# Patient Record
Sex: Female | Born: 1940 | Race: Black or African American | Hispanic: No | Marital: Married | Smoking: Current every day smoker
Health system: Southern US, Community
[De-identification: ages and names within clinical notes are randomized; demographics above are authoritative.]

## PROBLEM LIST (undated history)

## (undated) DIAGNOSIS — M542 Cervicalgia: Secondary | ICD-10-CM

## (undated) DIAGNOSIS — M6282 Rhabdomyolysis: Secondary | ICD-10-CM

## (undated) DIAGNOSIS — N179 Acute kidney failure, unspecified: Secondary | ICD-10-CM

## (undated) DIAGNOSIS — K319 Disease of stomach and duodenum, unspecified: Secondary | ICD-10-CM

## (undated) DIAGNOSIS — M549 Dorsalgia, unspecified: Secondary | ICD-10-CM

## (undated) DIAGNOSIS — I10 Essential (primary) hypertension: Secondary | ICD-10-CM

## (undated) DIAGNOSIS — I219 Acute myocardial infarction, unspecified: Secondary | ICD-10-CM

## (undated) DIAGNOSIS — C539 Malignant neoplasm of cervix uteri, unspecified: Secondary | ICD-10-CM

## (undated) DIAGNOSIS — E785 Hyperlipidemia, unspecified: Secondary | ICD-10-CM

## (undated) DIAGNOSIS — Z923 Personal history of irradiation: Secondary | ICD-10-CM

## (undated) DIAGNOSIS — I509 Heart failure, unspecified: Secondary | ICD-10-CM

## (undated) DIAGNOSIS — J449 Chronic obstructive pulmonary disease, unspecified: Secondary | ICD-10-CM

## (undated) DIAGNOSIS — I2510 Atherosclerotic heart disease of native coronary artery without angina pectoris: Secondary | ICD-10-CM

## (undated) DIAGNOSIS — K219 Gastro-esophageal reflux disease without esophagitis: Secondary | ICD-10-CM

## (undated) DIAGNOSIS — G3184 Mild cognitive impairment, so stated: Secondary | ICD-10-CM

## (undated) HISTORY — PX: ABDOMINAL HYSTERECTOMY: SHX81

## (undated) HISTORY — PX: SHOULDER SURGERY: SHX246

## (undated) HISTORY — DX: Disease of stomach and duodenum, unspecified: K31.9

## (undated) HISTORY — DX: Hyperlipidemia, unspecified: E78.5

## (undated) HISTORY — PX: KNEE SURGERY: SHX244

## (undated) HISTORY — DX: Malignant neoplasm of cervix uteri, unspecified: C53.9

## (undated) HISTORY — DX: Cervicalgia: M54.2

## (undated) HISTORY — PX: CHOLECYSTECTOMY: SHX55

## (undated) HISTORY — DX: Dorsalgia, unspecified: M54.9

## (undated) HISTORY — DX: Essential (primary) hypertension: I10

---

## 1998-03-09 ENCOUNTER — Emergency Department (HOSPITAL_COMMUNITY): Admission: EM | Admit: 1998-03-09 | Discharge: 1998-03-09 | Payer: Self-pay | Admitting: Emergency Medicine

## 1998-12-19 ENCOUNTER — Emergency Department (HOSPITAL_COMMUNITY): Admission: EM | Admit: 1998-12-19 | Discharge: 1998-12-19 | Payer: Self-pay | Admitting: Emergency Medicine

## 1998-12-19 ENCOUNTER — Encounter: Payer: Self-pay | Admitting: Emergency Medicine

## 1999-01-11 ENCOUNTER — Encounter: Payer: Self-pay | Admitting: Orthopedic Surgery

## 1999-01-12 ENCOUNTER — Observation Stay (HOSPITAL_COMMUNITY): Admission: RE | Admit: 1999-01-12 | Discharge: 1999-01-13 | Payer: Self-pay | Admitting: Orthopedic Surgery

## 1999-01-18 ENCOUNTER — Encounter: Payer: Self-pay | Admitting: Emergency Medicine

## 1999-01-18 ENCOUNTER — Emergency Department (HOSPITAL_COMMUNITY): Admission: EM | Admit: 1999-01-18 | Discharge: 1999-01-18 | Payer: Self-pay | Admitting: Emergency Medicine

## 1999-06-16 HISTORY — PX: CARDIAC CATHETERIZATION: SHX172

## 1999-06-17 ENCOUNTER — Encounter: Payer: Self-pay | Admitting: Orthopedic Surgery

## 1999-06-17 ENCOUNTER — Ambulatory Visit: Admission: RE | Admit: 1999-06-17 | Discharge: 1999-06-17 | Payer: Self-pay | Admitting: Orthopedic Surgery

## 1999-06-27 ENCOUNTER — Ambulatory Visit (HOSPITAL_COMMUNITY): Admission: RE | Admit: 1999-06-27 | Discharge: 1999-06-27 | Payer: Self-pay | Admitting: *Deleted

## 1999-08-02 ENCOUNTER — Encounter: Payer: Self-pay | Admitting: Orthopedic Surgery

## 1999-08-09 ENCOUNTER — Inpatient Hospital Stay (HOSPITAL_COMMUNITY): Admission: RE | Admit: 1999-08-09 | Discharge: 1999-08-13 | Payer: Self-pay | Admitting: Orthopedic Surgery

## 1999-09-05 ENCOUNTER — Encounter: Admission: RE | Admit: 1999-09-05 | Discharge: 1999-10-13 | Payer: Self-pay | Admitting: Orthopedic Surgery

## 2000-10-25 ENCOUNTER — Encounter (INDEPENDENT_AMBULATORY_CARE_PROVIDER_SITE_OTHER): Payer: Self-pay | Admitting: Specialist

## 2000-10-25 ENCOUNTER — Ambulatory Visit (HOSPITAL_COMMUNITY): Admission: RE | Admit: 2000-10-25 | Discharge: 2000-10-25 | Payer: Self-pay | Admitting: *Deleted

## 2000-11-09 ENCOUNTER — Emergency Department (HOSPITAL_COMMUNITY): Admission: EM | Admit: 2000-11-09 | Discharge: 2000-11-09 | Payer: Self-pay | Admitting: Emergency Medicine

## 2000-11-10 ENCOUNTER — Encounter: Payer: Self-pay | Admitting: Emergency Medicine

## 2001-01-31 ENCOUNTER — Emergency Department (HOSPITAL_COMMUNITY): Admission: EM | Admit: 2001-01-31 | Discharge: 2001-01-31 | Payer: Self-pay

## 2001-01-31 ENCOUNTER — Encounter: Payer: Self-pay | Admitting: Emergency Medicine

## 2001-10-30 ENCOUNTER — Encounter: Payer: Self-pay | Admitting: Cardiology

## 2001-10-30 ENCOUNTER — Encounter: Admission: RE | Admit: 2001-10-30 | Discharge: 2001-10-30 | Payer: Self-pay | Admitting: Cardiology

## 2002-06-02 ENCOUNTER — Ambulatory Visit (HOSPITAL_COMMUNITY): Admission: RE | Admit: 2002-06-02 | Discharge: 2002-06-02 | Payer: Self-pay | Admitting: Family Medicine

## 2002-12-31 ENCOUNTER — Encounter (INDEPENDENT_AMBULATORY_CARE_PROVIDER_SITE_OTHER): Payer: Self-pay | Admitting: Specialist

## 2002-12-31 ENCOUNTER — Ambulatory Visit (HOSPITAL_COMMUNITY): Admission: RE | Admit: 2002-12-31 | Discharge: 2002-12-31 | Payer: Self-pay | Admitting: *Deleted

## 2003-01-07 ENCOUNTER — Encounter (INDEPENDENT_AMBULATORY_CARE_PROVIDER_SITE_OTHER): Payer: Self-pay | Admitting: *Deleted

## 2003-01-07 ENCOUNTER — Encounter: Payer: Self-pay | Admitting: General Surgery

## 2003-01-07 ENCOUNTER — Inpatient Hospital Stay (HOSPITAL_COMMUNITY): Admission: AD | Admit: 2003-01-07 | Discharge: 2003-01-14 | Payer: Self-pay | Admitting: *Deleted

## 2003-01-08 ENCOUNTER — Encounter (INDEPENDENT_AMBULATORY_CARE_PROVIDER_SITE_OTHER): Payer: Self-pay | Admitting: Specialist

## 2003-05-16 HISTORY — PX: PARTIAL GASTRECTOMY: SHX6003

## 2004-05-03 ENCOUNTER — Encounter: Admission: RE | Admit: 2004-05-03 | Discharge: 2004-05-03 | Payer: Self-pay | Admitting: Internal Medicine

## 2004-09-15 ENCOUNTER — Encounter: Admission: RE | Admit: 2004-09-15 | Discharge: 2004-09-15 | Payer: Self-pay | Admitting: Internal Medicine

## 2005-03-07 ENCOUNTER — Encounter: Admission: RE | Admit: 2005-03-07 | Discharge: 2005-03-07 | Payer: Self-pay | Admitting: Internal Medicine

## 2005-11-10 ENCOUNTER — Encounter: Admission: RE | Admit: 2005-11-10 | Discharge: 2005-11-10 | Payer: Self-pay | Admitting: Internal Medicine

## 2006-01-20 ENCOUNTER — Emergency Department (HOSPITAL_COMMUNITY): Admission: EM | Admit: 2006-01-20 | Discharge: 2006-01-20 | Payer: Self-pay | Admitting: Emergency Medicine

## 2006-02-06 ENCOUNTER — Inpatient Hospital Stay (HOSPITAL_COMMUNITY): Admission: RE | Admit: 2006-02-06 | Discharge: 2006-02-11 | Payer: Self-pay | Admitting: Orthopedic Surgery

## 2006-02-07 ENCOUNTER — Ambulatory Visit: Payer: Self-pay | Admitting: Physical Medicine & Rehabilitation

## 2006-02-19 ENCOUNTER — Encounter: Payer: Self-pay | Admitting: Vascular Surgery

## 2006-02-19 ENCOUNTER — Ambulatory Visit (HOSPITAL_COMMUNITY): Admission: RE | Admit: 2006-02-19 | Discharge: 2006-02-19 | Payer: Self-pay | Admitting: Internal Medicine

## 2006-03-12 ENCOUNTER — Encounter: Admission: RE | Admit: 2006-03-12 | Discharge: 2006-03-12 | Payer: Self-pay | Admitting: Internal Medicine

## 2006-03-27 ENCOUNTER — Encounter: Admission: RE | Admit: 2006-03-27 | Discharge: 2006-03-27 | Payer: Self-pay | Admitting: Internal Medicine

## 2006-05-29 ENCOUNTER — Inpatient Hospital Stay (HOSPITAL_COMMUNITY): Admission: RE | Admit: 2006-05-29 | Discharge: 2006-06-01 | Payer: Self-pay | Admitting: Orthopedic Surgery

## 2006-05-30 ENCOUNTER — Encounter: Payer: Self-pay | Admitting: Vascular Surgery

## 2006-11-13 ENCOUNTER — Encounter: Admission: RE | Admit: 2006-11-13 | Discharge: 2006-11-13 | Payer: Self-pay | Admitting: Internal Medicine

## 2007-09-29 ENCOUNTER — Emergency Department (HOSPITAL_COMMUNITY): Admission: EM | Admit: 2007-09-29 | Discharge: 2007-09-29 | Payer: Self-pay | Admitting: Emergency Medicine

## 2007-11-03 ENCOUNTER — Emergency Department (HOSPITAL_COMMUNITY): Admission: EM | Admit: 2007-11-03 | Discharge: 2007-11-04 | Payer: Self-pay | Admitting: Emergency Medicine

## 2007-11-12 ENCOUNTER — Encounter: Admission: RE | Admit: 2007-11-12 | Discharge: 2007-11-12 | Payer: Self-pay | Admitting: Orthopedic Surgery

## 2007-11-26 ENCOUNTER — Encounter: Admission: RE | Admit: 2007-11-26 | Discharge: 2007-11-26 | Payer: Self-pay | Admitting: Orthopedic Surgery

## 2007-12-11 ENCOUNTER — Encounter: Admission: RE | Admit: 2007-12-11 | Discharge: 2007-12-11 | Payer: Self-pay | Admitting: Internal Medicine

## 2007-12-24 ENCOUNTER — Emergency Department (HOSPITAL_COMMUNITY): Admission: EM | Admit: 2007-12-24 | Discharge: 2007-12-24 | Payer: Self-pay | Admitting: Infectious Diseases

## 2007-12-27 ENCOUNTER — Ambulatory Visit (HOSPITAL_COMMUNITY): Admission: RE | Admit: 2007-12-27 | Discharge: 2007-12-27 | Payer: Self-pay | Admitting: Emergency Medicine

## 2008-02-11 ENCOUNTER — Inpatient Hospital Stay (HOSPITAL_COMMUNITY): Admission: RE | Admit: 2008-02-11 | Discharge: 2008-02-15 | Payer: Self-pay | Admitting: Internal Medicine

## 2008-04-29 ENCOUNTER — Ambulatory Visit: Payer: Self-pay | Admitting: Vascular Surgery

## 2008-04-29 ENCOUNTER — Encounter (INDEPENDENT_AMBULATORY_CARE_PROVIDER_SITE_OTHER): Payer: Self-pay | Admitting: Orthopedic Surgery

## 2008-04-29 ENCOUNTER — Ambulatory Visit: Admission: RE | Admit: 2008-04-29 | Discharge: 2008-04-29 | Payer: Self-pay | Admitting: Orthopedic Surgery

## 2009-06-03 ENCOUNTER — Encounter: Admission: RE | Admit: 2009-06-03 | Discharge: 2009-06-03 | Payer: Self-pay | Admitting: Orthopedic Surgery

## 2009-06-23 ENCOUNTER — Encounter: Admission: RE | Admit: 2009-06-23 | Discharge: 2009-06-23 | Payer: Self-pay | Admitting: Orthopedic Surgery

## 2010-03-14 ENCOUNTER — Emergency Department (HOSPITAL_COMMUNITY): Admission: EM | Admit: 2010-03-14 | Discharge: 2010-03-15 | Payer: Self-pay | Admitting: Emergency Medicine

## 2010-04-28 ENCOUNTER — Emergency Department (HOSPITAL_COMMUNITY)
Admission: EM | Admit: 2010-04-28 | Discharge: 2010-04-28 | Payer: Self-pay | Source: Home / Self Care | Admitting: Emergency Medicine

## 2010-04-29 ENCOUNTER — Ambulatory Visit (HOSPITAL_COMMUNITY)
Admission: RE | Admit: 2010-04-29 | Discharge: 2010-04-29 | Payer: Self-pay | Source: Home / Self Care | Attending: Internal Medicine | Admitting: Internal Medicine

## 2010-05-15 HISTORY — PX: NECK SURGERY: SHX720

## 2010-05-15 HISTORY — PX: BACK SURGERY: SHX140

## 2010-05-17 ENCOUNTER — Ambulatory Visit
Admission: RE | Admit: 2010-05-17 | Discharge: 2010-05-17 | Payer: Self-pay | Source: Home / Self Care | Attending: Specialist | Admitting: Specialist

## 2010-05-17 ENCOUNTER — Other Ambulatory Visit: Payer: Self-pay | Admitting: Specialist

## 2010-06-02 HISTORY — PX: TRANSTHORACIC ECHOCARDIOGRAM: SHX275

## 2010-06-15 ENCOUNTER — Encounter (HOSPITAL_COMMUNITY): Payer: Medicare Other | Attending: Specialist

## 2010-06-15 DIAGNOSIS — Z01812 Encounter for preprocedural laboratory examination: Secondary | ICD-10-CM | POA: Insufficient documentation

## 2010-06-17 ENCOUNTER — Inpatient Hospital Stay (HOSPITAL_COMMUNITY)
Admission: RE | Admit: 2010-06-17 | Discharge: 2010-06-20 | DRG: 473 | Disposition: A | Discharge status: Home Health | Payer: MEDICARE | Source: Ambulatory Visit | Attending: Specialist | Admitting: Specialist

## 2010-06-17 ENCOUNTER — Ambulatory Visit (HOSPITAL_COMMUNITY): Payer: MEDICARE

## 2010-06-17 DIAGNOSIS — L723 Sebaceous cyst: Secondary | ICD-10-CM | POA: Diagnosis present

## 2010-06-17 DIAGNOSIS — I10 Essential (primary) hypertension: Secondary | ICD-10-CM | POA: Diagnosis present

## 2010-06-17 DIAGNOSIS — M129 Arthropathy, unspecified: Secondary | ICD-10-CM | POA: Diagnosis present

## 2010-06-17 DIAGNOSIS — Z96659 Presence of unspecified artificial knee joint: Secondary | ICD-10-CM

## 2010-06-17 DIAGNOSIS — M4712 Other spondylosis with myelopathy, cervical region: Principal | ICD-10-CM | POA: Diagnosis present

## 2010-06-17 DIAGNOSIS — F17200 Nicotine dependence, unspecified, uncomplicated: Secondary | ICD-10-CM | POA: Diagnosis present

## 2010-06-17 LAB — PROTIME-INR
INR: 0.93 (ref 0.00–1.49)
Prothrombin Time: 12.7 seconds (ref 11.6–15.2)

## 2010-06-17 LAB — DIFFERENTIAL
Basophils Absolute: 0 10*3/uL (ref 0.0–0.1)
Basophils Relative: 0 % (ref 0–1)
Eosinophils Absolute: 0.1 10*3/uL (ref 0.0–0.7)
Eosinophils Relative: 2 % (ref 0–5)
Lymphocytes Relative: 32 % (ref 12–46)
Lymphs Abs: 2 10*3/uL (ref 0.7–4.0)
Monocytes Absolute: 0.7 10*3/uL (ref 0.1–1.0)
Monocytes Relative: 10 % (ref 3–12)
Neutro Abs: 3.6 10*3/uL (ref 1.7–7.7)
Neutrophils Relative %: 56 % (ref 43–77)

## 2010-06-17 LAB — COMPREHENSIVE METABOLIC PANEL
ALT: 15 U/L (ref 0–35)
AST: 18 U/L (ref 0–37)
Albumin: 3.4 g/dL — ABNORMAL LOW (ref 3.5–5.2)
Alkaline Phosphatase: 73 U/L (ref 39–117)
BUN: 12 mg/dL (ref 6–23)
CO2: 22 mEq/L (ref 19–32)
Calcium: 9.5 mg/dL (ref 8.4–10.5)
Chloride: 111 mEq/L (ref 96–112)
Creatinine, Ser: 0.8 mg/dL (ref 0.4–1.2)
GFR calc Af Amer: >60 mL/min (ref >60)
GFR calc non Af Amer: >60 mL/min (ref >60)
Glucose, Bld: 97 mg/dL (ref 70–99)
Potassium: 4.5 mEq/L (ref 3.5–5.1)
Sodium: 141 mEq/L (ref 135–145)
Total Bilirubin: 0.3 mg/dL (ref 0.3–1.2)
Total Protein: 5.9 g/dL — ABNORMAL LOW (ref 6.0–8.3)

## 2010-06-17 LAB — CBC
HCT: 32.2 % — ABNORMAL LOW (ref 36.0–46.0)
Hemoglobin: 10.2 g/dL — ABNORMAL LOW (ref 12.0–15.0)
MCH: 30.8 pg (ref 26.0–34.0)
MCHC: 31.7 g/dL (ref 30.0–36.0)
MCV: 97.3 fL (ref 78.0–100.0)
Platelets: 333 10*3/uL (ref 150–400)
RBC: 3.31 MIL/uL — ABNORMAL LOW (ref 3.87–5.11)
RDW: 12.9 % (ref 11.5–15.5)
WBC: 6.4 10*3/uL (ref 4.0–10.5)

## 2010-06-17 LAB — SURGICAL PCR SCREEN
MRSA, PCR: NEGATIVE
Staphylococcus aureus: NEGATIVE

## 2010-06-26 NOTE — Op Note (Signed)
NAME:  Sara Fox, Sara Fox                ACCOUNT NO.:  [id number]  MEDICAL RECORD NO.:  [id number]           PATIENT TYPE:  I  LOCATION:  5011                         FACILITY:  [facility]  PHYSICIAN:  Kerrin Champagne, M.D.   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  C5 distribution of anesthesia about the arms.  POSTOPERATIVE DIAGNOSES: 1. Cervical spinal stenosis C3-C4 and C4-C5 with cord edema changes     and myelopathy.  C5 distribution of anesthesia about he arms. 2. Suspected sebaceous cyst, not infected over the anterior aspect of     the neck.  PROCEDURE:  Anterior approach of the cervical spine with corpectomy at the C4 level with excision of disk at C4-C5 and C5-C6 with decompression of the cervical cord via anterior corpectomy utilizing the operating room microscope.  Allograft, tricortical iliac crest strut graft from C3 through C5 with internal fixation with a 38-mm length of Alphatec 6-hole plate with four 14-mm screws.  Local bone graft was applied.  The patient also had excision of a benign skin lesion over the anterior cervical spine at about the C6-C7 level, presumably a sebaceous cyst.  SURGEON:  Kerrin Champagne, MD  ASSISTANT:  [name], PA-C.  ANESTHESIA:  General via orotracheal intubation, Dr. Diamantina Monks, supplemented with local infiltration at the cervical incision site and at the area of excision of skin lesion using Marcaine 0.5% with 1:200,000 epinephrine, total of 15 mL.  ESTIMATED BLOOD LOSS:  Less than 100 mL.  DRAINS:  A 10-French TLS drains in over the anterior cervical spine. Foley to straight drain.  COMPLICATIONS:  None.  BRIEF CLINICAL HISTORY:  The patient is a 70 year old female who has been experiencing numbness into her upper extremities over the  last 3-4 months.  Two months ago, she began experiencing severe pain in her back, radiation to the right lower extremity.  She has had history of previous right upper extremity radial nerve palsy and was treated for this with resolution nearly a year and a half ago.  At that time, MRI scan was obtained that demonstrated severe cervical stenosis at C3-C4 and C4-C5 with protruded disk at the C6-C7 level.  Her clinical findings, however, were significant for right-sided radial nerve palsy and she eventually went on to recover her radial nerve function.  Two months ago, her pain was into her lower back and radiation into her right lower extremity greater than left with severe S1 radiculopathy.  She underwent attempts at epidural steroid injections.  These have been unsuccessful in relieving the pain.  MRI scan was done which demonstrated disk protrusion, focal to the right side at the L5-S1 level.  She was felt to be a candidate to undergo microdiskectomy, however, with persistence of numbness in her arms and knowledge of previous cervical spinal stenosis, repeat MRI scan demonstrated her stenosis to be worse with findings of cord edema, myelopathy.  Clinically, she has numbness in both  her arms. She has difficulty standing and ambulating and is developing early myelopathy.  She is brought to the operating room to undergo a corpectomy at the C4 level for decompression on the cervical cord both at the C3-C4 and C4-C5 levels with strut graft with tricortical iliac crest allograft.  Plate and screw fixation.  The patient is aware of the procedures.  The risks involved risks of infection, bleeding, risk of cord injury.  She understands the risks and signed informed consent. She also was noted to have a sebaceous cyst over the anterior cervical spine.  This was at about C6 level above the area of excision.  It was felt that this should be excised and removed after performance of her cervical spine  surgery.  DESCRIPTION OF PROCEDURE:  This patient was seen in the preoperative holding area.  Discussion at that time, all questions were answered. She signed informed consent.  She received standard preoperative antibiotics, 2 g of Ancef.  I signed ink on the left side of the neck with an X and my initials area of the lesion over the anterior neck. The area for the expected incision over the anterior cervical spine at the C4 level was also marked within the skin crease.  She was taken to the operating room via a stretcher, OR room #[facility] at [facility]. There, she underwent induction of general anesthetic on the OR table, [place] horseshoe.  She had a Foley catheter placed following induction of general anesthesia, intubation was atraumatic.  She was positioned, neck in neutral position with 5-pound cervical halter traction in neutral position.  No bolsters.  The arms were well padded at the site of the skids, holding the arms on the table well padded.  Lower extremities in PAS hose and were also padded areas of prominence.  She was placed into beach-chair position.  Prepped with DuraPrep solution and draped in the usual manner.  Iodine Vi-Drape was used down to the area of sebaceous cyst was prepped as well and a sterile small Tegaderm placed over this area in order to double drape it out of the field.  A time-out protocol was carried out identifying the patient, the level of procedure, and expected length of time.  An incision made in the anterior aspect of the neck at the expected C4 level in line with the patient's skin creases approximately 3-3.5 inches in length through skin and subcutaneous layers.  Small superficial vein was cauterized using bipolar electrocautery.  The platysma layer was incised in line with the skin incision developed to allow for reapproximation at the end of the case using electrocautery.  Metzenbaum scissors was then used to spread the superficial  fascial layer.  A large superficial vein identified likely representing an external jugular vein.  This was carefully dissected and mobilized laterally.  The dissection carried medial to this area and anterior to the patient's carotid sheath, exposing the anterior aspect of the cervical spine.  Dissection was carried to the prevertebral fascia.  This was then incised with a 15 blade scalpel on the medial border of the longus colli muscle and teased across the midline with Pension scheme manager.  At this level, large spurs likely representing the C4-C5 level were identified.  An 18-gauge spinal needle was inserted, 1 cm of the needle protruding beyond the sheath.  Exposing superiorly, a second needle was placed at the expected C3-C4 level. Intraoperative lateral radiograph demonstrated the needles at C4-C5 and at C3-C4.  Under direct observation, needles were  carefully removed using a handheld Cloward to retract the esophagus immediately along the sheath laterally.  The upper needle was removed first and then the lower needle.  Lower needle removed and incision made into lower disk using a 15-blade scalpel.  Pituitary rongeur was used to debride disk material here.  Dissection was carried more inferiorly to expose the C5 vertebral body.  In order to perform this, omohyoid muscles, 2 large muscle bellies were identified, carefully freed up and held with a right-angle clamp and divided using electrocautery.  Interval was then able to be developed inferiorly to the C5 and the lower end of C5-C6.  With this then, the trachea and esophagus were more easily mobilized medially and the anterior aspect of cervical spine was carefully exposed over the C5 vertebral body, C4 vertebral body, and the lower half of C3.  Medial aspect of longus colli muscle was carefully freed up using Key elevator and the electrocautery was used to carefully mobilize further longus colli muscle at the C4 level both sides.   Boss McCullough retractor was inserted at the level of the C4 vertebral body in the medial aspect of the blade, placed beneath the medial border of longus colli muscle to allow for retraction of longus colli muscle primarily.  Loupe magnification and headlamp used during this portion of procedure.  A Leksell rongeur was used then to resect the central portions of the vertebral bodies of C4 after first incising the disk at the C3-C4 level and excising using pituitary rongeurs back to the posterior aspect of the disk space at C3-C4 and posterior aspect of the disk space at C4-C5. Then, the operating room microscope was then brought into the field, sterilely draped, and care was taken to ensure that the scope was centered over the cervical spine with the patient's body in sagittal plane.  With this then, a high-speed bur was used to carefully thin the posterior aspect of the vertebral body of C4 back to the cortical bone representing the posterior cortex of the vertebral body of C4, care taken to remain in the midline.  A sounder that is normally used for sounding the disk space for allograft off the Alphatec set was used to carefully measure the trough made centrally and measured well over 15 mm at about 16-17 mm.  It was capped at the sides from C3-C4, C4-C5.  The microscope was used to carefully evaluate the extent of the posteriorremoval of bone so that areas with cortical bone were thin.  A 3.0 micro curette was able to be used to remove a portion of bone to allow for introduction of 1-mm Kerrison and then using 1-mm Kerrisons, the cortical bone over the central portion of the vertebral body were carefully resected.  This was extended laterally and then up the lateral aspects of the cervical canal up to the C3-C4 level down to the C4-C5 level.  Removing the osteophytic bar, the posterior upper aspect of the C4 vertebral body and removing this from right to left.  Then, continuing  inferiorly resecting the bar over the posteroinferior aspect of the C4 vertebral body and resecting additionally any annular material at the C3-C4 and C4-C5 levels.  A rent in the posterior annulus noted at C3-C4 and old remnants of herniated disk material were resected that were pressing upon the cord at C3-C4 and these were completely resected, decompressing the C3-C4 level quite nicely.  A high-speed bur was then used to carefully remove any residual cartilaginous bone over the inferior  aspect of C4 and superior aspect of C5 taking this back to the posterior lip of the posteroinferior aspect of C3 and the posterosuperior aspect of C5.  These were carefully thinned and then 1- mm and 2-mm Kerrisons were used to resect the small bridge remaining over the inferior aspect of the posterior portion of C3 and the superior aspect of the posterior portion of C5.  Using the operating room microscope, carefully aligning likely then foraminotomies were performed on the right side at C4-C5, then at C3-C4.  This was also performed and on the left side at C3-C4 and C4-C5.  Combination of 1-mm and 2-mm Kerrisons as well as micro curettes were used to debride the foramen, relieving the nerve compression related to uncovertebral spurs at both these levels.  Note that a 14-mm screw post had been placed into the vertebral body of C3 and of C5 for retraction at the beginning of the case prior to the beginning of the corpectomy.  The length was maintained at these segments during the corpectomy and during the decompression phase.  The site of corpectomy space extending from the inferior aspect of C3 to the superior aspect of C5 was carefully measured and a portion of paper used for sterile gloves was carefully made to the size of the opening for the corpectomy.  This was measured at about 23 mm.  A depth gauge was used to measure the depth of C3-C4, C5-C6 and about 15- 18 mm was noted.  The tricortical iliac  crest allograft was then carefully charged with normal saline solution.  Measurement and 23 mm past the depth of 13 mm was made on this graft using a tricortical graft.  A mini-oscillating saw was then used to divide the graft cortex and the piece carefully observed to be proper length, slightly greater than 23 mm.  The edges of the cortical bone on both the upper aspect of the graft and lower aspect of the graft was carefully then removed using a high-speed bur to allow for cancellus bone to remain so that the graft could be keyed into the opening.  Graft was quite straight.  I then performed a slight distraction using the [employer] retractor.  At one or two clicks, additional traction was obtained and then the graft carefully positioned after first irrigating and obtaining hemostasis using a combination of FloSeal and areas of bone wax along the lateral aspects of the trough.  Graft was then carefully positioned over the intervertebral disk space and then positioned such that engaged the upper superficial cortex of the C5 vertebra and then was wedged superiorly and then impacted using a bone tamp superiorly and inferiorly, carefully working the graft into the space between the inferior aspect of C3 and the superior aspect of C5.  The graft was able to be placed quite nicely with the cortical portion of the graft superficially against the cortex, the inferior superficial aspect of C3 and the superior superficial aspect of C5.  The graft was minimally further impacted to allow for the graft to be flushed with these surfaces.  Screw posts were then removed and traction then removed off this patient's cervical spine.  Measuring the distance between the inferior aspect of C3 and the superior aspect of C5, a 38-mm plate was chosen.  This was placed in the ventral aspect of the cervical spine following careful smoothing of the inferior aspect of C3 and superior aspect of C5.  The plate  was able to be placed then  and a single retaining pin placed on the left side at the inferior aspect of C3. First screw was then placed over the left side at the C5 level and a drill hole placed using 14-mm drill and then 14-mm screw was placed.  A second drill was then placed on the right side at the C5 level and a drill used to drill 14-mm screw hole and a second screw was able to be placed, fixing both the inferior screws and plate.  On the right side at C3, then drill hole was placed and a 14-mm screw was placed and fixed the upper aspect of the plate.  A retaining pin was then removed and a drill hole then placed on the left side at C3 and then 14-mm screw placed to obtain an excellent fixation across the C3 through C5 levels. Intraoperative lateral radiograph demonstrated plates and screws in good position and alignment.  No evidence of retropulsion of graft or any evidence of screws extending beyond the posterior cortex of the vertebra C3 or C5.  Irrigation was carried out multiple times.  The patient was given an additional Ancef as she had been almost 5 hours since her first delivery of Ancef.  A 10-French TLS drain was placed in the depth of the incision, exiting over the anterior aspect of the neck and below the incision site.  The omohyoid muscle was reapproximated with interrupted 2-0 Vicryl sutures.  The patient then had the platysma layer reapproximated with interrupted 2-0 and 3-0 Vicryl sutures, subcu layers approximated with interrupted 3-0 Vicryl sutures, and skin closed with a running subcu stitch of 4-0 Vicryl.  Dermabond was then applied.  With this then, the TLS 10-French drain was sewn into place with 4-0 nylon stitch.  Dressing was then removed following the application of Dermabond and the presumed sebaceous cysts were benign, skin lesion was identified, measured almost 1.5 cm in diameter.  An elliptical incision was made about the base of skin lesion, carried  down to the superficial fascial layer, but not violating the superficial fascia.  Base was excised and lesion removed with this ellipse of skin.  Irrigation was carried out and subcu layers approximated with interrupted 3-0 Vicryl sutures and then Dermabond was applied, fixing the skin.  The patient then had Mepilex bandage applied.  Philadelphia collar was applied.  The patient was then reactivated, extubated, returned to recovery room in satisfactory condition.  All instrument and sponge counts were correct.  PHYSICIAN ASSISTANT'S RESPONSIBILITY:  Maud Deed performed duties of Designer, television/film set.  She was present from the beginning of the case to the end of the case.  She performed careful suctioning out extremely delicate neural structures, cervical cord, and cervical nerve roots. She assisted with application of bone wax to areas close to area of the open site for the cord.  She performed closure of the incision from the platysma layer to the skin.  At the end of the case, she applied bandages as well as a Philadelphia collar.  Without the physician assistant's assistance in this case, I do not think the case is possible.  SPECIMENS:  The patient had a skin lesion, benign, suspected sebaceous cyst, sent for pathologic diagnosis.     Kerrin Champagne, M.D.     JEN/MEDQ  D:  06/19/2010  T:  06/20/2010  Job:  [id number]  Electronically Signed by Vira Browns M.D. on 06/23/2010 08:27:17 PM

## 2010-07-28 ENCOUNTER — Other Ambulatory Visit (HOSPITAL_COMMUNITY): Payer: Medicare Other

## 2010-07-29 ENCOUNTER — Encounter (HOSPITAL_COMMUNITY)
Admission: RE | Admit: 2010-07-29 | Discharge: 2010-07-29 | Disposition: A | Discharge status: Home / Self Care | Payer: Medicare Other | Source: Ambulatory Visit | Attending: Specialist | Admitting: Specialist

## 2010-07-29 DIAGNOSIS — Z01812 Encounter for preprocedural laboratory examination: Secondary | ICD-10-CM | POA: Insufficient documentation

## 2010-07-29 LAB — TYPE AND SCREEN
ABO/RH(D): O POS
Antibody Screen: NEGATIVE

## 2010-07-29 LAB — CBC
HCT: 34.2 % — ABNORMAL LOW (ref 36.0–46.0)
Hemoglobin: 10.9 g/dL — ABNORMAL LOW (ref 12.0–15.0)
MCH: 31.1 pg (ref 26.0–34.0)
MCHC: 31.9 g/dL (ref 30.0–36.0)
MCV: 97.4 fL (ref 78.0–100.0)
Platelets: 376 10*3/uL (ref 150–400)
RBC: 3.51 MIL/uL — ABNORMAL LOW (ref 3.87–5.11)
RDW: 13.9 % (ref 11.5–15.5)
WBC: 5.4 10*3/uL (ref 4.0–10.5)

## 2010-07-29 LAB — COMPREHENSIVE METABOLIC PANEL
ALT: 11 U/L (ref 0–35)
AST: 18 U/L (ref 0–37)
Albumin: 3.8 g/dL (ref 3.5–5.2)
Alkaline Phosphatase: 77 U/L (ref 39–117)
BUN: 13 mg/dL (ref 6–23)
CO2: 26 mEq/L (ref 19–32)
Calcium: 9.3 mg/dL (ref 8.4–10.5)
Chloride: 116 mEq/L — ABNORMAL HIGH (ref 96–112)
Creatinine, Ser: 1.01 mg/dL (ref 0.4–1.2)
GFR calc Af Amer: >60 mL/min (ref >60)
GFR calc non Af Amer: 54 mL/min — ABNORMAL LOW (ref >60)
Glucose, Bld: 94 mg/dL (ref 70–99)
Potassium: 4.6 mEq/L (ref 3.5–5.1)
Sodium: 146 mEq/L — ABNORMAL HIGH (ref 135–145)
Total Bilirubin: 0.5 mg/dL (ref 0.3–1.2)
Total Protein: 6.5 g/dL (ref 6.0–8.3)

## 2010-07-29 LAB — PROTIME-INR
INR: 0.88 (ref 0.00–1.49)
Prothrombin Time: 12.1 seconds (ref 11.6–15.2)

## 2010-07-29 LAB — DIFFERENTIAL
Basophils Absolute: 0 10*3/uL (ref 0.0–0.1)
Basophils Relative: 0 % (ref 0–1)
Eosinophils Absolute: 0.1 10*3/uL (ref 0.0–0.7)
Eosinophils Relative: 2 % (ref 0–5)
Lymphocytes Relative: 34 % (ref 12–46)
Lymphs Abs: 1.8 10*3/uL (ref 0.7–4.0)
Monocytes Absolute: 0.5 10*3/uL (ref 0.1–1.0)
Monocytes Relative: 10 % (ref 3–12)
Neutro Abs: 2.9 10*3/uL (ref 1.7–7.7)
Neutrophils Relative %: 53 % (ref 43–77)

## 2010-07-29 LAB — URINALYSIS, ROUTINE W REFLEX MICROSCOPIC
Bilirubin Urine: NEGATIVE
Glucose, UA: NEGATIVE mg/dL
Hgb urine dipstick: NEGATIVE
Ketones, ur: NEGATIVE mg/dL
Nitrite: NEGATIVE
Protein, ur: NEGATIVE mg/dL
Specific Gravity, Urine: 1.027 (ref 1.005–1.030)
Urobilinogen, UA: 1 mg/dL (ref 0.0–1.0)
pH: 6 (ref 5.0–8.0)

## 2010-07-29 LAB — SURGICAL PCR SCREEN
MRSA, PCR: NEGATIVE
Staphylococcus aureus: NEGATIVE

## 2010-08-01 ENCOUNTER — Inpatient Hospital Stay (HOSPITAL_COMMUNITY)
Admission: RE | Admit: 2010-08-01 | Discharge: 2010-08-06 | DRG: 460 | Disposition: A | Discharge status: Home Health | Payer: Medicare Other | Source: Ambulatory Visit | Attending: Specialist | Admitting: Specialist

## 2010-08-01 ENCOUNTER — Ambulatory Visit (HOSPITAL_COMMUNITY)
Admission: RE | Admit: 2010-08-01 | Discharge: 2010-08-01 | Disposition: A | Discharge status: Home / Self Care | Payer: Medicare Other | Source: Ambulatory Visit | Attending: Specialist | Admitting: Specialist

## 2010-08-01 ENCOUNTER — Inpatient Hospital Stay (HOSPITAL_COMMUNITY): Payer: Medicare Other

## 2010-08-01 ENCOUNTER — Other Ambulatory Visit (HOSPITAL_COMMUNITY): Payer: Self-pay | Admitting: Specialist

## 2010-08-01 DIAGNOSIS — Z96659 Presence of unspecified artificial knee joint: Secondary | ICD-10-CM

## 2010-08-01 DIAGNOSIS — M48061 Spinal stenosis, lumbar region without neurogenic claudication: Secondary | ICD-10-CM

## 2010-08-01 DIAGNOSIS — Z79899 Other long term (current) drug therapy: Secondary | ICD-10-CM

## 2010-08-01 DIAGNOSIS — Z01812 Encounter for preprocedural laboratory examination: Secondary | ICD-10-CM

## 2010-08-01 DIAGNOSIS — M4310 Spondylolisthesis, site unspecified: Principal | ICD-10-CM | POA: Diagnosis present

## 2010-08-01 DIAGNOSIS — M5137 Other intervertebral disc degeneration, lumbosacral region: Secondary | ICD-10-CM | POA: Diagnosis present

## 2010-08-01 DIAGNOSIS — M47812 Spondylosis without myelopathy or radiculopathy, cervical region: Secondary | ICD-10-CM | POA: Diagnosis present

## 2010-08-01 DIAGNOSIS — Z96619 Presence of unspecified artificial shoulder joint: Secondary | ICD-10-CM

## 2010-08-01 DIAGNOSIS — Q762 Congenital spondylolisthesis: Secondary | ICD-10-CM

## 2010-08-01 DIAGNOSIS — M5126 Other intervertebral disc displacement, lumbar region: Secondary | ICD-10-CM | POA: Diagnosis present

## 2010-08-01 DIAGNOSIS — I10 Essential (primary) hypertension: Secondary | ICD-10-CM | POA: Diagnosis present

## 2010-08-01 DIAGNOSIS — E785 Hyperlipidemia, unspecified: Secondary | ICD-10-CM | POA: Diagnosis present

## 2010-08-01 DIAGNOSIS — M51379 Other intervertebral disc degeneration, lumbosacral region without mention of lumbar back pain or lower extremity pain: Secondary | ICD-10-CM | POA: Diagnosis present

## 2010-08-01 DIAGNOSIS — F17200 Nicotine dependence, unspecified, uncomplicated: Secondary | ICD-10-CM | POA: Diagnosis present

## 2010-08-01 DIAGNOSIS — D62 Acute posthemorrhagic anemia: Secondary | ICD-10-CM | POA: Diagnosis not present

## 2010-08-01 DIAGNOSIS — Z981 Arthrodesis status: Secondary | ICD-10-CM

## 2010-08-02 LAB — BASIC METABOLIC PANEL
BUN: 7 mg/dL (ref 6–23)
CO2: 26 mEq/L (ref 19–32)
Calcium: 8.3 mg/dL — ABNORMAL LOW (ref 8.4–10.5)
Chloride: 110 mEq/L (ref 96–112)
Creatinine, Ser: 0.82 mg/dL (ref 0.4–1.2)
GFR calc Af Amer: >60 mL/min (ref >60)
GFR calc non Af Amer: >60 mL/min (ref >60)
Glucose, Bld: 127 mg/dL — ABNORMAL HIGH (ref 70–99)
Potassium: 4.1 mEq/L (ref 3.5–5.1)
Sodium: 140 mEq/L (ref 135–145)

## 2010-08-02 LAB — HEMOGLOBIN AND HEMATOCRIT, BLOOD
HCT: 27.6 % — ABNORMAL LOW (ref 36.0–46.0)
Hemoglobin: 9 g/dL — ABNORMAL LOW (ref 12.0–15.0)

## 2010-08-03 LAB — HEMOGLOBIN AND HEMATOCRIT, BLOOD
HCT: 24 % — ABNORMAL LOW (ref 36.0–46.0)
Hemoglobin: 7.9 g/dL — ABNORMAL LOW (ref 12.0–15.0)

## 2010-08-03 NOTE — Discharge Summary (Signed)
NAME:  Sara Fox, Sara Fox                ACCOUNT NO.:  [id number]  MEDICAL RECORD NO.:  [id number]           PATIENT TYPE:  I  LOCATION:  5011                         FACILITY:  [facility]  PHYSICIAN:  Kerrin Champagne, M.D.   DATE OF BIRTH:  10-17-1940  DATE OF ADMISSION:  06/17/2010 DATE OF DISCHARGE:  06/20/2010                              DISCHARGE SUMMARY   ADMISSION DIAGNOSES: 1. Cervical spinal stenosis C3-4 and C4-5 with cord edema changes and     myelopathy.  C5 distribution of anesthesia about the upper     extremities. 2. Status post bilateral total knee replacements. 3. Hypertension. 4. Right shoulder hemiarthroplasty. 5. Dyslipidemia. 6. Lumbar spinal stenosis.  DISCHARGE DIAGNOSES: 1. Cervical spinal stenosis C3-4 and C4-5 with cord edema changes and     myelopathy.  C5 distribution of anesthesia about the upper     extremities. 2. Status post bilateral total knee replacements. 3. Hypertension. 4. Right shoulder hemiarthroplasty. 5. Dyslipidemia. 6. Lumbar spinal stenosis. 7. Suspected sebaceous cyst not infected over the anterior aspect of     the neck.  PROCEDURE:  On June 17, 2010, the patient underwent anterior approach of the cervical spine with corpectomy at the C4 level with excision of disk at C4-5 and C5-6 with decompression of the cervical cord via anterior corpectomy utilizing operating room microscope.  Tricortical iliac crest strut graft, allograft C3-C5 with internal fixation utilizing a plate and screws.  Also, excision of a benign skin lesion over the anterior cervical spine presumably a sebaceous cyst.  This was performed by Dr. Otelia Sergeant assisted by Maud Deed, PA-C under general anesthesia.  CONSULTATIONS:  None.  BRIEF HISTORY:  The patient is a 70 year old female with numbness in both of her upper extremities over 3-4 months.  More recently, she has experienced severe pain in her back with radiation into the right lower extremity.  She has  history of previous right upper extremity radial nerve palsy and was treated for this with resolution.  More recently, she has had low back pain with radiation into the right lower extremity greater but also radiation of pain into the left lower extremity with severe S1 radiculopathy.  Epidural steroid injections have not been successful in relieving this discomfort in her lower extremities.  She has undergone an MRI scan of the lumbar spine showing a protrusion local to the right at L5-S1.  It was felt that she would benefit from a microdiskectomy, however, due to her persistent numbness in the upper extremities and knowledge of previous cervical spinal stenosis, a repeat MRI was performed.  The stenosis had become worse than her previous MRI at the C3-4 and C4-5 levels.  The patient was noted to have findings of cord edema and myelopathy.  She continued to have numbness in the upper extremities.  She developed difficulty standing and ambulating.  With these findings and symptoms in mind, it was felt she would benefit from surgical intervention for the of cervical spine for decompression, and she was admitted for this procedure.  The patient was noted to have a sebaceous cyst over the anterior cervical spine area superficially  at the neck.  It was felt that excision would be necessary to prevent cross contamination with the surgical incision.  The patient was in agreement to this procedure as well.  BRIEF HOSPITAL COURSE:  The patient tolerated the procedure under general anesthesia.  She did have improvement in her symptoms of upper extremity numbness after the procedure.  She required physical therapy to assist with ambulation and slowly was able to progress with ambulation during the hospital stay.  Prior to discharge, she was ambulating 200 feet and using a walker for assistance.  She continued to complain of severe back pain and leg pain.  An epidural steroid injection was ordered;  however, this was never completed while the patient was in the hospital.  The patient's TLS drain was discontinued on the first postoperative day.  Dressing changes after that showed the wound healing well.  The patient required IV narcotic analgesics initially for her discomfort and was weaned to p.o. analgesics without difficulty.  She was able to void following removal of her Foley catheter.  Occupational Therapy evaluated the patient and assisted her with ADLs.  The patient was afebrile and vital signs were stable throughout the hospital stay.  She did complain of sore throat as expected and lozenges and Cepacol Paul spray were utilized.  On June 20, 2010, the patient was stable for discharge to her home.  Despite the fact that an epidural steroid injection for her lumbar spine had been ordered, it had not been completed during the hospital stay.  It was felt the patient would be stable for discharge to her home with arrangements to be made for the [facility] on an outpatient basis.  Pertinent laboratory values on admission CBC within normal limits as was INR and chemistry studies.  PLAN:  The patient was discharged to her home.  She was instructed to continue using the walker for ambulation.  She will change her dressing daily or as needed.  She will wear her soft collar at all times.  She will wear her Aspen collar at all times, and she does have a Philadelphia collar to utilize for showering.  She will begin showering once the drainage has stopped completely.  She will follow up with Dr. Otelia Sergeant Tuesday, June 21, 2010, at 12:45 p.m. She will avoid overhead use of her arms.  She will continue a soft and liquid diet as long as she has a sore throat.  She is instructed to call the office should she have questions or concerns prior to her return to the office visit.  CONDITION ON DISCHARGE:  Stable.     [name], P.A.   ______________________________ Kerrin Champagne,  M.D.    SMV/MEDQ  D:  07/19/2010  T:  07/20/2010  Job:  [id number]  Electronically Signed by Dorna Mai. on 07/21/2010 09:18:57 AM Electronically Signed by Vira Browns M.D. on 08/03/2010 11:20:03 PM

## 2010-08-04 LAB — URINALYSIS, ROUTINE W REFLEX MICROSCOPIC
Bilirubin Urine: NEGATIVE
Glucose, UA: NEGATIVE mg/dL
Hgb urine dipstick: NEGATIVE
Ketones, ur: NEGATIVE mg/dL
Nitrite: NEGATIVE
Protein, ur: NEGATIVE mg/dL
Specific Gravity, Urine: 1.021 (ref 1.005–1.030)
Urobilinogen, UA: 1 mg/dL (ref 0.0–1.0)
pH: 6.5 (ref 5.0–8.0)

## 2010-08-04 LAB — HEMOGLOBIN AND HEMATOCRIT, BLOOD
HCT: 23.3 % — ABNORMAL LOW (ref 36.0–46.0)
Hemoglobin: 7.7 g/dL — ABNORMAL LOW (ref 12.0–15.0)

## 2010-08-05 LAB — HEMOGLOBIN AND HEMATOCRIT, BLOOD
HCT: 23.1 % — ABNORMAL LOW (ref 36.0–46.0)
Hemoglobin: 7.5 g/dL — ABNORMAL LOW (ref 12.0–15.0)

## 2010-08-06 LAB — URINE CULTURE: Culture  Setup Time: 201203221013

## 2010-08-22 NOTE — Op Note (Signed)
Sara Fox, Sara Fox                ACCOUNT NO.:  [id number]  MEDICAL RECORD NO.:  [id number]           PATIENT TYPE:  LOCATION:                                 FACILITY:  PHYSICIAN:  Kerrin Champagne, M.D.   DATE OF BIRTH:  21-May-1940  DATE OF PROCEDURE:  08/01/2010 DATE OF DISCHARGE:                              OPERATIVE REPORT   PREOPERATIVE DIAGNOSES: 1. Severe lumbar spinal stenosis, L4-L5 with degenerative     spondylolisthesis, grade 1-2. 2. Right-sided herniated nucleus pulposus, L5-S1 with bilateral     spondylosis, L5. 3. Degenerative disk disease, both L4-L5 and L5-S1. 4. Severe right-sided S1 radiculopathy.  PROCEDURES: 1. Gill procedure, L5 with resection of the spinous process of the     lamina and bilateral inferior articular processes of L5 for     decompression purposes and a loose neural arch at L5 due to the     spondylolysis. 2. Excision of herniated nucleus pulposus, right L5-S1. 3. Central decompressive laminectomy, L4-L5 for severe lumbar spinal     stenosis. 4. Transforaminal lumbar interbody fusion, right L5-S1 and right L4-5     with 10-mm cages x2 DePuy Concorde lordotic cages with local bone     graft. 5. Posterolateral fusion at L4-S1 utilizing local bone graft. 6. Posterior segmental instrumentation from L4 to sacrum with DePuy     variable-axis pedicle screws and rods with a single cross-link rod.  SURGEON:  Kerrin Champagne, MD  ASSISTANT:  [name], PA-C  ANESTHESIA:  General via orotracheal intubation.  Dr. Arta Bruce supplemented with local infiltration of Marcaine 0.5% with 1:200,000 epinephrine 20 mL.  ESTIMATED BLOOD LOSS:  1200 mL.  Cell Saver blood returned; 750 mL hyper hematocrit blood returned.  COMPLICATIONS:  None.  DRAINS:  Foley to straight drain and a Hemovac x1, right lower lumbar spine.  CLINICAL HISTORY:  This patient is a 70 year old female who was seen a year ago with neck pain and arm pain with a  right-sided radial nerve palsy.  This eventually resolved.  However, her studies regarding her neck showed severe cervical stenosis.  This past fall, she had sudden onset of right-sided lumbar radiculopathy which was severe.  Followup studies demonstrated a spondylolisthesis L4-L5 with severe spinal stenosis as well as the herniated disk along the right side of the L5-S1 with bilateral pars defects at this level.  The patient underwent MRI scan of her cervical spine which demonstrated severe cervical stenosis at the C3-4 and C4-5 levels with canal narrowed to approximately 4-5 mm. Because of concerns regarding cervical stenosis, she underwent first a corpectomy at the C4 level and decompression of stenosis at C4-C5 and C3- C4 with strut graft and plate and screws in the midportion of February. She has done well following the anterior cervical procedure with good relief of paresthesias she has been experiencing for a long time.  She is brought back to the operating room to undergo surgical treatment for her lumbar condition.  The preoperative studies have demonstrated persistent HNP along the right side of the L5-S1 affecting both the L5 and S1 nerve roots and  large disk fragments over the posterior aspect of the L5 vertebral body.  Also noted to have spondylolysis at the L5 level, new since MRI scan done back in 2010.  She was brought to the operating room to undergo decompression at both L4-5 and L5-S1, Gill procedure at L5, and right-sided TLIF at both L4-L5 and L5-S1, posterior lateral fusion and posterior instrumentation from L4-S1.  Possible use of master graft and bone aspiration.  Cell Saver used during this case. She understands the risks and benefits of the procedure and the expected outcome.  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  DESCRIPTION OF  PROCEDURE:  This patient was seen in the preoperative holding area.  Discussion regarding surgery.  All questions were answered.  Standard preoperative antibiotics of Ancef.  She was taken via a stretcher to the OR room #4.  There, she underwent induction of general anesthesia.  Intubation atraumatic.  Foley catheter was placed. She was then turned to a prone position.  The [place] spine frame was used.  All pressure areas were well padded.  PAS hose were used. Standard prep with DuraPrep solution from the mid-dorsal spine to the lower sacral level.  She was draped in the usual manner.  Iodine dye drape was used.  The initial incision made extending from the S1 level to approximately the L3 level.  An incision made in the skin following first time-out protocol which was carried out in a standard fashion, identifying the patient and procedure.  Incision through skin and subcu layers down to the patient's lumbodorsal fascia.  Incised on both sides of the spinous processes at S1-L5, L4-L3, and L2.  Incision carried along the posterior aspects of the spinous processes over the sacrolumbar spine, preserving facet joints at the L3-4, L2-3 levels. Kocher clamps were then placed on either side and expected spinous process of the L5.  This was found to be L4, so the incision was carried caudally an additional inch and half.  Exposure could be obtained down to the S1-S2 levels in the midline.  Cobbs were used to elevate paralumbar muscles bilaterally, exposing the posterior aspect of the lamina at the S1-S2 level.  The facets at the L5-S1 level, posterior lamina of L5 and L4, the facets of the L4-5 level with facet capsules resected.  Dissection carried out laterally to the sacral ala bilaterally at the S1 level and to the transverse process of L5 over the lateral aspect of the facets at the L4-5 level and the transverse process of L4 over the lateral aspect of the L3-4 facet, preserving facet  capsule at this level.  Dissection was carried further superiorly, releasing the muscle attachments to allow for further exposure laterally to obtain convergence of the instrumentation with insertion screws and pedicle before and later in the procedure.  Bleeding controlled using bipolar and unipolar electrocautery and packing of the posterolateral aspects of the spine after exposure of the transverse processes at L4-L5 and sacral ala.  The spinous process of L5 and of L4 was then resected at the midline.  The posterior lamina was thinned using Leksell rongeur at the L5-L4 level.  The inferior one-half of the spinous process of L3 was resected.  All this bone graft was carefully morselized, debrided of soft tissue attachment, preserved for later use for bone grafting purposes.  Osteotomes were then used to carefully thin  the posterior aspect of lamina at L5 and L4.  Inferior articular process resection then carried out using osteotomes at the L5 level and at the L4 level. Residual remaining central lamina at the L5 level was then resected using 3- and 4-mm Kerrisons.  Osteotomes were then used to resect the medial aspect of the lamina in the area of the pars at the L5 level and this was continued superiorly to the L4-L5 level where lateral recess decompression was carried out.  The central portions of the lamina of L4 were also resected using first osteotomes to thin and then Kerrisons to resect central portions of the lamina to the L3-L4 level.  Central portions laminectomy at the L4-5 level were then further opened using osteotomes over the pars areas bilaterally and the superior portion of the lamina up to the L3-4 segment where very minimal amount of medial facet at the L4-5 level was resected using quarter-inch osteotomes. Ligamentum flavum was then debrided over the medial aspect of the facet at the L3-4 level, and the retained ligamentum flavum over the ventral surface of the  inferior aspect of the lamina of L3 was resected using 3- and 4-mm Kerrisons.  Loupe magnification headlamp was used during this portion of procedure and carefully then the lateral recess was decompressed at L3-L4 and the L4 nerve root identified as it exits out the neural foramen of L4 beneath the pars at the L4-5 level.  A hockey stick neural probe was able to be passed out the neural foramen over the nerve root and resection of medial aspect of the inferior articular process of L4 was carried out and then the lateral recess at L4-5 was well decompressed again using the osteotome.  Reflected portions of the ligamentum flavum were then resected additionally using 3-mm Kerrison. The entire inferior reticular process of L5 was resected on the right side as well as the left side after first releasing the facet capsule posteriorly using 3-mm Kerrison along the medial aspect of the facet and then removing the residual intraarticular process with curettes as well as osteotomes and Leksell rongeur.  This was performed similarly on the right side, performing a lateral recess decompression at the L3-L4 level, carefully evaluating the L4 nerve root following it as it exited out the neural foramen of L4.  Passing a hockey stick neural probe over the posterior aspect of the nerve root then resecting bone overlying this area until the right side neural foramen of L4 had been completely decompressed.  Residual pars area was resected in total to allow for exposure for the TLIF.  The medial portion of the superior articular process of L5 was resected to the medial border of the L5 pedicle at the L4-L5 level.  The superior articular process of L5 was completely resected and the L4 root was found to be well decompressed at this point.  Any residual ligamentum flavum had been resected and this was reflected beneath the medial aspect of the facet as well.  Similarly, this was carried out on the right side as  well as left side.  The area of pars defects at the L5 level showed quite a bit of hypertrophic scar tissue causing compression of the L5 nerve root as it exits out the neural foramen at the L5-S1 level so that again a hockey stick neural probe was able to be passed over the posterior and the superior aspect of the L5 nerve roots and resection of bone was carried out first using osteotomes, then  3-mm Kerrisons.  Decompressing the left and right L5 nerve roots, the superior articular process of S1 was carefully resected over its medial border to the level of medial aspect of the S1 pedicle. Foraminotomy performed bilaterally over the S1 nerve roots and residual ligamentum flavum centrally was resected.  There was some scar tissue associated with the ligamentum flavum present which required careful dissection with Penfield 4 and hockey stick neural probe and dissection using a bayonet with cottonoids.  Carefully freeing the fat and scar off the ventral surface of the ligamentum flavum prior to its resection off the superior lamina of S1.  At this point then a hockey stick neural probe could be passed out both the S1 neural foramen and the L5 neural foramen.  A large disk herniation was found be present within the axillary portion of the L5 nerve root on the right side superior to the L5-S1 disk space.  This disk herniation was carefully evaluated using the Penfield 4 and freed up and entered, and disk material immediately exuded.  The disk material had the appearance of granular-type material as opposed to fibrous disk, and a portion of it was sent for identification purposes.  The disk was then decompressed using micro pituitary rongeurs and upbiting pituitaries, carefully retracting the thecal sac and the L5 nerve root to protect while this was resected, decompressing the right side L5 nerve root.  Exposure obtained over the posterior aspect the L5-S1 disk and then this was incised using  15 blade scalpel, as was the disk at the right side of the L4-5 level.  Operating room C-arm fluoro was then used to carefully position the awl at the entry point for the L4 pedicle at the intersection of pedicle of transverse process on the left side.  This was correctly aligned and a handheld pedicle probe used to probe the pedicle to a depth of 40 mm on the left.  A 7 mm x 40 mm screw was chosen for use here.  Tapping was performed using a 6-mm tap, carefully checking with a ball-tipped probe the entry into the pedicle to ensure no broaching of the cortex here. Decortication of the transverse process of L4 on the left was carried out.  Bone graft was applied and then a 7.0 mm pedicle screw x 40 mm length was inserted on the left side at the L4 level.  At the L5 level on the left side similarly, an awl was placed. Correct degree of convergence aligned with the pedicle on lateral C-arm views.  A pedicle probe was then used to probe the pedicle at the correct degree of convergence.  This was measured to a depth of about 45 mm.  A 45 mm screw x 7 mm was chosen for the L5 level.  Tapping with 6 mm tap, then the low transverse process of L5 was carefully decorticated with high- speed bur.  Bone graft applied to the transverse process of L4 and L5 on the left side.  Ball-tipped probe was used to probe pedicle channel and to ensure no broaching of cortex.  None felt to be present, and a 45-mm x 7-mm screw was then placed without difficulty.  Each of these screw fasteners were then carefully loosened.  Final screw was placed at the S1 level at the area of depression, just lateral to the superior articular process of S1's inferior aspect and an awl was used to again determine the correct level for placement of the screws and a pedicle probe used  to probe the pedicle.  Ball-tipped probe used to ensure patency of the opening in the pedicle with no sign of broaching of the cortex.  Tapping with a  6.0 tap and a 40 mm x 7 mm screw was placed on the left side at the S1 level following decortication and bone grafting from the sacral ala to the L5 transverse process on this side using local bone graft.  The rod was then carefully contoured to fit the fasteners from L4 to S1 on left side; a 65 mm length rod was chosen. This fit nicely into the fasteners and initial caps were placed. Attention was then turned to the right side where similarly the screws were placed at L4, L5, and S1 levels using the aforementioned procedure. First, an awl used to make the entry point and then a pedicle probe used to probe pedicles at each level using C-arm for verification and tapping with a 6-mm tap at each level, verifying again with a ball-tipped probe and ensuring no broaching of cortex and inserting the screws in each segment individually.  Again, a 40 mm x 7.0 screw used to the L4  level, 45 mm x 7 mm used at the L5 level.  Then, at the S1 level a 40 mm x 7.0 screw used.  These all appeared to be in excellent position and alignment on lateral view with C-arm.  Bone grafting was carried out with decortication of transverse process of L4-L5 and sacral ala on the right side.  Attention then turned to performing TLIF, first at the L5- S1 level on the right side where the L5 nerve retracted superiorly.  The disk was then dilated using graduated dilators, 7, 8, 9, and then 10 mm which provided best fit.  C-arm fluoro demonstrated the dilators sitting nicely within the disk space, but taking up the entire disk space.  Disk was then debrided of degenerative disk material using curettage, first with a straight curette, then upbiting right, upbiting left curettes, ring curettes both straight and upbiting.  Carefully, the endplates were inspected using loupe magnification headlamp.  Appeared to show bleeding bone surfaces without any further degenerative cartilage end plates or disk material remaining.   Sounding of the disk space indicated that a 10- mm lordotic cage provided best fit.  Therefore, morselized bone graft was then placed within the intervertebral disk space, being impacted twice with a 7-mm sounder.  Finally, the implant, a 10-mm lordotic DePuy Concorde cage was carefully packed with local bone graft and then this was impacted into place into the L5-1 disk space.  Correct degree of convergence about 15 degrees.  Subset beneath the posterior aspect of the disk space by about 3 or 4 mm.  Any residual bone graft within the spinal canal was then carefully removed using bayonets.  Bleeders controlled using Gelfoam thrombin-soaked and cottonoids.  Attention turned to the L4-L5 level where similarly the disk had been excised.  It was then carefully dilated using 7, 8, 9, and 10 mm dilators. Pituitaries were then used to debride the disk space and curettage carried out of the disk space at the L4-5 level, resecting any degenerative disk material and degenerative cartilage endplates that remained.  This was removed down to bleeding bone surfaces involving the endplates of bone.  Next, sounding was carried out.  A 10-mm lordotic cage was chosen based on sounding and a positive region on C-arm that the 10-mm cage provided best fit and filling of the disk space.  The  intervertebral disk space was then carefully filled with local bone graft impacting twice with a 7-mm sounder.  Final cage, a 10-mm lordotic DePuy Concorde cage was then carefully packed with morselized local bone graft.  It was then inserted into the disk space and impacted into place.  Subset beneath the inferoposterior aspect of C4 in its spondylolisthetic position.  C-arm fluoro demonstrated the cages to be in good position and alignment, both at L4-L5 and L5-S1 levels.  A 65-mm rod for the right screws was carefully further contoured using a plate bender.  This was then placed within the caps and the fasteners of  L4-S1 on the right side and caps were then passed and tightened to 80 foot- pounds at the L4 level on both right and left side.  Compression then obtained between the L4 and L5 fastener and the fastener at L5 was tightened to 80 foot-pounds.  Compression then obtained between the L5 and S1 fasteners on the right side and S1 cap was then tightened to 80 foot-pounds.  Attention then turned to the left side, where similarly the rod was attached to the fastener at the L4 level and 80 foot-pounds and compression between the L4 and L5 fasteners and the fastener cap tightened to 80 foot-pounds at L5.  Then, compression between L5 and S1 fastener and fastener cap at the S1 level was then tightened to 80 foot- pounds, obtaining fixation of the spine.  Measuring between the fifth and the S1 fasteners then, a cross link was chosen; a 6 size was chosen. This was then carefully placed over the rods bilaterally at the clearing of the rod between the fasteners of L5, the pedicle screw, and S1 pedicle screw.  The cross-link rod was then carefully tightened to the rods bilaterally.  Care taken to use the anti-torque handle and the central portion of the transverse loading rod was then carefully tight. This completed the fixation posteriorly.  Permanent C-arm images were obtained in the AP and lateral planes documenting fixation.  Also in oblique planes to document the pedicle screws in good position and alignment.  Any residual bone graft remaining was then placed posterolaterally on the left side to augment the already posterolateral fusion mass.  Irrigation was carried out.  Careful inspection of the laminectomy sites using a hockey stick neural probe demonstrated the neural foramen to be well decompressed at L4, L5, and S1, both right and left sides.  Hemostasis obtained using thrombin-soaked Gelfoam.  All excessive Gelfoam was then removed.  There was no active bleeding present.  Then, a medium  Hemovac drain was placed in the depth of the incision exiting over the right lower lumbar spine.  The paraspinous muscles were carefully debrided of any devitalized tissue and the paralumbar muscles were debrided then of any devitalized tissue. Paralumbar muscles were then loosely approximated in the midline with interrupted #1 Vicryl sutures.  Lumbodorsal fascia reattached to the supraspinous ligament at the L2-L3 levels superiorly and the S1-S2 levels inferiorly.  Lumbodorsal fascia was then reapproximated to itself in the midline extending from L3-S1.  This was done using #1 Vicryl in a simple figure-of-eight fashion.  Deep subcu layers were then approximated with interrupted 0 and #1 Vicryl sutures, more superficial with interrupted 2-0 Vicryl suture and the skin closed with a running subcu stitch of 4-0 Vicryl.  Dermabond was then applied, Mepilex bandage.  The patient was then returned to a supine position, reactivated, extubated, and returned to the recovery room in satisfactory condition.  All instrument and sponge counts were correct. Of note, Cell Saver was used during this case.  Operating room microscope was not used.  PAS hose were used for the patient.  PHYSICIAN ASSISTANT'S RESPONSIBILITY:  Maud Deed performed duties of physician assistant and Designer, television/film set.  She was present from beginning of case until the end of the case, helped with the assisting and transfer of the patient from the stretcher to the OR table and positioning carefully.  She assisted in maintaining hemostasis, delicate retraction of delicate neural structures, as well as suctioning field to keep dry, preparation of local bone graft, as well as in the instrumentation phase with the insertion of the transverse loading rod and tensioning and torquing of the caps for the connection of rods to the pedicle screws.  She then assisted in the closure of the incision from the fascial layer to the skin.  She  assisted then with transfer of the patient from the OR table back to the bed and transferred then to the recovery room.     Kerrin Champagne, M.D.     JEN/MEDQ  D:  08/09/2010  T:  08/10/2010  Job:  [id number]  Electronically Signed by Vira Browns M.D. on 08/22/2010 04:56:25 PM

## 2010-08-25 ENCOUNTER — Emergency Department (HOSPITAL_COMMUNITY): Payer: Medicare Other

## 2010-08-25 ENCOUNTER — Inpatient Hospital Stay (HOSPITAL_COMMUNITY)
Admission: EM | Admit: 2010-08-25 | Discharge: 2010-09-01 | DRG: 419 | Disposition: A | Discharge status: Home Health | Payer: Medicare Other | Attending: Internal Medicine | Admitting: Internal Medicine

## 2010-08-25 DIAGNOSIS — E785 Hyperlipidemia, unspecified: Secondary | ICD-10-CM | POA: Diagnosis present

## 2010-08-25 DIAGNOSIS — D72829 Elevated white blood cell count, unspecified: Secondary | ICD-10-CM | POA: Diagnosis present

## 2010-08-25 DIAGNOSIS — Z5331 Laparoscopic surgical procedure converted to open procedure: Secondary | ICD-10-CM

## 2010-08-25 DIAGNOSIS — D473 Essential (hemorrhagic) thrombocythemia: Secondary | ICD-10-CM | POA: Diagnosis present

## 2010-08-25 DIAGNOSIS — D500 Iron deficiency anemia secondary to blood loss (chronic): Secondary | ICD-10-CM | POA: Diagnosis present

## 2010-08-25 DIAGNOSIS — K8050 Calculus of bile duct without cholangitis or cholecystitis without obstruction: Principal | ICD-10-CM | POA: Diagnosis present

## 2010-08-25 DIAGNOSIS — K5900 Constipation, unspecified: Secondary | ICD-10-CM | POA: Diagnosis present

## 2010-08-25 DIAGNOSIS — Z903 Acquired absence of stomach [part of]: Secondary | ICD-10-CM

## 2010-08-25 LAB — DIFFERENTIAL
Basophils Absolute: 0 10*3/uL (ref 0.0–0.1)
Basophils Relative: 0 % (ref 0–1)
Eosinophils Absolute: 0.1 10*3/uL (ref 0.0–0.7)
Eosinophils Relative: 2 % (ref 0–5)
Lymphocytes Relative: 16 % (ref 12–46)
Lymphs Abs: 1.2 10*3/uL (ref 0.7–4.0)
Monocytes Absolute: 0.8 10*3/uL (ref 0.1–1.0)
Monocytes Relative: 11 % (ref 3–12)
Neutro Abs: 5.5 10*3/uL (ref 1.7–7.7)
Neutrophils Relative %: 72 % (ref 43–77)

## 2010-08-25 LAB — COMPREHENSIVE METABOLIC PANEL
ALT: 71 U/L — ABNORMAL HIGH (ref 0–35)
AST: 102 U/L — ABNORMAL HIGH (ref 0–37)
Albumin: 2.8 g/dL — ABNORMAL LOW (ref 3.5–5.2)
Alkaline Phosphatase: 119 U/L — ABNORMAL HIGH (ref 39–117)
BUN: 6 mg/dL (ref 6–23)
CO2: 22 mEq/L (ref 19–32)
Calcium: 8.6 mg/dL (ref 8.4–10.5)
Chloride: 107 mEq/L (ref 96–112)
Creatinine, Ser: 0.65 mg/dL (ref 0.4–1.2)
GFR calc Af Amer: >60 mL/min (ref >60)
GFR calc non Af Amer: >60 mL/min (ref >60)
Glucose, Bld: 115 mg/dL — ABNORMAL HIGH (ref 70–99)
Potassium: 3.5 mEq/L (ref 3.5–5.1)
Sodium: 137 mEq/L (ref 135–145)
Total Bilirubin: 0.6 mg/dL (ref 0.3–1.2)
Total Protein: 6.5 g/dL (ref 6.0–8.3)

## 2010-08-25 LAB — CBC
HCT: 27.1 % — ABNORMAL LOW (ref 36.0–46.0)
Hemoglobin: 8.7 g/dL — ABNORMAL LOW (ref 12.0–15.0)
MCH: 30.6 pg (ref 26.0–34.0)
MCHC: 32.1 g/dL (ref 30.0–36.0)
MCV: 95.4 fL (ref 78.0–100.0)
Platelets: 219 10*3/uL (ref 150–400)
RBC: 2.84 MIL/uL — ABNORMAL LOW (ref 3.87–5.11)
RDW: 14.7 % (ref 11.5–15.5)
WBC: 7.6 10*3/uL (ref 4.0–10.5)

## 2010-08-25 LAB — LIPASE, BLOOD: Lipase: 31 U/L (ref 11–59)

## 2010-08-26 LAB — COMPREHENSIVE METABOLIC PANEL
ALT: 57 U/L — ABNORMAL HIGH (ref 0–35)
AST: 52 U/L — ABNORMAL HIGH (ref 0–37)
Albumin: 2.7 g/dL — ABNORMAL LOW (ref 3.5–5.2)
Alkaline Phosphatase: 120 U/L — ABNORMAL HIGH (ref 39–117)
BUN: 4 mg/dL — ABNORMAL LOW (ref 6–23)
CO2: 24 mEq/L (ref 19–32)
Calcium: 8.7 mg/dL (ref 8.4–10.5)
Chloride: 109 mEq/L (ref 96–112)
Creatinine, Ser: 0.65 mg/dL (ref 0.4–1.2)
GFR calc Af Amer: >60 mL/min (ref >60)
GFR calc non Af Amer: >60 mL/min (ref >60)
Glucose, Bld: 98 mg/dL (ref 70–99)
Potassium: 3.7 mEq/L (ref 3.5–5.1)
Sodium: 141 mEq/L (ref 135–145)
Total Bilirubin: 0.4 mg/dL (ref 0.3–1.2)
Total Protein: 6.1 g/dL (ref 6.0–8.3)

## 2010-08-26 LAB — CBC
HCT: 27.6 % — ABNORMAL LOW (ref 36.0–46.0)
Hemoglobin: 8.6 g/dL — ABNORMAL LOW (ref 12.0–15.0)
MCH: 30.1 pg (ref 26.0–34.0)
MCHC: 31.2 g/dL (ref 30.0–36.0)
MCV: 96.5 fL (ref 78.0–100.0)
Platelets: 459 10*3/uL — ABNORMAL HIGH (ref 150–400)
RBC: 2.86 MIL/uL — ABNORMAL LOW (ref 3.87–5.11)
RDW: 15 % (ref 11.5–15.5)
WBC: 6.2 10*3/uL (ref 4.0–10.5)

## 2010-08-26 LAB — PROTIME-INR
INR: 0.96 (ref 0.00–1.49)
Prothrombin Time: 13 seconds (ref 11.6–15.2)

## 2010-08-27 DIAGNOSIS — R740 Nonspecific elevation of levels of transaminase and lactic acid dehydrogenase [LDH]: Secondary | ICD-10-CM

## 2010-08-27 DIAGNOSIS — R7401 Elevation of levels of liver transaminase levels: Secondary | ICD-10-CM

## 2010-08-27 DIAGNOSIS — K8020 Calculus of gallbladder without cholecystitis without obstruction: Secondary | ICD-10-CM

## 2010-08-27 LAB — COMPREHENSIVE METABOLIC PANEL
ALT: 35 U/L (ref 0–35)
AST: 22 U/L (ref 0–37)
Albumin: 2.5 g/dL — ABNORMAL LOW (ref 3.5–5.2)
Alkaline Phosphatase: 99 U/L (ref 39–117)
BUN: 2 mg/dL — ABNORMAL LOW (ref 6–23)
CO2: 25 mEq/L (ref 19–32)
Calcium: 8.7 mg/dL (ref 8.4–10.5)
Chloride: 107 mEq/L (ref 96–112)
Creatinine, Ser: 0.68 mg/dL (ref 0.4–1.2)
GFR calc Af Amer: >60 mL/min (ref >60)
GFR calc non Af Amer: >60 mL/min (ref >60)
Glucose, Bld: 94 mg/dL (ref 70–99)
Potassium: 3.6 mEq/L (ref 3.5–5.1)
Sodium: 139 mEq/L (ref 135–145)
Total Bilirubin: 0.3 mg/dL (ref 0.3–1.2)
Total Protein: 5.5 g/dL — ABNORMAL LOW (ref 6.0–8.3)

## 2010-08-28 DIAGNOSIS — R740 Nonspecific elevation of levels of transaminase and lactic acid dehydrogenase [LDH]: Secondary | ICD-10-CM

## 2010-08-28 DIAGNOSIS — R7402 Elevation of levels of lactic acid dehydrogenase (LDH): Secondary | ICD-10-CM

## 2010-08-28 DIAGNOSIS — R7401 Elevation of levels of liver transaminase levels: Secondary | ICD-10-CM

## 2010-08-28 DIAGNOSIS — K8020 Calculus of gallbladder without cholecystitis without obstruction: Secondary | ICD-10-CM

## 2010-08-28 LAB — COMPREHENSIVE METABOLIC PANEL
ALT: 25 U/L (ref 0–35)
AST: 18 U/L (ref 0–37)
Albumin: 2.3 g/dL — ABNORMAL LOW (ref 3.5–5.2)
Alkaline Phosphatase: 96 U/L (ref 39–117)
BUN: 2 mg/dL — ABNORMAL LOW (ref 6–23)
CO2: 26 mEq/L (ref 19–32)
Calcium: 8.6 mg/dL (ref 8.4–10.5)
Chloride: 109 mEq/L (ref 96–112)
Creatinine, Ser: 0.62 mg/dL (ref 0.4–1.2)
GFR calc Af Amer: >60 mL/min (ref >60)
GFR calc non Af Amer: >60 mL/min (ref >60)
Glucose, Bld: 91 mg/dL (ref 70–99)
Potassium: 3.7 mEq/L (ref 3.5–5.1)
Sodium: 140 mEq/L (ref 135–145)
Total Bilirubin: 0.3 mg/dL (ref 0.3–1.2)
Total Protein: 5.1 g/dL — ABNORMAL LOW (ref 6.0–8.3)

## 2010-08-28 LAB — DIFFERENTIAL
Basophils Absolute: 0 10*3/uL (ref 0.0–0.1)
Basophils Relative: 0 % (ref 0–1)
Eosinophils Absolute: 0.3 10*3/uL (ref 0.0–0.7)
Eosinophils Relative: 5 % (ref 0–5)
Lymphocytes Relative: 31 % (ref 12–46)
Lymphs Abs: 1.6 10*3/uL (ref 0.7–4.0)
Monocytes Absolute: 0.9 10*3/uL (ref 0.1–1.0)
Monocytes Relative: 18 % — ABNORMAL HIGH (ref 3–12)
Neutro Abs: 2.3 10*3/uL (ref 1.7–7.7)
Neutrophils Relative %: 46 % (ref 43–77)

## 2010-08-28 LAB — CBC
HCT: 24.6 % — ABNORMAL LOW (ref 36.0–46.0)
Hemoglobin: 8 g/dL — ABNORMAL LOW (ref 12.0–15.0)
MCH: 30.9 pg (ref 26.0–34.0)
MCHC: 32.5 g/dL (ref 30.0–36.0)
MCV: 95 fL (ref 78.0–100.0)
Platelets: 372 10*3/uL (ref 150–400)
RBC: 2.59 MIL/uL — ABNORMAL LOW (ref 3.87–5.11)
RDW: 14.6 % (ref 11.5–15.5)
WBC: 5.1 10*3/uL (ref 4.0–10.5)

## 2010-08-29 ENCOUNTER — Other Ambulatory Visit: Payer: Self-pay | Admitting: Surgery

## 2010-08-29 ENCOUNTER — Inpatient Hospital Stay (HOSPITAL_COMMUNITY): Payer: Medicare Other

## 2010-08-29 LAB — TYPE AND SCREEN
ABO/RH(D): O POS
Antibody Screen: NEGATIVE

## 2010-08-30 LAB — COMPREHENSIVE METABOLIC PANEL
ALT: 41 U/L — ABNORMAL HIGH (ref 0–35)
AST: 41 U/L — ABNORMAL HIGH (ref 0–37)
Albumin: 2.5 g/dL — ABNORMAL LOW (ref 3.5–5.2)
Alkaline Phosphatase: 104 U/L (ref 39–117)
BUN: 4 mg/dL — ABNORMAL LOW (ref 6–23)
CO2: 27 mEq/L (ref 19–32)
Calcium: 8.5 mg/dL (ref 8.4–10.5)
Chloride: 105 mEq/L (ref 96–112)
Creatinine, Ser: 0.69 mg/dL (ref 0.4–1.2)
GFR calc Af Amer: >60 mL/min (ref >60)
GFR calc non Af Amer: >60 mL/min (ref >60)
Glucose, Bld: 116 mg/dL — ABNORMAL HIGH (ref 70–99)
Potassium: 3.7 mEq/L (ref 3.5–5.1)
Sodium: 137 mEq/L (ref 135–145)
Total Bilirubin: 0.5 mg/dL (ref 0.3–1.2)
Total Protein: 5.9 g/dL — ABNORMAL LOW (ref 6.0–8.3)

## 2010-08-30 LAB — CBC
HCT: 28.3 % — ABNORMAL LOW (ref 36.0–46.0)
Hemoglobin: 9.1 g/dL — ABNORMAL LOW (ref 12.0–15.0)
MCH: 30.5 pg (ref 26.0–34.0)
MCHC: 32.2 g/dL (ref 30.0–36.0)
MCV: 95 fL (ref 78.0–100.0)
Platelets: 419 10*3/uL — ABNORMAL HIGH (ref 150–400)
RBC: 2.98 MIL/uL — ABNORMAL LOW (ref 3.87–5.11)
RDW: 14.5 % (ref 11.5–15.5)
WBC: 14.8 10*3/uL — ABNORMAL HIGH (ref 4.0–10.5)

## 2010-08-31 LAB — DIFFERENTIAL
Basophils Absolute: 0 10*3/uL (ref 0.0–0.1)
Basophils Relative: 0 % (ref 0–1)
Eosinophils Absolute: 0.8 10*3/uL — ABNORMAL HIGH (ref 0.0–0.7)
Eosinophils Relative: 9 % — ABNORMAL HIGH (ref 0–5)
Lymphocytes Relative: 17 % (ref 12–46)
Lymphs Abs: 1.5 10*3/uL (ref 0.7–4.0)
Monocytes Absolute: 1.3 10*3/uL — ABNORMAL HIGH (ref 0.1–1.0)
Monocytes Relative: 14 % — ABNORMAL HIGH (ref 3–12)
Neutro Abs: 5.4 10*3/uL (ref 1.7–7.7)
Neutrophils Relative %: 60 % (ref 43–77)

## 2010-08-31 LAB — CBC
HCT: 23.6 % — ABNORMAL LOW (ref 36.0–46.0)
Hemoglobin: 7.6 g/dL — ABNORMAL LOW (ref 12.0–15.0)
MCH: 30.4 pg (ref 26.0–34.0)
MCHC: 32.2 g/dL (ref 30.0–36.0)
MCV: 94.4 fL (ref 78.0–100.0)
Platelets: 331 10*3/uL (ref 150–400)
RBC: 2.5 MIL/uL — ABNORMAL LOW (ref 3.87–5.11)
RDW: 14.6 % (ref 11.5–15.5)
WBC: 9 10*3/uL (ref 4.0–10.5)

## 2010-08-31 LAB — COMPREHENSIVE METABOLIC PANEL
ALT: 28 U/L (ref 0–35)
AST: 21 U/L (ref 0–37)
Albumin: 2.2 g/dL — ABNORMAL LOW (ref 3.5–5.2)
Alkaline Phosphatase: 80 U/L (ref 39–117)
BUN: 4 mg/dL — ABNORMAL LOW (ref 6–23)
CO2: 28 mEq/L (ref 19–32)
Calcium: 8.5 mg/dL (ref 8.4–10.5)
Chloride: 107 mEq/L (ref 96–112)
Creatinine, Ser: 0.74 mg/dL (ref 0.4–1.2)
GFR calc Af Amer: >60 mL/min (ref >60)
GFR calc non Af Amer: >60 mL/min (ref >60)
Glucose, Bld: 94 mg/dL (ref 70–99)
Potassium: 3.4 mEq/L — ABNORMAL LOW (ref 3.5–5.1)
Sodium: 141 mEq/L (ref 135–145)
Total Bilirubin: 0.5 mg/dL (ref 0.3–1.2)
Total Protein: 5.2 g/dL — ABNORMAL LOW (ref 6.0–8.3)

## 2010-08-31 LAB — IRON: Iron: <10 ug/dL — ABNORMAL LOW (ref 42–135)

## 2010-08-31 NOTE — Discharge Summary (Signed)
NAME:  Sara Fox, Sara Fox                ACCOUNT NO.:  [id number]  MEDICAL RECORD NO.:  [id number]           PATIENT TYPE:  I  LOCATION:  5504                         FACILITY:  [facility]  PHYSICIAN:  Marinda Elk, M.D.DATE OF BIRTH:  Dec 16, 1940  DATE OF ADMISSION:  08/25/2010 DATE OF DISCHARGE:                              DISCHARGE SUMMARY   PRIMARY CARE DOCTOR:  Dr. Gwenette Greet.  DISCHARGE DIAGNOSES: 1. Acute choledocholithiasis status post lap chole. 2. Recent neck surgery. 3. Anemia. 4. Hyperlipidemia. 5. Thrombocytosis. 6. Leukocytosis.  DISCHARGE MEDICATIONS: 1. Vitamin C 500 mg p.o. daily. 2. Calcium supplement 1 tablet daily. 3. Docusate 100 mg p.o. b.i.d. 4. Flaxseed/fish oil 1 cap daily. 5. Ferrous sulfate 325 mg daily. 6. Lovastatin 20 mg daily. 7. Multivitamin 1 tablet daily. 8. Percocet 1-2 tabs q.2 h. p.r.n. 9. Oxycodone 20 mg SR 1 tablet b.i.d. 10.Robaxin 1 tablet daily. 11.VESIcare 1 tablet daily. 12.Vitamin B 1000 units daily. 13.Vitamin E 1 caps daily. 14.Zyrtec 10 mg daily.  PROCEDURES PERFORMED:  Lap chole by Dr. Luretha Murphy.  An ERCP by Dr. Elnoria Howard. Could not pass ERCP scope through anastomosis.  BRIEF ADMITTING HISTORY AND PHYSICAL:  This is a 70 year old female with recent back surgery for spinal stenosis presented with chief complaint of abdominal pain, nausea, and vomiting.  The patient reported that the day prior to admission, she woke up and had her usual breakfast along with Percocet.  She stated that for dinner she and her husband had food, she went to bed hoping that the nausea would resolve and woke up with her stomach on fire.  Please refer to dictation from August 25, 2010 for further details.  BRIEF HOSPITAL COURSE: 1. Acute choledocholithiasis.  She was put n.p.o., was put on pain     medication, IV Zofran.  GI was consulted.  They tried to do an ERCP     with the results above and when they could not do, surgery was  consulted.  They performed lap chole.  By the next day, she was     tolerating her diet.  So she was discharged in stable condition and     with pain medication, she tolerated her diet well. 2. Recent back surgery, completely stable, no changes were made.     Continue PT. 3. Blood loss anemia.  This is probably secondary to back surgery.     She was given ferrous sulfate which she will continue.  Her     hemoglobin has remained stable. 4. Thrombocytosis is reactive secondary to acute choledocholithiasis     and surgery. 5. Leukocytosis.  This resolved.  This is probably secondary to acute     choledocholithiasis. 6. Disposition.  The patient will follow up with her PCP in 2-4 weeks.     Here we will see how her blood pressure is doing and we will check     a CBC and probably increase her ferrous sulfate and get a CBC to     follow up on her hemoglobin.  Vitals on day of discharge shows a temperature of 97, pulse 89, respirations 18, blood  pressure 139/67, she was satting 94% on 2 liters. Labs on day of discharge shows sodium 137, potassium 3.7, chloride 105, albumin 27, glucose 116, BUN of 4, creatinine 0.6.  LFTs within normal limits except for AST and ALT of 41.  Her white count is 14, hemoglobin 9.1, and platelet count of 490,000.     Marinda Elk, M.D.     AF/MEDQ  D:  08/30/2010  T:  08/30/2010  Job:  [id number]  cc:   Gwenette Greet  Electronically Signed by Lambert Keto M.D. on 08/31/2010 06:14:20 AM

## 2010-09-01 LAB — CBC
HCT: 25 % — ABNORMAL LOW (ref 36.0–46.0)
Hemoglobin: 7.9 g/dL — ABNORMAL LOW (ref 12.0–15.0)
MCH: 29.7 pg (ref 26.0–34.0)
MCHC: 31.6 g/dL (ref 30.0–36.0)
MCV: 94 fL (ref 78.0–100.0)
Platelets: 391 10*3/uL (ref 150–400)
RBC: 2.66 MIL/uL — ABNORMAL LOW (ref 3.87–5.11)
RDW: 14.5 % (ref 11.5–15.5)
WBC: 6.8 10*3/uL (ref 4.0–10.5)

## 2010-09-01 LAB — DIFFERENTIAL
Basophils Absolute: 0 10*3/uL (ref 0.0–0.1)
Basophils Relative: 0 % (ref 0–1)
Eosinophils Absolute: 0.8 10*3/uL — ABNORMAL HIGH (ref 0.0–0.7)
Eosinophils Relative: 12 % — ABNORMAL HIGH (ref 0–5)
Lymphocytes Relative: 18 % (ref 12–46)
Lymphs Abs: 1.3 10*3/uL (ref 0.7–4.0)
Monocytes Absolute: 0.7 10*3/uL (ref 0.1–1.0)
Monocytes Relative: 11 % (ref 3–12)
Neutro Abs: 4 10*3/uL (ref 1.7–7.7)
Neutrophils Relative %: 59 % (ref 43–77)

## 2010-09-01 NOTE — Op Note (Signed)
NAME:  Sara Fox, Sara Fox                ACCOUNT NO.:  [id number]  MEDICAL RECORD NO.:  [id number]           PATIENT TYPE:  I  LOCATION:  5504                         FACILITY:  [facility]  PHYSICIAN:  Thornton Park. Daphine Deutscher, MD  DATE OF BIRTH:  February 21, 1941  DATE OF PROCEDURE:  08/29/2010 DATE OF DISCHARGE:                              OPERATIVE REPORT   PREOPERATIVE DIAGNOSIS:  This is a 70 year old African American lady with a previous gastrectomy B2 with a dilated common bile duct and bumped up LFTs and a history of gallbladder sludge.  PROCEDURE:  Laparoscopic transcystic cholangiogram, laparoscopic transcystic common duct exploration with the laparoscopic endoscope, laparoscopic cholecystectomy.  SURGEON:  Thornton Park. Daphine Deutscher, MD  ASSISTANT:  Eber Hong, PA  ANESTHESIA:  General endotracheal.  DESCRIPTION OF PROCEDURE:  The patient was taken to room 17 on Monday. Her abdomen was prepped with PCMX, draped sterilely.  I entered the abdomen through the right upper quadrant using a 0-degree 5-mm OptiVu. I entered and visualized the liver and the limb was able to insufflate. There was a little bleed bleeding above the liver, but that stopped spontaneously.  Gallbladder was grasped, elevated.  Three other trocars were placed including 11, 12 in the upper midline.  The 5-mm scope was placed around the belly button.  The gallbladder was markedly dilated as was markedly the dilated common bile duct.  I held out and got a critical view and dissected out the cystic duct from the common duct and found a lot of inflammatory adhesions.  These were teased away.  I put a clip upon the gallbladder and incised the cystic duct and after delaying 15 minutes because of some issues with the paging system in the radiology techs that came and we did a dynamic cholangiogram.  This showed the cystic duct was markedly dilated and a markedly dilated common bile duct, but with free flow into the duodenum.  I  did not see any obvious filling defects.  On milking this fluid back, we got several flecks of stony material and for that reason, I elected do a transcystic exploration.  We put the slimy wire in and attempted to circumnavigate the multiple valves.  I went ahead and passed the scope on down and got down where it appeared to be peering into the common duct.  The most import finding was that with the irrigant going full blast, there was none coming back out the cystic duct, so there was likely filling and passing in the common bile duct.  When this was completed, we went ahead and withdrew the scope and I then double clipped the cystic duct and put an Endoloop on the cystic duct with PDS.  Gallbladder was then removed from the gallbladder bed with the hook electrocautery and placed in a bag and brought out separately.  We reinspected the gallbladder bed, liver, everything looked to be in order.  Port sites were injected with Marcaine and closed with 4-0 Vicryl, Benzoin, and Steri-Strips.  The patient tolerated this procedure well and was taken to recovery room in satisfactory condition.     Thornton Park Daphine Deutscher,  MD     MBM/MEDQ  D:  08/29/2010  T:  08/30/2010  Job:  [id number]  cc:   Jordan Hawks. Elnoria Howard, MD  Electronically Signed by Luretha Murphy MD on 09/01/2010 08:18:38 AM

## 2010-09-03 NOTE — Discharge Summary (Signed)
NAMECHEY, RACHELS                ACCOUNT NO.:  [id number]  MEDICAL RECORD NO.:  [id number]           PATIENT TYPE:  I  LOCATION:  5016                         FACILITY:  [facility]  PHYSICIAN:  Kerrin Champagne, M.D.   DATE OF BIRTH:  10/18/1940  DATE OF ADMISSION:  08/01/2010 DATE OF DISCHARGE:  08/06/2010                              DISCHARGE SUMMARY   ADMISSION DIAGNOSES: 1. 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 2. Right-sided herniated nucleus pulposus L5-S1 with bilateral     spondylosis L5, degenerative disk disease of L4-5 and L5-S1, right-     sided S1 radiculopathy.  DISCHARGE DIAGNOSES: 1. 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 2. Right-sided herniated nucleus pulposus L5-S1 with bilateral     spondylosis L5, degenerative disk disease of L4-5 and L5-S1, right-     sided S1 radiculopathy. 3. Acute blood loss anemia. 4. Hypertension. 5. Elevated lipids. 6. Status post recent cervical fusion C3-C5 in March 2012.  PROCEDURE:  On August 01, 2010, the patient underwent: 1. Gill procedure L5 with resection of the spinous process of the     lamina and bilateral inferior articular process of L5 for     decompression purposes and a loose neural arch at L5 due to     spondylosis. 2. Excision of herniated nucleus pulposus right L5-S1. 3. Central decompressive laminectomy L4-5 for severe lumbar spinal     stenosis. 4. Transforaminal lumbar interbody fusion right L5-S1 and right L4-5. 5. Posterolateral fusion at L4-5 utilizing local bone graft. 6. Posterior segmental instrumentation from L4 to sacrum with pedicle     screws and rods.  This was performed by Dr. Otelia Sergeant, assisted by     Maud Deed PA-C under general anesthesia.  BRIEF HISTORY:  The patient is a 70 year old female who recently underwent cervical fusion C3-C5 for severe cervical stenosis.  She has history of at  least a year of low back pain as well.  Preoperative studies demonstrate persistent herniated nucleus pulposus along the right side of L5-S1, affecting both the L5 and S1 nerve roots and large disk fragment over the posterior aspect of the L5 vertebral body. Spondylosis was noted to the L5 level as well.  This was felt to be new from the MRI scan in 2012.  The patient has failed conservative treatment including epidural steroid injections.  It was felt she would benefit from surgical intervention and was admitted for the procedure as stated above.  BRIEF HOSPITAL COURSE:  The patient tolerated the procedure under general anesthesia without complications.  Postoperatively, neurovascular motor function of the lower extremities was noted to be intact.  The patient was fitted with an Aspen LSO for her lumbar spine. She also had an Aspen cervical collar secondary to her previous fusion which she wore full time.  For physical therapy purposes, she was taught to don and doff the lumbar Aspen brace and use it full-time except for when sleeping.  She was instructed on ambulation and gait training utilizing a walker.  Hemovac drain was discontinued on the first postoperative day and wound was healing well throughout the remainder of the hospital stay without signs of infection.  Diet was held until the patient's bowel functions returned.  After this happened then she was able to proceed to a regular diet.  Stool softeners were used on a daily basis.  The patient was able to void once the Foley catheter was discontinued.  Her urinalysis did show some abnormal findings and a urine culture showed 20,000 colonies of Proteus on final review.  The patient received the usual perioperative antibiotics of Ancef.  The patient has difficulty with pain control.  Initially, IV analgesics were used.  She was weaned to p.o. Percocet but this was not adequate for her pain and OxyContin 20 mg b.i.d. was started.   Percocet was utilized for breakthrough pain.  The patient was started on a steroid Dosepak as well.  She developed posthemorrhagic anemia.  The patient was started on iron.  She was asymptomatic from a standpoint of her anemia and therefore transfusion was not provided.  Hemoglobin at discharge was noted to be 7.5 with hematocrit 23.1.  On admission, hemoglobin was 9.0 with hematocrit 27.6.  Urinalysis as stated above.  PLAN:  The patient was discharged to home.  She will change her dressing daily or as needed.  She will wear her brace full-time for ambulation purposes.  She will continue on a regular diet.  She will be allowed to shower 5 days from surgery if there is no wound drainage.  MEDICATIONS AT DISCHARGE:  The patient was given prescriptions for: 1. Colace one p.o. b.i.d. 2. Ferrous sulfate 325 mg twice daily with meals. 3. Percocet 5/325 one to two every 4-6 hours as needed for     breakthrough pain. 4. OxyContin 20 mg b.i.d. 5. Robaxin 500 mg one every 8-12 hours as needed for spasm.  She will resume her home medications as taken prior to admission. Medication reconciliation form was provided.  The patient will follow up with Dr. Otelia Sergeant in 2 weeks from the date of her surgery.  All questions encouraged and answered.  CONDITION ON DISCHARGE:  Stable.     [name], P.A.   ______________________________ Kerrin Champagne, M.D.    SMV/MEDQ  D:  09/01/2010  T:  09/02/2010  Job:  [id number]  Electronically Signed by Maud Deed P.A. on 09/02/2010 12:46:54 PM Electronically Signed by Vira Browns M.D. on 09/03/2010 07:34:26 AM

## 2010-09-04 NOTE — Consult Note (Signed)
  NAME:  Sara Fox, Sara Fox                ACCOUNT NO.:  [id number]  MEDICAL RECORD NO.:  [id number]           PATIENT TYPE:  I  LOCATION:  5504                         FACILITY:  [facility]  PHYSICIAN:  Jordan Hawks. Elnoria Howard, MD    DATE OF BIRTH:  1940/05/26  DATE OF CONSULTATION:  08/26/2010 DATE OF DISCHARGE:                                CONSULTATION   This is an unassigned hospitalist patient.  HISTORY OF PRESENT ILLNESS:  This is a 70 year old female with a past medical history of neck surgery and back surgery, was admitted to the hospital with complaints of abdominal pain.  The patient states that her symptoms started acutely this past Wednesday and continued to persist. She had severe epigastric pain and was associated with nausea and vomiting.  She was not able to tolerate any p.o. and subsequently this led her to present to the emergency room for further evaluation and treatment.  While in the emergency room, the patient was noted to have mild elevation in her transaminases and subsequently she underwent an ultrasound, which revealed gallstones and sludge as well as a mildly dilated CBD.  The patient denies having any prior history of biliary issues.  As a result of the current symptoms, GI consultation was requested for further evaluation and treatment.  PAST MEDICAL AND SURGICAL HISTORY:  As stated above.  FAMILY HISTORY:  Noncontributory.  SOCIAL HISTORY:  Negative for alcohol, tobacco, illicit drug use.  MEDICATIONS: 1. Dilaudid 1 mg IV q.4 h. p.r.n. 2. Phenergan 12.5 mg q.6 h. P.r.n. 3. Tylenol 325 mg p.o. q.4 h. p.r.n. 4. Zofran 4 mg IV q.6 h. P.r.n.  ALLERGIES:  MORPHINE and  CELEBREX.  PHYSICAL EXAMINATION:  VITAL SIGNS:  Blood pressure is 136/73, heart rate is 83, respirations 19, temperature is 98.8. GENERAL:  The patient is in no acute distress, alert and oriented. HEENT:  Normocephalic, atraumatic.  Extraocular muscles intact. NECK:  Supple.  No  lymphadenopathy. LUNGS:  Clear to auscultation bilaterally. CARDIOVASCULAR:  Regular rate and rhythm. ABDOMEN:  Flat, soft, tender in the epigastrium.  No rebound or rigidity.  Positive bowel sounds. EXTREMITIES:  No clubbing, cyanosis, or edema.  LABORATORY VALUES:  White blood cell count is 6.2, hemoglobin is 8.6, platelets at 459, MCV is 96.5.  Sodium 141, potassium 3.7, chloride 109, CO2 of 24, BUN 4, creatinine 0.6, glucose is 98.  AST is 52, ALT 57, alk phos 120, total bilirubin 0.4, albumin 2.7.  IMPRESSION: 1. Gallbladder sludge. 2. Abnormal liver enzymes. 3. Epigastric pain.  Given the patient's current symptomatology, I will pursue an ERCP as maybe that she passed the stone with a decrease in her transaminases. However, she still has some abdominal pain and tends to worsen with p.o. intake when she was at home.  An ERCP will be performed at this time and then further recommendations will be made pending the findings.     Jordan Hawks Elnoria Howard, MD     PDH/MEDQ  D:  08/26/2010  T:  08/27/2010  Job:  [id number]  Electronically Signed by Jeani Hawking MD on 09/04/2010 08:54:22 PM

## 2010-09-05 NOTE — Consult Note (Signed)
NAME:  Sara Fox, Sara Fox                ACCOUNT NO.:  [id number]  MEDICAL RECORD NO.:  [id number]           PATIENT TYPE:  I  LOCATION:  5504                         FACILITY:  [facility]  PHYSICIAN:  Mary Sella. Andrey Campanile, MD     DATE OF BIRTH:  1940-10-04  DATE OF CONSULTATION:  08/27/2010 DATE OF DISCHARGE:                                CONSULTATION   PHYSICIAN REQUESTING CONSULTATION:  [facility].  PHYSICIAN PERFORMING CONSULTATION:  Mary Sella. Andrey Campanile, MD with [facility].  PRIMARY CARE PHYSICIAN:  Robyn N. Allyne Gee, M.D.  GASTROENTEROLOGIST:  Jordan Hawks. Elnoria Howard, MD  REASON FOR CONSULTATION:  Cholelithiasis, possible choledocholithiasis.  CHIEF COMPLAINT:  Abdominal pain.  HISTORY OF PRESENT ILLNESS:  The patient is a 70 year old African American female who was admitted on the 12th for epigastric and right upper quadrant pain associated with nausea and vomiting.  He was found to have elevated LFTs, gallstone, and probable choledocholithiasis on admission.  On April 11, she ate lunch consisting of some boiled legs and subsequently felt nauseous and then developed epigastric sharp pain that radiated to her right upper quadrant.  She developed emesis shortly thereafter.  She proceeded to have ongoing bouts of emesis throughout the night as well as persistent epigastric and right upper quadrant pain.  Following morning, she called her primary care physician who advised her to come to the emergency room where she was evaluated and subsequently admitted.  She denies any previous symptoms.  She denies any weight loss or jaundice.  She denies any clay-colored stools.  She says her pain has resolved and she just is a little bit "sore."  There was an attempt at an ERCP on April 13; however, they were unable to reach her ampulla due to her altered anatomy.  She denies any dysphasia. She denies any melena or hematochezia.  Her last bowel movement was today.  PAST MEDICAL  HISTORY: 1. Hypertension. 2. Dyslipidemia. 3. Spinal stenosis. 4. Gastroesophageal reflux disease. 5. COPD. 6. Anemia. 7. Remote history of cervical cancer.  PAST SURGICAL HISTORY: 1. L4-S1 back surgery in March 2012. 2. C3-C5 neck surgery in February 2012. 3. Right total knee arthroplasty. 4. Right shoulder surgery. 5. Left total knee replacement. 6. 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 7. Total abdominal hysterectomy and BSO.  ALLERGIES:  MORPHINE, CODEINE, VIOXX, and CELEBREX.  MEDICATIONS:  Percocet, lovastatin, VESIcare, Robaxin, iron sulfate, vitamin D3, multivitamin, vitamin C.  HOSPITAL MEDICATIONS:  Zofran, Phenergan, Tylenol, and Dilaudid.  FAMILY HISTORY:  Remarkable for cervical cancer.  Mom has also a colon cancer.  SOCIAL HISTORY:  She is a one-pack-a-day smoker for the past 25 years. She denies any drugs or alcohol.  She is married and lives with her husband and son.  REVIEW OF SYSTEMS:  She uses a walker.  She has reading glasses.  She wears a TLSO brace while ambulating and out of bed.  She denies any chest pain, shortness of breath, orthopnea, jaundice, or weight change. Otherwise, comprehensive 12-point review of systems  is negative except as mentioned in the HPI.  PHYSICAL EXAMINATION:  GENERAL:  She is an elderly-appearing African American female, no apparent distress. VITAL SIGNS:  Temperature 98.4, pulse 76, blood pressure 146/64, respirations 18, 97% on room air. HEENT:  Atraumatic, normocephalic.  Pupils are equal.  No scleral icterus.  No external ear lesions.  Hearing grossly normal.  She is missing some right lower quadrant teeth. NECK:  Supple.  No lymphadenopathy.  Trachea is midline. PULMONARY:  Lungs are clear.  Symmetric chest rise.  No accessory use of muscles. CARDIOVASCULAR:  Regular rhythm.  No murmurs, 2+ radial  pulse. ABDOMEN:  Soft, nontender, nondistended.  Well-healed midline incision. No evidence of incisional hernia. MUSCULOSKELETAL:  She moves all extremities.  There is no obvious acute joint deformity.  She has enlarged knees bilaterally.  She has well- healed incisions over both knees. SKIN:  No edema.  No jaundice. NEURO:  Nonfocal.  Sensation grossly intact. PSYCHIATRIC:  Alert and oriented x3.  LABS:  From today showed sodium 139, potassium 3.6, chloride 107, bicarb 25, BUN 2, creatinine 0.68, blood sugar 94, calcium 8.7, total bilirubin 0.3, AST is now at 22 with a high of 52, ALT 35 with a high of 57, alkaline phosphatase normal at 99 with a peak of 120 on admission.  CBC from the 13th showed a white blood cell count of 6.2, hemoglobin 8.6, hematocrit 27.6, platelet count 459.  Urinalysis negative.  Lipase from the 12th was 31.  Diagnostic ultrasound from admission showed small stones and sludge in the gallbladder.  Common bile duct was prominent at 8.6 mm.  The liver within normal limits.  PROCEDURE:  ERCP from the 13th was attempted, but was unable to reach the ampulla.  There was a small AVM in the efferent limb.  IMPRESSION:  A 70 year old [place] American female with; 1. Symptomatic cholelithiasis. 2. Possible choledocholithiasis. 3. Anemia. 4. Dyslipidemia. 5. Hypertension. 6. Gastroesophageal reflux disease. 7. Small bowel arteriovenous malformation.  PLAN:  Her LFTs are normalized.  More than likely she has passed common bile duct stone.  However, there is still a remote possibility that she could have a stone in her common bile duct.  I do think the patient needs a cholecystectomy with cholangiogram.  We will attempt to do it laparoscopically.  But given her previous extensive abdominal surgery, there is a possibility that she may have a fair amount of intra- abdominal adhesions requiring a right subcostal incision.  If her intraoperative cholangiogram  demonstrates a filling defect, she will need a common bile duct exploration since she is not a candidate for an ERCP.  Given the complexity of her case, it is possible we may defer it to early next week.  In the interim, keep her n.p.o. after midnight tonight.  I will discuss her case with Dr. Janee Morn who is our surgeon on-call tomorrow.  It is possible he may want to proceed, however, it is also possible given the complexity we may want to wait until the first of the week.  As she is currently afebrile with vital signs stable and normal LFTs, this is not an urgent or emergent procedure.     Mary Sella. Andrey Campanile, MD     EMW/MEDQ  D:  08/27/2010  T:  08/28/2010  Job:  [id number]  cc:   Jordan Hawks. Elnoria Howard, MD Candyce Churn. Allyne Gee, M.D.  Electronically Signed by Gaynelle Adu M.D. on 09/05/2010 02:59:47 PM

## 2010-09-05 NOTE — H&P (Signed)
NAME:  Sara Fox, Sara Fox NO.:  [id number]  MEDICAL RECORD NO.:  [id number]           PATIENT TYPE:  E  LOCATION:  [facility]                         FACILITY:  [facility]  PHYSICIAN:  Mariea Stable, MD   DATE OF BIRTH:  02-May-1941  DATE OF ADMISSION:  08/25/2010 DATE OF DISCHARGE:                             HISTORY & PHYSICAL   PRIMARY CARE PHYSICIAN:  Dr. Gwenette Greet.  CHIEF COMPLAINT:  Abdominal burning, nausea, vomiting.  HISTORY OF PRESENT ILLNESS:  Ms. Ressel is a 70 year old woman with past medical history significant for recent back surgery for spinal stenosis with herniated disk who presents with the chief complaint of abdominal burning, nausea, and vomiting.  The patient reports that the day prior to admission, she woke up and had her usual breakfast along with some Percocet for her pain.  Subsequently she had lunch in the afternoon which consisted of 3 boiled eggs.  She states that for dinner her husband prepared her a Sloppy [employer] which she ate but subsequently felt queasy.  She went to bed hoping that the nausea would resolve and woke up with her stomach "on fire."  She asked her husband for some water with ice and after drinking this, she got more nauseous and ended up throwing up.  She says that she had multiple bouts of emesis throughout the night and continued to feel nauseous.  In the morning she attempted breakfast and was only able to tolerate some crackers with persistent nausea but no vomiting.  After that she apparently had some tomato soup which again led to multiple episodes of emesis at that time.  Once this happened this morning, she called her primary care physician who instructed her to come to the emergency department for further evaluation.  Upon initial evaluation in the emergency department, the patient was noted to have abnormal LFTs with an alkaline phosphatase slightly elevated at 119, AST of 102, ALT of 71.  This prompted an  ultrasound of the abdomen which demonstrated a prominent common bile duct with consideration of his distal common bile duct calculus, stricture, or mass.  At that time, [facility] were asked to admit for further evaluation and manage symptom management.  PAST MEDICAL HISTORY: 1. Multiple back surgeries, most recent in March 2012, status post     bilateral total knee replacements 2. Hypertension. 3. Shoulder hemiarthroplasty. 4. Dyslipidemia. 5. Spinal stenosis. 6. GERD. 7. COPD. 8. Anemia.  MEDICATIONS: 1. Percocet p.r.n. pain. 2. Lovastatin. 3. VESicare. 4. Ferrous sulfate 5. Vitamin D3 6. Women's essential vitamin. 7. Vitamin C.  ALLERGIES:  MORPHINE, CODEINE, VIOXX, CELEBREX.  SOCIAL HISTORY:  The patient is married.  Her husband's name is Gardiner Barefoot.  Phone number is [phone].  The patient continues to smoke approximately a pack per day.  She denies any alcohol or drug use.  FAMILY HISTORY:  Noncontributory.  REVIEW OF SYSTEMS:  As per HPI.  All others reviewed and negative.  PHYSICAL EXAMINATION:  VITAL SIGNS:  Temperature 98.6, blood pressure 147/68, heart rate 92, respirations 20, oxygen saturation 98% on room air. GENERAL:  This is an elderly woman  lying in bed in no acute distress. HEENT:  Head is normocephalic, atraumatic.  The patient has a neck collar on.  Pupils are equally round and reactive to light.  Extraocular movements are intact.  Sclerae anicteric.  There is arcus senilis. Mucous membranes are dry.  There is poor dentition but no oral pharyngeal lesions. NECK:  Again with neck collar in place. LUNGS:  There is a fair air movement bilaterally that is clear to auscultation. HEART:  S1, S2 with a regular rate and rhythm.  There is a grade 1/6 systolic murmur.  There are no gallops or rubs. ABDOMEN:  Hypoactive bowel sounds, soft, nondistended.  There is no tenderness except for the right upper quadrant which is tender to  mild palpation along with a positive Murphy sign. EXTREMITIES:  There is no cyanosis, clubbing, or edema. NEUROLOGIC:  The patient is awake, alert and oriented x3.  She is moving all extremities.  Sensation is grossly intact.  LABORATORY DATA:  WBC 7.6, hemoglobin 8.7, platelets 219,000.  Sodium 137, potassium 3.5, chloride 107, bicarb 22, glucose 115, BUN 6, creatinine 0.65, total bilirubin 0.6, alkaline phosphatase 119, AST 102, ALT 71, protein 6.5, albumin 2.8, calcium 8.6, lipase 31.  IMAGING:  Abdominal ultrasound. Impression: 1. Prominent common bile duct.  Consider distal common bile duct     calculus, stricture, or mass.  Correlate with liver function test. 2. Small echodensities in the gallbladder without shadowing.  Possible     gallbladder sludge for noncalcified gallstones.  No pain over the     gallbladder.  ASSESSMENT/PLAN: 1. Abdominal pain.  Given physical exam and current     laboratory/radiologic studies, this appears to be consistent with     possible choledocholithiasis.  The patient has what appears to be     sludge for noncalcified gallstones in the gallbladder with a     prominent common bile duct.  Furthermore, she has had a minimally     elevated alkaline phosphatase with elevated AST and ALT, although     bilirubin is normal.  There is also the possibility of this being a     stricture or mass as opposed to a calculus.  At this point, we will     go ahead and admit for symptom management both the pain and nausea.     We will repeat chemistries and liver function tests in the morning.     We will consider gastroenterology consult in the morning to assist     with further evaluation, specifically MRCP versus ERCP. 2. Status post recent back surgery.  I will continue with p.r.n.     Dilaudid at a low-dose.  The patient is nauseous and had difficulty     taking p.o. intake and has an allergy listed to morphine. 3. Abnormal LFTs per #1. 4. Anemia likely  secondary to recent surgical procedure.  We will     continue with ferrous sulfate and monitor. 5. Hyperlipidemia.  We will hold off on the patient's statin at this     point given abdominal pain.  We will consider restarting upon     discharge.  CODE STATUS:  The patient is full code.  Her husband Enrique Weiss is the one to be contacted in case of need for medical decision making, phone number is [phone].     Mariea Stable, MD     MA/MEDQ  D:  08/25/2010  T:  08/25/2010  Job:  [id number]  cc:   Melina Schools  Lelon Perla  Electronically Signed by Mariea Stable MD on 09/05/2010 10:33:42 AM

## 2010-09-21 NOTE — Discharge Summary (Signed)
NAME:  Sara Fox, Sara Fox                ACCOUNT NO.:  [id number]  MEDICAL RECORD NO.:  [id number]           PATIENT TYPE:  I  LOCATION:  5504                         FACILITY:  [facility]  PHYSICIAN:  Altha Harm, MDDATE OF BIRTH:  01/22/1941  DATE OF ADMISSION:  08/25/2010 DATE OF DISCHARGE:  09/01/2010                              DISCHARGE SUMMARY   DISCHARGE DISPOSITION:  Home.  FINAL DISCHARGE DIAGNOSES: 1. Constipation, resolved. 2. Acute choledocholithiasis status post laparoscopic cholecystectomy. 3. Abdominal pain, resolved. 4. Nausea and vomiting, resolved.  Secondary conditions present on admission include the following: 1. Recent decompressive laminectomy and fusion of the lumbar spine. 2. Recent cervical spinal corpectomy at C4 level and decompression of     the cervical cord. 3. Chronic anemia. 4. Hyperlipidemia. 5. Thrombocytosis. 6. Leukocytosis, resolved.  FINAL DISCHARGE MEDICATIONS:  Include the following: 1. MiraLax 17 g in 8 ounces of fluid daily as needed for constipation. 2. Advanced C 500 mg p.o. daily. 3. Coral calcium supplement over the counter 1 tab p.o. daily. 4. Flaxseed/fish oil  supplement over the counter 1 tab p.o. daily. 5. Iron sulfate 325 mg p.o. b.i.d. with meals. 6. Lovastatin 20 mg p.o. daily. 7. Multivitamin with iron 1 tab p.o. daily. 8. Oxycodone/APAP 5/325 mg 1-2 tabs p.o. q.4-6 hours p.r.n. pain. 9. Oxycodone 20 mg sustained release 1 tab p.o. b.i.d. 10.Robaxin 500 mg p.o. b.i.d. 11.VESIcare 5 mg p.o. daily. 12.Vitamin D 1000 units p.o. daily. 13.Vitamin E 100 units p.o. daily. 14.Zyrtec 10 mg p.o. daily.  CONSULTANTS: 1. Jordan Hawks Elnoria Howard, MD, Gastroenterology. 2. Mary Sella. Andrey Campanile, MD, Surgery.  PROCEDURES:  Status post laparoscopic cholecystectomy, laparoscopic transcystic cholangiogram, with common bile duct exploration.  DIAGNOSTIC STUDIES: 1. Ultrasound of the abdomen done on admission which shows prominent     common  bile duct.  Consider distal common bile duct calculus,     stricture, or mass.  Correlate with liver function tests.     Impression, small active densities in the gallbladder without     shadowing.  Possible gallbladder sludge and noncalcified     gallstones.  No pain of the gallbladder. 2. Endoscopic cholangiogram which showed dilated bile ducts.  No     definite common duct stones identified.  Common bile duct appears     to be patent with contrast and duodenum noted.  PRIMARY CARE PHYSICIAN:  Robyn N. Allyne Gee, MD  CODE STATUS:  Full code.  ALLERGIES: 1. MORPHINE. 2. CODEINE. 3. CELEBREX. 4. VIOXX.  CHIEF COMPLAINT:  Abdominal burning, nausea, and vomiting.  HISTORY OF PRESENT ILLNESS:  Please refer to the H and P by Dr. Onalee Hua for details of the HPI.  However, in short, this is a 70 year old female with recent orthopedic surgery who presented to the emergency room with complaint of abdominal pain, burning, nausea, and vomiting.  The patient was evaluated in the emergency room and was found to have abnormal LFTs with an elevated alkaline phosphatase.  An ultrasound demonstrated a prominent common bile duct with consideration of distal bile duct calculus and at that time, the [facility] were asked to see the patient  for further evaluation and management.  HOSPITAL COURSE:  The patient was admitted and made n.p.o.  She was treated with antiemetics and supportive care.  Gastroenterology was noted and they attempted an ERCP, but was unsuccessful.  They consulted Surgery.  At that time, a laparoscopic cholecystectomy was performed. Once the patient was status post her laparoscopic cholecystectomy, she was started on diet which she tolerated well and has tolerated well up until this point.  The patient had constipation which further prolonged her length of stay and complicated her hospital course.  She was treated initially with MiraLax with poor results and then with  Dulcolax suppository, resulting in a large normally formed stools today.  Thus, the patient is now ready for discharge home on a soft diet. 1. Chronic anemia.  The patient was found to have anemia with a     hemoglobin of 7.9-8.  I spoke with the patient about colonoscopy     and she states that her last colonoscopy was approximately 10 years     ago.  I spoke with the patient's nurse practitioner who provides     her primary care at [facility], [place], and she will     schedule the patient to be seen by Dr. Elnoria Howard for a followup     colonoscopy both for diagnosis and screening.  As part of the     workup for her anemia, an iron level was done which was found to be     less than 10.  The patient is on oral iron but I question the     patient's absorption of the iron.  I offered the patient's IV iron     here and she declined, stating that she felt well and she would     like to put off getting any IV iron until she sees her primary care     physician and referred to Dr. Elnoria Howard.  This patient may benefit from     intermittent IV iron if indeed her absorption of oral iron is     diminished. 2. Recent cervical and lumbar laminectomy.  The patient was stable     during her hospitalization and is to follow up with her therapy and     with her orthopedic surgeon as previously instructed. 3. Thrombocytosis, felt to be reactive to her procedure which is now     resolved. 4. Leukocytosis, felt to be also possibly a leukemoid reaction related     to her choledocholithiasis, which is now resolved.  CONDITION AT THE TIME OF DISCHARGE:  The patient is stable.  She is without any complaints at this time.  PHYSICAL EXAMINATION:  VITAL SIGNS:  Her vital signs today are as follows.  Temperature is 98, heart rate is 77, blood pressure 130/66, respiratory rate 19, O2 sats are 97% on room air. GENERAL:  The patient is well appearing. HEENT:  She is normocephalic, atraumatic.  Pupils are equal,  round, and reactive to light and accommodation.  Extraocular movements are intact. Oropharynx is moist.  No exudate, erythema, or lesions are noted. NECK:  Trachea is midline.  No masses, no thyromegaly, no JVD, no carotid bruit. RESPIRATORY:  The patient has a normal respiratory effort, equal excursion bilaterally.  No wheezing or rhonchi noted. CARDIOVASCULAR:  She has got a normal S1 and S2.  No murmurs, rubs, or gallops are noted.  PMI is nondisplaced.  No heaves and no thrills on palpation. ABDOMEN:  Obese, soft, nontender, nondistended.  No masses, no hepatosplenomegaly. EXTREMITIES:  Showed no clubbing, cyanosis, or edema. PSYCHIATRIC:  She is alert and oriented x3.  Good insight and cognition. Good recent and remote recall. NEUROLOGICAL:  There are no focal neurological deficits noted. MUSCULOSKELETAL:  She has no warmth, swelling, or erythema around her joints.  There is spinal tenderness noted.  The patient does have a soft cervical collar which she has been instructed to wear by her orthopedic surgeon, however, at this point, she has no pain and her range of motion is limited as a result of her surgery.  DIETARY RESTRICTIONS:  The patient should be on a soft diet.  FOLLOWUP:  She is to follow up with [facility] with her primary care physician, Dr. Dereck Leep practitioner, [place] within 3-5 days.  As a reminder, I have spoken with the providers at the office and they are to refer Ms. Dawe for her screening/diagnostic colonoscopy, both for general screening and to evaluate for her significant iron deficiency anemia.  RECOMMENDATIONS:  The patient should have IV iron infusion either through Dr. Haywood Pao office or with the Outpatient  Day Infusion Service.  PHYSICAL RESTRICTIONS:  As previously recommended by Orthopedic Surgery with regard to her spinal surgeries.  Total time for this discharge process including face-to-face time 40 minutes  approximately.     Altha Harm, MD     MAM/MEDQ  D:  09/01/2010  T:  09/02/2010  Job:  [id number]  cc:   Mary Sella. Andrey Campanile, MD Jordan Hawks Elnoria Howard, MD Candyce Churn. Allyne Gee, M.D.  Electronically Signed by Marthann Schiller MD on 09/21/2010 01:53:33 PM

## 2010-09-27 NOTE — Op Note (Signed)
NAME:  Sara Fox, Sara Fox                ACCOUNT NO.:  [id number]   MEDICAL RECORD NO.:  [id number]          PATIENT TYPE:  INP   LOCATION:  5008                         FACILITY:  [facility]   PHYSICIAN:  Burnard Bunting, M.D.    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   PROCEDURE:  Right total knee arthroplasty revision.   SURGEON:  Burnard Bunting, MD   ASSISTANT:  [name], PA-C   ANESTHESIA:  General endotracheal.   ESTIMATED BLOOD LOSS:  100 mL.   DRAINS:  Hemovac x1.   INDICATIONS:  Sara Fox is a 70 year old patient with loosening and  varus collapse of the right total knee replacement.  She presents now  for operative management after progressive collapse and explanation of  risks and benefits.   TOURNIQUET TIME:  2 hours at 300 mmHg, followed by tourniquet let down,  followed by reapplication of tourniquet after 30 minutes for another 30  minutes at 300 mmHg.   PROCEDURE IN DETAIL:  The patient was brought to the operating room  where general endotracheal anesthesia was induced.  Preoperative  antibiotics administered.  Right leg was prepped with DuraPrep solution  and draped in a sterile manner after pre-scrubbing with alcohol and  Betadine which allowed the air to dry.  Sara Fox was used for scrubbing the  operative field.  Time out was called.  Right leg was elevated and  exsanguinated with an Esmarch wrap.  Tourniquet was inflated.  Anterior  approach to the knee was utilized.  Median parapatellar approach was  made.  Precise location of the arthrotomy was then noted at the  arthrotomy site with #1 Vicryl suture.  Patella was everted.  Synovectomy was performed.  There was no evidence of infection.  Tibia  was grossly loose.  The tibia and its cement mantle was removed after  exposure of the proximal tibial plateau.  Posterior and lateral  retractors were carefully placed.  The stacked osteotomes were used to  remove the tibia and cement was also removed.  Fibrous tissue was  subsequently removed.  The PCL attachment was also removed.  Care was  taken to avoid injury to the posterior neurovascular structures.  The  femoral component was then removed using osteotomes.  It was removed and  locked with minimal bone loss.  At this time, the tibia was prepared by  hand reaming the tibial metaphysis and diaphysis.  A clean-up cut was  performed and it was determined that the step-cut would be needed to  compensate for the deformity on the medial tibial plateau.  This was  also performed with the collateral and cruciate ligaments with the  collateral ligaments well protected.  A sleeve was also utilized for  rotational stability.  Trial tibial component stem was placed and good  bony contact was achieved.  At this  time, femur was prepared by first  hand reaming the canal and then performing a distal femoral cut.  It was  noted that the distal femoral augments as well as posterior augments  would be required.  Posterior and anterior chamfer cuts were performed.  Trial femur was then placed and with the trial femur and trial tibia in  place, the patient had excellent stability varus-valgus stress at 0, 30  and 90-degrees with 15 mm spacer.  Trial components were removed.  True  components were placed.  Patella tracked.  Patella had excellent  tracking with no-thumb technique. __________ was chosen for extra  stability because of the depth of the augment.  Tourniquet was released  at this time, bleeding points were controlled with electrocautery and  thorough irrigation with 3 liters of irrigating solution was then  performed.  Tourniquet was then elevated again and the prosthesis was  cemented into  position with excess cement removed.  The antibiotics were  placed in the cement.  There was no evidence of infection.  Tourniquet  was again released after cement hardening.  Same stability parameters  were maintained.  The leg looked well aligned.  Hemovac drain was  placed.  The arthrotomy was closed using #1 Vicryl suture followed by  interrupted inverted 0 Vicryl suture, 2-0 Vicryl suture and skin  staples.  Solution of Marcaine and clonidine was injected to the knee.  The patient tolerated the procedure well without immediate  complications.  Sara Fox's assistance was required at all times  during the case for retraction of important neurovascular structures and  limb positioning where assistance was a medical necessity.      Burnard Bunting, M.D.  Electronically Signed     GSD/MEDQ  D:  02/11/2008  T:  02/12/2008  Job:  [id number]

## 2010-09-30 NOTE — Op Note (Signed)
NAME:  Sara, Fox NO.:  [id number]   MEDICAL RECORD NO.:  [id number]          PATIENT TYPE:  INP   LOCATION:  5039                         FACILITY:  [facility]   PHYSICIAN:  Burnard Bunting, M.D.    DATE OF BIRTH:  September 04, 1940   DATE OF PROCEDURE:  02/06/2006  DATE OF DISCHARGE:                               OPERATIVE REPORT   PREOPERATIVE DIAGNOSIS:  Left knee arthritis.   POSTOPERATIVE DIAGNOSES:  Left knee arthritis.   PROCEDURES:  Left total knee replacement.   SURGEON ATTENDING:  Burnard Bunting, M.D.   ASSISTANT:  [name], P.A.   ANESTHESIA:  General endotracheal.   ESTIMATED BLOOD LOSS:  150 mL.   DRAINS:  Hemovac x1.   TOURNIQUET TIME:  1 hour 45 minutes at 300 mmHg.   INDICATIONS:  Sara Fox is a 70 year old patient with end-stage left  knee arthritis who presents for operative management.   COMPONENTS:  DePuy cemented posterior stabilized 4 femur, 4 tibia, 10  polyethylene spacer, 38 patella, 3 peg, computer-assisted.   PROCEDURE IN DETAIL:  The patient was brought to the operating room  where general endotracheal anesthesia was induced.  Preoperative  antibiotics were administered.  The left leg, foot and knee were prepped  in DuraPrep solution and draped in a sterile manner.  __________  was  identified between the medial and lateral margins of the patella and the  patellar tendon.  Anterior approach to the knee was utilized after  Esmarch wrap and tourniquet inflation.  Skin and subcutaneous tissue  were sharply divided,  medial parapatellar approach.  Medial  parapatellar approach to the knee was utilized.  Patellofemoral ligament  was released.  The fat pad was partially excised.  Soft tissue was  removed from the anterior distal aspect of the femur.  Osteophytes were  removed.  ACL, PCL were resected.  Minimal soft tissue sleeve was  elevated off the medial tibial plateau.  At this time, 2 pins were  placed in the distal  anterior-medial femur and proximal anterior-medial  tibia.  Computer registration of hip center, __________  and lateral  access in multiple points across the knee were obtained.  Patient had 11  degrees of valgus and a 7-degree flexion contracture preoperatively.   At this time, in accordance with the preoperative templating, computer-  generated templating, tibial cut was performed with collateral and  posterior structures protected.  Following tibial resection, attention  was checked on both extension and flexion for ligament balancing  purposes.  Distal femoral cut was then performed.  Chamfer and box cuts  were then performed.  The tibia was then keel punched.  Patella was then  resected free hand, from 24 mm to 12 mm.  A 38-mm patella trial was  placed.  Trial components were placed.  Patient achieved 0.5 degrees of  varus and 3 degrees of hyperextension with a 10 poly spacer and good  ligament stability noted.  There was no lift-off and good patellar  tracking with light-touch technique.  At this time, components were  removed and cut bony surfaces  were irrigated.  Components were then  cemented back into position with excess cement removed.  The same  stability and range of motion parameters were maintained.   At this time tourniquet was released.  Bleeding points encountered were  controlled with the use of electrocautery.  Thorough irrigation was  performed.  The Hemovac drain was placed.  The median parapatellar  arthrotomy was then closed with the knee over a bolster, using a #1  Vicryl suture.  It should be noted that the precise location of the  arthrotomy was marked at the time of the initial arthrotomy with a #1  Vicryl suture and this was reapproximated precisely with subsequent  simple #1 Vicryl sutures.  Layer closure was then performed using 0  Vicryl suture, 2-0 Vicryl suture and skin staples.  The pin sites were  irrigated and closed using 3-0 nylon sutures.   Solution of about 30 mL  of Marcaine was placed into the knee for postop pain control.  Patient  was then placed in a well-padded knee immobilizer.  She tolerated the  procedure well without immediate complication.      Burnard Bunting, M.D.  Electronically Signed     GSD/MEDQ  D:  02/06/2006  T:  02/06/2006  Job:  [id number]

## 2010-09-30 NOTE — Discharge Summary (Signed)
NAME:  [name], HAVRILLA NO.:  [id number]   MEDICAL RECORD NO.:  [id number]           PATIENT TYPE:   LOCATION:                                 FACILITY:   PHYSICIAN:  Burnard Bunting, M.D.         DATE OF BIRTH:   DATE OF ADMISSION:  05/29/2006  DATE OF DISCHARGE:  06/01/2006                               DISCHARGE SUMMARY   DISCHARGE DIAGNOSIS:  Right shoulder arthritis.   SECONDARY DIAGNOSES:  1. Gastroesophageal reflux.  2. Gastric outlet obstruction.  3. Osteoarthritis.  4. A history of bilateral knee replacement.   OPERATIONS/PROCEDURES:  Right shoulder hemiarthroplasty May 30, 2006.   HOSPITAL COURSE:  Sara Fox is a 70 year old female with right  shoulder arthritis.  She underwent right shoulder hemiarthroplasty  May 30, 2006.  She tolerated the procedure well without immediate  complications.  Hematocrit was 29 at the completion of the case.  She  had a little chest pain the following day and was seen by Cardiology,  cardiac workup was negative.  She was seen by Therapy to begin working  on pendulum range of motion exercises.  She ruled out for myocardial  infarction.  She was mobilizing her hand well at the time of discharge.  She had no other issues and was discharged home in good condition  June 01, 2006.  Incision was intact at that time.   DISCHARGE MEDICATIONS:  Include:  1. Prevacid 30 daily.  2. Maxzide 25 daily.  3. Detrol LA 4 mg p.o. daily.  4. Iron.  5. Centrum daily.  6. Aspirin 325 daily.  7. Percocet 1-2 p.o. q.2-4h. p.r.n. pain.   She will follow up with me in 7 to 10 days for suture removal.  She will  continue with daily pendulum exercises.      Burnard Bunting, M.D.  Electronically Signed     GSD/MEDQ  D:  07/25/2006  T:  07/26/2006  Job:  [id number]

## 2010-09-30 NOTE — Op Note (Signed)
NAME:  Sara Fox, Sara Fox                          ACCOUNT NO.:  [id number]   MEDICAL RECORD NO.:  [id number]                   PATIENT TYPE:  INP   LOCATION:  5733                                 FACILITY:  [facility]   PHYSICIAN:  Jimmye Norman III, M.D.               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   PROCEDURE:  Anterior and posterior truncal vagotomies.  Antrectomy.  Feeding, Witzel-type jejunostomy tube.   SURGEON:  Jimmye Norman, M.D.   ASSISTANT:  Gabrielle Dare. Janee Morn, M.D.   ANESTHESIA:  General endotracheal.   ESTIMATED BLOOD LOSS:  75 mL.   COMPLICATIONS:  None.   CONDITION:  Stable.   INDICATIONS FOR PROCEDURE:  The patient is a 70 year old female with the  recurrent gastric outlet obstruction who comes in for a vagotomy and  antrectomy.   FINDINGS:  The patient had a markedly dilated stomach which is probably  twice its normal size. Anterior and posterior vagus nerves were in their  usual anatomic positions. The spleen was okay. There was no evidence of  gallstones on examination and therefore cholecystectomy was not performed.  There was no evidence of any pancreatic masses or any paraduodenal masses  causing obstruction.   DESCRIPTION OF PROCEDURE:  The patient was taken to the operating room and  placed on the table in the supine position. After an adequate endotracheal  anesthetic was administered, she was prepped and draped in the usual sterile  fashion exposing the midline of the abdomen.  A supraumbilical midline  incision was made from the xiphoid down to just to the right of the  umbilicus.  It was taken down to and through the midline fascia using  electrocautery.  Once we entered peritoneal  cavity, we placed an Omni  retractor in place.  This was with the patient in reversed Trendelenburg  position.  This allowed [place] to expose the upper portions of the abdomen.   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  These adhesions were taken down using electrocautery.  On the left side, we  took down the left triangular hepatic ligament and used the Omni retractor  in place in order to expose the peritoneum overlying the crus of the  diaphragm.  We dissected down into this peritoneum overlying the proximal  stomach and distal esophagus.  We were able to circumferentially get around  the esophagus and therefore put a Penrose drain there.  With traction being  placed on the stomach, we were able to feel the anterior and the posterior  vagus nerves.  A right angle  clamp was used to circumferentially get around  the anterior nerve which was clipped proximally and distally and  subsequently, a 2 cm segment removed.  As we rolled the stomach to the right  and anteriorly, we could feel and also see the posterior vagus nerve which  was also resected between clips and a right angle clamp.  We removed both  nerves and sent them for specimens.   Once the vagus nerves were removed, we went ahead with exposing our stomach  for the gastrectomy.  The pyloris appeared to be somewhat scarred, but not  so much that it made the antrectomy difficult or the closure of the duodenum  solved the problem.  We got into the lesser sac through the lesser curvature  of the stomach and took down adhesions in the lesser sac. The left gastric  artery was taken down on the lesser curvature using  a right Kelly clamp and  an 0 silk tie.  We also took down the gastroepiploic from the greater  curvature of the stomach from about 1/3 down in the stomach down to the  pyloris and below.  Blood vessels around the pyloris  and the proximal first  portion of the duodenum were taken down between Kelly clamps and 2-0 and 3-0  silk ties.  Once we had completely exposed the stomach from this portion  now, we used a TA90 with 4.8 mm stapler across the proximal stomach and then  transected it leaving the staple line along the proximal stomach. A large  Kocher clamp was used to clamp off the distal stomach as we held it up for a  TA55 staple to be placed across the first portion of the duodenum. We  oversewed this portion of the duodenum after we fired the TA55 stapler which  had 3.5 mm staples.  We oversewed with 2-0 silk sutures.  Once this was  done, we performed a side-to-side posterior wall Billroth II,  gastrojejunostomy using GIA55 stapler with 3.5 mm staples.  The TA55 stapler  was used to close off the subsequent gastrojejunostomy enterotomy. There was  a small opening in the distal portion of the anastomosis which was oversewn  with 2-0 silk sutures.   We secured the staple line and also secured the jejunostomy loop through the  retrocollar position using 2-0 silk sutures.  Once this was done and it was  intact, we had a good anastomosis.  NG tube was not caught in any of the  staple lines and was proximal to the anastomosis.   Once we had completed, our gastrojejunostomy and oversewn our duodenal  stump, we did a feeding jejunostomy Witzel-type and brought out the left  upper quadrant of the abdomen.  3-0 silk pursestring suture was made on the  antimesenteric portion of the jejunum approximately 10 cm distal to the  gastrojejunostomy.  We then passed a 10 French Red Robinson catheter into  the distal jejunum, secured it in place with a pursestring suture, and then  we did a Witzel-type serosal stitch along the proximal catheter for about 5  cm and then brought the rest out through the anterior abdominal wall which we secured in place with a 3-0 nylon.  We irrigated it with saline solution.  We will  start tube feedings shortly.  Once it was secured in place, we  irrigated with saline and made sure there was no bleeding from either the  liver, spleen, or other parts that had been dissected away. There was  minimal bleeding.  All needle, sponge, and instrument count correct.  We  closed the abdomen.   The midline fascia was closed using a running #1 PDS suture, then the skin  was closed using stainless steel staples.  Sterile dressings were applied.                                               Kathrin Ruddy, M.D.    JW/MEDQ  D:  01/08/2003  T:  01/09/2003  Job:  [id number]

## 2010-09-30 NOTE — Consult Note (Signed)
NAME:  Sara Fox, Sara Fox                ACCOUNT NO.:  [id number]   MEDICAL RECORD NO.:  [id number]          PATIENT TYPE:  INP   LOCATION:  2550                         FACILITY:  [facility]   PHYSICIAN:  Armanda Magic, M.D.     DATE OF BIRTH:  Jul 02, 1940   DATE OF CONSULTATION:  05/29/2006  DATE OF DISCHARGE:                                 CONSULTATION   REFERRING PHYSICIAN:  Burnard Bunting, M.D.   CHIEF COMPLAINT:  Chest pain   HISTORY OF PRESENT ILLNESS:  This is a 70 year old black female with a  history of chest pain who underwent Cardiolite in 2001 for preop  clearance for a knee surgery and this was reportedly abnormal.  She  underwent cardiac catheterization which revealed normal coronary  arteries except for a 30% posterior lateral branch.  She underwent right  shoulder hemiarthroplasty today and while in the PACU, complained of  left chest and left scapular pain.  She denied any palpitations,  dyspnea, dizzy or syncope.  She says she has never had any chest pain in  the past and she felt this was gas because as soon as she burped, it  away.  We are now asked to evaluate.  She is currently pain-free.   MEDICATIONS:  1. Prevacid 30 mg daily.  2. Maxzide 25 mg daily.  3. Detrol LA 4 mg daily.  4. Iron.  5. Ibuprofen 800 mg b.i.d.  6. Centrum daily.  7. Aspirin 325 mg a day.   SOCIAL HISTORY:  She is a smoker.  She denies any alcohol use.  No  illicit drug use.   FAMILY HISTORY:  Diabetes, hypertension and coronary disease.   ALLERGIES:  MORPHINE, VIOXX, CODEINE and CELEBREX.   PAST MEDICAL HISTORY:  1. Osteoarthritis.  2. Gastroesophageal reflux.  3. Gastric outlet obstruction.   PAST SURGICAL HISTORY:  1. Status post partial antrectomy with Billroth II.  2. Status post total abdominal hysterectomy and BSO.  3. Status post left total knee replacement in 2007.   REVIEW OF SYSTEMS:  Other than what is stated is negative.   PHYSICAL EXAMINATION:  VITAL SIGNS:  Blood  pressure 120/60, pulse 80,  respirations 12.  HEENT:  Benign.  NECK:  Supple without lymphadenopathy, carotid upstrokes +2 bilaterally,  no bruits.  LUNGS:  Clear to auscultation throughout.  HEART:  Regular rate and rhythm.  No murmurs, rubs or gallops.  Normal  S1 and S2.  ABDOMEN:  Soft, nontender, nondistended with active bowel sounds.  No  hepatosplenomegaly.  EXTREMITIES:  Mild lower extremity edema, left greater than right, but  no redness.  NEUROLOGIC:  Alert and oriented x3.   LABORATORY DATA:  Sodium 138, potassium 4.8 chloride 107, bicarb 25, BUN  10, creatinine 0.7, glucose 97. INR 1.  White cell count 6, hemoglobin  11.5, hematocrit 34.3, platelet count 368.   EKG shows sinus rhythm at 81 beats per minute with no ST-T wave  abnormalities.   ASSESSMENT:  1. Atypical chest pain which is most likely secondary to      gastrointestinal etiology with gas now resolved after  belching.      EKG is nonischemic.  2. 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  3. Left lower extremity swelling.  4. Osteoarthritis.  5. Status post right shoulder hemiarthroplasty.   PLAN:  1. Check lower extremity venous ultrasound to rule out DVT.  2. CPK-MB and troponin q.8h. x3; if negative, would plan outpatient      Cardiolite once recovers from surgery.  I doubt this is due to      underlying progression of coronary disease based on the patient's      symptoms.      Armanda Magic, M.D.  Electronically Signed     TT/MEDQ  D:  05/29/2006  T:  05/30/2006  Job:  [id number]

## 2010-09-30 NOTE — Cardiovascular Report (Signed)
[place]  Patient:    Sara Fox, Sara Fox               MRN: [id number] Proc. Date: 06/27/99 Adm. Date:  [id number] Disc. Date: [id number] Attending:  Mora Appl CC:         Anna Genre. Little, M.D.                        Cardiac Catheterization  PROCEDURES:  Left heart catheterization.  INDICATION FOR PROCEDURE:  Persistent chest pain.  PREOPERATIVE EVALUATION:  Reversible ischemia in the anterior inferior apical wall with a fixed inferior defect.  REFERRING PHYSICIAN:  Anna Genre. Little, M.D.  DESCRIPTION OF PROCEDURE:  After obtaining written and informed consent, the patient was brought to the cardiac catheterization laboratory in the postabsorptive state. Preoperative sedation was achieved using IV Versed and IV fentanyl. Local anesthesia was achieved using 1% Xylocaine. The right femoral head was identified using radiographic technique. Selective coronary angiography was performed using a JL4/JR4 Judkins catheter. Nonionic contrast was used and was hand injected for he coronaries, powered injected for the single-time ventriculogram. All catheters exchanges were made over a guide wire. The hemostasis sheath was placed following each catheter exchange. Following the procedure, there was no critical coronary  artery disease. The hemostasis sheath was then removed. Hemostasis was achieved  using digital pressure.  FINDINGS: The aortic pressure was 155/81. LV pressure:  161/16.  Single-time ventriculogram revealed anterior wall hypokinesis. The ejection fraction was approximately 50%. There was no mitral regurgitation that appeared.  Coronary angiography:  The left main coronary artery was long. It bifurcated into the left anterior descending and circumflex vessel. There was no significant disease in the left main coronary artery.  The left anterior descending gave rise to multiple small diagonals, small septals and an  apical recurrent branch. There was no significant disease in the left anterior descending.  The circumflex vessel was codominant giving rise to a large OM1, small OM2, small OM3, and PL branch. There was no critical disease recognized in the circumflex vessel.  The right coronary artery was codominant and had an enlarged conus branch. There was a 50 to 60% stenosis in the PDA branch.  IMPRESSION: 1. Noncritical disease involving the posterior descending artery branch. 2. Nonanterior wall hypokinesis.  RECOMMENDATION:  Medical management of the PDA lesion. DD:  06/27/99 TD:  06/27/99 Job: [id number]

## 2010-09-30 NOTE — H&P (Signed)
NAME:  Sara Fox, Sara Fox                          ACCOUNT NO.:  [id number]   MEDICAL RECORD NO.:  [id number]                   PATIENT TYPE:  INP   LOCATION:  5733                                 FACILITY:  [facility]   PHYSICIAN:  Jimmye Norman III, M.D.               DATE OF BIRTH:  [birth year]   DATE OF ADMISSION:  01/07/2003  DATE OF DISCHARGE:                                HISTORY & PHYSICAL   CHIEF COMPLAINT:  The patient is a 70 year old female with gastric outlet  obstruction who comes in for surgery.   HISTORY OF PRESENT ILLNESS:  I was contacted about this patient by Dr. Roosvelt Harps from the endoscopy unit where the patient had just undergone an  upper GI endoscopy for possible pyloric dilatation which he had received on  2 previous occasions. The last one was about a week ago and she reobstructed  a couple of days ago and has not been able to eat anything since that time,  has been vomiting; and apparently over the last week has lost about 30  pounds.  Her previous dilatations were several weeks ago and she did well  with that. Part of her GE reflux disease has been attributed to a gastric  outlet obstruction.   The patient has a known history of peptic ulcer disease back in the 1970's  which she treated with Zantac, with good success by her account, and had not  had any problems until 2 years ago when she was afflicted with the  gastroesophageal reflux disease.  Because of the complete obstruction of her  duodenum and pylorus, surgical consultation was obtained for likely  antrectomy and vagotomy.   Her past history is unremarkable for cardiac disease, pulmonary disease,  liver disease, or renal disease.  She is not a smoker.  She has no pulmonary  disease and no renal disease. She does have a history of hypertension and  arthritis.   PAST SURGICAL HISTORY:  She has had a total right knee in either 2000 or  2002.  She has had multiple arthroscopies of her left knee.  She  has had a  total abdominal hysterectomy, a bilateral salpingo-oophorectomy in the 1970s  and she has had bilateral breast biopsies.   MEDICATIONS:  1. Zetia 10 mg a day.  2. Nexium 40 mg a day.  3. Ibuprofen unknown dosage p.r.n. for arthritis.  4. Aspirin 325 mg a day.  5. Atenolol 225 mg p.o. daily.  6. Maxzide 75/50 one tablet p.o. daily.  7. Protonix 40 mg a day.  8. Voltaren 50 mg a day.  9. Wellbutrin SR 150 mg a day.  10.      Zocor.   ALLERGIES:  She is allergic to VIOXX and CELEBREX both of which cause the  patient to have significant GI intolerance.  MORPHINE and CODEINE cause her  to swell.   REVIEW OF SYSTEMS:  She has not had a bowel movement over the last week.  No  blood in her stools. She has vomited, but not vomited any blood; and she  reports a 30+ pound weight loss over the last week.   PHYSICAL EXAMINATION:  GENERAL: On exam she appears to be healthy, although  slightly pale for an African-American female.  No acute distress.  VITAL SIGNS:  Her temperature was 97; pulse 72; blood pressure 160/68.  HEENT:  She is normocephalic, atraumatic.  She has no scleral icterus.  Her  eyes appear to be a little bit sunken and perhaps she appears to be a little  bit dehydrated.  NECK:  Supple with no palpable masses, no bruits. She has no thrills.  CHEST:  Clear to auscultation and percussion.  CARDIAC:  Regular rhythm and rate with no murmurs, gallops, thrills, or  heaves.  ABDOMEN:  Soft. She has an old lower midline scar from her previous  abdominal hysterectomy.  No upper abdominal scars. She has no palpable liver  or spleen and no palpable masses.  She has some mild tenderness in the  epigastrium and the right upper quadrant with deep palpation.  RECTAL AND PELVIC: Exams were not performed.  EXTREMITIES:  She has good femoral pulses and also good dorsalis pedis and  posterior tibial pulses bilaterally.  NEUROLOGIC: Cranial nerves II-XII are grossly intact.  We did  not examine  the otic membranes.   LABORATORY STUDIES:  Her hemoglobin is 12, hematocrit 36, her white count is  5.2.  Amylase was normal.  Her electrolytes are within normal limits with a  BUN of 7 and creatinine of 0.9.  Calcium is 9.2.   IMPRESSION:  The patient has pyloric channel stenosis and besides that has  gastric outlet obstruction.  They were not even able to get past the pylorus  on this particular endoscopy.  Because of this, she also has reflux disease,  but more importantly she is unable to eat, she is malnourished and she  requires a bypass operation.   PLAN:  The plan is to take the patient to the OR tomorrow for a partial  gastrectomy, a vagotomy, and likely a Billroth II reconnection. This will be  done under general anesthesia and also the patient will likely getting a  feeding jejunojejunostomy put in place for nutrition postoperatively.  The  risks and benefits of this procedure have been explained to the patient,  including the risks of possibly having to receive blood.  She will be typed  and screened.                                                Kathrin Ruddy, M.D.    JW/MEDQ  D:  01/07/2003  T:  01/07/2003  Job:  [id number]   cc:   Althea Grimmer. Luther Parody, M.D.  [address], [address]  [place]  [place] [address]  Fax: [phone]   Anna Genre. Little, M.D.  [address]  [place]  [place] [address]  Fax: [phone]

## 2010-09-30 NOTE — Discharge Summary (Signed)
   NAME:  Sara Fox, Sara Fox                          ACCOUNT NO.:  [id number]   MEDICAL RECORD NO.:  [id number]                   PATIENT TYPE:  INP   LOCATION:  5733                                 FACILITY:  [facility]   PHYSICIAN:  Jimmye Norman III, M.D.               DATE OF BIRTH:  1940/12/13   DATE OF ADMISSION:  01/07/2003  DATE OF DISCHARGE:  01/14/2003                                 DISCHARGE SUMMARY   DISCHARGE DIAGNOSIS:  Gastric outlet obstruction with severe peptic ulcer  disease.   OPERATION/PROCEDURE:  Partial antrectomy with a Billroth-II reanastomosis;  surgeon was Dr. Lindie Spruce.   DISPOSITION:  The patient was discharged to home in the care of her family.   DISCHARGE MEDICATIONS:  The patient's medications included:  1. Protonix 40 mg p.o. daily.  2. Percocet p.r.n. for pain.   FOLLOW UP:  The patient is to return to see me in two weeks.   DISCHARGE DIET:  The patient's diet is low-concentrated sweets.   DISCHARGE CONDITION:  Stable.   BRIEF SUMMARY OF HOSPITAL COURSE:  The patient was admitted on August 25th  with a history of gastric outlet obstruction after an upper GI by Dr.  Luther Parody, which showed near complete obstruction of the pylorus.  She had  severe reflux to go along with this.  She was taken to surgery the day after  admission at which time an antrectomy and truncal vagotomy were performed  along with a Billroth-II anastomosis and a jejunostomy feeding tube.  She  was on J-tube feedings for the first several days postop and then was  subsequently was changed back to a diet, which she tolerated well and was  subsequently able to go home on postop day number six.  She was eating well.  There was no evidence of malignant pathology.  Peripheral nerves were  identified on the truncal vagotomy.  She is to return to see me in two  weeks.                                                  Kathrin Ruddy, M.D.    JW/MEDQ  D:  02/09/2003  T:  02/10/2003  Job:   [id number]

## 2010-09-30 NOTE — Op Note (Signed)
NAME:  Sara Fox, Sara Fox                ACCOUNT NO.:  [id number]   MEDICAL RECORD NO.:  [id number]          PATIENT TYPE:  INP   LOCATION:  2550                         FACILITY:  [facility]   PHYSICIAN:  Burnard Bunting, M.D.    DATE OF BIRTH:  03-28-1941   DATE OF PROCEDURE:  05/29/2006  DATE OF DISCHARGE:                               OPERATIVE REPORT   PREOPERATIVE DIAGNOSIS:  Right shoulder avascular necrosis.   POSTOPERATIVE DIAGNOSIS:  Right shoulder avascular necrosis.   PROCEDURE:  Right shoulder hemiarthroplasty.   SURGEON:  Burnard Bunting, M.D.   ASSISTANT:  Jerolyn Shin. Tresa Res, M.D.   ANESTHESIA:  General endotracheal.   ESTIMATED BLOOD LOSS:  150.   DRAINS:  None.   INDICATIONS:  [name] is a 70 year old patient with end-stage right  shoulder arthritis, who presents now for operative management after  failure of conservative treatment.   IMPLANTS:  Biomet 14 standard stem with a 48 x 19 head.   PROCEDURE IN DETAIL:  The patient was brought to the operating room,  where general endotracheal anesthesia was induced.  Preoperative IV  antibiotics were administered.  The right shoulder was prepped with  DuraPrep solution, including the hand, and draped in a sterile manner.  The neck was in neutral position with the beach-chair, with the left arm  well padded.  Following prepping and draping, incision was made  beginning at the lateral aspect of the coracoid process, extending  distally.  The skin and subcutaneous tissues were sharply divided.  The  deltopectoral interval was identified.  The cephalic vein was mobilized  laterally.  Self-retaining retractor was placed.  Clavipectoral fascia  was incised.  The conjoined tendon was mobilized medially.  The  bicipital groove was incised.  The subscapularis was taken off of the  lesser tuberosity and tagged with four #2 FiberWire sutures.  The  axillary nerve was palpated at this time and protected at all times  during the  remaining portion of the case.  The head was dislocated.  The  humeral head cut was then made with the extramedullary guide in  approximately 25 to 30 degrees of retroversion.  The canal was then  reamed up to size 14.  A broach was then placed and it was well seated.  Trial reduction with the heads in accordance with preoperative  templating and with templating off the native head was performed.  Two  trials with 2 different size heads were performed.  With the head  chosen, the patient had about 50% inferior and posterior translation.  She could raise her hand up to her head and she had excellent internal  and external rotation with good stability and could put her hand on her  stomach without instability.  At this time, trial components were  removed.  True components were placed and seated.  An excellent press-  fit was obtained.  The subscap was then repaired through a pair of  direct drill holes, using four #2 FiberWire sutures and reinforced with  #1 Vicryl sutures.  Thorough irrigation was performed before and after  subscap  closure.  The deltopectoral interval was closed using  interrupted #1 Vicryl suture, followed by interrupted, inverted 2-0  Vicryl sutures and skin staples.  A bulky dressing was placed.  The  patient tolerated the procedure well, without immediate complications.      Burnard Bunting, M.D.  Electronically Signed     GSD/MEDQ  D:  05/29/2006  T:  05/29/2006  Job:  [id number]   cc:   Jerolyn Shin. Tresa Res, M.D.

## 2010-09-30 NOTE — Discharge Summary (Signed)
NAME:  Sara Fox, Sara Fox                ACCOUNT NO.:  [id number]   MEDICAL RECORD NO.:  [id number]          PATIENT TYPE:  INP   LOCATION:  5008                         FACILITY:  [facility]   PHYSICIAN:  Burnard Bunting, M.D.    DATE OF BIRTH:  02/16/1941   DATE OF ADMISSION:  02/11/2008  DATE OF DISCHARGE:  02/15/2008                               DISCHARGE SUMMARY   ADMISSION DIAGNOSES:  1. 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  2. Chronic obstructive pulmonary disease.  3. Gastroesophageal reflux disease.  4. Osteoarthritis.  5. Hypertension.   DISCHARGE DIAGNOSES:  1. 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  2. Chronic obstructive pulmonary disease.  3. Gastroesophageal reflux disease.  4. Osteoarthritis.  5. Hypertension.  6. Mild posthemorrhagic anemia.   PROCEDURE:  On February 11, 2008, the patient underwent revision of  right total knee arthroplasty performed by Dr. August Saucer, assisted by Maud Deed, PA-C, under general anesthesia.   CONSULTATIONS:  None.   BRIEF HISTORY:  The patient is a 70 year old female with loosening and  varus collapse of the right total knee arthroplasty which was performed  in 2001.  She has had considerable pain and now has progressive  dysfunction secondary to the knee replacement loosening.  It was felt  she would benefit from surgical intervention and was admitted for the  procedure as stated above.   BRIEF HOSPITAL COURSE:  The patient tolerated the procedure under  general anesthesia without complications.  Postoperatively, she was  placed on Coumadin for DVT prophylaxis.  Adjustments in Coumadin and  Lasix made according to daily protimes by the pharmacist at [facility].  The patient was treated by the physical therapy department  for ambulation and gait training, range of motion, stretching, and  strengthening exercises of the lower extremity.  She utilized CPM  machine to the operative  extremity.  Prior to discharge, the patient was  ambulating utilizing a walker with minimal assistance as much as 130  feet.  She was weightbearing as tolerated.  The patient was seen by the  occupational therapist for ADLs and tolerated this well.  Pain was  controlled initially by PCA analgesics and she was weaned to p.o.  analgesics without difficulty.  Prior to discharge, the patient was  voiding independently after her Foley catheter was discontinued.  She  was taking a regular diet.  At discharge, she was afebrile and vital  signs were stable.   PERTINENT LABORATORY VALUES:  On admission, CBC with hemoglobin 11.4 and  hematocrit 34.4.  at discharge, hemoglobin 8.2 and hematocrit 24.  Admission INR 0.9, at discharge 1.3.  Chemistry studies on admission  within normal limits.  On February 13, 2008, sodium 134, potassium 3.3,  and glucose 125 with remaining values within normal limits.  Urinalysis  on admission negative for urinary tract infection.  Urine culture on  February 07, 2008, with greater than 100,000 colonies of multiple  bacteria, amorphus types present, none predominant.  The patient did  receive perioperative antibiotics and was asymptomatic for urinary tract  infection at discharge.  PLAN:  The patient was discharged to her home.  Arrangements made for  home health physical therapy and durable medical equipment.  Coumadin  will be managed by the home health agency as well.  She will increase  her activity at home slowly.  CPM machine to be used 2 hours out of 8  hours.  She will keep her incision dry and clean at all times.  The  patient will follow up with Dr. August Saucer in the office in approximately 10  days and has been advised to call to arrange the appointment.  She was  given prescriptions for Percocet 1-2 every 4-6 hours as needed for pain  and Coumadin per pharmacy protocol.  Also, Robaxin 500 mg 1 every 8  hours as needed for spasm.  She will continue on her home  medications as  taken prior to admission with the exception of Vicodin, ibuprofen, and  aspirin.  She was given a medication reconciliation form with these  instructions.  All questions encouraged and answered.  The patient will  call should she have questions prior to her return office visit.   CONDITION ON DISCHARGE:  Stable.      [name], P.A.      Burnard Bunting, M.D.  Electronically Signed    SMV/MEDQ  D:  03/19/2008  T:  03/20/2008  Job:  [id number]

## 2010-09-30 NOTE — Discharge Summary (Signed)
NAME:  Sara Fox, Sara Fox                ACCOUNT NO.:  [id number]   MEDICAL RECORD NO.:  [id number]          PATIENT TYPE:  INP   LOCATION:  5039                         FACILITY:  [facility]   PHYSICIAN:  Burnard Bunting, M.D.    DATE OF BIRTH:  1941/02/08   DATE OF ADMISSION:  02/06/2006  DATE OF DISCHARGE:  02/11/2006                               DISCHARGE SUMMARY   DISCHARGE DIAGNOSIS:  Left knee arthritis.   SECONDARY DIAGNOSES:  1. Osteoarthritis.  2. History of cancer.  3. Acid reflux.  4. Bladder problems.   OPERATIONS AND PROCEDURES:  Left total knee replacement on February 06, 2006.   HOSPITAL COURSE:  [name] 70 year old patient with end-stage left  knee arthritis who presents now for left knee replacement.  The patient  underwent left total knee replacement February 06, 2006, she tolerated  the procedure well without any complications.  She was started on  Coumadin for DVT prophylaxis,  CPM machine for knee of range of motion.  Hemoglobin was 8.2 on postop day #1.  Incision was intact on postop day  #2.  The patient was seen by physical therapy.  She was transfused one  unit of packed red blood cells on postop day #3 for a hemoglobin of 8.  Her beginning hemoglobin was less than 11.  She had otherwise an  unremarkable recovery.  She was discharged home in good condition on  February 11, 2006.  She will follow up with me in 7 days for stable  removal.   DISCHARGE MEDICATIONS:  Coumadin, INR 2 to 2.5, DVT prophylaxis,  Percocet 1 or 2 p.o. q.3-4 hours p.o. p.r.n. pain, Robaxin 100 mg p.o.  q.6 hours p.r.n. muscle spasm, Detrol LA, Maxzide 37.5/75 daily, calcium  daily, Prevacid 30 daily.      Burnard Bunting, M.D.  Electronically Signed     GSD/MEDQ  D:  05/01/2006  T:  05/02/2006  Job:  [id number]

## 2010-10-07 ENCOUNTER — Other Ambulatory Visit: Payer: Self-pay | Admitting: Gastroenterology

## 2010-10-07 ENCOUNTER — Ambulatory Visit (HOSPITAL_COMMUNITY)
Admission: RE | Admit: 2010-10-07 | Discharge: 2010-10-07 | Disposition: A | Discharge status: Home / Self Care | Payer: Medicare Other | Source: Ambulatory Visit | Attending: Gastroenterology | Admitting: Gastroenterology

## 2010-10-07 DIAGNOSIS — D649 Anemia, unspecified: Secondary | ICD-10-CM | POA: Insufficient documentation

## 2010-10-07 DIAGNOSIS — K648 Other hemorrhoids: Secondary | ICD-10-CM | POA: Insufficient documentation

## 2010-10-07 DIAGNOSIS — K5730 Diverticulosis of large intestine without perforation or abscess without bleeding: Secondary | ICD-10-CM | POA: Insufficient documentation

## 2010-10-07 DIAGNOSIS — Z980 Intestinal bypass and anastomosis status: Secondary | ICD-10-CM | POA: Insufficient documentation

## 2010-10-07 DIAGNOSIS — K5520 Angiodysplasia of colon without hemorrhage: Secondary | ICD-10-CM | POA: Insufficient documentation

## 2010-10-07 DIAGNOSIS — D126 Benign neoplasm of colon, unspecified: Secondary | ICD-10-CM | POA: Insufficient documentation

## 2010-10-07 DIAGNOSIS — K644 Residual hemorrhoidal skin tags: Secondary | ICD-10-CM | POA: Insufficient documentation

## 2010-10-17 ENCOUNTER — Encounter (INDEPENDENT_AMBULATORY_CARE_PROVIDER_SITE_OTHER): Payer: Self-pay | Admitting: General Surgery

## 2010-10-20 ENCOUNTER — Other Ambulatory Visit (HOSPITAL_COMMUNITY): Payer: Self-pay | Admitting: Specialist

## 2010-10-20 DIAGNOSIS — R1310 Dysphagia, unspecified: Secondary | ICD-10-CM

## 2010-10-25 ENCOUNTER — Ambulatory Visit (HOSPITAL_COMMUNITY)
Admission: RE | Admit: 2010-10-25 | Discharge: 2010-10-25 | Disposition: A | Discharge status: Home / Self Care | Payer: Medicare Other | Source: Ambulatory Visit | Attending: Specialist | Admitting: Specialist

## 2010-10-25 DIAGNOSIS — R1310 Dysphagia, unspecified: Secondary | ICD-10-CM

## 2010-11-24 ENCOUNTER — Encounter (HOSPITAL_COMMUNITY)
Admission: RE | Admit: 2010-11-24 | Discharge: 2010-11-24 | Disposition: A | Discharge status: Home / Self Care | Payer: Medicare Other | Source: Ambulatory Visit | Attending: Specialist | Admitting: Specialist

## 2010-11-24 LAB — CBC
HCT: 32.4 % — ABNORMAL LOW (ref 36.0–46.0)
Hemoglobin: 10.6 g/dL — ABNORMAL LOW (ref 12.0–15.0)
MCH: 30.1 pg (ref 26.0–34.0)
MCHC: 32.7 g/dL (ref 30.0–36.0)
MCV: 92 fL (ref 78.0–100.0)
Platelets: 336 10*3/uL (ref 150–400)
RBC: 3.52 MIL/uL — ABNORMAL LOW (ref 3.87–5.11)
RDW: 15.2 % (ref 11.5–15.5)
WBC: 6.1 10*3/uL (ref 4.0–10.5)

## 2010-11-24 LAB — COMPREHENSIVE METABOLIC PANEL
ALT: 10 U/L (ref 0–35)
AST: 16 U/L (ref 0–37)
Albumin: 3.4 g/dL — ABNORMAL LOW (ref 3.5–5.2)
Alkaline Phosphatase: 129 U/L — ABNORMAL HIGH (ref 39–117)
BUN: 9 mg/dL (ref 6–23)
CO2: 23 mEq/L (ref 19–32)
Calcium: 9.3 mg/dL (ref 8.4–10.5)
Chloride: 112 mEq/L (ref 96–112)
Creatinine, Ser: 0.83 mg/dL (ref 0.50–1.10)
GFR calc Af Amer: >60 mL/min (ref >60)
GFR calc non Af Amer: >60 mL/min (ref >60)
Glucose, Bld: 84 mg/dL (ref 70–99)
Potassium: 4.3 mEq/L (ref 3.5–5.1)
Sodium: 144 mEq/L (ref 135–145)
Total Bilirubin: 0.1 mg/dL — ABNORMAL LOW (ref 0.3–1.2)
Total Protein: 6.6 g/dL (ref 6.0–8.3)

## 2010-11-24 LAB — DIFFERENTIAL
Basophils Absolute: 0 10*3/uL (ref 0.0–0.1)
Basophils Relative: 0 % (ref 0–1)
Eosinophils Absolute: 0.2 10*3/uL (ref 0.0–0.7)
Eosinophils Relative: 3 % (ref 0–5)
Lymphocytes Relative: 39 % (ref 12–46)
Lymphs Abs: 2.4 10*3/uL (ref 0.7–4.0)
Monocytes Absolute: 0.7 10*3/uL (ref 0.1–1.0)
Monocytes Relative: 11 % (ref 3–12)
Neutro Abs: 2.8 10*3/uL (ref 1.7–7.7)
Neutrophils Relative %: 46 % (ref 43–77)

## 2010-11-24 LAB — PROTIME-INR
INR: 0.84 (ref 0.00–1.49)
Prothrombin Time: 11.7 seconds (ref 11.6–15.2)

## 2010-11-24 LAB — SURGICAL PCR SCREEN
MRSA, PCR: NEGATIVE
Staphylococcus aureus: NEGATIVE

## 2010-11-29 ENCOUNTER — Ambulatory Visit (HOSPITAL_COMMUNITY): Payer: Medicare Other

## 2010-11-29 ENCOUNTER — Inpatient Hospital Stay (HOSPITAL_COMMUNITY)
Admission: RE | Admit: 2010-11-29 | Discharge: 2010-11-30 | DRG: 030 | Disposition: A | Discharge status: Home / Self Care | Payer: Medicare Other | Source: Ambulatory Visit | Attending: Specialist | Admitting: Specialist

## 2010-11-29 DIAGNOSIS — Z01812 Encounter for preprocedural laboratory examination: Secondary | ICD-10-CM

## 2010-11-29 DIAGNOSIS — Y849 Medical procedure, unspecified as the cause of abnormal reaction of the patient, or of later complication, without mention of misadventure at the time of the procedure: Secondary | ICD-10-CM | POA: Diagnosis present

## 2010-11-29 DIAGNOSIS — J4489 Other specified chronic obstructive pulmonary disease: Secondary | ICD-10-CM | POA: Diagnosis present

## 2010-11-29 DIAGNOSIS — R1310 Dysphagia, unspecified: Secondary | ICD-10-CM | POA: Diagnosis present

## 2010-11-29 DIAGNOSIS — J449 Chronic obstructive pulmonary disease, unspecified: Secondary | ICD-10-CM | POA: Diagnosis present

## 2010-11-29 DIAGNOSIS — T85890A Other specified complication of nervous system prosthetic devices, implants and grafts, initial encounter: Principal | ICD-10-CM | POA: Diagnosis present

## 2010-11-29 DIAGNOSIS — F17200 Nicotine dependence, unspecified, uncomplicated: Secondary | ICD-10-CM | POA: Diagnosis present

## 2010-11-30 NOTE — Op Note (Signed)
NAME:  Sara Fox, Sara Fox NO.:  [id number]  MEDICAL RECORD NO.:  [id number]  LOCATION:  3025                         FACILITY:  [facility]  PHYSICIAN:  Kerrin Champagne, M.D.   DATE OF BIRTH:  March 26, 1941  DATE OF PROCEDURE:  11/29/2010 DATE OF DISCHARGE:                              OPERATIVE REPORT   PREOPERATIVE DIAGNOSES:  Dysphagia status post corpectomy at the C4 level with fusion from C3-C5 anteriorly, plate and graft is prominent at the C4-5 level likely causing impression on the retropharyngeal and the retroesophageal area.  POSTOPERATIVE DIAGNOSES:  Dysphagia status post corpectomy at the C4 level with fusion from C3-C5 anteriorly, plate and graft is prominent at the C4-5 level likely causing impression on the retropharyngeal and the retroesophageal area.  The patient's graft was found to be impinging on the posterior aspect of the esophagus and quite prominent.  Nonunion is present, however, the patient is still within a year following her corpectomy and bone grafting.  This represents a delayed union.  PROCEDURE:  Removal of cervical plate and screws anterior cervical fusion site C3-C5.  Contouring or drilling of the anterior aspect of the strut graft at the C3-C5 level.  Replacement of the plate and screws.  SURGEON:  Kerrin Champagne, MD  ASSISTANT:  Maud Deed, [facility]  ANESTHESIA:  General via orotracheal intubation, Dr. Diamantina Monks, supplemented with local infiltration of Marcaine 0.5% with 1:200,000 epinephrine, a total of 6 mL.  ESTIMATED BLOOD LOSS:  125 mL.  DRAINS:  A TLS drain 7-French anterior neck sewn in place.  BRIEF CLINICAL HISTORY:  This patient is a 70 year old female.  She has undergone previous anterior cervical procedure in February 2012, a corpectomy at the C4 level with decompression of C3-4 and C4-5, strut graft from C4-C5 with anterior plate screw fixation.  This was later followed in 6 weeks with a lumbar decompression and  fusion for multilevel spondylolisthesis and spinal stenosis.  Initial procedure was done for significant cervical stenosis changes over the C3-C5 levels. Tolerated the procedure well.  She had good relief of pain initially and then some mild increasing discomfort.  Over the course of the last 3 months, she has described dysphagia present, underwent a swallow study which demonstrated a dysphagia III diet being necessary with mechanical soft and thin liquids.  Her radiographs have demonstrated prominence of the plate and the graft over the inferior aspect of the construct anteriorly.  This was felt to likely contribute to her persisting dysphagia so that she was scheduled to undergo revision of hardware with drilling of the graft in order to allow for the graft and plate to be more closely deal to her spine.  She understands the risks and benefits of the procedure and signed informed consent.  DESCRIPTION OF PROCEDURE:  The patient was seen in the preoperative holding area, had marking of the left neck and marking of previous incision line within the skin crease in the upper left neck.  The patient received standard preoperative antibiotics of Ancef.  She was taken to the operating room via stretcher OR room #4 at [facility] used for this procedure.  She underwent any induction of  general anesthesia on the OR table with atraumatic intubation.  The patient in a supine position.  The head on the [place] horseshoe well padded with 5 pounds cervical halter traction and slight extension of 5- 10 degrees.  Beach-chair position was used.  Standard prep with the anterior neck using DuraPrep solution.  All pressure points were well- padded of the shoulders bilaterally.  Skids were used, PAS hose.  The patient's prep was carried out using DuraPrep, draped in the usual manner.  Iodine Vi-Drape was used.  The patient had a standard time-out protocol identifying the patient procedure with  expected length procedure and estimated blood loss.  The patient had injection of subcu areas, left neck using Marcaine 0.5%, 1:200,000 epinephrine with total of 6 or 7 mL was used.  Incision with 10 blade scalpel in line with the previous old incision scar on the left side, approximately 3-1/2 to 4 inches in length to the skin and subcu layers through the platysma layer as well.  The deeper subcu layers were then spread using Metzenbaum scissors identifying external jugular vein preserving this.  Continued dissection medially and the space between the carotid sheath and the lateral aspect of the patient's trachea and esophagus.  Carefully soft tissues were spread into this area down to the anterior aspect of the cervical spine where the plate was easily felt.  The dissection was carried distally down to the C5-C6 level and then the longus colli muscle identified along with the lateral aspect of the plate on the left side.  Then using this plane, carefully spreading soft tissues from caudal to cranial on the left side using Metzenbaum scissors, dissector, spreading soft tissue and dissecting it carefully away from the longus colli muscle over the patient's cervical plate.  This dissection was carried superiorly.  Incision was then made onto the prevertebral fascia overlying the left lateral aspect of the plate and then the elevators were used to elevate the scar and prevertebral fascia off the anterior aspect of the plate off of the anterior aspect of the C5 and C5-6 disk space and carefully exposing the anterior aspect of the cervical spine from C5-6 proximal to the C3 level.  Screws were then removed at the C5 level inferiorly and then screws were removed in the C3 level.  Soft tissue attachments over the anterior aspect of the cervical plate was carefully freed using the [facility], also Gilt Edge rongeur was necessary and plates were then easily removed.  One of the screws in  the lower aspect of the C5 level on the left side remained within the plate when the plate was removed and this was without difficulty.  The lower screws did show some looseness.  Upper screws showed good purchase within the C3 vertebral body.  Next soft tissue was carefully d?brided off the anterior aspect of the strut graft extending from C3 down to C5. The prominence of the graft over the superior aspect at C5 was then carefully debrided using Beyer rongeur back superiorly and then with careful retraction soft tissues, a 3-mm cutting bur was then used carefully to remove bone over the anterior aspect of the C4-C5 level, smoothing this to general appearance and its approximation of the upper aspect of the C5 vertebral body.  This appeared to be much smoother in its appearance overall.  However, the contour of the anterior aspect of cervical spine still appeared to be greater than the curve of the plate that was present.  Careful debridement was carried out  to the right side and the left side over the graft placement side in order to remove soft tissue and bone to allow for reapplication of the plate without difficulty.  Intraoperative lateral radiograph of the cervical spine was obtained prior to placement of the plate just to ascertain that thegraft had been carefully drilled anteriorly to allow for the plate to be placed down against the anterior surface of the C5 vertebral body in better position alignment.  When drilling was complete, irrigation was carried out.  The plate then was able to be carefully placed against the anterior aspect of the cervical spine matching up the plate holes superior to the C3 level and inferiorly with the C5 level.  Plate, however, did not show enough lordosis, so it was carefully bent with the bending wires that were available on the [employer]ph[employer] Trestle set.  With this then carefully contoured.  The  plate was then replaced and showed excellent position  alignment.  Initial screw placed was on the left side of the C5 level drilling with a 14-mm drill and then placing revision screw on the left side leaving it slightly loose so that the upper screws could be placed.  On the C3 level, carefully soft tissue superior on the right side and left side were carefully elevated over the plate to ensure the plate set well against the C3 vertebral body.  A 14-mm drill was then used to drill hole on the left-hand side at the C3 level to 14 mm and a revision screw 14 mm was placed on the left side C3 obtaining excellent purchase.  The screw then at the C5 level on the left side was then carefully screwed down to tightness, approximated the plate to the anterior aspect of the cervical spine quite nicely and also obtaining excellent purchase on bone at the C5 level.  Drilling was then performed on the right side of C5.  I performed the revision 14 mm screw placed again obtaining excellent purchase.  Final screw placed on the right side at C3 level, carefully protecting soft tissue structures and Freer elevator, using the sleeve drill providing positive stop 14 mm drill.  Then using revision 14 mm screw at this level on the right obtaining again excellent purchase at this level.  With this then irrigation was performed.  Careful inspection of esophagus demonstrated no abnormalities present.  Intraoperative lateral radiograph demonstrated plates and screws in good position alignment, fixing the anterior aspect of the cervical spine, strut graft in good position alignment, restoration of lordosis present, and the graft and plate well aligned without any sign of significant displacement of the graft or plate inferiorly or suggestion of impingement of soft tissue anterior cervical spine here.  With that then further irrigation was carried out. Small bleeder of the left side soft tissue was controlled using a 6-0 Prolene suture.  This was a small vein and  carefully was examined demonstrating no further bleeding on single suture.  Following irrigation, then a 7-French TLS drain was placed in depth of the incision exiting beneath the incision site anteriorly, sewn in place with 4-0 nylon stitch.  The patient's platysma layer and deep subcu layers approximated with interrupted 3-0 Vicryl sutures, subcutaneous layers with interrupted 3-0 Vicryl sutures, and skin closed with a running subcu stitch of 4-0 Vicryl.  Dermabond was then applied, Mepilex bandage, and the patient then had returned to the recovery room in satisfactory condition.  All instrument and sponge counts were correct. The patient had  soft cervical collar applied in the recovery room. Physician assistant responsibility Maud Deed performed duties of assistant physician during this case.  She was present from the beginning of case till the end of the case, assisted in positioning and traction placement of this patient during the initial phases of the procedure.  She assisted in careful retraction of the patient's soft tissues over the anterior neck and irrigation as well as suctioning of the soft tissues over the anterior cervical spine.  She assisted in removal of the plate and the application of plate.  Again soft tissue retraction was performed.  At the end of case, she closed the incision from the platysma layer to the skin, applied Dermabond dressing and charging the drain, sewing the drain in place.  She assisted then in the transfer of the patient from the OR table to her regular bed.     Kerrin Champagne, M.D.     JEN/MEDQ  D:  11/29/2010  T:  11/30/2010  Job:  [id number]  Electronically Signed by Vira Browns M.D. on 11/30/2010 08:40:11 PM

## 2011-02-13 LAB — CBC
HCT: 27.3 — ABNORMAL LOW
HCT: 34.4 — ABNORMAL LOW
Hemoglobin: 11.4 — ABNORMAL LOW
Hemoglobin: 9.1 — ABNORMAL LOW
MCHC: 33.2
MCHC: 33.4
MCV: 96.5
MCV: 96.6
Platelets: 233
Platelets: 349
RBC: 2.83 — ABNORMAL LOW
RBC: 3.56 — ABNORMAL LOW
RDW: 12.9
RDW: 12.9
WBC: 6.9
WBC: 7.6

## 2011-02-13 LAB — BASIC METABOLIC PANEL
BUN: 11
BUN: 7
CO2: 23
CO2: 25
Calcium: 8 — ABNORMAL LOW
Calcium: 9.4
Chloride: 107
Chloride: 109
Creatinine, Ser: 0.64
Creatinine, Ser: 0.9
GFR calc Af Amer: >60
GFR calc Af Amer: >60
GFR calc non Af Amer: >60
GFR calc non Af Amer: >60
Glucose, Bld: 138 — ABNORMAL HIGH
Glucose, Bld: 98
Potassium: 3.6
Potassium: 5.1
Sodium: 136
Sodium: 140

## 2011-02-13 LAB — PROTIME-INR
INR: 0.9
INR: 1.1
Prothrombin Time: 12.1
Prothrombin Time: 14.3

## 2011-02-13 LAB — URINALYSIS, ROUTINE W REFLEX MICROSCOPIC
Bilirubin Urine: NEGATIVE
Glucose, UA: NEGATIVE
Hgb urine dipstick: NEGATIVE
Ketones, ur: NEGATIVE
Nitrite: NEGATIVE
Protein, ur: NEGATIVE
Specific Gravity, Urine: 1.018
Urobilinogen, UA: 0.2
pH: 6

## 2011-02-13 LAB — URINE CULTURE: Colony Count: >=100000

## 2011-02-14 LAB — PROTIME-INR
INR: 1.3
INR: 1.3
INR: 1.4
Prothrombin Time: 16.3 — ABNORMAL HIGH
Prothrombin Time: 16.7 — ABNORMAL HIGH
Prothrombin Time: 17.4 — ABNORMAL HIGH

## 2011-02-14 LAB — BASIC METABOLIC PANEL
BUN: 6
CO2: 24
Calcium: 8.4
Chloride: 103
Creatinine, Ser: 0.62
GFR calc Af Amer: >60
GFR calc non Af Amer: >60
Glucose, Bld: 125 — ABNORMAL HIGH
Potassium: 3.3 — ABNORMAL LOW
Sodium: 134 — ABNORMAL LOW

## 2011-02-14 LAB — CBC
HCT: 24 — ABNORMAL LOW
HCT: 24.6 — ABNORMAL LOW
Hemoglobin: 8.2 — ABNORMAL LOW
Hemoglobin: 8.3 — ABNORMAL LOW
MCHC: 33.9
MCHC: 34.3
MCV: 95.3
MCV: 96
Platelets: 224
Platelets: 235
RBC: 2.5 — ABNORMAL LOW
RBC: 2.59 — ABNORMAL LOW
RDW: 12.4
RDW: 12.7
WBC: 6.5
WBC: 7.8

## 2011-04-03 ENCOUNTER — Other Ambulatory Visit: Payer: Self-pay | Admitting: Specialist

## 2011-04-03 DIAGNOSIS — M545 Low back pain, unspecified: Secondary | ICD-10-CM

## 2011-04-11 ENCOUNTER — Ambulatory Visit
Admission: RE | Admit: 2011-04-11 | Discharge: 2011-04-11 | Disposition: A | Discharge status: Home / Self Care | Payer: Medicare Other | Source: Ambulatory Visit | Attending: Specialist | Admitting: Specialist

## 2011-04-11 DIAGNOSIS — M545 Low back pain, unspecified: Secondary | ICD-10-CM

## 2011-07-21 ENCOUNTER — Other Ambulatory Visit: Payer: Self-pay | Admitting: Specialist

## 2011-07-21 DIAGNOSIS — M545 Low back pain, unspecified: Secondary | ICD-10-CM

## 2011-07-26 ENCOUNTER — Ambulatory Visit
Admission: RE | Admit: 2011-07-26 | Discharge: 2011-07-26 | Disposition: A | Discharge status: Home / Self Care | Payer: Medicare Other | Source: Ambulatory Visit | Attending: Specialist | Admitting: Specialist

## 2011-07-26 DIAGNOSIS — M545 Low back pain, unspecified: Secondary | ICD-10-CM

## 2013-02-19 ENCOUNTER — Encounter: Payer: Self-pay | Admitting: *Deleted

## 2013-02-20 ENCOUNTER — Ambulatory Visit: Payer: Medicare Other | Admitting: Internal Medicine

## 2013-02-26 ENCOUNTER — Encounter: Payer: Self-pay | Admitting: Internal Medicine

## 2013-02-27 ENCOUNTER — Ambulatory Visit (INDEPENDENT_AMBULATORY_CARE_PROVIDER_SITE_OTHER): Payer: Medicare Other | Admitting: Internal Medicine

## 2013-02-27 ENCOUNTER — Encounter: Payer: Self-pay | Admitting: Internal Medicine

## 2013-02-27 VITALS — BP 130/84 | HR 72 | Ht 65.0 in | Wt 164.3 lb

## 2013-02-27 DIAGNOSIS — R898 Other abnormal findings in specimens from other organs, systems and tissues: Secondary | ICD-10-CM

## 2013-02-27 DIAGNOSIS — Q999 Chromosomal abnormality, unspecified: Secondary | ICD-10-CM

## 2013-02-27 DIAGNOSIS — I10 Essential (primary) hypertension: Secondary | ICD-10-CM

## 2013-02-27 NOTE — Patient Instructions (Signed)
Your physician wants you to follow-up in: 1 year. You will receive a reminder letter in the mail two months in advance. If you don't receive a letter, please call our office to schedule the follow-up appointment.  

## 2013-03-03 ENCOUNTER — Encounter: Payer: Self-pay | Admitting: Internal Medicine

## 2013-03-03 DIAGNOSIS — R898 Other abnormal findings in specimens from other organs, systems and tissues: Secondary | ICD-10-CM | POA: Insufficient documentation

## 2013-03-03 DIAGNOSIS — I10 Essential (primary) hypertension: Secondary | ICD-10-CM | POA: Insufficient documentation

## 2013-03-03 NOTE — Progress Notes (Signed)
OFFICE NOTE  Chief Complaint:  Routine follow-up  Primary Care Physician: Gwynneth Aliment, MD  HPI:  Sara Fox is a 72 year old female with history of multiple surgeries in the past including knee replacement, shoulder surgery, subtotal gastrectomy, and recently she underwent back surgery. She has actually done very well with all these surgeries; has had no cardiac complications, MIs, or any other significant issues. She did have an echocardiogram which showed an EF of 55% recently with mild diastolic dysfunction. There are family risk factors for coronary disease, but she is on appropriate medications including aspirin and a statin. Recently she underwent a Corus gene test in your office which was abnormal, demonstrating a score of 20, which indicates a 30% likelihood of obstructive coronary disease. She remains asymptomatic from a cardiac standpoint and I did not feel additional work-up was necessary.  Her main concern today is caring for her husband who has been pretty sick. She reports her blood pressures been under good control. She is currently taking lovastatin and her cholesterol is at goal.  She does report a trace amount of lower extremity edema from time to time.  PMHx:  Past Medical History  Diagnosis Date  . Hypertension   . Hyperlipidemia   . Stomach problems   . Cervical cancer     Past Surgical History  Procedure Laterality Date  . Knee surgery Bilateral 2001 & 2007  . Shoulder surgery Right   . Neck surgery  2012    2012  . Back surgery  2012    lower back  . Cholecystectomy    . Abdominal hysterectomy    . Partial gastrectomy  2005    subtotal  . Transthoracic echocardiogram  06/02/2010    EF=>55%, normal LV systolic function; normal RV systolic function; mild mitral annular calcif; trace TR; AV mildly sclerotic  . Cardiac catheterization  06/1999    noncritical disease invovling PDA    FAMHx:  Family History  Problem Relation Age of Onset  . Heart  disease Mother     also HTN  . Diabetes Mother   . Colon cancer Mother   . Heart disease Brother     deceased at 67  . Hypertension Brother   . Heart attack Brother   . Hypertension Brother   . Heart disease Brother     SOCHx:   reports that she has been smoking.  She has never used smokeless tobacco. She reports that she does not drink alcohol or use illicit drugs.  ALLERGIES:  Allergies  Allergen Reactions  . Celebrex [Celecoxib]   . Codeine   . Morphine And Related   . Vioxx [Rofecoxib]     ROS: A comprehensive review of systems was negative except for: Cardiovascular: positive for lower extremity edema  HOME MEDS: Current Outpatient Prescriptions  Medication Sig Dispense Refill  . alendronate (FOSAMAX) 70 MG tablet Take 70 mg by mouth every 7 (seven) days. Take with a full glass of water on an empty stomach.      Marland Kitchen aspirin 325 MG tablet Take 325 mg by mouth daily.        . budesonide-formoterol (SYMBICORT) 160-4.5 MCG/ACT inhaler Inhale 2 puffs into the lungs 2 (two) times daily.      Marland Kitchen CALCIUM CITRATE PO Take by mouth daily.      . Cholecalciferol (VITAMIN D3) 2000 UNITS capsule Take 2,000 Units by mouth daily.      . furosemide (LASIX) 20 MG tablet Take 20 mg by  mouth daily.      Marland Kitchen HYDROcodone-acetaminophen (NORCO) 7.5-325 MG per tablet Take 1 tablet by mouth every 6 (six) hours as needed for pain.      Marland Kitchen ibuprofen (ADVIL,MOTRIN) 200 MG tablet Take 200 mg by mouth every 6 (six) hours as needed for pain.      Marland Kitchen lovastatin (MEVACOR) 20 MG tablet Take 20 mg by mouth at bedtime.      . Multiple Vitamin (MULTIVITAMIN) capsule Take 1 capsule by mouth daily. Contains iron      . naproxen sodium (ANAPROX) 220 MG tablet Take 220 mg by mouth 2 (two) times daily with a meal.      . solifenacin (VESICARE) 10 MG tablet Take 10 mg by mouth daily.      . tizanidine (ZANAFLEX) 2 MG capsule Take 2 mg by mouth 2 (two) times daily.       . vitamin C (ASCORBIC ACID) 500 MG tablet Take  500 mg by mouth daily.       No current facility-administered medications for this visit.    LABS/IMAGING: No results found for this or any previous visit (from the past 48 hour(s)). No results found.  VITALS: BP 130/84  Pulse 72  Ht 5\' 5"  (1.651 m)  Wt 164 lb 4.8 oz (74.526 kg)  BMI 27.34 kg/m2  EXAM: General appearance: alert and no distress Neck: no JVD Lungs: clear to auscultation bilaterally Heart: regular rate and rhythm, S1, S2 normal and systolic murmur: early systolic 2/6, crescendo at 2nd right intercostal space Abdomen: soft, non-tender; bowel sounds normal; no masses,  no organomegaly Extremities: edema trace bilateral Pulses: 2+ and symmetric Skin: Skin color, texture, turgor normal. No rashes or lesions Neurologic: Grossly normal Psych: Seems somewhat pre-occupied  EKG: Normal sinus rhythm at 72  ASSESSMENT: 1. Hypertension-controlled 2. Dyslipidemia 3. Mildly abnormal Corus gene-test - low risk for obstructive CAD  PLAN: 1.   Overall [name] is doing fairly well, but is struggling to take care of her husband at home. Her blood pressure is well controlled and her cholesterol is at goal her current medications. She is being treated aggressively for risk factor modification and continues to be asymptomatic from a cardiac standpoint.  I plan to see her back annually or sooner as necessary.  Chrystie Nose, MD, [facility] Attending Cardiologist [facility]  Mecca Barga C 03/03/2013, 1:08 PM

## 2013-05-27 ENCOUNTER — Encounter: Payer: Self-pay | Admitting: Physical Medicine & Rehabilitation

## 2013-06-26 ENCOUNTER — Emergency Department (HOSPITAL_COMMUNITY)
Admission: EM | Admit: 2013-06-26 | Discharge: 2013-06-26 | Disposition: A | Discharge status: Home / Self Care | Payer: Medicare PPO

## 2013-06-26 ENCOUNTER — Ambulatory Visit (HOSPITAL_COMMUNITY)
Admission: RE | Admit: 2013-06-26 | Discharge: 2013-06-26 | Disposition: A | Discharge status: Home / Self Care | Payer: Medicare PPO | Source: Ambulatory Visit | Attending: Physical Medicine & Rehabilitation | Admitting: Physical Medicine & Rehabilitation

## 2013-06-26 ENCOUNTER — Telehealth: Payer: Self-pay

## 2013-06-26 ENCOUNTER — Ambulatory Visit (HOSPITAL_BASED_OUTPATIENT_CLINIC_OR_DEPARTMENT_OTHER): Payer: Medicare PPO | Admitting: Physical Medicine & Rehabilitation

## 2013-06-26 ENCOUNTER — Encounter (HOSPITAL_COMMUNITY): Payer: Self-pay | Admitting: Emergency Medicine

## 2013-06-26 ENCOUNTER — Encounter: Payer: Medicare HMO | Attending: Physical Medicine & Rehabilitation

## 2013-06-26 ENCOUNTER — Encounter: Payer: Self-pay | Admitting: Physical Medicine & Rehabilitation

## 2013-06-26 ENCOUNTER — Encounter (INDEPENDENT_AMBULATORY_CARE_PROVIDER_SITE_OTHER): Payer: Self-pay

## 2013-06-26 VITALS — BP 143/78 | HR 86 | Resp 14 | Ht 65.0 in | Wt 166.0 lb

## 2013-06-26 DIAGNOSIS — M542 Cervicalgia: Secondary | ICD-10-CM

## 2013-06-26 DIAGNOSIS — G8929 Other chronic pain: Secondary | ICD-10-CM | POA: Insufficient documentation

## 2013-06-26 DIAGNOSIS — E785 Hyperlipidemia, unspecified: Secondary | ICD-10-CM | POA: Insufficient documentation

## 2013-06-26 DIAGNOSIS — Z981 Arthrodesis status: Secondary | ICD-10-CM | POA: Insufficient documentation

## 2013-06-26 DIAGNOSIS — Z96659 Presence of unspecified artificial knee joint: Secondary | ICD-10-CM | POA: Insufficient documentation

## 2013-06-26 DIAGNOSIS — M4310 Spondylolisthesis, site unspecified: Secondary | ICD-10-CM | POA: Insufficient documentation

## 2013-06-26 DIAGNOSIS — M545 Low back pain, unspecified: Secondary | ICD-10-CM | POA: Insufficient documentation

## 2013-06-26 DIAGNOSIS — F17200 Nicotine dependence, unspecified, uncomplicated: Secondary | ICD-10-CM | POA: Insufficient documentation

## 2013-06-26 DIAGNOSIS — M961 Postlaminectomy syndrome, not elsewhere classified: Secondary | ICD-10-CM

## 2013-06-26 DIAGNOSIS — M549 Dorsalgia, unspecified: Secondary | ICD-10-CM

## 2013-06-26 DIAGNOSIS — I10 Essential (primary) hypertension: Secondary | ICD-10-CM | POA: Insufficient documentation

## 2013-06-26 DIAGNOSIS — Z79899 Other long term (current) drug therapy: Secondary | ICD-10-CM

## 2013-06-26 DIAGNOSIS — Z5181 Encounter for therapeutic drug level monitoring: Secondary | ICD-10-CM

## 2013-06-26 DIAGNOSIS — Z96619 Presence of unspecified artificial shoulder joint: Secondary | ICD-10-CM | POA: Insufficient documentation

## 2013-06-26 MED ORDER — TRAMADOL HCL 50 MG PO TABS: 50.0000 mg | ORAL_TABLET | Freq: Three times a day (TID) | ORAL | Status: DC | PRN

## 2013-06-26 NOTE — Patient Instructions (Signed)
Physical therapy will call you for appointment  X-rays of the neck at [facility]  Next visit is for lumbar injection

## 2013-06-26 NOTE — Telephone Encounter (Signed)
Someone from the ER called regarding patient being there for xrays.  Advised ER to have patient go to radiology and xray orders would be available.

## 2013-06-26 NOTE — ED Notes (Signed)
Pt stated that she was seen by Dr Darrick Meigs today. Stated that she needed xrays of her back due to chronic pain

## 2013-06-26 NOTE — Progress Notes (Signed)
Subjective:    Patient ID: Sara Fox, female    DOB: Jun 01, 1940, 73 y.o.   MRN: [id number]  HPI Chief complaint: Low back pain, chronic, Status post lumbar fusion instrumented,L4-5,L5-S1 2012  Secondary complaint neck pain, ACDF 2012 C3-C5 recurrence of pain starting 6 months ago  Patient was able to resume bowling after her cervical and lumbar fusion surgeries.  Had been on oxycodone post operatively , 2012 and 2013 and then switched to hydrocodone  2014  Used to have physical therapy coming to the house. Has not had outpatient therapy. Patient still drives and gets out of the house. She does have to help her husband who is very ill  Never has tried tramadol or Ultram Has been taking Aleve and ibuprofen when hydrocodone has run out.has been taking large amounts of 20 tablets per day  PSH:  Right shoulder replacement 2011, pain improved but not motion, right total knee replacement 2001 and 2007. Left total knee replacement 2007 Pain Inventory Average Pain 7 Pain Right Now 9 My pain is sharp, aching and cramping  In the last 24 hours, has pain interfered with the following? General activity 6 Relation with others 2 Enjoyment of life 6 What TIME of day is your pain at its worst? morning, night Sleep (in general) Fair  Pain is worse with: walking, sitting, standing and some activites Pain improves with: medication and TENS Relief from Meds: 8  Mobility walk without assistance ability to climb steps?  no do you drive?  yes transfers alone Do you have any goals in this area?  no  Function disabled: date disabled 07/1999 retired  Neuro/Psych bladder control problems numbness trouble walking spasms  Prior Studies Any changes since last visit?  no 07/26/11/  CT lumbar IMPRESSION:  Worsening of the appearance at L5-S1. Definite nonunion.  Pronounced screw loosening in the sacrum. Nitrogen gas around the  interbody fusion material which is partially extruded in  the right  posterolateral direction. Anterolisthesis is worsened now  measuring 7 mm.  I think fusion is solid at L4-5. Anterolisthesis measures 8 mm.  There is foraminal encroachment on the left by osteophyte and disc  material.  L3-4 shows moderate multifactorial stenosis due to circumferential  protrusion of disc material in combination with facet and  ligamentous prominence.  11/28/2012 OPERATIVE CERVICAL SPINE - 2-3 VIEW 11/29/2010:  Comparison: Two-view cervical spine x-rays 08/01/2010 and  06/17/2010.  Findings: Images were submitted for interpretation post-  operatively. Initial image at 1610 hours demonstrates removal of  the plate and screws from C3-C5. Second image obtained at 1640  hours demonstrates a new anterior cervical plate extending from C3-  C5, with screws at C3 and C5. Alignment appears anatomic.  Prevertebral soft tissues normal. No acute complicating features.  Physicians involved in your care Any changes since last visit?  no   Family History  Problem Relation Age of Onset  . Heart disease Mother     also HTN  . Diabetes Mother   . Colon cancer Mother   . Heart disease Brother     deceased at 31  . Hypertension Brother   . Heart attack Brother   . Hypertension Brother   . Heart disease Brother    History   Social History  . Marital Status: Married    Spouse Name: N/A    Number of Children: 2  . Years of Education: GED   Occupational History  .     Social History Main Topics  .  Smoking status: Current Every Day Smoker -- 1.00 packs/day for 60 years  . Smokeless tobacco: Never Used     Comment: currently smokes 1/2ppd or less  . Alcohol Use: No  . Drug Use: No  . Sexual Activity: None   Other Topics Concern  . None   Social History Narrative  . None   Past Surgical History  Procedure Laterality Date  . Knee surgery Bilateral 2001 & 2007  . Shoulder surgery Right   . Neck surgery  2012    2012  . Back surgery  2012    lower  back  . Cholecystectomy    . Abdominal hysterectomy    . Partial gastrectomy  2005    subtotal  . Transthoracic echocardiogram  06/02/2010    EF=>55%, normal LV systolic function; normal RV systolic function; mild mitral annular calcif; trace TR; AV mildly sclerotic  . Cardiac catheterization  06/1999    noncritical disease invovling PDA   Past Medical History  Diagnosis Date  . Hypertension   . Hyperlipidemia   . Stomach problems   . Cervical cancer   . Back pain   . Neck pain    BP 143/78  Pulse 86  Resp 14  Ht 5\' 5"  (1.651 m)  Wt 166 lb (75.297 kg)  BMI 27.62 kg/m2  SpO2 98%  Opioid Risk Score: 0 Fall Risk Score: High Fall Risk (>13 points) (pt educated on fall risk, brochure given to pt.)   Review of Systems  Cardiovascular: Positive for leg swelling.  Genitourinary:       Bladder control problems  Musculoskeletal: Positive for back pain, gait problem and neck pain.  Neurological: Positive for numbness.       Spasms  Hematological: Bruises/bleeds easily.  All other systems reviewed and are negative.       Objective:   Physical Exam  Nursing note and vitals reviewed. Constitutional: She appears well-developed and well-nourished.  HENT:  Head: Normocephalic and atraumatic.  Eyes: Conjunctivae are normal. Pupils are equal, round, and reactive to light.  Musculoskeletal:       Right ankle: She exhibits swelling. She exhibits normal range of motion.       Left ankle: She exhibits swelling. She exhibits normal range of motion.       Cervical back: She exhibits decreased range of motion.  Pain with cervical range of motion has 50% range lateral rotation as well as flexion and extension. Pain is more on the left side along the trapezius area.  Shoulder abd to 90 degrees  Full range of motion at the wrist and elbows. Mild reduction in flexion-extension at the PIP's  Hamstring contracture right side  Mediolateral instability bilateral knees            Assessment & Plan:  1.  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 May benefit from facet injection vs. Sacroiliac injection At next visit, start PT in meantime  Trial of Tramadol   2. Cervical postlaminectomy syndrome with recurrence of pain about 6 months ago. Advise followup with orthopedics. Can do flexion extension views in the meantime  3.  Bilateral Knee and Right shoulder replacement with reduced ROM and stability  Trigger Point Injection  Indication: Left trap Myofascial pain not relieved by medication management and other conservative care.  Informed consent was obtained after describing risk and benefits of the procedure with the patient, this includes bleeding, bruising, infection and medication side effects.  The patient wishes to proceed and has given  written consent.  The patient was placed in a seated position.  The Left trap area was marked and prepped with Betadine.  It was entered with a 25-gauge 1-1/2 inch needle and 1 mL of 1% lidocaine was injected into each of 1 trigger points, after negative draw back for blood.  The patient tolerated the procedure well.  Post procedure instructions were given.

## 2013-07-08 ENCOUNTER — Ambulatory Visit: Payer: Medicare Other | Admitting: Physical Therapy

## 2013-07-15 ENCOUNTER — Ambulatory Visit: Payer: Medicare HMO | Attending: Internal Medicine

## 2013-07-15 DIAGNOSIS — M25559 Pain in unspecified hip: Secondary | ICD-10-CM | POA: Insufficient documentation

## 2013-07-15 DIAGNOSIS — M545 Low back pain, unspecified: Secondary | ICD-10-CM | POA: Insufficient documentation

## 2013-07-15 DIAGNOSIS — IMO0001 Reserved for inherently not codable concepts without codable children: Secondary | ICD-10-CM | POA: Insufficient documentation

## 2013-07-15 DIAGNOSIS — M2560 Stiffness of unspecified joint, not elsewhere classified: Secondary | ICD-10-CM | POA: Insufficient documentation

## 2013-07-17 ENCOUNTER — Ambulatory Visit: Payer: Medicare HMO

## 2013-07-22 ENCOUNTER — Ambulatory Visit: Payer: Medicare HMO | Admitting: Rehabilitation

## 2013-07-24 ENCOUNTER — Ambulatory Visit: Payer: Medicare HMO | Admitting: Rehabilitation

## 2013-07-29 ENCOUNTER — Ambulatory Visit: Payer: Medicare HMO | Admitting: Rehabilitation

## 2013-07-31 ENCOUNTER — Ambulatory Visit: Payer: Medicare HMO | Admitting: Rehabilitation

## 2013-07-31 ENCOUNTER — Ambulatory Visit (HOSPITAL_BASED_OUTPATIENT_CLINIC_OR_DEPARTMENT_OTHER): Payer: Commercial Managed Care - HMO | Admitting: Physical Medicine & Rehabilitation

## 2013-07-31 ENCOUNTER — Encounter: Payer: Self-pay | Admitting: Physical Medicine & Rehabilitation

## 2013-07-31 ENCOUNTER — Encounter: Payer: Medicare HMO | Attending: Physical Medicine & Rehabilitation

## 2013-07-31 VITALS — BP 150/79 | HR 86 | Resp 14 | Ht 65.0 in | Wt 176.0 lb

## 2013-07-31 DIAGNOSIS — Z96619 Presence of unspecified artificial shoulder joint: Secondary | ICD-10-CM | POA: Insufficient documentation

## 2013-07-31 DIAGNOSIS — F17200 Nicotine dependence, unspecified, uncomplicated: Secondary | ICD-10-CM | POA: Insufficient documentation

## 2013-07-31 DIAGNOSIS — Z96659 Presence of unspecified artificial knee joint: Secondary | ICD-10-CM | POA: Insufficient documentation

## 2013-07-31 DIAGNOSIS — M24519 Contracture, unspecified shoulder: Secondary | ICD-10-CM

## 2013-07-31 DIAGNOSIS — G8929 Other chronic pain: Secondary | ICD-10-CM | POA: Insufficient documentation

## 2013-07-31 DIAGNOSIS — M961 Postlaminectomy syndrome, not elsewhere classified: Secondary | ICD-10-CM | POA: Insufficient documentation

## 2013-07-31 DIAGNOSIS — M545 Low back pain, unspecified: Secondary | ICD-10-CM

## 2013-07-31 DIAGNOSIS — I10 Essential (primary) hypertension: Secondary | ICD-10-CM | POA: Insufficient documentation

## 2013-07-31 DIAGNOSIS — E785 Hyperlipidemia, unspecified: Secondary | ICD-10-CM | POA: Insufficient documentation

## 2013-07-31 DIAGNOSIS — Z981 Arthrodesis status: Secondary | ICD-10-CM | POA: Insufficient documentation

## 2013-07-31 MED ORDER — TRAMADOL HCL 50 MG PO TABS: 50.0000 mg | ORAL_TABLET | Freq: Three times a day (TID) | ORAL | Status: DC | PRN

## 2013-07-31 NOTE — Progress Notes (Signed)
Subjective:    Patient ID: Sara Fox, female    DOB: 1940/12/31, 73 y.o.   MRN: [id number]  HPI Chief complaint: Low back pain, chronic, Status post lumbar fusion instrumented,L4-5,L5-S1 2012  Secondary complaint neck pain, ACDF 2012 C3-C5 recurrence of pain starting 6 months ago  Patient was able to resume bowling after her cervical and lumbar fusion surgeries.  Had been on oxycodone post operatively , 2012 and 2013 and then switched to hydrocodone 2014  Used to have physical therapy coming to the house. Has not had outpatient therapy. Patient still drives and gets out of the house. She does have to help her husband who is very ill but doing a little better Never has tried tramadol or Ultram   PSH: Right shoulder replacement 2011, pain improved but not motion, right total knee replacement 2001 and 2007. Left total knee replacement 2007  Trigger point injection last visit helpful for neck spasms  PT twice a week helpful, tried TENS unit which was beneficial, have written prescription for this today  Scheduled for Sacroiliac injection today but no driver  Tramadol 50mg  TID helpful , no SEs Pain Inventory Average Pain 7 Pain Right Now 5 My pain is dull and tingling  In the last 24 hours, has pain interfered with the following? General activity 4 Relation with others 4 Enjoyment of life 5 What TIME of day is your pain at its worst? evening Sleep (in general) Fair  Pain is worse with: walking, inactivity and standing Pain improves with: rest, heat/ice, therapy/exercise and medication Relief from Meds: 6  Mobility walk without assistance how many minutes can you walk? 10 ability to climb steps?  yes do you drive?  yes Do you have any goals in this area?  yes  Function retired  Neuro/Psych bladder control problems numbness trouble walking spasms  Prior Studies Any changes since last visit?  no  Physicians involved in your care Any changes since last visit?   no   Family History  Problem Relation Age of Onset  . Heart disease Mother     also HTN  . Diabetes Mother   . Colon cancer Mother   . Heart disease Brother     deceased at 14  . Hypertension Brother   . Heart attack Brother   . Hypertension Brother   . Heart disease Brother    History   Social History  . Marital Status: Married    Spouse Name: N/A    Number of Children: 2  . Years of Education: GED   Occupational History  .     Social History Main Topics  . Smoking status: Current Every Day Smoker -- 1.00 packs/day for 60 years  . Smokeless tobacco: Never Used     Comment: currently smokes 1/2ppd or less  . Alcohol Use: No  . Drug Use: No  . Sexual Activity: None   Other Topics Concern  . None   Social History Narrative  . None   Past Surgical History  Procedure Laterality Date  . Knee surgery Bilateral 2001 & 2007  . Shoulder surgery Right   . Neck surgery  2012    2012  . Back surgery  2012    lower back  . Cholecystectomy    . Abdominal hysterectomy    . Partial gastrectomy  2005    subtotal  . Transthoracic echocardiogram  06/02/2010    EF=>55%, normal LV systolic function; normal RV systolic function; mild mitral annular calcif; trace  TR; AV mildly sclerotic  . Cardiac catheterization  06/1999    noncritical disease invovling PDA   Past Medical History  Diagnosis Date  . Hypertension   . Hyperlipidemia   . Stomach problems   . Cervical cancer   . Back pain   . Neck pain    BP 150/79  Pulse 86  Resp 14  Ht 5\' 5"  (1.651 m)  Wt 176 lb (79.833 kg)  BMI 29.29 kg/m2  SpO2 97%  Opioid Risk Score:   Fall Risk Score: High Fall Risk (>13 points) (patient educated handout given)   Review of Systems  Genitourinary: Positive for difficulty urinating.  Musculoskeletal: Positive for back pain and gait problem.  Neurological: Positive for numbness.  All other systems reviewed and are negative.       Objective:   Physical Exam  Nursing  note and vitals reviewed.  Constitutional: She appears well-developed and well-nourished.  HENT:  Head: Normocephalic and atraumatic.   Musculoskeletal:  Right ankle: She exhibits swelling. She exhibits normal range of motion.  Left ankle: She exhibits swelling. She exhibits normal range of motion.  Cervical back: She exhibits decreased range of motion.  Pain with cervical range of motion has 50% range lateral rotation as well as flexion and extension. Pain is more on the left side along the trapezius area.  Shoulder abd to 90 degrees  Full range of motion at the wrist and elbows. Mild reduction in flexion-extension at the PIP's  Hamstring contracture right side  Mediolateral instability bilateral knees   Reduced spine ROM, all directions     Assessment & Plan:  1. 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 May benefit from facet injection vs. Sacroiliac injection needs to reschedule due to no driver Cont PT Cont Tramadol 50mg  TID 2. Cervical postlaminectomy syndrome with recurrence of pain about 6 months ago. Advise followup with orthopedics. Mild anterolisthesis C5-6,C6-7 3. Bilateral Knee and Right shoulder replacement with reduced ROM and stability

## 2013-08-01 DIAGNOSIS — M961 Postlaminectomy syndrome, not elsewhere classified: Secondary | ICD-10-CM | POA: Insufficient documentation

## 2013-08-01 DIAGNOSIS — G8929 Other chronic pain: Secondary | ICD-10-CM | POA: Insufficient documentation

## 2013-08-01 DIAGNOSIS — M24519 Contracture, unspecified shoulder: Secondary | ICD-10-CM | POA: Insufficient documentation

## 2013-08-01 DIAGNOSIS — M5442 Lumbago with sciatica, left side: Secondary | ICD-10-CM

## 2013-08-05 ENCOUNTER — Ambulatory Visit: Payer: Medicare HMO | Admitting: Rehabilitation

## 2013-08-07 ENCOUNTER — Ambulatory Visit: Payer: Medicare HMO | Admitting: Rehabilitation

## 2013-08-12 ENCOUNTER — Ambulatory Visit: Payer: Medicare HMO | Admitting: Rehabilitation

## 2013-08-14 ENCOUNTER — Encounter: Payer: Medicare HMO | Admitting: Rehabilitation

## 2013-09-04 ENCOUNTER — Encounter: Payer: Medicare HMO | Attending: Physical Medicine & Rehabilitation

## 2013-09-04 ENCOUNTER — Encounter: Payer: Self-pay | Admitting: Physical Medicine & Rehabilitation

## 2013-09-04 ENCOUNTER — Ambulatory Visit (HOSPITAL_BASED_OUTPATIENT_CLINIC_OR_DEPARTMENT_OTHER): Payer: Medicare HMO | Admitting: Physical Medicine & Rehabilitation

## 2013-09-04 VITALS — BP 152/73 | HR 80 | Resp 14 | Ht 65.0 in | Wt 176.0 lb

## 2013-09-04 DIAGNOSIS — Z96659 Presence of unspecified artificial knee joint: Secondary | ICD-10-CM | POA: Insufficient documentation

## 2013-09-04 DIAGNOSIS — E785 Hyperlipidemia, unspecified: Secondary | ICD-10-CM | POA: Insufficient documentation

## 2013-09-04 DIAGNOSIS — Z981 Arthrodesis status: Secondary | ICD-10-CM | POA: Insufficient documentation

## 2013-09-04 DIAGNOSIS — M545 Low back pain, unspecified: Secondary | ICD-10-CM | POA: Insufficient documentation

## 2013-09-04 DIAGNOSIS — M533 Sacrococcygeal disorders, not elsewhere classified: Secondary | ICD-10-CM

## 2013-09-04 DIAGNOSIS — G8929 Other chronic pain: Secondary | ICD-10-CM | POA: Insufficient documentation

## 2013-09-04 DIAGNOSIS — F17200 Nicotine dependence, unspecified, uncomplicated: Secondary | ICD-10-CM | POA: Insufficient documentation

## 2013-09-04 DIAGNOSIS — Z96619 Presence of unspecified artificial shoulder joint: Secondary | ICD-10-CM | POA: Insufficient documentation

## 2013-09-04 DIAGNOSIS — I10 Essential (primary) hypertension: Secondary | ICD-10-CM | POA: Insufficient documentation

## 2013-09-04 DIAGNOSIS — M961 Postlaminectomy syndrome, not elsewhere classified: Secondary | ICD-10-CM | POA: Insufficient documentation

## 2013-09-04 NOTE — Progress Notes (Signed)
  PROCEDURE RECORD [facility]   Name: Sara Fox DOB:1941/05/02 MRN: [id number]  Date:09/04/2013  Physician: Alysia Penna, MD    Nurse/CMA: Levens,CMA(AAMA)/Shumaker RN  Allergies:  Allergies  Allergen Reactions  . Celebrex [Celecoxib]   . Codeine   . Morphine And Related   . Vioxx [Rofecoxib]     Consent Signed: yes  Is patient diabetic? no   Pregnant: no LMP: No LMP recorded. Patient has had a hysterectomy. (age 21-55)  Anticoagulants: no Anti-inflammatory: no Antibiotics: no  Procedure: Bilateral Sacroiliac Injection  Position: Prone Start Time: 10:19  End Time: 10:24  Fluoro Time: 15 seconds  RN/CMA Levens,CMA Shumaker, RN    Time 1005 10:28    BP 152/73 174/75    Pulse 80 75    Respirations 14 14    O2 Sat 98 99    S/S 6 6    Pain Level 8/10 5/10     D/C home with Joseph-son, [name] & O X 3, D/C instructions reviewed, and sits independently.

## 2013-09-04 NOTE — Progress Notes (Signed)
Bilateral sacroiliac injections under fluoroscopic guidance  Indication: Low back and buttocks pain not relieved by medication management and other conservative care.  Informed consent was obtained after describing risks and benefits of the procedure with the patient, this includes bleeding, bruising, infection, paralysis and medication side effects. The patient wishes to proceed and has given written consent. The patient was placed in a prone position. The lumbar and sacral area was marked and prepped with Betadine. A 25-gauge 1-1/2 inch needle was inserted into the skin and subcutaneous tissue and 1 mL of 1% lidocaine was injected into each side. Then a 25-gauge 3 inch spinal needle was inserted under fluoroscopic guidance into the left sacroiliac joint. AP and lateral images were utilized. Omnipaque 180x0.5 mL under live fluoroscopy demonstrated no intravascular uptake. Then a solution containing one ML of 40 mg per mL Depakote met drawl in 2 ML of 2% lidocaine MPF was injected x1.5 mL. This same procedure was repeated on the right side using the same needle, injectate, and technique. Patient tolerated the procedure well. Post procedure instructions were given. Please see post procedure form.

## 2013-09-04 NOTE — Patient Instructions (Signed)

## 2013-10-10 ENCOUNTER — Encounter: Payer: Medicare HMO | Attending: Physical Medicine & Rehabilitation

## 2013-10-10 ENCOUNTER — Ambulatory Visit (HOSPITAL_BASED_OUTPATIENT_CLINIC_OR_DEPARTMENT_OTHER): Payer: Commercial Managed Care - HMO | Admitting: Physical Medicine & Rehabilitation

## 2013-10-10 ENCOUNTER — Encounter: Payer: Self-pay | Admitting: Physical Medicine & Rehabilitation

## 2013-10-10 VITALS — BP 159/82 | HR 92 | Resp 14 | Ht 65.0 in | Wt 170.0 lb

## 2013-10-10 DIAGNOSIS — M533 Sacrococcygeal disorders, not elsewhere classified: Secondary | ICD-10-CM

## 2013-10-10 DIAGNOSIS — Z96619 Presence of unspecified artificial shoulder joint: Secondary | ICD-10-CM | POA: Insufficient documentation

## 2013-10-10 DIAGNOSIS — G8929 Other chronic pain: Secondary | ICD-10-CM | POA: Insufficient documentation

## 2013-10-10 DIAGNOSIS — M545 Low back pain, unspecified: Secondary | ICD-10-CM | POA: Insufficient documentation

## 2013-10-10 DIAGNOSIS — I10 Essential (primary) hypertension: Secondary | ICD-10-CM | POA: Insufficient documentation

## 2013-10-10 DIAGNOSIS — Z96659 Presence of unspecified artificial knee joint: Secondary | ICD-10-CM | POA: Insufficient documentation

## 2013-10-10 DIAGNOSIS — E785 Hyperlipidemia, unspecified: Secondary | ICD-10-CM | POA: Insufficient documentation

## 2013-10-10 DIAGNOSIS — F17200 Nicotine dependence, unspecified, uncomplicated: Secondary | ICD-10-CM | POA: Insufficient documentation

## 2013-10-10 DIAGNOSIS — Z981 Arthrodesis status: Secondary | ICD-10-CM | POA: Insufficient documentation

## 2013-10-10 DIAGNOSIS — M961 Postlaminectomy syndrome, not elsewhere classified: Secondary | ICD-10-CM | POA: Insufficient documentation

## 2013-10-10 MED ORDER — TRAMADOL HCL 50 MG PO TABS: 50.0000 mg | ORAL_TABLET | Freq: Four times a day (QID) | ORAL | Status: DC

## 2013-10-10 NOTE — Patient Instructions (Signed)
Repeat sacroiliac injection next visit

## 2013-10-10 NOTE — Progress Notes (Signed)
Subjective:    Patient ID: Sara Fox, female    DOB: 07/25/1940, 73 y.o.   MRN: [id number]  HPI Sep 04, 2013 12:44 PM EDT  09/04/2013   Bilateral sacroiliac injections under fluoroscopic guidance   Injections were very helpful for about 2 weeks. Now they have worn off and the patient needs to take for ibuprofen and 1 tramadol in the morning to help with back pain  Neck pain is doing okay. Not needing to take medicine for this. Occasional cramp in the neck.  Has swelling in the legs but not shooting pains, legs or the arms Pain Inventory Average Pain 7 Pain Right Now 5 My pain is dull and tingling  In the last 24 hours, has pain interfered with the following? General activity 4 Relation with others 4 Enjoyment of life 5 What TIME of day is your pain at its worst? evening Sleep (in general) Fair  Pain is worse with: walking and standing Pain improves with: medication, TENS and injections Relief from Meds: 7  Mobility walk without assistance how many minutes can you walk? 20 ability to climb steps?  yes do you drive?  yes transfers alone Do you have any goals in this area?  yes  Function not employed: date last employed . Do you have any goals in this area?  yes  Neuro/Psych bladder control problems numbness trouble walking  Prior Studies Any changes since last visit?  no  Physicians involved in your care Primary care .   Family History  Problem Relation Age of Onset  . Heart disease Mother     also HTN  . Diabetes Mother   . Colon cancer Mother   . Heart disease Brother     deceased at 33  . Hypertension Brother   . Heart attack Brother   . Hypertension Brother   . Heart disease Brother    History   Social History  . Marital Status: Married    Spouse Name: N/A    Number of Children: 2  . Years of Education: GED   Occupational History  .     Social History Main Topics  . Smoking status: Current Every Day Smoker -- 1.00 packs/day for  60 years  . Smokeless tobacco: Never Used     Comment: currently smokes 1/2ppd or less  . Alcohol Use: No  . Drug Use: No  . Sexual Activity: None   Other Topics Concern  . None   Social History Narrative  . None   Past Surgical History  Procedure Laterality Date  . Knee surgery Bilateral 2001 & 2007  . Shoulder surgery Right   . Neck surgery  2012    2012  . Back surgery  2012    lower back  . Cholecystectomy    . Abdominal hysterectomy    . Partial gastrectomy  2005    subtotal  . Transthoracic echocardiogram  06/02/2010    EF=>55%, normal LV systolic function; normal RV systolic function; mild mitral annular calcif; trace TR; AV mildly sclerotic  . Cardiac catheterization  06/1999    noncritical disease invovling PDA   Past Medical History  Diagnosis Date  . Hypertension   . Hyperlipidemia   . Stomach problems   . Cervical cancer   . Back pain   . Neck pain    BP 159/82  Pulse 92  Resp 14  Ht 5\' 5"  (1.651 m)  Wt 170 lb (77.111 kg)  BMI 28.29 kg/m2  SpO2  97%  Opioid Risk Score:   Fall Risk Score: High Fall Risk (>13 points) (patient educated handout declined)   Review of Systems  Constitutional: Positive for diaphoresis.  Genitourinary: Positive for difficulty urinating.  Musculoskeletal: Positive for gait problem.  Neurological: Positive for numbness.  All other systems reviewed and are negative.      Objective:   Physical Exam  Tenderness over bilateral P SIS Pain with right lateral bending but not with left lateral bending. Negative straight leg raising Mood and affect are appropriate General no acute distress      Assessment & Plan:  1. Bilateral sacroiliac pain after lumbar fusion. Had good but temporary relief after bilateral sacroiliac injection under fluoroscopic guidance. Would recommend repeat injection next visit.  Depending on results from repeat injection, consider sacroiliac RF  Continue tramadol

## 2013-11-27 ENCOUNTER — Ambulatory Visit (HOSPITAL_BASED_OUTPATIENT_CLINIC_OR_DEPARTMENT_OTHER): Payer: Medicare HMO | Admitting: Physical Medicine & Rehabilitation

## 2013-11-27 ENCOUNTER — Encounter: Payer: Self-pay | Admitting: Physical Medicine & Rehabilitation

## 2013-11-27 ENCOUNTER — Encounter: Payer: Medicare HMO | Attending: Physical Medicine & Rehabilitation

## 2013-11-27 VITALS — BP 151/76 | HR 87 | Resp 14 | Ht 65.0 in | Wt 170.0 lb

## 2013-11-27 DIAGNOSIS — F17200 Nicotine dependence, unspecified, uncomplicated: Secondary | ICD-10-CM | POA: Insufficient documentation

## 2013-11-27 DIAGNOSIS — M961 Postlaminectomy syndrome, not elsewhere classified: Secondary | ICD-10-CM | POA: Insufficient documentation

## 2013-11-27 DIAGNOSIS — Z96619 Presence of unspecified artificial shoulder joint: Secondary | ICD-10-CM | POA: Insufficient documentation

## 2013-11-27 DIAGNOSIS — M545 Low back pain, unspecified: Secondary | ICD-10-CM | POA: Diagnosis not present

## 2013-11-27 DIAGNOSIS — Z981 Arthrodesis status: Secondary | ICD-10-CM | POA: Diagnosis not present

## 2013-11-27 DIAGNOSIS — I10 Essential (primary) hypertension: Secondary | ICD-10-CM | POA: Diagnosis not present

## 2013-11-27 DIAGNOSIS — Z96659 Presence of unspecified artificial knee joint: Secondary | ICD-10-CM | POA: Diagnosis not present

## 2013-11-27 DIAGNOSIS — E785 Hyperlipidemia, unspecified: Secondary | ICD-10-CM | POA: Diagnosis not present

## 2013-11-27 DIAGNOSIS — G8929 Other chronic pain: Secondary | ICD-10-CM | POA: Diagnosis not present

## 2013-11-27 DIAGNOSIS — M533 Sacrococcygeal disorders, not elsewhere classified: Secondary | ICD-10-CM

## 2013-11-27 MED ORDER — TRAMADOL HCL 50 MG PO TABS: 50.0000 mg | ORAL_TABLET | Freq: Four times a day (QID) | ORAL | Status: DC

## 2013-11-27 NOTE — Progress Notes (Signed)
  [facility]   Name: Sara Fox DOB:04-16-1941 MRN: [id number]  Date:11/27/2013  Physician: Alysia Penna, MD    Nurse/CMA: Levens,CMA/Shumaker, RN  Allergies:  Allergies  Allergen Reactions  . Celebrex [Celecoxib]   . Codeine   . Morphine And Related   . Vioxx [Rofecoxib]     Consent Signed: Yes.    Is patient diabetic? No.    Pregnant: No. LMP: No LMP recorded. Patient has had a hysterectomy. (age 73-55)  Anticoagulants: no Anti-inflammatory: no Antibiotics: no  Procedure: Bilateral Sacroliliac injection  Position: Prone Start Time: 9:47 End Time: 9:52  Fluoro Time:11 seconds   RN/CMA Levens,CMA Shumaker,RN    Time 921a 9:55    BP 151/76 172/78    Pulse 87 68    Respirations 14 14    O2 Sat 99 96    S/S 6 6    Pain Level 8/10 0/10     D/C home with [name], [name] & O X 3, D/C instructions reviewed, and sits independently.

## 2013-11-27 NOTE — Patient Instructions (Signed)
Sacroiliac injection was performed today. A combination of a naming medicine plus a cortisone medicine was injected. The injection was done under x-ray guidance. This procedure has been performed to help reduce low back and buttocks pain as well as potentially hip pain. The duration of this injection is variable lasting from hours to  Months. It may repeated if needed. 

## 2013-11-27 NOTE — Progress Notes (Signed)
Bilateral sacroiliac injections under fluoroscopic guidance  Indication: Low back and buttocks pain not relieved by medication management and other conservative care.  Informed consent was obtained after describing risks and benefits of the procedure with the patient, this includes bleeding, bruising, infection, paralysis and medication side effects. The patient wishes to proceed and has given written consent. The patient was placed in a prone position. The lumbar and sacral area was marked and prepped with Betadine. A 25-gauge 1-1/2 inch needle was inserted into the skin and subcutaneous tissue and 1 mL of 1% lidocaine was injected into each side. Then a 25-gauge 3 inch spinal needle was inserted under fluoroscopic guidance into the left sacroiliac joint. AP and lateral images were utilized. Omnipaque 180x0.5 mL under live fluoroscopy demonstrated no intravascular uptake. Then a solution containing one ML of celestone 6mg  was injected plus marcaine .25% 36mL, 2.32ml total volume. This same procedure was re peated on the right side using the same needle, injectate, and technique. Patient tolerated the procedure well. Post procedure instructions were given. Please see post procedure form.

## 2013-12-25 ENCOUNTER — Encounter: Payer: Medicare HMO | Admitting: Registered Nurse

## 2014-01-27 ENCOUNTER — Encounter: Payer: Commercial Managed Care - HMO | Admitting: Registered Nurse

## 2014-02-05 ENCOUNTER — Encounter: Payer: Self-pay | Admitting: Registered Nurse

## 2014-02-05 ENCOUNTER — Encounter: Payer: Commercial Managed Care - HMO | Attending: Physical Medicine & Rehabilitation | Admitting: Registered Nurse

## 2014-02-05 VITALS — BP 152/82 | HR 70 | Resp 14 | Ht 65.0 in | Wt 161.0 lb

## 2014-02-05 DIAGNOSIS — E785 Hyperlipidemia, unspecified: Secondary | ICD-10-CM | POA: Insufficient documentation

## 2014-02-05 DIAGNOSIS — Z981 Arthrodesis status: Secondary | ICD-10-CM | POA: Diagnosis not present

## 2014-02-05 DIAGNOSIS — F17200 Nicotine dependence, unspecified, uncomplicated: Secondary | ICD-10-CM | POA: Diagnosis not present

## 2014-02-05 DIAGNOSIS — Z96659 Presence of unspecified artificial knee joint: Secondary | ICD-10-CM | POA: Insufficient documentation

## 2014-02-05 DIAGNOSIS — Z5181 Encounter for therapeutic drug level monitoring: Secondary | ICD-10-CM | POA: Diagnosis not present

## 2014-02-05 DIAGNOSIS — M533 Sacrococcygeal disorders, not elsewhere classified: Secondary | ICD-10-CM | POA: Diagnosis not present

## 2014-02-05 DIAGNOSIS — M545 Low back pain, unspecified: Secondary | ICD-10-CM | POA: Insufficient documentation

## 2014-02-05 DIAGNOSIS — Z96619 Presence of unspecified artificial shoulder joint: Secondary | ICD-10-CM | POA: Diagnosis not present

## 2014-02-05 DIAGNOSIS — G8929 Other chronic pain: Secondary | ICD-10-CM | POA: Diagnosis not present

## 2014-02-05 DIAGNOSIS — M961 Postlaminectomy syndrome, not elsewhere classified: Secondary | ICD-10-CM | POA: Insufficient documentation

## 2014-02-05 DIAGNOSIS — I10 Essential (primary) hypertension: Secondary | ICD-10-CM | POA: Diagnosis not present

## 2014-02-05 DIAGNOSIS — Z79899 Other long term (current) drug therapy: Secondary | ICD-10-CM

## 2014-02-05 MED ORDER — TRAMADOL HCL 50 MG PO TABS: 50.0000 mg | ORAL_TABLET | Freq: Every day | ORAL | Status: DC | PRN

## 2014-02-05 NOTE — Progress Notes (Signed)
g twice a week.  Subjective:    Patient ID: Sara Fox, female    DOB: 04/12/1941, 73 y.o.   MRN: [id number]  HPI: [name] is a 73 year old female who returns for follow up for chronic pain and medication refill. She says her pain is located in her right hip. She rates her pain 6. Her current exercise regime is walking her dog three times a day and going bowling twice a week. She says she has been having increase intensity of pain, in her right knee. We will increase her tramadol to 5 times a day. Spoke with Dr. Letta Pate and he agrees with the plan. She verbalizes understanding.  Pain Inventory Average Pain 5 Pain Right Now 6 My pain is constant, burning and aching  In the last 24 hours, has pain interfered with the following? General activity 3 Relation with others 2 Enjoyment of life 2 What TIME of day is your pain at its worst? morning Sleep (in general) Fair  Pain is worse with: walking and some activites Pain improves with: medication, TENS and injections Relief from Meds: 8  Mobility walk without assistance  Function retired  Neuro/Psych bladder control problems numbness spasms  Prior Studies Any changes since last visit?  no  Physicians involved in your care Any changes since last visit?  no   Family History  Problem Relation Age of Onset  . Heart disease Mother     also HTN  . Diabetes Mother   . Colon cancer Mother   . Heart disease Brother     deceased at 28  . Hypertension Brother   . Heart attack Brother   . Hypertension Brother   . Heart disease Brother    History   Social History  . Marital Status: Married    Spouse Name: N/A    Number of Children: 2  . Years of Education: GED   Occupational History  .     Social History Main Topics  . Smoking status: Current Every Day Smoker -- 1.00 packs/day for 60 years  . Smokeless tobacco: Never Used     Comment: currently smokes 1/2ppd or less  . Alcohol Use: No  . Drug Use: No   . Sexual Activity: None   Other Topics Concern  . None   Social History Narrative  . None   Past Surgical History  Procedure Laterality Date  . Knee surgery Bilateral 2001 & 2007  . Shoulder surgery Right   . Neck surgery  2012    2012  . Back surgery  2012    lower back  . Cholecystectomy    . Abdominal hysterectomy    . Partial gastrectomy  2005    subtotal  . Transthoracic echocardiogram  06/02/2010    EF=>55%, normal LV systolic function; normal RV systolic function; mild mitral annular calcif; trace TR; AV mildly sclerotic  . Cardiac catheterization  06/1999    noncritical disease invovling PDA   Past Medical History  Diagnosis Date  . Hypertension   . Hyperlipidemia   . Stomach problems   . Cervical cancer   . Back pain   . Neck pain    BP 152/82  Pulse 70  Resp 14  Ht 5\' 5"  (1.651 m)  Wt 161 lb (73.029 kg)  BMI 26.79 kg/m2  SpO2 98%  Opioid Risk Score:   Fall Risk Score: Low Fall Risk (0-5 points)  Review of Systems     Objective:  Physical Exam  Nursing note and vitals reviewed. Constitutional: She is oriented to person, place, and time. She appears well-developed and well-nourished.  HENT:  Head: Normocephalic and atraumatic.  Neck: Normal range of motion. Neck supple.  Cardiovascular: Normal rate and regular rhythm.   Pulmonary/Chest: Effort normal and breath sounds normal.  Musculoskeletal:  Normal Muscle Bulk and Muscle Testing Reveals: Upper extremities: Decreased ROM 90 Degrees and Muscle Strength 5/5 Back without Spinal or Paraspinal Tenderness Back Brace intact Lower Extremities: Full ROM and Muscle strength 5/5 Right Knee Brace Intact Arises from chair with ease Narrow Based Gait   Neurological: She is alert and oriented to person, place, and time.  Skin: Skin is warm and dry.  Psychiatric: She has a normal mood and affect.          Assessment & Plan:  1. Bilateral sacroiliac pain after lumbar fusion.  RX: Tramadol  50 mg  one tablet 5 times daily as needed #150 S/P Bilateral sacroiliac injections  20 minutes of face to face patient care time was spent during this visit. All questions were encouraged and answered.  F/U in 6 months

## 2014-08-06 ENCOUNTER — Encounter: Payer: Medicare Other | Attending: Registered Nurse | Admitting: Registered Nurse

## 2014-08-06 ENCOUNTER — Encounter: Payer: Self-pay | Admitting: Registered Nurse

## 2014-08-06 VITALS — BP 158/76 | HR 88 | Resp 14

## 2014-08-06 DIAGNOSIS — M961 Postlaminectomy syndrome, not elsewhere classified: Secondary | ICD-10-CM | POA: Diagnosis not present

## 2014-08-06 DIAGNOSIS — G609 Hereditary and idiopathic neuropathy, unspecified: Secondary | ICD-10-CM | POA: Diagnosis not present

## 2014-08-06 DIAGNOSIS — M533 Sacrococcygeal disorders, not elsewhere classified: Secondary | ICD-10-CM | POA: Diagnosis present

## 2014-08-06 MED ORDER — GABAPENTIN 100 MG PO CAPS: 100.0000 mg | ORAL_CAPSULE | Freq: Three times a day (TID) | ORAL | Status: DC

## 2014-08-06 MED ORDER — TRAMADOL HCL 50 MG PO TABS: 50.0000 mg | ORAL_TABLET | Freq: Every day | ORAL | Status: DC | PRN

## 2014-08-06 NOTE — Patient Instructions (Signed)
Start Gabapentin tonight one capsule at bedtime for 7 days  If tingling Persists   Take Gabapentin One capsule in the morning and one capsule at bedtime for 7 days  If tingling Persists  Take Gabapentin one capsule three times a day   Call office with any questions  [phone]

## 2014-08-06 NOTE — Progress Notes (Signed)
Subjective:    Patient ID: Sara Fox, female    DOB: 1940-11-27, 74 y.o.   MRN: [id number]  HPI: Mrs. GESSICA JAWAD is a 74 year old female who returns for follow up for chronic pain and medication refill. She says her pain is located in her lower back. Also having left arm tingling sensation daily and has increased in intensity. We will prescribe gabapentin today and reviewed instructions she verbalizes understanding. She rates her pain 4. Her current exercise regime is walking her dog twice a day and going bowling twice a week.   Pain Inventory Average Pain 6 Pain Right Now 4 My pain is constant, dull, stabbing and tingling  In the last 24 hours, has pain interfered with the following? General activity 4 Relation with others 4 Enjoyment of life 3 What TIME of day is your pain at its worst? daytime and night Sleep (in general) Fair  Pain is worse with: walking and standing Pain improves with: rest and medication Relief from Meds: 7  Mobility walk without assistance how many minutes can you walk? 45 ability to climb steps?  yes do you drive?  yes  Function retired  Neuro/Psych bladder control problems tingling trouble walking  Prior Studies Any changes since last visit?  no  Physicians involved in your care Any changes since last visit?  no   Family History  Problem Relation Age of Onset  . Heart disease Mother     also HTN  . Diabetes Mother   . Colon cancer Mother   . Heart disease Brother     deceased at 72  . Hypertension Brother   . Heart attack Brother   . Hypertension Brother   . Heart disease Brother    History   Social History  . Marital Status: Married    Spouse Name: N/A  . Number of Children: 2  . Years of Education: GED   Occupational History  .     Social History Main Topics  . Smoking status: Current Every Day Smoker -- 1.00 packs/day for 60 years  . Smokeless tobacco: Never Used     Comment: currently smokes 1/2ppd or less    . Alcohol Use: No  . Drug Use: No  . Sexual Activity: Not on file   Other Topics Concern  . None   Social History Narrative   Past Surgical History  Procedure Laterality Date  . Knee surgery Bilateral 2001 & 2007  . Shoulder surgery Right   . Neck surgery  2012    2012  . Back surgery  2012    lower back  . Cholecystectomy    . Abdominal hysterectomy    . Partial gastrectomy  2005    subtotal  . Transthoracic echocardiogram  06/02/2010    EF=>55%, normal LV systolic function; normal RV systolic function; mild mitral annular calcif; trace TR; AV mildly sclerotic  . Cardiac catheterization  06/1999    noncritical disease invovling PDA   Past Medical History  Diagnosis Date  . Hypertension   . Hyperlipidemia   . Stomach problems   . Cervical cancer   . Back pain   . Neck pain    BP 158/76 mmHg  Pulse 88  Resp 14  SpO2 98%  Opioid Risk Score:   Fall Risk Score: Moderate Fall Risk (6-13 points)`1  Depression screen PHQ 2/9  = 4  No flowsheet data found.   Review of Systems  Constitutional: Negative.   HENT: Negative.  Eyes: Negative.   Respiratory: Negative.   Cardiovascular: Negative.   Gastrointestinal: Negative.   Endocrine: Negative.   Genitourinary: Positive for difficulty urinating.  Musculoskeletal: Positive for myalgias, back pain and arthralgias.  Skin: Negative.   Allergic/Immunologic: Negative.   Neurological:       Tingling, trouble walking  Hematological: Negative.   Psychiatric/Behavioral: Negative.        Objective:   Physical Exam  Constitutional: She is oriented to person, place, and time. She appears well-developed and well-nourished.  HENT:  Head: Normocephalic and atraumatic.  Neck: Normal range of motion. Neck supple.  Cardiovascular: Normal rate and regular rhythm.   Pulmonary/Chest: Effort normal and breath sounds normal.  Musculoskeletal:  Normal Muscle Bulk and Muscle Testing Reveals: Upper Extremities: Decreased ROM 90  Degrees and Muscle Strength 5/5 Lumbar Paraspinal Tenderness: L-3- L-4 Lower Extremities: Full ROM and Muscle Strength 5/5 Arises from chair with ease Narrow Based Gait  Neurological: She is alert and oriented to person, place, and time.  Skin: Skin is warm and dry.  Psychiatric: She has a normal mood and affect.  Nursing note and vitals reviewed.         Assessment & Plan:  1. Bilateral sacroiliac pain after lumbar fusion.  Refilled: Tramadol 50 mg one tablet 5 times daily as needed #150 S/P Bilateral sacroiliac injections  20 minutes of face to face patient care time was spent during this visit. All questions were encouraged and answered.  F/U in 6 months

## 2014-09-12 ENCOUNTER — Inpatient Hospital Stay (HOSPITAL_COMMUNITY)
Admission: EM | Admit: 2014-09-12 | Discharge: 2014-09-15 | DRG: 247 | Disposition: A | Discharge status: Home / Self Care | Payer: Medicare Other | Attending: Internal Medicine | Admitting: Internal Medicine

## 2014-09-12 ENCOUNTER — Encounter (HOSPITAL_COMMUNITY): Payer: Self-pay

## 2014-09-12 ENCOUNTER — Other Ambulatory Visit (HOSPITAL_COMMUNITY): Payer: Medicare Other

## 2014-09-12 ENCOUNTER — Emergency Department (HOSPITAL_COMMUNITY): Payer: Medicare Other

## 2014-09-12 ENCOUNTER — Observation Stay (HOSPITAL_COMMUNITY): Payer: Medicare Other

## 2014-09-12 ENCOUNTER — Encounter (HOSPITAL_COMMUNITY): Payer: Medicare Other

## 2014-09-12 DIAGNOSIS — Z955 Presence of coronary angioplasty implant and graft: Secondary | ICD-10-CM

## 2014-09-12 DIAGNOSIS — I5032 Chronic diastolic (congestive) heart failure: Secondary | ICD-10-CM | POA: Diagnosis not present

## 2014-09-12 DIAGNOSIS — Z903 Acquired absence of stomach [part of]: Secondary | ICD-10-CM | POA: Diagnosis present

## 2014-09-12 DIAGNOSIS — Z885 Allergy status to narcotic agent status: Secondary | ICD-10-CM

## 2014-09-12 DIAGNOSIS — G8929 Other chronic pain: Secondary | ICD-10-CM | POA: Diagnosis present

## 2014-09-12 DIAGNOSIS — I25110 Atherosclerotic heart disease of native coronary artery with unstable angina pectoris: Secondary | ICD-10-CM | POA: Diagnosis present

## 2014-09-12 DIAGNOSIS — M545 Low back pain: Secondary | ICD-10-CM | POA: Diagnosis present

## 2014-09-12 DIAGNOSIS — I214 Non-ST elevation (NSTEMI) myocardial infarction: Secondary | ICD-10-CM | POA: Diagnosis not present

## 2014-09-12 DIAGNOSIS — I129 Hypertensive chronic kidney disease with stage 1 through stage 4 chronic kidney disease, or unspecified chronic kidney disease: Secondary | ICD-10-CM | POA: Diagnosis present

## 2014-09-12 DIAGNOSIS — R079 Chest pain, unspecified: Secondary | ICD-10-CM

## 2014-09-12 DIAGNOSIS — I5022 Chronic systolic (congestive) heart failure: Secondary | ICD-10-CM | POA: Insufficient documentation

## 2014-09-12 DIAGNOSIS — I25118 Atherosclerotic heart disease of native coronary artery with other forms of angina pectoris: Secondary | ICD-10-CM | POA: Diagnosis not present

## 2014-09-12 DIAGNOSIS — E785 Hyperlipidemia, unspecified: Secondary | ICD-10-CM | POA: Diagnosis not present

## 2014-09-12 DIAGNOSIS — M533 Sacrococcygeal disorders, not elsewhere classified: Secondary | ICD-10-CM | POA: Diagnosis present

## 2014-09-12 DIAGNOSIS — Z720 Tobacco use: Secondary | ICD-10-CM | POA: Diagnosis present

## 2014-09-12 DIAGNOSIS — I255 Ischemic cardiomyopathy: Secondary | ICD-10-CM

## 2014-09-12 DIAGNOSIS — I5042 Chronic combined systolic (congestive) and diastolic (congestive) heart failure: Secondary | ICD-10-CM | POA: Diagnosis present

## 2014-09-12 DIAGNOSIS — M5442 Lumbago with sciatica, left side: Secondary | ICD-10-CM

## 2014-09-12 DIAGNOSIS — N182 Chronic kidney disease, stage 2 (mild): Secondary | ICD-10-CM | POA: Diagnosis not present

## 2014-09-12 DIAGNOSIS — F17210 Nicotine dependence, cigarettes, uncomplicated: Secondary | ICD-10-CM | POA: Diagnosis present

## 2014-09-12 DIAGNOSIS — D649 Anemia, unspecified: Secondary | ICD-10-CM | POA: Diagnosis present

## 2014-09-12 DIAGNOSIS — Z7951 Long term (current) use of inhaled steroids: Secondary | ICD-10-CM

## 2014-09-12 DIAGNOSIS — Z8249 Family history of ischemic heart disease and other diseases of the circulatory system: Secondary | ICD-10-CM

## 2014-09-12 DIAGNOSIS — Z7982 Long term (current) use of aspirin: Secondary | ICD-10-CM

## 2014-09-12 DIAGNOSIS — R0789 Other chest pain: Secondary | ICD-10-CM

## 2014-09-12 DIAGNOSIS — I10 Essential (primary) hypertension: Secondary | ICD-10-CM | POA: Diagnosis present

## 2014-09-12 DIAGNOSIS — I259 Chronic ischemic heart disease, unspecified: Secondary | ICD-10-CM | POA: Diagnosis present

## 2014-09-12 DIAGNOSIS — Z888 Allergy status to other drugs, medicaments and biological substances status: Secondary | ICD-10-CM

## 2014-09-12 LAB — URINALYSIS, ROUTINE W REFLEX MICROSCOPIC
Bilirubin Urine: NEGATIVE
Glucose, UA: NEGATIVE mg/dL
Hgb urine dipstick: NEGATIVE
Ketones, ur: NEGATIVE mg/dL
Leukocytes, UA: NEGATIVE
Nitrite: NEGATIVE
Protein, ur: NEGATIVE mg/dL
Specific Gravity, Urine: 1.01 (ref 1.005–1.030)
Urobilinogen, UA: 0.2 mg/dL (ref 0.0–1.0)
pH: 6.5 (ref 5.0–8.0)

## 2014-09-12 LAB — BASIC METABOLIC PANEL
Anion gap: 9 (ref 5–15)
BUN: 13 mg/dL (ref 6–23)
CO2: 23 mmol/L (ref 19–32)
Calcium: 9 mg/dL (ref 8.4–10.5)
Chloride: 107 mmol/L (ref 96–112)
Creatinine, Ser: 0.91 mg/dL (ref 0.50–1.10)
GFR calc Af Amer: 70 mL/min — ABNORMAL LOW (ref >90)
GFR calc non Af Amer: 60 mL/min — ABNORMAL LOW (ref >90)
Glucose, Bld: 127 mg/dL — ABNORMAL HIGH (ref 70–99)
Potassium: 4.1 mmol/L (ref 3.5–5.1)
Sodium: 139 mmol/L (ref 135–145)

## 2014-09-12 LAB — BRAIN NATRIURETIC PEPTIDE: B Natriuretic Peptide: 39.2 pg/mL (ref 0.0–100.0)

## 2014-09-12 LAB — HEPATIC FUNCTION PANEL
ALT: 19 U/L (ref 0–35)
AST: 30 U/L (ref 0–37)
Albumin: 3.3 g/dL — ABNORMAL LOW (ref 3.5–5.2)
Alkaline Phosphatase: 80 U/L (ref 39–117)
Bilirubin, Direct: 0.1 mg/dL (ref 0.0–0.5)
Indirect Bilirubin: 0.4 mg/dL (ref 0.3–0.9)
Total Bilirubin: 0.5 mg/dL (ref 0.3–1.2)
Total Protein: 6.3 g/dL (ref 6.0–8.3)

## 2014-09-12 LAB — TROPONIN I
Troponin I: 0.03 ng/mL (ref <0.031)
Troponin I: 0.09 ng/mL — ABNORMAL HIGH (ref <0.031)

## 2014-09-12 LAB — LIPASE, BLOOD: Lipase: 21 U/L (ref 11–59)

## 2014-09-12 LAB — CBC
HCT: 34.5 % — ABNORMAL LOW (ref 36.0–46.0)
Hemoglobin: 11 g/dL — ABNORMAL LOW (ref 12.0–15.0)
MCH: 31.4 pg (ref 26.0–34.0)
MCHC: 31.9 g/dL (ref 30.0–36.0)
MCV: 98.6 fL (ref 78.0–100.0)
Platelets: 390 10*3/uL (ref 150–400)
RBC: 3.5 MIL/uL — ABNORMAL LOW (ref 3.87–5.11)
RDW: 13.2 % (ref 11.5–15.5)
WBC: 8.1 10*3/uL (ref 4.0–10.5)

## 2014-09-12 LAB — LIPID PANEL
Cholesterol: 158 mg/dL (ref 0–200)
HDL: 45 mg/dL (ref >39)
LDL Cholesterol: 101 mg/dL — ABNORMAL HIGH (ref 0–99)
Total CHOL/HDL Ratio: 3.5 RATIO
Triglycerides: 61 mg/dL (ref <150)
VLDL: 12 mg/dL (ref 0–40)

## 2014-09-12 LAB — I-STAT TROPONIN, ED: Troponin i, poc: 0.01 ng/mL (ref 0.00–0.08)

## 2014-09-12 MED ORDER — METOPROLOL TARTRATE 25 MG PO TABS
25.0000 mg | ORAL_TABLET | Freq: Two times a day (BID) | ORAL | Status: DC
  Administered 2014-09-12 – 2014-09-14 (×5): 25 mg via ORAL
  Filled 2014-09-12 (×5): qty 1

## 2014-09-12 MED ORDER — HEPARIN BOLUS VIA INFUSION
2000.0000 [IU] | Freq: Once | INTRAVENOUS | Status: AC
  Administered 2014-09-12: 2000 [IU] via INTRAVENOUS
  Filled 2014-09-12: qty 2000

## 2014-09-12 MED ORDER — BUDESONIDE-FORMOTEROL FUMARATE 160-4.5 MCG/ACT IN AERO: 2.0000 | INHALATION_SPRAY | Freq: Two times a day (BID) | RESPIRATORY_TRACT | Status: DC

## 2014-09-12 MED ORDER — TECHNETIUM TC 99M SESTAMIBI GENERIC - CARDIOLITE
10.0000 | Freq: Once | INTRAVENOUS | Status: AC | PRN
  Administered 2014-09-12: 10 via INTRAVENOUS

## 2014-09-12 MED ORDER — HEPARIN (PORCINE) IN NACL 100-0.45 UNIT/ML-% IJ SOLN
1200.0000 [IU]/h | INTRAMUSCULAR | Status: DC
  Administered 2014-09-12: 1000 [IU]/h via INTRAVENOUS
  Administered 2014-09-13: 1200 [IU]/h via INTRAVENOUS
  Filled 2014-09-12 (×3): qty 250

## 2014-09-12 MED ORDER — GI COCKTAIL ~~LOC~~
30.0000 mL | Freq: Four times a day (QID) | ORAL | Status: DC | PRN
  Filled 2014-09-12: qty 30

## 2014-09-12 MED ORDER — ONDANSETRON HCL 4 MG/2ML IJ SOLN: 4.0000 mg | Freq: Four times a day (QID) | INTRAMUSCULAR | Status: DC | PRN

## 2014-09-12 MED ORDER — TRAMADOL HCL 50 MG PO TABS
50.0000 mg | ORAL_TABLET | Freq: Four times a day (QID) | ORAL | Status: DC | PRN
  Administered 2014-09-14: 50 mg via ORAL
  Filled 2014-09-12: qty 1

## 2014-09-12 MED ORDER — PRAVASTATIN SODIUM 20 MG PO TABS
20.0000 mg | ORAL_TABLET | Freq: Every day | ORAL | Status: DC
  Administered 2014-09-12 – 2014-09-14 (×3): 20 mg via ORAL
  Filled 2014-09-12 (×4): qty 1

## 2014-09-12 MED ORDER — REGADENOSON 0.4 MG/5ML IV SOLN
0.4000 mg | Freq: Once | INTRAVENOUS | Status: AC
  Administered 2014-09-12: 0.4 mg via INTRAVENOUS
  Filled 2014-09-12: qty 5

## 2014-09-12 MED ORDER — ASPIRIN 325 MG PO TABS
325.0000 mg | ORAL_TABLET | Freq: Every day | ORAL | Status: DC
Start: 2014-09-12 — End: 2014-09-14
  Administered 2014-09-12 – 2014-09-14 (×3): 325 mg via ORAL
  Filled 2014-09-12 (×3): qty 1

## 2014-09-12 MED ORDER — GABAPENTIN 100 MG PO CAPS
100.0000 mg | ORAL_CAPSULE | Freq: Three times a day (TID) | ORAL | Status: DC
  Administered 2014-09-12 – 2014-09-15 (×10): 100 mg via ORAL
  Filled 2014-09-12 (×12): qty 1

## 2014-09-12 MED ORDER — GI COCKTAIL ~~LOC~~
30.0000 mL | Freq: Once | ORAL | Status: AC
  Administered 2014-09-12: 30 mL via ORAL
  Filled 2014-09-12: qty 30

## 2014-09-12 MED ORDER — TECHNETIUM TC 99M SESTAMIBI GENERIC - CARDIOLITE
30.0000 | Freq: Once | INTRAVENOUS | Status: AC | PRN
  Administered 2014-09-12: 30 via INTRAVENOUS

## 2014-09-12 MED ORDER — MORPHINE SULFATE 2 MG/ML IJ SOLN: 2.0000 mg | INTRAMUSCULAR | Status: DC | PRN

## 2014-09-12 MED ORDER — HEPARIN SODIUM (PORCINE) 5000 UNIT/ML IJ SOLN
5000.0000 [IU] | Freq: Three times a day (TID) | INTRAMUSCULAR | Status: DC
  Administered 2014-09-12 (×3): 5000 [IU] via SUBCUTANEOUS
  Filled 2014-09-12 (×3): qty 1

## 2014-09-12 MED ORDER — MULTIVITAMINS PO CAPS
1.0000 | ORAL_CAPSULE | Freq: Every day | ORAL | Status: DC
  Administered 2014-09-12 – 2014-09-13 (×2): 1 via ORAL
  Filled 2014-09-12 (×3): qty 1

## 2014-09-12 MED ORDER — REGADENOSON 0.4 MG/5ML IV SOLN
INTRAVENOUS | Status: AC
  Filled 2014-09-12: qty 5

## 2014-09-12 MED ORDER — ACETAMINOPHEN 325 MG PO TABS
650.0000 mg | ORAL_TABLET | ORAL | Status: DC | PRN
  Administered 2014-09-14 (×3): 650 mg via ORAL
  Filled 2014-09-12 (×3): qty 2

## 2014-09-12 NOTE — ED Notes (Signed)
Appointment time @ 8:10a

## 2014-09-12 NOTE — Progress Notes (Signed)
   Lexiscan cardiolite returned with evidence of ischemia and LV dysfxn (EF 32% - previously nl).  Discussed with pt and dtr Sara Fox).  Will check echo to better eval EF and tentatively plan on cath on Monday pending review of imaging by Dr. Sallyanne Kuster tomorrow.  IMPRESSION: 1. Mild medium-size reversible myocardial defect in the inferior wall, suspicious for an distal myocardial ischemia.   2. Global left ventricular hypokinesis and moderate left ventricular dilatation.   3. Left ventricular ejection fraction 32%   4. High-risk stress test findings*.  Murray Hodgkins, NP 09/12/2014, 5:06 PM

## 2014-09-12 NOTE — ED Notes (Signed)
Per [facility]: Pt coming from home, about 0100 pt started having chest pain, that radiated to back. Pt was not nauseated. Pt is short of breath when pain is worse. Pain was 10/10, after 2 nitro pain was 8/10. Pt on 2 L [place] for comfort.    Given by [facility]: 324 ASA and 2 nitro

## 2014-09-12 NOTE — Consult Note (Signed)
Reason for Consult: Chest pain, known CAD  Requesting Physician: [place] Pink  Cardiologist: Hilty  HPI: This is a 74 y.o. female with a past medical history significant for moderate CAD, diastolic dysfunction presents with chest pain responsive to O2 and NTG. Had a very exciting day that included a surprise birthday party and dancing. Symptoms began later last night. No pain free, resting well. ECG and first set cardiac enzymes are low risk. Other than mild ankle swelling, no other CV complaints.  PMHx:  Past Medical History  Diagnosis Date  . Hypertension   . Hyperlipidemia   . Stomach problems   . Cervical cancer   . Back pain   . Neck pain    Past Surgical History  Procedure Laterality Date  . Knee surgery Bilateral 2001 & 2007  . Shoulder surgery Right   . Neck surgery  2012    2012  . Back surgery  2012    lower back  . Cholecystectomy    . Abdominal hysterectomy    . Partial gastrectomy  2005    subtotal  . Transthoracic echocardiogram  06/02/2010    EF=>55%, normal LV systolic function; normal RV systolic function; mild mitral annular calcif; trace TR; AV mildly sclerotic  . Cardiac catheterization  06/1999    noncritical disease invovling PDA    FAMHx: Family History  Problem Relation Age of Onset  . Heart disease Mother     also HTN  . Diabetes Mother   . Colon cancer Mother   . Heart disease Brother     deceased at 41  . Hypertension Brother   . Heart attack Brother   . Hypertension Brother   . Heart disease Brother     SOCHx:  reports that she has been smoking.  She has never used smokeless tobacco. She reports that she does not drink alcohol or use illicit drugs.  ALLERGIES: Allergies  Allergen Reactions  . Morphine And Related Shortness Of Breath    Throat swelling/trouble breathing and lethargic  . Celebrex [Celecoxib]   . Codeine   . Vioxx [Rofecoxib]     ROS: A comprehensive review of systems was negative.  HOME  MEDICATIONS: Prescriptions prior to admission  Medication Sig Dispense Refill Last Dose  . aspirin 325 MG tablet Take 325 mg by mouth daily.     09/11/2014 at Unknown time  . CALCIUM CITRATE PO Take 1 tablet by mouth daily.    09/11/2014 at Unknown time  . Cholecalciferol (VITAMIN D3) 2000 UNITS capsule Take 2,000 Units by mouth daily.   09/11/2014 at Unknown time  . gabapentin (NEURONTIN) 100 MG capsule Take 1 capsule (100 mg total) by mouth 3 (three) times daily. 90 capsule 3 09/11/2014 at Unknown time  . ibuprofen (ADVIL,MOTRIN) 200 MG tablet Take 1,000-1,200 mg by mouth every 6 (six) hours as needed for pain.    09/11/2014 at Unknown time  . lovastatin (MEVACOR) 20 MG tablet Take 20 mg by mouth at bedtime.   09/11/2014 at Unknown time  . Multiple Vitamin (MULTIVITAMIN) capsule Take 1 capsule by mouth daily. Contains iron   09/11/2014 at Unknown time  . traMADol (ULTRAM) 50 MG tablet Take 1 tablet (50 mg total) by mouth 5 (five) times daily as needed. 150 tablet 5 09/11/2014 at Unknown time  . vitamin C (ASCORBIC ACID) 500 MG tablet Take 500 mg by mouth daily.   09/11/2014 at Unknown time  . budesonide-formoterol (SYMBICORT) 160-4.5 MCG/ACT inhaler Inhale 2 puffs into  the lungs 2 (two) times daily.   Not Taking at Unknown time  . furosemide (LASIX) 20 MG tablet Take 20 mg by mouth daily.   Taking    HOSPITAL MEDICATIONS: Prior to Admission:  Prescriptions prior to admission  Medication Sig Dispense Refill Last Dose  . aspirin 325 MG tablet Take 325 mg by mouth daily.     09/11/2014 at Unknown time  . CALCIUM CITRATE PO Take 1 tablet by mouth daily.    09/11/2014 at Unknown time  . Cholecalciferol (VITAMIN D3) 2000 UNITS capsule Take 2,000 Units by mouth daily.   09/11/2014 at Unknown time  . gabapentin (NEURONTIN) 100 MG capsule Take 1 capsule (100 mg total) by mouth 3 (three) times daily. 90 capsule 3 09/11/2014 at Unknown time  . ibuprofen (ADVIL,MOTRIN) 200 MG tablet Take 1,000-1,200 mg by mouth  every 6 (six) hours as needed for pain.    09/11/2014 at Unknown time  . lovastatin (MEVACOR) 20 MG tablet Take 20 mg by mouth at bedtime.   09/11/2014 at Unknown time  . Multiple Vitamin (MULTIVITAMIN) capsule Take 1 capsule by mouth daily. Contains iron   09/11/2014 at Unknown time  . traMADol (ULTRAM) 50 MG tablet Take 1 tablet (50 mg total) by mouth 5 (five) times daily as needed. 150 tablet 5 09/11/2014 at Unknown time  . vitamin C (ASCORBIC ACID) 500 MG tablet Take 500 mg by mouth daily.   09/11/2014 at Unknown time  . budesonide-formoterol (SYMBICORT) 160-4.5 MCG/ACT inhaler Inhale 2 puffs into the lungs 2 (two) times daily.   Not Taking at Unknown time  . furosemide (LASIX) 20 MG tablet Take 20 mg by mouth daily.   Taking    VITALS: Blood pressure 134/65, pulse 90, temperature 98.2 F (36.8 C), temperature source Oral, resp. rate 18, height '5\' 5"'$  (1.651 m), weight 154 lb 12.2 oz (70.2 kg), SpO2 100 %.  PHYSICAL EXAM:  General: Alert, oriented x3, no distress Head: no evidence of trauma, PERRL, EOMI, no exophtalmos or lid lag, no myxedema, no xanthelasma; normal ears, nose and oropharynx Neck: normal jugular venous pulsations and no hepatojugular reflux; brisk carotid pulses without delay and no carotid bruits Chest: clear to auscultation, no signs of consolidation by percussion or palpation, normal fremitus, symmetrical and full respiratory excursions Cardiovascular: normal position and quality of the apical impulse, regular rhythm, normal first heart sound and normal second heart sound, no rubs or gallops, 2/6 early peaking aortic ejection murmur Abdomen: no tenderness or distention, no masses by palpation, no abnormal pulsatility or arterial bruits, normal bowel sounds, no hepatosplenomegaly Extremities: no clubbing, cyanosis;  1+ symmetrcal ankle edema; 2+ radial, ulnar and brachial pulses bilaterally; 2+ right femoral, posterior tibial and dorsalis pedis pulses; 2+ left femoral, posterior  tibial and dorsalis pedis pulses; no subclavian or femoral bruits Neurological: grossly nonfocal   LABS  CBC  Recent Labs  09/12/14 0511  WBC 8.1  HGB 11.0*  HCT 34.5*  MCV 98.6  PLT 024   Basic Metabolic Panel  Recent Labs  09/12/14 0511  NA 139  K 4.1  CL 107  CO2 23  GLUCOSE 127*  BUN 13  CREATININE 0.91  CALCIUM 9.0   Liver Function Tests  Recent Labs  09/12/14 0511  AST 30  ALT 19  ALKPHOS 80  BILITOT 0.5  PROT 6.3  ALBUMIN 3.3*    Recent Labs  09/12/14 0511  LIPASE 21   Cardiac Enzymes No results for input(s): CKTOTAL, CKMB, CKMBINDEX, TROPONINI in  the last 72 hours. BNP Invalid input(s): POCBNP D-Dimer No results for input(s): DDIMER in the last 72 hours. Hemoglobin A1C No results for input(s): HGBA1C in the last 72 hours. Fasting Lipid Panel No results for input(s): CHOL, HDL, LDLCALC, TRIG, CHOLHDL, LDLDIRECT in the last 72 hours. Thyroid Function Tests No results for input(s): TSH, T4TOTAL, T3FREE, THYROIDAB in the last 72 hours.  Invalid input(s): FREET3    IMAGING: Dg Chest Port 1 View  09/12/2014   CLINICAL DATA:  Chest pain. History of hypertension, cervical cancer and hyperlipidemia.  EXAM: PORTABLE CHEST - 1 VIEW  COMPARISON:  Chest radiograph April 29, 2010  FINDINGS: Cardiac silhouette is normal in appearance. Calcified aortic knob. Mild bronchitic change without pleural effusion or focal consolidation. No pneumothorax. RIGHT shoulder arthroplasty. Partially imaged severe degenerative change of the LEFT shoulder.  IMPRESSION: Similar bronchitic changes without focal consolidation.   Electronically Signed   By: Elon Alas   On: 09/12/2014 05:52    ECG: NSR, QS V1-V2  (old) no repol changes  TELEMETRY: NSR  IMPRESSION/RECOMMENDATION: 1. Chest pain is concerning for coronary insufficiency, but ECG and enzymes are low risk. Plan Lexiscan Myoview in AM if serial enzymes remain low. If frank elevation in troponin,  repeat coronary angio Monday. No indication for heparin at this time. She is on ASA, statin, beta blocker.    Time Spent Directly with Patient: 45 minutes  Sanda Klein, MD, [facility] [phone] office [phone] pager   09/12/2014, 10:01 AM

## 2014-09-12 NOTE — ED Notes (Signed)
Dr. Sharol Given at bedside updating patient and family on results and plan for admission. Patient and family agreeable to plan.

## 2014-09-12 NOTE — ED Provider Notes (Signed)
CSN: [id number]     Arrival date & time 09/12/14  0451 History   First MD Initiated Contact with Patient 09/12/14 [phone]     Chief Complaint  Patient presents with  . Chest Pain     (Consider location/radiation/quality/duration/timing/severity/associated sxs/prior Treatment) HPI 74 year old female presents to the emergency department from home via EMS with complaint of chest pain.  Patient reports pain started around 11:30.  Pain was central with radiation into her back.  No radiation of the jaw or the arm.  Shortness of breath, no cough, no fever.  Patient took Aleve and put her husband's oxygen on which seemed to help it somewhat.  At 1 AM, the pain came back and was much worse.  Patient reports pain improved with nitroglycerin given to her by EMS.  Patient has history of hypertension, hyperlipidemia.  She had a heart catheterization in 2001, but no workup since that time. Past Medical History  Diagnosis Date  . Hypertension   . Hyperlipidemia   . Stomach problems   . Cervical cancer   . Back pain   . Neck pain    Past Surgical History  Procedure Laterality Date  . Knee surgery Bilateral 2001 & 2007  . Shoulder surgery Right   . Neck surgery  2012    2012  . Back surgery  2012    lower back  . Cholecystectomy    . Abdominal hysterectomy    . Partial gastrectomy  2005    subtotal  . Transthoracic echocardiogram  06/02/2010    EF=>55%, normal LV systolic function; normal RV systolic function; mild mitral annular calcif; trace TR; AV mildly sclerotic  . Cardiac catheterization  06/1999    noncritical disease invovling PDA   Family History  Problem Relation Age of Onset  . Heart disease Mother     also HTN  . Diabetes Mother   . Colon cancer Mother   . Heart disease Brother     deceased at 40  . Hypertension Brother   . Heart attack Brother   . Hypertension Brother   . Heart disease Brother    History  Substance Use Topics  . Smoking status: Current Every Day Smoker --  1.00 packs/day for 60 years  . Smokeless tobacco: Never Used     Comment: currently smokes 1/2ppd or less  . Alcohol Use: No   OB History    No data available     Review of Systems  See History of Present Illness; otherwise all other systems are reviewed and negative   Allergies  Celebrex; Codeine; Morphine and related; and Vioxx  Home Medications   Prior to Admission medications   Medication Sig Start Date End Date Taking? Authorizing Provider  aspirin 325 MG tablet Take 325 mg by mouth daily.      Historical Provider, MD  budesonide-formoterol (SYMBICORT) 160-4.5 MCG/ACT inhaler Inhale 2 puffs into the lungs 2 (two) times daily.    Historical Provider, MD  CALCIUM CITRATE PO Take 1 tablet by mouth daily.     Historical Provider, MD  Cholecalciferol (VITAMIN D3) 2000 UNITS capsule Take 2,000 Units by mouth daily.    Historical Provider, MD  furosemide (LASIX) 20 MG tablet Take 20 mg by mouth daily.    Historical Provider, MD  gabapentin (NEURONTIN) 100 MG capsule Take 1 capsule (100 mg total) by mouth 3 (three) times daily. 08/06/14   Bayard Hugger, NP  ibuprofen (ADVIL,MOTRIN) 200 MG tablet Take 1,000-1,200 mg by mouth every 6 (  six) hours as needed for pain.     Historical Provider, MD  lovastatin (MEVACOR) 20 MG tablet Take 20 mg by mouth at bedtime.    Historical Provider, MD  Multiple Vitamin (MULTIVITAMIN) capsule Take 1 capsule by mouth daily. Contains iron    Historical Provider, MD  traMADol (ULTRAM) 50 MG tablet Take 1 tablet (50 mg total) by mouth 5 (five) times daily as needed. 08/06/14   Bayard Hugger, NP  vitamin C (ASCORBIC ACID) 500 MG tablet Take 500 mg by mouth daily.    Historical Provider, MD   BP 146/67 mmHg  Pulse 78  Temp(Src) 99.7 F (37.6 C) (Oral)  Resp 21  Ht '5\' 5"'$  (1.651 m)  Wt 152 lb (68.947 kg)  BMI 25.29 kg/m2  SpO2 97% Physical Exam  Constitutional: She is oriented to person, place, and time. She appears well-developed and well-nourished.   HENT:  Head: Normocephalic and atraumatic.  Nose: Nose normal.  Mouth/Throat: Oropharynx is clear and moist.  Eyes: Conjunctivae and EOM are normal. Pupils are equal, round, and reactive to light.  Neck: Normal range of motion. Neck supple. No JVD present. No tracheal deviation present. No thyromegaly present.  Cardiovascular: Normal rate, regular rhythm, normal heart sounds and intact distal pulses.  Exam reveals no gallop and no friction rub.   No murmur heard. Pulmonary/Chest: Effort normal and breath sounds normal. No stridor. No respiratory distress. She has no wheezes. She has no rales. She exhibits no tenderness.  Abdominal: Soft. Bowel sounds are normal. She exhibits no distension and no mass. There is tenderness (patient has tenderness in epigastrium, reports this is chronic). There is no rebound and no guarding.  Musculoskeletal: Normal range of motion. She exhibits no edema or tenderness.  Lymphadenopathy:    She has no cervical adenopathy.  Neurological: She is alert and oriented to person, place, and time. She displays normal reflexes. She exhibits normal muscle tone. Coordination normal.  Skin: Skin is warm and dry. No rash noted. No erythema. No pallor.  Psychiatric: She has a normal mood and affect. Her behavior is normal. Judgment and thought content normal.  Nursing note and vitals reviewed.   ED Course  Procedures (including critical care time) Labs Review Labs Reviewed  CBC - Abnormal; Notable for the following:    RBC 3.50 (*)    Hemoglobin 11.0 (*)    HCT 34.5 (*)    All other components within normal limits  BASIC METABOLIC PANEL - Abnormal; Notable for the following:    Glucose, Bld 127 (*)    GFR calc non Af Amer 60 (*)    GFR calc Af Amer 70 (*)    All other components within normal limits  HEPATIC FUNCTION PANEL - Abnormal; Notable for the following:    Albumin 3.3 (*)    All other components within normal limits  LIPASE, BLOOD  BRAIN NATRIURETIC  PEPTIDE  I-STAT TROPOININ, ED    Imaging Review Dg Chest Port 1 View  09/12/2014   CLINICAL DATA:  Chest pain. History of hypertension, cervical cancer and hyperlipidemia.  EXAM: PORTABLE CHEST - 1 VIEW  COMPARISON:  Chest radiograph April 29, 2010  FINDINGS: Cardiac silhouette is normal in appearance. Calcified aortic knob. Mild bronchitic change without pleural effusion or focal consolidation. No pneumothorax. RIGHT shoulder arthroplasty. Partially imaged severe degenerative change of the LEFT shoulder.  IMPRESSION: Similar bronchitic changes without focal consolidation.   Electronically Signed   By: Elon Alas   On: 09/12/2014 05:52  EKG Interpretation   Date/Time:  Saturday September 12 2014 04:56:04 EDT Ventricular Rate:  81 PR Interval:  169 QRS Duration: 94 QT Interval:  398 QTC Calculation: 462 R Axis:   44 Text Interpretation:  Sinus rhythm Anteroseptal infarct, old Confirmed by  Arisbeth Purrington  MD, Harlea Goetzinger ([id number]) on 09/12/2014 5:03:44 AM      MDM   Final diagnoses:  Chest pain, unspecified chest pain type    74 year old female with intermittent chest pain since 11:30 last night that is worsening in nature.  Patient has risk factors for coronary disease, has not had recent workup for her chest pain.  Patient has seen cardiology recently, and as she was not having chest pain was not thought to be in need of further workup.  Plan for labs, chest x-ray.  Patient received GI cocktail to see if this helps with her symptoms.  Feel that patient most likely will need admission for chest pain, rule out    Linton Flemings, MD 09/12/14 [phone]

## 2014-09-12 NOTE — Progress Notes (Addendum)
ANTICOAGULATION CONSULT NOTE - Initial Consult  Pharmacy Consult for Heparin Indication: chest pain/ACS  Allergies  Allergen Reactions  . Morphine And Related Shortness Of Breath    Throat swelling/trouble breathing and lethargic  . Celebrex [Celecoxib]   . Codeine   . Vioxx [Rofecoxib]     Patient Measurements: Height: '5\' 5"'$  (165.1 cm) Weight: 154 lb 12.2 oz (70.2 kg) IBW/kg (Calculated) : 57  Vital Signs: Temp: 98.7 F (37.1 C) (04/30 2000) Temp Source: Oral (04/30 2000) BP: 139/64 mmHg (04/30 2000) Pulse Rate: 74 (04/30 2000)  Labs:  Recent Labs  09/12/14 0511 09/12/14 1041 09/12/14 1640  HGB 11.0*  --   --   HCT 34.5*  --   --   PLT 390  --   --   CREATININE 0.91  --   --   TROPONINI  --  0.03 0.09*    Estimated Creatinine Clearance: 52.5 mL/min (by C-G formula based on Cr of 0.91).   Medical History: Past Medical History  Diagnosis Date  . Hypertension   . Hyperlipidemia   . Stomach problems   . Cervical cancer   . Back pain   . Neck pain     Medications:  Prescriptions prior to admission  Medication Sig Dispense Refill Last Dose  . aspirin 325 MG tablet Take 325 mg by mouth daily.     09/11/2014 at Unknown time  . CALCIUM CITRATE PO Take 1 tablet by mouth daily.    09/11/2014 at Unknown time  . Cholecalciferol (VITAMIN D3) 2000 UNITS capsule Take 2,000 Units by mouth daily.   09/11/2014 at Unknown time  . gabapentin (NEURONTIN) 100 MG capsule Take 1 capsule (100 mg total) by mouth 3 (three) times daily. 90 capsule 3 09/11/2014 at Unknown time  . ibuprofen (ADVIL,MOTRIN) 200 MG tablet Take 1,000-1,200 mg by mouth every 6 (six) hours as needed for pain.    09/11/2014 at Unknown time  . lovastatin (MEVACOR) 20 MG tablet Take 20 mg by mouth at bedtime.   09/11/2014 at Unknown time  . Multiple Vitamin (MULTIVITAMIN) capsule Take 1 capsule by mouth daily. Contains iron   09/11/2014 at Unknown time  . traMADol (ULTRAM) 50 MG tablet Take 1 tablet (50 mg total)  by mouth 5 (five) times daily as needed. 150 tablet 5 09/11/2014 at Unknown time  . vitamin C (ASCORBIC ACID) 500 MG tablet Take 500 mg by mouth daily.   09/11/2014 at Unknown time  . budesonide-formoterol (SYMBICORT) 160-4.5 MCG/ACT inhaler Inhale 2 puffs into the lungs 2 (two) times daily.   Not Taking at Unknown time  . furosemide (LASIX) 20 MG tablet Take 20 mg by mouth daily.   Taking    Assessment: 74 y.o. female with chest pain for heparin   Goal of Therapy:  Heparin level 0.3-0.7 units/ml Monitor platelets by anticoagulation protocol: Yes   Plan:  D/C SQ heparin Heparin 2000 units IV bolus, then start heparin 1000 units/hr Follow-up am labs.  Caryl Pina 09/12/2014,11:01 PM

## 2014-09-12 NOTE — H&P (Signed)
[facility] History and Physical                                                                                    Sara Fox, is a 74 y.o. female  MRN: [id number]   DOB - 08-17-1940  Admit Date - 09/12/2014  Outpatient Primary MD for the patient is Maximino Greenland, MD  Referring MD: Dr. Sharol Given / emergency room  With History of -  Past Medical History  Diagnosis Date  . Hypertension   . Hyperlipidemia   . Stomach problems   . Cervical cancer   . Back pain   . Neck pain       Past Surgical History  Procedure Laterality Date  . Knee surgery Bilateral 2001 & 2007  . Shoulder surgery Right   . Neck surgery  2012    2012  . Back surgery  2012    lower back  . Cholecystectomy    . Abdominal hysterectomy    . Partial gastrectomy  2005    subtotal  . Transthoracic echocardiogram  06/02/2010    EF=>55%, normal LV systolic function; normal RV systolic function; mild mitral annular calcif; trace TR; AV mildly sclerotic  . Cardiac catheterization  06/1999    noncritical disease invovling PDA    in for   Chief Complaint  Patient presents with  . Chest Pain     HPI This is a 74 yo female patient with remote history of nonobstructive CAD involving the PDA found on cardiac catheterization in 2001, continued tobacco abuse, hypertension, dyslipidemia, prior gastric ulcers requiring gastrectomy. She also has issues with chronic back pain after laminectomy and follows with [facility] for pain management. Patient presented to the hospital after having issues with 3 episodes of concerning chest pain. Patient is typically very active and was out walking her dog yesterday and walked about several houses passed her home when she began having a grabbing sensation and a squeezing sensation in her chest. No other associated symptoms. The pain was significant enough she have to stop. The symptoms resolved with rest. She was able to walk back home with no further symptoms. Last  night she had a typical night. She was given a surprise birthday party. She did not consume any alcoholic beverages. She drank a small cup of coffee and went to bed which is her typical routine. She was awakened about 11:30 PM with a similar squeezing chest pain and this time it radiated to her back. She took an Aleve and he lost her husband's oxygen which seemed to help the pain and she was able to get back to sleep. About 1 AM the pain came back and was much worse. This was also associated with some discomfort in her left arm that she has been describing as numbness and tingling radiating to the fingers starting at the elbow level. At this point she called EMS. Patient was given nitroglycerin by EMS which decreased her pain. She reports that these episodes of chest pain were much more severe than the chest pain that prompted the cardiac catheterization 2001. She was also given a GI cocktail which further reduced her pain and  at time of my evaluation patient was chest pain-free. Patient has previously discussed the left arm pain (which occurred independently last week without chest pain) with her pain management clinic and she was prescribed gabapentin. Patient has not had any concerning GI symptoms such as reflux, similar chest pain/squeezing symptoms after eating, no nausea after eating, no emesis, no dark or bloody stools, no regularly occurring bloating symptoms.  In the ER she had a low-grade temperature of 90.7, she was hemodynamically stable and in sinus rhythm. She had room air saturations of 99%. Initial EKG was without acute ischemic changes. Electrolyte panel was within normal limits. BNP 39.2. Troponin 0.01. Glucose elevated at 127. Hemoglobin slightly low at 11 but improved compared to baseline in 2012 where she had readings between 7.6 and 10.6. Chest x-ray was unremarkable except for stable bronchitic changes.  Review of Systems   In addition to the HPI above,  No Fever-chills, myalgias or  other constitutional symptoms No Headache, changes with Vision or hearing, new weakness No problems swallowing food or Liquids, indigestion/reflux No Cough or Shortness of Breath, palpitations, orthopnea or DOE No Abdominal pain, N/V; no melena or hematochezia, no dark tarry stools, Bowel movements are regular, No dysuria, hematuria or flank pain No new skin rashes, lesions, masses or bruises, No new joints pains-aches No recent weight gain or loss No polyuria, polydypsia or polyphagia,  *A full 10 point Review of Systems was done, except as stated above, all other Review of Systems were negative.  Social History History  Substance Use Topics  . Smoking status: Current Every Day Smoker -- 1.00 packs/day for 60 years  . Smokeless tobacco: Never Used     Comment: currently smokes 1/2ppd or less  . Alcohol Use: No    Resides at: Private home  Lives with: Husband  Ambulatory status: Ambulatory without assistive devices prior to admission   Family History Family History  Problem Relation Age of Onset  . Heart disease Mother     also HTN  . Diabetes Mother   . Colon cancer Mother   . Heart disease Brother     deceased at 31  . Hypertension Brother   . Heart attack Brother   . Hypertension Brother   . Heart disease Brother     Prior to Admission medications   Medication Sig Start Date End Date Taking? Authorizing Provider  aspirin 325 MG tablet Take 325 mg by mouth daily.     Yes Historical Provider, MD  CALCIUM CITRATE PO Take 1 tablet by mouth daily.    Yes Historical Provider, MD  Cholecalciferol (VITAMIN D3) 2000 UNITS capsule Take 2,000 Units by mouth daily.   Yes Historical Provider, MD  gabapentin (NEURONTIN) 100 MG capsule Take 1 capsule (100 mg total) by mouth 3 (three) times daily. 08/06/14  Yes Bayard Hugger, NP  ibuprofen (ADVIL,MOTRIN) 200 MG tablet Take 1,000-1,200 mg by mouth every 6 (six) hours as needed for pain.    Yes Historical Provider, MD  lovastatin  (MEVACOR) 20 MG tablet Take 20 mg by mouth at bedtime.   Yes Historical Provider, MD  Multiple Vitamin (MULTIVITAMIN) capsule Take 1 capsule by mouth daily. Contains iron   Yes Historical Provider, MD  traMADol (ULTRAM) 50 MG tablet Take 1 tablet (50 mg total) by mouth 5 (five) times daily as needed. 08/06/14  Yes Bayard Hugger, NP  vitamin C (ASCORBIC ACID) 500 MG tablet Take 500 mg by mouth daily.   Yes Historical Provider, MD  budesonide-formoterol (SYMBICORT) 160-4.5 MCG/ACT inhaler Inhale 2 puffs into the lungs 2 (two) times daily.    Historical Provider, MD  furosemide (LASIX) 20 MG tablet Take 20 mg by mouth daily.    Historical Provider, MD    Allergies  Allergen Reactions  . Celebrex [Celecoxib]   . Codeine   . Morphine And Related   . Vioxx [Rofecoxib]     Physical Exam  Vitals  Blood pressure 147/74, pulse 76, temperature 99.7 F (37.6 C), temperature source Oral, resp. rate 15, height '5\' 5"'$  (1.651 m), weight 152 lb (68.947 kg), SpO2 98 %.   General:  In no acute distress, appears healthy and well nourished  Psych:  Normal affect, Denies Suicidal or Homicidal ideations, Awake Alert, Oriented X 3. Speech and thought patterns are clear and appropriate, no apparent short term memory deficits  Neuro:   No focal neurological deficits, CN II through XII intact, Strength 5/5 all 4 extremities, Sensation intact all 4 extremities.  ENT:  Ears and Eyes appear Normal, Conjunctivae clear, PER. Moist oral mucosa without erythema or exudates.  Neck:  Supple, No lymphadenopathy appreciated  Respiratory:  Symmetrical chest wall movement, Good air movement bilaterally, CTAB. Room Air  Cardiac:  RRR, No Murmurs, no LE edema noted, no JVD, No carotid bruits, peripheral pulses palpable at 2+  Abdomen:  Positive bowel sounds, Soft, Non tender, Non distended,  No masses appreciated, no obvious hepatosplenomegaly  Skin:  No Cyanosis, Normal Skin Turgor, No Skin Rash or  Bruise.  Extremities: Symmetrical without obvious trauma or injury,  no effusions.  Data Review  CBC  Recent Labs Lab 09/12/14 0511  WBC 8.1  HGB 11.0*  HCT 34.5*  PLT 390  MCV 98.6  MCH 31.4  MCHC 31.9  RDW 13.2    Chemistries   Recent Labs Lab 09/12/14 0511  NA 139  K 4.1  CL 107  CO2 23  GLUCOSE 127*  BUN 13  CREATININE 0.91  CALCIUM 9.0  AST 30  ALT 19  ALKPHOS 80  BILITOT 0.5    estimated creatinine clearance is 52.1 mL/min (by C-G formula based on Cr of 0.91).  No results for input(s): TSH, T4TOTAL, T3FREE, THYROIDAB in the last 72 hours.  Invalid input(s): FREET3  Coagulation profile No results for input(s): INR, PROTIME in the last 168 hours.  No results for input(s): DDIMER in the last 72 hours.  Cardiac Enzymes No results for input(s): CKMB, TROPONINI, MYOGLOBIN in the last 168 hours.  Invalid input(s): CK  Invalid input(s): POCBNP  Urinalysis    Component Value Date/Time   COLORURINE YELLOW 08/04/2010 0954   APPEARANCEUR CLOUDY* 08/04/2010 0954   LABSPEC 1.021 08/04/2010 0954   PHURINE 6.5 08/04/2010 [place] 08/04/2010 0954   HGBUR NEGATIVE 08/04/2010 0954   BILIRUBINUR NEGATIVE 08/04/2010 0954   KETONESUR NEGATIVE 08/04/2010 0954   PROTEINUR NEGATIVE 08/04/2010 0954   UROBILINOGEN 1.0 08/04/2010 0954   NITRITE NEGATIVE 08/04/2010 0954   LEUKOCYTESUR  08/04/2010 0954    NEGATIVE MICROSCOPIC NOT DONE ON URINES WITH NEGATIVE PROTEIN, BLOOD, LEUKOCYTES, NITRITE, OR GLUCOSE <1000 mg/dL.    Imaging results:   Dg Chest Port 1 View  09/12/2014   CLINICAL DATA:  Chest pain. History of hypertension, cervical cancer and hyperlipidemia.  EXAM: PORTABLE CHEST - 1 VIEW  COMPARISON:  Chest radiograph April 29, 2010  FINDINGS: Cardiac silhouette is normal in appearance. Calcified aortic knob. Mild bronchitic change without pleural effusion or focal consolidation. No pneumothorax. RIGHT  shoulder arthroplasty. Partially  imaged severe degenerative change of the LEFT shoulder.  IMPRESSION: Similar bronchitic changes without focal consolidation.   Electronically Signed   By: Elon Alas   On: 09/12/2014 05:52     EKG: (Independently reviewed) sinus rhythm with old subtle ST segment elevation in the anteroseptal leads unchanged from previous EKG, no acute ischemic changes, QTC 462 ms   Assessment & Plan  Principal Problem:   Chest pain -Admit to telemetry -Currently chest pain-free -Heart score = 7 -Last catheterization was in 2001 and revealed nonobstructive disease involving the PDA -Patient's symptoms for the most part are very typical and concerning for underlying ischemia: Exertional pain relieved with rest and for the most part relieved with oxygen and her nitroglycerin -We'll consult cardiology-unclear if can pursue stress Myoview today or if their recommendation will be for cardiac catheterization and therefore we'll keep nothing by mouth-I have discussed with Dr. Debara Pickett -Beta blocker has been ordered but will hold off on giving until confirmed not undergoing stress Myoview today -Full dose aspirin -Cycle cardiac enzymes -2-D echocardiogram  Active Problems:   HTN /chronic diastolic dysfunction NYHA class I -Current blood pressure moderately controlled -Was not on antihypertensive agents at home but was on Lasix or grade 1 diastolic dysfunction -Currently no issues with dyspnea, orthopnea or lower extremity edema    Tobacco abuse -Counseled on cessation    Dyslipidemia -Continue Mevacor or equivalent therapeutic substitution    CKD (chronic kidney disease), stage II -Renal function stable and at baseline    Chronic low back pain/Sacroiliac dysfunction -Followed at [facility] rehabilitation for pain management -Continue preadmission Ultram -Hold NSAIDs     Anemia -Hemoglobin stable and slightly improved compared to previous baseline of 10.6 -Nothing in the history obtained from the  patient to suggest she is having active GI bleeding but given the fact she does take regular Aleve as precaution we'll stools for blood    DVT Prophylaxis: Subcutaneous heparin  Family Communication:   Son at bedside  Code Status:  Full code  Condition:  Stable  Discharge disposition: Anticipate discharge back to home with husband pending cardiac evaluation; likely within the next 24 hours  Time spent in minutes : 60   ELLIS,ALLISON L. ANP on 09/12/2014 at 8:25 AM  Between 7am to 7pm - Pager - [phone]  After 7pm go to [email or web address] - password [email or web address]  And look for the night coverage person covering me after hours  [facility]

## 2014-09-13 ENCOUNTER — Other Ambulatory Visit: Payer: Self-pay

## 2014-09-13 DIAGNOSIS — I255 Ischemic cardiomyopathy: Secondary | ICD-10-CM

## 2014-09-13 DIAGNOSIS — I213 ST elevation (STEMI) myocardial infarction of unspecified site: Secondary | ICD-10-CM | POA: Diagnosis not present

## 2014-09-13 DIAGNOSIS — M545 Low back pain: Secondary | ICD-10-CM

## 2014-09-13 DIAGNOSIS — I214 Non-ST elevation (NSTEMI) myocardial infarction: Secondary | ICD-10-CM

## 2014-09-13 DIAGNOSIS — F17210 Nicotine dependence, cigarettes, uncomplicated: Secondary | ICD-10-CM | POA: Diagnosis present

## 2014-09-13 DIAGNOSIS — I5032 Chronic diastolic (congestive) heart failure: Secondary | ICD-10-CM | POA: Diagnosis not present

## 2014-09-13 DIAGNOSIS — G8929 Other chronic pain: Secondary | ICD-10-CM | POA: Diagnosis present

## 2014-09-13 DIAGNOSIS — I259 Chronic ischemic heart disease, unspecified: Secondary | ICD-10-CM | POA: Diagnosis present

## 2014-09-13 DIAGNOSIS — Z7982 Long term (current) use of aspirin: Secondary | ICD-10-CM | POA: Diagnosis not present

## 2014-09-13 DIAGNOSIS — I5022 Chronic systolic (congestive) heart failure: Secondary | ICD-10-CM | POA: Diagnosis not present

## 2014-09-13 DIAGNOSIS — D649 Anemia, unspecified: Secondary | ICD-10-CM | POA: Diagnosis present

## 2014-09-13 DIAGNOSIS — Z7951 Long term (current) use of inhaled steroids: Secondary | ICD-10-CM | POA: Diagnosis not present

## 2014-09-13 DIAGNOSIS — E785 Hyperlipidemia, unspecified: Secondary | ICD-10-CM | POA: Diagnosis present

## 2014-09-13 DIAGNOSIS — Z903 Acquired absence of stomach [part of]: Secondary | ICD-10-CM | POA: Diagnosis present

## 2014-09-13 DIAGNOSIS — R079 Chest pain, unspecified: Secondary | ICD-10-CM | POA: Diagnosis present

## 2014-09-13 DIAGNOSIS — I129 Hypertensive chronic kidney disease with stage 1 through stage 4 chronic kidney disease, or unspecified chronic kidney disease: Secondary | ICD-10-CM | POA: Diagnosis present

## 2014-09-13 DIAGNOSIS — Z720 Tobacco use: Secondary | ICD-10-CM | POA: Diagnosis not present

## 2014-09-13 DIAGNOSIS — I25110 Atherosclerotic heart disease of native coronary artery with unstable angina pectoris: Secondary | ICD-10-CM | POA: Diagnosis present

## 2014-09-13 DIAGNOSIS — Z885 Allergy status to narcotic agent status: Secondary | ICD-10-CM | POA: Diagnosis not present

## 2014-09-13 DIAGNOSIS — I10 Essential (primary) hypertension: Secondary | ICD-10-CM | POA: Diagnosis not present

## 2014-09-13 DIAGNOSIS — Z888 Allergy status to other drugs, medicaments and biological substances status: Secondary | ICD-10-CM | POA: Diagnosis not present

## 2014-09-13 DIAGNOSIS — Z8249 Family history of ischemic heart disease and other diseases of the circulatory system: Secondary | ICD-10-CM | POA: Diagnosis not present

## 2014-09-13 DIAGNOSIS — I5042 Chronic combined systolic (congestive) and diastolic (congestive) heart failure: Secondary | ICD-10-CM | POA: Diagnosis present

## 2014-09-13 DIAGNOSIS — N182 Chronic kidney disease, stage 2 (mild): Secondary | ICD-10-CM | POA: Diagnosis present

## 2014-09-13 LAB — CBC
HCT: 36 % (ref 36.0–46.0)
Hemoglobin: 11.5 g/dL — ABNORMAL LOW (ref 12.0–15.0)
MCH: 31.1 pg (ref 26.0–34.0)
MCHC: 31.9 g/dL (ref 30.0–36.0)
MCV: 97.3 fL (ref 78.0–100.0)
Platelets: 355 10*3/uL (ref 150–400)
RBC: 3.7 MIL/uL — ABNORMAL LOW (ref 3.87–5.11)
RDW: 13.1 % (ref 11.5–15.5)
WBC: 5.1 10*3/uL (ref 4.0–10.5)

## 2014-09-13 LAB — PROTIME-INR
INR: 0.93 (ref 0.00–1.49)
Prothrombin Time: 12.6 seconds (ref 11.6–15.2)

## 2014-09-13 LAB — TROPONIN I: Troponin I: 0.09 ng/mL — ABNORMAL HIGH (ref <0.031)

## 2014-09-13 LAB — HEPARIN LEVEL (UNFRACTIONATED)
Heparin Unfractionated: 0.42 IU/mL (ref 0.30–0.70)
Heparin Unfractionated: 0.24 IU/mL — ABNORMAL LOW (ref 0.30–0.70)

## 2014-09-13 MED ORDER — SODIUM CHLORIDE 0.9 % IJ SOLN
3.0000 mL | INTRAMUSCULAR | Status: DC | PRN
Start: 2014-09-13 — End: 2014-09-14

## 2014-09-13 MED ORDER — SODIUM CHLORIDE 0.9 % IV SOLN
3.0000 mL/kg/h | INTRAVENOUS | Status: DC
  Administered 2014-09-14: 3 mL/kg/h via INTRAVENOUS

## 2014-09-13 MED ORDER — ASPIRIN 81 MG PO CHEW
81.0000 mg | CHEWABLE_TABLET | ORAL | Status: AC
  Administered 2014-09-14: 81 mg via ORAL
  Filled 2014-09-13: qty 1

## 2014-09-13 MED ORDER — SODIUM CHLORIDE 0.9 % IJ SOLN
3.0000 mL | Freq: Two times a day (BID) | INTRAMUSCULAR | Status: DC
  Administered 2014-09-13: 3 mL via INTRAVENOUS

## 2014-09-13 MED ORDER — NITROGLYCERIN 2 % TD OINT
0.5000 [in_us] | TOPICAL_OINTMENT | Freq: Three times a day (TID) | TRANSDERMAL | Status: DC
  Administered 2014-09-13 – 2014-09-14 (×3): 0.5 [in_us] via TOPICAL
  Filled 2014-09-13: qty 30

## 2014-09-13 MED ORDER — NITROGLYCERIN 0.4 MG SL SUBL
SUBLINGUAL_TABLET | SUBLINGUAL | Status: AC
  Administered 2014-09-13: 15:00:00
  Filled 2014-09-13: qty 1

## 2014-09-13 MED ORDER — SODIUM CHLORIDE 0.9 % IV SOLN: 1.0000 mL/kg/h | INTRAVENOUS | Status: DC

## 2014-09-13 MED ORDER — SODIUM CHLORIDE 0.9 % IV SOLN
250.0000 mL | INTRAVENOUS | Status: DC | PRN
Start: 2014-09-13 — End: 2014-09-14
  Administered 2014-09-13: 250 mL via INTRAVENOUS

## 2014-09-13 MED ORDER — NITROGLYCERIN 0.4 MG SL SUBL
0.4000 mg | SUBLINGUAL_TABLET | SUBLINGUAL | Status: DC | PRN
Start: 2014-09-13 — End: 2014-09-15
  Administered 2014-09-13: 0.4 mg via SUBLINGUAL

## 2014-09-13 NOTE — Progress Notes (Signed)
[place] for Heparin Indication: chest pain/ACS  Allergies  Allergen Reactions  . Morphine And Related Shortness Of Breath    Throat swelling/trouble breathing and lethargic  . Celebrex [Celecoxib]   . Codeine   . Vioxx [Rofecoxib]     Patient Measurements: Height: '5\' 5"'$  (165.1 cm) Weight: 153 lb 14.1 oz (69.8 kg) IBW/kg (Calculated) : 57  Vital Signs: Temp: 98.3 F (36.8 C) (05/01 0400) Temp Source: Oral (05/01 0400) BP: 154/71 mmHg (05/01 0400) Pulse Rate: 70 (05/01 0400)  Labs:  Recent Labs  09/12/14 0511 09/12/14 1041 09/12/14 1640 09/13/14 0010 09/13/14 0815  HGB 11.0*  --   --   --  11.5*  HCT 34.5*  --   --   --  36.0  PLT 390  --   --   --  355  HEPARINUNFRC  --   --   --   --  0.42  CREATININE 0.91  --   --   --   --   TROPONINI  --  0.03 0.09* 0.09*  --     Estimated Creatinine Clearance: 52.4 mL/min (by C-G formula based on Cr of 0.91).   Medical History: Past Medical History  Diagnosis Date  . Hypertension   . Hyperlipidemia   . Stomach problems   . Cervical cancer   . Back pain   . Neck pain     Medications:  Prescriptions prior to admission  Medication Sig Dispense Refill Last Dose  . aspirin 325 MG tablet Take 325 mg by mouth daily.     09/11/2014 at Unknown time  . CALCIUM CITRATE PO Take 1 tablet by mouth daily.    09/11/2014 at Unknown time  . Cholecalciferol (VITAMIN D3) 2000 UNITS capsule Take 2,000 Units by mouth daily.   09/11/2014 at Unknown time  . gabapentin (NEURONTIN) 100 MG capsule Take 1 capsule (100 mg total) by mouth 3 (three) times daily. 90 capsule 3 09/11/2014 at Unknown time  . ibuprofen (ADVIL,MOTRIN) 200 MG tablet Take 1,000-1,200 mg by mouth every 6 (six) hours as needed for pain.    09/11/2014 at Unknown time  . lovastatin (MEVACOR) 20 MG tablet Take 20 mg by mouth at bedtime.   09/11/2014 at Unknown time  . Multiple Vitamin (MULTIVITAMIN) capsule Take 1 capsule by mouth daily.  Contains iron   09/11/2014 at Unknown time  . traMADol (ULTRAM) 50 MG tablet Take 1 tablet (50 mg total) by mouth 5 (five) times daily as needed. 150 tablet 5 09/11/2014 at Unknown time  . vitamin C (ASCORBIC ACID) 500 MG tablet Take 500 mg by mouth daily.   09/11/2014 at Unknown time  . budesonide-formoterol (SYMBICORT) 160-4.5 MCG/ACT inhaler Inhale 2 puffs into the lungs 2 (two) times daily.   Not Taking at Unknown time  . furosemide (LASIX) 20 MG tablet Take 20 mg by mouth daily.   Taking    Assessment: 74 y.o. female with chest pain and elevated troponin. Stress test with evidence of ischemia, cath Monday. Pharmacy consulted to dose heparin in the meantime. Initial HL is therapeutic at 0.42. H/H low, plt wnl with no reported significant s/s bleeding.    Goal of Therapy:  Heparin level 0.3-0.7 units/ml Monitor platelets by anticoagulation protocol: Yes   Plan:  - Continue heparin gtt '@1000'$  u/hr - 8 hour confirmatory HL - Daily HL/CBC - Monitor for s/s bleeding - F/u LHC on Monday  Finlay Godbee K. Velva Harman, PharmD, BCPS Clinical Pharmacist - Resident Pager:  [phone] Pharmacy: [phone] 09/13/2014 10:02 AM

## 2014-09-13 NOTE — Progress Notes (Signed)
Pt with positive troponin of 0.09. Fredirick Maudlin, NP and Dr. Radford Pax made aware. Will cont to monitor.

## 2014-09-13 NOTE — Progress Notes (Signed)
Pt alerted RN of chest pain/upper back pain 8/10 radiating down left arm.  Pt stated left arm felt "tingly".  Vital signs stable.  Pt asymptomatic.  Nitro administered SL 2x with adequate relief of pain.  EKG performed and 02 2Lnc administered.  Dr. Sallyanne Kuster notified; no new orders received.  Will continue to closely monitor.

## 2014-09-13 NOTE — Progress Notes (Signed)
Patient Name: Sara Fox Date of Encounter: 09/13/2014  Principal Problem:   NSTEMI (non-ST elevated myocardial infarction) Active Problems:   HTN (hypertension)   Chronic low back pain   Sacroiliac dysfunction   Chest pain   Tobacco abuse   Dyslipidemia   CKD (chronic kidney disease), stage II   Anemia   Chronic diastolic heart failure, NYHA class 1   Cardiomyopathy, ischemic   Length of Stay:   SUBJECTIVE   Unfavorable findings on nuclear imaging. The severity of LV dysfunction is out of proportion to the extent of the perfusion abnormality. Consider gating error versus multivessel CAD with relatively balanced ischemia. Recommend cardiac cath, especially with borderline increase in troponin I.   CURRENT MEDS . aspirin  325 mg Oral Daily  . gabapentin  100 mg Oral TID  . metoprolol tartrate  25 mg Oral BID WC  . multivitamin  1 capsule Oral Daily  . pravastatin  20 mg Oral q1800    OBJECTIVE   Intake/Output Summary (Last 24 hours) at 09/13/14 0939 Last data filed at 09/13/14 0800  Gross per 24 hour  Intake 447.33 ml  Output   1050 ml  Net -602.67 ml   Filed Weights   09/12/14 0850 09/13/14 0000 09/13/14 0400  Weight: 154 lb 12.2 oz (70.2 kg) 153 lb 14.1 oz (69.8 kg) 153 lb 14.1 oz (69.8 kg)    PHYSICAL EXAM Filed Vitals:   09/12/14 1501 09/12/14 2000 09/13/14 0000 09/13/14 0400  BP: 158/73 139/64 129/92 154/71  Pulse: 75 74 68 70  Temp:  98.7 F (37.1 C) 98 F (36.7 C) 98.3 F (36.8 C)  TempSrc:  Oral Oral Oral  Resp:  '18 18 18  '$ Height:      Weight:   153 lb 14.1 oz (69.8 kg) 153 lb 14.1 oz (69.8 kg)  SpO2: 99% 98% 96% 96%   General: Alert, oriented x3, no distress Head: no evidence of trauma, PERRL, EOMI, no exophtalmos or lid lag, no myxedema, no xanthelasma; normal ears, nose and oropharynx Neck: normal jugular venous pulsations and no hepatojugular reflux; brisk carotid pulses without delay and no carotid bruits Chest: clear to auscultation,  no signs of consolidation by percussion or palpation, normal fremitus, symmetrical and full respiratory excursions Cardiovascular: normal position and quality of the apical impulse, regular rhythm, normal first heart sound and normal second heart sound, no rubs or gallops, 2/6 early peaking aortic ejection murmur Abdomen: no tenderness or distention, no masses by palpation, no abnormal pulsatility or arterial bruits, normal bowel sounds, no hepatosplenomegaly Extremities: no clubbing, cyanosis; 1+ symmetrcal ankle edema; 2+ radial, ulnar and brachial pulses bilaterally; 2+ right femoral, posterior tibial and dorsalis pedis pulses; 2+ left femoral, posterior tibial and dorsalis pedis pulses; no subclavian or femoral bruits Neurological: grossly nonfocal  LABS  CBC  Recent Labs  09/12/14 0511  WBC 8.1  HGB 11.0*  HCT 34.5*  MCV 98.6  PLT 893   Basic Metabolic Panel  Recent Labs  09/12/14 0511  NA 139  K 4.1  CL 107  CO2 23  GLUCOSE 127*  BUN 13  CREATININE 0.91  CALCIUM 9.0   Liver Function Tests  Recent Labs  09/12/14 0511  AST 30  ALT 19  ALKPHOS 80  BILITOT 0.5  PROT 6.3  ALBUMIN 3.3*    Recent Labs  09/12/14 0511  LIPASE 21   Cardiac Enzymes  Recent Labs  09/12/14 1041 09/12/14 1640 09/13/14 0010  TROPONINI 0.03 0.09* 0.09*  Recent Labs  09/12/14 1041  CHOL 158  HDL 45  LDLCALC 101*  TRIG 61  CHOLHDL 3.5  Radiology Studies Imaging results have been reviewed and Nm Myocar Multi W/spect W/wall Motion / Ef  09/12/2014   CLINICAL DATA:  Chest pain.  Hypertension.  Hyperlipidemia.  EXAM: MYOCARDIAL IMAGING WITH SPECT (REST AND PHARMACOLOGIC-STRESS)  GATED LEFT VENTRICULAR WALL MOTION STUDY  LEFT VENTRICULAR EJECTION FRACTION  TECHNIQUE: Standard myocardial SPECT imaging was performed after resting intravenous injection of 10 mCi Tc-45msestamibi. Subsequently, intravenous infusion of Lexiscan was performed under the supervision of the Cardiology  staff. At peak effect of the drug, 30 mCi Tc-966mestamibi was injected intravenously and standard myocardial SPECT imaging was performed. Quantitative gated imaging was also performed to evaluate left ventricular wall motion, and estimate left ventricular ejection fraction.  COMPARISON:  None.  FINDINGS: Perfusion: Stress images show an inferior wall defect which is of medium-sized and mild severity, and shows reversibility on resting images. This is suspicious for an area of a inducible myocardial ischemia.  Wall Motion: Global left ventricular hypokinesis is seen. Moderate left ventricular dilatation noted.  Left Ventricular Ejection Fraction: 32 %  End diastolic volume 12886l  End systolic volume 88 ml  IMPRESSION: 1. Mild medium-size reversible myocardial defect in the inferior wall, suspicious for an distal myocardial ischemia.  2. Global left ventricular hypokinesis and moderate left ventricular dilatation.  3. Left ventricular ejection fraction 32%  4. High-risk stress test findings*.  *2012 Appropriate Use Criteria for Coronary Revascularization Focused Update: J Am Coll Cardiol. 207737;36(6):815-947http://[email or web address]?articleid=1201161   Electronically Signed   By: JoEarle Gell.D.   On: 09/12/2014 15:51   Dg Chest Port 1 View  09/12/2014   CLINICAL DATA:  Chest pain. History of hypertension, cervical cancer and hyperlipidemia.  EXAM: PORTABLE CHEST - 1 VIEW  COMPARISON:  Chest radiograph April 29, 2010  FINDINGS: Cardiac silhouette is normal in appearance. Calcified aortic knob. Mild bronchitic change without pleural effusion or focal consolidation. No pneumothorax. RIGHT shoulder arthroplasty. Partially imaged severe degenerative change of the LEFT shoulder.  IMPRESSION: Similar bronchitic changes without focal consolidation.   Electronically Signed   By: CoElon Alas On: 09/12/2014 05:52    TELE NSR   ASSESSMENT AND PLAN  Presentation with unstable again,  mildly abnormal troponin and high risk nuclear scan - possibly even a small NSTEMI. Heparin IV started. Plan cardiac cath in AM, possible PCI-stent. Note remote history of "false positive" nuclear scan leading to cath with minor CAD in 2001. Suspect this time it is more serious. This procedure has been fully reviewed with the patient and written informed consent has been obtained.    MiSanda KleinMD, FALubbock Heart HospitalH[facility] ([phone] ([phone] 09/13/2014 9:39 AM

## 2014-09-13 NOTE — Progress Notes (Signed)
[facility] for Heparin Indication: chest pain/ACS  Allergies  Allergen Reactions  . Morphine And Related Shortness Of Breath    Throat swelling/trouble breathing and lethargic  . Celebrex [Celecoxib]   . Codeine   . Vioxx [Rofecoxib]     Patient Measurements: Height: '5\' 5"'$  (165.1 cm) Weight: 153 lb 14.1 oz (69.8 kg) IBW/kg (Calculated) : 57  Vital Signs: Temp: 98.3 F (36.8 C) (05/01 1727) Temp Source: Oral (05/01 1727) BP: 166/96 mmHg (05/01 1727) Pulse Rate: 73 (05/01 1727)  Labs:  Recent Labs  09/12/14 0511 09/12/14 1041 09/12/14 1640 09/13/14 0010 09/13/14 0815 09/13/14 1632  HGB 11.0*  --   --   --  11.5*  --   HCT 34.5*  --   --   --  36.0  --   PLT 390  --   --   --  355  --   HEPARINUNFRC  --   --   --   --  0.42 0.24*  CREATININE 0.91  --   --   --   --   --   TROPONINI  --  0.03 0.09* 0.09*  --   --     Estimated Creatinine Clearance: 52.4 mL/min (by C-G formula based on Cr of 0.91).    Assessment: 74 y.o. female with chest pain and elevated troponin. Stress test with evidence of ischemia, cath Monday. Pharmacy consulted to dose heparin in the meantime. Initial HL is therapeutic at 0.42. H/H low, plt wnl with no reported significant s/s bleeding.   2nd level low at 0.24  Goal of Therapy:  Heparin level 0.3-0.7 units/ml Monitor platelets by anticoagulation protocol: Yes   Plan:  - Heparin to 1200 units / hr - Daily HL/CBC - Monitor for s/s bleeding - F/u LHC on Monday  Thank you. Anette Guarneri, PharmD [phone]  09/13/2014 5:45 PM

## 2014-09-13 NOTE — Progress Notes (Signed)
  Echocardiogram 2D Echocardiogram has been performed.  Donata Clay 09/13/2014, 1:27 PM

## 2014-09-13 NOTE — Progress Notes (Signed)
PATIENT DETAILS Name: Sara Fox Age: 74 y.o. Sex: female Date of Birth: [birth year] Admit Date: 09/12/2014 Admitting Physician Annita Brod, MD RCV:[id number],BPZWC N, MD  Subjective: No further chest pain this morning. Had 2 episodes of chest pain that this afternoon relieved by nitroglycerin.  Assessment/Plan: Principal Problem:   NSTEMI (non-ST elevated myocardial infarction): Admitted with chest pain, troponins were mildly elevated, cardiology consulted, underwent nuclear stress  test which shows  ischemia. Started on IV heparin, aspirin, beta blocker and statin. Scheduled for cardiac catheterization tomorrow morning.  Active Problems:   Chronic systolic heart failure: Clinically compensated. EF per nuclear stress test 32%. Await echocardiogram and cardiac catheterization. Will add ACE inhibitor next few days.    HTN (hypertension): Controlled, continue with metoprolol    Chronic low back pain: Continue Ultram    Dyslipidemia: Continue statin.     Tobacco abuse:Counseled on cessation   Disposition: Remain inpatient  Antimicrobial agents  See below  Anti-infectives    None      DVT Prophylaxis: Heparin infusion  Code Status: Full code   Family Communication None at bedside  Procedures: None  CONSULTS:  cardiology  MEDICATIONS: Scheduled Meds: . [START ON 09/14/2014] aspirin  81 mg Oral Pre-Cath  . aspirin  325 mg Oral Daily  . gabapentin  100 mg Oral TID  . metoprolol tartrate  25 mg Oral BID WC  . multivitamin  1 capsule Oral Daily  . nitroGLYCERIN  0.5 inch Topical 3 times per day  . pravastatin  20 mg Oral q1800  . sodium chloride  3 mL Intravenous Q12H   Continuous Infusions: . [START ON 09/14/2014] sodium chloride     Followed by  . [START ON 09/14/2014] sodium chloride    . heparin 1,000 Units/hr (09/12/14 2316)   PRN Meds:.sodium chloride, acetaminophen, gi cocktail, nitroGLYCERIN, ondansetron (ZOFRAN) IV, sodium  chloride, traMADol    PHYSICAL EXAM: Vital signs in last 24 hours: Filed Vitals:   09/13/14 0400 09/13/14 1458 09/13/14 1505 09/13/14 1510  BP: 154/71 161/95 141/82 138/79  Pulse: 70 77 78 78  Temp: 98.3 F (36.8 C)     TempSrc: Oral     Resp: 18     Height:      Weight: 69.8 kg (153 lb 14.1 oz)     SpO2: 96% 100% 99% 100%    Weight change: 1.253 kg (2 lb 12.2 oz) Filed Weights   09/12/14 0850 09/13/14 0000 09/13/14 0400  Weight: 70.2 kg (154 lb 12.2 oz) 69.8 kg (153 lb 14.1 oz) 69.8 kg (153 lb 14.1 oz)   Body mass index is 25.61 kg/(m^2).   Gen Exam: Awake and alert with clear speech.   Neck: Supple, No JVD.   Chest: B/L Clear.   CVS: S1 S2 Regular, no murmurs.  Abdomen: soft, BS +, non tender, non distended.  Extremities: no edema, lower extremities warm to touch. Neurologic: Non Focal.   Skin: No Rash.   Wounds: N/A.   Intake/Output from previous day:  Intake/Output Summary (Last 24 hours) at 09/13/14 1706 Last data filed at 09/13/14 1600  Gross per 24 hour  Intake 287.33 ml  Output   1250 ml  Net -962.67 ml     LAB RESULTS: CBC  Recent Labs Lab 09/12/14 0511 09/13/14 0815  WBC 8.1 5.1  HGB 11.0* 11.5*  HCT 34.5* 36.0  PLT 390 355  MCV 98.6 97.3  MCH  31.4 31.1  MCHC 31.9 31.9  RDW 13.2 13.1    Chemistries   Recent Labs Lab 09/12/14 0511  NA 139  K 4.1  CL 107  CO2 23  GLUCOSE 127*  BUN 13  CREATININE 0.91  CALCIUM 9.0    CBG: No results for input(s): GLUCAP in the last 168 hours.  GFR Estimated Creatinine Clearance: 52.4 mL/min (by C-G formula based on Cr of 0.91).  Coagulation profile No results for input(s): INR, PROTIME in the last 168 hours.  Cardiac Enzymes  Recent Labs Lab 09/12/14 1041 09/12/14 1640 09/13/14 0010  TROPONINI 0.03 0.09* 0.09*    Invalid input(s): POCBNP No results for input(s): DDIMER in the last 72 hours. No results for input(s): HGBA1C in the last 72 hours.  Recent Labs  09/12/14 1041    CHOL 158  HDL 45  LDLCALC 101*  TRIG 61  CHOLHDL 3.5   No results for input(s): TSH, T4TOTAL, T3FREE, THYROIDAB in the last 72 hours.  Invalid input(s): FREET3 No results for input(s): VITAMINB12, FOLATE, FERRITIN, TIBC, IRON, RETICCTPCT in the last 72 hours.  Recent Labs  09/12/14 0511  LIPASE 21    Urine Studies No results for input(s): UHGB, CRYS in the last 72 hours.  Invalid input(s): UACOL, UAPR, USPG, UPH, UTP, UGL, UKET, UBIL, UNIT, UROB, ULEU, UEPI, UWBC, URBC, UBAC, CAST, UCOM, BILUA  MICROBIOLOGY: No results found for this or any previous visit (from the past 240 hour(s)).  RADIOLOGY STUDIES/RESULTS: Nm Myocar Multi W/spect W/wall Motion / Ef  09/12/2014   CLINICAL DATA:  Chest pain.  Hypertension.  Hyperlipidemia.  EXAM: MYOCARDIAL IMAGING WITH SPECT (REST AND PHARMACOLOGIC-STRESS)  GATED LEFT VENTRICULAR WALL MOTION STUDY  LEFT VENTRICULAR EJECTION FRACTION  TECHNIQUE: Standard myocardial SPECT imaging was performed after resting intravenous injection of 10 mCi Tc-20msestamibi. Subsequently, intravenous infusion of Lexiscan was performed under the supervision of the Cardiology staff. At peak effect of the drug, 30 mCi Tc-959mestamibi was injected intravenously and standard myocardial SPECT imaging was performed. Quantitative gated imaging was also performed to evaluate left ventricular wall motion, and estimate left ventricular ejection fraction.  COMPARISON:  None.  FINDINGS: Perfusion: Stress images show an inferior wall defect which is of medium-sized and mild severity, and shows reversibility on resting images. This is suspicious for an area of a inducible myocardial ischemia.  Wall Motion: Global left ventricular hypokinesis is seen. Moderate left ventricular dilatation noted.  Left Ventricular Ejection Fraction: 32 %  End diastolic volume 12732l  End systolic volume 88 ml  IMPRESSION: 1. Mild medium-size reversible myocardial defect in the inferior wall, suspicious  for an distal myocardial ischemia.  2. Global left ventricular hypokinesis and moderate left ventricular dilatation.  3. Left ventricular ejection fraction 32%  4. High-risk stress test findings*.  *2012 Appropriate Use Criteria for Coronary Revascularization Focused Update: J Am Coll Cardiol. 202025;42(7):062-376http://content.[email or web address]?[email or web address]   Electronically Signed   By: JoEarle Gell.D.   On: 09/12/2014 15:51   Dg Chest Port 1 View  09/12/2014   CLINICAL DATA:  Chest pain. History of hypertension, cervical cancer and hyperlipidemia.  EXAM: PORTABLE CHEST - 1 VIEW  COMPARISON:  Chest radiograph April 29, 2010  FINDINGS: Cardiac silhouette is normal in appearance. Calcified aortic knob. Mild bronchitic change without pleural effusion or focal consolidation. No pneumothorax. RIGHT shoulder arthroplasty. Partially imaged severe degenerative change of the LEFT shoulder.  IMPRESSION: Similar bronchitic changes without focal consolidation.   Electronically Signed   By: CoSandie Ano  Bloomer   On: 09/12/2014 05:52    Oren Binet, MD  [facility] Pager:[phone]  If 7PM-7AM, please contact night-coverage [email or web address] Password [email or web address] 09/13/2014, 5:06 PM

## 2014-09-14 ENCOUNTER — Encounter (HOSPITAL_COMMUNITY)
Admission: EM | Disposition: A | Discharge status: Home / Self Care | Payer: Medicare Other | Source: Home / Self Care | Attending: Internal Medicine

## 2014-09-14 DIAGNOSIS — I255 Ischemic cardiomyopathy: Secondary | ICD-10-CM

## 2014-09-14 DIAGNOSIS — I25110 Atherosclerotic heart disease of native coronary artery with unstable angina pectoris: Secondary | ICD-10-CM

## 2014-09-14 DIAGNOSIS — I10 Essential (primary) hypertension: Secondary | ICD-10-CM

## 2014-09-14 HISTORY — PX: CARDIAC CATHETERIZATION: SHX172

## 2014-09-14 HISTORY — PX: PERCUTANEOUS CORONARY STENT INTERVENTION (PCI-S): SHX5485

## 2014-09-14 LAB — CBC
HCT: 33.6 % — ABNORMAL LOW (ref 36.0–46.0)
Hemoglobin: 10.8 g/dL — ABNORMAL LOW (ref 12.0–15.0)
MCH: 31 pg (ref 26.0–34.0)
MCHC: 32.1 g/dL (ref 30.0–36.0)
MCV: 96.6 fL (ref 78.0–100.0)
Platelets: 335 10*3/uL (ref 150–400)
RBC: 3.48 MIL/uL — ABNORMAL LOW (ref 3.87–5.11)
RDW: 13.1 % (ref 11.5–15.5)
WBC: 6.3 10*3/uL (ref 4.0–10.5)

## 2014-09-14 LAB — BASIC METABOLIC PANEL
Anion gap: 7 (ref 5–15)
BUN: 9 mg/dL (ref 6–20)
CO2: 26 mmol/L (ref 22–32)
Calcium: 8.8 mg/dL — ABNORMAL LOW (ref 8.9–10.3)
Chloride: 110 mmol/L (ref 101–111)
Creatinine, Ser: 0.67 mg/dL (ref 0.44–1.00)
GFR calc Af Amer: >60 mL/min (ref >60)
GFR calc non Af Amer: >60 mL/min (ref >60)
Glucose, Bld: 95 mg/dL (ref 70–99)
Potassium: 3.2 mmol/L — ABNORMAL LOW (ref 3.5–5.1)
Sodium: 143 mmol/L (ref 135–145)

## 2014-09-14 LAB — HEPARIN LEVEL (UNFRACTIONATED): Heparin Unfractionated: 0.31 IU/mL (ref 0.30–0.70)

## 2014-09-14 LAB — POCT ACTIVATED CLOTTING TIME: Activated Clotting Time: 386 seconds

## 2014-09-14 SURGERY — LEFT HEART CATH AND CORONARY ANGIOGRAPHY
Anesthesia: LOCAL

## 2014-09-14 MED ORDER — SODIUM CHLORIDE 0.9 % IJ SOLN: 3.0000 mL | INTRAMUSCULAR | Status: DC | PRN

## 2014-09-14 MED ORDER — LIDOCAINE HCL (PF) 1 % IJ SOLN
INTRAMUSCULAR | Status: AC
  Filled 2014-09-14: qty 30

## 2014-09-14 MED ORDER — ASPIRIN 81 MG PO CHEW
81.0000 mg | CHEWABLE_TABLET | Freq: Every day | ORAL | Status: DC
  Administered 2014-09-15: 09:00:00 81 mg via ORAL
  Filled 2014-09-14: qty 1

## 2014-09-14 MED ORDER — TICAGRELOR 90 MG PO TABS
90.0000 mg | ORAL_TABLET | Freq: Two times a day (BID) | ORAL | Status: DC
  Administered 2014-09-14 – 2014-09-15 (×2): 90 mg via ORAL
  Filled 2014-09-14 (×3): qty 1

## 2014-09-14 MED ORDER — NITROGLYCERIN IN D5W 200-5 MCG/ML-% IV SOLN
INTRAVENOUS | Status: DC | PRN
  Administered 2014-09-14: 10 ug/min via INTRAVENOUS

## 2014-09-14 MED ORDER — ACTIVE PARTNERSHIP FOR HEALTH OF YOUR HEART BOOK
Freq: Once | Status: AC
  Administered 2014-09-14: 20:00:00
  Filled 2014-09-14: qty 1

## 2014-09-14 MED ORDER — SODIUM CHLORIDE 0.9 % WEIGHT BASED INFUSION: 3.0000 mL/kg/h | INTRAVENOUS | Status: AC

## 2014-09-14 MED ORDER — MIDAZOLAM HCL 2 MG/2ML IJ SOLN
INTRAMUSCULAR | Status: AC
  Filled 2014-09-14: qty 2

## 2014-09-14 MED ORDER — NITROGLYCERIN 1 MG/10 ML FOR IR/CATH LAB
INTRA_ARTERIAL | Status: AC
  Filled 2014-09-14: qty 10

## 2014-09-14 MED ORDER — NITROGLYCERIN 0.4 MG/SPRAY TL SOLN
Status: DC | PRN
  Administered 2014-09-14: 1 via SUBLINGUAL

## 2014-09-14 MED ORDER — ADULT MULTIVITAMIN W/MINERALS CH: 1.0000 | ORAL_TABLET | Freq: Every day | ORAL | Status: DC

## 2014-09-14 MED ORDER — FENTANYL CITRATE (PF) 100 MCG/2ML IJ SOLN
INTRAMUSCULAR | Status: AC
  Filled 2014-09-14: qty 2

## 2014-09-14 MED ORDER — SODIUM CHLORIDE 0.9 % IJ SOLN
3.0000 mL | Freq: Two times a day (BID) | INTRAMUSCULAR | Status: DC
  Administered 2014-09-14: 18:00:00 3 mL via INTRAVENOUS

## 2014-09-14 MED ORDER — BIVALIRUDIN BOLUS VIA INFUSION
INTRAVENOUS | Status: DC | PRN
  Administered 2014-09-14: 6.78 mg via INTRAVENOUS

## 2014-09-14 MED ORDER — METOPROLOL TARTRATE 25 MG PO TABS
50.0000 mg | ORAL_TABLET | Freq: Two times a day (BID) | ORAL | Status: DC
  Administered 2014-09-14 – 2014-09-15 (×2): 50 mg via ORAL
  Filled 2014-09-14 (×2): qty 2

## 2014-09-14 MED ORDER — BIVALIRUDIN 250 MG IV SOLR
INTRAVENOUS | Status: AC
  Filled 2014-09-14: qty 250

## 2014-09-14 MED ORDER — POTASSIUM CHLORIDE 20 MEQ/15ML (10%) PO SOLN
15.0000 meq | Freq: Every day | ORAL | Status: DC
  Administered 2014-09-15: 15 meq via ORAL
  Filled 2014-09-14: qty 15

## 2014-09-14 MED ORDER — HEPARIN SODIUM (PORCINE) 1000 UNIT/ML IJ SOLN
INTRAMUSCULAR | Status: AC
  Filled 2014-09-14: qty 1

## 2014-09-14 MED ORDER — POTASSIUM CHLORIDE 10 MEQ/100ML IV SOLN: 10.0000 meq | INTRAVENOUS | Status: DC

## 2014-09-14 MED ORDER — TICAGRELOR 90 MG PO TABS
ORAL_TABLET | ORAL | Status: DC | PRN
  Administered 2014-09-14: 180 mg via ORAL

## 2014-09-14 MED ORDER — HYDRALAZINE HCL 20 MG/ML IJ SOLN
10.0000 mg | Freq: Once | INTRAMUSCULAR | Status: AC | PRN
  Filled 2014-09-14: qty 1

## 2014-09-14 MED ORDER — NITROGLYCERIN 0.2 MG/ML ON CALL CATH LAB
INTRAVENOUS | Status: DC | PRN
  Administered 2014-09-14: 200 ug via INTRAVENOUS

## 2014-09-14 MED ORDER — NITROGLYCERIN IN D5W 200-5 MCG/ML-% IV SOLN
INTRAVENOUS | Status: AC
  Filled 2014-09-14: qty 250

## 2014-09-14 MED ORDER — FENTANYL CITRATE (PF) 100 MCG/2ML IJ SOLN
INTRAMUSCULAR | Status: DC | PRN
  Administered 2014-09-14: 12.5 ug via INTRAVENOUS
  Administered 2014-09-14 (×2): 25 ug via INTRAVENOUS

## 2014-09-14 MED ORDER — SODIUM CHLORIDE 0.9 % IV SOLN: 250.0000 mL | INTRAVENOUS | Status: DC | PRN

## 2014-09-14 MED ORDER — VERAPAMIL HCL 2.5 MG/ML IV SOLN
INTRAVENOUS | Status: DC | PRN
  Administered 2014-09-14: 13:00:00 via INTRA_ARTERIAL

## 2014-09-14 MED ORDER — LABETALOL HCL 5 MG/ML IV SOLN
INTRAVENOUS | Status: AC
  Filled 2014-09-14: qty 4

## 2014-09-14 MED ORDER — IOHEXOL 350 MG/ML SOLN
INTRAVENOUS | Status: DC | PRN
  Administered 2014-09-14: 125 mL via INTRAVENOUS
  Administered 2014-09-14: 100 mL via INTRAVENOUS

## 2014-09-14 MED ORDER — MIDAZOLAM HCL 2 MG/2ML IJ SOLN
INTRAMUSCULAR | Status: DC | PRN
  Administered 2014-09-14 (×2): 0.5 mg via INTRAVENOUS

## 2014-09-14 MED ORDER — HEPARIN (PORCINE) IN NACL 2-0.9 UNIT/ML-% IJ SOLN
INTRAMUSCULAR | Status: AC
  Filled 2014-09-14: qty 1000

## 2014-09-14 MED ORDER — POTASSIUM CHLORIDE CRYS ER 20 MEQ PO TBCR
40.0000 meq | EXTENDED_RELEASE_TABLET | Freq: Once | ORAL | Status: AC
  Administered 2014-09-14: 40 meq via ORAL
  Filled 2014-09-14: qty 2

## 2014-09-14 MED ORDER — LABETALOL HCL 5 MG/ML IV SOLN
INTRAVENOUS | Status: DC | PRN
  Administered 2014-09-14: 10 mg via INTRAVENOUS

## 2014-09-14 MED ORDER — VERAPAMIL HCL 2.5 MG/ML IV SOLN
INTRAVENOUS | Status: AC
  Filled 2014-09-14: qty 2

## 2014-09-14 MED ORDER — ADULT MULTIVITAMIN W/MINERALS CH
1.0000 | ORAL_TABLET | Freq: Every day | ORAL | Status: DC
  Administered 2014-09-14 – 2014-09-15 (×2): 1 via ORAL
  Filled 2014-09-14 (×2): qty 1

## 2014-09-14 MED ORDER — NITROGLYCERIN IN D5W 200-5 MCG/ML-% IV SOLN: 0.0000 ug/min | INTRAVENOUS | Status: DC

## 2014-09-14 MED ORDER — TICAGRELOR 90 MG PO TABS
ORAL_TABLET | ORAL | Status: AC
  Filled 2014-09-14: qty 2

## 2014-09-14 MED ORDER — SODIUM CHLORIDE 0.9 % IV SOLN
250.0000 mg | INTRAVENOUS | Status: DC | PRN
  Administered 2014-09-14: 1 mg/kg/h via INTRAVENOUS

## 2014-09-14 MED ORDER — NITROGLYCERIN IN D5W 200-5 MCG/ML-% IV SOLN
INTRAVENOUS | Status: DC | PRN
  Administered 2014-09-14: 20 ug/min via INTRAVENOUS

## 2014-09-14 MED ORDER — HEPARIN SODIUM (PORCINE) 1000 UNIT/ML IJ SOLN
INTRAMUSCULAR | Status: DC | PRN
  Administered 2014-09-14: 3500 [IU] via INTRAVENOUS

## 2014-09-14 SURGICAL SUPPLY — 21 items
BALLN EUPHORA RX 2.5X12 (BALLOONS) ×3
BALLN ~~LOC~~ EUPHORA RX 3.25X20 (BALLOONS) ×3
BALLOON EUPHORA RX 2.5X12 (BALLOONS) IMPLANT
BALLOON ~~LOC~~ EUPHORA RX 3.25X20 (BALLOONS) IMPLANT
CATH INFINITI 5FR ANG PIGTAIL (CATHETERS) ×3 IMPLANT
CATH INFINITI 5FR MULTPACK ANG (CATHETERS) IMPLANT
CATH OPTITORQUE TIG 4.0 5F (CATHETERS) ×3 IMPLANT
DEVICE RAD COMP TR BAND LRG (VASCULAR PRODUCTS) ×3 IMPLANT
GLIDESHEATH SLEND A-KIT 6F 22G (SHEATH) ×3 IMPLANT
GUIDE CATH RUNWAY 6FR FR4 (CATHETERS) ×1 IMPLANT
KIT ENCORE 26 ADVANTAGE (KITS) ×1 IMPLANT
KIT HEART LEFT (KITS) ×3 IMPLANT
PACK CARDIAC CATHETERIZATION (CUSTOM PROCEDURE TRAY) ×3 IMPLANT
SHEATH PINNACLE 5F 10CM (SHEATH) IMPLANT
STENT XIENCE ALPINE RX 3.0X33 (Permanent Stent) ×1 IMPLANT
SYR MEDRAD MARK V 150ML (SYRINGE) ×3 IMPLANT
TRANSDUCER W/STOPCOCK (MISCELLANEOUS) ×3 IMPLANT
TUBING CIL FLEX 10 FLL-RA (TUBING) ×3 IMPLANT
WIRE EMERALD 3MM-J .035X150CM (WIRE) IMPLANT
WIRE HI TORQ BMW 190CM (WIRE) ×2 IMPLANT
WIRE SAFE-T 1.5MM-J .035X260CM (WIRE) ×3 IMPLANT

## 2014-09-14 NOTE — H&P (View-Only) (Signed)
Patient Name: Sara Fox Date of Encounter: 09/14/2014  Principal Problem:   NSTEMI (non-ST elevated myocardial infarction) Active Problems:   HTN (hypertension)   Chronic low back pain   Sacroiliac dysfunction   Chest pain   Tobacco abuse   Dyslipidemia   CKD (chronic kidney disease), stage II   Anemia   Chronic diastolic heart failure, NYHA class 1   Cardiomyopathy, ischemic   Chronic systolic heart failure   Length of Stay: 1  SUBJECTIVE  Left shoulder pain radiating down arm yesterday. Resolved 5' after SL NTG. Did not tolerate topical NTG due to headache.  CURRENT MEDS . aspirin  325 mg Oral Daily  . gabapentin  100 mg Oral TID  . metoprolol tartrate  25 mg Oral BID WC  . multivitamin with minerals  1 tablet Oral Daily  . nitroGLYCERIN  0.5 inch Topical 3 times per day  . potassium chloride  15 mEq Oral Daily  . pravastatin  20 mg Oral q1800  . sodium chloride  3 mL Intravenous Q12H    OBJECTIVE   Intake/Output Summary (Last 24 hours) at 09/14/14 0909 Last data filed at 09/13/14 2200  Gross per 24 hour  Intake 194.33 ml  Output    800 ml  Net -605.67 ml   Filed Weights   09/13/14 0000 09/13/14 0400 09/14/14 0503  Weight: 153 lb 14.1 oz (69.8 kg) 153 lb 14.1 oz (69.8 kg) 149 lb 8 oz (67.813 kg)    PHYSICAL EXAM Filed Vitals:   09/13/14 1510 09/13/14 1727 09/13/14 2010 09/14/14 0503  BP: 138/79 166/96 163/84 151/58  Pulse: 78 73 69 73  Temp:  98.3 F (36.8 C) 98.7 F (37.1 C) 98.6 F (37 C)  TempSrc:  Oral Oral Oral  Resp:      Height:      Weight:    149 lb 8 oz (67.813 kg)  SpO2: 100% 98% 100% 100%   General: Alert, oriented x3, no distress Head: no evidence of trauma, PERRL, EOMI, no exophtalmos or lid lag, no myxedema, no xanthelasma; normal ears, nose and oropharynx Neck: normal jugular venous pulsations and no hepatojugular reflux; brisk carotid pulses without delay and no carotid bruits Chest: clear to auscultation, no signs of  consolidation by percussion or palpation, normal fremitus, symmetrical and full respiratory excursions Cardiovascular: normal position and quality of the apical impulse, regular rhythm, normal first heart sound and normal second heart sound, no rubs or gallops, 2/6 early peaking aortic ejection murmur Abdomen: no tenderness or distention, no masses by palpation, no abnormal pulsatility or arterial bruits, normal bowel sounds, no hepatosplenomegaly Extremities: no clubbing, cyanosis; 1+ symmetrcal ankle edema; 2+ radial, ulnar and brachial pulses bilaterally; 2+ right femoral, posterior tibial and dorsalis pedis pulses; 2+ left femoral, posterior tibial and dorsalis pedis pulses; no subclavian or femoral bruits Neurological: grossly nonfocal  LABS  CBC  Recent Labs  09/13/14 0815 09/14/14 0333  WBC 5.1 6.3  HGB 11.5* 10.8*  HCT 36.0 33.6*  MCV 97.3 96.6  PLT 355 944   Basic Metabolic Panel  Recent Labs  09/12/14 0511 09/14/14 0333  NA 139 143  K 4.1 3.2*  CL 107 110  CO2 23 26  GLUCOSE 127* 95  BUN 13 9  CREATININE 0.91 0.67  CALCIUM 9.0 8.8*   Liver Function Tests  Recent Labs  09/12/14 0511  AST 30  ALT 19  ALKPHOS 80  BILITOT 0.5  PROT 6.3  ALBUMIN 3.3*    Recent Labs  09/12/14 0511  LIPASE 21   Cardiac Enzymes  Recent Labs  09/12/14 1041 09/12/14 1640 09/13/14 0010  TROPONINI 0.03 0.09* 0.09*     Recent Labs  09/12/14 1041  CHOL 158  HDL 45  LDLCALC 101*  TRIG 61  CHOLHDL 3.5   Thyroid Function Tests No results for input(s): TSH, T4TOTAL, T3FREE, THYROIDAB in the last 72 hours.  Invalid input(s): [place]  Radiology Studies Imaging results have been reviewed and Nm Myocar Multi W/spect W/wall Motion / Ef  09/12/2014   CLINICAL DATA:  Chest pain.  Hypertension.  Hyperlipidemia.  EXAM: MYOCARDIAL IMAGING WITH SPECT (REST AND PHARMACOLOGIC-STRESS)  GATED LEFT VENTRICULAR WALL MOTION STUDY  LEFT VENTRICULAR EJECTION FRACTION  TECHNIQUE:  Standard myocardial SPECT imaging was performed after resting intravenous injection of 10 mCi Tc-74msestamibi. Subsequently, intravenous infusion of Lexiscan was performed under the supervision of the Cardiology staff. At peak effect of the drug, 30 mCi Tc-950mestamibi was injected intravenously and standard myocardial SPECT imaging was performed. Quantitative gated imaging was also performed to evaluate left ventricular wall motion, and estimate left ventricular ejection fraction.  COMPARISON:  None.  FINDINGS: Perfusion: Stress images show an inferior wall defect which is of medium-sized and mild severity, and shows reversibility on resting images. This is suspicious for an area of a inducible myocardial ischemia.  Wall Motion: Global left ventricular hypokinesis is seen. Moderate left ventricular dilatation noted.  Left Ventricular Ejection Fraction: 32 %  End diastolic volume 12579l  End systolic volume 88 ml  IMPRESSION: 1. Mild medium-size reversible myocardial defect in the inferior wall, suspicious for an distal myocardial ischemia.  2. Global left ventricular hypokinesis and moderate left ventricular dilatation.  3. Left ventricular ejection fraction 32%  4. High-risk stress test findings*.  *2012 Appropriate Use Criteria for Coronary Revascularization Focused Update: J Am Coll Cardiol. 207282;06(0):156-153http://content.[email or web address]?articleid=[email or web address]   Electronically Signed   By: JoEarle Gell.D.   On: 09/12/2014 15:51    TELE NSR   ASSESSMENT AND PLAN Unstable angina pectoris with marginal increase in troponin levels. Unfavorable findings on nuclear imaging. The severity of LV dysfunction is out of proportion to the extent of the perfusion abnormality. Consider gating error versus multivessel CAD with relatively balanced ischemia. Recommend cardiac cath/possible PCI: this procedure has been fully reviewed with the patient and written informed consent has been  obtained.   MiSanda KleinMD, FAHillside Endoscopy Center LLCHMG [facility] ([phone] [phone] 09/14/2014 9:09 AM

## 2014-09-14 NOTE — Progress Notes (Signed)
PATIENT DETAILS Name: Sara Fox Age: 74 y.o. Sex: female Date of Birth: Apr 25, 1941 Admit Date: 09/12/2014 Admitting Physician Annita Brod, MD JSE:GBTDVVO,HYWVP N, MD  Subjective: No chest pain this morning.   Assessment/Plan: Principal Problem:   NSTEMI (non-ST elevated myocardial infarction): Admitted with chest pain, troponins were mildly elevated, cardiology consulted, underwent nuclear stress  test which showed  ischemia. Started on IV heparin, aspirin, beta blocker and statin. Scheduled for cardiac catheterization 5/2.  Active Problems:   Chronic systolic heart failure: Clinically compensated. EF per nuclear stress test 32%. However echocardiogram demonstrated EF around 45-50 percent. Will add ACE inhibitor next few days if BP permits    HTN (hypertension): Controlled, continue with metoprolol    Chronic low back pain: Continue Ultram    Dyslipidemia: Continue statin.     Tobacco abuse:Counseled on cessation   Disposition: Remain inpatient-home in 1-2 days  Antimicrobial agents  See below  Anti-infectives    None      DVT Prophylaxis: Heparin infusion  Code Status: Full code   Family Communication None at bedside  Procedures: None  CONSULTS:  cardiology  MEDICATIONS: Scheduled Meds: . [START ON 09/15/2014] aspirin  81 mg Oral Daily  . gabapentin  100 mg Oral TID  . metoprolol tartrate  25 mg Oral BID WC  . multivitamin with minerals  1 tablet Oral Daily  . potassium chloride  15 mEq Oral Daily  . pravastatin  20 mg Oral q1800  . sodium chloride  3 mL Intravenous Q12H  . ticagrelor  90 mg Oral BID   Continuous Infusions: . sodium chloride    . nitroGLYCERIN     PRN Meds:.sodium chloride, acetaminophen, gi cocktail, nitroGLYCERIN, ondansetron (ZOFRAN) IV, sodium chloride, traMADol    PHYSICAL EXAM: Vital signs in last 24 hours: Filed Vitals:   09/14/14 1334 09/14/14 1334 09/14/14 1339 09/14/14 1344  BP:  187/93     Pulse: 275 114 0 281  Temp:      TempSrc:      Resp: 9 4 0 0  Height:      Weight:      SpO2: 0% 0% 0% 0%    Weight change: -2.387 kg (-5 lb 4.2 oz) Filed Weights   09/13/14 0000 09/13/14 0400 09/14/14 0503  Weight: 69.8 kg (153 lb 14.1 oz) 69.8 kg (153 lb 14.1 oz) 67.813 kg (149 lb 8 oz)   Body mass index is 24.88 kg/(m^2).   Gen Exam: Awake and alert with clear speech.  Not in any distress Neck: Supple, No JVD.   Chest: B/L Clear.  No rhonchi CVS: S1 S2 Regular, no murmurs.  Abdomen: soft, BS +, non tender, non distended.  Extremities: no edema, lower extremities warm to touch. Neurologic: Non Focal.   Skin: No Rash.   Wounds: N/A.   Intake/Output from previous day:  Intake/Output Summary (Last 24 hours) at 09/14/14 1405 Last data filed at 09/13/14 2200  Gross per 24 hour  Intake 194.33 ml  Output    600 ml  Net -405.67 ml     LAB RESULTS: CBC  Recent Labs Lab 09/12/14 0511 09/13/14 0815 09/14/14 0333  WBC 8.1 5.1 6.3  HGB 11.0* 11.5* 10.8*  HCT 34.5* 36.0 33.6*  PLT 390 355 335  MCV 98.6 97.3 96.6  MCH 31.4 31.1 31.0  MCHC 31.9 31.9 32.1  RDW 13.2 13.1 13.1    Chemistries  Recent Labs Lab 09/12/14 0511 09/14/14 0333  NA 139 143  K 4.1 3.2*  CL 107 110  CO2 23 26  GLUCOSE 127* 95  BUN 13 9  CREATININE 0.91 0.67  CALCIUM 9.0 8.8*    CBG: No results for input(s): GLUCAP in the last 168 hours.  GFR Estimated Creatinine Clearance: 54.7 mL/min (by C-G formula based on Cr of 0.67).  Coagulation profile  Recent Labs Lab 09/13/14 1951  INR 0.93    Cardiac Enzymes  Recent Labs Lab 09/12/14 1041 09/12/14 1640 09/13/14 0010  TROPONINI 0.03 0.09* 0.09*    Invalid input(s): POCBNP No results for input(s): DDIMER in the last 72 hours. No results for input(s): HGBA1C in the last 72 hours.  Recent Labs  09/12/14 1041  CHOL 158  HDL 45  LDLCALC 101*  TRIG 61  CHOLHDL 3.5   No results for input(s): TSH, T4TOTAL, T3FREE,  THYROIDAB in the last 72 hours.  Invalid input(s): FREET3 No results for input(s): VITAMINB12, FOLATE, FERRITIN, TIBC, IRON, RETICCTPCT in the last 72 hours.  Recent Labs  09/12/14 0511  LIPASE 21    Urine Studies No results for input(s): UHGB, CRYS in the last 72 hours.  Invalid input(s): UACOL, UAPR, USPG, UPH, UTP, UGL, UKET, UBIL, UNIT, UROB, ULEU, UEPI, UWBC, URBC, UBAC, CAST, UCOM, BILUA  MICROBIOLOGY: No results found for this or any previous visit (from the past 240 hour(s)).  RADIOLOGY STUDIES/RESULTS: Nm Myocar Multi W/spect W/wall Motion / Ef  09/12/2014   CLINICAL DATA:  Chest pain.  Hypertension.  Hyperlipidemia.  EXAM: MYOCARDIAL IMAGING WITH SPECT (REST AND PHARMACOLOGIC-STRESS)  GATED LEFT VENTRICULAR WALL MOTION STUDY  LEFT VENTRICULAR EJECTION FRACTION  TECHNIQUE: Standard myocardial SPECT imaging was performed after resting intravenous injection of 10 mCi Tc-30msestamibi. Subsequently, intravenous infusion of Lexiscan was performed under the supervision of the Cardiology staff. At peak effect of the drug, 30 mCi Tc-956mestamibi was injected intravenously and standard myocardial SPECT imaging was performed. Quantitative gated imaging was also performed to evaluate left ventricular wall motion, and estimate left ventricular ejection fraction.  COMPARISON:  None.  FINDINGS: Perfusion: Stress images show an inferior wall defect which is of medium-sized and mild severity, and shows reversibility on resting images. This is suspicious for an area of a inducible myocardial ischemia.  Wall Motion: Global left ventricular hypokinesis is seen. Moderate left ventricular dilatation noted.  Left Ventricular Ejection Fraction: 32 %  End diastolic volume 12793l  End systolic volume 88 ml  IMPRESSION: 1. Mild medium-size reversible myocardial defect in the inferior wall, suspicious for an distal myocardial ischemia.  2. Global left ventricular hypokinesis and moderate left ventricular  dilatation.  3. Left ventricular ejection fraction 32%  4. High-risk stress test findings*.  *2012 Appropriate Use Criteria for Coronary Revascularization Focused Update: J Am Coll Cardiol. 209030;09(2):330-076http://content.[email or web address]?[email or web address]   Electronically Signed   By: JoEarle Gell.D.   On: 09/12/2014 15:51   Dg Chest Port 1 View  09/12/2014   CLINICAL DATA:  Chest pain. History of hypertension, cervical cancer and hyperlipidemia.  EXAM: PORTABLE CHEST - 1 VIEW  COMPARISON:  Chest radiograph April 29, 2010  FINDINGS: Cardiac silhouette is normal in appearance. Calcified aortic knob. Mild bronchitic change without pleural effusion or focal consolidation. No pneumothorax. RIGHT shoulder arthroplasty. Partially imaged severe degenerative change of the LEFT shoulder.  IMPRESSION: Similar bronchitic changes without focal consolidation.   Electronically Signed   By: CoElon Alas On: 09/12/2014 05:52  Oren Binet, MD  [facility] Pager:[phone]  If 7PM-7AM, please contact night-coverage [email or web address] Password [email or web address] 09/14/2014, 2:05 PM   LOS: 1 day

## 2014-09-14 NOTE — Progress Notes (Signed)
Patient Name: Sara Fox Date of Encounter: 09/14/2014  Principal Problem:   NSTEMI (non-ST elevated myocardial infarction) Active Problems:   HTN (hypertension)   Chronic low back pain   Sacroiliac dysfunction   Chest pain   Tobacco abuse   Dyslipidemia   CKD (chronic kidney disease), stage II   Anemia   Chronic diastolic heart failure, NYHA class 1   Cardiomyopathy, ischemic   Chronic systolic heart failure   Length of Stay: 1  SUBJECTIVE  Left shoulder pain radiating down arm yesterday. Resolved 5' after SL NTG. Did not tolerate topical NTG due to headache.  CURRENT MEDS . aspirin  325 mg Oral Daily  . gabapentin  100 mg Oral TID  . metoprolol tartrate  25 mg Oral BID WC  . multivitamin with minerals  1 tablet Oral Daily  . nitroGLYCERIN  0.5 inch Topical 3 times per day  . potassium chloride  15 mEq Oral Daily  . pravastatin  20 mg Oral q1800  . sodium chloride  3 mL Intravenous Q12H    OBJECTIVE   Intake/Output Summary (Last 24 hours) at 09/14/14 0909 Last data filed at 09/13/14 2200  Gross per 24 hour  Intake 194.33 ml  Output    800 ml  Net -605.67 ml   Filed Weights   09/13/14 0000 09/13/14 0400 09/14/14 0503  Weight: 153 lb 14.1 oz (69.8 kg) 153 lb 14.1 oz (69.8 kg) 149 lb 8 oz (67.813 kg)    PHYSICAL EXAM Filed Vitals:   09/13/14 1510 09/13/14 1727 09/13/14 2010 09/14/14 0503  BP: 138/79 166/96 163/84 151/58  Pulse: 78 73 69 73  Temp:  98.3 F (36.8 C) 98.7 F (37.1 C) 98.6 F (37 C)  TempSrc:  Oral Oral Oral  Resp:      Height:      Weight:    149 lb 8 oz (67.813 kg)  SpO2: 100% 98% 100% 100%   General: Alert, oriented x3, no distress Head: no evidence of trauma, PERRL, EOMI, no exophtalmos or lid lag, no myxedema, no xanthelasma; normal ears, nose and oropharynx Neck: normal jugular venous pulsations and no hepatojugular reflux; brisk carotid pulses without delay and no carotid bruits Chest: clear to auscultation, no signs of  consolidation by percussion or palpation, normal fremitus, symmetrical and full respiratory excursions Cardiovascular: normal position and quality of the apical impulse, regular rhythm, normal first heart sound and normal second heart sound, no rubs or gallops, 2/6 early peaking aortic ejection murmur Abdomen: no tenderness or distention, no masses by palpation, no abnormal pulsatility or arterial bruits, normal bowel sounds, no hepatosplenomegaly Extremities: no clubbing, cyanosis; 1+ symmetrcal ankle edema; 2+ radial, ulnar and brachial pulses bilaterally; 2+ right femoral, posterior tibial and dorsalis pedis pulses; 2+ left femoral, posterior tibial and dorsalis pedis pulses; no subclavian or femoral bruits Neurological: grossly nonfocal  LABS  CBC  Recent Labs  09/13/14 0815 09/14/14 0333  WBC 5.1 6.3  HGB 11.5* 10.8*  HCT 36.0 33.6*  MCV 97.3 96.6  PLT 355 329   Basic Metabolic Panel  Recent Labs  09/12/14 0511 09/14/14 0333  NA 139 143  K 4.1 3.2*  CL 107 110  CO2 23 26  GLUCOSE 127* 95  BUN 13 9  CREATININE 0.91 0.67  CALCIUM 9.0 8.8*   Liver Function Tests  Recent Labs  09/12/14 0511  AST 30  ALT 19  ALKPHOS 80  BILITOT 0.5  PROT 6.3  ALBUMIN 3.3*    Recent Labs  09/12/14 0511  LIPASE 21   Cardiac Enzymes  Recent Labs  09/12/14 1041 09/12/14 1640 09/13/14 0010  TROPONINI 0.03 0.09* 0.09*     Recent Labs  09/12/14 1041  CHOL 158  HDL 45  LDLCALC 101*  TRIG 61  CHOLHDL 3.5   Thyroid Function Tests No results for input(s): TSH, T4TOTAL, T3FREE, THYROIDAB in the last 72 hours.  Invalid input(s): [place]  Radiology Studies Imaging results have been reviewed and Nm Myocar Multi W/spect W/wall Motion / Ef  09/12/2014   CLINICAL DATA:  Chest pain.  Hypertension.  Hyperlipidemia.  EXAM: MYOCARDIAL IMAGING WITH SPECT (REST AND PHARMACOLOGIC-STRESS)  GATED LEFT VENTRICULAR WALL MOTION STUDY  LEFT VENTRICULAR EJECTION FRACTION  TECHNIQUE:  Standard myocardial SPECT imaging was performed after resting intravenous injection of 10 mCi Tc-12msestamibi. Subsequently, intravenous infusion of Lexiscan was performed under the supervision of the Cardiology staff. At peak effect of the drug, 30 mCi Tc-943mestamibi was injected intravenously and standard myocardial SPECT imaging was performed. Quantitative gated imaging was also performed to evaluate left ventricular wall motion, and estimate left ventricular ejection fraction.  COMPARISON:  None.  FINDINGS: Perfusion: Stress images show an inferior wall defect which is of medium-sized and mild severity, and shows reversibility on resting images. This is suspicious for an area of a inducible myocardial ischemia.  Wall Motion: Global left ventricular hypokinesis is seen. Moderate left ventricular dilatation noted.  Left Ventricular Ejection Fraction: 32 %  End diastolic volume 12343l  End systolic volume 88 ml  IMPRESSION: 1. Mild medium-size reversible myocardial defect in the inferior wall, suspicious for an distal myocardial ischemia.  2. Global left ventricular hypokinesis and moderate left ventricular dilatation.  3. Left ventricular ejection fraction 32%  4. High-risk stress test findings*.  *2012 Appropriate Use Criteria for Coronary Revascularization Focused Update: J Am Coll Cardiol. 205686;16(8):372-902http://content.onairportbarriers.comspx?articleid=1201161   Electronically Signed   By: JoEarle Gell.D.   On: 09/12/2014 15:51    TELE NSR   ASSESSMENT AND PLAN Unstable angina pectoris with marginal increase in troponin levels. Unfavorable findings on nuclear imaging. The severity of LV dysfunction is out of proportion to the extent of the perfusion abnormality. Consider gating error versus multivessel CAD with relatively balanced ischemia. Recommend cardiac cath/possible PCI: this procedure has been fully reviewed with the patient and written informed consent has been  obtained.   MiSanda KleinMD, [facility] ([phone] ([phone] 09/14/2014 9:09 AM

## 2014-09-14 NOTE — Care Management Note (Unsigned)
    Page 1 of 1   09/14/2014     11:31:13 AM CARE MANAGEMENT NOTE 09/14/2014  Patient:  Sara Fox, Sara Fox   Account Number:  [id number]  Date Initiated:  09/14/2014  Documentation initiated by:  Elenor Quinones  Subjective/Objective Assessment:   Pt admitted with non stemi     Action/Plan:   Pt is scheduled for Cardiac Cath 09/14/14.  CM will continue to monitor for disposition needs   Anticipated DC Date:  09/14/2014   Anticipated DC Plan:  [place]  CM consult      Choice offered to / List presented to:             Status of service:  In process, will continue to follow Medicare Important Message given?  YES (If response is "NO", the following Medicare IM given date fields will be blank) Date Medicare IM given:  09/14/2014 Medicare IM given by:  Elenor Quinones Date Additional Medicare IM given:   Additional Medicare IM given by:    Discharge Disposition:    Per UR Regulation:    If discussed at Long Length of Stay Meetings, dates discussed:    Comments:

## 2014-09-14 NOTE — Care Management Note (Addendum)
Case Management Note  Patient Details  Name: FELISE GEORGIA MRN: [id number] Date of Birth: [birth year]  Subjective/Objective:                 Pt from home with family admitted with NSTEMI.   Action/Plan: CM spoke to pt regarding Brilinta. Pamphlet with  30 day free card given to pt. Benefit check in process regarding copay and prior authorization, CM will f/u with pt with  results. [facility] states uses [facility] [address]. Pharmacy called by CM to confirm medication instock. Pharmacy stated will be in stock by 4pm on 09/15/2014, pt made aware. CM f/u with d/c needs.   Expected Discharge Date:  09/15/14               Expected Discharge Plan:  Home/Self Care  In-House Referral:     Discharge planning Services  CM Consult  Post Acute Care Choice:    Choice offered to:     DME Arranged:    DME Agency:     HH Arranged:    HH Agency:     Status of Service:     Medicare Important Message Given:  N/A - LOS <3 / Initial given by admissions Date Medicare IM Given:    Medicare IM give by:    Date Additional Medicare IM Given:    Additional Medicare Important Message give by:     If discussed at [place] of Stay Meetings, dates discussed:    Additional Comments:  Sharin Mons, [place] 09/14/2014, 3:43 PM

## 2014-09-14 NOTE — Interval H&P Note (Signed)
History and Physical Interval Note:  09/14/2014 12:00 PM  Sara Fox  has presented today for surgery, with the diagnosis of unstable angina & abnormal nuclear stress test  The various methods of treatment have been discussed with the patient and family. After consideration of risks, benefits and other options for treatment, the patient has consented to  Procedure(s): Left Heart Cath and Coronary Angiography (N/A) +/- PCI as a surgical intervention .  The patient's history has been reviewed, patient examined, no change in status, stable for surgery.  I have reviewed the patient's chart and labs.  Questions were answered to the patient's satisfaction.     Cath Lab Visit (complete for each Cath Lab visit)  Clinical Evaluation Leading to the Procedure:   ACS: Yes.    Non-ACS:    Anginal Classification: CCS IV  Anti-ischemic medical therapy: No Therapy  Non-Invasive Test Results: High-risk stress test findings: cardiac mortality >3%/year  Prior CABG: No previous CABG   HARDING, DAVID W

## 2014-09-15 ENCOUNTER — Encounter (HOSPITAL_COMMUNITY): Payer: Self-pay | Admitting: Cardiology

## 2014-09-15 DIAGNOSIS — Z720 Tobacco use: Secondary | ICD-10-CM

## 2014-09-15 LAB — CBC
HCT: 31.9 % — ABNORMAL LOW (ref 36.0–46.0)
Hemoglobin: 10.3 g/dL — ABNORMAL LOW (ref 12.0–15.0)
MCH: 31.4 pg (ref 26.0–34.0)
MCHC: 32.3 g/dL (ref 30.0–36.0)
MCV: 97.3 fL (ref 78.0–100.0)
Platelets: 276 10*3/uL (ref 150–400)
RBC: 3.28 MIL/uL — ABNORMAL LOW (ref 3.87–5.11)
RDW: 12.9 % (ref 11.5–15.5)
WBC: 5 10*3/uL (ref 4.0–10.5)

## 2014-09-15 LAB — BASIC METABOLIC PANEL
Anion gap: 9 (ref 5–15)
BUN: 8 mg/dL (ref 6–20)
CO2: 23 mmol/L (ref 22–32)
Calcium: 8.9 mg/dL (ref 8.9–10.3)
Chloride: 108 mmol/L (ref 101–111)
Creatinine, Ser: 0.7 mg/dL (ref 0.44–1.00)
GFR calc Af Amer: >60 mL/min (ref >60)
GFR calc non Af Amer: >60 mL/min (ref >60)
Glucose, Bld: 104 mg/dL — ABNORMAL HIGH (ref 70–99)
Potassium: 3.6 mmol/L (ref 3.5–5.1)
Sodium: 140 mmol/L (ref 135–145)

## 2014-09-15 MED ORDER — LISINOPRIL 10 MG PO TABS
10.0000 mg | ORAL_TABLET | Freq: Every day | ORAL | Status: DC
  Administered 2014-09-15: 11:00:00 10 mg via ORAL
  Filled 2014-09-15 (×2): qty 1

## 2014-09-15 MED ORDER — TICAGRELOR 90 MG PO TABS: 90.0000 mg | ORAL_TABLET | Freq: Two times a day (BID) | ORAL | Status: DC

## 2014-09-15 MED ORDER — ROSUVASTATIN CALCIUM 20 MG PO TABS: 20.0000 mg | ORAL_TABLET | Freq: Every day | ORAL | Status: DC

## 2014-09-15 MED ORDER — ROSUVASTATIN CALCIUM 40 MG PO TABS: 40.0000 mg | ORAL_TABLET | Freq: Every day | ORAL | Status: DC

## 2014-09-15 MED ORDER — LISINOPRIL 10 MG PO TABS: 10.0000 mg | ORAL_TABLET | Freq: Every day | ORAL | Status: DC

## 2014-09-15 MED ORDER — METOPROLOL TARTRATE 50 MG PO TABS: 50.0000 mg | ORAL_TABLET | Freq: Two times a day (BID) | ORAL | Status: DC

## 2014-09-15 MED ORDER — ASPIRIN 81 MG PO TABS: 81.0000 mg | ORAL_TABLET | Freq: Every day | ORAL | Status: DC

## 2014-09-15 MED ORDER — ROSUVASTATIN CALCIUM 40 MG PO TABS
40.0000 mg | ORAL_TABLET | Freq: Every day | ORAL | Status: DC
  Filled 2014-09-15: qty 1

## 2014-09-15 NOTE — Discharge Summary (Signed)
PATIENT DETAILS Name: Sara Fox Age: 74 y.o. Sex: female Date of Birth: 09-Feb-1941 MRN: [id number]. Admitting Physician: Annita Brod, MD QPR:[id number],[id number] N, MD  Admit Date: 09/12/2014 Discharge date: 09/15/2014  Recommendations for Outpatient Follow-up:  1. Please check lipid panel in 3-6 months-LDL not at goal-started on Crestor, Pravachol discontinued. 2. General age-related health maintenance 3. Encourage tobacco cessation   PRIMARY DISCHARGE DIAGNOSIS:  Principal Problem:   NSTEMI (non-ST elevated myocardial infarction) Active Problems:   HTN (hypertension)   Chronic low back pain   Sacroiliac dysfunction   Chest pain   Tobacco abuse   Dyslipidemia   CKD (chronic kidney disease), stage II   Anemia   Chronic diastolic heart failure, NYHA class 1   Cardiomyopathy, ischemic   Chronic systolic heart failure      PAST MEDICAL HISTORY: Past Medical History  Diagnosis Date  . Hypertension   . Hyperlipidemia   . Stomach problems   . Cervical cancer   . Back pain   . Neck pain     DISCHARGE MEDICATIONS: Discharge Medication List as of 09/15/2014 11:03 AM    START taking these medications   Details  lisinopril (PRINIVIL,ZESTRIL) 10 MG tablet Take 1 tablet (10 mg total) by mouth daily., Starting 09/15/2014, Until Discontinued, Print    metoprolol tartrate (LOPRESSOR) 50 MG tablet Take 1 tablet (50 mg total) by mouth 2 (two) times daily with a meal., Starting 09/15/2014, Until Discontinued, Print    rosuvastatin (CRESTOR) 40 MG tablet Take 1 tablet (40 mg total) by mouth daily at 6 PM., Starting 09/15/2014, Until Discontinued, Print    ticagrelor (BRILINTA) 90 MG TABS tablet Take 1 tablet (90 mg total) by mouth 2 (two) times daily., Starting 09/15/2014, Until Discontinued, Print      CONTINUE these medications which have CHANGED   Details  aspirin 81 MG tablet Take 1 tablet (81 mg total) by mouth daily., Starting 09/15/2014, Until Discontinued, No Print        CONTINUE these medications which have NOT CHANGED   Details  CALCIUM CITRATE PO Take 1 tablet by mouth daily. , Until Discontinued, Historical Med    Cholecalciferol (VITAMIN D3) 2000 UNITS capsule Take 2,000 Units by mouth daily., Until Discontinued, Historical Med    gabapentin (NEURONTIN) 100 MG capsule Take 1 capsule (100 mg total) by mouth 3 (three) times daily., Starting 08/06/2014, Until Discontinued, Normal    Multiple Vitamin (MULTIVITAMIN) capsule Take 1 capsule by mouth daily. Contains iron, Until Discontinued, Historical Med    traMADol (ULTRAM) 50 MG tablet Take 1 tablet (50 mg total) by mouth 5 (five) times daily as needed., Starting 08/06/2014, Until Discontinued, Print    vitamin C (ASCORBIC ACID) 500 MG tablet Take 500 mg by mouth daily., Until Discontinued, Historical Med    budesonide-formoterol (SYMBICORT) 160-4.5 MCG/ACT inhaler Inhale 2 puffs into the lungs 2 (two) times daily., Until Discontinued, Historical Med    furosemide (LASIX) 20 MG tablet Take 20 mg by mouth daily., Until Discontinued, Historical Med      STOP taking these medications     ibuprofen (ADVIL,MOTRIN) 200 MG tablet      lovastatin (MEVACOR) 20 MG tablet         ALLERGIES:   Allergies  Allergen Reactions  . Morphine And Related Shortness Of Breath    Throat swelling/trouble breathing and lethargic  . Celebrex [Celecoxib]   . Codeine   . Vioxx [Rofecoxib]     BRIEF HPI:  See H&P, Labs, Consult  and Test reports for all details in brief, patient was admitted for evaluation of chest pain   CONSULTATIONS:   cardiology  PERTINENT RADIOLOGIC STUDIES: Nm Myocar Multi W/spect W/wall Motion / Ef  09/12/2014   CLINICAL DATA:  Chest pain.  Hypertension.  Hyperlipidemia.  EXAM: MYOCARDIAL IMAGING WITH SPECT (REST AND PHARMACOLOGIC-STRESS)  GATED LEFT VENTRICULAR WALL MOTION STUDY  LEFT VENTRICULAR EJECTION FRACTION  TECHNIQUE: Standard myocardial SPECT imaging was performed after resting  intravenous injection of 10 mCi Tc-65msestamibi. Subsequently, intravenous infusion of Lexiscan was performed under the supervision of the Cardiology staff. At peak effect of the drug, 30 mCi Tc-977mestamibi was injected intravenously and standard myocardial SPECT imaging was performed. Quantitative gated imaging was also performed to evaluate left ventricular wall motion, and estimate left ventricular ejection fraction.  COMPARISON:  None.  FINDINGS: Perfusion: Stress images show an inferior wall defect which is of medium-sized and mild severity, and shows reversibility on resting images. This is suspicious for an area of a inducible myocardial ischemia.  Wall Motion: Global left ventricular hypokinesis is seen. Moderate left ventricular dilatation noted.  Left Ventricular Ejection Fraction: 32 %  End diastolic volume 12595l  End systolic volume 88 ml  IMPRESSION: 1. Mild medium-size reversible myocardial defect in the inferior wall, suspicious for an distal myocardial ischemia.  2. Global left ventricular hypokinesis and moderate left ventricular dilatation.  3. Left ventricular ejection fraction 32%  4. High-risk stress test findings*.  *2012 Appropriate Use Criteria for Coronary Revascularization Focused Update: J Am Coll Cardiol. 206387;56(4):332-951http://content.onairportbarriers.comspx?articleid=1201161   Electronically Signed   By: JoEarle Gell.D.   On: 09/12/2014 15:51   Dg Chest Port 1 View  09/12/2014   CLINICAL DATA:  Chest pain. History of hypertension, cervical cancer and hyperlipidemia.  EXAM: PORTABLE CHEST - 1 VIEW  COMPARISON:  Chest radiograph April 29, 2010  FINDINGS: Cardiac silhouette is normal in appearance. Calcified aortic knob. Mild bronchitic change without pleural effusion or focal consolidation. No pneumothorax. RIGHT shoulder arthroplasty. Partially imaged severe degenerative change of the LEFT shoulder.  IMPRESSION: Similar bronchitic changes without focal consolidation.    Electronically Signed   By: CoElon Alas On: 09/12/2014 05:52     PERTINENT LAB RESULTS: CBC:  Recent Labs  09/14/14 0333 09/15/14 0600  WBC 6.3 5.0  HGB 10.8* 10.3*  HCT 33.6* 31.9*  PLT 335 276   CMET CMP     Component Value Date/Time   NA 140 09/15/2014 0600   K 3.6 09/15/2014 0600   CL 108 09/15/2014 0600   CO2 23 09/15/2014 0600   GLUCOSE 104* 09/15/2014 0600   BUN 8 09/15/2014 0600   CREATININE 0.70 09/15/2014 0600   CALCIUM 8.9 09/15/2014 0600   PROT 6.3 09/12/2014 0511   ALBUMIN 3.3* 09/12/2014 0511   AST 30 09/12/2014 0511   ALT 19 09/12/2014 0511   ALKPHOS 80 09/12/2014 0511   BILITOT 0.5 09/12/2014 0511   GFRNONAA >60 09/15/2014 0600   GFRAA >60 09/15/2014 0600    GFR Estimated Creatinine Clearance: 59.9 mL/min (by C-G formula based on Cr of 0.7). No results for input(s): LIPASE, AMYLASE in the last 72 hours.  Recent Labs  09/12/14 1640 09/13/14 0010  TROPONINI 0.09* 0.09*   Invalid input(s): POCBNP No results for input(s): DDIMER in the last 72 hours. No results for input(s): HGBA1C in the last 72 hours. No results for input(s): CHOL, HDL, LDLCALC, TRIG, CHOLHDL, LDLDIRECT in the last 72 hours. No results  for input(s): TSH, T4TOTAL, T3FREE, THYROIDAB in the last 72 hours.  Invalid input(s): FREET3 No results for input(s): VITAMINB12, FOLATE, FERRITIN, TIBC, IRON, RETICCTPCT in the last 72 hours. Coags:  Recent Labs  09/13/14 1951  INR 0.93   Microbiology: No results found for this or any previous visit (from the past 240 hour(s)).   BRIEF HOSPITAL COURSE:  NSTEMI (non-ST elevated myocardial infarction): Admitted with chest pain, troponins were mildly elevated, cardiology consulted, underwent nuclear stress test which showed ischemia. Started on IV heparin, aspirin, beta blocker and statin. Underwent cardiac catheterization 5/2 which showed a distal RCA lesion with 95 % stenosis-which was treated with a drug eluting stent.  Patient was observed overnight, seen by cards today and cleared for discharge on dual antiplatelet therapy, crestor as LDL not at goal, and metoprolol  Active Problems:  Chronic systolic heart failure: Clinically compensated. EF per nuclear stress test 32%. However echocardiogram demonstrated EF around 45-50 percent. Started on on Lisinopril on discharge. Continue beta blocker   HTN (hypertension): Controlled, continue with metoprolol. Added Lisinopril-see above.    Chronic low back pain: Continue Ultram   Dyslipidemia: Continue statin-changed to crestor 40 mg as LDL not at goal.    Tobacco abuse:Counseled on cessation    TODAY-DAY OF DISCHARGE:  Subjective:   Elesa Hacker today has no headache,no chest abdominal pain,no new weakness tingling or numbness, feels much better wants to go home today.   Objective:   Blood pressure 157/75, pulse 89, temperature 98.2 F (36.8 C), temperature source Oral, resp. rate 18, height '5\' 5"'$  (1.651 m), weight 70.4 kg (155 lb 3.3 oz), SpO2 97 %.  Intake/Output Summary (Last 24 hours) at 09/15/14 1606 Last data filed at 09/15/14 0800  Gross per 24 hour  Intake 1141.85 ml  Output   1700 ml  Net -558.15 ml   Filed Weights   09/13/14 0400 09/14/14 0503 09/15/14 0022  Weight: 69.8 kg (153 lb 14.1 oz) 67.813 kg (149 lb 8 oz) 70.4 kg (155 lb 3.3 oz)    Exam Awake Alert, Oriented *3, No new F.N deficits, Normal affect [place].AT,PERRAL Supple Neck,No JVD, No cervical lymphadenopathy appriciated.  Symmetrical Chest wall movement, Good air movement bilaterally, CTAB RRR,No Gallops,Rubs or new Murmurs, No Parasternal Heave +ve B.Sounds, Abd Soft, Non tender, No organomegaly appriciated, No rebound -guarding or rigidity. No Cyanosis, Clubbing or edema, No new Rash or bruise  DISCHARGE CONDITION: Stable  DISPOSITION: Home  DISCHARGE INSTRUCTIONS:    Activity:  As tolerated   Diet recommendation: Heart Healthy diet  Discharge Instructions     Amb Referral to Cardiac Rehabilitation    Complete by:  As directed   Congestive Heart Failure: If diagnosis is Heart Failure, patient MUST meet each of the CMS criteria: 1. Left Ventricular Ejection Fraction </= 35% 2. NYHA class II-IV symptoms despite being on optimal heart failure therapy for at least 6 weeks. 3. Stable = have not had a recent (<6 weeks) or planned (<6 months) major cardiovascular hospitalization or procedure  Program Details: - Physician supervised classes - 1-3 classes per week over a 12-18 week period, generally for a total of 36 sessions  Physician Certification: I certify that the above Cardiac Rehabilitation treatment is medically necessary and is medically approved by me for treatment of this patient. The patient is willing and cooperative, able to ambulate and medically stable to participate in exercise rehabilitation. The participant's progress and Individualized Treatment Plan will be reviewed by the Medical Director, Cardiac Rehab staff and  as indicated by the Referring/Ordering Physician.  Diagnosis:  PCI     Call MD for:  difficulty breathing, headache or visual disturbances    Complete by:  As directed      Call MD for:  persistant dizziness or light-headedness    Complete by:  As directed      Call MD for:  severe uncontrolled pain    Complete by:  As directed      Diet - low sodium heart healthy    Complete by:  As directed      Increase activity slowly    Complete by:  As directed            Follow-up Information    Follow up with Maximino Greenland, MD. Schedule an appointment as soon as possible for a visit in 1 week.   Specialty:  Internal Medicine   Contact information:   [address] [place] [address] [phone]       Follow up with Pixie Casino, MD.   Specialty:  Cardiology   Why:  Office will contact you to arrange followup, please give [place] a call if you do not hear from [place] in 2 business days   Contact  information:   [address] [place] [address] [phone]       Total Time spent on discharge equals  45 minutes.  SignedOren Binet 09/15/2014 4:06 PM

## 2014-09-15 NOTE — Progress Notes (Signed)
CARDIAC REHAB PHASE I   PRE:  Rate/Rhythm: 88 SR    BP: sitting 157/75    SaO2:   MODE:  Ambulation: 600 ft   POST:  Rate/Rhythm: 97 SR    BP: sitting 171/74     SaO2:   Pt urgently to BR upon arrival, loose stool. Able to walk however needed to sit and rest at 1/2 way due to SOB and back pain (wears brace normally). Sts she normally walks her dog twice a day. Ed completed however pt not interested in much change. Sts she has been cutting back on smoking and that continues to be her plan. She is interested in [facility] if her husband can come with her and wait. Will send referral to [facility]   Josephina Shih [place] CES, ACSM 09/15/2014 9:12 AM

## 2014-09-15 NOTE — Progress Notes (Signed)
Patient Name: STATIA BURDICK Date of Encounter: 09/15/2014  Principal Problem:   NSTEMI (non-ST elevated myocardial infarction) Active Problems:   HTN (hypertension)   Chronic low back pain   Sacroiliac dysfunction   Chest pain   Tobacco abuse   Dyslipidemia   CKD (chronic kidney disease), stage II   Anemia   Chronic diastolic heart failure, NYHA class 1   Cardiomyopathy, ischemic   Chronic systolic heart failure   Length of Stay: 2  SUBJECTIVE  Feels great. No bleeding problems and no chest pain.  CURRENT MEDS . aspirin  81 mg Oral Daily  . gabapentin  100 mg Oral TID  . lisinopril  10 mg Oral Daily  . metoprolol tartrate  50 mg Oral BID WC  . multivitamin with minerals  1 tablet Oral Daily  . potassium chloride  15 mEq Oral Daily  . rosuvastatin  40 mg Oral q1800  . sodium chloride  3 mL Intravenous Q12H  . ticagrelor  90 mg Oral BID    OBJECTIVE   Intake/Output Summary (Last 24 hours) at 09/15/14 1000 Last data filed at 09/15/14 0800  Gross per 24 hour  Intake 1642.63 ml  Output   2050 ml  Net -407.37 ml   Filed Weights   09/13/14 0400 09/14/14 0503 09/15/14 0022  Weight: 153 lb 14.1 oz (69.8 kg) 149 lb 8 oz (67.813 kg) 155 lb 3.3 oz (70.4 kg)    PHYSICAL EXAM Filed Vitals:   09/15/14 0336 09/15/14 0551 09/15/14 0739 09/15/14 0755  BP: 154/69 142/80  157/75  Pulse:  68  89  Temp:  98.4 F (36.9 C) 98.2 F (36.8 C) 98.2 F (36.8 C)  TempSrc:  Oral Oral Oral  Resp: '18 20  18  '$ Height:      Weight:      SpO2: 99% 100%  97%   General: Alert, oriented x3, no distress Head: no evidence of trauma, PERRL, EOMI, no exophtalmos or lid lag, no myxedema, no xanthelasma; normal ears, nose and oropharynx Neck: normal jugular venous pulsations and no hepatojugular reflux; brisk carotid pulses without delay and no carotid bruits Chest: clear to auscultation, no signs of consolidation by percussion or palpation, normal fremitus, symmetrical and full respiratory  excursions Cardiovascular: normal position and quality of the apical impulse, regular rhythm, normal first and second heart sounds, no rubs or gallops, no murmur Abdomen: no tenderness or distention, no masses by palpation, no abnormal pulsatility or arterial bruits, normal bowel sounds, no hepatosplenomegaly Extremities: no clubbing, cyanosis or edema; 2+ radial, ulnar and brachial pulses bilaterally; 2+ right femoral, posterior tibial and dorsalis pedis pulses; 2+ left femoral, posterior tibial and dorsalis pedis pulses; no subclavian or femoral bruits Neurological: grossly nonfocal  LABS  CBC  Recent Labs  09/14/14 0333 09/15/14 0600  WBC 6.3 5.0  HGB 10.8* 10.3*  HCT 33.6* 31.9*  MCV 96.6 97.3  PLT 335 009   Basic Metabolic Panel  Recent Labs  09/14/14 0333 09/15/14 0600  NA 143 140  K 3.2* 3.6  CL 110 108  CO2 26 23  GLUCOSE 95 104*  BUN 9 8  CREATININE 0.67 0.70  CALCIUM 8.8* 8.9   Liver Function Tests No results for input(s): AST, ALT, ALKPHOS, BILITOT, PROT, ALBUMIN in the last 72 hours. No results for input(s): LIPASE, AMYLASE in the last 72 hours. Cardiac Enzymes  Recent Labs  09/12/14 1041 09/12/14 1640 09/13/14 0010  TROPONINI 0.03 0.09* 0.09*   BNP Invalid input(s): POCBNP D-Dimer  No results for input(s): DDIMER in the last 72 hours. Hemoglobin A1C No results for input(s): HGBA1C in the last 72 hours. Fasting Lipid Panel  Recent Labs  09/12/14 1041  CHOL 158  HDL 45  LDLCALC 101*  TRIG 61  CHOLHDL 3.5   Thyroid Function Tests No results for input(s): TSH, T4TOTAL, T3FREE, THYROIDAB in the last 72 hours.  Invalid input(s): [place]  Radiology Studies Imaging results have been reviewed and No results found.  TELE NSR  ASSESSMENT AND PLAN  DC home. Cardiac rehab. SMOKING CESSATION discussed in detail. MANDATORY DUAL ANTIPLATELET THERAPY reviewed. Target LDL-C <70 F/U in 2 weeks Lisinopril low dose for LV dysfunction without  overt CHF. Suspect we will see improvement or normalization of LVEF given some time. Continue beta blocker.   Sanda Klein, MD, [facility] [phone] office [phone] pager 09/15/2014 10:00 AM

## 2014-09-16 ENCOUNTER — Telehealth: Payer: Self-pay | Admitting: Internal Medicine

## 2014-09-16 LAB — NM MYOCAR MULTI W/SPECT W/WALL MOTION / EF
Estimated workload: 1 METS
Peak BP: 142 mmHg
Peak HR: 88 {beats}/min
Percent of predicted max HR: 60 %
Stage 1 DBP: 69 mmHg
Stage 1 Grade: 0 %
Stage 1 HR: 80 {beats}/min
Stage 1 SBP: 135 mmHg
Stage 1 Speed: 0 mph
Stage 2 Grade: 0 %
Stage 2 HR: 79 {beats}/min
Stage 2 Speed: 0 mph
Stage 3 DBP: 66 mmHg
Stage 3 Grade: 0 %
Stage 3 HR: 86 {beats}/min
Stage 3 SBP: 138 mmHg
Stage 3 Speed: 0 mph
Stage 4 DBP: 63 mmHg
Stage 4 Grade: 0 %
Stage 4 HR: 88 {beats}/min
Stage 4 SBP: 142 mmHg
Stage 4 Speed: 0 mph

## 2014-09-16 NOTE — Telephone Encounter (Signed)
Closed encounter °

## 2014-09-17 ENCOUNTER — Telehealth: Payer: Self-pay | Admitting: *Deleted

## 2014-09-17 NOTE — Telephone Encounter (Signed)
Faxed orders for phase 2 cardiac rehab - no GXT required.  

## 2014-09-22 MED FILL — Heparin Sodium (Porcine) 2 Unit/ML in Sodium Chloride 0.9%: INTRAMUSCULAR | Qty: 1000 | Status: AC

## 2014-09-24 ENCOUNTER — Telehealth (HOSPITAL_COMMUNITY): Payer: Self-pay | Admitting: *Deleted

## 2014-09-24 NOTE — Telephone Encounter (Signed)
Received signed MD order.  Pt scheduled to see Dr. Debara Pickett on October 15, 2014.  Message left inquiring general well being and to please call to be scheduled for rehab after appointment completed. Cherre Huger, BSN

## 2014-10-15 ENCOUNTER — Ambulatory Visit (INDEPENDENT_AMBULATORY_CARE_PROVIDER_SITE_OTHER): Payer: Medicare Other | Admitting: Internal Medicine

## 2014-10-15 ENCOUNTER — Encounter: Payer: Self-pay | Admitting: Internal Medicine

## 2014-10-15 VITALS — BP 122/74 | HR 76 | Ht 65.0 in | Wt 156.4 lb

## 2014-10-15 DIAGNOSIS — I255 Ischemic cardiomyopathy: Secondary | ICD-10-CM

## 2014-10-15 DIAGNOSIS — R898 Other abnormal findings in specimens from other organs, systems and tissues: Secondary | ICD-10-CM | POA: Diagnosis not present

## 2014-10-15 DIAGNOSIS — I10 Essential (primary) hypertension: Secondary | ICD-10-CM

## 2014-10-15 DIAGNOSIS — I2583 Coronary atherosclerosis due to lipid rich plaque: Secondary | ICD-10-CM

## 2014-10-15 DIAGNOSIS — I5032 Chronic diastolic (congestive) heart failure: Secondary | ICD-10-CM

## 2014-10-15 DIAGNOSIS — I214 Non-ST elevation (NSTEMI) myocardial infarction: Secondary | ICD-10-CM | POA: Diagnosis not present

## 2014-10-15 DIAGNOSIS — I2510 Atherosclerotic heart disease of native coronary artery without angina pectoris: Secondary | ICD-10-CM | POA: Diagnosis not present

## 2014-10-15 MED ORDER — LISINOPRIL 10 MG PO TABS: 10.0000 mg | ORAL_TABLET | Freq: Every day | ORAL | Status: DC

## 2014-10-15 NOTE — Patient Instructions (Signed)
Your physician has recommended you make the following change in your medication...  >> DECREASE furosemide (lasix) to '10mg'$  daily  >> TAKE lisinopril '10mg'$  once daily - for blood pressure   Your physician wants you to follow-up in: 6 months with Dr. Debara Pickett. You will receive a reminder letter in the mail two months in advance. If you don't receive a letter, please call our office to schedule the follow-up appointment.

## 2014-10-16 DIAGNOSIS — I2510 Atherosclerotic heart disease of native coronary artery without angina pectoris: Secondary | ICD-10-CM | POA: Insufficient documentation

## 2014-10-16 NOTE — Progress Notes (Signed)
OFFICE NOTE  Chief Complaint:  Hospital follow-up  Primary Care Physician: Sara Greenland, MD  HPI:  Sara Fox is a 74 year old female with history of multiple surgeries in the past including knee replacement, shoulder surgery, subtotal gastrectomy, and recently she underwent back surgery. She has actually done very well with all these surgeries; has had no cardiac complications, MIs, or any other significant issues. She did have an echocardiogram which showed an EF of 55% recently with mild diastolic dysfunction. There are family risk factors for coronary disease, but she is on appropriate medications including aspirin and a statin. Recently she underwent a Corus gene test in your office which was abnormal, demonstrating a score of 20, which indicates a 30% likelihood of obstructive coronary disease. She remains asymptomatic from a cardiac standpoint and I did not feel additional work-up was necessary.  Her main concern today is caring for her husband who has been pretty sick. She reports her blood pressures been under good control. She is currently taking lovastatin and her cholesterol is at goal.  She does report a trace amount of lower extremity edema from time to time.  I saw Sara Fox back today. On fortunately she presented to the hospital with chest pain and was found to have non-STEMI. She underwent cardiac catheterization by Dr. Ellyn Hack in had a severe mid to distal RCA stenosis. She had a placement of a long 33 mm drug-eluting stent which was designed to [place]. She tolerated this well his had no further chest pain. She is currently on aspirin and Brilinta. She reports taking her medications regularly. She does feel that she gets a little bit dry mouth and may be a little dehydrated. She is on Lasix 20 mg daily but was found to have a mildly reduced EF about 45%.  PMHx:  Past Medical History  Diagnosis Date  . Hypertension   . Hyperlipidemia   . Stomach problems   .  Cervical cancer   . Back pain   . Neck pain     Past Surgical History  Procedure Laterality Date  . Knee surgery Bilateral 2001 & 2007  . Shoulder surgery Right   . Neck surgery  2012    2012  . Back surgery  2012    lower back  . Cholecystectomy    . Abdominal hysterectomy    . Partial gastrectomy  2005    subtotal  . Transthoracic echocardiogram  06/02/2010    EF=>55%, normal LV systolic function; normal RV systolic function; mild mitral annular calcif; trace TR; AV mildly sclerotic  . Cardiac catheterization  06/1999    noncritical disease invovling PDA  . Cardiac catheterization N/A 09/14/2014    Procedure: Left Heart Cath and Coronary Angiography;  Surgeon: Leonie Man, MD;  Location: [facility] INVASIVE CV LAB CUPID;  Service: Cardiovascular;  Laterality: N/A;  . Percutaneous coronary stent intervention (pci-s)  09/14/2014    Procedure: Percutaneous Coronary Stent Intervention (Pci-S);  Surgeon: Leonie Man, MD;  Location: [facility] INVASIVE CV LAB CUPID;  Service: Cardiovascular;;    FAMHx:  Family History  Problem Relation Age of Onset  . Heart disease Mother     also HTN  . Diabetes Mother   . Colon cancer Mother   . Heart disease Brother     deceased at 21  . Hypertension Brother   . Heart attack Brother   . Hypertension Brother   . Heart disease Brother     SOCHx:   reports that  she has been smoking.  She has never used smokeless tobacco. She reports that she does not drink alcohol or use illicit drugs.  ALLERGIES:  Allergies  Allergen Reactions  . Morphine And Related Shortness Of Breath    Throat swelling/trouble breathing and lethargic  . Celebrex [Celecoxib]   . Codeine   . Vioxx [Rofecoxib]     ROS: A comprehensive review of systems was negative except for: Ears, nose, mouth, throat, and face: positive for Dry mouth  HOME MEDS: Current Outpatient Prescriptions  Medication Sig Dispense Refill  . aspirin 81 MG tablet Take 1 tablet (81 mg total) by mouth  daily. 30 tablet   . budesonide-formoterol (SYMBICORT) 160-4.5 MCG/ACT inhaler Inhale 2 puffs into the lungs 2 (two) times daily.    Marland Kitchen CALCIUM CITRATE PO Take 1 tablet by mouth daily.     . Cholecalciferol (VITAMIN D3) 2000 UNITS capsule Take 2,000 Units by mouth daily.    . furosemide (LASIX) 20 MG tablet Take 10 mg by mouth daily.     Marland Kitchen gabapentin (NEURONTIN) 100 MG capsule Take 1 capsule (100 mg total) by mouth 3 (three) times daily. 90 capsule 3  . metoprolol tartrate (LOPRESSOR) 50 MG tablet Take 1 tablet (50 mg total) by mouth 2 (two) times daily with a meal. 60 tablet 0  . Multiple Vitamin (MULTIVITAMIN) capsule Take 1 capsule by mouth daily. Contains iron    . rosuvastatin (CRESTOR) 40 MG tablet Take 1 tablet (40 mg total) by mouth daily at 6 PM. 30 tablet 0  . ticagrelor (BRILINTA) 90 MG TABS tablet Take 1 tablet (90 mg total) by mouth 2 (two) times daily. 60 tablet 0  . traMADol (ULTRAM) 50 MG tablet Take 1 tablet (50 mg total) by mouth 5 (five) times daily as needed. 150 tablet 5  . vitamin C (ASCORBIC ACID) 500 MG tablet Take 500 mg by mouth daily.    Marland Kitchen lisinopril (PRINIVIL,ZESTRIL) 10 MG tablet Take 1 tablet (10 mg total) by mouth daily. 90 tablet 3   No current facility-administered medications for this visit.    LABS/IMAGING: No results found for this or any previous visit (from the past 48 hour(s)). No results found.  VITALS: BP 122/74 mmHg  Pulse 76  Ht '5\' 5"'$  (1.651 m)  Wt 156 lb 6.4 oz (70.943 kg)  BMI 26.03 kg/m2  EXAM: General appearance: alert and no distress Neck: no JVD Lungs: clear to auscultation bilaterally Heart: regular rate and rhythm, S1, S2 normal and systolic murmur: early systolic 2/6, crescendo at 2nd right intercostal space Abdomen: soft, non-tender; bowel sounds normal; no masses,  no organomegaly Extremities: edema trace bilateral Pulses: 2+ and symmetric Skin: Skin color, texture, turgor normal. No rashes or lesions Neurologic: Grossly  normal Psych: Seems somewhat pre-occupied  EKG: Normal sinus rhythm at 76  ASSESSMENT: 1. Coronary artery disease status post PCI to the RCA with a 3.0 x 33 mm Xience Alpine drug-eluting stent 2. Hypertension-controlled 3. Dyslipidemia  PLAN: 1.   Mrs. Fryberger did develop coronary artery disease and had acute non-ST elevation MI. She was successfully stented and has had improvement in her chest pain. She would benefit from guideline indication for lisinopril. Renal function is normal and will add 10 mg daily. I think we can also decrease her Lasix to 10 mg daily. Overall she is doing well. We'll continue her current medications and plan to see her back in 6 months.  Pixie Casino, MD, [facility] Attending Cardiologist [facility]  Chrissie Noa  C Hilty 10/16/2014, 2:09 PM

## 2014-10-22 ENCOUNTER — Telehealth (HOSPITAL_COMMUNITY): Payer: Self-pay | Admitting: *Deleted

## 2014-10-22 NOTE — Telephone Encounter (Signed)
Received signed MD order.  Message left for pt to please contact for participation in Cardiac Rehab.  Contact information provided. Cherre Huger, BSN

## 2015-01-18 ENCOUNTER — Other Ambulatory Visit: Payer: Self-pay | Admitting: Registered Nurse

## 2015-01-24 ENCOUNTER — Other Ambulatory Visit: Payer: Self-pay | Admitting: Registered Nurse

## 2015-01-29 ENCOUNTER — Other Ambulatory Visit: Payer: Self-pay | Admitting: Registered Nurse

## 2015-02-03 ENCOUNTER — Telehealth: Payer: Self-pay | Admitting: *Deleted

## 2015-02-03 NOTE — Telephone Encounter (Signed)
Sara Fox called to ask about her appt date and her tramadol.  I left a message on voicemail per [facility]

## 2015-02-05 ENCOUNTER — Ambulatory Visit (HOSPITAL_BASED_OUTPATIENT_CLINIC_OR_DEPARTMENT_OTHER): Payer: Medicare Other | Admitting: Physical Medicine & Rehabilitation

## 2015-02-05 ENCOUNTER — Encounter: Payer: Medicare Other | Attending: Physical Medicine & Rehabilitation

## 2015-02-05 ENCOUNTER — Encounter: Payer: Self-pay | Admitting: Physical Medicine & Rehabilitation

## 2015-02-05 VITALS — BP 145/70 | HR 78

## 2015-02-05 DIAGNOSIS — G8928 Other chronic postprocedural pain: Secondary | ICD-10-CM | POA: Insufficient documentation

## 2015-02-05 DIAGNOSIS — M533 Sacrococcygeal disorders, not elsewhere classified: Secondary | ICD-10-CM

## 2015-02-05 DIAGNOSIS — M24511 Contracture, right shoulder: Secondary | ICD-10-CM | POA: Insufficient documentation

## 2015-02-05 DIAGNOSIS — F17210 Nicotine dependence, cigarettes, uncomplicated: Secondary | ICD-10-CM | POA: Diagnosis not present

## 2015-02-05 DIAGNOSIS — C760 Malignant neoplasm of head, face and neck: Secondary | ICD-10-CM | POA: Diagnosis not present

## 2015-02-05 DIAGNOSIS — R2689 Other abnormalities of gait and mobility: Secondary | ICD-10-CM | POA: Diagnosis not present

## 2015-02-05 DIAGNOSIS — E785 Hyperlipidemia, unspecified: Secondary | ICD-10-CM | POA: Insufficient documentation

## 2015-02-05 DIAGNOSIS — M961 Postlaminectomy syndrome, not elsewhere classified: Secondary | ICD-10-CM | POA: Insufficient documentation

## 2015-02-05 DIAGNOSIS — I10 Essential (primary) hypertension: Secondary | ICD-10-CM | POA: Insufficient documentation

## 2015-02-05 DIAGNOSIS — R269 Unspecified abnormalities of gait and mobility: Secondary | ICD-10-CM | POA: Insufficient documentation

## 2015-02-05 MED ORDER — TRAMADOL HCL 50 MG PO TABS: ORAL_TABLET | ORAL | Status: DC

## 2015-02-05 NOTE — Progress Notes (Signed)
Subjective:    Patient ID: Sara Fox, female    DOB: 02/24/1941, 74 y.o.   MRN: [id number]  HPI 74 year old female with a lumbar fusion in 2012 was had chronic postoperative pain. She's had good relief with sacroiliac injections for a number of months but has had gradual recurrence of pain. Interval history is positive for falls she states that her dogs pull her off balance. She has hit her head against the door once but has not fallen and hit her head on the ground.  Current pain medication is tramadol 50 g 5 times per day. She has not had any side effects such as constipation or sedation with this medication. Pain Inventory Average Pain 5 Pain Right Now 3 My pain is aching  In the last 24 hours, has pain interfered with the following? General activity 3 Relation with others 3 Enjoyment of life 3 What TIME of day is your pain at its worst? daytime Sleep (in general) NA  Pain is worse with: walking and standing Pain improves with: medication Relief from Meds: 7  Mobility walk without assistance how many minutes can you walk? 30 ability to climb steps?  yes do you drive?  yes  Function not employed: date last employed 2000  Neuro/Psych bladder control problems weakness numbness tingling spasms  Prior Studies Any changes since last visit?  no  Physicians involved in your care Any changes since last visit?  no   Family History  Problem Relation Age of Onset  . Heart disease Mother     also HTN  . Diabetes Mother   . Colon cancer Mother   . Heart disease Brother     deceased at 37  . Hypertension Brother   . Heart attack Brother   . Hypertension Brother   . Heart disease Brother    Social History   Social History  . Marital Status: Married    Spouse Name: N/A  . Number of Children: 2  . Years of Education: GED   Occupational History  .     Social History Main Topics  . Smoking status: Current Every Day Smoker -- 1.00 packs/day for 60 years    . Smokeless tobacco: Never Used     Comment: currently smokes 1/2ppd or less  . Alcohol Use: No  . Drug Use: No  . Sexual Activity: Not Asked   Other Topics Concern  . None   Social History Narrative   Past Surgical History  Procedure Laterality Date  . Knee surgery Bilateral 2001 & 2007  . Shoulder surgery Right   . Neck surgery  2012    2012  . Back surgery  2012    lower back  . Cholecystectomy    . Abdominal hysterectomy    . Partial gastrectomy  2005    subtotal  . Transthoracic echocardiogram  06/02/2010    EF=>55%, normal LV systolic function; normal RV systolic function; mild mitral annular calcif; trace TR; AV mildly sclerotic  . Cardiac catheterization  06/1999    noncritical disease invovling PDA  . Cardiac catheterization N/A 09/14/2014    Procedure: Left Heart Cath and Coronary Angiography;  Surgeon: Leonie Man, MD;  Location: [facility] INVASIVE CV LAB CUPID;  Service: Cardiovascular;  Laterality: N/A;  . Percutaneous coronary stent intervention (pci-s)  09/14/2014    Procedure: Percutaneous Coronary Stent Intervention (Pci-S);  Surgeon: Leonie Man, MD;  Location: [facility] INVASIVE CV LAB CUPID;  Service: Cardiovascular;;   Past Medical History  Diagnosis Date  . Hypertension   . Hyperlipidemia   . Stomach problems   . Cervical cancer   . Back pain   . Neck pain    BP 145/70 mmHg  Pulse 78  SpO2 98%  Opioid Risk Score:   Fall Risk Score:  `1  Depression screen PHQ 2/9  Depression screen [facility] 2/9 02/05/2015 08/06/2014  Decreased Interest 0 0  Down, Depressed, Hopeless 0 0  PHQ - 2 Score 0 0  Altered sleeping - 1  Tired, decreased energy - 2  Change in appetite - 1  Feeling bad or failure about yourself  - 0  Trouble concentrating - 0  Moving slowly or fidgety/restless - 0  Suicidal thoughts - 0  PHQ-9 Score - 4     Review of Systems  Constitutional: Positive for diaphoresis.  Cardiovascular: Positive for leg swelling.  Gastrointestinal: Positive  for diarrhea.  Genitourinary: Positive for difficulty urinating.  Musculoskeletal:       Spasms  Neurological:       Spasms  All other systems reviewed and are negative.      Objective:   Physical Exam  Constitutional: She is oriented to person, place, and time. She appears well-developed and well-nourished.  HENT:  Head: Normocephalic and atraumatic.  Eyes: Conjunctivae and EOM are normal. Pupils are equal, round, and reactive to light.  Neurological: She is alert and oriented to person, place, and time.  Psychiatric: She has a normal mood and affect.  Nursing note and vitals reviewed.   Patient with limited range of motion at her shoulders abducts to 80 on the right side and 95 on the left side. Finger-nose-finger testing has no evidence of dysmetria if she goes up to high the shoulder range of motion becomes a limited factor. She ambulates with small step length and no evidence of toe drag or knee instability she is for flex at the hips as well as kyphotic in the upper spine she has decreased bilateral arm swing Motor strength is 4/5 bilateral hip flexor and extensor and ankle dorsiflexor. Upper extreme strength limited range of motion at bilateral shoulders she does have 4 minus at the biceps triceps and grip bilaterally.      Assessment & Plan:  1. 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 She has had good relief with sacroiliac injections will schedule for repeat  Continue tramadol does not have any evidence of significant side effects or misuse 2. 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 To be mainly musculoskeletal issues she has decreased run range of motion lumbar spine. She also has contracture right shoulder

## 2015-02-11 ENCOUNTER — Ambulatory Visit: Payer: Self-pay | Admitting: Physical Medicine & Rehabilitation

## 2015-03-02 ENCOUNTER — Telehealth: Payer: Self-pay | Admitting: *Deleted

## 2015-03-02 NOTE — Telephone Encounter (Signed)
Faxed info to [facility] nurse case manager - patient's last BP, HR, height, weight, EF, allergies, med list  Fax # [phone]

## 2015-03-18 ENCOUNTER — Encounter: Payer: Medicare Other | Attending: Physical Medicine & Rehabilitation

## 2015-03-18 ENCOUNTER — Encounter: Payer: Self-pay | Admitting: Physical Medicine & Rehabilitation

## 2015-03-18 ENCOUNTER — Ambulatory Visit (HOSPITAL_BASED_OUTPATIENT_CLINIC_OR_DEPARTMENT_OTHER): Payer: Medicare Other | Admitting: Physical Medicine & Rehabilitation

## 2015-03-18 VITALS — BP 144/50 | HR 77 | Resp 14

## 2015-03-18 DIAGNOSIS — M961 Postlaminectomy syndrome, not elsewhere classified: Secondary | ICD-10-CM | POA: Diagnosis not present

## 2015-03-18 DIAGNOSIS — G8928 Other chronic postprocedural pain: Secondary | ICD-10-CM | POA: Diagnosis not present

## 2015-03-18 DIAGNOSIS — E785 Hyperlipidemia, unspecified: Secondary | ICD-10-CM | POA: Insufficient documentation

## 2015-03-18 DIAGNOSIS — M533 Sacrococcygeal disorders, not elsewhere classified: Secondary | ICD-10-CM | POA: Insufficient documentation

## 2015-03-18 DIAGNOSIS — I10 Essential (primary) hypertension: Secondary | ICD-10-CM | POA: Diagnosis not present

## 2015-03-18 DIAGNOSIS — R2689 Other abnormalities of gait and mobility: Secondary | ICD-10-CM | POA: Insufficient documentation

## 2015-03-18 DIAGNOSIS — C760 Malignant neoplasm of head, face and neck: Secondary | ICD-10-CM | POA: Diagnosis not present

## 2015-03-18 DIAGNOSIS — F17210 Nicotine dependence, cigarettes, uncomplicated: Secondary | ICD-10-CM | POA: Diagnosis not present

## 2015-03-18 DIAGNOSIS — M24511 Contracture, right shoulder: Secondary | ICD-10-CM | POA: Insufficient documentation

## 2015-03-18 MED ORDER — TRAMADOL HCL 50 MG PO TABS: ORAL_TABLET | ORAL | Status: DC

## 2015-03-18 NOTE — Patient Instructions (Signed)
The sacroiliac injections can be performed every 3 months. I will see back in 3 months to repeat

## 2015-03-18 NOTE — Progress Notes (Signed)
PROCEDURE RECORD [facility]   Name: Sara Fox DOB:January 17, 1941 MRN: [id number]  Date:03/18/2015  Physician: Claudette Laws, MD    Nurse/CMA: Purvis Sheffield  Allergies:  Allergies  Allergen Reactions  . Morphine And Related Shortness Of Breath    Throat swelling/trouble breathing and lethargic  . Celebrex [Celecoxib]   . Codeine   . Vioxx [Rofecoxib]     Consent Signed: Yes.    Is patient diabetic? No.  CBG today?  Pregnant: No. LMP: No LMP recorded. Patient has had a hysterectomy. (age 89-55)  Anticoagulants: no Anti-inflammatory: no Antibiotics: no  Procedure: bilateral sacroiliac steroid injection  Position: Prone Start Time: 12:15pm End Time: 12:22pm  Fluoro Time: 22  RN/CMA Teresa Coombs Tian Davison    Time 11:57am 12:30pm    BP 144/50 164/72    Pulse 77 77    Respirations 14 14    O2 Sat 99 98    S/S 6 6    Pain Level 8/10 7/10     D/C home with son, [name] & O X 3, D/C instructions reviewed, and sits independently.

## 2015-03-18 NOTE — Progress Notes (Signed)
Bilateral sacroiliac injections under fluoroscopic guidance  Indication: Low back and buttocks pain not relieved by medication management and other conservative care.  Informed consent was obtained after describing risks and benefits of the procedure with the patient, this includes bleeding, bruising, infection, paralysis and medication side effects. The patient wishes to proceed and has given written consent. The patient was placed in a prone position. The lumbar and sacral area was marked and prepped with Betadine. A 25-gauge 1-1/2 inch needle was inserted into the skin and subcutaneous tissue and 1 mL of 1% lidocaine was injected into each side. Then a 25-gauge 3 inch spinal needle was inserted under fluoroscopic guidance into the left sacroiliac joint. AP and lateral images were utilized. Omnipaque 180x0.5 mL under live fluoroscopy demonstrated no intravascular uptake. Then a solution containing one ML of celestone 6mg was injected plus marcaine .25% 4mL, 2.5ml total volume. This same procedure was re peated on the right side using the same needle, injectate, and technique. Patient tolerated the procedure well. Post procedure instructions were given. Please see post procedure form. 

## 2015-04-06 ENCOUNTER — Other Ambulatory Visit: Payer: Self-pay

## 2015-05-06 ENCOUNTER — Other Ambulatory Visit: Payer: Self-pay | Admitting: Physical Medicine & Rehabilitation

## 2015-06-18 ENCOUNTER — Encounter: Payer: Medicare Other | Attending: Physical Medicine & Rehabilitation

## 2015-06-18 ENCOUNTER — Ambulatory Visit: Payer: Medicare Other | Admitting: Physical Medicine & Rehabilitation

## 2015-06-18 DIAGNOSIS — M533 Sacrococcygeal disorders, not elsewhere classified: Secondary | ICD-10-CM | POA: Insufficient documentation

## 2015-06-18 DIAGNOSIS — M24511 Contracture, right shoulder: Secondary | ICD-10-CM | POA: Insufficient documentation

## 2015-06-18 DIAGNOSIS — E785 Hyperlipidemia, unspecified: Secondary | ICD-10-CM | POA: Insufficient documentation

## 2015-06-18 DIAGNOSIS — I10 Essential (primary) hypertension: Secondary | ICD-10-CM | POA: Insufficient documentation

## 2015-06-18 DIAGNOSIS — R2689 Other abnormalities of gait and mobility: Secondary | ICD-10-CM | POA: Insufficient documentation

## 2015-06-18 DIAGNOSIS — G8928 Other chronic postprocedural pain: Secondary | ICD-10-CM | POA: Insufficient documentation

## 2015-06-18 DIAGNOSIS — F17210 Nicotine dependence, cigarettes, uncomplicated: Secondary | ICD-10-CM | POA: Insufficient documentation

## 2015-06-18 DIAGNOSIS — C760 Malignant neoplasm of head, face and neck: Secondary | ICD-10-CM | POA: Insufficient documentation

## 2015-06-18 DIAGNOSIS — M961 Postlaminectomy syndrome, not elsewhere classified: Secondary | ICD-10-CM | POA: Insufficient documentation

## 2015-06-23 DIAGNOSIS — I5032 Chronic diastolic (congestive) heart failure: Secondary | ICD-10-CM | POA: Diagnosis not present

## 2015-06-23 DIAGNOSIS — H6123 Impacted cerumen, bilateral: Secondary | ICD-10-CM | POA: Diagnosis not present

## 2015-06-23 DIAGNOSIS — E7800 Pure hypercholesterolemia, unspecified: Secondary | ICD-10-CM | POA: Diagnosis not present

## 2015-06-23 DIAGNOSIS — Z720 Tobacco use: Secondary | ICD-10-CM | POA: Diagnosis not present

## 2015-06-23 DIAGNOSIS — Z0000 Encounter for general adult medical examination without abnormal findings: Secondary | ICD-10-CM | POA: Diagnosis not present

## 2015-06-23 DIAGNOSIS — E559 Vitamin D deficiency, unspecified: Secondary | ICD-10-CM | POA: Diagnosis not present

## 2015-06-23 DIAGNOSIS — I10 Essential (primary) hypertension: Secondary | ICD-10-CM | POA: Diagnosis not present

## 2015-06-23 DIAGNOSIS — Z79899 Other long term (current) drug therapy: Secondary | ICD-10-CM | POA: Diagnosis not present

## 2015-06-24 DIAGNOSIS — Z0000 Encounter for general adult medical examination without abnormal findings: Secondary | ICD-10-CM | POA: Diagnosis not present

## 2015-06-24 DIAGNOSIS — I10 Essential (primary) hypertension: Secondary | ICD-10-CM | POA: Diagnosis not present

## 2015-06-24 DIAGNOSIS — H6123 Impacted cerumen, bilateral: Secondary | ICD-10-CM | POA: Diagnosis not present

## 2015-06-30 DIAGNOSIS — H6123 Impacted cerumen, bilateral: Secondary | ICD-10-CM | POA: Diagnosis not present

## 2015-07-01 ENCOUNTER — Telehealth: Payer: Self-pay | Admitting: *Deleted

## 2015-07-01 NOTE — Telephone Encounter (Signed)
Pt called asking when her next appointment is. The patient no-showed her appt on 06/18/2015 and currently does not have an appt scheduled.  I called the patient and left a message to have her call our office and make a new appt.

## 2015-07-13 ENCOUNTER — Other Ambulatory Visit: Payer: Self-pay | Admitting: Physical Medicine & Rehabilitation

## 2015-07-20 ENCOUNTER — Other Ambulatory Visit: Payer: Self-pay | Admitting: Physical Medicine & Rehabilitation

## 2015-07-21 ENCOUNTER — Other Ambulatory Visit: Payer: Self-pay | Admitting: Physical Medicine & Rehabilitation

## 2015-07-23 ENCOUNTER — Ambulatory Visit: Payer: Medicare Other | Admitting: Physical Medicine & Rehabilitation

## 2015-07-23 ENCOUNTER — Telehealth: Payer: Self-pay | Admitting: *Deleted

## 2015-07-23 NOTE — Telephone Encounter (Signed)
A fax was sent to [name] Chard MD requesting Synetta Shadow be taken off her Brillinta prior to injection.  Pre procedure instructions were reviewed with Ashanna today.  Her scheduled sacroiliac steroid injection for today had to be rescheduleid due to she had not stopped medication.

## 2015-07-28 ENCOUNTER — Other Ambulatory Visit: Payer: Self-pay | Admitting: Physical Medicine & Rehabilitation

## 2015-08-04 NOTE — Telephone Encounter (Signed)
Attempted to reach Sara Fox.  Home number cannot take messages because mailbox is full.  I left message on mobile # to call the office so that we may confirm she has stopped blood thinner

## 2015-08-05 NOTE — Telephone Encounter (Signed)
I spoke with Sara Fox.  She stopped her blood thinner on 07/31/15 for her injection.

## 2015-08-06 ENCOUNTER — Encounter: Payer: Medicare Other | Attending: Physical Medicine & Rehabilitation

## 2015-08-06 ENCOUNTER — Ambulatory Visit (HOSPITAL_BASED_OUTPATIENT_CLINIC_OR_DEPARTMENT_OTHER): Payer: Medicare Other | Admitting: Physical Medicine & Rehabilitation

## 2015-08-06 ENCOUNTER — Encounter: Payer: Self-pay | Admitting: Physical Medicine & Rehabilitation

## 2015-08-06 VITALS — BP 131/55 | HR 89 | Resp 14

## 2015-08-06 DIAGNOSIS — M961 Postlaminectomy syndrome, not elsewhere classified: Secondary | ICD-10-CM | POA: Diagnosis not present

## 2015-08-06 DIAGNOSIS — C760 Malignant neoplasm of head, face and neck: Secondary | ICD-10-CM | POA: Diagnosis not present

## 2015-08-06 DIAGNOSIS — R2689 Other abnormalities of gait and mobility: Secondary | ICD-10-CM | POA: Diagnosis not present

## 2015-08-06 DIAGNOSIS — M24511 Contracture, right shoulder: Secondary | ICD-10-CM | POA: Insufficient documentation

## 2015-08-06 DIAGNOSIS — G8928 Other chronic postprocedural pain: Secondary | ICD-10-CM | POA: Insufficient documentation

## 2015-08-06 DIAGNOSIS — M533 Sacrococcygeal disorders, not elsewhere classified: Secondary | ICD-10-CM | POA: Insufficient documentation

## 2015-08-06 DIAGNOSIS — F17210 Nicotine dependence, cigarettes, uncomplicated: Secondary | ICD-10-CM | POA: Diagnosis not present

## 2015-08-06 DIAGNOSIS — I10 Essential (primary) hypertension: Secondary | ICD-10-CM | POA: Insufficient documentation

## 2015-08-06 DIAGNOSIS — E785 Hyperlipidemia, unspecified: Secondary | ICD-10-CM | POA: Diagnosis not present

## 2015-08-06 MED ORDER — TRAMADOL HCL 50 MG PO TABS: 50.0000 mg | ORAL_TABLET | Freq: Four times a day (QID) | ORAL | Status: DC | PRN

## 2015-08-06 NOTE — Progress Notes (Signed)
PROCEDURE RECORD [facility]   Name: Sara Fox DOB:1941-04-15 MRN: [id number]  Date:08/06/2015  Physician: Claudette Laws, MD    Nurse/CMA: Purvis Sheffield  Allergies:  Allergies  Allergen Reactions  . Morphine And Related Shortness Of Breath    Throat swelling/trouble breathing and lethargic  . Celebrex [Celecoxib]   . Codeine   . Vioxx [Rofecoxib]     Consent Signed: Yes.    Is patient diabetic? No.  CBG today? N/A   Pregnant: No. LMP: No LMP recorded. Patient has had a hysterectomy. (age [age])  Anticoagulants: no Anti-inflammatory: no Antibiotics: no  Procedure: bilateral sacroiliac steroid injection  Position: Prone Start Time: 9:50am  End Time: 9:55am  Fluoro Time: 17  RN/CMA Caedin Mogan, CMA Luis Nickles, CMA    Time 9:35am 9:58am    BP 131/55 132/82    Pulse 89 82    Respirations 14 14    O2 Sat 97 99    S/S 6 6    Pain Level 7/10 5/10     D/C home with son, [name] & O X 3, D/C instructions reviewed, and sits independently.

## 2015-08-06 NOTE — Progress Notes (Signed)
Bilateral sacroiliac injections under fluoroscopic guidance  Indication: Low back and buttocks pain not relieved by medication management and other conservative care.  Informed consent was obtained after describing risks and benefits of the procedure with the patient, this includes bleeding, bruising, infection, paralysis and medication side effects. The patient wishes to proceed and has given written consent. The patient was placed in a prone position. The lumbar and sacral area was marked and prepped with Betadine. A 25-gauge 1-1/2 inch needle was inserted into the skin and subcutaneous tissue and 1 mL of 1% lidocaine was injected into each side. Then a 25-gauge 3 inch spinal needle was inserted under fluoroscopic guidance into the left sacroiliac joint. AP and lateral images were utilized. Omnipaque 180x0.5 mL under live fluoroscopy demonstrated no intravascular uptake. Then a solution containing one ML of celestone '6mg'$  was injected plus lidocaine % 17m, 1.547mtotal volume. This same procedure was re peated on the right side using the same needle, injectate, and technique. Patient tolerated the procedure well. Post procedure instructions were given. Please see post procedure form.

## 2015-08-06 NOTE — Patient Instructions (Signed)
Sacroiliac injection was performed today. A combination of a naming medicine plus a cortisone medicine was injected. The injection was done under x-ray guidance. This procedure has been performed to help reduce low back and buttocks pain as well as potentially hip pain. The duration of this injection is variable lasting from hours to  Months. It may repeated if needed. 

## 2015-09-09 DIAGNOSIS — J3489 Other specified disorders of nose and nasal sinuses: Secondary | ICD-10-CM | POA: Diagnosis not present

## 2015-09-09 DIAGNOSIS — H6123 Impacted cerumen, bilateral: Secondary | ICD-10-CM | POA: Diagnosis not present

## 2015-09-09 DIAGNOSIS — R42 Dizziness and giddiness: Secondary | ICD-10-CM | POA: Diagnosis not present

## 2015-09-10 ENCOUNTER — Other Ambulatory Visit: Payer: Self-pay | Admitting: Physical Medicine & Rehabilitation

## 2015-11-04 ENCOUNTER — Encounter (INDEPENDENT_AMBULATORY_CARE_PROVIDER_SITE_OTHER): Payer: Medicare Other | Admitting: Podiatry

## 2015-11-04 NOTE — Progress Notes (Signed)
This encounter was created in error - please disregard.

## 2015-11-05 ENCOUNTER — Encounter: Payer: Medicare Other | Attending: Physical Medicine & Rehabilitation

## 2015-11-05 ENCOUNTER — Ambulatory Visit: Payer: Medicare Other | Admitting: Physical Medicine & Rehabilitation

## 2015-11-13 ENCOUNTER — Other Ambulatory Visit: Payer: Self-pay | Admitting: Internal Medicine

## 2015-12-02 ENCOUNTER — Encounter: Payer: Self-pay | Admitting: Podiatry

## 2015-12-02 ENCOUNTER — Ambulatory Visit (INDEPENDENT_AMBULATORY_CARE_PROVIDER_SITE_OTHER): Payer: Medicare Other | Admitting: Podiatry

## 2015-12-02 VITALS — BP 126/61 | HR 82 | Resp 16

## 2015-12-02 DIAGNOSIS — B351 Tinea unguium: Secondary | ICD-10-CM

## 2015-12-02 DIAGNOSIS — M79676 Pain in unspecified toe(s): Secondary | ICD-10-CM

## 2015-12-02 NOTE — Progress Notes (Signed)
   Subjective:    Patient ID: Sara Fox, female    DOB: [birth year], 75 y.o.   MRN: [id number]  HPI: She presents today with a chief complaint of painful elongated toenails. She states they are painful when she walks and she cannot cut them because of the thickness and the pain.    Review of Systems  Musculoskeletal: Positive for arthralgias.  Skin: Positive for color change.  All other systems reviewed and are negative.      Objective:   Physical Exam: Vital signs are stable she is alert and oriented 3 pulses are palpable. Neurologic sensorium is intact. Deep tendon reflexes are intact muscle strength is normal. Cutaneous evaluation demonstrates grossly elongated neglected toenails growing over and into the skin not causing any bacterial infection visible yet.        Assessment & Plan:  Pain in limb secondary to onychomycosis and neglect.  Plan: Debridement of toenails 1 through 5 bilateral. Follow up with her in 2-3 months for reevaluation and debridement.

## 2015-12-03 ENCOUNTER — Encounter: Payer: Medicare Other | Attending: Physical Medicine & Rehabilitation

## 2015-12-03 ENCOUNTER — Encounter: Payer: Self-pay | Admitting: Physical Medicine & Rehabilitation

## 2015-12-03 ENCOUNTER — Ambulatory Visit (HOSPITAL_BASED_OUTPATIENT_CLINIC_OR_DEPARTMENT_OTHER): Payer: Medicare Other | Admitting: Physical Medicine & Rehabilitation

## 2015-12-03 VITALS — BP 129/70 | HR 64 | Resp 14

## 2015-12-03 DIAGNOSIS — M533 Sacrococcygeal disorders, not elsewhere classified: Secondary | ICD-10-CM | POA: Diagnosis not present

## 2015-12-03 DIAGNOSIS — G8929 Other chronic pain: Secondary | ICD-10-CM | POA: Diagnosis not present

## 2015-12-03 DIAGNOSIS — Z981 Arthrodesis status: Secondary | ICD-10-CM | POA: Insufficient documentation

## 2015-12-03 DIAGNOSIS — M545 Low back pain, unspecified: Secondary | ICD-10-CM

## 2015-12-03 MED ORDER — GABAPENTIN 100 MG PO CAPS: 100.0000 mg | ORAL_CAPSULE | Freq: Three times a day (TID) | ORAL | Status: DC

## 2015-12-03 NOTE — Patient Instructions (Signed)
Please stop Brillinta 5 days prior to injection May continue Aspirin

## 2015-12-03 NOTE — Progress Notes (Signed)
Subjective:    Patient ID: Sara Fox, female    DOB: [birth year], 75 y.o.   MRN: [id number]  HPI 75 year old female with history of lumbar fusion. She has sacroiliac dysfunction, bilateral, related to this. She's had 2 sets of sacroiliac injections done under fluoroscopic guidance. Last injection was performed on 08/06/2015. Patient was doing quite well until her dog caused her to fall about 2 weeks ago.  Patient does well on her gabapentin and tramadol combination. She gets some knee pain and leg pain which interferes with walking. If she does not take these medications.  Pain Inventory Average Pain 6 Pain Right Now 7 My pain is dull and tingling  In the last 24 hours, has pain interfered with the following? General activity 4 Relation with others 4 Enjoyment of life 2 What TIME of day is your pain at its worst? daytime, night Sleep (in general) Fair  Pain is worse with: walking and standing Pain improves with: rest, heat/ice, medication, TENS and injections Relief from Meds: 8  Mobility walk without assistance how many minutes can you walk? 15-30 ability to climb steps?  yes do you drive?  yes  Function not employed: date last employed 2001  Neuro/Psych bladder control problems tremor tingling dizziness  Prior Studies Any changes since last visit?  no  Physicians involved in your care Any changes since last visit?  no   Family History  Problem Relation Age of Onset  . Heart disease Mother     also HTN  . Diabetes Mother   . Colon cancer Mother   . Heart disease Brother     deceased at 55  . Hypertension Brother   . Heart attack Brother   . Hypertension Brother   . Heart disease Brother    Social History   Social History  . Marital Status: Married    Spouse Name: N/A  . Number of Children: 2  . Years of Education: GED   Occupational History  .     Social History Main Topics  . Smoking status: Current Every Day Smoker -- 1.00 packs/day for  60 years  . Smokeless tobacco: Never Used     Comment: currently smokes 1/2ppd or less  . Alcohol Use: No  . Drug Use: No  . Sexual Activity: Not Asked   Other Topics Concern  . None   Social History Narrative   Past Surgical History  Procedure Laterality Date  . Knee surgery Bilateral 2001 & 2007  . Shoulder surgery Right   . Neck surgery  2012    2012  . Back surgery  2012    lower back  . Cholecystectomy    . Abdominal hysterectomy    . Partial gastrectomy  2005    subtotal  . Transthoracic echocardiogram  06/02/2010    EF=>55%, normal LV systolic function; normal RV systolic function; mild mitral annular calcif; trace TR; AV mildly sclerotic  . Cardiac catheterization  06/1999    noncritical disease invovling PDA  . Cardiac catheterization N/A 09/14/2014    Procedure: Left Heart Cath and Coronary Angiography;  Surgeon: Leonie Man, MD;  Location: [facility] INVASIVE CV LAB CUPID;  Service: Cardiovascular;  Laterality: N/A;  . Percutaneous coronary stent intervention (pci-s)  09/14/2014    Procedure: Percutaneous Coronary Stent Intervention (Pci-S);  Surgeon: Leonie Man, MD;  Location: [facility] INVASIVE CV LAB CUPID;  Service: Cardiovascular;;   Past Medical History  Diagnosis Date  . Hypertension   . Hyperlipidemia   .  Stomach problems   . Cervical cancer ([place])   . Back pain   . Neck pain    BP 129/70 mmHg  Pulse 64  Resp 14  SpO2 96%  Opioid Risk Score:   Fall Risk Score:  `1  Depression screen PHQ 2/9  Depression screen [facility] 2/9 02/05/2015 08/06/2014  Decreased Interest 0 0  Down, Depressed, Hopeless 0 0  PHQ - 2 Score 0 0  Altered sleeping 0 1  Tired, decreased energy 1 2  Change in appetite 0 1  Feeling bad or failure about yourself  0 0  Trouble concentrating 1 0  Moving slowly or fidgety/restless 0 0  Suicidal thoughts 0 0  PHQ-9 Score 2 4     Review of Systems  Constitutional: Positive for diaphoresis and unexpected weight change.  Eyes: Negative.     Respiratory: Positive for shortness of breath.   Cardiovascular: Negative.   Gastrointestinal: Positive for diarrhea and constipation.  Endocrine: Negative.   Genitourinary: Positive for difficulty urinating.  Allergic/Immunologic: Negative.   Neurological: Positive for dizziness and tremors.       Tingling  Hematological: Bruises/bleeds easily.  Psychiatric/Behavioral: Negative.   All other systems reviewed and are negative.      Objective:   Physical Exam  Constitutional: She is oriented to person, place, and time. She appears well-developed and well-nourished.  HENT:  Head: Normocephalic and atraumatic.  Eyes: Conjunctivae and EOM are normal. Pupils are equal, round, and reactive to light.  Neurological: She is alert and oriented to person, place, and time.  Psychiatric: She has a normal mood and affect.  Nursing note and vitals reviewed.   Negative straight leg raise. No pain to palpation around the knees. She does have mildly diminished knee extension bilaterally. Lumbar spine has no evidence of scoliosis. She has tenderness palpation below the belt line around the PSIS area. Local summary strength is 5/5 bilateral hip flexor, knee extensor, ankle dorsiflexor and plantar flexor. Standing balance is good      Assessment & Plan:  1.  Bilateral sacroiliac dysfunction post fusion,  She had a good prolonged relief with sacroiliac injections, approximately 3.5 months. Will schedule for reinjection. On Brillinta. Cardiac stents placed 15-16 months ago. Hold for 5 days prior to injection, may continue aspirin 81 mg per day.  2. Continue tramadol for pain, 50 mg 5 times per day, no signs of misuse Have reordered gabapentin 100 mg 3 times a day

## 2015-12-22 DIAGNOSIS — R197 Diarrhea, unspecified: Secondary | ICD-10-CM | POA: Diagnosis not present

## 2015-12-22 DIAGNOSIS — I10 Essential (primary) hypertension: Secondary | ICD-10-CM | POA: Diagnosis not present

## 2015-12-22 DIAGNOSIS — Z720 Tobacco use: Secondary | ICD-10-CM | POA: Diagnosis not present

## 2015-12-22 DIAGNOSIS — E7800 Pure hypercholesterolemia, unspecified: Secondary | ICD-10-CM | POA: Diagnosis not present

## 2016-01-14 ENCOUNTER — Encounter: Payer: Medicare Other | Attending: Physical Medicine & Rehabilitation

## 2016-01-14 ENCOUNTER — Ambulatory Visit (HOSPITAL_BASED_OUTPATIENT_CLINIC_OR_DEPARTMENT_OTHER): Payer: Medicare Other | Admitting: Physical Medicine & Rehabilitation

## 2016-01-14 ENCOUNTER — Encounter: Payer: Self-pay | Admitting: Physical Medicine & Rehabilitation

## 2016-01-14 VITALS — BP 162/82 | HR 75 | Resp 14

## 2016-01-14 DIAGNOSIS — M533 Sacrococcygeal disorders, not elsewhere classified: Secondary | ICD-10-CM | POA: Diagnosis not present

## 2016-01-14 DIAGNOSIS — M545 Low back pain: Secondary | ICD-10-CM | POA: Diagnosis not present

## 2016-01-14 DIAGNOSIS — Z981 Arthrodesis status: Secondary | ICD-10-CM | POA: Diagnosis not present

## 2016-01-14 MED ORDER — TRAMADOL HCL 50 MG PO TABS: 50.0000 mg | ORAL_TABLET | Freq: Four times a day (QID) | ORAL | 5 refills | Status: DC | PRN

## 2016-01-14 NOTE — Patient Instructions (Signed)
do not take ibuprofen. Given your history of coronary artery disease and being on Brillinta Increased number of tablets. Of Tramadol. In case she might need to take 2 tablets on some occasions

## 2016-01-14 NOTE — Progress Notes (Signed)
PROCEDURE RECORD [facility]   Name: Sara Fox DOB:1940/12/06 MRN: [id number]  Date:01/14/2016  Physician: Claudette Laws, MD    Nurse/CMA: Debbe Crumble, CMA  Allergies:  Allergies  Allergen Reactions  . Buprenorphine Hcl Shortness Of Breath    Throat swelling/trouble breathing and lethargic  . Morphine And Related Shortness Of Breath    Throat swelling/trouble breathing and lethargic  . Celebrex [Celecoxib]   . Codeine   . Vioxx [Rofecoxib]     Consent Signed: Yes.    Is patient diabetic? No.  CBG today?   Pregnant: No. LMP: No LMP recorded. Patient has had a hysterectomy. (age 71-55)  Anticoagulants: no Anti-inflammatory: no Antibiotics: no  Procedure: bilateral sacroiliac steroid injection  Position: Prone Start Time:10:50 am  End Time: 10:56am  Fluoro Time: 15  RN/CMA Lelaina Oatis, CMA Sheryle Vice, CMA    Time 10:30am 10:5    BP 162/87 193/81    Pulse 75 83    Respirations 14 14    O2 Sat 96 97    S/S 6 6    Pain Level 8/10 3/10     D/C home with daughter, [name] & O X 3, D/C instructions reviewed, and sits independently.

## 2016-01-14 NOTE — Progress Notes (Signed)
Bilateral sacroiliac injections under fluoroscopic guidance  Indication: Low back and buttocks pain not relieved by medication management and other conservative care.  Informed consent was obtained after describing risks and benefits of the procedure with the patient, this includes bleeding, bruising, infection, paralysis and medication side effects. The patient wishes to proceed and has given written consent. The patient was placed in a prone position. The lumbar and sacral area was marked and prepped with Betadine. A 25-gauge 1-1/2 inch needle was inserted into the skin and subcutaneous tissue and 1 mL of 1% lidocaine was injected into each side. Then a 25-gauge 3 inch spinal needle was inserted under fluoroscopic guidance into the left sacroiliac joint. AP and lateral images were utilized. Omnipaque 180x0.5 mL under live fluoroscopy demonstrated no intravascular uptake. Then a solution containing one ML of celestone '6mg'$  was injected plus lidocaine % 56m, 1.56mtotal volume. This same procedure was re peated on the right side using the same needle, injectate, and technique. Patient tolerated the procedure well. Post procedure instructions were given. Please see post procedure form.  Patient states she's been taking ibuprofen at times for her pain. We discussed this is not good given her history of coronary artery disease and taking Brillinta, will allow up to 6 tramadol per day

## 2016-02-11 ENCOUNTER — Encounter: Payer: Self-pay | Admitting: Podiatry

## 2016-02-11 ENCOUNTER — Ambulatory Visit (INDEPENDENT_AMBULATORY_CARE_PROVIDER_SITE_OTHER): Payer: Medicare Other | Admitting: Podiatry

## 2016-02-11 VITALS — Ht 65.0 in | Wt 156.0 lb

## 2016-02-11 DIAGNOSIS — M2041 Other hammer toe(s) (acquired), right foot: Secondary | ICD-10-CM

## 2016-02-11 DIAGNOSIS — B351 Tinea unguium: Secondary | ICD-10-CM

## 2016-02-11 DIAGNOSIS — M79676 Pain in unspecified toe(s): Secondary | ICD-10-CM

## 2016-02-11 NOTE — Progress Notes (Signed)
Patient ID: Sara Fox, female   DOB: 09-04-1940, 75 y.o.   MRN: [id number] Complaint:  Visit Type: Patient returns to my office for continued preventative foot care services. Complaint: Patient states" my nails have grown long and thick and become painful to walk and wear shoes" . The patient presents for preventative foot care services. No changes to ROS  Podiatric Exam: Vascular: dorsalis pedis and posterior tibial pulses are palpable bilateral. Capillary return is immediate. Temperature gradient is WNL. Skin turgor WNL  Sensorium: Normal Semmes Weinstein monofilament test. Normal tactile sensation bilaterally. Nail Exam: Pt has thick disfigured discolored nails with subungual debris noted bilateral entire nail hallux through fifth toenails Ulcer Exam: There is no evidence of ulcer or pre-ulcerative changes or infection. Orthopedic Exam: Muscle tone and strength are WNL. No limitations in general ROM. No crepitus or effusions noted. Foot type and digits show no abnormalities. Bony prominences are unremarkable. Painful hammer toes 2,3 right foot Skin: No Porokeratosis. No infection or ulcers.  Distal clavi.  Diagnosis:  Onychomycosis, , Pain in right toe, pain in left toes  Treatment & Plan Procedures and Treatment: Consent by patient was obtained for treatment procedures. The patient understood the discussion of treatment and procedures well. All questions were answered thoroughly reviewed. Debridement of mycotic and hypertrophic toenails, 1 through 5 bilateral and clearing of subungual debris. No ulceration, no infection noted. Debride clavi. Return Visit-Office Procedure: Patient instructed to return to the office for a follow up visit 3 months for continued evaluation and treatment.    Gardiner Barefoot DPM

## 2016-02-11 NOTE — Progress Notes (Signed)
Patient ID: Sara Fox, female   DOB: Dec 17, 1940, 75 y.o.   MRN: [id number]  .rov1

## 2016-03-15 ENCOUNTER — Other Ambulatory Visit: Payer: Self-pay | Admitting: Internal Medicine

## 2016-03-15 ENCOUNTER — Other Ambulatory Visit: Payer: Self-pay | Admitting: Physical Medicine & Rehabilitation

## 2016-03-15 NOTE — Telephone Encounter (Signed)
Rx(s) sent to pharmacy electronically.  

## 2016-04-12 ENCOUNTER — Encounter: Payer: Self-pay | Admitting: Internal Medicine

## 2016-04-12 ENCOUNTER — Ambulatory Visit (INDEPENDENT_AMBULATORY_CARE_PROVIDER_SITE_OTHER): Payer: Medicare Other | Admitting: Internal Medicine

## 2016-04-12 VITALS — BP 144/78 | HR 76 | Ht 65.0 in | Wt 145.0 lb

## 2016-04-12 DIAGNOSIS — I10 Essential (primary) hypertension: Secondary | ICD-10-CM | POA: Diagnosis not present

## 2016-04-12 DIAGNOSIS — I2510 Atherosclerotic heart disease of native coronary artery without angina pectoris: Secondary | ICD-10-CM

## 2016-04-12 DIAGNOSIS — I214 Non-ST elevation (NSTEMI) myocardial infarction: Secondary | ICD-10-CM | POA: Diagnosis not present

## 2016-04-12 DIAGNOSIS — I5032 Chronic diastolic (congestive) heart failure: Secondary | ICD-10-CM | POA: Diagnosis not present

## 2016-04-12 MED ORDER — LISINOPRIL 10 MG PO TABS: 10.0000 mg | ORAL_TABLET | Freq: Every day | ORAL | 0 refills | Status: DC

## 2016-04-12 NOTE — Progress Notes (Signed)
OFFICE NOTE  Chief Complaint:  Routine follow-up  Primary Care Physician: Maximino Greenland, MD  HPI:  Sara Fox is a 75 year old female with history of multiple surgeries in the past including knee replacement, shoulder surgery, subtotal gastrectomy, and recently she underwent back surgery. She has actually done very well with all these surgeries; has had no cardiac complications, MIs, or any other significant issues. She did have an echocardiogram which showed an EF of 55% recently with mild diastolic dysfunction. There are family risk factors for coronary disease, but she is on appropriate medications including aspirin and a statin. Recently she underwent a Corus gene test in your office which was abnormal, demonstrating a score of 20, which indicates a 30% likelihood of obstructive coronary disease. She remains asymptomatic from a cardiac standpoint and I did not feel additional work-up was necessary.  Her main concern today is caring for her husband who has been pretty sick. She reports her blood pressures been under good control. She is currently taking lovastatin and her cholesterol is at goal.  She does report a trace amount of lower extremity edema from time to time.  I saw Sara Fox back today. On fortunately she presented to the hospital with chest pain and was found to have non-STEMI. She underwent cardiac catheterization by Dr. Ellyn Hack in had a severe mid to distal RCA stenosis. She had a placement of a long 33 mm drug-eluting stent which was designed to [place]. She tolerated this well his had no further chest pain. She is currently on aspirin and Brilinta. She reports taking her medications regularly. She does feel that she gets a little bit dry mouth and may be a little dehydrated. She is on Lasix 20 mg daily but was found to have a mildly reduced EF about 45%.  04/12/2016  Sara Fox returns today for follow-up. Overall she is feeling very well. She denies any chest pain or  worsening shortness of breath. She's had about 11 pound weight loss and has been working with physical medicine and rehabilitation for ongoing treatment of chronic pain. She denies any worsening chest pain or shortness of breath. Her stent was placed last more than one year ago. She has been on aspirin and Brilinta. I decreased her Lasix at her last office visit and increased her lisinopril. Blood pressure is fairly well-controlled.  PMHx:  Past Medical History:  Diagnosis Date  . Back pain   . Cervical cancer ([place])   . Hyperlipidemia   . Hypertension   . Neck pain   . Stomach problems     Past Surgical History:  Procedure Laterality Date  . ABDOMINAL HYSTERECTOMY    . BACK SURGERY  2012   lower back  . CARDIAC CATHETERIZATION  06/1999   noncritical disease invovling PDA  . CARDIAC CATHETERIZATION N/A 09/14/2014   Procedure: Left Heart Cath and Coronary Angiography;  Surgeon: Leonie Man, MD;  Location: [facility] INVASIVE CV LAB CUPID;  Service: Cardiovascular;  Laterality: N/A;  . CHOLECYSTECTOMY    . KNEE SURGERY Bilateral 2001 & 2007  . NECK SURGERY  2012   2012  . PARTIAL GASTRECTOMY  2005   subtotal  . PERCUTANEOUS CORONARY STENT INTERVENTION (PCI-S)  09/14/2014   Procedure: Percutaneous Coronary Stent Intervention (Pci-S);  Surgeon: Leonie Man, MD;  Location: [facility] INVASIVE CV LAB CUPID;  Service: Cardiovascular;;  . SHOULDER SURGERY Right   . TRANSTHORACIC ECHOCARDIOGRAM  06/02/2010   EF=>55%, normal LV systolic function; normal RV systolic function;  mild mitral annular calcif; trace TR; AV mildly sclerotic    FAMHx:  Family History  Problem Relation Age of Onset  . Heart disease Mother     also HTN  . Diabetes Mother   . Colon cancer Mother   . Heart disease Brother     deceased at 76  . Hypertension Brother   . Heart attack Brother   . Heart disease Brother   . Hypertension Brother     SOCHx:   reports that she has been smoking.  She has a 60.00 pack-year smoking  history. She has never used smokeless tobacco. She reports that she does not drink alcohol or use drugs.  ALLERGIES:  Allergies  Allergen Reactions  . Buprenorphine Hcl Shortness Of Breath    Throat swelling/trouble breathing and lethargic  . Morphine And Related Shortness Of Breath    Throat swelling/trouble breathing and lethargic  . Celebrex [Celecoxib]   . Codeine   . Vioxx [Rofecoxib]     ROS: Pertinent items noted in HPI and remainder of comprehensive ROS otherwise negative.  HOME MEDS: Current Outpatient Prescriptions  Medication Sig Dispense Refill  . aspirin 81 MG tablet Take 1 tablet (81 mg total) by mouth daily. 30 tablet   . budesonide-formoterol (SYMBICORT) 160-4.5 MCG/ACT inhaler Inhale 2 puffs into the lungs 2 (two) times daily.    Marland Kitchen CALCIUM CITRATE PO Take 1 tablet by mouth daily.     . Cholecalciferol (VITAMIN D3) 2000 UNITS capsule Take 2,000 Units by mouth daily.    . furosemide (LASIX) 20 MG tablet Take 10 mg by mouth daily.     Marland Kitchen gabapentin (NEURONTIN) 100 MG capsule TAKE ONE CAPSULE BY MOUTH THREE TIMES DAILY 270 capsule 0  . lisinopril (PRINIVIL,ZESTRIL) 10 MG tablet Take 1 tablet (10 mg total) by mouth at bedtime. 90 tablet 0  . metoprolol tartrate (LOPRESSOR) 50 MG tablet Take 1 tablet (50 mg total) by mouth 2 (two) times daily with a meal. 60 tablet 0  . Multiple Vitamin (MULTIVITAMIN) capsule Take 1 capsule by mouth daily. Contains iron    . rosuvastatin (CRESTOR) 40 MG tablet Take 1 tablet (40 mg total) by mouth daily at 6 PM. 30 tablet 0  . traMADol (ULTRAM) 50 MG tablet Take 1-2 tablets (50-100 mg total) by mouth every 6 (six) hours as needed. 180 tablet 5  . vitamin C (ASCORBIC ACID) 500 MG tablet Take 500 mg by mouth daily.     No current facility-administered medications for this visit.     LABS/IMAGING: No results found for this or any previous visit (from the past 48 hour(s)). No results found.  VITALS: BP (!) 144/78 (BP Location: Left Arm,  Patient Position: Sitting, Cuff Size: Normal)   Pulse 76   Ht '5\' 5"'$  (1.651 m)   Wt 145 lb (65.8 kg)   BMI 24.13 kg/m   EXAM: General appearance: alert and no distress Neck: no JVD Lungs: clear to auscultation bilaterally Heart: regular rate and rhythm, S1, S2 normal and systolic murmur: early systolic 2/6, crescendo at 2nd right intercostal space Abdomen: soft, non-tender; bowel sounds normal; no masses,  no organomegaly Extremities: edema trace bilateral Pulses: 2+ and symmetric Skin: Skin color, texture, turgor normal. No rashes or lesions Neurologic: Grossly normal Psych: Seems somewhat pre-occupied  EKG: Normal sinus rhythm at 76  ASSESSMENT: 1. Coronary artery disease status post PCI to the RCA with a 3.0 x 33 mm Xience Alpine drug-eluting stent (09/2014) 2. Hypertension-controlled 3. Dyslipidemia -followed by  PCP.  PLAN: 1.   Sara Fox is doing well without any significant swelling or recurrent chest pain or shortness of breath. Blood pressure is well-controlled. She is more than a year out since her drug-eluting stent to the right coronary artery. She did have some moderate LAD disease which will follow clinically. At this point I think we can discontinue her Brillinta, but she should continue aspirin. She occasionally gets some positional dizziness when standing up quickly. This may be remedied by moving her lisinopril dose to the evening. She should remain on her current dose medications. Follow-up with me annually or sooner as necessary.  Pixie Casino, MD, [facility] Attending Cardiologist [facility]  Nadean Corwin Hilty 04/12/2016, 1:16 PM

## 2016-04-12 NOTE — Patient Instructions (Addendum)
Medication Instructions:  STOP Brilinta START TAKING your Lisinopril daily at bedtime  Labwork: None   Testing/Procedures: None   Follow-Up: Your physician wants you to follow-up in: 12 months with Dr Debara Pickett. You will receive a reminder letter in the mail two months in advance. If you don't receive a letter, please call our office to schedule the follow-up appointment.  Any Other Special Instructions Will Be Listed Below (If Applicable).     If you need a refill on your cardiac medications before your next appointment, please call your pharmacy.

## 2016-04-13 ENCOUNTER — Encounter: Payer: Medicare Other | Attending: Physical Medicine & Rehabilitation

## 2016-04-13 ENCOUNTER — Ambulatory Visit (HOSPITAL_BASED_OUTPATIENT_CLINIC_OR_DEPARTMENT_OTHER): Payer: Medicare Other | Admitting: Physical Medicine & Rehabilitation

## 2016-04-13 ENCOUNTER — Encounter: Payer: Self-pay | Admitting: Physical Medicine & Rehabilitation

## 2016-04-13 VITALS — BP 171/91 | HR 79

## 2016-04-13 DIAGNOSIS — M545 Low back pain: Secondary | ICD-10-CM | POA: Insufficient documentation

## 2016-04-13 DIAGNOSIS — Z981 Arthrodesis status: Secondary | ICD-10-CM | POA: Diagnosis not present

## 2016-04-13 DIAGNOSIS — M533 Sacrococcygeal disorders, not elsewhere classified: Secondary | ICD-10-CM

## 2016-04-13 NOTE — Patient Instructions (Signed)
Sacroiliac injection was performed today. A combination of a naming medicine plus a cortisone medicine was injected. The injection was done under x-ray guidance. This procedure has been performed to help reduce low back and buttocks pain as well as potentially hip pain. The duration of this injection is variable lasting from hours to  Months. It may repeated if needed. 

## 2016-04-13 NOTE — Progress Notes (Signed)
PROCEDURE RECORD [facility]   Name: Sara Fox DOB:Jan 27, 1941 MRN: [id number]  Date:04/13/2016  Physician: Claudette Laws, MD    Nurse/CMA: Kelli Churn Rn/ Norrine Ballester CMA  Allergies:  Allergies  Allergen Reactions  . Buprenorphine Hcl Shortness Of Breath    Throat swelling/trouble breathing and lethargic  . Morphine And Related Shortness Of Breath    Throat swelling/trouble breathing and lethargic  . Celebrex [Celecoxib]   . Codeine   . Vioxx [Rofecoxib]     Consent Signed: Yes.    Is patient diabetic? No.  CBG today?  Pregnant: No. LMP: No LMP recorded. Patient has had a hysterectomy. (age 51-55)  Anticoagulants: yes (brilinta--stopped three days ago) Anti-inflammatory: no Antibiotics: no  Procedure: bilateral sacroiliac steroid injection Position: Prone Start Time: 1115 End Time:112   Fluoro Time: 11.8sec  RN/CMA Shumaker RN  Alanis Clift CMA    Time 1115 1121    BP 154/81 171/91    Pulse 78 79    Respirations 16 16    O2 Sat 98 99    S/S 6 6    Pain Level 6 3     D/C home with sister, [name] & O X 3, D/C instructions reviewed, and sits independently.

## 2016-04-13 NOTE — Progress Notes (Signed)

## 2016-04-14 ENCOUNTER — Ambulatory Visit: Payer: Medicare Other | Admitting: Physical Medicine & Rehabilitation

## 2016-04-27 ENCOUNTER — Other Ambulatory Visit: Payer: Self-pay | Admitting: Internal Medicine

## 2016-04-27 ENCOUNTER — Other Ambulatory Visit: Payer: Self-pay | Admitting: Physical Medicine & Rehabilitation

## 2016-04-27 NOTE — Telephone Encounter (Signed)
Rx(s) sent to pharmacy electronically.  

## 2016-04-28 ENCOUNTER — Ambulatory Visit: Payer: Medicare Other | Admitting: Podiatry

## 2016-05-09 ENCOUNTER — Other Ambulatory Visit: Payer: Self-pay | Admitting: Physical Medicine & Rehabilitation

## 2016-05-10 ENCOUNTER — Telehealth: Payer: Self-pay

## 2016-05-10 ENCOUNTER — Other Ambulatory Visit: Payer: Self-pay

## 2016-05-11 NOTE — Telephone Encounter (Signed)
error 

## 2016-05-30 ENCOUNTER — Telehealth: Payer: Self-pay | Admitting: *Deleted

## 2016-05-30 NOTE — Telephone Encounter (Signed)
Sara Fox called and said her pharmacy has been sending requests for refill gabapentin.  I looked and she was given refill 05/10/16 3 month supply so it is not time to refill. E-Prescribing Status: Receipt confirmed by pharmacy (05/10/2016 12:44 PM EST) I tried to call and leave message but voicemail is full.

## 2016-06-21 DIAGNOSIS — R42 Dizziness and giddiness: Secondary | ICD-10-CM | POA: Diagnosis not present

## 2016-06-21 DIAGNOSIS — Z720 Tobacco use: Secondary | ICD-10-CM | POA: Diagnosis not present

## 2016-06-21 DIAGNOSIS — W07XXXA Fall from chair, initial encounter: Secondary | ICD-10-CM | POA: Diagnosis not present

## 2016-06-21 DIAGNOSIS — I10 Essential (primary) hypertension: Secondary | ICD-10-CM | POA: Diagnosis not present

## 2016-06-23 ENCOUNTER — Other Ambulatory Visit: Payer: Self-pay | Admitting: Nurse Practitioner

## 2016-06-23 DIAGNOSIS — R42 Dizziness and giddiness: Secondary | ICD-10-CM

## 2016-07-13 ENCOUNTER — Ambulatory Visit: Payer: Medicare Other | Admitting: Physical Medicine & Rehabilitation

## 2016-07-14 ENCOUNTER — Ambulatory Visit (HOSPITAL_BASED_OUTPATIENT_CLINIC_OR_DEPARTMENT_OTHER): Payer: Medicare Other | Admitting: Physical Medicine & Rehabilitation

## 2016-07-14 ENCOUNTER — Encounter: Payer: Medicare Other | Attending: Physical Medicine & Rehabilitation

## 2016-07-14 ENCOUNTER — Encounter: Payer: Self-pay | Admitting: Physical Medicine & Rehabilitation

## 2016-07-14 VITALS — BP 174/86 | HR 85

## 2016-07-14 DIAGNOSIS — M533 Sacrococcygeal disorders, not elsewhere classified: Secondary | ICD-10-CM

## 2016-07-14 DIAGNOSIS — R2689 Other abnormalities of gait and mobility: Secondary | ICD-10-CM | POA: Diagnosis not present

## 2016-07-14 MED ORDER — TRAMADOL HCL 50 MG PO TABS: 50.0000 mg | ORAL_TABLET | Freq: Four times a day (QID) | ORAL | 5 refills | Status: DC | PRN

## 2016-07-14 NOTE — Patient Instructions (Signed)
Sacroiliac injection was performed today. A combination of a naming medicine plus a cortisone medicine was injected. The injection was done under x-ray guidance. This procedure has been performed to help reduce low back and buttocks pain as well as potentially hip pain. The duration of this injection is variable lasting from hours to  Months. It may repeated if needed. 

## 2016-07-14 NOTE — Progress Notes (Signed)

## 2016-07-14 NOTE — Progress Notes (Signed)
  PROCEDURE RECORD [facility]   Name: Sara Fox DOB:1941-03-16 MRN: [id number]  Date:07/14/2016  Physician: Alysia Penna, MD    Nurse/CMA: Bright CMA  Allergies:  Allergies  Allergen Reactions  . Buprenorphine Hcl Shortness Of Breath    Throat swelling/trouble breathing and lethargic  . Morphine And Related Shortness Of Breath    Throat swelling/trouble breathing and lethargic  . Celebrex [Celecoxib]   . Codeine   . Vioxx [Rofecoxib]     Consent Signed: Yes.    Is patient diabetic? No.  CBG today?  Pregnant: No. LMP: No LMP recorded. Patient has had a hysterectomy. (age 78-55)  Anticoagulants: no Anti-inflammatory: no Antibiotics: no  Procedure:Bilateral Sacroiliac steriod injection   Position: Prone Start Time:11:42am  End Time:11:50pm  Fluoro Time:16s RN/CMA Bright CMA Bright CMA    Time 1120am 1156pm    BP 174/86 198/89    Pulse 84 84    Respirations 16 16    O2 Sat 98 98    S/S 6 6    Pain Level 8 3     D/C home with Daughter Macon Large, [name] & O X 3, D/C instructions reviewed, and sits independently.

## 2016-07-20 DIAGNOSIS — I10 Essential (primary) hypertension: Secondary | ICD-10-CM | POA: Diagnosis not present

## 2016-07-20 DIAGNOSIS — R413 Other amnesia: Secondary | ICD-10-CM | POA: Diagnosis not present

## 2016-07-28 ENCOUNTER — Ambulatory Visit
Admission: RE | Admit: 2016-07-28 | Discharge: 2016-07-28 | Disposition: A | Discharge status: Home / Self Care | Payer: Medicare Other | Source: Ambulatory Visit | Attending: Nurse Practitioner | Admitting: Nurse Practitioner

## 2016-07-28 DIAGNOSIS — R42 Dizziness and giddiness: Secondary | ICD-10-CM | POA: Diagnosis not present

## 2016-07-28 MED ORDER — IOPAMIDOL (ISOVUE-300) INJECTION 61%
75.0000 mL | Freq: Once | INTRAVENOUS | Status: AC | PRN
  Administered 2016-07-28: 75 mL via INTRAVENOUS

## 2016-08-18 ENCOUNTER — Other Ambulatory Visit: Payer: Self-pay | Admitting: Physical Medicine & Rehabilitation

## 2016-08-30 DIAGNOSIS — M7751 Other enthesopathy of right foot: Secondary | ICD-10-CM | POA: Diagnosis not present

## 2016-08-30 DIAGNOSIS — M2041 Other hammer toe(s) (acquired), right foot: Secondary | ICD-10-CM | POA: Diagnosis not present

## 2016-08-30 DIAGNOSIS — G5761 Lesion of plantar nerve, right lower limb: Secondary | ICD-10-CM | POA: Diagnosis not present

## 2016-08-30 DIAGNOSIS — L600 Ingrowing nail: Secondary | ICD-10-CM | POA: Diagnosis not present

## 2016-08-30 DIAGNOSIS — M722 Plantar fascial fibromatosis: Secondary | ICD-10-CM | POA: Diagnosis not present

## 2016-09-20 DIAGNOSIS — M7751 Other enthesopathy of right foot: Secondary | ICD-10-CM | POA: Diagnosis not present

## 2016-09-20 DIAGNOSIS — G5761 Lesion of plantar nerve, right lower limb: Secondary | ICD-10-CM | POA: Diagnosis not present

## 2016-10-12 DIAGNOSIS — L600 Ingrowing nail: Secondary | ICD-10-CM | POA: Diagnosis not present

## 2016-10-12 DIAGNOSIS — B079 Viral wart, unspecified: Secondary | ICD-10-CM | POA: Diagnosis not present

## 2016-10-12 DIAGNOSIS — M79671 Pain in right foot: Secondary | ICD-10-CM | POA: Diagnosis not present

## 2016-10-13 ENCOUNTER — Ambulatory Visit (HOSPITAL_BASED_OUTPATIENT_CLINIC_OR_DEPARTMENT_OTHER): Payer: Medicare Other | Admitting: Physical Medicine & Rehabilitation

## 2016-10-13 ENCOUNTER — Encounter: Payer: Medicare Other | Attending: Physical Medicine & Rehabilitation

## 2016-10-13 ENCOUNTER — Encounter: Payer: Self-pay | Admitting: Physical Medicine & Rehabilitation

## 2016-10-13 VITALS — BP 157/71 | HR 74 | Resp 14

## 2016-10-13 DIAGNOSIS — M533 Sacrococcygeal disorders, not elsewhere classified: Secondary | ICD-10-CM

## 2016-10-13 DIAGNOSIS — R2689 Other abnormalities of gait and mobility: Secondary | ICD-10-CM | POA: Insufficient documentation

## 2016-10-13 NOTE — Progress Notes (Signed)

## 2016-10-13 NOTE — Progress Notes (Signed)
PROCEDURE RECORD [facility]   Name: ROSSANA BARTKOWIAK DOB:May 12, 1941 MRN: [id number]  Date:10/13/2016  Physician: Claudette Laws, MD    Nurse/CMA: Randol Zumstein, CMA  Allergies:  Allergies  Allergen Reactions  . Buprenorphine Hcl Shortness Of Breath    Throat swelling/trouble breathing and lethargic  . Morphine And Related Shortness Of Breath    Throat swelling/trouble breathing and lethargic  . Celebrex [Celecoxib]   . Codeine   . Vioxx [Rofecoxib]     Consent Signed: Yes.    Is patient diabetic? No.  CBG today? n/a  Pregnant: No. LMP: No LMP recorded. Patient has had a hysterectomy. (age 55-55)  Anticoagulants: yes (brilinta, Dr. Wynn Banker authorized injection) Anti-inflammatory: no Antibiotics: no  Procedure: bilateral sacroiliac steroid injection  Position: Prone Start Time: 11:42am  End Time: 11:49am  Fluoro Time: 29  RN/CMA Jacqueline Delapena, CMA Britt Theard, CMA    Time 11:20 am 11:51am    BP 157/71 16/99    Pulse 74 72    Respirations 14 14    O2 Sat 97 98    S/S 6 6    Pain Level 7/10 8/10     D/C home with Son, [name] & O X 3, D/C instructions reviewed, and sits independently.

## 2016-10-13 NOTE — Patient Instructions (Addendum)
MAY RESUME BRILLINTA TODAY  Sacroiliac injection was performed today. A combination of a naming medicine plus a cortisone medicine was injected. The injection was done under x-ray guidance. This procedure has been performed to help reduce low back and buttocks pain as well as potentially hip pain. The duration of this injection is variable lasting from hours to  Months. It may repeated if needed.

## 2016-10-31 ENCOUNTER — Other Ambulatory Visit: Payer: Self-pay | Admitting: Physical Medicine & Rehabilitation

## 2016-11-08 DIAGNOSIS — R413 Other amnesia: Secondary | ICD-10-CM | POA: Diagnosis not present

## 2016-11-08 DIAGNOSIS — Z720 Tobacco use: Secondary | ICD-10-CM | POA: Diagnosis not present

## 2016-11-08 DIAGNOSIS — R5383 Other fatigue: Secondary | ICD-10-CM | POA: Diagnosis not present

## 2016-12-20 ENCOUNTER — Encounter: Payer: Self-pay | Admitting: Neurology

## 2016-12-21 ENCOUNTER — Ambulatory Visit (INDEPENDENT_AMBULATORY_CARE_PROVIDER_SITE_OTHER): Payer: Medicare Other | Admitting: Neurology

## 2016-12-21 ENCOUNTER — Encounter: Payer: Self-pay | Admitting: Neurology

## 2016-12-21 VITALS — BP 101/55 | HR 68 | Ht 64.0 in | Wt 147.0 lb

## 2016-12-21 DIAGNOSIS — R0683 Snoring: Secondary | ICD-10-CM | POA: Insufficient documentation

## 2016-12-21 DIAGNOSIS — R0602 Shortness of breath: Secondary | ICD-10-CM

## 2016-12-21 DIAGNOSIS — J449 Chronic obstructive pulmonary disease, unspecified: Secondary | ICD-10-CM

## 2016-12-21 DIAGNOSIS — G4733 Obstructive sleep apnea (adult) (pediatric): Secondary | ICD-10-CM | POA: Diagnosis not present

## 2016-12-21 DIAGNOSIS — F1190 Opioid use, unspecified, uncomplicated: Secondary | ICD-10-CM

## 2016-12-21 DIAGNOSIS — R600 Localized edema: Secondary | ICD-10-CM | POA: Insufficient documentation

## 2016-12-21 DIAGNOSIS — M25471 Effusion, right ankle: Secondary | ICD-10-CM

## 2016-12-21 DIAGNOSIS — M25472 Effusion, left ankle: Secondary | ICD-10-CM | POA: Diagnosis not present

## 2016-12-21 HISTORY — DX: Localized edema: R60.0

## 2016-12-21 NOTE — Progress Notes (Signed)
SLEEP MEDICINE CLINIC   Provider:  Larey Seat, M D  Primary Care Physician:  [name] Chard, MD   Referring Provider: [name] Chard, MD   Chief Complaint  Patient presents with  . New Patient (Initial Visit)    pt with daughter, referred for sleep, pt has difficulty with falling asleep and staying asleep. husband has stated witnessed snoring. pt states she has woke up feeling like choking a couple times that she remembers.    HPI:  Sara Fox is a 76 y.o. female , seen here as in a referral  from Dr. Baird Cancer and [name], NP.   Chief complaint according to patient : " I snore"   I had the pleasure of meeting Sara Fox today on 12/21/2016 as a new patient to my practice. She is a 76 year old right-handed 35 female ( black foot indian ) with a history of tobacco dependence, dyspnea, beginning COPD, osteopenia, hypercholesterolemia, and hypertension. She has endorsed a very have heavy use of tobacco for the last 55 years, Dr. Baird Cancer also was concerned about her declining memory, and her complaint of chronically being fatigued.   I also reviewed the patient's medication she sees Dr. Read Drivers for pain management. 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 Is the home is quiet she can fall asleep easily and she will stay asleep for about 2 hours, but she wakes up spontaneously. Her husband fixes his own breakfast and lunch in the morning but she does hear the sounds and wakes up. The couple lives with several dogs, she feels that most of her arousals from sleep and spontaneous.  She will rises finally at about 10:30 AM. Off the 6 hours and bed she may get sleep for 4 hours or less. She would be chronically sleep deprived. She is not as physically active in the afternoons, she watches TV and she often falls asleep in front. She will sit at a desk and falls asleep, she usually buys lunch and dinner and takes this home.  Sleep medical history and family sleep history:  " I never had great sleep " -  I slept last well 40 years ago.  Family history of  "poor circulation and heart disease' not diabetes.   Social history: married, 2 biological, 12 grandchildren( merged family) , heavy tobacco user. Alcohol- none ( used to 45 years ago) , caffeine: "I drink coffee 24/7! "     Review of Systems: Out of a complete 14 system review, the patient complains of only the following symptoms, and all other reviewed systems are negative. Mrs. Bruington endorsed weight loss, swelling in her legs, development of molds, coughing, wheezing, shortness of breath, headaches, memory loss change in appetite, runny nose and overall not getting enough sleep.  How likely are you to doze in the following situations: 0 = not likely, 1 = slight chance, 2 = moderate chance, 3 = high chance  Sitting and Reading? 3 Watching Television?2 Sitting inactive in a public place (theater or meeting)?2 As a passenger in a car for an hour without a break?3  Lying down in the afternoon when circumstances  permit?3 Sitting and talking to someone?1 Sitting quietly after lunch without alcohol?3 In a car, while stopped for a few minutes in traffic?3  Total =Epworth score 19 , Fatigue severity score 56  , depression score 10/ 15    Social History   Social History  . Marital status: Married    Spouse name: N/A  . Number of children: 2  . Years of education: GED   Occupational History  .  Retired   Social History Main Topics  . Smoking status: Current Every Day Smoker    Packs/day: 1.00    Years: 60.00  .  Smokeless tobacco: Never Used     Comment: currently smokes 1/2ppd or less  . Alcohol use No  . Drug use: No  . Sexual activity: Not on file   Other Topics Concern  . Not on file   Social History Narrative  . No narrative on file    Family History  Problem Relation Age of Onset  . Heart disease Mother        also HTN  . Diabetes Mother   . Colon cancer Mother   . Heart disease Brother        deceased at 98  . Hypertension Brother   . Heart attack Brother   . Heart disease Brother   . Hypertension Brother     Past Medical History:  Diagnosis Date  . Back pain   . Cervical cancer ([place])   . Hyperlipidemia   . Hypertension   . Neck pain   . Stomach problems     Past Surgical History:  Procedure Laterality Date  . ABDOMINAL HYSTERECTOMY    . BACK SURGERY  2012   lower back  . CARDIAC CATHETERIZATION  06/1999   noncritical disease invovling PDA  . CARDIAC CATHETERIZATION N/A 09/14/2014   Procedure: Left Heart Cath and Coronary Angiography;  Surgeon: Leonie Man, MD;  [facility] INVASIVE CV LAB CUPID;  Service: Cardiovascular;  Laterality: N/A;  . CHOLECYSTECTOMY    . KNEE SURGERY Bilateral 2001 & 2007  . NECK SURGERY  2012   2012  . PARTIAL GASTRECTOMY  2005   subtotal  . PERCUTANEOUS CORONARY STENT INTERVENTION (PCI-S)  09/14/2014   Procedure: Percutaneous Coronary Stent Intervention (Pci-S);  Surgeon: Leonie Man, MD;  Location: [facility] INVASIVE CV LAB CUPID;  Service: Cardiovascular;;  . SHOULDER SURGERY Right   . TRANSTHORACIC ECHOCARDIOGRAM  06/02/2010   EF=>55%, normal LV systolic function; normal RV systolic function; mild mitral annular calcif; trace TR; AV mildly sclerotic    Current Outpatient Prescriptions  Medication Sig Dispense Refill  . aspirin 81 MG tablet Take 1 tablet (81 mg total) by mouth daily. 30 tablet   . budesonide-formoterol (SYMBICORT) 160-4.5 MCG/ACT inhaler Inhale 2 puffs into the lungs 2 (two) times daily.    Marland Kitchen CALCIUM CITRATE PO  Take 1 tablet by mouth daily.     . Cholecalciferol (VITAMIN D3) 2000 UNITS capsule Take 2,000 Units by mouth daily.    . furosemide (LASIX) 20 MG tablet Take 20 mg by mouth 2 (two) times daily.     Marland Kitchen gabapentin (NEURONTIN) 100 MG capsule TAKE 1 CAPSULE BY MOUTH THREE TIMES DAILY 270 capsule 0  . lisinopril (PRINIVIL,ZESTRIL) 10 MG tablet TAKE ONE TABLET BY MOUTH ONCE DAILY 90 tablet 3  . metoprolol tartrate (LOPRESSOR) 50 MG tablet Take 1 tablet (50 mg total) by mouth 2 (two) times daily with a meal. 60 tablet 0  . Multiple Vitamin (  MULTIVITAMIN) capsule Take 1 capsule by mouth daily. Contains iron    . oxybutynin (DITROPAN) 5 MG tablet Take 2.5 mg by mouth 2 (two) times daily.    . rosuvastatin (CRESTOR) 40 MG tablet Take 1 tablet (40 mg total) by mouth daily at 6 PM. 30 tablet 0  . ticagrelor (BRILINTA) 90 MG TABS tablet Take by mouth 2 (two) times daily.    . traMADol (ULTRAM) 50 MG tablet Take 1-2 tablets (50-100 mg total) by mouth every 6 (six) hours as needed. 180 tablet 5  . vitamin C (ASCORBIC ACID) 500 MG tablet Take 500 mg by mouth daily.     No current facility-administered medications for this visit.     Allergies as of 12/21/2016 - Review Complete 12/21/2016  Allergen Reaction Noted  . Buprenorphine hcl Shortness Of Breath 12/02/2015  . Morphine and related Shortness Of Breath 10/17/2010  . Celebrex [celecoxib]  10/17/2010  . Codeine  02/19/2013  . Vioxx [rofecoxib]  10/17/2010    Vitals: BP (!) 101/55   Pulse 68   Ht 5\' 4"  (1.626 m)   Wt 147 lb (66.7 kg)   BMI 25.23 kg/m  Last Weight:  Wt Readings from Last 1 Encounters:  12/21/16 147 lb (66.7 kg)   OAC:ZYSA mass index is 25.23 kg/m.     Last Height:   Ht Readings from Last 1 Encounters:  12/21/16 5\' 4"  (1.626 m)    Physical exam:  General: The patient is awake, alert and appears not in acute distress. The patient is poorly groomed. Poor dentition,  Head: Normocephalic, atraumatic. Neck is supple. Mallampati  4,  neck circumference:14 . Nasal airflow congested,  Retrognathia is seen.  Cardiovascular:  Regular rate and rhythm , without  murmurs or carotid bruit, and without distended neck veins. Respiratory: Lungs are clear to auscultation. Skin:  With ankle edema, or rash- Tongue is black spotted.  Trunk: BMI is 25. The patient's posture is stooped    Neurologic exam : The patient is awake and alert, oriented to place and time.   Memory subjective described as intact.  Attention span & concentration ability appears normal.  Speech is fluent,  without dysarthria, dysphonia or aphasia.  Mood and affect are appropriate.  Cranial nerves: Pupils are equal and briskly reactive to light. Funduscopic exam deferred . Extraocular movements  in vertical and horizontal planes intact and without nystagmus. Visual fields by finger perimetry are intact. Hearing to finger rub intact. Facial sensation intact to fine touch. Facial motor strength is symmetric and tongue and uvula move midline. Shoulder shrug was symmetrical.  Motor exam:   Normal tone, muscle bulk and symmetric strength in all extremities.Sensory:  Fine touch, pinprick and vibration were normal. Coordination: Rapid alternating movements in the fingers/hands was normal. Finger-to-nose maneuver  normal without evidence of ataxia, dysmetria or tremor.Gait and station: Patient walks without assistive device. Turns with 4 Steps. Romberg testing is negative. Deep tendon reflexes: in the  upper and lower extremities are symmetric and intact. Babinski maneuver response is downgoing.   Assessment:  After physical and neurologic examination, review of laboratory studies,  Personal review of imaging studies, reports of other /same  Imaging studies, results of polysomnography and / or neurophysiology testing and pre-existing records as far as provided in visit., my assessment is   1)  Excessive daytime sleepiness. Attributed to poor sleep hygiene, no routines,  rules or boundaries. We discussed tobacco and caffeine use, but the patient is not willing. She is still working  a paper route.   2)  COPD, wheezing- sleep choking and coughing.   3)  snoring- possible apnea, but very likely hypoxemia.  The patient was advised of the nature of the diagnosed disorder , the treatment options and the  risks for general health and wellness arising from not treating the condition.  I spent more than 35 minutes of face to face time with the patient.Greater than 50% of time was spent in counseling and coordination of care. We have discussed the diagnosis and differential and I answered the patient's questions.    Plan:  Treatment plan and additional workup : I will invite Mrs. Ransdell for a daytime sleep study, looking at her sleep habits there is no chance that she will sleep at night between 10 and 5 AM. 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 The patient was advised that she cannot smoke on the premises, and that she should refrain from caffeine prior to the sleep study. We do not provide TVs in the bedroom. My diagnosis is sleep deprivation, OSA with COPD, SPLIT night daytime study ordered. Insomnia booklet given.   Larey Seat, MD 05/20/1094, 0:45 AM  Certified in Neurology by ABPN Certified in [place] by [facility] [address], [place], [place] [address]

## 2017-01-11 ENCOUNTER — Ambulatory Visit (INDEPENDENT_AMBULATORY_CARE_PROVIDER_SITE_OTHER): Payer: Medicare Other | Admitting: Neurology

## 2017-01-11 DIAGNOSIS — F1190 Opioid use, unspecified, uncomplicated: Secondary | ICD-10-CM

## 2017-01-11 DIAGNOSIS — M25472 Effusion, left ankle: Secondary | ICD-10-CM

## 2017-01-11 DIAGNOSIS — M25471 Effusion, right ankle: Secondary | ICD-10-CM

## 2017-01-11 DIAGNOSIS — G4733 Obstructive sleep apnea (adult) (pediatric): Secondary | ICD-10-CM

## 2017-01-11 DIAGNOSIS — J449 Chronic obstructive pulmonary disease, unspecified: Secondary | ICD-10-CM

## 2017-01-11 DIAGNOSIS — R0683 Snoring: Secondary | ICD-10-CM

## 2017-01-11 DIAGNOSIS — R0602 Shortness of breath: Secondary | ICD-10-CM

## 2017-01-12 NOTE — Procedures (Signed)
PATIENT'S NAME:  Sara Fox DOB:      [birth year]      MR#:    [id number]     DATE OF RECORDING: 01/11/2017 REFERRING M.D.:  [name] Chard, MD Study Performed:   Baseline Polysomnogram HISTORY:  Sara Fox is a 76 year old female patient of Dr. Baird Cancer' and [name], NP.  Pt is a right-handed 81 female (black foot [place]) with a history of tobacco dependence, dyspnea, possible COPD, osteopenia, hypercholesterolemia, and hypertension. She has endorsed a very have heavy use of tobacco for the last 55 years. Dr. Baird Cancer was concerned about her complaint of chronically being fatigued, of Excessive daytime sleepiness, attributed to poor sleep hygiene, no routines, rules or boundaries. Patient snores.  The patient endorsed the Epworth Sleepiness Scale at 19/24 points.   The patient's weight 148 pounds with a height of 64 (inches), resulting in a BMI of 25.2 kg/m2. The patient's neck circumference measured 14 inches.  CURRENT MEDICATIONS: Aspirin, Symbicort, Calcium, Vitamin D3, Lasix, Neurontin, Prinivil, Lopressor, Multivitamin, Ditropan, Crestor, Brilinta, Ultram, Vitamin C   PROCEDURE:  This is a multichannel digital polysomnogram utilizing the SomnoStar 11.2 system.  Electrodes and sensors were applied and monitored per AASM Specifications.   EEG, EOG, Chin and Limb EMG, were sampled at 200 Hz.  ECG, Snore and Nasal Pressure, Thermal Airflow, Respiratory Effort, CPAP Flow and Pressure, Oximetry was sampled at 50 Hz. Digital video and audio were recorded.      BASELINE STUDY:  Lights Out was at 07:55 and Lights On at 15:00.  Total recording time (TRT) was 425 minutes, with a total sleep time (TST) of 414.5 minutes.   The patient's sleep latency was 1 minutes.  REM latency was 370 minutes.  The sleep efficiency was 97.5 %.     SLEEP ARCHITECTURE: WASO (Wake after sleep onset) was 9.5 minutes.  There were 4.5 minutes in Stage N1, 239 minutes Stage N2, 145.5 minutes Stage N3 and 25.5  minutes in Stage REM.  The percentage of Stage N1 was 1.1%, Stage N2 was 57.7%, Stage N3 was 35.1% and Stage R (REM sleep) was 6.2%.   RESPIRATORY ANALYSIS:  There were a total of 0 respiratory events:  0 obstructive apneas, 0 central apneas and 0 mixed apneas with a total of 0 apneas and an apnea index (AI) of 0 /hour. There were 0 hypopneas with a hypopnea index of 0 /hour. The patient also had 0 respiratory event related arousals (RERAs).  The patient spent 0 minutes of total sleep time in the supine position and 415 minutes in non-supine. The supine AHI was 0.0 versus a non-supine AHI of 0.0.  OXYGEN SATURATION & C02:  The Wake baseline 02 saturation was 98%, with the lowest being 93%. Time spent below 89% saturation equaled 0 minutes.   PERIODIC LIMB MOVEMENTS:  The patient had a total of 0 Periodic Limb Movements. The arousals were noted as: 48 were spontaneous, 0 were associated with PLMs, and 0 were associated with respiratory events.  Audio and video analysis did not show any abnormal or unusual movements, behaviors, phonations or vocalizations.  No nocturia. Mild snoring.   EKG was in keeping with normal sinus rhythm (NSR). The Periodic Limb Movement (PLM) index was 0 and the PLM Arousal index was 0/hour.  Post-study, the patient indicated that sleep was the same as usual.    IMPRESSION: Surprisingly, there was no physiological sleep disorder identified.  Most sleep interruptions were spontaneous.   RECOMMENDATIONS:  NO  follow up with the sleep clinic is needed.  I certify that I have reviewed the entire raw data recording prior to the issuance of this report in accordance with the Standards of Accreditation of the [place] [employer] (AASM)      Larey Seat, MD    01-12-2017  Diplomat, American Board of Psychiatry and Neurology  Diplomat, American Board of [place] Director, [place] [facility] [employer]

## 2017-01-16 ENCOUNTER — Telehealth: Payer: Self-pay | Admitting: Neurology

## 2017-01-16 NOTE — Telephone Encounter (Signed)
Called and discussed the sleep study results with the patient. I made her aware there was no findings of sleep apnea. Pt had no other questions at this time

## 2017-01-16 NOTE — Telephone Encounter (Signed)
-----   Message from Larey Seat, MD sent at 01/12/2017  1:08 PM EDT ----- Mrs. Buckle slept 414 minutes during this PSG , had no apnea, hypoxia and no PLMs. No intervention or follow up needed. CD

## 2017-01-18 ENCOUNTER — Encounter: Payer: Medicare Other | Attending: Physical Medicine & Rehabilitation

## 2017-01-18 ENCOUNTER — Encounter: Payer: Self-pay | Admitting: Physical Medicine & Rehabilitation

## 2017-01-18 ENCOUNTER — Ambulatory Visit (HOSPITAL_BASED_OUTPATIENT_CLINIC_OR_DEPARTMENT_OTHER): Payer: Medicare Other | Admitting: Physical Medicine & Rehabilitation

## 2017-01-18 VITALS — BP 147/81 | HR 87

## 2017-01-18 DIAGNOSIS — R2689 Other abnormalities of gait and mobility: Secondary | ICD-10-CM | POA: Diagnosis not present

## 2017-01-18 DIAGNOSIS — M533 Sacrococcygeal disorders, not elsewhere classified: Secondary | ICD-10-CM | POA: Diagnosis not present

## 2017-01-18 MED ORDER — TRAMADOL HCL 50 MG PO TABS: 50.0000 mg | ORAL_TABLET | Freq: Four times a day (QID) | ORAL | 5 refills | Status: DC | PRN

## 2017-01-18 NOTE — Progress Notes (Signed)
PROCEDURE RECORD [facility]   Name: Sara Fox DOB:Sep 24, 1940 MRN: [id number]  Date:01/18/2017  Physician: Claudette Laws, MD    Nurse/CMA: Prather Failla CMA  Allergies:  Allergies  Allergen Reactions  . Buprenorphine Hcl Shortness Of Breath    Throat swelling/trouble breathing and lethargic  . Morphine And Related Shortness Of Breath    Throat swelling/trouble breathing and lethargic  . Celebrex [Celecoxib]   . Codeine   . Vioxx [Rofecoxib]     Consent Signed: Yes.    Is patient diabetic? No.  CBG today? N/A  Pregnant: No. LMP: No LMP recorded. Patient has had a hysterectomy. (age 35-55)  Anticoagulants: yes (brilinta stoped 3 days ago) Anti-inflammatory: no Antibiotics: no  Procedure: Bilateral sacroiliac injections Position: Prone   Start Time: 1005am End Time:1015am  Fluoro Time:20s  RN/CMA Rich Paprocki CMA Jahmire Ruffins CMA    Time 931am 1022am    BP 147/81 175/85    Pulse 87 80    Respirations 18 18    O2 Sat 94% 97    S/S 6 6    Pain Level 10/10 8/10     D/C home with Daughter Sara Fox, [name] & O X 3, D/C instructions reviewed, and sits independently.

## 2017-01-18 NOTE — Progress Notes (Signed)
Bilateral sacroiliac injections under fluoroscopic guidance  Indication: Low back and buttocks pain not relieved by medication management and other conservative care.  Informed consent was obtained after describing risks and benefits of the procedure with the patient, this includes bleeding, bruising, infection, paralysis and medication side effects. The patient wishes to proceed and has given written consent. The patient was placed in a prone position. The lumbar and sacral area was marked and prepped with Betadine. A 25-gauge 1-1/2 inch needle was inserted into the skin and subcutaneous tissue and 1 mL of 1% lidocaine was injected into each side. Then a 25-gauge 3 inch spinal needle was inserted under fluoroscopic guidance into the left sacroiliac joint. AP and lateral images were utilized. Isovue200x0.5 mL under live fluoroscopy demonstrated no intravascular uptake. Then a solution containing one ML of 6 mg per mL Celestone in 2 ML of 2% lidocaine MPF was injected x1.5 mL. This same procedure was repeated on the right side using the same needle, injectate, and technique. Patient tolerated the procedure well. Post procedure instructions were given. Please see post procedure form.

## 2017-04-13 ENCOUNTER — Ambulatory Visit: Payer: Medicare Other | Admitting: Internal Medicine

## 2017-04-19 ENCOUNTER — Ambulatory Visit (HOSPITAL_BASED_OUTPATIENT_CLINIC_OR_DEPARTMENT_OTHER): Payer: Medicare Other | Admitting: Physical Medicine & Rehabilitation

## 2017-04-19 ENCOUNTER — Encounter: Payer: Medicare Other | Attending: Physical Medicine & Rehabilitation

## 2017-04-19 ENCOUNTER — Encounter: Payer: Self-pay | Admitting: Physical Medicine & Rehabilitation

## 2017-04-19 VITALS — BP 129/73 | HR 74 | Resp 14

## 2017-04-19 DIAGNOSIS — R2689 Other abnormalities of gait and mobility: Secondary | ICD-10-CM | POA: Diagnosis not present

## 2017-04-19 DIAGNOSIS — M533 Sacrococcygeal disorders, not elsewhere classified: Secondary | ICD-10-CM

## 2017-04-19 MED ORDER — GABAPENTIN 100 MG PO CAPS: 100.0000 mg | ORAL_CAPSULE | Freq: Three times a day (TID) | ORAL | 0 refills | Status: DC

## 2017-04-19 NOTE — Patient Instructions (Signed)
Sacroiliac injection was performed today. A combination of a naming medicine plus a cortisone medicine was injected. The injection was done under x-ray guidance. This procedure has been performed to help reduce low back and buttocks pain as well as potentially hip pain. The duration of this injection is variable lasting from hours to  Months. It may repeated if needed.  Next visit is for exam

## 2017-04-19 NOTE — Progress Notes (Signed)
Bilateral sacroiliac injections under fluoroscopic guidance  Indication: Low back and buttocks pain not relieved by medication management and other conservative care.  Informed consent was obtained after describing risks and benefits of the procedure with the patient, this includes bleeding, bruising, infection, paralysis and medication side effects. The patient wishes to proceed and has given written consent. The patient was placed in a prone position. The lumbar and sacral area was marked and prepped with Betadine. A 25-gauge 1-1/2 inch needle was inserted into the skin and subcutaneous tissue and 1 mL of 1% lidocaine was injected into each side. Then a 25-gauge 3 inch spinal needle was inserted under fluoroscopic guidance into the left sacroiliac joint. AP and lateral images were utilized. Isovue200x0.5 mL under live fluoroscopy demonstrated no intravascular uptake. Then a solution containing one ML of 6 mg per mL Celestone in 2 ML of 2% lidocaine MPF was injected x1.5 mL. This same procedure was repeated on the right side using the same needle, injectate, and technique. Patient tolerated the procedure well. Post procedure instructions were given. Please see post procedure form.

## 2017-04-19 NOTE — Progress Notes (Signed)
PROCEDURE RECORD [facility]   Name: Sara Fox DOB:1940-11-15 MRN: [id number]  Date:04/19/2017  Physician: Claudette Laws, MD    Nurse/CMA: Bright, CMA  Allergies:  Allergies  Allergen Reactions  . Buprenorphine Hcl Shortness Of Breath    Throat swelling/trouble breathing and lethargic  . Morphine And Related Shortness Of Breath    Throat swelling/trouble breathing and lethargic  . Celebrex [Celecoxib]   . Codeine   . Vioxx [Rofecoxib]     Consent Signed: Yes.    Is patient diabetic? No.  CBG today? NA  Pregnant: No. LMP: No LMP recorded. Patient has had a hysterectomy. (age 27-55)  Anticoagulants: no Anti-inflammatory: no Antibiotics: no  Procedure: bilateral sacroiliac steroid injection  Position: Prone   Start Time: 1027am End Time: 1038am Fluoro Time: 32s  RN/CMA Sara Fox, CMA Bright, CMA    Time 9:55 am 1040    BP 129/73 145/80    Pulse 74 74    Respirations 14 14    O2 Sat 99 99    S/S 6 6    Pain Level 7/10 5/10     D/C home with daughter Octavio Graves, [name] & O X 3, D/C instructions reviewed, and sits independently.

## 2017-05-02 ENCOUNTER — Other Ambulatory Visit: Payer: Self-pay | Admitting: Internal Medicine

## 2017-05-15 HISTORY — PX: FRACTURE SURGERY: SHX138

## 2017-05-27 ENCOUNTER — Encounter (HOSPITAL_COMMUNITY): Payer: Self-pay | Admitting: *Deleted

## 2017-05-27 ENCOUNTER — Emergency Department (HOSPITAL_COMMUNITY)
Admission: EM | Admit: 2017-05-27 | Discharge: 2017-05-27 | Disposition: A | Discharge status: Home / Self Care | Payer: Medicare Other | Attending: Emergency Medicine | Admitting: Emergency Medicine

## 2017-05-27 ENCOUNTER — Emergency Department (HOSPITAL_COMMUNITY): Payer: Medicare Other

## 2017-05-27 ENCOUNTER — Other Ambulatory Visit: Payer: Self-pay

## 2017-05-27 DIAGNOSIS — Z7982 Long term (current) use of aspirin: Secondary | ICD-10-CM | POA: Insufficient documentation

## 2017-05-27 DIAGNOSIS — N182 Chronic kidney disease, stage 2 (mild): Secondary | ICD-10-CM | POA: Diagnosis not present

## 2017-05-27 DIAGNOSIS — W0110XA Fall on same level from slipping, tripping and stumbling with subsequent striking against unspecified object, initial encounter: Secondary | ICD-10-CM | POA: Insufficient documentation

## 2017-05-27 DIAGNOSIS — I2510 Atherosclerotic heart disease of native coronary artery without angina pectoris: Secondary | ICD-10-CM | POA: Diagnosis not present

## 2017-05-27 DIAGNOSIS — I110 Hypertensive heart disease with heart failure: Secondary | ICD-10-CM | POA: Diagnosis not present

## 2017-05-27 DIAGNOSIS — Y998 Other external cause status: Secondary | ICD-10-CM | POA: Insufficient documentation

## 2017-05-27 DIAGNOSIS — F17200 Nicotine dependence, unspecified, uncomplicated: Secondary | ICD-10-CM | POA: Insufficient documentation

## 2017-05-27 DIAGNOSIS — Y92099 Unspecified place in other non-institutional residence as the place of occurrence of the external cause: Secondary | ICD-10-CM | POA: Insufficient documentation

## 2017-05-27 DIAGNOSIS — I5042 Chronic combined systolic (congestive) and diastolic (congestive) heart failure: Secondary | ICD-10-CM | POA: Diagnosis not present

## 2017-05-27 DIAGNOSIS — Y9389 Activity, other specified: Secondary | ICD-10-CM | POA: Insufficient documentation

## 2017-05-27 DIAGNOSIS — S52502A Unspecified fracture of the lower end of left radius, initial encounter for closed fracture: Secondary | ICD-10-CM | POA: Diagnosis not present

## 2017-05-27 DIAGNOSIS — I252 Old myocardial infarction: Secondary | ICD-10-CM | POA: Insufficient documentation

## 2017-05-27 DIAGNOSIS — S52532A Colles' fracture of left radius, initial encounter for closed fracture: Secondary | ICD-10-CM | POA: Diagnosis not present

## 2017-05-27 DIAGNOSIS — I129 Hypertensive chronic kidney disease with stage 1 through stage 4 chronic kidney disease, or unspecified chronic kidney disease: Secondary | ICD-10-CM | POA: Diagnosis not present

## 2017-05-27 DIAGNOSIS — S6992XA Unspecified injury of left wrist, hand and finger(s), initial encounter: Secondary | ICD-10-CM | POA: Diagnosis present

## 2017-05-27 NOTE — ED Provider Notes (Signed)
[facility] EMERGENCY DEPARTMENT Provider Note   CSN: [id number] Arrival date & time: 05/27/17  0202     History   Chief Complaint Chief Complaint  Patient presents with  . Wrist Pain    HPI RENEA SCHOONMAKER is a 77 y.o. female.  The history is provided by the patient.  Wrist Pain  This is a new problem. Episode onset: Just prior to arrival. The problem occurs constantly. The problem has not changed since onset.Pertinent negatives include no chest pain and no headaches. The symptoms are aggravated by bending. The symptoms are relieved by rest. She has tried rest for the symptoms. The treatment provided mild relief.  Patient reports accidental fall just prior to arrival.  She was trying to get her dog back inside the house, when she slipped and fell landing on her left wrist. She denies head injury, denies loss of consciousness. Denies headache/neck pain/chest pain No other acute complaints  Past Medical History:  Diagnosis Date  . Back pain   . Cervical cancer ([place])   . Hyperlipidemia   . Hypertension   . Neck pain   . Stomach problems     Patient Active Problem List   Diagnosis Date Noted  . Shortness of breath at rest 12/21/2016  . Snoring 12/21/2016  . OSA and COPD overlap syndrome ([place]) 12/21/2016  . Edema of both ankles 12/21/2016  . Gait disorder 02/05/2015  . Peripheral musculoskeletal gait disorder 02/05/2015  . Lumbar post-laminectomy syndrome 02/05/2015  . Coronary artery disease involving native coronary artery of native heart without angina pectoris 10/16/2014  . NSTEMI (non-ST elevated myocardial infarction) ([place]) 09/13/2014  . Cardiomyopathy, ischemic 09/13/2014  . Chronic systolic heart failure ([place])   . Chest pain 09/12/2014  . Tobacco abuse 09/12/2014  . Dyslipidemia 09/12/2014  . CKD (chronic kidney disease), stage II 09/12/2014  . Anemia 09/12/2014  . Chronic diastolic heart failure, NYHA class 1 ([place]) 09/12/2014  . Sacroiliac  dysfunction 10/10/2013  . Postlaminectomy syndrome, cervical region 08/01/2013  . Chronic low back pain 08/01/2013  . Shoulder joint contracture 08/01/2013  . HTN (hypertension) 03/03/2013  . Abnormal genetic test 03/03/2013    Past Surgical History:  Procedure Laterality Date  . ABDOMINAL HYSTERECTOMY    . BACK SURGERY  2012   lower back  . CARDIAC CATHETERIZATION  06/1999   noncritical disease invovling PDA  . CARDIAC CATHETERIZATION N/A 09/14/2014   Procedure: Left Heart Cath and Coronary Angiography;  Surgeon: Leonie Man, MD;  Location: [facility] INVASIVE CV LAB CUPID;  Service: Cardiovascular;  Laterality: N/A;  . CHOLECYSTECTOMY    . KNEE SURGERY Bilateral 2001 & 2007  . NECK SURGERY  2012   2012  . PARTIAL GASTRECTOMY  2005   subtotal  . PERCUTANEOUS CORONARY STENT INTERVENTION (PCI-S)  09/14/2014   Procedure: Percutaneous Coronary Stent Intervention (Pci-S);  Surgeon: Leonie Man, MD;  Location: [facility] INVASIVE CV LAB CUPID;  Service: Cardiovascular;;  . SHOULDER SURGERY Right   . TRANSTHORACIC ECHOCARDIOGRAM  06/02/2010   EF=>55%, normal LV systolic function; normal RV systolic function; mild mitral annular calcif; trace TR; AV mildly sclerotic    OB History    No data available       Home Medications    Prior to Admission medications   Medication Sig Start Date End Date Taking? Authorizing Provider  aspirin 81 MG tablet Take 1 tablet (81 mg total) by mouth daily. 09/15/14   Ghimire, Henreitta Leber, MD  budesonide-formoterol (SYMBICORT) 160-4.5  MCG/ACT inhaler Inhale 2 puffs into the lungs 2 (two) times daily.    [provider]  CALCIUM CITRATE PO Take 1 tablet by mouth daily.     [provider]  Cholecalciferol (VITAMIN D3) 2000 UNITS capsule Take 2,000 Units by mouth daily.    [provider]  furosemide (LASIX) 20 MG tablet Take 20 mg by mouth 2 (two) times daily.     [provider]  gabapentin (NEURONTIN) 100 MG capsule Take 1 capsule  (100 mg total) by mouth 3 (three) times daily. 04/19/17   Kirsteins, Luanna Salk, MD  lisinopril (PRINIVIL,ZESTRIL) 10 MG tablet TAKE ONE TABLET BY MOUTH ONCE DAILY 05/03/17   Hilty, Nadean Corwin, MD  metoprolol tartrate (LOPRESSOR) 50 MG tablet Take 1 tablet (50 mg total) by mouth 2 (two) times daily with a meal. 09/15/14   Ghimire, Henreitta Leber, MD  Multiple Vitamin (MULTIVITAMIN) capsule Take 1 capsule by mouth daily. Contains iron    [provider]  oxybutynin (DITROPAN) 5 MG tablet Take 2.5 mg by mouth 2 (two) times daily.    [provider]  rosuvastatin (CRESTOR) 40 MG tablet Take 1 tablet (40 mg total) by mouth daily at 6 PM. 09/15/14   Ghimire, Henreitta Leber, MD  ticagrelor (BRILINTA) 90 MG TABS tablet Take by mouth 2 (two) times daily.    Pixie Casino, MD  traMADol (ULTRAM) 50 MG tablet Take 1-2 tablets (50-100 mg total) by mouth every 6 (six) hours as needed. 01/18/17   Kirsteins, Luanna Salk, MD  vitamin C (ASCORBIC ACID) 500 MG tablet Take 500 mg by mouth daily.    [provider]    Family History Family History  Problem Relation Age of Onset  . Heart disease Mother        also HTN  . Diabetes Mother   . Colon cancer Mother   . Heart disease Brother        deceased at 59  . Hypertension Brother   . Heart attack Brother   . Heart disease Brother   . Hypertension Brother     Social History Social History   Tobacco Use  . Smoking status: Current Every Day Smoker    Packs/day: 1.00    Years: 60.00    Pack years: 60.00  . Smokeless tobacco: Never Used  . Tobacco comment: currently smokes 1/2ppd or less  Substance Use Topics  . Alcohol use: No  . Drug use: No     Allergies   Buprenorphine hcl; Morphine and related; Celebrex [celecoxib]; Codeine; and Vioxx [rofecoxib]   Review of Systems Review of Systems  Cardiovascular: Negative for chest pain.  Musculoskeletal: Positive for arthralgias and joint swelling.  Neurological: Negative for headaches.    All other systems reviewed and are negative.    Physical Exam Updated Vital Signs BP 127/69   Pulse 63   Temp 98.3 F (36.8 C) (Oral)   Resp 16   SpO2 96%   Physical Exam CONSTITUTIONAL: elderly HEAD: Normocephalic/atraumatic EYES: EOMI ENMT: Mucous membranes moist NECK: supple no meningeal signs SPINE/BACK:entire spine nontender CV: S1/S2 noted, no murmurs/rubs/gallops noted LUNGS: Lungs are clear to auscultation bilaterally, no apparent distress ABDOMEN: soft NEURO: Pt is awake/alert/appropriate, moves all extremitiesx4.  No facial droop.   EXTREMITIES: pulses normal/equal, full ROM, tenderness and swelling to left wrist, no lacerations or abrasions, distal pulses intact, mild tenderness to the proximal aspect of left hand but no hand deformity, she is able to move all fingers on  left hand SKIN: warm, color normal PSYCH: no abnormalities of mood noted, alert and oriented to situation   ED Treatments / Results  Labs (all labs ordered are listed, but only abnormal results are displayed) Labs Reviewed - No data to display  EKG  EKG Interpretation None       Radiology Dg Forearm Left  Result Date: 05/27/2017 CLINICAL DATA:  77 year old female with fall and left wrist pain. EXAM: LEFT WRIST - COMPLETE 3+ VIEW; LEFT FOREARM - 2 VIEW COMPARISON:  Left wrist radiograph dated 09/29/2007 FINDINGS: There is a mildly displaced fracture of the distal radius with dorsal angulation of the distal fracture fragment. A small displaced fracture fragment noted along the volar aspect of the distal radius. Probable nondisplaced fracture of the ulnar-styloid. No other fracture identified. There is no dislocation. There is associated positive ulnar variance. The bones are osteopenic. There is soft tissue swelling of the wrist. IMPRESSION: Fracture of the distal radius with dorsal angulation of the distal fracture fragment as well as probable nondisplaced fracture of the ulnar-styloid. This  results in a positive ulnar variance. Electronically Signed   By: Anner Crete M.D.   On: 05/27/2017 03:16   Dg Wrist Complete Left  Result Date: 05/27/2017 CLINICAL DATA:  77 year old female with fall and left wrist pain. EXAM: LEFT WRIST - COMPLETE 3+ VIEW; LEFT FOREARM - 2 VIEW COMPARISON:  Left wrist radiograph dated 09/29/2007 FINDINGS: There is a mildly displaced fracture of the distal radius with dorsal angulation of the distal fracture fragment. A small displaced fracture fragment noted along the volar aspect of the distal radius. Probable nondisplaced fracture of the ulnar-styloid. No other fracture identified. There is no dislocation. There is associated positive ulnar variance. The bones are osteopenic. There is soft tissue swelling of the wrist. IMPRESSION: Fracture of the distal radius with dorsal angulation of the distal fracture fragment as well as probable nondisplaced fracture of the ulnar-styloid. This results in a positive ulnar variance. Electronically Signed   By: Anner Crete M.D.   On: 05/27/2017 03:16    Procedures Procedures  SPLINT APPLICATION Date/Time: 7:41 AM Authorized by: Sharyon Cable Consent: Verbal consent obtained. Risks and benefits: risks, benefits and alternatives were discussed Consent given by: patient Splint applied by: orthopedic technician Location details: left arm Splint type: sugartong Supplies used: ortho glass Post-procedure: The splinted body part was neurovascularly unchanged following the procedure. Patient tolerance: Patient tolerated the procedure well with no immediate complications.   Medications Ordered in ED Medications - No data to display   Initial Impression / Assessment and Plan / ED Course  I have reviewed the triage vital signs and the nursing notes.  Pertinent  imaging results that were available during my care of the patient were reviewed by me and considered in my medical decision making (see chart for  details). Narcotic database reviewed and considered in decision making    4:12 AM Patient improved, tolerated wrist splint She already has pain meds at home Plan to DC home, she will follow-up with hand surgery in 2 days   Final Clinical Impressions(s) / ED Diagnoses   Final diagnoses:  Closed Colles' fracture of left radius, initial encounter    ED Discharge Orders    None       Ripley Fraise, MD 05/27/17 [phone]

## 2017-05-27 NOTE — ED Triage Notes (Signed)
Pt was trying to get her dog inside when she slipped, falling back onto her butt and bracing herself onto the L wrist. Swelling and deformity noted to L forearm. Denies LOC

## 2017-05-27 NOTE — Progress Notes (Signed)
Orthopedic Tech Progress Note Patient Details:  Sara Fox August 26, 1940 [id number]  Ortho Devices Type of Ortho Device: Arm sling, Sugartong splint Ortho Device/Splint Location: lue Ortho Device/Splint Interventions: Ordered, Application, Adjustment   Post Interventions Patient Tolerated: Well Instructions Provided: Care of device, Adjustment of device   Karolee Stamps 05/27/2017, 7:39 AM

## 2017-05-27 NOTE — ED Notes (Signed)
Ortho tech notified.  

## 2017-05-28 DIAGNOSIS — S52502A Unspecified fracture of the lower end of left radius, initial encounter for closed fracture: Secondary | ICD-10-CM | POA: Insufficient documentation

## 2017-05-28 DIAGNOSIS — S52572A Other intraarticular fracture of lower end of left radius, initial encounter for closed fracture: Secondary | ICD-10-CM | POA: Diagnosis not present

## 2017-05-28 HISTORY — DX: Unspecified fracture of the lower end of left radius, initial encounter for closed fracture: S52.502A

## 2017-05-30 ENCOUNTER — Telehealth: Payer: Self-pay | Admitting: *Deleted

## 2017-05-30 NOTE — Telephone Encounter (Signed)
   [facility] Pre-operative Risk Assessment    Request for surgical clearance:  1. What type of surgery is being performed? Open Reduction Internal Fixation Left Distal Radius Fracture and Left Carpal Tunnel Release   2. When is this surgery scheduled? 06/01/17   3. Are there any medications that need to be held prior to surgery and how long?   4. Practice name and name of physician performing surgery? Dr. Charlotte Crumb, [facility]   5. What is your office phone and fax number? [phone], Fax: [phone]   6. Anesthesia type (None, local, MAC, general) ? General and Axillary Block   Sara Fox 05/30/2017, 8:17 AM  _________________________________________________________________   (provider comments below)

## 2017-05-30 NOTE — Telephone Encounter (Signed)
Follow up     Pam from office [phone] calling for status of pre op clearance

## 2017-05-30 NOTE — Telephone Encounter (Signed)
   Primary Cardiologist:Kenneth C Hilty, MD  Chart reviewed as part of pre-operative protocol coverage. Because of Sara Fox's past medical history and time since last visit, he/she will require a follow-up visit in order to better assess preoperative cardiovascular risk.  Pre-op covering staff: - Please schedule appointment and call patient to inform them. - Please contact requesting surgeon's office via preferred method (i.e, phone, fax) to inform them of need for appointment prior to surgery.  Please comment on holding ASA and brilinta within the office visit note.  Tami Lin Duke, PA  05/30/2017, 3:54 PM

## 2017-05-31 ENCOUNTER — Ambulatory Visit: Payer: Medicare Other | Admitting: Physical Medicine & Rehabilitation

## 2017-05-31 NOTE — Telephone Encounter (Signed)
Discussed with Dr Debara Pickett- pt's surgery is relatively urgent and she has done well from a cardiac standpoint. OK to have surgery without further cardiac work up or office visit.   Kerin Ransom PA-C 05/31/2017 3:17 PM

## 2017-06-01 DIAGNOSIS — S52572A Other intraarticular fracture of lower end of left radius, initial encounter for closed fracture: Secondary | ICD-10-CM | POA: Diagnosis not present

## 2017-06-01 DIAGNOSIS — G5602 Carpal tunnel syndrome, left upper limb: Secondary | ICD-10-CM | POA: Diagnosis not present

## 2017-06-05 ENCOUNTER — Ambulatory Visit: Payer: Medicare Other | Admitting: Physical Medicine & Rehabilitation

## 2017-06-05 DIAGNOSIS — S52572A Other intraarticular fracture of lower end of left radius, initial encounter for closed fracture: Secondary | ICD-10-CM | POA: Diagnosis not present

## 2017-06-05 DIAGNOSIS — S52572D Other intraarticular fracture of lower end of left radius, subsequent encounter for closed fracture with routine healing: Secondary | ICD-10-CM | POA: Diagnosis not present

## 2017-06-05 DIAGNOSIS — R2689 Other abnormalities of gait and mobility: Secondary | ICD-10-CM | POA: Insufficient documentation

## 2017-06-07 ENCOUNTER — Encounter: Payer: Self-pay | Admitting: Physical Medicine & Rehabilitation

## 2017-06-07 ENCOUNTER — Encounter: Payer: Medicare Other | Attending: Physical Medicine & Rehabilitation

## 2017-06-07 ENCOUNTER — Ambulatory Visit: Payer: Medicare Other | Admitting: Physical Medicine & Rehabilitation

## 2017-06-07 VITALS — BP 137/70 | HR 76

## 2017-06-07 DIAGNOSIS — G8929 Other chronic pain: Secondary | ICD-10-CM | POA: Diagnosis not present

## 2017-06-07 DIAGNOSIS — R413 Other amnesia: Secondary | ICD-10-CM | POA: Diagnosis not present

## 2017-06-07 DIAGNOSIS — M533 Sacrococcygeal disorders, not elsewhere classified: Secondary | ICD-10-CM

## 2017-06-07 DIAGNOSIS — R2689 Other abnormalities of gait and mobility: Secondary | ICD-10-CM | POA: Diagnosis not present

## 2017-06-07 DIAGNOSIS — M961 Postlaminectomy syndrome, not elsewhere classified: Secondary | ICD-10-CM

## 2017-06-07 DIAGNOSIS — M5442 Lumbago with sciatica, left side: Secondary | ICD-10-CM | POA: Diagnosis not present

## 2017-06-07 MED ORDER — GABAPENTIN 100 MG PO CAPS: 100.0000 mg | ORAL_CAPSULE | Freq: Every day | ORAL | 1 refills | Status: DC

## 2017-06-07 NOTE — Patient Instructions (Addendum)
Gabapentin take 100mg  at night  Tramadol 50mg  1 tablet 4 times a day
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
## 2017-06-28 DIAGNOSIS — S52572D Other intraarticular fracture of lower end of left radius, subsequent encounter for closed fracture with routine healing: Secondary | ICD-10-CM | POA: Diagnosis not present

## 2017-07-06 DIAGNOSIS — R413 Other amnesia: Secondary | ICD-10-CM | POA: Diagnosis not present

## 2017-07-06 DIAGNOSIS — N390 Urinary tract infection, site not specified: Secondary | ICD-10-CM | POA: Diagnosis not present

## 2017-07-06 DIAGNOSIS — I5032 Chronic diastolic (congestive) heart failure: Secondary | ICD-10-CM | POA: Diagnosis not present

## 2017-07-06 DIAGNOSIS — I10 Essential (primary) hypertension: Secondary | ICD-10-CM | POA: Diagnosis not present

## 2017-07-06 DIAGNOSIS — R609 Edema, unspecified: Secondary | ICD-10-CM | POA: Diagnosis not present

## 2017-07-06 DIAGNOSIS — R319 Hematuria, unspecified: Secondary | ICD-10-CM | POA: Diagnosis not present

## 2017-07-10 ENCOUNTER — Inpatient Hospital Stay (HOSPITAL_COMMUNITY)
Admission: EM | Admit: 2017-07-10 | Discharge: 2017-07-20 | DRG: 682 | Disposition: A | Discharge status: Skilled Nursing Facility | Payer: Medicare Other | Attending: Family Medicine | Admitting: Family Medicine

## 2017-07-10 ENCOUNTER — Encounter (HOSPITAL_COMMUNITY): Payer: Self-pay | Admitting: Emergency Medicine

## 2017-07-10 DIAGNOSIS — K284 Chronic or unspecified gastrojejunal ulcer with hemorrhage: Secondary | ICD-10-CM | POA: Diagnosis present

## 2017-07-10 DIAGNOSIS — N189 Chronic kidney disease, unspecified: Secondary | ICD-10-CM | POA: Diagnosis present

## 2017-07-10 DIAGNOSIS — Z7951 Long term (current) use of inhaled steroids: Secondary | ICD-10-CM

## 2017-07-10 DIAGNOSIS — B3781 Candidal esophagitis: Secondary | ICD-10-CM | POA: Diagnosis not present

## 2017-07-10 DIAGNOSIS — N170 Acute kidney failure with tubular necrosis: Secondary | ICD-10-CM | POA: Diagnosis not present

## 2017-07-10 DIAGNOSIS — Y92230 Patient room in hospital as the place of occurrence of the external cause: Secondary | ICD-10-CM | POA: Diagnosis present

## 2017-07-10 DIAGNOSIS — Z800 Family history of malignant neoplasm of digestive organs: Secondary | ICD-10-CM

## 2017-07-10 DIAGNOSIS — L89152 Pressure ulcer of sacral region, stage 2: Secondary | ICD-10-CM | POA: Diagnosis present

## 2017-07-10 DIAGNOSIS — Z791 Long term (current) use of non-steroidal anti-inflammatories (NSAID): Secondary | ICD-10-CM

## 2017-07-10 DIAGNOSIS — K759 Inflammatory liver disease, unspecified: Secondary | ICD-10-CM | POA: Diagnosis not present

## 2017-07-10 DIAGNOSIS — R945 Abnormal results of liver function studies: Secondary | ICD-10-CM

## 2017-07-10 DIAGNOSIS — L8990 Pressure ulcer of unspecified site, unspecified stage: Secondary | ICD-10-CM

## 2017-07-10 DIAGNOSIS — Z7982 Long term (current) use of aspirin: Secondary | ICD-10-CM

## 2017-07-10 DIAGNOSIS — I130 Hypertensive heart and chronic kidney disease with heart failure and stage 1 through stage 4 chronic kidney disease, or unspecified chronic kidney disease: Secondary | ICD-10-CM | POA: Diagnosis present

## 2017-07-10 DIAGNOSIS — Z79891 Long term (current) use of opiate analgesic: Secondary | ICD-10-CM

## 2017-07-10 DIAGNOSIS — Z7401 Bed confinement status: Secondary | ICD-10-CM | POA: Diagnosis not present

## 2017-07-10 DIAGNOSIS — R197 Diarrhea, unspecified: Secondary | ICD-10-CM | POA: Diagnosis present

## 2017-07-10 DIAGNOSIS — G9349 Other encephalopathy: Secondary | ICD-10-CM | POA: Diagnosis not present

## 2017-07-10 DIAGNOSIS — Z9049 Acquired absence of other specified parts of digestive tract: Secondary | ICD-10-CM | POA: Diagnosis not present

## 2017-07-10 DIAGNOSIS — Z833 Family history of diabetes mellitus: Secondary | ICD-10-CM

## 2017-07-10 DIAGNOSIS — R7989 Other specified abnormal findings of blood chemistry: Secondary | ICD-10-CM | POA: Diagnosis present

## 2017-07-10 DIAGNOSIS — E860 Dehydration: Secondary | ICD-10-CM

## 2017-07-10 DIAGNOSIS — T380X5A Adverse effect of glucocorticoids and synthetic analogues, initial encounter: Secondary | ICD-10-CM | POA: Diagnosis present

## 2017-07-10 DIAGNOSIS — M6282 Rhabdomyolysis: Secondary | ICD-10-CM

## 2017-07-10 DIAGNOSIS — Z955 Presence of coronary angioplasty implant and graft: Secondary | ICD-10-CM

## 2017-07-10 DIAGNOSIS — Z885 Allergy status to narcotic agent status: Secondary | ICD-10-CM | POA: Diagnosis not present

## 2017-07-10 DIAGNOSIS — E669 Obesity, unspecified: Secondary | ICD-10-CM | POA: Diagnosis present

## 2017-07-10 DIAGNOSIS — Z8541 Personal history of malignant neoplasm of cervix uteri: Secondary | ICD-10-CM

## 2017-07-10 DIAGNOSIS — E785 Hyperlipidemia, unspecified: Secondary | ICD-10-CM | POA: Diagnosis not present

## 2017-07-10 DIAGNOSIS — I2510 Atherosclerotic heart disease of native coronary artery without angina pectoris: Secondary | ICD-10-CM | POA: Diagnosis present

## 2017-07-10 DIAGNOSIS — R600 Localized edema: Secondary | ICD-10-CM | POA: Diagnosis not present

## 2017-07-10 DIAGNOSIS — I5022 Chronic systolic (congestive) heart failure: Secondary | ICD-10-CM | POA: Diagnosis present

## 2017-07-10 DIAGNOSIS — E8809 Other disorders of plasma-protein metabolism, not elsewhere classified: Secondary | ICD-10-CM | POA: Diagnosis not present

## 2017-07-10 DIAGNOSIS — D500 Iron deficiency anemia secondary to blood loss (chronic): Secondary | ICD-10-CM | POA: Diagnosis present

## 2017-07-10 DIAGNOSIS — E876 Hypokalemia: Secondary | ICD-10-CM | POA: Diagnosis present

## 2017-07-10 DIAGNOSIS — R296 Repeated falls: Secondary | ICD-10-CM | POA: Diagnosis not present

## 2017-07-10 DIAGNOSIS — Z9884 Bariatric surgery status: Secondary | ICD-10-CM

## 2017-07-10 DIAGNOSIS — Z888 Allergy status to other drugs, medicaments and biological substances status: Secondary | ICD-10-CM | POA: Diagnosis not present

## 2017-07-10 DIAGNOSIS — Z8249 Family history of ischemic heart disease and other diseases of the circulatory system: Secondary | ICD-10-CM

## 2017-07-10 DIAGNOSIS — E873 Alkalosis: Secondary | ICD-10-CM | POA: Diagnosis not present

## 2017-07-10 DIAGNOSIS — Z6827 Body mass index (BMI) 27.0-27.9, adult: Secondary | ICD-10-CM

## 2017-07-10 DIAGNOSIS — D649 Anemia, unspecified: Secondary | ICD-10-CM | POA: Diagnosis present

## 2017-07-10 DIAGNOSIS — W1830XA Fall on same level, unspecified, initial encounter: Secondary | ICD-10-CM | POA: Diagnosis present

## 2017-07-10 DIAGNOSIS — E440 Moderate protein-calorie malnutrition: Secondary | ICD-10-CM | POA: Diagnosis present

## 2017-07-10 DIAGNOSIS — K921 Melena: Secondary | ICD-10-CM

## 2017-07-10 DIAGNOSIS — N179 Acute kidney failure, unspecified: Secondary | ICD-10-CM

## 2017-07-10 DIAGNOSIS — Z79899 Other long term (current) drug therapy: Secondary | ICD-10-CM

## 2017-07-10 DIAGNOSIS — Z90710 Acquired absence of both cervix and uterus: Secondary | ICD-10-CM

## 2017-07-10 LAB — HEPATIC FUNCTION PANEL
ALT: 843 U/L — ABNORMAL HIGH (ref 14–54)
AST: 1239 U/L — ABNORMAL HIGH (ref 15–41)
Albumin: 2.7 g/dL — ABNORMAL LOW (ref 3.5–5.0)
Alkaline Phosphatase: 102 U/L (ref 38–126)
Bilirubin, Direct: 0.1 mg/dL (ref 0.1–0.5)
Indirect Bilirubin: 0.7 mg/dL (ref 0.3–0.9)
Total Bilirubin: 0.8 mg/dL (ref 0.3–1.2)
Total Protein: 5.9 g/dL — ABNORMAL LOW (ref 6.5–8.1)

## 2017-07-10 LAB — CBC
HCT: 30 % — ABNORMAL LOW (ref 36.0–46.0)
Hemoglobin: 9.7 g/dL — ABNORMAL LOW (ref 12.0–15.0)
MCH: 31.8 pg (ref 26.0–34.0)
MCHC: 32.3 g/dL (ref 30.0–36.0)
MCV: 98.4 fL (ref 78.0–100.0)
Platelets: 335 10*3/uL (ref 150–400)
RBC: 3.05 MIL/uL — ABNORMAL LOW (ref 3.87–5.11)
RDW: 15.1 % (ref 11.5–15.5)
WBC: 9 10*3/uL (ref 4.0–10.5)

## 2017-07-10 LAB — URINALYSIS, ROUTINE W REFLEX MICROSCOPIC
Bilirubin Urine: NEGATIVE
Glucose, UA: NEGATIVE mg/dL
Ketones, ur: NEGATIVE mg/dL
Leukocytes, UA: NEGATIVE
Nitrite: NEGATIVE
Protein, ur: 100 mg/dL — AB
Specific Gravity, Urine: 1.015 (ref 1.005–1.030)
pH: 5 (ref 5.0–8.0)

## 2017-07-10 LAB — BASIC METABOLIC PANEL
Anion gap: 15 (ref 5–15)
BUN: 43 mg/dL — ABNORMAL HIGH (ref 6–20)
CO2: 19 mmol/L — ABNORMAL LOW (ref 22–32)
Calcium: 8.3 mg/dL — ABNORMAL LOW (ref 8.9–10.3)
Chloride: 107 mmol/L (ref 101–111)
Creatinine, Ser: 3.82 mg/dL — ABNORMAL HIGH (ref 0.44–1.00)
GFR calc Af Amer: 12 mL/min — ABNORMAL LOW (ref >60)
GFR calc non Af Amer: 10 mL/min — ABNORMAL LOW (ref >60)
Glucose, Bld: 112 mg/dL — ABNORMAL HIGH (ref 65–99)
Potassium: 2.8 mmol/L — ABNORMAL LOW (ref 3.5–5.1)
Sodium: 141 mmol/L (ref 135–145)

## 2017-07-10 MED ORDER — POTASSIUM CHLORIDE 10 MEQ/100ML IV SOLN
10.0000 meq | Freq: Once | INTRAVENOUS | Status: AC
  Administered 2017-07-11: 10 meq via INTRAVENOUS
  Filled 2017-07-10: qty 100

## 2017-07-10 MED ORDER — SODIUM CHLORIDE 0.9 % IV BOLUS (SEPSIS)
1000.0000 mL | Freq: Once | INTRAVENOUS | Status: AC
  Administered 2017-07-10: 1000 mL via INTRAVENOUS
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
## 2017-07-10 NOTE — ED Notes (Signed)
Bladder scan result of 241mL

## 2017-07-10 NOTE — ED Provider Notes (Addendum)
[facility] DEPT Provider Note   CSN: [id number] Arrival date & time: 07/10/17  1735     History   Chief Complaint Chief Complaint  Patient presents with  . Abnormal Lab  . Weakness    HPI Sara Fox is a 77 y.o. female.  HPI Pt has been having trouble with weakness and confusion.  This has progressed over the past week.  She has been having some loose stools this week.  She has been going multiple times.  Maybe 10 times per day.  Last episode was last night. She has been getting disoriented.  She went to her doctor today and had blood tests.  She was called back and told to come to the ED.  No recent abx.  No recent travel.   Family denies tylenol use. Past Medical History:  Diagnosis Date  . Back pain   . Cervical cancer ([place])   . Hyperlipidemia   . Hypertension   . Neck pain   . Stomach problems     Patient Active Problem List   Diagnosis Date Noted  . Shortness of breath at rest 12/21/2016  . Snoring 12/21/2016  . OSA and COPD overlap syndrome ([place]) 12/21/2016  . Edema of both ankles 12/21/2016  . Gait disorder 02/05/2015  . Peripheral musculoskeletal gait disorder 02/05/2015  . Lumbar post-laminectomy syndrome 02/05/2015  . Coronary artery disease involving native coronary artery of native heart without angina pectoris 10/16/2014  . NSTEMI (non-ST elevated myocardial infarction) ([place]) 09/13/2014  . Cardiomyopathy, ischemic 09/13/2014  . Chronic systolic heart failure ([place])   . Chest pain 09/12/2014  . Tobacco abuse 09/12/2014  . Dyslipidemia 09/12/2014  . CKD (chronic kidney disease), stage II 09/12/2014  . Anemia 09/12/2014  . Chronic diastolic heart failure, NYHA class 1 ([place]) 09/12/2014  . Sacroiliac dysfunction 10/10/2013  . Postlaminectomy syndrome, cervical region 08/01/2013  . Chronic midline low back pain with left-sided sciatica 08/01/2013  . Shoulder joint contracture 08/01/2013  . HTN (hypertension) 03/03/2013    . Abnormal genetic test 03/03/2013    Past Surgical History:  Procedure Laterality Date  . ABDOMINAL HYSTERECTOMY    . BACK SURGERY  2012   lower back  . CARDIAC CATHETERIZATION  06/1999   noncritical disease invovling PDA  . CARDIAC CATHETERIZATION N/A 09/14/2014   Procedure: Left Heart Cath and Coronary Angiography;  Surgeon: Leonie Man, MD;  Location: [facility] INVASIVE CV LAB CUPID;  Service: Cardiovascular;  Laterality: N/A;  . CHOLECYSTECTOMY    . KNEE SURGERY Bilateral 2001 & 2007  . NECK SURGERY  2012   2012  . PARTIAL GASTRECTOMY  2005   subtotal  . PERCUTANEOUS CORONARY STENT INTERVENTION (PCI-S)  09/14/2014   Procedure: Percutaneous Coronary Stent Intervention (Pci-S);  Surgeon: Leonie Man, MD;  Location: [facility] INVASIVE CV LAB CUPID;  Service: Cardiovascular;;  . SHOULDER SURGERY Right   . TRANSTHORACIC ECHOCARDIOGRAM  06/02/2010   EF=>55%, normal LV systolic function; normal RV systolic function; mild mitral annular calcif; trace TR; AV mildly sclerotic    OB History    No data available       Home Medications    Prior to Admission medications   Medication Sig Start Date End Date Taking? Authorizing Provider  aspirin 81 MG tablet Take 1 tablet (81 mg total) by mouth daily. 09/15/14  Yes Ghimire, Henreitta Leber, MD  CALCIUM CITRATE PO Take 1 tablet by mouth daily.    Yes [provider]  Cholecalciferol (VITAMIN D3)  2000 UNITS capsule Take 2,000 Units by mouth daily.   Yes [provider]  furosemide (LASIX) 20 MG tablet Take 20 mg by mouth 2 (two) times daily.    Yes [provider]  gabapentin (NEURONTIN) 100 MG capsule Take 1 capsule (100 mg total) by mouth at bedtime. 06/07/17  Yes Kirsteins, Luanna Salk, MD  lisinopril (PRINIVIL,ZESTRIL) 10 MG tablet TAKE ONE TABLET BY MOUTH ONCE DAILY 05/03/17  Yes Hilty, Nadean Corwin, MD  metoprolol tartrate (LOPRESSOR) 50 MG tablet Take 1 tablet (50 mg total) by mouth 2 (two) times daily with a meal. 09/15/14  Yes  Ghimire, Henreitta Leber, MD  Multiple Vitamin (MULTIVITAMIN) capsule Take 1 capsule by mouth daily. Contains iron   Yes [provider]  oxybutynin (DITROPAN) 5 MG tablet Take 2.5 mg by mouth 2 (two) times daily.   Yes [provider]  rosuvastatin (CRESTOR) 40 MG tablet Take 1 tablet (40 mg total) by mouth daily at 6 PM. 09/15/14  Yes Ghimire, Henreitta Leber, MD  ticagrelor (BRILINTA) 90 MG TABS tablet Take 90 mg by mouth 2 (two) times daily.    Yes Hilty, Nadean Corwin, MD  traMADol (ULTRAM) 50 MG tablet Take 1-2 tablets (50-100 mg total) by mouth every 6 (six) hours as needed. 01/18/17  Yes Kirsteins, Luanna Salk, MD  vitamin C (ASCORBIC ACID) 500 MG tablet Take 500 mg by mouth daily.   Yes [provider]  budesonide-formoterol (SYMBICORT) 160-4.5 MCG/ACT inhaler Inhale 2 puffs into the lungs 2 (two) times daily.    [provider]    Family History Family History  Problem Relation Age of Onset  . Heart disease Mother        also HTN  . Diabetes Mother   . Colon cancer Mother   . Heart disease Brother        deceased at 39  . Hypertension Brother   . Heart attack Brother   . Heart disease Brother   . Hypertension Brother     Social History Social History   Tobacco Use  . Smoking status: Current Every Day Smoker    Packs/day: 1.00    Years: 60.00    Pack years: 60.00  . Smokeless tobacco: Never Used  . Tobacco comment: currently smokes 1/2ppd or less  Substance Use Topics  . Alcohol use: No  . Drug use: No     Allergies   Buprenorphine hcl; Celebrex [celecoxib]; Morphine and related; Codeine; and Vioxx [rofecoxib]   Review of Systems Review of Systems  All other systems reviewed and are negative.    Physical Exam Updated Vital Signs BP 126/69 (BP Location: Left Arm)   Pulse 78   Temp 98 F (36.7 C) (Oral)   Resp 14   SpO2 98%   Physical Exam  Constitutional: She appears listless. No distress.  HENT:  Head: Normocephalic and atraumatic.    Right Ear: External ear normal.  Left Ear: External ear normal.  Eyes: Conjunctivae are normal. Right eye exhibits no discharge. Left eye exhibits no discharge. No scleral icterus.  Neck: Neck supple. No tracheal deviation present.  Cardiovascular: Normal rate, regular rhythm and intact distal pulses.  Pulmonary/Chest: Effort normal and breath sounds normal. No stridor. No respiratory distress. She has no wheezes. She has no rales.  Abdominal: Soft. Bowel sounds are normal. She exhibits no distension. There is no tenderness. There is no rebound and no guarding.  Musculoskeletal: She exhibits edema. She exhibits no tenderness.  Pitting edema bilateral lower  extremities  Neurological: She has normal strength. She appears listless. No cranial nerve deficit (no facial droop, extraocular movements intact, no slurred speech) or sensory deficit. She exhibits normal muscle tone. She displays no seizure activity. Coordination normal.  Skin: Skin is warm. No rash noted. She is not diaphoretic.  Psychiatric: She has a normal mood and affect.  Nursing note and vitals reviewed.    ED Treatments / Results  Labs (all labs ordered are listed, but only abnormal results are displayed) Labs Reviewed  BASIC METABOLIC PANEL - Abnormal; Notable for the following components:      Result Value   Potassium 2.8 (*)    CO2 19 (*)    Glucose, Bld 112 (*)    BUN 43 (*)    Creatinine, Ser 3.82 (*)    Calcium 8.3 (*)    GFR calc non Af Amer 10 (*)    GFR calc Af Amer 12 (*)    All other components within normal limits  CBC - Abnormal; Notable for the following components:   RBC 3.05 (*)    Hemoglobin 9.7 (*)    HCT 30.0 (*)    All other components within normal limits  URINALYSIS, ROUTINE W REFLEX MICROSCOPIC - Abnormal; Notable for the following components:   Color, Urine AMBER (*)    APPearance HAZY (*)    Hgb urine dipstick LARGE (*)    Protein, ur 100 (*)    Bacteria, UA RARE (*)    Squamous  Epithelial / LPF 0-5 (*)    All other components within normal limits  HEPATIC FUNCTION PANEL - Abnormal; Notable for the following components:   Total Protein 5.9 (*)    Albumin 2.7 (*)    AST 1,239 (*)    ALT 843 (*)    All other components within normal limits  CK  MAGNESIUM     Radiology [place] pending  Procedures Procedures (including critical care time)  Medications Ordered in ED Medications  potassium chloride 10 mEq in 100 mL IVPB (not administered)  sodium chloride 0.9 % bolus 1,000 mL (1,000 mLs Intravenous New Bag/Given 07/10/17 2245)     Initial Impression / Assessment and Plan / ED Course  I have reviewed the triage vital signs and the nursing notes.  Pertinent labs & imaging results that were available during my care of the patient were reviewed by me and considered in my medical decision making (see chart for details).  Clinical Course as of Jul 11 2351  Tue Jul 10, 2017  2225 Laboratory tests reviewed.  Patient's anemia is stable.  Not significantly changed from laboratory test 2 years ago.  Patient's electrolyte panel however is significantly changed.  She has new acute renal failure as well as hypokalemia.  [JK]  2351 Bladder scan is 200.  No urinary retention  [JK]  2352 LFTs are also significantly elevated.  Patient is not having any abdominal pain or tenderness on exam  [JK]    Clinical Course User Index [JK] Dorie Rank, MD   Patient presented to the emergency room for evaluation of weakness.  Patient has acute kidney injury as well as acute hepatitis which are the likely causes of her confusion and weakness.  ? Etioogy.  ?viral hepatitis, prerenal etiology?  Patient has been hydrated with IV fluids.  Plan on abd [place].  Admit for further evaluation.   Final Clinical Impressions(s) / ED Diagnoses   Final diagnoses:  AKI (acute kidney injury) ([place])  Hepatitis  Dehydration  Dorie Rank, MD 07/10/17 [phone]

## 2017-07-10 NOTE — ED Triage Notes (Addendum)
Pt reports she has been having R leg weakness for the past week and has been more confused than normal. No n/v/d. Pt alert and oriented in triage.  Went to her PCP for this last week. Called her PCP today for follow up on her labwork, and was told to come here for dehydration. Family reports the doctor told them she had hematuria and elevated liver function levels.

## 2017-07-11 ENCOUNTER — Inpatient Hospital Stay (HOSPITAL_COMMUNITY)
Admit: 2017-07-11 | Discharge: 2017-07-11 | Disposition: A | Discharge status: Home / Self Care | Payer: Medicare Other | Attending: Internal Medicine | Admitting: Internal Medicine

## 2017-07-11 ENCOUNTER — Inpatient Hospital Stay (HOSPITAL_COMMUNITY): Payer: Medicare Other

## 2017-07-11 ENCOUNTER — Other Ambulatory Visit: Payer: Self-pay

## 2017-07-11 DIAGNOSIS — R7989 Other specified abnormal findings of blood chemistry: Secondary | ICD-10-CM | POA: Diagnosis not present

## 2017-07-11 DIAGNOSIS — Z7401 Bed confinement status: Secondary | ICD-10-CM | POA: Diagnosis not present

## 2017-07-11 DIAGNOSIS — M6281 Muscle weakness (generalized): Secondary | ICD-10-CM | POA: Diagnosis not present

## 2017-07-11 DIAGNOSIS — E876 Hypokalemia: Secondary | ICD-10-CM | POA: Diagnosis not present

## 2017-07-11 DIAGNOSIS — Z885 Allergy status to narcotic agent status: Secondary | ICD-10-CM | POA: Diagnosis not present

## 2017-07-11 DIAGNOSIS — E785 Hyperlipidemia, unspecified: Secondary | ICD-10-CM | POA: Diagnosis not present

## 2017-07-11 DIAGNOSIS — D500 Iron deficiency anemia secondary to blood loss (chronic): Secondary | ICD-10-CM | POA: Diagnosis not present

## 2017-07-11 DIAGNOSIS — I129 Hypertensive chronic kidney disease with stage 1 through stage 4 chronic kidney disease, or unspecified chronic kidney disease: Secondary | ICD-10-CM | POA: Diagnosis not present

## 2017-07-11 DIAGNOSIS — E860 Dehydration: Secondary | ICD-10-CM | POA: Diagnosis not present

## 2017-07-11 DIAGNOSIS — M7989 Other specified soft tissue disorders: Secondary | ICD-10-CM

## 2017-07-11 DIAGNOSIS — Z6827 Body mass index (BMI) 27.0-27.9, adult: Secondary | ICD-10-CM | POA: Diagnosis not present

## 2017-07-11 DIAGNOSIS — J449 Chronic obstructive pulmonary disease, unspecified: Secondary | ICD-10-CM | POA: Diagnosis not present

## 2017-07-11 DIAGNOSIS — E8770 Fluid overload, unspecified: Secondary | ICD-10-CM | POA: Diagnosis not present

## 2017-07-11 DIAGNOSIS — Z90710 Acquired absence of both cervix and uterus: Secondary | ICD-10-CM | POA: Diagnosis not present

## 2017-07-11 DIAGNOSIS — W1830XA Fall on same level, unspecified, initial encounter: Secondary | ICD-10-CM | POA: Diagnosis present

## 2017-07-11 DIAGNOSIS — Y92230 Patient room in hospital as the place of occurrence of the external cause: Secondary | ICD-10-CM | POA: Diagnosis present

## 2017-07-11 DIAGNOSIS — Z9884 Bariatric surgery status: Secondary | ICD-10-CM | POA: Diagnosis not present

## 2017-07-11 DIAGNOSIS — I130 Hypertensive heart and chronic kidney disease with heart failure and stage 1 through stage 4 chronic kidney disease, or unspecified chronic kidney disease: Secondary | ICD-10-CM | POA: Diagnosis not present

## 2017-07-11 DIAGNOSIS — N179 Acute kidney failure, unspecified: Secondary | ICD-10-CM | POA: Diagnosis present

## 2017-07-11 DIAGNOSIS — E873 Alkalosis: Secondary | ICD-10-CM | POA: Diagnosis not present

## 2017-07-11 DIAGNOSIS — B3781 Candidal esophagitis: Secondary | ICD-10-CM | POA: Diagnosis not present

## 2017-07-11 DIAGNOSIS — N289 Disorder of kidney and ureter, unspecified: Secondary | ICD-10-CM | POA: Diagnosis not present

## 2017-07-11 DIAGNOSIS — N189 Chronic kidney disease, unspecified: Secondary | ICD-10-CM | POA: Diagnosis present

## 2017-07-11 DIAGNOSIS — E8809 Other disorders of plasma-protein metabolism, not elsewhere classified: Secondary | ICD-10-CM | POA: Diagnosis present

## 2017-07-11 DIAGNOSIS — D509 Iron deficiency anemia, unspecified: Secondary | ICD-10-CM | POA: Diagnosis not present

## 2017-07-11 DIAGNOSIS — T380X5A Adverse effect of glucocorticoids and synthetic analogues, initial encounter: Secondary | ICD-10-CM | POA: Diagnosis present

## 2017-07-11 DIAGNOSIS — M6282 Rhabdomyolysis: Secondary | ICD-10-CM | POA: Diagnosis present

## 2017-07-11 DIAGNOSIS — N170 Acute kidney failure with tubular necrosis: Secondary | ICD-10-CM | POA: Diagnosis not present

## 2017-07-11 DIAGNOSIS — I2510 Atherosclerotic heart disease of native coronary artery without angina pectoris: Secondary | ICD-10-CM | POA: Diagnosis not present

## 2017-07-11 DIAGNOSIS — R600 Localized edema: Secondary | ICD-10-CM | POA: Diagnosis not present

## 2017-07-11 DIAGNOSIS — Z888 Allergy status to other drugs, medicaments and biological substances status: Secondary | ICD-10-CM | POA: Diagnosis not present

## 2017-07-11 DIAGNOSIS — K921 Melena: Secondary | ICD-10-CM | POA: Diagnosis not present

## 2017-07-11 DIAGNOSIS — Z9049 Acquired absence of other specified parts of digestive tract: Secondary | ICD-10-CM | POA: Diagnosis not present

## 2017-07-11 DIAGNOSIS — R296 Repeated falls: Secondary | ICD-10-CM | POA: Diagnosis present

## 2017-07-11 DIAGNOSIS — R945 Abnormal results of liver function studies: Secondary | ICD-10-CM

## 2017-07-11 DIAGNOSIS — Z8541 Personal history of malignant neoplasm of cervix uteri: Secondary | ICD-10-CM | POA: Diagnosis not present

## 2017-07-11 DIAGNOSIS — G9349 Other encephalopathy: Secondary | ICD-10-CM | POA: Diagnosis present

## 2017-07-11 DIAGNOSIS — D649 Anemia, unspecified: Secondary | ICD-10-CM | POA: Diagnosis not present

## 2017-07-11 DIAGNOSIS — E440 Moderate protein-calorie malnutrition: Secondary | ICD-10-CM | POA: Diagnosis not present

## 2017-07-11 DIAGNOSIS — I10 Essential (primary) hypertension: Secondary | ICD-10-CM | POA: Diagnosis not present

## 2017-07-11 DIAGNOSIS — K219 Gastro-esophageal reflux disease without esophagitis: Secondary | ICD-10-CM | POA: Diagnosis not present

## 2017-07-11 DIAGNOSIS — N182 Chronic kidney disease, stage 2 (mild): Secondary | ICD-10-CM | POA: Diagnosis not present

## 2017-07-11 DIAGNOSIS — L89152 Pressure ulcer of sacral region, stage 2: Secondary | ICD-10-CM | POA: Diagnosis present

## 2017-07-11 DIAGNOSIS — K284 Chronic or unspecified gastrojejunal ulcer with hemorrhage: Secondary | ICD-10-CM | POA: Diagnosis not present

## 2017-07-11 DIAGNOSIS — R740 Nonspecific elevation of levels of transaminase and lactic acid dehydrogenase [LDH]: Secondary | ICD-10-CM | POA: Diagnosis not present

## 2017-07-11 DIAGNOSIS — I517 Cardiomegaly: Secondary | ICD-10-CM | POA: Diagnosis not present

## 2017-07-11 DIAGNOSIS — I5022 Chronic systolic (congestive) heart failure: Secondary | ICD-10-CM | POA: Diagnosis present

## 2017-07-11 DIAGNOSIS — R278 Other lack of coordination: Secondary | ICD-10-CM | POA: Diagnosis not present

## 2017-07-11 LAB — CBC WITH DIFFERENTIAL/PLATELET
Basophils Absolute: 0 10*3/uL (ref 0.0–0.1)
Basophils Relative: 0 %
Eosinophils Absolute: 0 10*3/uL (ref 0.0–0.7)
Eosinophils Relative: 0 %
HCT: 27.8 % — ABNORMAL LOW (ref 36.0–46.0)
Hemoglobin: 9.1 g/dL — ABNORMAL LOW (ref 12.0–15.0)
Lymphocytes Relative: 7 %
Lymphs Abs: 0.7 10*3/uL (ref 0.7–4.0)
MCH: 31.9 pg (ref 26.0–34.0)
MCHC: 32.7 g/dL (ref 30.0–36.0)
MCV: 97.5 fL (ref 78.0–100.0)
Monocytes Absolute: 1.1 10*3/uL — ABNORMAL HIGH (ref 0.1–1.0)
Monocytes Relative: 11 %
Neutro Abs: 8 10*3/uL — ABNORMAL HIGH (ref 1.7–7.7)
Neutrophils Relative %: 82 %
Platelets: 316 10*3/uL (ref 150–400)
RBC: 2.85 MIL/uL — ABNORMAL LOW (ref 3.87–5.11)
RDW: 14.9 % (ref 11.5–15.5)
WBC: 9.9 10*3/uL (ref 4.0–10.5)

## 2017-07-11 LAB — COMPREHENSIVE METABOLIC PANEL
ALT: 740 U/L — ABNORMAL HIGH (ref 14–54)
AST: 992 U/L — ABNORMAL HIGH (ref 15–41)
Albumin: 2.3 g/dL — ABNORMAL LOW (ref 3.5–5.0)
Alkaline Phosphatase: 93 U/L (ref 38–126)
Anion gap: 12 (ref 5–15)
BUN: 47 mg/dL — ABNORMAL HIGH (ref 6–20)
CO2: 17 mmol/L — ABNORMAL LOW (ref 22–32)
Calcium: 7.6 mg/dL — ABNORMAL LOW (ref 8.9–10.3)
Chloride: 109 mmol/L (ref 101–111)
Creatinine, Ser: 3.92 mg/dL — ABNORMAL HIGH (ref 0.44–1.00)
GFR calc Af Amer: 12 mL/min — ABNORMAL LOW (ref >60)
GFR calc non Af Amer: 10 mL/min — ABNORMAL LOW (ref >60)
Glucose, Bld: 79 mg/dL (ref 65–99)
Potassium: 3.2 mmol/L — ABNORMAL LOW (ref 3.5–5.1)
Sodium: 138 mmol/L (ref 135–145)
Total Bilirubin: 0.7 mg/dL (ref 0.3–1.2)
Total Protein: 5.1 g/dL — ABNORMAL LOW (ref 6.5–8.1)

## 2017-07-11 LAB — CK
Total CK: 40213 U/L — ABNORMAL HIGH (ref 38–234)
Total CK: 26183 U/L — ABNORMAL HIGH (ref 38–234)

## 2017-07-11 LAB — TSH: TSH: 0.926 u[IU]/mL (ref 0.350–4.500)

## 2017-07-11 LAB — PREALBUMIN: Prealbumin: 16.3 mg/dL — ABNORMAL LOW (ref 18–38)

## 2017-07-11 LAB — MAGNESIUM: Magnesium: 2.3 mg/dL (ref 1.7–2.4)

## 2017-07-11 LAB — TYPE AND SCREEN
ABO/RH(D): O POS
Antibody Screen: NEGATIVE

## 2017-07-11 LAB — SODIUM, URINE, RANDOM: Sodium, Ur: 35 mmol/L

## 2017-07-11 LAB — ABO/RH: ABO/RH(D): O POS

## 2017-07-11 LAB — CREATININE, URINE, RANDOM: Creatinine, Urine: 84.01 mg/dL

## 2017-07-11 LAB — POC OCCULT BLOOD, ED: Fecal Occult Bld: POSITIVE — AB

## 2017-07-11 MED ORDER — SODIUM CHLORIDE 0.9 % IV BOLUS (SEPSIS)
500.0000 mL | Freq: Once | INTRAVENOUS | Status: AC
  Administered 2017-07-11: 500 mL via INTRAVENOUS

## 2017-07-11 MED ORDER — ACETAMINOPHEN 325 MG PO TABS
650.0000 mg | ORAL_TABLET | Freq: Four times a day (QID) | ORAL | Status: DC | PRN
  Administered 2017-07-18: 650 mg via ORAL
  Filled 2017-07-11: qty 2

## 2017-07-11 MED ORDER — SODIUM CHLORIDE 0.9 % IV SOLN
INTRAVENOUS | Status: DC
  Administered 2017-07-11 (×2): via INTRAVENOUS

## 2017-07-11 MED ORDER — ONDANSETRON HCL 4 MG PO TABS: 4.0000 mg | ORAL_TABLET | Freq: Four times a day (QID) | ORAL | Status: DC | PRN

## 2017-07-11 MED ORDER — METOPROLOL TARTRATE 50 MG PO TABS
50.0000 mg | ORAL_TABLET | Freq: Two times a day (BID) | ORAL | Status: DC
  Administered 2017-07-11 – 2017-07-20 (×20): 50 mg via ORAL
  Filled 2017-07-11 (×4): qty 1
  Filled 2017-07-11: qty 2
  Filled 2017-07-11 (×16): qty 1

## 2017-07-11 MED ORDER — ACETAMINOPHEN 650 MG RE SUPP: 650.0000 mg | Freq: Four times a day (QID) | RECTAL | Status: DC | PRN

## 2017-07-11 MED ORDER — ENSURE ENLIVE PO LIQD
237.0000 mL | Freq: Two times a day (BID) | ORAL | Status: DC
  Administered 2017-07-12 – 2017-07-20 (×14): 237 mL via ORAL

## 2017-07-11 MED ORDER — MOMETASONE FURO-FORMOTEROL FUM 200-5 MCG/ACT IN AERO
2.0000 | INHALATION_SPRAY | Freq: Two times a day (BID) | RESPIRATORY_TRACT | Status: DC
  Administered 2017-07-12 – 2017-07-20 (×16): 2 via RESPIRATORY_TRACT
  Filled 2017-07-11 (×2): qty 8.8

## 2017-07-11 MED ORDER — HEPARIN SODIUM (PORCINE) 5000 UNIT/ML IJ SOLN
5000.0000 [IU] | Freq: Three times a day (TID) | INTRAMUSCULAR | Status: DC
  Filled 2017-07-11 (×2): qty 1

## 2017-07-11 MED ORDER — ALBUTEROL SULFATE (2.5 MG/3ML) 0.083% IN NEBU: 2.5000 mg | INHALATION_SOLUTION | Freq: Four times a day (QID) | RESPIRATORY_TRACT | Status: DC | PRN

## 2017-07-11 MED ORDER — TRAMADOL HCL 50 MG PO TABS: 50.0000 mg | ORAL_TABLET | Freq: Four times a day (QID) | ORAL | Status: DC | PRN

## 2017-07-11 MED ORDER — PANTOPRAZOLE SODIUM 40 MG IV SOLR
40.0000 mg | Freq: Two times a day (BID) | INTRAVENOUS | Status: DC
  Administered 2017-07-11 – 2017-07-13 (×5): 40 mg via INTRAVENOUS
  Filled 2017-07-11 (×5): qty 40

## 2017-07-11 MED ORDER — ASPIRIN EC 81 MG PO TBEC: 81.0000 mg | DELAYED_RELEASE_TABLET | Freq: Every day | ORAL | Status: DC

## 2017-07-11 MED ORDER — ONDANSETRON HCL 4 MG/2ML IJ SOLN
4.0000 mg | Freq: Four times a day (QID) | INTRAMUSCULAR | Status: DC | PRN
  Administered 2017-07-19: 4 mg via INTRAVENOUS
  Filled 2017-07-11: qty 2

## 2017-07-11 MED ORDER — GABAPENTIN 100 MG PO CAPS
100.0000 mg | ORAL_CAPSULE | Freq: Every day | ORAL | Status: DC
  Administered 2017-07-11 – 2017-07-19 (×8): 100 mg via ORAL
  Filled 2017-07-11 (×9): qty 1

## 2017-07-11 MED ORDER — POTASSIUM CHLORIDE 10 MEQ/100ML IV SOLN
10.0000 meq | INTRAVENOUS | Status: AC
  Administered 2017-07-11 (×3): 10 meq via INTRAVENOUS
  Filled 2017-07-11 (×3): qty 100

## 2017-07-11 MED ORDER — TICAGRELOR 90 MG PO TABS
90.0000 mg | ORAL_TABLET | Freq: Two times a day (BID) | ORAL | Status: DC
  Filled 2017-07-11: qty 1

## 2017-07-11 MED ORDER — SODIUM CHLORIDE 0.9 % IV SOLN
1.0000 g | Freq: Once | INTRAVENOUS | Status: AC
  Administered 2017-07-11: 1 g via INTRAVENOUS
  Filled 2017-07-11: qty 10

## 2017-07-11 MED ORDER — SODIUM BICARBONATE 8.4 % IV SOLN
INTRAVENOUS | Status: DC
  Administered 2017-07-11 – 2017-07-14 (×7): via INTRAVENOUS
  Filled 2017-07-11 (×10): qty 150

## 2017-07-11 NOTE — ED Notes (Signed)
Report given to [id number].

## 2017-07-11 NOTE — Progress Notes (Signed)
Bilateral lower extremity venous duplex has been completed. Negative for obvious evidence of DVT.  07/11/17 8:58 AM Carlos Levering RVT

## 2017-07-11 NOTE — ED Notes (Signed)
Per report from night shift nurse. Patient will not open her eyes. Night shift nurse was not comfortable giving PO medications (Neurontin) and states MD made aware. At this time patient will nod her head yes she hears me and understands me but will not open her eyes or speak.

## 2017-07-11 NOTE — Progress Notes (Signed)
Patient arrived on unit from ED. Granddaughter at bedside.   Telemetry placed per MD order and CMT notified. Foley catheter placed per MD order.  Patient tolerated well.

## 2017-07-11 NOTE — Progress Notes (Signed)
PROGRESS NOTE    Sara Fox  VOJ:[id number] DOB: [birth year] DOA: 07/10/2017 PCP: [name] Chard, MD   Brief Narrative:  Sara Fox is a 77 y.o. female with medical history significant of HTN, HLD, systolic CHF last EF 99-37% with grade 1 dFx, CAD; who presents with weakness and confusion over the last 1 week.  Patient reports having diarrhea going multiple times per day with dark stools.  Last bowel movement noted yesterday night.  Family reported patient having intermittent confusion, with increased lethargy, and weakness.  Associated symptoms include complaints of right leg pain, lower abdominal pain, dysuria, and lower extremity swelling. Patient has been more sedimentary.  Last month she has suffered a left wrist fracture.  Denies any recent fever, shortness of breath, antibiotics, travel, or falls.  She was seen by her PCP today and blood work was obtained.  Patient was called back and advised to come in to the emergency department for further evaluation due to abnormal labs.  Upon admission into the emergency department patient was noted to be afebrile with vital signs relatively within normal limits. She was found to have Acute Renal Failure and also found to have Rhabdomyolysis.   Assessment & Plan:   Principal Problem:   ARF (acute renal failure) (HCC) Active Problems:   Normocytic anemia   Bilateral lower extremity edema   Rhabdomyolysis   Melena   Hypoalbuminemia   Elevated LFTs  Acute Kidney Injury/Acute Renal Failure -Acute.  Patient's baseline creatinine previously noted to be within normal limits, but she presented with a BUN of 43 and creatinine 3.82.  - Urinalysis was positive for large hemoglobin but negative for RBCs given concern for rhabdomyolysis.   -Given patient taking diuretics suspect prerenal cause of symptoms and lack of mobility . -Admitted to telemetry bed  -Strict intake and output -Check urine sodium, urea, and creatinine - Bolus 500 mL of normal  saline fluids then placed at a rate of 125 ml/hr as tolerated; Changed IVF to Isotonic Sodium Bicarbonate at a rate of 75 mL/hr -Checked Abdominal ultrasound and showed no acute findings but mildly increased echotexture in the kidnesy bilaterally suggesting early chronic medical renal disease -Hold nephrotoxic agents -BUN/Cr now 47/3.92 -Urinalysis had 100 Protein  -Discussed with Dr. Jimmy Footman in Nephrology; Low Threshold for Formal Consultation if Cr continues to Worsen further   Acute Rhabdomyolysis -Acute.  CK elevated at 40,213 on admission and now improved to 26,183 -IV fluids as seen above -Check daily CPK   Melena -Acute.  Patient reports having dark diarrhea over the last 1-2 weeks; No episodes here -Stool guaiacs were ordered and noted to be weakly positive.   -No reported recent antibiotic use.  Question possibility of upper GI bleed given elevated BUN. -Type and screen for possible need of blood products -Monitor H&H; Hb/Hct went from 9.7/30.0 -> 9.1/27.8 -C/w Protonix  40mg  IV BID -If Hb/Hct continues to Worsen will have GI Evaluate   Normocytic Normochromic Anemia -Hemoglobin noted to be 9.7 on admission. Baseline hemoglobin previously noted to be around 10-11. -Hb/Hct went from 9.7/30.0 -> 9.1/27.8 -Continue to Monitor for S/Sx of Bleeding -Repeat CBC in AM    Acute Encephalopathy, improving -Suspect secondary to dehydration. -Neurochecks -If continues to persist will get Head CT w/o Contrast  Hypokalemia -Acute.  Initial potassium noted to be 2.8 on admission and now improved to 3.2. -Patient has been taking furosemide at home given 10 mEq of potassium chloride IV in ED. -Give additional 40 mEq of potassium  chloride IV and replete again  -Checked Magnesium and was 2.3 - Continue to monitor and replace as needed  History of Systolic CHF -Last EF noted to be around 45-50% with grade 1 diastolic dysfunction in 8756. -Hold furosemide and lisinopril due to  acute renal failure -Continue Metoprolol as tolerated -Strict I's/O's, Daily Weights -Repeat ECHOCardiogram this visit  -Placed Foley for accurate Monitoring and patient is +1.23 Liters   Lower Extremity Edema -Patient with at least 2+ pitting lower extremity edema.  Symptoms likely could be secondary to hypoalbuminemia vs. Heart Failure  -Check Doppler duplex ultrasound and was Negative for DVT  CAD -Status post DES stent placement and 09/2014. -Currently Holding Brilinta and Aspirin  Abnormal/Elevated LFTs -Acute.   -Patient with significantly elevated AST and ALT which correlates with rhabdomyolysis;  -AST went from 1239 -> 992 and ALT went from 843 -> 740 -Levels should correct with IV fluids alone and now Trending down -Recheck CMP AM  Hypoalbuminemia -Acute.  Patient's initial albumin noted to be 2.7 on admission. -Checked prealbumin this AM and was 16.3 -Dietician/Nutritionist Consult appreciated  Hyperlipidemia  -Continue to Hold statin   DVT prophylaxis:  Code Status: FULL CODE Family Communication: No family present at bedside  Disposition Plan: Remain Inpateint for Continued Workup and Treatment  Consultants:   Discussed Case with Dr. Jimmy Footman of Nephrology via Telephone Conversation    Procedures:  Bilateral Lower Extremity Duplex Negative for obvious evidence of DVT.   Antimicrobials:  Anti-infectives (From admission, onward)   None     Subjective: Seen and examined at bedside and stated she was thirsty. No Pain but was unable to provide much a history. Felt slightly better.   Objective: Vitals:   07/11/17 1300 07/11/17 1330 07/11/17 1400 07/11/17 1430  BP: 120/64 (!) 147/67 117/61 (!) 122/59  Pulse: 74 72 73 77  Resp: 19 20 (!) 21 19  Temp:      TempSrc:      SpO2: 99% 97% 97% 97%    Intake/Output Summary (Last 24 hours) at 07/11/2017 1836 Last data filed at 07/11/2017 1811 Gross per 24 hour  Intake 1330 ml  Output 100 ml  Net 1230  ml   There were no vitals filed for this visit.  Examination: Physical Exam:  Constitutional: AAF in NAD and appears calm and comfortable Eyes: Lids and conjunctivae normal, sclerae anicteric  ENMT: External Ears, Nose appear normal. Slightly hard of hearing Neck: Appears normal, supple, no cervical masses, normal ROM, no appreciable thyromegaly, no JVD Respiratory: Diminished to auscultation bilaterally, no wheezing, rales, rhonchi or crackles. Normal respiratory effort and patient is not tachypenic. No accessory muscle use.  Cardiovascular: RRR, no murmurs / rubs / gallops. S1 and S2 auscultated. 2+ LE Pitting Edema. Abdomen: Soft, non-tender, non-distended. No masses palpated. No appreciable hepatosplenomegaly. Bowel sounds positive x4.  GU: Deferred. Musculoskeletal: No clubbing / cyanosis of digits/nails. No joint deformity upper and lower extremities. Skin: No rashes, lesions, ulcers on a limited skin evaluation. No induration; Warm and dry.  Neurologic: CN 2-12 grossly intact with no focal deficits. Romberg sign and cerebellar reflexes not assessed.  Psychiatric: Impaired judgment and insight. Somnolent but easily arousable. Normal mood and appropriate affect.   Data Reviewed: I have personally reviewed following labs and imaging studies  CBC: Recent Labs  Lab 07/10/17 1906 07/11/17 0527  WBC 9.0 9.9  NEUTROABS  --  8.0*  HGB 9.7* 9.1*  HCT 30.0* 27.8*  MCV 98.4 97.5  PLT 335 316  Basic Metabolic Panel: Recent Labs  Lab 07/10/17 1906 07/11/17 0527  NA 141 138  K 2.8* 3.2*  CL 107 109  CO2 19* 17*  GLUCOSE 112* 79  BUN 43* 47*  CREATININE 3.82* 3.92*  CALCIUM 8.3* 7.6*  MG 2.3  --    GFR: CrCl cannot be calculated (Unknown ideal weight.). Liver Function Tests: Recent Labs  Lab 07/10/17 2236 07/11/17 0527  AST 1,239* 992*  ALT 843* 740*  ALKPHOS 102 93  BILITOT 0.8 0.7  PROT 5.9* 5.1*  ALBUMIN 2.7* 2.3*   No results for input(s): LIPASE, AMYLASE in  the last 168 hours. No results for input(s): AMMONIA in the last 168 hours. Coagulation Profile: No results for input(s): INR, PROTIME in the last 168 hours. Cardiac Enzymes: Recent Labs  Lab 07/10/17 1906 07/11/17 0527  CKTOTAL 40,213* 26,183*   BNP (last 3 results) No results for input(s): PROBNP in the last 8760 hours. HbA1C: No results for input(s): HGBA1C in the last 72 hours. CBG: No results for input(s): GLUCAP in the last 168 hours. Lipid Profile: No results for input(s): CHOL, HDL, LDLCALC, TRIG, CHOLHDL, LDLDIRECT in the last 72 hours. Thyroid Function Tests: Recent Labs    07/10/17 1914  TSH 0.926   Anemia Panel: No results for input(s): VITAMINB12, FOLATE, FERRITIN, TIBC, IRON, RETICCTPCT in the last 72 hours. Sepsis Labs: No results for input(s): PROCALCITON, LATICACIDVEN in the last 168 hours.  No results found for this or any previous visit (from the past 240 hour(s)).   Radiology Studies: US Abdomen Complete  Result Date: 07/11/2017 CLINICAL DATA:  Elevated LFTs EXAM: ABDOMEN ULTRASOUND COMPLETE COMPARISON:  Ultrasound 08/25/2010 FINDINGS: Gallbladder: Prior cholecystectomy Common bile duct: Diameter: Dilated, 7-8 mm, likely related to patient's age and post cholecystectomy state. Liver: Heterogeneous echotexture. No focal abnormality. Portal vein is patent on color Doppler imaging with normal direction of blood flow towards the liver. IVC: No abnormality visualized. Pancreas: Visualized portion unremarkable. Spleen: Size and appearance within normal limits. Right Kidney: Length: 10.9 cm. Mildly increased echotexture. No mass or hydronephrosis. Left Kidney: Length: 10.7 cm. Mildly increased echotexture. No mass or hydronephrosis. Abdominal aorta: No aneurysm visualized. Other findings: None. IMPRESSION: No acute findings. Mildly increased echotexture in the kidneys bilaterally suggesting early chronic medical renal disease. Electronically Signed   By: Rolm Baptise  M.D.   On: 07/11/2017 01:53   Scheduled Meds: . [START ON 07/12/2017] feeding supplement (ENSURE ENLIVE)  237 mL Oral BID BM  . gabapentin  100 mg Oral QHS  . metoprolol tartrate  50 mg Oral BID WC  . mometasone-formoterol  2 puff Inhalation BID  . pantoprazole (PROTONIX) IV  40 mg Intravenous Q12H   Continuous Infusions: .  sodium bicarbonate  infusion 1000 mL 75 mL/hr at 07/11/17 1657    LOS: 0 days   Kerney Elbe, DO [facility] Pager [phone]  If 7PM-7AM, please contact night-coverage [email or web address] Password [facility] 07/11/2017, 6:36 PM

## 2017-07-11 NOTE — ED Notes (Signed)
ED TO INPATIENT HANDOFF REPORT  Name/Age/Gender Sara Fox 77 y.o. female  Code Status    Code Status Orders  (From admission, onward)        Start     Ordered   07/11/17 0000  Full code  Continuous     07/11/17 0002    Code Status History    Date Active Date Inactive Code Status Order ID Comments User Context   09/14/2014 13:37 09/15/2014 15:46 Full Code [id number]  Leonie Man, MD Inpatient   09/12/2014 08:49 09/14/2014 13:37 Full Code [id number]  [name], NP Inpatient    Advance Directive Documentation     Most Recent Value  Type of Advance Directive  Living will  Pre-existing out of facility DNR order (yellow form or pink MOST form)  No data  "MOST" Form in Place?  No data      Home/SNF/Other Home  Chief Complaint dehydrated  Level of Care/Admitting Diagnosis ED Disposition    ED Disposition Condition Comment   Admit  Hospital Area: [facility] [[id number]]  Level of Care: Telemetry [5]  Admit to tele based on following criteria: Complex arrhythmia (Bradycardia/Tachycardia)  Diagnosis: ARF (acute renal failure) [facility]) [[id number]]  Admitting Physician: Norval Morton [[id number]]  Attending Physician: JANAYA, BROY [[id number]]  Estimated length of stay: 3 - 4 days  Certification:: I certify this patient will need inpatient services for at least 2 midnights  PT Class (Do Not Modify): Inpatient [101]  PT Acc Code (Do Not Modify): Private [1]       Medical History Past Medical History:  Diagnosis Date  . Back pain   . Cervical cancer ([place])   . Hyperlipidemia   . Hypertension   . Neck pain   . Stomach problems     Allergies Allergies  Allergen Reactions  . Buprenorphine Hcl Shortness Of Breath    Throat swelling/trouble breathing and lethargic  . Celebrex [Celecoxib] Diarrhea and Swelling    Swelling of legs   . Morphine And Related Shortness Of Breath    Throat swelling/trouble breathing and lethargic  . Codeine Other (See  Comments)    Bloated   . Vioxx [Rofecoxib] Palpitations    IV Location/Drains/Wounds Patient Lines/Drains/Airways Status   Active Line/Drains/Airways    Name:   Placement date:   Placement time:   Site:   Days:   Peripheral IV 07/10/17 Left Antecubital   07/10/17    2244    Antecubital   1          Labs/Imaging Results for orders placed or performed during the hospital encounter of 07/10/17 (from the past 48 hour(s))  Basic metabolic panel     Status: Abnormal   Collection Time: 07/10/17  7:06 PM  Result Value Ref Range   Sodium 141 135 - 145 mmol/L   Potassium 2.8 (L) 3.5 - 5.1 mmol/L   Chloride 107 101 - 111 mmol/L   CO2 19 (L) 22 - 32 mmol/L   Glucose, Bld 112 (H) 65 - 99 mg/dL   BUN 43 (H) 6 - 20 mg/dL   Creatinine, Ser 3.82 (H) 0.44 - 1.00 mg/dL   Calcium 8.3 (L) 8.9 - 10.3 mg/dL   GFR calc non Af Amer 10 (L) >60 mL/min   GFR calc Af Amer 12 (L) >60 mL/min    Comment: (NOTE) The eGFR has been calculated using the CKD EPI equation. This calculation has not been validated in all clinical situations. eGFR's persistently <60  mL/min signify possible Chronic Kidney Disease.    Anion gap 15 5 - 15    Comment: Performed at [facility], [place] [address] [place], [place] [address]  CBC     Status: Abnormal   Collection Time: 07/10/17  7:06 PM  Result Value Ref Range   WBC 9.0 4.0 - 10.5 K/uL   RBC 3.05 (L) 3.87 - 5.11 MIL/uL   Hemoglobin 9.7 (L) 12.0 - 15.0 g/dL   HCT 30.0 (L) 36.0 - 46.0 %   MCV 98.4 78.0 - 100.0 fL   MCH 31.8 26.0 - 34.0 pg   MCHC 32.3 30.0 - 36.0 g/dL   RDW 15.1 11.5 - 15.5 %   Platelets 335 150 - 400 K/uL    Comment: Performed at [facility] [place] [address] [place], [place] [address]  CK     Status: Abnormal   Collection Time: 07/10/17  7:06 PM  Result Value Ref Range   Total CK 40,213 (H) 38 - 234 U/L    Comment: RESULTS CONFIRMED BY MANUAL DILUTION Performed at [facility], [place]  [address], [place], [place] [address]   Magnesium     Status: None   Collection Time: 07/10/17  7:06 PM  Result Value Ref Range   Magnesium 2.3 1.7 - 2.4 mg/dL    Comment: Performed at [facility], [place] [address], [place], [place] [address]  TSH     Status: None   Collection Time: 07/10/17  7:14 PM  Result Value Ref Range   TSH 0.926 0.350 - 4.500 uIU/mL    Comment: Performed by a 3rd Generation assay with a functional sensitivity of <=0.01 uIU/mL. Performed at [facility], [place] [address] [place], [place] [address]   Hepatic function panel     Status: Abnormal   Collection Time: 07/10/17 10:36 PM  Result Value Ref Range   Total Protein 5.9 (L) 6.5 - 8.1 g/dL   Albumin 2.7 (L) 3.5 - 5.0 g/dL   AST 1,239 (H) 15 - 41 U/L   ALT 843 (H) 14 - 54 U/L   Alkaline Phosphatase 102 38 - 126 U/L   Total Bilirubin 0.8 0.3 - 1.2 mg/dL   Bilirubin, Direct 0.1 0.1 - 0.5 mg/dL   Indirect Bilirubin 0.7 0.3 - 0.9 mg/dL    Comment: Performed at [facility], [place] [address] [place],  [address]  Urinalysis, Routine w reflex microscopic     Status: Abnormal   Collection Time: 07/10/17 11:02 PM  Result Value Ref Range   Color, Urine AMBER (A) YELLOW    Comment: BIOCHEMICALS MAY BE AFFECTED BY COLOR   APPearance HAZY (A) CLEAR   Specific Gravity, Urine 1.015 1.005 - 1.030   pH 5.0 5.0 - 8.0   Glucose, UA NEGATIVE NEGATIVE mg/dL   Hgb urine dipstick LARGE (A) NEGATIVE   Bilirubin Urine NEGATIVE NEGATIVE   Ketones, ur NEGATIVE NEGATIVE mg/dL   Protein, ur 100 (A) NEGATIVE mg/dL   Nitrite NEGATIVE NEGATIVE   Leukocytes, UA NEGATIVE NEGATIVE   RBC / HPF 0-5 0 - 5 RBC/hpf   WBC, UA 0-5 0 - 5 WBC/hpf   Bacteria, UA RARE (A) NONE SEEN   Squamous Epithelial / LPF 0-5 (A) NONE SEEN   Amorphous Crystal PRESENT     Comment: Performed at [facility], [place] [address] [place], [place] [address]  Sodium, urine, random      Status: None  Collection Time: 07/10/17 11:09 PM  Result Value Ref Range   Sodium, Ur 35 mmol/L    Comment: Performed at [place] [address], [place], [place] [address]  Creatinine, urine, random     Status: None   Collection Time: 07/10/17 11:09 PM  Result Value Ref Range   Creatinine, Urine 84.01 mg/dL    Comment: Performed at [place] [address] [place], [place] [address]  POC occult blood, ED     Status: Abnormal   Collection Time: 07/11/17 12:50 AM  Result Value Ref Range   Fecal Occult Bld POSITIVE (A) NEGATIVE  Type and screen [place]     Status: None   Collection Time: 07/11/17  1:12 AM  Result Value Ref Range   ABO/RH(D) O POS    Antibody Screen NEG    Sample Expiration      07/14/2017 Performed at [facility], [place] [address], [place], [place] [address]   ABO/Rh     Status: None   Collection Time: 07/11/17  1:12 AM  Result Value Ref Range   ABO/RH(D)      O POS Performed at [facility], [place] [address] [place], [place] [address]   Prealbumin     Status: Abnormal   Collection Time: 07/11/17  5:27 AM  Result Value Ref Range   Prealbumin 16.3 (L) 18 - 38 mg/dL    Comment: Performed at [place] [address], [place], [place] [address]  CBC with Differential/Platelet     Status: Abnormal   Collection Time: 07/11/17  5:27 AM  Result Value Ref Range   WBC 9.9 4.0 - 10.5 K/uL   RBC 2.85 (L) 3.87 - 5.11 MIL/uL   Hemoglobin 9.1 (L) 12.0 - 15.0 g/dL   HCT 27.8 (L) 36.0 - 46.0 %   MCV 97.5 78.0 - 100.0 fL   MCH 31.9 26.0 - 34.0 pg   MCHC 32.7 30.0 - 36.0 g/dL   RDW 14.9 11.5 - 15.5 %   Platelets 316 150 - 400 K/uL   Neutrophils Relative % 82 %   Neutro Abs 8.0 (H) 1.7 - 7.7 K/uL   Lymphocytes Relative 7 %   Lymphs Abs 0.7 0.7 - 4.0 K/uL   Monocytes Relative 11 %   Monocytes Absolute 1.1 (H) 0.1 - 1.0 K/uL   Eosinophils Relative 0 %   Eosinophils Absolute 0.0  0.0 - 0.7 K/uL   Basophils Relative 0 %   Basophils Absolute 0.0 0.0 - 0.1 K/uL    Comment: Performed at [facility], [place] [address], [place], [place] [address]  Comprehensive metabolic panel     Status: Abnormal   Collection Time: 07/11/17  5:27 AM  Result Value Ref Range   Sodium 138 135 - 145 mmol/L   Potassium 3.2 (L) 3.5 - 5.1 mmol/L   Chloride 109 101 - 111 mmol/L   CO2 17 (L) 22 - 32 mmol/L   Glucose, Bld 79 65 - 99 mg/dL   BUN 47 (H) 6 - 20 mg/dL   Creatinine, Ser 3.92 (H) 0.44 - 1.00 mg/dL   Calcium 7.6 (L) 8.9 - 10.3 mg/dL   Total Protein 5.1 (L) 6.5 - 8.1 g/dL   Albumin 2.3 (L) 3.5 - 5.0 g/dL   AST 992 (H) 15 - 41 U/L   ALT 740 (H) 14 - 54 U/L   Alkaline Phosphatase 93 38 - 126 U/L  Total Bilirubin 0.7 0.3 - 1.2 mg/dL   GFR calc non Af Amer 10 (L) >60 mL/min   GFR calc Af Amer 12 (L) >60 mL/min    Comment: (NOTE) The eGFR has been calculated using the CKD EPI equation. This calculation has not been validated in all clinical situations. eGFR's persistently <60 mL/min signify possible Chronic Kidney Disease.    Anion gap 12 5 - 15    Comment: Performed at [facility], [place] [address], [place], [place] [address]  CK     Status: Abnormal   Collection Time: 07/11/17  5:27 AM  Result Value Ref Range   Total CK 26,183 (H) 38 - 234 U/L    Comment: RESULTS CONFIRMED BY MANUAL DILUTION Performed at [facility], [place] [address], [place], [place] [address]    US Abdomen Complete  Result Date: 07/11/2017 CLINICAL DATA:  Elevated LFTs EXAM: ABDOMEN ULTRASOUND COMPLETE COMPARISON:  Ultrasound 08/25/2010 FINDINGS: Gallbladder: Prior cholecystectomy Common bile duct: Diameter: Dilated, 7-8 mm, likely related to patient's age and post cholecystectomy state. Liver: Heterogeneous echotexture. No focal abnormality. Portal vein is patent on color Doppler imaging with normal direction of blood flow towards the liver. IVC: No  abnormality visualized. Pancreas: Visualized portion unremarkable. Spleen: Size and appearance within normal limits. Right Kidney: Length: 10.9 cm. Mildly increased echotexture. No mass or hydronephrosis. Left Kidney: Length: 10.7 cm. Mildly increased echotexture. No mass or hydronephrosis. Abdominal aorta: No aneurysm visualized. Other findings: None. IMPRESSION: No acute findings. Mildly increased echotexture in the kidneys bilaterally suggesting early chronic medical renal disease. Electronically Signed   By: Rolm Baptise M.D.   On: 07/11/2017 01:53    Pending Labs Unresulted Labs (From admission, onward)   Start     Ordered   07/11/17 0500  CK  Daily,   R     07/11/17 0122   07/11/17 0014  Occult blood card to lab, stool RN will collect  Once,   R    Question:  Specimen to be collected by?  Answer:  RN will collect   07/11/17 0013   07/11/17 0002  Urea nitrogen, urine  Once,   R     07/11/17 0002      Vitals/Pain Today's Vitals   07/11/17 1300 07/11/17 1330 07/11/17 1400 07/11/17 1430  BP: 120/64 (!) 147/67 117/61 (!) 122/59  Pulse: 74 72 73 77  Resp: 19 20 (!) 21 19  Temp:      TempSrc:      SpO2: 99% 97% 97% 97%  PainSc:        Isolation Precautions No active isolations  Medications Medications  mometasone-formoterol (DULERA) 200-5 MCG/ACT inhaler 2 puff (2 puffs Inhalation Not Given 07/11/17 0806)  metoprolol tartrate (LOPRESSOR) tablet 50 mg (50 mg Oral Given 07/11/17 1048)  traMADol (ULTRAM) tablet 50-100 mg (not administered)  gabapentin (NEURONTIN) capsule 100 mg (100 mg Oral Not Given 07/11/17 0721)  acetaminophen (TYLENOL) tablet 650 mg (not administered)    Or  acetaminophen (TYLENOL) suppository 650 mg (not administered)  ondansetron (ZOFRAN) tablet 4 mg (not administered)    Or  ondansetron (ZOFRAN) injection 4 mg (not administered)  albuterol (PROVENTIL) (2.5 MG/3ML) 0.083% nebulizer solution 2.5 mg (not administered)  0.9 %  sodium chloride infusion (  Intravenous New Bag/Given 07/11/17 1150)  potassium chloride 10 mEq in 100 mL IVPB (10 mEq Intravenous Not Given 07/11/17 0721)  pantoprazole (PROTONIX) injection 40 mg (40 mg Intravenous Given 07/11/17 1049)  sodium chloride 0.9 %  bolus 1,000 mL (0 mLs Intravenous Stopped 07/10/17 2320)  potassium chloride 10 mEq in 100 mL IVPB (0 mEq Intravenous Stopped 07/11/17 0105)  sodium chloride 0.9 % bolus 500 mL (0 mLs Intravenous Stopped 07/11/17 0045)  sodium chloride 0.9 % bolus 500 mL (0 mLs Intravenous Stopped 07/11/17 0200)  calcium gluconate 1 g in sodium chloride 0.9 % 100 mL IVPB (0 g Intravenous Stopped 07/11/17 0806)    Mobility walks with person assist

## 2017-07-12 ENCOUNTER — Encounter (HOSPITAL_COMMUNITY): Payer: Self-pay | Admitting: Gastroenterology

## 2017-07-12 ENCOUNTER — Inpatient Hospital Stay (HOSPITAL_COMMUNITY): Payer: Medicare Other

## 2017-07-12 DIAGNOSIS — E440 Moderate protein-calorie malnutrition: Secondary | ICD-10-CM

## 2017-07-12 DIAGNOSIS — I517 Cardiomegaly: Secondary | ICD-10-CM

## 2017-07-12 LAB — CBC WITH DIFFERENTIAL/PLATELET
Basophils Absolute: 0 10*3/uL (ref 0.0–0.1)
Basophils Relative: 0 %
Eosinophils Absolute: 0 10*3/uL (ref 0.0–0.7)
Eosinophils Relative: 0 %
HCT: 28 % — ABNORMAL LOW (ref 36.0–46.0)
Hemoglobin: 9.3 g/dL — ABNORMAL LOW (ref 12.0–15.0)
Lymphocytes Relative: 9 %
Lymphs Abs: 0.9 10*3/uL (ref 0.7–4.0)
MCH: 32 pg (ref 26.0–34.0)
MCHC: 33.2 g/dL (ref 30.0–36.0)
MCV: 96.2 fL (ref 78.0–100.0)
Monocytes Absolute: 0.7 10*3/uL (ref 0.1–1.0)
Monocytes Relative: 7 %
Neutro Abs: 8 10*3/uL — ABNORMAL HIGH (ref 1.7–7.7)
Neutrophils Relative %: 84 %
Platelets: 310 10*3/uL (ref 150–400)
RBC: 2.91 MIL/uL — ABNORMAL LOW (ref 3.87–5.11)
RDW: 14.9 % (ref 11.5–15.5)
WBC: 9.5 10*3/uL (ref 4.0–10.5)

## 2017-07-12 LAB — ECHOCARDIOGRAM COMPLETE
Height: 64 in
Weight: 2416 oz

## 2017-07-12 LAB — PHOSPHORUS: Phosphorus: 5.2 mg/dL — ABNORMAL HIGH (ref 2.5–4.6)

## 2017-07-12 LAB — COMPREHENSIVE METABOLIC PANEL
ALT: 592 U/L — ABNORMAL HIGH (ref 14–54)
AST: 622 U/L — ABNORMAL HIGH (ref 15–41)
Albumin: 2.1 g/dL — ABNORMAL LOW (ref 3.5–5.0)
Alkaline Phosphatase: 93 U/L (ref 38–126)
Anion gap: 11 (ref 5–15)
BUN: 56 mg/dL — ABNORMAL HIGH (ref 6–20)
CO2: 20 mmol/L — ABNORMAL LOW (ref 22–32)
Calcium: 8.1 mg/dL — ABNORMAL LOW (ref 8.9–10.3)
Chloride: 110 mmol/L (ref 101–111)
Creatinine, Ser: 4.51 mg/dL — ABNORMAL HIGH (ref 0.44–1.00)
GFR calc Af Amer: 10 mL/min — ABNORMAL LOW (ref >60)
GFR calc non Af Amer: 9 mL/min — ABNORMAL LOW (ref >60)
Glucose, Bld: 111 mg/dL — ABNORMAL HIGH (ref 65–99)
Potassium: 3.2 mmol/L — ABNORMAL LOW (ref 3.5–5.1)
Sodium: 141 mmol/L (ref 135–145)
Total Bilirubin: 0.5 mg/dL (ref 0.3–1.2)
Total Protein: 4.9 g/dL — ABNORMAL LOW (ref 6.5–8.1)

## 2017-07-12 LAB — MAGNESIUM: Magnesium: 2.1 mg/dL (ref 1.7–2.4)

## 2017-07-12 LAB — UREA NITROGEN, URINE: Urea Nitrogen, Ur: 414 mg/dL

## 2017-07-12 LAB — CK: Total CK: 18056 U/L — ABNORMAL HIGH (ref 38–234)

## 2017-07-12 MED ORDER — POTASSIUM CHLORIDE CRYS ER 10 MEQ PO TBCR
10.0000 meq | EXTENDED_RELEASE_TABLET | Freq: Two times a day (BID) | ORAL | Status: AC
  Administered 2017-07-12 (×2): 10 meq via ORAL
  Filled 2017-07-12 (×2): qty 1

## 2017-07-12 MED ORDER — NIFEDIPINE ER OSMOTIC RELEASE 30 MG PO TB24: 30.0000 mg | ORAL_TABLET | Freq: Every day | ORAL | Status: DC

## 2017-07-12 NOTE — Progress Notes (Addendum)
Initial Nutrition Assessment  DOCUMENTATION CODES:   Non-severe (moderate) malnutrition in context of chronic illness  INTERVENTION:   Ensure Enlive po BID, each supplement provides 350 kcal and 20 grams of protein  NUTRITION DIAGNOSIS:   Moderate Malnutrition related to chronic illness(CHF) as evidenced by energy intake < 75% for > or equal to 1 month, mild fat depletion, moderate muscle depletion, edema.  GOAL:   Patient will meet greater than or equal to 90% of their needs   MONITOR:   PO intake, Supplement acceptance, Weight trends, Labs  REASON FOR ASSESSMENT:   Consult Assessment of nutrition requirement/status  ASSESSMENT:   Pt with PMH significant for HLD, HTN, CHF, and CAD. Presents this admission with complaints of weakness and dark stools x 1 week. Pt admitted for AKI, ARF, and acute rhabdomyolysis.    Pt somewhat lethargic upon assessment. When asked about her appetite PTA she states "I ate whatever I want." When probed a little further, pt admits to eating one meal per day that consist of softer foods like grits and soup. Pt ordered grits, scrambled egg, potatoes, and a bagel for breakfast, consumed 50%. Spoke with nursing student who reports pt drank 100% of her Ensure after breakfast. With poor intake, will continue with the high calorie high protein Ensure, but monitor electrolytes closely.   Pt endorses a UBW of 147 lb, unsure of the last time she was a that weight. Records indicate pt weighed 147 lb 12/2016 and 151 lb this admission. With given dietary recall suspect pt has lost weight. A Nutrition-Focused physical exam was completed and pt was found to have moderate bilateral lower extremity swelling possibly masking wt loss/muscle loss.   Medications reviewed and include: sodium bicarbonate 150 mEq Labs reviewed: K 3.2 (L) BUN 56 (H) Creatinine 4.51 (H) Phosphorus 5.2 (H) PAB 16.3 (L)  NUTRITION - FOCUSED PHYSICAL EXAM:    Most Recent Value  Orbital Region   No depletion  Upper Arm Region  No depletion  Thoracic and Lumbar Region  Unable to assess  Buccal Region  Mild depletion  Temple Region  Moderate depletion  Clavicle Bone Region  Moderate depletion  Clavicle and Acromion Bone Region  Moderate depletion  Scapular Bone Region  Unable to assess  Dorsal Hand  No depletion  Patellar Region  No depletion  Anterior Thigh Region  No depletion  Posterior Calf Region  No depletion  Edema (RD Assessment)  Moderate  Hair  Reviewed  Eyes  Reviewed  Mouth  Reviewed  Skin  Reviewed  Nails  Reviewed     Diet Order:  Diet Heart Room service appropriate? Yes; Fluid consistency: Thin  EDUCATION NEEDS:   Not appropriate for education at this time  Skin:  Skin Assessment: Reviewed RN Assessment  Last BM:  07/10/17  Height:   Ht Readings from Last 1 Encounters:  07/12/17 5\' 4"  (1.626 m)    Weight:   Wt Readings from Last 1 Encounters:  07/12/17 151 lb (68.5 kg)    Ideal Body Weight:  54.5 kg  BMI:  Body mass index is 25.92 kg/m.  Estimated Nutritional Needs:   Kcal:  1700-1900 kcal/day  Protein:  85-95 g/day  Fluid:  >1.7 L/day    Mariana Single RD, LDN Clinical Nutrition Pager # - [phone]

## 2017-07-12 NOTE — Consult Note (Addendum)
Reason for Consult: Guaiac positive anemia Referring Physician: Hospital team                                                                                            Sara Fox is an 77 y.o. female.  HPI: Patient seen and examined and case discussed with the hospital team and her hospital computer chart reviewed including her previous workup in 2012 by different gastroenterologist and she currently has no GI complaints and her family history is negative from a GI standpoint but she has been on lots of nonsteroidals and was found to be guaiac positive and she does have chronic anemia but her hemoglobin has been stable and she has no other complaints  Past Medical History:  Diagnosis Date  . Back pain   . Cervical cancer ([place])   . Hyperlipidemia   . Hypertension   . Neck pain   . Stomach problems     Past Surgical History:  Procedure Laterality Date  . ABDOMINAL HYSTERECTOMY    . BACK SURGERY  2012   lower back  . CARDIAC CATHETERIZATION  06/1999   noncritical disease invovling PDA  . CARDIAC CATHETERIZATION N/A 09/14/2014   Procedure: Left Heart Cath and Coronary Angiography;  Surgeon: Leonie Man, MD;  Location: [facility] INVASIVE CV LAB CUPID;  Service: Cardiovascular;  Laterality: N/A;  . CHOLECYSTECTOMY    . KNEE SURGERY Bilateral 2001 & 2007  . NECK SURGERY  2012   2012  . PARTIAL GASTRECTOMY  2005   subtotal  . PERCUTANEOUS CORONARY STENT INTERVENTION (PCI-S)  09/14/2014   Procedure: Percutaneous Coronary Stent Intervention (Pci-S);  Surgeon: Leonie Man, MD;  Location: [facility] INVASIVE CV LAB CUPID;  Service: Cardiovascular;;  . SHOULDER SURGERY Right   . TRANSTHORACIC ECHOCARDIOGRAM  06/02/2010   EF=>55%, normal LV systolic function; normal RV systolic function; mild mitral annular calcif; trace TR; AV mildly sclerotic    Family History  Problem Relation Age of Onset  . Heart disease Mother        also HTN  . Diabetes Mother   . Colon cancer Mother   . Heart disease  Brother        deceased at 39  . Hypertension Brother   . Heart attack Brother   . Heart disease Brother   . Hypertension Brother     Social History:  reports that she has been smoking.  She has a 60.00 pack-year smoking history. she has never used smokeless tobacco. She reports that she does not drink alcohol or use drugs.  Allergies:  Allergies  Allergen Reactions  . Buprenorphine Hcl Shortness Of Breath    Throat swelling/trouble breathing and lethargic  . Celebrex [Celecoxib] Diarrhea and Swelling    Swelling of legs   . Morphine And Related Shortness Of Breath    Throat swelling/trouble breathing and lethargic  . Codeine Other (See Comments)    Bloated   . Vioxx [Rofecoxib] Palpitations    Medications: I have reviewed the patient's current medications.  Results for orders placed or performed during the hospital encounter of 07/10/17 (from the past 48 hour(s))  Basic metabolic panel     Status: Abnormal   Collection Time: 07/10/17  7:06 PM  Result Value Ref Range   Sodium 141 135 - 145 mmol/L   Potassium 2.8 (L) 3.5 - 5.1 mmol/L   Chloride 107 101 - 111 mmol/L   CO2 19 (L) 22 - 32 mmol/L   Glucose, Bld 112 (H) 65 - 99 mg/dL   BUN 43 (H) 6 - 20 mg/dL   Creatinine, Ser 3.82 (H) 0.44 - 1.00 mg/dL   Calcium 8.3 (L) 8.9 - 10.3 mg/dL   GFR calc non Af Amer 10 (L) >60 mL/min   GFR calc Af Amer 12 (L) >60 mL/min    Comment: (NOTE) The eGFR has been calculated using the CKD EPI equation. This calculation has not been validated in all clinical situations. eGFR's persistently <60 mL/min signify possible Chronic Kidney Disease.    Anion gap 15 5 - 15    Comment: Performed at [facility], [place] [address], [place], [place] [address]  CBC     Status: Abnormal   Collection Time: 07/10/17  7:06 PM  Result Value Ref Range   WBC 9.0 4.0 - 10.5 K/uL   RBC 3.05 (L) 3.87 - 5.11 MIL/uL   Hemoglobin 9.7 (L) 12.0 - 15.0 g/dL   HCT 30.0 (L) 36.0 - 46.0 %   MCV 98.4  78.0 - 100.0 fL   MCH 31.8 26.0 - 34.0 pg   MCHC 32.3 30.0 - 36.0 g/dL   RDW 15.1 11.5 - 15.5 %   Platelets 335 150 - 400 K/uL    Comment: Performed at [facility], [place] [address], [place], [place] [address]  CK     Status: Abnormal   Collection Time: 07/10/17  7:06 PM  Result Value Ref Range   Total CK 40,213 (H) 38 - 234 U/L    Comment: RESULTS CONFIRMED BY MANUAL DILUTION Performed at [facility], [place] [address], [place], [place] [address]   Magnesium     Status: None   Collection Time: 07/10/17  7:06 PM  Result Value Ref Range   Magnesium 2.3 1.7 - 2.4 mg/dL    Comment: Performed at [facility], [place] [address], [place], [place] [address]  TSH     Status: None   Collection Time: 07/10/17  7:14 PM  Result Value Ref Range   TSH 0.926 0.350 - 4.500 uIU/mL    Comment: Performed by a 3rd Generation assay with a functional sensitivity of <=0.01 uIU/mL. Performed at [facility], [place] [address], [place], [place] [address]   Hepatic function panel     Status: Abnormal   Collection Time: 07/10/17 10:36 PM  Result Value Ref Range   Total Protein 5.9 (L) 6.5 - 8.1 g/dL   Albumin 2.7 (L) 3.5 - 5.0 g/dL   AST 1,239 (H) 15 - 41 U/L   ALT 843 (H) 14 - 54 U/L   Alkaline Phosphatase 102 38 - 126 U/L   Total Bilirubin 0.8 0.3 - 1.2 mg/dL   Bilirubin, Direct 0.1 0.1 - 0.5 mg/dL   Indirect Bilirubin 0.7 0.3 - 0.9 mg/dL    Comment: Performed at [facility], [place] [address], [place],  [address]  Urinalysis, Routine w reflex microscopic     Status: Abnormal   Collection Time: 07/10/17 11:02 PM  Result Value Ref Range   Color, Urine AMBER (A) YELLOW    Comment: BIOCHEMICALS MAY BE AFFECTED  BY COLOR   APPearance HAZY (A) CLEAR   Specific Gravity, Urine 1.015 1.005 - 1.030   pH 5.0 5.0 - 8.0   Glucose, UA NEGATIVE NEGATIVE mg/dL   Hgb urine dipstick LARGE (A) NEGATIVE   Bilirubin Urine  NEGATIVE NEGATIVE   Ketones, ur NEGATIVE NEGATIVE mg/dL   Protein, ur 100 (A) NEGATIVE mg/dL   Nitrite NEGATIVE NEGATIVE   Leukocytes, UA NEGATIVE NEGATIVE   RBC / HPF 0-5 0 - 5 RBC/hpf   WBC, UA 0-5 0 - 5 WBC/hpf   Bacteria, UA RARE (A) NONE SEEN   Squamous Epithelial / LPF 0-5 (A) NONE SEEN   Amorphous Crystal PRESENT     Comment: Performed at [facility], [place] [address], [place], [place] [address]  Sodium, urine, random     Status: None   Collection Time: 07/10/17 11:09 PM  Result Value Ref Range   Sodium, Ur 35 mmol/L    Comment: Performed at [place] [address] [place], [place] [address]  Creatinine, urine, random     Status: None   Collection Time: 07/10/17 11:09 PM  Result Value Ref Range   Creatinine, Urine 84.01 mg/dL    Comment: Performed at [place] [address], [place], [place] [address]  Urea nitrogen, urine     Status: None   Collection Time: 07/10/17 11:09 PM  Result Value Ref Range   Urea Nitrogen, Ur 414 Not Estab. mg/dL    Comment: (NOTE) Performed At: [facility] [place], [place] [id number] Rush Farmer MD [id number] Performed at [facility], [place] [address], [place], [place] [address]   POC occult blood, ED     Status: Abnormal   Collection Time: 07/11/17 12:50 AM  Result Value Ref Range   Fecal Occult Bld POSITIVE (A) NEGATIVE  Type and screen [place]     Status: None   Collection Time: 07/11/17  1:12 AM  Result Value Ref Range   ABO/RH(D) O POS    Antibody Screen NEG    Sample Expiration      07/14/2017 Performed at [facility], [place] [address], [place], [place] [address]   ABO/Rh     Status: None   Collection Time: 07/11/17  1:12 AM  Result Value Ref Range   ABO/RH(D)      O POS Performed at [facility], [place] [address], [place], [place] [address]   Prealbumin     Status: Abnormal    Collection Time: 07/11/17  5:27 AM  Result Value Ref Range   Prealbumin 16.3 (L) 18 - 38 mg/dL    Comment: Performed at [place] [address], [place], [place] [address]  CBC with Differential/Platelet     Status: Abnormal   Collection Time: 07/11/17  5:27 AM  Result Value Ref Range   WBC 9.9 4.0 - 10.5 K/uL   RBC 2.85 (L) 3.87 - 5.11 MIL/uL   Hemoglobin 9.1 (L) 12.0 - 15.0 g/dL   HCT 27.8 (L) 36.0 - 46.0 %   MCV 97.5 78.0 - 100.0 fL   MCH 31.9 26.0 - 34.0 pg   MCHC 32.7 30.0 - 36.0 g/dL   RDW 14.9 11.5 - 15.5 %   Platelets 316 150 - 400 K/uL   Neutrophils Relative % 82 %   Neutro Abs 8.0 (H) 1.7 - 7.7 K/uL   Lymphocytes Relative 7 %   Lymphs Abs 0.7  0.7 - 4.0 K/uL   Monocytes Relative 11 %   Monocytes Absolute 1.1 (H) 0.1 - 1.0 K/uL   Eosinophils Relative 0 %   Eosinophils Absolute 0.0 0.0 - 0.7 K/uL   Basophils Relative 0 %   Basophils Absolute 0.0 0.0 - 0.1 K/uL    Comment: Performed at [facility], [place] [address], [place], [place] [address]  Comprehensive metabolic panel     Status: Abnormal   Collection Time: 07/11/17  5:27 AM  Result Value Ref Range   Sodium 138 135 - 145 mmol/L   Potassium 3.2 (L) 3.5 - 5.1 mmol/L   Chloride 109 101 - 111 mmol/L   CO2 17 (L) 22 - 32 mmol/L   Glucose, Bld 79 65 - 99 mg/dL   BUN 47 (H) 6 - 20 mg/dL   Creatinine, Ser 3.92 (H) 0.44 - 1.00 mg/dL   Calcium 7.6 (L) 8.9 - 10.3 mg/dL   Total Protein 5.1 (L) 6.5 - 8.1 g/dL   Albumin 2.3 (L) 3.5 - 5.0 g/dL   AST 992 (H) 15 - 41 U/L   ALT 740 (H) 14 - 54 U/L   Alkaline Phosphatase 93 38 - 126 U/L   Total Bilirubin 0.7 0.3 - 1.2 mg/dL   GFR calc non Af Amer 10 (L) >60 mL/min   GFR calc Af Amer 12 (L) >60 mL/min    Comment: (NOTE) The eGFR has been calculated using the CKD EPI equation. This calculation has not been validated in all clinical situations. eGFR's persistently <60 mL/min signify possible Chronic Kidney Disease.    Anion gap 12 5 - 15     Comment: Performed at [facility], [place] [address] [place], [place] [address]  CK     Status: Abnormal   Collection Time: 07/11/17  5:27 AM  Result Value Ref Range   Total CK 26,183 (H) 38 - 234 U/L    Comment: RESULTS CONFIRMED BY MANUAL DILUTION Performed at [facility], [place] [address], [place], [place] [address]   CK     Status: Abnormal   Collection Time: 07/12/17  5:28 AM  Result Value Ref Range   Total CK 18,056 (H) 38 - 234 U/L    Comment: RESULTS CONFIRMED BY MANUAL DILUTION Performed at [facility], [place] [address], [place], [place] [address]   CBC with Differential/Platelet     Status: Abnormal   Collection Time: 07/12/17  5:28 AM  Result Value Ref Range   WBC 9.5 4.0 - 10.5 K/uL   RBC 2.91 (L) 3.87 - 5.11 MIL/uL   Hemoglobin 9.3 (L) 12.0 - 15.0 g/dL   HCT 28.0 (L) 36.0 - 46.0 %   MCV 96.2 78.0 - 100.0 fL   MCH 32.0 26.0 - 34.0 pg   MCHC 33.2 30.0 - 36.0 g/dL   RDW 14.9 11.5 - 15.5 %   Platelets 310 150 - 400 K/uL   Neutrophils Relative % 84 %   Neutro Abs 8.0 (H) 1.7 - 7.7 K/uL   Lymphocytes Relative 9 %   Lymphs Abs 0.9 0.7 - 4.0 K/uL   Monocytes Relative 7 %   Monocytes Absolute 0.7 0.1 - 1.0 K/uL   Eosinophils Relative 0 %   Eosinophils Absolute 0.0 0.0 - 0.7 K/uL   Basophils Relative 0 %   Basophils Absolute 0.0 0.0 - 0.1 K/uL    Comment: Performed at [facility], [place] [address] [place], [place] [address]  Comprehensive  metabolic panel     Status: Abnormal   Collection Time: 07/12/17  5:28 AM  Result Value Ref Range   Sodium 141 135 - 145 mmol/L   Potassium 3.2 (L) 3.5 - 5.1 mmol/L   Chloride 110 101 - 111 mmol/L   CO2 20 (L) 22 - 32 mmol/L   Glucose, Bld 111 (H) 65 - 99 mg/dL   BUN 56 (H) 6 - 20 mg/dL   Creatinine, Ser 4.51 (H) 0.44 - 1.00 mg/dL   Calcium 8.1 (L) 8.9 - 10.3 mg/dL   Total Protein 4.9 (L) 6.5 - 8.1 g/dL   Albumin 2.1 (L) 3.5 - 5.0 g/dL   AST 622 (H) 15 - 41  U/L   ALT 592 (H) 14 - 54 U/L   Alkaline Phosphatase 93 38 - 126 U/L   Total Bilirubin 0.5 0.3 - 1.2 mg/dL   GFR calc non Af Amer 9 (L) >60 mL/min   GFR calc Af Amer 10 (L) >60 mL/min    Comment: (NOTE) The eGFR has been calculated using the CKD EPI equation. This calculation has not been validated in all clinical situations. eGFR's persistently <60 mL/min signify possible Chronic Kidney Disease.    Anion gap 11 5 - 15    Comment: Performed at [facility], [place] [address], [place], [place] [address]  Magnesium     Status: None   Collection Time: 07/12/17  5:28 AM  Result Value Ref Range   Magnesium 2.1 1.7 - 2.4 mg/dL    Comment: Performed at [facility], [place] [address], [place], [place] [address]  Phosphorus     Status: Abnormal   Collection Time: 07/12/17  5:28 AM  Result Value Ref Range   Phosphorus 5.2 (H) 2.5 - 4.6 mg/dL    Comment: Performed at [facility], [place] [address], [place], [place] [address]    US Abdomen Complete  Result Date: 07/11/2017 CLINICAL DATA:  Elevated LFTs EXAM: ABDOMEN ULTRASOUND COMPLETE COMPARISON:  Ultrasound 08/25/2010 FINDINGS: Gallbladder: Prior cholecystectomy Common bile duct: Diameter: Dilated, 7-8 mm, likely related to patient's age and post cholecystectomy state. Liver: Heterogeneous echotexture. No focal abnormality. Portal vein is patent on color Doppler imaging with normal direction of blood flow towards the liver. IVC: No abnormality visualized. Pancreas: Visualized portion unremarkable. Spleen: Size and appearance within normal limits. Right Kidney: Length: 10.9 cm. Mildly increased echotexture. No mass or hydronephrosis. Left Kidney: Length: 10.7 cm. Mildly increased echotexture. No mass or hydronephrosis. Abdominal aorta: No aneurysm visualized. Other findings: None. IMPRESSION: No acute findings. Mildly increased echotexture in the kidneys bilaterally suggesting early chronic medical  renal disease. Electronically Signed   By: Rolm Baptise M.D.   On: 07/11/2017 01:53    ROS negative except above Blood pressure 133/72, pulse 78, temperature 98.4 F (36.9 C), resp. rate 19, height _0  (1.626 m), weight 68.5 kg (151 lb), SpO2 97 %. Physical Exam patient lying comfortably in the bed answering questions appropriately exam pertinent for her abdomen being soft nontender good bowel sounds labs reviewed  Assessment/Plan: Multiple medical problems including chronic anemia and guaiac positivity on nonsteroidals she also has elevated liver tests which are probably from her rhabdo and seem to be decreasing nicely Plan: We will be available if signs of active bleeding otherwise would consider an endoscopy prior to discharge and will check on tomorrow and please call us sooner if any specific question otherwise continue to follow liver tests back to normal and with her  history of Bill[place]th II gastrectomy and BUN and creatinine once a day pump inhibitors is probably fine and could probably even change to oral  Danyella Mcginty E 07/12/2017, 3:58 PM

## 2017-07-12 NOTE — Evaluation (Signed)
Physical Therapy Evaluation Patient Details Name: Sara Fox MRN: [id number] DOB: 1940/11/12 Today's Date: 07/12/2017   History of Present Illness  77 yo female admitted with weakness, confusion, diarrhea, ARF. hx of CHF, HTN, CAD, falls, L wrist fx?  Clinical Impression  On eval, pt required Mod-Max assist for bed mobility. Unable to safely attempt OOB activity with +1 assist. Pt is very weak. Poor static sitting balance. Currently at risk for falls. No family present during session. Will follow and progress activity as able. Recommend SNF for continued rehab.     Follow Up Recommendations SNF    Equipment Recommendations  None recommended by PT    Recommendations for Other Services       Precautions / Restrictions Precautions Precautions: Fall Precaution Comments: incontinent Restrictions Weight Bearing Restrictions: No      Mobility  Bed Mobility Overal bed mobility: Needs Assistance Bed Mobility: Rolling;Supine to Sit;Sit to Supine Rolling: Min assist   Supine to sit: Mod assist Sit to supine: Max assist   General bed mobility comments: Increased time. Mod cueing required. Assist for trunk and bil LEs. Utilized bedpad for scooting, positioning. Sat EOB for 4-5 mintues with Min assist to stabilize.   Transfers                 General transfer comment: NT-not safe to attempt with +1 assist. Pt is weak  Ambulation/Gait                Stairs            Wheelchair Mobility    Modified Rankin (Stroke Patients Only)       Balance Overall balance assessment: Needs assistance Sitting-balance support: Bilateral upper extremity supported;Feet supported Sitting balance-Leahy Scale: Poor Sitting balance - Comments: Min assist to stabilize. Multidirectional swaying noted at times                                     Pertinent Vitals/Pain Pain Assessment: Faces Faces Pain Scale: Hurts even more Pain Location: R LE Pain  Intervention(s): Limited activity within patient's tolerance;Repositioned    Home Living Family/patient expects to be discharged to:: Unsure Living Arrangements: Children(son) Available Help at Discharge: Family Type of Home: House Home Access: Stairs to enter Entrance Stairs-Rails: None Entrance Stairs-Number of Steps: 3 Home Layout: One level Home Equipment: [place] - 2 wheels      Prior Function Level of Independence: Needs assistance   Gait / Transfers Assistance Needed: per pt, sometimes she can walk around house. She stated that she requires assistance           Hand Dominance        Extremity/Trunk Assessment   Upper Extremity Assessment Upper Extremity Assessment: Generalized weakness(limited ROM L UE. PEr chart, hx of L wrist fx?)    Lower Extremity Assessment Lower Extremity Assessment: RLE deficits/detail;LLE deficits/detail RLE Deficits / Details: Strength ~2/5 throughout LLE Deficits / Details: Strength ~2/5 throughout       Communication   Communication: No difficulties  Cognition Arousal/Alertness: Awake/alert Behavior During Therapy: WFL for tasks assessed/performed Overall Cognitive Status: No family/caregiver present to determine baseline cognitive functioning                                        General Comments      Exercises  Assessment/Plan    PT Assessment Patient needs continued PT services  PT Problem List Decreased strength;Decreased balance;Decreased mobility;Decreased activity tolerance;Pain;Decreased cognition;Decreased safety awareness;Decreased knowledge of use of DME       PT Treatment Interventions DME instruction;Gait training;Functional mobility training;Therapeutic activities;Balance training;Patient/family education;Therapeutic exercise    PT Goals (Current goals can be found in the Care Plan section)  Acute Rehab PT Goals Patient Stated Goal: none stated PT Goal Formulation: With patient Time  For Goal Achievement: 07/26/17 Potential to Achieve Goals: Fair    Frequency Min 3X/week   Barriers to discharge        Co-evaluation               AM-PAC PT "6 Clicks" Daily Activity  Outcome Measure Difficulty turning over in bed (including adjusting bedclothes, sheets and blankets)?: Unable Difficulty moving from lying on back to sitting on the side of the bed? : Unable Difficulty sitting down on and standing up from a chair with arms (e.g., wheelchair, bedside commode, etc,.)?: Unable Help needed moving to and from a bed to chair (including a wheelchair)?: Total Help needed walking in hospital room?: Total Help needed climbing 3-5 steps with a railing? : Total 6 Click Score: 6    End of Session   Activity Tolerance: Patient limited by fatigue Patient left: in bed;with call bell/phone within reach   PT Visit Diagnosis: Muscle weakness (generalized) (M62.81);Difficulty in walking, not elsewhere classified (R26.2);Other abnormalities of gait and mobility (R26.89)    Time: 0177-9390 PT Time Calculation (min) (ACUTE ONLY): 34 min   Charges:   PT Evaluation $PT Eval Moderate Complexity: 1 Mod PT Treatments $Therapeutic Activity: 8-22 mins   PT G Codes:          Weston Anna, MPT Pager: [phone]

## 2017-07-12 NOTE — Progress Notes (Signed)
PROGRESS NOTE    Sara Fox  OFB:[id number] DOB: 1941-05-03 DOA: 07/10/2017 PCP: [name] Chard, MD   Brief Narrative:  Sara Fox is a 77 y.o. female with medical history significant of HTN, HLD, systolic CHF last EF 78-24% with grade 1 dFx, CAD; who presents with weakness and confusion over the last 1 week.  Patient reports having diarrhea going multiple times per day with dark stools.  Last bowel movement noted yesterday night.  Family reported patient having intermittent confusion, with increased lethargy, and weakness.  Associated symptoms include complaints of right leg pain, lower abdominal pain, dysuria, and lower extremity swelling. Patient has been more sedimentary.  Last month she has suffered a left wrist fracture.  Denies any recent fever, shortness of breath, antibiotics, travel, or falls.  She was seen by her PCP today and blood work was obtained.  Patient was called back and advised to come in to the emergency department for further evaluation due to abnormal labs.  Upon admission into the emergency department patient was noted to be afebrile with vital signs relatively within normal limits. She was found to have Acute Renal Failure and also found to have Rhabdomyolysis. She also has had Melena but Hb/Hct has been Stable. GI has been consulted and appreciate further evaluation and recommendations.  Assessment & Plan:   Principal Problem:   ARF (acute renal failure) (HCC) Active Problems:   Normocytic anemia   Bilateral lower extremity edema   Rhabdomyolysis   Melena   Hypoalbuminemia   Elevated LFTs   Malnutrition of moderate degree  Acute Kidney Injury/Acute Renal Failure -Acute. Patient's baseline creatinine previously noted to be within normal limits, but she presented with a BUN of 43 and creatinine 3.82.  - Urinalysis was positive for large hemoglobin but negative for RBCs given concern for rhabdomyolysis.   -Given patient taking diuretics suspect prerenal  cause of symptoms and lack of mobility . -Admitted to telemetry bed  -Strict intake and output -Check urine sodium, urea, and creatinine - Bolus 500 mL of normal saline fluids then placed at a rate of 125 ml/hr as tolerated; Changed IVF to Isotonic Sodium Bicarbonate at a rate of 75 mL/hr yesterday and Nephrology changed rate to 150 mL/hr today  -Checked Abdominal ultrasound and showed no acute findings but mildly increased echotexture in the kidnesy bilaterally suggesting early chronic medical renal disease -Hold nephrotoxic agents -BUN/Cr now worsened to 56/4.51 -Urinalysis had 100 Protein  -Nephrology consulted and appreciate further evaluation and recommendations -Nephrology checking serologies for Acute Glomerulonephritis and checking ANA, IFA, MPO/Pr3 (ANCA) -Nephrology recommending Volume expansion to force diuresis and recommending using IV Furosemide as needed to help augment Urine Output  Acute Rhabdomyolysis -Acute. CK elevated at 40,213 on admission and now improved to 18,056 -IV fluids as seen above; Nephrology increased Isotonic Bicarbonate to 150 mL/hr -Check daily CPK   Melena -Acute.  Patient reports having dark diarrhea over the last 1-2 weeks; No episodes here -Stool guaiacs were ordered and noted to be weakly positive.   -No reported recent antibiotic use but has been taking Daily Ibuprofen.  Question possibility of upper GI bleed given elevated BUN. -Type and screen for possible need of blood products -Monitor H&H; Hb/Hct went from 9.7/30.0 -> 9.1/27.8 -> 9.3/28.0 -C/w Protonix  40mg  IV BID -Gastroenterology Dr. Watt Climes consulted for further evaluation and recommendations   Normocytic Normochromic Anemia -Hemoglobin noted to be 9.7 on admission. Baseline hemoglobin previously noted to be around 10-11. -Hb/Hct went from 9.7/30.0 -> 9.1/27.8 ->  9.3/28.0 -Nephrology checking Iron Studies  -Continue to Monitor for S/Sx of Bleeding -Repeat CBC in AM    Acute  Encephalopathy, improving -Suspected secondary to dehydration. -Neurochecks -If continues to persist will get Head CT w/o Contrast but will hold off for now  Hypokalemia -Acute.  Initial potassium noted to be 2.8 on admission and now improved to 3.2. -Patient has been taking furosemide at home given 10 mEq of potassium chloride IV in ED. -K+ Being replete by Nephrology with 10 mEQ BID -Continue to monitor and replace as needed  History of Systolic CHF -Last EF noted to be around 45-50% with grade 1 diastolic dysfunction in 9518. -Hold furosemide and lisinopril due to acute renal failure -Continue Metoprolol 50 mg po BIDas tolerated -Strict I's/O's, Daily Weights -Repeat ECHOCardiogram done and pending  -Placed Foley for accurate Monitoring and patient is +2.003 Liters  -Weight is down 11 lbs  Lower Extremity Edema -Patient with at least 2+ pitting lower extremity edema.  Symptoms likely could be secondary to hypoalbuminemia vs. Heart Failure  -Checked Doppler duplex ultrasound and was Negative for DVT  CAD -Status post DES stent placement and 09/2014. -Currently Holding Brilinta and Aspirin  Abnormal/Elevated LFTs, improving -Acute.   -Patient with significantly elevated AST and ALT which correlates with rhabdomyolysis;  -AST went from 1239 -> 992 -> 622 and ALT went from 843 -> 740 -> 592 -Levels should correct with IV fluids alone and now Trending down -Recheck CMP AM  Hypoalbuminemia -Acute.  Patient's initial albumin noted to be 2.7 on admission; Albumin now 2.1 -Checked prealbumin this AM and was 16.3 -Dietician/Nutritionist Consult appreciated  Hyperlipidemia  -Continue to Hold statin   Hyperphosphatemia -In the setting of Acute Renal Failure -Continue to Monitor Phos Levels and repeat in AM   Generalized Weakness -PT/OT Evaluation  Essential Hypertension -Home BP medications currently being Held (ACE-I) -C/w Metoprolol 50 mg po BID -Nephrology  recommending beginning Procardia XL qHS if remains elevated  Moderate Malnutrition in the Context of Chronic Illness -Nutritionist consulted and appreciate Evaluation and recommendations -C/w Ensure Enlive 237 mL po BID  DVT prophylaxis: SCDs Code Status: FULL CODE Family Communication: No family present at bedside  Disposition Plan: Remain Inpateint for Continued Workup and Treatment; PT Evaluation  Consultants:   Nephrology  Gastroenterology    Procedures:  Bilateral Lower Extremity Duplex Negative for obvious evidence of DVT.  ECHOCARDIOGRAM (ordered and pending)   Antimicrobials:  Anti-infectives (From admission, onward)   None     Subjective: Seen and examined at bedside and was on the phone the entire time I was in the room and would not get off to speak with me. Denied any complaints but complained of abdominal tenderness when I examined her abdomen. Patient remained on the phone during the entire visit, and the nurse at bedside updated me that she was not making as much urine.   Objective: Vitals:   07/12/17 0530 07/12/17 0850 07/12/17 1035 07/12/17 1220  BP: (!) 146/64  (!) 148/61   Pulse: 70  70   Resp: 20     Temp: 98 F (36.7 C)     TempSrc: Axillary     SpO2: 98% 94%    Weight:      Height:    5\' 4"  (1.626 m)    Intake/Output Summary (Last 24 hours) at 07/12/2017 1251 Last data filed at 07/12/2017 8416 Gross per 24 hour  Intake 1293.75 ml  Output 500 ml  Net 793.75 ml   Sara Fox  Weights   07/11/17 2011 07/12/17 0500  Weight: 73.8 kg (162 lb 11.2 oz) 68.5 kg (151 lb)   Examination: Physical Exam:  Constitutional: AAF who is awake and talking on phone and appears in NAD Eyes: Sclerae anicteric; Lids Normal.  ENMT: External Ears and Nose appear normal. Slightly hard of hearing Neck: Supple with no JVD Respiratory: Diminished to auscultation; No appreciable wheezing/rales/rhonchi. Unlabored breathing and no accessory muscle use Cardiovascular:  RRR; S1/S2; Has Some LE Edema  Abdomen: Soft, Mildly Tender, slightly distended due to body habitus. Bowel sounds present  GU: Deferred; Has foley Catheter in place Musculoskeletal: No contractures; No cyanosis Skin: Warm and Dry. No appreciable rashes Neurologic: CN 2-12 grossly intact. No appreciable focal deficits on limited exam due to patient cooperation Psychiatric: Awake and talking on the phone. Normal mood and affect  Data Reviewed: I have personally reviewed following labs and imaging studies  CBC: Recent Labs  Lab 07/10/17 1906 07/11/17 0527 07/12/17 0528  WBC 9.0 9.9 9.5  NEUTROABS  --  8.0* 8.0*  HGB 9.7* 9.1* 9.3*  HCT 30.0* 27.8* 28.0*  MCV 98.4 97.5 96.2  PLT 335 316 626   Basic Metabolic Panel: Recent Labs  Lab 07/10/17 1906 07/11/17 0527 07/12/17 0528  NA 141 138 141  K 2.8* 3.2* 3.2*  CL 107 109 110  CO2 19* 17* 20*  GLUCOSE 112* 79 111*  BUN 43* 47* 56*  CREATININE 3.82* 3.92* 4.51*  CALCIUM 8.3* 7.6* 8.1*  MG 2.3  --  2.1  PHOS  --   --  5.2*   GFR: Estimated Creatinine Clearance: 9.9 mL/min (A) (by C-G formula based on SCr of 4.51 mg/dL (H)). Liver Function Tests: Recent Labs  Lab 07/10/17 2236 07/11/17 0527 07/12/17 0528  AST 1,239* 992* 622*  ALT 843* 740* 592*  ALKPHOS 102 93 93  BILITOT 0.8 0.7 0.5  PROT 5.9* 5.1* 4.9*  ALBUMIN 2.7* 2.3* 2.1*   No results for input(s): LIPASE, AMYLASE in the last 168 hours. No results for input(s): AMMONIA in the last 168 hours. Coagulation Profile: No results for input(s): INR, PROTIME in the last 168 hours. Cardiac Enzymes: Recent Labs  Lab 07/10/17 1906 07/11/17 0527 07/12/17 0528  CKTOTAL 40,213* 26,183* 18,056*   BNP (last 3 results) No results for input(s): PROBNP in the last 8760 hours. HbA1C: No results for input(s): HGBA1C in the last 72 hours. CBG: No results for input(s): GLUCAP in the last 168 hours. Lipid Profile: No results for input(s): CHOL, HDL, LDLCALC, TRIG,  CHOLHDL, LDLDIRECT in the last 72 hours. Thyroid Function Tests: Recent Labs    07/10/17 1914  TSH 0.926   Anemia Panel: No results for input(s): VITAMINB12, FOLATE, FERRITIN, TIBC, IRON, RETICCTPCT in the last 72 hours. Sepsis Labs: No results for input(s): PROCALCITON, LATICACIDVEN in the last 168 hours.  No results found for this or any previous visit (from the past 240 hour(s)).   Radiology Studies: US Abdomen Complete  Result Date: 07/11/2017 CLINICAL DATA:  Elevated LFTs EXAM: ABDOMEN ULTRASOUND COMPLETE COMPARISON:  Ultrasound 08/25/2010 FINDINGS: Gallbladder: Prior cholecystectomy Common bile duct: Diameter: Dilated, 7-8 mm, likely related to patient's age and post cholecystectomy state. Liver: Heterogeneous echotexture. No focal abnormality. Portal vein is patent on color Doppler imaging with normal direction of blood flow towards the liver. IVC: No abnormality visualized. Pancreas: Visualized portion unremarkable. Spleen: Size and appearance within normal limits. Right Kidney: Length: 10.9 cm. Mildly increased echotexture. No mass or hydronephrosis. Left Kidney: Length: 10.7 cm.  Mildly increased echotexture. No mass or hydronephrosis. Abdominal aorta: No aneurysm visualized. Other findings: None. IMPRESSION: No acute findings. Mildly increased echotexture in the kidneys bilaterally suggesting early chronic medical renal disease. Electronically Signed   By: Rolm Baptise M.D.   On: 07/11/2017 01:53   Scheduled Meds: . feeding supplement (ENSURE ENLIVE)  237 mL Oral BID BM  . gabapentin  100 mg Oral QHS  . metoprolol tartrate  50 mg Oral BID WC  . mometasone-formoterol  2 puff Inhalation BID  . pantoprazole (PROTONIX) IV  40 mg Intravenous Q12H   Continuous Infusions: .  sodium bicarbonate  infusion 1000 mL 75 mL/hr at 07/12/17 0515    LOS: 1 day   Kerney Elbe, DO [facility] Pager [phone]  If 7PM-7AM, please contact night-coverage [email or web address] Password  [facility] 07/12/2017, 12:51 PM

## 2017-07-12 NOTE — Consult Note (Addendum)
Reason for Consult: Acute kidney injury  Referring Physician: Baird Kay M.D. [facility])  HPI:  77 year old African-American woman with past medical history significant for hypertension, dyslipidemia, coronary artery disease status post PCI, diastolic heart failure (based on 2016 echocardiogram), history of cervical cancer status post TAH and what appears to be essentially normal renal function at baseline. Per my telephone conversation with her primary care provider's office, her creatinine in early 2018 was 0.8 and when checked on 07/06/17 prior to her referral to the emergency room, creatinine was 1.26. At that time, concern was raised regarding hematuria on dipstick assay.  The patient reports 2 weeks of increasing weakness and confusion with at least 4 falls. She also reported some diarrhea with dark stools and occasional abdominal pain but denied any vomiting or diarrhea. She reports some cough that is nonproductive but denies any shortness of breath, fever or chills. She denies any preceding antibiotic use or NSAID use. She denies any prior history of acute kidney injury, history of recurrent nephrolithiasis or history of recurrent urinary tract infections.  On evaluation in the emergency room, she was found to have an elevated creatinine of 3.8, elevated transaminases and a CPK level elevated at 40,000. Over the past 2 days, she has had intravenous fluids without significant improvement of her renal function. Her urine output has been barely nonoliguric. Prior to admission, she was on lisinopril 10 mg daily and furosemide 20 mg twice a day.  Past Medical History:  Diagnosis Date  . Back pain   . Cervical cancer ([place])   . Hyperlipidemia   . Hypertension   . Neck pain   . Stomach problems     Past Surgical History:  Procedure Laterality Date  . ABDOMINAL HYSTERECTOMY    . BACK SURGERY  2012   lower back  . CARDIAC CATHETERIZATION  06/1999   noncritical disease invovling PDA  . CARDIAC  CATHETERIZATION N/A 09/14/2014   Procedure: Left Heart Cath and Coronary Angiography;  Surgeon: Leonie Man, MD;  Location: [facility] INVASIVE CV LAB CUPID;  Service: Cardiovascular;  Laterality: N/A;  . CHOLECYSTECTOMY    . KNEE SURGERY Bilateral 2001 & 2007  . NECK SURGERY  2012   2012  . PARTIAL GASTRECTOMY  2005   subtotal  . PERCUTANEOUS CORONARY STENT INTERVENTION (PCI-S)  09/14/2014   Procedure: Percutaneous Coronary Stent Intervention (Pci-S);  Surgeon: Leonie Man, MD;  Location: [facility] INVASIVE CV LAB CUPID;  Service: Cardiovascular;;  . SHOULDER SURGERY Right   . TRANSTHORACIC ECHOCARDIOGRAM  06/02/2010   EF=>55%, normal LV systolic function; normal RV systolic function; mild mitral annular calcif; trace TR; AV mildly sclerotic    Family History  Problem Relation Age of Onset  . Heart disease Mother        also HTN  . Diabetes Mother   . Colon cancer Mother   . Heart disease Brother        deceased at 10  . Hypertension Brother   . Heart attack Brother   . Heart disease Brother   . Hypertension Brother     Social History:  reports that she has been smoking.  She has a 60.00 pack-year smoking history. she has never used smokeless tobacco. She reports that she does not drink alcohol or use drugs.  Allergies:  Allergies  Allergen Reactions  . Buprenorphine Hcl Shortness Of Breath    Throat swelling/trouble breathing and lethargic  . Celebrex [Celecoxib] Diarrhea and Swelling    Swelling of legs   .  Morphine And Related Shortness Of Breath    Throat swelling/trouble breathing and lethargic  . Codeine Other (See Comments)    Bloated   . Vioxx [Rofecoxib] Palpitations    Medications:  Scheduled: . feeding supplement (ENSURE ENLIVE)  237 mL Oral BID BM  . gabapentin  100 mg Oral QHS  . metoprolol tartrate  50 mg Oral BID WC  . mometasone-formoterol  2 puff Inhalation BID  . pantoprazole (PROTONIX) IV  40 mg Intravenous Q12H    BMP Latest Ref Rng & Units 07/12/2017  07/11/2017 07/10/2017  Glucose 65 - 99 mg/dL 111(H) 79 112(H)  BUN 6 - 20 mg/dL 56(H) 47(H) 43(H)  Creatinine 0.44 - 1.00 mg/dL 4.51(H) 3.92(H) 3.82(H)  Sodium 135 - 145 mmol/L 141 138 141  Potassium 3.5 - 5.1 mmol/L 3.2(L) 3.2(L) 2.8(L)  Chloride 101 - 111 mmol/L 110 109 107  CO2 22 - 32 mmol/L 20(L) 17(L) 19(L)  Calcium 8.9 - 10.3 mg/dL 8.1(L) 7.6(L) 8.3(L)   CBC Latest Ref Rng & Units 07/12/2017 07/11/2017 07/10/2017  WBC 4.0 - 10.5 K/uL 9.5 9.9 9.0  Hemoglobin 12.0 - 15.0 g/dL 9.3(L) 9.1(L) 9.7(L)  Hematocrit 36.0 - 46.0 % 28.0(L) 27.8(L) 30.0(L)  Platelets 150 - 400 K/uL 310 316 335     US Abdomen Complete  Result Date: 07/11/2017 CLINICAL DATA:  Elevated LFTs EXAM: ABDOMEN ULTRASOUND COMPLETE COMPARISON:  Ultrasound 08/25/2010 FINDINGS: Gallbladder: Prior cholecystectomy Common bile duct: Diameter: Dilated, 7-8 mm, likely related to patient's age and post cholecystectomy state. Liver: Heterogeneous echotexture. No focal abnormality. Portal vein is patent on color Doppler imaging with normal direction of blood flow towards the liver. IVC: No abnormality visualized. Pancreas: Visualized portion unremarkable. Spleen: Size and appearance within normal limits. Right Kidney: Length: 10.9 cm. Mildly increased echotexture. No mass or hydronephrosis. Left Kidney: Length: 10.7 cm. Mildly increased echotexture. No mass or hydronephrosis. Abdominal aorta: No aneurysm visualized. Other findings: None. IMPRESSION: No acute findings. Mildly increased echotexture in the kidneys bilaterally suggesting early chronic medical renal disease. Electronically Signed   By: Rolm Baptise M.D.   On: 07/11/2017 01:53    Review of Systems  Constitutional: Positive for malaise/fatigue. Negative for chills and fever.  HENT: Negative.   Eyes: Negative.   Respiratory: Positive for cough. Negative for sputum production, shortness of breath and wheezing.   Cardiovascular: Negative.   Gastrointestinal: Positive for  abdominal pain, diarrhea and melena. Negative for constipation and vomiting.  Genitourinary: Negative.   Musculoskeletal: Positive for myalgias.  Skin: Negative.   Neurological: Positive for dizziness, weakness and headaches. Negative for focal weakness.  Psychiatric/Behavioral: Positive for memory loss.   Blood pressure (!) 148/61, pulse 70, temperature 98 F (36.7 C), temperature source Axillary, resp. rate 20, height 5\' 4"  (1.626 m), weight 68.5 kg (151 lb), SpO2 94 %. Physical Exam  Nursing note and vitals reviewed. Constitutional: She is oriented to person, place, and time. She appears well-developed and well-nourished. No distress.  HENT:  Head: Normocephalic and atraumatic.  Mouth/Throat: Oropharynx is clear and moist.  Eyes: EOM are normal. Pupils are equal, round, and reactive to light. No scleral icterus.  Neck: Normal range of motion. Neck supple. No JVD present. No thyromegaly present.  Cardiovascular: Normal rate and normal heart sounds.  No murmur heard. Respiratory: Effort normal and breath sounds normal. She has no wheezes. She has no rales.  GI: Soft. Bowel sounds are normal. There is no tenderness. There is no rebound and no guarding.  Musculoskeletal: Normal range of motion.  She exhibits no edema.  Neurological: She is alert and oriented to person, place, and time.  Very poor recall  Skin: Skin is warm and dry. No rash noted. No erythema.    Assessment/Plan: 1. Acute kidney injury: Suspect that this is possibly multifactorial from rhabdomyolysis and possibly ATN. Poor urine output noted and at this time, agree with efforts at volume expansion to try to force diuresis. I will check some serologies with low pretest suspicion of acute glomerulonephritis. She is euvolemic at this time but I will maintain caution to avoid tipping her into volume overload with history of diastolic dysfunction/congestive heart failure. Will increase the rate of her isotonic sodium bicarbonate  as this appears to be permissive at this time and utilize intravenous furosemide as needed to help augment urine output. She does not have any indications for hemodialysis at this time but remains high risk. ACE inhibitor and statins currently on hold. Abdominal ultrasound did not show any hydronephrosis and she has a Foley catheter in situ. Surprisingly, suggestion of chronic kidney disease noted on renal ultrasound. 2. Rhabdomyolysis: Suspected to be secondary to recent falls and ongoing statin use. Agree with holding statin and volume expansion. 3. Anemia: She is Hemoccult positive and likely needs evaluation by gastroenterology when more stable. Will check iron studies. 4. Hypertension: Blood pressure is marginally elevated, will begin daily at bedtime Procardia XL if pressures remain elevated. 5. Hypokalemia: Secondary to losses from diarrhea and shifts from isotonic sodium bicarbonate drip, will replace via oral rule out cautiously.  Sanaa Zilberman K. 07/12/2017, 12:42 PM

## 2017-07-12 NOTE — Progress Notes (Signed)
  Echocardiogram 2D Echocardiogram has been performed.  Bobbye Charleston 07/12/2017, 1:59 PM

## 2017-07-12 NOTE — Progress Notes (Signed)
After Dr. Posey Pronto left, patient requested to pass on that she had been taking up to 800 mg Ibuprofen a day.  Unable to page Dr. Posey Pronto, no longer on-call through [facility].

## 2017-07-13 LAB — COMPREHENSIVE METABOLIC PANEL
ALT: 583 U/L — ABNORMAL HIGH (ref 14–54)
AST: 529 U/L — ABNORMAL HIGH (ref 15–41)
Albumin: 2 g/dL — ABNORMAL LOW (ref 3.5–5.0)
Alkaline Phosphatase: 91 U/L (ref 38–126)
Anion gap: 13 (ref 5–15)
BUN: 62 mg/dL — ABNORMAL HIGH (ref 6–20)
CO2: 23 mmol/L (ref 22–32)
Calcium: 8.1 mg/dL — ABNORMAL LOW (ref 8.9–10.3)
Chloride: 104 mmol/L (ref 101–111)
Creatinine, Ser: 4.65 mg/dL — ABNORMAL HIGH (ref 0.44–1.00)
GFR calc Af Amer: 10 mL/min — ABNORMAL LOW (ref >60)
GFR calc non Af Amer: 8 mL/min — ABNORMAL LOW (ref >60)
Glucose, Bld: 123 mg/dL — ABNORMAL HIGH (ref 65–99)
Potassium: 3.2 mmol/L — ABNORMAL LOW (ref 3.5–5.1)
Sodium: 140 mmol/L (ref 135–145)
Total Bilirubin: 0.5 mg/dL (ref 0.3–1.2)
Total Protein: 4.7 g/dL — ABNORMAL LOW (ref 6.5–8.1)

## 2017-07-13 LAB — IRON AND TIBC
Iron: 69 ug/dL (ref 28–170)
Saturation Ratios: 33 % — ABNORMAL HIGH (ref 10.4–31.8)
TIBC: 207 ug/dL — ABNORMAL LOW (ref 250–450)
UIBC: 138 ug/dL

## 2017-07-13 LAB — CBC WITH DIFFERENTIAL/PLATELET
Basophils Absolute: 0 10*3/uL (ref 0.0–0.1)
Basophils Relative: 0 %
Eosinophils Absolute: 0 10*3/uL (ref 0.0–0.7)
Eosinophils Relative: 0 %
HCT: 26.9 % — ABNORMAL LOW (ref 36.0–46.0)
Hemoglobin: 9.2 g/dL — ABNORMAL LOW (ref 12.0–15.0)
Lymphocytes Relative: 9 %
Lymphs Abs: 0.9 10*3/uL (ref 0.7–4.0)
MCH: 32.1 pg (ref 26.0–34.0)
MCHC: 34.2 g/dL (ref 30.0–36.0)
MCV: 93.7 fL (ref 78.0–100.0)
Monocytes Absolute: 0.8 10*3/uL (ref 0.1–1.0)
Monocytes Relative: 8 %
Neutro Abs: 8.4 10*3/uL — ABNORMAL HIGH (ref 1.7–7.7)
Neutrophils Relative %: 83 %
Platelets: 279 10*3/uL (ref 150–400)
RBC: 2.87 MIL/uL — ABNORMAL LOW (ref 3.87–5.11)
RDW: 14.9 % (ref 11.5–15.5)
WBC: 10.1 10*3/uL (ref 4.0–10.5)

## 2017-07-13 LAB — FERRITIN: Ferritin: 121 ng/mL (ref 11–307)

## 2017-07-13 LAB — PHOSPHORUS: Phosphorus: 4.9 mg/dL — ABNORMAL HIGH (ref 2.5–4.6)

## 2017-07-13 LAB — CK: Total CK: 11328 U/L — ABNORMAL HIGH (ref 38–234)

## 2017-07-13 LAB — MAGNESIUM: Magnesium: 2.1 mg/dL (ref 1.7–2.4)

## 2017-07-13 MED ORDER — FUROSEMIDE 10 MG/ML IJ SOLN
40.0000 mg | Freq: Once | INTRAMUSCULAR | Status: AC
  Administered 2017-07-13: 40 mg via INTRAVENOUS
  Filled 2017-07-13: qty 4

## 2017-07-13 MED ORDER — POTASSIUM CHLORIDE CRYS ER 20 MEQ PO TBCR
20.0000 meq | EXTENDED_RELEASE_TABLET | Freq: Two times a day (BID) | ORAL | Status: AC
  Administered 2017-07-13 – 2017-07-14 (×2): 20 meq via ORAL
  Filled 2017-07-13 (×2): qty 1

## 2017-07-13 MED ORDER — PANTOPRAZOLE SODIUM 40 MG PO TBEC
40.0000 mg | DELAYED_RELEASE_TABLET | Freq: Every day | ORAL | Status: DC
  Administered 2017-07-14 – 2017-07-18 (×5): 40 mg via ORAL
  Filled 2017-07-13 (×5): qty 1

## 2017-07-13 NOTE — Evaluation (Signed)
Occupational Therapy Evaluation Patient Details Name: Sara Fox MRN: [id number] DOB: 01-Jul-1940 Today's Date: 07/13/2017    History of Present Illness 77 yo female admitted with weakness, confusion, diarrhea, ARF. hx of CHF, HTN, CAD, falls, L wrist fx 1/19 per xrays   Clinical Impression   Pt was admitted for the above. She lived at home with son and granddaugther assisted with ADLs per pt.  Will follow in acute setting with mod A level goals    Follow Up Recommendations  SNF    Equipment Recommendations  (to be further assessed)    Recommendations for Other Services       Precautions / Restrictions Precautions Precautions: Fall Precaution Comments: catheter; was incontinent x bowel Restrictions Weight Bearing Restrictions: No      Mobility Bed Mobility     Rolling: Min assist   Supine to sit: Mod assist Sit to supine: Max assist   General bed mobility comments: increased time; use of bedrail  Transfers                 General transfer comment: not tested    Balance     Sitting balance-Leahy Scale: Poor Sitting balance - Comments: several LOB when sitting EOB; min A to recover                                   ADL either performed or assessed with clinical judgement   ADL Overall ADL's : Needs assistance/impaired Eating/Feeding: Set up   Grooming: Minimal assistance;Bed level   Upper Body Bathing: Minimal assistance;Bed level   Lower Body Bathing: Total assistance;Bed level   Upper Body Dressing : Moderate assistance;Bed level   Lower Body Dressing: Total assistance;Bed level                 General ADL Comments: sat EOB; pt had several LOB.  Did not stand due to bil LE weakness.  Returned to supine to clean up after incontinence of bowel     Vision         Perception     Praxis      Pertinent Vitals/Pain Pain Assessment: No/denies pain     Hand Dominance     Extremity/Trunk Assessment Upper  Extremity Assessment Upper Extremity Assessment: LUE deficits/detail;RUE deficits/detail RUE Deficits / Details: bil shoulder limitations, approximately 45 degrees LUE Deficits / Details: L wrist fx last month.  Pt states she has a brace at home she wears sometimes           Communication Communication Communication: No difficulties   Cognition Arousal/Alertness: Awake/alert Behavior During Therapy: WFL for tasks assessed/performed Overall Cognitive Status: No family/caregiver present to determine baseline cognitive functioning                                 General Comments: decreased memory   General Comments       Exercises     Shoulder Instructions      Home Living Family/patient expects to be discharged to:: Unsure Living Arrangements: Children                                      Prior Functioning/Environment Level of Independence: Needs assistance    ADL's / Homemaking Assistance Needed: granddaughter helps with adls  OT Problem List: Decreased strength;Decreased activity tolerance;Impaired balance (sitting and/or standing);Decreased cognition;Decreased knowledge of use of DME or AE      OT Treatment/Interventions: Self-care/ADL training;DME and/or AE instruction;Patient/family education;Balance training;Therapeutic activities;Cognitive remediation/compensation    OT Goals(Current goals can be found in the care plan section) Acute Rehab OT Goals Patient Stated Goal: none stated OT Goal Formulation: With patient Time For Goal Achievement: 07/27/17 Potential to Achieve Goals: Fair ADL Goals Pt Will Transfer to Toilet: with mod assist;with +2 assist;bedside commode;squat pivot transfer Additional ADL Goal #1: pt will sit unsupported EOB with min guard and perform light adl/strengthening activity x 10 minutes Additional ADL Goal #2: pt will perform bed mobility with min A in preparation for adls  OT Frequency: Min  2X/week   Barriers to D/C:            Co-evaluation              AM-PAC PT "6 Clicks" Daily Activity     Outcome Measure Help from another person eating meals?: A Little Help from another person taking care of personal grooming?: A Little Help from another person toileting, which includes using toliet, bedpan, or urinal?: Total Help from another person bathing (including washing, rinsing, drying)?: A Lot Help from another person to put on and taking off regular upper body clothing?: A Lot Help from another person to put on and taking off regular lower body clothing?: Total 6 Click Score: 12   End of Session    Activity Tolerance: Patient tolerated treatment well Patient left: in bed;with call bell/phone within reach;with bed alarm set  OT Visit Diagnosis: Muscle weakness (generalized) (M62.81)                Time: 1008-1030 OT Time Calculation (min): 22 min Charges:  OT General Charges $OT Visit: 1 Visit OT Evaluation $OT Eval Moderate Complexity: 1 Mod G-Codes:     [place], OTR/L [phone] 07/13/2017  Deshannon Seide 07/13/2017, 12:57 PM

## 2017-07-13 NOTE — Clinical Social Work Note (Signed)
Clinical Social Work Assessment  Patient Details  Name: Sara Fox MRN: [id number] Date of Birth: 1941-01-21  Date of referral:  07/13/17               Reason for consult:  Facility Placement                Permission sought to share information with:  Case Manager, Customer service manager, Family Supports Permission granted to share information::  Yes, Verbal Permission Granted  Name::     Retail buyer::  SNF  Relationship::  Son  Sport and exercise psychologist Information:     Housing/Transportation Living arrangements for the past 2 months:  Single Family Home Source of Information:  Patient Patient Interpreter Needed:  None Criminal Activity/Legal Involvement Pertinent to Current Situation/Hospitalization:  No - Comment as needed Significant Relationships:  Adult Children, Spouse Lives with:  Adult Children Do you feel safe going back to the place where you live?  Yes Need for family participation in patient care:  Yes (Comment)  Care giving concerns:  No care giving concerns at the time of assessment.    Social Worker assessment / plan:  LCSW consulted for SNF placement.   Patient was admitted for weakness.   LCSW met at bedside with patient. Patient reports that she lives with her son Sara Fox. Patient has a spouse at [employer].   Patient reports that she is independent in her ADLs including cooking and cleaning. Patient reports that she uses a walker at baseline. Patient states that she still drives a little.   Patient is agreeable to SNF. Patient prefers [facility] since her spouse is there.   PLAN: SNF at dc.   Employment status:  Retired Nurse, adult PT Recommendations:  [place] / Referral to community resources:     Patient/Family's Response to care:  Patient and family thankful for LCSW visit and services.   Patient/Family's Understanding of and Emotional Response to Diagnosis, Current Treatment, and  Prognosis:  Patient and family understanding of diagnosis and agreeable to treatment plan.   Emotional Assessment Appearance:  Appears older than stated age Attitude/Demeanor/Rapport:    Affect (typically observed):  Calm Orientation:  Oriented to Self Alcohol / Substance use:  Not Applicable Psych involvement (Current and /or in the community):  No (Comment)  Discharge Needs  Concerns to be addressed:  No discharge needs identified Readmission within the last 30 days:  No Current discharge risk:  None Barriers to Discharge:  Continued Medical Work up, No SNF bed, Insurance Authorization   Servando Snare, LCSW 07/13/2017, 11:17 AM

## 2017-07-13 NOTE — Progress Notes (Signed)
PROGRESS NOTE    Sara Fox  UGQ:[id number] DOB: Jul 19, 1940 DOA: 07/10/2017 PCP: [name] Chard, MD   Brief Narrative:  Sara Fox is a 77 y.o. female with medical history significant of HTN, HLD, systolic CHF last EF 88-28% with grade 1 dFx, CAD; who presents with weakness and confusion over the last 1 week.  Patient reports having diarrhea going multiple times per day with dark stools.  Last bowel movement noted yesterday night.  Family reported patient having intermittent confusion, with increased lethargy, and weakness.  Associated symptoms include complaints of right leg pain, lower abdominal pain, dysuria, and lower extremity swelling. Patient has been more sedimentary.  Last month she has suffered a left wrist fracture.  Denies any recent fever, shortness of breath, antibiotics, travel, or falls.  She was seen by her PCP today and blood work was obtained.  Patient was called back and advised to come in to the emergency department for further evaluation due to abnormal labs.  Upon admission into the emergency department patient was noted to be afebrile with vital signs relatively within normal limits. She was found to have Acute Renal Failure and also found to have Rhabdomyolysis. Renal Consulted and managing Fluids. She also has had Melena but Hb/Hct has been Stable. GI has been consulted and recommending continue to observe and re-evaluate if Endoscopy is needed next week.   Assessment & Plan:   Principal Problem:   ARF (acute renal failure) (HCC) Active Problems:   Normocytic anemia   Bilateral lower extremity edema   Rhabdomyolysis   Melena   Hypoalbuminemia   Elevated LFTs   Malnutrition of moderate degree  Acute Kidney Injury/Acute Renal Failure likely multifactorial in the setting of Rhabdomyolysis and Likely ATN -Acute. Patient's baseline creatinine previously noted to be within normal limits, but she presented with a BUN of 43 and creatinine 3.82.  - Urinalysis was  positive for large hemoglobin but negative for RBCs given concern for rhabdomyolysis.   -Given patient taking diuretics suspect prerenal cause of symptoms and lack of mobility . -Admitted to telemetry bed  -Strict intake and output; Patient is +2.692 Liters  -C/w with Isotonic Sodium Bicarbonate at a rate of 150 mL/hr -Checked Abdominal ultrasound and showed no acute findings but mildly increased echotexture in the kidnesy bilaterally suggesting early chronic medical renal disease -Hold Nephrotoxic Agents -BUN/Cr now worsened to 62/4.65 -Urinalysis had 100 Protein  -Nephrology consulted and appreciate further evaluation and recommendations -Nephrology checking serologies for Acute Glomerulonephritis and checking ANA, IFA, MPO/Pr3 (ANCA) -Nephrology recommending Volume expansion to force diuresis and recommending using IV Furosemide as needed to help augment Urine Output -Nephrology giving IV Lasix 40 mg x1 -UOP improved slightly and Nephrology feeling like she is possibly approaching plateau phase of ATN  Acute Rhabdomyolysis -Acute. CK elevated at 40,213 on admission and now improved to 11,328 -IV fluids as seen above; C/w Isotonic Bicarbonate to 150 mL/hr per Nephrology Recc's -Check daily CPK   Melena -Acute.  Patient reports having dark diarrhea over the last 1-2 weeks; No episodes here -Stool guaiacs were ordered and noted to be weakly positive.   -No reported recent antibiotic use but has been taking Daily Ibuprofen.  Question possibility of upper GI bleed given elevated BUN. -Type and screen for possible need of blood products -Monitor H&H; Hb/Hct went from 9.7/30.0 -> 9.1/27.8 -> 9.3/28.0 -> 9.2/26.9 -Changed Protonix 40mg  IV BID to 40 mg po Daily per GI recc's yesterday  -Gastroenterology Dr. Watt Climes consulted for further evaluation  and recommendations; GI observing for now and will re-evaluate need for Endoscopy early next week   Normocytic Normochromic Anemia -Hemoglobin  noted to be 9.7 on admission. Baseline hemoglobin previously noted to be around 10-11. -Hb/Hct went from 9.7/30.0 -> 9.1/27.8 -> 9.3/28.0 -> 9.2/26.9 -Anemia Panel showed Iron Level of 69, UBIC of 138, TIBC of 207, Saturation Ratio of 33, and Ferritin of 121 -Continue to Monitor for S/Sx of Bleeding -Repeat CBC in AM    Acute Encephalopathy, improving -Suspected secondary to dehydration. -Neurochecks -If continues to persist will get Head CT w/o Contrast but will hold off for now  Hypokalemia -Acute.  Initial potassium noted to be 2.8 on admission and now improved to 3.2.  -Patient has been taking furosemide at home given 10 mEq of potassium chloride IV in ED. -K+ Being replete by Nephrology with 20 mEQ po BID x 2 doses -Continue to monitor and replace as needed  History of Combined Systolic and Diastolic CHF -Last EF noted to be around 45-50% with grade 1 diastolic dysfunction in 0277. -Hold furosemide and lisinopril due to acute renal failure -Continue Metoprolol 50 mg po BIDas tolerated -Strict I's/O's, Daily Weights -Repeat ECHOCardiogram done this visit showed EF of 60-65% and Grade 1 DD -Placed Foley for accurate Monitoring and patient is +2.692 Liters  -Weight is down 18 lbs  Lower Extremity Edema, improved -Patient with at least 2+ pitting lower extremity edema on Admission.  Symptoms likely could be secondary to hypoalbuminemia vs. Heart Failure  -Checked Doppler duplex ultrasound and was Negative for DVT -Continue to Monitor   CAD -Status post DES stent placement and 09/2014. -Currently Holding Brilinta and Aspirin  Abnormal/Elevated LFTs, improving -Acute.   -Patient with significantly elevated AST and ALT which correlates with rhabdomyolysis;  -AST went from 1239 -> 992 -> 622 -> 529 -ALT went from 843 -> 740 -> 592 -> 583 -Levels should correct with IV fluids alone and now Trending down -Recheck CMP AM  Hypoalbuminemia -Acute.  Patient's initial albumin  noted to be 2.7 on admission; Albumin now 2.0 -Checked prealbumin this AM and was 16.3 -Dietician/Nutritionist Consult appreciated  Hyperlipidemia  -Continue to Hold statin   Hyperphosphatemia -In the setting of Acute Renal Failure -Phos Level went from 5.2 -> 4.9 -Continue to Monitor Phos Levels and repeat in AM   Generalized Weakness -PT/OT Evaluation recommending SNF  Essential Hypertension -Home BP medications currently being Held (ACE-I) -C/w Metoprolol 50 mg po BID -Nephrology recommending beginning Procardia XL qHS if remains elevated  Moderate Malnutrition in the Context of Chronic Illness -Nutritionist consulted and appreciate Evaluation and recommendations -C/w Ensure Enlive 237 mL po BID  DVT prophylaxis: SCDs Code Status: FULL CODE Family Communication: No family present at bedside but spoke to [employer] over the phone  Disposition Plan: Remain Inpateint for Continued Workup and Treatment; PT Evaluation  Consultants:   Nephrology  Gastroenterology    Procedures:  Bilateral Lower Extremity Duplex Negative for obvious evidence of DVT.  ECHOCARDIOGRAM 07/12/17 ------------------------------------------------------------------- Study Conclusions  - Left ventricle: The cavity size was normal. There was mild   concentric hypertrophy. Systolic function was normal. The   estimated ejection fraction was in the range of 60% to 65%. Wall   motion was normal; there were no regional wall motion   abnormalities. Doppler parameters are consistent with abnormal   left ventricular relaxation (grade 1 diastolic dysfunction). - Mitral valve: Calcified annulus. - Atrial septum: No defect or patent foramen ovale was identified.  Antimicrobials:  Anti-infectives (From admission, onward)   None     Subjective: Seen and examined at bedside and was again on the phone. This time she was a little more interactive with me today. Had no complaints but understood  her kidneys were not doing as well. No CP or SOB.   Objective: Vitals:   07/13/17 0500 07/13/17 0830 07/13/17 0932 07/13/17 1420  BP: (!) 141/70  136/62 135/64  Pulse: 75  81 87  Resp: 18     Temp: 98.2 F (36.8 C)   98.6 F (37 C)  TempSrc: Oral   Oral  SpO2: 95% 93%  95%  Weight: 65.3 kg (144 lb)     Height:        Intake/Output Summary (Last 24 hours) at 07/13/2017 1537 Last data filed at 07/13/2017 1344 Gross per 24 hour  Intake 1619.58 ml  Output 1050 ml  Net 569.58 ml   Filed Weights   07/11/17 2011 07/12/17 0500 07/13/17 0500  Weight: 73.8 kg (162 lb 11.2 oz) 68.5 kg (151 lb) 65.3 kg (144 lb)   Examination: Physical Exam:  Constitutional: AAF who is awake and again talking on the phone. In NAD.  Eyes: Sclerae Anicteric; Lids Normal ENMT: External Ears and Nose appear normal. Poor Dentition  Neck: Supple with no JVD Respiratory: Diminished to auscultation; No wheezing/rales/rhonchi. Unlabored breathing and no accessory muscle use Cardiovascular: RRR; S1/S2; Mild LE edema today  Abdomen: Soft, Mildly Tender; Slightly distended body habitus. Bowel sounds present  GU: Deferred; Foley Catheter in place with [employer] Urine Musculoskeletal: No contractures; No cyanosis Skin: Warm and Dry; No appreciable rashes noted Neurologic: CN 2-12 grossly intact. No appreciable focal deficits Psychiatric: Awake and alert and again talking on the phone. Normal mood and affect.   Data Reviewed: I have personally reviewed following labs and imaging studies  CBC: Recent Labs  Lab 07/10/17 1906 07/11/17 0527 07/12/17 0528 07/13/17 0549  WBC 9.0 9.9 9.5 10.1  NEUTROABS  --  8.0* 8.0* 8.4*  HGB 9.7* 9.1* 9.3* 9.2*  HCT 30.0* 27.8* 28.0* 26.9*  MCV 98.4 97.5 96.2 93.7  PLT 335 316 310 960   Basic Metabolic Panel: Recent Labs  Lab 07/10/17 1906 07/11/17 0527 07/12/17 0528 07/13/17 0549  NA 141 138 141 140  K 2.8* 3.2* 3.2* 3.2*  CL 107 109 110 104  CO2 19* 17* 20* 23   GLUCOSE 112* 79 111* 123*  BUN 43* 47* 56* 62*  CREATININE 3.82* 3.92* 4.51* 4.65*  CALCIUM 8.3* 7.6* 8.1* 8.1*  MG 2.3  --  2.1 2.1  PHOS  --   --  5.2* 4.9*   GFR: Estimated Creatinine Clearance: 8.7 mL/min (A) (by C-G formula based on SCr of 4.65 mg/dL (H)). Liver Function Tests: Recent Labs  Lab 07/10/17 2236 07/11/17 0527 07/12/17 0528 07/13/17 0549  AST 1,239* 992* 622* 529*  ALT 843* 740* 592* 583*  ALKPHOS 102 93 93 91  BILITOT 0.8 0.7 0.5 0.5  PROT 5.9* 5.1* 4.9* 4.7*  ALBUMIN 2.7* 2.3* 2.1* 2.0*   No results for input(s): LIPASE, AMYLASE in the last 168 hours. No results for input(s): AMMONIA in the last 168 hours. Coagulation Profile: No results for input(s): INR, PROTIME in the last 168 hours. Cardiac Enzymes: Recent Labs  Lab 07/10/17 1906 07/11/17 0527 07/12/17 0528 07/13/17 0549  CKTOTAL 40,213* 26,183* 18,056* 11,328*   BNP (last 3 results) No results for input(s): PROBNP in the last 8760 hours. HbA1C: No results for  input(s): HGBA1C in the last 72 hours. CBG: No results for input(s): GLUCAP in the last 168 hours. Lipid Profile: No results for input(s): CHOL, HDL, LDLCALC, TRIG, CHOLHDL, LDLDIRECT in the last 72 hours. Thyroid Function Tests: Recent Labs    07/10/17 1914  TSH 0.926   Anemia Panel: Recent Labs    07/13/17 0549  FERRITIN 121  TIBC 207*  IRON 69   Sepsis Labs: No results for input(s): PROCALCITON, LATICACIDVEN in the last 168 hours.  No results found for this or any previous visit (from the past 240 hour(s)).   Radiology Studies: No results found. Scheduled Meds: . feeding supplement (ENSURE ENLIVE)  237 mL Oral BID BM  . gabapentin  100 mg Oral QHS  . metoprolol tartrate  50 mg Oral BID WC  . mometasone-formoterol  2 puff Inhalation BID  . pantoprazole (PROTONIX) IV  40 mg Intravenous Q12H  . potassium chloride  20 mEq Oral BID   Continuous Infusions: .  sodium bicarbonate  infusion 1000 mL 150 mL/hr at  07/13/17 0945    LOS: 2 days   Kerney Elbe, DO [facility] Pager [phone]  If 7PM-7AM, please contact night-coverage [email or web address] Password [facility] 07/13/2017, 3:37 PM

## 2017-07-13 NOTE — Progress Notes (Signed)
Patient ID: Sara Fox, female   DOB: 12/28/1940, 77 y.o.   MRN: [id number] [facility] Progress Note   Assessment/ Plan:   1. Acute kidney injury: Suspect that this is possibly multifactorial from rhabdomyolysis and possibly ATN.  Urine output has improved overnight and only slight rise of creatinine noted reflecting that she might be approaching the plateau phase of ATN.  I will augment her urine output today with furosemide (given the suspicion of pigment associated nephropathy, permissive to use both diuretics and fluids at the same time). 2. Rhabdomyolysis: Suspected to be secondary to recent falls/bedridden state and ongoing statin use. CPK levels continue to improve with intravenous fluids . 3. Anemia: Seen by gastroenterology and their plans for expectant management noted. 4. Hypertension: Blood pressure is marginally elevated, will begin daily at bedtime Procardia XL if pressures remain elevated. 5. Hypokalemia: Secondary to losses from diarrhea and shifts from isotonic sodium bicarbonate drip, continue efforts at cautious replacement via oral route.  Subjective:   Reports to be feeling fair-denies any chest pain or shortness of breath   Objective:   BP 136/62   Pulse 81   Temp 98.2 F (36.8 C) (Oral)   Resp 18   Ht 5\' 4"  (1.626 m)   Wt 65.3 kg (144 lb)   SpO2 93%   BMI 24.72 kg/m   Intake/Output Summary (Last 24 hours) at 07/13/2017 1412 Last data filed at 07/13/2017 1344 Gross per 24 hour  Intake 1619.58 ml  Output 1051 ml  Net 568.58 ml   Weight change: -8.482 kg (-11.2 oz)  Physical Exam: Gen: Comfortably resting flat in bed CVS: Pulse regular rhythm, normal rate, S1 and S2 normal Resp: Coarse/transmitted breath sounds bilaterally without distinct rales or rhonchi Abd: Soft, flat, nontender Ext: No lower extremity edema  Imaging: No results found.  Labs: BMET Recent Labs  Lab 07/10/17 1906 07/11/17 0527 07/12/17 0528 07/13/17 0549  NA 141  138 141 140  K 2.8* 3.2* 3.2* 3.2*  CL 107 109 110 104  CO2 19* 17* 20* 23  GLUCOSE 112* 79 111* 123*  BUN 43* 47* 56* 62*  CREATININE 3.82* 3.92* 4.51* 4.65*  CALCIUM 8.3* 7.6* 8.1* 8.1*  PHOS  --   --  5.2* 4.9*   CBC Recent Labs  Lab 07/10/17 1906 07/11/17 0527 07/12/17 0528 07/13/17 0549  WBC 9.0 9.9 9.5 10.1  NEUTROABS  --  8.0* 8.0* 8.4*  HGB 9.7* 9.1* 9.3* 9.2*  HCT 30.0* 27.8* 28.0* 26.9*  MCV 98.4 97.5 96.2 93.7  PLT 335 316 310 279    Medications:    . feeding supplement (ENSURE ENLIVE)  237 mL Oral BID BM  . gabapentin  100 mg Oral QHS  . metoprolol tartrate  50 mg Oral BID WC  . mometasone-formoterol  2 puff Inhalation BID  . pantoprazole (PROTONIX) IV  40 mg Intravenous Q12H   Elmarie Shiley, MD 07/13/2017, 2:12 PM

## 2017-07-13 NOTE — [place] FL2 (Signed)
[place] MEDICAID FL2 LEVEL OF CARE SCREENING TOOL     IDENTIFICATION  Patient Name: Sara Fox Birthdate: 01-Sep-1940 Sex: female Admission Date (Current Location): 07/10/2017  [facility] [place] Number:  Herbalist and Address:  [facility],  [place], [place]      Provider Number: [id number]  Attending Physician Name and Address:  Kerney Elbe, DO  Relative Name and Phone Number:       Current Level of Care: Hospital Recommended Level of Care: [place] Prior Approval Number:    Date Approved/Denied: 07/13/17 PASRR Number: [id number]  Discharge Plan: SNF    Current Diagnoses: Patient Active Problem List   Diagnosis Date Noted  . Malnutrition of moderate degree 07/12/2017  . ARF (acute renal failure) ([place]) 07/11/2017  . Rhabdomyolysis 07/11/2017  . Melena 07/11/2017  . Hypoalbuminemia 07/11/2017  . Elevated LFTs 07/11/2017  . Shortness of breath at rest 12/21/2016  . Snoring 12/21/2016  . OSA and COPD overlap syndrome ([place]) 12/21/2016  . Bilateral lower extremity edema 12/21/2016  . Gait disorder 02/05/2015  . Peripheral musculoskeletal gait disorder 02/05/2015  . Lumbar post-laminectomy syndrome 02/05/2015  . Coronary artery disease involving native coronary artery of native heart without angina pectoris 10/16/2014  . NSTEMI (non-ST elevated myocardial infarction) ([place]) 09/13/2014  . Cardiomyopathy, ischemic 09/13/2014  . Chronic systolic heart failure ([place])   . Chest pain 09/12/2014  . Tobacco abuse 09/12/2014  . Dyslipidemia 09/12/2014  . CKD (chronic kidney disease), stage II 09/12/2014  . Normocytic anemia 09/12/2014  . Chronic diastolic heart failure, NYHA class 1 ([place]) 09/12/2014  . Sacroiliac dysfunction 10/10/2013  . Postlaminectomy syndrome, cervical region 08/01/2013  . Chronic midline low back pain with left-sided sciatica 08/01/2013  . Shoulder joint contracture 08/01/2013  .  HTN (hypertension) 03/03/2013  . Abnormal genetic test 03/03/2013    Orientation RESPIRATION BLADDER Height & Weight     Self  Normal Indwelling catheter Weight: 144 lb (65.3 kg) Height:  5\' 4"  (162.6 cm)  BEHAVIORAL SYMPTOMS/MOOD NEUROLOGICAL BOWEL NUTRITION STATUS      Continent Diet(see dc summary)  AMBULATORY STATUS COMMUNICATION OF NEEDS Skin   Extensive Assist Verbally Normal                       Personal Care Assistance Level of Assistance  Bathing, Feeding, Dressing Bathing Assistance: Limited assistance Feeding assistance: Independent Dressing Assistance: Limited assistance     Functional Limitations Info  Sight, Hearing, Speech Sight Info: Adequate Hearing Info: Adequate Speech Info: Adequate    SPECIAL CARE FACTORS FREQUENCY  PT (By licensed PT), OT (By licensed OT)     PT Frequency: 5x/week OT Frequency: 5x/week            Contractures Contractures Info: Not present    Additional Factors Info  Allergies, Code Status Code Status Info: Full Allergies Info: Buprenorphine Hcl, Celebrex Celecoxib, Morphine And Related, Codeine, Vioxx Rofecoxib           Current Medications (07/13/2017):  This is the current hospital active medication list Current Facility-Administered Medications  Medication Dose Route Frequency Provider Last Rate Last Dose  . acetaminophen (TYLENOL) tablet 650 mg  650 mg Oral Q6H PRN Fuller Plan A, MD       Or  . acetaminophen (TYLENOL) suppository 650 mg  650 mg Rectal Q6H PRN Striplin, Rondell A, MD      . albuterol (PROVENTIL) (2.5 MG/3ML) 0.083% nebulizer solution 2.5  mg  2.5 mg Nebulization Q6H PRN Fuller Plan A, MD      . feeding supplement (ENSURE ENLIVE) (ENSURE ENLIVE) liquid 237 mL  237 mL Oral BID BM Sheikh, Omair Latif, DO   237 mL at 07/13/17 0932  . gabapentin (NEURONTIN) capsule 100 mg  100 mg Oral QHS Defilippo, Rondell A, MD   100 mg at 07/12/17 2110  . metoprolol tartrate (LOPRESSOR) tablet 50 mg  50 mg Oral BID  WC Pask, Rondell A, MD   50 mg at 07/13/17 0932  . mometasone-formoterol (DULERA) 200-5 MCG/ACT inhaler 2 puff  2 puff Inhalation BID Fuller Plan A, MD   2 puff at 07/13/17 0830  . ondansetron (ZOFRAN) tablet 4 mg  4 mg Oral Q6H PRN Fuller Plan A, MD       Or  . ondansetron (ZOFRAN) injection 4 mg  4 mg Intravenous Q6H PRN Twiggs, Rondell A, MD      . pantoprazole (PROTONIX) injection 40 mg  40 mg Intravenous Q12H Horn, Rondell A, MD   40 mg at 07/13/17 0938  . sodium bicarbonate 150 mEq in dextrose 5 % 1,000 mL infusion   Intravenous Continuous Elmarie Shiley, MD 150 mL/hr at 07/13/17 0945    . traMADol (ULTRAM) tablet 50-100 mg  50-100 mg Oral Q6H PRN Norval Morton, MD         Discharge Medications: Please see discharge summary for a list of discharge medications.  Relevant Imaging Results:  Relevant Lab Results:   Additional Information ssn: [id number]  Servando Snare, LCSW

## 2017-07-13 NOTE — Progress Notes (Signed)
[name] 12:23 PM  Subjective: Patient without any GI complaints and no signs of bleeding eating comfortably in the bed  Objective: Vital signs stable afebrile patient not examined today looks unchanged if not improved hemoglobin stable BUN and creatinine are little worse transaminases a little better  Assessment: Rhabdo and multiple medical problems  Plan: Please call my partner this weekend if any question or problem otherwise will last hospital doctor next week to check on her and see if endoscopy is needed electively next week  [facility] E  Pager [phone] After 5PM or if no answer call [phone]

## 2017-07-14 LAB — COMPREHENSIVE METABOLIC PANEL
ALT: 597 U/L — ABNORMAL HIGH (ref 14–54)
AST: 525 U/L — ABNORMAL HIGH (ref 15–41)
Albumin: 2 g/dL — ABNORMAL LOW (ref 3.5–5.0)
Alkaline Phosphatase: 91 U/L (ref 38–126)
Anion gap: 13 (ref 5–15)
BUN: 69 mg/dL — ABNORMAL HIGH (ref 6–20)
CO2: 32 mmol/L (ref 22–32)
Calcium: 8.5 mg/dL — ABNORMAL LOW (ref 8.9–10.3)
Chloride: 95 mmol/L — ABNORMAL LOW (ref 101–111)
Creatinine, Ser: 4.62 mg/dL — ABNORMAL HIGH (ref 0.44–1.00)
GFR calc Af Amer: 10 mL/min — ABNORMAL LOW (ref >60)
GFR calc non Af Amer: 8 mL/min — ABNORMAL LOW (ref >60)
Glucose, Bld: 117 mg/dL — ABNORMAL HIGH (ref 65–99)
Potassium: 3.1 mmol/L — ABNORMAL LOW (ref 3.5–5.1)
Sodium: 140 mmol/L (ref 135–145)
Total Bilirubin: 0.5 mg/dL (ref 0.3–1.2)
Total Protein: 4.7 g/dL — ABNORMAL LOW (ref 6.5–8.1)

## 2017-07-14 LAB — CBC WITH DIFFERENTIAL/PLATELET
Basophils Absolute: 0 10*3/uL (ref 0.0–0.1)
Basophils Relative: 0 %
Eosinophils Absolute: 0.1 10*3/uL (ref 0.0–0.7)
Eosinophils Relative: 1 %
HCT: 26.7 % — ABNORMAL LOW (ref 36.0–46.0)
Hemoglobin: 9 g/dL — ABNORMAL LOW (ref 12.0–15.0)
Lymphocytes Relative: 10 %
Lymphs Abs: 1 10*3/uL (ref 0.7–4.0)
MCH: 31.4 pg (ref 26.0–34.0)
MCHC: 33.7 g/dL (ref 30.0–36.0)
MCV: 93 fL (ref 78.0–100.0)
Monocytes Absolute: 0.8 10*3/uL (ref 0.1–1.0)
Monocytes Relative: 7 %
Neutro Abs: 8.4 10*3/uL — ABNORMAL HIGH (ref 1.7–7.7)
Neutrophils Relative %: 82 %
Platelets: 308 10*3/uL (ref 150–400)
RBC: 2.87 MIL/uL — ABNORMAL LOW (ref 3.87–5.11)
RDW: 14.9 % (ref 11.5–15.5)
WBC: 10.3 10*3/uL (ref 4.0–10.5)

## 2017-07-14 LAB — MPO/PR-3 (ANCA) ANTIBODIES
ANCA Proteinase 3: <3.5 U/mL (ref 0.0–3.5)
Myeloperoxidase Abs: <9 U/mL (ref 0.0–9.0)

## 2017-07-14 LAB — PHOSPHORUS: Phosphorus: 4.7 mg/dL — ABNORMAL HIGH (ref 2.5–4.6)

## 2017-07-14 LAB — MAGNESIUM: Magnesium: 2.1 mg/dL (ref 1.7–2.4)

## 2017-07-14 LAB — CK: Total CK: 12940 U/L — ABNORMAL HIGH (ref 38–234)

## 2017-07-14 MED ORDER — POTASSIUM CHLORIDE CRYS ER 20 MEQ PO TBCR
20.0000 meq | EXTENDED_RELEASE_TABLET | Freq: Two times a day (BID) | ORAL | Status: AC
  Administered 2017-07-14 (×2): 20 meq via ORAL
  Filled 2017-07-14 (×2): qty 1

## 2017-07-14 MED ORDER — FUROSEMIDE 10 MG/ML IJ SOLN
40.0000 mg | Freq: Once | INTRAMUSCULAR | Status: AC
  Administered 2017-07-14: 40 mg via INTRAVENOUS
  Filled 2017-07-14: qty 4

## 2017-07-14 NOTE — Progress Notes (Signed)
Subjective: The patient was seen and examined at bedside. Reports that she is able to tolerate her diet and denies abdominal pain.  Objective: Vital signs in last 24 hours: Temp:  [98.5 F (36.9 C)-98.9 F (37.2 C)] 98.9 F (37.2 C) (03/02 0630) Pulse Rate:  [79-87] 84 (03/02 0630) Resp:  [18-20] 20 (03/02 0630) BP: (135-141)/(64-79) 139/64 (03/02 0630) SpO2:  [93 %-96 %] 94 % (03/02 0836) Weight change:  Last BM Date: 07/12/17  PE: Not in acute distress GENERAL: No icterus, mild pallor ABDOMEN: Surgical scar appears healthy, soft, nondistended, nontender EXTREMITIES: No deformity  Lab Results: Results for orders placed or performed during the hospital encounter of 07/10/17 (from the past 48 hour(s))  Iron and TIBC     Status: Abnormal   Collection Time: 07/13/17  5:49 AM  Result Value Ref Range   Iron 69 28 - 170 ug/dL   TIBC 207 (L) 250 - 450 ug/dL   Saturation Ratios 33 (H) 10.4 - 31.8 %   UIBC 138 ug/dL    Comment: Performed at [facility], [address] [place], [place] [address]  Ferritin     Status: None   Collection Time: 07/13/17  5:49 AM  Result Value Ref Range   Ferritin 121 11 - 307 ng/mL    Comment: Performed at [facility], [place] [address] [place], [place] [address]  CK     Status: Abnormal   Collection Time: 07/13/17  5:49 AM  Result Value Ref Range   Total CK 11,328 (H) 38 - 234 U/L    Comment: RESULTS CONFIRMED BY MANUAL DILUTION Performed at [facility], [place] [address], [place], [place] [address]   Magnesium     Status: None   Collection Time: 07/13/17  5:49 AM  Result Value Ref Range   Magnesium 2.1 1.7 - 2.4 mg/dL    Comment: Performed at [facility], [place] [address], [place], [place] [address]  CBC with Differential/Platelet     Status: Abnormal   Collection Time: 07/13/17  5:49 AM  Result Value Ref Range   WBC 10.1 4.0 - 10.5 K/uL   RBC 2.87 (L) 3.87 - 5.11 MIL/uL   Hemoglobin 9.2 (L)  12.0 - 15.0 g/dL   HCT 26.9 (L) 36.0 - 46.0 %   MCV 93.7 78.0 - 100.0 fL   MCH 32.1 26.0 - 34.0 pg   MCHC 34.2 30.0 - 36.0 g/dL   RDW 14.9 11.5 - 15.5 %   Platelets 279 150 - 400 K/uL   Neutrophils Relative % 83 %   Neutro Abs 8.4 (H) 1.7 - 7.7 K/uL   Lymphocytes Relative 9 %   Lymphs Abs 0.9 0.7 - 4.0 K/uL   Monocytes Relative 8 %   Monocytes Absolute 0.8 0.1 - 1.0 K/uL   Eosinophils Relative 0 %   Eosinophils Absolute 0.0 0.0 - 0.7 K/uL   Basophils Relative 0 %   Basophils Absolute 0.0 0.0 - 0.1 K/uL    Comment: Performed at [facility], [place] [address] [place], [place] [address]  Phosphorus     Status: Abnormal   Collection Time: 07/13/17  5:49 AM  Result Value Ref Range   Phosphorus 4.9 (H) 2.5 - 4.6 mg/dL    Comment: Performed at [facility], [place] [address], [place],  [address]  Comprehensive metabolic panel     Status: Abnormal   Collection Time: 07/13/17  5:49 AM  Result Value  Ref Range   Sodium 140 135 - 145 mmol/L   Potassium 3.2 (L) 3.5 - 5.1 mmol/L   Chloride 104 101 - 111 mmol/L   CO2 23 22 - 32 mmol/L   Glucose, Bld 123 (H) 65 - 99 mg/dL   BUN 62 (H) 6 - 20 mg/dL   Creatinine, Ser 4.65 (H) 0.44 - 1.00 mg/dL   Calcium 8.1 (L) 8.9 - 10.3 mg/dL   Total Protein 4.7 (L) 6.5 - 8.1 g/dL   Albumin 2.0 (L) 3.5 - 5.0 g/dL   AST 529 (H) 15 - 41 U/L   ALT 583 (H) 14 - 54 U/L   Alkaline Phosphatase 91 38 - 126 U/L   Total Bilirubin 0.5 0.3 - 1.2 mg/dL   GFR calc non Af Amer 8 (L) >60 mL/min   GFR calc Af Amer 10 (L) >60 mL/min    Comment: (NOTE) The eGFR has been calculated using the CKD EPI equation. This calculation has not been validated in all clinical situations. eGFR's persistently <60 mL/min signify possible Chronic Kidney Disease.    Anion gap 13 5 - 15    Comment: Performed at [facility], [place] [address] [place], [place] [address]  CK     Status: Abnormal   Collection Time: 07/14/17  5:33  AM  Result Value Ref Range   Total CK 12,940 (H) 38 - 234 U/L    Comment: RESULTS CONFIRMED BY MANUAL DILUTION Performed at [facility], [place] [address], [place], [place] [address]   Magnesium     Status: None   Collection Time: 07/14/17  5:33 AM  Result Value Ref Range   Magnesium 2.1 1.7 - 2.4 mg/dL    Comment: Performed at [facility], [place] [address] [place], [place] [address]  Comprehensive metabolic panel     Status: Abnormal   Collection Time: 07/14/17  5:33 AM  Result Value Ref Range   Sodium 140 135 - 145 mmol/L   Potassium 3.1 (L) 3.5 - 5.1 mmol/L   Chloride 95 (L) 101 - 111 mmol/L   CO2 32 22 - 32 mmol/L   Glucose, Bld 117 (H) 65 - 99 mg/dL   BUN 69 (H) 6 - 20 mg/dL   Creatinine, Ser 4.62 (H) 0.44 - 1.00 mg/dL   Calcium 8.5 (L) 8.9 - 10.3 mg/dL   Total Protein 4.7 (L) 6.5 - 8.1 g/dL   Albumin 2.0 (L) 3.5 - 5.0 g/dL   AST 525 (H) 15 - 41 U/L   ALT 597 (H) 14 - 54 U/L   Alkaline Phosphatase 91 38 - 126 U/L   Total Bilirubin 0.5 0.3 - 1.2 mg/dL   GFR calc non Af Amer 8 (L) >60 mL/min   GFR calc Af Amer 10 (L) >60 mL/min    Comment: (NOTE) The eGFR has been calculated using the CKD EPI equation. This calculation has not been validated in all clinical situations. eGFR's persistently <60 mL/min signify possible Chronic Kidney Disease.    Anion gap 13 5 - 15    Comment: Performed at [facility], [place] [address], [place],  [address]  CBC with Differential/Platelet     Status: Abnormal   Collection Time: 07/14/17  5:33 AM  Result Value Ref Range   WBC 10.3 4.0 - 10.5 K/uL   RBC 2.87 (L) 3.87 - 5.11 MIL/uL   Hemoglobin 9.0 (L) 12.0 - 15.0 g/dL   HCT 26.7 (L) 36.0 - 46.0 %  MCV 93.0 78.0 - 100.0 fL   MCH 31.4 26.0 - 34.0 pg   MCHC 33.7 30.0 - 36.0 g/dL   RDW 14.9 11.5 - 15.5 %   Platelets 308 150 - 400 K/uL   Neutrophils Relative % 82 %   Neutro Abs 8.4 (H) 1.7 - 7.7 K/uL   Lymphocytes Relative 10 %    Lymphs Abs 1.0 0.7 - 4.0 K/uL   Monocytes Relative 7 %   Monocytes Absolute 0.8 0.1 - 1.0 K/uL   Eosinophils Relative 1 %   Eosinophils Absolute 0.1 0.0 - 0.7 K/uL   Basophils Relative 0 %   Basophils Absolute 0.0 0.0 - 0.1 K/uL    Comment: Performed at [facility], [place] [address], [place], [place] [address]  Phosphorus     Status: Abnormal   Collection Time: 07/14/17  5:33 AM  Result Value Ref Range   Phosphorus 4.7 (H) 2.5 - 4.6 mg/dL    Comment: Performed at [facility], [place] [address], [place], [place] [address]    Studies/Results: No results found.  Medications: I have reviewed the patient's current medications.  Assessment: 1. Rhabdomyolysis([place] of 11155).  2. Renal impairment BUN/Cr/GFr of 69/4.62/10  3. Anemia( Hb 9.3/9.3/9, MCV 93), FOBT is positive. 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  4. Elevated liver enzymes(TB/ASt/ALT/ALp 0.5/525/597/91), hepatocellular dysfuntion likely secondary to rhabdomyolysis, unremarkable Iron panel. USg from 07/11/17  Showed CBD 7-8 mm, prior cholecystectomy, heterogenous liver, patent portal vein.  Plan: Plan elective endoscopy in the next week, if there is improvement in patients clinical condition and is able to tolerate anesthesia. Hemodynamically stable, no further drop in hemoglobin. Continue pantoprazole 40 mg daily for now.   Ronnette Juniper 07/14/2017, 9:40 AM   Pager [phone] If no answer or after 5 PM call [phone]

## 2017-07-14 NOTE — Progress Notes (Signed)
PROGRESS NOTE    Sara Fox  VQM:[id number] DOB: 08/23/1940 DOA: 07/10/2017 PCP: Sara Chard, MD   Brief Narrative:  Sara Fox is a 77 y.o. female with medical history significant of HTN, HLD, systolic CHF last EF 93-26% with grade 1 dFx, CAD; who presents with weakness and confusion over the last 1 week.  Patient reports having diarrhea going multiple times per day with dark stools.  Last bowel movement noted yesterday night.  Family reported patient having intermittent confusion, with increased lethargy, and weakness.  Associated symptoms include complaints of right leg pain, lower abdominal pain, dysuria, and lower extremity swelling. Patient has been more sedimentary.  Last month she has suffered a left wrist fracture.  Denies any recent fever, shortness of breath, antibiotics, travel, or falls.  She was seen by her PCP today and blood work was obtained.  Patient was called back and advised to come in to the emergency department for further evaluation due to abnormal labs.  Upon admission into the emergency department patient was noted to be afebrile with vital signs relatively within normal limits. She was found to have Acute Renal Failure and also found to have Rhabdomyolysis. Nephrology Consulted and managing Fluids and now have stopped. She also has had Melena but Hb/Hct has been Stable. GI has been consulted and recommending continue to observe and and elective Endoscopy this coming week if Patient's clinical condition improves.   Assessment & Plan:   Principal Problem:   ARF (acute renal failure) (HCC) Active Problems:   Normocytic anemia   Bilateral lower extremity edema   Rhabdomyolysis   Melena   Hypoalbuminemia   Elevated LFTs   Malnutrition of moderate degree  Acute Kidney Injury/Acute Renal Failure likely multifactorial in the setting of Rhabdomyolysis and Likely ATN -Acute. Patient's baseline creatinine previously noted to be within normal limits, but she  presented with a BUN of 43 and creatinine 3.82.  - Urinalysis was positive for large hemoglobin but negative for RBCs given concern for rhabdomyolysis.   -Given patient taking diuretics suspect prerenal cause of symptoms and lack of mobility . -Admitted to telemetry bed  -Strict intake and output; Patient is +6.361 Liters  -Isotonic Sodium Bicarbonate at a rate of 150 mL/hr now stopped by Renal  -Checked Abdominal ultrasound and showed no acute findings but mildly increased echotexture in the kidnesy bilaterally suggesting early chronic medical renal disease -Hold Nephrotoxic Agents -BUN/Cr plateaued to 69/4.62 -Urinalysis had 100 Protein  -Nephrology consulted and appreciate further evaluation and recommendations -Nephrology checking serologies for Acute Glomerulonephritis and checking ANA, IFA, MPO/Pr3 (ANCA) -Nephrology recommending Volume expansion to force diuresis and recommending using IV Furosemide as needed to help augment Urine Output -Nephrology giving IV Lasix 40 mg x1 again today and recommending continuing the use of intermittent dosing of diuretics  -UOP improved some so Nephrology stopping IVF given her overall fluid balance  Acute Rhabdomyolysis -Acute. CK elevated at 40,213 on admission and now improved to 12,940 -IVF with Isotonic Bicarbonate to 150 mL/hr now D/C'd  -Check daily CPK   Melena -Acute.  Patient reports having dark diarrhea over the last 1-2 weeks; No episodes here -Stool guaiacs were ordered and noted to be weakly positive.   -No reported recent antibiotic use but has been taking Daily Ibuprofen.  Question possibility of upper GI bleed given elevated BUN. -Type and screen for possible need of blood products -Monitor H&H; Hb/Hct went from 9.7/30.0 -> 9.1/27.8 -> 9.3/28.0 -> 9.2/26.9 -Changed Protonix 40mg  IV BID to  40 mg po Daily per GI recc's yesterday  -Gastroenterology Dr. Watt Climes consulted for further evaluation and recommendations; GI observing for now  and will re-evaluate need for Endoscopy early next week   Normocytic Normochromic Anemia -Hemoglobin noted to be 9.7 on admission. Baseline hemoglobin previously noted to be around 10-11. -Hb/Hct went from 9.7/30.0 -> 9.1/27.8 -> 9.3/28.0 -> 9.2/26.9 -> 9.0/26.7 -Anemia Panel showed Iron Level of 69, UBIC of 138, TIBC of 207, Saturation Ratio of 33, and Ferritin of 121 -Continue to Monitor for S/Sx of Bleeding -Repeat CBC in AM    Acute Encephalopathy, improving -Suspected secondary to dehydration. -Neurochecks -If continues to persist will get Head CT w/o Contrast but will hold off for now  Hypokalemia -Acute.  Initial potassium noted to be 2.8 on admission and now improved to 3.1.  -Patient has been taking furosemide at home given 10 mEq of potassium chloride IV in ED. -K+ Being replete by Nephrology with 20 mEQ po BID x 2 doses again  -Continue to monitor and Replete as needed -Repeat CMP in AM   History of Combined Systolic and Diastolic CHF -Last EF noted to be around 45-50% with grade 1 diastolic dysfunction in 4481. -Hold furosemide and lisinopril due to acute renal failure -Continue Metoprolol 50 mg po BIDas tolerated -Strict I's/O's, Daily Weights -Repeat ECHOCardiogram done this visit showed EF of 60-65% and Grade 1 DD -Placed Foley for accurate Monitoring and patient is +6.361 Liters  -Weight is down 18 lbs and was not done today   Lower Extremity Edema, improved -Patient with at least 2+ pitting lower extremity edema on Admission, but now it's difficult to tell LE swelling given SCD's   -Symptoms likely could be secondary to hypoalbuminemia vs. Heart Failure  -Checked Doppler duplex ultrasound and was Negative for DVT -Continue to Monitor   CAD -Status post DES stent placement and 09/2014. -Currently Holding Brilinta and Aspirin  Abnormal/Elevated LFTs, improving -Acute.   -Patient with significantly elevated AST and ALT which correlates with rhabdomyolysis;   -AST went from 1239 -> 992 -> 622 -> 529 -> 525 -ALT went from 843 -> 740 -> 592 -> 583 -> 597 -Levels should correct with IV fluids alone and now Trending down -Recheck CMP AM  Hypoalbuminemia -Acute.  Patient's initial albumin noted to be 2.7 on admission; Albumin now 2.0 -Checked prealbumin this AM and was 16.3 -Dietician/Nutritionist Consult appreciated  Hyperlipidemia  -Continue to Hold Statin   Hyperphosphatemia -In the setting of Acute Renal Failure -Phos Level went from 5.2 -> 4.9 -> 4.7 -Continue to Monitor Phos Levels and repeat in AM   Generalized Weakness -PT/OT Evaluation recommending SNF  Essential Hypertension -Home BP medications currently being Held (ACE-I) -C/w Metoprolol 50 mg po BID -Nephrology recommending beginning Procardia XL qHS if remains elevated -BP was 154/70  Moderate Malnutrition in the Context of Chronic Illness -Nutritionist consulted and appreciate Evaluation and recommendations -C/w Ensure Enlive 237 mL po BID  DVT prophylaxis: SCDs Code Status: FULL CODE Family Communication: No family present at bedside but spoke to [employer] over the phone  Disposition Plan: Remain Inpateint for Continued Workup and Treatment; PT Evaluation recommending SNF; Possible EGD this week  Consultants:   Nephrology  Gastroenterology    Procedures:  Bilateral Lower Extremity Duplex Negative for obvious evidence of DVT.  ECHOCARDIOGRAM 07/12/17 ------------------------------------------------------------------- Study Conclusions  - Left ventricle: The cavity size was normal. There was mild   concentric hypertrophy. Systolic function was normal. The   estimated ejection fraction  was in the range of 60% to 65%. Wall   motion was normal; there were no regional wall motion   abnormalities. Doppler parameters are consistent with abnormal   left ventricular relaxation (grade 1 diastolic dysfunction). - Mitral valve: Calcified annulus. - Atrial  septum: No defect or patent foramen ovale was identified.   Antimicrobials:  Anti-infectives (From admission, onward)   None     Subjective: Seen and examined at bedside and felt better. Had no complaints and was eating lunch. No CP or SOB.   Objective: Vitals:   07/13/17 2120 07/14/17 0630 07/14/17 0836 07/14/17 1310  BP: 136/67 139/64  134/60  Pulse: 81 84  83  Resp: 18 20  18   Temp: 98.5 F (36.9 C) 98.9 F (37.2 C)  98.7 F (37.1 C)  TempSrc: Oral Oral  Oral  SpO2: 96% 96% 94% 95%  Weight:      Height:        Intake/Output Summary (Last 24 hours) at 07/14/2017 1639 Last data filed at 07/14/2017 1223 Gross per 24 hour  Intake 1020 ml  Output 2150 ml  Net -1130 ml   Filed Weights   07/11/17 2011 07/12/17 0500 07/13/17 0500  Weight: 73.8 kg (162 lb 11.2 oz) 68.5 kg (151 lb) 65.3 kg (144 lb)   Examination: Physical Exam:  Constitutional: AAF who is in NAD sitting up eating Lunch Eyes: Sclerae Anicteric. Lids normal ENMT: External Ears and Nose appear normal.  Neck: Supple with no JVD Respiratory: Diminished but unlabored breathing. No appreciable wheezing/rales/rhonchi Cardiovascular: RRR; Has some edema Abdomen: Soft, NT, ND. Bowel sounds present GU: Deferred; Foley Catheter  Musculoskeletal: No contractures; no cyanosis Skin: Warm and Dry; No appreciable rashes noted. Has thick nails in feet Neurologic: CN 2-12 grossly intact. No appreciable focal defitis Psychiatric: Awake and Alert. Normal mood and affect  Data Reviewed: I have personally reviewed following labs and imaging studies  CBC: Recent Labs  Lab 07/10/17 1906 07/11/17 0527 07/12/17 0528 07/13/17 0549 07/14/17 0533  WBC 9.0 9.9 9.5 10.1 10.3  NEUTROABS  --  8.0* 8.0* 8.4* 8.4*  HGB 9.7* 9.1* 9.3* 9.2* 9.0*  HCT 30.0* 27.8* 28.0* 26.9* 26.7*  MCV 98.4 97.5 96.2 93.7 93.0  PLT 335 316 310 279 102   Basic Metabolic Panel: Recent Labs  Lab 07/10/17 1906 07/11/17 0527 07/12/17 0528  07/13/17 0549 07/14/17 0533  NA 141 138 141 140 140  K 2.8* 3.2* 3.2* 3.2* 3.1*  CL 107 109 110 104 95*  CO2 19* 17* 20* 23 32  GLUCOSE 112* 79 111* 123* 117*  BUN 43* 47* 56* 62* 69*  CREATININE 3.82* 3.92* 4.51* 4.65* 4.62*  CALCIUM 8.3* 7.6* 8.1* 8.1* 8.5*  MG 2.3  --  2.1 2.1 2.1  PHOS  --   --  5.2* 4.9* 4.7*   GFR: Estimated Creatinine Clearance: 8.8 mL/min (A) (by C-G formula based on SCr of 4.62 mg/dL (H)). Liver Function Tests: Recent Labs  Lab 07/10/17 2236 07/11/17 0527 07/12/17 0528 07/13/17 0549 07/14/17 0533  AST 1,239* 992* 622* 529* 525*  ALT 843* 740* 592* 583* 597*  ALKPHOS 102 93 93 91 91  BILITOT 0.8 0.7 0.5 0.5 0.5  PROT 5.9* 5.1* 4.9* 4.7* 4.7*  ALBUMIN 2.7* 2.3* 2.1* 2.0* 2.0*   No results for input(s): LIPASE, AMYLASE in the last 168 hours. No results for input(s): AMMONIA in the last 168 hours. Coagulation Profile: No results for input(s): INR, PROTIME in the last 168 hours. Cardiac Enzymes:  Recent Labs  Lab 07/10/17 1906 07/11/17 0527 07/12/17 0528 07/13/17 0549 07/14/17 0533  CKTOTAL 40,213* 26,183* 18,056* 11,328* 12,940*   BNP (last 3 results) No results for input(s): PROBNP in the last 8760 hours. HbA1C: No results for input(s): HGBA1C in the last 72 hours. CBG: No results for input(s): GLUCAP in the last 168 hours. Lipid Profile: No results for input(s): CHOL, HDL, LDLCALC, TRIG, CHOLHDL, LDLDIRECT in the last 72 hours. Thyroid Function Tests: No results for input(s): TSH, T4TOTAL, FREET4, T3FREE, THYROIDAB in the last 72 hours. Anemia Panel: Recent Labs    07/13/17 0549  FERRITIN 121  TIBC 207*  IRON 69   Sepsis Labs: No results for input(s): PROCALCITON, LATICACIDVEN in the last 168 hours.  No results found for this or any previous visit (from the past 240 hour(s)).   Radiology Studies: No results found. Scheduled Meds: . feeding supplement (ENSURE ENLIVE)  237 mL Oral BID BM  . gabapentin  100 mg Oral QHS  .  metoprolol tartrate  50 mg Oral BID WC  . mometasone-formoterol  2 puff Inhalation BID  . pantoprazole  40 mg Oral Daily  . potassium chloride  20 mEq Oral BID   Continuous Infusions:   LOS: 3 days   Kerney Elbe, DO [facility] Pager [phone]  If 7PM-7AM, please contact night-coverage [email or web address] Password [facility] 07/14/2017, 4:39 PM

## 2017-07-14 NOTE — Progress Notes (Addendum)
Patient ID: Sara Fox, female   DOB: May 17, 1940, 77 y.o.   MRN: [id number] [facility] Progress Note   Assessment/ Plan:   1. Acute kidney injury: Suspect that this is possibly multifactorial from rhabdomyolysis and most likely hemodynamically mediated ATN.  Urine output improved with addition of furosemide-discontinue intravenous fluids at this time given physical exam and overall fluid balance and continue the intermittent dosing of diuretics at this time to limit volume overload. 2. Rhabdomyolysis: Suspected to be secondary to recent falls/bedridden state and ongoing statin use.  CPK level unchanged overnight. 3. Anemia: Seen by gastroenterology and their plans for expectant management noted. 4. Hypertension: Blood pressure is marginally elevated, will begin daily at bedtime Procardia XL if pressures remain elevated. 5. Hypokalemia: Secondary to losses from diarrhea and shifts from isotonic sodium bicarbonate drip, discontinue bicarbonate drip at this time will prescribe oral potassium.  Subjective:   Reports no chest pain or shortness of breath--states she is doing better   Objective:   BP 139/64 (BP Location: Right Arm)   Pulse 84   Temp 98.9 F (37.2 C) (Oral)   Resp 20   Ht 5\' 4"  (1.626 m)   Wt 65.3 kg (144 lb)   SpO2 94%   BMI 24.72 kg/m   Intake/Output Summary (Last 24 hours) at 07/14/2017 1210 Last data filed at 07/14/2017 1123 Gross per 24 hour  Intake 4350 ml  Output 2751 ml  Net 1599 ml   Weight change:   Physical Exam: Gen: Comfortably resting flat in bed CVS: Pulse regular rhythm, normal rate, S1 and S2 normal Resp: Coarse/transmitted breath sounds bilaterally without distinct rales or rhonchi Abd: Soft, flat, nontender Ext: Trace lower extremity edema  Imaging: No results found.  Labs: BMET Recent Labs  Lab 07/10/17 1906 07/11/17 0527 07/12/17 0528 07/13/17 0549 07/14/17 0533  NA 141 138 141 140 140  K 2.8* 3.2* 3.2* 3.2* 3.1*  CL  107 109 110 104 95*  CO2 19* 17* 20* 23 32  GLUCOSE 112* 79 111* 123* 117*  BUN 43* 47* 56* 62* 69*  CREATININE 3.82* 3.92* 4.51* 4.65* 4.62*  CALCIUM 8.3* 7.6* 8.1* 8.1* 8.5*  PHOS  --   --  5.2* 4.9* 4.7*   CBC Recent Labs  Lab 07/11/17 0527 07/12/17 0528 07/13/17 0549 07/14/17 0533  WBC 9.9 9.5 10.1 10.3  NEUTROABS 8.0* 8.0* 8.4* 8.4*  HGB 9.1* 9.3* 9.2* 9.0*  HCT 27.8* 28.0* 26.9* 26.7*  MCV 97.5 96.2 93.7 93.0  PLT 316 310 279 308    Medications:    . feeding supplement (ENSURE ENLIVE)  237 mL Oral BID BM  . gabapentin  100 mg Oral QHS  . metoprolol tartrate  50 mg Oral BID WC  . mometasone-formoterol  2 puff Inhalation BID  . pantoprazole  40 mg Oral Daily   Elmarie Shiley, MD 07/14/2017, 12:10 PM

## 2017-07-15 DIAGNOSIS — K759 Inflammatory liver disease, unspecified: Secondary | ICD-10-CM

## 2017-07-15 LAB — CBC WITH DIFFERENTIAL/PLATELET
Basophils Absolute: 0 10*3/uL (ref 0.0–0.1)
Basophils Relative: 0 %
Eosinophils Absolute: 0.1 10*3/uL (ref 0.0–0.7)
Eosinophils Relative: 1 %
HCT: 27.6 % — ABNORMAL LOW (ref 36.0–46.0)
Hemoglobin: 8.9 g/dL — ABNORMAL LOW (ref 12.0–15.0)
Lymphocytes Relative: 13 %
Lymphs Abs: 1.3 10*3/uL (ref 0.7–4.0)
MCH: 31.4 pg (ref 26.0–34.0)
MCHC: 32.2 g/dL (ref 30.0–36.0)
MCV: 97.5 fL (ref 78.0–100.0)
Monocytes Absolute: 0.8 10*3/uL (ref 0.1–1.0)
Monocytes Relative: 8 %
Neutro Abs: 7.3 10*3/uL (ref 1.7–7.7)
Neutrophils Relative %: 78 %
Platelets: 333 10*3/uL (ref 150–400)
RBC: 2.83 MIL/uL — ABNORMAL LOW (ref 3.87–5.11)
RDW: 15.3 % (ref 11.5–15.5)
WBC: 9.5 10*3/uL (ref 4.0–10.5)

## 2017-07-15 LAB — MAGNESIUM: Magnesium: 2.1 mg/dL (ref 1.7–2.4)

## 2017-07-15 LAB — COMPREHENSIVE METABOLIC PANEL
ALT: 568 U/L — ABNORMAL HIGH (ref 14–54)
AST: 435 U/L — ABNORMAL HIGH (ref 15–41)
Albumin: 2 g/dL — ABNORMAL LOW (ref 3.5–5.0)
Alkaline Phosphatase: 87 U/L (ref 38–126)
Anion gap: 12 (ref 5–15)
BUN: 69 mg/dL — ABNORMAL HIGH (ref 6–20)
CO2: 33 mmol/L — ABNORMAL HIGH (ref 22–32)
Calcium: 8.7 mg/dL — ABNORMAL LOW (ref 8.9–10.3)
Chloride: 92 mmol/L — ABNORMAL LOW (ref 101–111)
Creatinine, Ser: 4.45 mg/dL — ABNORMAL HIGH (ref 0.44–1.00)
GFR calc Af Amer: 10 mL/min — ABNORMAL LOW (ref >60)
GFR calc non Af Amer: 9 mL/min — ABNORMAL LOW (ref >60)
Glucose, Bld: 102 mg/dL — ABNORMAL HIGH (ref 65–99)
Potassium: 3.7 mmol/L (ref 3.5–5.1)
Sodium: 137 mmol/L (ref 135–145)
Total Bilirubin: 0.5 mg/dL (ref 0.3–1.2)
Total Protein: 5 g/dL — ABNORMAL LOW (ref 6.5–8.1)

## 2017-07-15 LAB — PHOSPHORUS: Phosphorus: 4.9 mg/dL — ABNORMAL HIGH (ref 2.5–4.6)

## 2017-07-15 LAB — CK: Total CK: 8943 U/L — ABNORMAL HIGH (ref 38–234)

## 2017-07-15 MED ORDER — POTASSIUM CHLORIDE CRYS ER 20 MEQ PO TBCR
20.0000 meq | EXTENDED_RELEASE_TABLET | Freq: Two times a day (BID) | ORAL | Status: AC
  Administered 2017-07-15: 20 meq via ORAL
  Filled 2017-07-15 (×2): qty 1

## 2017-07-15 MED ORDER — FUROSEMIDE 10 MG/ML IJ SOLN
40.0000 mg | Freq: Once | INTRAMUSCULAR | Status: AC
  Administered 2017-07-15: 40 mg via INTRAVENOUS
  Filled 2017-07-15: qty 4

## 2017-07-15 NOTE — Progress Notes (Addendum)
Patient ID: Sara Fox, female   DOB: 02-Mar-1941, 77 y.o.   MRN: [id number] [facility] Progress Note   Assessment/ Plan:   1. Acute kidney injury: Suspect that this is possibly multifactorial from rhabdomyolysis and most likely hemodynamically mediated ATN.  Improved UOP overnight (augmented with furosemide) and slight improvement in creatinine noted- will re-dose again with lasix and continue to monitor for renal recovery. 2. Rhabdomyolysis: Suspected to be secondary to recent falls/bedridden state and ongoing statin use.  CPK level improving. 3. Anemia: Seen by gastroenterology and their plans for expectant management noted. Iron stores replete.  4. Hypertension: Blood pressure currently acceptable, monitor with intermittent furosemide dosing. 5. Hypokalemia: Secondary to losses from diarrhea and shifts from isotonic sodium bicarbonate drip, corrected with oral replacement and will give an additional dose of KDur with furosemide today.  Subjective:   Reports to be feeling better today, denies any chest pain or shortness of breath.    Objective:   BP (!) 136/57 (BP Location: Left Arm)   Pulse 88   Temp 98.2 F (36.8 C) (Oral)   Resp 18   Ht 5\' 4"  (1.626 m)   Wt 65.5 kg (144 lb 6.4 oz)   SpO2 92%   BMI 24.79 kg/m   Intake/Output Summary (Last 24 hours) at 07/15/2017 1143 Last data filed at 07/15/2017 0800 Gross per 24 hour  Intake 1080 ml  Output 2050 ml  Net -970 ml   Weight change:   Physical Exam: Gen: Comfortably resting in bed CVS: Pulse regular rhythm, normal rate, S1 and S2 normal Resp: Coarse/transmitted breath sounds bilaterally without distinct rales or rhonchi Abd: Soft, flat, nontender Ext: Trace lower extremity edema  Imaging: No results found.  Labs: BMET Recent Labs  Lab 07/10/17 1906 07/11/17 0527 07/12/17 0528 07/13/17 0549 07/14/17 0533 07/15/17 0553  NA 141 138 141 140 140 137  K 2.8* 3.2* 3.2* 3.2* 3.1* 3.7  CL 107 109 110 104  95* 92*  CO2 19* 17* 20* 23 32 33*  GLUCOSE 112* 79 111* 123* 117* 102*  BUN 43* 47* 56* 62* 69* 69*  CREATININE 3.82* 3.92* 4.51* 4.65* 4.62* 4.45*  CALCIUM 8.3* 7.6* 8.1* 8.1* 8.5* 8.7*  PHOS  --   --  5.2* 4.9* 4.7* 4.9*   CBC Recent Labs  Lab 07/12/17 0528 07/13/17 0549 07/14/17 0533 07/15/17 0553  WBC 9.5 10.1 10.3 9.5  NEUTROABS 8.0* 8.4* 8.4* 7.3  HGB 9.3* 9.2* 9.0* 8.9*  HCT 28.0* 26.9* 26.7* 27.6*  MCV 96.2 93.7 93.0 97.5  PLT 310 279 308 333    Medications:    . feeding supplement (ENSURE ENLIVE)  237 mL Oral BID BM  . gabapentin  100 mg Oral QHS  . metoprolol tartrate  50 mg Oral BID WC  . mometasone-formoterol  2 puff Inhalation BID  . pantoprazole  40 mg Oral Daily   Elmarie Shiley, MD 07/15/2017, 11:43 AM

## 2017-07-15 NOTE — Progress Notes (Signed)
PROGRESS NOTE    Sara Fox  URK:[id number] DOB: [birth year] DOA: 07/10/2017 PCP: [name] Chard, MD   Brief Narrative:  Sara Fox is a 77 y.o. female with medical history significant of HTN, HLD, systolic CHF last EF 83-15% with grade 1 dFx, CAD; who presents with weakness and confusion over the last 1 week.  Patient reports having diarrhea going multiple times per day with dark stools.  Last bowel movement noted yesterday night.  Family reported patient having intermittent confusion, with increased lethargy, and weakness.  Associated symptoms include complaints of right leg pain, lower abdominal pain, dysuria, and lower extremity swelling. Patient has been more sedimentary.  Last month she has suffered a left wrist fracture.  Denies any recent fever, shortness of breath, antibiotics, travel, or falls.  She was seen by her PCP today and blood work was obtained.  Patient was called back and advised to come in to the emergency department for further evaluation due to abnormal labs.  Upon admission into the emergency department patient was noted to be afebrile with vital signs relatively within normal limits. She was found to have Acute Renal Failure and also found to have Rhabdomyolysis. Nephrology Consulted and managing Fluids and now have stopped. She also has had Melena but Hb/Hct has been Stable. GI has been consulted and recommending continue to observe and and elective Endoscopy this coming week if Patient's clinical condition improves.   Assessment & Plan:   Principal Problem:   ARF (acute renal failure) (HCC) Active Problems:   Normocytic anemia   Bilateral lower extremity edema   Rhabdomyolysis   Melena   Hypoalbuminemia   Elevated LFTs   Malnutrition of moderate degree  Acute Kidney Injury/Acute Renal Failure likely multifactorial in the setting of Rhabdomyolysis and Likely ATN -Acute. Patient's baseline creatinine previously noted to be within normal limits, but she  presented with a BUN of 43 and creatinine 3.82.  - Urinalysis was positive for large hemoglobin but negative for RBCs given concern for rhabdomyolysis.   -Given patient taking diuretics suspect prerenal cause of symptoms and lack of mobility . -Admitted to telemetry bed  -Strict intake and output; Patient is +4.371 Liters  -Isotonic Sodium Bicarbonate at a rate of 150 mL/hr now stopped by Renal  -Checked Abdominal ultrasound and showed no acute findings but mildly increased echotexture in the kidnesy bilaterally suggesting early chronic medical renal disease -Hold Nephrotoxic Agents -BUN/Cr now improving slightly and was 69/4.45 -Urinalysis had 100 Protein  -Nephrology consulted and appreciate further evaluation and recommendations -Nephrology checking serologies for Acute Glomerulonephritis and checking ANA, IFA, MPO/Pr3 (ANCA) -Nephrology stopped IVF resuscitation and giving another dose of IV Lasix today   Acute Rhabdomyolysis -Acute. CK elevated at 40,213 on admission and now improved to 8943 -IVF with Isotonic Bicarbonate to 150 mL/hr now D/C'd  -Check daily CPK   Melena -Acute.  Patient reports having dark diarrhea over the last 1-2 weeks; No episodes here -Stool guaiacs were ordered and noted to be weakly positive.   -No reported recent antibiotic use but has been taking Daily Ibuprofen.  Question possibility of upper GI bleed given elevated BUN. -Type and screen for possible need of blood products -Monitor H&H; Hb/Hct went from 9.7/30.0 -> 9.1/27.8 -> 9.3/28.0 -> 9.2/26.9 -> 8.9/27.6 -C/w Protonix 40 mg po Daily  -Gastroenterology Dr. Watt Climes consulted for further evaluation and recommendations; GI observing for now and will re-evaluate need for Endoscopy early next week   Normocytic Normochromic Anemia -Hemoglobin noted to be 9.7  on admission. Baseline hemoglobin previously noted to be around 10-11. -Hb/Hct went from 9.7/30.0 -> 9.1/27.8 -> 9.3/28.0 -> 9.2/26.9 -> 9.0/26.7 ->  8.9/27.6 -Anemia Panel showed Iron Level of 69, UBIC of 138, TIBC of 207, Saturation Ratio of 33, and Ferritin of 121 -Continue to Monitor for S/Sx of Bleeding -Repeat CBC in AM    Acute Encephalopathy, improved -Suspected secondary to dehydration. -Neurochecks -If continues to persist will get Head CT w/o Contrast but will hold off for now  Hypokalemia -Acute.  Initial potassium noted to be 2.8 on admission and now improved to 3.7.  -Continue to monitor and Replete as needed -Repeat CMP in AM   History of Combined Systolic and Diastolic CHF -Last EF noted to be around 45-50% with grade 1 diastolic dysfunction in 6295. -Hold furosemide and lisinopril due to acute renal failure -Continue Metoprolol 50 mg po BIDas tolerated -Strict I's/O's, Daily Weights -Repeat ECHOCardiogram done this visit showed EF of 60-65% and Grade 1 DD -Placed Foley for accurate Monitoring and patient is +4.371 Liters  -IV Lasix given again by Nephrology  -Weight is down 18 lbs and was not done today   Lower Extremity Edema, improved -Patient with at least 2+ pitting lower extremity edema on Admission, but now it's difficult to tell LE swelling given SCD's   -Symptoms likely could be secondary to hypoalbuminemia vs. Heart Failure  -Checked Doppler duplex ultrasound and was Negative for DVT -Continue to Monitor and seems to be improved with IV Diuresis   CAD -Status post DES stent placement and 09/2014. -Currently Holding Brilinta and Aspirin  Abnormal/Elevated LFTs, improving -Acute.   -Patient with significantly elevated AST and ALT which correlates with rhabdomyolysis;  -AST went from 1239 -> 992 -> 622 -> 529 -> 525 -> 435 -ALT went from 843 -> 740 -> 592 -> 583 -> 597 -> 568 -Levels should correct with IV fluids alone and now Trending down -Recheck CMP AM  Hypoalbuminemia -Acute.  Patient's initial albumin noted to be 2.7 on admission; Albumin now 2.0 -Checked prealbumin this AM and was  16.3 -Dietician/Nutritionist Consult appreciated  Hyperlipidemia  -Continue to Hold Statin   Hyperphosphatemia -In the setting of Acute Renal Failure -Phos Level went from 5.2 -> 4.9 -> 4.7 -> 4.9 -Continue to Monitor Phos Levels and repeat in AM   Generalized Weakness -PT/OT Evaluation recommending SNF  Essential Hypertension -Home BP medications currently being Held (ACE-I) -C/w Metoprolol 50 mg po BID -Nephrology recommending beginning Procardia XL qHS if remains elevated -BP was 124/59 today   Moderate Malnutrition in the Context of Chronic Illness -Nutritionist consulted and appreciate Evaluation and recommendations -C/w Ensure Enlive 237 mL po BID  DVT prophylaxis: SCDs Code Status: FULL CODE Family Communication: No family present at bedside Disposition Plan: Remain Inpateint for Continued Workup and Treatment; PT Evaluation recommending SNF; Possible EGD this week  Consultants:   Nephrology  Gastroenterology    Procedures:  Bilateral Lower Extremity Duplex Negative for obvious evidence of DVT.  ECHOCARDIOGRAM 07/12/17 ------------------------------------------------------------------- Study Conclusions  - Left ventricle: The cavity size was normal. There was mild   concentric hypertrophy. Systolic function was normal. The   estimated ejection fraction was in the range of 60% to 65%. Wall   motion was normal; there were no regional wall motion   abnormalities. Doppler parameters are consistent with abnormal   left ventricular relaxation (grade 1 diastolic dysfunction). - Mitral valve: Calcified annulus. - Atrial septum: No defect or patent foramen ovale was identified.  Antimicrobials:  Anti-infectives (From admission, onward)   None     Subjective: Seen and examined at bedside and had no complaints. Stated she was feeling better. No Nausea or vomiting.   Objective: Vitals:   07/14/17 2004 07/14/17 2015 07/15/17 0554 07/15/17 1401  BP:   127/64 (!) 136/57 (!) 124/59  Pulse:  82 88 92  Resp:  18 18 16   Temp:  98.8 F (37.1 C) 98.2 F (36.8 C) 99 F (37.2 C)  TempSrc:  Oral Oral Oral  SpO2: 95% 100% 92% 99%  Weight:      Height:        Intake/Output Summary (Last 24 hours) at 07/15/2017 1926 Last data filed at 07/15/2017 1717 Gross per 24 hour  Intake 1080 ml  Output 2550 ml  Net -1470 ml   Filed Weights   07/12/17 0500 07/13/17 0500 07/14/17 1643  Weight: 68.5 kg (151 lb) 65.3 kg (144 lb) 65.5 kg (144 lb 6.4 oz)   Examination: Physical Exam:  Constitutional: AAF WN/WD obese in NAD Eyes: Sclerae Anicteric. Lids Normal ENMT: External Ears and Nose appear Normal Neck: Supple with no HVD Respiratory: Diminished but unlabored breathing. No appreciable wheezing/rales/rhonchi Cardiovascular: RRR; No appreciable LE Edema Abdomen: Soft, NT, ND. Bowel sounds present  GU: Deferred Musculoskeletal: No contractures; No cyanosis Skin: Warm and Dry; No appreciable rashes noted Neurologic: CN 2-12 grossly intact. No appreciable focal deficits  Psychiatric: Awake and Alert. Normal mood and affect.  Data Reviewed: I have personally reviewed following labs and imaging studies  CBC: Recent Labs  Lab 07/11/17 0527 07/12/17 0528 07/13/17 0549 07/14/17 0533 07/15/17 0553  WBC 9.9 9.5 10.1 10.3 9.5  NEUTROABS 8.0* 8.0* 8.4* 8.4* 7.3  HGB 9.1* 9.3* 9.2* 9.0* 8.9*  HCT 27.8* 28.0* 26.9* 26.7* 27.6*  MCV 97.5 96.2 93.7 93.0 97.5  PLT 316 310 279 308 102   Basic Metabolic Panel: Recent Labs  Lab 07/10/17 1906 07/11/17 0527 07/12/17 0528 07/13/17 0549 07/14/17 0533 07/15/17 0553  NA 141 138 141 140 140 137  K 2.8* 3.2* 3.2* 3.2* 3.1* 3.7  CL 107 109 110 104 95* 92*  CO2 19* 17* 20* 23 32 33*  GLUCOSE 112* 79 111* 123* 117* 102*  BUN 43* 47* 56* 62* 69* 69*  CREATININE 3.82* 3.92* 4.51* 4.65* 4.62* 4.45*  CALCIUM 8.3* 7.6* 8.1* 8.1* 8.5* 8.7*  MG 2.3  --  2.1 2.1 2.1 2.1  PHOS  --   --  5.2* 4.9* 4.7* 4.9*    GFR: Estimated Creatinine Clearance: 9.1 mL/min (A) (by C-G formula based on SCr of 4.45 mg/dL (H)). Liver Function Tests: Recent Labs  Lab 07/11/17 0527 07/12/17 0528 07/13/17 0549 07/14/17 0533 07/15/17 0553  AST 992* 622* 529* 525* 435*  ALT 740* 592* 583* 597* 568*  ALKPHOS 93 93 91 91 87  BILITOT 0.7 0.5 0.5 0.5 0.5  PROT 5.1* 4.9* 4.7* 4.7* 5.0*  ALBUMIN 2.3* 2.1* 2.0* 2.0* 2.0*   No results for input(s): LIPASE, AMYLASE in the last 168 hours. No results for input(s): AMMONIA in the last 168 hours. Coagulation Profile: No results for input(s): INR, PROTIME in the last 168 hours. Cardiac Enzymes: Recent Labs  Lab 07/11/17 0527 07/12/17 0528 07/13/17 0549 07/14/17 0533 07/15/17 0553  CKTOTAL 26,183* 18,056* 11,328* 12,940* 8,943*   BNP (last 3 results) No results for input(s): PROBNP in the last 8760 hours. HbA1C: No results for input(s): HGBA1C in the last 72 hours. CBG: No results for input(s): GLUCAP in  the last 168 hours. Lipid Profile: No results for input(s): CHOL, HDL, LDLCALC, TRIG, CHOLHDL, LDLDIRECT in the last 72 hours. Thyroid Function Tests: No results for input(s): TSH, T4TOTAL, FREET4, T3FREE, THYROIDAB in the last 72 hours. Anemia Panel: Recent Labs    07/13/17 0549  FERRITIN 121  TIBC 207*  IRON 69   Sepsis Labs: No results for input(s): PROCALCITON, LATICACIDVEN in the last 168 hours.  No results found for this or any previous visit (from the past 240 hour(s)).   Radiology Studies: No results found. Scheduled Meds: . feeding supplement (ENSURE ENLIVE)  237 mL Oral BID BM  . gabapentin  100 mg Oral QHS  . metoprolol tartrate  50 mg Oral BID WC  . mometasone-formoterol  2 puff Inhalation BID  . pantoprazole  40 mg Oral Daily  . potassium chloride  20 mEq Oral BID   Continuous Infusions:   LOS: 4 days   Kerney Elbe, DO [facility] Pager [phone]  If 7PM-7AM, please contact  night-coverage [email or web address] Password  [facility] 07/15/2017, 7:26 PM

## 2017-07-16 ENCOUNTER — Telehealth: Payer: Self-pay | Admitting: Physical Medicine & Rehabilitation

## 2017-07-16 LAB — COMPREHENSIVE METABOLIC PANEL
ALT: 464 U/L — ABNORMAL HIGH (ref 14–54)
AST: 290 U/L — ABNORMAL HIGH (ref 15–41)
Albumin: 2.1 g/dL — ABNORMAL LOW (ref 3.5–5.0)
Alkaline Phosphatase: 79 U/L (ref 38–126)
Anion gap: 13 (ref 5–15)
BUN: 75 mg/dL — ABNORMAL HIGH (ref 6–20)
CO2: 34 mmol/L — ABNORMAL HIGH (ref 22–32)
Calcium: 9 mg/dL (ref 8.9–10.3)
Chloride: 96 mmol/L — ABNORMAL LOW (ref 101–111)
Creatinine, Ser: 4.4 mg/dL — ABNORMAL HIGH (ref 0.44–1.00)
GFR calc Af Amer: 10 mL/min — ABNORMAL LOW (ref >60)
GFR calc non Af Amer: 9 mL/min — ABNORMAL LOW (ref >60)
Glucose, Bld: 104 mg/dL — ABNORMAL HIGH (ref 65–99)
Potassium: 3.9 mmol/L (ref 3.5–5.1)
Sodium: 143 mmol/L (ref 135–145)
Total Bilirubin: 0.7 mg/dL (ref 0.3–1.2)
Total Protein: 5.1 g/dL — ABNORMAL LOW (ref 6.5–8.1)

## 2017-07-16 LAB — CBC WITH DIFFERENTIAL/PLATELET
Basophils Absolute: 0 10*3/uL (ref 0.0–0.1)
Basophils Relative: 0 %
Eosinophils Absolute: 0.1 10*3/uL (ref 0.0–0.7)
Eosinophils Relative: 2 %
HCT: 26.2 % — ABNORMAL LOW (ref 36.0–46.0)
Hemoglobin: 8.4 g/dL — ABNORMAL LOW (ref 12.0–15.0)
Lymphocytes Relative: 17 %
Lymphs Abs: 1.3 10*3/uL (ref 0.7–4.0)
MCH: 31.7 pg (ref 26.0–34.0)
MCHC: 32.1 g/dL (ref 30.0–36.0)
MCV: 98.9 fL (ref 78.0–100.0)
Monocytes Absolute: 0.9 10*3/uL (ref 0.1–1.0)
Monocytes Relative: 11 %
Neutro Abs: 5.6 10*3/uL (ref 1.7–7.7)
Neutrophils Relative %: 70 %
Platelets: 360 10*3/uL (ref 150–400)
RBC: 2.65 MIL/uL — ABNORMAL LOW (ref 3.87–5.11)
RDW: 15.4 % (ref 11.5–15.5)
WBC: 8 10*3/uL (ref 4.0–10.5)

## 2017-07-16 LAB — MAGNESIUM: Magnesium: 2.1 mg/dL (ref 1.7–2.4)

## 2017-07-16 LAB — PHOSPHORUS: Phosphorus: 5.1 mg/dL — ABNORMAL HIGH (ref 2.5–4.6)

## 2017-07-16 LAB — CK: Total CK: 4815 U/L — ABNORMAL HIGH (ref 38–234)

## 2017-07-16 MED ORDER — SODIUM CHLORIDE 0.9 % IV SOLN: INTRAVENOUS | Status: DC

## 2017-07-16 MED ORDER — SODIUM CHLORIDE 0.45 % IV SOLN
INTRAVENOUS | Status: DC
  Administered 2017-07-16 – 2017-07-20 (×5): via INTRAVENOUS

## 2017-07-16 NOTE — Telephone Encounter (Signed)
Pt's daughter phoned to cancel procedure for 03/07, stating patient is inpatient at [facility]. Pt's daughter wanted to know if the pt should continue with the injections, stating that the pt was having kidney trouble. Please advise.

## 2017-07-16 NOTE — Progress Notes (Signed)
Patient ID: Sara Fox, female   DOB: June 12, 1940, 77 y.o.   MRN: [id number] [facility] Progress Note   Assessment/ Plan:   1. Acute kidney injury: Suspect that this is multifactorial from rhabdomyolysis and ACEi effects resulting in ATN.  Creat stuck low 4's, will restart IVF's w 1/2 NS at 100/ hr.  2. Rhabdomyolysis: Suspected to be secondary to recent falls/bedridden state and ongoing statin use.  CPK level much better, down to 4K now.  3. Anemia: Seen by gastroenterology and their plans for expectant management noted. Iron stores replete.  4. Hypertension: Blood pressure currently acceptable, monitor with intermittent furosemide dosing. Holding home lisinopril w. AKI.  5. Met alkalosis: from bicarb drip which is now dc'd, agree w NS for now.   Kelly Splinter MD [employer] pgr [phone]   07/16/2017, 4:15 PM    Subjective:   No new c/o's, in good spirits, family at bedside   Objective:   BP 110/62 (BP Location: Left Arm)   Pulse 81   Temp 98.7 F (37.1 C) (Oral)   Resp 19   Ht '5\' 4"'$  (1.626 m)   Wt 73.5 kg (162 lb)   SpO2 100%   BMI 27.81 kg/m   Intake/Output Summary (Last 24 hours) at 07/16/2017 1613 Last data filed at 07/16/2017 1101 Gross per 24 hour  Intake 840 ml  Output 2550 ml  Net -1710 ml   Weight change: 7.983 kg (17 lb 9.6 oz)  Physical Exam: Gen: Comfortably resting in bed, dry mouth CVS: Pulse regular rhythm, normal rate, S1 and S2 normal Resp: Coarse/transmitted breath sounds bilaterally without distinct rales or rhonchi Abd: Soft, flat, nontender Ext: no LE edema  Imaging: No results found.  Labs: BMET Recent Labs  Lab 07/10/17 1906 07/11/17 4158 07/12/17 0528 07/13/17 0549 07/14/17 0533 07/15/17 0553 07/16/17 0607  NA 141 138 141 140 140 137 143  K 2.8* 3.2* 3.2* 3.2* 3.1* 3.7 3.9  CL 107 109 110 104 95* 92* 96*  CO2 19* 17* 20* 23 32 33* 34*  GLUCOSE 112* 79 111* 123* 117* 102* 104*  BUN 43* 47* 56*  62* 69* 69* 75*  CREATININE 3.82* 3.92* 4.51* 4.65* 4.62* 4.45* 4.40*  CALCIUM 8.3* 7.6* 8.1* 8.1* 8.5* 8.7* 9.0  PHOS  --   --  5.2* 4.9* 4.7* 4.9* 5.1*   CBC Recent Labs  Lab 07/13/17 0549 07/14/17 0533 07/15/17 0553 07/16/17 0607  WBC 10.1 10.3 9.5 8.0  NEUTROABS 8.4* 8.4* 7.3 5.6  HGB 9.2* 9.0* 8.9* 8.4*  HCT 26.9* 26.7* 27.6* 26.2*  MCV 93.7 93.0 97.5 98.9  PLT 279 308 333 360    Medications:    . feeding supplement (ENSURE ENLIVE)  237 mL Oral BID BM  . gabapentin  100 mg Oral QHS  . metoprolol tartrate  50 mg Oral BID WC  . mometasone-formoterol  2 puff Inhalation BID  . pantoprazole  40 mg Oral Daily   Elmarie Shiley, MD 07/16/2017, 4:13 PM

## 2017-07-16 NOTE — Progress Notes (Signed)
PROGRESS NOTE    Sara Fox  MPN:[id number] DOB: April 23, 1941 DOA: 07/10/2017 PCP: [name] Chard, MD   Brief Narrative:  Sara Fox is a 77 y.o. female with medical history significant of HTN, HLD, systolic CHF last EF 00-86% with grade 1 dFx, CAD; who presents with weakness and confusion over the last 1 week.  Patient reports having diarrhea going multiple times per day with dark stools.  Last bowel movement noted yesterday night.  Family reported patient having intermittent confusion, with increased lethargy, and weakness.  Associated symptoms include complaints of right leg pain, lower abdominal pain, dysuria, and lower extremity swelling. Patient has been more sedimentary.  Last month she has suffered a left wrist fracture.  Denies any recent fever, shortness of breath, antibiotics, travel, or falls.  She was seen by her PCP today and blood work was obtained.  Patient was called back and advised to come in to the emergency department for further evaluation due to abnormal labs.  Upon admission into the emergency department patient was noted to be afebrile with vital signs relatively within normal limits. She was found to have Acute Renal Failure and also found to have Rhabdomyolysis. Nephrology Consulted and managing Fluids and now have stopped. She also has had Melena but Hb/Hct has been Stable. GI has been consulted and recommending continue to observe and and elective Endoscopy this coming week if Patient's clinical condition improves.  Nephrology restarting IVF at 1/2 NS at 100 mL/hr. Patient improving and having no complaints.   Assessment & Plan:   Principal Problem:   ARF (acute renal failure) (HCC) Active Problems:   Normocytic anemia   Bilateral lower extremity edema   Rhabdomyolysis   Melena   Hypoalbuminemia   Elevated LFTs   Malnutrition of moderate degree  Acute Kidney Injury/Acute Renal Failure likely multifactorial in the setting of Rhabdomyolysis and Likely  ATN -Acute. Patient's baseline creatinine previously noted to be within normal limits, but she presented with a BUN of 43 and creatinine 3.82.  - Urinalysis was positive for large hemoglobin but negative for RBCs given concern for rhabdomyolysis.   -Given patient taking diuretics suspect prerenal cause of symptoms and lack of mobility . -Admitted to telemetry bed  -Strict intake and output; Patient is +3.211 Liters  -Isotonic Sodium Bicarbonate at a rate of 150 mL/hr now stopped by Renal (Patient becoming Alkalotic) but restarted IVF as below  -Checked Abdominal ultrasound and showed no acute findings but mildly increased echotexture in the kidnesy bilaterally suggesting early chronic medical renal disease -Hold Nephrotoxic Agents -BUN/Cr now improving slightly and went from 69/4.45 -> 75/4.40 -Urinalysis had 100 Protein  -Nephrology consulted and appreciate further evaluation and recommendations -C/w Foley Catheter for now and remove when Cr further improving  -Nephrology checking serologies for Acute Glomerulonephritis and checking ANA, IFA, MPO/Pr3 (ANCA) -Nephrology restarting IVF at 1/2 NS at 100 mL/hr  Acute Rhabdomyolysis -Acute. CK elevated at 40,213 on admission and now improved to 4,815 -IVF with Isotonic Bicarbonate to 150 mL/hr now D/C'd  -Check daily CPK   Melena -Acute.  Patient reports having dark diarrhea over the last 1-2 weeks; No episodes here -Stool guaiacs were ordered and noted to be weakly positive.   -No reported recent antibiotic use but has been taking Daily Ibuprofen.  Question possibility of upper GI bleed given elevated BUN. -Type and screen for possible need of blood products -Monitor H&H; Hb/Hct went from 9.7/30.0 -> 9.1/27.8 -> 9.3/28.0 -> 9.2/26.9 -> 8.9/27.6 -> 8.4/26.2 -C/w Protonix  40 mg po Daily  -Gastroenterology consulted for further evaluation and recommendations; GI observing for now and will re-evaluate need for Endoscopy early next week    Normocytic Normochromic Anemia -Hemoglobin noted to be 9.7 on admission. Baseline hemoglobin previously noted to be around 10-11. -Hb/Hct went from 9.7/30.0 -> 9.1/27.8 -> 9.3/28.0 -> 9.2/26.9 -> 9.0/26.7 -> 8.9/27.6 -> 8.4/26.2 -Anemia Panel showed Iron Level of 69, UBIC of 138, TIBC of 207, Saturation Ratio of 33, and Ferritin of 121 -Continue to Monitor for S/Sx of Bleeding -Repeat CBC in AM    Acute Encephalopathy, improved -Suspected secondary to dehydration. -Neurochecks -If continues to persist will get Head CT w/o Contrast but will hold off for now  Hypokalemia, improved  -Acute.  Initial potassium noted to be 2.8 on admission and now improved to 3.9.  -Continue to monitor and Replete as needed -Repeat CMP in AM   History of Combined Systolic and Diastolic CHF -Last EF noted to be around 45-50% with grade 1 diastolic dysfunction in 7628. -Hold furosemide and lisinopril due to acute renal failure -Continue Metoprolol 50 mg po BIDas tolerated -Strict I's/O's, Daily Weights -Repeat ECHOCardiogram done this visit showed EF of 60-65% and Grade 1 DD -Placed Foley for accurate Monitoring and patient is +3.211 Liters  -IV Lasix given again by Nephrology  -Weight is down 18 lbs and was not done today   Lower Extremity Edema, improved -Patient with at least 2+ pitting lower extremity edema on Admission, but now it's difficult to tell LE swelling given SCD's   -Symptoms likely could be secondary to hypoalbuminemia vs. Heart Failure  -Checked Doppler duplex ultrasound and was Negative for DVT -Continue to Monitor and seems to be improved with IV Diuresis   CAD -Status post DES stent placement and 09/2014. -Currently Holding Brilinta and Aspirin  Abnormal/Elevated LFTs, improving -Acute.   -Patient with significantly elevated AST and ALT which correlates with rhabdomyolysis;  -AST went from 1239 -> 992 -> 622 -> 529 -> 525 -> 435 -> 290 -ALT went from 843 -> 740 -> 592 ->  583 -> 597 -> 568 -> 464 -Levels should correct with IV fluids alone and now Trending down -Recheck CMP AM  Hypoalbuminemia -Acute.  Patient's initial albumin noted to be 2.7 on admission; Albumin now 2.1 -Checked prealbumin this AM and was 16.3 -Dietician/Nutritionist Consult appreciated  Hyperlipidemia  -Continue to Hold Statin   Hyperphosphatemia -In the setting of Acute Renal Failure -Phos Level went from 5.2 -> 4.9 -> 4.7 -> 4.9 -> 5.1 -Continue to Monitor Phos Levels and repeat in AM   Generalized Weakness -PT/OT Evaluation recommending SNF  Essential Hypertension -Home BP medications currently being Held (ACE-I) -C/w Metoprolol 50 mg po BID -Nephrology recommending beginning Procardia XL qHS if remains elevated -BP was 110/62 today   Moderate Malnutrition in the Context of Chronic Illness -Nutritionist consulted and appreciate Evaluation and recommendations -C/w Ensure Enlive 237 mL po BID  DVT prophylaxis: SCDs; Holding Pharmacological Prophylaxis given concern for Melena/GIB Code Status: FULL CODE Family Communication: No family present at bedside Disposition Plan: Remain Inpateint for Continued Workup and Treatment; PT Evaluation recommending SNF; Possible EGD this week  Consultants:   Nephrology  Gastroenterology    Procedures:  Bilateral Lower Extremity Duplex Negative for obvious evidence of DVT.  ECHOCARDIOGRAM 07/12/17 ------------------------------------------------------------------- Study Conclusions  - Left ventricle: The cavity size was normal. There was mild   concentric hypertrophy. Systolic function was normal. The   estimated ejection fraction was in the range  of 60% to 65%. Wall   motion was normal; there were no regional wall motion   abnormalities. Doppler parameters are consistent with abnormal   left ventricular relaxation (grade 1 diastolic dysfunction). - Mitral valve: Calcified annulus. - Atrial septum: No defect or patent  foramen ovale was identified.   Antimicrobials:  Anti-infectives (From admission, onward)   None     Subjective: Seen and examined at bedside and was sitting in chair. Stated legs were sore  Objective: Vitals:   07/15/17 2039 07/16/17 0647 07/16/17 0832 07/16/17 1439  BP:  (!) 120/59  110/62  Pulse:  87  81  Resp:  19  19  Temp:  97.9 F (36.6 C)  98.7 F (37.1 C)  TempSrc:  Axillary  Oral  SpO2: 92% 100% 95% 100%  Weight:  73.5 kg (162 lb)    Height:        Intake/Output Summary (Last 24 hours) at 07/16/2017 1552 Last data filed at 07/16/2017 1101 Gross per 24 hour  Intake 840 ml  Output 2550 ml  Net -1710 ml   Filed Weights   07/13/17 0500 07/14/17 1643 07/16/17 0647  Weight: 65.3 kg (144 lb) 65.5 kg (144 lb 6.4 oz) 73.5 kg (162 lb)   Examination: Physical Exam:  Constitutional: Obese AAF who is WN/WD in NAD sitting in chair bedside Eyes: Sclerae anicteric. Lids normal ENMT: External Ears and Nose appear normal Neck: Supple with no JVD Respiratory: Diminished but unlabored breathing; Not wearing supplemental O2 Cardiovascular: RRR; No appreciable LE today Abdomen: Soft, NT, ND. Bowel sounds present GU: Deferred Musculoskeletal: No contractures; no cyanosis Skin: Warm and Dry; No appreciable rashes noted Neurologic: CN 2-12 grossly intact. No appreciable focal deficits Psychiatric: Awake and alert. Normal and pleasant mood and affect.   Data Reviewed: I have personally reviewed following labs and imaging studies  CBC: Recent Labs  Lab 07/12/17 0528 07/13/17 0549 07/14/17 0533 07/15/17 0553 07/16/17 0607  WBC 9.5 10.1 10.3 9.5 8.0  NEUTROABS 8.0* 8.4* 8.4* 7.3 5.6  HGB 9.3* 9.2* 9.0* 8.9* 8.4*  HCT 28.0* 26.9* 26.7* 27.6* 26.2*  MCV 96.2 93.7 93.0 97.5 98.9  PLT 310 279 308 333 027   Basic Metabolic Panel: Recent Labs  Lab 07/12/17 0528 07/13/17 0549 07/14/17 0533 07/15/17 0553 07/16/17 0607  NA 141 140 140 137 143  K 3.2* 3.2* 3.1* 3.7 3.9   CL 110 104 95* 92* 96*  CO2 20* 23 32 33* 34*  GLUCOSE 111* 123* 117* 102* 104*  BUN 56* 62* 69* 69* 75*  CREATININE 4.51* 4.65* 4.62* 4.45* 4.40*  CALCIUM 8.1* 8.1* 8.5* 8.7* 9.0  MG 2.1 2.1 2.1 2.1 2.1  PHOS 5.2* 4.9* 4.7* 4.9* 5.1*   GFR: Estimated Creatinine Clearance: 10.5 mL/min (A) (by C-G formula based on SCr of 4.4 mg/dL (H)). Liver Function Tests: Recent Labs  Lab 07/12/17 0528 07/13/17 0549 07/14/17 0533 07/15/17 0553 07/16/17 0607  AST 622* 529* 525* 435* 290*  ALT 592* 583* 597* 568* 464*  ALKPHOS 93 91 91 87 79  BILITOT 0.5 0.5 0.5 0.5 0.7  PROT 4.9* 4.7* 4.7* 5.0* 5.1*  ALBUMIN 2.1* 2.0* 2.0* 2.0* 2.1*   No results for input(s): LIPASE, AMYLASE in the last 168 hours. No results for input(s): AMMONIA in the last 168 hours. Coagulation Profile: No results for input(s): INR, PROTIME in the last 168 hours. Cardiac Enzymes: Recent Labs  Lab 07/12/17 0528 07/13/17 0549 07/14/17 0533 07/15/17 0553 07/16/17 0607  CKTOTAL 18,056* 11,328* 12,940*  6,222* 4,815*   BNP (last 3 results) No results for input(s): PROBNP in the last 8760 hours. HbA1C: No results for input(s): HGBA1C in the last 72 hours. CBG: No results for input(s): GLUCAP in the last 168 hours. Lipid Profile: No results for input(s): CHOL, HDL, LDLCALC, TRIG, CHOLHDL, LDLDIRECT in the last 72 hours. Thyroid Function Tests: No results for input(s): TSH, T4TOTAL, FREET4, T3FREE, THYROIDAB in the last 72 hours. Anemia Panel: No results for input(s): VITAMINB12, FOLATE, FERRITIN, TIBC, IRON, RETICCTPCT in the last 72 hours. Sepsis Labs: No results for input(s): PROCALCITON, LATICACIDVEN in the last 168 hours.  No results found for this or any previous visit (from the past 240 hour(s)).   Radiology Studies: No results found. Scheduled Meds: . feeding supplement (ENSURE ENLIVE)  237 mL Oral BID BM  . gabapentin  100 mg Oral QHS  . metoprolol tartrate  50 mg Oral BID WC  .  mometasone-formoterol  2 puff Inhalation BID  . pantoprazole  40 mg Oral Daily   Continuous Infusions:   LOS: 5 days   Kerney Elbe, DO [facility] Pager [phone]  If 7PM-7AM, please contact night-coverage [email or web address] Password [email or web address] 07/16/2017, 3:52 PM

## 2017-07-16 NOTE — Care Management Important Message (Signed)
Important Message  Patient Details  Name: LAKYRA TIPPINS MRN: [id number] Date of Birth: 1941/03/27   Medicare Important Message Given:  Yes    Kerin Salen 07/16/2017, 10:29 [place] Message  Patient Details  Name: JACQUELYNN FRIEND MRN: [id number] Date of Birth: [birth year]   Medicare Important Message Given:  Yes    Kerin Salen 07/16/2017, 10:29 AM

## 2017-07-16 NOTE — Telephone Encounter (Signed)
We can reschedule injections for 1 month.  The oral medications have been causing drowsiness and have been reduced.  We have few other options other than the injections.  THese can be done without contrast if necessary although we use only tiny amounts (1/10 of a teaspoon or less), the lidocaine and celestone do not cause kidneys function to worsen

## 2017-07-16 NOTE — Progress Notes (Signed)
LCSW following for SNF placement.  LCSW spoke with patients granddaughter, Jasmine by phone.   Delana Meyer is agreeable to SNF and agrees with patient on [facility].   LCSW emailed SNF list to [facility] to explore other options if [facility] does not have a bed at dc.   PLAN: Patient will go to SNF at dc.   Carolin Coy [place] [facility] [place]

## 2017-07-17 LAB — CBC WITH DIFFERENTIAL/PLATELET
Basophils Absolute: 0 10*3/uL (ref 0.0–0.1)
Basophils Relative: 0 %
Eosinophils Absolute: 0.2 10*3/uL (ref 0.0–0.7)
Eosinophils Relative: 2 %
HCT: 24.6 % — ABNORMAL LOW (ref 36.0–46.0)
Hemoglobin: 7.8 g/dL — ABNORMAL LOW (ref 12.0–15.0)
Lymphocytes Relative: 15 %
Lymphs Abs: 1.3 10*3/uL (ref 0.7–4.0)
MCH: 31.6 pg (ref 26.0–34.0)
MCHC: 31.7 g/dL (ref 30.0–36.0)
MCV: 99.6 fL (ref 78.0–100.0)
Monocytes Absolute: 0.9 10*3/uL (ref 0.1–1.0)
Monocytes Relative: 10 %
Neutro Abs: 6.6 10*3/uL (ref 1.7–7.7)
Neutrophils Relative %: 73 %
Platelets: 362 10*3/uL (ref 150–400)
RBC: 2.47 MIL/uL — ABNORMAL LOW (ref 3.87–5.11)
RDW: 15.4 % (ref 11.5–15.5)
WBC: 9 10*3/uL (ref 4.0–10.5)

## 2017-07-17 LAB — MAGNESIUM: Magnesium: 2.1 mg/dL (ref 1.7–2.4)

## 2017-07-17 LAB — COMPREHENSIVE METABOLIC PANEL
ALT: 394 U/L — ABNORMAL HIGH (ref 14–54)
AST: 213 U/L — ABNORMAL HIGH (ref 15–41)
Albumin: 2.2 g/dL — ABNORMAL LOW (ref 3.5–5.0)
Alkaline Phosphatase: 77 U/L (ref 38–126)
Anion gap: 13 (ref 5–15)
BUN: 76 mg/dL — ABNORMAL HIGH (ref 6–20)
CO2: 31 mmol/L (ref 22–32)
Calcium: 8.7 mg/dL — ABNORMAL LOW (ref 8.9–10.3)
Chloride: 97 mmol/L — ABNORMAL LOW (ref 101–111)
Creatinine, Ser: 4.03 mg/dL — ABNORMAL HIGH (ref 0.44–1.00)
GFR calc Af Amer: 11 mL/min — ABNORMAL LOW (ref >60)
GFR calc non Af Amer: 10 mL/min — ABNORMAL LOW (ref >60)
Glucose, Bld: 102 mg/dL — ABNORMAL HIGH (ref 65–99)
Potassium: 3.8 mmol/L (ref 3.5–5.1)
Sodium: 141 mmol/L (ref 135–145)
Total Bilirubin: 0.7 mg/dL (ref 0.3–1.2)
Total Protein: 5 g/dL — ABNORMAL LOW (ref 6.5–8.1)

## 2017-07-17 LAB — FANA STAINING PATTERNS: Homogeneous Pattern: 1:160 {titer} — ABNORMAL HIGH

## 2017-07-17 LAB — ANTINUCLEAR ANTIBODIES, IFA: ANA Ab, IFA: POSITIVE — AB

## 2017-07-17 LAB — PHOSPHORUS: Phosphorus: 5.2 mg/dL — ABNORMAL HIGH (ref 2.5–4.6)

## 2017-07-17 LAB — CK: Total CK: 3229 U/L — ABNORMAL HIGH (ref 38–234)

## 2017-07-17 NOTE — Progress Notes (Signed)
Occupational Therapy Treatment Patient Details Name: Sara Fox MRN: [id number] DOB: July 10, 1940 Today's Date: 07/17/2017    History of present illness 77 yo female admitted with weakness, confusion, diarrhea, ARF. hx of CHF, HTN, CAD, falls, L wrist fx?   OT comments  Pt demonstrates bed, bed<>BSC,BSC <>chair transfer this session with one person (A). Pt requires min cues for safety and demonstrates some executive cognitive deficits present. Pt able to setup breakfast tray and start meal appropriately.   Follow Up Recommendations  SNF    Equipment Recommendations  Other (comment)(defer to snf ; BSC RW)    Recommendations for Other Services      Precautions / Restrictions Precautions Precautions: Fall Precaution Comments: advised RN / TEch to attempt bowel/ bladder program of transfer to 3n1 this afternoon to decr incontinence.        Mobility Bed Mobility Overal bed mobility: Needs Assistance Bed Mobility: Rolling;Supine to Sit Rolling: Min assist   Supine to sit: Mod assist     General bed mobility comments: Pt log rolling to the Left and needed (A) to elevate trunk from bed surface. Pt with posterior lean initially  Transfers Overall transfer level: Needs assistance Equipment used: 1 person hand held assist Transfers: Sit to/from [facility] Sit to Stand: Mod assist Stand pivot transfers: Mod assist       General transfer comment: pt initiates and requires min cues for safety. pt stepping toward chair. RN advised two person (A) for safety    Balance Overall balance assessment: Needs assistance Sitting-balance support: Bilateral upper extremity supported;Feet supported Sitting balance-Leahy Scale: Fair     Standing balance support: Bilateral upper extremity supported;During functional activity Standing balance-Leahy Scale: Poor                             ADL either performed or assessed with clinical judgement   ADL Overall  ADL's : Needs assistance/impaired Eating/Feeding: Modified independent   Grooming: Modified independent;Wash/dry hands;Wash/dry Sports administrator: Moderate assistance;Stand-pivot;BSC Toilet Transfer Details (indicate cue type and reason): pt transfered to the R to [facility]. pt needed mod cues for safety Toileting- Clothing Manipulation and Hygiene: Set up;Sitting/lateral lean         General ADL Comments: pt transfered from bed to bsc and then bsc to chair for breakfast.      Vision       Perception     Praxis      Cognition Arousal/Alertness: Awake/alert Behavior During Therapy: WFL for tasks assessed/performed Overall Cognitive Status: No family/caregiver present to determine baseline cognitive functioning                                 General Comments: pt repeating "where is my walker" during session. pt seems fixated on this fact initially but then distracted with meal in the chair   Pt voiding bladder ~100 CC or less. Tech reports bed was wet on arrival and just finishing a bath. Recommend bladder/ bowel program of every hour to 2 hours stand pivot to [facility] to decr incontinence during waking hours. Pure wick for night time use.      Exercises     Shoulder Instructions       General Comments      Pertinent Vitals/ Pain  Pain Assessment: No/denies pain  Home Living                                          Prior Functioning/Environment              Frequency  Min 2X/week        Progress Toward Goals  OT Goals(current goals can now be found in the care plan section)  Progress towards OT goals: Progressing toward goals  Acute Rehab OT Goals Patient Stated Goal: none stated OT Goal Formulation: With patient Time For Goal Achievement: 07/27/17 Potential to Achieve Goals: Good ADL Goals Pt Will Transfer to Toilet: with mod assist;with +2 assist;bedside commode;squat pivot  transfer Additional ADL Goal #1: pt will sit unsupported EOB with min guard and perform light adl/strengthening activity x 10 minutes Additional ADL Goal #2: pt will perform bed mobility with min A in preparation for adls  Plan Discharge plan remains appropriate    Co-evaluation                 AM-PAC PT "6 Clicks" Daily Activity     Outcome Measure   Help from another person eating meals?: A Little Help from another person taking care of personal grooming?: A Little Help from another person toileting, which includes using toliet, bedpan, or urinal?: A Little Help from another person bathing (including washing, rinsing, drying)?: A Lot Help from another person to put on and taking off regular upper body clothing?: A Little Help from another person to put on and taking off regular lower body clothing?: A Little 6 Click Score: 17    End of Session Equipment Utilized During Treatment: Gait belt  OT Visit Diagnosis: Muscle weakness (generalized) (M62.81)   Activity Tolerance Patient tolerated treatment well   Patient Left in chair;with call bell/phone within reach;with chair alarm set   Nurse Communication Mobility status;Precautions        Time: 3419-6222 OT Time Calculation (min): 16 min  Charges: OT General Charges $OT Visit: 1 Visit OT Treatments $Self Care/Home Management : 8-22 mins   Jeri Modena   OTR/L Pager: [phone] Office: [phone] .    Parke Poisson B 07/17/2017, 11:51 AM

## 2017-07-17 NOTE — Progress Notes (Signed)
Subjective: She was seen and examined at bedside. I'm told that she had one dark stools today, not black, formed and nonbloody. She has been able to tolerate regular food, denies difficulty swallowing, pain on swallowing or acid reflux. Denies abdominal pain currently.  Objective: Vital signs in last 24 hours: Temp:  [98.7 F (37.1 C)-99.2 F (37.3 C)] 98.8 F (37.1 C) (03/05 0407) Pulse Rate:  [80-81] 81 (03/05 0407) Resp:  [19-20] 20 (03/05 0407) BP: (110-126)/(45-66) 111/45 (03/05 0407) SpO2:  [90 %-100 %] 93 % (03/05 0824) Weight:  [72.6 kg (160 lb)] 72.6 kg (160 lb) (03/05 0408) Weight change: -0.907 kg () Last BM Date: 07/14/17  PE: Ill-appearing, appears older than stated age GENERAL: Mild pallor, no obvious icterus ABDOMEN: Soft, nontender EXTREMITIES: No deformity  Lab Results: Results for orders placed or performed during the hospital encounter of 07/10/17 (from the past 48 hour(s))  CK     Status: Abnormal   Collection Time: 07/16/17  6:07 AM  Result Value Ref Range   Total CK 4,815 (H) 38 - 234 U/L    Comment: RESULTS CONFIRMED BY MANUAL DILUTION Performed at [facility], [place] [address], [place], [place] [address]   Magnesium     Status: None   Collection Time: 07/16/17  6:07 AM  Result Value Ref Range   Magnesium 2.1 1.7 - 2.4 mg/dL    Comment: Performed at [facility], [place] [address], [place], [place] [address]  Comprehensive metabolic panel     Status: Abnormal   Collection Time: 07/16/17  6:07 AM  Result Value Ref Range   Sodium 143 135 - 145 mmol/L   Potassium 3.9 3.5 - 5.1 mmol/L   Chloride 96 (L) 101 - 111 mmol/L   CO2 34 (H) 22 - 32 mmol/L   Glucose, Bld 104 (H) 65 - 99 mg/dL   BUN 75 (H) 6 - 20 mg/dL   Creatinine, Ser 4.40 (H) 0.44 - 1.00 mg/dL   Calcium 9.0 8.9 - 10.3 mg/dL   Total Protein 5.1 (L) 6.5 - 8.1 g/dL   Albumin 2.1 (L) 3.5 - 5.0 g/dL   AST 290 (H) 15 - 41 U/L   ALT 464 (H) 14 - 54 U/L   Alkaline Phosphatase 79 38 - 126 U/L   Total Bilirubin 0.7 0.3 - 1.2 mg/dL   GFR calc non Af Amer 9 (L) >60 mL/min   GFR calc Af Amer 10 (L) >60 mL/min    Comment: (NOTE) The eGFR has been calculated using the CKD EPI equation. This calculation has not been validated in all clinical situations. eGFR's persistently <60 mL/min signify possible Chronic Kidney Disease.    Anion gap 13 5 - 15    Comment: Performed at [facility], [place] [address], [place], [place] [address]  CBC with Differential/Platelet     Status: Abnormal   Collection Time: 07/16/17  6:07 AM  Result Value Ref Range   WBC 8.0 4.0 - 10.5 K/uL   RBC 2.65 (L) 3.87 - 5.11 MIL/uL   Hemoglobin 8.4 (L) 12.0 - 15.0 g/dL   HCT 26.2 (L) 36.0 - 46.0 %   MCV 98.9 78.0 - 100.0 fL   MCH 31.7 26.0 - 34.0 pg   MCHC 32.1 30.0 - 36.0 g/dL   RDW 15.4 11.5 - 15.5 %   Platelets 360 150 - 400 K/uL   Neutrophils Relative % 70 %   Neutro Abs 5.6 1.7 - 7.7 K/uL  Lymphocytes Relative 17 %   Lymphs Abs 1.3 0.7 - 4.0 K/uL   Monocytes Relative 11 %   Monocytes Absolute 0.9 0.1 - 1.0 K/uL   Eosinophils Relative 2 %   Eosinophils Absolute 0.1 0.0 - 0.7 K/uL   Basophils Relative 0 %   Basophils Absolute 0.0 0.0 - 0.1 K/uL    Comment: Performed at [facility], [place] [address] [place], [place] [address]  Phosphorus     Status: Abnormal   Collection Time: 07/16/17  6:07 AM  Result Value Ref Range   Phosphorus 5.1 (H) 2.5 - 4.6 mg/dL    Comment: Performed at [facility], [place] [address] [place], [place] [address]  CK     Status: Abnormal   Collection Time: 07/17/17  6:07 AM  Result Value Ref Range   Total CK 3,229 (H) 38 - 234 U/L    Comment: Performed at [facility], [place] [address], [place], [place] [address]  Comprehensive metabolic panel     Status: Abnormal   Collection Time: 07/17/17  6:07 AM  Result Value Ref Range   Sodium 141 135 - 145 mmol/L    Potassium 3.8 3.5 - 5.1 mmol/L   Chloride 97 (L) 101 - 111 mmol/L   CO2 31 22 - 32 mmol/L   Glucose, Bld 102 (H) 65 - 99 mg/dL   BUN 76 (H) 6 - 20 mg/dL   Creatinine, Ser 4.03 (H) 0.44 - 1.00 mg/dL   Calcium 8.7 (L) 8.9 - 10.3 mg/dL   Total Protein 5.0 (L) 6.5 - 8.1 g/dL   Albumin 2.2 (L) 3.5 - 5.0 g/dL   AST 213 (H) 15 - 41 U/L   ALT 394 (H) 14 - 54 U/L   Alkaline Phosphatase 77 38 - 126 U/L   Total Bilirubin 0.7 0.3 - 1.2 mg/dL   GFR calc non Af Amer 10 (L) >60 mL/min   GFR calc Af Amer 11 (L) >60 mL/min    Comment: (NOTE) The eGFR has been calculated using the CKD EPI equation. This calculation has not been validated in all clinical situations. eGFR's persistently <60 mL/min signify possible Chronic Kidney Disease.    Anion gap 13 5 - 15    Comment: Performed at [facility], [place] [address], [place], [place] [address]  CBC with Differential/Platelet     Status: Abnormal   Collection Time: 07/17/17  6:07 AM  Result Value Ref Range   WBC 9.0 4.0 - 10.5 K/uL   RBC 2.47 (L) 3.87 - 5.11 MIL/uL   Hemoglobin 7.8 (L) 12.0 - 15.0 g/dL   HCT 24.6 (L) 36.0 - 46.0 %   MCV 99.6 78.0 - 100.0 fL   MCH 31.6 26.0 - 34.0 pg   MCHC 31.7 30.0 - 36.0 g/dL   RDW 15.4 11.5 - 15.5 %   Platelets 362 150 - 400 K/uL   Neutrophils Relative % 73 %   Neutro Abs 6.6 1.7 - 7.7 K/uL   Lymphocytes Relative 15 %   Lymphs Abs 1.3 0.7 - 4.0 K/uL   Monocytes Relative 10 %   Monocytes Absolute 0.9 0.1 - 1.0 K/uL   Eosinophils Relative 2 %   Eosinophils Absolute 0.2 0.0 - 0.7 K/uL   Basophils Relative 0 %   Basophils Absolute 0.0 0.0 - 0.1 K/uL    Comment: Performed at [facility], [place] [address] [place], [place] [address]  Magnesium     Status: None  Collection Time: 07/17/17  6:07 AM  Result Value Ref Range   Magnesium 2.1 1.7 - 2.4 mg/dL    Comment: Performed at [facility], [place] [address] [place], [place] [address]  Phosphorus      Status: Abnormal   Collection Time: 07/17/17  6:07 AM  Result Value Ref Range   Phosphorus 5.2 (H) 2.5 - 4.6 mg/dL    Comment: Performed at [facility], [place] [address], [place], [place] [address]    Studies/Results: No results found.  Medications: I have reviewed the patient's current medications.  Assessment: 1. Anemia and black stools and presentation suspicious for upper GI bleeding. Hemoglobin slightly trending down, 9.2/19/8.9/8.4/7.8.? Hemodilutional Iron saturation 33%, ferritin 121. 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  2. Rhabdomyolysis 3. Renal impairment 4. Abnormal LFTs(T bili normal at 0.7, AST trended down from 529-213, AST trended down from 597-394, normal alkaline phosphatase 77), likely secondary to rhabdomyolysis.   Plan: EGD on 07/19/17. Continue Protonix 40 mg daily. Nothing by mouth post midnight on 07/18/17.   Ronnette Juniper 07/17/2017, 12:49 PM   Pager [phone] If no answer or after 5 PM call [phone]

## 2017-07-17 NOTE — H&P (View-Only) (Signed)
Subjective: She was seen and examined at bedside. I'm told that she had one dark stools today, not black, formed and nonbloody. She has been able to tolerate regular food, denies difficulty swallowing, pain on swallowing or acid reflux. Denies abdominal pain currently.  Objective: Vital signs in last 24 hours: Temp:  [98.7 F (37.1 C)-99.2 F (37.3 C)] 98.8 F (37.1 C) (03/05 0407) Pulse Rate:  [80-81] 81 (03/05 0407) Resp:  [19-20] 20 (03/05 0407) BP: (110-126)/(45-66) 111/45 (03/05 0407) SpO2:  [90 %-100 %] 93 % (03/05 0824) Weight:  [72.6 kg (160 lb)] 72.6 kg (160 lb) (03/05 0408) Weight change: -0.907 kg () Last BM Date: 07/14/17  PE: Ill-appearing, appears older than stated age GENERAL: Mild pallor, no obvious icterus ABDOMEN: Soft, nontender EXTREMITIES: No deformity  Lab Results: Results for orders placed or performed during the hospital encounter of 07/10/17 (from the past 48 hour(s))  CK     Status: Abnormal   Collection Time: 07/16/17  6:07 AM  Result Value Ref Range   Total CK 4,815 (H) 38 - 234 U/L    Comment: RESULTS CONFIRMED BY MANUAL DILUTION Performed at [facility], [place] [address], [place], [place] [address]   Magnesium     Status: None   Collection Time: 07/16/17  6:07 AM  Result Value Ref Range   Magnesium 2.1 1.7 - 2.4 mg/dL    Comment: Performed at [facility], [place] [address] [place], [place] [address]  Comprehensive metabolic panel     Status: Abnormal   Collection Time: 07/16/17  6:07 AM  Result Value Ref Range   Sodium 143 135 - 145 mmol/L   Potassium 3.9 3.5 - 5.1 mmol/L   Chloride 96 (L) 101 - 111 mmol/L   CO2 34 (H) 22 - 32 mmol/L   Glucose, Bld 104 (H) 65 - 99 mg/dL   BUN 75 (H) 6 - 20 mg/dL   Creatinine, Ser 4.40 (H) 0.44 - 1.00 mg/dL   Calcium 9.0 8.9 - 10.3 mg/dL   Total Protein 5.1 (L) 6.5 - 8.1 g/dL   Albumin 2.1 (L) 3.5 - 5.0 g/dL   AST 290 (H) 15 - 41 U/L   ALT 464 (H) 14 - 54 U/L   Alkaline Phosphatase 79 38 - 126 U/L   Total Bilirubin 0.7 0.3 - 1.2 mg/dL   GFR calc non Af Amer 9 (L) >60 mL/min   GFR calc Af Amer 10 (L) >60 mL/min    Comment: (NOTE) The eGFR has been calculated using the CKD EPI equation. This calculation has not been validated in all clinical situations. eGFR's persistently <60 mL/min signify possible Chronic Kidney Disease.    Anion gap 13 5 - 15    Comment: Performed at [facility], [place] [address] [place], [place] [address]  CBC with Differential/Platelet     Status: Abnormal   Collection Time: 07/16/17  6:07 AM  Result Value Ref Range   WBC 8.0 4.0 - 10.5 K/uL   RBC 2.65 (L) 3.87 - 5.11 MIL/uL   Hemoglobin 8.4 (L) 12.0 - 15.0 g/dL   HCT 26.2 (L) 36.0 - 46.0 %   MCV 98.9 78.0 - 100.0 fL   MCH 31.7 26.0 - 34.0 pg   MCHC 32.1 30.0 - 36.0 g/dL   RDW 15.4 11.5 - 15.5 %   Platelets 360 150 - 400 K/uL   Neutrophils Relative % 70 %   Neutro Abs 5.6 1.7 - 7.7 K/uL  Lymphocytes Relative 17 %   Lymphs Abs 1.3 0.7 - 4.0 K/uL   Monocytes Relative 11 %   Monocytes Absolute 0.9 0.1 - 1.0 K/uL   Eosinophils Relative 2 %   Eosinophils Absolute 0.1 0.0 - 0.7 K/uL   Basophils Relative 0 %   Basophils Absolute 0.0 0.0 - 0.1 K/uL    Comment: Performed at [facility], [place] [address], [place], [place] [address]  Phosphorus     Status: Abnormal   Collection Time: 07/16/17  6:07 AM  Result Value Ref Range   Phosphorus 5.1 (H) 2.5 - 4.6 mg/dL    Comment: Performed at [facility], [place] [address] [place] [address], [place], [place] [address]  CK     Status: Abnormal   Collection Time: 07/17/17  6:07 AM  Result Value Ref Range   Total CK 3,229 (H) 38 - 234 U/L    Comment: Performed at [facility], [place] [address], [place], [place] [address]  Comprehensive metabolic panel     Status: Abnormal   Collection Time: 07/17/17  6:07 AM  Result Value Ref Range   Sodium 141 135 - 145 mmol/L    Potassium 3.8 3.5 - 5.1 mmol/L   Chloride 97 (L) 101 - 111 mmol/L   CO2 31 22 - 32 mmol/L   Glucose, Bld 102 (H) 65 - 99 mg/dL   BUN 76 (H) 6 - 20 mg/dL   Creatinine, Ser 4.03 (H) 0.44 - 1.00 mg/dL   Calcium 8.7 (L) 8.9 - 10.3 mg/dL   Total Protein 5.0 (L) 6.5 - 8.1 g/dL   Albumin 2.2 (L) 3.5 - 5.0 g/dL   AST 213 (H) 15 - 41 U/L   ALT 394 (H) 14 - 54 U/L   Alkaline Phosphatase 77 38 - 126 U/L   Total Bilirubin 0.7 0.3 - 1.2 mg/dL   GFR calc non Af Amer 10 (L) >60 mL/min   GFR calc Af Amer 11 (L) >60 mL/min    Comment: (NOTE) The eGFR has been calculated using the CKD EPI equation. This calculation has not been validated in all clinical situations. eGFR's persistently <60 mL/min signify possible Chronic Kidney Disease.    Anion gap 13 5 - 15    Comment: Performed at [facility], [place] [address], [place], [place] [address]  CBC with Differential/Platelet     Status: Abnormal   Collection Time: 07/17/17  6:07 AM  Result Value Ref Range   WBC 9.0 4.0 - 10.5 K/uL   RBC 2.47 (L) 3.87 - 5.11 MIL/uL   Hemoglobin 7.8 (L) 12.0 - 15.0 g/dL   HCT 24.6 (L) 36.0 - 46.0 %   MCV 99.6 78.0 - 100.0 fL   MCH 31.6 26.0 - 34.0 pg   MCHC 31.7 30.0 - 36.0 g/dL   RDW 15.4 11.5 - 15.5 %   Platelets 362 150 - 400 K/uL   Neutrophils Relative % 73 %   Neutro Abs 6.6 1.7 - 7.7 K/uL   Lymphocytes Relative 15 %   Lymphs Abs 1.3 0.7 - 4.0 K/uL   Monocytes Relative 10 %   Monocytes Absolute 0.9 0.1 - 1.0 K/uL   Eosinophils Relative 2 %   Eosinophils Absolute 0.2 0.0 - 0.7 K/uL   Basophils Relative 0 %   Basophils Absolute 0.0 0.0 - 0.1 K/uL    Comment: Performed at [facility], [place] [address] [place],  [address]  Magnesium     Status: None  Collection Time: 07/17/17  6:07 AM  Result Value Ref Range   Magnesium 2.1 1.7 - 2.4 mg/dL    Comment: Performed at [facility], [place] [address], [place], [place] [address]  Phosphorus      Status: Abnormal   Collection Time: 07/17/17  6:07 AM  Result Value Ref Range   Phosphorus 5.2 (H) 2.5 - 4.6 mg/dL    Comment: Performed at [facility], [place] [address], [place], [place] [address]    Studies/Results: No results found.  Medications: I have reviewed the patient's current medications.  Assessment: 1. Anemia and black stools and presentation suspicious for upper GI bleeding. Hemoglobin slightly trending down, 9.2/19/8.9/8.4/7.8.? Hemodilutional Iron saturation 33%, ferritin 121. 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  2. Rhabdomyolysis 3. Renal impairment 4. Abnormal LFTs(T bili normal at 0.7, AST trended down from 529-213, AST trended down from 597-394, normal alkaline phosphatase 77), likely secondary to rhabdomyolysis.   Plan: EGD on 07/19/17. Continue Protonix 40 mg daily. Nothing by mouth post midnight on 07/18/17.   Ronnette Juniper 07/17/2017, 12:49 PM   Pager [phone] If no answer or after 5 PM call [phone]

## 2017-07-17 NOTE — Progress Notes (Signed)
PROGRESS NOTE    Sara Fox  OHY:[id number] DOB: [birth year] DOA: 07/10/2017 PCP: [name] Chard, MD   Brief Narrative:  Sara Fox is a 77 y.o. female with medical history significant of HTN, HLD, systolic CHF last EF 94-85% with grade 1 dFx, CAD; who presents with weakness and confusion over the last 1 week.  Patient reports having diarrhea going multiple times per day with dark stools.  Last bowel movement noted yesterday night.  Family reported patient having intermittent confusion, with increased lethargy, and weakness.  Associated symptoms include complaints of right leg pain, lower abdominal pain, dysuria, and lower extremity swelling. Patient has been more sedimentary.  Last month she has suffered a left wrist fracture.  Denies any recent fever, shortness of breath, antibiotics, travel, or falls.  She was seen by her PCP today and blood work was obtained.  Patient was called back and advised to come in to the emergency department for further evaluation due to abnormal labs.  Upon admission into the emergency department patient was noted to be afebrile with vital signs relatively within normal limits. She was found to have Acute Renal Failure and also found to have Rhabdomyolysis. Nephrology Consulted and managing Fluids and now have stopped. She also has had Melena but Hb/Hct has been Stable. GI has been consulted and planning on EGD 07/19/17.  Nephrology restarting IVF at 1/2 NS at 100 mL/hr. Patient improving and having no complaints.    Assessment & Plan:   Principal Problem:   ARF (acute renal failure) (HCC) Active Problems:   Normocytic anemia   Bilateral lower extremity edema   Rhabdomyolysis   Melena   Hypoalbuminemia   Elevated LFTs   Malnutrition of moderate degree  Acute Kidney Injury/Acute Renal Failure likely multifactorial in the setting of Rhabdomyolysis and Likely ATN -Acute. Patient's baseline creatinine previously noted to be within normal limits, but she  presented with a BUN of 43 and creatinine 3.82.  - Urinalysis was positive for large hemoglobin but negative for RBCs given concern for rhabdomyolysis.   -Given patient taking diuretics suspect prerenal cause of symptoms and lack of mobility due to wrist fixation. -Admitted to telemetry bed  -Strict intake and output; Patient is +3.141 Liters  -Isotonic Sodium Bicarbonate at a rate of 150 mL/hr now stopped by Renal (Patient becoming Alkalotic) but restarted IVF as below  -Checked Abdominal ultrasound and showed no acute findings but mildly increased echotexture in the kidnesy bilaterally suggesting early chronic medical renal disease -Hold Nephrotoxic Agents -BUN/Cr peaked at 62/4.65 and improved to 76/4.03 -Urinalysis had 100 Protein  -Nephrology consulted and appreciate further evaluation and recommendations -C/w Foley Catheter for now and remove when Cr further improving  -Nephrology checking serologies for Acute Glomerulonephritis and checking ANA, IFA, MPO/Pr3 (ANCA) -Nephrology restarted IVF at 1/2 NS at 100 mL/hr and will continue for now   Acute Rhabdomyolysis -Acute. CK elevated at 40,213 on admission and now improved to 3,229 -IVF with Isotonic Bicarbonate to 150 mL/hr now D/C'd  -Check daily CPK   Melena -Acute.  Patient reports having dark diarrhea over the last 1-2 weeks; No episodes here -Stool guaiacs were ordered and noted to be weakly positive.   -No reported recent antibiotic use but has been taking Daily Ibuprofen.  Question possibility of upper GI bleed given elevated BUN. -Type and screen for possible need of blood products -Monitor H&H; Hb/Hct trending down from 9.7/30.0 -> 7.8/24.6 (?dilutional drop) -C/w Protonix 40 mg po Daily  -Gastroenterology consulted for further  evaluation and recommendations; GI planning on EGD on 07/19/17  Normocytic Normochromic Anemia -Hemoglobin noted to be 9.7 on admission. Baseline hemoglobin previously noted to be around  10-11. -Hb/Hct went from 9.7/30.0 -> 7.8/24.6 -Anemia Panel showed Iron Level of 69, UBIC of 138, TIBC of 207, Saturation Ratio of 33, and Ferritin of 121 -Continue to Monitor for S/Sx of Bleeding -Repeat CBC in AM    Acute Encephalopathy, improved -Suspected secondary to dehydration. -Neurochecks -If continues to persist will get Head CT w/o Contrast but will hold off for now  Hypokalemia, improved  -Acute.  Initial potassium noted to be 2.8 on admission and now improved to 3.8.  -Continue to monitor and Replete as needed -Repeat CMP in AM   History of Combined Systolic and Diastolic CHF -Last EF noted to be around 45-50% with grade 1 diastolic dysfunction in 5462. -Hold furosemide and lisinopril due to acute renal failure -Continue Metoprolol 50 mg po BIDas tolerated -Strict I's/O's, Daily Weights -Repeat ECHOCardiogram done this visit showed EF of 60-65% and Grade 1 DD -Placed Foley for accurate Monitoring and patient is +3.141.3 Liters  -IV Lasix given again by Nephrology  -Weight is down 18 lbs and was not done today   Lower Extremity Edema, improved -Patient with at least 2+ pitting lower extremity edema on Admission; LE Swelling has improved  -Symptoms likely could be secondary to hypoalbuminemia vs. Heart Failure  -Checked Doppler duplex ultrasound and was Negative for DVT -Continue to Monitor and seems to be improved with IV Diuresis   CAD -Status post DES stent placement and 09/2014. -Currently Holding Brilinta and Aspirin given Melena and will need to resume when its safe   Abnormal/Elevated LFTs, improving -Acute.   -Patient with significantly elevated AST and ALT which correlates with rhabdomyolysis;  -AST went from 1239 -> 213 -ALT went from 843 -> 394 -Levels should correct with IV fluids alone and now Trending down -Recheck CMP AM  Hypoalbuminemia -Acute.  Patient's initial albumin noted to be 2.7 on admission; Albumin now 2.2 -Checked prealbumin  this AM and was 16.3 -Dietician/Nutritionist Consult appreciated  Hyperlipidemia  -Continue to Hold Statin   Hyperphosphatemia -In the setting of Acute Renal Failure -Phos Level now 5.2 -Continue to Monitor Phos Levels and repeat in AM   Generalized Weakness -PT/OT Evaluation recommending SNF  Essential Hypertension -Home BP medications currently being Held (ACE-I) -C/w Metoprolol 50 mg po BID -Nephrology recommending beginning Procardia XL qHS if remains elevated -BP was 110/62 today   Moderate Malnutrition in the Context of Chronic Illness -Nutritionist consulted and appreciate Evaluation and recommendations -C/w Ensure Enlive 237 mL po BID  DVT prophylaxis: SCDs; Holding Pharmacological Prophylaxis given concern for Melena/GIB Code Status: FULL CODE Family Communication: Discussed with Daughter Disposition Plan: Remain Inpateint for Continued Workup and Treatment; PT Evaluation recommending SNF; EGD on Frdiay week  Consultants:   Nephrology  Gastroenterology    Procedures:  Bilateral Lower Extremity Duplex Negative for obvious evidence of DVT.  ECHOCARDIOGRAM 07/12/17 ------------------------------------------------------------------- Study Conclusions  - Left ventricle: The cavity size was normal. There was mild   concentric hypertrophy. Systolic function was normal. The   estimated ejection fraction was in the range of 60% to 65%. Wall   motion was normal; there were no regional wall motion   abnormalities. Doppler parameters are consistent with abnormal   left ventricular relaxation (grade 1 diastolic dysfunction). - Mitral valve: Calcified annulus. - Atrial septum: No defect or patent foramen ovale was identified.   Antimicrobials:  Anti-infectives (From admission, onward)   None     Subjective: Seen and examined at bedside and was doing better and had just eaten. No CP or SOB   Objective: Vitals:   07/16/17 2229 07/17/17 0407 07/17/17 0408  07/17/17 0824  BP:  (!) 111/45    Pulse:  81    Resp:  20    Temp:  98.8 F (37.1 C)    TempSrc:  Axillary    SpO2: 90% 90%  93%  Weight:   72.6 kg (160 lb)   Height:        Intake/Output Summary (Last 24 hours) at 07/17/2017 1455 Last data filed at 07/17/2017 1130 Gross per 24 hour  Intake 720 ml  Output 550 ml  Net 170 ml   Filed Weights   07/14/17 1643 07/16/17 0647 07/17/17 0408  Weight: 65.5 kg (144 lb 6.4 oz) 73.5 kg (162 lb) 72.6 kg (160 lb)   Examination: Physical Exam:  Constitutional: Obese AAF who is WN/WD in NAD; Eating lunch in chair bedside Eyes: Sclerae anicteric; Lids normal ENMT: External Ears and Nose appear normal Neck: Supple with no JVD Respiratory: Diminished with unlabored breathing.  Cardiovascular: RRR; S1 S2; No LE noted today Abdomen: Soft, NT, ND. Bowel sounds present GU: Deferred Musculoskeletal: No contractures; No cyanosis Skin: Warm and Dry; No appreciable rashes noted Neurologic: CN 2-12 grossly intact. No appreciable focal deficits Psychiatric: Awake and alert. Normal mood and affect.  Data Reviewed: I have personally reviewed following labs and imaging studies  CBC: Recent Labs  Lab 07/13/17 0549 07/14/17 0533 07/15/17 0553 07/16/17 0607 07/17/17 0607  WBC 10.1 10.3 9.5 8.0 9.0  NEUTROABS 8.4* 8.4* 7.3 5.6 6.6  HGB 9.2* 9.0* 8.9* 8.4* 7.8*  HCT 26.9* 26.7* 27.6* 26.2* 24.6*  MCV 93.7 93.0 97.5 98.9 99.6  PLT 279 308 333 360 945   Basic Metabolic Panel: Recent Labs  Lab 07/13/17 0549 07/14/17 0533 07/15/17 0553 07/16/17 0607 07/17/17 0607  NA 140 140 137 143 141  K 3.2* 3.1* 3.7 3.9 3.8  CL 104 95* 92* 96* 97*  CO2 23 32 33* 34* 31  GLUCOSE 123* 117* 102* 104* 102*  BUN 62* 69* 69* 75* 76*  CREATININE 4.65* 4.62* 4.45* 4.40* 4.03*  CALCIUM 8.1* 8.5* 8.7* 9.0 8.7*  MG 2.1 2.1 2.1 2.1 2.1  PHOS 4.9* 4.7* 4.9* 5.1* 5.2*   GFR: Estimated Creatinine Clearance: 11.4 mL/min (A) (by C-G formula based on SCr of 4.03  mg/dL (H)). Liver Function Tests: Recent Labs  Lab 07/13/17 0549 07/14/17 0533 07/15/17 0553 07/16/17 0607 07/17/17 0607  AST 529* 525* 435* 290* 213*  ALT 583* 597* 568* 464* 394*  ALKPHOS 91 91 87 79 77  BILITOT 0.5 0.5 0.5 0.7 0.7  PROT 4.7* 4.7* 5.0* 5.1* 5.0*  ALBUMIN 2.0* 2.0* 2.0* 2.1* 2.2*   No results for input(s): LIPASE, AMYLASE in the last 168 hours. No results for input(s): AMMONIA in the last 168 hours. Coagulation Profile: No results for input(s): INR, PROTIME in the last 168 hours. Cardiac Enzymes: Recent Labs  Lab 07/13/17 0549 07/14/17 0533 07/15/17 0553 07/16/17 0607 07/17/17 0607  CKTOTAL 11,328* 12,940* 8,943* 4,815* 3,229*   BNP (last 3 results) No results for input(s): PROBNP in the last 8760 hours. HbA1C: No results for input(s): HGBA1C in the last 72 hours. CBG: No results for input(s): GLUCAP in the last 168 hours. Lipid Profile: No results for input(s): CHOL, HDL, LDLCALC, TRIG, CHOLHDL, LDLDIRECT in the last 72  hours. Thyroid Function Tests: No results for input(s): TSH, T4TOTAL, FREET4, T3FREE, THYROIDAB in the last 72 hours. Anemia Panel: No results for input(s): VITAMINB12, FOLATE, FERRITIN, TIBC, IRON, RETICCTPCT in the last 72 hours. Sepsis Labs: No results for input(s): PROCALCITON, LATICACIDVEN in the last 168 hours.  No results found for this or any previous visit (from the past 240 hour(s)).   Radiology Studies: No results found. Scheduled Meds: . feeding supplement (ENSURE ENLIVE)  237 mL Oral BID BM  . gabapentin  100 mg Oral QHS  . metoprolol tartrate  50 mg Oral BID WC  . mometasone-formoterol  2 puff Inhalation BID  . pantoprazole  40 mg Oral Daily   Continuous Infusions: . sodium chloride 100 mL/hr at 07/17/17 0408    LOS: 6 days   Kerney Elbe, DO [facility] Pager [phone]  If 7PM-7AM, please contact night-coverage [email or web address] Password [email or web address] 07/17/2017, 2:55 PM

## 2017-07-17 NOTE — Progress Notes (Signed)
Physical Therapy Treatment Patient Details Name: Sara Fox MRN: [id number] DOB: 1940-10-07 Today's Date: 07/17/2017    History of Present Illness 77 yo female admitted with weakness, confusion, diarrhea, ARF. hx of CHF, HTN, CAD, falls, L wrist fx?    PT Comments    Assisted OOB to [facility] to void then amb in hallway a limited distance with recliner following closely behine as B knees buckled without notice.   Follow Up Recommendations  SNF     Equipment Recommendations  None recommended by PT    Recommendations for Other Services       Precautions / Restrictions Precautions Precautions: Fall Precaution Comments: B knees buckle without notice Restrictions Weight Bearing Restrictions: No    Mobility  Bed Mobility Overal bed mobility: Needs Assistance Bed Mobility: Supine to Sit     Supine to sit: Mod assist;Min assist     General bed mobility comments: severe posterior lean with difficulty self correcting  Transfers Overall transfer level: Needs assistance Equipment used: None Transfers: Sit to/from [employer] Transfers Sit to Stand: Mod assist;+2 safety/equipment Stand pivot transfers: Mod assist;+2 safety/equipment       General transfer comment: + 2 assist for safety esp with turns and B knees buckle without notice  Ambulation/Gait Ambulation/Gait assistance: Mod assist;+2 physical assistance;+2 safety/equipment Ambulation Distance (Feet): 16 Feet Assistive device: Rolling walker (2 wheeled) Gait Pattern/deviations: Step-to pattern;Step-through pattern;Trunk flexed;Narrow base of support Gait velocity: decreased   General Gait Details: very short tight steps with B knees tight.  Recliner following for safety.   Stairs            Wheelchair Mobility    Modified Rankin (Stroke Patients Only)       Balance                                            Cognition Arousal/Alertness: Awake/alert Behavior During Therapy:  WFL for tasks assessed/performed Overall Cognitive Status: Within Functional Limits for tasks assessed                                 General Comments: AxO X 4 pleasant lady      Exercises      General Comments        Pertinent Vitals/Pain Pain Assessment: Faces Faces Pain Scale: Hurts a little bit Pain Location: R knee    Home Living                      Prior Function            PT Goals (current goals can now be found in the care plan section) Progress towards PT goals: Progressing toward goals    Frequency    Min 3X/week      PT Plan Current plan remains appropriate    Co-evaluation              AM-PAC PT "6 Clicks" Daily Activity  Outcome Measure  Difficulty turning over in bed (including adjusting bedclothes, sheets and blankets)?: A Lot Difficulty moving from lying on back to sitting on the side of the bed? : A Lot Difficulty sitting down on and standing up from a chair with arms (e.g., wheelchair, bedside commode, etc,.)?: A Lot Help needed moving to and from a bed to chair (including  a wheelchair)?: A Lot Help needed walking in hospital room?: A Lot Help needed climbing 3-5 steps with a railing? : Total 6 Click Score: 11    End of Session Equipment Utilized During Treatment: Gait belt Activity Tolerance: Patient limited by fatigue Patient left: in chair;with call bell/phone within reach;with chair alarm set Nurse Communication: Mobility status PT Visit Diagnosis: Muscle weakness (generalized) (M62.81);Difficulty in walking, not elsewhere classified (R26.2);Other abnormalities of gait and mobility (R26.89)     Time: 7425-9563 PT Time Calculation (min) (ACUTE ONLY): 16 min  Charges:  $Gait Training: 8-22 mins                    G Codes:       {Gwenna Fuston  PTA [facility]  Acute  Rehab Pager      [phone]

## 2017-07-17 NOTE — Progress Notes (Signed)
Patient ID: Sara Fox, female   DOB: 19-Apr-1941, 77 y.o.   MRN: [id number] [facility] Progress Note   Assessment/ Plan:   1. Acute kidney injury: Suspect that this is multifactorial from rhabdomyolysis and ACEi effects resulting in ATN.  Creat down some today , continue IVF"s. 2. Rhabdomyolysis: Suspected to be secondary to recent falls/bedridden state and ongoing statin use.  CPK down to 3000.  3. Anemia: Seen by gastroenterology and their plans for expectant management noted. Iron stores replete.  4. Hypertension: Blood pressure currently acceptable, monitor with intermittent furosemide dosing. Holding home lisinopril w. AKI.  5. Met alkalosis: from bicarb drip which is now dc'd, cont 1/2 NS for now.   Kelly Splinter MD [employer] pgr [phone]   07/17/2017, 2:59 PM    Subjective:   No new c/o's, in good spirits, in the chair, no family here today.    Objective:   BP (!) 111/45 (BP Location: Right Arm) Comment: nurse notified  Pulse 81   Temp 98.8 F (37.1 C) (Axillary)   Resp 20   Ht _0  (1.626 m)   Wt 72.6 kg (160 lb)   SpO2 93%   BMI 27.46 kg/m   Intake/Output Summary (Last 24 hours) at 07/17/2017 1459 Last data filed at 07/17/2017 1130 Gross per 24 hour  Intake 720 ml  Output 550 ml  Net 170 ml   Weight change: -0.907 kg ()  Physical Exam: Gen: Comfortably resting in bed, dry mouth CVS: Pulse regular rhythm, normal rate, S1 and S2 normal Resp: Coarse/transmitted breath sounds bilaterally without distinct rales or rhonchi Abd: Soft, flat, nontender Ext: no LE edema  Imaging: No results found.  Labs: BMET Recent Labs  Lab 07/11/17 0527 07/12/17 3244 07/13/17 0549 07/14/17 0533 07/15/17 0553 07/16/17 0607 07/17/17 0607  NA 138 141 140 140 137 143 141  K 3.2* 3.2* 3.2* 3.1* 3.7 3.9 3.8  CL 109 110 104 95* 92* 96* 97*  CO2 17* 20* 23 32 33* 34* 31  GLUCOSE 79 111* 123* 117* 102* 104* 102*  BUN 47* 56* 62* 69* 69* 75*  76*  CREATININE 3.92* 4.51* 4.65* 4.62* 4.45* 4.40* 4.03*  CALCIUM 7.6* 8.1* 8.1* 8.5* 8.7* 9.0 8.7*  PHOS  --  5.2* 4.9* 4.7* 4.9* 5.1* 5.2*   CBC Recent Labs  Lab 07/14/17 0533 07/15/17 0553 07/16/17 0607 07/17/17 0607  WBC 10.3 9.5 8.0 9.0  NEUTROABS 8.4* 7.3 5.6 6.6  HGB 9.0* 8.9* 8.4* 7.8*  HCT 26.7* 27.6* 26.2* 24.6*  MCV 93.0 97.5 98.9 99.6  PLT 308 333 360 362    Medications:    . feeding supplement (ENSURE ENLIVE)  237 mL Oral BID BM  . gabapentin  100 mg Oral QHS  . metoprolol tartrate  50 mg Oral BID WC  . mometasone-formoterol  2 puff Inhalation BID  . pantoprazole  40 mg Oral Daily   Elmarie Shiley, MD 07/17/2017, 2:59 PM

## 2017-07-18 DIAGNOSIS — L8990 Pressure ulcer of unspecified site, unspecified stage: Secondary | ICD-10-CM

## 2017-07-18 DIAGNOSIS — N179 Acute kidney failure, unspecified: Secondary | ICD-10-CM

## 2017-07-18 LAB — COMPREHENSIVE METABOLIC PANEL
ALT: 348 U/L — ABNORMAL HIGH (ref 14–54)
AST: 152 U/L — ABNORMAL HIGH (ref 15–41)
Albumin: 2.3 g/dL — ABNORMAL LOW (ref 3.5–5.0)
Alkaline Phosphatase: 75 U/L (ref 38–126)
Anion gap: 13 (ref 5–15)
BUN: 72 mg/dL — ABNORMAL HIGH (ref 6–20)
CO2: 28 mmol/L (ref 22–32)
Calcium: 8.7 mg/dL — ABNORMAL LOW (ref 8.9–10.3)
Chloride: 99 mmol/L — ABNORMAL LOW (ref 101–111)
Creatinine, Ser: 3.76 mg/dL — ABNORMAL HIGH (ref 0.44–1.00)
GFR calc Af Amer: 12 mL/min — ABNORMAL LOW (ref >60)
GFR calc non Af Amer: 11 mL/min — ABNORMAL LOW (ref >60)
Glucose, Bld: 101 mg/dL — ABNORMAL HIGH (ref 65–99)
Potassium: 3.5 mmol/L (ref 3.5–5.1)
Sodium: 140 mmol/L (ref 135–145)
Total Bilirubin: 0.7 mg/dL (ref 0.3–1.2)
Total Protein: 5.1 g/dL — ABNORMAL LOW (ref 6.5–8.1)

## 2017-07-18 LAB — CBC WITH DIFFERENTIAL/PLATELET
Basophils Absolute: 0 10*3/uL (ref 0.0–0.1)
Basophils Relative: 0 %
Eosinophils Absolute: 0.1 10*3/uL (ref 0.0–0.7)
Eosinophils Relative: 1 %
HCT: 23.6 % — ABNORMAL LOW (ref 36.0–46.0)
Hemoglobin: 7.5 g/dL — ABNORMAL LOW (ref 12.0–15.0)
Lymphocytes Relative: 14 %
Lymphs Abs: 1.3 10*3/uL (ref 0.7–4.0)
MCH: 31.6 pg (ref 26.0–34.0)
MCHC: 31.8 g/dL (ref 30.0–36.0)
MCV: 99.6 fL (ref 78.0–100.0)
Monocytes Absolute: 1 10*3/uL (ref 0.1–1.0)
Monocytes Relative: 11 %
Neutro Abs: 6.9 10*3/uL (ref 1.7–7.7)
Neutrophils Relative %: 74 %
Platelets: 317 10*3/uL (ref 150–400)
RBC: 2.37 MIL/uL — ABNORMAL LOW (ref 3.87–5.11)
RDW: 15.6 % — ABNORMAL HIGH (ref 11.5–15.5)
WBC: 9.4 10*3/uL (ref 4.0–10.5)

## 2017-07-18 LAB — CK: Total CK: 2050 U/L — ABNORMAL HIGH (ref 38–234)

## 2017-07-18 LAB — PHOSPHORUS: Phosphorus: 5 mg/dL — ABNORMAL HIGH (ref 2.5–4.6)

## 2017-07-18 LAB — MAGNESIUM: Magnesium: 2 mg/dL (ref 1.7–2.4)

## 2017-07-18 MED ORDER — SODIUM CHLORIDE 0.9 % IV SOLN: INTRAVENOUS | Status: DC

## 2017-07-18 NOTE — Progress Notes (Addendum)
Patient ID: Sara Fox, female   DOB: Oct 12, 1940, 77 y.o.   MRN: [id number] [facility] Progress Note   Assessment/ Plan:   1. Acute kidney injury: Suspect that this is multifactorial from rhabdomyolysis and ACEi effects resulting in ATN.  Creat improving, down to 3.7 today.  Decrease IVf to 75/hr, f/u creat in am.   2. Rhabdomyolysis: Suspected to be secondary to recent falls/bedridden state and ongoing statin use.  CPK down < 5000  3. Anemia: Seen by gastroenterology and their plans for expectant management noted. Iron stores replete.  4. Hypertension: Blood pressure currently acceptable.  Holding home lisinopril w. AKI.  5. Met alkalosis: from bicarb drip which is now dc'd, cont 1/2 NS for now.   Kelly Splinter MD [employer] pgr [phone]   07/18/2017, 2:52 PM    Subjective:   No new c/o's, in good spirits, in the chair, no family here today.    Objective:   BP 103/65 (BP Location: Right Arm)   Pulse 84   Temp 99 F (37.2 C) (Oral)   Resp 18   Ht '5\' 4"'$  (1.626 m)   Wt 70.8 kg (156 lb)   SpO2 99%   BMI 26.78 kg/m   Intake/Output Summary (Last 24 hours) at 07/18/2017 1452 Last data filed at 07/18/2017 0500 Gross per 24 hour  Intake 1200 ml  Output 1000 ml  Net 200 ml   Weight change: -1.814 kg ()  Physical Exam: Gen: Comfortably resting in bed, dry mouth CVS: Pulse regular rhythm, normal rate, S1 and S2 normal Resp: Coarse/transmitted breath sounds bilaterally without distinct rales or rhonchi Abd: Soft, flat, nontender Ext: no LE edema  Imaging: No results found.  Labs: BMET Recent Labs  Lab 07/12/17 0528 07/13/17 0549 07/14/17 0533 07/15/17 0553 07/16/17 0607 07/17/17 0607 07/18/17 0620  NA 141 140 140 137 143 141 140  K 3.2* 3.2* 3.1* 3.7 3.9 3.8 3.5  CL 110 104 95* 92* 96* 97* 99*  CO2 20* 23 32 33* 34* 31 28  GLUCOSE 111* 123* 117* 102* 104* 102* 101*  BUN 56* 62* 69* 69* 75* 76* 72*  CREATININE 4.51* 4.65* 4.62*  4.45* 4.40* 4.03* 3.76*  CALCIUM 8.1* 8.1* 8.5* 8.7* 9.0 8.7* 8.7*  PHOS 5.2* 4.9* 4.7* 4.9* 5.1* 5.2* 5.0*   CBC Recent Labs  Lab 07/15/17 0553 07/16/17 0607 07/17/17 0607 07/18/17 0620  WBC 9.5 8.0 9.0 9.4  NEUTROABS 7.3 5.6 6.6 6.9  HGB 8.9* 8.4* 7.8* 7.5*  HCT 27.6* 26.2* 24.6* 23.6*  MCV 97.5 98.9 99.6 99.6  PLT 333 360 362 317    Medications:    . feeding supplement (ENSURE ENLIVE)  237 mL Oral BID BM  . gabapentin  100 mg Oral QHS  . metoprolol tartrate  50 mg Oral BID WC  . mometasone-formoterol  2 puff Inhalation BID  . pantoprazole  40 mg Oral Daily

## 2017-07-18 NOTE — Progress Notes (Signed)
  PROGRESS NOTE  Sara Fox PPI:[id number] DOB: 14-Apr-1941 DOA: 07/10/2017 PCP: [name] Chard, MD  Brief Narrative: 77 year old woman presenting with weakness, confusion, diarrhea, lethargy, right leg pain and multiple other complaints.  Was seen by her primary care physician in the office and sent to the emergency department after blood work revealed abnormalities.  Admitted for acute kidney injury, acute rhabdomyolysis, possible GI bleed, acute encephalopathy, elevated LFTs.  Assessment/Plan Acute kidney injury.  Multifactorial, ATN possibly hemodynamically mediated, rhabdomyolysis, ACE inhibitor, complicated by ibuprofen use.  Renal ultrasound suggested early chronic medical renal disease. --Renal function is slowly improving.  Continue management per nephrology.  Rhabdomyolysis, thought to be secondary to falls, statin use.  Associated transaminitis improving. -CK rapidly trending down.  No further specific intervention or follow-up.  Melena, low volume, prior to admission. -GI plans endoscopy 3/7.  No evidence of significant bleeding this hospitalization.  Normocytic anemia -Hemoglobin is trending down over this admission but there is been no frank bleeding.  May be dilution.  Plan as above.  Grade 1 diastolic dysfunction.  Moderate malnutrition --Per dietitian   DVT prophylaxis: SCDs Code Status: full Family Communication: none Disposition Plan: home when renal function significantly improved    Murray Hodgkins, MD  [facility] Direct contact: [phone] --[place] [place]  --[email or web address]; password [email or web address]  7PM-7AM contact night coverage as above 07/18/2017, 4:23 PM  LOS: 7 days   Consultants:  Nephrology  Gastroenterology  Procedures:  Echo  Study Conclusions  - Left ventricle: The cavity size was normal. There was mild   concentric hypertrophy. Systolic function was normal. The   estimated ejection fraction was in the range of 60% to 65%. Wall  motion was normal; there were no regional wall motion   abnormalities. Doppler parameters are consistent with abnormal   left ventricular relaxation (grade 1 diastolic dysfunction). - Mitral valve: Calcified annulus. - Atrial septum: No defect or patent foramen ovale was identified.  Antimicrobials:    Interval history/Subjective: Feels okay.  Eating okay.  No bleeding noted.  Objective: Vitals:  Vitals:   07/18/17 0951 07/18/17 1415  BP:  103/65  Pulse:  84  Resp:  18  Temp:  99 F (37.2 C)  SpO2: 96% 99%    Exam:  Constitutional:  . Appears calm and comfortable sitting in chair. Respiratory:  . CTA bilaterally, no w/r/r.  . Respiratory effort normal Cardiovascular:  . RRR, no m/r/g . No LE extremity edema   Psychiatric:  . Mental status o Mood, affect appropriate  I have personally reviewed the following:   Labs:  BUN improving, 72, creatinine trending down, 3.76.  Magnesium within normal limits.  AST, ALT continue to trend downwards.  Total CK continues to trend downwards, 2050.  Hemoglobin remained stable, 7.5.  Scheduled Meds: . feeding supplement (ENSURE ENLIVE)  237 mL Oral BID BM  . gabapentin  100 mg Oral QHS  . metoprolol tartrate  50 mg Oral BID WC  . mometasone-formoterol  2 puff Inhalation BID  . pantoprazole  40 mg Oral Daily   Continuous Infusions: . sodium chloride 75 mL/hr at 07/18/17 1459    Principal Problem:   AKI (acute kidney injury) ([place]) Active Problems:   Normocytic anemia   Rhabdomyolysis   Melena   Hypoalbuminemia   Elevated LFTs   Malnutrition of moderate degree   Pressure injury of skin   LOS: 7 days

## 2017-07-18 NOTE — Progress Notes (Signed)
Nutrition Follow-up  DOCUMENTATION CODES:   Non-severe (moderate) malnutrition in context of chronic illness  INTERVENTION:   Ensure Enlive po BID, each supplement provides 350 kcal and 20 grams of protein  NUTRITION DIAGNOSIS:   Moderate Malnutrition related to chronic illness(CHF) as evidenced by energy intake < 75% for > or equal to 1 month, mild fat depletion, moderate muscle depletion, edema.  Ongoing  GOAL:   Patient will meet greater than or equal to 90% of their needs  Progressing  MONITOR:   PO intake, Supplement acceptance, Weight trends, Labs  REASON FOR ASSESSMENT:   Consult Assessment of nutrition requirement/status  ASSESSMENT:   Pt with PMH significant for HLD, HTN, CHF, and CAD. Presents this admission with complaints of weakness and dark stools x 1 week. Pt admitted for AKI, ARF, and acute rhabdomyolysis.    Pt reports appetite is progressing back to baseliner. Meal completions charted as 50-100% for her last eight meals. Pt had cream of wheat, omelet, and fruit for breakfast without any complication. Pt states sometimes when she lays back after eating it feels like she is choking. Encouraged pt to sit up in bed during and after meals. Pt would like to continue Ensure twice per day. Weight noted to decrease 6 lb since last RD visit (162 lb to 156 lb).   Medications reviewed and include: NS @ 100 ml/hr Labs reviewed: BUN 72 (H) Creatinine 3.76 (H) Phosphorus 5.0 (H) AST 152 (H) ALT 348 (H)   Diet Order:  Diet Heart Room service appropriate? Yes; Fluid consistency: Thin Diet NPO time specified  EDUCATION NEEDS:   Not appropriate for education at this time  Skin:  Skin Assessment: Reviewed RN Assessment  Last BM:  07/17/17  Height:   Ht Readings from Last 1 Encounters:  07/12/17 5\' 4"  (1.626 m)    Weight:   Wt Readings from Last 1 Encounters:  07/18/17 156 lb (70.8 kg)    Ideal Body Weight:  54.5 kg  BMI:  Body mass index is 26.78  kg/m.  Estimated Nutritional Needs:   Kcal:  1700-1900 kcal/day  Protein:  85-95 g/day  Fluid:  >1.7 L/day    Mariana Single RD, LDN Clinical Nutrition Pager # - [phone]

## 2017-07-19 ENCOUNTER — Encounter (HOSPITAL_COMMUNITY): Payer: Self-pay

## 2017-07-19 ENCOUNTER — Inpatient Hospital Stay (HOSPITAL_COMMUNITY): Payer: Medicare Other | Admitting: Anesthesiology

## 2017-07-19 ENCOUNTER — Encounter (HOSPITAL_COMMUNITY)
Admission: EM | Disposition: A | Discharge status: Skilled Nursing Facility | Payer: Self-pay | Source: Home / Self Care | Attending: Internal Medicine

## 2017-07-19 ENCOUNTER — Ambulatory Visit: Payer: Medicare Other | Admitting: Physical Medicine & Rehabilitation

## 2017-07-19 HISTORY — PX: ESOPHAGOGASTRODUODENOSCOPY (EGD) WITH PROPOFOL: SHX5813

## 2017-07-19 LAB — BASIC METABOLIC PANEL
Anion gap: 12 (ref 5–15)
BUN: 69 mg/dL — ABNORMAL HIGH (ref 6–20)
CO2: 26 mmol/L (ref 22–32)
Calcium: 8.5 mg/dL — ABNORMAL LOW (ref 8.9–10.3)
Chloride: 101 mmol/L (ref 101–111)
Creatinine, Ser: 3.37 mg/dL — ABNORMAL HIGH (ref 0.44–1.00)
GFR calc Af Amer: 14 mL/min — ABNORMAL LOW (ref >60)
GFR calc non Af Amer: 12 mL/min — ABNORMAL LOW (ref >60)
Glucose, Bld: 98 mg/dL (ref 65–99)
Potassium: 3.3 mmol/L — ABNORMAL LOW (ref 3.5–5.1)
Sodium: 139 mmol/L (ref 135–145)

## 2017-07-19 LAB — HEMOGLOBIN AND HEMATOCRIT, BLOOD
HCT: 25.3 % — ABNORMAL LOW (ref 36.0–46.0)
Hemoglobin: 8 g/dL — ABNORMAL LOW (ref 12.0–15.0)

## 2017-07-19 LAB — CBC
HCT: 21.3 % — ABNORMAL LOW (ref 36.0–46.0)
Hemoglobin: 6.9 g/dL — CL (ref 12.0–15.0)
MCH: 32.4 pg (ref 26.0–34.0)
MCHC: 32.4 g/dL (ref 30.0–36.0)
MCV: 100 fL (ref 78.0–100.0)
Platelets: 332 10*3/uL (ref 150–400)
RBC: 2.13 MIL/uL — ABNORMAL LOW (ref 3.87–5.11)
RDW: 15.3 % (ref 11.5–15.5)
WBC: 9.9 10*3/uL (ref 4.0–10.5)

## 2017-07-19 LAB — PREPARE RBC (CROSSMATCH)

## 2017-07-19 SURGERY — ESOPHAGOGASTRODUODENOSCOPY (EGD) WITH PROPOFOL
Anesthesia: Monitor Anesthesia Care | Laterality: Left

## 2017-07-19 MED ORDER — FLUCONAZOLE 100 MG PO TABS
100.0000 mg | ORAL_TABLET | Freq: Every day | ORAL | Status: DC
  Administered 2017-07-19 – 2017-07-20 (×2): 100 mg via ORAL
  Filled 2017-07-19 (×2): qty 1

## 2017-07-19 MED ORDER — PANTOPRAZOLE SODIUM 40 MG PO TBEC
40.0000 mg | DELAYED_RELEASE_TABLET | Freq: Two times a day (BID) | ORAL | Status: DC
  Administered 2017-07-19 – 2017-07-20 (×3): 40 mg via ORAL
  Filled 2017-07-19 (×3): qty 1

## 2017-07-19 MED ORDER — VITAMIN C 500 MG PO TABS
500.0000 mg | ORAL_TABLET | Freq: Every day | ORAL | Status: DC
  Administered 2017-07-19 – 2017-07-20 (×2): 500 mg via ORAL
  Filled 2017-07-19 (×2): qty 1

## 2017-07-19 MED ORDER — PROPOFOL 10 MG/ML IV BOLUS
INTRAVENOUS | Status: DC | PRN
  Administered 2017-07-19: 40 mg via INTRAVENOUS
  Administered 2017-07-19: 20 mg via INTRAVENOUS

## 2017-07-19 MED ORDER — POTASSIUM CHLORIDE 10 MEQ/100ML IV SOLN
10.0000 meq | INTRAVENOUS | Status: AC
  Administered 2017-07-19 (×2): 10 meq via INTRAVENOUS
  Filled 2017-07-19 (×3): qty 100

## 2017-07-19 MED ORDER — PROPOFOL 500 MG/50ML IV EMUL
INTRAVENOUS | Status: DC | PRN
  Administered 2017-07-19: 100 ug/kg/min via INTRAVENOUS

## 2017-07-19 MED ORDER — PROPOFOL 10 MG/ML IV BOLUS
INTRAVENOUS | Status: AC
  Filled 2017-07-19: qty 40

## 2017-07-19 MED ORDER — OXYBUTYNIN CHLORIDE 5 MG PO TABS
2.5000 mg | ORAL_TABLET | Freq: Two times a day (BID) | ORAL | Status: DC
  Administered 2017-07-19 – 2017-07-20 (×3): 2.5 mg via ORAL
  Filled 2017-07-19 (×3): qty 1

## 2017-07-19 MED ORDER — LIDOCAINE 2% (20 MG/ML) 5 ML SYRINGE
INTRAMUSCULAR | Status: DC | PRN
  Administered 2017-07-19: 100 mg via INTRAVENOUS

## 2017-07-19 MED ORDER — SODIUM CHLORIDE 0.9 % IV SOLN
Freq: Once | INTRAVENOUS | Status: AC
  Administered 2017-07-19: 10:00:00 via INTRAVENOUS

## 2017-07-19 MED ORDER — ONDANSETRON HCL 4 MG/2ML IJ SOLN
INTRAMUSCULAR | Status: DC | PRN
  Administered 2017-07-19: 4 mg via INTRAVENOUS

## 2017-07-19 NOTE — Op Note (Signed)
EGD was performed for anemia and dark stools on admission.  Findings: Patchy candidiasis found in middle and lower third of esophagus. Z line appeared normal at 38 cm. Large amount of food residue found in the gastric body making visualization difficult. Gastrojejunostomy site identified,presence of ulcers-small, superficial and clean based, around the margins, without associated stricturing noted. Scope was advanced into the jejunum without resistance, jejunum appeared unremarkable. Retroflexion showed a normal cardia and fundus. Rest of the gastric cavity could not be evaluated well as visualization was obscured with large retained food material.  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   Recommendations: Diflucan 100 mg by mouth daily for 7 days. Protonix 40 mg twice a day. Repeat endoscopy in 2 months to assess disease activity.  Low fiber, low residue diet. H&H monitoring and transfusion as needed.  Please recall GI if needed.  Ronnette Juniper, M.D.

## 2017-07-19 NOTE — Progress Notes (Signed)
LCSW following for NSF placement.  Patient not medically stable for dc today.   LCSW will continue to follow for disposition.  Carolin Coy [facility] [place]

## 2017-07-19 NOTE — Interval H&P Note (Signed)
History and Physical Interval Note:  76/female with anemia and dark stools on admission, history of Billroth II for EGD 07/19/2017 10:21 AM  Sara Fox  has presented today for EGD, with the diagnosis of anemia, black stools  The various methods of treatment have been discussed with the patient and family. After consideration of risks, benefits and other options for treatment, the patient has consented to  Procedure(s): ESOPHAGOGASTRODUODENOSCOPY (EGD) WITH PROPOFOL (Left) as a surgical intervention .  The patient's history has been reviewed, patient examined, no change in status, stable for surgery.  I have reviewed the patient's chart and labs.  Questions were answered to the patient's satisfaction.     Sara Fox

## 2017-07-19 NOTE — Op Note (Signed)
[facility] Patient Name: Sara Fox Procedure Date: 07/19/2017 MRN: [id number] Attending MD: Ronnette Juniper , MD Date of Birth: October 30, 1940 CSN: [id number] Age: 77 Admit Type: Inpatient Procedure:                Upper GI endoscopy Indications:              Unexplained iron deficiency anemia Providers:                Ronnette Juniper, MD, Angus Seller, William Dalton,                            Technician Referring MD:              Medicines:                Monitored Anesthesia Care Complications:            No immediate complications. Estimated Blood Loss:     Estimated blood loss: none. Procedure:                Pre-Anesthesia Assessment:                           - Prior to the procedure, a History and Physical                            was performed, and patient medications and                            allergies were reviewed. The patient's tolerance of                            previous anesthesia was also reviewed. The risks                            and benefits of the procedure and the sedation                            options and risks were discussed with the patient.                            All questions were answered, and informed consent                            was obtained. Prior Anticoagulants: The patient has                            taken no previous anticoagulant or antiplatelet                            agents. ASA Grade Assessment: IV - A patient with                            severe systemic disease that is a constant threat  to life. After reviewing the risks and benefits,                            the patient was deemed in satisfactory condition to                            undergo the procedure.                           After obtaining informed consent, the endoscope was                            passed under direct vision. Throughout the                            procedure, the patient's blood pressure,  pulse, and                            oxygen saturations were monitored continuously. The                            ([id number]) [id number] was introduced through the                            mouth, and advanced to the jejunum. The upper GI                            endoscopy was accomplished without difficulty. The                            patient tolerated the procedure well. Scope In: Scope Out: Findings:      Patchy candidiasis was found in the middle third of the esophagus and in       the lower third of the esophagus.      The Z-line was regular and was found 38 cm from the incisors.      Evidence of a gastric bypass was found in the gastric body. This was       characterized by erythema, inflammation and ulceration.      A large amount of food (residue) was found in the gastric body.      The cardia and gastric fundus were normal on retroflexion.      The examined jejunum was normal.      Previous EGD from 2012 stated that patient has Billroth II       gastrojenostomy. Due to large amount of food residue in gastric cavity       complete eveluation of gatric cavity was not feasible.      One anastomosis was noted, which had superficial, clean based ulcers in       the margin. As the scope was advanced beyond the anastomosis the small       bowel appeared unremarkable.      Due to presence of large phytobezoar, rest of the gastric cavity was not       completely visualized. Impression:               - Monilial esophagitis.                           -  Z-line regular, 38 cm from the incisors.                           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                           - A large amount of food (residue) in the stomach.                           - Normal examined jejunum.                           - No specimens collected. Moderate Sedation:      Patient did not receive moderate sedation for  this procedure, but       instead received monitored anesthesia care. Recommendation:           - Low fiber diet and low residue diet today.                           - Continue present medications.                           - Diflucan (fluconazole) 100 mg PO daily for 7 days.                           - Use Protonix (pantoprazole) 40 mg PO BID.                           - Repeat upper endoscopy in 2 months to assess                            disease activity.                           - Return patient to hospital ward for ongoing care. Procedure Code(s):        --- Professional ---                           [phone], Esophagogastroduodenoscopy, flexible,                            transoral; diagnostic, including collection of                            specimen(s) by brushing or washing, when performed                            (separate procedure) Diagnosis Code(s):        --- Professional ---                           B37.81, Candidal esophagitis  Z98.84, Bariatric surgery status                           D50.9, Iron deficiency anemia, unspecified CPT copyright 2016 [employer]. All rights reserved. The codes documented in this report are preliminary and upon coder review may  be revised to meet current compliance requirements. Ronnette Juniper, MD 07/19/2017 10:56:14 AM This report has been signed electronically. Number of Addenda: 0

## 2017-07-19 NOTE — Brief Op Note (Signed)
07/10/2017 - 07/19/2017  10:58 AM  PATIENT:  Sara Fox  77 y.o. female  PRE-OPERATIVE DIAGNOSIS:  anemia, black stools  POST-OPERATIVE DIAGNOSIS:  candida esophaghitis, marginal ulcer, food in stomach  PROCEDURE:  Procedure(s): ESOPHAGOGASTRODUODENOSCOPY (EGD) WITH PROPOFOL (Left)  SURGEON:  Surgeon(s) and Role:    Ronnette Juniper, MD - Primary  PHYSICIAN ASSISTANT:   ASSISTANTS: Darlin Drop, RN, William Dalton, Tech  ANESTHESIA:   MAC  EBL:  None  BLOOD ADMINISTERED:none  DRAINS: none   LOCAL MEDICATIONS USED:  NONE  SPECIMEN:  No Specimen  DISPOSITION OF SPECIMEN:  N/A  COUNTS:  YES  TOURNIQUET:  * No tourniquets in log *  DICTATION: .Dragon Dictation  PLAN OF CARE: Admit to inpatient   PATIENT DISPOSITION:  PACU - hemodynamically stable.   Delay start of Pharmacological VTE agent (>24hrs) due to surgical blood loss or risk of bleeding: no

## 2017-07-19 NOTE — Transfer of Care (Signed)
Immediate Anesthesia Transfer of Care Note  Patient: DALLY OSHEL  Procedure(s) Performed: ESOPHAGOGASTRODUODENOSCOPY (EGD) WITH PROPOFOL (Left )  Patient Location: PACU  Anesthesia Type:MAC  Level of Consciousness: awake, alert  and oriented  Airway & Oxygen Therapy: Patient Spontanous Breathing and Patient connected to nasal cannula oxygen  Post-op Assessment: Report given to RN and Post -op Vital signs reviewed and stable  Post vital signs: Reviewed and stable  Last Vitals:  Vitals:   07/19/17 0939 07/19/17 1026  BP: (!) 99/49 (!) 108/45  Pulse: 84 84  Resp: 17 18  Temp: 36.8 C 37.2 C  SpO2: 95%     Last Pain:  Vitals:   07/19/17 1026  TempSrc: Oral  PainSc:       Patients Stated Pain Goal: 2 (91/63/84 6659)  Complications: No apparent anesthesia complications

## 2017-07-19 NOTE — Progress Notes (Signed)
Physical Therapy Treatment Patient Details Name: Sara Fox MRN: [id number] DOB: February 25, 1941 Today's Date: 07/19/2017    History of Present Illness 77 yo female admitted with weakness, confusion, diarrhea, ARF. hx of CHF, HTN, CAD, falls, L wrist fx?    PT Comments    Improved mobility today, decreased assist needed for bed mobility, transfers, and ambulation. Pt ambulated 64' with RW, distance limited by fatigue, no buckling of LEs this session.   Follow Up Recommendations  SNF     Equipment Recommendations  None recommended by PT    Recommendations for Other Services       Precautions / Restrictions Precautions Precautions: Fall Precaution Comments: B knees buckle without notice; pt reports h/o 3 recent falls Restrictions Weight Bearing Restrictions: No    Mobility  Bed Mobility Overal bed mobility: Needs Assistance Bed Mobility: Supine to Sit     Supine to sit: Modified independent (Device/Increase time)     General bed mobility comments: used rail  Transfers Overall transfer level: Needs assistance Equipment used: Rolling walker (2 wheeled) Transfers: Sit to/from Stand Sit to Stand: Min assist         General transfer comment: min A to rise, no buckling of knees today  Ambulation/Gait Ambulation/Gait assistance: Min guard Ambulation Distance (Feet): 18 Feet Assistive device: Rolling walker (2 wheeled) Gait Pattern/deviations: Step-to pattern;Step-through pattern;Narrow base of support Gait velocity: decreased   General Gait Details: steady with RW, no buckling today   Stairs            Wheelchair Mobility    Modified Rankin (Stroke Patients Only)       Balance Overall balance assessment: Needs assistance Sitting-balance support: Bilateral upper extremity supported;Feet supported Sitting balance-Leahy Scale: Fair     Standing balance support: Bilateral upper extremity supported;During functional activity Standing balance-Leahy  Scale: Poor                              Cognition Arousal/Alertness: Awake/alert Behavior During Therapy: WFL for tasks assessed/performed Overall Cognitive Status: Within Functional Limits for tasks assessed                                 General Comments: AxO X 4 pleasant lady      Exercises      General Comments        Pertinent Vitals/Pain Pain Assessment: No/denies pain    Home Living                      Prior Function            PT Goals (current goals can now be found in the care plan section) Acute Rehab PT Goals Patient Stated Goal: none stated PT Goal Formulation: With patient Time For Goal Achievement: 07/26/17 Potential to Achieve Goals: Fair Progress towards PT goals: Progressing toward goals    Frequency    Min 3X/week      PT Plan Current plan remains appropriate    Co-evaluation              AM-PAC PT "6 Clicks" Daily Activity  Outcome Measure  Difficulty turning over in bed (including adjusting bedclothes, sheets and blankets)?: A Little Difficulty moving from lying on back to sitting on the side of the bed? : A Little Difficulty sitting down on and standing up from a chair with arms (  e.g., wheelchair, bedside commode, etc,.)?: A Little Help needed moving to and from a bed to chair (including a wheelchair)?: A Little Help needed walking in hospital room?: A Little Help needed climbing 3-5 steps with a railing? : Total 6 Click Score: 16    End of Session Equipment Utilized During Treatment: Gait belt Activity Tolerance: Patient limited by fatigue Patient left: in chair;with call bell/phone within reach;with chair alarm set Nurse Communication: Mobility status(chair alarm box needs batteries -RN and NT notified) PT Visit Diagnosis: Muscle weakness (generalized) (M62.81);Difficulty in walking, not elsewhere classified (R26.2);Other abnormalities of gait and mobility (R26.89)     Time:  5830-9407 PT Time Calculation (min) (ACUTE ONLY): 11 min  Charges:  $Gait Training: 8-22 mins                    G Codes:          Philomena Doheny 07/19/2017, 3:33 PM [phone]

## 2017-07-19 NOTE — Progress Notes (Addendum)
CRITICAL VALUE ALERT  Critical Value:  hgb 6.9  Date & Time Notied:  [id number]  Provider Notified: yes,   Orders Received/Actions taken: new orders written by attending

## 2017-07-19 NOTE — Anesthesia Postprocedure Evaluation (Signed)
Anesthesia Post Note  Patient: Sara Fox  Procedure(s) Performed: ESOPHAGOGASTRODUODENOSCOPY (EGD) WITH PROPOFOL (Left )     Patient location during evaluation: PACU Anesthesia Type: MAC Level of consciousness: awake and alert Pain management: pain level controlled Vital Signs Assessment: post-procedure vital signs reviewed and stable Respiratory status: spontaneous breathing, nonlabored ventilation, respiratory function stable and patient connected to nasal cannula oxygen Cardiovascular status: stable and blood pressure returned to baseline Postop Assessment: no apparent nausea or vomiting Anesthetic complications: no    Last Vitals:  Vitals:   07/19/17 1058 07/19/17 1253  BP: (!) 93/46 (!) 110/56  Pulse: 90 80  Resp: (!) 24 20  Temp:  36.9 C  SpO2: 91% 100%    Last Pain:  Vitals:   07/19/17 1253  TempSrc: Oral  PainSc:                  [name]

## 2017-07-19 NOTE — Progress Notes (Signed)
Patient ID: Sara Fox, female   DOB: 24-Jun-1940, 77 y.o.   MRN: [id number] [facility] Progress Note   Assessment/ Plan:   1. Acute kidney injury: Suspect that this is multifactorial/ ATN from rhabdomyolysis and ACEi effects resulting in ATN.  Creat improving w IVF"s now.   Pt would likely benefit from renal f/u in office, our office will call family to set this up.  Would avoid ACEi/ ARB for 2-3 mos and also avoid nsaids.  Will sign off.  2. Rhabdomyolysis: Suspected to be secondary to recent falls/bedridden state and ongoing statin use.  CPK down < 5000  3. Anemia: Seen by gastroenterology and their plans for expectant management noted. Iron stores replete.  4. Hypertension: Blood pressure currently acceptable.  Holding home lisinopril w. AKI.    Kelly Splinter MD [employer] pgr [phone]   07/19/2017, 3:46 PM    Subjective:   No new c/o's, in good spirits, in the chair, no family here today.    Objective:   BP 109/60 (BP Location: Left Arm)   Pulse 84   Temp 98.7 F (37.1 C) (Oral)   Resp 18   Ht 5\' 4"  (1.626 m)   Wt 70.8 kg (156 lb)   SpO2 95%   BMI 26.78 kg/m   Intake/Output Summary (Last 24 hours) at 07/19/2017 1546 Last data filed at 07/19/2017 1253 Gross per 24 hour  Intake 1580 ml  Output 1500 ml  Net 80 ml   Weight change:   Physical Exam: Gen: Comfortably resting in bed, dry mouth CVS: Pulse regular rhythm, normal rate, S1 and S2 normal Resp: Coarse/transmitted breath sounds bilaterally without distinct rales or rhonchi Abd: Soft, flat, nontender Ext: no LE edema  Imaging: No results found.  Labs: BMET Recent Labs  Lab 07/13/17 0549 07/14/17 0533 07/15/17 0553 07/16/17 0607 07/17/17 0607 07/18/17 0620 07/19/17 0605  NA 140 140 137 143 141 140 139  K 3.2* 3.1* 3.7 3.9 3.8 3.5 3.3*  CL 104 95* 92* 96* 97* 99* 101  CO2 23 32 33* 34* 31 28 26   GLUCOSE 123* 117* 102* 104* 102* 101* 98  BUN 62* 69* 69* 75* 76* 72*  69*  CREATININE 4.65* 4.62* 4.45* 4.40* 4.03* 3.76* 3.37*  CALCIUM 8.1* 8.5* 8.7* 9.0 8.7* 8.7* 8.5*  PHOS 4.9* 4.7* 4.9* 5.1* 5.2* 5.0*  --    CBC Recent Labs  Lab 07/15/17 0553 07/16/17 0607 07/17/17 0607 07/18/17 0620 07/19/17 0605  WBC 9.5 8.0 9.0 9.4 9.9  NEUTROABS 7.3 5.6 6.6 6.9  --   HGB 8.9* 8.4* 7.8* 7.5* 6.9*  HCT 27.6* 26.2* 24.6* 23.6* 21.3*  MCV 97.5 98.9 99.6 99.6 100.0  PLT 333 360 362 317 332    Medications:    . feeding supplement (ENSURE ENLIVE)  237 mL Oral BID BM  . fluconazole  100 mg Oral Daily  . gabapentin  100 mg Oral QHS  . metoprolol tartrate  50 mg Oral BID WC  . mometasone-formoterol  2 puff Inhalation BID  . oxybutynin  2.5 mg Oral BID  . pantoprazole  40 mg Oral BID WC  . vitamin C  500 mg Oral Daily

## 2017-07-19 NOTE — Progress Notes (Addendum)
Attending MD note  Patient was seen, examined,treatment plan was discussed with the PA-S.  I have personally reviewed the clinical findings, lab, imaging studies and management of this patient in detail. I agree with the documentation, as recorded by the PA-S  Patient is a 77 year old female with medical history significant of systolic congestive heart failure, hypertension, coronary artery disease who presented to the emergency department after being sent by her primary care physician due to lab work abnormalities which included but increasing creatinine.  Patient was admitted with a working diagnosis of acute renal injury due to rhabdomyolysis, possible GI bleed and acute encephalopathy.  Patient seen and examined, she is alert and oriented.  Status post EGD.  Denies chest pain, shortness of breath and palpitation.  No bloody stools or hematemesis.  On Exam: Blood pressure 109/60, pulse 84, temperature 98.7 F (37.1 C), temperature source Oral, resp. rate 18, height 5\' 4"  (1.626 m), weight 70.8 kg (156 lb), SpO2 95 %.  Gen. exam: Awake, alert, not in any distress Chest: Good air entry bilaterally, no rhonchi or rales CVS: S1-S2 regular, no murmurs Abdomen: Soft, nontender and nondistended Neurology: Non-focal Skin: Lumbosacral ulcer  Assessment/plan: Acute renal failure - multifactorial due to rhabdomyolysis and ACE inhibitor Creatinine on admission 3.87 Creatinine continues to improve with IV fluids. Continue gentle hydration, avoid nephrotoxic. Nephrology recommendations appreciated. -They have sign off and will follow-up as an outpatient Check BMP in a.m.  Rhabdomyolysis Due to mechanical fall Being treated with IV fluid, creatinine trending down Check CK levels in a.m.  Blood loss anemia/melena  Hemoglobin dropped today to 6.9, patient is a status post 1 unit of PRBCs - repeat H/H And transfuse if hemoglobin below 7 Patient underwent EGD with not clear evidence of  bleeding culprit. She does have small gastro jejunal ulcer with clean base GI recommended Protonix 40 mg twice a day and low fiber, low residue diet Replete iron  Hypertension BP currently stable, holding lisinopril  due to AKI  Esophageal candidiasis Incidental finding on EGD Likely due to steroid inhaler with poor hygiene after using the medication Treated with Diflucan  Pressure ulcer - lumbar sacral -stage II POA Wound care per nursing protocol  Rest as below  Chipper Oman, MD    Progress Note   Sara Fox ZOX:[id number] DOB: August 06, 1940 DOA: 07/10/2017 PCP: [name] Chard, MD   LOS: 8 days    Brief Narrative:   Sara Fox is a 77 year old female with a medical history significant for systolic congestive heart failure, hypertension, and coronary artery disease. She was seen by her primary care physician in the office and was sent to the ED after her blood work revealed abnormalities. She presented with weakness, confusion, diarrhea, lethergy, and right leg pain. Admitted for acute kidney injury, acute rhabdomyolysis, possible GI bleed, acute encephalopathy, and elevated LFTs.  Assessment/Plan:   Principal Problem:   AKI (acute kidney injury) ([place]) Active Problems:   Normocytic anemia   Rhabdomyolysis   Melena   Hypoalbuminemia   Elevated LFTs   Malnutrition of moderate degree   Pressure injury of skin  Acute kidney injury -Nephrology consult -- suspect multifactorial from rhabdomyolysis and ACEi effects resulting in ATN. -Cr on admission was 3.87 -Cr has now improved to 3.37 -Renal function improving, continue management per Nephrology  Rhabdomyolysis -CK at admission was 40,213. -CK trending down rapidly, was 2,000 on 07/18/2017.  Melena, low volume - prior to admission -Per description GI consult placed. -GI performed EDG with no significant  findings of bleeding source.  Candidiasis within esophagus -Found via EDG performed on 07/19/2017.  -Likely  caused by improper use of steroid inhaler. -Plan to treat with fluconazole.   Normocytic anemia -Hgb still trending down, was 9.1 at admission and is now 6.9, however no frank bleeding is present and nothing identified via EDG. Potentially dilution.  Moderate malnutrition -Per dietitian   Elevated LFTs -At admission, AST 1239 and ALT 843. -Values trending down, AST 152 and ALT 345.  Lumbosacral ulcer -Continue current wound management.  Grade 1 diastolic dysfunction  Family Communication/Anticipated D/C date and plan/Code Status   DVT prophylaxis: SCDs Code Status: Full code Family Communication: None Disposition Plan: Home once renal function is improved and stable   Medical Consultants:    Nephrology  Gastroenterology   Procedures:    Echo  Study Conclusions  - Left ventricle: The cavity size was normal. There was mild concentric hypertrophy. Systolic function was normal. The estimated ejection fraction was in the range of 60% to 65%. Wall motion was normal; there were no regional wall motion abnormalities. Doppler parameters are consistent with abnormal left ventricular relaxation (grade 1 diastolic dysfunction). - Mitral valve: Calcified annulus. - Atrial septum: No defect or patent foramen ovale was identified.   EGD Findings:  -Patchy candidiasis was found in the middle third of the esophagus and in the lower third of the esophagus. -The Z-line was regular and was found 38 cm from the incisors. 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 This was characterized by erythema, inflammation and ulceration. -A large amount of food (residue) was found in the gastric body. -The cardia and gastric fundus were normal on retroflexion. -The examined jejunum was normal. -Previous EGD  advanced beyond the anastomosis the small bowel appeared unremarkable. Due to presence of large phytobezoar, rest of the gastric cavity was not  completely visualized.  Impression:  - Monilial esophagitis. - Z-line regular, 38 cm from the incisors. 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 - A large amount of food (residue) in the stomach. - Normal examined jejunum. - No specimens collected.  Antimicrobials:    None.   Subjective:   Patient feels that she is doing much better today. Notes that she has not had any new symptoms. Denies chest pain, SOB, abdominal pain, HA, fatigue, myalgia, paresthesias, and numbness.    Objective:   Vitals:   07/19/17 1026 07/19/17 1042 07/19/17 1058 07/19/17 1253  BP: (!) 108/45 99/70 (!) 93/46 (!) 110/56  Pulse: 84 77 90 80  Resp: 18 (!) 24 (!) 24 20  Temp: 98.9 F (37.2 C) 98.8 F (37.1 C)  98.5 F (36.9 C)  TempSrc: Oral Skin  Oral  SpO2:  100% 91% 100%  Weight:      Height:        Intake/Output Summary (Last 24 hours) at 07/19/2017 1402 Last data filed at 07/19/2017 1253 Gross per 24 hour  Intake 2579.58 ml  Output 1500 ml  Net 1079.58 ml   Filed Weights   07/17/17 0408 07/18/17 0416 07/19/17 0939  Weight: 72.6 kg (160 lb) 70.8 kg (156 lb) 70.8 kg (156 lb)     Physical Exam:   Constitutional: NAD Skin: Lumbosacral ulcer. No rashes. Cardiovascular: RRR, no m/r/g. No LE edema.  Respiratory: CTAB, no wheezing, no crackles, no rhonchi. Normal WOB. No accessory muscle use.  Abdomen: No TTP. Bowel sounds positive.  Musculoskeletal: No clubbing / cyanosis.  Psychiatric: Normal affect.  Data Reviewed:   I have independently reviewed following labs and imaging studies:   CBC: Recent Labs  Lab 2017-08-06 0533 07/15/17 0553 07/16/17 0607 07/17/17 0607 07/18/17 0620 07/19/17 0605  WBC 10.3 9.5 8.0 9.0 9.4 9.9  NEUTROABS 8.4* 7.3 5.6 6.6 6.9  --   HGB 9.0* 8.9* 8.4* 7.8* 7.5* 6.9*  HCT 26.7* 27.6* 26.2* 24.6* 23.6* 21.3*  MCV 93.0 97.5 98.9 99.6 99.6 100.0  PLT 308 333 360 362 317 509   Basic Metabolic  Panel: Recent Labs  Lab 08-06-17 0533 07/15/17 0553 07/16/17 0607 07/17/17 0607 07/18/17 0620 07/19/17 0605  NA 140 137 143 141 140 139  K 3.1* 3.7 3.9 3.8 3.5 3.3*  CL 95* 92* 96* 97* 99* 101  CO2 32 33* 34* 31 28 26   GLUCOSE 117* 102* 104* 102* 101* 98  BUN 69* 69* 75* 76* 72* 69*  CREATININE 4.62* 4.45* 4.40* 4.03* 3.76* 3.37*  CALCIUM 8.5* 8.7* 9.0 8.7* 8.7* 8.5*  MG 2.1 2.1 2.1 2.1 2.0  --   PHOS 4.7* 4.9* 5.1* 5.2* 5.0*  --    GFR: Estimated Creatinine Clearance: 13.5 mL/min (A) (by C-G formula based on SCr of 3.37 mg/dL (H)). Liver Function Tests: Recent Labs  Lab 06-Aug-2017 0533 07/15/17 0553 07/16/17 0607 07/17/17 0607 07/18/17 0620  AST 525* 435* 290* 213* 152*  ALT 597* 568* 464* 394* 348*  ALKPHOS 91 87 79 77 75  BILITOT 0.5 0.5 0.7 0.7 0.7  PROT 4.7* 5.0* 5.1* 5.0* 5.1*  ALBUMIN 2.0* 2.0* 2.1* 2.2* 2.3*   No results for input(s): LIPASE, AMYLASE in the last 168 hours. No results for input(s): AMMONIA in the last 168 hours. Coagulation Profile: No results for input(s): INR, PROTIME in the last 168 hours. Cardiac Enzymes: Recent Labs  Lab 08-06-2017 0533 07/15/17 0553 07/16/17 0607 07/17/17 0607 07/18/17 0620  CKTOTAL 12,940* 8,943* 4,815* 3,229* 2,050*   BNP (last 3 results) No results for input(s): PROBNP in the last 8760 hours. HbA1C: No results for input(s): HGBA1C in the last 72 hours. CBG: No results for input(s): GLUCAP in the last 168 hours. Lipid Profile: No results for input(s): CHOL, HDL, LDLCALC, TRIG, CHOLHDL, LDLDIRECT in the last 72 hours. Thyroid Function Tests: No results for input(s): TSH, T4TOTAL, FREET4, T3FREE, THYROIDAB in the last 72 hours. Anemia Panel: No results for input(s): VITAMINB12, FOLATE, FERRITIN, TIBC, IRON, RETICCTPCT in the last 72 hours. Urine analysis:    Component Value Date/Time   COLORURINE AMBER (A) 07/10/2017 2302   APPEARANCEUR HAZY (A) 07/10/2017 2302   LABSPEC 1.015 07/10/2017 2302   PHURINE 5.0  07/10/2017 2302   GLUCOSEU NEGATIVE 07/10/2017 2302   HGBUR LARGE (A) 07/10/2017 2302   BILIRUBINUR NEGATIVE 07/10/2017 2302   KETONESUR NEGATIVE 07/10/2017 2302   PROTEINUR 100 (A) 07/10/2017 2302   UROBILINOGEN 0.2 09/12/2014 2302   NITRITE NEGATIVE 07/10/2017 2302   LEUKOCYTESUR NEGATIVE 07/10/2017 2302   Sepsis Labs: Invalid input(s): PROCALCITONIN, LACTICIDVEN  No results found for this or any previous visit (from the past 240 hour(s)).    Radiology Studies:  ABDOMEN ULTRASOUND COMPLETE COMPARISON:  Ultrasound 08/25/2010  FINDINGS: Gallbladder: Prior cholecystectomy  -Common bile duct: Diameter: Dilated, 7-8 mm, likely related to patient's age and post cholecystectomy state. -Liver: Heterogeneous echotexture. No focal abnormality. Portal vein is patent on color Doppler imaging with normal direction of blood flow towards the liver. -IVC: No abnormality visualized. -Pancreas: Visualized portion unremarkable. -Spleen: Size and appearance within normal limits. -Right Kidney: Length: 10.9 cm.  Mildly increased echotexture. No mass or hydronephrosis. -Left Kidney: Length: 10.7 cm. Mildly increased echotexture. No mass or hydronephrosis. -Abdominal aorta: No aneurysm visualized. -Other findings: None.  IMPRESSION: -No acute findings. -Mildly increased echotexture in the kidneys bilaterally suggesting early chronic medical renal disease.   Electronically Signed   By: Rolm Baptise M.D.   On: 07/11/2017 01:53   Medication:   . feeding supplement (ENSURE ENLIVE)  237 mL Oral BID BM  . fluconazole  100 mg Oral Daily  . gabapentin  100 mg Oral QHS  . metoprolol tartrate  50 mg Oral BID WC  . mometasone-formoterol  2 puff Inhalation BID  . oxybutynin  2.5 mg Oral BID  . pantoprazole  40 mg Oral BID WC  . vitamin C  500 mg Oral Daily    Continuous Infusions: . sodium chloride 75 mL/hr at 07/19/17 1209     Signed, Ave Filter, PA-S [place] PA  Class of [id number]

## 2017-07-19 NOTE — Anesthesia Preprocedure Evaluation (Addendum)
Anesthesia Evaluation  Patient identified by MRN, date of birth, ID band Patient awake    Reviewed: Allergy & Precautions, NPO status , Patient's Chart, lab work & pertinent test results  Airway Mallampati: II  TM Distance: >3 FB Neck ROM: Full    Dental   Pulmonary sleep apnea , COPD, Current Smoker,    breath sounds clear to auscultation       Cardiovascular hypertension, Pt. on medications and Pt. on home beta blockers + CAD and + Past MI   Rhythm:Regular Rate:Normal     Neuro/Psych negative neurological ROS     GI/Hepatic Neg liver ROS, melena   Endo/Other  negative endocrine ROS  Renal/GU Renal disease     Musculoskeletal   Abdominal   Peds  Hematology  (+) anemia ,   Anesthesia Other Findings   Reproductive/Obstetrics                             Lab Results  Component Value Date   WBC 9.9 07/19/2017   HGB 6.9 (LL) 07/19/2017   HCT 21.3 (L) 07/19/2017   MCV 100.0 07/19/2017   PLT 332 07/19/2017   Lab Results  Component Value Date   CREATININE 3.37 (H) 07/19/2017   BUN 69 (H) 07/19/2017   NA 139 07/19/2017   K 3.3 (L) 07/19/2017   CL 101 07/19/2017   CO2 26 07/19/2017    Anesthesia Physical Anesthesia Plan  ASA: IV  Anesthesia Plan: MAC   Post-op Pain Management:    Induction: Intravenous  PONV Risk Score and Plan: 1 and Ondansetron, Propofol infusion and Treatment may vary due to age or medical condition  Airway Management Planned: Natural Airway and Nasal Cannula  Additional Equipment:   Intra-op Plan:   Post-operative Plan:   Informed Consent: I have reviewed the patients History and Physical, chart, labs and discussed the procedure including the risks, benefits and alternatives for the proposed anesthesia with the patient or authorized representative who has indicated his/her understanding and acceptance.     Plan Discussed with:   Anesthesia Plan  Comments:         Anesthesia Quick Evaluation

## 2017-07-20 DIAGNOSIS — D631 Anemia in chronic kidney disease: Secondary | ICD-10-CM | POA: Diagnosis not present

## 2017-07-20 DIAGNOSIS — L89150 Pressure ulcer of sacral region, unstageable: Secondary | ICD-10-CM | POA: Diagnosis not present

## 2017-07-20 DIAGNOSIS — I5022 Chronic systolic (congestive) heart failure: Secondary | ICD-10-CM | POA: Diagnosis not present

## 2017-07-20 DIAGNOSIS — N184 Chronic kidney disease, stage 4 (severe): Secondary | ICD-10-CM | POA: Diagnosis not present

## 2017-07-20 DIAGNOSIS — M6282 Rhabdomyolysis: Secondary | ICD-10-CM | POA: Diagnosis not present

## 2017-07-20 DIAGNOSIS — R945 Abnormal results of liver function studies: Secondary | ICD-10-CM | POA: Diagnosis not present

## 2017-07-20 DIAGNOSIS — K283 Acute gastrojejunal ulcer without hemorrhage or perforation: Secondary | ICD-10-CM | POA: Diagnosis not present

## 2017-07-20 DIAGNOSIS — D649 Anemia, unspecified: Secondary | ICD-10-CM | POA: Diagnosis not present

## 2017-07-20 DIAGNOSIS — I129 Hypertensive chronic kidney disease with stage 1 through stage 4 chronic kidney disease, or unspecified chronic kidney disease: Secondary | ICD-10-CM | POA: Diagnosis not present

## 2017-07-20 DIAGNOSIS — R278 Other lack of coordination: Secondary | ICD-10-CM | POA: Diagnosis not present

## 2017-07-20 DIAGNOSIS — I509 Heart failure, unspecified: Secondary | ICD-10-CM | POA: Diagnosis not present

## 2017-07-20 DIAGNOSIS — J449 Chronic obstructive pulmonary disease, unspecified: Secondary | ICD-10-CM | POA: Diagnosis not present

## 2017-07-20 DIAGNOSIS — N289 Disorder of kidney and ureter, unspecified: Secondary | ICD-10-CM | POA: Diagnosis not present

## 2017-07-20 DIAGNOSIS — K921 Melena: Secondary | ICD-10-CM | POA: Diagnosis not present

## 2017-07-20 DIAGNOSIS — N179 Acute kidney failure, unspecified: Secondary | ICD-10-CM | POA: Diagnosis not present

## 2017-07-20 DIAGNOSIS — G894 Chronic pain syndrome: Secondary | ICD-10-CM | POA: Diagnosis not present

## 2017-07-20 DIAGNOSIS — N2581 Secondary hyperparathyroidism of renal origin: Secondary | ICD-10-CM | POA: Diagnosis not present

## 2017-07-20 DIAGNOSIS — I10 Essential (primary) hypertension: Secondary | ICD-10-CM | POA: Diagnosis not present

## 2017-07-20 DIAGNOSIS — K219 Gastro-esophageal reflux disease without esophagitis: Secondary | ICD-10-CM | POA: Diagnosis not present

## 2017-07-20 DIAGNOSIS — E785 Hyperlipidemia, unspecified: Secondary | ICD-10-CM | POA: Diagnosis not present

## 2017-07-20 DIAGNOSIS — N390 Urinary tract infection, site not specified: Secondary | ICD-10-CM | POA: Diagnosis not present

## 2017-07-20 DIAGNOSIS — E440 Moderate protein-calorie malnutrition: Secondary | ICD-10-CM | POA: Diagnosis not present

## 2017-07-20 DIAGNOSIS — M6281 Muscle weakness (generalized): Secondary | ICD-10-CM | POA: Diagnosis not present

## 2017-07-20 DIAGNOSIS — E860 Dehydration: Secondary | ICD-10-CM | POA: Diagnosis not present

## 2017-07-20 DIAGNOSIS — I2510 Atherosclerotic heart disease of native coronary artery without angina pectoris: Secondary | ICD-10-CM | POA: Diagnosis not present

## 2017-07-20 LAB — CBC WITH DIFFERENTIAL/PLATELET
Basophils Absolute: 0 10*3/uL (ref 0.0–0.1)
Basophils Relative: 0 %
Eosinophils Absolute: 0.1 10*3/uL (ref 0.0–0.7)
Eosinophils Relative: 1 %
HCT: 25.1 % — ABNORMAL LOW (ref 36.0–46.0)
Hemoglobin: 7.9 g/dL — ABNORMAL LOW (ref 12.0–15.0)
Lymphocytes Relative: 15 %
Lymphs Abs: 1.1 10*3/uL (ref 0.7–4.0)
MCH: 31.2 pg (ref 26.0–34.0)
MCHC: 31.5 g/dL (ref 30.0–36.0)
MCV: 99.2 fL (ref 78.0–100.0)
Monocytes Absolute: 1 10*3/uL (ref 0.1–1.0)
Monocytes Relative: 13 %
Neutro Abs: 5.6 10*3/uL (ref 1.7–7.7)
Neutrophils Relative %: 71 %
Platelets: 359 10*3/uL (ref 150–400)
RBC: 2.53 MIL/uL — ABNORMAL LOW (ref 3.87–5.11)
RDW: 16.8 % — ABNORMAL HIGH (ref 11.5–15.5)
WBC: 7.8 10*3/uL (ref 4.0–10.5)

## 2017-07-20 LAB — BASIC METABOLIC PANEL
Anion gap: 9 (ref 5–15)
BUN: 65 mg/dL — ABNORMAL HIGH (ref 6–20)
CO2: 26 mmol/L (ref 22–32)
Calcium: 8.6 mg/dL — ABNORMAL LOW (ref 8.9–10.3)
Chloride: 105 mmol/L (ref 101–111)
Creatinine, Ser: 3 mg/dL — ABNORMAL HIGH (ref 0.44–1.00)
GFR calc Af Amer: 16 mL/min — ABNORMAL LOW (ref >60)
GFR calc non Af Amer: 14 mL/min — ABNORMAL LOW (ref >60)
Glucose, Bld: 93 mg/dL (ref 65–99)
Potassium: 3.5 mmol/L (ref 3.5–5.1)
Sodium: 140 mmol/L (ref 135–145)

## 2017-07-20 LAB — TYPE AND SCREEN
ABO/RH(D): O POS
Antibody Screen: NEGATIVE
Unit division: 0

## 2017-07-20 LAB — BPAM RBC
Blood Product Expiration Date: 201904022359
ISSUE DATE / TIME: 201903071016
Unit Type and Rh: 5100

## 2017-07-20 LAB — CK: Total CK: 885 U/L — ABNORMAL HIGH (ref 38–234)

## 2017-07-20 LAB — MAGNESIUM: Magnesium: 1.8 mg/dL (ref 1.7–2.4)

## 2017-07-20 MED ORDER — PANTOPRAZOLE SODIUM 40 MG PO TBEC: 40.0000 mg | DELAYED_RELEASE_TABLET | Freq: Two times a day (BID) | ORAL | Status: DC

## 2017-07-20 MED ORDER — FLUCONAZOLE 50 MG PO TABS: 50.0000 mg | ORAL_TABLET | Freq: Every day | ORAL | Status: DC

## 2017-07-20 MED ORDER — NIFEREX PO TABS: 150.0000 mg | ORAL_TABLET | Freq: Every day | ORAL | Status: DC

## 2017-07-20 NOTE — Progress Notes (Signed)
Patient has discharged to SNF for rehab [facility] . RN called to give a report at 1350 to  Rn Nia. No question at this time. Family member is updated and notified. SW is notified. [facility] will transport patient to SNF around 1600.

## 2017-07-20 NOTE — Progress Notes (Signed)
Pt's IVs removed and her belongings were packed up to be transported by [facility] to [facility].  Pt stable and agreeable to d/c.

## 2017-07-20 NOTE — Clinical Social Work Placement (Signed)
    1:34 PM Patient and family chose bed at [facility].   LCSW confirmed bed with facility. Room 3251.  LCSW notified family of transfer.   Patient will transport by [facility]. Patient can transport at [place] faxed dc docs to facility.  RN report number: [id number]  CLINICAL SOCIAL WORK PLACEMENT  NOTE  Date:  07/20/2017  Patient Details  Name: Sara Fox MRN: [id number] Date of Birth: 1940/05/21  Clinical Social Work is seeking post-discharge placement for this patient at the [place] level of care (*CSW will initial, date and re-position this form in  chart as items are completed):  Yes   Patient/family provided with [facility] Work Department's list of facilities offering this level of care within the geographic area requested by the patient (or if unable, by the patient's family).  Yes   Patient/family informed of their freedom to choose among providers that offer the needed level of care, that participate in Medicare, Medicaid or managed care program needed by the patient, have an available bed and are willing to accept the patient.  Yes   Patient/family informed of [facility]'s ownership interest in [facility] and [facility], as well as of the fact that they are under no obligation to receive care at these facilities.  PASRR submitted to EDS on       PASRR number received on 07/13/17     Existing PASRR number confirmed on       FL2 transmitted to all facilities in geographic area requested by pt/family on 07/13/17     FL2 transmitted to all facilities within larger geographic area on       Patient informed that his/her managed care company has contracts with or will negotiate with certain facilities, including the following:        Yes   Patient/family informed of bed offers received.  Patient chooses bed at [facility]     Physician recommends and patient chooses bed at [facility]    Patient to be transferred to [facility] on 07/20/17.  Patient to be transferred to facility by EMS     Patient family notified on 07/20/17 of transfer.  Name of family member notified:  Jasmine and [place]       Additional Comment:    _______________________________________________ Servando Snare, LCSW 07/20/2017, 1:34 PM

## 2017-07-20 NOTE — Discharge Summary (Signed)
Physician Discharge Summary  Sara Fox  GMW:[id number]  DOB: 10-09-1940  DOA: 07/10/2017 PCP: [name] Chard, MD  Admit date: 07/10/2017 Discharge date: 07/20/2017  Admitted From: Home  Disposition:  SNF   Recommendations for Outpatient Follow-up:  1. Follow up with SNF provider at her earliest convenience 2. Please obtain BMP/CBC in one week to monitor hemoglobin and creatinine. 3. Patient will need nephrology follow-up as an outpatient  4. GI follow-up with Dr Therisa Doyne.  Patient will need repeat endoscopy in 2 months 5. Complete Diflucan 50 mg daily for 7 days 6. Low fiber, low residue diet  Discharge Condition: Stable CODE STATUS: Full code Diet recommendation: Heart Healthy / Low fiber, low residue  Brief/Interim Summary: For full details see H&P/Progress note, but in brief, Sara Fox is a 77 year old female with medical history significant of systolic congestive heart failure, hypertension, coronary artery disease who presented to the emergency department after being sent by her primary care physician due to lab work abnormalities which included but increasing creatinine.  Patient was admitted with a working diagnosis of acute renal injury due to rhabdomyolysis, possible GI bleed and acute encephalopathy.  Subjective: Patient seen and examined, she feels much better.  Tolerating diet very well.  Remains afebrile.  Denies chest pain, shortness of breath, bloody bowel movements or nausea and vomiting.  No acute events overnight.  Discharge Diagnoses/Hospital Course:  Principal Problem:   AKI (acute kidney injury) ([place]) Active Problems:   Normocytic anemia   Rhabdomyolysis   Melena   Hypoalbuminemia   Elevated LFTs   Malnutrition of moderate degree   Pressure injury of skin  Acute renal failure - multifactorial due to rhabdomyolysis and ACE inhibitor, causing ATN Nephrology was consulted and recommended IV hydration and avoid ACEi/ARB for 2-3 months also avoid  nephrotoxic agents (NSAIDs). [place] ASA and Lasix for now.  Creatinine on admission 3.87 - improved to 3.0 upon discharge, I suspect a component of chronic kidney disease unclear stage at this point as we do not have a previous baseline GFR. Encourage oral hydration She will need nephrology outpatient follow-up.  [facility] arranging appointment Check BMP in 1 week  Rhabdomyolysis - improved Due to mechanical fall LFTs trending down  She was treated with IV fluids, CK down to < 1000.   Blood loss anemia/melena - improved During hospital stay hemoglobin was trending down, with positive guaiac stool.  GI was consulted and patient underwent EGD with no clear evidence of bleeding culprit.  He was found to have a gastrojejunal ulcer with clean base.  She was transfused 1 unit of PRBCs and hemoglobin has remained stable after transfusion.  Will start iron replacement, as probably CKD is contributing to anemia as well.  Check hemoglobin in 1 week  Gastrojejunal ulcer  Not bleeding  GI recommending PPI 40 mg BID  Follow up in 2 months for repeat EGD   Hypertension Blood pressure is currently stable, lisinopril has been on hold due to renal failure. Will continue to hold lisinopril for now.  Continue metoprolol. Follow-up with PCP  Esophageal candidiasis Incidental finding on EGD Likely due to steroid inhaler with poor hygiene after using the medication Rinse oral cavity carefully after use on inhaler  Diflucan 50 mg daily for 7 days this is a renally adjusted dose.  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  Patient was seen by physical therapy, recommending SNF On the  day of the discharge the patient's vitals were stable, and no other acute medical condition were reported by patient. the patient was felt safe to be discharge to SNF  Discharge Instructions  You were  cared for by a hospitalist during your hospital stay. If you have any questions about your discharge medications or the care you received while you were in the hospital after you are discharged, you can call the unit and asked to speak with the hospitalist on call if the hospitalist that took care of you is not available. Once you are discharged, your primary care physician will handle any further medical issues. Please note that NO REFILLS for any discharge medications will be authorized once you are discharged, as it is imperative that you return to your primary care physician (or establish a relationship with a primary care physician if you do not have one) for your aftercare needs so that they can reassess your need for medications and monitor your lab values.  Discharge Instructions    Call MD for:  difficulty breathing, headache or visual disturbances   Complete by:  As directed    Call MD for:  extreme fatigue   Complete by:  As directed    Call MD for:  hives   Complete by:  As directed    Call MD for:  persistant dizziness or light-headedness   Complete by:  As directed    Call MD for:  persistant nausea and vomiting   Complete by:  As directed    Call MD for:  redness, tenderness, or signs of infection (pain, swelling, redness, odor or green/yellow discharge around incision site)   Complete by:  As directed    Call MD for:  severe uncontrolled pain   Complete by:  As directed    Call MD for:  temperature >100.4   Complete by:  As directed    Diet - low sodium heart healthy   Complete by:  As directed    Increase activity slowly   Complete by:  As directed      Allergies as of 07/20/2017      Reactions   Buprenorphine Hcl Shortness Of Breath   Throat swelling/trouble breathing and lethargic   Celebrex [celecoxib] Diarrhea, Swelling   Swelling of legs    Morphine And Related Shortness Of Breath   Throat swelling/trouble breathing and lethargic   Codeine Other (See Comments)    Bloated    Vioxx [rofecoxib] Palpitations      Medication List    STOP taking these medications   aspirin 81 MG tablet   furosemide 20 MG tablet Commonly known as:  LASIX   lisinopril 10 MG tablet Commonly known as:  PRINIVIL,ZESTRIL     TAKE these medications   budesonide-formoterol 160-4.5 MCG/ACT inhaler Commonly known as:  SYMBICORT Inhale 2 puffs into the lungs 2 (two) times daily.   CALCIUM CITRATE PO Take 1 tablet by mouth daily.   fluconazole 50 MG tablet Commonly known as:  DIFLUCAN Take 1 tablet (50 mg total) by mouth daily. Start taking on:  07/21/2017   gabapentin 100 MG capsule Commonly known as:  NEURONTIN Take 1 capsule (100 mg total) by mouth at bedtime.   metoprolol tartrate 50 MG tablet Commonly known as:  LOPRESSOR Take 1 tablet (50 mg total) by mouth 2 (two) times daily with a meal.   multivitamin capsule Take 1 capsule by mouth daily. Contains iron   NIFEREX Tabs Take 150 mg by mouth daily.  oxybutynin 5 MG tablet Commonly known as:  DITROPAN Take 2.5 mg by mouth 2 (two) times daily.   pantoprazole 40 MG tablet Commonly known as:  PROTONIX Take 1 tablet (40 mg total) by mouth 2 (two) times daily with a meal.   rosuvastatin 40 MG tablet Commonly known as:  CRESTOR Take 1 tablet (40 mg total) by mouth daily at 6 PM.   ticagrelor 90 MG Tabs tablet Commonly known as:  BRILINTA Take 90 mg by mouth 2 (two) times daily.   traMADol 50 MG tablet Commonly known as:  ULTRAM Take 1-2 tablets (50-100 mg total) by mouth every 6 (six) hours as needed.   vitamin C 500 MG tablet Commonly known as:  ASCORBIC ACID Take 500 mg by mouth daily.   Vitamin D3 2000 units capsule Take 2,000 Units by mouth daily.       Allergies  Allergen Reactions  . Buprenorphine Hcl Shortness Of Breath    Throat swelling/trouble breathing and lethargic  . Celebrex [Celecoxib] Diarrhea and Swelling    Swelling of legs   . Morphine And Related Shortness Of  Breath    Throat swelling/trouble breathing and lethargic  . Codeine Other (See Comments)    Bloated   . Vioxx [Rofecoxib] Palpitations    Consultations: GI  Nephrology   Procedures/Studies: US Abdomen Complete  Result Date: 07/11/2017 CLINICAL DATA:  Elevated LFTs EXAM: ABDOMEN ULTRASOUND COMPLETE COMPARISON:  Ultrasound 08/25/2010 FINDINGS: Gallbladder: Prior cholecystectomy Common bile duct: Diameter: Dilated, 7-8 mm, likely related to patient's age and post cholecystectomy state. Liver: Heterogeneous echotexture. No focal abnormality. Portal vein is patent on color Doppler imaging with normal direction of blood flow towards the liver. IVC: No abnormality visualized. Pancreas: Visualized portion unremarkable. Spleen: Size and appearance within normal limits. Right Kidney: Length: 10.9 cm. Mildly increased echotexture. No mass or hydronephrosis. Left Kidney: Length: 10.7 cm. Mildly increased echotexture. No mass or hydronephrosis. Abdominal aorta: No aneurysm visualized. Other findings: None. IMPRESSION: No acute findings. Mildly increased echotexture in the kidneys bilaterally suggesting early chronic medical renal disease. Electronically Signed   By: Rolm Baptise M.D.   On: 07/11/2017 01:53    Discharge Exam: Vitals:   07/19/17 2234 07/20/17 0458  BP: 95/69 (!) 104/51  Pulse: 81 74  Resp: 16 16  Temp: 98.7 F (37.1 C) 98.9 F (37.2 C)  SpO2: 93% 92%   Vitals:   07/19/17 1504 07/19/17 1954 07/19/17 2234 07/20/17 0458  BP: 109/60  95/69 (!) 104/51  Pulse: 84  81 74  Resp: 18  16 16   Temp: 98.7 F (37.1 C)  98.7 F (37.1 C) 98.9 F (37.2 C)  TempSrc: Oral  Oral Oral  SpO2: 95% 94% 93% 92%  Weight:    75.8 kg (167 lb)  Height:        General: Pt is alert, awake, not in acute distress Cardiovascular: RRR, S1/S2 +, no rubs, no gallops Respiratory: CTA bilaterally, no wheezing, no rhonchi Extremities: no edema, no cyanosis  The results of significant diagnostics from this  hospitalization (including imaging, microbiology, ancillary and laboratory) are listed below for reference.     Microbiology: No results found for this or any previous visit (from the past 240 hour(s)).   Labs: BNP (last 3 results) No results for input(s): BNP in the last 8760 hours. Basic Metabolic Panel: Recent Labs  Lab 07/14/17 0533 07/15/17 0553 07/16/17 5573 07/17/17 0607 07/18/17 0620 07/19/17 0605 07/20/17 0616  NA 140 137 143 141 140 139 140  K 3.1* 3.7 3.9 3.8 3.5 3.3* 3.5  CL 95* 92* 96* 97* 99* 101 105  CO2 32 33* 34* 31 28 26 26   GLUCOSE 117* 102* 104* 102* 101* 98 93  BUN 69* 69* 75* 76* 72* 69* 65*  CREATININE 4.62* 4.45* 4.40* 4.03* 3.76* 3.37* 3.00*  CALCIUM 8.5* 8.7* 9.0 8.7* 8.7* 8.5* 8.6*  MG 2.1 2.1 2.1 2.1 2.0  --  1.8  PHOS 4.7* 4.9* 5.1* 5.2* 5.0*  --   --    Liver Function Tests: Recent Labs  Lab 07/14/17 0533 07/15/17 0553 07/16/17 0607 07/17/17 0607 07/18/17 0620  AST 525* 435* 290* 213* 152*  ALT 597* 568* 464* 394* 348*  ALKPHOS 91 87 79 77 75  BILITOT 0.5 0.5 0.7 0.7 0.7  PROT 4.7* 5.0* 5.1* 5.0* 5.1*  ALBUMIN 2.0* 2.0* 2.1* 2.2* 2.3*   No results for input(s): LIPASE, AMYLASE in the last 168 hours. No results for input(s): AMMONIA in the last 168 hours. CBC: Recent Labs  Lab 07/15/17 0553 07/16/17 0607 07/17/17 0607 07/18/17 0620 07/19/17 0605 07/19/17 1839 07/20/17 0616  WBC 9.5 8.0 9.0 9.4 9.9  --  7.8  NEUTROABS 7.3 5.6 6.6 6.9  --   --  5.6  HGB 8.9* 8.4* 7.8* 7.5* 6.9* 8.0* 7.9*  HCT 27.6* 26.2* 24.6* 23.6* 21.3* 25.3* 25.1*  MCV 97.5 98.9 99.6 99.6 100.0  --  99.2  PLT 333 360 362 317 332  --  359   Cardiac Enzymes: Recent Labs  Lab 07/15/17 0553 07/16/17 0607 07/17/17 0607 07/18/17 0620 07/20/17 0616  CKTOTAL 8,943* 4,815* 3,229* 2,050* 885*   BNP: Invalid input(s): POCBNP CBG: No results for input(s): GLUCAP in the last 168 hours. D-Dimer No results for input(s): DDIMER in the last 72 hours. Hgb  A1c No results for input(s): HGBA1C in the last 72 hours. Lipid Profile No results for input(s): CHOL, HDL, LDLCALC, TRIG, CHOLHDL, LDLDIRECT in the last 72 hours. Thyroid function studies No results for input(s): TSH, T4TOTAL, T3FREE, THYROIDAB in the last 72 hours.  Invalid input(s): FREET3 Anemia work up No results for input(s): VITAMINB12, FOLATE, FERRITIN, TIBC, IRON, RETICCTPCT in the last 72 hours. Urinalysis    Component Value Date/Time   COLORURINE AMBER (A) 07/10/2017 2302   APPEARANCEUR HAZY (A) 07/10/2017 2302   LABSPEC 1.015 07/10/2017 2302   PHURINE 5.0 07/10/2017 2302   GLUCOSEU NEGATIVE 07/10/2017 2302   HGBUR LARGE (A) 07/10/2017 2302   BILIRUBINUR NEGATIVE 07/10/2017 2302   KETONESUR NEGATIVE 07/10/2017 2302   PROTEINUR 100 (A) 07/10/2017 2302   UROBILINOGEN 0.2 09/12/2014 2302   NITRITE NEGATIVE 07/10/2017 2302   LEUKOCYTESUR NEGATIVE 07/10/2017 2302   Sepsis Labs Invalid input(s): PROCALCITONIN,  WBC,  LACTICIDVEN Microbiology No results found for this or any previous visit (from the past 240 hour(s)).   Time coordinating discharge: 35 minutes  SIGNED:  Chipper Oman, MD  [facility] 07/20/2017, 1:27 PM  Pager please text page via  [email or web address]  Note - This record has been created using [employer]. Chart creation errors have been sought, but may not always have been located. Such creation errors do not reflect on the standard of medical care.

## 2017-07-20 NOTE — Progress Notes (Signed)
LCSW following for SNF placement.  Patient has bed at [facility] today. Patient can transport around 4 pm.   LCSW will continue to follow.  Sara Fox [place]

## 2017-07-23 DIAGNOSIS — I2510 Atherosclerotic heart disease of native coronary artery without angina pectoris: Secondary | ICD-10-CM | POA: Diagnosis not present

## 2017-07-23 DIAGNOSIS — K283 Acute gastrojejunal ulcer without hemorrhage or perforation: Secondary | ICD-10-CM | POA: Diagnosis not present

## 2017-07-23 DIAGNOSIS — N179 Acute kidney failure, unspecified: Secondary | ICD-10-CM | POA: Diagnosis not present

## 2017-07-23 DIAGNOSIS — M6282 Rhabdomyolysis: Secondary | ICD-10-CM | POA: Diagnosis not present

## 2017-07-24 ENCOUNTER — Ambulatory Visit: Payer: Medicare Other | Admitting: Physician Assistant

## 2017-07-25 DIAGNOSIS — I509 Heart failure, unspecified: Secondary | ICD-10-CM | POA: Diagnosis not present

## 2017-07-25 DIAGNOSIS — L89150 Pressure ulcer of sacral region, unstageable: Secondary | ICD-10-CM | POA: Diagnosis not present

## 2017-07-25 DIAGNOSIS — N179 Acute kidney failure, unspecified: Secondary | ICD-10-CM | POA: Diagnosis not present

## 2017-07-25 DIAGNOSIS — I2510 Atherosclerotic heart disease of native coronary artery without angina pectoris: Secondary | ICD-10-CM | POA: Diagnosis not present

## 2017-07-25 DIAGNOSIS — I10 Essential (primary) hypertension: Secondary | ICD-10-CM | POA: Diagnosis not present

## 2017-07-26 DIAGNOSIS — I5022 Chronic systolic (congestive) heart failure: Secondary | ICD-10-CM | POA: Diagnosis not present

## 2017-07-26 DIAGNOSIS — J449 Chronic obstructive pulmonary disease, unspecified: Secondary | ICD-10-CM | POA: Diagnosis not present

## 2017-07-26 DIAGNOSIS — N390 Urinary tract infection, site not specified: Secondary | ICD-10-CM | POA: Diagnosis not present

## 2017-07-26 DIAGNOSIS — K283 Acute gastrojejunal ulcer without hemorrhage or perforation: Secondary | ICD-10-CM | POA: Diagnosis not present

## 2017-07-30 DIAGNOSIS — K283 Acute gastrojejunal ulcer without hemorrhage or perforation: Secondary | ICD-10-CM | POA: Diagnosis not present

## 2017-07-30 DIAGNOSIS — I2510 Atherosclerotic heart disease of native coronary artery without angina pectoris: Secondary | ICD-10-CM | POA: Diagnosis not present

## 2017-07-30 DIAGNOSIS — G894 Chronic pain syndrome: Secondary | ICD-10-CM | POA: Diagnosis not present

## 2017-07-30 DIAGNOSIS — N390 Urinary tract infection, site not specified: Secondary | ICD-10-CM | POA: Diagnosis not present

## 2017-08-01 DIAGNOSIS — L89150 Pressure ulcer of sacral region, unstageable: Secondary | ICD-10-CM | POA: Diagnosis not present

## 2017-08-02 DIAGNOSIS — I509 Heart failure, unspecified: Secondary | ICD-10-CM | POA: Diagnosis not present

## 2017-08-02 DIAGNOSIS — N2581 Secondary hyperparathyroidism of renal origin: Secondary | ICD-10-CM | POA: Diagnosis not present

## 2017-08-02 DIAGNOSIS — D631 Anemia in chronic kidney disease: Secondary | ICD-10-CM | POA: Diagnosis not present

## 2017-08-02 DIAGNOSIS — I129 Hypertensive chronic kidney disease with stage 1 through stage 4 chronic kidney disease, or unspecified chronic kidney disease: Secondary | ICD-10-CM | POA: Diagnosis not present

## 2017-08-02 DIAGNOSIS — N184 Chronic kidney disease, stage 4 (severe): Secondary | ICD-10-CM | POA: Diagnosis not present

## 2017-08-06 DIAGNOSIS — K283 Acute gastrojejunal ulcer without hemorrhage or perforation: Secondary | ICD-10-CM | POA: Diagnosis not present

## 2017-08-06 DIAGNOSIS — N179 Acute kidney failure, unspecified: Secondary | ICD-10-CM | POA: Diagnosis not present

## 2017-08-06 DIAGNOSIS — J449 Chronic obstructive pulmonary disease, unspecified: Secondary | ICD-10-CM | POA: Diagnosis not present

## 2017-08-06 DIAGNOSIS — I5022 Chronic systolic (congestive) heart failure: Secondary | ICD-10-CM | POA: Diagnosis not present

## 2017-08-11 DIAGNOSIS — M899 Disorder of bone, unspecified: Secondary | ICD-10-CM | POA: Diagnosis not present

## 2017-08-11 DIAGNOSIS — Z13820 Encounter for screening for osteoporosis: Secondary | ICD-10-CM | POA: Diagnosis not present

## 2017-08-14 ENCOUNTER — Other Ambulatory Visit: Payer: Self-pay | Admitting: Physical Medicine & Rehabilitation

## 2017-08-23 DIAGNOSIS — I5032 Chronic diastolic (congestive) heart failure: Secondary | ICD-10-CM | POA: Diagnosis not present

## 2017-08-23 DIAGNOSIS — M816 Localized osteoporosis [Lequesne]: Secondary | ICD-10-CM | POA: Diagnosis not present

## 2017-08-23 DIAGNOSIS — R229 Localized swelling, mass and lump, unspecified: Secondary | ICD-10-CM | POA: Diagnosis not present

## 2017-08-23 DIAGNOSIS — K921 Melena: Secondary | ICD-10-CM | POA: Diagnosis not present

## 2017-08-28 ENCOUNTER — Encounter: Payer: Self-pay | Admitting: Physician Assistant

## 2017-08-28 ENCOUNTER — Other Ambulatory Visit: Payer: Self-pay | Admitting: Nurse Practitioner

## 2017-08-28 ENCOUNTER — Ambulatory Visit: Payer: Medicare Other | Admitting: Physician Assistant

## 2017-08-28 VITALS — BP 103/59 | HR 81 | Ht 64.0 in | Wt 126.2 lb

## 2017-08-28 DIAGNOSIS — I2510 Atherosclerotic heart disease of native coronary artery without angina pectoris: Secondary | ICD-10-CM | POA: Diagnosis not present

## 2017-08-28 DIAGNOSIS — D649 Anemia, unspecified: Secondary | ICD-10-CM | POA: Diagnosis not present

## 2017-08-28 DIAGNOSIS — N289 Disorder of kidney and ureter, unspecified: Secondary | ICD-10-CM | POA: Diagnosis not present

## 2017-08-28 DIAGNOSIS — R945 Abnormal results of liver function studies: Secondary | ICD-10-CM | POA: Diagnosis not present

## 2017-08-28 DIAGNOSIS — I10 Essential (primary) hypertension: Secondary | ICD-10-CM | POA: Diagnosis not present

## 2017-08-28 DIAGNOSIS — R7989 Other specified abnormal findings of blood chemistry: Secondary | ICD-10-CM

## 2017-08-28 DIAGNOSIS — R229 Localized swelling, mass and lump, unspecified: Secondary | ICD-10-CM

## 2017-08-28 DIAGNOSIS — E785 Hyperlipidemia, unspecified: Secondary | ICD-10-CM

## 2017-08-28 MED ORDER — PANTOPRAZOLE SODIUM 40 MG PO TBEC: 40.0000 mg | DELAYED_RELEASE_TABLET | Freq: Two times a day (BID) | ORAL | 3 refills | Status: DC

## 2017-08-28 MED ORDER — METOPROLOL TARTRATE 25 MG PO TABS: 25.0000 mg | ORAL_TABLET | Freq: Two times a day (BID) | ORAL | 1 refills | Status: DC

## 2017-08-28 NOTE — Progress Notes (Signed)
Cardiology Office Note    Date:  08/30/2017   ID:  Sara Fox, DOB [birth year], MRN [id number]  PCP:  [name] Chard, MD  Cardiologist:  Dr. Debara Pickett  Chief Complaint  Patient presents with  . Follow-up    seen for Dr. Debara Pickett. recent admission for rhabdo and GI bleed    History of Present Illness:  Sara Fox is a 77 y.o. female with PMH of HTN, HLD, subtotal gastrectomy, and CAD. He had NSTEMI on 09/14/2014 which showed 95% mid to distal RCA lesion treated with 3.0 x 33 mm Xience DES, 50% mid to distal left circumflex lesion, 50% ostial D2 lesion, EF 45-50%.  Patient was last seen by Dr. Debara Pickett on 04/12/2016, she was doing very well at the time.  She was on aspirin and Brilinta, Brilinta was discontinued. Patient was seen in the ED on 05/27/2017 for wrist pain.  She presented to the hospital again on 07/10/2017 and was admitted for weakness and confusion times 1 week.  Family reported multiple diarrhea throughout the week with dark stools.  She also had associated symptom of right leg pain, lower abdominal pain, dysuria and the lower extremity swelling.  Initial laboratory findings include white blood cell count of 9, hemoglobin 9.7, platelets 335.  Potassium 2.5, creatinine 3.82, AST 1239, ALT 843.  Urinalysis was positive for large hemoglobin. Last echocardiogram obtained on 07/04/2017 showed a normalization of EF 60-65%, mild LVH, grade 1 DD.  Patient was treated for acute renal failure.  With large hemoglobin in the urine and elevated liver function, it was concerning for rhabdomyolysis.  Total CK was 40,213.  Stool guaiac panel was weakly positive, patient was placed on Protonix IV for GI protection.  Acute encephalopathy was felt to be secondary to dehydration.  Nephrology was consulted to help managing renal function.  Both ACE inhibitor and statin were on hold.  GI was consulted, she underwent EGD which showed Candida esophagitis, marginal ulcer and food in the stomach.  This was treated with  Diflucan.  She was eventually discharged on 07/20/2017, her creatinine improved to 3.0 prior to discharge.  She had multiple issues during the recent hospitalization.  It is unclear to me why she was discharged on a statin even though she had significantly elevated transaminases.  I will temporarily hold her statin.  She will need a liver function test in 2 weeks, until her liver function completely normalized, I have no plan to restart the statin medication.  I will also check complete metabolic panel to check renal function.  She is also still on Brilinta which has been discontinued since 2017.  I have removed with this medication especially in light of the recent dark stools and anemia.  Her blood pressure is borderline, I have decreased her metoprolol to 25 mg twice daily.   Past Medical History:  Diagnosis Date  . Back pain   . Cervical cancer ([place])   . Hyperlipidemia   . Hypertension   . Neck pain   . Stomach problems     Past Surgical History:  Procedure Laterality Date  . ABDOMINAL HYSTERECTOMY    . BACK SURGERY  2012   lower back  . CARDIAC CATHETERIZATION  06/1999   noncritical disease invovling PDA  . CARDIAC CATHETERIZATION N/A 09/14/2014   Procedure: Left Heart Cath and Coronary Angiography;  Surgeon: Leonie Man, MD;  Location: [facility] INVASIVE CV LAB CUPID;  Service: Cardiovascular;  Laterality: N/A;  . CHOLECYSTECTOMY    . ESOPHAGOGASTRODUODENOSCOPY (  EGD) WITH PROPOFOL Left 07/19/2017   Procedure: ESOPHAGOGASTRODUODENOSCOPY (EGD) WITH PROPOFOL;  Surgeon: Ronnette Juniper, MD;  Location: [facility] ENDOSCOPY;  Service: Gastroenterology;  Laterality: Left;  . KNEE SURGERY Bilateral 2001 & 2007  . NECK SURGERY  2012   2012  . PARTIAL GASTRECTOMY  2005   subtotal  . PERCUTANEOUS CORONARY STENT INTERVENTION (PCI-S)  09/14/2014   Procedure: Percutaneous Coronary Stent Intervention (Pci-S);  Surgeon: Leonie Man, MD;  Location: [facility] INVASIVE CV LAB CUPID;  Service: Cardiovascular;;  . SHOULDER  SURGERY Right   . TRANSTHORACIC ECHOCARDIOGRAM  06/02/2010   EF=>55%, normal LV systolic function; normal RV systolic function; mild mitral annular calcif; trace TR; AV mildly sclerotic    Current Medications: Outpatient Medications Prior to Visit  Medication Sig Dispense Refill  . budesonide-formoterol (SYMBICORT) 160-4.5 MCG/ACT inhaler Inhale 2 puffs into the lungs 2 (two) times daily.    Marland Kitchen CALCIUM CITRATE PO Take 1 tablet by mouth daily.     . Cholecalciferol (VITAMIN D3) 2000 UNITS capsule Take 2,000 Units by mouth daily.    . fluconazole (DIFLUCAN) 50 MG tablet Take 1 tablet (50 mg total) by mouth daily.    Marland Kitchen gabapentin (NEURONTIN) 100 MG capsule Take 1 capsule (100 mg total) by mouth at bedtime. 30 capsule 1  . Iron Combinations (NIFEREX) TABS Take 150 mg by mouth daily.    . Multiple Vitamin (MULTIVITAMIN) capsule Take 1 capsule by mouth daily. Contains iron    . oxybutynin (DITROPAN) 5 MG tablet Take 2.5 mg by mouth 2 (two) times daily.    . traMADol (ULTRAM) 50 MG tablet TAKE 1 TO 2 TABLETS BY MOUTH EVERY 6 HOURS AS NEEDED 180 tablet 5  . vitamin C (ASCORBIC ACID) 500 MG tablet Take 500 mg by mouth daily.    . metoprolol tartrate (LOPRESSOR) 50 MG tablet Take 1 tablet (50 mg total) by mouth 2 (two) times daily with a meal. 60 tablet 0  . pantoprazole (PROTONIX) 40 MG tablet Take 1 tablet (40 mg total) by mouth 2 (two) times daily with a meal.    . rosuvastatin (CRESTOR) 40 MG tablet Take 1 tablet (40 mg total) by mouth daily at 6 PM. 30 tablet 0  . ticagrelor (BRILINTA) 90 MG TABS tablet Take 90 mg by mouth 2 (two) times daily.      No facility-administered medications prior to visit.      Allergies:   Buprenorphine hcl; Celebrex [celecoxib]; Morphine and related; Codeine; and Vioxx [rofecoxib]   Social History   Socioeconomic History  . Marital status: Married    Spouse name: Not on file  . Number of children: 2  . Years of education: GED  . Highest education level: Not  on file  Occupational History    Employer: RETIRED  Social Needs  . Financial resource strain: Not on file  . Food insecurity:    Worry: Not on file    Inability: Not on file  . Transportation needs:    Medical: Not on file    Non-medical: Not on file  Tobacco Use  . Smoking status: Current Every Day Smoker    Packs/day: 1.00    Years: 60.00    Pack years: 60.00  . Smokeless tobacco: Never Used  . Tobacco comment: currently smokes 1/2ppd or less  Substance and Sexual Activity  . Alcohol use: No  . Drug use: No  . Sexual activity: Not on file  Lifestyle  . Physical activity:    Days per week:  Not on file    Minutes per session: Not on file  . Stress: Not on file  Relationships  . Social connections:    Talks on phone: Not on file    Gets together: Not on file    Attends religious service: Not on file    Active member of club or organization: Not on file    Attends meetings of clubs or organizations: Not on file    Relationship status: Not on file  Other Topics Concern  . Not on file  Social History Narrative  . Not on file     Family History:  The patient's family history includes Colon cancer in her mother; Diabetes in her mother; Heart attack in her brother; Heart disease in her brother, brother, and mother; Hypertension in her brother and brother.   ROS:   Please see the history of present illness.    ROS All other systems reviewed and are negative.   PHYSICAL EXAM:   VS:  BP (!) 103/59   Pulse 81   Ht 5\' 4"  (1.626 m)   Wt 126 lb 3.2 oz (57.2 kg)   BMI 21.66 kg/m    GEN: Well nourished, well developed, in no acute distress  HEENT: normal  Neck: no JVD, carotid bruits, or masses Cardiac: RRR; no murmurs, rubs, or gallops,no edema  Respiratory:  clear to auscultation bilaterally, normal work of breathing GI: soft, nontender, nondistended, + BS MS: no deformity or atrophy  Skin: warm and dry, no rash Neuro:  Alert and Oriented x 3, Strength and sensation  are intact Psych: euthymic mood, full affect  Wt Readings from Last 3 Encounters:  08/28/17 126 lb 3.2 oz (57.2 kg)  07/20/17 167 lb (75.8 kg)  12/21/16 147 lb (66.7 kg)      Studies/Labs Reviewed:   EKG:  EKG is ordered today.  The ekg ordered today demonstrates normal sinus rhythm without significant ST-T wave changes  Recent Labs: 07/10/2017: TSH 0.926 07/18/2017: ALT 348 07/20/2017: BUN 65; Creatinine, Ser 3.00; Hemoglobin 7.9; Magnesium 1.8; Platelets 359; Potassium 3.5; Sodium 140   Lipid Panel    Component Value Date/Time   CHOL 158 09/12/2014 1041   TRIG 61 09/12/2014 1041   HDL 45 09/12/2014 1041   CHOLHDL 3.5 09/12/2014 1041   VLDL 12 09/12/2014 1041   LDLCALC 101 (H) 09/12/2014 1041    Additional studies/ records that were reviewed today include:   Cath 09/14/2014 Conclusion    Severe single vessel CAD of the mid-distal RCA  Mid RCA to Dist RCA lesion, 95% stenosed. There is a 0% residual stenosis post intervention.  Dist RCA lesion, 80% stenosed.  A drug-eluting stent was placed. Xience Alpine DES 3.0 mm x 33 mm (3.25 mm)  Mid RCA lesion, 30% stenosed.  Mid Cx to Dist Cx lesion, 50% stenosed.  Ost 2nd Diag to 2nd Diag lesion, 50% stenosed.  Mildly reduced LVEF with mildly elevated LVEDP & Systemic HTN   The patient was admitted with signs and symptoms unstable angina/acute coronary syndrome. She had mild troponin elevation it was evaluated for risk stratification with a Nuclear Stress Test That Revealed Inferior Ischemia and a "High Risk " finding.  She was referred for invasive evaluation with cardiac catheterization plus minus PCI.  Catheterization revealed severe single-vessel disease of the RCA with moderate disease in the left circumflex complex and LAD. She underwent uncomplicated long (class C lesion) PCI covering 2 lesions with one long Xience Alpine DES stent  Recommend dual  independent therapy for minimum one year - can convert from Brilinta  to Plavix if necessary Continue aggressive risk factor modification including antihypertensive control.  Anticipate discharge tomorrow if stable.      Echo 07/12/2017 LV EF: 60% -   65% Study Conclusions  - Left ventricle: The cavity size was normal. There was mild   concentric hypertrophy. Systolic function was normal. The   estimated ejection fraction was in the range of 60% to 65%. Wall   motion was normal; there were no regional wall motion   abnormalities. Doppler parameters are consistent with abnormal   left ventricular relaxation (grade 1 diastolic dysfunction). - Mitral valve: Calcified annulus. - Atrial septum: No defect or patent foramen ovale was identified.   ASSESSMENT:    1. Coronary artery disease involving native coronary artery of native heart without angina pectoris   2. Elevated liver function tests   3. Anemia, unspecified type   4. Abnormal kidney function   5. Essential hypertension   6. Hyperlipidemia, unspecified hyperlipidemia type      PLAN:  In order of problems listed above:  1. CAD:  Hold statin given the recent rhabdomyolysis.  She is not on aspirin given recent GI bleed.  I plan to restart aspirin on the next office visit.  2. Rhabdomyolysis with elevated transaminases: Hold Crestor, obtain complete metabolic panel in 2 weeks, once liver enzymes returned back to normal, I will start at very low dose statin with close outpatient monitoring.    3. Anemia: Obtain CBC.  She presented to the hospital with multiple diarrhea with dark tarry stool.  Discontinue Brilinta  4. Hypertension: Blood pressure borderline, reduce metoprolol to 25 mg twice daily.  5. Acute kidney injury: Recheck complete metabolic panel  6. Hyperlipidemia: Off statin for now, repeat liver function test in 2 weeks, once liver enzymes returned back to normal, started on low-dose statin with a close outpatient monitoring.    Medication Adjustments/Labs and Tests  Ordered: Current medicines are reviewed at length with the patient today.  Concerns regarding medicines are outlined above.  Medication changes, Labs and Tests ordered today are listed in the Patient Instructions below. Patient Instructions  Medication Instructions:  STOP BRILINTA   HOLD YOUR CRESTOR (ROSUVASTATIN) UNTIL YOU ARE ADVISED TO RESUME  DECREASE METOPROLOL TO 25 MG TWICE A DAY   Labwork: FASTING CMET/CBC IN 2 WEEKS  Testing/Procedures: NONE  Follow-Up: Your physician recommends that you schedule a follow-up appointment in: 3 MONTHS WITH DR HILTY    Coping with Quitting Smoking Quitting smoking is a physical and mental challenge. You will face cravings, withdrawal symptoms, and temptation. Before quitting, work with your health care provider to make a plan that can help you cope. Preparation can help you quit and keep you from giving in. How can I cope with cravings? Cravings usually last for 5-10 minutes. If you get through it, the craving will pass. Consider taking the following actions to help you cope with cravings:  Keep your mouth busy: ? Chew sugar-free gum. ? Suck on hard candies or a straw. ? Brush your teeth.  Keep your hands and body busy: ? Immediately change to a different activity when you feel a craving. ? Squeeze or play with a ball. ? Do an activity or a hobby, like making bead jewelry, practicing needlepoint, or working with wood. ? Mix up your normal routine. ? Take a short exercise break. Go for a quick walk or run up and down stairs. ? Spend  time in public places where smoking is not allowed.  Focus on doing something kind or helpful for someone else.  Call a friend or family member to talk during a craving.  Join a support group.  Call a quit line, such as 1-800-QUIT-NOW.  Talk with your health care provider about medicines that might help you cope with cravings and make quitting easier for you.  How can I deal with withdrawal  symptoms? Your body may experience negative effects as it tries to get used to not having nicotine in the system. These effects are called withdrawal symptoms. They may include:  Feeling hungrier than normal.  Trouble concentrating.  Irritability.  Trouble sleeping.  Feeling depressed.  Restlessness and agitation.  Craving a cigarette.  To manage withdrawal symptoms:  Avoid places, people, and activities that trigger your cravings.  Remember why you want to quit.  Get plenty of sleep.  Avoid coffee and other caffeinated drinks. These may worsen some of your symptoms.  How can I handle social situations? Social situations can be difficult when you are quitting smoking, especially in the first few weeks. To manage this, you can:  Avoid parties, bars, and other social situations where people might be smoking.  Avoid alcohol.  Leave right away if you have the urge to smoke.  Explain to your family and friends that you are quitting smoking. Ask for understanding and support.  Plan activities with friends or family where smoking is not an option.  What are some ways I can cope with stress? Wanting to smoke may cause stress, and stress can make you want to smoke. Find ways to manage your stress. Relaxation techniques can help. For example:  Breathe slowly and deeply, in through your nose and out through your mouth.  Listen to soothing, relaxing music.  Talk with a family member or friend about your stress.  Light a candle.  Soak in a bath or take a shower.  Think about a peaceful place.  What are some ways I can prevent weight gain? Be aware that many people gain weight after they quit smoking. However, not everyone does. To keep from gaining weight, have a plan in place before you quit and stick to the plan after you quit. Your plan should include:  Having healthy snacks. When you have a craving, it may help to: ? Eat plain popcorn, crunchy carrots, celery, or  other cut vegetables. ? Chew sugar-free gum.  Changing how you eat: ? Eat small portion sizes at meals. ? Eat 4-6 small meals throughout the day instead of 1-2 large meals a day. ? Be mindful when you eat. Do not watch television or do other things that might distract you as you eat.  Exercising regularly: ? Make time to exercise each day. If you do not have time for a long workout, do short bouts of exercise for 5-10 minutes several times a day. ? Do some form of strengthening exercise, like weight lifting, and some form of aerobic exercise, like running or swimming.  Drinking plenty of water or other low-calorie or no-calorie drinks. Drink 6-8 glasses of water daily, or as much as instructed by your health care provider.  Summary  Quitting smoking is a physical and mental challenge. You will face cravings, withdrawal symptoms, and temptation to smoke again. Preparation can help you as you go through these challenges.  You can cope with cravings by keeping your mouth busy (such as by chewing gum), keeping your body and hands busy,  and making calls to family, friends, or a helpline for people who want to quit smoking.  You can cope with withdrawal symptoms by avoiding places where people smoke, avoiding drinks with caffeine, and getting plenty of rest.  Ask your health care provider about the different ways to prevent weight gain, avoid stress, and handle social situations. This information is not intended to replace advice given to you by your health care provider. Make sure you discuss any questions you have with your health care provider. Document Released: 04/28/2016 Document Revised: 04/28/2016 Document Reviewed: 04/28/2016 Elsevier Interactive Patient Education  2018 [place], [place], [place]  08/30/2017 3:41 PM    [facility] [address], [place], [place]  [address] Phone: [phone]; Fax: [phone]

## 2017-08-28 NOTE — Patient Instructions (Addendum)
Medication Instructions:  STOP BRILINTA   HOLD YOUR CRESTOR (ROSUVASTATIN) UNTIL YOU ARE ADVISED TO RESUME  DECREASE METOPROLOL TO 25 MG TWICE A DAY   Labwork: FASTING CMET/CBC IN 2 WEEKS  Testing/Procedures: NONE  Follow-Up: Your physician recommends that you schedule a follow-up appointment in: 3 MONTHS WITH DR HILTY    Coping with Quitting Smoking Quitting smoking is a physical and mental challenge. You will face cravings, withdrawal symptoms, and temptation. Before quitting, work with your health care provider to make a plan that can help you cope. Preparation can help you quit and keep you from giving in. How can I cope with cravings? Cravings usually last for 5-10 minutes. If you get through it, the craving will pass. Consider taking the following actions to help you cope with cravings:  Keep your mouth busy: ? Chew sugar-free gum. ? Suck on hard candies or a straw. ? Brush your teeth.  Keep your hands and body busy: ? Immediately change to a different activity when you feel a craving. ? Squeeze or play with a ball. ? Do an activity or a hobby, like making bead jewelry, practicing needlepoint, or working with wood. ? Mix up your normal routine. ? Take a short exercise break. Go for a quick walk or run up and down stairs. ? Spend time in public places where smoking is not allowed.  Focus on doing something kind or helpful for someone else.  Call a friend or family member to talk during a craving.  Join a support group.  Call a quit line, such as 1-800-QUIT-NOW.  Talk with your health care provider about medicines that might help you cope with cravings and make quitting easier for you.  How can I deal with withdrawal symptoms? Your body may experience negative effects as it tries to get used to not having nicotine in the system. These effects are called withdrawal symptoms. They may include:  Feeling hungrier than normal.  Trouble  concentrating.  Irritability.  Trouble sleeping.  Feeling depressed.  Restlessness and agitation.  Craving a cigarette.  To manage withdrawal symptoms:  Avoid places, people, and activities that trigger your cravings.  Remember why you want to quit.  Get plenty of sleep.  Avoid coffee and other caffeinated drinks. These may worsen some of your symptoms.  How can I handle social situations? Social situations can be difficult when you are quitting smoking, especially in the first few weeks. To manage this, you can:  Avoid parties, bars, and other social situations where people might be smoking.  Avoid alcohol.  Leave right away if you have the urge to smoke.  Explain to your family and friends that you are quitting smoking. Ask for understanding and support.  Plan activities with friends or family where smoking is not an option.  What are some ways I can cope with stress? Wanting to smoke may cause stress, and stress can make you want to smoke. Find ways to manage your stress. Relaxation techniques can help. For example:  Breathe slowly and deeply, in through your nose and out through your mouth.  Listen to soothing, relaxing music.  Talk with a family member or friend about your stress.  Light a candle.  Soak in a bath or take a shower.  Think about a peaceful place.  What are some ways I can prevent weight gain? Be aware that many people gain weight after they quit smoking. However, not everyone does. To keep from gaining weight, have a plan in  place before you quit and stick to the plan after you quit. Your plan should include:  Having healthy snacks. When you have a craving, it may help to: ? Eat plain popcorn, crunchy carrots, celery, or other cut vegetables. ? Chew sugar-free gum.  Changing how you eat: ? Eat small portion sizes at meals. ? Eat 4-6 small meals throughout the day instead of 1-2 large meals a day. ? Be mindful when you eat. Do not watch  television or do other things that might distract you as you eat.  Exercising regularly: ? Make time to exercise each day. If you do not have time for a long workout, do short bouts of exercise for 5-10 minutes several times a day. ? Do some form of strengthening exercise, like weight lifting, and some form of aerobic exercise, like running or swimming.  Drinking plenty of water or other low-calorie or no-calorie drinks. Drink 6-8 glasses of water daily, or as much as instructed by your health care provider.  Summary  Quitting smoking is a physical and mental challenge. You will face cravings, withdrawal symptoms, and temptation to smoke again. Preparation can help you as you go through these challenges.  You can cope with cravings by keeping your mouth busy (such as by chewing gum), keeping your body and hands busy, and making calls to family, friends, or a helpline for people who want to quit smoking.  You can cope with withdrawal symptoms by avoiding places where people smoke, avoiding drinks with caffeine, and getting plenty of rest.  Ask your health care provider about the different ways to prevent weight gain, avoid stress, and handle social situations. This information is not intended to replace advice given to you by your health care provider. Make sure you discuss any questions you have with your health care provider. Document Released: 04/28/2016 Document Revised: 04/28/2016 Document Reviewed: 04/28/2016 Elsevier Interactive Patient Education  [employer].

## 2017-08-29 ENCOUNTER — Other Ambulatory Visit: Payer: Self-pay | Admitting: Internal Medicine

## 2017-08-29 DIAGNOSIS — M810 Age-related osteoporosis without current pathological fracture: Secondary | ICD-10-CM

## 2017-08-30 ENCOUNTER — Encounter: Payer: Self-pay | Admitting: Physician Assistant

## 2017-09-07 DIAGNOSIS — R945 Abnormal results of liver function studies: Secondary | ICD-10-CM | POA: Diagnosis not present

## 2017-09-07 DIAGNOSIS — N289 Disorder of kidney and ureter, unspecified: Secondary | ICD-10-CM | POA: Diagnosis not present

## 2017-09-07 DIAGNOSIS — D649 Anemia, unspecified: Secondary | ICD-10-CM | POA: Diagnosis not present

## 2017-09-08 LAB — CBC WITH DIFFERENTIAL/PLATELET
Basophils Absolute: 0 10*3/uL (ref 0.0–0.2)
Basos: 0 %
EOS (ABSOLUTE): 0.1 10*3/uL (ref 0.0–0.4)
Eos: 2 %
Hematocrit: 27.1 % — ABNORMAL LOW (ref 34.0–46.6)
Hemoglobin: 8.7 g/dL — ABNORMAL LOW (ref 11.1–15.9)
Immature Grans (Abs): 0 10*3/uL (ref 0.0–0.1)
Immature Granulocytes: 0 %
Lymphocytes Absolute: 2.6 10*3/uL (ref 0.7–3.1)
Lymphs: 39 %
MCH: 31 pg (ref 26.6–33.0)
MCHC: 32.1 g/dL (ref 31.5–35.7)
MCV: 96 fL (ref 79–97)
Monocytes Absolute: 0.8 10*3/uL (ref 0.1–0.9)
Monocytes: 12 %
Neutrophils Absolute: 3 10*3/uL (ref 1.4–7.0)
Neutrophils: 47 %
Platelets: 382 10*3/uL — ABNORMAL HIGH (ref 150–379)
RBC: 2.81 x10E6/uL — ABNORMAL LOW (ref 3.77–5.28)
RDW: 14.2 % (ref 12.3–15.4)
WBC: 6.6 10*3/uL (ref 3.4–10.8)

## 2017-09-08 LAB — COMPREHENSIVE METABOLIC PANEL
ALT: 14 IU/L (ref 0–32)
AST: 16 IU/L (ref 0–40)
Albumin/Globulin Ratio: 1.6 (ref 1.2–2.2)
Albumin: 3.7 g/dL (ref 3.5–4.8)
Alkaline Phosphatase: 121 IU/L — ABNORMAL HIGH (ref 39–117)
BUN/Creatinine Ratio: 15 (ref 12–28)
BUN: 26 mg/dL (ref 8–27)
Bilirubin Total: <0.2 mg/dL (ref 0.0–1.2)
CO2: 23 mmol/L (ref 20–29)
Calcium: 9.1 mg/dL (ref 8.7–10.3)
Chloride: 103 mmol/L (ref 96–106)
Creatinine, Ser: 1.71 mg/dL — ABNORMAL HIGH (ref 0.57–1.00)
GFR calc Af Amer: 33 mL/min/{1.73_m2} — ABNORMAL LOW (ref >59)
GFR calc non Af Amer: 28 mL/min/{1.73_m2} — ABNORMAL LOW (ref >59)
Globulin, Total: 2.3 g/dL (ref 1.5–4.5)
Glucose: 78 mg/dL (ref 65–99)
Potassium: 4.6 mmol/L (ref 3.5–5.2)
Sodium: 139 mmol/L (ref 134–144)
Total Protein: 6 g/dL (ref 6.0–8.5)

## 2017-09-08 LAB — SPECIMEN STATUS REPORT

## 2017-09-13 ENCOUNTER — Ambulatory Visit: Payer: Medicare Other | Admitting: Physical Medicine & Rehabilitation

## 2017-09-13 ENCOUNTER — Encounter: Payer: Medicare Other | Attending: Physical Medicine & Rehabilitation

## 2017-10-11 ENCOUNTER — Telehealth: Payer: Self-pay

## 2017-10-11 NOTE — Telephone Encounter (Signed)
   [facility] Pre-operative Risk Assessment    Request for surgical clearance:  1. What type of surgery is being performed? Left wrist hardware removal, left wrist ulna resection with ECU Stabliization   2. When is this surgery scheduled? 11/07/17   3. What type of clearance is required (medical clearance vs. Pharmacy clearance to hold med vs. Both)? Medical  4. Are there any medications that need to be held prior to surgery and how long?None   5. Practice name and name of physician performing surgery? [facility]- Dr. Charlotte Crumb   6. What is your office phone number [phone]    7.   What is your office fax number [phone] ATTN: Cyndee Brightly  8.   Anesthesia type (None, local, MAC, general) ? General   Meryl Crutch 10/11/2017, 5:08 PM  _________________________________________________________________   (provider comments below)

## 2017-10-12 NOTE — Telephone Encounter (Signed)
   Primary Cardiologist: Pixie Casino, MD  Chart reviewed as part of pre-operative protocol coverage. Patient was contacted 10/12/2017 in reference to pre-operative risk assessment for pending surgery as outlined below.  Sara Fox was last seen on 08/28/2017 by Almyra Deforest, PAC.  Since that day, Sara Fox has done well.  She is not having chest pain. She denies LE edema, orthopnea or PND. Her weight is 137, she feels this is a good weight for her  Therefore, based on ACC/AHA guidelines, the patient would be at acceptable risk for the planned procedure without further cardiovascular testing.   I will route this recommendation to the requesting party via Epic fax function and remove from pre-op pool.  Please call with questions.  Rosaria Ferries, PA-C 10/12/2017, 3:52 PM

## 2017-11-02 ENCOUNTER — Other Ambulatory Visit: Payer: Self-pay | Admitting: Orthopedic Surgery

## 2017-11-06 ENCOUNTER — Encounter (HOSPITAL_COMMUNITY): Payer: Self-pay | Admitting: Emergency Medicine

## 2017-11-06 ENCOUNTER — Other Ambulatory Visit: Payer: Self-pay

## 2017-11-06 MED ORDER — CEFAZOLIN SODIUM-DEXTROSE 2-4 GM/100ML-% IV SOLN
2.0000 g | INTRAVENOUS | Status: AC
  Administered 2017-11-07: 2 g via INTRAVENOUS
  Filled 2017-11-06: qty 100

## 2017-11-06 NOTE — Progress Notes (Signed)
Spoke with pt for pre-op call. Pt has hx of CAD with stents and CHF. Dr. Debara Pickett is her cardiologist and she has cardiac clearance from his office. Pt had Rhabdomyolosis in April, 2019 and had acute Kidney injury. Pt states she sees Dr. Posey Pronto for nephrology. Last visit was in April and will see him in July. Pt denies any recent chest pain. Pt instructed not to smoke as of now until after surgery.

## 2017-11-06 NOTE — Progress Notes (Signed)
Anesthesia Chart Review:  Pt is a same day work up   Case:  [id number] Date/Time:  11/07/17 1345   Procedures:      LEFT WRIST HARDWARE REMOVAL (Left )     LEFT WRIST DISTAL ULNA RESECTION WITH EXTENSOR CARPI ULNARIS STABILIZATION (Left )   Anesthesia type:  General   Pre-op diagnosis:  LEFT WRIST IMPACTION SYNDROME   Location:  [facility] OR ROOM 03 / [facility]   Surgeon:  Charlotte Crumb, MD      DISCUSSION: - Pt is a 76 year old female with hx CAD (DES to RCA x2 2016), HTN.   - Hospitalized 2/26-07/20/17 for acute renal failure due to rhabdomyolysis and ACE inhibitor. Baseline Cr reportedly normal. Cr 3.87 on admission, improved to 3.0 by discharge. Complicated by blood loss anemia (guaiac positive stool; EGD found no bleeding culprit; s/p 1 unit PRBCs) and lumbar sacral pressure ulcer  - Labs 09/07/17 show Cr had improved to 1.7.   - Has cardiac clearance for surgery   PROVIDERS: PCP is [name] Chard, MD Cardiologist is Lyman Bishop, MD. Last office visit 08/28/17 with Almyra Deforest, [place].  Cleared for surgery 10/12/17 by Rosaria Ferries, PA   LABS: Will be obtained day of surgery    EKG 08/28/17: NSR. PAC. Poor R wave progression    CV:  Echo 07/12/17:  - Left ventricle: The cavity size was normal. There was mild concentric hypertrophy. Systolic function was normal. The estimated ejection fraction was in the range of 60% to 65%. Wall motion was normal; there were no regional wall motion abnormalities. Doppler parameters are consistent with abnormal left ventricular relaxation (grade 1 diastolic dysfunction). - Mitral valve: Calcified annulus. - Atrial septum: No defect or patent foramen ovale was identified.  Cardiac cath 09/14/14:   Severe single vessel CAD of the mid-distal RCA  Mid RCA to Dist RCA lesion, 95% stenosed. There is a 0% residual stenosis post intervention.  Dist RCA lesion, 80% stenosed.  A drug-eluting stent was placed. Xience Alpine DES 3.0 mm x 33 mm (3.25 mm)  Mid RCA  lesion, 30% stenosed.  Mid Cx to Dist Cx lesion, 50% stenosed.  Ost 2nd Diag to 2nd Diag lesion, 50% stenosed.  Mildly reduced LVEF with mildly elevated LVEDP & Systemic HTN - Catheterization revealed severe single-vessel disease of the RCA with moderate disease in the left circumflex complex and LAD. - She underwent uncomplicated long (class C lesion) PCI covering 2 lesions with one long Xience Alpine DES stent    Past Medical History:  Diagnosis Date  . Back pain   . Cervical cancer ([place])   . Coronary artery disease    s/p DES x2 to RCA 2016  . Hyperlipidemia   . Hypertension   . Neck pain   . Stomach problems     Past Surgical History:  Procedure Laterality Date  . ABDOMINAL HYSTERECTOMY    . BACK SURGERY  2012   lower back  . CARDIAC CATHETERIZATION  06/1999   noncritical disease invovling PDA  . CARDIAC CATHETERIZATION N/A 09/14/2014   Procedure: Left Heart Cath and Coronary Angiography;  Surgeon: Leonie Man, MD;  Location: [facility] INVASIVE CV LAB CUPID;  Service: Cardiovascular;  Laterality: N/A;  . CHOLECYSTECTOMY    . ESOPHAGOGASTRODUODENOSCOPY (EGD) WITH PROPOFOL Left 07/19/2017   Procedure: ESOPHAGOGASTRODUODENOSCOPY (EGD) WITH PROPOFOL;  Surgeon: Ronnette Juniper, MD;  Location: [facility] ENDOSCOPY;  Service: Gastroenterology;  Laterality: Left;  . KNEE SURGERY Bilateral 2001 & 2007  . NECK SURGERY  2012   2012  . PARTIAL GASTRECTOMY  2005   subtotal  . PERCUTANEOUS CORONARY STENT INTERVENTION (PCI-S)  09/14/2014   Procedure: Percutaneous Coronary Stent Intervention (Pci-S);  Surgeon: Leonie Man, MD;  Location: [facility] INVASIVE CV LAB CUPID;  Service: Cardiovascular;;  . SHOULDER SURGERY Right   . TRANSTHORACIC ECHOCARDIOGRAM  06/02/2010   EF=>55%, normal LV systolic function; normal RV systolic function; mild mitral annular calcif; trace TR; AV mildly sclerotic    MEDICATIONS: . [START ON 11/07/2017] ceFAZolin (ANCEF) IVPB 2g/100 mL premix   . budesonide-formoterol  (SYMBICORT) 160-4.5 MCG/ACT inhaler  . CALCIUM CITRATE PO  . Cholecalciferol (VITAMIN D3) 2000 UNITS capsule  . fluconazole (DIFLUCAN) 50 MG tablet  . gabapentin (NEURONTIN) 100 MG capsule  . Iron Combinations (NIFEREX) TABS  . metoprolol tartrate (LOPRESSOR) 25 MG tablet  . Multiple Vitamin (MULTIVITAMIN) capsule  . oxybutynin (DITROPAN) 5 MG tablet  . pantoprazole (PROTONIX) 40 MG tablet  . traMADol (ULTRAM) 50 MG tablet  . vitamin C (ASCORBIC ACID) 500 MG tablet    If labs acceptable day of surgery, I anticipate pt can proceed with surgery as scheduled.  Willeen Cass, FNP-BC [facility]/Anesthesiology Phone: [phone] 11/06/2017 1:32 PM

## 2017-11-07 ENCOUNTER — Ambulatory Visit (HOSPITAL_COMMUNITY): Payer: Medicare Other | Admitting: Emergency Medicine

## 2017-11-07 ENCOUNTER — Ambulatory Visit (HOSPITAL_COMMUNITY)
Admission: RE | Admit: 2017-11-07 | Discharge: 2017-11-07 | Disposition: A | Discharge status: Home / Self Care | Payer: Medicare Other | Source: Ambulatory Visit | Attending: Orthopedic Surgery | Admitting: Orthopedic Surgery

## 2017-11-07 ENCOUNTER — Encounter (HOSPITAL_COMMUNITY): Payer: Self-pay | Admitting: Urology

## 2017-11-07 ENCOUNTER — Encounter (HOSPITAL_COMMUNITY)
Admission: RE | Disposition: A | Discharge status: Home / Self Care | Payer: Self-pay | Source: Ambulatory Visit | Attending: Orthopedic Surgery

## 2017-11-07 DIAGNOSIS — Z903 Acquired absence of stomach [part of]: Secondary | ICD-10-CM | POA: Insufficient documentation

## 2017-11-07 DIAGNOSIS — I509 Heart failure, unspecified: Secondary | ICD-10-CM | POA: Insufficient documentation

## 2017-11-07 DIAGNOSIS — T8484XA Pain due to internal orthopedic prosthetic devices, implants and grafts, initial encounter: Secondary | ICD-10-CM | POA: Diagnosis not present

## 2017-11-07 DIAGNOSIS — Z7902 Long term (current) use of antithrombotics/antiplatelets: Secondary | ICD-10-CM | POA: Insufficient documentation

## 2017-11-07 DIAGNOSIS — Y831 Surgical operation with implant of artificial internal device as the cause of abnormal reaction of the patient, or of later complication, without mention of misadventure at the time of the procedure: Secondary | ICD-10-CM | POA: Diagnosis not present

## 2017-11-07 DIAGNOSIS — I2510 Atherosclerotic heart disease of native coronary artery without angina pectoris: Secondary | ICD-10-CM | POA: Insufficient documentation

## 2017-11-07 DIAGNOSIS — I110 Hypertensive heart disease with heart failure: Secondary | ICD-10-CM | POA: Diagnosis not present

## 2017-11-07 DIAGNOSIS — I252 Old myocardial infarction: Secondary | ICD-10-CM | POA: Diagnosis not present

## 2017-11-07 DIAGNOSIS — Z7951 Long term (current) use of inhaled steroids: Secondary | ICD-10-CM | POA: Diagnosis not present

## 2017-11-07 DIAGNOSIS — Z8541 Personal history of malignant neoplasm of cervix uteri: Secondary | ICD-10-CM | POA: Diagnosis not present

## 2017-11-07 DIAGNOSIS — Z955 Presence of coronary angioplasty implant and graft: Secondary | ICD-10-CM | POA: Diagnosis not present

## 2017-11-07 DIAGNOSIS — K219 Gastro-esophageal reflux disease without esophagitis: Secondary | ICD-10-CM | POA: Insufficient documentation

## 2017-11-07 DIAGNOSIS — J449 Chronic obstructive pulmonary disease, unspecified: Secondary | ICD-10-CM | POA: Diagnosis not present

## 2017-11-07 DIAGNOSIS — Z79899 Other long term (current) drug therapy: Secondary | ICD-10-CM | POA: Diagnosis not present

## 2017-11-07 DIAGNOSIS — F17210 Nicotine dependence, cigarettes, uncomplicated: Secondary | ICD-10-CM | POA: Insufficient documentation

## 2017-11-07 DIAGNOSIS — M24832 Other specific joint derangements of left wrist, not elsewhere classified: Secondary | ICD-10-CM | POA: Insufficient documentation

## 2017-11-07 DIAGNOSIS — M25532 Pain in left wrist: Secondary | ICD-10-CM | POA: Diagnosis present

## 2017-11-07 HISTORY — DX: Atherosclerotic heart disease of native coronary artery without angina pectoris: I25.10

## 2017-11-07 HISTORY — DX: Chronic obstructive pulmonary disease, unspecified: J44.9

## 2017-11-07 HISTORY — DX: Gastro-esophageal reflux disease without esophagitis: K21.9

## 2017-11-07 HISTORY — DX: Acute kidney failure, unspecified: N17.9

## 2017-11-07 HISTORY — PX: WRIST OSTEOTOMY: SHX1093

## 2017-11-07 HISTORY — DX: Acute myocardial infarction, unspecified: I21.9

## 2017-11-07 HISTORY — DX: Rhabdomyolysis: M62.82

## 2017-11-07 HISTORY — DX: Heart failure, unspecified: I50.9

## 2017-11-07 HISTORY — PX: HARDWARE REMOVAL: SHX979

## 2017-11-07 LAB — BASIC METABOLIC PANEL
Anion gap: 8 (ref 5–15)
BUN: 19 mg/dL (ref 8–23)
CO2: 24 mmol/L (ref 22–32)
Calcium: 9.2 mg/dL (ref 8.9–10.3)
Chloride: 109 mmol/L (ref 98–111)
Creatinine, Ser: 1.24 mg/dL — ABNORMAL HIGH (ref 0.44–1.00)
GFR calc Af Amer: 47 mL/min — ABNORMAL LOW (ref >60)
GFR calc non Af Amer: 41 mL/min — ABNORMAL LOW (ref >60)
Glucose, Bld: 98 mg/dL (ref 70–99)
Potassium: 4.1 mmol/L (ref 3.5–5.1)
Sodium: 141 mmol/L (ref 135–145)

## 2017-11-07 LAB — CBC
HCT: 33.6 % — ABNORMAL LOW (ref 36.0–46.0)
Hemoglobin: 10.2 g/dL — ABNORMAL LOW (ref 12.0–15.0)
MCH: 30.4 pg (ref 26.0–34.0)
MCHC: 30.4 g/dL (ref 30.0–36.0)
MCV: 100.3 fL — ABNORMAL HIGH (ref 78.0–100.0)
Platelets: 344 10*3/uL (ref 150–400)
RBC: 3.35 MIL/uL — ABNORMAL LOW (ref 3.87–5.11)
RDW: 13.2 % (ref 11.5–15.5)
WBC: 4.9 10*3/uL (ref 4.0–10.5)

## 2017-11-07 SURGERY — REMOVAL, HARDWARE
Anesthesia: Regional | Site: Wrist | Laterality: Left

## 2017-11-07 MED ORDER — PHENYLEPHRINE HCL 10 MG/ML IJ SOLN
INTRAMUSCULAR | Status: DC | PRN
  Administered 2017-11-07 (×4): 80 ug via INTRAVENOUS

## 2017-11-07 MED ORDER — PROPOFOL 10 MG/ML IV BOLUS
INTRAVENOUS | Status: AC
  Filled 2017-11-07: qty 20

## 2017-11-07 MED ORDER — FENTANYL CITRATE (PF) 100 MCG/2ML IJ SOLN
75.0000 ug | Freq: Once | INTRAMUSCULAR | Status: AC
  Administered 2017-11-07: 75 ug via INTRAVENOUS

## 2017-11-07 MED ORDER — CLONIDINE HCL (ANALGESIA) 100 MCG/ML EP SOLN
EPIDURAL | Status: DC | PRN
  Administered 2017-11-07: 50 ug

## 2017-11-07 MED ORDER — PROPOFOL 10 MG/ML IV BOLUS
INTRAVENOUS | Status: DC | PRN
  Administered 2017-11-07: 80 mg via INTRAVENOUS

## 2017-11-07 MED ORDER — PHENYLEPHRINE 40 MCG/ML (10ML) SYRINGE FOR IV PUSH (FOR BLOOD PRESSURE SUPPORT)
PREFILLED_SYRINGE | INTRAVENOUS | Status: AC
  Filled 2017-11-07: qty 10

## 2017-11-07 MED ORDER — FENTANYL CITRATE (PF) 100 MCG/2ML IJ SOLN
INTRAMUSCULAR | Status: AC
  Administered 2017-11-07: 75 ug via INTRAVENOUS
  Filled 2017-11-07: qty 2

## 2017-11-07 MED ORDER — CHLORHEXIDINE GLUCONATE 4 % EX LIQD: 60.0000 mL | Freq: Once | CUTANEOUS | Status: DC

## 2017-11-07 MED ORDER — LACTATED RINGERS IV SOLN
INTRAVENOUS | Status: DC
  Administered 2017-11-07: 12:00:00 via INTRAVENOUS

## 2017-11-07 MED ORDER — DEXAMETHASONE SODIUM PHOSPHATE 10 MG/ML IJ SOLN
INTRAMUSCULAR | Status: DC | PRN
  Administered 2017-11-07: 4 mg via INTRAVENOUS

## 2017-11-07 MED ORDER — PROMETHAZINE HCL 25 MG/ML IJ SOLN: 6.2500 mg | INTRAMUSCULAR | Status: DC | PRN

## 2017-11-07 MED ORDER — ONDANSETRON HCL 4 MG/2ML IJ SOLN
INTRAMUSCULAR | Status: AC
Start: 2017-11-07 — End: ?
  Filled 2017-11-07: qty 2

## 2017-11-07 MED ORDER — LIDOCAINE HCL (CARDIAC) PF 100 MG/5ML IV SOSY
PREFILLED_SYRINGE | INTRAVENOUS | Status: DC | PRN
  Administered 2017-11-07: 60 mg via INTRAVENOUS

## 2017-11-07 MED ORDER — ROCURONIUM BROMIDE 50 MG/5ML IV SOLN
INTRAVENOUS | Status: AC
  Filled 2017-11-07: qty 1

## 2017-11-07 MED ORDER — MIDAZOLAM HCL 2 MG/2ML IJ SOLN
INTRAMUSCULAR | Status: AC
  Filled 2017-11-07: qty 2

## 2017-11-07 MED ORDER — EPHEDRINE SULFATE 50 MG/ML IJ SOLN
INTRAMUSCULAR | Status: AC
  Filled 2017-11-07: qty 1

## 2017-11-07 MED ORDER — FENTANYL CITRATE (PF) 250 MCG/5ML IJ SOLN
INTRAMUSCULAR | Status: AC
  Filled 2017-11-07: qty 5

## 2017-11-07 MED ORDER — ROPIVACAINE HCL 5 MG/ML IJ SOLN
INTRAMUSCULAR | Status: DC | PRN
  Administered 2017-11-07: 25 mL via PERINEURAL

## 2017-11-07 MED ORDER — ONDANSETRON HCL 4 MG/2ML IJ SOLN
INTRAMUSCULAR | Status: DC | PRN
  Administered 2017-11-07: 4 mg via INTRAVENOUS

## 2017-11-07 MED ORDER — FENTANYL CITRATE (PF) 100 MCG/2ML IJ SOLN: 25.0000 ug | INTRAMUSCULAR | Status: DC | PRN

## 2017-11-07 MED ORDER — DEXAMETHASONE SODIUM PHOSPHATE 10 MG/ML IJ SOLN
INTRAMUSCULAR | Status: AC
  Filled 2017-11-07: qty 1

## 2017-11-07 MED ORDER — 0.9 % SODIUM CHLORIDE (POUR BTL) OPTIME
TOPICAL | Status: DC | PRN
  Administered 2017-11-07: 1000 mL

## 2017-11-07 SURGICAL SUPPLY — 53 items
BANDAGE ACE 3X5.8 VEL STRL LF (GAUZE/BANDAGES/DRESSINGS) ×1 IMPLANT
BANDAGE ACE 4X5 VEL STRL LF (GAUZE/BANDAGES/DRESSINGS) ×1 IMPLANT
BANDAGE ELASTIC 4 VELCRO ST LF (GAUZE/BANDAGES/DRESSINGS) ×1 IMPLANT
BLADE LONG MED 31X9 (MISCELLANEOUS) ×1 IMPLANT
BNDG CMPR 9X4 STRL LF SNTH (GAUZE/BANDAGES/DRESSINGS) ×1
BNDG ESMARK 4X9 LF (GAUZE/BANDAGES/DRESSINGS) ×2 IMPLANT
BNDG GAUZE ELAST 4 BULKY (GAUZE/BANDAGES/DRESSINGS) ×3 IMPLANT
CORDS BIPOLAR (ELECTRODE) ×1 IMPLANT
COVER SURGICAL LIGHT HANDLE (MISCELLANEOUS) ×2 IMPLANT
DRAPE OEC MINIVIEW 54X84 (DRAPES) ×1 IMPLANT
DRAPE SURG 17X23 STRL (DRAPES) ×2 IMPLANT
DURAPREP 26ML APPLICATOR (WOUND CARE) ×2 IMPLANT
ELECT REM PT RETURN 9FT ADLT (ELECTROSURGICAL)
ELECTRODE REM PT RTRN 9FT ADLT (ELECTROSURGICAL) IMPLANT
GAUZE SPONGE 4X4 12PLY STRL (GAUZE/BANDAGES/DRESSINGS) ×1 IMPLANT
GAUZE SPONGE 4X4 12PLY STRL LF (GAUZE/BANDAGES/DRESSINGS) ×1 IMPLANT
GAUZE XEROFORM 1X8 LF (GAUZE/BANDAGES/DRESSINGS) ×2 IMPLANT
GLOVE SURG SS PI 7.0 STRL IVOR (GLOVE) ×1 IMPLANT
GLOVE SURG SYN 8.0 (GLOVE) ×2 IMPLANT
GLOVE SURG SYN 8.0 PF PI (GLOVE) ×1 IMPLANT
GOWN STRL REUS W/ TWL LRG LVL3 (GOWN DISPOSABLE) ×1 IMPLANT
GOWN STRL REUS W/ TWL XL LVL3 (GOWN DISPOSABLE) ×1 IMPLANT
GOWN STRL REUS W/TWL LRG LVL3 (GOWN DISPOSABLE) ×2
GOWN STRL REUS W/TWL XL LVL3 (GOWN DISPOSABLE) ×2
KIT BASIN OR (CUSTOM PROCEDURE TRAY) ×2 IMPLANT
KIT TURNOVER KIT B (KITS) ×2 IMPLANT
MANIFOLD NEPTUNE II (INSTRUMENTS) ×1 IMPLANT
NDL HYPO 25GX1X1/2 BEV (NEEDLE) IMPLANT
NEEDLE 22X1 1/2 (OR ONLY) (NEEDLE) IMPLANT
NEEDLE HYPO 25GX1X1/2 BEV (NEEDLE) IMPLANT
NS IRRIG 1000ML POUR BTL (IV SOLUTION) ×2 IMPLANT
PACK ORTHO EXTREMITY (CUSTOM PROCEDURE TRAY) ×2 IMPLANT
PAD ARMBOARD 7.5X6 YLW CONV (MISCELLANEOUS) ×4 IMPLANT
PAD CAST 3X4 CTTN HI CHSV (CAST SUPPLIES) ×1 IMPLANT
PAD CAST 4YDX4 CTTN HI CHSV (CAST SUPPLIES) ×1 IMPLANT
PADDING CAST COTTON 3X4 STRL (CAST SUPPLIES) ×2
PADDING CAST COTTON 4X4 STRL (CAST SUPPLIES) ×2
PENCIL BUTTON HOLSTER BLD 10FT (ELECTRODE) IMPLANT
SPONGE LAP 4X18 RFD (DISPOSABLE) ×3 IMPLANT
STRIP CLOSURE SKIN 1/2X4 (GAUZE/BANDAGES/DRESSINGS) IMPLANT
SUCTION FRAZIER HANDLE 10FR (MISCELLANEOUS) ×1
SUCTION TUBE FRAZIER 10FR DISP (MISCELLANEOUS) IMPLANT
SUT ETHILON 4 0 PS 2 18 (SUTURE) ×1 IMPLANT
SUT FIBERWIRE 2-0 18 17.9 3/8 (SUTURE) ×2
SUT PROLENE 3 0 PS 2 (SUTURE) IMPLANT
SUT VIC AB 4-0 PS2 27 (SUTURE) ×1 IMPLANT
SUTURE FIBERWR 2-0 18 17.9 3/8 (SUTURE) IMPLANT
SYR CONTROL 10ML LL (SYRINGE) ×1 IMPLANT
TOWEL OR 17X24 6PK STRL BLUE (TOWEL DISPOSABLE) ×1 IMPLANT
TOWEL OR 17X26 10 PK STRL BLUE (TOWEL DISPOSABLE) ×2 IMPLANT
TUBE CONNECTING 12X1/4 (SUCTIONS) IMPLANT
UNDERPAD 30X30 (UNDERPADS AND DIAPERS) ×2 IMPLANT
WATER STERILE IRR 1000ML POUR (IV SOLUTION) ×1 IMPLANT

## 2017-11-07 NOTE — Anesthesia Procedure Notes (Signed)
Procedure Name: LMA Insertion Date/Time: 11/07/2017 1:57 PM Performed by: Inda Coke, CRNA Pre-anesthesia Checklist: Patient identified, Emergency Drugs available, Suction available and Patient being monitored Patient Re-evaluated:Patient Re-evaluated prior to induction Oxygen Delivery Method: Circle System Utilized Preoxygenation: Pre-oxygenation with 100% oxygen Induction Type: IV induction Ventilation: Mask ventilation without difficulty LMA: LMA inserted LMA Size: 4.0 Number of attempts: 1 Airway Equipment and Method: Bite block Placement Confirmation: positive ETCO2 Tube secured with: Tape Dental Injury: Teeth and Oropharynx as per pre-operative assessment

## 2017-11-07 NOTE — Anesthesia Preprocedure Evaluation (Signed)
Anesthesia Evaluation  Patient identified by MRN, date of birth, ID band Patient awake    Reviewed: Allergy & Precautions, NPO status , Patient's Chart, lab work & pertinent test results  Airway Mallampati: II  TM Distance: >3 FB Neck ROM: Full    Dental no notable dental hx.    Pulmonary COPD, Current Smoker,    Pulmonary exam normal breath sounds clear to auscultation       Cardiovascular hypertension, Pt. on medications and Pt. on home beta blockers + CAD, + Past MI and + Cardiac Stents  Normal cardiovascular exam Rhythm:Regular Rate:Normal     Neuro/Psych negative neurological ROS  negative psych ROS   GI/Hepatic negative GI ROS, Neg liver ROS,   Endo/Other  negative endocrine ROS  Renal/GU negative Renal ROS  negative genitourinary   Musculoskeletal negative musculoskeletal ROS (+)   Abdominal   Peds negative pediatric ROS (+)  Hematology negative hematology ROS (+)   Anesthesia Other Findings   Reproductive/Obstetrics negative OB ROS                             Anesthesia Physical Anesthesia Plan  ASA: III  Anesthesia Plan: General   Post-op Pain Management:    Induction: Intravenous  PONV Risk Score and Plan: 2 and Ondansetron, Dexamethasone and Treatment may vary due to age or medical condition  Airway Management Planned: LMA  Additional Equipment:   Intra-op Plan:   Post-operative Plan:   Informed Consent: I have reviewed the patients History and Physical, chart, labs and discussed the procedure including the risks, benefits and alternatives for the proposed anesthesia with the patient or authorized representative who has indicated his/her understanding and acceptance.   Dental advisory given  Plan Discussed with: CRNA and Surgeon  Anesthesia Plan Comments:         Anesthesia Quick Evaluation

## 2017-11-07 NOTE — Anesthesia Procedure Notes (Signed)
Anesthesia Procedure Image    

## 2017-11-07 NOTE — Op Note (Signed)
NAME: Sara Fox, Sara Fox MEDICAL RECORD [id number] ACCOUNT [id number] DATE OF BIRTH:Sep 29, 1940 FACILITY: [facility] LOCATION: [facility] PHYSICIAN:Brad Mcgaughy A. Burney Gauze, MD  OPERATIVE REPORT  DATE OF PROCEDURE:  11/07/2017  PREOPERATIVE DIAGNOSIS:  Left wrist retained painful hardware and distal ulnar impaction syndrome.  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  PROCEDURE:  Hardware removal volar aspect of left wrist and distal ulnar resection with extensor carpi ulnaris stabilization.  SURGEON:  Charlotte Crumb, MD  ASSISTANT:  None.  ANESTHESIA:  Regional and general anesthetic.  COMPLICATIONS:  None.  DRAINS:  None.  SPECIMENS:  None.  DESCRIPTION OF PROCEDURE:  The patient was taken to the operating suite and after the induction of adequate axillary block analgesia and then general laryngeal mask airway anesthetic.  The left upper extremity was prepped and draped in the usual sterile  fashion.  An Esmarch was used to exsanguinate the limb and the tourniquet was then inflated to 250 mmHg.  At this point in time, incision was made on the palmar aspect of the left distal forearm and wrist area from her previous volar plating.  Skin was  incised sharply 5 cm from the wrist crease.  Dissection was carried down to a prominent volar screw that was removed with no undue difficulty.  The wound was irrigated and loosely closed with 4-0 nylon.  A second incision was made over the distal ulna in  the dorsal aspect of the left wrist.  Skin was incised sharply.  Dissection was carried down to the extensor retinaculum.  We opened the extensor retinaculum on the sixth dorsal compartment and carefully retracted the extensor carpi ulnaris tendon to  the ulnar side.  We performed a midline arthrotomy of the distal radial ulnar joint and subperiosteally dissected out the distal ulna.  Using a sagittal saw, we resected the distal 1.5-2 cm of the  ulna with care taken to maintain the soft tissue  attachments distally.  We thoroughly irrigated.  We loosely closed the capsule with a 2-0 FiberWire suture.  We then created a distally based strip of the extensor carpi ulnaris tendon and used it to stabilize the distal end of the ulna suturing it with  the same 2-0 FiberWire suture.  We then closed the retinaculum and the sixth dorsal compartment with 4-0 Vicryl in a running locking epitendinous type stitch.  We then closed the skin with 4-0 nylon.  Xeroform, 4 x 4's, fluffs and an ulnar gutter splint  was applied.  The patient tolerated these procedures well and went to recovery room in stable fashion.  TN/NUANCE  D:11/07/2017 T:11/07/2017 JOB:[id number]

## 2017-11-07 NOTE — H&P (Signed)
Sara Fox is an 77 y.o. female.   Chief Complaint: chronic left wrist pain EGB:[id number] a very pleasant 77 year old female right hand dominant status post operative fixation of a distal radius fracture on the left side which resulted in a malunion and ulnar positive variance and loosening of a volar screw.  Past Medical History:  Diagnosis Date  . Acute kidney injury ([place])   . Back pain   . Cervical cancer ([place])   . CHF (congestive heart failure) ([place])   . COPD (chronic obstructive pulmonary disease) ([place])   . Coronary artery disease    s/p DES x2 to RCA 2016  . GERD (gastroesophageal reflux disease)   . Hyperlipidemia   . Hypertension   . Myocardial infarction ([place])   . Neck pain   . Rhabdomyolysis   . Stomach problems     Past Surgical History:  Procedure Laterality Date  . ABDOMINAL HYSTERECTOMY    . BACK SURGERY  2012   lower back  . CARDIAC CATHETERIZATION  06/1999   noncritical disease invovling PDA  . CARDIAC CATHETERIZATION N/A 09/14/2014   Procedure: Left Heart Cath and Coronary Angiography;  Surgeon: Leonie Man, MD;  Location: [facility] INVASIVE CV LAB CUPID;  Service: Cardiovascular;  Laterality: N/A;  . CHOLECYSTECTOMY    . ESOPHAGOGASTRODUODENOSCOPY (EGD) WITH PROPOFOL Left 07/19/2017   Procedure: ESOPHAGOGASTRODUODENOSCOPY (EGD) WITH PROPOFOL;  Surgeon: Ronnette Juniper, MD;  Location: [facility] END[facility];  Service: Gastroenterology;  Laterality: Left;  . FRACTURE SURGERY Left 05/2017   left wrist  . KNEE SURGERY Bilateral 2001 & 2007  . NECK SURGERY  2012   2012  . PARTIAL GASTRECTOMY  2005   subtotal  . PERCUTANEOUS CORONARY STENT INTERVENTION (PCI-S)  09/14/2014   Procedure: Percutaneous Coronary Stent Intervention (Pci-S);  Surgeon: Leonie Man, MD;  Location: [facility] INVASIVE CV LAB CUPID;  Service: Cardiovascular;;  . SHOULDER SURGERY Right   . TRANSTHORACIC ECHOCARDIOGRAM  06/02/2010   EF=>55%, normal LV systolic function; normal RV systolic function; mild mitral  annular calcif; trace TR; AV mildly sclerotic    Family History  Problem Relation Age of Onset  . Heart disease Mother        also HTN  . Diabetes Mother   . Colon cancer Mother   . Heart disease Brother        deceased at 74  . Hypertension Brother   . Heart attack Brother   . Heart disease Brother   . Hypertension Brother    Social History:  reports that she has been smoking.  She has a 60.00 pack-year smoking history. She has never used smokeless tobacco. She reports that she does not drink alcohol or use drugs.  Allergies:  Allergies  Allergen Reactions  . Buprenorphine Hcl Shortness Of Breath    Throat swelling/trouble breathing and lethargic  . Morphine And Related Shortness Of Breath    Throat swelling/trouble breathing and lethargic  . Celebrex [Celecoxib] Diarrhea and Swelling    Swelling of legs   . Vioxx [Rofecoxib] Palpitations  . Codeine Other (See Comments)    Bloated     Medications Prior to Admission  Medication Sig Dispense Refill  . budesonide-formoterol (SYMBICORT) 160-4.5 MCG/ACT inhaler Inhale 2 puffs into the lungs 2 (two) times daily.    Marland Kitchen CALCIUM CITRATE PO Take 1 tablet by mouth daily.     . Cholecalciferol (VITAMIN D3) 2000 UNITS capsule Take 2,000 Units by mouth daily.    . furosemide (LASIX) 20 MG tablet Take 20  mg by mouth every other day.  1  . gabapentin (NEURONTIN) 100 MG capsule Take 1 capsule (100 mg total) by mouth at bedtime. 30 capsule 1  . metoprolol tartrate (LOPRESSOR) 25 MG tablet Take 1 tablet (25 mg total) by mouth 2 (two) times daily with a meal. 180 tablet 1  . Multiple Vitamin (MULTIVITAMIN) capsule Take 1 capsule by mouth daily. Contains iron    . nitroGLYCERIN (NITROSTAT) 0.4 MG SL tablet Place 0.4 mg under the tongue every 5 (five) minutes as needed for chest pain.    Marland Kitchen oxybutynin (DITROPAN) 5 MG tablet Take 2.5 mg by mouth 2 (two) times daily.    . pantoprazole (PROTONIX) 40 MG tablet Take 1 tablet (40 mg total) by mouth 2  (two) times daily with a meal. 90 tablet 3  . polyethylene glycol (MIRALAX / GLYCOLAX) packet Take 17 g by mouth daily as needed for mild constipation.     . Potassium Chloride ER 20 MEQ TBCR Take 40 mEq by mouth every morning.  1  . traMADol (ULTRAM) 50 MG tablet TAKE 1 TO 2 TABLETS BY MOUTH EVERY 6 HOURS AS NEEDED (Patient taking differently: Take two tablets (100mg ) twice daily for pain.) 180 tablet 5  . vitamin C (ASCORBIC ACID) 500 MG tablet Take 500 mg by mouth daily.    Marland Kitchen BRILINTA 90 MG TABS tablet Take 90 mg by mouth 2 (two) times daily.  2  . Iron Combinations (NIFEREX) TABS Take 150 mg by mouth daily. (Patient not taking: Reported on 11/07/2017)      No results found for this or any previous visit (from the past 48 hour(s)). No results found.  Review of Systems  All other systems reviewed and are negative.   Blood pressure (!) 151/73, pulse 76, temperature 98.3 F (36.8 C), temperature source Oral, resp. rate 18, height 5\' 4"  (1.626 m), weight 59.9 kg (132 lb), SpO2 100 %. Physical Exam  Constitutional: She is oriented to person, place, and time. She appears well-developed and well-nourished.  HENT:  Head: Normocephalic and atraumatic.  Neck: Normal range of motion.  Cardiovascular: Normal rate.  Respiratory: Effort normal.  Musculoskeletal:       Left wrist: She exhibits tenderness, bony tenderness and swelling.  Left wrist ulnar-sided pain with prominent volar hardware palpable subcutaneously  Neurological: She is alert and oriented to person, place, and time.  Skin: Skin is warm.  Psychiatric: She has a normal mood and affect. Her behavior is normal. Judgment and thought content normal.     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 I discussed with the family and the patient the need for hardware removal and distal ulnar resection with ECU stabilization as an outpatient. They understand the risks and benefits and  wished to proceed. Schuyler Amor, MD 11/07/2017, 12:56 PM

## 2017-11-07 NOTE — Progress Notes (Signed)
Pt states she took nitroglycerin 2 days ago for chest pain. This is new chest pain to patient, and did not feel like the chest pain she experienced prior to having stents placed. Pt describes pain as "a fuzzy bear in my chest moving around" and pointed to the center of her chest. Pt has not experienced this pain again, and it was relieved by 1 nitroglycerin.   Dr. Kalman Shan notified and EKG performed per order.

## 2017-11-07 NOTE — Transfer of Care (Signed)
Immediate Anesthesia Transfer of Care Note  Patient: Sara Fox  Procedure(s) Performed: LEFT WRIST HARDWARE REMOVAL (Left Wrist) LEFT WRIST DISTAL ULNA RESECTION WITH EXTENSOR CARPI ULNARIS STABILIZATION (Left Wrist)  Patient Location: PACU  Anesthesia Type:GA combined with regional for post-op pain  Level of Consciousness: awake and drowsy  Airway & Oxygen Therapy: Patient Spontanous Breathing and Patient connected to nasal cannula oxygen  Post-op Assessment: Report given to RN and Post -op Vital signs reviewed and stable  Post vital signs: Reviewed and stable  Last Vitals:  Vitals Value Taken Time  BP 144/73 11/07/2017  2:54 PM  Temp    Pulse 68 11/07/2017  2:58 PM  Resp 18 11/07/2017  2:58 PM  SpO2 100 % 11/07/2017  2:58 PM  Vitals shown include unvalidated device data.  Last Pain:  Vitals:   11/07/17 1455  TempSrc:   PainSc: Asleep      Patients Stated Pain Goal: 0 (56/81/27 5170)  Complications: No apparent anesthesia complications

## 2017-11-07 NOTE — Op Note (Signed)
Please see the dictated report [id number]

## 2017-11-07 NOTE — Anesthesia Procedure Notes (Signed)
Anesthesia Regional Block: Supraclavicular block   Pre-Anesthetic Checklist: ,, timeout performed, Correct Patient, Correct Site, Correct Laterality, Correct Procedure, Correct Position, site marked, Risks and benefits discussed,  Surgical consent,  Pre-op evaluation,  At surgeon's request and post-op pain management  Laterality: Left  Prep: chloraprep       Needles:  Injection technique: Single-shot  Needle Type: Echogenic Needle     Needle Length: 9cm      Additional Needles:   Procedures:,,,, ultrasound used (permanent image in chart),,,,  Narrative:  Start time: 11/07/2017 1:10 PM End time: 11/07/2017 1:18 PM Injection made incrementally with aspirations every 5 mL.  Performed by: Personally  Anesthesiologist: Myrtie Soman, MD  Additional Notes: Patient tolerated the procedure well without complications

## 2017-11-08 ENCOUNTER — Encounter (HOSPITAL_COMMUNITY): Payer: Self-pay | Admitting: Orthopedic Surgery

## 2017-11-08 NOTE — Anesthesia Postprocedure Evaluation (Signed)
Anesthesia Post Note  Patient: Sara Fox  Procedure(s) Performed: LEFT WRIST HARDWARE REMOVAL (Left Wrist) LEFT WRIST DISTAL ULNA RESECTION WITH EXTENSOR CARPI ULNARIS STABILIZATION (Left Wrist)     Patient location during evaluation: PACU Anesthesia Type: General and Regional Level of consciousness: awake and alert Pain management: pain level controlled Vital Signs Assessment: post-procedure vital signs reviewed and stable Respiratory status: spontaneous breathing, nonlabored ventilation, respiratory function stable and patient connected to nasal cannula oxygen Cardiovascular status: blood pressure returned to baseline and stable Postop Assessment: no apparent nausea or vomiting Anesthetic complications: no    Last Vitals:  Vitals:   11/07/17 1600 11/07/17 1615  BP: (!) 141/74 131/77  Pulse: 65 64  Resp: 16 17  Temp:  36.4 C  SpO2: 99% 100%    Last Pain:  Vitals:   11/07/17 1615  TempSrc:   PainSc: 0-No pain                 Lazarius Rivkin

## 2017-12-04 ENCOUNTER — Telehealth: Payer: Self-pay | Admitting: Internal Medicine

## 2017-12-04 ENCOUNTER — Encounter: Payer: Self-pay | Admitting: Internal Medicine

## 2017-12-04 ENCOUNTER — Ambulatory Visit: Payer: Medicare Other | Admitting: Internal Medicine

## 2017-12-04 VITALS — BP 112/64 | HR 74 | Ht 64.0 in | Wt 141.0 lb

## 2017-12-04 DIAGNOSIS — R079 Chest pain, unspecified: Secondary | ICD-10-CM

## 2017-12-04 DIAGNOSIS — I2510 Atherosclerotic heart disease of native coronary artery without angina pectoris: Secondary | ICD-10-CM | POA: Diagnosis not present

## 2017-12-04 MED ORDER — NITROGLYCERIN 0.4 MG SL SUBL: 0.4000 mg | SUBLINGUAL_TABLET | SUBLINGUAL | 3 refills | Status: DC | PRN

## 2017-12-04 NOTE — Progress Notes (Signed)
OFFICE NOTE  Chief Complaint:  Chest pain  Primary Care Physician: [name] Chard, MD  HPI:  Sara Fox is a 77 year old female with history of multiple surgeries in the past including knee replacement, shoulder surgery, subtotal gastrectomy, and recently she underwent back surgery. She has actually done very well with all these surgeries; has had no cardiac complications, MIs, or any other significant issues. She did have an echocardiogram which showed an EF of 55% recently with mild diastolic dysfunction. There are family risk factors for coronary disease, but she is on appropriate medications including aspirin and a statin. Recently she underwent a Corus gene test in your office which was abnormal, demonstrating a score of 20, which indicates a 30% likelihood of obstructive coronary disease. She remains asymptomatic from a cardiac standpoint and I did not feel additional work-up was necessary.  Her main concern today is caring for her husband who has been pretty sick. She reports her blood pressures been under good control. She is currently taking lovastatin and her cholesterol is at goal.  She does report a trace amount of lower extremity edema from time to time.  I saw Sara Fox back today. On fortunately she presented to the hospital with chest pain and was found to have non-STEMI. She underwent cardiac catheterization by Dr. Ellyn Hack in had a severe mid to distal RCA stenosis. She had a placement of a long 33 mm drug-eluting stent which was designed to [place]. She tolerated this well his had no further chest pain. She is currently on aspirin and Brilinta. She reports taking her medications regularly. She does feel that she gets a little bit dry mouth and may be a little dehydrated. She is on Lasix 20 mg daily but was found to have a mildly reduced EF about 45%.  04/12/2016  Sara Fox returns today for follow-up. Overall she is feeling very well. She denies any chest pain or worsening  shortness of breath. She's had about 11 pound weight loss and has been working with physical medicine and rehabilitation for ongoing treatment of chronic pain. She denies any worsening chest pain or shortness of breath. Her stent was placed last more than one year ago. She has been on aspirin and Brilinta. I decreased her Lasix at her last office visit and increased her lisinopril. Blood pressure is fairly well-controlled.  12/04/2017  Sara Fox returns today for follow-up.  She reports 2 episodes of chest pain last month.  In June she underwent removal of some hardware from her left wrist.  She had been doing well but then developed left-sided chest discomfort.  Interestingly she still on Brilinta after PCI to the right coronary artery in 2016.  Apparently according to her son, she developed swelling with coming off of Brilinta and was restarted on the swelling disappeared.  I am never heard of this and I am concerned about long-term use of Brilinta regarding bleeding risk.  With regards to the chest pain the 2 episodes were slightly different.  The first episode was at rest and relieved after 2 nitroglycerin.  Second episode was associated with some indigestion and improved after belching.  She is noted to have residual 50% LAD stenosis on cath in 2016.  PMHx:  Past Medical History:  Diagnosis Date  . Acute kidney injury ([place])   . Back pain   . Cervical cancer ([place])   . CHF (congestive heart failure) ([place])   . COPD (chronic obstructive pulmonary disease) ([place])   . Coronary  artery disease    s/p DES x2 to RCA 2016  . GERD (gastroesophageal reflux disease)   . Hyperlipidemia   . Hypertension   . Myocardial infarction ([place])   . Neck pain   . Rhabdomyolysis   . Stomach problems     Past Surgical History:  Procedure Laterality Date  . ABDOMINAL HYSTERECTOMY    . BACK SURGERY  2012   lower back  . CARDIAC CATHETERIZATION  06/1999   noncritical disease invovling PDA  . CARDIAC  CATHETERIZATION N/A 09/14/2014   Procedure: Left Heart Cath and Coronary Angiography;  Surgeon: Leonie Man, MD;  Location: [facility] INVASIVE CV LAB CUPID;  Service: Cardiovascular;  Laterality: N/A;  . CHOLECYSTECTOMY    . ESOPHAGOGASTRODUODENOSCOPY (EGD) WITH PROPOFOL Left 07/19/2017   Procedure: ESOPHAGOGASTRODUODENOSCOPY (EGD) WITH PROPOFOL;  Surgeon: Ronnette Juniper, MD;  Location: [facility];  Service: Gastroenterology;  Laterality: Left;  . FRACTURE SURGERY Left 05/2017   left wrist  . HARDWARE REMOVAL Left 11/07/2017   Procedure: LEFT WRIST HARDWARE REMOVAL;  Surgeon: Charlotte Crumb, MD;  Location: [place];  Service: Orthopedics;  Laterality: Left;  . KNEE SURGERY Bilateral 2001 & 2007  . NECK SURGERY  2012   2012  . PARTIAL GASTRECTOMY  2005   subtotal  . PERCUTANEOUS CORONARY STENT INTERVENTION (PCI-S)  09/14/2014   Procedure: Percutaneous Coronary Stent Intervention (Pci-S);  Surgeon: Leonie Man, MD;  Location: [facility] INVASIVE CV LAB CUPID;  Service: Cardiovascular;;  . SHOULDER SURGERY Right   . TRANSTHORACIC ECHOCARDIOGRAM  06/02/2010   EF=>55%, normal LV systolic function; normal RV systolic function; mild mitral annular calcif; trace TR; AV mildly sclerotic  . WRIST OSTEOTOMY Left 11/07/2017   Procedure: LEFT WRIST DISTAL ULNA RESECTION WITH EXTENSOR CARPI ULNARIS STABILIZATION;  Surgeon: Charlotte Crumb, MD;  Location: [place];  Service: Orthopedics;  Laterality: Left;    FAMHx:  Family History  Problem Relation Age of Onset  . Heart disease Mother        also HTN  . Diabetes Mother   . Colon cancer Mother   . Heart disease Brother        deceased at 32  . Hypertension Brother   . Heart attack Brother   . Heart disease Brother   . Hypertension Brother     SOCHx:   reports that she has been smoking.  She has a 60.00 pack-year smoking history. She has never used smokeless tobacco. She reports that she does not drink alcohol or use drugs.  ALLERGIES:  Allergies  Allergen  Reactions  . Buprenorphine Hcl Shortness Of Breath    Throat swelling/trouble breathing and lethargic  . Morphine And Related Shortness Of Breath    Throat swelling/trouble breathing and lethargic  . Celebrex [Celecoxib] Diarrhea and Swelling    Swelling of legs   . Vioxx [Rofecoxib] Palpitations  . Codeine Other (See Comments)    Bloated     ROS: Pertinent items noted in HPI and remainder of comprehensive ROS otherwise negative.  HOME MEDS: Current Outpatient Medications  Medication Sig Dispense Refill  . BRILINTA 90 MG TABS tablet Take 90 mg by mouth 2 (two) times daily.  2  . budesonide-formoterol (SYMBICORT) 160-4.5 MCG/ACT inhaler Inhale 2 puffs into the lungs 2 (two) times daily.    Marland Kitchen CALCIUM CITRATE PO Take 1 tablet by mouth daily.     . Cholecalciferol (VITAMIN D3) 2000 UNITS capsule Take 2,000 Units by mouth daily.    . furosemide (LASIX) 20 MG tablet Take  20 mg by mouth every other day.  1  . gabapentin (NEURONTIN) 100 MG capsule Take 1 capsule (100 mg total) by mouth at bedtime. 30 capsule 1  . metoprolol tartrate (LOPRESSOR) 25 MG tablet Take 1 tablet (25 mg total) by mouth 2 (two) times daily with a meal. 180 tablet 1  . Multiple Vitamin (MULTIVITAMIN) capsule Take 1 capsule by mouth daily. Contains iron    . nitroGLYCERIN (NITROSTAT) 0.4 MG SL tablet Place 0.4 mg under the tongue every 5 (five) minutes as needed for chest pain.    Marland Kitchen oxybutynin (DITROPAN) 5 MG tablet Take 2.5 mg by mouth 2 (two) times daily.    . pantoprazole (PROTONIX) 40 MG tablet Take 1 tablet (40 mg total) by mouth 2 (two) times daily with a meal. 90 tablet 3  . polyethylene glycol (MIRALAX / GLYCOLAX) packet Take 17 g by mouth daily as needed for mild constipation.     . Potassium Chloride ER 20 MEQ TBCR Take 40 mEq by mouth every morning.  1  . traMADol (ULTRAM) 50 MG tablet TAKE 1 TO 2 TABLETS BY MOUTH EVERY 6 HOURS AS NEEDED (Patient taking differently: Take two tablets (100mg ) twice daily for  pain.) 180 tablet 5  . vitamin C (ASCORBIC ACID) 500 MG tablet Take 500 mg by mouth daily.    Marland Kitchen oxyCODONE-acetaminophen (PERCOCET/ROXICET) 5-325 MG tablet Take 1 tablet by mouth every 6 (six) hours as needed. for pain  0  . rosuvastatin (CRESTOR) 40 MG tablet Take 40 mg by mouth daily.  1   No current facility-administered medications for this visit.     LABS/IMAGING: No results found for this or any previous visit (from the past 48 hour(s)). No results found.  VITALS: BP 112/64   Pulse 74   Ht 5\' 4"  (1.626 m)   Wt 141 lb (64 kg)   BMI 24.20 kg/m   EXAM: General appearance: alert and no distress Neck: no JVD Lungs: clear to auscultation bilaterally Heart: regular rate and rhythm, S1, S2 normal and systolic murmur: early systolic 2/6, crescendo at 2nd right intercostal space Abdomen: soft, non-tender; bowel sounds normal; no masses,  no organomegaly Extremities: edema trace bilateral Pulses: 2+ and symmetric Skin: Skin color, texture, turgor normal. No rashes or lesions Neurologic: Grossly normal Psych: Seems somewhat pre-occupied  EKG: Normal sinus rhythm at 74-personally reviewed  ASSESSMENT: 1. Chest pain, possible unstable angina 2. Coronary artery disease status post PCI to the RCA with a 3.0 x 33 mm Xience Alpine drug-eluting stent (09/2014) 3. Hypertension-controlled 4. Dyslipidemia -followed by PCP.  PLAN: 1.   Sara Fox had 2 separate episodes of chest pain.  One sounds like reflux/gas, but the other may be unstable angina.  She has not had coronary assessment of her residual LAD disease and RCA after stenting in 2016.  I recommend a Lexiscan Myoview to further assess this.  If negative then we should consider de-escalating or antiplatelet therapy.  I would recommend maintenance low-dose aspirin rather than Brilinta at this point.  Follow-up with me afterwards.  Pixie Casino, MD, [facility], [place] Director of the Advanced  Lipid Disorders &  Cardiovascular Risk Reduction Clinic Diplomate of the American Board of Clinical Lipidology Attending Cardiologist  Direct Dial: [phone]  Fax: [phone]  Website:  [email or web address] [place] 12/04/2017, 11:08 AM

## 2017-12-04 NOTE — Telephone Encounter (Signed)
Spoke with patient and confirmed she was talking about nuclear myoview. Advised to proceed with test as scheduled and would call with results.

## 2017-12-04 NOTE — Patient Instructions (Signed)
Dr. Debara Pickett has ordered a Lexiscan Myocardial Perfusion Imaging Study.  Please arrive 15 minutes prior to your appointment time for registration and insurance purposes.   The test will take approximately 3 to 4 hours to complete; you may bring reading material.  If someone comes with you to your appointment, they will need to remain in the main lobby due to limited space in the testing area. **If you are pregnant or breastfeeding, please notify the nuclear lab prior to your appointment**   How to prepare for your Myocardial Perfusion Test:  Do not eat or drink 3 hours prior to your test, except you may have water.  Do not consume products containing caffeine (regular or decaffeinated) 12 hours prior to your test. (ex: coffee, chocolate, sodas, tea).  Do wear comfortable clothes (no dresses or overalls) and walking shoes, tennis shoes preferred (No heels or open toe shoes are allowed).  Do NOT wear cologne, perfume, aftershave, or lotions (deodorant is allowed).  If you use an inhaler, use it the AM of your test and bring it with you.   If you use a nebulizer, use it the AM of your test.   If these instructions are not followed, your test will have to be rescheduled.  Your physician recommends that you schedule a follow-up appointment in: 4-6 weeks with Dr. Debara Pickett.

## 2017-12-04 NOTE — Telephone Encounter (Signed)
New Message   Pt states that she is suppose to have a procedure and the nurse told her to call her insurance to make she they will pay and the ins states the procedure is billable. Please call

## 2017-12-07 ENCOUNTER — Encounter: Payer: Medicare Other | Attending: Physical Medicine & Rehabilitation

## 2017-12-07 ENCOUNTER — Encounter: Payer: Self-pay | Admitting: Physical Medicine & Rehabilitation

## 2017-12-07 ENCOUNTER — Telehealth (HOSPITAL_COMMUNITY): Payer: Self-pay

## 2017-12-07 ENCOUNTER — Ambulatory Visit: Payer: Medicare Other | Admitting: Physical Medicine & Rehabilitation

## 2017-12-07 VITALS — BP 130/72 | HR 72 | Ht 64.0 in | Wt 141.0 lb

## 2017-12-07 DIAGNOSIS — Z9889 Other specified postprocedural states: Secondary | ICD-10-CM | POA: Diagnosis not present

## 2017-12-07 DIAGNOSIS — Z79899 Other long term (current) drug therapy: Secondary | ICD-10-CM | POA: Diagnosis not present

## 2017-12-07 DIAGNOSIS — Z90710 Acquired absence of both cervix and uterus: Secondary | ICD-10-CM | POA: Diagnosis not present

## 2017-12-07 DIAGNOSIS — Z8249 Family history of ischemic heart disease and other diseases of the circulatory system: Secondary | ICD-10-CM | POA: Diagnosis not present

## 2017-12-07 DIAGNOSIS — Z833 Family history of diabetes mellitus: Secondary | ICD-10-CM | POA: Insufficient documentation

## 2017-12-07 DIAGNOSIS — I2510 Atherosclerotic heart disease of native coronary artery without angina pectoris: Secondary | ICD-10-CM | POA: Diagnosis not present

## 2017-12-07 DIAGNOSIS — I509 Heart failure, unspecified: Secondary | ICD-10-CM | POA: Diagnosis not present

## 2017-12-07 DIAGNOSIS — M4328 Fusion of spine, sacral and sacrococcygeal region: Secondary | ICD-10-CM | POA: Diagnosis not present

## 2017-12-07 DIAGNOSIS — M4322 Fusion of spine, cervical region: Secondary | ICD-10-CM | POA: Insufficient documentation

## 2017-12-07 DIAGNOSIS — Z96611 Presence of right artificial shoulder joint: Secondary | ICD-10-CM | POA: Insufficient documentation

## 2017-12-07 DIAGNOSIS — Z9049 Acquired absence of other specified parts of digestive tract: Secondary | ICD-10-CM | POA: Diagnosis not present

## 2017-12-07 DIAGNOSIS — K219 Gastro-esophageal reflux disease without esophagitis: Secondary | ICD-10-CM | POA: Insufficient documentation

## 2017-12-07 DIAGNOSIS — I10 Essential (primary) hypertension: Secondary | ICD-10-CM | POA: Diagnosis not present

## 2017-12-07 DIAGNOSIS — J449 Chronic obstructive pulmonary disease, unspecified: Secondary | ICD-10-CM | POA: Insufficient documentation

## 2017-12-07 DIAGNOSIS — Z800 Family history of malignant neoplasm of digestive organs: Secondary | ICD-10-CM | POA: Diagnosis not present

## 2017-12-07 DIAGNOSIS — M961 Postlaminectomy syndrome, not elsewhere classified: Secondary | ICD-10-CM | POA: Insufficient documentation

## 2017-12-07 DIAGNOSIS — I252 Old myocardial infarction: Secondary | ICD-10-CM | POA: Insufficient documentation

## 2017-12-07 DIAGNOSIS — F17210 Nicotine dependence, cigarettes, uncomplicated: Secondary | ICD-10-CM | POA: Insufficient documentation

## 2017-12-07 DIAGNOSIS — Z9181 History of falling: Secondary | ICD-10-CM | POA: Insufficient documentation

## 2017-12-07 DIAGNOSIS — Z5181 Encounter for therapeutic drug level monitoring: Secondary | ICD-10-CM
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
## 2017-12-07 NOTE — Telephone Encounter (Signed)
Encounter complete. 

## 2017-12-12 ENCOUNTER — Ambulatory Visit (HOSPITAL_COMMUNITY)
Admission: RE | Admit: 2017-12-12 | Discharge: 2017-12-12 | Disposition: A | Discharge status: Home / Self Care | Payer: Medicare Other | Source: Ambulatory Visit | Attending: Cardiovascular Disease | Admitting: Cardiovascular Disease

## 2017-12-12 DIAGNOSIS — R079 Chest pain, unspecified: Secondary | ICD-10-CM | POA: Insufficient documentation

## 2017-12-12 DIAGNOSIS — I2510 Atherosclerotic heart disease of native coronary artery without angina pectoris: Secondary | ICD-10-CM | POA: Insufficient documentation

## 2017-12-12 LAB — MYOCARDIAL PERFUSION IMAGING
LV dias vol: 121 mL (ref 46–106)
LV sys vol: 64 mL
Peak HR: 88 {beats}/min
Rest HR: 75 {beats}/min
SDS: 0
SRS: 0
SSS: 0
TID: 0.99

## 2017-12-12 MED ORDER — TECHNETIUM TC 99M TETROFOSMIN IV KIT
9.3000 | PACK | Freq: Once | INTRAVENOUS | Status: AC | PRN
  Administered 2017-12-12: 9.3 via INTRAVENOUS
  Filled 2017-12-12: qty 10

## 2017-12-12 MED ORDER — TECHNETIUM TC 99M TETROFOSMIN IV KIT
31.5000 | PACK | Freq: Once | INTRAVENOUS | Status: AC | PRN
  Administered 2017-12-12: 31.5 via INTRAVENOUS
  Filled 2017-12-12: qty 32

## 2017-12-12 MED ORDER — AMINOPHYLLINE 25 MG/ML IV SOLN
75.0000 mg | Freq: Once | INTRAVENOUS | Status: AC
  Administered 2017-12-12: 75 mg via INTRAVENOUS

## 2017-12-12 MED ORDER — REGADENOSON 0.4 MG/5ML IV SOLN
0.4000 mg | Freq: Once | INTRAVENOUS | Status: AC
  Administered 2017-12-12: 0.4 mg via INTRAVENOUS

## 2017-12-13 LAB — TOXASSURE SELECT,+ANTIDEPR,UR

## 2017-12-14 ENCOUNTER — Telehealth: Payer: Self-pay | Admitting: *Deleted

## 2017-12-14 NOTE — Telephone Encounter (Signed)
Urine drug screen for this encounter is consistent for prescribed medication 

## 2017-12-20 ENCOUNTER — Telehealth: Payer: Self-pay | Admitting: Internal Medicine

## 2017-12-20 NOTE — Telephone Encounter (Signed)
Called patient with stress test results. She is aware to STOP brilinta and start aspirin 81mg  daily

## 2018-01-28 ENCOUNTER — Ambulatory Visit: Payer: Medicare Other | Admitting: Internal Medicine

## 2018-01-28 DIAGNOSIS — R0989 Other specified symptoms and signs involving the circulatory and respiratory systems: Secondary | ICD-10-CM

## 2018-01-29 ENCOUNTER — Encounter: Payer: Self-pay | Admitting: *Deleted

## 2018-02-16 ENCOUNTER — Other Ambulatory Visit: Payer: Self-pay | Admitting: Physician Assistant

## 2018-02-21 ENCOUNTER — Ambulatory Visit: Payer: Medicare Other | Admitting: Internal Medicine

## 2018-02-22 ENCOUNTER — Encounter: Payer: Self-pay | Admitting: *Deleted

## 2018-02-25 ENCOUNTER — Ambulatory Visit: Payer: Medicare Other | Admitting: Nurse Practitioner

## 2018-02-27 ENCOUNTER — Other Ambulatory Visit: Payer: Self-pay

## 2018-02-27 NOTE — Telephone Encounter (Signed)
Patient called requesting a refill of medication.  Medication is tramadol 50mg  taken 3tabs in morning, 2 in afternoon, and 3 before bedtime.  No mention in previous notes except on the 06-07-2017 note which stated:  Tramadol 50mg  1 tablet 4 times a day  Unsure how to refill this medication.  Please advise.

## 2018-03-04 ENCOUNTER — Telehealth: Payer: Self-pay | Admitting: Nurse Practitioner

## 2018-03-04 NOTE — Telephone Encounter (Signed)
PT SON Sara Fox CAME IN STATED THAT PT NEEDS METOPROLOL AND POTASSIUM REFILL

## 2018-03-04 NOTE — Telephone Encounter (Signed)
The pt should be on 100mg  TID, ie Tramadol 2 po TID

## 2018-03-05 MED ORDER — TRAMADOL HCL 50 MG PO TABS: ORAL_TABLET | ORAL | 5 refills | Status: DC

## 2018-03-06 ENCOUNTER — Other Ambulatory Visit: Payer: Self-pay | Admitting: Nurse Practitioner

## 2018-03-06 DIAGNOSIS — I10 Essential (primary) hypertension: Secondary | ICD-10-CM

## 2018-03-06 MED ORDER — METOPROLOL TARTRATE 25 MG PO TABS: 25.0000 mg | ORAL_TABLET | Freq: Two times a day (BID) | ORAL | 1 refills | Status: DC

## 2018-03-06 MED ORDER — POTASSIUM CHLORIDE ER 20 MEQ PO TBCR
40.0000 meq | EXTENDED_RELEASE_TABLET | ORAL | 1 refills | Status: DC
Start: 2018-03-06 — End: 2018-05-14

## 2018-03-11 ENCOUNTER — Ambulatory Visit: Payer: Medicare Other | Admitting: Nurse Practitioner

## 2018-03-12 ENCOUNTER — Ambulatory Visit (INDEPENDENT_AMBULATORY_CARE_PROVIDER_SITE_OTHER): Payer: Medicare Other | Admitting: Internal Medicine

## 2018-03-12 ENCOUNTER — Encounter: Payer: Self-pay | Admitting: Internal Medicine

## 2018-03-12 VITALS — BP 116/70 | HR 70 | Temp 97.6°F | Ht 64.0 in | Wt 150.5 lb

## 2018-03-12 DIAGNOSIS — M545 Low back pain, unspecified: Secondary | ICD-10-CM

## 2018-03-12 DIAGNOSIS — Z720 Tobacco use: Secondary | ICD-10-CM

## 2018-03-12 DIAGNOSIS — G8929 Other chronic pain: Secondary | ICD-10-CM

## 2018-03-12 DIAGNOSIS — I10 Essential (primary) hypertension: Secondary | ICD-10-CM

## 2018-03-12 NOTE — Progress Notes (Signed)
Subjective:     Patient ID: Sara Fox, female   DOB: 1940-10-09, 77 y.o.   MRN: [id number]    Pt is here for HTN. She states her rarely takes her BP, and only checks it when she feels bad, but the last time which was normal 2 months ago. Feels she is doing fairly well. Per her son who takes care of her, there are no new complaints.   Past Medical History:  Diagnosis Date  . Acute kidney injury ([place])   . Back pain   . Cervical cancer ([place])   . CHF (congestive heart failure) ([place])   . COPD (chronic obstructive pulmonary disease) ([place])   . Coronary artery disease    s/p DES x2 to RCA 2016  . GERD (gastroesophageal reflux disease)   . Hyperlipidemia   . Hypertension   . Myocardial infarction ([place])   . Neck pain   . Rhabdomyolysis   . Stomach problems     Buprenorphine hcl; Morphine and related; Celebrex [celecoxib]; Vioxx [rofecoxib]; and Codeine  Outpatient Medications Prior to Visit  Medication Sig Dispense Refill  . aspirin EC 81 MG tablet Take 81 mg by mouth daily.    . budesonide-formoterol (SYMBICORT) 160-4.5 MCG/ACT inhaler Inhale 2 puffs into the lungs 2 (two) times daily.    Marland Kitchen CALCIUM CITRATE PO Take 1 tablet by mouth daily.     . Cholecalciferol (VITAMIN D3) 2000 UNITS capsule Take 2,000 Units by mouth daily.    . furosemide (LASIX) 20 MG tablet Take 20 mg by mouth every other day.  1  . gabapentin (NEURONTIN) 100 MG capsule Take 1 capsule (100 mg total) by mouth at bedtime. 30 capsule 1  . metoprolol tartrate (LOPRESSOR) 25 MG tablet Take 1 tablet (25 mg total) by mouth 2 (two) times daily with a meal. 180 tablet 1  . Multiple Vitamin (MULTIVITAMIN) capsule Take 1 capsule by mouth daily. Contains iron    . nitroGLYCERIN (NITROSTAT) 0.4 MG SL tablet Place 1 tablet (0.4 mg total) under the tongue every 5 (five) minutes as needed for chest pain. 25 tablet 3  . oxybutynin (DITROPAN) 5 MG tablet Take 2.5 mg by mouth 2 (two) times daily.    . pantoprazole (PROTONIX) 40 MG  tablet Take 1 tablet (40 mg total) by mouth 2 (two) times daily. 180 tablet 1  . polyethylene glycol (MIRALAX / GLYCOLAX) packet Take 17 g by mouth daily as needed for mild constipation.     . Potassium Chloride ER 20 MEQ TBCR Take 40 mEq by mouth every morning. 60 tablet 1  . rosuvastatin (CRESTOR) 40 MG tablet Take 40 mg by mouth daily.  1  . traMADol (ULTRAM) 50 MG tablet Take 2 tablets by mouth three times daily 180 tablet 5  . vitamin C (ASCORBIC ACID) 500 MG tablet Take 500 mg by mouth daily.     No facility-administered medications prior to visit.      HPI  Review of Systems  Constitutional: Negative for diaphoresis and fatigue.  HENT: Negative for tinnitus.   Respiratory: Negative for chest tightness and shortness of breath.   Cardiovascular: Negative for chest pain, palpitations and leg swelling.  Gastrointestinal: Negative for constipation and diarrhea.  Musculoskeletal: Positive for back pain and gait problem.       Chronic back pain. Uses her cane when she is out to prevent from falling.   Skin: Negative for rash and wound.  Neurological: Positive for headaches. Negative for facial asymmetry, speech difficulty,  weakness and numbness.       Only gets Has on occasion. Denies bowel or urine incontinence. Though she gets mild  incontinence on occasion when she lets her bladder  Psychiatric/Behavioral: Negative for sleep disturbance.       Pt sleeps past midnight, and son who takes care of her is trying to get her on the same schedule as her husband.    She continues smoking 1ppd x 20y, used to smoke 1.5 ppd. Declines needing help to quit.  The last time she took Tramadol was    Objective:   Physical Exam   Constitutional: She is oriented to person, place, and time. She appears well-developed and well-nourished. No distress.  HENT:  Head: Normocephalic and atraumatic.  Right Ear: External ear normal.  Left Ear: External ear normal.  Nose: Nose normal.  Eyes: Conjunctivae  are normal. Right eye exhibits no discharge. Left eye exhibits no discharge. No scleral icterus.  Neck: Neck supple. No thyromegaly present.  No carotid bruits bilaterally  Cardiovascular: Normal rate and regular rhythm.  No murmur heard. Pulmonary/Chest: Effort normal and breath sounds normal. No respiratory distress.  Musculoskeletal: SPINE: she is wearing a back brace, has mild pain with lateral ROM, no pain or radicular pain with anterior spine flexion to 90 degrees. She exhibits no edema.  Lymphadenopathy:    She has no cervical adenopathy.  Neurological: She is alert and oriented to person, place, and time. Speech is normal. Neg SLR. Lower extremity strength R= 5/5, L 4/5 Skin: Skin is warm and dry. No rash noted. She is not diaphoretic.  Psychiatric: She has a normal mood and affect. Her behavior is normal. Judgment and thought content normal.  Nursing note reviewed.   Today's Vitals   03/12/18 1421  BP: 116/70  Pulse: 70  Temp: 97.6 F (36.4 C)  TempSrc: Oral  SpO2: 96%  Weight: 150 lb 8 oz (68.3 kg)  Height: 5\' 4"  (1.626 m)   Body mass index is 25.83 kg/m.      Assessment:    1. Essential hypertension- stable. May continue current meds.  - CMP14 + Anion Gap - CBC no Diff - Lipid Profile  2. Chronic bilateral low back pain without sciatica- chronic. No action.      Plan:     Labs ordered as noted. We will inform her of her results when they are back. Fu with PCP in 3 months.

## 2018-03-13 LAB — CMP14 + ANION GAP
ALT: 9 IU/L (ref 0–32)
AST: 11 IU/L (ref 0–40)
Albumin/Globulin Ratio: 1.5 (ref 1.2–2.2)
Albumin: 3.9 g/dL (ref 3.5–4.8)
Alkaline Phosphatase: 128 IU/L — ABNORMAL HIGH (ref 39–117)
Anion Gap: 13 mmol/L (ref 10.0–18.0)
BUN/Creatinine Ratio: 17 (ref 12–28)
BUN: 19 mg/dL (ref 8–27)
Bilirubin Total: <0.2 mg/dL (ref 0.0–1.2)
CO2: 21 mmol/L (ref 20–29)
Calcium: 9.7 mg/dL (ref 8.7–10.3)
Chloride: 105 mmol/L (ref 96–106)
Creatinine, Ser: 1.09 mg/dL — ABNORMAL HIGH (ref 0.57–1.00)
GFR calc Af Amer: 56 mL/min/{1.73_m2} — ABNORMAL LOW (ref >59)
GFR calc non Af Amer: 49 mL/min/{1.73_m2} — ABNORMAL LOW (ref >59)
Globulin, Total: 2.6 g/dL (ref 1.5–4.5)
Glucose: 68 mg/dL (ref 65–99)
Potassium: 4.7 mmol/L (ref 3.5–5.2)
Sodium: 139 mmol/L (ref 134–144)
Total Protein: 6.5 g/dL (ref 6.0–8.5)

## 2018-03-13 LAB — CBC
Hematocrit: 31.1 % — ABNORMAL LOW (ref 34.0–46.6)
Hemoglobin: 10.1 g/dL — ABNORMAL LOW (ref 11.1–15.9)
MCH: 30.1 pg (ref 26.6–33.0)
MCHC: 32.5 g/dL (ref 31.5–35.7)
MCV: 93 fL (ref 79–97)
Platelets: 387 10*3/uL (ref 150–450)
RBC: 3.36 x10E6/uL — ABNORMAL LOW (ref 3.77–5.28)
RDW: 12.4 % (ref 12.3–15.4)
WBC: 5.9 10*3/uL (ref 3.4–10.8)

## 2018-03-13 LAB — LIPID PANEL
Chol/HDL Ratio: 4 ratio (ref 0.0–4.4)
Cholesterol, Total: 231 mg/dL — ABNORMAL HIGH (ref 100–199)
HDL: 58 mg/dL (ref >39)
LDL Calculated: 149 mg/dL — ABNORMAL HIGH (ref 0–99)
Triglycerides: 119 mg/dL (ref 0–149)
VLDL Cholesterol Cal: 24 mg/dL (ref 5–40)

## 2018-03-18 ENCOUNTER — Other Ambulatory Visit: Payer: Self-pay | Admitting: Physical Medicine & Rehabilitation

## 2018-03-18 LAB — SPECIMEN STATUS REPORT

## 2018-03-18 LAB — ALKALINE PHOSPHATASE, ISOENZYMES
Alkaline Phosphatase: 126 IU/L — ABNORMAL HIGH (ref 39–117)
BONE FRACTION: 35 % (ref 14–68)
INTESTINAL FRAC.: 6 % (ref 0–18)
LIVER FRACTION: 59 % (ref 18–85)

## 2018-03-25 ENCOUNTER — Ambulatory Visit: Payer: Medicare Other | Admitting: Physician Assistant

## 2018-03-26 ENCOUNTER — Encounter: Payer: Self-pay | Admitting: Adult Health

## 2018-03-26 ENCOUNTER — Ambulatory Visit (INDEPENDENT_AMBULATORY_CARE_PROVIDER_SITE_OTHER): Payer: Medicare Other | Admitting: Adult Health

## 2018-03-26 VITALS — BP 137/74 | HR 67 | Ht 64.0 in | Wt 145.6 lb

## 2018-03-26 DIAGNOSIS — I2510 Atherosclerotic heart disease of native coronary artery without angina pectoris: Secondary | ICD-10-CM | POA: Diagnosis not present

## 2018-03-26 DIAGNOSIS — I5032 Chronic diastolic (congestive) heart failure: Secondary | ICD-10-CM | POA: Diagnosis not present

## 2018-03-26 DIAGNOSIS — I10 Essential (primary) hypertension: Secondary | ICD-10-CM | POA: Diagnosis not present

## 2018-03-26 NOTE — Patient Instructions (Signed)
Medication Instructions:  Your physician recommends that you continue on your current medications as directed. Please refer to the Current Medication list given to you today.  If you need a refill on your cardiac medications before your next appointment, please call your pharmacy.   Follow-Up: At [facility], you and your health needs are our priority.  As part of our continuing mission to provide you with exceptional heart care, we have created designated Provider Care Teams.  These Care Teams include your primary Cardiologist (physician) and Advanced Practice Providers (APPs -  Physician Assistants and Nurse Practitioners) who all work together to provide you with the care you need, when you need it. You will need a follow up appointment in 6 months with Dr. Debara Pickett. Please call our office 2 months ahead of time to schedule your appointment.  Advanced Practice Providers on your designated Care Team: [place], [place] . Fabian Sharp, PA-C  Any Other Special Instructions Will Be Listed Below (If Applicable). none

## 2018-03-26 NOTE — Progress Notes (Signed)
Cardiology Office Note   Date:  03/26/2018   ID:  Sara Fox, DOB March 12, 1941, MRN [id number]  PCP:  [name] Chard, MD  Cardiologist: Dr.Hilty  Chief Complaint  Patient presents with  . Follow-up     History of Present Illness: Sara Fox is a 77 y.o. female who presents for ongoing assessment and management of chest pain, She underwent cardiac catheterization by Dr. Ellyn Hack in had a severe mid to distal RCA stenosis. She had a placement of a long 33 mm drug-eluting stent which was designed to [employer] stress test was ordered. Stress test reported that there was no ischemia with mildly reduced LVEF.   She is doing well and is tolerating her medications. She is taking NTG occasionally, once or twice a month  No issues with dizziness or dyspnea.   Past Medical History:  Diagnosis Date  . Acute kidney injury ([place])   . Back pain   . Cervical cancer ([place])   . CHF (congestive heart failure) ([place])   . COPD (chronic obstructive pulmonary disease) ([place])   . Coronary artery disease    s/p DES x2 to RCA 2016  . GERD (gastroesophageal reflux disease)   . Hyperlipidemia   . Hypertension   . Myocardial infarction ([place])   . Neck pain   . Rhabdomyolysis   . Stomach problems     Past Surgical History:  Procedure Laterality Date  . ABDOMINAL HYSTERECTOMY    . BACK SURGERY  2012   lower back  . CARDIAC CATHETERIZATION  06/1999   noncritical disease invovling PDA  . CARDIAC CATHETERIZATION N/A 09/14/2014   Procedure: Left Heart Cath and Coronary Angiography;  Surgeon: Leonie Man, MD;  Location: [facility] INVASIVE CV LAB CUPID;  Service: Cardiovascular;  Laterality: N/A;  . CHOLECYSTECTOMY    . ESOPHAGOGASTRODUODENOSCOPY (EGD) WITH PROPOFOL Left 07/19/2017   Procedure: ESOPHAGOGASTRODUODENOSCOPY (EGD) WITH PROPOFOL;  Surgeon: Ronnette Juniper, MD;  Location: [facility] ENDOSCOPY;  Service: Gastroenterology;  Laterality: Left;  . FRACTURE SURGERY Left 05/2017   left wrist  . HARDWARE REMOVAL  Left 11/07/2017   Procedure: LEFT WRIST HARDWARE REMOVAL;  Surgeon: Charlotte Crumb, MD;  Location: [place] [facility];  Service: Orthopedics;  Laterality: Left;  . KNEE SURGERY Bilateral 2001 & 2007  . NECK SURGERY  2012   2012  . PARTIAL GASTRECTOMY  2005   subtotal  . PERCUTANEOUS CORONARY STENT INTERVENTION (PCI-S)  09/14/2014   Procedure: Percutaneous Coronary Stent Intervention (Pci-S);  Surgeon: Leonie Man, MD;  Location: [facility] INVASIVE CV LAB CUPID;  Service: Cardiovascular;;  . SHOULDER SURGERY Right   . TRANSTHORACIC ECHOCARDIOGRAM  06/02/2010   EF=>55%, normal LV systolic function; normal RV systolic function; mild mitral annular calcif; trace TR; AV mildly sclerotic  . WRIST OSTEOTOMY Left 11/07/2017   Procedure: LEFT WRIST DISTAL ULNA RESECTION WITH EXTENSOR CARPI ULNARIS STABILIZATION;  Surgeon: Charlotte Crumb, MD;  Location: [place];  Service: Orthopedics;  Laterality: Left;     Current Outpatient Medications  Medication Sig Dispense Refill  . aspirin EC 81 MG tablet Take 81 mg by mouth daily.    . budesonide-formoterol (SYMBICORT) 160-4.5 MCG/ACT inhaler Inhale 2 puffs into the lungs 2 (two) times daily.    Marland Kitchen CALCIUM CITRATE PO Take 1 tablet by mouth daily.     . Cholecalciferol (VITAMIN D3) 2000 UNITS capsule Take 2,000 Units by mouth daily.    . furosemide (LASIX) 20 MG tablet Take 20 mg by mouth every other day.  1  .  gabapentin (NEURONTIN) 100 MG capsule Take 100 mg by mouth 4 (four) times daily.    . metoprolol tartrate (LOPRESSOR) 25 MG tablet Take 1 tablet (25 mg total) by mouth 2 (two) times daily with a meal. 180 tablet 1  . Multiple Vitamin (MULTIVITAMIN) capsule Take 1 capsule by mouth daily. Contains iron    . nitroGLYCERIN (NITROSTAT) 0.4 MG SL tablet Place 1 tablet (0.4 mg total) under the tongue every 5 (five) minutes as needed for chest pain. 25 tablet 3  . oxybutynin (DITROPAN) 5 MG tablet Take 2.5 mg by mouth 2 (two) times daily.    . pantoprazole (PROTONIX) 40 MG  tablet Take 1 tablet (40 mg total) by mouth 2 (two) times daily. 180 tablet 1  . polyethylene glycol (MIRALAX / GLYCOLAX) packet Take 17 g by mouth daily as needed for mild constipation.     . Potassium Chloride ER 20 MEQ TBCR Take 40 mEq by mouth every morning. 60 tablet 1  . traMADol (ULTRAM) 50 MG tablet Take 2 tablets by mouth three times daily 180 tablet 5  . vitamin C (ASCORBIC ACID) 500 MG tablet Take 500 mg by mouth daily.     No current facility-administered medications for this visit.     Allergies:   Buprenorphine hcl; Morphine and related; Celebrex [celecoxib]; Vioxx [rofecoxib]; and Codeine    Social History:  The patient  reports that she has been smoking. She has a 60.00 pack-year smoking history. She has never used smokeless tobacco. She reports that she does not drink alcohol or use drugs.   Family History:  The patient's family history includes Colon cancer in her mother; Diabetes in her mother; Heart attack in her brother; Heart disease in her brother, brother, and mother; Hypertension in her brother and brother.    ROS: All other systems are reviewed and negative. Unless otherwise mentioned in H&P    PHYSICAL EXAM: VS:  BP 137/74   Pulse 67   Ht 5\' 4"  (1.626 m)   Wt 145 lb 9.6 oz (66 kg)   BMI 24.99 kg/m  , BMI Body mass index is 24.99 kg/m. GEN: Well nourished, well developed, in no acute distress HEENT: normal Neck: no JVD, carotid bruits, or masses Cardiac: RRR; no murmurs, rubs, or gallops,no edema  Respiratory:  Clear to auscultation bilaterally, normal work of breathing GI: soft, nontender, nondistended, + BS MS: no deformity or atrophy Skin: warm and dry, no rash Neuro:  Strength and sensation are intact Psych: euthymic mood, full affect   EKG:  Not completed this office visit.   Recent Labs: 07/10/2017: TSH 0.926 07/20/2017: Magnesium 1.8 03/12/2018: ALT 9; BUN 19; Creatinine, Ser 1.09; Hemoglobin 10.1; Platelets 387; Potassium 4.7; Sodium 139     Lipid Panel    Component Value Date/Time   CHOL 231 (H) 03/12/2018 1450   TRIG 119 03/12/2018 1450   HDL 58 03/12/2018 1450   CHOLHDL 4.0 03/12/2018 1450   CHOLHDL 3.5 09/12/2014 1041   VLDL 12 09/12/2014 1041   LDLCALC 149 (H) 03/12/2018 1450      Wt Readings from Last 3 Encounters:  03/26/18 145 lb 9.6 oz (66 kg)  03/12/18 150 lb 8 oz (68.3 kg)  12/12/17 141 lb (64 kg)      Other studies Reviewed: Study Highlights     The left ventricular ejection fraction is mildly decreased (45-54%).  Nuclear stress EF: 48%.  No T wave inversion was noted during stress.  There was no ST segment deviation  noted during stress.  This is a low risk study.   No reversible ischemia. LVEF 48% with mild basal to mid inferior wall hypokinesis. This is an intermediate risk study due to decreased LV function.     ASSESSMENT AND PLAN:  1. CAD: Hx of RCA stenosis requiring a DES in 2016. Lexiscan was normal. Will continue current regimen for now as she is essentially symptomatic to include BB and ASA.   2. Chronic Diastolic Dysfunction:  She does not appear to have fluid overload. She is stable at this time. Continue current diuretic regimen with lasix 20 mg daily.   3. Hypertension: Well controlled today.   Current medicines are reviewed at length with the patient today.    Labs/ tests ordered today include: None  Phill Myron. [place] Pugh, ANP, AACC   03/26/2018 5:33 PM    [address] Office [phone] Fax [phone]

## 2018-04-08 ENCOUNTER — Other Ambulatory Visit: Payer: Self-pay | Admitting: Nurse Practitioner

## 2018-04-14 ENCOUNTER — Other Ambulatory Visit: Payer: Self-pay | Admitting: Nurse Practitioner

## 2018-04-22 ENCOUNTER — Other Ambulatory Visit: Payer: Self-pay | Admitting: Nurse Practitioner

## 2018-04-22 DIAGNOSIS — R32 Unspecified urinary incontinence: Secondary | ICD-10-CM

## 2018-04-22 MED ORDER — OXYBUTYNIN CHLORIDE 5 MG PO TABS: 2.5000 mg | ORAL_TABLET | Freq: Two times a day (BID) | ORAL | 3 refills | Status: DC

## 2018-04-29 ENCOUNTER — Other Ambulatory Visit: Payer: Medicare Other

## 2018-05-14 ENCOUNTER — Other Ambulatory Visit: Payer: Self-pay | Admitting: Nurse Practitioner

## 2018-05-14 DIAGNOSIS — I10 Essential (primary) hypertension: Secondary | ICD-10-CM

## 2018-05-21 ENCOUNTER — Other Ambulatory Visit (HOSPITAL_COMMUNITY): Payer: Self-pay | Admitting: *Deleted

## 2018-05-22 ENCOUNTER — Ambulatory Visit (HOSPITAL_COMMUNITY)
Admission: RE | Admit: 2018-05-22 | Discharge: 2018-05-22 | Disposition: A | Discharge status: Home / Self Care | Payer: Medicare Other | Source: Ambulatory Visit | Attending: Nephrology | Admitting: Nephrology

## 2018-05-22 DIAGNOSIS — N189 Chronic kidney disease, unspecified: Secondary | ICD-10-CM | POA: Diagnosis not present

## 2018-05-22 DIAGNOSIS — D631 Anemia in chronic kidney disease: Secondary | ICD-10-CM | POA: Diagnosis not present

## 2018-05-22 MED ORDER — SODIUM CHLORIDE 0.9 % IV SOLN
510.0000 mg | INTRAVENOUS | Status: DC
  Administered 2018-05-22: 510 mg via INTRAVENOUS
  Filled 2018-05-22: qty 510

## 2018-05-22 NOTE — Discharge Instructions (Signed)

## 2018-05-29 ENCOUNTER — Ambulatory Visit: Payer: Medicare Other | Admitting: Nurse Practitioner

## 2018-05-29 ENCOUNTER — Encounter (HOSPITAL_COMMUNITY)
Admission: RE | Admit: 2018-05-29 | Discharge: 2018-05-29 | Disposition: A | Discharge status: Home / Self Care | Payer: Medicare Other | Source: Ambulatory Visit | Attending: Nephrology | Admitting: Nephrology

## 2018-05-29 DIAGNOSIS — D631 Anemia in chronic kidney disease: Secondary | ICD-10-CM | POA: Diagnosis not present

## 2018-05-29 DIAGNOSIS — N183 Chronic kidney disease, stage 3 (moderate): Secondary | ICD-10-CM | POA: Diagnosis not present

## 2018-05-29 MED ORDER — SODIUM CHLORIDE 0.9 % IV SOLN
510.0000 mg | INTRAVENOUS | Status: DC
  Administered 2018-05-29: 510 mg via INTRAVENOUS
  Filled 2018-05-29: qty 510

## 2018-06-03 ENCOUNTER — Ambulatory Visit (INDEPENDENT_AMBULATORY_CARE_PROVIDER_SITE_OTHER): Payer: Medicare Other | Admitting: Nurse Practitioner

## 2018-06-03 ENCOUNTER — Encounter: Payer: Self-pay | Admitting: Nurse Practitioner

## 2018-06-03 ENCOUNTER — Other Ambulatory Visit: Payer: Self-pay

## 2018-06-03 VITALS — BP 140/70 | HR 82 | Temp 98.2°F | Ht 63.0 in | Wt 150.0 lb

## 2018-06-03 DIAGNOSIS — N183 Chronic kidney disease, stage 3 unspecified: Secondary | ICD-10-CM | POA: Insufficient documentation

## 2018-06-03 DIAGNOSIS — I129 Hypertensive chronic kidney disease with stage 1 through stage 4 chronic kidney disease, or unspecified chronic kidney disease: Secondary | ICD-10-CM | POA: Diagnosis not present

## 2018-06-03 DIAGNOSIS — E7800 Pure hypercholesterolemia, unspecified: Secondary | ICD-10-CM | POA: Diagnosis not present

## 2018-06-03 DIAGNOSIS — I10 Essential (primary) hypertension: Secondary | ICD-10-CM

## 2018-06-03 DIAGNOSIS — D649 Anemia, unspecified: Secondary | ICD-10-CM

## 2018-06-03 NOTE — Progress Notes (Signed)
Subjective:     Patient ID: Sara Fox , female    DOB: December 01, 1940 , 78 y.o.   MRN: [id number]   Chief Complaint  Patient presents with  . chronic conditions    3 m f/u     HPI  Cholesterol - she is tolerating her medications well.   She also had an iron transfusion done x 2 by Dr. Posey Pronto.  Has not had her blood rechecked.  Denies any symptoms she is here today with a caregiver dgt Lolita Cram)  Hypertension  This is a chronic problem. The current episode started more than 1 year ago. The problem is unchanged. The problem is controlled. Pertinent negatives include no anxiety, chest pain, headaches, palpitations, peripheral edema or shortness of breath. There are no associated agents to hypertension. Risk factors for coronary artery disease include sedentary lifestyle. Past treatments include diuretics. There are no compliance problems.  There is no history of angina. There is no history of chronic renal disease.     Past Medical History:  Diagnosis Date  . Acute kidney injury ([place])   . Back pain   . Cervical cancer ([place])   . CHF (congestive heart failure) ([place])   . COPD (chronic obstructive pulmonary disease) ([place])   . Coronary artery disease    s/p DES x2 to RCA 2016  . GERD (gastroesophageal reflux disease)   . Hyperlipidemia   . Hypertension   . Myocardial infarction ([place])   . Neck pain   . Rhabdomyolysis   . Stomach problems      Family History  Problem Relation Age of Onset  . Heart disease Mother        also HTN  . Diabetes Mother   . Colon cancer Mother   . Heart disease Brother        deceased at 58  . Hypertension Brother   . Heart attack Brother   . Heart disease Brother   . Hypertension Brother      Current Outpatient Medications:  .  aspirin EC 81 MG tablet, Take 81 mg by mouth daily., Disp: , Rfl:  .  budesonide-formoterol (SYMBICORT) 160-4.5 MCG/ACT inhaler, Inhale 2 puffs into the lungs 2 (two) times daily., Disp: , Rfl:  .  CALCIUM  CITRATE PO, Take 1 tablet by mouth daily. , Disp: , Rfl:  .  Cholecalciferol (VITAMIN D3) 2000 UNITS capsule, Take 2,000 Units by mouth daily., Disp: , Rfl:  .  furosemide (LASIX) 20 MG tablet, Take 20 mg by mouth every other day., Disp: , Rfl: 1 .  gabapentin (NEURONTIN) 100 MG capsule, Take 100 mg by mouth 4 (four) times daily., Disp: , Rfl:  .  metoprolol tartrate (LOPRESSOR) 25 MG tablet, Take 1 tablet (25 mg total) by mouth 2 (two) times daily with a meal., Disp: 180 tablet, Rfl: 1 .  Multiple Vitamin (MULTIVITAMIN) capsule, Take 1 capsule by mouth daily. Contains iron, Disp: , Rfl:  .  nitroGLYCERIN (NITROSTAT) 0.4 MG SL tablet, Place 1 tablet (0.4 mg total) under the tongue every 5 (five) minutes as needed for chest pain., Disp: 25 tablet, Rfl: 3 .  oxybutynin (DITROPAN) 5 MG tablet, Take 0.5 tablets (2.5 mg total) by mouth 2 (two) times daily., Disp: 30 tablet, Rfl: 3 .  pantoprazole (PROTONIX) 40 MG tablet, Take 1 tablet (40 mg total) by mouth 2 (two) times daily., Disp: 180 tablet, Rfl: 1 .  polyethylene glycol (MIRALAX / GLYCOLAX) packet, Take 17 g by mouth daily as needed  for mild constipation. , Disp: , Rfl:  .  Potassium Chloride ER 20 MEQ TBCR, TAKE 2 TABLETS BY MOUTH EVERY MORNING, Disp: 60 tablet, Rfl: 0 .  rosuvastatin (CRESTOR) 40 MG tablet, TAKE 1 TABLET BY MOUTH ONCE DAILY, Disp: 90 tablet, Rfl: 1 .  traMADol (ULTRAM) 50 MG tablet, Take 2 tablets by mouth three times daily, Disp: 180 tablet, Rfl: 5 .  vitamin C (ASCORBIC ACID) 500 MG tablet, Take 500 mg by mouth daily., Disp: , Rfl:    Allergies  Allergen Reactions  . Buprenorphine Hcl Shortness Of Breath    Throat swelling/trouble breathing and lethargic  . Morphine And Related Shortness Of Breath    Throat swelling/trouble breathing and lethargic  . Celebrex [Celecoxib] Diarrhea and Swelling    Swelling of legs   . Vioxx [Rofecoxib] Palpitations  . Codeine Other (See Comments)    Bloated      Review of Systems   Constitutional: Negative.  Negative for fatigue.  Eyes: Negative for visual disturbance.  Respiratory: Negative.  Negative for shortness of breath.   Cardiovascular: Negative.  Negative for chest pain, palpitations and leg swelling.  Gastrointestinal: Negative.   Endocrine: Negative.   Musculoskeletal: Negative.   Skin: Negative.   Neurological: Negative for dizziness, weakness and headaches.  Psychiatric/Behavioral: Negative for confusion. The patient is not nervous/anxious.      Today's Vitals   06/03/18 1544  BP: 140/70  Pulse: 82  Temp: 98.2 F (36.8 C)  TempSrc: Oral  SpO2: 96%  Weight: 150 lb (68 kg)  Height: 5\' 3"  (1.6 m)   Body mass index is 26.57 kg/m.   Objective:  Physical Exam Vitals signs reviewed.  Constitutional:      Appearance: Normal appearance.  Cardiovascular:     Rate and Rhythm: Normal rate and regular rhythm.     Pulses: Normal pulses.     Heart sounds: Normal heart sounds. No murmur.  Pulmonary:     Effort: Pulmonary effort is normal. No respiratory distress.     Breath sounds: Normal breath sounds.  Skin:    General: Skin is warm and dry.  Neurological:     General: No focal deficit present.  Psychiatric:        Mood and Affect: Mood normal.        Behavior: Behavior normal.         Assessment And Plan:     1. Chronic anemia  Being followed by Dr. Posey Pronto  Had 2 iron transfusions  Will recheck levels today and encouraged to take an iron supplement daily  Make sure having regular bowel movements  2. Stage 3 chronic kidney disease ([place])  Being followed by Dr. Posey Pronto  3. Essential hypertension . B/P is controlled.  . CMP ordered to check renal function.  . The importance of regular exercise and dietary modification was stressed to the patient.  - Iron, TIBC and Ferritin Panel - CBC with Diff  4. Hypercholesterolemia Chronic, controlled Continue with current medications - Lipid panel   [name], FNP

## 2018-06-04 LAB — CBC WITH DIFFERENTIAL/PLATELET
Basophils Absolute: 0 10*3/uL (ref 0.0–0.2)
Basos: 0 %
EOS (ABSOLUTE): 0.2 10*3/uL (ref 0.0–0.4)
Eos: 3 %
Hematocrit: 30.4 % — ABNORMAL LOW (ref 34.0–46.6)
Hemoglobin: 10.1 g/dL — ABNORMAL LOW (ref 11.1–15.9)
Immature Grans (Abs): 0 10*3/uL (ref 0.0–0.1)
Immature Granulocytes: 1 %
Lymphocytes Absolute: 1.9 10*3/uL (ref 0.7–3.1)
Lymphs: 31 %
MCH: 30.7 pg (ref 26.6–33.0)
MCHC: 33.2 g/dL (ref 31.5–35.7)
MCV: 92 fL (ref 79–97)
Monocytes Absolute: 0.9 10*3/uL (ref 0.1–0.9)
Monocytes: 15 %
Neutrophils Absolute: 3 10*3/uL (ref 1.4–7.0)
Neutrophils: 50 %
Platelets: 340 10*3/uL (ref 150–450)
RBC: 3.29 x10E6/uL — ABNORMAL LOW (ref 3.77–5.28)
RDW: 13.2 % (ref 11.7–15.4)
WBC: 6 10*3/uL (ref 3.4–10.8)

## 2018-06-04 LAB — LIPID PANEL
Chol/HDL Ratio: 3.9 ratio (ref 0.0–4.4)
Cholesterol, Total: 223 mg/dL — ABNORMAL HIGH (ref 100–199)
HDL: 57 mg/dL (ref >39)
LDL Calculated: 141 mg/dL — ABNORMAL HIGH (ref 0–99)
Triglycerides: 124 mg/dL (ref 0–149)
VLDL Cholesterol Cal: 25 mg/dL (ref 5–40)

## 2018-06-04 LAB — IRON,TIBC AND FERRITIN PANEL
Ferritin: 772 ng/mL — ABNORMAL HIGH (ref 15–150)
Iron Saturation: 26 % (ref 15–55)
Iron: 81 ug/dL (ref 27–139)
Total Iron Binding Capacity: 317 ug/dL (ref 250–450)
UIBC: 236 ug/dL (ref 118–369)

## 2018-06-10 ENCOUNTER — Encounter: Payer: Medicare Other | Attending: Registered Nurse | Admitting: Registered Nurse

## 2018-06-20 ENCOUNTER — Other Ambulatory Visit: Payer: Self-pay | Admitting: Nurse Practitioner

## 2018-06-20 DIAGNOSIS — I10 Essential (primary) hypertension: Secondary | ICD-10-CM

## 2018-07-05 ENCOUNTER — Ambulatory Visit: Payer: Medicare Other | Admitting: Registered Nurse

## 2018-07-08 ENCOUNTER — Encounter: Payer: Self-pay | Admitting: Registered Nurse

## 2018-07-08 ENCOUNTER — Encounter: Payer: Medicare Other | Attending: Registered Nurse | Admitting: Registered Nurse

## 2018-07-08 VITALS — BP 143/81 | HR 71 | Ht 65.0 in | Wt 145.0 lb

## 2018-07-08 DIAGNOSIS — Z8541 Personal history of malignant neoplasm of cervix uteri: Secondary | ICD-10-CM | POA: Insufficient documentation

## 2018-07-08 DIAGNOSIS — I110 Hypertensive heart disease with heart failure: Secondary | ICD-10-CM | POA: Diagnosis not present

## 2018-07-08 DIAGNOSIS — Z5181 Encounter for therapeutic drug level monitoring: Secondary | ICD-10-CM

## 2018-07-08 DIAGNOSIS — G894 Chronic pain syndrome: Secondary | ICD-10-CM

## 2018-07-08 DIAGNOSIS — J449 Chronic obstructive pulmonary disease, unspecified: Secondary | ICD-10-CM | POA: Insufficient documentation

## 2018-07-08 DIAGNOSIS — I252 Old myocardial infarction: Secondary | ICD-10-CM | POA: Insufficient documentation

## 2018-07-08 DIAGNOSIS — Z955 Presence of coronary angioplasty implant and graft: Secondary | ICD-10-CM | POA: Diagnosis not present

## 2018-07-08 DIAGNOSIS — M25511 Pain in right shoulder: Secondary | ICD-10-CM | POA: Insufficient documentation

## 2018-07-08 DIAGNOSIS — K219 Gastro-esophageal reflux disease without esophagitis: Secondary | ICD-10-CM | POA: Insufficient documentation

## 2018-07-08 DIAGNOSIS — Z760 Encounter for issue of repeat prescription: Secondary | ICD-10-CM | POA: Diagnosis not present

## 2018-07-08 DIAGNOSIS — Z8249 Family history of ischemic heart disease and other diseases of the circulatory system: Secondary | ICD-10-CM | POA: Diagnosis not present

## 2018-07-08 DIAGNOSIS — M5442 Lumbago with sciatica, left side: Secondary | ICD-10-CM | POA: Diagnosis not present

## 2018-07-08 DIAGNOSIS — G8929 Other chronic pain: Secondary | ICD-10-CM

## 2018-07-08 DIAGNOSIS — I509 Heart failure, unspecified: Secondary | ICD-10-CM | POA: Diagnosis not present

## 2018-07-08 DIAGNOSIS — Z79899 Other long term (current) drug therapy: Secondary | ICD-10-CM

## 2018-07-08 DIAGNOSIS — M961 Postlaminectomy syndrome, not elsewhere classified: Secondary | ICD-10-CM | POA: Diagnosis not present

## 2018-07-08 DIAGNOSIS — M533 Sacrococcygeal disorders, not elsewhere classified: Secondary | ICD-10-CM | POA: Diagnosis not present

## 2018-07-08 DIAGNOSIS — I2510 Atherosclerotic heart disease of native coronary artery without angina pectoris: Secondary | ICD-10-CM | POA: Diagnosis not present

## 2018-07-08 DIAGNOSIS — M545 Low back pain: Secondary | ICD-10-CM | POA: Diagnosis not present

## 2018-07-08 DIAGNOSIS — E785 Hyperlipidemia, unspecified: Secondary | ICD-10-CM | POA: Diagnosis not present

## 2018-07-08 DIAGNOSIS — F17210 Nicotine dependence, cigarettes, uncomplicated: Secondary | ICD-10-CM | POA: Insufficient documentation

## 2018-07-08 MED ORDER — TRAMADOL HCL 50 MG PO TABS: ORAL_TABLET | ORAL | 0 refills | Status: DC

## 2018-07-08 NOTE — Patient Instructions (Signed)
Call Office in two weeks to evaluate medication change   [phone]

## 2018-07-08 NOTE — Progress Notes (Signed)
Subjective:    Patient ID: Sara Fox Fox, female    DOB: Nov 20, 1940, 78 y.o.   MRN: [id number]  HPI : Sara Fox Fox is a 78 y.o. female who returns for follow up appointment for chronic pain and medication refill. She states her pain is located in her right shoulder and mid- lower back pain. She rates her  Pain 8. Her current exercise regime is walking.   Sara Fox Fox son in room and states he puts  Sara Fox Fox medication in her pill container, last Tramadol was filled  on 05/15/2018. We discussed medication and frequency, he states she has Tramadol at home, he was instructed to call office with Tramadol  count, he verbalizes understanding. Today we will began a slow weaning of Tramadol tablets, he verbalizes understanding. Also instructed to call office for medication evaluation in two weeks he verbalizes understanding.   Sara Fox Fox Morphine equivalent is 30.00 MME.  Last UDS was Performed on 12/07/2017, it was consistent.   Pain Inventory Average Pain 6 Pain Right Now 8 My pain is sharp  In the last 24 hours, has pain interfered with the following? General activity 9 Relation with others 8 Enjoyment of life 9 What TIME of day is your pain at its worst? daytime Sleep (in general) Fair  Pain is worse with: walking and some activites Pain improves with: rest and TENS Relief from Meds: 6  Mobility use a cane ability to climb steps?  yes do you drive?  no  Function disabled: date disabled 2001 I need assistance with the following:  bathing  Neuro/Psych bladder control problems weakness spasms  Prior Studies Any changes since last visit?  no  Physicians involved in your care Any changes since last visit?  no   Family History  Problem Relation Age of Onset  . Heart disease Mother        also HTN  . Diabetes Mother   . Colon cancer Mother   . Heart disease Brother        deceased at 54  . Hypertension Brother   . Heart attack Brother   . Heart disease Brother   .  Hypertension Brother    Social History   Socioeconomic History  . Marital status: Married    Spouse name: Not on file  . Number of children: 2  . Years of education: GED  . Highest education level: Not on file  Occupational History    Employer: RETIRED  Social Needs  . Financial resource strain: Not on file  . Food insecurity:    Worry: Not on file    Inability: Not on file  . Transportation needs:    Medical: Not on file    Non-medical: Not on file  Tobacco Use  . Smoking status: Current Every Day Smoker    Packs/day: 1.00    Years: 60.00    Pack years: 60.00  . Smokeless tobacco: Never Used  . Tobacco comment: currently smokes 1/2ppd or less  Substance and Sexual Activity  . Alcohol use: No  . Drug use: No  . Sexual activity: Not on file  Lifestyle  . Physical activity:    Days per week: Not on file    Minutes per session: Not on file  . Stress: Not on file  Relationships  . Social connections:    Talks on phone: Not on file    Gets together: Not on file    Attends religious service: Not on file    Active  member of club or organization: Not on file    Attends meetings of clubs or organizations: Not on file    Relationship status: Not on file  Other Topics Concern  . Not on file  Social History Narrative  . Not on file   Past Surgical History:  Procedure Laterality Date  . ABDOMINAL HYSTERECTOMY    . BACK SURGERY  2012   lower back  . CARDIAC CATHETERIZATION  06/1999   noncritical disease invovling PDA  . CARDIAC CATHETERIZATION N/A 09/14/2014   Procedure: Left Heart Cath and Coronary Angiography;  Surgeon: Leonie Man, MD;  Location: [facility] INVASIVE CV LAB CUPID;  Service: Cardiovascular;  Laterality: N/A;  . CHOLECYSTECTOMY    . ESOPHAGOGASTRODUODENOSCOPY (EGD) WITH PROPOFOL Left 07/19/2017   Procedure: ESOPHAGOGASTRODUODENOSCOPY (EGD) WITH PROPOFOL;  Surgeon: Ronnette Juniper, MD;  Location: [facility] ENDOSCOPY;  Service: Gastroenterology;  Laterality: Left;  .  FRACTURE SURGERY Left 05/2017   left wrist  . HARDWARE REMOVAL Left 11/07/2017   Procedure: LEFT WRIST HARDWARE REMOVAL;  Surgeon: Charlotte Crumb, MD;  Location: [place];  Service: Orthopedics;  Laterality: Left;  . KNEE SURGERY Bilateral 2001 & 2007  . NECK SURGERY  2012   2012  . PARTIAL GASTRECTOMY  2005   subtotal  . PERCUTANEOUS CORONARY STENT INTERVENTION (PCI-S)  09/14/2014   Procedure: Percutaneous Coronary Stent Intervention (Pci-S);  Surgeon: Leonie Man, MD;  Location: [facility] INVASIVE CV LAB CUPID;  Service: Cardiovascular;;  . SHOULDER SURGERY Right   . TRANSTHORACIC ECHOCARDIOGRAM  06/02/2010   EF=>55%, normal LV systolic function; normal RV systolic function; mild mitral annular calcif; trace TR; AV mildly sclerotic  . WRIST OSTEOTOMY Left 11/07/2017   Procedure: LEFT WRIST DISTAL ULNA RESECTION WITH EXTENSOR CARPI ULNARIS STABILIZATION;  Surgeon: Charlotte Crumb, MD;  Location: [facility];  Service: Orthopedics;  Laterality: Left;   Past Medical History:  Diagnosis Date  . Acute kidney injury ([place])   . Back pain   . Cervical cancer ([place])   . CHF (congestive heart failure) ([place])   . COPD (chronic obstructive pulmonary disease) ([place])   . Coronary artery disease    s/p DES x2 to RCA 2016  . GERD (gastroesophageal reflux disease)   . Hyperlipidemia   . Hypertension   . Myocardial infarction ([place])   . Neck pain   . Rhabdomyolysis   . Stomach problems    BP (!) 143/81   Pulse 71   Ht 5\' 5"  (1.651 m)   Wt 145 lb (65.8 kg)   SpO2 98%   BMI 24.13 kg/m   Opioid Risk Score:   Fall Risk Score:  `1  Depression screen PHQ 2/9  Depression screen [facility] 2/9 06/03/2018 03/12/2018 02/05/2015 08/06/2014  Decreased Interest 0 0 0 0  Down, Depressed, Hopeless 0 0 0 0  PHQ - 2 Score 0 0 0 0  Altered sleeping - - 0 1  Tired, decreased energy - - 1 2  Change in appetite - - 0 1  Feeling bad or failure about yourself  - - 0 0  Trouble concentrating - - 1 0  Moving slowly or  fidgety/restless - - 0 0  Suicidal thoughts - - 0 0  PHQ-9 Score - - 2 4     Review of Systems  Constitutional: Negative.   HENT: Negative.   Eyes: Negative.   Respiratory: Negative.   Cardiovascular: Negative.   Gastrointestinal: Negative.   Endocrine: Negative.   Genitourinary: Positive for difficulty urinating.  Musculoskeletal: Positive  for arthralgias, back pain and myalgias.  Skin: Negative.   Allergic/Immunologic: Negative.   Neurological: Positive for weakness.  Hematological: Negative.   Psychiatric/Behavioral: Negative.   All other systems reviewed and are negative.      Objective:   Physical Exam Vitals signs and nursing note reviewed.  Constitutional:      Appearance: Normal appearance.  Neck:     Musculoskeletal: Normal range of motion and neck supple.  Cardiovascular:     Rate and Rhythm: Normal rate and regular rhythm.     Pulses: Normal pulses.     Heart sounds: Normal heart sounds.  Pulmonary:     Effort: Pulmonary effort is normal.     Breath sounds: Normal breath sounds.  Musculoskeletal:     Comments: Normal Muscle Bulk and Muscle Testing Reveals:  Upper Extremities: Full ROM and Muscle Strength 5/5 Thoracic Paraspinal Tenderness: T-7-T-9 Lumbar Paraspinal Tenderness: L-4-L-5 Lower Extremities: Full ROM and Muscle Strength 5/5 Arises from Table with ease Narrow Based Gait   Skin:    General: Skin is warm and dry.  Neurological:     Mental Status: She is alert and oriented to person, place, and time.  Psychiatric:        Mood and Affect: Mood normal.        Behavior: Behavior normal.           Assessment & Plan:  1. Cervical Postlaminectomy syndrome: Continue HEP as Tolerated. Continue to monitor. 07/08/2018 2. Bilateral sacroiliac pain after lumbar fusion.  Refilled: Decreased:Tramadol 50 mg one - 2 tablets 3 times daily as needed #160 07/08/2018  30 minutes of face to face patient care time was spent during this visit. All  questions were encouraged and answered.  F/U in 6 months

## 2018-07-10 ENCOUNTER — Telehealth: Payer: Self-pay | Admitting: *Deleted

## 2018-07-10 NOTE — Telephone Encounter (Signed)
Mrs Northington's son Darol Destine called to let Zella Ball know that she only has 3-4 pills left in her bottle and he is requesting a refill. Eunice sent refill to the pharmacy on 07/08/18. I have left the message for Mr Kennon Rounds, and to call us if there is further problems.

## 2018-07-26 ENCOUNTER — Other Ambulatory Visit: Payer: Self-pay | Admitting: Nurse Practitioner

## 2018-07-26 DIAGNOSIS — I10 Essential (primary) hypertension: Secondary | ICD-10-CM

## 2018-08-08 ENCOUNTER — Other Ambulatory Visit: Payer: Self-pay | Admitting: Internal Medicine

## 2018-08-08 ENCOUNTER — Other Ambulatory Visit: Payer: Self-pay | Admitting: Physician Assistant

## 2018-08-08 ENCOUNTER — Other Ambulatory Visit: Payer: Self-pay | Admitting: Nurse Practitioner

## 2018-08-08 DIAGNOSIS — I10 Essential (primary) hypertension: Secondary | ICD-10-CM

## 2018-08-08 DIAGNOSIS — R32 Unspecified urinary incontinence: Secondary | ICD-10-CM

## 2018-09-02 ENCOUNTER — Ambulatory Visit (INDEPENDENT_AMBULATORY_CARE_PROVIDER_SITE_OTHER): Payer: Medicare Other | Admitting: Internal Medicine

## 2018-09-02 ENCOUNTER — Other Ambulatory Visit: Payer: Self-pay

## 2018-09-02 ENCOUNTER — Encounter: Payer: Self-pay | Admitting: Internal Medicine

## 2018-09-02 VITALS — BP 124/72 | HR 70 | Temp 97.7°F | Ht 63.4 in | Wt 142.0 lb

## 2018-09-02 DIAGNOSIS — I5032 Chronic diastolic (congestive) heart failure: Secondary | ICD-10-CM | POA: Diagnosis not present

## 2018-09-02 DIAGNOSIS — N183 Chronic kidney disease, stage 3 unspecified: Secondary | ICD-10-CM

## 2018-09-02 DIAGNOSIS — I130 Hypertensive heart and chronic kidney disease with heart failure and stage 1 through stage 4 chronic kidney disease, or unspecified chronic kidney disease: Secondary | ICD-10-CM | POA: Diagnosis not present

## 2018-09-02 DIAGNOSIS — F17200 Nicotine dependence, unspecified, uncomplicated: Secondary | ICD-10-CM | POA: Diagnosis not present

## 2018-09-02 DIAGNOSIS — M810 Age-related osteoporosis without current pathological fracture: Secondary | ICD-10-CM

## 2018-09-02 DIAGNOSIS — R413 Other amnesia: Secondary | ICD-10-CM

## 2018-09-02 DIAGNOSIS — Z122 Encounter for screening for malignant neoplasm of respiratory organs: Secondary | ICD-10-CM

## 2018-09-02 NOTE — Patient Instructions (Signed)
Smoking and Musculoskeletal Health Smoking is bad for your health. Most people know that smoking causes lung disease, heart disease, and cancer. But people may not realize that it also affects their bones, muscles, and joints (musculoskeletal system). When you smoke, the effects on your lungs and heart result in less oxygen for your musculoskeletal system. This can lead to poor bone and joint health. How can smoking affect my musculoskeletal health? Smoking can:  Increase your risk of having weak, thin bones (osteoporosis). Elderly smokers are at higher risk for bone fractures related to osteoporosis.  Decrease the ability of bone-forming cells to make and replace bone (in addition to reducing oxygen and blood flow).  Reduce your body's ability to absorb calcium from your diet. Less calcium means weaker bones.  Interfere with the breakdown of the female hormone estrogen. Smoking lowers estrogen, which is a hormone that helps keep bones strong. Women who smoke may have earlier menopause. Menopause is a risk factor for osteoporosis.  Weaken the tissues that attach bones to muscles (tendons). This can lead to shoulder, back, and other joint injuries.  Increase your risk of rheumatoid arthritis or make the condition worse if you already have it.  Slow down healing and increase your risk of infection and other complications if you have a bone fracture or surgery that involves your musculoskeletal system.  Make you get out of breath easily. This can keep you from getting the exercise you need to keep your bones and joints healthy.  Decrease your appetite and body mass. You may lose weight and muscle strength. This can put you at higher risk for muscle injury, joint injury, and broken bones. What actions can I take to prevent musculoskeletal problems? Quit smoking      Do not start smoking. Quit if you already do. Even stopping later in life can improve musculoskeletal health.  Do not use any  products that contain nicotine or tobacco. Do not replace cigarette smoking with e-cigarettes. The safety of e-cigarettes is not known, and some may contain harmful chemicals.  Make a plan to quit smoking and commit to it. Look for programs to help you, and ask your health care provider for recommendations and ideas.  Talk with your health care provider about using nicotine replacement medicines to help you quit, such as gum, lozenges, patches, sprays, or pills. Make other lifestyle changes   Eat a healthy diet that includes calcium and vitamin D. These nutrients are important for bone health. ? Calcium is found in dairy foods and green leafy vegetables. ? Vitamin D is found in eggs, fish, and liver. ? Many foods also have vitamin D and calcium added to them (are fortified). ? Ask your health care provider if you would benefit from taking a supplement.  Get out in the sunshine for a short time every day. This increases production of vitamin D.  Get 30 minutes of exercise at least 5 days a week. Weight-bearing and strength exercises are best for musculoskeletal health. Ask your health care provider what type of exercise is safe for you.  Do not drink alcohol if: ? Your health care provider tells you not to drink. ? You are pregnant, may be pregnant, or are planning to become pregnant.  If you drink alcohol, limit how much you have: ? 0-1 drink a day for women. ? 0-2 drinks a day for men.  Be aware of how much alcohol is in your drink. In the [place] one drink equals one 12 oz bottle  of beer (355 mL), one 5 oz glass of wine (148 mL), or one 1 oz glass of hard liquor (44 mL). Where to find more information You may find more information about smoking, musculoskeletal health, and quitting smoking from:  [place] [employer]: orthoinfo.aaos.[place], Osteoporosis and Related [place]: bones.SouthExposed.es  HelpGuide.org:  helpguide.org  https://hall.com/: smokefree.gov  American Lung Association: lung.org Contact a health care provider if:  You need help to quit smoking. Summary  When you smoke, the effects on your lungs and heart result in less oxygen for your musculoskeletal system.  Even stopping smoking later in life can improve musculoskeletal health.  Do not use any products that contain nicotine or tobacco, such as cigarettes and e-cigarettes.  If you need help quitting, ask your health care provider. This information is not intended to replace advice given to you by your health care provider. Make sure you discuss any questions you have with your health care provider. Document Released: 08/27/2017 Document Revised: 08/27/2017 Document Reviewed: 08/27/2017 Elsevier Interactive Patient Education  [employer].

## 2018-09-03 LAB — RPR: RPR Ser Ql: NONREACTIVE

## 2018-09-03 LAB — VITAMIN B12: Vitamin B-12: 307 pg/mL (ref 232–1245)

## 2018-09-03 LAB — TSH: TSH: 1.18 u[IU]/mL (ref 0.450–4.500)

## 2018-09-08 LAB — METHYLMALONIC ACID, SERUM: Methylmalonic Acid: 513 nmol/L — ABNORMAL HIGH (ref 0–378)

## 2018-09-08 LAB — SPECIMEN STATUS REPORT

## 2018-09-08 NOTE — Progress Notes (Addendum)
Subjective:     Patient ID: Sara Fox , female    DOB: Feb 25, 1941 , 78 y.o.   MRN: [id number]   Chief Complaint  Patient presents with  . Hypertension    HPI  Hypertension  This is a chronic problem. The current episode started more than 1 year ago. The problem has been gradually improving since onset. The problem is controlled. Pertinent negatives include no blurred vision, chest pain, palpitations or shortness of breath.     Past Medical History:  Diagnosis Date  . Acute kidney injury ([place])   . Back pain   . Cervical cancer ([place])   . CHF (congestive heart failure) ([place])   . COPD (chronic obstructive pulmonary disease) ([place])   . Coronary artery disease    s/p DES x2 to RCA 2016  . GERD (gastroesophageal reflux disease)   . Hyperlipidemia   . Hypertension   . Myocardial infarction ([place])   . Neck pain   . Rhabdomyolysis   . Stomach problems      Family History  Problem Relation Age of Onset  . Heart disease Mother        also HTN  . Diabetes Mother   . Colon cancer Mother   . Heart disease Brother        deceased at 63  . Hypertension Brother   . Heart attack Brother   . Heart disease Brother   . Hypertension Brother      Current Outpatient Medications:  .  aspirin EC 81 MG tablet, Take 81 mg by mouth daily., Disp: , Rfl:  .  budesonide-formoterol (SYMBICORT) 160-4.5 MCG/ACT inhaler, Inhale 2 puffs into the lungs 2 (two) times daily., Disp: , Rfl:  .  CALCIUM CITRATE PO, Take 1 tablet by mouth daily. , Disp: , Rfl:  .  Cholecalciferol (VITAMIN D3) 2000 UNITS capsule, Take 2,000 Units by mouth daily., Disp: , Rfl:  .  furosemide (LASIX) 20 MG tablet, Take 20 mg by mouth every other day., Disp: , Rfl: 1 .  gabapentin (NEURONTIN) 100 MG capsule, Take 100 mg by mouth 4 (four) times daily., Disp: , Rfl:  .  metoprolol tartrate (LOPRESSOR) 25 MG tablet, TAKE 1 TABLET BY MOUTH TWICE DAILY WITH  A  MEAL., Disp: 180 tablet, Rfl: 1 .  Multiple Vitamin (MULTIVITAMIN)  capsule, Take 1 capsule by mouth daily. Contains iron, Disp: , Rfl:  .  nitroGLYCERIN (NITROSTAT) 0.4 MG SL tablet, Place 1 tablet (0.4 mg total) under the tongue every 5 (five) minutes as needed for chest pain., Disp: 25 tablet, Rfl: 3 .  oxybutynin (DITROPAN) 5 MG tablet, Take 1/2 (one-half) tablet by mouth twice daily, Disp: 30 tablet, Rfl: 0 .  pantoprazole (PROTONIX) 40 MG tablet, Take 1 tablet by mouth twice daily, Disp: 180 tablet, Rfl: 1 .  polyethylene glycol (MIRALAX / GLYCOLAX) packet, Take 17 g by mouth daily as needed for mild constipation. , Disp: , Rfl:  .  Potassium Chloride ER 20 MEQ TBCR, TAKE 2 TABLETS BY MOUTH IN THE MORNING, Disp: 60 tablet, Rfl: 0 .  rosuvastatin (CRESTOR) 40 MG tablet, TAKE 1 TABLET BY MOUTH ONCE DAILY, Disp: 90 tablet, Rfl: 1 .  traMADol (ULTRAM) 50 MG tablet, Take 1-2  2 tablets by mouth three times daily, Disp: 160 tablet, Rfl: 0 .  vitamin C (ASCORBIC ACID) 500 MG tablet, Take 500 mg by mouth daily., Disp: , Rfl:    Allergies  Allergen Reactions  . Buprenorphine Hcl Shortness Of Breath  Throat swelling/trouble breathing and lethargic  . Morphine And Related Shortness Of Breath    Throat swelling/trouble breathing and lethargic  . Celebrex [Celecoxib] Diarrhea and Swelling    Swelling of legs   . Vioxx [Rofecoxib] Palpitations  . Codeine Other (See Comments)    Bloated      Review of Systems  Constitutional: Negative.   Eyes: Negative for blurred vision.  Respiratory: Negative.  Negative for shortness of breath.   Cardiovascular: Negative.  Negative for chest pain and palpitations.  Gastrointestinal: Negative.   Neurological: Negative.   Psychiatric/Behavioral: Negative.      Today's Vitals   09/02/18 1149  BP: 124/72  Pulse: 70  Temp: 97.7 F (36.5 C)  TempSrc: Oral  SpO2: 93%  Weight: 142 lb (64.4 kg)  Height: 5' 3.4" (1.61 m)  PainSc: 0-No pain   Body mass index is 24.84 kg/m.   Objective:  Physical Exam Vitals signs  and nursing note reviewed.  Constitutional:      Appearance: Normal appearance.  HENT:     Head: Normocephalic and atraumatic.  Cardiovascular:     Rate and Rhythm: Normal rate and regular rhythm.     Heart sounds: Normal heart sounds.  Pulmonary:     Effort: Pulmonary effort is normal.     Breath sounds: Normal breath sounds.     Comments: Decreased breath sounds b/l Skin:    General: Skin is warm.  Neurological:     General: No focal deficit present.     Mental Status: She is alert.  Psychiatric:        Mood and Affect: Mood normal.        Behavior: Behavior normal.         Assessment And Plan:     1. 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 She will continue with current meds. She is encouraged to avoid adding salt to her foods.   2. Tobacco use disorder  Chronic. Importance of smoking cessation was discussed with the patient for greater than 3 minutes. I will also refer her for low dose CT scan, lung CA screening.   3. Memory loss  I will check labs as listed below. I will address each lab as needed.  - Vitamin B12 - TSH - RPR  4. Age-related osteoporosis without current pathological fracture  Chronic. Last dexa scan results were reviewed in full detail. Importance of calcium and vitamin D supplementation was discussed with the patient.       6. Chronic diastolic heart failure, NYHA class 1 (HCC)  Chronic, yet stable. Importance of dietary compliance was discussed with the patient.   7. Stage 3 chronic kidney disease (HCC)  Chronic. Importance of adequate hydration was discussed with the patient.    Maximino Greenland, MD    THE PATIENT IS ENCOURAGED TO PRACTICE SOCIAL DISTANCING DUE TO THE COVID-19 PANDEMIC.

## 2018-09-10 ENCOUNTER — Other Ambulatory Visit: Payer: Self-pay | Admitting: Nurse Practitioner

## 2018-09-10 ENCOUNTER — Other Ambulatory Visit: Payer: Self-pay | Admitting: Physical Medicine & Rehabilitation

## 2018-09-10 ENCOUNTER — Telehealth: Payer: Self-pay

## 2018-09-10 DIAGNOSIS — R32 Unspecified urinary incontinence: Secondary | ICD-10-CM

## 2018-09-10 DIAGNOSIS — I10 Essential (primary) hypertension: Secondary | ICD-10-CM

## 2018-09-10 NOTE — Telephone Encounter (Signed)
Left a detailed message at pt's request. 

## 2018-09-10 NOTE — Telephone Encounter (Signed)
-----   Message from [name] Chard, MD sent at 09/08/2018  6:51 PM EDT ----- Her methylmalonic acid levels are elevated - this implies she may benefit from once weekly vit b12 injections x 4 weeks. pls schedule first injection for this week with me. This could also help with her memory. This may also help with her energy levels.

## 2018-09-10 NOTE — Telephone Encounter (Deleted)
-----   Message from [name] Chard, MD sent at 09/08/2018  6:51 PM EDT ----- Her methylmalonic acid levels are elevated - this implies she may benefit from once weekly vit b12 injections x 4 weeks. pls schedule first injection for this week with me. This could also help with her memory. This may also help with her energy levels.

## 2018-09-11 ENCOUNTER — Telehealth: Payer: Self-pay | Admitting: Registered Nurse

## 2018-09-11 MED ORDER — TRAMADOL HCL 50 MG PO TABS: ORAL_TABLET | ORAL | 0 refills | Status: DC

## 2018-09-11 NOTE — Telephone Encounter (Signed)
Clarification for Tramadol order, this provider decrease her tablets to 160 tablets. According to the PMP she received 180 tablets on 08/08/2018. Placed a call to [facility]  and spoke with pharmacist, he was instructed to remove the  Tramadol  Prescription with 180 tablets from her profile. We will continue the Tramadol 160 Tablets this month. We will continue with slow weaning with May Prescription. He verbalizes understanding.

## 2018-09-30 ENCOUNTER — Telehealth: Payer: Self-pay | Admitting: Internal Medicine

## 2018-10-01 ENCOUNTER — Encounter: Payer: Self-pay | Admitting: Internal Medicine

## 2018-10-01 ENCOUNTER — Telehealth (INDEPENDENT_AMBULATORY_CARE_PROVIDER_SITE_OTHER): Payer: Medicare Other | Admitting: Internal Medicine

## 2018-10-01 VITALS — BP 106/60 | HR 70 | Ht 63.4 in | Wt 138.6 lb

## 2018-10-01 DIAGNOSIS — Z7189 Other specified counseling: Secondary | ICD-10-CM

## 2018-10-01 DIAGNOSIS — E785 Hyperlipidemia, unspecified: Secondary | ICD-10-CM

## 2018-10-01 DIAGNOSIS — I5022 Chronic systolic (congestive) heart failure: Secondary | ICD-10-CM

## 2018-10-01 DIAGNOSIS — I255 Ischemic cardiomyopathy: Secondary | ICD-10-CM

## 2018-10-01 DIAGNOSIS — I2510 Atherosclerotic heart disease of native coronary artery without angina pectoris: Secondary | ICD-10-CM | POA: Diagnosis not present

## 2018-10-01 DIAGNOSIS — I10 Essential (primary) hypertension: Secondary | ICD-10-CM

## 2018-10-01 DIAGNOSIS — R7989 Other specified abnormal findings of blood chemistry: Secondary | ICD-10-CM

## 2018-10-01 DIAGNOSIS — I5032 Chronic diastolic (congestive) heart failure: Secondary | ICD-10-CM

## 2018-10-01 DIAGNOSIS — R945 Abnormal results of liver function studies: Secondary | ICD-10-CM

## 2018-10-01 MED ORDER — POTASSIUM CHLORIDE ER 20 MEQ PO TBCR: EXTENDED_RELEASE_TABLET | ORAL | 11 refills | Status: DC

## 2018-10-01 MED ORDER — ROSUVASTATIN CALCIUM 40 MG PO TABS: 40.0000 mg | ORAL_TABLET | Freq: Every day | ORAL | 11 refills | Status: DC

## 2018-10-01 NOTE — Patient Instructions (Addendum)
Medication Instructions:  RESUME crestor daily   If you need a refill on your cardiac medications before your next appointment, please call your pharmacy.   Lab work: FASTING lab work in 3 months to check cholesterol & liver function If you have labs (blood work) drawn today and your tests are completely normal, you will receive your results only by: Marland Kitchen MyChart Message (if you have MyChart) OR . A paper copy in the mail If you have any lab test that is abnormal or we need to change your treatment, we will call you to review the results.  Testing/Procedures: NONE  Follow-Up: At [facility], you and your health needs are our priority.  As part of our continuing mission to provide you with exceptional heart care, we have created designated Provider Care Teams.  These Care Teams include your primary Cardiologist (physician) and Advanced Practice Providers (APPs -  Physician Assistants and Nurse Practitioners) who all work together to provide you with the care you need, when you need it. You will need a follow up appointment in 6 months.  Please call our office 2 months in advance to schedule this appointment.  You may see Pixie Casino, MD or one of the following Advanced Practice Providers on your designated Care Team: [place], [place] . Fabian Sharp, PA-C  Any Other Special Instructions Will Be Listed Below (If Applicable).

## 2018-10-01 NOTE — Progress Notes (Signed)
Virtual Visit via Telephone Note   This visit type was conducted due to national recommendations for restrictions regarding the COVID-19 Pandemic (e.g. social distancing) in an effort to limit this patient's exposure and mitigate transmission in our community.  Due to her co-morbid illnesses, this patient is at least at moderate risk for complications without adequate follow up.  This format is felt to be most appropriate for this patient at this time.  The patient did not have access to video technology/had technical difficulties with video requiring transitioning to audio format only (telephone).  All issues noted in this document were discussed and addressed.  No physical exam could be performed with this format.  Please refer to the patient's chart for her  consent to telehealth for [facility].   Evaluation Performed:  Telephone follow-up  Date:  10/01/2018   ID:  Sara Fox, DOB [birth year], MRN [id number]  Patient Location:  [place] [address]  Provider location:   [address], [place] [address], [place] [address]  PCP:  [name], FNP  Cardiologist:  Pixie Casino, MD Electrophysiologist:  None   Chief Complaint:  No complaints  History of Present Illness:    Sara Fox is a 78 y.o. female who presents via audio/video conferencing for a telehealth visit today.  Sara Fox was seen today for telephone follow-up.  She has a history of coronary disease with 2 drug-eluting stents to the RCA in 2016.  She also has chronic systolic congestive heart failure, COPD, hypertension, dyslipidemia and other medical problems.  Overall she reports she is feeling well without any chest pain or worsening shortness of breath.  She is recently seen her primary care providers for a number of different issues.  For one reason or another her rosuvastatin was discontinued early in the year.  I suspect it might have been due to elevated liver enzymes although they were  normal on her last labs.  Despite this her PCP note indicates that she was on a statin in January and her cholesterol was being managed although it is much higher than goal LDL less than 70.  It is not clear whether she was taking the rosuvastatin at that time however her son who gives her her medication says that she is not been on it it has been set aside.  The patient does not have symptoms concerning for COVID-19 infection (fever, chills, cough, or new SHORTNESS OF BREATH).    Prior CV studies:   The following studies were reviewed today:  Chart review  PMHx:  Past Medical History:  Diagnosis Date  . Acute kidney injury ([place])   . Back pain   . Cervical cancer ([place])   . CHF (congestive heart failure) ([place])   . COPD (chronic obstructive pulmonary disease) ([place])   . Coronary artery disease    s/p DES x2 to RCA 2016  . GERD (gastroesophageal reflux disease)   . Hyperlipidemia   . Hypertension   . Myocardial infarction ([place])   . Neck pain   . Rhabdomyolysis   . Stomach problems     Past Surgical History:  Procedure Laterality Date  . ABDOMINAL HYSTERECTOMY    . BACK SURGERY  2012   lower back  . CARDIAC CATHETERIZATION  06/1999   noncritical disease invovling PDA  . CARDIAC CATHETERIZATION N/A 09/14/2014   Procedure: Left Heart Cath and Coronary Angiography;  Surgeon: Leonie Man, MD;  Location: [facility] INVASIVE CV LAB CUPID;  Service:  Cardiovascular;  Laterality: N/A;  . CHOLECYSTECTOMY    . ESOPHAGOGASTRODUODENOSCOPY (EGD) WITH PROPOFOL Left 07/19/2017   Procedure: ESOPHAGOGASTRODUODENOSCOPY (EGD) WITH PROPOFOL;  Surgeon: Ronnette Juniper, MD;  Location: [facility] ENDOSCOPY;  Service: Gastroenterology;  Laterality: Left;  . FRACTURE SURGERY Left 05/2017   left wrist  . HARDWARE REMOVAL Left 11/07/2017   Procedure: LEFT WRIST HARDWARE REMOVAL;  Surgeon: Charlotte Crumb, MD;  Location: [facility];  Service: Orthopedics;  Laterality: Left;  . KNEE SURGERY Bilateral 2001 & 2007  . NECK SURGERY   2012   2012  . PARTIAL GASTRECTOMY  2005   subtotal  . PERCUTANEOUS CORONARY STENT INTERVENTION (PCI-S)  09/14/2014   Procedure: Percutaneous Coronary Stent Intervention (Pci-S);  Surgeon: Leonie Man, MD;  Location: [facility] INVASIVE CV LAB CUPID;  Service: Cardiovascular;;  . SHOULDER SURGERY Right   . TRANSTHORACIC ECHOCARDIOGRAM  06/02/2010   EF=>55%, normal LV systolic function; normal RV systolic function; mild mitral annular calcif; trace TR; AV mildly sclerotic  . WRIST OSTEOTOMY Left 11/07/2017   Procedure: LEFT WRIST DISTAL ULNA RESECTION WITH EXTENSOR CARPI ULNARIS STABILIZATION;  Surgeon: Charlotte Crumb, MD;  Location: [facility];  Service: Orthopedics;  Laterality: Left;    FAMHx:  Family History  Problem Relation Age of Onset  . Heart disease Mother        also HTN  . Diabetes Mother   . Colon cancer Mother   . Heart disease Brother        deceased at 17  . Hypertension Brother   . Heart attack Brother   . Heart disease Brother   . Hypertension Brother     SOCHx:   reports that she has been smoking. She has a 60.00 pack-year smoking history. She has never used smokeless tobacco. She reports that she does not drink alcohol or use drugs.  ALLERGIES:  Allergies  Allergen Reactions  . Buprenorphine Hcl Shortness Of Breath    Throat swelling/trouble breathing and lethargic  . Morphine And Related Shortness Of Breath    Throat swelling/trouble breathing and lethargic  . Celebrex [Celecoxib] Diarrhea and Swelling    Swelling of legs   . Vioxx [Rofecoxib] Palpitations  . Codeine Other (See Comments)    Bloated     MEDS:  Current Meds  Medication Sig  . aspirin EC 81 MG tablet Take 81 mg by mouth daily.  . budesonide-formoterol (SYMBICORT) 160-4.5 MCG/ACT inhaler Inhale 2 puffs into the lungs 2 (two) times daily.  Marland Kitchen CALCIUM CITRATE PO Take 1 tablet by mouth daily.   . Cholecalciferol (VITAMIN D3) 2000 UNITS capsule Take 2,000 Units by mouth daily.  . furosemide  (LASIX) 20 MG tablet Take 20 mg by mouth every other day.  . gabapentin (NEURONTIN) 100 MG capsule Take 100 mg by mouth 4 (four) times daily.  . metoprolol tartrate (LOPRESSOR) 25 MG tablet TAKE 1 TABLET BY MOUTH TWICE DAILY WITH  A  MEAL.  . Multiple Vitamin (MULTIVITAMIN) capsule Take 1 capsule by mouth daily. Contains iron  . nitroGLYCERIN (NITROSTAT) 0.4 MG SL tablet Place 1 tablet (0.4 mg total) under the tongue every 5 (five) minutes as needed for chest pain.  Marland Kitchen oxybutynin (DITROPAN) 5 MG tablet Take 1/2 (one-half) tablet by mouth twice daily  . pantoprazole (PROTONIX) 40 MG tablet Take 1 tablet by mouth twice daily  . polyethylene glycol (MIRALAX / GLYCOLAX) packet Take 17 g by mouth daily as needed for mild constipation.   . Potassium Chloride ER 20 MEQ TBCR TAKE 2 TABLETS  BY MOUTH IN THE MORNING  . traMADol (ULTRAM) 50 MG tablet TAKE 1-2 TABLETS BY MOUTH THREE TIMES DAILY. Do Not Fill this prescription until [facility] for refill. Do Not Fill Before 10/08/2018  . vitamin C (ASCORBIC ACID) 500 MG tablet Take 500 mg by mouth daily.  . [DISCONTINUED] Potassium Chloride ER 20 MEQ TBCR TAKE 2 TABLETS BY MOUTH IN THE MORNING     ROS: Pertinent items noted in HPI and remainder of comprehensive ROS otherwise negative.  Labs/Other Tests and Data Reviewed:    Recent Labs: 03/12/2018: ALT 9; BUN 19; Creatinine, Ser 1.09; Potassium 4.7; Sodium 139 06/03/2018: Hemoglobin 10.1; Platelets 340 09/02/2018: TSH 1.180   Recent Lipid Panel Lab Results  Component Value Date/Time   CHOL 223 (H) 06/03/2018 04:23 PM   TRIG 124 06/03/2018 04:23 PM   HDL 57 06/03/2018 04:23 PM   CHOLHDL 3.9 06/03/2018 04:23 PM   CHOLHDL 3.5 09/12/2014 10:41 AM   LDLCALC 141 (H) 06/03/2018 04:23 PM    Wt Readings from Last 3 Encounters:  10/01/18 138 lb 9.6 oz (62.9 kg)  09/02/18 142 lb (64.4 kg)  07/08/18 145 lb (65.8 kg)     Exam:    Vital Signs:  BP 106/60   Pulse 70   Ht 5' 3.4" (1.61 m)   Wt 138  lb 9.6 oz (62.9 kg)   BMI 24.24 kg/m    No exam due to telephone visit  ASSESSMENT & PLAN:    1. Coronary artery disease status post PCI to the RCA with a 3.0 x 33 mm Xience Alpine drug-eluting stent (09/2014) 2. Hypertension-controlled 3. Dyslipidemia -not at goal LDL <70  Sara Fox continues to do well without any recurrent chest pain.  She did have some chest pain last summer and underwent a nuclear stress test which showed no ischemia and EF 48%, however an echo just prior to that showed normal systolic function.  She denies any worsening shortness of breath.  Her lipids are not well controlled.  LDL remains elevated.  Apparently she has been off of her rosuvastatin as her son who arranges her medications noted that it was taken out.  This might of been for elevated liver enzymes although her last liver studies were normal.  There is a listing for elevated LFTs in her chart.  I would like her to resume rosuvastatin 40 mg daily.  We will repeat a lipid profile in 3 months as well as liver enzymes.  COVID-19 Education: The signs and symptoms of COVID-19 were discussed with the patient and how to seek care for testing (follow up with PCP or arrange E-visit).  The importance of social distancing was discussed today.  Patient Risk:   After full review of this patients clinical status, I feel that they are at least moderate risk at this time.  Time:   Today, I have spent 25 minutes with the patient with telehealth technology discussing dyslipidemia, coronary disease, hypertension.     Medication Adjustments/Labs and Tests Ordered: Current medicines are reviewed at length with the patient today.  Concerns regarding medicines are outlined above.   Tests Ordered: Orders Placed This Encounter  Procedures  . Lipid panel  . Hepatic function panel    Medication Changes: Meds ordered this encounter  Medications  . rosuvastatin (CRESTOR) 40 MG tablet    Sig: Take 1 tablet (40 mg total) by  mouth daily.    Dispense:  30 tablet    Refill:  11  . Potassium  Chloride ER 20 MEQ TBCR    Sig: TAKE 2 TABLETS BY MOUTH IN THE MORNING    Dispense:  60 tablet    Refill:  11    Disposition:  in 6 month(s)  Pixie Casino, MD, [facility], [place] Director of the Advanced Lipid Disorders &  Cardiovascular Risk Reduction Clinic Diplomate of the American Board of Clinical Lipidology Attending Cardiologist  Direct Dial: [phone]  Fax: [phone]  Website:  [email or web address]  Pixie Casino, MD  10/01/2018 5:12 PM

## 2018-10-14 ENCOUNTER — Telehealth: Payer: Self-pay | Admitting: Internal Medicine

## 2018-10-14 NOTE — Telephone Encounter (Signed)
Per last visit with MD, patient was to resume crestor. In July 2019, she was on crestor 40mg  daily - this was the dose refilled at last cardiology visit.   If dose is in question, another provider may have changed this.

## 2018-10-14 NOTE — Telephone Encounter (Signed)
Spoke with patient and son. Notified them that MD recommended she resume crestor 40mg  daily. They are also asking about gabapentin dose. PCP increased this. Advised to contact PCP about concerns, if they think dose or frequency is too much. They voiced understanding.

## 2018-10-14 NOTE — Telephone Encounter (Signed)
New Message    Pt c/o medication issue:  1. Name of Medication: rosuvastatin   2. How are you currently taking this medication (dosage and times per day)? 40mg  1 x at bedtime   3. Are you having a reaction (difficulty breathing--STAT)? No   4. What is your medication issue? Pts son is calling and is wondering if the dose has been increased.

## 2018-10-16 ENCOUNTER — Other Ambulatory Visit: Payer: Self-pay

## 2018-10-16 ENCOUNTER — Encounter: Payer: Self-pay | Admitting: Internal Medicine

## 2018-10-16 ENCOUNTER — Ambulatory Visit (INDEPENDENT_AMBULATORY_CARE_PROVIDER_SITE_OTHER): Payer: Medicare Other | Admitting: Internal Medicine

## 2018-10-16 VITALS — BP 116/58 | HR 72 | Temp 98.7°F | Ht 63.4 in | Wt 140.0 lb

## 2018-10-16 DIAGNOSIS — M545 Low back pain, unspecified: Secondary | ICD-10-CM

## 2018-10-16 DIAGNOSIS — G8929 Other chronic pain: Secondary | ICD-10-CM

## 2018-10-16 DIAGNOSIS — D519 Vitamin B12 deficiency anemia, unspecified: Secondary | ICD-10-CM

## 2018-10-16 MED ORDER — CYANOCOBALAMIN 1000 MCG/ML IJ SOLN
1000.0000 ug | Freq: Once | INTRAMUSCULAR | Status: AC
  Administered 2018-10-16: 1000 ug via INTRAMUSCULAR

## 2018-10-16 NOTE — Patient Instructions (Signed)
Vitamin B12 Deficiency Vitamin B12 deficiency means that your body is not getting enough vitamin B12. Your body needs vitamin B12 for important bodily functions. If you do not have enough vitamin B12 in your body, you can have health problems. Follow these instructions at home:  Take supplements only as told by your doctor. Follow the directions carefully.  Get any shots (injections) as told by your doctor. Do not miss your visits to the doctor.  Eat lots of healthy foods that contain vitamin B12. Ask your doctor if you should work with someone who is trained in how food affects health (dietitian). Foods that contain vitamin B12 include: ? Meat. ? Meat from birds (poultry). ? Fish. ? Eggs. ? Cereal and dairy products that are fortified. This means that vitamin B12 has been added to the food. Check the label on the package to see if the food is fortified.  Do not drink too much (do not abuse) alcohol.  Keep all follow-up visits as told by your doctor. This is important. Contact a doctor if:  Your symptoms come back. Get help right away if:  You have trouble breathing.  You have chest pain.  You get dizzy.  You pass out (lose consciousness). This information is not intended to replace advice given to you by your health care provider. Make sure you discuss any questions you have with your health care provider. Document Released: 04/20/2011 Document Revised: 10/07/2015 Document Reviewed: 09/16/2014 Elsevier Interactive Patient Education  2019 [employer].

## 2018-10-17 ENCOUNTER — Ambulatory Visit: Payer: Medicare Other

## 2018-10-17 ENCOUNTER — Other Ambulatory Visit: Payer: Self-pay

## 2018-10-18 ENCOUNTER — Other Ambulatory Visit: Payer: Self-pay | Admitting: Nurse Practitioner

## 2018-10-18 DIAGNOSIS — R32 Unspecified urinary incontinence: Secondary | ICD-10-CM

## 2018-10-20 NOTE — Progress Notes (Signed)
Subjective:     Patient ID: Sara Fox , female    DOB: Dec 28, 1940 , 78 y.o.   MRN: [id number]   Chief Complaint  Patient presents with  . B12 Injection    1st weekly of 4    HPI  She is here today for vit b12 injection. This is her first in a series of four weekly injections.     Past Medical History:  Diagnosis Date  . Acute kidney injury ([place])   . Back pain   . Cervical cancer ([place])   . CHF (congestive heart failure) ([place])   . COPD (chronic obstructive pulmonary disease) ([place])   . Coronary artery disease    s/p DES x2 to RCA 2016  . GERD (gastroesophageal reflux disease)   . Hyperlipidemia   . Hypertension   . Myocardial infarction ([place])   . Neck pain   . Rhabdomyolysis   . Stomach problems      Family History  Problem Relation Age of Onset  . Heart disease Mother        also HTN  . Diabetes Mother   . Colon cancer Mother   . Heart disease Brother        deceased at 84  . Hypertension Brother   . Heart attack Brother   . Heart disease Brother   . Hypertension Brother      Current Outpatient Medications:  .  aspirin EC 81 MG tablet, Take 81 mg by mouth daily., Disp: , Rfl:  .  budesonide-formoterol (SYMBICORT) 160-4.5 MCG/ACT inhaler, Inhale 2 puffs into the lungs 2 (two) times daily., Disp: , Rfl:  .  CALCIUM CITRATE PO, Take 1 tablet by mouth daily. , Disp: , Rfl:  .  Cholecalciferol (VITAMIN D3) 2000 UNITS capsule, Take 2,000 Units by mouth daily., Disp: , Rfl:  .  furosemide (LASIX) 20 MG tablet, Take 20 mg by mouth every other day., Disp: , Rfl: 1 .  gabapentin (NEURONTIN) 100 MG capsule, Take 100 mg by mouth daily. , Disp: , Rfl:  .  metoprolol tartrate (LOPRESSOR) 25 MG tablet, TAKE 1 TABLET BY MOUTH TWICE DAILY WITH  A  MEAL., Disp: 180 tablet, Rfl: 1 .  Multiple Vitamin (MULTIVITAMIN) capsule, Take 1 capsule by mouth daily. Contains iron, Disp: , Rfl:  .  nitroGLYCERIN (NITROSTAT) 0.4 MG SL tablet, Place 1 tablet (0.4 mg total) under the tongue  every 5 (five) minutes as needed for chest pain., Disp: 25 tablet, Rfl: 3 .  pantoprazole (PROTONIX) 40 MG tablet, Take 1 tablet by mouth twice daily, Disp: 180 tablet, Rfl: 1 .  polyethylene glycol (MIRALAX / GLYCOLAX) packet, Take 17 g by mouth daily as needed for mild constipation. , Disp: , Rfl:  .  Potassium Chloride ER 20 MEQ TBCR, TAKE 2 TABLETS BY MOUTH IN THE MORNING, Disp: 60 tablet, Rfl: 11 .  rosuvastatin (CRESTOR) 40 MG tablet, Take 1 tablet (40 mg total) by mouth daily., Disp: 30 tablet, Rfl: 11 .  traMADol (ULTRAM) 50 MG tablet, TAKE 1-2 TABLETS BY MOUTH THREE TIMES DAILY. Do Not Fill this prescription until [facility] for refill. Do Not Fill Before 10/08/2018, Disp: 140 tablet, Rfl: 0 .  vitamin C (ASCORBIC ACID) 500 MG tablet, Take 500 mg by mouth daily., Disp: , Rfl:  .  oxybutynin (DITROPAN) 5 MG tablet, Take 1/2 (one-half) tablet by mouth twice daily, Disp: 30 tablet, Rfl: 0   Allergies  Allergen Reactions  . Buprenorphine Hcl Shortness Of Breath  Throat swelling/trouble breathing and lethargic  . Morphine And Related Shortness Of Breath    Throat swelling/trouble breathing and lethargic  . Celebrex [Celecoxib] Diarrhea and Swelling    Swelling of legs   . Vioxx [Rofecoxib] Palpitations  . Codeine Other (See Comments)    Bloated      Review of Systems  Constitutional: Negative.   Respiratory: Negative.   Cardiovascular: Negative.   Gastrointestinal: Negative.   Neurological: Negative.   Psychiatric/Behavioral: Negative.      Today's Vitals   10/16/18 1027  BP: (!) 116/58  Pulse: 72  Temp: 98.7 F (37.1 C)  TempSrc: Oral  Weight: 140 lb (63.5 kg)  Height: 5' 3.4" (1.61 m)  PainSc: 0-No pain   Body mass index is 24.49 kg/m.   Objective:  Physical Exam Vitals signs and nursing note reviewed.  Constitutional:      Appearance: Normal appearance.  HENT:     Head: Normocephalic and atraumatic.  Cardiovascular:     Rate and Rhythm: Normal rate  and regular rhythm.     Heart sounds: Normal heart sounds.  Pulmonary:     Effort: Pulmonary effort is normal.     Breath sounds: Normal breath sounds.  Skin:    General: Skin is warm.  Neurological:     General: No focal deficit present.     Mental Status: She is alert.  Psychiatric:        Mood and Affect: Mood normal.        Behavior: Behavior normal.         Assessment And Plan:     1. Anemia due to vitamin B12 deficiency, unspecified B12 deficiency type  She was given vit b12 IM x 1. She will rto weekly x 3.   - cyanocobalamin ((VITAMIN B-12)) injection 1,000 mcg  2. Chronic bilateral low back pain without sciatica  Chronic. Medication list was reviewed. She was advised to only take nightly since it can be sedating. She and her son are in agreement with her treatment plan.   Maximino Greenland, MD    THE PATIENT IS ENCOURAGED TO PRACTICE SOCIAL DISTANCING DUE TO THE COVID-19 PANDEMIC.

## 2018-10-23 ENCOUNTER — Encounter: Payer: Self-pay | Admitting: Nurse Practitioner

## 2018-10-23 ENCOUNTER — Other Ambulatory Visit: Payer: Self-pay

## 2018-10-23 ENCOUNTER — Ambulatory Visit (INDEPENDENT_AMBULATORY_CARE_PROVIDER_SITE_OTHER): Payer: Medicare Other | Admitting: Nurse Practitioner

## 2018-10-23 VITALS — BP 90/50 | HR 80 | Temp 98.3°F | Ht 63.4 in | Wt 138.2 lb

## 2018-10-23 DIAGNOSIS — I959 Hypotension, unspecified: Secondary | ICD-10-CM | POA: Diagnosis not present

## 2018-10-23 DIAGNOSIS — E538 Deficiency of other specified B group vitamins: Secondary | ICD-10-CM

## 2018-10-23 MED ORDER — CYANOCOBALAMIN 1000 MCG/ML IJ SOLN
1000.0000 ug | Freq: Once | INTRAMUSCULAR | Status: AC
  Administered 2018-10-23: 1000 ug via INTRAMUSCULAR

## 2018-10-23 NOTE — Patient Instructions (Signed)
Take lasix on Sunday, Tuesday and Thursdays - continue to monitor her blood pressure and log then bring to next appt.

## 2018-10-23 NOTE — Progress Notes (Signed)
Subjective:     Patient ID: Sara Fox , female    DOB: November 17, 1940 , 78 y.o.   MRN: [id number]   Chief Complaint  Patient presents with  . B12 Injection    patientp presents today for her 2nd B12 injection    HPI  Son reports her blood pressure was fine yesterday.  She is here today for her 2nd vitamin B12 injection.  She feels the after the first injection she had a little more energy.  Her son reports she has a poor appetite.      Past Medical History:  Diagnosis Date  . Acute kidney injury ([place])   . Back pain   . Cervical cancer ([place])   . CHF (congestive heart failure) ([place])   . COPD (chronic obstructive pulmonary disease) ([place])   . Coronary artery disease    s/p DES x2 to RCA 2016  . GERD (gastroesophageal reflux disease)   . Hyperlipidemia   . Hypertension   . Myocardial infarction ([place])   . Neck pain   . Rhabdomyolysis   . Stomach problems      Family History  Problem Relation Age of Onset  . Heart disease Mother        also HTN  . Diabetes Mother   . Colon cancer Mother   . Heart disease Brother        deceased at 101  . Hypertension Brother   . Heart attack Brother   . Heart disease Brother   . Hypertension Brother      Current Outpatient Medications:  .  aspirin EC 81 MG tablet, Take 81 mg by mouth daily., Disp: , Rfl:  .  budesonide-formoterol (SYMBICORT) 160-4.5 MCG/ACT inhaler, Inhale 2 puffs into the lungs 2 (two) times daily., Disp: , Rfl:  .  CALCIUM CITRATE PO, Take 1 tablet by mouth daily. , Disp: , Rfl:  .  Cholecalciferol (VITAMIN D3) 2000 UNITS capsule, Take 2,000 Units by mouth daily., Disp: , Rfl:  .  furosemide (LASIX) 20 MG tablet, Take 20 mg by mouth every other day., Disp: , Rfl: 1 .  gabapentin (NEURONTIN) 100 MG capsule, Take 100 mg by mouth daily. , Disp: , Rfl:  .  metoprolol tartrate (LOPRESSOR) 25 MG tablet, TAKE 1 TABLET BY MOUTH TWICE DAILY WITH  A  MEAL., Disp: 180 tablet, Rfl: 1 .  Multiple Vitamin (MULTIVITAMIN) capsule,  Take 1 capsule by mouth daily. Contains iron, Disp: , Rfl:  .  nitroGLYCERIN (NITROSTAT) 0.4 MG SL tablet, Place 1 tablet (0.4 mg total) under the tongue every 5 (five) minutes as needed for chest pain., Disp: 25 tablet, Rfl: 3 .  oxybutynin (DITROPAN) 5 MG tablet, Take 1/2 (one-half) tablet by mouth twice daily, Disp: 30 tablet, Rfl: 0 .  pantoprazole (PROTONIX) 40 MG tablet, Take 1 tablet by mouth twice daily, Disp: 180 tablet, Rfl: 1 .  polyethylene glycol (MIRALAX / GLYCOLAX) packet, Take 17 g by mouth daily as needed for mild constipation. , Disp: , Rfl:  .  Potassium Chloride ER 20 MEQ TBCR, TAKE 2 TABLETS BY MOUTH IN THE MORNING, Disp: 60 tablet, Rfl: 11 .  rosuvastatin (CRESTOR) 40 MG tablet, Take 1 tablet (40 mg total) by mouth daily., Disp: 30 tablet, Rfl: 11 .  traMADol (ULTRAM) 50 MG tablet, TAKE 1-2 TABLETS BY MOUTH THREE TIMES DAILY. Do Not Fill this prescription until [facility] for refill. Do Not Fill Before 10/08/2018, Disp: 140 tablet, Rfl: 0 .  vitamin C (  ASCORBIC ACID) 500 MG tablet, Take 500 mg by mouth daily., Disp: , Rfl:    Allergies  Allergen Reactions  . Buprenorphine Hcl Shortness Of Breath    Throat swelling/trouble breathing and lethargic  . Morphine And Related Shortness Of Breath    Throat swelling/trouble breathing and lethargic  . Celebrex [Celecoxib] Diarrhea and Swelling    Swelling of legs   . Vioxx [Rofecoxib] Palpitations  . Codeine Other (See Comments)    Bloated      Review of Systems  Constitutional: Negative.   Respiratory: Negative.   Cardiovascular: Negative.  Negative for chest pain, palpitations and leg swelling.  Endocrine: Negative for polydipsia, polyphagia and polyuria.  Neurological: Negative for dizziness and headaches.     Today's Vitals   10/23/18 0921 10/23/18 0937 10/23/18 0938 10/23/18 0939  BP: (!) 90/50     Pulse: 80     Temp: 98.3 F (36.8 C)     TempSrc: Oral     SpO2:  90% 98% 96%  Weight: 138 lb 3.2 oz (62.7  kg)     Height: 5' 3.4" (1.61 m)     PainSc: 0-No pain      Body mass index is 24.17 kg/m.   Objective:  Physical Exam Vitals signs reviewed.  Constitutional:      Appearance: Normal appearance.  Cardiovascular:     Rate and Rhythm: Normal rate and regular rhythm.     Pulses: Normal pulses.     Heart sounds: Normal heart sounds. No murmur.  Pulmonary:     Effort: Pulmonary effort is normal. No respiratory distress.     Breath sounds: Normal breath sounds.  Skin:    General: Skin is warm and dry.     Capillary Refill: Capillary refill takes less than 2 seconds.     Comments: Skin turgor is decreased  Neurological:     General: No focal deficit present.     Mental Status: She is alert and oriented to person, place, and time.  Psychiatric:        Mood and Affect: Mood normal.        Behavior: Behavior normal.        Thought Content: Thought content normal.        Judgment: Judgment normal.         Assessment And Plan:     1. Vitamin B12 deficiency  2nd vitamin B12 injection given today  Return in 1 week for 3rd dose - cyanocobalamin ((VITAMIN B-12)) injection 1,000 mcg  2. Hypotension, unspecified hypotension type  Repeat blood pressure was 100/50, this is improved from initial blood pressure  I did advise her to only take her furosemide on Sun, Tues and Thur  Her son is to keep a blood pressure log and bring next Wednesday.     [name], FNP    THE PATIENT IS ENCOURAGED TO PRACTICE SOCIAL DISTANCING DUE TO THE COVID-19 PANDEMIC.

## 2018-10-30 ENCOUNTER — Encounter: Payer: Self-pay | Admitting: Nurse Practitioner

## 2018-10-30 ENCOUNTER — Other Ambulatory Visit: Payer: Self-pay

## 2018-10-30 ENCOUNTER — Ambulatory Visit (INDEPENDENT_AMBULATORY_CARE_PROVIDER_SITE_OTHER): Payer: Medicare Other | Admitting: Nurse Practitioner

## 2018-10-30 VITALS — BP 110/60 | HR 61 | Temp 97.8°F | Wt 138.0 lb

## 2018-10-30 DIAGNOSIS — E538 Deficiency of other specified B group vitamins: Secondary | ICD-10-CM | POA: Diagnosis not present

## 2018-10-30 MED ORDER — CYANOCOBALAMIN 1000 MCG/ML IJ SOLN
1000.0000 ug | Freq: Once | INTRAMUSCULAR | Status: AC
  Administered 2018-10-30: 1000 ug via INTRAMUSCULAR

## 2018-10-30 NOTE — Progress Notes (Signed)
Subjective:     Patient ID: Sara Fox , female    DOB: 25-Sep-1940 , 78 y.o.   MRN: [id number]   Chief Complaint  Patient presents with  . B12 Injection    patient presents today for her 3rd B12 injection    HPI  She is here today for her 3rd vitamin B12 injection.  Tolerating well.  She is here today with her son.    Past Medical History:  Diagnosis Date  . Acute kidney injury ([place])   . Back pain   . Cervical cancer ([place])   . CHF (congestive heart failure) ([place])   . COPD (chronic obstructive pulmonary disease) ([place])   . Coronary artery disease    s/p DES x2 to RCA 2016  . GERD (gastroesophageal reflux disease)   . Hyperlipidemia   . Hypertension   . Myocardial infarction ([place])   . Neck pain   . Rhabdomyolysis   . Stomach problems      Family History  Problem Relation Age of Onset  . Heart disease Mother        also HTN  . Diabetes Mother   . Colon cancer Mother   . Heart disease Brother        deceased at 47  . Hypertension Brother   . Heart attack Brother   . Heart disease Brother   . Hypertension Brother      Current Outpatient Medications:  .  aspirin EC 81 MG tablet, Take 81 mg by mouth daily., Disp: , Rfl:  .  budesonide-formoterol (SYMBICORT) 160-4.5 MCG/ACT inhaler, Inhale 2 puffs into the lungs 2 (two) times daily., Disp: , Rfl:  .  CALCIUM CITRATE PO, Take 1 tablet by mouth daily. , Disp: , Rfl:  .  Cholecalciferol (VITAMIN D3) 2000 UNITS capsule, Take 2,000 Units by mouth daily., Disp: , Rfl:  .  furosemide (LASIX) 20 MG tablet, Take 20 mg by mouth every other day., Disp: , Rfl: 1 .  gabapentin (NEURONTIN) 100 MG capsule, Take 100 mg by mouth daily. , Disp: , Rfl:  .  metoprolol tartrate (LOPRESSOR) 25 MG tablet, TAKE 1 TABLET BY MOUTH TWICE DAILY WITH  A  MEAL., Disp: 180 tablet, Rfl: 1 .  Multiple Vitamin (MULTIVITAMIN) capsule, Take 1 capsule by mouth daily. Contains iron, Disp: , Rfl:  .  nitroGLYCERIN (NITROSTAT) 0.4 MG SL tablet, Place 1  tablet (0.4 mg total) under the tongue every 5 (five) minutes as needed for chest pain., Disp: 25 tablet, Rfl: 3 .  oxybutynin (DITROPAN) 5 MG tablet, Take 1/2 (one-half) tablet by mouth twice daily, Disp: 30 tablet, Rfl: 0 .  pantoprazole (PROTONIX) 40 MG tablet, Take 1 tablet by mouth twice daily, Disp: 180 tablet, Rfl: 1 .  polyethylene glycol (MIRALAX / GLYCOLAX) packet, Take 17 g by mouth daily as needed for mild constipation. , Disp: , Rfl:  .  Potassium Chloride ER 20 MEQ TBCR, TAKE 2 TABLETS BY MOUTH IN THE MORNING, Disp: 60 tablet, Rfl: 11 .  rosuvastatin (CRESTOR) 40 MG tablet, Take 1 tablet (40 mg total) by mouth daily., Disp: 30 tablet, Rfl: 11 .  traMADol (ULTRAM) 50 MG tablet, TAKE 1-2 TABLETS BY MOUTH THREE TIMES DAILY. Do Not Fill this prescription until [facility] for refill. Do Not Fill Before 10/08/2018, Disp: 140 tablet, Rfl: 0 .  vitamin C (ASCORBIC ACID) 500 MG tablet, Take 500 mg by mouth daily., Disp: , Rfl:    Allergies  Allergen Reactions  .  Buprenorphine Hcl Shortness Of Breath    Throat swelling/trouble breathing and lethargic  . Morphine And Related Shortness Of Breath    Throat swelling/trouble breathing and lethargic  . Celebrex [Celecoxib] Diarrhea and Swelling    Swelling of legs   . Vioxx [Rofecoxib] Palpitations  . Codeine Other (See Comments)    Bloated      Review of Systems  Constitutional: Negative.   Respiratory: Negative.   Cardiovascular: Negative.  Negative for chest pain, palpitations and leg swelling.  Neurological: Negative for dizziness and headaches.     Today's Vitals   10/30/18 1044  BP: 110/60  Pulse: 61  Temp: 97.8 F (36.6 C)  TempSrc: Oral  Weight: 138 lb (62.6 kg)  PainSc: 0-No pain   Body mass index is 24.14 kg/m.   Objective:  Physical Exam Constitutional:      Appearance: Normal appearance.  Cardiovascular:     Rate and Rhythm: Normal rate and regular rhythm.  Skin:    General: Skin is warm and dry.      Capillary Refill: Capillary refill takes less than 2 seconds.  Neurological:     General: No focal deficit present.     Mental Status: She is alert and oriented to person, place, and time.  Psychiatric:        Mood and Affect: Mood normal.        Behavior: Behavior normal.        Thought Content: Thought content normal.        Judgment: Judgment normal.         Assessment And Plan:     1. Vitamin B12 deficiency  Tolerating well  Return in 1 week for vitamin B12 injection 4th one  Discussed to continue exercising her brain with crossword puzzles - cyanocobalamin ((VITAMIN B-12)) injection 1,000 mcg      [name], FNP    THE PATIENT IS ENCOURAGED TO PRACTICE SOCIAL DISTANCING DUE TO THE COVID-19 PANDEMIC.

## 2018-11-06 ENCOUNTER — Other Ambulatory Visit: Payer: Self-pay

## 2018-11-06 ENCOUNTER — Encounter: Payer: Self-pay | Admitting: Nurse Practitioner

## 2018-11-06 ENCOUNTER — Ambulatory Visit (INDEPENDENT_AMBULATORY_CARE_PROVIDER_SITE_OTHER): Payer: Medicare Other | Admitting: Nurse Practitioner

## 2018-11-06 VITALS — BP 110/62 | HR 84 | Temp 98.4°F | Ht 64.0 in | Wt 140.8 lb

## 2018-11-06 DIAGNOSIS — E538 Deficiency of other specified B group vitamins: Secondary | ICD-10-CM

## 2018-11-06 DIAGNOSIS — F17209 Nicotine dependence, unspecified, with unspecified nicotine-induced disorders: Secondary | ICD-10-CM | POA: Diagnosis not present

## 2018-11-06 MED ORDER — ROSUVASTATIN CALCIUM 40 MG PO TABS: 40.0000 mg | ORAL_TABLET | Freq: Every day | ORAL | 11 refills | Status: DC

## 2018-11-06 MED ORDER — CYANOCOBALAMIN 1000 MCG/ML IJ SOLN
1000.0000 ug | Freq: Once | INTRAMUSCULAR | Status: AC
  Administered 2018-11-06: 1000 ug via INTRAMUSCULAR

## 2018-11-06 NOTE — Progress Notes (Signed)
Subjective:     Patient ID: Sara Fox , female    DOB: 1941/02/09 , 78 y.o.   MRN: [id number]   Chief Complaint  Patient presents with  . B12 Injection    HPI  Here for 4th vitamin B12  She is checking her blood pressure daily 80-188/42-82.  She is not having any symptoms.     Past Medical History:  Diagnosis Date  . Acute kidney injury ([place])   . Back pain   . Cervical cancer ([place])   . CHF (congestive heart failure) ([place])   . COPD (chronic obstructive pulmonary disease) ([place])   . Coronary artery disease    s/p DES x2 to RCA 2016  . GERD (gastroesophageal reflux disease)   . Hyperlipidemia   . Hypertension   . Myocardial infarction ([place])   . Neck pain   . Rhabdomyolysis   . Stomach problems      Family History  Problem Relation Age of Onset  . Heart disease Mother        also HTN  . Diabetes Mother   . Colon cancer Mother   . Heart disease Brother        deceased at 45  . Hypertension Brother   . Heart attack Brother   . Heart disease Brother   . Hypertension Brother      Current Outpatient Medications:  .  aspirin EC 81 MG tablet, Take 81 mg by mouth daily., Disp: , Rfl:  .  budesonide-formoterol (SYMBICORT) 160-4.5 MCG/ACT inhaler, Inhale 2 puffs into the lungs 2 (two) times daily., Disp: , Rfl:  .  CALCIUM CITRATE PO, Take 1 tablet by mouth daily. , Disp: , Rfl:  .  Cholecalciferol (VITAMIN D3) 2000 UNITS capsule, Take 2,000 Units by mouth daily., Disp: , Rfl:  .  furosemide (LASIX) 20 MG tablet, Take 20 mg by mouth every other day., Disp: , Rfl: 1 .  gabapentin (NEURONTIN) 100 MG capsule, Take 100 mg by mouth daily. , Disp: , Rfl:  .  metoprolol tartrate (LOPRESSOR) 25 MG tablet, TAKE 1 TABLET BY MOUTH TWICE DAILY WITH  A  MEAL., Disp: 180 tablet, Rfl: 1 .  Multiple Vitamin (MULTIVITAMIN) capsule, Take 1 capsule by mouth daily. Contains iron, Disp: , Rfl:  .  nitroGLYCERIN (NITROSTAT) 0.4 MG SL tablet, Place 1 tablet (0.4 mg total) under the tongue  every 5 (five) minutes as needed for chest pain., Disp: 25 tablet, Rfl: 3 .  oxybutynin (DITROPAN) 5 MG tablet, Take 1/2 (one-half) tablet by mouth twice daily, Disp: 30 tablet, Rfl: 0 .  pantoprazole (PROTONIX) 40 MG tablet, Take 1 tablet by mouth twice daily, Disp: 180 tablet, Rfl: 1 .  polyethylene glycol (MIRALAX / GLYCOLAX) packet, Take 17 g by mouth daily as needed for mild constipation. , Disp: , Rfl:  .  Potassium Chloride ER 20 MEQ TBCR, TAKE 2 TABLETS BY MOUTH IN THE MORNING, Disp: 60 tablet, Rfl: 11 .  rosuvastatin (CRESTOR) 40 MG tablet, Take 1 tablet (40 mg total) by mouth daily., Disp: 30 tablet, Rfl: 11 .  traMADol (ULTRAM) 50 MG tablet, TAKE 1-2 TABLETS BY MOUTH THREE TIMES DAILY. Do Not Fill this prescription until [facility] for refill. Do Not Fill Before 10/08/2018, Disp: 140 tablet, Rfl: 0 .  vitamin C (ASCORBIC ACID) 500 MG tablet, Take 500 mg by mouth daily., Disp: , Rfl:    Allergies  Allergen Reactions  . Buprenorphine Hcl Shortness Of Breath    Throat swelling/trouble  breathing and lethargic  . Morphine And Related Shortness Of Breath    Throat swelling/trouble breathing and lethargic  . Celebrex [Celecoxib] Diarrhea and Swelling    Swelling of legs   . Vioxx [Rofecoxib] Palpitations  . Codeine Other (See Comments)    Bloated      Review of Systems   Today's Vitals   11/06/18 1009  BP: 110/62  Pulse: 84  Temp: 98.4 F (36.9 C)  TempSrc: Oral  Weight: 140 lb 12.8 oz (63.9 kg)  Height: 5\' 4"  (1.626 m)  PainSc: 0-No pain   Body mass index is 24.17 kg/m.   Objective:  Physical Exam Constitutional:      Appearance: Normal appearance.  Cardiovascular:     Rate and Rhythm: Normal rate and regular rhythm.     Pulses: Normal pulses.     Heart sounds: Normal heart sounds. No murmur.  Pulmonary:     Effort: Pulmonary effort is normal. No respiratory distress.     Breath sounds: Normal breath sounds.  Skin:    General: Skin is warm and dry.      Capillary Refill: Capillary refill takes less than 2 seconds.  Neurological:     General: No focal deficit present.     Mental Status: She is alert and oriented to person, place, and time.  Psychiatric:        Mood and Affect: Mood normal.        Behavior: Behavior normal.        Thought Content: Thought content normal.        Judgment: Judgment normal.         Assessment And Plan:     1. Vitamin B12 deficiency  Will recheck vitamin B12 - cyanocobalamin ((VITAMIN B-12)) injection 1,000 mcg - Vitamin B12  2. Tobacco use disorder, continuous  She is currently not interested quitting smoking  Discussed cardiac and pulmonary risks of smoking   [name], FNP    THE PATIENT IS ENCOURAGED TO PRACTICE SOCIAL DISTANCING DUE TO THE COVID-19 PANDEMIC.

## 2018-11-11 ENCOUNTER — Encounter: Payer: Self-pay | Admitting: Nurse Practitioner

## 2018-11-12 NOTE — Telephone Encounter (Signed)
Opened in error

## 2018-11-23 ENCOUNTER — Other Ambulatory Visit: Payer: Self-pay | Admitting: Nurse Practitioner

## 2018-11-23 ENCOUNTER — Other Ambulatory Visit: Payer: Self-pay | Admitting: Internal Medicine

## 2018-11-23 DIAGNOSIS — R32 Unspecified urinary incontinence: Secondary | ICD-10-CM

## 2018-12-04 ENCOUNTER — Other Ambulatory Visit: Payer: Self-pay

## 2018-12-05 ENCOUNTER — Telehealth: Payer: Self-pay

## 2018-12-05 ENCOUNTER — Ambulatory Visit (INDEPENDENT_AMBULATORY_CARE_PROVIDER_SITE_OTHER): Payer: Medicare Other

## 2018-12-05 ENCOUNTER — Encounter: Payer: Self-pay | Admitting: Nurse Practitioner

## 2018-12-05 ENCOUNTER — Ambulatory Visit (INDEPENDENT_AMBULATORY_CARE_PROVIDER_SITE_OTHER): Payer: Medicare Other | Admitting: Nurse Practitioner

## 2018-12-05 VITALS — BP 106/60 | HR 74 | Temp 98.2°F | Ht 65.0 in | Wt 136.0 lb

## 2018-12-05 DIAGNOSIS — N3001 Acute cystitis with hematuria: Secondary | ICD-10-CM | POA: Diagnosis not present

## 2018-12-05 DIAGNOSIS — I130 Hypertensive heart and chronic kidney disease with heart failure and stage 1 through stage 4 chronic kidney disease, or unspecified chronic kidney disease: Secondary | ICD-10-CM | POA: Diagnosis not present

## 2018-12-05 DIAGNOSIS — Z0000 Encounter for general adult medical examination without abnormal findings: Secondary | ICD-10-CM

## 2018-12-05 DIAGNOSIS — N183 Chronic kidney disease, stage 3 unspecified: Secondary | ICD-10-CM

## 2018-12-05 DIAGNOSIS — E7800 Pure hypercholesterolemia, unspecified: Secondary | ICD-10-CM | POA: Diagnosis not present

## 2018-12-05 DIAGNOSIS — H6123 Impacted cerumen, bilateral: Secondary | ICD-10-CM

## 2018-12-05 DIAGNOSIS — E538 Deficiency of other specified B group vitamins: Secondary | ICD-10-CM | POA: Diagnosis not present

## 2018-12-05 DIAGNOSIS — R413 Other amnesia: Secondary | ICD-10-CM | POA: Diagnosis not present

## 2018-12-05 DIAGNOSIS — I10 Essential (primary) hypertension: Secondary | ICD-10-CM | POA: Diagnosis not present

## 2018-12-05 DIAGNOSIS — F17209 Nicotine dependence, unspecified, with unspecified nicotine-induced disorders: Secondary | ICD-10-CM

## 2018-12-05 LAB — POCT UA - MICROALBUMIN
Creatinine, POC: 300 mg/dL
Microalbumin Ur, POC: 150 mg/L

## 2018-12-05 LAB — POCT URINALYSIS DIPSTICK
Glucose, UA: NEGATIVE
Ketones, UA: NEGATIVE
Nitrite, UA: POSITIVE
Protein, UA: POSITIVE — AB
Spec Grav, UA: >=1.03 — AB (ref 1.010–1.025)
Urobilinogen, UA: 1 E.U./dL
pH, UA: 5.5 (ref 5.0–8.0)

## 2018-12-05 MED ORDER — AMOXICILLIN 500 MG PO TABS: 500.0000 mg | ORAL_TABLET | Freq: Two times a day (BID) | ORAL | 0 refills | Status: DC

## 2018-12-05 MED ORDER — CYANOCOBALAMIN 1000 MCG/ML IJ SOLN
1000.0000 ug | Freq: Once | INTRAMUSCULAR | Status: AC
  Administered 2018-12-05: 1000 ug via INTRAMUSCULAR

## 2018-12-05 NOTE — Telephone Encounter (Signed)
I called pt to notify her that she has a UTI and we have sent her an antibiotic. I have left pt v/m to call the office. YRL,RMA

## 2018-12-05 NOTE — Patient Instructions (Signed)
Health Maintenance, Female Adopting a healthy lifestyle and getting preventive care are important in promoting health and wellness. Ask your health care provider about:  The right schedule for you to have regular tests and exams.  Things you can do on your own to prevent diseases and keep yourself healthy. What should I know about diet, weight, and exercise? Eat a healthy diet   Eat a diet that includes plenty of vegetables, fruits, low-fat dairy products, and lean protein.  Do not eat a lot of foods that are high in solid fats, added sugars, or sodium. Maintain a healthy weight Body mass index (BMI) is used to identify weight problems. It estimates body fat based on height and weight. Your health care provider can help determine your BMI and help you achieve or maintain a healthy weight. Get regular exercise Get regular exercise. This is one of the most important things you can do for your health. Most adults should:  Exercise for at least 150 minutes each week. The exercise should increase your heart rate and make you sweat (moderate-intensity exercise).  Do strengthening exercises at least twice a week. This is in addition to the moderate-intensity exercise.  Spend less time sitting. Even light physical activity can be beneficial. Watch cholesterol and blood lipids Have your blood tested for lipids and cholesterol at 79 years of age, then have this test every 5 years. Have your cholesterol levels checked more often if:  Your lipid or cholesterol levels are high.  You are older than 79 years of age.  You are at high risk for heart disease. What should I know about cancer screening? Depending on your health history and family history, you may need to have cancer screening at various ages. This may include screening for:  Breast cancer.  Cervical cancer.  Colorectal cancer.  Skin cancer.  Lung cancer. What should I know about heart disease, diabetes, and high blood  pressure? Blood pressure and heart disease  High blood pressure causes heart disease and increases the risk of stroke. This is more likely to develop in people who have high blood pressure readings, are of African descent, or are overweight.  Have your blood pressure checked: ? Every 3-5 years if you are 18-39 years of age. ? Every year if you are 40 years old or older. Diabetes Have regular diabetes screenings. This checks your fasting blood sugar level. Have the screening done:  Once every three years after age 40 if you are at a normal weight and have a low risk for diabetes.  More often and at a younger age if you are overweight or have a high risk for diabetes. What should I know about preventing infection? Hepatitis B If you have a higher risk for hepatitis B, you should be screened for this virus. Talk with your health care provider to find out if you are at risk for hepatitis B infection. Hepatitis C Testing is recommended for:  Everyone born from 1945 through 1965.  Anyone with known risk factors for hepatitis C. Sexually transmitted infections (STIs)  Get screened for STIs, including gonorrhea and chlamydia, if: ? You are sexually active and are younger than 79 years of age. ? You are older than 79 years of age and your health care provider tells you that you are at risk for this type of infection. ? Your sexual activity has changed since you were last screened, and you are at increased risk for chlamydia or gonorrhea. Ask your health care provider if   you are at risk.  Ask your health care provider about whether you are at high risk for HIV. Your health care provider may recommend a prescription medicine to help prevent HIV infection. If you choose to take medicine to prevent HIV, you should first get tested for HIV. You should then be tested every 3 months for as long as you are taking the medicine. Pregnancy  If you are about to stop having your period (premenopausal) and  you may become pregnant, seek counseling before you get pregnant.  Take 400 to 800 micrograms (mcg) of folic acid every day if you become pregnant.  Ask for birth control (contraception) if you want to prevent pregnancy. Osteoporosis and menopause Osteoporosis is a disease in which the bones lose minerals and strength with aging. This can result in bone fractures. If you are 65 years old or older, or if you are at risk for osteoporosis and fractures, ask your health care provider if you should:  Be screened for bone loss.  Take a calcium or vitamin D supplement to lower your risk of fractures.  Be given hormone replacement therapy (HRT) to treat symptoms of menopause. Follow these instructions at home: Lifestyle  Do not use any products that contain nicotine or tobacco, such as cigarettes, e-cigarettes, and chewing tobacco. If you need help quitting, ask your health care provider.  Do not use street drugs.  Do not share needles.  Ask your health care provider for help if you need support or information about quitting drugs. Alcohol use  Do not drink alcohol if: ? Your health care provider tells you not to drink. ? You are pregnant, may be pregnant, or are planning to become pregnant.  If you drink alcohol: ? Limit how much you use to 0-1 drink a day. ? Limit intake if you are breastfeeding.  Be aware of how much alcohol is in your drink. In the [place] one drink equals one 12 oz bottle of beer (355 mL), one 5 oz glass of wine (148 mL), or one 1 oz glass of hard liquor (44 mL). General instructions  Schedule regular health, dental, and eye exams.  Stay current with your vaccines.  Tell your health care provider if: ? You often feel depressed. ? You have ever been abused or do not feel safe at home. Summary  Adopting a healthy lifestyle and getting preventive care are important in promoting health and wellness.  Follow your health care provider's instructions about healthy  diet, exercising, and getting tested or screened for diseases.  Follow your health care provider's instructions on monitoring your cholesterol and blood pressure. This information is not intended to replace advice given to you by your health care provider. Make sure you discuss any questions you have with your health care provider. Document Released: 11/14/2010 Document Revised: 04/24/2018 Document Reviewed: 04/24/2018 [employer] Patient Education  2020 [employer]  

## 2018-12-05 NOTE — Patient Instructions (Signed)
Sara Fox , Thank you for taking time to come for your Medicare Wellness Visit. I appreciate your ongoing commitment to your health goals. Please review the following plan we discussed and let me know if I can assist you in the future.   Screening recommendations/referrals: Colonoscopy: not required Mammogram: not required Bone Density: 07/2017 Recommended yearly ophthalmology/optometry visit for glaucoma screening and checkup Recommended yearly dental visit for hygiene and checkup  Vaccinations: Influenza vaccine: decline Pneumococcal vaccine: 08/2011 Tdap vaccine: 08/2011  Shingles vaccine: discussed    Advanced directives: Please bring a copy of your POA (Power of [place]) and/or Living Will to your next appointment.    Conditions/risks identified: smoking  Next appointment: 03/11/2019 at 10:15   Preventive Care 11 Years and Older, Female Preventive care refers to lifestyle choices and visits with your health care provider that can promote health and wellness. What does preventive care include?  A yearly physical exam. This is also called an annual well check.  Dental exams once or twice a year.  Routine eye exams. Ask your health care provider how often you should have your eyes checked.  Personal lifestyle choices, including:  Daily care of your teeth and gums.  Regular physical activity.  Eating a healthy diet.  Avoiding tobacco and drug use.  Limiting alcohol use.  Practicing safe sex.  Taking low-dose aspirin every day.  Taking vitamin and mineral supplements as recommended by your health care provider. What happens during an annual well check? The services and screenings done by your health care provider during your annual well check will depend on your age, overall health, lifestyle risk factors, and family history of disease. Counseling  Your health care provider may ask you questions about your:  Alcohol use.  Tobacco use.  Drug use.  Emotional  well-being.  Home and relationship well-being.  Sexual activity.  Eating habits.  History of falls.  Memory and ability to understand (cognition).  Work and work Statistician.  Reproductive health. Screening  You may have the following tests or measurements:  Height, weight, and BMI.  Blood pressure.  Lipid and cholesterol levels. These may be checked every 5 years, or more frequently if you are over 34 years old.  Skin check.  Lung cancer screening. You may have this screening every year starting at age 77 if you have a 30-pack-year history of smoking and currently smoke or have quit within the past 15 years.  Fecal occult blood test (FOBT) of the stool. You may have this test every year starting at age 53.  Flexible sigmoidoscopy or colonoscopy. You may have a sigmoidoscopy every 5 years or a colonoscopy every 10 years starting at age 20.  Hepatitis C blood test.  Hepatitis B blood test.  Sexually transmitted disease (STD) testing.  Diabetes screening. This is done by checking your blood sugar (glucose) after you have not eaten for a while (fasting). You may have this done every 1-3 years.  Bone density scan. This is done to screen for osteoporosis. You may have this done starting at age 28.  Mammogram. This may be done every 1-2 years. Talk to your health care provider about how often you should have regular mammograms. Talk with your health care provider about your test results, treatment options, and if necessary, the need for more tests. Vaccines  Your health care provider may recommend certain vaccines, such as:  Influenza vaccine. This is recommended every year.  Tetanus, diphtheria, and acellular pertussis (Tdap, Td) vaccine. You may need  a Td booster every 10 years.  Zoster vaccine. You may need this after age 75.  Pneumococcal 13-valent conjugate (PCV13) vaccine. One dose is recommended after age 5.  Pneumococcal polysaccharide (PPSV23) vaccine. One  dose is recommended after age 80. Talk to your health care provider about which screenings and vaccines you need and how often you need them. This information is not intended to replace advice given to you by your health care provider. Make sure you discuss any questions you have with your health care provider. Document Released: 05/28/2015 Document Revised: 01/19/2016 Document Reviewed: 03/02/2015 Elsevier Interactive Patient Education  2017 [place] Prevention in the Home Falls can cause injuries. They can happen to people of all ages. There are many things you can do to make your home safe and to help prevent falls. What can I do on the outside of my home?  Regularly fix the edges of walkways and driveways and fix any cracks.  Remove anything that might make you trip as you walk through a door, such as a raised step or threshold.  Trim any bushes or trees on the path to your home.  Use bright outdoor lighting.  Clear any walking paths of anything that might make someone trip, such as rocks or tools.  Regularly check to see if handrails are loose or broken. Make sure that both sides of any steps have handrails.  Any raised decks and porches should have guardrails on the edges.  Have any leaves, snow, or ice cleared regularly.  Use sand or salt on walking paths during winter.  Clean up any spills in your garage right away. This includes oil or grease spills. What can I do in the bathroom?  Use night lights.  Install grab bars by the toilet and in the tub and shower. Do not use towel bars as grab bars.  Use non-skid mats or decals in the tub or shower.  If you need to sit down in the shower, use a plastic, non-slip stool.  Keep the floor dry. Clean up any water that spills on the floor as soon as it happens.  Remove soap buildup in the tub or shower regularly.  Attach bath mats securely with double-sided non-slip rug tape.  Do not have throw rugs and other  things on the floor that can make you trip. What can I do in the bedroom?  Use night lights.  Make sure that you have a light by your bed that is easy to reach.  Do not use any sheets or blankets that are too big for your bed. They should not hang down onto the floor.  Have a firm chair that has side arms. You can use this for support while you get dressed.  Do not have throw rugs and other things on the floor that can make you trip. What can I do in the kitchen?  Clean up any spills right away.  Avoid walking on wet floors.  Keep items that you use a lot in easy-to-reach places.  If you need to reach something above you, use a strong step stool that has a grab bar.  Keep electrical cords out of the way.  Do not use floor polish or wax that makes floors slippery. If you must use wax, use non-skid floor wax.  Do not have throw rugs and other things on the floor that can make you trip. What can I do with my stairs?  Do not leave any items on the  stairs.  Make sure that there are handrails on both sides of the stairs and use them. Fix handrails that are broken or loose. Make sure that handrails are as long as the stairways.  Check any carpeting to make sure that it is firmly attached to the stairs. Fix any carpet that is loose or worn.  Avoid having throw rugs at the top or bottom of the stairs. If you do have throw rugs, attach them to the floor with carpet tape.  Make sure that you have a light switch at the top of the stairs and the bottom of the stairs. If you do not have them, ask someone to add them for you. What else can I do to help prevent falls?  Wear shoes that:  Do not have high heels.  Have rubber bottoms.  Are comfortable and fit you well.  Are closed at the toe. Do not wear sandals.  If you use a stepladder:  Make sure that it is fully opened. Do not climb a closed stepladder.  Make sure that both sides of the stepladder are locked into place.  Ask  someone to hold it for you, if possible.  Clearly mark and make sure that you can see:  Any grab bars or handrails.  First and last steps.  Where the edge of each step is.  Use tools that help you move around (mobility aids) if they are needed. These include:  Canes.  Walkers.  Scooters.  Crutches.  Turn on the lights when you go into a dark area. Replace any light bulbs as soon as they burn out.  Set up your furniture so you have a clear path. Avoid moving your furniture around.  If any of your floors are uneven, fix them.  If there are any pets around you, be aware of where they are.  Review your medicines with your doctor. Some medicines can make you feel dizzy. This can increase your chance of falling. Ask your doctor what other things that you can do to help prevent falls. This information is not intended to replace advice given to you by your health care provider. Make sure you discuss any questions you have with your health care provider. Document Released: 02/25/2009 Document Revised: 10/07/2015 Document Reviewed: 06/05/2014 Elsevier Interactive Patient Education  2017 [employer].

## 2018-12-05 NOTE — Addendum Note (Signed)
Addended by: Glenna Durand E on: 12/05/2018 11:55 AM   Modules accepted: Orders

## 2018-12-05 NOTE — Progress Notes (Signed)
Subjective:     Patient ID: Sara Fox , female    DOB: 1940/09/21 , 78 y.o.   MRN: [id number]  The patient states she uses post menopausal status for birth control. Last LMP was No LMP recorded. Patient has had a hysterectomy.. Negative for Dysmenorrhea and Negative for Menorrhagia Mammogram last done (aged out) Negative for: breast discharge, breast lump(s), breast pain and breast self exam.  Pertinent negatives include abnormal bleeding (hematology), anxiety, decreased libido, depression, difficulty falling sleep, dyspareunia, history of infertility, nocturia, sexual dysfunction, sleep disturbances, urinary incontinence, urinary urgency, vaginal discharge and vaginal itching. Diet regular. The patient states her exercise level is  minimal but she has 2 dogs she cares for.      The patient's tobacco use is:  Social History   Tobacco Use  Smoking Status Current Every Day Smoker  . Packs/day: 0.50  . Years: 60.00  . Pack years: 30.00  Smokeless Tobacco Never Used  Tobacco Comment   currently smokes 1/2ppd or less   She has been exposed to passive smoke. The patient's alcohol use is:  Social History   Substance and Sexual Activity  Alcohol Use No   Additional information: Last pap (N/A)  Chief Complaint  Patient presents with  . Annual Exam    HPI  Here today with her son for her HM   Hypertension This is a chronic problem. The current episode started more than 1 year ago. The problem is unchanged. The problem is controlled. Pertinent negatives include no anxiety, chest pain, headaches, palpitations, peripheral edema or shortness of breath. There are no associated agents to hypertension. Risk factors for coronary artery disease include sedentary lifestyle. Past treatments include diuretics. There are no compliance problems.  There is no history of angina. There is no history of chronic renal disease.     Past Medical History:  Diagnosis Date  . Acute kidney injury ([place])    . Back pain   . Cervical cancer ([place])   . CHF (congestive heart failure) ([place])   . COPD (chronic obstructive pulmonary disease) ([place])   . Coronary artery disease    s/p DES x2 to RCA 2016  . GERD (gastroesophageal reflux disease)   . Hyperlipidemia   . Hypertension   . Myocardial infarction ([place])   . Neck pain   . Rhabdomyolysis   . Stomach problems      Family History  Problem Relation Age of Onset  . Heart disease Mother        also HTN  . Diabetes Mother   . Colon cancer Mother   . Heart disease Brother        deceased at 66  . Hypertension Brother   . Heart attack Brother   . Heart disease Brother   . Hypertension Brother      Current Outpatient Medications:  .  amoxicillin (AMOXIL) 500 MG tablet, Take 1 tablet (500 mg total) by mouth 2 (two) times daily., Disp: 10 tablet, Rfl: 0 .  aspirin EC 81 MG tablet, Take 81 mg by mouth daily., Disp: , Rfl:  .  budesonide-formoterol (SYMBICORT) 160-4.5 MCG/ACT inhaler, Inhale 2 puffs into the lungs 2 (two) times daily., Disp: , Rfl:  .  CALCIUM CITRATE PO, Take 1 tablet by mouth daily. , Disp: , Rfl:  .  Cholecalciferol (VITAMIN D3) 2000 UNITS capsule, Take 2,000 Units by mouth daily., Disp: , Rfl:  .  furosemide (LASIX) 20 MG tablet, TAKE 1 TABLET BY MOUTH EVERY OTHER DAY,  Disp: 45 tablet, Rfl: 0 .  gabapentin (NEURONTIN) 100 MG capsule, Take 100 mg by mouth daily. , Disp: , Rfl:  .  metoprolol tartrate (LOPRESSOR) 25 MG tablet, TAKE 1 TABLET BY MOUTH TWICE DAILY WITH  A  MEAL., Disp: 180 tablet, Rfl: 1 .  Multiple Vitamin (MULTIVITAMIN) capsule, Take 1 capsule by mouth daily. Contains iron, Disp: , Rfl:  .  nitroGLYCERIN (NITROSTAT) 0.4 MG SL tablet, Place 1 tablet (0.4 mg total) under the tongue every 5 (five) minutes as needed for chest pain., Disp: 25 tablet, Rfl: 3 .  oxybutynin (DITROPAN) 5 MG tablet, Take 1/2 (one-half) tablet by mouth twice daily, Disp: 30 tablet, Rfl: 0 .  pantoprazole (PROTONIX) 40 MG tablet, Take 1  tablet by mouth twice daily, Disp: 180 tablet, Rfl: 1 .  polyethylene glycol (MIRALAX / GLYCOLAX) packet, Take 17 g by mouth daily as needed for mild constipation. , Disp: , Rfl:  .  Potassium Chloride ER 20 MEQ TBCR, TAKE 2 TABLETS BY MOUTH IN THE MORNING, Disp: 60 tablet, Rfl: 11 .  rosuvastatin (CRESTOR) 40 MG tablet, Take 1 tablet (40 mg total) by mouth daily., Disp: 30 tablet, Rfl: 11 .  traMADol (ULTRAM) 50 MG tablet, TAKE 1-2 TABLETS BY MOUTH THREE TIMES DAILY. Do Not Fill this prescription until [facility] for refill. Do Not Fill Before 10/08/2018, Disp: 140 tablet, Rfl: 0 .  vitamin C (ASCORBIC ACID) 500 MG tablet, Take 500 mg by mouth daily., Disp: , Rfl:    Allergies  Allergen Reactions  . Buprenorphine Hcl Shortness Of Breath    Throat swelling/trouble breathing and lethargic  . Morphine And Related Shortness Of Breath    Throat swelling/trouble breathing and lethargic  . Celebrex [Celecoxib] Diarrhea and Swelling    Swelling of legs   . Vioxx [Rofecoxib] Palpitations  . Codeine Other (See Comments)    Bloated      Review of Systems  Constitutional: Negative.  Negative for fatigue.  HENT: Negative.   Eyes: Negative.   Respiratory: Negative.  Negative for shortness of breath.   Cardiovascular: Negative.  Negative for chest pain and palpitations.  Gastrointestinal: Negative.   Endocrine: Negative.  Negative for polydipsia, polyphagia and polyuria.  Genitourinary: Negative.   Musculoskeletal: Negative.   Skin: Negative.   Allergic/Immunologic: Negative.   Neurological: Negative.  Negative for headaches.  Hematological: Negative.   Psychiatric/Behavioral: Negative.      Today's Vitals   12/05/18 1053  BP: 106/60  Pulse: 74  Temp: 98.2 F (36.8 C)  TempSrc: Oral  Weight: 136 lb (61.7 kg)  Height: 5\' 5"  (1.651 m)  PainSc: 0-No pain   Body mass index is 22.63 kg/m.   Objective:  Physical Exam Constitutional:      Appearance: Normal appearance. She is  well-developed.  HENT:     Head: Normocephalic and atraumatic.     Right Ear: Hearing, tympanic membrane, ear canal and external ear normal. There is no impacted cerumen.     Left Ear: Hearing, tympanic membrane, ear canal and external ear normal. There is no impacted cerumen.  Eyes:     General: Lids are normal.     Extraocular Movements: Extraocular movements intact.     Conjunctiva/sclera: Conjunctivae normal.     Pupils: Pupils are equal, round, and reactive to light.     Funduscopic exam:    Right eye: No papilledema.        Left eye: No papilledema.  Neck:     Musculoskeletal:  Full passive range of motion without pain, normal range of motion and neck supple.     Thyroid: No thyroid mass.     Vascular: No carotid bruit.  Cardiovascular:     Rate and Rhythm: Normal rate and regular rhythm.     Pulses: Normal pulses.     Heart sounds: Normal heart sounds. No murmur.  Pulmonary:     Effort: Pulmonary effort is normal.     Breath sounds: Normal breath sounds.  Abdominal:     General: Abdomen is flat. Bowel sounds are normal.     Palpations: Abdomen is soft.  Musculoskeletal: Normal range of motion.        General: No swelling.     Right lower leg: No edema.     Left lower leg: No edema.  Skin:    General: Skin is warm and dry.     Capillary Refill: Capillary refill takes less than 2 seconds.     Coloration: Skin is not pale.  Neurological:     General: No focal deficit present.     Mental Status: She is alert and oriented to person, place, and time.     Cranial Nerves: No cranial nerve deficit.     Sensory: No sensory deficit.  Psychiatric:        Mood and Affect: Mood normal.        Behavior: Behavior normal.        Thought Content: Thought content normal.        Judgment: Judgment normal.         Assessment And Plan:     1. Health maintenance examination . Behavior modifications discussed and diet history reviewed.   . Pt will continue to exercise regularly and  modify diet with low GI, plant based foods and decrease intake of processed foods.  . Recommend intake of daily multivitamin, Vitamin D, and calcium.  . Recommend mammogram and colonoscopy for preventive screenings, as well as recommend immunizations that include routine immunizations   2. Essential hypertension  Chronic, excellent control.   Continue with current medications and follow up with cardiology - CMP14 + Anion Gap - CBC no Diff  3. 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 Vitamin B12 deficiency  Will check vitamin b12 level  She will also receive vitamin b12 injection pending results will determine when she needs the next one - Vitamin B12  6. Tobacco use disorder, continuous  Continues to not be interested in quitting smoking,   She is aware of the risk of heart disease and lung disease.   7. Memory loss  Chronic  Stable, hopefully the vitamin b12 will help improve this  8. Hypercholesterolemia  Chronic   Continue with avoiding foods high in cholesterol  - Lipid Profile  [name], FNP    THE PATIENT IS ENCOURAGED TO PRACTICE SOCIAL DISTANCING DUE TO THE COVID-19 PANDEMIC.

## 2018-12-05 NOTE — Progress Notes (Signed)
Subjective:   Sara Fox is a 78 y.o. female who presents for Medicare Annual (Subsequent) preventive examination.  Review of Systems:  n/a Cardiac Risk Factors include: advanced age (>62men, >86 women);hypertension;sedentary lifestyle     Objective:     Vitals: BP 106/60 (BP Location: Left Arm, Patient Position: Sitting, Cuff Size: Normal)   Pulse 74   Temp 98.2 F (36.8 C) (Oral)   Ht 5\' 5"  (1.651 m)   Wt 136 lb (61.7 kg)   SpO2 97%   BMI 22.63 kg/m   Body mass index is 22.63 kg/m.  Advanced Directives 12/05/2018 11/07/2017 07/11/2017 07/11/2017 07/10/2017 05/27/2017 10/13/2016  Does Patient Have a Medical Advance Directive? Yes Yes Yes (No Data) Yes No Yes  Type of Paramedic of [place];Living will [place];Living will Living will - Living will - [place];Living will  Does patient want to make changes to medical advance directive? No - Patient declined - No - Patient declined - - - -  Copy of [place] in Chart? No - copy requested No - copy requested - - - - -  Would patient like information on creating a medical advance directive? - - - - - - -    Tobacco Social History   Tobacco Use  Smoking Status Current Every Day Smoker  . Packs/day: 0.50  . Years: 60.00  . Pack years: 30.00  Smokeless Tobacco Never Used  Tobacco Comment   currently smokes 1/2ppd or less     Ready to quit: Not Answered Counseling given: Not Answered Comment: currently smokes 1/2ppd or less   Clinical Intake:  Pre-visit preparation completed: Yes  Pain : No/denies pain     Nutritional Status: BMI of 19-24  Normal Nutritional Risks: None Diabetes: No  How often do you need to have someone help you when you read instructions, pamphlets, or other written materials from your doctor or pharmacy?: 1 - Never What is the last grade level you completed in school?: GED  Interpreter Needed?: No  Information  entered by :: NAllen LPN  Past Medical History:  Diagnosis Date  . Acute kidney injury ([place])   . Back pain   . Cervical cancer ([place])   . CHF (congestive heart failure) ([place])   . COPD (chronic obstructive pulmonary disease) ([place])   . Coronary artery disease    s/p DES x2 to RCA 2016  . GERD (gastroesophageal reflux disease)   . Hyperlipidemia   . Hypertension   . Myocardial infarction ([place])   . Neck pain   . Rhabdomyolysis   . Stomach problems    Past Surgical History:  Procedure Laterality Date  . ABDOMINAL HYSTERECTOMY    . BACK SURGERY  2012   lower back  . CARDIAC CATHETERIZATION  06/1999   noncritical disease invovling PDA  . CARDIAC CATHETERIZATION N/A 09/14/2014   Procedure: Left Heart Cath and Coronary Angiography;  Surgeon: Leonie Man, MD;  Location: [facility] INVASIVE CV LAB CUPID;  Service: Cardiovascular;  Laterality: N/A;  . CHOLECYSTECTOMY    . ESOPHAGOGASTRODUODENOSCOPY (EGD) WITH PROPOFOL Left 07/19/2017   Procedure: ESOPHAGOGASTRODUODENOSCOPY (EGD) WITH PROPOFOL;  Surgeon: Ronnette Juniper, MD;  [facility];  Service: Gastroenterology;  Laterality: Left;  . FRACTURE SURGERY Left 05/2017   left wrist  . HARDWARE REMOVAL Left 11/07/2017   Procedure: LEFT WRIST HARDWARE REMOVAL;  Surgeon: Charlotte Crumb, MD;  Location: [place];  Service: Orthopedics;  Laterality: Left;  . KNEE  SURGERY Bilateral 2001 & 2007  . NECK SURGERY  2012   2012  . PARTIAL GASTRECTOMY  2005   subtotal  . PERCUTANEOUS CORONARY STENT INTERVENTION (PCI-S)  09/14/2014   Procedure: Percutaneous Coronary Stent Intervention (Pci-S);  Surgeon: Leonie Man, MD;  Location: [facility] INVASIVE CV LAB CUPID;  Service: Cardiovascular;;  . SHOULDER SURGERY Right   . TRANSTHORACIC ECHOCARDIOGRAM  06/02/2010   EF=>55%, normal LV systolic function; normal RV systolic function; mild mitral annular calcif; trace TR; AV mildly sclerotic  . WRIST OSTEOTOMY Left 11/07/2017   Procedure: LEFT WRIST DISTAL ULNA  RESECTION WITH EXTENSOR CARPI ULNARIS STABILIZATION;  Surgeon: Charlotte Crumb, MD;  Location: [place];  Service: Orthopedics;  Laterality: Left;   Family History  Problem Relation Age of Onset  . Heart disease Mother        also HTN  . Diabetes Mother   . Colon cancer Mother   . Heart disease Brother        deceased at 40  . Hypertension Brother   . Heart attack Brother   . Heart disease Brother   . Hypertension Brother    Social History   Socioeconomic History  . Marital status: Married    Spouse name: Not on file  . Number of children: 2  . Years of education: GED  . Highest education level: Not on file  Occupational History    Employer: RETIRED  Social Needs  . Financial resource strain: Not hard at all  . Food insecurity    Worry: Never true    Inability: Never true  . Transportation needs    Medical: No    Non-medical: No  Tobacco Use  . Smoking status: Current Every Day Smoker    Packs/day: 0.50    Years: 60.00    Pack years: 30.00  . Smokeless tobacco: Never Used  . Tobacco comment: currently smokes 1/2ppd or less  Substance and Sexual Activity  . Alcohol use: No  . Drug use: No  . Sexual activity: Not Currently  Lifestyle  . Physical activity    Days per week: 0 days    Minutes per session: 0 min  . Stress: Not at all  Relationships  . Social Herbalist on phone: Not on file    Gets together: Not on file    Attends religious service: Not on file    Active member of club or organization: Not on file    Attends meetings of clubs or organizations: Not on file    Relationship status: Not on file  Other Topics Concern  . Not on file  Social History Narrative  . Not on file    Outpatient Encounter Medications as of 12/05/2018  Medication Sig  . aspirin EC 81 MG tablet Take 81 mg by mouth daily.  . budesonide-formoterol (SYMBICORT) 160-4.5 MCG/ACT inhaler Inhale 2 puffs into the lungs 2 (two) times daily.  Marland Kitchen CALCIUM CITRATE PO Take 1  tablet by mouth daily.   . Cholecalciferol (VITAMIN D3) 2000 UNITS capsule Take 2,000 Units by mouth daily.  . furosemide (LASIX) 20 MG tablet TAKE 1 TABLET BY MOUTH EVERY OTHER DAY  . gabapentin (NEURONTIN) 100 MG capsule Take 100 mg by mouth daily.   . metoprolol tartrate (LOPRESSOR) 25 MG tablet TAKE 1 TABLET BY MOUTH TWICE DAILY WITH  A  MEAL.  . Multiple Vitamin (MULTIVITAMIN) capsule Take 1 capsule by mouth daily. Contains iron  . nitroGLYCERIN (NITROSTAT) 0.4 MG SL tablet Place  1 tablet (0.4 mg total) under the tongue every 5 (five) minutes as needed for chest pain.  Marland Kitchen oxybutynin (DITROPAN) 5 MG tablet Take 1/2 (one-half) tablet by mouth twice daily  . pantoprazole (PROTONIX) 40 MG tablet Take 1 tablet by mouth twice daily  . polyethylene glycol (MIRALAX / GLYCOLAX) packet Take 17 g by mouth daily as needed for mild constipation.   . Potassium Chloride ER 20 MEQ TBCR TAKE 2 TABLETS BY MOUTH IN THE MORNING  . rosuvastatin (CRESTOR) 40 MG tablet Take 1 tablet (40 mg total) by mouth daily.  . traMADol (ULTRAM) 50 MG tablet TAKE 1-2 TABLETS BY MOUTH THREE TIMES DAILY. Do Not Fill this prescription until [facility] for refill. Do Not Fill Before 10/08/2018  . vitamin C (ASCORBIC ACID) 500 MG tablet Take 500 mg by mouth daily.   No facility-administered encounter medications on file as of 12/05/2018.     Activities of Daily Living In your present state of health, do you have any difficulty performing the following activities: 12/05/2018  Hearing? N  Vision? Y  Comment small fine print  Difficulty concentrating or making decisions? N  Walking or climbing stairs? N  Dressing or bathing? N  Doing errands, shopping? Y  Comment always has someone with her  Preparing Food and eating ? N  Using the Toilet? N  In the past six months, have you accidently leaked urine? N  Do you have problems with loss of bowel control? N  Managing your Medications? Y  Comment son sets up prescription meds  she manages vitamins  Managing your Finances? N  Housekeeping or managing your Housekeeping? N  Some recent data might be hidden    Patient Care Team: [name], FNP as PCP - General ([place]) Debara Pickett Nadean Corwin, MD as PCP - Cardiology (Cardiology)    Assessment:   This is a routine wellness examination for Oanh.  Exercise Activities and Dietary recommendations Current Exercise Habits: The patient does not participate in regular exercise at present  Goals    . Patient Stated     12/05/2018, patient goal to stay well       Fall Risk Fall Risk  12/05/2018 10/16/2018 09/02/2018 07/08/2018 06/03/2018  Falls in the past year? 0 1 1 1 1   Comment - - - - -  Number falls in past yr: 0 1 1 0 0  Injury with Fall? - 0 0 0 -  Risk Factor Category  - - - - -  Risk for fall due to : Medication side effect - - Impaired balance/gait;Impaired mobility -  Risk for fall due to: Comment - - - - -  Follow up Falls evaluation completed;Education provided;Falls prevention discussed - - Falls prevention discussed;Education provided -  Comment - - - - -   Is the patient's home free of loose throw rugs in walkways, pet beds, electrical cords, etc?   yes      Grab bars in the bathroom? no      Handrails on the stairs?   n/a      Adequate lighting?   yes  Timed Get Up and Go performed: n/a  Depression Screen PHQ 2/9 Scores 12/05/2018 09/02/2018 06/03/2018 03/12/2018  PHQ - 2 Score 0 0 0 0  PHQ- 9 Score 0 - - -     Cognitive Function     6CIT Screen 12/05/2018  What Year? 0 points  What month? 0 points  What time? 0 points  Count back  from 20 0 points  Months in reverse 4 points  Repeat phrase 0 points  Total Score 4     There is no immunization history on file for this patient.  Qualifies for Shingles Vaccine? yes  Screening Tests Health Maintenance  Topic Date Due  . PNA vac Low Risk Adult (2 of 2 - PPSV23) 12/05/2019 (Originally 08/29/2012)  . INFLUENZA VACCINE   12/14/2018  . TETANUS/TDAP  08/29/2021  . DEXA SCAN  Completed    Cancer Screenings: Lung: Low Dose CT Chest recommended if Age 42-80 years, 30 pack-year currently smoking OR have quit w/in 15years. Patient does not qualify. Breast:  Up to date on Mammogram? Yes   Up to date of Bone Density/Dexa? Yes Colorectal: not required  Additional Screenings: : Hepatitis C Screening: n/a     Plan:    6 CIT was 4. Patient wants to stay well. Is still smoking cigarettes down to 1/2 a pack.  I have personally reviewed and noted the following in the patient's chart:   . Medical and social history . Use of alcohol, tobacco or illicit drugs  . Current medications and supplements . Functional ability and status . Nutritional status . Physical activity . Advanced directives . List of other physicians . Hospitalizations, surgeries, and ER visits in previous 12 months . Vitals . Screenings to include cognitive, depression, and falls . Referrals and appointments  In addition, I have reviewed and discussed with patient certain preventive protocols, quality metrics, and best practice recommendations. A written personalized care plan for preventive services as well as general preventive health recommendations were provided to patient.     Kellie Simmering, LPN  0/25/4270

## 2018-12-06 LAB — LIPID PANEL
Chol/HDL Ratio: 3.3 ratio (ref 0.0–4.4)
Cholesterol, Total: 141 mg/dL (ref 100–199)
HDL: 43 mg/dL (ref >39)
LDL Calculated: 76 mg/dL (ref 0–99)
Triglycerides: 109 mg/dL (ref 0–149)
VLDL Cholesterol Cal: 22 mg/dL (ref 5–40)

## 2018-12-06 LAB — CMP14 + ANION GAP
ALT: 14 IU/L (ref 0–32)
AST: 22 IU/L (ref 0–40)
Albumin/Globulin Ratio: 1.5 (ref 1.2–2.2)
Albumin: 4.1 g/dL (ref 3.7–4.7)
Alkaline Phosphatase: 116 IU/L (ref 39–117)
Anion Gap: 16 mmol/L (ref 10.0–18.0)
BUN/Creatinine Ratio: 9 — ABNORMAL LOW (ref 12–28)
BUN: 16 mg/dL (ref 8–27)
Bilirubin Total: 0.3 mg/dL (ref 0.0–1.2)
CO2: 18 mmol/L — ABNORMAL LOW (ref 20–29)
Calcium: 9.4 mg/dL (ref 8.7–10.3)
Chloride: 105 mmol/L (ref 96–106)
Creatinine, Ser: 1.83 mg/dL — ABNORMAL HIGH (ref 0.57–1.00)
GFR calc Af Amer: 30 mL/min/{1.73_m2} — ABNORMAL LOW (ref >59)
GFR calc non Af Amer: 26 mL/min/{1.73_m2} — ABNORMAL LOW (ref >59)
Globulin, Total: 2.8 g/dL (ref 1.5–4.5)
Glucose: 91 mg/dL (ref 65–99)
Potassium: 4.6 mmol/L (ref 3.5–5.2)
Sodium: 139 mmol/L (ref 134–144)
Total Protein: 6.9 g/dL (ref 6.0–8.5)

## 2018-12-06 LAB — CBC
Hematocrit: 33.2 % — ABNORMAL LOW (ref 34.0–46.6)
Hemoglobin: 10.7 g/dL — ABNORMAL LOW (ref 11.1–15.9)
MCH: 30.1 pg (ref 26.6–33.0)
MCHC: 32.2 g/dL (ref 31.5–35.7)
MCV: 94 fL (ref 79–97)
Platelets: 312 10*3/uL (ref 150–450)
RBC: 3.55 x10E6/uL — ABNORMAL LOW (ref 3.77–5.28)
RDW: 12.2 % (ref 11.7–15.4)
WBC: 4.6 10*3/uL (ref 3.4–10.8)

## 2018-12-06 LAB — VITAMIN B12: Vitamin B-12: 1479 pg/mL — ABNORMAL HIGH (ref 232–1245)

## 2018-12-07 LAB — URINE CULTURE

## 2018-12-11 ENCOUNTER — Telehealth: Payer: Self-pay

## 2018-12-11 NOTE — Telephone Encounter (Signed)
Patient returned my call regarding his labs [facility]

## 2018-12-18 ENCOUNTER — Other Ambulatory Visit: Payer: Self-pay | Admitting: Internal Medicine

## 2018-12-18 DIAGNOSIS — R32 Unspecified urinary incontinence: Secondary | ICD-10-CM

## 2018-12-30 ENCOUNTER — Ambulatory Visit: Payer: Medicare Other | Admitting: Physical Medicine & Rehabilitation

## 2019-01-02 ENCOUNTER — Encounter: Payer: Medicare Other | Attending: Physical Medicine & Rehabilitation | Admitting: Physical Medicine & Rehabilitation

## 2019-01-02 ENCOUNTER — Other Ambulatory Visit: Payer: Self-pay

## 2019-01-02 ENCOUNTER — Encounter: Payer: Self-pay | Admitting: Physical Medicine & Rehabilitation

## 2019-01-02 VITALS — BP 109/68 | HR 61 | Temp 98.3°F | Resp 16 | Ht 65.0 in | Wt 135.0 lb

## 2019-01-02 DIAGNOSIS — M533 Sacrococcygeal disorders, not elsewhere classified: Secondary | ICD-10-CM | POA: Diagnosis not present

## 2019-01-02 DIAGNOSIS — R209 Unspecified disturbances of skin sensation: Secondary | ICD-10-CM

## 2019-01-02 MED ORDER — TRAMADOL HCL 50 MG PO TABS: ORAL_TABLET | ORAL | 5 refills | Status: DC

## 2019-01-02 NOTE — Progress Notes (Signed)
Subjective:    Patient ID: Sara Fox, female    DOB: Sep 24, 1940, 78 y.o.   MRN: [id number]  HPI  Hx sacroiliac fusion that did not respond to SI injection in past Hx cervical fusion not much neck pain   No falls  Stumbling denies dizziness or lightheadedness Seen by podiatrst for shoe insert, feet hurting less Denies numbness or tingling in feet . No hx of diabetes, does not feel like she is dragging toes Pain Inventory Average Pain 6 Pain Right Now 7 My pain is dull  In the last 24 hours, has pain interfered with the following? General activity 5 Relation with others 4 Enjoyment of life 5 What TIME of day is your pain at its worst? evening Sleep (in general) Fair  Pain is worse with: walking, bending and some activites Pain improves with: medication Relief from Meds: 8  Mobility use a cane ability to climb steps?  no do you drive?  no  Function retired  Neuro/Psych weakness trouble walking dizziness confusion  Prior Studies Any changes since last visit?  no  Physicians involved in your care Any changes since last visit?  no   Family History  Problem Relation Age of Onset  . Heart disease Mother        also HTN  . Diabetes Mother   . Colon cancer Mother   . Heart disease Brother        deceased at 34  . Hypertension Brother   . Heart attack Brother   . Heart disease Brother   . Hypertension Brother    Social History   Socioeconomic History  . Marital status: Married    Spouse name: Not on file  . Number of children: 2  . Years of education: GED  . Highest education level: Not on file  Occupational History    Employer: RETIRED  Social Needs  . Financial resource strain: Not hard at all  . Food insecurity    Worry: Never true    Inability: Never true  . Transportation needs    Medical: No    Non-medical: No  Tobacco Use  . Smoking status: Current Every Day Smoker    Packs/day: 0.50    Years: 60.00    Pack years: 30.00  .  Smokeless tobacco: Never Used  . Tobacco comment: currently smokes 1/2ppd or less  Substance and Sexual Activity  . Alcohol use: No  . Drug use: No  . Sexual activity: Not Currently  Lifestyle  . Physical activity    Days per week: 0 days    Minutes per session: 0 min  . Stress: Not at all  Relationships  . Social Herbalist on phone: Not on file    Gets together: Not on file    Attends religious service: Not on file    Active member of club or organization: Not on file    Attends meetings of clubs or organizations: Not on file    Relationship status: Not on file  Other Topics Concern  . Not on file  Social History Narrative  . Not on file   Past Surgical History:  Procedure Laterality Date  . ABDOMINAL HYSTERECTOMY    . BACK SURGERY  2012   lower back  . CARDIAC CATHETERIZATION  06/1999   noncritical disease invovling PDA  . CARDIAC CATHETERIZATION N/A 09/14/2014   Procedure: Left Heart Cath and Coronary Angiography;  Surgeon: Leonie Man, MD;  Location: [place] CV  LAB CUPID;  Service: Cardiovascular;  Laterality: N/A;  . CHOLECYSTECTOMY    . ESOPHAGOGASTRODUODENOSCOPY (EGD) WITH PROPOFOL Left 07/19/2017   Procedure: ESOPHAGOGASTRODUODENOSCOPY (EGD) WITH PROPOFOL;  Surgeon: Ronnette Juniper, MD;  [facility];  Service: Gastroenterology;  Laterality: Left;  . FRACTURE SURGERY Left 05/2017   left wrist  . HARDWARE REMOVAL Left 11/07/2017   Procedure: LEFT WRIST HARDWARE REMOVAL;  Surgeon: Charlotte Crumb, MD;  Location: [place];  Service: Orthopedics;  Laterality: Left;  . KNEE SURGERY Bilateral 2001 & 2007  . NECK SURGERY  2012   2012  . PARTIAL GASTRECTOMY  2005   subtotal  . PERCUTANEOUS CORONARY STENT INTERVENTION (PCI-S)  09/14/2014   Procedure: Percutaneous Coronary Stent Intervention (Pci-S);  Surgeon: Leonie Man, MD;  Location: [facility] INVASIVE CV LAB CUPID;  Service: Cardiovascular;;  . SHOULDER SURGERY Right   . TRANSTHORACIC ECHOCARDIOGRAM   06/02/2010   EF=>55%, normal LV systolic function; normal RV systolic function; mild mitral annular calcif; trace TR; AV mildly sclerotic  . WRIST OSTEOTOMY Left 11/07/2017   Procedure: LEFT WRIST DISTAL ULNA RESECTION WITH EXTENSOR CARPI ULNARIS STABILIZATION;  Surgeon: Charlotte Crumb, MD;  Location: [place];  Service: Orthopedics;  Laterality: Left;   Past Medical History:  Diagnosis Date  . Acute kidney injury ([place])   . Back pain   . Cervical cancer ([place])   . CHF (congestive heart failure) ([place])   . COPD (chronic obstructive pulmonary disease) ([place])   . Coronary artery disease    s/p DES x2 to RCA 2016  . GERD (gastroesophageal reflux disease)   . Hyperlipidemia   . Hypertension   . Myocardial infarction ([place])   . Neck pain   . Rhabdomyolysis   . Stomach problems    BP 109/68   Pulse 61   Temp 98.3 F (36.8 C)   Resp 16   Ht 5\' 5"  (1.651 m)   Wt 135 lb (61.2 kg)   BMI 22.47 kg/m   Opioid Risk Score:   Fall Risk Score:  `1  Depression screen PHQ 2/9  Depression screen [facility] 2/9 12/05/2018 09/02/2018 06/03/2018 03/12/2018 02/05/2015 08/06/2014  Decreased Interest 0 0 0 0 0 0  Down, Depressed, Hopeless 0 0 0 0 0 0  PHQ - 2 Score 0 0 0 0 0 0  Altered sleeping 0 - - - 0 1  Tired, decreased energy 0 - - - 1 2  Change in appetite 0 - - - 0 1  Feeling bad or failure about yourself  0 - - - 0 0  Trouble concentrating 0 - - - 1 0  Moving slowly or fidgety/restless 0 - - - 0 0  Suicidal thoughts 0 - - - 0 0  PHQ-9 Score 0 - - - 2 4     Review of Systems  Constitutional: Positive for appetite change and unexpected weight change.  HENT: Negative.   Eyes: Negative.   Respiratory: Negative.   Cardiovascular: Negative.   Gastrointestinal: Negative.   Endocrine: Negative.   Genitourinary: Positive for dysuria.  Musculoskeletal: Positive for gait problem.  Skin: Negative.   Allergic/Immunologic: Negative.   Neurological: Positive for dizziness and weakness.  Hematological:  Negative.   Psychiatric/Behavioral: Positive for confusion.  All other systems reviewed and are negative.      Objective:   Physical Exam Vitals signs and nursing note reviewed.  Constitutional:      Appearance: Normal appearance.  Eyes:     Extraocular Movements: Extraocular movements intact.  Conjunctiva/sclera: Conjunctivae normal.     Pupils: Pupils are equal, round, and reactive to light.  Musculoskeletal:     Lumbar back: She exhibits decreased range of motion. She exhibits no tenderness, no deformity and no spasm.     Comments: No tenderness to palpation in lumbar spine    Feet:     Right foot:     Skin integrity: Skin integrity normal.     Toenail Condition: Right toenails are abnormally thick and long.     Left foot:     Skin integrity: Skin integrity normal.     Toenail Condition: Left toenails are abnormally thick and long.     Comments: Sensation reduced in stocking pattern in feet, normalizes at ankles  Neurological:     Mental Status: She is alert.  Psychiatric:        Mood and Affect: Mood normal.        Behavior: Behavior normal.    Normal strength bilateral HF, KE, ADF and PF  Gait wide based no toe drag, unable to perform tandem gait        Assessment & Plan:  1.  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  Gait imbalance with reduced sensation in feet, exam c/w peripheral neuropathy no hx DM or ETOH, may further eval with EMG , pt will think about it  F/u podiatry for toenail care

## 2019-01-02 NOTE — Patient Instructions (Signed)
Stumbling and pinprick abnormalities in your feet may be a sign of neuropathy a test called EMG can help diagnose this condition  We do that test here, you can call to make appt for this  Please go to your podiatrist for toenail trimming

## 2019-01-14 DIAGNOSIS — D631 Anemia in chronic kidney disease: Secondary | ICD-10-CM | POA: Diagnosis not present

## 2019-01-14 DIAGNOSIS — N2581 Secondary hyperparathyroidism of renal origin: Secondary | ICD-10-CM | POA: Diagnosis not present

## 2019-01-14 DIAGNOSIS — N183 Chronic kidney disease, stage 3 (moderate): Secondary | ICD-10-CM | POA: Diagnosis not present

## 2019-01-14 DIAGNOSIS — I129 Hypertensive chronic kidney disease with stage 1 through stage 4 chronic kidney disease, or unspecified chronic kidney disease: Secondary | ICD-10-CM | POA: Diagnosis not present

## 2019-01-27 ENCOUNTER — Other Ambulatory Visit: Payer: Self-pay | Admitting: Internal Medicine

## 2019-01-27 ENCOUNTER — Telehealth: Payer: Self-pay

## 2019-01-27 DIAGNOSIS — R32 Unspecified urinary incontinence: Secondary | ICD-10-CM

## 2019-01-27 NOTE — Telephone Encounter (Signed)
Patient called asking about a referral to a Podiatrist. According to Dr. Letta Pate last note he recommended that she sees a Podiatrist for toenail trimming. I advised pt to call PCP to get a referral.

## 2019-02-10 ENCOUNTER — Encounter: Payer: Medicare Other | Attending: Physical Medicine & Rehabilitation | Admitting: Physical Medicine & Rehabilitation

## 2019-02-10 ENCOUNTER — Other Ambulatory Visit: Payer: Self-pay

## 2019-02-10 VITALS — BP 134/76 | HR 58 | Temp 97.5°F | Ht 64.0 in | Wt 139.0 lb

## 2019-02-10 DIAGNOSIS — M533 Sacrococcygeal disorders, not elsewhere classified: Secondary | ICD-10-CM | POA: Insufficient documentation

## 2019-02-10 DIAGNOSIS — G894 Chronic pain syndrome: Secondary | ICD-10-CM | POA: Diagnosis not present

## 2019-02-10 DIAGNOSIS — Z5181 Encounter for therapeutic drug level monitoring: Secondary | ICD-10-CM | POA: Diagnosis not present

## 2019-02-10 DIAGNOSIS — L602 Onychogryphosis: Secondary | ICD-10-CM | POA: Diagnosis not present

## 2019-02-10 DIAGNOSIS — Z79891 Long term (current) use of opiate analgesic: Secondary | ICD-10-CM

## 2019-02-10 DIAGNOSIS — R209 Unspecified disturbances of skin sensation: Secondary | ICD-10-CM | POA: Insufficient documentation

## 2019-02-10 NOTE — Progress Notes (Signed)
78 year old female with history of foot pain. She had been seen for chronic low back pain and chronic cervical pain as well has been on chronic tramadol, takes about 4 tablets/day on most days  At last visit on 01/02/2019 she was complaining of numbness in the feet History of diabetes.  No alcohol abuse history.  She no longer has complaints of numbness in her feet  She does have thick toenails  Examination General no acute distress new Mood and affect appropriate Lower extremity strength is 5/5 hip flexor knee extensor ankle dorsiflexor bilaterally Dorsalis pedis pulse is normal bilaterally Feet are warm Sensation is normal to light touch as well as proprioception in bilateral feet as well as ankle area. Negative straight leg raising test Ambulates with a cane no evidence of toe drag or knee instability Toenails are thickened.  She has had large toenail removal in past  Impression Chronic low back pain with history of sacroiliac fusion. Chronic cervical pain, postlaminectomy syndrome  Continue tramadol 1 to 2 tablets up to 3 times per day.  140 tablets/month  2.  History of foot numbness but this has resolved, she has received new shoe wear.  At this point I think we could hold off on the EMG/NCV  3.  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

## 2019-02-11 ENCOUNTER — Ambulatory Visit: Payer: Medicare Other | Admitting: Nurse Practitioner

## 2019-02-13 LAB — DRUG TOX MONITOR 1 W/CONF, ORAL FLD
Amphetamines: NEGATIVE ng/mL (ref <10)
Barbiturates: NEGATIVE ng/mL (ref <10)
Benzodiazepines: NEGATIVE ng/mL (ref <0.50)
Buprenorphine: NEGATIVE ng/mL (ref <0.10)
Cocaine: NEGATIVE ng/mL (ref <5.0)
Cotinine: 71 ng/mL — ABNORMAL HIGH (ref <5.0)
Fentanyl: NEGATIVE ng/mL (ref <0.10)
Heroin Metabolite: NEGATIVE ng/mL (ref <1.0)
MARIJUANA: NEGATIVE ng/mL (ref <2.5)
MDMA: NEGATIVE ng/mL (ref <10)
Meprobamate: NEGATIVE ng/mL (ref <2.5)
Methadone: NEGATIVE ng/mL (ref <5.0)
Nicotine Metabolite: POSITIVE ng/mL — AB (ref <5.0)
Opiates: NEGATIVE ng/mL (ref <2.5)
Phencyclidine: NEGATIVE ng/mL (ref <10)
Tapentadol: NEGATIVE ng/mL (ref <5.0)
Zolpidem: NEGATIVE ng/mL (ref <5.0)

## 2019-02-13 LAB — DRUG TOX ALC METAB W/CON, ORAL FLD: Alcohol Metabolite: NEGATIVE ng/mL (ref <25)

## 2019-02-17 ENCOUNTER — Telehealth: Payer: Self-pay | Admitting: *Deleted

## 2019-02-17 NOTE — Telephone Encounter (Signed)
Oral swab for 02/10/19 could not be determined due to some unknown interference with tramadol quantification. It is doubtful that it was present as her pill count was #140 filled on 01/26/19 and it was #140 at count on 02/10/19.   Dr Letta Pate is aware.

## 2019-02-18 ENCOUNTER — Ambulatory Visit: Payer: Medicare Other | Admitting: Sports Medicine

## 2019-02-18 ENCOUNTER — Encounter: Payer: Self-pay | Admitting: Sports Medicine

## 2019-02-18 ENCOUNTER — Other Ambulatory Visit: Payer: Self-pay

## 2019-02-18 DIAGNOSIS — B351 Tinea unguium: Secondary | ICD-10-CM | POA: Diagnosis not present

## 2019-02-18 DIAGNOSIS — M79675 Pain in left toe(s): Secondary | ICD-10-CM

## 2019-02-18 DIAGNOSIS — R413 Other amnesia: Secondary | ICD-10-CM | POA: Insufficient documentation

## 2019-02-18 DIAGNOSIS — L84 Corns and callosities: Secondary | ICD-10-CM | POA: Diagnosis not present

## 2019-02-18 DIAGNOSIS — R5383 Other fatigue: Secondary | ICD-10-CM | POA: Insufficient documentation

## 2019-02-18 DIAGNOSIS — M79674 Pain in right toe(s): Secondary | ICD-10-CM

## 2019-02-18 MED ORDER — CICLOPIROX 8 % EX SOLN: Freq: Every day | CUTANEOUS | 0 refills | Status: DC

## 2019-02-18 NOTE — Progress Notes (Signed)
Subjective: Sara Fox is a 78 y.o. female patient seen today in office with complaint of mildly painful corn and thickened and elongated toenails; unable to trim. Patient denies history of Diabetes, Neuropathy, or Vascular disease. Patient has no other pedal complaints at this time.   Review of Systems  All other systems reviewed and are negative.    Patient Active Problem List   Diagnosis Date Noted  . Fatigue 02/18/2019  . Memory loss 02/18/2019  . Vitamin B12 deficiency 10/23/2018  . Hypotension 10/23/2018  . Stage 3 chronic kidney disease 06/03/2018  . Chronic anemia 06/03/2018  . Hypercholesterolemia 06/03/2018  . Malnutrition of moderate degree 07/12/2017  . Hypoalbuminemia 07/11/2017  . Elevated LFTs 07/11/2017  . Closed fracture of left distal radius 05/28/2017  . Snoring 12/21/2016  . OSA and COPD overlap syndrome ([place]) 12/21/2016  . Peripheral musculoskeletal gait disorder 02/05/2015  . Lumbar post-laminectomy syndrome 02/05/2015  . CAD in native artery 10/16/2014  . NSTEMI (non-ST elevated myocardial infarction) ([place]) 09/13/2014  . Cardiomyopathy, ischemic 09/13/2014  . Chronic systolic heart failure ([place])   . Tobacco abuse 09/12/2014  . Dyslipidemia 09/12/2014  . CKD (chronic kidney disease), stage II 09/12/2014  . Normocytic anemia 09/12/2014  . Chronic diastolic heart failure, NYHA class 1 ([place]) 09/12/2014  . Postlaminectomy syndrome, cervical region 08/01/2013  . Chronic midline low back pain with left-sided sciatica 08/01/2013  . Shoulder joint contracture 08/01/2013  . HTN (hypertension) 03/03/2013  . Abnormal genetic test 03/03/2013    Current Outpatient Medications on File Prior to Visit  Medication Sig Dispense Refill  . aspirin EC 81 MG tablet Take 81 mg by mouth daily.    . budesonide-formoterol (SYMBICORT) 160-4.5 MCG/ACT inhaler Inhale 2 puffs into the lungs 2 (two) times daily.    [place] Kitchen CALCIUM CITRATE PO Take 1 tablet by mouth daily.     .  Cholecalciferol (VITAMIN D3) 2000 UNITS capsule Take 2,000 Units by mouth daily.    . furosemide (LASIX) 20 MG tablet TAKE 1 TABLET BY MOUTH EVERY OTHER DAY 45 tablet 0  . gabapentin (NEURONTIN) 100 MG capsule Take 100 mg by mouth daily.     . metoprolol tartrate (LOPRESSOR) 25 MG tablet TAKE 1 TABLET BY MOUTH TWICE DAILY WITH  A  MEAL. 180 tablet 1  . Multiple Vitamin (MULTIVITAMIN) capsule Take 1 capsule by mouth daily. Contains iron    . nitroGLYCERIN (NITROSTAT) 0.4 MG SL tablet Place 1 tablet (0.4 mg total) under the tongue every 5 (five) minutes as needed for chest pain. 25 tablet 3  . oxybutynin (DITROPAN) 5 MG tablet Take 1/2 (one-half) tablet by mouth twice daily 30 tablet 0  . pantoprazole (PROTONIX) 40 MG tablet Take 1 tablet by mouth twice daily 180 tablet 1  . polyethylene glycol (MIRALAX / GLYCOLAX) packet Take 17 g by mouth daily as needed for mild constipation.     . Potassium Chloride ER 20 MEQ TBCR TAKE 2 TABLETS BY MOUTH IN THE MORNING 60 tablet 11  . rosuvastatin (CRESTOR) 40 MG tablet Take 1 tablet (40 mg total) by mouth daily. 30 tablet 11  . traMADol (ULTRAM) 50 MG tablet TAKE 1-2 TABLETS BY MOUTH THREE TIMES DAILY. Do Not Fill this prescription until [facility] for refill. Do Not Fill Before 10/08/2018 140 tablet 5  . vitamin C (ASCORBIC ACID) 500 MG tablet Take 500 mg by mouth daily.     No current facility-administered medications on file prior to visit.  Allergies  Allergen Reactions  . Buprenorphine Hcl Shortness Of Breath    Throat swelling/trouble breathing and lethargic  . Morphine And Related Shortness Of Breath    Throat swelling/trouble breathing and lethargic  . Celebrex [Celecoxib] Diarrhea and Swelling    Swelling of legs   . Vioxx [Rofecoxib] Palpitations  . Codeine Other (See Comments)    Bloated     Objective: Physical Exam  General: Well developed, nourished, no acute distress, awake, alert and oriented x 3  Vascular: Dorsalis pedis  artery 2/4 bilateral, Posterior tibial artery 2/4 bilateral, skin temperature warm to warm proximal to distal bilateral lower extremities, no varicosities, pedal hair present bilateral.  Neurological: Gross sensation present via light touch bilateral.   Dermatological: Skin is warm, dry, and supple bilateral, Nails 1-10 are tender, long, thick, and discolored with mild subungal debris, no webspace macerations present bilateral, no open lesions present bilateral, + corn/hyperkeratotic tissue present right 3rd toe. No signs of infection bilateral.  Musculoskeletal: Asymptomatic hammertoe boney deformities noted bilateral. Muscular strength within normal limits without painon range of motion. No pain with calf compression bilateral.  Assessment and Plan:  Problem List Items Addressed This Visit    None    Visit Diagnoses    Pain due to onychomycosis of toenails of both feet    -  Primary   Relevant Medications   ciclopirox (PENLAC) 8 % solution   Corns and callosities          -Examined patient.  -Discussed treatment options for painful mycotic nails/corn. -Mechanically debrided and reduced mycotic nails with sterile nail nipper and dremel nail file without incident. -At no charge trimmed corn of toe at right 3rd toe -Rx Penlac -Patient to return in 3 months for follow up evaluation or sooner if symptoms worsen.  Landis Martins, DPM

## 2019-02-22 ENCOUNTER — Other Ambulatory Visit: Payer: Self-pay | Admitting: Nurse Practitioner

## 2019-02-22 ENCOUNTER — Other Ambulatory Visit: Payer: Self-pay | Admitting: Internal Medicine

## 2019-02-22 DIAGNOSIS — R32 Unspecified urinary incontinence: Secondary | ICD-10-CM

## 2019-03-11 ENCOUNTER — Ambulatory Visit (INDEPENDENT_AMBULATORY_CARE_PROVIDER_SITE_OTHER): Payer: Medicare Other | Admitting: Nurse Practitioner

## 2019-03-11 ENCOUNTER — Other Ambulatory Visit: Payer: Self-pay

## 2019-03-11 ENCOUNTER — Encounter: Payer: Self-pay | Admitting: Nurse Practitioner

## 2019-03-11 VITALS — BP 116/64 | HR 71 | Temp 97.8°F | Ht 65.0 in | Wt 141.4 lb

## 2019-03-11 DIAGNOSIS — I130 Hypertensive heart and chronic kidney disease with heart failure and stage 1 through stage 4 chronic kidney disease, or unspecified chronic kidney disease: Secondary | ICD-10-CM | POA: Diagnosis not present

## 2019-03-11 DIAGNOSIS — E538 Deficiency of other specified B group vitamins: Secondary | ICD-10-CM

## 2019-03-11 DIAGNOSIS — I509 Heart failure, unspecified: Secondary | ICD-10-CM | POA: Diagnosis not present

## 2019-03-11 DIAGNOSIS — F17209 Nicotine dependence, unspecified, with unspecified nicotine-induced disorders: Secondary | ICD-10-CM

## 2019-03-11 DIAGNOSIS — R413 Other amnesia: Secondary | ICD-10-CM | POA: Diagnosis not present

## 2019-03-11 DIAGNOSIS — R209 Unspecified disturbances of skin sensation: Secondary | ICD-10-CM

## 2019-03-11 MED ORDER — CYANOCOBALAMIN 1000 MCG/ML IJ SOLN
1000.0000 ug | Freq: Once | INTRAMUSCULAR | Status: AC
  Administered 2019-03-11: 1000 ug via INTRAMUSCULAR

## 2019-03-11 NOTE — Progress Notes (Signed)
Subjective:     Patient ID: Sara Fox , female    DOB: October 15, 1940 , 78 y.o.   MRN: [id number]   Chief Complaint  Patient presents with  . Hypertension  . B12 Injection    HPI  Hypertension This is a chronic problem. The current episode started more than 1 year ago. The problem has been gradually improving since onset. The problem is controlled. Pertinent negatives include no blurred vision, chest pain, palpitations or shortness of breath.     Past Medical History:  Diagnosis Date  . Acute kidney injury ([place])   . Back pain   . Cervical cancer ([place])   . CHF (congestive heart failure) ([place])   . COPD (chronic obstructive pulmonary disease) ([place])   . Coronary artery disease    s/p DES x2 to RCA 2016  . GERD (gastroesophageal reflux disease)   . Hyperlipidemia   . Hypertension   . Myocardial infarction ([place])   . Neck pain   . Rhabdomyolysis   . Stomach problems      Family History  Problem Relation Age of Onset  . Heart disease Mother        also HTN  . Diabetes Mother   . Colon cancer Mother   . Heart disease Brother        deceased at 58  . Hypertension Brother   . Heart attack Brother   . Heart disease Brother   . Hypertension Brother      Current Outpatient Medications:  .  aspirin EC 81 MG tablet, Take 81 mg by mouth daily., Disp: , Rfl:  .  budesonide-formoterol (SYMBICORT) 160-4.5 MCG/ACT inhaler, Inhale 2 puffs into the lungs 2 (two) times daily., Disp: , Rfl:  .  CALCIUM CITRATE PO, Take 1 tablet by mouth daily. , Disp: , Rfl:  .  Cholecalciferol (VITAMIN D3) 2000 UNITS capsule, Take 2,000 Units by mouth daily., Disp: , Rfl:  .  ciclopirox (PENLAC) 8 % solution, Apply topically at bedtime. Apply over nail and surrounding skin. Apply daily over previous coat. After seven (7) days, file nail and continue cycle., Disp: 6.6 mL, Rfl: 0 .  furosemide (LASIX) 20 MG tablet, TAKE 1 TABLET BY MOUTH EVERY OTHER DAY, Disp: 45 tablet, Rfl: 0 .  gabapentin  (NEURONTIN) 100 MG capsule, Take 100 mg by mouth daily. , Disp: , Rfl:  .  metoprolol tartrate (LOPRESSOR) 25 MG tablet, TAKE 1 TABLET BY MOUTH TWICE DAILY WITH A MEAL, Disp: 180 tablet, Rfl: 1 .  Multiple Vitamin (MULTIVITAMIN) capsule, Take 1 capsule by mouth daily. Contains iron, Disp: , Rfl:  .  nitroGLYCERIN (NITROSTAT) 0.4 MG SL tablet, Place 1 tablet (0.4 mg total) under the tongue every 5 (five) minutes as needed for chest pain., Disp: 25 tablet, Rfl: 3 .  oxybutynin (DITROPAN) 5 MG tablet, Take 1/2 (one-half) tablet by mouth twice daily, Disp: 30 tablet, Rfl: 0 .  pantoprazole (PROTONIX) 40 MG tablet, Take 1 tablet by mouth twice daily, Disp: 180 tablet, Rfl: 1 .  polyethylene glycol (MIRALAX / GLYCOLAX) packet, Take 17 g by mouth daily as needed for mild constipation. , Disp: , Rfl:  .  Potassium Chloride ER 20 MEQ TBCR, TAKE 2 TABLETS BY MOUTH IN THE MORNING, Disp: 60 tablet, Rfl: 11 .  rosuvastatin (CRESTOR) 40 MG tablet, Take 1 tablet (40 mg total) by mouth daily., Disp: 30 tablet, Rfl: 11 .  traMADol (ULTRAM) 50 MG tablet, TAKE 1-2 TABLETS BY MOUTH THREE TIMES DAILY. Do  Not Fill this prescription until [facility] for refill. Do Not Fill Before 10/08/2018, Disp: 140 tablet, Rfl: 5 .  vitamin C (ASCORBIC ACID) 500 MG tablet, Take 500 mg by mouth daily., Disp: , Rfl:    Allergies  Allergen Reactions  . Buprenorphine Hcl Shortness Of Breath    Throat swelling/trouble breathing and lethargic  . Morphine And Related Shortness Of Breath    Throat swelling/trouble breathing and lethargic  . Celebrex [Celecoxib] Diarrhea and Swelling    Swelling of legs   . Vioxx [Rofecoxib] Palpitations  . Codeine Other (See Comments)    Bloated      Review of Systems  Constitutional: Negative.   Eyes: Negative for blurred vision.  Respiratory: Negative.  Negative for shortness of breath.   Cardiovascular: Negative.  Negative for chest pain and palpitations.  Gastrointestinal: Negative.    Neurological: Negative.   Psychiatric/Behavioral: Negative.      Today's Vitals   03/11/19 1031  BP: 116/64  Pulse: 71  Temp: 97.8 F (36.6 C)  TempSrc: Oral  Weight: 141 lb 6.4 oz (64.1 kg)  Height: 5\' 5"  (1.651 m)  PainSc: 0-No pain   Body mass index is 23.53 kg/m.   Objective:  Physical Exam Vitals signs and nursing note reviewed.  Constitutional:      Appearance: Normal appearance.  HENT:     Head: Normocephalic and atraumatic.  Cardiovascular:     Rate and Rhythm: Normal rate and regular rhythm.     Heart sounds: Normal heart sounds.  Pulmonary:     Effort: Pulmonary effort is normal.     Breath sounds: Normal breath sounds.     Comments: Decreased breath sounds b/l Skin:    General: Skin is warm.  Neurological:     General: No focal deficit present.     Mental Status: She is alert.  Psychiatric:        Mood and Affect: Mood normal.        Behavior: Behavior normal.         Assessment And Plan:     1. 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 continue with current meds. She is encouraged to avoid adding salt to her foods.   2. Memory loss  Her son feels her memory is slightly declining since she has not had a vitamin B12 injection in 2 months. Will recheck levels and administer vitamin B12 today  3. Tobacco use disorder, continuous Chronic. She reports she is on nicotine gum and is down to 4-5 cigarettes per day   4. Vitamin B12 deficiency  Administered vitamin B12 today after obtaining a vitamin B12 injection - cyanocobalamin ((VITAMIN B-12)) injection 1,000 mcg  5. Cold sensation of skin  She is on brilinta which may cause some cold sensation  Will also check CBC and Iron, TIBC levels. - CBC with Differential/Platelet - Iron, TIBC and Ferritin Panel       [name], FNP    THE PATIENT IS ENCOURAGED TO PRACTICE SOCIAL DISTANCING DUE TO THE COVID-19 PANDEMIC.

## 2019-03-13 LAB — CMP14+EGFR
ALT: 14 IU/L (ref 0–32)
AST: 19 IU/L (ref 0–40)
Albumin/Globulin Ratio: 1.7 (ref 1.2–2.2)
Albumin: 4.2 g/dL (ref 3.7–4.7)
Alkaline Phosphatase: 106 IU/L (ref 39–117)
BUN/Creatinine Ratio: 12 (ref 12–28)
BUN: 15 mg/dL (ref 8–27)
Bilirubin Total: 0.2 mg/dL (ref 0.0–1.2)
CO2: 19 mmol/L — ABNORMAL LOW (ref 20–29)
Calcium: 9.9 mg/dL (ref 8.7–10.3)
Chloride: 105 mmol/L (ref 96–106)
Creatinine, Ser: 1.22 mg/dL — ABNORMAL HIGH (ref 0.57–1.00)
GFR calc Af Amer: 49 mL/min/{1.73_m2} — ABNORMAL LOW (ref >59)
GFR calc non Af Amer: 42 mL/min/{1.73_m2} — ABNORMAL LOW (ref >59)
Globulin, Total: 2.5 g/dL (ref 1.5–4.5)
Glucose: 73 mg/dL (ref 65–99)
Potassium: 4.6 mmol/L (ref 3.5–5.2)
Sodium: 138 mmol/L (ref 134–144)
Total Protein: 6.7 g/dL (ref 6.0–8.5)

## 2019-03-13 LAB — CBC WITH DIFFERENTIAL/PLATELET
Basophils Absolute: 0 10*3/uL (ref 0.0–0.2)
Basos: 0 %
EOS (ABSOLUTE): 0.1 10*3/uL (ref 0.0–0.4)
Eos: 1 %
Hematocrit: 35.5 % (ref 34.0–46.6)
Hemoglobin: 11.2 g/dL (ref 11.1–15.9)
Immature Grans (Abs): 0 10*3/uL (ref 0.0–0.1)
Immature Granulocytes: 0 %
Lymphocytes Absolute: 1.4 10*3/uL (ref 0.7–3.1)
Lymphs: 24 %
MCH: 30.7 pg (ref 26.6–33.0)
MCHC: 31.5 g/dL (ref 31.5–35.7)
MCV: 97 fL (ref 79–97)
Monocytes Absolute: 0.7 10*3/uL (ref 0.1–0.9)
Monocytes: 13 %
Neutrophils Absolute: 3.5 10*3/uL (ref 1.4–7.0)
Neutrophils: 62 %
Platelets: 280 10*3/uL (ref 150–450)
RBC: 3.65 x10E6/uL — ABNORMAL LOW (ref 3.77–5.28)
RDW: 11.7 % (ref 11.7–15.4)
WBC: 5.7 10*3/uL (ref 3.4–10.8)

## 2019-03-13 LAB — IRON,TIBC AND FERRITIN PANEL
Ferritin: 255 ng/mL — ABNORMAL HIGH (ref 15–150)
Iron Saturation: 18 % (ref 15–55)
Iron: 49 ug/dL (ref 27–139)
Total Iron Binding Capacity: 269 ug/dL (ref 250–450)
UIBC: 220 ug/dL (ref 118–369)

## 2019-03-13 LAB — VITAMIN B12: Vitamin B-12: 892 pg/mL (ref 232–1245)

## 2019-03-24 ENCOUNTER — Other Ambulatory Visit: Payer: Self-pay | Admitting: Internal Medicine

## 2019-03-24 DIAGNOSIS — R32 Unspecified urinary incontinence: Secondary | ICD-10-CM

## 2019-03-27 ENCOUNTER — Other Ambulatory Visit: Payer: Self-pay

## 2019-03-27 ENCOUNTER — Encounter: Payer: Self-pay | Admitting: Nurse Practitioner

## 2019-03-27 ENCOUNTER — Ambulatory Visit (INDEPENDENT_AMBULATORY_CARE_PROVIDER_SITE_OTHER): Payer: Medicare Other | Admitting: Nurse Practitioner

## 2019-03-27 VITALS — BP 110/60 | HR 63 | Temp 98.7°F | Ht 64.4 in | Wt 140.8 lb

## 2019-03-27 DIAGNOSIS — D519 Vitamin B12 deficiency anemia, unspecified: Secondary | ICD-10-CM | POA: Diagnosis not present

## 2019-03-27 MED ORDER — CYANOCOBALAMIN 1000 MCG/ML IJ SOLN
1000.0000 ug | Freq: Once | INTRAMUSCULAR | Status: AC
  Administered 2019-03-27: 1000 ug via INTRAMUSCULAR

## 2019-03-27 NOTE — Progress Notes (Signed)
Subjective:     Patient ID: Sara Fox , female    DOB: 1941-01-08 , 78 y.o.   MRN: [id number]   Chief Complaint  Patient presents with  . B12 Injection    HPI  She feels the vitamin B12 injections have helped her significantly with her fatigue.      Past Medical History:  Diagnosis Date  . Acute kidney injury ([place])   . Back pain   . Cervical cancer ([place])   . CHF (congestive heart failure) ([place])   . COPD (chronic obstructive pulmonary disease) ([place])   . Coronary artery disease    s/p DES x2 to RCA 2016  . GERD (gastroesophageal reflux disease)   . Hyperlipidemia   . Hypertension   . Myocardial infarction ([place])   . Neck pain   . Rhabdomyolysis   . Stomach problems      Family History  Problem Relation Age of Onset  . Heart disease Mother        also HTN  . Diabetes Mother   . Colon cancer Mother   . Heart disease Brother        deceased at 35  . Hypertension Brother   . Heart attack Brother   . Heart disease Brother   . Hypertension Brother      Current Outpatient Medications:  .  aspirin EC 81 MG tablet, Take 81 mg by mouth daily., Disp: , Rfl:  .  budesonide-formoterol (SYMBICORT) 160-4.5 MCG/ACT inhaler, Inhale 2 puffs into the lungs 2 (two) times daily., Disp: , Rfl:  .  CALCIUM CITRATE PO, Take 1 tablet by mouth daily. , Disp: , Rfl:  .  Cholecalciferol (VITAMIN D3) 2000 UNITS capsule, Take 2,000 Units by mouth daily., Disp: , Rfl:  .  ciclopirox (PENLAC) 8 % solution, Apply topically at bedtime. Apply over nail and surrounding skin. Apply daily over previous coat. After seven (7) days, file nail and continue cycle., Disp: 6.6 mL, Rfl: 0 .  furosemide (LASIX) 20 MG tablet, TAKE 1 TABLET BY MOUTH EVERY OTHER DAY, Disp: 45 tablet, Rfl: 0 .  gabapentin (NEURONTIN) 100 MG capsule, Take 100 mg by mouth daily. , Disp: , Rfl:  .  metoprolol tartrate (LOPRESSOR) 25 MG tablet, TAKE 1 TABLET BY MOUTH TWICE DAILY WITH A MEAL, Disp: 180 tablet, Rfl: 1 .  Multiple  Vitamin (MULTIVITAMIN) capsule, Take 1 capsule by mouth daily. Contains iron, Disp: , Rfl:  .  nitroGLYCERIN (NITROSTAT) 0.4 MG SL tablet, Place 1 tablet (0.4 mg total) under the tongue every 5 (five) minutes as needed for chest pain., Disp: 25 tablet, Rfl: 3 .  oxybutynin (DITROPAN) 5 MG tablet, Take 1/2 (one-half) tablet by mouth twice daily, Disp: 30 tablet, Rfl: 0 .  pantoprazole (PROTONIX) 40 MG tablet, Take 1 tablet by mouth twice daily, Disp: 180 tablet, Rfl: 1 .  polyethylene glycol (MIRALAX / GLYCOLAX) packet, Take 17 g by mouth daily as needed for mild constipation. , Disp: , Rfl:  .  Potassium Chloride ER 20 MEQ TBCR, TAKE 2 TABLETS BY MOUTH IN THE MORNING, Disp: 60 tablet, Rfl: 11 .  rosuvastatin (CRESTOR) 40 MG tablet, Take 1 tablet (40 mg total) by mouth daily., Disp: 30 tablet, Rfl: 11 .  traMADol (ULTRAM) 50 MG tablet, TAKE 1-2 TABLETS BY MOUTH THREE TIMES DAILY. Do Not Fill this prescription until [facility] for refill. Do Not Fill Before 10/08/2018, Disp: 140 tablet, Rfl: 5 .  vitamin C (ASCORBIC ACID) 500 MG tablet,  Take 500 mg by mouth daily., Disp: , Rfl:    Allergies  Allergen Reactions  . Buprenorphine Hcl Shortness Of Breath    Throat swelling/trouble breathing and lethargic  . Morphine And Related Shortness Of Breath    Throat swelling/trouble breathing and lethargic  . Celebrex [Celecoxib] Diarrhea and Swelling    Swelling of legs   . Vioxx [Rofecoxib] Palpitations  . Codeine Other (See Comments)    Bloated      Review of Systems  Constitutional: Negative.   Respiratory: Negative.   Cardiovascular: Negative.   Gastrointestinal: Negative.   Neurological: Negative.   Psychiatric/Behavioral: Negative.      Today's Vitals   03/27/19 0850  BP: 110/60  Pulse: 63  Temp: 98.7 F (37.1 C)  TempSrc: Oral  Weight: 140 lb 12.8 oz (63.9 kg)  Height: 5' 4.4" (1.636 m)  PainSc: 0-No pain   Body mass index is 23.87 kg/m.   Objective:  Physical  Exam Vitals signs and nursing note reviewed.  Constitutional:      Appearance: Normal appearance.  HENT:     Head: Normocephalic and atraumatic.  Cardiovascular:     Rate and Rhythm: Normal rate and regular rhythm.     Heart sounds: Normal heart sounds.  Pulmonary:     Effort: Pulmonary effort is normal.     Breath sounds: Normal breath sounds.     Comments: Decreased breath sounds b/l Skin:    General: Skin is warm.  Neurological:     General: No focal deficit present.     Mental Status: She is alert.  Psychiatric:        Mood and Affect: Mood normal.        Behavior: Behavior normal.         Assessment And Plan:    .  1. Anemia due to vitamin B12 deficiency, unspecified B12 deficiency type  She is doing well when she takes her vitamin B12 injection has been beneficial with her fatigue.  Administered vitamin B12 today after obtaining a vitamin B12 injection - cyanocobalamin ((VITAMIN B-12)) injection 1,000 mcg      [name], FNP    THE PATIENT IS ENCOURAGED TO PRACTICE SOCIAL DISTANCING DUE TO THE COVID-19 PANDEMIC.

## 2019-03-31 ENCOUNTER — Ambulatory Visit (INDEPENDENT_AMBULATORY_CARE_PROVIDER_SITE_OTHER): Payer: Medicare Other | Admitting: Internal Medicine

## 2019-03-31 ENCOUNTER — Other Ambulatory Visit: Payer: Self-pay

## 2019-03-31 ENCOUNTER — Encounter: Payer: Self-pay | Admitting: Internal Medicine

## 2019-03-31 VITALS — BP 151/80 | HR 63 | Temp 97.2°F | Ht 64.0 in | Wt 144.6 lb

## 2019-03-31 DIAGNOSIS — E785 Hyperlipidemia, unspecified: Secondary | ICD-10-CM

## 2019-03-31 DIAGNOSIS — I2510 Atherosclerotic heart disease of native coronary artery without angina pectoris: Secondary | ICD-10-CM

## 2019-03-31 DIAGNOSIS — I10 Essential (primary) hypertension: Secondary | ICD-10-CM

## 2019-03-31 NOTE — Progress Notes (Signed)
OFFICE NOTE  Chief Complaint:  Occasional chest pain  Primary Care Physician: [name], FNP  HPI:  Sara Fox is a 78 year old female with history of multiple surgeries in the past including knee replacement, shoulder surgery, subtotal gastrectomy, and recently she underwent back surgery. She has actually done very well with all these surgeries; has had no cardiac complications, MIs, or any other significant issues. She did have an echocardiogram which showed an EF of 55% recently with mild diastolic dysfunction. There are family risk factors for coronary disease, but she is on appropriate medications including aspirin and a statin. Recently she underwent a Corus gene test in your office which was abnormal, demonstrating a score of 20, which indicates a 30% likelihood of obstructive coronary disease. She remains asymptomatic from a cardiac standpoint and I did not feel additional work-up was necessary.  Her main concern today is caring for her husband who has been pretty sick. She reports her blood pressures been under good control. She is currently taking lovastatin and her cholesterol is at goal.  She does report a trace amount of lower extremity edema from time to time.  I saw Sara Fox back today. On fortunately she presented to the hospital with chest pain and was found to have non-STEMI. She underwent cardiac catheterization by Dr. Ellyn Hack in had a severe mid to distal RCA stenosis. She had a placement of a long 33 mm drug-eluting stent which was designed to [place]. She tolerated this well his had no further chest pain. She is currently on aspirin and Brilinta. She reports taking her medications regularly. She does feel that she gets a little bit dry mouth and may be a little dehydrated. She is on Lasix 20 mg daily but was found to have a mildly reduced EF about 45%.  04/12/2016  Sara Fox returns today for follow-up. Overall she is feeling very well. She denies any chest pain  or worsening shortness of breath. She's had about 11 pound weight loss and has been working with physical medicine and rehabilitation for ongoing treatment of chronic pain. She denies any worsening chest pain or shortness of breath. Her stent was placed last more than one year ago. She has been on aspirin and Brilinta. I decreased her Lasix at her last office visit and increased her lisinopril. Blood pressure is fairly well-controlled.  12/04/2017  Sara Fox returns today for follow-up.  She reports 2 episodes of chest pain last month.  In June she underwent removal of some hardware from her left wrist.  She had been doing well but then developed left-sided chest discomfort.  Interestingly she still on Brilinta after PCI to the right coronary artery in 2016.  Apparently according to her son, she developed swelling with coming off of Brilinta and was restarted on the swelling disappeared.  I am never heard of this and I am concerned about long-term use of Brilinta regarding bleeding risk.  With regards to the chest pain the 2 episodes were slightly different.  The first episode was at rest and relieved after 2 nitroglycerin.  Second episode was associated with some indigestion and improved after belching.  She is noted to have residual 50% LAD stenosis on cath in 2016.  03/31/2019  Sara Fox is seen today in follow-up.  I spoke with her son via telephone during the visit due to office restrictions for visitation.  He reports that she has had a couple episodes of chest discomfort which came on after raking leaves.  She stopped because it did not immediately resolve she took a nitro.  Ultimately the pain did resolve.  She also had one episode of extreme fatigue for a day where she basically slept all day.  Is not clear what this was related to.  She has had some intermittent UTIs.  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  At her most recent virtual visit, it was noted that she was off of her statins.  Her  cholesterol is much higher.  This is been restarted and her most recent lipids were improved.  Total cholesterol now 144, triglycerides 109, HDL 43 and LDL 76.  PMHx:  Past Medical History:  Diagnosis Date  . Acute kidney injury ([place])   . Back pain   . Cervical cancer ([place])   . CHF (congestive heart failure) ([place])   . COPD (chronic obstructive pulmonary disease) ([place])   . Coronary artery disease    s/p DES x2 to RCA 2016  . GERD (gastroesophageal reflux disease)   . Hyperlipidemia   . Hypertension   . Myocardial infarction ([place])   . Neck pain   . Rhabdomyolysis   . Stomach problems     Past Surgical History:  Procedure Laterality Date  . ABDOMINAL HYSTERECTOMY    . BACK SURGERY  2012   lower back  . CARDIAC CATHETERIZATION  06/1999   noncritical disease invovling PDA  . CARDIAC CATHETERIZATION N/A 09/14/2014   Procedure: Left Heart Cath and Coronary Angiography;  Surgeon: Leonie Man, MD;  Location: [facility] INVASIVE CV LAB CUPID;  Service: Cardiovascular;  Laterality: N/A;  . CHOLECYSTECTOMY    . ESOPHAGOGASTRODUODENOSCOPY (EGD) WITH PROPOFOL Left 07/19/2017   Procedure: ESOPHAGOGASTRODUODENOSCOPY (EGD) WITH PROPOFOL;  Surgeon: Ronnette Juniper, MD;  Location: [facility] ENDOSCOPY;  Service: Gastroenterology;  Laterality: Left;  . FRACTURE SURGERY Left 05/2017   left wrist  . HARDWARE REMOVAL Left 11/07/2017   Procedure: LEFT WRIST HARDWARE REMOVAL;  Surgeon: Charlotte Crumb, MD;  Location: [place];  Service: Orthopedics;  Laterality: Left;  . KNEE SURGERY Bilateral 2001 & 2007  . NECK SURGERY  2012   2012  . PARTIAL GASTRECTOMY  2005   subtotal  . PERCUTANEOUS CORONARY STENT INTERVENTION (PCI-S)  09/14/2014   Procedure: Percutaneous Coronary Stent Intervention (Pci-S);  Surgeon: Leonie Man, MD;  Location: [facility] INVASIVE CV LAB CUPID;  Service: Cardiovascular;;  . SHOULDER SURGERY Right   . TRANSTHORACIC ECHOCARDIOGRAM  06/02/2010   EF=>55%, normal LV systolic function; normal RV systolic  function; mild mitral annular calcif; trace TR; AV mildly sclerotic  . WRIST OSTEOTOMY Left 11/07/2017   Procedure: LEFT WRIST DISTAL ULNA RESECTION WITH EXTENSOR CARPI ULNARIS STABILIZATION;  Surgeon: Charlotte Crumb, MD;  Location: [place];  Service: Orthopedics;  Laterality: Left;    FAMHx:  Family History  Problem Relation Age of Onset  . Heart disease Mother        also HTN  . Diabetes Mother   . Colon cancer Mother   . Heart disease Brother        deceased at 28  . Hypertension Brother   . Heart attack Brother   . Heart disease Brother   . Hypertension Brother     SOCHx:   reports that she has been smoking. She has a 30.00 pack-year smoking history. She has never used smokeless tobacco. She reports that she does not drink alcohol or use drugs.  ALLERGIES:  Allergies  Allergen Reactions  . Buprenorphine Hcl Shortness Of Breath  Throat swelling/trouble breathing and lethargic  . Morphine And Related Shortness Of Breath    Throat swelling/trouble breathing and lethargic  . Celebrex [Celecoxib] Diarrhea and Swelling    Swelling of legs   . Vioxx [Rofecoxib] Palpitations  . Codeine Other (See Comments)    Bloated     ROS: Pertinent items noted in HPI and remainder of comprehensive ROS otherwise negative.  HOME MEDS: Current Outpatient Medications  Medication Sig Dispense Refill  . aspirin EC 81 MG tablet Take 81 mg by mouth daily.    . budesonide-formoterol (SYMBICORT) 160-4.5 MCG/ACT inhaler Inhale 2 puffs into the lungs 2 (two) times daily.    Marland Kitchen CALCIUM CITRATE PO Take 1 tablet by mouth daily.     . Cholecalciferol (VITAMIN D3) 2000 UNITS capsule Take 2,000 Units by mouth daily.    . ciclopirox (PENLAC) 8 % solution Apply topically at bedtime. Apply over nail and surrounding skin. Apply daily over previous coat. After seven (7) days, file nail and continue cycle. 6.6 mL 0  . furosemide (LASIX) 20 MG tablet TAKE 1 TABLET BY MOUTH EVERY OTHER DAY 45 tablet 0  .  gabapentin (NEURONTIN) 100 MG capsule Take 100 mg by mouth daily.     . metoprolol tartrate (LOPRESSOR) 25 MG tablet TAKE 1 TABLET BY MOUTH TWICE DAILY WITH A MEAL 180 tablet 1  . Multiple Vitamin (MULTIVITAMIN) capsule Take 1 capsule by mouth daily. Contains iron    . nitroGLYCERIN (NITROSTAT) 0.4 MG SL tablet Place 1 tablet (0.4 mg total) under the tongue every 5 (five) minutes as needed for chest pain. 25 tablet 3  . oxybutynin (DITROPAN) 5 MG tablet Take 1/2 (one-half) tablet by mouth twice daily 30 tablet 0  . pantoprazole (PROTONIX) 40 MG tablet Take 1 tablet by mouth twice daily 180 tablet 1  . polyethylene glycol (MIRALAX / GLYCOLAX) packet Take 17 g by mouth daily as needed for mild constipation.     . Potassium Chloride ER 20 MEQ TBCR TAKE 2 TABLETS BY MOUTH IN THE MORNING 60 tablet 11  . rosuvastatin (CRESTOR) 40 MG tablet Take 1 tablet (40 mg total) by mouth daily. 30 tablet 11  . traMADol (ULTRAM) 50 MG tablet TAKE 1-2 TABLETS BY MOUTH THREE TIMES DAILY. Do Not Fill this prescription until [facility] for refill. Do Not Fill Before 10/08/2018 140 tablet 5  . vitamin C (ASCORBIC ACID) 500 MG tablet Take 500 mg by mouth daily.     No current facility-administered medications for this visit.     LABS/IMAGING: No results found for this or any previous visit (from the past 48 hour(s)). No results found.  VITALS: BP (!) 151/80   Pulse 63   Temp (!) 97.2 F (36.2 C)   Ht 5\' 4"  (1.626 m)   Wt 144 lb 9.6 oz (65.6 kg)   SpO2 99%   BMI 24.82 kg/m   EXAM: General appearance: alert and no distress Neck: no JVD Lungs: clear to auscultation bilaterally Heart: regular rate and rhythm, S1, S2 normal and systolic murmur: early systolic 2/6, crescendo at 2nd right intercostal space Abdomen: soft, non-tender; bowel sounds normal; no masses,  no organomegaly Extremities: edema trace bilateral Pulses: 2+ and symmetric Skin: Skin color, texture, turgor normal. No rashes or lesions  Neurologic: Grossly normal Psych: Seems somewhat pre-occupied  EKG: Normal sinus rhythm 64-personally reviewed  ASSESSMENT: 1. Chest pain, possible unstable angina 2. Coronary artery disease status post PCI to the RCA with a 3.0 x 33  mm Xience Alpine drug-eluting stent (09/2014) 3. Hypertension-controlled 4. Dyslipidemia -improved  PLAN: 1.   Sara Fox has had a few episodes of chest pain relieved with nitro but this is mostly after more marked exertion.  Overall the frequency is very minimal.  If this increases, we could consider long-acting nitrate or further adjustment of medical therapy.  I am not enthusiastic about a repeat heart catheterization unless she has some significant abnormal findings.  Blood pressure was up a little today.  Her cholesterol is improved.  We will continue current medications.  Follow-up in 6 months or sooner as necessary.  Pixie Casino, MD, [facility], [place] Director of the Advanced Lipid Disorders &  Cardiovascular Risk Reduction Clinic Diplomate of the American Board of Clinical Lipidology Attending Cardiologist  Direct Dial: [phone]  Fax: [phone]  Website:  [email or web address]  03/31/2019, 3:04 PM

## 2019-03-31 NOTE — Patient Instructions (Signed)
Medication Instructions:  Your physician recommends that you continue on your current medications as directed. Please refer to the Current Medication list given to you today.  *If you need a refill on your cardiac medications before your next appointment, please call your pharmacy*   Follow-Up: At [facility], you and your health needs are our priority.  As part of our continuing mission to provide you with exceptional heart care, we have created designated Provider Care Teams.  These Care Teams include your primary Cardiologist (physician) and Advanced Practice Providers (APPs -  Physician Assistants and Nurse Practitioners) who all work together to provide you with the care you need, when you need it.  Your next appointment:   6 months  The format for your next appointment:   In Person  Provider:   You may see Pixie Casino, MD or one of the following Advanced Practice Providers on your designated Care Team:    Almyra Deforest, PA-C  Fabian Sharp, PA-C or   Roby Lofts, [place]   Other Instructions

## 2019-04-07 ENCOUNTER — Other Ambulatory Visit: Payer: Self-pay | Admitting: Internal Medicine

## 2019-04-07 DIAGNOSIS — R32 Unspecified urinary incontinence: Secondary | ICD-10-CM

## 2019-04-24 ENCOUNTER — Encounter: Payer: Self-pay | Admitting: Nurse Practitioner

## 2019-04-24 ENCOUNTER — Ambulatory Visit (INDEPENDENT_AMBULATORY_CARE_PROVIDER_SITE_OTHER): Payer: Medicare Other | Admitting: Nurse Practitioner

## 2019-04-24 ENCOUNTER — Other Ambulatory Visit: Payer: Self-pay

## 2019-04-24 VITALS — BP 110/80 | HR 59 | Temp 97.8°F | Ht 64.4 in | Wt 140.2 lb

## 2019-04-24 DIAGNOSIS — D519 Vitamin B12 deficiency anemia, unspecified: Secondary | ICD-10-CM | POA: Diagnosis not present

## 2019-04-24 MED ORDER — CYANOCOBALAMIN 1000 MCG/ML IJ SOLN
1000.0000 ug | Freq: Once | INTRAMUSCULAR | Status: AC
  Administered 2019-04-24: 1000 ug via INTRAMUSCULAR

## 2019-04-24 NOTE — Progress Notes (Signed)
Subjective:     Patient ID: Sara Fox , female    DOB: 1940/09/12 , 78 y.o.   MRN: [id number]   Chief Complaint  Patient presents with  . B12 Injection    HPI  Here for vitamin B12.  She does not feel as fatigued.    Since her last visit she seen Dr. Debara Pickett, no changes.     Past Medical History:  Diagnosis Date  . Acute kidney injury ([place])   . Back pain   . Cervical cancer ([place])   . CHF (congestive heart failure) ([place])   . COPD (chronic obstructive pulmonary disease) ([place])   . Coronary artery disease    s/p DES x2 to RCA 2016  . GERD (gastroesophageal reflux disease)   . Hyperlipidemia   . Hypertension   . Myocardial infarction ([place])   . Neck pain   . Rhabdomyolysis   . Stomach problems      Family History  Problem Relation Age of Onset  . Heart disease Mother        also HTN  . Diabetes Mother   . Colon cancer Mother   . Heart disease Brother        deceased at 35  . Hypertension Brother   . Heart attack Brother   . Heart disease Brother   . Hypertension Brother      Current Outpatient Medications:  .  aspirin EC 81 MG tablet, Take 81 mg by mouth daily., Disp: , Rfl:  .  budesonide-formoterol (SYMBICORT) 160-4.5 MCG/ACT inhaler, Inhale 2 puffs into the lungs 2 (two) times daily., Disp: , Rfl:  .  CALCIUM CITRATE PO, Take 1 tablet by mouth daily. , Disp: , Rfl:  .  Cholecalciferol (VITAMIN D3) 2000 UNITS capsule, Take 2,000 Units by mouth daily., Disp: , Rfl:  .  ciclopirox (PENLAC) 8 % solution, Apply topically at bedtime. Apply over nail and surrounding skin. Apply daily over previous coat. After seven (7) days, file nail and continue cycle., Disp: 6.6 mL, Rfl: 0 .  furosemide (LASIX) 20 MG tablet, TAKE 1 TABLET BY MOUTH EVERY OTHER DAY, Disp: 45 tablet, Rfl: 0 .  gabapentin (NEURONTIN) 100 MG capsule, Take 100 mg by mouth daily. , Disp: , Rfl:  .  metoprolol tartrate (LOPRESSOR) 25 MG tablet, TAKE 1 TABLET BY MOUTH TWICE DAILY WITH A MEAL, Disp: 180  tablet, Rfl: 1 .  Multiple Vitamin (MULTIVITAMIN) capsule, Take 1 capsule by mouth daily. Contains iron, Disp: , Rfl:  .  nitroGLYCERIN (NITROSTAT) 0.4 MG SL tablet, Place 1 tablet (0.4 mg total) under the tongue every 5 (five) minutes as needed for chest pain., Disp: 25 tablet, Rfl: 3 .  oxybutynin (DITROPAN) 5 MG tablet, Take 1/2 (one-half) tablet by mouth twice daily, Disp: 30 tablet, Rfl: 0 .  pantoprazole (PROTONIX) 40 MG tablet, Take 1 tablet by mouth twice daily, Disp: 180 tablet, Rfl: 1 .  polyethylene glycol (MIRALAX / GLYCOLAX) packet, Take 17 g by mouth daily as needed for mild constipation. , Disp: , Rfl:  .  Potassium Chloride ER 20 MEQ TBCR, TAKE 2 TABLETS BY MOUTH IN THE MORNING, Disp: 60 tablet, Rfl: 11 .  rosuvastatin (CRESTOR) 40 MG tablet, Take 1 tablet (40 mg total) by mouth daily., Disp: 30 tablet, Rfl: 11 .  traMADol (ULTRAM) 50 MG tablet, TAKE 1-2 TABLETS BY MOUTH THREE TIMES DAILY. Do Not Fill this prescription until [facility] for refill. Do Not Fill Before 10/08/2018, Disp: 140 tablet, Rfl:  5 .  vitamin C (ASCORBIC ACID) 500 MG tablet, Take 500 mg by mouth daily., Disp: , Rfl:    Allergies  Allergen Reactions  . Buprenorphine Hcl Shortness Of Breath    Throat swelling/trouble breathing and lethargic  . Morphine And Related Shortness Of Breath    Throat swelling/trouble breathing and lethargic  . Celebrex [Celecoxib] Diarrhea and Swelling    Swelling of legs   . Vioxx [Rofecoxib] Palpitations  . Codeine Other (See Comments)    Bloated      Review of Systems  Constitutional: Negative.  Negative for fatigue.  Respiratory: Negative.   Cardiovascular: Negative.   Gastrointestinal: Negative.   Skin: Negative.   Neurological: Negative.   Psychiatric/Behavioral: Negative.      Today's Vitals   04/24/19 0842  BP: 110/80  Pulse: (!) 59  Temp: 97.8 F (36.6 C)  TempSrc: Oral  Weight: 140 lb 3.2 oz (63.6 kg)  Height: 5' 4.4" (1.636 m)  PainSc: 0-No pain    Body mass index is 23.77 kg/m.   Objective:  Physical Exam Vitals and nursing note reviewed.  Constitutional:      General: She is not in acute distress.    Appearance: Normal appearance.  HENT:     Head: Normocephalic and atraumatic.  Cardiovascular:     Rate and Rhythm: Normal rate and regular rhythm.     Heart sounds: Normal heart sounds.  Pulmonary:     Effort: Pulmonary effort is normal.     Breath sounds: Normal breath sounds.     Comments:   Skin:    General: Skin is warm.     Capillary Refill: Capillary refill takes less than 2 seconds.  Neurological:     General: No focal deficit present.     Mental Status: She is alert and oriented to person, place, and time.     Cranial Nerves: No cranial nerve deficit.  Psychiatric:        Mood and Affect: Mood normal.        Behavior: Behavior normal.        Thought Content: Thought content normal.        Judgment: Judgment normal.         Assessment And Plan:    .  1. Anemia due to vitamin B12 deficiency, unspecified B12 deficiency type  Continues to not feel as fatigued.    She is doing well when she takes her vitamin B12 injection has been beneficial with her fatigue. - cyanocobalamin ((VITAMIN B-12)) injection 1,000 mcg   She has seen Dr. Debara Pickett since her last visit, no changes and at this time not planning a heart cath for her rare intermittent chest pain.  He may consider nitrates long term if chest pain persists.   [name], FNP    THE PATIENT IS ENCOURAGED TO PRACTICE SOCIAL DISTANCING DUE TO THE COVID-19 PANDEMIC.

## 2019-04-30 ENCOUNTER — Other Ambulatory Visit: Payer: Self-pay | Admitting: Internal Medicine

## 2019-04-30 ENCOUNTER — Other Ambulatory Visit: Payer: Self-pay | Admitting: Physical Medicine & Rehabilitation

## 2019-05-27 ENCOUNTER — Ambulatory Visit (INDEPENDENT_AMBULATORY_CARE_PROVIDER_SITE_OTHER): Payer: Medicare Other | Admitting: Sports Medicine

## 2019-05-27 ENCOUNTER — Other Ambulatory Visit: Payer: Self-pay

## 2019-05-27 ENCOUNTER — Encounter: Payer: Self-pay | Admitting: Sports Medicine

## 2019-05-27 DIAGNOSIS — B351 Tinea unguium: Secondary | ICD-10-CM | POA: Diagnosis not present

## 2019-05-27 DIAGNOSIS — M79674 Pain in right toe(s): Secondary | ICD-10-CM

## 2019-05-27 DIAGNOSIS — M79675 Pain in left toe(s): Secondary | ICD-10-CM

## 2019-05-27 DIAGNOSIS — L84 Corns and callosities: Secondary | ICD-10-CM

## 2019-05-27 NOTE — Progress Notes (Signed)
Subjective: Sara Fox is a 79 y.o. female patient seen today in office with complaint of mildly painful corn and thickened and elongated toenails; unable to trim. Patient denies changes with medical history since last encounter. Patient has no other pedal complaints at this time.   Patient Active Problem List   Diagnosis Date Noted  . Fatigue 02/18/2019  . Memory loss 02/18/2019  . Vitamin B12 deficiency 10/23/2018  . Hypotension 10/23/2018  . Stage 3 chronic kidney disease 06/03/2018  . Chronic anemia 06/03/2018  . Hypercholesterolemia 06/03/2018  . Malnutrition of moderate degree 07/12/2017  . Hypoalbuminemia 07/11/2017  . Elevated LFTs 07/11/2017  . Closed fracture of left distal radius 05/28/2017  . Snoring 12/21/2016  . OSA and COPD overlap syndrome ([place]) 12/21/2016  . Peripheral musculoskeletal gait disorder 02/05/2015  . Lumbar post-laminectomy syndrome 02/05/2015  . CAD in native artery 10/16/2014  . NSTEMI (non-ST elevated myocardial infarction) ([place]) 09/13/2014  . Cardiomyopathy, ischemic 09/13/2014  . Chronic systolic heart failure ([place])   . Tobacco abuse 09/12/2014  . Dyslipidemia 09/12/2014  . CKD (chronic kidney disease), stage II 09/12/2014  . Normocytic anemia 09/12/2014  . Chronic diastolic heart failure, NYHA class 1 ([place]) 09/12/2014  . Postlaminectomy syndrome, cervical region 08/01/2013  . Chronic midline low back pain with left-sided sciatica 08/01/2013  . Shoulder joint contracture 08/01/2013  . HTN (hypertension) 03/03/2013  . Abnormal genetic test 03/03/2013    Current Outpatient Medications on File Prior to Visit  Medication Sig Dispense Refill  . aspirin EC 81 MG tablet Take 81 mg by mouth daily.    . budesonide-formoterol (SYMBICORT) 160-4.5 MCG/ACT inhaler Inhale 2 puffs into the lungs 2 (two) times daily.    Marland Kitchen CALCIUM CITRATE PO Take 1 tablet by mouth daily.     . Cholecalciferol (VITAMIN D3) 2000 UNITS capsule Take 2,000 Units by mouth  daily.    . ciclopirox (PENLAC) 8 % solution Apply topically at bedtime. Apply over nail and surrounding skin. Apply daily over previous coat. After seven (7) days, file nail and continue cycle. 6.6 mL 0  . furosemide (LASIX) 20 MG tablet TAKE 1 TABLET BY MOUTH EVERY OTHER DAY 45 tablet 1  . gabapentin (NEURONTIN) 100 MG capsule TAKE 1 CAPSULE BY MOUTH THREE TIMES DAILY 270 capsule 0  . metoprolol tartrate (LOPRESSOR) 25 MG tablet TAKE 1 TABLET BY MOUTH TWICE DAILY WITH A MEAL 180 tablet 1  . Multiple Vitamin (MULTIVITAMIN) capsule Take 1 capsule by mouth daily. Contains iron    . nitroGLYCERIN (NITROSTAT) 0.4 MG SL tablet PLACE ONE TABLET UNDER THE TONGUE EVERY 5 MINUTES AS NEEDED FOR CHEST PAIN. 25 tablet 0  . oxybutynin (DITROPAN) 5 MG tablet Take 1/2 (one-half) tablet by mouth twice daily 30 tablet 0  . pantoprazole (PROTONIX) 40 MG tablet Take 1 tablet by mouth twice daily 180 tablet 1  . polyethylene glycol (MIRALAX / GLYCOLAX) packet Take 17 g by mouth daily as needed for mild constipation.     . Potassium Chloride ER 20 MEQ TBCR TAKE 2 TABLETS BY MOUTH IN THE MORNING 60 tablet 11  . rosuvastatin (CRESTOR) 40 MG tablet Take 1 tablet (40 mg total) by mouth daily. 30 tablet 11  . traMADol (ULTRAM) 50 MG tablet TAKE 1-2 TABLETS BY MOUTH THREE TIMES DAILY. Do Not Fill this prescription until [facility] for refill. Do Not Fill Before 10/08/2018 140 tablet 5  . vitamin C (ASCORBIC ACID) 500 MG tablet Take 500 mg by mouth daily.  No current facility-administered medications on file prior to visit.    Allergies  Allergen Reactions  . Buprenorphine Hcl Shortness Of Breath    Throat swelling/trouble breathing and lethargic  . Morphine And Related Shortness Of Breath    Throat swelling/trouble breathing and lethargic  . Celebrex [Celecoxib] Diarrhea and Swelling    Swelling of legs   . Vioxx [Rofecoxib] Palpitations  . Codeine Other (See Comments)    Bloated      Objective: Physical Exam  General: Well developed, nourished, no acute distress, awake, alert and oriented x 3  Vascular: Dorsalis pedis artery 2/4 bilateral, Posterior tibial artery 2/4 bilateral, skin temperature warm to warm proximal to distal bilateral lower extremities, no varicosities, pedal hair present bilateral.  Neurological: Gross sensation present via light touch bilateral.   Dermatological: Skin is warm, dry, and supple bilateral, Nails 1-10 are tender, long, thick, and discolored with mild subungal debris, no webspace macerations present bilateral, no open lesions present bilateral, + corn/hyperkeratotic tissue present left medial heel. No signs of infection bilateral.  Musculoskeletal: Asymptomatic hammertoe boney deformities noted bilateral. Muscular strength within normal limits without painon range of motion. No pain with calf compression bilateral.  Assessment and Plan:  Problem List Items Addressed This Visit    None    Visit Diagnoses    Pain due to onychomycosis of toenails of both feet    -  Primary   Corns and callosities          -Examined patient.  -Re-Discussed treatment options for painful mycotic nails/corn. -Mechanically debrided and reduced mycotic nails with sterile nail nipper and dremel nail file without incident. -At no charge trimmed corn at medial left heel using sterile chisel blade without incident  -Continue with Pen[place]  -Patient to return in 3 months for follow up evaluation or sooner if symptoms worsen.  Landis Martins, DPM

## 2019-05-28 ENCOUNTER — Other Ambulatory Visit: Payer: Self-pay | Admitting: Nurse Practitioner

## 2019-05-28 DIAGNOSIS — R32 Unspecified urinary incontinence: Secondary | ICD-10-CM

## 2019-06-11 ENCOUNTER — Encounter: Payer: Self-pay | Admitting: Nurse Practitioner

## 2019-06-11 ENCOUNTER — Other Ambulatory Visit: Payer: Self-pay

## 2019-06-11 ENCOUNTER — Ambulatory Visit (INDEPENDENT_AMBULATORY_CARE_PROVIDER_SITE_OTHER): Payer: Medicare Other | Admitting: Nurse Practitioner

## 2019-06-11 VITALS — BP 102/60 | HR 68 | Temp 98.3°F | Ht 63.4 in | Wt 135.0 lb

## 2019-06-11 DIAGNOSIS — N1831 Chronic kidney disease, stage 3a: Secondary | ICD-10-CM | POA: Diagnosis not present

## 2019-06-11 DIAGNOSIS — I130 Hypertensive heart and chronic kidney disease with heart failure and stage 1 through stage 4 chronic kidney disease, or unspecified chronic kidney disease: Secondary | ICD-10-CM

## 2019-06-11 DIAGNOSIS — I509 Heart failure, unspecified: Secondary | ICD-10-CM | POA: Diagnosis not present

## 2019-06-11 DIAGNOSIS — Z8639 Personal history of other endocrine, nutritional and metabolic disease: Secondary | ICD-10-CM

## 2019-06-11 DIAGNOSIS — R5383 Other fatigue: Secondary | ICD-10-CM | POA: Diagnosis not present

## 2019-06-11 DIAGNOSIS — D519 Vitamin B12 deficiency anemia, unspecified: Secondary | ICD-10-CM | POA: Diagnosis not present

## 2019-06-11 DIAGNOSIS — F17200 Nicotine dependence, unspecified, uncomplicated: Secondary | ICD-10-CM

## 2019-06-11 DIAGNOSIS — E440 Moderate protein-calorie malnutrition: Secondary | ICD-10-CM

## 2019-06-11 MED ORDER — CYANOCOBALAMIN 1000 MCG/ML IJ SOLN
1000.0000 ug | Freq: Once | INTRAMUSCULAR | Status: AC
  Administered 2019-06-11: 12:00:00 1000 ug via INTRAMUSCULAR

## 2019-06-11 NOTE — Progress Notes (Signed)
This visit occurred during the SARS-CoV-2 public health emergency.  Safety protocols were in place, including screening questions prior to the visit, additional usage of staff PPE, and extensive cleaning of exam room while observing appropriate contact time as indicated for disinfecting solutions.  Subjective:     Patient ID: Sara Fox , female    DOB: [birth year] , 79 y.o.   MRN: [id number]   Chief Complaint  Patient presents with  . Hypertension   HPI  Hypertension This is a chronic problem. The current episode started more than 1 year ago. The problem has been gradually improving since onset. The problem is controlled. Pertinent negatives include no blurred vision, chest pain, headaches, palpitations or shortness of breath. Risk factors for coronary artery disease include sedentary lifestyle. Past treatments include beta blockers. The current treatment provides significant improvement. There is no history of chronic renal disease.     Past Medical History:  Diagnosis Date  . Acute kidney injury ([place])   . Back pain   . Cervical cancer ([place])   . CHF (congestive heart failure) ([place])   . COPD (chronic obstructive pulmonary disease) ([place])   . Coronary artery disease    s/p DES x2 to RCA 2016  . GERD (gastroesophageal reflux disease)   . Hyperlipidemia   . Hypertension   . Myocardial infarction ([place])   . Neck pain   . Rhabdomyolysis   . Stomach problems      Family History  Problem Relation Age of Onset  . Heart disease Mother        also HTN  . Diabetes Mother   . Colon cancer Mother   . Heart disease Brother        deceased at 58  . Hypertension Brother   . Heart attack Brother   . Heart disease Brother   . Hypertension Brother      Current Outpatient Medications:  .  aspirin EC 81 MG tablet, Take 81 mg by mouth daily., Disp: , Rfl:  .  budesonide-formoterol (SYMBICORT) 160-4.5 MCG/ACT inhaler, Inhale 2 puffs into the lungs 2 (two) times daily., Disp: , Rfl:  .   CALCIUM CITRATE PO, Take 1 tablet by mouth daily. , Disp: , Rfl:  .  Cholecalciferol (VITAMIN D3) 2000 UNITS capsule, Take 2,000 Units by mouth daily., Disp: , Rfl:  .  furosemide (LASIX) 20 MG tablet, TAKE 1 TABLET BY MOUTH EVERY OTHER DAY, Disp: 45 tablet, Rfl: 1 .  gabapentin (NEURONTIN) 100 MG capsule, TAKE 1 CAPSULE BY MOUTH THREE TIMES DAILY, Disp: 270 capsule, Rfl: 0 .  metoprolol tartrate (LOPRESSOR) 25 MG tablet, TAKE 1 TABLET BY MOUTH TWICE DAILY WITH A MEAL, Disp: 180 tablet, Rfl: 1 .  Multiple Vitamin (MULTIVITAMIN) capsule, Take 1 capsule by mouth daily. Contains iron, Disp: , Rfl:  .  nitroGLYCERIN (NITROSTAT) 0.4 MG SL tablet, PLACE ONE TABLET UNDER THE TONGUE EVERY 5 MINUTES AS NEEDED FOR CHEST PAIN., Disp: 25 tablet, Rfl: 0 .  oxybutynin (DITROPAN) 5 MG tablet, Take 1/2 (one-half) tablet by mouth twice daily, Disp: 30 tablet, Rfl: 0 .  pantoprazole (PROTONIX) 40 MG tablet, Take 1 tablet by mouth twice daily, Disp: 180 tablet, Rfl: 1 .  polyethylene glycol (MIRALAX / GLYCOLAX) packet, Take 17 g by mouth daily as needed for mild constipation. , Disp: , Rfl:  .  Potassium Chloride ER 20 MEQ TBCR, TAKE 2 TABLETS BY MOUTH IN THE MORNING, Disp: 60 tablet, Rfl: 11 .  rosuvastatin (CRESTOR) 40 MG tablet,  Take 1 tablet (40 mg total) by mouth daily., Disp: 30 tablet, Rfl: 11 .  traMADol (ULTRAM) 50 MG tablet, TAKE 1-2 TABLETS BY MOUTH THREE TIMES DAILY. Do Not Fill this prescription until [facility] for refill. Do Not Fill Before 10/08/2018, Disp: 140 tablet, Rfl: 5 .  vitamin C (ASCORBIC ACID) 500 MG tablet, Take 500 mg by mouth daily., Disp: , Rfl:  .  ciclopirox (PENLAC) 8 % solution, Apply topically at bedtime. Apply over nail and surrounding skin. Apply daily over previous coat. After seven (7) days, file nail and continue cycle. (Patient not taking: Reported on 06/11/2019), Disp: 6.6 mL, Rfl: 0   Allergies  Allergen Reactions  . Buprenorphine Hcl Shortness Of Breath    Throat  swelling/trouble breathing and lethargic  . Morphine And Related Shortness Of Breath    Throat swelling/trouble breathing and lethargic  . Celebrex [Celecoxib] Diarrhea and Swelling    Swelling of legs   . Vioxx [Rofecoxib] Palpitations  . Codeine Other (See Comments)    Bloated      Review of Systems  Constitutional: Negative.  Negative for fatigue.  Eyes: Negative for blurred vision and visual disturbance.  Respiratory: Negative.  Negative for shortness of breath.   Cardiovascular: Negative.  Negative for chest pain, palpitations and leg swelling.  Gastrointestinal: Negative.   Endocrine: Negative.   Musculoskeletal: Negative.   Skin: Negative.   Neurological: Negative for dizziness, weakness and headaches.  Psychiatric/Behavioral: Negative for confusion. The patient is not nervous/anxious.      Today's Vitals   06/11/19 1007  BP: 102/60  Pulse: 68  Temp: 98.3 F (36.8 C)  TempSrc: Oral  Weight: 135 lb (61.2 kg)  Height: 5' 3.4" (1.61 m)  PainSc: 0-No pain   Body mass index is 23.61 kg/m.   Objective:  Physical Exam Vitals reviewed.  Constitutional:      Appearance: Normal appearance. She is well-developed. She is obese.  HENT:     Head: Normocephalic and atraumatic.  Eyes:     Pupils: Pupils are equal, round, and reactive to light.  Cardiovascular:     Rate and Rhythm: Normal rate and regular rhythm.     Pulses: Normal pulses.     Heart sounds: Normal heart sounds. No murmur.  Pulmonary:     Effort: Pulmonary effort is normal.     Breath sounds: Normal breath sounds.  Musculoskeletal:        General: Normal range of motion.  Skin:    General: Skin is warm and dry.     Capillary Refill: Capillary refill takes less than 2 seconds.  Neurological:     General: No focal deficit present.     Mental Status: She is alert and oriented to person, place, and time.     Cranial Nerves: No cranial nerve deficit.  Psychiatric:        Mood and Affect: Mood normal.          Assessment And Plan:     1. Moderate protein-calorie malnutrition ([place])  This is resolved  2. 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 Stage 3a chronic kidney disease Avoid NSAID and keep blood pressure within normal range  4. Tobacco use disorder Ready to quit: No Counseling given: Yes Comment: currently smokes 1/2ppd or less, she is using nicotine gum  Smoking cessation instruction/counseling given:  counseled patient on the dangers of tobacco use, advised patient to stop smoking, and reviewed strategies to maximize success  5. Anemia due to vitamin B12 deficiency, unspecified B12 deficiency type  Chronic, will check vitamin B12 level  Encouraged to take 1000 mcg of vitamin B12 daily - cyanocobalamin ((VITAMIN B-12)) injection 1,000 mcg   [name], FNP    THE PATIENT IS ENCOURAGED TO PRACTICE SOCIAL DISTANCING DUE TO THE COVID-19 PANDEMIC.

## 2019-06-12 LAB — CMP14+EGFR
ALT: 10 IU/L (ref 0–32)
AST: 15 IU/L (ref 0–40)
Albumin/Globulin Ratio: 1.4 (ref 1.2–2.2)
Albumin: 4.1 g/dL (ref 3.7–4.7)
Alkaline Phosphatase: 114 IU/L (ref 39–117)
BUN/Creatinine Ratio: 12 (ref 12–28)
BUN: 19 mg/dL (ref 8–27)
Bilirubin Total: 0.4 mg/dL (ref 0.0–1.2)
CO2: 19 mmol/L — ABNORMAL LOW (ref 20–29)
Calcium: 9.6 mg/dL (ref 8.7–10.3)
Chloride: 105 mmol/L (ref 96–106)
Creatinine, Ser: 1.62 mg/dL — ABNORMAL HIGH (ref 0.57–1.00)
GFR calc Af Amer: 35 mL/min/{1.73_m2} — ABNORMAL LOW (ref >59)
GFR calc non Af Amer: 30 mL/min/{1.73_m2} — ABNORMAL LOW (ref >59)
Globulin, Total: 2.9 g/dL (ref 1.5–4.5)
Glucose: 94 mg/dL (ref 65–99)
Potassium: 4.5 mmol/L (ref 3.5–5.2)
Sodium: 137 mmol/L (ref 134–144)
Total Protein: 7 g/dL (ref 6.0–8.5)

## 2019-06-12 LAB — CBC
Hematocrit: 35.4 % (ref 34.0–46.6)
Hemoglobin: 11.6 g/dL (ref 11.1–15.9)
MCH: 30.4 pg (ref 26.6–33.0)
MCHC: 32.8 g/dL (ref 31.5–35.7)
MCV: 93 fL (ref 79–97)
Platelets: 315 10*3/uL (ref 150–450)
RBC: 3.82 x10E6/uL (ref 3.77–5.28)
RDW: 12.1 % (ref 11.7–15.4)
WBC: 5.1 10*3/uL (ref 3.4–10.8)

## 2019-06-12 LAB — VITAMIN B12: Vitamin B-12: 945 pg/mL (ref 232–1245)

## 2019-06-13 ENCOUNTER — Encounter: Payer: Self-pay | Admitting: Nurse Practitioner

## 2019-06-27 ENCOUNTER — Other Ambulatory Visit: Payer: Self-pay | Admitting: Nurse Practitioner

## 2019-06-27 ENCOUNTER — Other Ambulatory Visit: Payer: Self-pay | Admitting: Physical Medicine & Rehabilitation

## 2019-06-27 DIAGNOSIS — R32 Unspecified urinary incontinence: Secondary | ICD-10-CM

## 2019-06-30 ENCOUNTER — Other Ambulatory Visit: Payer: Self-pay

## 2019-06-30 ENCOUNTER — Encounter: Payer: Self-pay | Admitting: Nurse Practitioner

## 2019-06-30 ENCOUNTER — Ambulatory Visit (INDEPENDENT_AMBULATORY_CARE_PROVIDER_SITE_OTHER): Payer: Medicare Other | Admitting: Nurse Practitioner

## 2019-06-30 VITALS — BP 118/70 | HR 103 | Temp 98.2°F | Ht 63.4 in | Wt 130.0 lb

## 2019-06-30 DIAGNOSIS — R8290 Unspecified abnormal findings in urine: Secondary | ICD-10-CM | POA: Diagnosis not present

## 2019-06-30 DIAGNOSIS — N390 Urinary tract infection, site not specified: Secondary | ICD-10-CM | POA: Diagnosis not present

## 2019-06-30 LAB — POCT URINALYSIS DIPSTICK
Glucose, UA: NEGATIVE
Ketones, UA: NEGATIVE
Leukocytes, UA: NEGATIVE
Nitrite, UA: POSITIVE
Protein, UA: POSITIVE — AB
Spec Grav, UA: 1.025 (ref 1.010–1.025)
Urobilinogen, UA: 1 E.U./dL
pH, UA: 6 (ref 5.0–8.0)

## 2019-06-30 MED ORDER — NITROFURANTOIN MONOHYD MACRO 100 MG PO CAPS: 100.0000 mg | ORAL_CAPSULE | Freq: Two times a day (BID) | ORAL | 0 refills | Status: AC

## 2019-06-30 MED ORDER — CEFTRIAXONE SODIUM 1 G IJ SOLR
1.0000 g | Freq: Once | INTRAMUSCULAR | Status: AC
  Administered 2019-06-30: 1 g via INTRAMUSCULAR

## 2019-06-30 NOTE — Progress Notes (Addendum)
This visit occurred during the SARS-CoV-2 public health emergency.  Safety protocols were in place, including screening questions prior to the visit, additional usage of staff PPE, and extensive cleaning of exam room while observing appropriate contact time as indicated for disinfecting solutions.  Subjective:     Patient ID: Sara Fox , female    DOB: [birth year] , 79 y.o.   MRN: [id number]   Chief Complaint  Patient presents with  . DISORIENTED    Patient's son stated she has been having a bad episode of dementia. she has been extremely tired, not eating and has not taken any meds.     HPI  Son reports she was hiding things from herself this weekend, the electricity went out and would not put on clothes, she was more agitated. Sleeping more, decreased appetite.Son also reports she had been more "mean as a snake" to him.  Urinary frequency with small amounts.  Urine strong odor.  Son denies fever.      Past Medical History:  Diagnosis Date  . Acute kidney injury ([place])   . Back pain   . Cervical cancer ([place])   . CHF (congestive heart failure) ([place])   . COPD (chronic obstructive pulmonary disease) ([place])   . Coronary artery disease    s/p DES x2 to RCA 2016  . GERD (gastroesophageal reflux disease)   . Hyperlipidemia   . Hypertension   . Myocardial infarction ([place])   . Neck pain   . Rhabdomyolysis   . Stomach problems      Family History  Problem Relation Age of Onset  . Heart disease Mother        also HTN  . Diabetes Mother   . Colon cancer Mother   . Heart disease Brother        deceased at 55  . Hypertension Brother   . Heart attack Brother   . Heart disease Brother   . Hypertension Brother      Current Outpatient Medications:  .  aspirin EC 81 MG tablet, Take 81 mg by mouth daily., Disp: , Rfl:  .  budesonide-formoterol (SYMBICORT) 160-4.5 MCG/ACT inhaler, Inhale 2 puffs into the lungs 2 (two) times daily., Disp: , Rfl:  .  CALCIUM CITRATE PO, Take 1  tablet by mouth daily. , Disp: , Rfl:  .  Cholecalciferol (VITAMIN D3) 2000 UNITS capsule, Take 2,000 Units by mouth daily., Disp: , Rfl:  .  ciclopirox (PENLAC) 8 % solution, Apply topically at bedtime. Apply over nail and surrounding skin. Apply daily over previous coat. After seven (7) days, file nail and continue cycle., Disp: 6.6 mL, Rfl: 0 .  furosemide (LASIX) 20 MG tablet, TAKE 1 TABLET BY MOUTH EVERY OTHER DAY, Disp: 45 tablet, Rfl: 1 .  gabapentin (NEURONTIN) 100 MG capsule, TAKE 1 CAPSULE BY MOUTH THREE TIMES DAILY, Disp: 270 capsule, Rfl: 0 .  metoprolol tartrate (LOPRESSOR) 25 MG tablet, TAKE 1 TABLET BY MOUTH TWICE DAILY WITH A MEAL, Disp: 180 tablet, Rfl: 1 .  Multiple Vitamin (MULTIVITAMIN) capsule, Take 1 capsule by mouth daily. Contains iron, Disp: , Rfl:  .  nitroGLYCERIN (NITROSTAT) 0.4 MG SL tablet, PLACE ONE TABLET UNDER THE TONGUE EVERY 5 MINUTES AS NEEDED FOR CHEST PAIN., Disp: 25 tablet, Rfl: 0 .  oxybutynin (DITROPAN) 5 MG tablet, Take 1/2 (one-half) tablet by mouth twice daily, Disp: 30 tablet, Rfl: 0 .  pantoprazole (PROTONIX) 40 MG tablet, Take 1 tablet by mouth twice daily, Disp: 180 tablet, Rfl: 1 .  polyethylene glycol (MIRALAX / GLYCOLAX) packet, Take 17 g by mouth daily as needed for mild constipation. , Disp: , Rfl:  .  Potassium Chloride ER 20 MEQ TBCR, TAKE 2 TABLETS BY MOUTH IN THE MORNING, Disp: 60 tablet, Rfl: 11 .  rosuvastatin (CRESTOR) 40 MG tablet, Take 1 tablet (40 mg total) by mouth daily., Disp: 30 tablet, Rfl: 11 .  traMADol (ULTRAM) 50 MG tablet, TAKE 1-2 TABLETS THREE TIMES DAILY. DO NOT FILL THIS PRESCRIPTION UNTIL [facility] CALLS FOR REFILL., Disp: 140 tablet, Rfl: 1 .  vitamin C (ASCORBIC ACID) 500 MG tablet, Take 500 mg by mouth daily., Disp: , Rfl:    Allergies  Allergen Reactions  . Buprenorphine Hcl Shortness Of Breath    Throat swelling/trouble breathing and lethargic  . Morphine And Related Shortness Of Breath    Throat swelling/trouble  breathing and lethargic  . Celebrex [Celecoxib] Diarrhea and Swelling    Swelling of legs   . Vioxx [Rofecoxib] Palpitations  . Codeine Other (See Comments)    Bloated      Review of Systems  Constitutional: Positive for fatigue.  Respiratory: Negative.   Cardiovascular: Negative for chest pain, palpitations and leg swelling.  Endocrine: Negative for polydipsia, polyphagia and polyuria.  Genitourinary: Positive for frequency and urgency. Negative for dysuria and hematuria.  Neurological: Negative for dizziness and headaches.  Psychiatric/Behavioral:       Confusion this weekend     Today's Vitals   06/30/19 1159  BP: 118/70  Pulse: (!) 103  Temp: 98.2 F (36.8 C)  TempSrc: Oral  Weight: 130 lb (59 kg)  Height: 5' 3.4" (1.61 m)  PainSc: 0-No pain   Body mass index is 22.74 kg/m.   Objective:  Physical Exam Constitutional:      General: She is not in acute distress.    Appearance: Normal appearance.  HENT:     Head: Normocephalic and atraumatic.  Cardiovascular:     Rate and Rhythm: Normal rate and regular rhythm.     Pulses: Normal pulses.     Heart sounds: Normal heart sounds. No murmur.  Pulmonary:     Effort: Pulmonary effort is normal. No respiratory distress.     Breath sounds: Normal breath sounds.  Skin:    General: Skin is warm.     Capillary Refill: Capillary refill takes less than 2 seconds.  Neurological:     General: No focal deficit present.     Mental Status: She is alert and oriented to person, place, and time.     Cranial Nerves: No cranial nerve deficit.  Psychiatric:        Mood and Affect: Mood normal.        Behavior: Behavior normal.        Thought Content: Thought content normal.        Judgment: Judgment normal.     Comments: She has a hole in her jogging pants that appears to be a burn.           Assessment And Plan:     1. Abnormal urine odor  Encouraged to stay well hydrated - POCT Urinalysis Dipstick (81002)  2. Urinary  tract infection without hematuria, site unspecified  Positive nitrites  Will treat with nitrofuratoin  Discussed with son changes in memory can be related to infection will treat with medications and recheck memory score after   Rocephin 1 gram given in office    [name], FNP    THE PATIENT IS ENCOURAGED  TO PRACTICE SOCIAL DISTANCING DUE TO THE COVID-19 PANDEMIC.

## 2019-06-30 NOTE — Patient Instructions (Signed)
Urinary Tract Infection, Adult A urinary tract infection (UTI) is an infection of any part of the urinary tract. The urinary tract includes:  The kidneys.  The ureters.  The bladder.  The urethra. These organs make, store, and get rid of pee (urine) in the body. What are the causes? This is caused by germs (bacteria) in your genital area. These germs grow and cause swelling (inflammation) of your urinary tract. What increases the risk? You are more likely to develop this condition if:  You have a small, thin tube (catheter) to drain pee.  You cannot control when you pee or poop (incontinence).  You are female, and: ? You use these methods to prevent pregnancy:  A medicine that kills sperm (spermicide).  A device that blocks sperm (diaphragm). ? You have low levels of a female hormone (estrogen). ? You are pregnant.  You have genes that add to your risk.  You are sexually active.  You take antibiotic medicines.  You have trouble peeing because of: ? A prostate that is bigger than normal, if you are female. ? A blockage in the part of your body that drains pee from the bladder (urethra). ? A kidney stone. ? A nerve condition that affects your bladder (neurogenic bladder). ? Not getting enough to drink. ? Not peeing often enough.  You have other conditions, such as: ? Diabetes. ? A weak disease-fighting system (immune system). ? Sickle cell disease. ? Gout. ? Injury of the spine. What are the signs or symptoms? Symptoms of this condition include:  Needing to pee right away (urgently).  Peeing often.  Peeing small amounts often.  Pain or burning when peeing.  Blood in the pee.  Pee that smells bad or not like normal.  Trouble peeing.  Pee that is cloudy.  Fluid coming from the vagina, if you are female.  Pain in the belly or lower back. Other symptoms include:  Throwing up (vomiting).  No urge to eat.  Feeling mixed up (confused).  Being tired  and grouchy (irritable).  A fever.  Watery poop (diarrhea). How is this treated? This condition may be treated with:  Antibiotic medicine.  Other medicines.  Drinking enough water. Follow these instructions at home:  Medicines  Take over-the-counter and prescription medicines only as told by your doctor.  If you were prescribed an antibiotic medicine, take it as told by your doctor. Do not stop taking it even if you start to feel better. General instructions  Make sure you: ? Pee until your bladder is empty. ? Do not hold pee for a long time. ? Empty your bladder after sex. ? Wipe from front to back after pooping if you are a female. Use each tissue one time when you wipe.  Drink enough fluid to keep your pee pale yellow.  Keep all follow-up visits as told by your doctor. This is important. Contact a doctor if:  You do not get better after 1-2 days.  Your symptoms go away and then come back. Get help right away if:  You have very bad back pain.  You have very bad pain in your lower belly.  You have a fever.  You are sick to your stomach (nauseous).  You are throwing up. Summary  A urinary tract infection (UTI) is an infection of any part of the urinary tract.  This condition is caused by germs in your genital area.  There are many risk factors for a UTI. These include having a small, thin   tube to drain pee and not being able to control when you pee or poop.  Treatment includes antibiotic medicines for germs.  Drink enough fluid to keep your pee pale yellow. This information is not intended to replace advice given to you by your health care provider. Make sure you discuss any questions you have with your health care provider. Document Revised: 04/18/2018 Document Reviewed: 11/08/2017 Elsevier Patient Education  2020 Elsevier Inc.  

## 2019-07-02 ENCOUNTER — Telehealth: Payer: Self-pay | Admitting: Registered Nurse

## 2019-07-02 NOTE — Telephone Encounter (Signed)
PMP was Reviewed: Last Tramadol was filled on 05/27/2019.

## 2019-07-03 ENCOUNTER — Other Ambulatory Visit: Payer: Self-pay

## 2019-07-03 ENCOUNTER — Encounter: Payer: Self-pay | Admitting: Registered Nurse

## 2019-07-03 ENCOUNTER — Encounter: Payer: Medicare Other | Attending: Physical Medicine & Rehabilitation | Admitting: Registered Nurse

## 2019-07-03 DIAGNOSIS — M533 Sacrococcygeal disorders, not elsewhere classified: Secondary | ICD-10-CM | POA: Insufficient documentation

## 2019-07-03 DIAGNOSIS — Z79891 Long term (current) use of opiate analgesic: Secondary | ICD-10-CM | POA: Insufficient documentation

## 2019-07-03 DIAGNOSIS — Z5181 Encounter for therapeutic drug level monitoring: Secondary | ICD-10-CM | POA: Insufficient documentation

## 2019-07-03 DIAGNOSIS — R209 Unspecified disturbances of skin sensation: Secondary | ICD-10-CM | POA: Insufficient documentation

## 2019-07-03 DIAGNOSIS — G894 Chronic pain syndrome: Secondary | ICD-10-CM | POA: Insufficient documentation

## 2019-07-03 NOTE — Progress Notes (Unsigned)
Subjective:    Patient ID: Sara Fox, female    DOB: 1941/01/09, 79 y.o.   MRN: [id number]  HPI  Pain Inventory Average Pain {NUMBERS; 0-10:5044} Pain Right Now {NUMBERS; 0-10:5044} My pain is {PAIN DESCRIPTION:21022940}  In the last 24 hours, has pain interfered with the following? General activity {NUMBERS; 0-10:5044} Relation with others {NUMBERS; 0-10:5044} Enjoyment of life {NUMBERS; 0-10:5044} What TIME of day is your pain at its worst? {TIME OF BJS:28315176} Sleep (in general) {BHH GOOD/FAIR/POOR:22877}  Pain is worse with: {ACTIVITIES:21022942} Pain improves with: {PAIN IMPROVES HYWV:37106269} Relief from Meds: {NUMBERS; 0-10:5044}  Mobility {MOBILITY SWN:46270350}  Function {FUNCTION:21022946}  Neuro/Psych {NEURO/PSYCH:21022948}  Prior Studies {CPRM PRIOR STUDIES:21022953}  Physicians involved in your care {CPRM PHYSICIANS INVOLVED IN YOUR CARE:21022954}   Family History  Problem Relation Age of Onset  . Heart disease Mother        also HTN  . Diabetes Mother   . Colon cancer Mother   . Heart disease Brother        deceased at 77  . Hypertension Brother   . Heart attack Brother   . Heart disease Brother   . Hypertension Brother    Social History   Socioeconomic History  . Marital status: Married    Spouse name: Not on file  . Number of children: 2  . Years of education: GED  . Highest education level: Not on file  Occupational History    Employer: RETIRED  Tobacco Use  . Smoking status: Current Every Day Smoker    Packs/day: 0.50    Years: 60.00    Pack years: 30.00  . Smokeless tobacco: Never Used  . Tobacco comment: currently smokes 1/2ppd or less, she is using nicotine gum  Substance and Sexual Activity  . Alcohol use: No  . Drug use: No  . Sexual activity: Not Currently  Other Topics Concern  . Not on file  Social History Narrative  . Not on file   Social Determinants of Health   Financial Resource Strain: Low Risk     . Difficulty of Paying Living Expenses: Not hard at all  Food Insecurity: No Food Insecurity  . Worried About Charity fundraiser in the Last Year: Never true  . Ran Out of Food in the Last Year: Never true  Transportation Needs: No Transportation Needs  . Lack of Transportation (Medical): No  . Lack of Transportation (Non-Medical): No  Physical Activity: Inactive  . Days of Exercise per Week: 0 days  . Minutes of Exercise per Session: 0 min  Stress: No Stress Concern Present  . Feeling of Stress : Not at all  Social Connections:   . Frequency of Communication with Friends and Family: Not on file  . Frequency of Social Gatherings with Friends and Family: Not on file  . Attends Religious Services: Not on file  . Active Member of Clubs or Organizations: Not on file  . Attends Archivist Meetings: Not on file  . Marital Status: Not on file   Past Surgical History:  Procedure Laterality Date  . ABDOMINAL HYSTERECTOMY    . BACK SURGERY  2012   lower back  . CARDIAC CATHETERIZATION  06/1999   noncritical disease invovling PDA  . CARDIAC CATHETERIZATION N/A 09/14/2014   Procedure: Left Heart Cath and Coronary Angiography;  Surgeon: Leonie Man, MD;  Location: [facility] INVASIVE CV LAB CUPID;  Service: Cardiovascular;  Laterality: N/A;  . CHOLECYSTECTOMY    . ESOPHAGOGASTRODUODENOSCOPY (EGD) WITH PROPOFOL  Left 07/19/2017   Procedure: ESOPHAGOGASTRODUODENOSCOPY (EGD) WITH PROPOFOL;  Surgeon: Ronnette Juniper, MD;  Location: [facility] ENDOSCOPY;  Service: Gastroenterology;  Laterality: Left;  . FRACTURE SURGERY Left 05/2017   left wrist  . HARDWARE REMOVAL Left 11/07/2017   Procedure: LEFT WRIST HARDWARE REMOVAL;  Surgeon: Charlotte Crumb, MD;  Location: [facility];  Service: Orthopedics;  Laterality: Left;  . KNEE SURGERY Bilateral 2001 & 2007  . NECK SURGERY  2012   2012  . PARTIAL GASTRECTOMY  2005   subtotal  . PERCUTANEOUS CORONARY STENT INTERVENTION (PCI-S)  09/14/2014   Procedure:  Percutaneous Coronary Stent Intervention (Pci-S);  Surgeon: Leonie Man, MD;  Location: [facility] INVASIVE CV LAB CUPID;  Service: Cardiovascular;;  . SHOULDER SURGERY Right   . TRANSTHORACIC ECHOCARDIOGRAM  06/02/2010   EF=>55%, normal LV systolic function; normal RV systolic function; mild mitral annular calcif; trace TR; AV mildly sclerotic  . WRIST OSTEOTOMY Left 11/07/2017   Procedure: LEFT WRIST DISTAL ULNA RESECTION WITH EXTENSOR CARPI ULNARIS STABILIZATION;  Surgeon: Charlotte Crumb, MD;  Location: [facility];  Service: Orthopedics;  Laterality: Left;   Past Medical History:  Diagnosis Date  . Acute kidney injury ([place])   . Back pain   . Cervical cancer ([place])   . CHF (congestive heart failure) ([place])   . COPD (chronic obstructive pulmonary disease) ([place])   . Coronary artery disease    s/p DES x2 to RCA 2016  . GERD (gastroesophageal reflux disease)   . Hyperlipidemia   . Hypertension   . Myocardial infarction ([place])   . Neck pain   . Rhabdomyolysis   . Stomach problems    There were no vitals taken for this visit.  Opioid Risk Score:   Fall Risk Score:  `1  Depression screen PHQ 2/9  Depression screen [facility] 2/9 06/30/2019 06/11/2019 04/24/2019 03/27/2019 03/11/2019 12/05/2018 09/02/2018  Decreased Interest 0 0 0 0 0 0 0  Down, Depressed, Hopeless 0 0 0 0 0 0 0  PHQ - 2 Score 0 0 0 0 0 0 0  Altered sleeping - - - - - 0 -  Tired, decreased energy - - - - - 0 -  Change in appetite - - - - - 0 -  Feeling bad or failure about yourself  - - - - - 0 -  Trouble concentrating - - - - - 0 -  Moving slowly or fidgety/restless - - - - - 0 -  Suicidal thoughts - - - - - 0 -  PHQ-9 Score - - - - - 0 -     Review of Systems     Objective:   Physical Exam        Assessment & Plan:

## 2019-07-07 ENCOUNTER — Other Ambulatory Visit: Payer: Self-pay

## 2019-07-07 MED ORDER — ROSUVASTATIN CALCIUM 40 MG PO TABS: 40.0000 mg | ORAL_TABLET | Freq: Every day | ORAL | 8 refills | Status: DC

## 2019-07-08 ENCOUNTER — Telehealth: Payer: Self-pay

## 2019-07-08 NOTE — Telephone Encounter (Signed)
Patients sons girlfriend Katharine Look ( not on [place]) called in and stated patient has had UTI for a while now and it seems to be getting worse, patient has become combative and wanders outside at night and lets the dog out without needing to.

## 2019-07-08 NOTE — Telephone Encounter (Signed)
She needs to come in for a repeat urinalysis to make sure is clear, this may be related to her dementia.

## 2019-07-09 ENCOUNTER — Other Ambulatory Visit: Payer: Self-pay

## 2019-07-09 ENCOUNTER — Encounter: Payer: Self-pay | Admitting: Registered Nurse

## 2019-07-09 ENCOUNTER — Encounter: Payer: Medicare Other | Admitting: Registered Nurse

## 2019-07-09 VITALS — BP 105/67 | HR 100 | Temp 97.2°F | Ht 64.5 in | Wt 129.4 lb

## 2019-07-09 DIAGNOSIS — G8929 Other chronic pain: Secondary | ICD-10-CM | POA: Diagnosis not present

## 2019-07-09 DIAGNOSIS — Z79891 Long term (current) use of opiate analgesic: Secondary | ICD-10-CM

## 2019-07-09 DIAGNOSIS — G894 Chronic pain syndrome: Secondary | ICD-10-CM

## 2019-07-09 DIAGNOSIS — Z5181 Encounter for therapeutic drug level monitoring: Secondary | ICD-10-CM | POA: Diagnosis not present

## 2019-07-09 DIAGNOSIS — M5442 Lumbago with sciatica, left side: Secondary | ICD-10-CM

## 2019-07-09 DIAGNOSIS — M533 Sacrococcygeal disorders, not elsewhere classified: Secondary | ICD-10-CM | POA: Diagnosis not present

## 2019-07-09 DIAGNOSIS — R209 Unspecified disturbances of skin sensation: Secondary | ICD-10-CM | POA: Diagnosis not present

## 2019-07-09 MED ORDER — TRAMADOL HCL 50 MG PO TABS: ORAL_TABLET | ORAL | 0 refills | Status: DC

## 2019-07-09 NOTE — Patient Instructions (Signed)
Sara Fox:   Call [facility] on 07/28/2019 to evaluate medication:   [phone]

## 2019-07-09 NOTE — Progress Notes (Signed)
Subjective:    Patient ID: Sara Fox, female    DOB: 02/06/1941, 79 y.o.   MRN: [id number]  HPI: Sara Fox is a 79 y.o. female who returns for follow up appointment for chronic pain and medication refill. She states her pain is located in her lower back and bilateral lower extremities. She rates her pain 4. Her current exercise regime is walking.   [name] in the room and states his mother is having memory changes and she has an appointment with her PCP in the morning. He was instructed to provide 24 hour supervise care, he verbalizes understanding. Also states he is dispensing [name] medication.  Tramadol e-scribed today with no refills, Sara Fox was instructed to call office in two weeks to evaluate medication, he verbalizes understanding.   UDS ordered today.    Pain Inventory Average Pain 4 Pain Right Now 4 My pain is dull and aching  In the last 24 hours, has pain interfered with the following? General activity 4 Relation with others 4 Enjoyment of life 4 What TIME of day is your pain at its worst? varies Sleep (in general) Fair  Pain is worse with: some activites Pain improves with: medication Relief from Meds: 4  Mobility use a cane use a walker ability to climb steps?  yes do you drive?  no  Function retired I need assistance with the following:  bathing, meal prep, household duties and shopping  Neuro/Psych bladder control problems trouble walking confusion  Prior Studies Any changes since last visit?  no  Physicians involved in your care Any changes since last visit?  yes Primary care Sara Brine, FNP   Family History  Problem Relation Age of Onset  . Heart disease Mother        also HTN  . Diabetes Mother   . Colon cancer Mother   . Heart disease Brother        deceased at 53  . Hypertension Brother   . Heart attack Brother   . Heart disease Brother   . Hypertension Brother    Social History   Socioeconomic  History  . Marital status: Married    Spouse name: Not on file  . Number of children: 2  . Years of education: GED  . Highest education level: Not on file  Occupational History    Employer: RETIRED  Tobacco Use  . Smoking status: Current Every Day Smoker    Packs/day: 0.50    Years: 60.00    Pack years: 30.00  . Smokeless tobacco: Never Used  . Tobacco comment: currently smokes 1/2ppd or less, she is using nicotine gum  Substance and Sexual Activity  . Alcohol use: No  . Drug use: No  . Sexual activity: Not Currently  Other Topics Concern  . Not on file  Social History Narrative  . Not on file   Social Determinants of Health   Financial Resource Strain: Low Risk   . Difficulty of Paying Living Expenses: Not hard at all  Food Insecurity: No Food Insecurity  . Worried About Charity fundraiser in the Last Year: Never true  . Ran Out of Food in the Last Year: Never true  Transportation Needs: No Transportation Needs  . Lack of Transportation (Medical): No  . Lack of Transportation (Non-Medical): No  Physical Activity: Inactive  . Days of Exercise per Week: 0 days  . Minutes of Exercise per Session: 0 min  Stress: No Stress Concern  Present  . Feeling of Stress : Not at all  Social Connections:   . Frequency of Communication with Friends and Family: Not on file  . Frequency of Social Gatherings with Friends and Family: Not on file  . Attends Religious Services: Not on file  . Active Member of Clubs or Organizations: Not on file  . Attends Archivist Meetings: Not on file  . Marital Status: Not on file   Past Surgical History:  Procedure Laterality Date  . ABDOMINAL HYSTERECTOMY    . BACK SURGERY  2012   lower back  . CARDIAC CATHETERIZATION  06/1999   noncritical disease invovling PDA  . CARDIAC CATHETERIZATION N/A 09/14/2014   Procedure: Left Heart Cath and Coronary Angiography;  Surgeon: Leonie Man, MD;  Location: [facility] INVASIVE CV LAB CUPID;  Service:  Cardiovascular;  Laterality: N/A;  . CHOLECYSTECTOMY    . ESOPHAGOGASTRODUODENOSCOPY (EGD) WITH PROPOFOL Left 07/19/2017   Procedure: ESOPHAGOGASTRODUODENOSCOPY (EGD) WITH PROPOFOL;  Surgeon: Ronnette Juniper, MD;  Location: [facility] ENDOSCOPY;  Service: Gastroenterology;  Laterality: Left;  . FRACTURE SURGERY Left 05/2017   left wrist  . HARDWARE REMOVAL Left 11/07/2017   Procedure: LEFT WRIST HARDWARE REMOVAL;  Surgeon: Charlotte Crumb, MD;  Location: [place];  Service: Orthopedics;  Laterality: Left;  . KNEE SURGERY Bilateral 2001 & 2007  . NECK SURGERY  2012   2012  . PARTIAL GASTRECTOMY  2005   subtotal  . PERCUTANEOUS CORONARY STENT INTERVENTION (PCI-S)  09/14/2014   Procedure: Percutaneous Coronary Stent Intervention (Pci-S);  Surgeon: Leonie Man, MD;  Location: [facility] INVASIVE CV LAB CUPID;  Service: Cardiovascular;;  . SHOULDER SURGERY Right   . TRANSTHORACIC ECHOCARDIOGRAM  06/02/2010   EF=>55%, normal LV systolic function; normal RV systolic function; mild mitral annular calcif; trace TR; AV mildly sclerotic  . WRIST OSTEOTOMY Left 11/07/2017   Procedure: LEFT WRIST DISTAL ULNA RESECTION WITH EXTENSOR CARPI ULNARIS STABILIZATION;  Surgeon: Charlotte Crumb, MD;  Location: [place];  Service: Orthopedics;  Laterality: Left;   Past Medical History:  Diagnosis Date  . Acute kidney injury ([place])   . Back pain   . Cervical cancer ([place])   . CHF (congestive heart failure) ([place])   . COPD (chronic obstructive pulmonary disease) ([place])   . Coronary artery disease    s/p DES x2 to RCA 2016  . GERD (gastroesophageal reflux disease)   . Hyperlipidemia   . Hypertension   . Myocardial infarction ([place])   . Neck pain   . Rhabdomyolysis   . Stomach problems    BP 105/67   Pulse 100   Temp (!) 97.2 F (36.2 C)   Ht 5' 4.5" (1.638 m)   Wt 129 lb 6.4 oz (58.7 kg)   SpO2 99%   BMI 21.87 kg/m   Opioid Risk Score:   Fall Risk Score:  `1  Depression screen PHQ 2/9  Depression screen [facility] 2/9  06/30/2019 06/11/2019 04/24/2019 03/27/2019 03/11/2019 12/05/2018 09/02/2018  Decreased Interest 0 0 0 0 0 0 0  Down, Depressed, Hopeless 0 0 0 0 0 0 0  PHQ - 2 Score 0 0 0 0 0 0 0  Altered sleeping - - - - - 0 -  Tired, decreased energy - - - - - 0 -  Change in appetite - - - - - 0 -  Feeling bad or failure about yourself  - - - - - 0 -  Trouble concentrating - - - - - 0 -  Moving  slowly or fidgety/restless - - - - - 0 -  Suicidal thoughts - - - - - 0 -  PHQ-9 Score - - - - - 0 -    Review of Systems     Objective:   Physical Exam Vitals and nursing note reviewed.  Constitutional:      Appearance: Normal appearance.     Comments: A&O x 2  Cardiovascular:     Rate and Rhythm: Normal rate and regular rhythm.     Pulses: Normal pulses.     Heart sounds: Normal heart sounds.  Pulmonary:     Effort: Pulmonary effort is normal.     Breath sounds: Normal breath sounds.  Musculoskeletal:     Cervical back: Normal range of motion and neck supple.     Comments: Normal Muscle Bulk and Muscle Testing Reveals:  Upper Extremities: Decreased ROM 45 Degrees  and Muscle Strength 5/5 Lower Extremities: Full ROM and Muscle Strength 5/5 Arises from Table slowly  Narrow Based  Gait   Skin:    General: Skin is warm and dry.  Neurological:     Mental Status: She is alert and oriented to person, place, and time.  Psychiatric:        Mood and Affect: Mood normal.        Behavior: Behavior normal.           Assessment & Plan:  1. Cervical Postlaminectomy syndrome: Continue HEP as Tolerated. Continue to monitor. 07/09/2019 2. Bilateral sacroiliac pain after lumbar fusion.  Refilled:Tramadol 50 mg one - 2 tablets 3 times daily as needed #140 07/09/2019  20 minutes of face to face patient care time was spent during this visit. All questions were encouraged and answered.  F/U in 6 months

## 2019-07-10 ENCOUNTER — Ambulatory Visit (INDEPENDENT_AMBULATORY_CARE_PROVIDER_SITE_OTHER): Payer: Medicare Other | Admitting: Nurse Practitioner

## 2019-07-10 ENCOUNTER — Encounter: Payer: Self-pay | Admitting: Nurse Practitioner

## 2019-07-10 VITALS — BP 114/64 | HR 67 | Temp 97.7°F | Ht 64.4 in | Wt 128.0 lb

## 2019-07-10 DIAGNOSIS — G3184 Mild cognitive impairment, so stated: Secondary | ICD-10-CM

## 2019-07-10 DIAGNOSIS — N390 Urinary tract infection, site not specified: Secondary | ICD-10-CM | POA: Diagnosis not present

## 2019-07-10 DIAGNOSIS — G4700 Insomnia, unspecified: Secondary | ICD-10-CM

## 2019-07-10 DIAGNOSIS — F919 Conduct disorder, unspecified: Secondary | ICD-10-CM

## 2019-07-10 DIAGNOSIS — Z79891 Long term (current) use of opiate analgesic: Secondary | ICD-10-CM | POA: Diagnosis not present

## 2019-07-10 DIAGNOSIS — D509 Iron deficiency anemia, unspecified: Secondary | ICD-10-CM | POA: Diagnosis not present

## 2019-07-10 DIAGNOSIS — Z5181 Encounter for therapeutic drug level monitoring: Secondary | ICD-10-CM | POA: Diagnosis not present

## 2019-07-10 DIAGNOSIS — G894 Chronic pain syndrome: Secondary | ICD-10-CM | POA: Diagnosis not present

## 2019-07-10 LAB — POCT URINALYSIS DIPSTICK
Blood, UA: NEGATIVE
Glucose, UA: NEGATIVE
Ketones, UA: 15
Leukocytes, UA: NEGATIVE
Nitrite, UA: NEGATIVE
Protein, UA: POSITIVE — AB
Spec Grav, UA: 1.025 (ref 1.010–1.025)
Urobilinogen, UA: 2 E.U./dL — AB
pH, UA: 5.5 (ref 5.0–8.0)

## 2019-07-10 MED ORDER — DONEPEZIL HCL 5 MG PO TABS: 5.0000 mg | ORAL_TABLET | Freq: Every day | ORAL | 2 refills | Status: DC

## 2019-07-10 MED ORDER — MAGNESIUM 250 MG PO TABS: ORAL_TABLET | ORAL | 3 refills | Status: DC

## 2019-07-10 NOTE — Progress Notes (Signed)
This visit occurred during the SARS-CoV-2 public health emergency.  Safety protocols were in place, including screening questions prior to the visit, additional usage of staff PPE, and extensive cleaning of exam room while observing appropriate contact time as indicated for disinfecting solutions.  Subjective:     Patient ID: Sara Fox , female    DOB: Sep 16, 1940 , 79 y.o.   MRN: [id number]   Chief Complaint  Patient presents with  . Urinary Tract Infection  . Dementia    HPI  She returns today due to having more confusion after being treated for a urinary tract infection.  She started to get combative towards her son.  She was letting the dogs out. She is becoming more aggressive and angry.  Her son reports at 4am walking around the house with one shoe on and one shoe off with a sheet wrapped around her stating "looking for her shoes".    She is also messing up her brief.  She is also staying up for 24 hours at times, rambling, hiding things from herself, unplugging appliances and tv's. He feels this comes and goes and when he tries to talk about she will get angry with her son. She had her chair outside over a "hump", she was trying to get the people in the house.  He feels the way she is acting worsened with her most recent UTI.   He works from 4p - 12 am so he is not home with her during that time.   Urinary Tract Infection  This is a recurrent problem. The current episode started more than 1 year ago. The problem occurs intermittently. The problem has been unchanged. There has been no fever. Pertinent negatives include no chills or hematuria.     Past Medical History:  Diagnosis Date  . Acute kidney injury ([place])   . Back pain   . Cervical cancer ([place])   . CHF (congestive heart failure) ([place])   . COPD (chronic obstructive pulmonary disease) ([place])   . Coronary artery disease    s/p DES x2 to RCA 2016  . GERD (gastroesophageal reflux disease)   . Hyperlipidemia   .  Hypertension   . Myocardial infarction ([place])   . Neck pain   . Rhabdomyolysis   . Stomach problems      Family History  Problem Relation Age of Onset  . Heart disease Mother        also HTN  . Diabetes Mother   . Colon cancer Mother   . Heart disease Brother        deceased at 50  . Hypertension Brother   . Heart attack Brother   . Heart disease Brother   . Hypertension Brother      Current Outpatient Medications:  .  aspirin EC 81 MG tablet, Take 81 mg by mouth daily., Disp: , Rfl:  .  budesonide-formoterol (SYMBICORT) 160-4.5 MCG/ACT inhaler, Inhale 2 puffs into the lungs 2 (two) times daily., Disp: , Rfl:  .  CALCIUM CITRATE PO, Take 1 tablet by mouth daily. , Disp: , Rfl:  .  Cholecalciferol (VITAMIN D3) 2000 UNITS capsule, Take 2,000 Units by mouth daily., Disp: , Rfl:  .  ciclopirox (PENLAC) 8 % solution, Apply topically at bedtime. Apply over nail and surrounding skin. Apply daily over previous coat. After seven (7) days, file nail and continue cycle., Disp: 6.6 mL, Rfl: 0 .  furosemide (LASIX) 20 MG tablet, TAKE 1 TABLET BY MOUTH EVERY OTHER DAY, Disp:  45 tablet, Rfl: 1 .  gabapentin (NEURONTIN) 100 MG capsule, TAKE 1 CAPSULE BY MOUTH THREE TIMES DAILY, Disp: 270 capsule, Rfl: 0 .  metoprolol tartrate (LOPRESSOR) 25 MG tablet, TAKE 1 TABLET BY MOUTH TWICE DAILY WITH A MEAL, Disp: 180 tablet, Rfl: 1 .  Multiple Vitamin (MULTIVITAMIN) capsule, Take 1 capsule by mouth daily. Contains iron, Disp: , Rfl:  .  nitroGLYCERIN (NITROSTAT) 0.4 MG SL tablet, PLACE ONE TABLET UNDER THE TONGUE EVERY 5 MINUTES AS NEEDED FOR CHEST PAIN., Disp: 25 tablet, Rfl: 0 .  oxybutynin (DITROPAN) 5 MG tablet, Take 1/2 (one-half) tablet by mouth twice daily, Disp: 30 tablet, Rfl: 0 .  pantoprazole (PROTONIX) 40 MG tablet, Take 1 tablet by mouth twice daily, Disp: 180 tablet, Rfl: 1 .  polyethylene glycol (MIRALAX / GLYCOLAX) packet, Take 17 g by mouth daily as needed for mild constipation. , Disp: ,  Rfl:  .  Potassium Chloride ER 20 MEQ TBCR, TAKE 2 TABLETS BY MOUTH IN THE MORNING, Disp: 60 tablet, Rfl: 11 .  rosuvastatin (CRESTOR) 40 MG tablet, Take 1 tablet (40 mg total) by mouth daily., Disp: 30 tablet, Rfl: 8 .  traMADol (ULTRAM) 50 MG tablet, TAKE 1-2 TABLETS THREE TIMES DAILY. DO NOT FILL THIS PRESCRIPTION UNTIL MS. [facility] CALLS FOR REFILL., Disp: 140 tablet, Rfl: 0 .  vitamin C (ASCORBIC ACID) 500 MG tablet, Take 500 mg by mouth daily., Disp: , Rfl:    Allergies  Allergen Reactions  . Buprenorphine Hcl Shortness Of Breath    Throat swelling/trouble breathing and lethargic  . Morphine And Related Shortness Of Breath    Throat swelling/trouble breathing and lethargic  . Celebrex [Celecoxib] Diarrhea and Swelling    Swelling of legs   . Vioxx [Rofecoxib] Palpitations  . Codeine Other (See Comments)    Bloated      Review of Systems  Constitutional: Negative for chills.  Genitourinary: Negative for hematuria.     Today's Vitals   07/10/19 1029  BP: 114/64  Pulse: 67  Temp: 97.7 F (36.5 C)  SpO2: 99%  Weight: 128 lb (58.1 kg)  Height: 5' 4.4" (1.636 m)   Body mass index is 21.7 kg/m.   Objective:  Physical Exam Constitutional:      General: She is not in acute distress.    Appearance: Normal appearance.  Cardiovascular:     Rate and Rhythm: Normal rate and regular rhythm.     Pulses: Normal pulses.     Heart sounds: Normal heart sounds. No murmur.  Pulmonary:     Effort: Pulmonary effort is normal. No respiratory distress.     Breath sounds: Normal breath sounds.  Skin:    Capillary Refill: Capillary refill takes less than 2 seconds.  Neurological:     General: No focal deficit present.     Mental Status: She is alert and oriented to person, place, and time.     Cranial Nerves: No cranial nerve deficit.  Psychiatric:        Mood and Affect: Mood normal.        Behavior: Behavior normal.        Thought Content: Thought content normal.        Judgment:  Judgment normal.     Comments: She is not as talkative today and she has some mild disorientation on what has occurred over the last few days         Assessment And Plan:     1. Mild cognitive impairment with memory  loss  This seems to be worsening since she had her urinary tract infection  Will start her on donepezil   I have also talked with her son to make him aware that sleep deprivation can affect her memory and behaviors as well.  - donepezil (ARICEPT) 5 MG tablet; Take 1 tablet (5 mg total) by mouth at bedtime.  Dispense: 30 tablet; Refill: 2 - Magnesium 250 MG TABS; Take 1 tablet by mouth with evening meal daily  Dispense: 30 tablet; Refill: 3 - BMP8+eGFR - CBC - T pallidum Screening Cascade - TSH - Referral to Chronic Care Management Services  2. Urinary tract infection without hematuria, site unspecified  Urinalysis does not have nitrates  She has small amount of ketones likely from dehydration I have advised her to push fluids particularly water.  - Culture, Urine - BMP8+eGFR - CBC  3. Insomnia, unspecified type  If not better in 2 weeks let us know we may need to stat an additional medication. - Magnesium 250 MG TABS; Take 1 tablet by mouth with evening meal daily  Dispense: 30 tablet; Refill: 3  4. Behavior disturbance  Likely related to worsening dementia  Will refer also for CCM social work (resources) and Marine scientist for disease progression education. - BMP8+eGFR - CBC    [name], FNP    THE PATIENT IS ENCOURAGED TO PRACTICE SOCIAL DISTANCING DUE TO THE COVID-19 PANDEMIC.

## 2019-07-11 LAB — BMP8+EGFR
BUN/Creatinine Ratio: 15 (ref 12–28)
BUN: 21 mg/dL (ref 8–27)
CO2: 19 mmol/L — ABNORMAL LOW (ref 20–29)
Calcium: 9.5 mg/dL (ref 8.7–10.3)
Chloride: 109 mmol/L — ABNORMAL HIGH (ref 96–106)
Creatinine, Ser: 1.38 mg/dL — ABNORMAL HIGH (ref 0.57–1.00)
GFR calc Af Amer: 42 mL/min/{1.73_m2} — ABNORMAL LOW (ref >59)
GFR calc non Af Amer: 36 mL/min/{1.73_m2} — ABNORMAL LOW (ref >59)
Glucose: 88 mg/dL (ref 65–99)
Potassium: 4.4 mmol/L (ref 3.5–5.2)
Sodium: 142 mmol/L (ref 134–144)

## 2019-07-11 LAB — CBC
Hematocrit: 29.6 % — ABNORMAL LOW (ref 34.0–46.6)
Hemoglobin: 9.8 g/dL — ABNORMAL LOW (ref 11.1–15.9)
MCH: 30.4 pg (ref 26.6–33.0)
MCHC: 33.1 g/dL (ref 31.5–35.7)
MCV: 92 fL (ref 79–97)
Platelets: 297 10*3/uL (ref 150–450)
RBC: 3.22 x10E6/uL — ABNORMAL LOW (ref 3.77–5.28)
RDW: 12 % (ref 11.7–15.4)
WBC: 5.9 10*3/uL (ref 3.4–10.8)

## 2019-07-11 LAB — T PALLIDUM SCREENING CASCADE: T pallidum Antibodies (TP-PA): NONREACTIVE

## 2019-07-11 LAB — TSH: TSH: 0.77 u[IU]/mL (ref 0.450–4.500)

## 2019-07-12 LAB — URINE CULTURE

## 2019-07-14 LAB — TOXASSURE SELECT,+ANTIDEPR,UR

## 2019-07-15 ENCOUNTER — Ambulatory Visit: Payer: Medicare Other | Admitting: Nurse Practitioner

## 2019-07-15 LAB — IRON AND TIBC
Iron Saturation: 28 % (ref 15–55)
Iron: 70 ug/dL (ref 27–139)
Total Iron Binding Capacity: 249 ug/dL — ABNORMAL LOW (ref 250–450)
UIBC: 179 ug/dL (ref 118–369)

## 2019-07-15 LAB — SPECIMEN STATUS REPORT

## 2019-07-15 LAB — FERRITIN: Ferritin: 583 ng/mL — ABNORMAL HIGH (ref 15–150)

## 2019-07-17 ENCOUNTER — Telehealth: Payer: Self-pay

## 2019-07-17 NOTE — Telephone Encounter (Signed)
Patient's son stated she is doing much better and she is taking an iron supplement as well. YRL,RMA   Your iron studies are essentially normal. Is she taking an iron supplement daily? How is she doing?

## 2019-07-18 ENCOUNTER — Other Ambulatory Visit: Payer: Self-pay

## 2019-07-18 ENCOUNTER — Ambulatory Visit (INDEPENDENT_AMBULATORY_CARE_PROVIDER_SITE_OTHER): Payer: Medicare Other

## 2019-07-18 ENCOUNTER — Telehealth: Payer: Self-pay

## 2019-07-18 ENCOUNTER — Ambulatory Visit: Payer: Self-pay

## 2019-07-18 DIAGNOSIS — I5022 Chronic systolic (congestive) heart failure: Secondary | ICD-10-CM | POA: Diagnosis not present

## 2019-07-18 DIAGNOSIS — N1831 Chronic kidney disease, stage 3a: Secondary | ICD-10-CM | POA: Diagnosis not present

## 2019-07-18 DIAGNOSIS — I10 Essential (primary) hypertension: Secondary | ICD-10-CM

## 2019-07-18 DIAGNOSIS — G3184 Mild cognitive impairment, so stated: Secondary | ICD-10-CM

## 2019-07-18 NOTE — Patient Instructions (Signed)
Social Worker Visit Information  Goals we discussed today:  Goals Addressed            This Visit's Progress   . Collaborate with RN Case Manager to assist with care coordination needs       CARE PLAN ENTRY (see longtitudinal plan of care for additional care plan information)  Current Barriers:  . Social Isolation . Memory Deficits . HTN, CHF, CKD III, mild cognitive impairment  Social Work Clinical Goal(s):  Marland Kitchen Over the next 90 days, patient will work with care management team to address needs related to care coordination needs and caregiver asistance.  CCM SW Interventions: Completed 07/18/2019 with patients son [name] . Outbound call placed to the patients son and caregiver [name] to introduce care management program . Performed chart review to note recent medication changes to assist with patients behavior changes o [name] reports the patient is improving with added medications and is less agitated o Patient still has days and nights confused o [name] works 4pm - 12 am but has family who check on the patient while he is not home . Discussed opportunity to enroll patient in an adult day program to assist with engagement during the day and hopefully allow the patient to sleep better at night . Mailed [name] information on [facility] [facility] program as well as [facility] . Collaboration with RN Case Manager regarding patient enrollment status  Patient Self Care Activities:  . Attends all scheduled provider appointments . Calls provider office for new concerns or questions . Supportive family to assist with patient care needs  Initial goal documentation         Materials provided: Yes: mailed resource information on local adult day programs  Ms. Gritz was given information about Chronic Care Management services today including:  1. CCM service includes personalized support from designated clinical staff supervised by her physician, including  individualized plan of care and coordination with other care providers 2. 24/7 contact phone numbers for assistance for urgent and routine care needs. 3. Service will only be billed when office clinical staff spend 20 minutes or more in a month to coordinate care. 4. Only one practitioner may furnish and bill the service in a calendar month. 5. The patient may stop CCM services at any time (effective at the end of the month) by phone call to the office staff. 6. The patient will be responsible for cost sharing (co-pay) of up to 20% of the service fee (after annual deductible is met).  Patient agreed to services and verbal consent obtained.   The patient verbalized understanding of instructions provided today and declined a print copy of patient instruction materials.   Follow up plan: SW will follow up with patient by phone over the next month   [name], BSW, CDP Social Worker, Certified Dementia Practitioner [facility] [phone]

## 2019-07-18 NOTE — Chronic Care Management (AMB) (Signed)
Chronic Care Management   Initial Visit Note  07/18/2019 Name: Sara Fox MRN: [id number] DOB: 04-06-1941  Referred by: [name], FNP Reason for referral : Chronic Care Management (RQ Initial Case Collaboration )   Sara Fox is a 79 y.o. year old female who is a primary care patient of [name], [place]. The care management team was consulted for assistance with chronic disease management and care coordination needs related to CHF, HTN and CKD Stage III.  Review of patient status, including review of consultants reports, relevant laboratory and other test results, and collaboration with appropriate care team members and the patient's provider was performed as part of comprehensive patient evaluation and provision of chronic care management services.    I initiated and established the plan of care for Sara Fox during one on one collaboration with my clinical care management colleague [name] BSW who is also engaged with this patient to address social work needs.   Outpatient Encounter Medications as of 07/18/2019  Medication Sig  . aspirin EC 81 MG tablet Take 81 mg by mouth daily.  . budesonide-formoterol (SYMBICORT) 160-4.5 MCG/ACT inhaler Inhale 2 puffs into the lungs 2 (two) times daily.  Marland Kitchen CALCIUM CITRATE PO Take 1 tablet by mouth daily.   . Cholecalciferol (VITAMIN D3) 2000 UNITS capsule Take 2,000 Units by mouth daily.  . ciclopirox (PENLAC) 8 % solution Apply topically at bedtime. Apply over nail and surrounding skin. Apply daily over previous coat. After seven (7) days, file nail and continue cycle.  . donepezil (ARICEPT) 5 MG tablet Take 1 tablet (5 mg total) by mouth at bedtime.  . furosemide (LASIX) 20 MG tablet TAKE 1 TABLET BY MOUTH EVERY OTHER DAY  . gabapentin (NEURONTIN) 100 MG capsule TAKE 1 CAPSULE BY MOUTH THREE TIMES DAILY  . Magnesium 250 MG TABS Take 1 tablet by mouth with evening meal daily  . metoprolol tartrate (LOPRESSOR) 25 MG tablet TAKE  1 TABLET BY MOUTH TWICE DAILY WITH A MEAL  . Multiple Vitamin (MULTIVITAMIN) capsule Take 1 capsule by mouth daily. Contains iron  . nitroGLYCERIN (NITROSTAT) 0.4 MG SL tablet PLACE ONE TABLET UNDER THE TONGUE EVERY 5 MINUTES AS NEEDED FOR CHEST PAIN.  Marland Kitchen oxybutynin (DITROPAN) 5 MG tablet Take 1/2 (one-half) tablet by mouth twice daily  . pantoprazole (PROTONIX) 40 MG tablet Take 1 tablet by mouth twice daily  . polyethylene glycol (MIRALAX / GLYCOLAX) packet Take 17 g by mouth daily as needed for mild constipation.   . Potassium Chloride ER 20 MEQ TBCR TAKE 2 TABLETS BY MOUTH IN THE MORNING  . rosuvastatin (CRESTOR) 40 MG tablet Take 1 tablet (40 mg total) by mouth daily.  . traMADol (ULTRAM) 50 MG tablet TAKE 1-2 TABLETS THREE TIMES DAILY. DO NOT FILL THIS PRESCRIPTION UNTIL [facility] CALLS FOR REFILL.  Marland Kitchen vitamin C (ASCORBIC ACID) 500 MG tablet Take 500 mg by mouth daily.   No facility-administered encounter medications on file as of 07/18/2019.     Goals Addressed    . Assist with Chronic Care Management and Care Coordination needs       CARE PLAN ENTRY (see longtitudinal plan of care for additional care plan information)   Current Barriers:  . Chronic Disease Management support, education, and care coordination needs related to CHF, HTN, and CKD Stage III  Case Manager Clinical Goal(s):  Marland Kitchen Over the next 90 days, patient will work with the CCM team to address needs related to Chronic Care Management and  Care Coordination needs in patient with CHF, HTN, and CKD Stage III  Interventions:  . Collaborated with BSW to initiate plan of care to address needs related to Lacks knowledge of community resource:    in patient with CHF, HTN, and CKD Stage III  Patient Self Care Activities:  . Attends all scheduled provider appointments . Calls provider office for new concerns or questions . Supportive family to assist with patient care needs  Initial goal documentation         Telephone  follow up appointment with care management team member scheduled for: 08/13/19  Barb Merino, RN, BSN, CCM Care Management Coordinator [place] [facility]  Direct Phone: [phone]

## 2019-07-18 NOTE — Chronic Care Management (AMB) (Signed)
Chronic Care Management    Social Work General Note  07/18/2019 Name: Sara Fox MRN: [id number] DOB: [birth year]  Sara Fox is a 79 y.o. year old female who is a primary care patient of [name], [place]. The CCM was consulted to assist the patient with care coordination.  I reached out to Sara Fox, son and caregiver of Sara Fox to discuss recent referral to Chronic Care Management.    Sara Fox was given information about Chronic Care Management services today including:  1. CCM service includes personalized support from designated clinical staff supervised by her physician, including individualized plan of care and coordination with other care providers 2. 24/7 contact phone numbers for assistance for urgent and routine care needs. 3. Service will only be billed when office clinical staff spend 20 minutes or more in a month to coordinate care. 4. Only one practitioner may furnish and bill the service in a calendar month. 5. The patient may stop CCM services at any time (effective at the end of the month) by phone call to the office staff. 6. The patient will be responsible for cost sharing (co-pay) of up to 20% of the service fee (after annual deductible is met).  Sara Fox agreed to services on behalf of the patient and verbal consent obtained.   Review of patient status, including review of consultants reports, relevant laboratory and other test results, and collaboration with appropriate care team members and the patient's provider was performed as part of comprehensive patient evaluation and provision of chronic care management services.    SDOH (Social Determinants of Health) assessments and interventions performed:  No    Outpatient Encounter Medications as of 07/18/2019  Medication Sig  . aspirin EC 81 MG tablet Take 81 mg by mouth daily.  . budesonide-formoterol (SYMBICORT) 160-4.5 MCG/ACT inhaler Inhale 2 puffs into the lungs 2 (two) times daily.  Marland Kitchen CALCIUM  CITRATE PO Take 1 tablet by mouth daily.   . Cholecalciferol (VITAMIN D3) 2000 UNITS capsule Take 2,000 Units by mouth daily.  . ciclopirox (PENLAC) 8 % solution Apply topically at bedtime. Apply over nail and surrounding skin. Apply daily over previous coat. After seven (7) days, file nail and continue cycle.  . donepezil (ARICEPT) 5 MG tablet Take 1 tablet (5 mg total) by mouth at bedtime.  . furosemide (LASIX) 20 MG tablet TAKE 1 TABLET BY MOUTH EVERY OTHER DAY  . gabapentin (NEURONTIN) 100 MG capsule TAKE 1 CAPSULE BY MOUTH THREE TIMES DAILY  . Magnesium 250 MG TABS Take 1 tablet by mouth with evening meal daily  . metoprolol tartrate (LOPRESSOR) 25 MG tablet TAKE 1 TABLET BY MOUTH TWICE DAILY WITH A MEAL  . Multiple Vitamin (MULTIVITAMIN) capsule Take 1 capsule by mouth daily. Contains iron  . nitroGLYCERIN (NITROSTAT) 0.4 MG SL tablet PLACE ONE TABLET UNDER THE TONGUE EVERY 5 MINUTES AS NEEDED FOR CHEST PAIN.  Marland Kitchen oxybutynin (DITROPAN) 5 MG tablet Take 1/2 (one-half) tablet by mouth twice daily  . pantoprazole (PROTONIX) 40 MG tablet Take 1 tablet by mouth twice daily  . polyethylene glycol (MIRALAX / GLYCOLAX) packet Take 17 g by mouth daily as needed for mild constipation.   . Potassium Chloride ER 20 MEQ TBCR TAKE 2 TABLETS BY MOUTH IN THE MORNING  . rosuvastatin (CRESTOR) 40 MG tablet Take 1 tablet (40 mg total) by mouth daily.  . traMADol (ULTRAM) 50 MG tablet TAKE 1-2 TABLETS THREE TIMES DAILY. DO NOT FILL THIS PRESCRIPTION UNTIL MS. Hari CALLS  FOR REFILL.  Marland Kitchen vitamin C (ASCORBIC ACID) 500 MG tablet Take 500 mg by mouth daily.   No facility-administered encounter medications on file as of 07/18/2019.    Goals Addressed            This Visit's Progress   . Collaborate with RN Case Manager to assist with care coordination needs       CARE PLAN ENTRY (see longtitudinal plan of care for additional care plan information)  Current Barriers:  . Social Isolation . Memory  Deficits . HTN, CHF, CKD III, mild cognitive impairment  Social Work Clinical Goal(s):  Marland Kitchen Over the next 90 days, patient will work with care management team to address needs related to care coordination needs and caregiver asistance.  CCM SW Interventions: Completed 07/18/2019 with patients son Sara Fox . Outbound call placed to the patients son and caregiver Sara Fox to introduce care management program . Performed chart review to note recent medication changes to assist with patients behavior changes o Sara Fox reports the patient is improving with added medications and is less agitated o Patient still has days and nights confused o Sara Fox works 4pm - 12 am but has family who check on the patient while he is not home . Discussed opportunity to enroll patient in an adult day program to assist with engagement during the day and hopefully allow the patient to sleep better at night . Mailed Sara Fox information on [facility] program as well as [facility] . Collaboration with RN Case Manager regarding patient enrollment status  Patient Self Care Activities:  . Attends all scheduled provider appointments . Calls provider office for new concerns or questions . Supportive family to assist with patient care needs  Initial goal documentation         Follow Up Plan: SW will follow up with patient by phone over the next month.       Sara Fox, BSW, CDP Social Worker, Certified Dementia Practitioner [place] [facility] [place] [facility] [phone]  Total time spent performing care coordination and/or care management activities with the patient by phone or face to face = 14 minutes.

## 2019-07-21 ENCOUNTER — Telehealth: Payer: Self-pay

## 2019-07-21 NOTE — Telephone Encounter (Signed)
Oral swab drug screen was consistent for prescribed medications.  ?

## 2019-07-24 DIAGNOSIS — N189 Chronic kidney disease, unspecified: Secondary | ICD-10-CM | POA: Diagnosis not present

## 2019-07-24 DIAGNOSIS — D631 Anemia in chronic kidney disease: Secondary | ICD-10-CM | POA: Diagnosis not present

## 2019-07-24 DIAGNOSIS — N2581 Secondary hyperparathyroidism of renal origin: Secondary | ICD-10-CM | POA: Diagnosis not present

## 2019-07-24 DIAGNOSIS — N183 Chronic kidney disease, stage 3 unspecified: Secondary | ICD-10-CM | POA: Diagnosis not present

## 2019-07-24 DIAGNOSIS — I129 Hypertensive chronic kidney disease with stage 1 through stage 4 chronic kidney disease, or unspecified chronic kidney disease: Secondary | ICD-10-CM | POA: Diagnosis not present

## 2019-08-07 ENCOUNTER — Telehealth: Payer: Self-pay

## 2019-08-07 ENCOUNTER — Ambulatory Visit: Payer: Medicare Other | Admitting: Nurse Practitioner

## 2019-08-07 ENCOUNTER — Other Ambulatory Visit (HOSPITAL_COMMUNITY): Payer: Self-pay

## 2019-08-07 ENCOUNTER — Ambulatory Visit: Payer: Medicare Other

## 2019-08-07 NOTE — Telephone Encounter (Signed)
This nurse attempted to reach patient in regards to no show for our scheduled appointment. Message was left to call office in order to update.

## 2019-08-08 ENCOUNTER — Other Ambulatory Visit: Payer: Self-pay

## 2019-08-08 ENCOUNTER — Telehealth: Payer: Self-pay

## 2019-08-08 ENCOUNTER — Ambulatory Visit (HOSPITAL_COMMUNITY)
Admission: RE | Admit: 2019-08-08 | Discharge: 2019-08-08 | Disposition: A | Discharge status: Home / Self Care | Payer: Medicare Other | Source: Ambulatory Visit | Attending: Nephrology | Admitting: Nephrology

## 2019-08-08 DIAGNOSIS — D631 Anemia in chronic kidney disease: Secondary | ICD-10-CM | POA: Diagnosis not present

## 2019-08-08 MED ORDER — TRAMADOL HCL 50 MG PO TABS: ORAL_TABLET | ORAL | 1 refills | Status: DC

## 2019-08-08 MED ORDER — SODIUM CHLORIDE 0.9 % IV SOLN
510.0000 mg | Freq: Once | INTRAVENOUS | Status: AC
  Administered 2019-08-08: 510 mg via INTRAVENOUS
  Filled 2019-08-08: qty 17

## 2019-08-08 NOTE — Telephone Encounter (Signed)
This provider returned Mr. Sara Fox call, he reports he is dispensing his mother [name] medication. PMP was reviewed. Tramadol e-scribed. He verbalizes understanding.

## 2019-08-08 NOTE — Telephone Encounter (Signed)
Patients contact called requesting a refill of tramadol medication.  Last fill date of medication was 07-09-2019 with 0 refill.  Noted that patient had recent oral swab with consistent results.

## 2019-08-10 ENCOUNTER — Other Ambulatory Visit: Payer: Self-pay | Admitting: Internal Medicine

## 2019-08-11 ENCOUNTER — Ambulatory Visit: Payer: Medicare Other | Admitting: Registered Nurse

## 2019-08-13 ENCOUNTER — Telehealth: Payer: Self-pay

## 2019-08-13 ENCOUNTER — Ambulatory Visit: Payer: Self-pay

## 2019-08-13 ENCOUNTER — Other Ambulatory Visit: Payer: Self-pay | Admitting: Nurse Practitioner

## 2019-08-13 ENCOUNTER — Other Ambulatory Visit: Payer: Self-pay

## 2019-08-13 DIAGNOSIS — I10 Essential (primary) hypertension: Secondary | ICD-10-CM

## 2019-08-13 DIAGNOSIS — N1831 Chronic kidney disease, stage 3a: Secondary | ICD-10-CM

## 2019-08-13 DIAGNOSIS — R32 Unspecified urinary incontinence: Secondary | ICD-10-CM

## 2019-08-13 DIAGNOSIS — I5022 Chronic systolic (congestive) heart failure: Secondary | ICD-10-CM

## 2019-08-14 NOTE — Chronic Care Management (AMB) (Signed)
  Chronic Care Management   Outreach Note  08/14/2019 Name: SWEDEN LESURE MRN: [id number] DOB: 1940/12/07  Referred by: [name], FNP Reason for referral : Chronic Care Management (RQ Initial RN Call )   An unsuccessful telephone outreach was attempted today. The patient was referred to the case management team for assistance with care management and care coordination.   Follow Up Plan: Telephone follow up appointment with care management team member scheduled for: 08/29/19  Barb Merino, RN, BSN, CCM Care Management Coordinator [facility]  Direct Phone: [phone]

## 2019-08-19 ENCOUNTER — Ambulatory Visit: Payer: Self-pay

## 2019-08-19 DIAGNOSIS — R413 Other amnesia: Secondary | ICD-10-CM

## 2019-08-19 DIAGNOSIS — N1831 Chronic kidney disease, stage 3a: Secondary | ICD-10-CM

## 2019-08-20 ENCOUNTER — Ambulatory Visit: Payer: Medicare Other

## 2019-08-20 ENCOUNTER — Ambulatory Visit: Payer: Medicare Other | Admitting: Nurse Practitioner

## 2019-08-20 ENCOUNTER — Telehealth: Payer: Self-pay

## 2019-08-20 NOTE — Chronic Care Management (AMB) (Signed)
Chronic Care Management    Social Work Follow Up Note  08/19/2019 Name: Sara Fox MRN: [id number] DOB: 1941-02-24  Sara Fox is a 79 y.o. year old female who is a primary care patient of [name], [place]. The CCM team was consulted for assistance with care coordination.   Review of patient status, including review of consultants reports, other relevant assessments, and collaboration with appropriate care team members and the patient's provider was performed as part of comprehensive patient evaluation and provision of chronic care management services.    SDOH (Social Determinants of Health) assessments performed: No    Outpatient Encounter Medications as of 08/19/2019  Medication Sig  . aspirin EC 81 MG tablet Take 81 mg by mouth daily.  . budesonide-formoterol (SYMBICORT) 160-4.5 MCG/ACT inhaler Inhale 2 puffs into the lungs 2 (two) times daily.  Marland Kitchen CALCIUM CITRATE PO Take 1 tablet by mouth daily.   . Cholecalciferol (VITAMIN D3) 2000 UNITS capsule Take 2,000 Units by mouth daily.  . ciclopirox (PENLAC) 8 % solution Apply topically at bedtime. Apply over nail and surrounding skin. Apply daily over previous coat. After seven (7) days, file nail and continue cycle.  . donepezil (ARICEPT) 5 MG tablet Take 1 tablet (5 mg total) by mouth at bedtime.  . furosemide (LASIX) 20 MG tablet TAKE 1 TABLET BY MOUTH EVERY OTHER DAY  . gabapentin (NEURONTIN) 100 MG capsule TAKE 1 CAPSULE BY MOUTH THREE TIMES DAILY  . Magnesium 250 MG TABS Take 1 tablet by mouth with evening meal daily  . metoprolol tartrate (LOPRESSOR) 25 MG tablet TAKE 1 TABLET BY MOUTH TWICE DAILY WITH A MEAL  . Multiple Vitamin (MULTIVITAMIN) capsule Take 1 capsule by mouth daily. Contains iron  . nitroGLYCERIN (NITROSTAT) 0.4 MG SL tablet PLACE ONE TABLET UNDER THE TONGUE EVERY 5 MINUTES AS NEEDED FOR CHEST PAIN.  Marland Kitchen oxybutynin (DITROPAN) 5 MG tablet Take 1/2 (one-half) tablet by mouth twice daily  . pantoprazole (PROTONIX) 40  MG tablet Take 1 tablet by mouth twice daily  . polyethylene glycol (MIRALAX / GLYCOLAX) packet Take 17 g by mouth daily as needed for mild constipation.   . Potassium Chloride ER 20 MEQ TBCR TAKE 2 TABLETS BY MOUTH IN THE MORNING  . rosuvastatin (CRESTOR) 40 MG tablet Take 1 tablet (40 mg total) by mouth daily.  . traMADol (ULTRAM) 50 MG tablet TAKE 1-2 TABLETS THREE TIMES DAILY.  . vitamin C (ASCORBIC ACID) 500 MG tablet Take 500 mg by mouth daily.   No facility-administered encounter medications on file as of 08/19/2019.     Goals Addressed            This Visit's Progress   . COMPLETED: Collaborate with RN Case Manager to assist with care coordination needs       CARE PLAN ENTRY (see longtitudinal plan of care for additional care plan information)  Current Barriers:  . Social Isolation . Memory Deficits . HTN, CHF, CKD III, mild cognitive impairment  Social Work Clinical Goal(s):  Marland Kitchen Over the next 90 days, patient will work with care management team to address needs related to care coordination needs and caregiver asistance.  CCM SW Interventions: Completed 08/19/2019 with patients son [name] . Outbound call placed to the patients son and caregiver [name] to confirm receipt of mailed resources . Provided education on caregiver resources provided . Advised [name] to contact SW with future resource needs  Patient Self Care Activities:  . Attends all scheduled provider appointments .  Calls provider office for new concerns or questions . Supportive family to assist with patient care needs  Please see past updates related to this goal by clicking on the "Past Updates" button in the selected goal          Follow Up Plan: No SW follow up planned at this time. The patient will remain active with RN Care Manager to address chronic care management needs.  [name], BSW, CDP Social Worker, Certified Dementia Practitioner [employer] [facility] [place]  [facility] [phone]  Total time spent performing care coordination and/or care management activities with the patient by phone or face to face = 7 minutes.

## 2019-08-20 NOTE — Patient Instructions (Signed)
Social Worker Visit Information  Goals we discussed today:  Goals Addressed            This Visit's Progress   . COMPLETED: Collaborate with RN Case Manager to assist with care coordination needs       CARE PLAN ENTRY (see longtitudinal plan of care for additional care plan information)  Current Barriers:  . Social Isolation . Memory Deficits . HTN, CHF, CKD III, mild cognitive impairment  Social Work Clinical Goal(s):  Marland Kitchen Over the next 90 days, patient will work with care management team to address needs related to care coordination needs and caregiver asistance.  CCM SW Interventions: Completed 08/19/2019 with patients son [name] . Outbound call placed to the patients son and caregiver [name] to confirm receipt of mailed resources . Provided education on caregiver resources provided . Advised [name] to contact SW with future resource needs  Patient Self Care Activities:  . Attends all scheduled provider appointments . Calls provider office for new concerns or questions . Supportive family to assist with patient care needs  Please see past updates related to this goal by clicking on the "Past Updates" button in the selected goal          Materials Provided: Verbal education about caregiver resources provided by phone  Follow Up Plan: No SW follow up planned at this time. Please contact me with future resource needs.   [name], BSW, CDP Social Worker, Certified Dementia Practitioner [place] [facility] [place] [facility] [phone]

## 2019-08-20 NOTE — Telephone Encounter (Signed)
This nurse called patient due to her missing our scheduled appointment. Patient stated that she was not feeling very well this morning and that is why she did not come in. Rescheduled for 12/11/2019 at 11:00.

## 2019-08-25 ENCOUNTER — Ambulatory Visit: Payer: Self-pay

## 2019-08-25 NOTE — Chronic Care Management (AMB) (Signed)
  Chronic Care Management    Social Work Follow Up Note  08/25/2019 Name: Sara Fox MRN: [id number] DOB: 1940/12/08  Sara Fox is a 79 y.o. year old female who is a primary care patient of [name], [place]. The CCM team was consulted for assistance with care coordination.   Review of patient status, including review of consultants reports, other relevant assessments, and collaboration with appropriate care team members and the patient's provider was performed as part of comprehensive patient evaluation and provision of chronic care management services.    SW placed a successful outbound call to the patient in response to voice messages received requesting contact. SW reviewed the care management program with the patient and confirmed patient interest in enrollment. Unfortunately, today's call was dropped. SW attempted to contact the patient and left a voice message requesting a return call.     Outpatient Encounter Medications as of 08/25/2019  Medication Sig  . aspirin EC 81 MG tablet Take 81 mg by mouth daily.  . budesonide-formoterol (SYMBICORT) 160-4.5 MCG/ACT inhaler Inhale 2 puffs into the lungs 2 (two) times daily.  Marland Kitchen CALCIUM CITRATE PO Take 1 tablet by mouth daily.   . Cholecalciferol (VITAMIN D3) 2000 UNITS capsule Take 2,000 Units by mouth daily.  . ciclopirox (PENLAC) 8 % solution Apply topically at bedtime. Apply over nail and surrounding skin. Apply daily over previous coat. After seven (7) days, file nail and continue cycle.  . donepezil (ARICEPT) 5 MG tablet Take 1 tablet (5 mg total) by mouth at bedtime.  . furosemide (LASIX) 20 MG tablet TAKE 1 TABLET BY MOUTH EVERY OTHER DAY  . gabapentin (NEURONTIN) 100 MG capsule TAKE 1 CAPSULE BY MOUTH THREE TIMES DAILY  . Magnesium 250 MG TABS Take 1 tablet by mouth with evening meal daily  . metoprolol tartrate (LOPRESSOR) 25 MG tablet TAKE 1 TABLET BY MOUTH TWICE DAILY WITH A MEAL  . Multiple Vitamin (MULTIVITAMIN) capsule  Take 1 capsule by mouth daily. Contains iron  . nitroGLYCERIN (NITROSTAT) 0.4 MG SL tablet PLACE ONE TABLET UNDER THE TONGUE EVERY 5 MINUTES AS NEEDED FOR CHEST PAIN.  Marland Kitchen oxybutynin (DITROPAN) 5 MG tablet Take 1/2 (one-half) tablet by mouth twice daily  . pantoprazole (PROTONIX) 40 MG tablet Take 1 tablet by mouth twice daily  . polyethylene glycol (MIRALAX / GLYCOLAX) packet Take 17 g by mouth daily as needed for mild constipation.   . Potassium Chloride ER 20 MEQ TBCR TAKE 2 TABLETS BY MOUTH IN THE MORNING  . rosuvastatin (CRESTOR) 40 MG tablet Take 1 tablet (40 mg total) by mouth daily.  . traMADol (ULTRAM) 50 MG tablet TAKE 1-2 TABLETS THREE TIMES DAILY.  . vitamin C (ASCORBIC ACID) 500 MG tablet Take 500 mg by mouth daily.   No facility-administered encounter medications on file as of 08/25/2019.    Follow Up Plan: SW will follow up with patient by phone over the next week.   [name], BSW, CDP Social Worker, Certified Dementia Practitioner [place] [facility] [place] [facility] [phone]

## 2019-08-26 ENCOUNTER — Ambulatory Visit: Payer: Medicare Other | Admitting: Sports Medicine

## 2019-08-26 ENCOUNTER — Ambulatory Visit (INDEPENDENT_AMBULATORY_CARE_PROVIDER_SITE_OTHER): Payer: Medicare Other

## 2019-08-26 DIAGNOSIS — N1831 Chronic kidney disease, stage 3a: Secondary | ICD-10-CM

## 2019-08-26 DIAGNOSIS — I10 Essential (primary) hypertension: Secondary | ICD-10-CM

## 2019-08-26 DIAGNOSIS — R413 Other amnesia: Secondary | ICD-10-CM

## 2019-08-26 DIAGNOSIS — I5022 Chronic systolic (congestive) heart failure: Secondary | ICD-10-CM | POA: Diagnosis not present

## 2019-08-26 NOTE — Chronic Care Management (AMB) (Signed)
Chronic Care Management    Social Work Follow Up Note  08/26/2019 Name: Sara Fox MRN: [id number] DOB: 10-12-1940  Sara Fox is a 79 y.o. year old female who is a primary care patient of [name], [place]. The CCM team was consulted for assistance with care coordination.   Review of patient status, including review of consultants reports, other relevant assessments, and collaboration with appropriate care team members and the patient's provider was performed as part of comprehensive patient evaluation and provision of chronic care management services.    SDOH (Social Determinants of Health) assessments performed: No    Outpatient Encounter Medications as of 08/26/2019  Medication Sig  . aspirin EC 81 MG tablet Take 81 mg by mouth daily.  . budesonide-formoterol (SYMBICORT) 160-4.5 MCG/ACT inhaler Inhale 2 puffs into the lungs 2 (two) times daily.  Marland Kitchen CALCIUM CITRATE PO Take 1 tablet by mouth daily.   . Cholecalciferol (VITAMIN D3) 2000 UNITS capsule Take 2,000 Units by mouth daily.  . ciclopirox (PENLAC) 8 % solution Apply topically at bedtime. Apply over nail and surrounding skin. Apply daily over previous coat. After seven (7) days, file nail and continue cycle.  . donepezil (ARICEPT) 5 MG tablet Take 1 tablet (5 mg total) by mouth at bedtime.  . furosemide (LASIX) 20 MG tablet TAKE 1 TABLET BY MOUTH EVERY OTHER DAY  . gabapentin (NEURONTIN) 100 MG capsule TAKE 1 CAPSULE BY MOUTH THREE TIMES DAILY  . Magnesium 250 MG TABS Take 1 tablet by mouth with evening meal daily  . metoprolol tartrate (LOPRESSOR) 25 MG tablet TAKE 1 TABLET BY MOUTH TWICE DAILY WITH A MEAL  . Multiple Vitamin (MULTIVITAMIN) capsule Take 1 capsule by mouth daily. Contains iron  . nitroGLYCERIN (NITROSTAT) 0.4 MG SL tablet PLACE ONE TABLET UNDER THE TONGUE EVERY 5 MINUTES AS NEEDED FOR CHEST PAIN.  Marland Kitchen oxybutynin (DITROPAN) 5 MG tablet Take 1/2 (one-half) tablet by mouth twice daily  . pantoprazole (PROTONIX)  40 MG tablet Take 1 tablet by mouth twice daily  . polyethylene glycol (MIRALAX / GLYCOLAX) packet Take 17 g by mouth daily as needed for mild constipation.   . Potassium Chloride ER 20 MEQ TBCR TAKE 2 TABLETS BY MOUTH IN THE MORNING  . rosuvastatin (CRESTOR) 40 MG tablet Take 1 tablet (40 mg total) by mouth daily.  . traMADol (ULTRAM) 50 MG tablet TAKE 1-2 TABLETS THREE TIMES DAILY.  . vitamin C (ASCORBIC ACID) 500 MG tablet Take 500 mg by mouth daily.   No facility-administered encounter medications on file as of 08/26/2019.     Goals Addressed            This Visit's Progress     Patient Stated   . "I need help getting a COVID 19 vaccine" (pt-stated)       CARE PLAN ENTRY (see longitudinal plan of care for additional care plan information)  Current Barriers:  . Limited access to transportation due to caregiver work schedule . Limited knowledge on how to access vaccination sign up . Cognitive decline resulting in difficulty with managing health care needs independently  Social Work Clinical Goal(s):  Marland Kitchen Over the next 5 days the patient will attend her COVID 19 vaccine appointment obtained by SW on behalf of the patient  CCM SW Interventions: Completed 08/26/2019 . Successful outbound call placed to the patient to assist with care coordination needs . Informed by the patient she is interested in receiving COVID 19 vaccine but is unsure how to obtain  an appointment . Discussed vaccine clinic opportunities within [place] . Assessed for patient questions regarding vaccine effectiveness and/or side effects o The patient reports no concerns or questions . Assisted the patient in scheduling a vaccine appointment for Friday April 16th 12:05 pm at [place] patient reports her son is off work this day and will assist with transportation . Advised the patient SW would contact her the morning of her appointment as a reminder call  Patient Self Care Activities:   . Patient verbalizes understanding of plan to obtain COVID 19 vaccine on April 16th . Calls pharmacy for medication refills . Calls provider office for new concerns or questions . Supportive family to assist with care coordination needs  Initial goal documentation      Other   . Assist with Chronic Care Management and Care Coordination needs   On track    CARE PLAN ENTRY (see longtitudinal plan of care for additional care plan information)   Current Barriers:  . Chronic Disease Management support, education, and care coordination needs related to CHF, HTN, and CKD Stage III  Case Manager Clinical Goal(s):  Marland Kitchen Over the next 90 days, patient will work with the CCM team to address needs related to Chronic Care Management and Care Coordination needs in patient with CHF, HTN, and CKD Stage III  Interventions:  . Collaborated with BSW to initiate plan of care to address needs related to Lacks knowledge of community resource:    in patient with CHF, HTN, and CKD Stage III  Patient Self Care Activities:  . Attends all scheduled provider appointments . Calls provider office for new concerns or questions . Supportive family to assist with patient care needs  Initial goal documentation     . Collaborate with RN Case Manager to assist with care coordination needs       CARE PLAN ENTRY (see longtitudinal plan of care for additional care plan information)  Current Barriers:  . Social Isolation . Memory Deficits . HTN, CHF, CKD III, mild cognitive impairment  Social Work Clinical Goal(s):  Marland Kitchen Over the next 90 days, patient will work with care management team to address needs related to care coordination needs and caregiver asistance.  CCM SW Interventions: Completed 08/26/2019 . Outbound call placed to the patient in response to voice message requesting a return call . Review the patients desire to obtain assistance with ADL/iADL's as well as interest in adult day programs to assist  with transportation and caregiver needs when her son is unavailable . Informed by the patient she has both Medicare and Medicaid . Educated the patient on adult day programs offered by [employer], [facility], as well as opportunity to apply for caregiver benefit under Medicaid [facility] . Determined the patient is most interested in [facility] due to proximity to her home, ability to access transportation, and interest in day center . Mailed information to the patients home about [facility]  . Scheduled follow up appointment over the next month to assist with care coordination needs  Patient Self Care Activities:  . Attends all scheduled provider appointments . Calls provider office for new concerns or questions . Supportive family to assist with patient care needs  Please see past updates related to this goal by clicking on the "Past Updates" button in the selected goal          Follow Up Plan: Appointment scheduled for SW follow up with client by  phone on: April 16th.  [name], BSW, CDP Social Worker, Certified Dementia Practitioner [place] [facility] [place] [facility] [phone]  Total time spent performing care coordination and/or care management activities with the patient by phone or face to face = 24 minutes.

## 2019-08-26 NOTE — Patient Instructions (Signed)
Social Worker Visit Information  Goals we discussed today:  Goals Addressed            This Visit's Progress     Patient Stated   . "I need help getting a COVID 19 vaccine" (pt-stated)       CARE PLAN ENTRY (see longitudinal plan of care for additional care plan information)  Current Barriers:  . Limited access to transportation due to caregiver work schedule . Limited knowledge on how to access vaccination sign up . Cognitive decline resulting in difficulty with managing health care needs independently  Social Work Clinical Goal(s):  Marland Kitchen Over the next 5 days the patient will attend her COVID 19 vaccine appointment obtained by SW on behalf of the patient  CCM SW Interventions: Completed 08/26/2019 . Successful outbound call placed to the patient to assist with care coordination needs . Informed by the patient she is interested in receiving COVID 19 vaccine but is unsure how to obtain an appointment . Discussed vaccine clinic opportunities within [place] . Assessed for patient questions regarding vaccine effectiveness and/or side effects o The patient reports no concerns or questions . Assisted the patient in scheduling a vaccine appointment for Friday April 16th 12:05 pm at [facility] patient reports her son is off work this day and will assist with transportation . Advised the patient SW would contact her the morning of her appointment as a reminder call  Patient Self Care Activities:  . Patient verbalizes understanding of plan to obtain COVID 19 vaccine on April 16th . Calls pharmacy for medication refills . Calls provider office for new concerns or questions . Supportive family to assist with care coordination needs  Initial goal documentation      Other   . Assist with Chronic Care Management and Care Coordination needs   On track    CARE PLAN ENTRY (see longtitudinal plan of care for additional care plan information)   Current Barriers:   . Chronic Disease Management support, education, and care coordination needs related to CHF, HTN, and CKD Stage III  Case Manager Clinical Goal(s):  Marland Kitchen Over the next 90 days, patient will work with the CCM team to address needs related to Chronic Care Management and Care Coordination needs in patient with CHF, HTN, and CKD Stage III  Interventions:  . Collaborated with BSW to initiate plan of care to address needs related to Lacks knowledge of community resource:    in patient with CHF, HTN, and CKD Stage III  Patient Self Care Activities:  . Attends all scheduled provider appointments . Calls provider office for new concerns or questions . Supportive family to assist with patient care needs  Initial goal documentation     . Collaborate with RN Case Manager to assist with care coordination needs       CARE PLAN ENTRY (see longtitudinal plan of care for additional care plan information)  Current Barriers:  . Social Isolation . Memory Deficits . HTN, CHF, CKD III, mild cognitive impairment  Social Work Clinical Goal(s):  Marland Kitchen Over the next 90 days, patient will work with care management team to address needs related to care coordination needs and caregiver asistance.  CCM SW Interventions: Completed 08/26/2019 . Outbound call placed to the patient in response to voice message requesting a return call . Review the patients desire to obtain assistance with ADL/iADL's as well as interest in adult day programs to assist with transportation and caregiver needs when her son is  unavailable . Informed by the patient she has both Medicare and Medicaid . Educated the patient on adult day programs offered by [employer], [facility], as well as opportunity to apply for caregiver benefit under Medicaid [facility] . Determined the patient is most interested in [facility] due to proximity to her home, ability to access transportation, and interest in day center . Mailed information to  the patients home about [facility]  . Scheduled follow up appointment over the next month to assist with care coordination needs  Patient Self Care Activities:  . Attends all scheduled provider appointments . Calls provider office for new concerns or questions . Supportive family to assist with patient care needs  Please see past updates related to this goal by clicking on the "Past Updates" button in the selected goal          Follow Up Plan: Appointment scheduled for SW follow up with client by phone on: April 16th.   [name], BSW, CDP Social Worker, Certified Dementia Practitioner [place] [facility] [phone]

## 2019-08-27 ENCOUNTER — Ambulatory Visit: Payer: Self-pay

## 2019-08-27 DIAGNOSIS — R413 Other amnesia: Secondary | ICD-10-CM

## 2019-08-27 DIAGNOSIS — N1831 Chronic kidney disease, stage 3a: Secondary | ICD-10-CM

## 2019-08-27 NOTE — Chronic Care Management (AMB) (Signed)
  Chronic Care Management   Social Work Note  08/27/2019 Name: [name] MRN: [id number] DOB: 01/08/1941  Sara Fox is a 79 y.o. year old female who sees [name], [place] for primary care. The CCM team was consulted for assistance with care coordination.   SW placed a successful outbound call placed to the patient in response to a voice message received requesting a return call. SW reviewed COVID 19 vaccine appointment date and time with the patient at her request. SW advised the patient SW would contact the patient on the day of her appointment to offer a reminder as well.     Outpatient Encounter Medications as of 08/27/2019  Medication Sig  . aspirin EC 81 MG tablet Take 81 mg by mouth daily.  . budesonide-formoterol (SYMBICORT) 160-4.5 MCG/ACT inhaler Inhale 2 puffs into the lungs 2 (two) times daily.  Marland Kitchen CALCIUM CITRATE PO Take 1 tablet by mouth daily.   . Cholecalciferol (VITAMIN D3) 2000 UNITS capsule Take 2,000 Units by mouth daily.  . ciclopirox (PENLAC) 8 % solution Apply topically at bedtime. Apply over nail and surrounding skin. Apply daily over previous coat. After seven (7) days, file nail and continue cycle.  . donepezil (ARICEPT) 5 MG tablet Take 1 tablet (5 mg total) by mouth at bedtime.  . furosemide (LASIX) 20 MG tablet TAKE 1 TABLET BY MOUTH EVERY OTHER DAY  . gabapentin (NEURONTIN) 100 MG capsule TAKE 1 CAPSULE BY MOUTH THREE TIMES DAILY  . Magnesium 250 MG TABS Take 1 tablet by mouth with evening meal daily  . metoprolol tartrate (LOPRESSOR) 25 MG tablet TAKE 1 TABLET BY MOUTH TWICE DAILY WITH A MEAL  . Multiple Vitamin (MULTIVITAMIN) capsule Take 1 capsule by mouth daily. Contains iron  . nitroGLYCERIN (NITROSTAT) 0.4 MG SL tablet PLACE ONE TABLET UNDER THE TONGUE EVERY 5 MINUTES AS NEEDED FOR CHEST PAIN.  Marland Kitchen oxybutynin (DITROPAN) 5 MG tablet Take 1/2 (one-half) tablet by mouth twice daily  . pantoprazole (PROTONIX) 40 MG tablet Take 1 tablet by mouth twice  daily  . polyethylene glycol (MIRALAX / GLYCOLAX) packet Take 17 g by mouth daily as needed for mild constipation.   . Potassium Chloride ER 20 MEQ TBCR TAKE 2 TABLETS BY MOUTH IN THE MORNING  . rosuvastatin (CRESTOR) 40 MG tablet Take 1 tablet (40 mg total) by mouth daily.  . traMADol (ULTRAM) 50 MG tablet TAKE 1-2 TABLETS THREE TIMES DAILY.  . vitamin C (ASCORBIC ACID) 500 MG tablet Take 500 mg by mouth daily.   No facility-administered encounter medications on file as of 08/27/2019.    Follow Up Plan: SW will contact the patient on Friday April 16th.  [name], BSW, CDP Social Worker, Certified Dementia Practitioner [place] [facility] [place] [facility] [phone]

## 2019-08-28 ENCOUNTER — Telehealth: Payer: Self-pay

## 2019-08-29 ENCOUNTER — Telehealth: Payer: Self-pay

## 2019-08-29 ENCOUNTER — Ambulatory Visit: Payer: Self-pay

## 2019-08-29 DIAGNOSIS — N1831 Chronic kidney disease, stage 3a: Secondary | ICD-10-CM | POA: Diagnosis not present

## 2019-08-29 DIAGNOSIS — R413 Other amnesia: Secondary | ICD-10-CM

## 2019-08-29 DIAGNOSIS — I10 Essential (primary) hypertension: Secondary | ICD-10-CM | POA: Diagnosis not present

## 2019-08-29 DIAGNOSIS — I5022 Chronic systolic (congestive) heart failure: Secondary | ICD-10-CM | POA: Diagnosis not present

## 2019-08-29 NOTE — Patient Instructions (Signed)
Social Worker Visit Information  Goals we discussed today:  Goals Addressed            This Visit's Progress     Patient Stated   . "I need help getting a COVID 19 vaccine" (pt-stated)       CARE PLAN ENTRY (see longitudinal plan of care for additional care plan information)  Current Barriers:  . Limited access to transportation due to caregiver work schedule . Limited knowledge on how to access vaccination sign up . Cognitive decline resulting in difficulty with managing health care needs independently  Social Work Clinical Goal(s):  Marland Kitchen Over the next 5 days the patient will attend her COVID 19 vaccine appointment obtained by SW on behalf of the patient  CCM SW Interventions: Completed 08/29/2019 . Successful outbound call placed to the patient to remind of COVID 19 vaccine appointment . Determined the patient was unable to attend today's appointment due to lack of transportation o "I thought my son could drive me but he has to work" . Discussed barriers in locating alternative transportation . Voice message left for health department to cancel patient appointment . Advised the patient there are walk in appointments available at the [facility] if her son is able to assist after work or on Saturday 4/17 . Scheduled follow up appointment to the patient over the next week to assist with scheduling if needed  Patient Self Care Activities:  . Patient verbalizes understanding of plan to obtain COVID 19 vaccine on April 16th . Calls pharmacy for medication refills . Calls provider office for new concerns or questions . Supportive family to assist with care coordination needs  Please see past updates related to this goal by clicking on the "Past Updates" button in the selected goal        Follow Up Plan: SW will follow up with patient by phone over the next week   [name], BSW, CDP Social Worker, Certified Dementia Practitioner [place] [facility] [place]  [facility] [phone]

## 2019-08-29 NOTE — Chronic Care Management (AMB) (Signed)
Chronic Care Management    Social Work Follow Up Note  08/29/2019 Name: Sara Fox MRN: [id number] DOB: 1940-09-24  Sara Fox is a 79 y.o. year old female who is a primary care patient of [name], [place]. The CCM team was consulted for assistance with care coordination.   Review of patient status, including review of consultants reports, other relevant assessments, and collaboration with appropriate care team members and the patient's provider was performed as part of comprehensive patient evaluation and provision of chronic care management services.    SDOH (Social Determinants of Health) assessments performed: No    Outpatient Encounter Medications as of 08/29/2019  Medication Sig  . aspirin EC 81 MG tablet Take 81 mg by mouth daily.  . budesonide-formoterol (SYMBICORT) 160-4.5 MCG/ACT inhaler Inhale 2 puffs into the lungs 2 (two) times daily.  Marland Kitchen CALCIUM CITRATE PO Take 1 tablet by mouth daily.   . Cholecalciferol (VITAMIN D3) 2000 UNITS capsule Take 2,000 Units by mouth daily.  . ciclopirox (PENLAC) 8 % solution Apply topically at bedtime. Apply over nail and surrounding skin. Apply daily over previous coat. After seven (7) days, file nail and continue cycle.  . donepezil (ARICEPT) 5 MG tablet Take 1 tablet (5 mg total) by mouth at bedtime.  . furosemide (LASIX) 20 MG tablet TAKE 1 TABLET BY MOUTH EVERY OTHER DAY  . gabapentin (NEURONTIN) 100 MG capsule TAKE 1 CAPSULE BY MOUTH THREE TIMES DAILY  . Magnesium 250 MG TABS Take 1 tablet by mouth with evening meal daily  . metoprolol tartrate (LOPRESSOR) 25 MG tablet TAKE 1 TABLET BY MOUTH TWICE DAILY WITH A MEAL  . Multiple Vitamin (MULTIVITAMIN) capsule Take 1 capsule by mouth daily. Contains iron  . nitroGLYCERIN (NITROSTAT) 0.4 MG SL tablet PLACE ONE TABLET UNDER THE TONGUE EVERY 5 MINUTES AS NEEDED FOR CHEST PAIN.  Marland Kitchen oxybutynin (DITROPAN) 5 MG tablet Take 1/2 (one-half) tablet by mouth twice daily  . pantoprazole (PROTONIX)  40 MG tablet Take 1 tablet by mouth twice daily  . polyethylene glycol (MIRALAX / GLYCOLAX) packet Take 17 g by mouth daily as needed for mild constipation.   . Potassium Chloride ER 20 MEQ TBCR TAKE 2 TABLETS BY MOUTH IN THE MORNING  . rosuvastatin (CRESTOR) 40 MG tablet Take 1 tablet (40 mg total) by mouth daily.  . traMADol (ULTRAM) 50 MG tablet TAKE 1-2 TABLETS THREE TIMES DAILY.  . vitamin C (ASCORBIC ACID) 500 MG tablet Take 500 mg by mouth daily.   No facility-administered encounter medications on file as of 08/29/2019.     Goals Addressed            This Visit's Progress     Patient Stated   . "I need help getting a COVID 19 vaccine" (pt-stated)       CARE PLAN ENTRY (see longitudinal plan of care for additional care plan information)  Current Barriers:  . Limited access to transportation due to caregiver work schedule . Limited knowledge on how to access vaccination sign up . Cognitive decline resulting in difficulty with managing health care needs independently  Social Work Clinical Goal(s):  Marland Kitchen Over the next 5 days the patient will attend her COVID 19 vaccine appointment obtained by SW on behalf of the patient  CCM SW Interventions: Completed 08/29/2019 . Successful outbound call placed to the patient to remind of COVID 19 vaccine appointment . Determined the patient was unable to attend today's appointment due to lack of transportation o "I thought  my son could drive me but he has to work" . Discussed barriers in locating alternative transportation . Voice message left for health department to cancel patient appointment . Advised the patient there are walk in appointments available at the [facility] if her son is able to assist after work or on Saturday 4/17 . Scheduled follow up appointment to the patient over the next week to assist with scheduling if needed  Patient Self Care Activities:  . Patient verbalizes understanding of plan to obtain COVID 19  vaccine on April 16th . Calls pharmacy for medication refills . Calls provider office for new concerns or questions . Supportive family to assist with care coordination needs  Please see past updates related to this goal by clicking on the "Past Updates" button in the selected goal         Follow Up Plan: SW will follow up with patient by phone over the next week   [name], BSW, CDP Social Worker, Certified Dementia Practitioner [place] [facility] [place] [facility] [phone]  Total time spent performing care coordination and/or care management activities with the patient by phone or face to face = 12 minutes.

## 2019-08-30 ENCOUNTER — Ambulatory Visit: Payer: Medicare Other | Attending: Internal Medicine

## 2019-08-30 DIAGNOSIS — Z23 Encounter for immunization: Secondary | ICD-10-CM

## 2019-08-30 NOTE — Progress Notes (Signed)
   Covid-19 Vaccination Clinic  Name:  SHIA DELAINE    MRN: [id number] DOB: 05-22-1940  08/30/2019  Ms. Jumper was observed post Covid-19 immunization for 30 minutes based on pre-vaccination screening without incident. She was provided with Vaccine Information Sheet and instruction to access the V-Safe system.   Ms. Mayon was instructed to call 911 with any severe reactions post vaccine: Marland Kitchen Difficulty breathing  . Swelling of face and throat  . A fast heartbeat  . A bad rash all over body  . Dizziness and weakness   Immunizations Administered    Name Date Dose VIS Date Route   Pfizer COVID-19 Vaccine 08/30/2019  2:51 PM 0.3 mL 04/25/2019 Intramuscular   Manufacturer: [place]   Lot: [id number]   [place]: [id number]

## 2019-09-01 ENCOUNTER — Telehealth: Payer: Self-pay

## 2019-09-01 ENCOUNTER — Ambulatory Visit: Payer: Self-pay

## 2019-09-01 DIAGNOSIS — I10 Essential (primary) hypertension: Secondary | ICD-10-CM

## 2019-09-01 DIAGNOSIS — R413 Other amnesia: Secondary | ICD-10-CM

## 2019-09-01 NOTE — Patient Instructions (Signed)
Social Worker Visit Information  Goals we discussed today:  Goals Addressed            This Visit's Progress     Patient Stated   . COMPLETED: "I need help getting a COVID 19 vaccine" (pt-stated)       CARE PLAN ENTRY (see longitudinal plan of care for additional care plan information)  Current Barriers:  . Limited access to transportation due to caregiver work schedule . Limited knowledge on how to access vaccination sign up . Cognitive decline resulting in difficulty with managing health care needs independently  Social Work Clinical Goal(s):  Marland Kitchen Over the next 5 days the patient will attend her COVID 19 vaccine appointment obtained by SW on behalf of the patient  CCM SW Interventions: Completed 09/01/2019 . Performed chart review to note first [place] vaccination received Saturday 4/17 at [place] . Second dose scheduled for May 12th . Goal Met  Patient Self Care Activities:  . Patient verbalizes understanding of plan to obtain COVID 19 vaccine on April 16th . Calls pharmacy for medication refills . Calls provider office for new concerns or questions . Supportive family to assist with care coordination needs  Please see past updates related to this goal by clicking on the "Past Updates" button in the selected goal        Follow Up Plan: SW will follow up with patient by phone over the next month   [name], BSW, CDP Social Worker, Certified Dementia Practitioner [place] [facility] [place] [facility] [phone]

## 2019-09-01 NOTE — Chronic Care Management (AMB) (Signed)
Chronic Care Management    Social Work Follow Up Note  09/01/2019 Name: ALEYZA SALMI MRN: [id number] DOB: 1941-04-29  KIMMIE BERGGREN is a 79 y.o. year old female who is a primary care patient of [name], [place]. The CCM team was consulted for assistance with care coordination.   Review of patient status, including review of consultants reports, other relevant assessments, and collaboration with appropriate care team members and the patient's provider was performed as part of comprehensive patient evaluation and provision of chronic care management services.    SDOH (Social Determinants of Health) assessments performed: No    Outpatient Encounter Medications as of 09/01/2019  Medication Sig  . aspirin EC 81 MG tablet Take 81 mg by mouth daily.  . budesonide-formoterol (SYMBICORT) 160-4.5 MCG/ACT inhaler Inhale 2 puffs into the lungs 2 (two) times daily.  Marland Kitchen CALCIUM CITRATE PO Take 1 tablet by mouth daily.   . Cholecalciferol (VITAMIN D3) 2000 UNITS capsule Take 2,000 Units by mouth daily.  . ciclopirox (PENLAC) 8 % solution Apply topically at bedtime. Apply over nail and surrounding skin. Apply daily over previous coat. After seven (7) days, file nail and continue cycle.  . donepezil (ARICEPT) 5 MG tablet Take 1 tablet (5 mg total) by mouth at bedtime.  . furosemide (LASIX) 20 MG tablet TAKE 1 TABLET BY MOUTH EVERY OTHER DAY  . gabapentin (NEURONTIN) 100 MG capsule TAKE 1 CAPSULE BY MOUTH THREE TIMES DAILY  . Magnesium 250 MG TABS Take 1 tablet by mouth with evening meal daily  . metoprolol tartrate (LOPRESSOR) 25 MG tablet TAKE 1 TABLET BY MOUTH TWICE DAILY WITH A MEAL  . Multiple Vitamin (MULTIVITAMIN) capsule Take 1 capsule by mouth daily. Contains iron  . nitroGLYCERIN (NITROSTAT) 0.4 MG SL tablet PLACE ONE TABLET UNDER THE TONGUE EVERY 5 MINUTES AS NEEDED FOR CHEST PAIN.  Marland Kitchen oxybutynin (DITROPAN) 5 MG tablet Take 1/2 (one-half) tablet by mouth twice daily  . pantoprazole (PROTONIX)  40 MG tablet Take 1 tablet by mouth twice daily  . polyethylene glycol (MIRALAX / GLYCOLAX) packet Take 17 g by mouth daily as needed for mild constipation.   . Potassium Chloride ER 20 MEQ TBCR TAKE 2 TABLETS BY MOUTH IN THE MORNING  . rosuvastatin (CRESTOR) 40 MG tablet Take 1 tablet (40 mg total) by mouth daily.  . traMADol (ULTRAM) 50 MG tablet TAKE 1-2 TABLETS THREE TIMES DAILY.  . vitamin C (ASCORBIC ACID) 500 MG tablet Take 500 mg by mouth daily.   No facility-administered encounter medications on file as of 09/01/2019.     Goals Addressed            This Visit's Progress     Patient Stated   . COMPLETED: "I need help getting a COVID 19 vaccine" (pt-stated)       CARE PLAN ENTRY (see longitudinal plan of care for additional care plan information)  Current Barriers:  . Limited access to transportation due to caregiver work schedule . Limited knowledge on how to access vaccination sign up . Cognitive decline resulting in difficulty with managing health care needs independently  Social Work Clinical Goal(s):  Marland Kitchen Over the next 5 days the patient will attend her COVID 19 vaccine appointment obtained by SW on behalf of the patient  CCM SW Interventions: Completed 09/01/2019 . Performed chart review to note first [place] vaccination received Saturday 4/17 at [place] . Second dose scheduled for May 12th . Goal Met  Patient Self Care  Activities:  . Patient verbalizes understanding of plan to obtain COVID 19 vaccine on April 16th . Calls pharmacy for medication refills . Calls provider office for new concerns or questions . Supportive family to assist with care coordination needs  Please see past updates related to this goal by clicking on the "Past Updates" button in the selected goal         Follow Up Plan: SW will follow up with the patient over the next month to assist with ongoing care coordination needs.   [name], BSW, CDP Social  Worker, Certified Dementia Practitioner [place] [facility] [place] [facility] [phone]

## 2019-09-04 ENCOUNTER — Telehealth: Payer: Self-pay

## 2019-09-04 ENCOUNTER — Other Ambulatory Visit: Payer: Self-pay

## 2019-09-04 ENCOUNTER — Ambulatory Visit: Payer: Self-pay

## 2019-09-04 DIAGNOSIS — N1831 Chronic kidney disease, stage 3a: Secondary | ICD-10-CM

## 2019-09-04 DIAGNOSIS — R413 Other amnesia: Secondary | ICD-10-CM

## 2019-09-04 DIAGNOSIS — I10 Essential (primary) hypertension: Secondary | ICD-10-CM

## 2019-09-04 DIAGNOSIS — I5022 Chronic systolic (congestive) heart failure: Secondary | ICD-10-CM

## 2019-09-05 NOTE — Chronic Care Management (AMB) (Signed)
  Chronic Care Management   Outreach Note  09/05/2019 Name: Sara Fox MRN: [id number] DOB: Dec 04, 1940  Referred by: [name], FNP Reason for referral : Chronic Care Management (RQ #2 RN Initial Call-dementia)   A second unsuccessful telephone outreach was attempted today. The patient was referred to the case management team for assistance with care management and care coordination.   Follow Up Plan: A HIPPA compliant phone message was left for the patient providing contact information and requesting a return call.  Telephone follow up appointment with care management team member scheduled for: 09/19/19  Barb Merino, RN, BSN, CCM Care Management Coordinator [place] [facility]  Direct Phone: [phone]

## 2019-09-07 ENCOUNTER — Other Ambulatory Visit: Payer: Self-pay | Admitting: Nurse Practitioner

## 2019-09-07 DIAGNOSIS — R32 Unspecified urinary incontinence: Secondary | ICD-10-CM

## 2019-09-09 ENCOUNTER — Other Ambulatory Visit: Payer: Self-pay | Admitting: Nurse Practitioner

## 2019-09-09 ENCOUNTER — Ambulatory Visit: Payer: Medicare Other | Admitting: Nurse Practitioner

## 2019-09-09 DIAGNOSIS — G3184 Mild cognitive impairment, so stated: Secondary | ICD-10-CM

## 2019-09-11 ENCOUNTER — Ambulatory Visit (INDEPENDENT_AMBULATORY_CARE_PROVIDER_SITE_OTHER): Payer: Medicare Other | Admitting: Nurse Practitioner

## 2019-09-11 ENCOUNTER — Other Ambulatory Visit: Payer: Self-pay

## 2019-09-11 ENCOUNTER — Encounter: Payer: Self-pay | Admitting: Nurse Practitioner

## 2019-09-11 VITALS — BP 110/60 | HR 66 | Temp 97.7°F | Ht 64.41 in | Wt 135.4 lb

## 2019-09-11 DIAGNOSIS — G3184 Mild cognitive impairment, so stated: Secondary | ICD-10-CM

## 2019-09-11 DIAGNOSIS — I130 Hypertensive heart and chronic kidney disease with heart failure and stage 1 through stage 4 chronic kidney disease, or unspecified chronic kidney disease: Secondary | ICD-10-CM | POA: Diagnosis not present

## 2019-09-11 DIAGNOSIS — I25118 Atherosclerotic heart disease of native coronary artery with other forms of angina pectoris: Secondary | ICD-10-CM | POA: Diagnosis not present

## 2019-09-11 DIAGNOSIS — W19XXXA Unspecified fall, initial encounter: Secondary | ICD-10-CM

## 2019-09-11 DIAGNOSIS — N1831 Chronic kidney disease, stage 3a: Secondary | ICD-10-CM

## 2019-09-11 DIAGNOSIS — R32 Unspecified urinary incontinence: Secondary | ICD-10-CM

## 2019-09-11 MED ORDER — BUDESONIDE-FORMOTEROL FUMARATE 160-4.5 MCG/ACT IN AERO: 2.0000 | INHALATION_SPRAY | Freq: Two times a day (BID) | RESPIRATORY_TRACT | 1 refills | Status: DC

## 2019-09-11 MED ORDER — PANTOPRAZOLE SODIUM 40 MG PO TBEC: 40.0000 mg | DELAYED_RELEASE_TABLET | Freq: Two times a day (BID) | ORAL | 1 refills | Status: DC

## 2019-09-11 MED ORDER — OXYBUTYNIN CHLORIDE 5 MG PO TABS: 5.0000 mg | ORAL_TABLET | Freq: Two times a day (BID) | ORAL | 1 refills | Status: DC

## 2019-09-11 NOTE — Progress Notes (Signed)
Subjective:     Patient ID: Sara Fox , female    DOB: [birth year] , 79 y.o.   MRN: [id number]   Chief Complaint  Patient presents with  . Hypertension    HPI  She is doing well on the donepezil, her son feels her memory has improved.  Her son reports she is out raking the yard and doing more things around the house. She is no longer going outside wandering.   Having ups and downs with her energy.   She has had her first covid vaccine which did well next due in May.    She is drinking at least 2 sodas a day, a snapple, and 2 bottles of water a day.   Hypertension This is a chronic problem. The current episode started more than 1 year ago. The problem has been gradually improving since onset. The problem is controlled. Pertinent negatives include no blurred vision, chest pain, palpitations or shortness of breath. Risk factors for coronary artery disease include sedentary lifestyle. Past treatments include nothing.  Fall The accident occurred 5 to 7 days ago. Fall occurred: slipped on the dog's urine/feces. She landed on hard floor. The patient is experiencing no pain. Pertinent negatives include no abdominal pain. She has tried nothing for the symptoms. The treatment provided no relief.     Past Medical History:  Diagnosis Date  . Acute kidney injury ([place])   . Back pain   . Cervical cancer ([place])   . CHF (congestive heart failure) ([place])   . COPD (chronic obstructive pulmonary disease) ([place])   . Coronary artery disease    s/p DES x2 to RCA 2016  . GERD (gastroesophageal reflux disease)   . Hyperlipidemia   . Hypertension   . Myocardial infarction ([place])   . Neck pain   . Rhabdomyolysis   . Stomach problems      Family History  Problem Relation Age of Onset  . Heart disease Mother        also HTN  . Diabetes Mother   . Colon cancer Mother   . Heart disease Brother        deceased at 70  . Hypertension Brother   . Heart attack Brother   . Heart disease Brother   .  Hypertension Brother      Current Outpatient Medications:  .  aspirin EC 81 MG tablet, Take 81 mg by mouth daily., Disp: , Rfl:  .  CALCIUM CITRATE PO, Take 1 tablet by mouth daily. , Disp: , Rfl:  .  Cholecalciferol (VITAMIN D3) 2000 UNITS capsule, Take 2,000 Units by mouth daily., Disp: , Rfl:  .  donepezil (ARICEPT) 5 MG tablet, TAKE 1 TABLET BY MOUTH EVERYDAY AT BEDTIME, Disp: 90 tablet, Rfl: 1 .  furosemide (LASIX) 20 MG tablet, TAKE 1 TABLET BY MOUTH EVERY OTHER DAY, Disp: 45 tablet, Rfl: 1 .  gabapentin (NEURONTIN) 100 MG capsule, TAKE 1 CAPSULE BY MOUTH THREE TIMES DAILY, Disp: 270 capsule, Rfl: 0 .  Magnesium 250 MG TABS, Take 1 tablet by mouth with evening meal daily, Disp: 30 tablet, Rfl: 3 .  metoprolol tartrate (LOPRESSOR) 25 MG tablet, TAKE 1 TABLET BY MOUTH TWICE DAILY WITH A MEAL, Disp: 180 tablet, Rfl: 1 .  Multiple Vitamin (MULTIVITAMIN) capsule, Take 1 capsule by mouth daily. Contains iron, Disp: , Rfl:  .  nitroGLYCERIN (NITROSTAT) 0.4 MG SL tablet, PLACE ONE TABLET UNDER THE TONGUE EVERY 5 MINUTES AS NEEDED FOR CHEST PAIN., Disp: 50 tablet, Rfl:  0 .  oxybutynin (DITROPAN) 5 MG tablet, Take 1/2 (one-half) tablet by mouth twice daily, Disp: 30 tablet, Rfl: 0 .  pantoprazole (PROTONIX) 40 MG tablet, Take 1 tablet by mouth twice daily, Disp: 180 tablet, Rfl: 1 .  polyethylene glycol (MIRALAX / GLYCOLAX) packet, Take 17 g by mouth daily as needed for mild constipation. , Disp: , Rfl:  .  Potassium Chloride ER 20 MEQ TBCR, TAKE 2 TABLETS BY MOUTH IN THE MORNING, Disp: 60 tablet, Rfl: 11 .  rosuvastatin (CRESTOR) 40 MG tablet, Take 1 tablet (40 mg total) by mouth daily., Disp: 30 tablet, Rfl: 8 .  traMADol (ULTRAM) 50 MG tablet, TAKE 1-2 TABLETS THREE TIMES DAILY., Disp: 140 tablet, Rfl: 1 .  vitamin C (ASCORBIC ACID) 500 MG tablet, Take 500 mg by mouth daily., Disp: , Rfl:  .  budesonide-formoterol (SYMBICORT) 160-4.5 MCG/ACT inhaler, Inhale 2 puffs into the lungs 2 (two) times  daily., Disp: 1 Inhaler, Rfl: 1 .  ciclopirox (PENLAC) 8 % solution, Apply topically at bedtime. Apply over nail and surrounding skin. Apply daily over previous coat. After seven (7) days, file nail and continue cycle. (Patient not taking: Reported on 09/11/2019), Disp: 6.6 mL, Rfl: 0   Allergies  Allergen Reactions  . Buprenorphine Hcl Shortness Of Breath    Throat swelling/trouble breathing and lethargic  . Morphine And Related Shortness Of Breath    Throat swelling/trouble breathing and lethargic  . Celebrex [Celecoxib] Diarrhea and Swelling    Swelling of legs   . Vioxx [Rofecoxib] Palpitations  . Codeine Other (See Comments)    Bloated      Review of Systems  Constitutional: Negative.   Eyes: Negative for blurred vision.  Respiratory: Negative.  Negative for shortness of breath.   Cardiovascular: Negative.  Negative for chest pain and palpitations.  Gastrointestinal: Negative.  Negative for abdominal pain.  Neurological: Negative.   Psychiatric/Behavioral: Negative.      Today's Vitals   09/11/19 0922  BP: 110/60  Pulse: 66  Temp: 97.7 F (36.5 C)  TempSrc: Oral  Weight: 135 lb 6.4 oz (61.4 kg)  Height: 5' 4.41" (1.636 m)  PainSc: 0-No pain   Body mass index is 22.95 kg/m.   Objective:  Physical Exam Vitals and nursing note reviewed.  Constitutional:      Appearance: Normal appearance.  HENT:     Head: Normocephalic and atraumatic.  Cardiovascular:     Rate and Rhythm: Normal rate and regular rhythm.     Heart sounds: Normal heart sounds.  Pulmonary:     Effort: Pulmonary effort is normal.     Breath sounds: Normal breath sounds.     Comments: Decreased breath sounds b/l Skin:    General: Skin is warm.  Neurological:     General: No focal deficit present.     Mental Status: She is alert.  Psychiatric:        Mood and Affect: Mood normal.        Behavior: Behavior normal.         Assessment And Plan:     1. 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 Atherosclerotic heart disease of native coronary artery with other forms of angina pectoris ([place])  Chronic, continue with follow up with cardiology  3. Mild cognitive impairment with memory loss  Chronic, her son feels she is doing better with her memory  4. Urinary  incontinence, unspecified type  Chronic, stable with current medications - oxybutynin (DITROPAN) 5 MG tablet; Take 1 tablet (5 mg total) by mouth 2 (two) times daily.  Dispense: 90 tablet; Refill: 1  5. Stage 3a chronic kidney disease   6. Fall, initial encounter  She has had a fall but did not have an injury per the son, she slipped on the dogs waste   Discussed making sure the floors are dry and free of throw rugs.           [name], FNP    THE PATIENT IS ENCOURAGED TO PRACTICE SOCIAL DISTANCING DUE TO THE COVID-19 PANDEMIC.

## 2019-09-16 ENCOUNTER — Ambulatory Visit: Payer: Medicare Other | Admitting: Sports Medicine

## 2019-09-16 ENCOUNTER — Other Ambulatory Visit: Payer: Self-pay

## 2019-09-16 ENCOUNTER — Encounter: Payer: Self-pay | Admitting: Sports Medicine

## 2019-09-16 VITALS — Temp 97.1°F

## 2019-09-16 DIAGNOSIS — M79675 Pain in left toe(s): Secondary | ICD-10-CM

## 2019-09-16 DIAGNOSIS — B351 Tinea unguium: Secondary | ICD-10-CM | POA: Diagnosis not present

## 2019-09-16 DIAGNOSIS — L84 Corns and callosities: Secondary | ICD-10-CM

## 2019-09-16 DIAGNOSIS — M79674 Pain in right toe(s): Secondary | ICD-10-CM

## 2019-09-16 NOTE — Progress Notes (Signed)
Subjective: Sara Fox is a 79 y.o. female patient seen today in office with complaint of mildly painful corn and thickened and elongated toenails; unable to trim. Patient denies changes with medical history since last encounter. Patient has no other pedal complaints at this time.   Patient is assisted by daughter this visit.  Patient Active Problem List   Diagnosis Date Noted  . Fatigue 02/18/2019  . Memory loss 02/18/2019  . Vitamin B12 deficiency 10/23/2018  . Hypotension 10/23/2018  . Stage 3 chronic kidney disease 06/03/2018  . Chronic anemia 06/03/2018  . Hypercholesterolemia 06/03/2018  . Malnutrition of moderate degree 07/12/2017  . Hypoalbuminemia 07/11/2017  . Elevated LFTs 07/11/2017  . Closed fracture of left distal radius 05/28/2017  . Snoring 12/21/2016  . OSA and COPD overlap syndrome ([place]) 12/21/2016  . Peripheral musculoskeletal gait disorder 02/05/2015  . Lumbar post-laminectomy syndrome 02/05/2015  . CAD in native artery 10/16/2014  . NSTEMI (non-ST elevated myocardial infarction) ([place]) 09/13/2014  . Cardiomyopathy, ischemic 09/13/2014  . Chronic systolic heart failure ([place])   . Tobacco abuse 09/12/2014  . Dyslipidemia 09/12/2014  . CKD (chronic kidney disease), stage II 09/12/2014  . Normocytic anemia 09/12/2014  . Chronic diastolic heart failure, NYHA class 1 ([place]) 09/12/2014  . Postlaminectomy syndrome, cervical region 08/01/2013  . Chronic midline low back pain with left-sided sciatica 08/01/2013  . Shoulder joint contracture 08/01/2013  . HTN (hypertension) 03/03/2013  . Abnormal genetic test 03/03/2013    Current Outpatient Medications on File Prior to Visit  Medication Sig Dispense Refill  . aspirin EC 81 MG tablet Take 81 mg by mouth daily.    . budesonide-formoterol (SYMBICORT) 160-4.5 MCG/ACT inhaler Inhale 2 puffs into the lungs 2 (two) times daily. 3 Inhaler 1  . CALCIUM CITRATE PO Take 1 tablet by mouth daily.     . Cholecalciferol  (VITAMIN D3) 2000 UNITS capsule Take 2,000 Units by mouth daily.    . ciclopirox (PENLAC) 8 % solution Apply topically at bedtime. Apply over nail and surrounding skin. Apply daily over previous coat. After seven (7) days, file nail and continue cycle. 6.6 mL 0  . donepezil (ARICEPT) 5 MG tablet TAKE 1 TABLET BY MOUTH EVERYDAY AT BEDTIME 90 tablet 1  . furosemide (LASIX) 20 MG tablet TAKE 1 TABLET BY MOUTH EVERY OTHER DAY 45 tablet 1  . gabapentin (NEURONTIN) 100 MG capsule TAKE 1 CAPSULE BY MOUTH THREE TIMES DAILY 270 capsule 0  . Magnesium 250 MG TABS Take 1 tablet by mouth with evening meal daily 30 tablet 3  . metoprolol tartrate (LOPRESSOR) 25 MG tablet TAKE 1 TABLET BY MOUTH TWICE DAILY WITH A MEAL 180 tablet 1  . Multiple Vitamin (MULTIVITAMIN) capsule Take 1 capsule by mouth daily. Contains iron    . nitroGLYCERIN (NITROSTAT) 0.4 MG SL tablet PLACE ONE TABLET UNDER THE TONGUE EVERY 5 MINUTES AS NEEDED FOR CHEST PAIN. 50 tablet 0  . oxybutynin (DITROPAN) 5 MG tablet Take 1 tablet (5 mg total) by mouth 2 (two) times daily. 90 tablet 1  . pantoprazole (PROTONIX) 40 MG tablet Take 1 tablet (40 mg total) by mouth 2 (two) times daily. 180 tablet 1  . polyethylene glycol (MIRALAX / GLYCOLAX) packet Take 17 g by mouth daily as needed for mild constipation.     . Potassium Chloride ER 20 MEQ TBCR TAKE 2 TABLETS BY MOUTH IN THE MORNING 60 tablet 11  . rosuvastatin (CRESTOR) 40 MG tablet Take 1 tablet (40 mg total) by mouth  daily. 30 tablet 8  . traMADol (ULTRAM) 50 MG tablet TAKE 1-2 TABLETS THREE TIMES DAILY. 140 tablet 1  . vitamin C (ASCORBIC ACID) 500 MG tablet Take 500 mg by mouth daily.     No current facility-administered medications on file prior to visit.    Allergies  Allergen Reactions  . Buprenorphine Hcl Shortness Of Breath    Throat swelling/trouble breathing and lethargic  . Morphine And Related Shortness Of Breath    Throat swelling/trouble breathing and lethargic  . Celebrex  [Celecoxib] Diarrhea and Swelling    Swelling of legs   . Vioxx [Rofecoxib] Palpitations  . Codeine Other (See Comments)    Bloated     Objective: Physical Exam  General: Well developed, nourished, no acute distress, awake, alert and oriented x 3  Vascular: Dorsalis pedis artery 2/4 bilateral, Posterior tibial artery 2/4 bilateral, skin temperature warm to warm proximal to distal bilateral lower extremities, no varicosities, pedal hair present bilateral.  Neurological: Gross sensation present via light touch bilateral.   Dermatological: Skin is warm, dry, and supple bilateral, Nails 1-10 are tender, long, thick, and discolored with mild subungal debris, no webspace macerations present bilateral, no open lesions present bilateral, + corn/hyperkeratotic tissue present right 3rd toe. No signs of infection bilateral.  Musculoskeletal: Asymptomatic hammertoe boney deformities noted bilateral. Muscular strength within normal limits without painon range of motion. No pain with calf compression bilateral.  Assessment and Plan:  Problem List Items Addressed This Visit    None    Visit Diagnoses    Pain due to onychomycosis of toenails of both feet    -  Primary   Corns and callosities       Toe pain, bilateral          -Examined patient.  -Re-Discussed treatment options for painful mycotic nails/corn. -Mechanically debrided and reduced mycotic nails with sterile nail nipper and dremel nail file without incident. -At no charge trimmed corn at right 3rd toe distal tuft -Continue with Penlac to nails  -Patient to return in 3 months for follow up evaluation or sooner if symptoms worsen.  Landis Martins, DPM

## 2019-09-19 ENCOUNTER — Telehealth: Payer: Self-pay

## 2019-09-22 ENCOUNTER — Ambulatory Visit: Payer: Self-pay

## 2019-09-22 DIAGNOSIS — G3184 Mild cognitive impairment, so stated: Secondary | ICD-10-CM

## 2019-09-22 DIAGNOSIS — I130 Hypertensive heart and chronic kidney disease with heart failure and stage 1 through stage 4 chronic kidney disease, or unspecified chronic kidney disease: Secondary | ICD-10-CM

## 2019-09-22 NOTE — Chronic Care Management (AMB) (Signed)
Chronic Care Management    Social Work Follow Up Note  09/22/2019 Name: Sara Fox MRN: [id number] DOB: June 01, 1940  Sara Fox is a 79 y.o. year old female who is a primary care patient of [name], [place]. The CCM team was consulted for assistance with care coordination.   Review of patient status, including review of consultants reports, other relevant assessments, and collaboration with appropriate care team members and the patient's provider was performed as part of comprehensive patient evaluation and provision of chronic care management services.    SDOH (Social Determinants of Health) assessments performed: No    Outpatient Encounter Medications as of 09/22/2019  Medication Sig  . aspirin EC 81 MG tablet Take 81 mg by mouth daily.  . budesonide-formoterol (SYMBICORT) 160-4.5 MCG/ACT inhaler Inhale 2 puffs into the lungs 2 (two) times daily.  Marland Kitchen CALCIUM CITRATE PO Take 1 tablet by mouth daily.   . Cholecalciferol (VITAMIN D3) 2000 UNITS capsule Take 2,000 Units by mouth daily.  . ciclopirox (PENLAC) 8 % solution Apply topically at bedtime. Apply over nail and surrounding skin. Apply daily over previous coat. After seven (7) days, file nail and continue cycle.  . donepezil (ARICEPT) 5 MG tablet TAKE 1 TABLET BY MOUTH EVERYDAY AT BEDTIME  . furosemide (LASIX) 20 MG tablet TAKE 1 TABLET BY MOUTH EVERY OTHER DAY  . gabapentin (NEURONTIN) 100 MG capsule TAKE 1 CAPSULE BY MOUTH THREE TIMES DAILY  . Magnesium 250 MG TABS Take 1 tablet by mouth with evening meal daily  . metoprolol tartrate (LOPRESSOR) 25 MG tablet TAKE 1 TABLET BY MOUTH TWICE DAILY WITH A MEAL  . Multiple Vitamin (MULTIVITAMIN) capsule Take 1 capsule by mouth daily. Contains iron  . nitroGLYCERIN (NITROSTAT) 0.4 MG SL tablet PLACE ONE TABLET UNDER THE TONGUE EVERY 5 MINUTES AS NEEDED FOR CHEST PAIN.  Marland Kitchen oxybutynin (DITROPAN) 5 MG tablet Take 1 tablet (5 mg total) by mouth 2 (two) times daily.  . pantoprazole  (PROTONIX) 40 MG tablet Take 1 tablet (40 mg total) by mouth 2 (two) times daily.  . polyethylene glycol (MIRALAX / GLYCOLAX) packet Take 17 g by mouth daily as needed for mild constipation.   . Potassium Chloride ER 20 MEQ TBCR TAKE 2 TABLETS BY MOUTH IN THE MORNING  . rosuvastatin (CRESTOR) 40 MG tablet Take 1 tablet (40 mg total) by mouth daily.  . traMADol (ULTRAM) 50 MG tablet TAKE 1-2 TABLETS THREE TIMES DAILY.  . vitamin C (ASCORBIC ACID) 500 MG tablet Take 500 mg by mouth daily.   No facility-administered encounter medications on file as of 09/22/2019.     Goals Addressed            This Visit's Progress   . Collaborate with RN Case Manager to assist with care coordination needs   On track    [place] (see longtitudinal plan of care for additional care plan information)  Current Barriers:  . Social Isolation . Memory Deficits . HTN, CHF, CKD III, mild cognitive impairment  Social Work Clinical Goal(s):  Marland Kitchen Over the next 90 days, patient will work with care management team to address needs related to care coordination needs and caregiver asistance.  CCM SW Interventions: Completed 09/22/19 . Outbound call placed to the patient to assist with care coordination needs . Determined the patient continues to be interested in [facility] but is unsure if she is willing to enroll at this time . Encouraged the patient to contact [facility] to schedule a tour when her son is available to attend . Scheduled follow up call over the next 45 days   Patient Self Care Activities:  . Attends all scheduled provider appointments . Calls provider office for new concerns or questions . Supportive family to assist with patient care needs  Please see past updates related to this goal by clicking on the "Past Updates" button in the selected goal          Follow Up Plan: SW will follow up with patient by phone over the next 45 days.   [name], BSW, CDP Social  Worker, Certified Dementia Practitioner [place] [facility] [phone]  Total time spent performing care coordination and/or care management activities with the patient by phone or face to face = 8 minutes.

## 2019-09-22 NOTE — Patient Instructions (Signed)
Social Worker Visit Information  Goals we discussed today:  Goals Addressed            This Visit's Progress   . Collaborate with RN Case Manager to assist with care coordination needs   On track    [place] (see longtitudinal plan of care for additional care plan information)  Current Barriers:  . Social Isolation . Memory Deficits . HTN, CHF, CKD III, mild cognitive impairment  Social Work Clinical Goal(s):  Marland Kitchen Over the next 90 days, patient will work with care management team to address needs related to care coordination needs and caregiver asistance.  CCM SW Interventions: Completed 09/22/19 . Outbound call placed to the patient to assist with care coordination needs . Determined the patient continues to be interested in [facility] but is unsure if she is willing to enroll at this time . Encouraged the patient to contact [facility] to schedule a tour when her son is available to attend . Scheduled follow up call over the next 45 days   Patient Self Care Activities:  . Attends all scheduled provider appointments . Calls provider office for new concerns or questions . Supportive family to assist with patient care needs  Please see past updates related to this goal by clicking on the "Past Updates" button in the selected goal          Follow Up Plan: SW will follow up with patient by phone over the next 45 days   [name], BSW, CDP Social Worker, Certified Dementia Practitioner [place] [facility] [phone]

## 2019-09-24 ENCOUNTER — Ambulatory Visit: Payer: Medicare Other

## 2019-09-24 ENCOUNTER — Ambulatory Visit (INDEPENDENT_AMBULATORY_CARE_PROVIDER_SITE_OTHER): Payer: Medicare Other

## 2019-09-24 DIAGNOSIS — I130 Hypertensive heart and chronic kidney disease with heart failure and stage 1 through stage 4 chronic kidney disease, or unspecified chronic kidney disease: Secondary | ICD-10-CM

## 2019-09-24 DIAGNOSIS — N1831 Chronic kidney disease, stage 3a: Secondary | ICD-10-CM

## 2019-09-24 DIAGNOSIS — I5022 Chronic systolic (congestive) heart failure: Secondary | ICD-10-CM | POA: Diagnosis not present

## 2019-09-24 DIAGNOSIS — I10 Essential (primary) hypertension: Secondary | ICD-10-CM | POA: Diagnosis not present

## 2019-09-24 NOTE — Chronic Care Management (AMB) (Signed)
Chronic Care Management    Social Work Follow Up Note  09/24/2019 Name: Sara Fox MRN: [id number] DOB: 02/02/1941  Sara Fox is a 79 y.o. year old female who is a primary care patient of [name], [place]. The CCM team was consulted for assistance with care coordination.   Review of patient status, including review of consultants reports, other relevant assessments, and collaboration with appropriate care team members and the patient's provider was performed as part of comprehensive patient evaluation and provision of chronic care management services.    SDOH (Social Determinants of Health) assessments performed: No    Outpatient Encounter Medications as of 09/24/2019  Medication Sig  . aspirin EC 81 MG tablet Take 81 mg by mouth daily.  . budesonide-formoterol (SYMBICORT) 160-4.5 MCG/ACT inhaler Inhale 2 puffs into the lungs 2 (two) times daily.  Marland Kitchen CALCIUM CITRATE PO Take 1 tablet by mouth daily.   . Cholecalciferol (VITAMIN D3) 2000 UNITS capsule Take 2,000 Units by mouth daily.  . ciclopirox (PENLAC) 8 % solution Apply topically at bedtime. Apply over nail and surrounding skin. Apply daily over previous coat. After seven (7) days, file nail and continue cycle.  . donepezil (ARICEPT) 5 MG tablet TAKE 1 TABLET BY MOUTH EVERYDAY AT BEDTIME  . furosemide (LASIX) 20 MG tablet TAKE 1 TABLET BY MOUTH EVERY OTHER DAY  . gabapentin (NEURONTIN) 100 MG capsule TAKE 1 CAPSULE BY MOUTH THREE TIMES DAILY  . Magnesium 250 MG TABS Take 1 tablet by mouth with evening meal daily  . metoprolol tartrate (LOPRESSOR) 25 MG tablet TAKE 1 TABLET BY MOUTH TWICE DAILY WITH A MEAL  . Multiple Vitamin (MULTIVITAMIN) capsule Take 1 capsule by mouth daily. Contains iron  . nitroGLYCERIN (NITROSTAT) 0.4 MG SL tablet PLACE ONE TABLET UNDER THE TONGUE EVERY 5 MINUTES AS NEEDED FOR CHEST PAIN.  Marland Kitchen oxybutynin (DITROPAN) 5 MG tablet Take 1 tablet (5 mg total) by mouth 2 (two) times daily.  . pantoprazole  (PROTONIX) 40 MG tablet Take 1 tablet (40 mg total) by mouth 2 (two) times daily.  . polyethylene glycol (MIRALAX / GLYCOLAX) packet Take 17 g by mouth daily as needed for mild constipation.   . Potassium Chloride ER 20 MEQ TBCR TAKE 2 TABLETS BY MOUTH IN THE MORNING  . rosuvastatin (CRESTOR) 40 MG tablet Take 1 tablet (40 mg total) by mouth daily.  . traMADol (ULTRAM) 50 MG tablet TAKE 1-2 TABLETS THREE TIMES DAILY.  . vitamin C (ASCORBIC ACID) 500 MG tablet Take 500 mg by mouth daily.   No facility-administered encounter medications on file as of 09/24/2019.     Goals Addressed            This Visit's Progress     Patient Stated   . "I missed my COVID 19 vaccine" (pt-stated)       CARE PLAN ENTRY (see longitudinal plan of care for additional care plan information)  Current Barriers:  . Limited access to caregiver . Limited access to transportation . Chronic conditions putting patient at risk for severe infection from COVID 19 including Stage 3a CKD, HTN, and Chronic systolic heart failure  Social Work Clinical Goal(s):  Marland Kitchen Over the next 5 days the patient will receive second COVID 19 vaccine from [facility] as instructed by SW.  CCM SW Interventions: Completed 09/24/19 . Inter-disciplinary care team collaboration (see longitudinal plan of care) . Inbound call received from the patient whom reports she missed her second COVID 19 vaccination scheduled for this  morning due to lack of transportation . Determined the patient would like assistance scheduling an appointment for Friday morning when her son will be able to provide transportation . [place] 19 vaccination portal to determine there are no appointments in [place] vaccine for Friday . Collaboration with the patients primary provider office to discuss opportunity for the patient to receive vaccine through clinic on Friday. Unfortunately the clinic is only administering Moderna  vaccines at this time and the patient must take a Pfizer vaccine as that coincides with first dose . Collaboration with RN Care Manager to review alternative options for the patient to receive vaccine within timeframe identified . Outbound call placed to the patient to discuss opportunities available to receive vaccine . Provided verbal education on the in home vaccine program offered by [facility] to assist homebound patients o Patient agreeable to in home vaccination . Referral placed to [facility], spoke with Oley Balm o Confirmed patient will received second dose Pfizer in her home on 09/25/19 . Outbound call placed to the patient to confirm appointment date o Advised the patient to contact SW if needed . Collaboration with patient primary care provider to inform of patient plan to receive vaccination in the home . Scheduled follow up call over the next 3 business days to confirm goal met  Patient Self Care Activities:  . Patient verbalizes understanding of plan to receive COVID 19 vaccine in the home on 5/13 . Self administers medications as prescribed . Calls pharmacy for medication refills . Calls provider office for new concerns or questions  Initial goal documentation         Follow Up Plan: SW will follow up with patient by phone over the next 3 business days.   [name], BSW, CDP Social Worker, Certified Dementia Practitioner [place] [facility] [place] [facility] [phone]  Total time spent performing care coordination and/or care management activities with the patient by phone or face to face = 34 minutes.

## 2019-09-24 NOTE — Patient Instructions (Signed)
Social Worker Visit Information  Goals we discussed today:  Goals Addressed            This Visit's Progress     Patient Stated   . "I missed my COVID 19 vaccine" (pt-stated)       CARE PLAN ENTRY (see longitudinal plan of care for additional care plan information)  Current Barriers:  . Limited access to caregiver . Limited access to transportation . Chronic conditions putting patient at risk for severe infection from COVID 19 including Stage 3a CKD, HTN, and Chronic systolic heart failure  Social Work Clinical Goal(s):  Marland Kitchen Over the next 5 days the patient will receive second COVID 19 vaccine from [facility] as instructed by SW.  CCM SW Interventions: Completed 09/24/19 . Inter-disciplinary care team collaboration (see longitudinal plan of care) . Inbound call received from the patient whom reports she missed her second COVID 19 vaccination scheduled for this morning due to lack of transportation . Determined the patient would like assistance scheduling an appointment for Friday morning when her son will be able to provide transportation . [place] 19 vaccination portal to determine there are no appointments in [place] vaccine for Friday . Collaboration with the patients primary provider office to discuss opportunity for the patient to receive vaccine through clinic on Friday. Unfortunately the clinic is only administering Moderna vaccines at this time and the patient must take a Pfizer vaccine as that coincides with first dose . Collaboration with RN Care Manager to review alternative options for the patient to receive vaccine within timeframe identified . Outbound call placed to the patient to discuss opportunities available to receive vaccine . Provided verbal education on the in home vaccine program offered by [facility] to assist homebound patients o Patient agreeable to in home vaccination . Referral placed to [facility], spoke with Oley Balm o Confirmed patient will received second dose Pfizer in her home on 09/25/19 . Outbound call placed to the patient to confirm appointment date o Advised the patient to contact SW if needed . Collaboration with patient primary care provider to inform of patient plan to receive vaccination in the home . Scheduled follow up call over the next 3 business days to confirm goal met  Patient Self Care Activities:  . Patient verbalizes understanding of plan to receive COVID 19 vaccine in the home on 5/13 . Self administers medications as prescribed . Calls pharmacy for medication refills . Calls provider office for new concerns or questions  Initial goal documentation         Materials Provided: Verbal education about COVID 19 vaccination options provided by phone  Follow Up Plan: SW will follow up with patient by phone over the next 3 business days.   [name], BSW, CDP Social Worker, Certified Dementia Practitioner [place] [facility] [place] [facility] [phone]

## 2019-09-26 ENCOUNTER — Ambulatory Visit: Payer: Self-pay

## 2019-09-26 DIAGNOSIS — N1831 Chronic kidney disease, stage 3a: Secondary | ICD-10-CM

## 2019-09-26 DIAGNOSIS — I10 Essential (primary) hypertension: Secondary | ICD-10-CM

## 2019-09-26 DIAGNOSIS — I5022 Chronic systolic (congestive) heart failure: Secondary | ICD-10-CM | POA: Diagnosis not present

## 2019-09-26 DIAGNOSIS — I130 Hypertensive heart and chronic kidney disease with heart failure and stage 1 through stage 4 chronic kidney disease, or unspecified chronic kidney disease: Secondary | ICD-10-CM | POA: Diagnosis not present

## 2019-09-26 NOTE — Patient Instructions (Signed)
Social Worker Visit Information  Goals we discussed today:  Goals Addressed            This Visit's Progress     Patient Stated   . COMPLETED: "I missed my COVID 19 vaccine" (pt-stated)       CARE PLAN ENTRY (see longitudinal plan of care for additional care plan information)  Current Barriers:  . Limited access to caregiver . Limited access to transportation . Chronic conditions putting patient at risk for severe infection from COVID 19 including Stage 3a CKD, HTN, and Chronic systolic heart failure  Social Work Clinical Goal(s):  Marland Kitchen Over the next 5 days the patient will receive second COVID 19 vaccine from [facility] as instructed by SW.  CCM SW Interventions: Completed 09/26/19 with [name] . Inter-disciplinary care team collaboration (see longitudinal plan of care) . Successful outbound call placed to the patients home, spoke with the patients son and caregiver [name] . Confirmed the patient did receive a second [place] dose on 09/25/19 from [place] o No adverse reaction noted at this time . Goal Met  Patient Self Care Activities:  . Patient verbalizes understanding of plan to receive COVID 19 vaccine in the home on 5/13 . Self administers medications as prescribed . Calls pharmacy for medication refills . Calls provider office for new concerns or questions  Please see past updates related to this goal by clicking on the "Past Updates" button in the selected goal        Other   . Collaborate with RN Case Manager to assist with care coordination needs   On track    [place] (see longtitudinal plan of care for additional care plan information)  Current Barriers:  . Social Isolation . Memory Deficits . HTN, CHF, CKD III, mild cognitive impairment  Social Work Clinical Goal(s):  Marland Kitchen Over the next 90 days, patient will work with care management team to address needs related to care coordination needs and caregiver asistance.  CCM SW  Interventions: Completed 09/16/19 with [name] . Outbound call placed to the patients home, spoke with the patients son and caregiver, [name] . Discussed the patients continued interest in [facility] with [name] reports he wants the patient to be engaged with others outside of the home "my mom used to always be on the go. This is not like her" . Advised [name] the patient has expressed interest in touring [facility] on several past phone calls with SW . Obtained verbal consent to place referral to [facility] to initiate contact and schedule a tour . Successful outbound call placed to [name] with [facility] to verbally refer the patient o [name] reports she will outreach the patient to provide education on the program and discuss planning a tour if the patient is interested . Scheduled follow up call over the next month to assess goal progression o Advised [name] to contact SW as needed prior to next planned outreach  Patient Self Care Activities:  . Attends all scheduled provider appointments . Calls provider office for new concerns or questions . Supportive family to assist with patient care needs  Please see past updates related to this goal by clicking on the "Past Updates" button in the selected goal          Materials Provided: Verbal education about [facility] provided by phone  Follow Up Plan: SW will follow  up with patient by phone over the next month.  [name], BSW, CDP Social Worker, Certified Dementia Practitioner [place] [facility] [place] [facility] [phone]

## 2019-09-26 NOTE — Chronic Care Management (AMB) (Signed)
Chronic Care Management    Social Work Follow Up Note  09/26/2019 Name: Sara Fox MRN: [id number] DOB: [birth year]  Sara Fox is a 79 y.o. year old female who is a primary care patient of [name], [place]. The CCM team was consulted for assistance with care coordination.   Review of patient status, including review of consultants reports, other relevant assessments, and collaboration with appropriate care team members and the patient's provider was performed as part of comprehensive patient evaluation and provision of chronic care management services.    SDOH (Social Determinants of Health) assessments performed: No    Outpatient Encounter Medications as of 09/26/2019  Medication Sig  . aspirin EC 81 MG tablet Take 81 mg by mouth daily.  . budesonide-formoterol (SYMBICORT) 160-4.5 MCG/ACT inhaler Inhale 2 puffs into the lungs 2 (two) times daily.  Marland Kitchen CALCIUM CITRATE PO Take 1 tablet by mouth daily.   . Cholecalciferol (VITAMIN D3) 2000 UNITS capsule Take 2,000 Units by mouth daily.  . ciclopirox (PENLAC) 8 % solution Apply topically at bedtime. Apply over nail and surrounding skin. Apply daily over previous coat. After seven (7) days, file nail and continue cycle.  . donepezil (ARICEPT) 5 MG tablet TAKE 1 TABLET BY MOUTH EVERYDAY AT BEDTIME  . furosemide (LASIX) 20 MG tablet TAKE 1 TABLET BY MOUTH EVERY OTHER DAY  . gabapentin (NEURONTIN) 100 MG capsule TAKE 1 CAPSULE BY MOUTH THREE TIMES DAILY  . Magnesium 250 MG TABS Take 1 tablet by mouth with evening meal daily  . metoprolol tartrate (LOPRESSOR) 25 MG tablet TAKE 1 TABLET BY MOUTH TWICE DAILY WITH A MEAL  . Multiple Vitamin (MULTIVITAMIN) capsule Take 1 capsule by mouth daily. Contains iron  . nitroGLYCERIN (NITROSTAT) 0.4 MG SL tablet PLACE ONE TABLET UNDER THE TONGUE EVERY 5 MINUTES AS NEEDED FOR CHEST PAIN.  Marland Kitchen oxybutynin (DITROPAN) 5 MG tablet Take 1 tablet (5 mg total) by mouth 2 (two) times daily.  . pantoprazole  (PROTONIX) 40 MG tablet Take 1 tablet (40 mg total) by mouth 2 (two) times daily.  . polyethylene glycol (MIRALAX / GLYCOLAX) packet Take 17 g by mouth daily as needed for mild constipation.   . Potassium Chloride ER 20 MEQ TBCR TAKE 2 TABLETS BY MOUTH IN THE MORNING  . rosuvastatin (CRESTOR) 40 MG tablet Take 1 tablet (40 mg total) by mouth daily.  . traMADol (ULTRAM) 50 MG tablet TAKE 1-2 TABLETS THREE TIMES DAILY.  . vitamin C (ASCORBIC ACID) 500 MG tablet Take 500 mg by mouth daily.   No facility-administered encounter medications on file as of 09/26/2019.     Goals Addressed            This Visit's Progress     Patient Stated   . COMPLETED: "I missed my COVID 19 vaccine" (pt-stated)       CARE PLAN ENTRY (see longitudinal plan of care for additional care plan information)  Current Barriers:  . Limited access to caregiver . Limited access to transportation . Chronic conditions putting patient at risk for severe infection from COVID 19 including Stage 3a CKD, HTN, and Chronic systolic heart failure  Social Work Clinical Goal(s):  Marland Kitchen Over the next 5 days the patient will receive second COVID 19 vaccine from [facility] as instructed by SW.  CCM SW Interventions: Completed 09/26/19 with [name] . Inter-disciplinary care team collaboration (see longitudinal plan of care) . Successful outbound call placed to the patients home, spoke with the patients son and  caregiver [name] . Confirmed the patient did receive a second [place] dose on 09/25/19 from [place] o No adverse reaction noted at this time . Goal Met  Patient Self Care Activities:  . Patient verbalizes understanding of plan to receive COVID 19 vaccine in the home on 5/13 . Self administers medications as prescribed . Calls pharmacy for medication refills . Calls provider office for new concerns or questions  Please see past updates related to this goal by clicking on the "Past Updates" button in the  selected goal        Other   . Collaborate with RN Case Manager to assist with care coordination needs   On track    [place] (see longtitudinal plan of care for additional care plan information)  Current Barriers:  . Social Isolation . Memory Deficits . HTN, CHF, CKD III, mild cognitive impairment  Social Work Clinical Goal(s):  Marland Kitchen Over the next 90 days, patient will work with care management team to address needs related to care coordination needs and caregiver asistance.  CCM SW Interventions: Completed 09/16/19 with [name] . Outbound call placed to the patients home, spoke with the patients son and caregiver, [name] . Discussed the patients continued interest in [facility] with [name] reports he wants the patient to be engaged with others outside of the home "my mom used to always be on the go. This is not like her" . Advised [name] the patient has expressed interest in touring [facility] on several past phone calls with SW . Obtained verbal consent to place referral to [facility] to initiate contact and schedule a tour . Successful outbound call placed to [name] with [facility] to verbally refer the patient o [name] reports she will outreach the patient to provide education on the program and discuss planning a tour if the patient is interested . Scheduled follow up call over the next month to assess goal progression o Advised [name] to contact SW as needed prior to next planned outreach  Patient Self Care Activities:  . Attends all scheduled provider appointments . Calls provider office for new concerns or questions . Supportive family to assist with patient care needs  Please see past updates related to this goal by clicking on the "Past Updates" button in the selected goal          Follow Up Plan: SW will follow up with patient by phone over the next month.   [name], BSW, CDP Social  Worker, Certified Dementia Practitioner [place] [facility] [place] [facility] [phone]  Total time spent performing care coordination and/or care management activities with the patient by phone or face to face = 18 minutes.

## 2019-09-30 ENCOUNTER — Ambulatory Visit: Payer: Self-pay

## 2019-09-30 DIAGNOSIS — I5022 Chronic systolic (congestive) heart failure: Secondary | ICD-10-CM

## 2019-09-30 DIAGNOSIS — I10 Essential (primary) hypertension: Secondary | ICD-10-CM

## 2019-09-30 DIAGNOSIS — N1831 Chronic kidney disease, stage 3a: Secondary | ICD-10-CM

## 2019-09-30 NOTE — Patient Instructions (Addendum)
Social Worker Visit Information  Goals we discussed today:  Goals Addressed            This Visit's Progress   . Collaborate with RN Case Manager to assist with care coordination needs       CARE PLAN ENTRY (see longtitudinal plan of care for additional care plan information)  Current Barriers:  . Social Isolation . Memory Deficits . HTN, CHF, CKD III, mild cognitive impairment  Social Work Clinical Goal(s):  Marland Kitchen Over the next 90 days, patient will work with care management team to address needs related to care coordination needs and caregiver asistance.  CCM SW Interventions: Completed 09/30/19 . SW received voice message from [name], intake coordinator with [facility] whom indicated successful contact with the patient as well as patient interest in enrollment. [name] reports awaiting communication from [facility] regarding patient ability to enroll as well as possibility of monthly co-payment . SW to continue to follow  Completed 09/16/19 with [name] . Outbound call placed to the patients home, spoke with the patients son and caregiver, [name] . Discussed the patients continued interest in [facility] with [name] reports he wants the patient to be engaged with others outside of the home "my mom used to always be on the go. This is not like her" . Advised [name] the patient has expressed interest in touring [facility] on several past phone calls with SW . Obtained verbal consent to place referral to [facility] to initiate contact and schedule a tour . Successful outbound call placed to [name] with [facility] to verbally refer the patient o [name] reports she will outreach the patient to provide education on the program and discuss planning a tour if the patient is interested . Scheduled follow up call over the next month to assess goal progression o Advised [name] to contact SW as needed prior to  next planned outreach  Patient Self Care Activities:  . Attends all scheduled provider appointments . Calls provider office for new concerns or questions . Supportive family to assist with patient care needs  Please see past updates related to this goal by clicking on the "Past Updates" button in the selected goal          Follow Up Plan: SW will follow up with patient by phone over the next month.  [name], BSW, CDP Social Worker, Certified Dementia Practitioner [place] [facility] [place] [facility] [phone]

## 2019-09-30 NOTE — Chronic Care Management (AMB) (Signed)
Chronic Care Management    Social Work Follow Up Note  09/30/2019 Name: Sara Fox MRN: [id number] DOB: 17-Dec-1940  Sara Fox is a 79 y.o. year old female who is a primary care patient of Sara Fox, [place]. The CCM team was consulted for assistance with care coordination.   Review of patient status, including review of consultants reports, other relevant assessments, and collaboration with appropriate care team members and the patient's provider was performed as part of comprehensive patient evaluation and provision of chronic care management services.    SDOH (Social Determinants of Health) assessments performed: No    Outpatient Encounter Medications as of 09/30/2019  Medication Sig  . aspirin EC 81 MG tablet Take 81 mg by mouth daily.  . budesonide-formoterol (SYMBICORT) 160-4.5 MCG/ACT inhaler Inhale 2 puffs into the lungs 2 (two) times daily.  Marland Kitchen CALCIUM CITRATE PO Take 1 tablet by mouth daily.   . Cholecalciferol (VITAMIN D3) 2000 UNITS capsule Take 2,000 Units by mouth daily.  . ciclopirox (PENLAC) 8 % solution Apply topically at bedtime. Apply over nail and surrounding skin. Apply daily over previous coat. After seven (7) days, file nail and continue cycle.  . donepezil (ARICEPT) 5 MG tablet TAKE 1 TABLET BY MOUTH EVERYDAY AT BEDTIME  . furosemide (LASIX) 20 MG tablet TAKE 1 TABLET BY MOUTH EVERY OTHER DAY  . gabapentin (NEURONTIN) 100 MG capsule TAKE 1 CAPSULE BY MOUTH THREE TIMES DAILY  . Magnesium 250 MG TABS Take 1 tablet by mouth with evening meal daily  . metoprolol tartrate (LOPRESSOR) 25 MG tablet TAKE 1 TABLET BY MOUTH TWICE DAILY WITH A MEAL  . Multiple Vitamin (MULTIVITAMIN) capsule Take 1 capsule by mouth daily. Contains iron  . nitroGLYCERIN (NITROSTAT) 0.4 MG SL tablet PLACE ONE TABLET UNDER THE TONGUE EVERY 5 MINUTES AS NEEDED FOR CHEST PAIN.  Marland Kitchen oxybutynin (DITROPAN) 5 MG tablet Take 1 tablet (5 mg total) by mouth 2 (two) times daily.  . pantoprazole  (PROTONIX) 40 MG tablet Take 1 tablet (40 mg total) by mouth 2 (two) times daily.  . polyethylene glycol (MIRALAX / GLYCOLAX) packet Take 17 g by mouth daily as needed for mild constipation.   . Potassium Chloride ER 20 MEQ TBCR TAKE 2 TABLETS BY MOUTH IN THE MORNING  . rosuvastatin (CRESTOR) 40 MG tablet Take 1 tablet (40 mg total) by mouth daily.  . traMADol (ULTRAM) 50 MG tablet TAKE 1-2 TABLETS THREE TIMES DAILY.  . vitamin C (ASCORBIC ACID) 500 MG tablet Take 500 mg by mouth daily.   No facility-administered encounter medications on file as of 09/30/2019.     Goals Addressed            This Visit's Progress   . Collaborate with RN Case Manager to assist with care coordination needs       CARE PLAN ENTRY (see longtitudinal plan of care for additional care plan information)  Current Barriers:  . Social Isolation . Memory Deficits . HTN, CHF, CKD III, mild cognitive impairment  Social Work Clinical Goal(s):  Marland Kitchen Over the next 90 days, patient will work with care management team to address needs related to care coordination needs and caregiver asistance.  CCM SW Interventions: Completed 09/30/19 . SW received voice message from Sara Fox, intake coordinator with [facility] whom indicated successful contact with the patient as well as patient interest in enrollment. Sara Fox reports awaiting communication from [place] regarding patient ability to enroll as well as possibility of monthly  co-payment . SW to continue to follow  Completed 09/16/19 with Sara Fox . Outbound call placed to the patients home, spoke with the patients son and caregiver, Sara Fox . Discussed the patients continued interest in [facility] with Sara Fox reports he wants the patient to be engaged with others outside of the home "my mom used to always be on the go. This is not like her" . Advised Sara Fox the patient has expressed interest in touring [facility] on several  past phone calls with SW . Obtained verbal consent to place referral to [facility] to initiate contact and schedule a tour . Successful outbound call placed to Sara Fox with [facility] to verbally refer the patient o Sara Fox reports she will outreach the patient to provide education on the program and discuss planning a tour if the patient is interested . Scheduled follow up call over the next month to assess goal progression o Advised Sara Fox to contact SW as needed prior to next planned outreach  Patient Self Care Activities:  . Attends all scheduled provider appointments . Calls provider office for new concerns or questions . Supportive family to assist with patient care needs  Please see past updates related to this goal by clicking on the "Past Updates" button in the selected goal          Follow Up Plan: SW will follow up with patient by phone over the next month.   Sara Fox, BSW, CDP Social Worker, Certified Dementia Practitioner [place] [facility] [place] [facility] [phone]  Total time spent performing care coordination and/or care management activities with the patient by phone or face to face = 6 minutes.

## 2019-10-01 ENCOUNTER — Other Ambulatory Visit: Payer: Self-pay | Admitting: Nurse Practitioner

## 2019-10-01 ENCOUNTER — Other Ambulatory Visit: Payer: Self-pay | Admitting: Internal Medicine

## 2019-10-01 ENCOUNTER — Telehealth: Payer: Self-pay | Admitting: Internal Medicine

## 2019-10-01 DIAGNOSIS — R32 Unspecified urinary incontinence: Secondary | ICD-10-CM

## 2019-10-01 NOTE — Telephone Encounter (Signed)
Called patient 10/01/19 to schedule for follow up visit. Left message for patient to return call to get appointment with Dr. Debara Pickett

## 2019-10-14 ENCOUNTER — Other Ambulatory Visit: Payer: Self-pay | Admitting: Internal Medicine

## 2019-10-21 ENCOUNTER — Ambulatory Visit (INDEPENDENT_AMBULATORY_CARE_PROVIDER_SITE_OTHER): Payer: Medicare Other

## 2019-10-21 ENCOUNTER — Telehealth: Payer: Self-pay

## 2019-10-21 ENCOUNTER — Other Ambulatory Visit: Payer: Self-pay

## 2019-10-21 DIAGNOSIS — I25118 Atherosclerotic heart disease of native coronary artery with other forms of angina pectoris: Secondary | ICD-10-CM

## 2019-10-21 DIAGNOSIS — N1831 Chronic kidney disease, stage 3a: Secondary | ICD-10-CM

## 2019-10-21 DIAGNOSIS — G3184 Mild cognitive impairment, so stated: Secondary | ICD-10-CM

## 2019-10-21 DIAGNOSIS — I10 Essential (primary) hypertension: Secondary | ICD-10-CM

## 2019-10-21 DIAGNOSIS — I5022 Chronic systolic (congestive) heart failure: Secondary | ICD-10-CM

## 2019-10-23 NOTE — Chronic Care Management (AMB) (Signed)
Chronic Care Management   Initial Visit Note  10/23/2019 Name: Sara Fox MRN: [id number] DOB: 26-Apr-1941  Referred by: Sara Felts, FNP Reason for referral : Chronic Care Management (RQ #3 RN Initial Call-dementia)   Sara Fox is a 79 y.o. year old female who is a primary care patient of Sara Felts, FNP. The CCM team was consulted for assistance with chronic disease management and care coordination needs related to Stage 3a chronic kidney disease, HTN, chronic systolic heart failure, Mild Cognitive Impairment, Atherosclerotic heart disease of native coronary artery with other forms of angina pectoris.  Review of patient status, including review of consultants reports, relevant laboratory and other test results, and collaboration with appropriate care team members and the patient's Sara Fox was performed as part of comprehensive patient evaluation and provision of chronic care management services.    SDOH (Social Determinants of Health) assessments performed: Yes - no acute needs See Care Plan activities for detailed interventions related to SDOH     Medications: Outpatient Encounter Medications as of 10/21/2019  Medication Sig  . aspirin EC 81 MG tablet Take 81 mg by mouth daily.  . budesonide-formoterol (SYMBICORT) 160-4.5 MCG/ACT inhaler Inhale 2 puffs into the lungs 2 (two) times daily.  Marland Kitchen CALCIUM CITRATE PO Take 1 tablet by mouth daily.   . Cholecalciferol (VITAMIN D3) 2000 UNITS capsule Take 2,000 Units by mouth daily.  . ciclopirox (PENLAC) 8 % solution Apply topically at bedtime. Apply over nail and surrounding skin. Apply daily over previous coat. After seven (7) days, file nail and continue cycle.  . donepezil (ARICEPT) 5 MG tablet TAKE 1 TABLET BY MOUTH EVERYDAY AT BEDTIME  . furosemide (LASIX) 20 MG tablet TAKE 1 TABLET BY MOUTH EVERY OTHER DAY  . gabapentin (NEURONTIN) 100 MG capsule TAKE 1 CAPSULE BY MOUTH THREE TIMES DAILY  . Magnesium 250 MG TABS Take 1 tablet  by mouth with evening meal daily  . metoprolol tartrate (LOPRESSOR) 25 MG tablet Take 1 tablet (25 mg total) by mouth 2 (two) times daily.  . Multiple Vitamin (MULTIVITAMIN) capsule Take 1 capsule by mouth daily. Contains iron  . nitroGLYCERIN (NITROSTAT) 0.4 MG SL tablet PLACE ONE TABLET UNDER THE TONGUE EVERY 5 MINUTES AS NEEDED FOR CHEST PAIN.  Marland Kitchen oxybutynin (DITROPAN) 5 MG tablet TAKE 1 TABLET BY MOUTH TWICE A DAY  . pantoprazole (PROTONIX) 40 MG tablet Take 1 tablet (40 mg total) by mouth 2 (two) times daily.  . polyethylene glycol (MIRALAX / GLYCOLAX) packet Take 17 g by mouth daily as needed for mild constipation.   . Potassium Chloride ER 20 MEQ TBCR TAKE 2 TABLETS BY MOUTH IN THE MORNING  . rosuvastatin (CRESTOR) 40 MG tablet Take 1 tablet (40 mg total) by mouth daily.  . traMADol (ULTRAM) 50 MG tablet TAKE 1-2 TABLETS THREE TIMES DAILY.  . vitamin C (ASCORBIC ACID) 500 MG tablet Take 500 mg by mouth daily.   No facility-administered encounter medications on file as of 10/21/2019.     Objective:  No results found for: HGBA1C Lab Results  Component Value Date   MICROALBUR 150 12/05/2018   LDLCALC 76 12/05/2018   CREATININE 1.38 (H) 07/10/2019   BP Readings from Last 3 Encounters:  09/11/19 110/60  08/08/19 125/80  07/10/19 114/64    Goals Addressed    . "to keep her dementia under good control"       Son stated CARE PLAN ENTRY (see longitudinal plan of care for additional care plan information)  Current  Barriers:  Marland Kitchen Knowledge Deficits related to disease process and Self Health management of dementia  . Chronic Disease Management support and education needs related to Stage 3a chronic kidney disease, HTN, chronic systolic heart failure, Mild Cognitive Impairment, Atherosclerotic heart disease of native coronary artery with other forms of angina pectoris  Nurse Case Manager Clinical Goal(s):  Marland Kitchen Over the next 90 days, patient will work with the CCM RN and PCP to address  needs related to disease education and support to improve Self Health management of dementia  CCM RN CM Interventions:  10/22/19 call completed with patient's son Sara Fox . Inter-disciplinary care team collaboration (see longitudinal plan of care) . Evaluation of current treatment plan related to dementia and patient's adherence to plan as established by Sara Fox. . Provided education to patient re: disease process for dementia and what to expect with disease progression  . Reviewed medications with patient and discussed son has noticed much improvement in patient's memory recall and behavior since starting Donepezil 5 mg q hs   . Discussed plans with patient for ongoing care management follow up and provided patient with direct contact information for care management team . Provided patient with printed educational materials related to Dementia  Patient Self Care Activities:  . Attends all scheduled Sara Fox appointments . Calls Sara Fox office for new concerns or questions . Supportive family to assist with patient care needs  Initial goal documentation     . COMPLETED: Assist with Chronic Care Management and Care Coordination needs       CARE PLAN ENTRY (see longtitudinal plan of care for additional care plan information)   Current Barriers:  . Chronic Disease Management support, education, and care coordination needs related to CHF, HTN, and CKD Stage III  Case Manager Clinical Goal(s):  Marland Kitchen Over the next 90 days, patient will work with the CCM team to address needs related to Chronic Care Management and Care Coordination needs in patient with CHF, HTN, and CKD Stage III  Interventions:  . Collaborated with Sara Fox to initiate plan of care to address needs related to Lacks knowledge of community resource:    in patient with CHF, HTN, and CKD Stage III  Patient Self Care Activities:  . Attends all scheduled Sara Fox appointments . Calls Sara Fox office for new concerns or questions .  Supportive family to assist with patient care needs  Initial goal documentation      Plan:   Telephone follow up appointment with care management team member scheduled for: 12/18/19   Delsa Sale, RN, BSN, CCM Care Management Coordinator [facility]  Direct Phone: [phone]

## 2019-10-23 NOTE — Patient Instructions (Signed)
Visit Information  Goals Addressed            This Visit's Progress   . "to keep her dementia under good control"       Son stated CARE PLAN ENTRY (see longitudinal plan of care for additional care plan information)  Current Barriers:  Marland Kitchen Knowledge Deficits related to disease process and Self Health management of dementia  . Chronic Disease Management support and education needs related to Stage 3a chronic kidney disease, HTN, chronic systolic heart failure, Mild Cognitive Impairment, Atherosclerotic heart disease of native coronary artery with other forms of angina pectoris  Nurse Case Manager Clinical Goal(s):  Marland Kitchen Over the next 90 days, patient will work with the [place] and PCP to address needs related to disease education and support to improve Self Health management of dementia  CCM RN CM Interventions:  10/22/19 call completed with patient's son Sara Fox . Inter-disciplinary care team collaboration (see longitudinal plan of care) . Evaluation of current treatment plan related to dementia and patient's adherence to plan as established by provider. . Provided education to patient re: disease process for dementia and what to expect with disease progression  . Reviewed medications with patient and discussed son has noticed much improvement in patient's memory recall and behavior since starting Donepezil 5 mg q hs   . Discussed plans with patient for ongoing care management follow up and provided patient with direct contact information for care management team . Provided patient with printed educational materials related to Dementia  Patient Self Care Activities:  . Attends all scheduled provider appointments . Calls provider office for new concerns or questions . Supportive family to assist with patient care needs  Initial goal documentation     . COMPLETED: Assist with Chronic Care Management and Care Coordination needs       CARE PLAN ENTRY (see longtitudinal plan of care for  additional care plan information)   Current Barriers:  . Chronic Disease Management support, education, and care coordination needs related to CHF, HTN, and CKD Stage III  Case Manager Clinical Goal(s):  Marland Kitchen Over the next 90 days, patient will work with the CCM team to address needs related to Chronic Care Management and Care Coordination needs in patient with CHF, HTN, and CKD Stage III  Interventions:  . Collaborated with BSW to initiate plan of care to address needs related to Lacks knowledge of community resource:    in patient with CHF, HTN, and CKD Stage III  Patient Self Care Activities:  . Attends all scheduled provider appointments . Calls provider office for new concerns or questions . Supportive family to assist with patient care needs  Initial goal documentation        Patient verbalizes understanding of instructions provided today.   Telephone follow up appointment with care management team member scheduled for: 12/18/19  Barb Merino, RN, BSN, CCM Care Management Coordinator [place] [facility]  Direct Phone: [phone]

## 2019-10-28 ENCOUNTER — Other Ambulatory Visit: Payer: Self-pay | Admitting: Internal Medicine

## 2019-10-28 ENCOUNTER — Other Ambulatory Visit: Payer: Self-pay | Admitting: Registered Nurse

## 2019-10-31 ENCOUNTER — Telehealth: Payer: Self-pay

## 2019-10-31 ENCOUNTER — Ambulatory Visit: Payer: Self-pay

## 2019-10-31 DIAGNOSIS — G3184 Mild cognitive impairment, so stated: Secondary | ICD-10-CM

## 2019-10-31 DIAGNOSIS — N1831 Chronic kidney disease, stage 3a: Secondary | ICD-10-CM

## 2019-10-31 NOTE — Chronic Care Management (AMB) (Signed)
  Chronic Care Management   Outreach Note  10/31/2019 Name: Sara Fox MRN: [id number] DOB: 02-20-1941  Referred by: [name], FNP Reason for referral : Care Coordination   SW placed an unsuccessful outbound call to the patient to follow up on goal progression. SW left a HIPAA compliant voice message requesting a return call.  Follow Up Plan: The care management team will reach out to the patient again over the next 14 days.   [name], BSW, CDP Social Worker, Certified Dementia Practitioner [place] [facility] [place] [facility] [phone] .

## 2019-11-06 ENCOUNTER — Encounter: Payer: Self-pay | Admitting: Nurse Practitioner

## 2019-11-06 ENCOUNTER — Ambulatory Visit (INDEPENDENT_AMBULATORY_CARE_PROVIDER_SITE_OTHER): Payer: Medicare Other | Admitting: Nurse Practitioner

## 2019-11-06 ENCOUNTER — Other Ambulatory Visit: Payer: Self-pay

## 2019-11-06 VITALS — BP 112/74 | HR 64 | Temp 98.0°F | Ht 64.4 in | Wt 128.0 lb

## 2019-11-06 DIAGNOSIS — G3184 Mild cognitive impairment, so stated: Secondary | ICD-10-CM

## 2019-11-06 DIAGNOSIS — I10 Essential (primary) hypertension: Secondary | ICD-10-CM

## 2019-11-06 DIAGNOSIS — G4700 Insomnia, unspecified: Secondary | ICD-10-CM

## 2019-11-06 MED ORDER — MAGNESIUM 250 MG PO TABS: ORAL_TABLET | ORAL | 1 refills | Status: DC

## 2019-11-06 NOTE — Progress Notes (Signed)
This visit occurred during the SARS-CoV-2 public health emergency.  Safety protocols were in place, including screening questions prior to the visit, additional usage of staff PPE, and extensive cleaning of exam room while observing appropriate contact time as indicated for disinfecting solutions.  Subjective:     Patient ID: Sara Fox , female    DOB: [birth year] , 79 y.o.   MRN: [id number]   Chief Complaint  Patient presents with  . Hypertension  . Memory Loss    HPI  She is here today with her daughter [name].  She would like to travel to [place] for the 4th of July.  She has not had any falls in the last 3 months.  Her son is more than likely going to drive straight through.      Past Medical History:  Diagnosis Date  . Acute kidney injury ([place])   . Back pain   . Cervical cancer ([place])   . CHF (congestive heart failure) ([place])   . COPD (chronic obstructive pulmonary disease) ([place])   . Coronary artery disease    s/p DES x2 to RCA 2016  . GERD (gastroesophageal reflux disease)   . Hyperlipidemia   . Hypertension   . Myocardial infarction ([place])   . Neck pain   . Rhabdomyolysis   . Stomach problems      Family History  Problem Relation Age of Onset  . Heart disease Mother        also HTN  . Diabetes Mother   . Colon cancer Mother   . Heart disease Brother        deceased at 3  . Hypertension Brother   . Heart attack Brother   . Heart disease Brother   . Hypertension Brother      Current Outpatient Medications:  .  aspirin EC 81 MG tablet, Take 81 mg by mouth daily., Disp: , Rfl:  .  budesonide-formoterol (SYMBICORT) 160-4.5 MCG/ACT inhaler, Inhale 2 puffs into the lungs 2 (two) times daily., Disp: 3 Inhaler, Rfl: 1 .  CALCIUM CITRATE PO, Take 1 tablet by mouth daily. , Disp: , Rfl:  .  Cholecalciferol (VITAMIN D3) 2000 UNITS capsule, Take 2,000 Units by mouth daily., Disp: , Rfl:  .  ciclopirox (PENLAC) 8 % solution, Apply topically at bedtime. Apply over  nail and surrounding skin. Apply daily over previous coat. After seven (7) days, file nail and continue cycle., Disp: 6.6 mL, Rfl: 0 .  donepezil (ARICEPT) 5 MG tablet, TAKE 1 TABLET BY MOUTH EVERYDAY AT BEDTIME, Disp: 90 tablet, Rfl: 1 .  gabapentin (NEURONTIN) 100 MG capsule, TAKE 1 CAPSULE BY MOUTH THREE TIMES DAILY, Disp: 270 capsule, Rfl: 0 .  Magnesium 250 MG TABS, Take 1 tablet by mouth with evening meal daily, Disp: 30 tablet, Rfl: 3 .  metoprolol tartrate (LOPRESSOR) 25 MG tablet, Take 1 tablet (25 mg total) by mouth 2 (two) times daily., Disp: 180 tablet, Rfl: 0 .  Multiple Vitamin (MULTIVITAMIN) capsule, Take 1 capsule by mouth daily. Contains iron, Disp: , Rfl:  .  nitroGLYCERIN (NITROSTAT) 0.4 MG SL tablet, PLACE ONE TABLET UNDER THE TONGUE EVERY 5 MINUTES AS NEEDED FOR CHEST PAIN., Disp: 50 tablet, Rfl: 0 .  oxybutynin (DITROPAN) 5 MG tablet, TAKE 1 TABLET BY MOUTH TWICE A DAY, Disp: 180 tablet, Rfl: 1 .  pantoprazole (PROTONIX) 40 MG tablet, Take 1 tablet (40 mg total) by mouth 2 (two) times daily., Disp: 180 tablet, Rfl: 1 .  polyethylene glycol (MIRALAX /  GLYCOLAX) packet, Take 17 g by mouth daily as needed for mild constipation. , Disp: , Rfl:  .  Potassium Chloride ER 20 MEQ TBCR, TAKE 2 TABLETS BY MOUTH IN THE MORNING, Disp: 60 tablet, Rfl: 3 .  rosuvastatin (CRESTOR) 40 MG tablet, Take 1 tablet (40 mg total) by mouth daily., Disp: 30 tablet, Rfl: 8 .  traMADol (ULTRAM) 50 MG tablet, TAKE 1-2 TABLETS THREE TIMES DAILY., Disp: 140 tablet, Rfl: 1 .  vitamin C (ASCORBIC ACID) 500 MG tablet, Take 500 mg by mouth daily., Disp: , Rfl:  .  furosemide (LASIX) 20 MG tablet, TAKE 1 TABLET BY MOUTH EVERY OTHER DAY (Patient not taking: Reported on 11/05/2019), Disp: 45 tablet, Rfl: 1   Allergies  Allergen Reactions  . Buprenorphine Hcl Shortness Of Breath    Throat swelling/trouble breathing and lethargic  . Morphine And Related Shortness Of Breath    Throat swelling/trouble breathing and  lethargic  . Celebrex [Celecoxib] Diarrhea and Swelling    Swelling of legs   . Vioxx [Rofecoxib] Palpitations  . Codeine Other (See Comments)    Bloated      Review of Systems  Constitutional: Negative.   Respiratory: Negative.  Negative for cough.   Cardiovascular: Negative.  Negative for chest pain, palpitations and leg swelling.  Neurological: Negative for dizziness and headaches.  Psychiatric/Behavioral: Negative.      Today's Vitals   11/06/19 0909  BP: 112/74  Pulse: 64  Temp: 98 F (36.7 C)  TempSrc: Oral  Weight: 128 lb (58.1 kg)  Height: 5' 4.4" (1.636 m)  PainSc: 0-No pain   Body mass index is 21.7 kg/m.   Objective:  Physical Exam Vitals reviewed.  Constitutional:      Appearance: She is well-developed.  HENT:     Head: Normocephalic and atraumatic.  Eyes:     Pupils: Pupils are equal, round, and reactive to light.  Cardiovascular:     Rate and Rhythm: Normal rate and regular rhythm.     Pulses: Normal pulses.     Heart sounds: Normal heart sounds. No murmur heard.   Pulmonary:     Effort: Pulmonary effort is normal.     Breath sounds: Normal breath sounds.  Musculoskeletal:        General: Normal range of motion.  Skin:    General: Skin is warm and dry.     Capillary Refill: Capillary refill takes less than 2 seconds.  Neurological:     General: No focal deficit present.     Mental Status: She is alert and oriented to person, place, and time.     Cranial Nerves: No cranial nerve deficit.  Psychiatric:        Mood and Affect: Mood normal.         Assessment And Plan:     1. Mild cognitive impairment with memory loss  Overall doing well  - Magnesium 250 MG TABS; Take 1 tablet by mouth with evening meal daily  Dispense: 90 tablet; Refill: 1  2. Essential hypertension  Chronic, well controlled  3. Insomnia, unspecified type  Chronic, stable - Magnesium 250 MG TABS; Take 1 tablet by mouth with evening meal daily  Dispense: 90 tablet;  Refill: 1    discussed with her daughter at this time the patient is stable to travel.  Encouraged to stop frequently and to monitor for swelling to lower extremities. Recommend to wear support socks for a few hours during travel. Also recommended the family comes to her  to limit her ride time.     [name], FNP    THE PATIENT IS ENCOURAGED TO PRACTICE SOCIAL DISTANCING DUE TO THE COVID-19 PANDEMIC.

## 2019-11-06 NOTE — Patient Instructions (Signed)
   Be sure to stop frequently at least every 2 hours during travel  Wear supports at least 4 hours during drive.   Stay well hydrated and go to restroom at least every 2 hours.

## 2019-11-12 ENCOUNTER — Ambulatory Visit: Payer: Self-pay

## 2019-11-12 DIAGNOSIS — I25118 Atherosclerotic heart disease of native coronary artery with other forms of angina pectoris: Secondary | ICD-10-CM

## 2019-11-12 DIAGNOSIS — I5022 Chronic systolic (congestive) heart failure: Secondary | ICD-10-CM | POA: Diagnosis not present

## 2019-11-12 DIAGNOSIS — N1831 Chronic kidney disease, stage 3a: Secondary | ICD-10-CM

## 2019-11-12 DIAGNOSIS — I10 Essential (primary) hypertension: Secondary | ICD-10-CM | POA: Diagnosis not present

## 2019-11-12 DIAGNOSIS — G3184 Mild cognitive impairment, so stated: Secondary | ICD-10-CM

## 2019-11-12 NOTE — Chronic Care Management (AMB) (Addendum)
Chronic Care Management    Social Work Follow Up Note  11/12/2019 Name: Sara Fox MRN: [id number] DOB: 1940-08-26  Sara Fox is a 79 y.o. year old female who is a primary care patient of [name], [place]. The CCM team was consulted for assistance with care coordination.   Review of patient status, including review of consultants reports, other relevant assessments, and collaboration with appropriate care team members and the patient's provider was performed as part of comprehensive patient evaluation and provision of chronic care management services.    SDOH (Social Determinants of Health) assessments performed: No    Outpatient Encounter Medications as of 11/12/2019  Medication Sig  . aspirin EC 81 MG tablet Take 81 mg by mouth daily.  . budesonide-formoterol (SYMBICORT) 160-4.5 MCG/ACT inhaler Inhale 2 puffs into the lungs 2 (two) times daily.  Marland Kitchen CALCIUM CITRATE PO Take 1 tablet by mouth daily.   . Cholecalciferol (VITAMIN D3) 2000 UNITS capsule Take 2,000 Units by mouth daily.  . ciclopirox (PENLAC) 8 % solution Apply topically at bedtime. Apply over nail and surrounding skin. Apply daily over previous coat. After seven (7) days, file nail and continue cycle.  . donepezil (ARICEPT) 5 MG tablet TAKE 1 TABLET BY MOUTH EVERYDAY AT BEDTIME  . furosemide (LASIX) 20 MG tablet TAKE 1 TABLET BY MOUTH EVERY OTHER DAY (Patient not taking: Reported on 11/05/2019)  . gabapentin (NEURONTIN) 100 MG capsule TAKE 1 CAPSULE BY MOUTH THREE TIMES DAILY  . Magnesium 250 MG TABS Take 1 tablet by mouth with evening meal daily  . metoprolol tartrate (LOPRESSOR) 25 MG tablet Take 1 tablet (25 mg total) by mouth 2 (two) times daily.  . Multiple Vitamin (MULTIVITAMIN) capsule Take 1 capsule by mouth daily. Contains iron  . nitroGLYCERIN (NITROSTAT) 0.4 MG SL tablet PLACE ONE TABLET UNDER THE TONGUE EVERY 5 MINUTES AS NEEDED FOR CHEST PAIN.  Marland Kitchen oxybutynin (DITROPAN) 5 MG tablet TAKE 1 TABLET BY MOUTH  TWICE A DAY  . pantoprazole (PROTONIX) 40 MG tablet Take 1 tablet (40 mg total) by mouth 2 (two) times daily.  . polyethylene glycol (MIRALAX / GLYCOLAX) packet Take 17 g by mouth daily as needed for mild constipation.   . Potassium Chloride ER 20 MEQ TBCR TAKE 2 TABLETS BY MOUTH IN THE MORNING  . rosuvastatin (CRESTOR) 40 MG tablet Take 1 tablet (40 mg total) by mouth daily.  . traMADol (ULTRAM) 50 MG tablet TAKE 1-2 TABLETS THREE TIMES DAILY.  . vitamin C (ASCORBIC ACID) 500 MG tablet Take 500 mg by mouth daily.   No facility-administered encounter medications on file as of 11/12/2019.    Goals Addressed              This Visit's Progress     Patient Stated   .  We want my mom to have a life alert (pt-stated)   On track     Daughter stated  Sara Fox (see longitudinal Fox of care for additional care Fox information)  Current Barriers:  . Limited access to caregiver . Lacks knowledge of health Fox benefits . Chronic conditions including HTN, CHF, CKD III, and cognitive impairment which lead to increase risk for falls  Social Work Clinical Goal(s):  Marland Kitchen Over the next 45 days the patient and her family will work with SW to obtain a personal emergency response system (PERS) under the patients health Fox benefit  CCM SW Interventions: Completed 11/12/19 with Tilda Burrow . Inter-disciplinary care team collaboration (see longitudinal  Fox of care) . Collaboration with patient primary provider requesting SW outreach to the patient daughter, [name], to assist with obtaining a personal emergency response system (PERS) . Performed chart review to note current coverage is provided by [place] [facility] . Successful outbound call placed to the patients daughter, Tilda Burrow to assist with resource needs . Educated Sara Fox on the patients health Fox benefits  . Assisted Sara Fox in contacting [employer] whom is contracted to provide patient services under [employer]  1 . Successfully ordered patient a home personal emergency response system o Patient is eligible to receive a mobile PERS which would work outside of the home but is not safe for persons with pacemakers; [name] reports she is unsure if the patient has a pacemaker o SW collaboration with patient primary care provider requesting feedback indicating status of pacemaker o Informed by [employer], Clifton James, if the patient does not have a pacemaker SW can contact again to switch device to a mobile one . Scheduled follow up call over the next month to confirm receipt of device 4:15 pm . Collaboration with patient primary provider [name], FNP indicating patient does not have a pacemaker . Successful outbound call placed to [facility] to change patient PERS order to the mobile device which will work both in the home and out of the home . SW placed successful outbound call to Tilda Burrow to inform of change from home to mobile PERS device  Patient Self Care Activities:  . Attends all scheduled provider appointments . Calls pharmacy for medication refills . Calls provider office for new concerns or questions . Supportive family to assist with patient care needs  Initial goal documentation       Other   .  Collaborate with RN Case Manager to assist with care coordination needs   On track     [place] (see longtitudinal Fox of care for additional care Fox information)  Current Barriers:  . Social Isolation . Memory Deficits . HTN, CHF, CKD III, mild cognitive impairment  Social Work Clinical Goal(s):  Marland Kitchen Over the next 90 days, patient will work with care management team to address needs related to care coordination needs and caregiver asistance.  CCM SW Interventions: Completed 11/12/19 . SW collaboration with [name] with [facility] requesting follow up on outcome of patient enrollment 4:30pm . Inbound call received from [name]  stating patient was approved for [facility] enrollment with a $90 monthly co-pay o Sara Fox spoke with the patient about this but has yet to receive contact with patient decision regarding enrollment . Successful outbound call placed to the patients daughter, [name] to discuss [facility] program and patient co-pay amount o [name] will touch base with patient and brother regarding family decision of enrollment  Patient Self Care Activities:  . Attends all scheduled provider appointments . Calls provider office for new concerns or questions . Supportive family to assist with patient care needs  Please see past updates related to this goal by clicking on the "Past Updates" button in the selected goal           Follow Up Fox: SW will follow up with patient by phone over the next month.   [name], BSW, CDP Social Worker, Certified Dementia Practitioner [place] [facility] [place] [facility] [phone]  Total time spent performing care coordination and/or care management activities with the patient by phone or face to face =  58 minutes.

## 2019-11-12 NOTE — Patient Instructions (Signed)
Social Worker Visit Information  Goals we discussed today:  Goals Addressed              This Visit's Progress     Patient Stated   .  We want my mom to have a life alert (pt-stated)        Daughter stated  CARE PLAN ENTRY (see longitudinal plan of care for additional care plan information)  Current Barriers:  . Limited access to caregiver . Lacks knowledge of health plan benefits . Chronic conditions including HTN, CHF, CKD III, and cognitive impairment which lead to increase risk for falls  Social Work Clinical Goal(s):  Marland Kitchen Over the next 45 days the patient and her family will work with SW to obtain a personal emergency response system (PERS) under the patients health plan benefit  CCM SW Interventions: Completed 11/12/19 with Tilda Burrow . Inter-disciplinary care team collaboration (see longitudinal plan of care) . Collaboration with patient primary provider requesting SW outreach to the patient daughter, [name], to assist with obtaining a personal emergency response system (PERS) . Performed chart review to note current coverage is provided by [employer] . Successful outbound call placed to the patients daughter, Tilda Burrow to assist with resource needs . Educated [name] on the patients health plan benefits  . Assisted [name] in contacting [employer] whom is contracted to provide patient services under [employer] 1 . Successfully ordered patient a home personal emergency response system o Patient is eligible to receive a mobile PERS which would work outside of the home but is not safe for persons with pacemakers; [name] reports she is unsure if the patient has a pacemaker o SW collaboration with patient primary care provider requesting feedback indicating status of pacemaker o Informed by [employer], Clifton James, if the patient does not have a pacemaker SW can contact again to switch device to a mobile one . Scheduled follow up call  over the next month to confirm receipt of device  Patient Self Care Activities:  . Attends all scheduled provider appointments . Calls pharmacy for medication refills . Calls provider office for new concerns or questions . Supportive family to assist with patient care needs  Initial goal documentation       Other   .  Collaborate with RN Case Manager to assist with care coordination needs        CARE PLAN ENTRY (see longtitudinal plan of care for additional care plan information)  Current Barriers:  . Social Isolation . Memory Deficits . HTN, CHF, CKD III, mild cognitive impairment  Social Work Clinical Goal(s):  Marland Kitchen Over the next 90 days, patient will work with care management team to address needs related to care coordination needs and caregiver asistance.  CCM SW Interventions: Completed 11/12/19 . SW collaboration with [name] with [facility] requesting follow up on outcome of patient enrollment  Patient Self Care Activities:  . Attends all scheduled provider appointments . Calls provider office for new concerns or questions . Supportive family to assist with patient care needs  Please see past updates related to this goal by clicking on the "Past Updates" button in the selected goal          Materials Provided: Verbal education about health plan benefits provided by phone  Follow Up Plan: SW will follow up with patient by phone over the next month.   [name], BSW, CDP Social Worker, Transport planner [facility]  Care Management [phone]

## 2019-12-08 ENCOUNTER — Ambulatory Visit: Payer: Medicare Other | Admitting: Internal Medicine

## 2019-12-08 ENCOUNTER — Other Ambulatory Visit: Payer: Self-pay

## 2019-12-08 ENCOUNTER — Encounter: Payer: Self-pay | Admitting: Internal Medicine

## 2019-12-08 VITALS — BP 113/71 | HR 65 | Ht 64.0 in | Wt 133.4 lb

## 2019-12-08 DIAGNOSIS — I2510 Atherosclerotic heart disease of native coronary artery without angina pectoris: Secondary | ICD-10-CM | POA: Diagnosis not present

## 2019-12-08 DIAGNOSIS — I10 Essential (primary) hypertension: Secondary | ICD-10-CM | POA: Diagnosis not present

## 2019-12-08 DIAGNOSIS — E785 Hyperlipidemia, unspecified: Secondary | ICD-10-CM | POA: Diagnosis not present

## 2019-12-08 MED ORDER — POTASSIUM CHLORIDE ER 20 MEQ PO TBCR: 2.0000 | EXTENDED_RELEASE_TABLET | Freq: Every morning | ORAL | 11 refills | Status: DC

## 2019-12-08 NOTE — Patient Instructions (Signed)

## 2019-12-08 NOTE — Progress Notes (Signed)
OFFICE NOTE  Chief Complaint:  No complaints  Primary Care Physician: [name], FNP  HPI:  Sara Fox is a 79 year old female with history of multiple surgeries in the past including knee replacement, shoulder surgery, subtotal gastrectomy, and recently she underwent back surgery. She has actually done very well with all these surgeries; has had no cardiac complications, MIs, or any other significant issues. She did have an echocardiogram which showed an EF of 55% recently with mild diastolic dysfunction. There are family risk factors for coronary disease, but she is on appropriate medications including aspirin and a statin. Recently she underwent a Corus gene test in your office which was abnormal, demonstrating a score of 20, which indicates a 30% likelihood of obstructive coronary disease. She remains asymptomatic from a cardiac standpoint and I did not feel additional work-up was necessary.  Her main concern today is caring for her husband who has been pretty sick. She reports her blood pressures been under good control. She is currently taking lovastatin and her cholesterol is at goal.  She does report a trace amount of lower extremity edema from time to time.  I saw Sara Fox back today. On fortunately she presented to the hospital with chest pain and was found to have non-STEMI. She underwent cardiac catheterization by Dr. Ellyn Hack in had a severe mid to distal RCA stenosis. She had a placement of a long 33 mm drug-eluting stent which was designed to [place]. She tolerated this well his had no further chest pain. She is currently on aspirin and Brilinta. She reports taking her medications regularly. She does feel that she gets a little bit dry mouth and may be a little dehydrated. She is on Lasix 20 mg daily but was found to have a mildly reduced EF about 45%.  04/12/2016  Sara Fox returns today for follow-up. Overall she is feeling very well. She denies any chest pain or  worsening shortness of breath. She's had about 11 pound weight loss and has been working with physical medicine and rehabilitation for ongoing treatment of chronic pain. She denies any worsening chest pain or shortness of breath. Her stent was placed last more than one year ago. She has been on aspirin and Brilinta. I decreased her Lasix at her last office visit and increased her lisinopril. Blood pressure is fairly well-controlled.  12/04/2017  Sara Fox returns today for follow-up.  She reports 2 episodes of chest pain last month.  In June she underwent removal of some hardware from her left wrist.  She had been doing well but then developed left-sided chest discomfort.  Interestingly she still on Brilinta after PCI to the right coronary artery in 2016.  Apparently according to her son, she developed swelling with coming off of Brilinta and was restarted on the swelling disappeared.  I am never heard of this and I am concerned about long-term use of Brilinta regarding bleeding risk.  With regards to the chest pain the 2 episodes were slightly different.  The first episode was at rest and relieved after 2 nitroglycerin.  Second episode was associated with some indigestion and improved after belching.  She is noted to have residual 50% LAD stenosis on cath in 2016.  03/31/2019  Sara Fox is seen today in follow-up.  I spoke with her son via telephone during the visit due to office restrictions for visitation.  He reports that she has had a couple episodes of chest discomfort which came on after raking leaves.  She stopped because it did not immediately resolve she took a nitro.  Ultimately the pain did resolve.  She also had one episode of extreme fatigue for a day where she basically slept all day.  Is not clear what this was related to.  She has had some intermittent UTIs.  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  At her most recent virtual visit, it was noted that she was off of her statins.  Her  cholesterol is much higher.  This is been restarted and her most recent lipids were improved.  Total cholesterol now 144, triglycerides 109, HDL 43 and LDL 76.  12/08/2019  Sara Fox is seen today for annual follow-up.  She denies any chest pain symptoms.  Over the past 6 months she has not required any nitro.  Blood pressures well controlled today.  She is due to see her PCP with repeat labs in a few days.  EKG is sinus rhythm without any ischemia.  PMHx:  Past Medical History:  Diagnosis Date  . Acute kidney injury ([place])   . Back pain   . Cervical cancer ([place])   . CHF (congestive heart failure) ([place])   . COPD (chronic obstructive pulmonary disease) ([name])   . Coronary artery disease    s/p DES x2 to RCA 2016  . GERD (gastroesophageal reflux disease)   . Hyperlipidemia   . Hypertension   . Myocardial infarction ([place])   . Neck pain   . Rhabdomyolysis   . Stomach problems     Past Surgical History:  Procedure Laterality Date  . ABDOMINAL HYSTERECTOMY    . BACK SURGERY  2012   lower back  . CARDIAC CATHETERIZATION  06/1999   noncritical disease invovling PDA  . CARDIAC CATHETERIZATION N/A 09/14/2014   Procedure: Left Heart Cath and Coronary Angiography;  Surgeon: Leonie Man, MD;  Location: [facility] INVASIVE CV LAB CUPID;  Service: Cardiovascular;  Laterality: N/A;  . CHOLECYSTECTOMY    . ESOPHAGOGASTRODUODENOSCOPY (EGD) WITH PROPOFOL Left 07/19/2017   Procedure: ESOPHAGOGASTRODUODENOSCOPY (EGD) WITH PROPOFOL;  Surgeon: Ronnette Juniper, MD;  Location: [facility] ENDOSCOPY;  Service: Gastroenterology;  Laterality: Left;  . FRACTURE SURGERY Left 05/2017   left wrist  . HARDWARE REMOVAL Left 11/07/2017   Procedure: LEFT WRIST HARDWARE REMOVAL;  Surgeon: Charlotte Crumb, MD;  Location: [facility];  Service: Orthopedics;  Laterality: Left;  . KNEE SURGERY Bilateral 2001 & 2007  . NECK SURGERY  2012   2012  . PARTIAL GASTRECTOMY  2005   subtotal  . PERCUTANEOUS CORONARY STENT INTERVENTION (PCI-S)   09/14/2014   Procedure: Percutaneous Coronary Stent Intervention (Pci-S);  Surgeon: Leonie Man, MD;  Location: [facility] INVASIVE CV LAB CUPID;  Service: Cardiovascular;;  . SHOULDER SURGERY Right   . TRANSTHORACIC ECHOCARDIOGRAM  06/02/2010   EF=>55%, normal LV systolic function; normal RV systolic function; mild mitral annular calcif; trace TR; AV mildly sclerotic  . WRIST OSTEOTOMY Left 11/07/2017   Procedure: LEFT WRIST DISTAL ULNA RESECTION WITH EXTENSOR CARPI ULNARIS STABILIZATION;  Surgeon: Charlotte Crumb, MD;  Location: [facility];  Service: Orthopedics;  Laterality: Left;    FAMHx:  Family History  Problem Relation Age of Onset  . Heart disease Mother        also HTN  . Diabetes Mother   . Colon cancer Mother   . Heart disease Brother        deceased at 38  . Hypertension Brother   . Heart attack Brother   . Heart disease Brother   .  Hypertension Brother     SOCHx:   reports that she has been smoking. She has a 30.00 pack-year smoking history. She has never used smokeless tobacco. She reports that she does not drink alcohol and does not use drugs.  ALLERGIES:  Allergies  Allergen Reactions  . Buprenorphine Hcl Shortness Of Breath    Throat swelling/trouble breathing and lethargic  . Morphine And Related Shortness Of Breath    Throat swelling/trouble breathing and lethargic  . Celebrex [Celecoxib] Diarrhea and Swelling    Swelling of legs   . Vioxx [Rofecoxib] Palpitations  . Codeine Other (See Comments)    Bloated     ROS: Pertinent items noted in HPI and remainder of comprehensive ROS otherwise negative.  HOME MEDS: Current Outpatient Medications  Medication Sig Dispense Refill  . aspirin EC 81 MG tablet Take 81 mg by mouth daily.    . budesonide-formoterol (SYMBICORT) 160-4.5 MCG/ACT inhaler Inhale 2 puffs into the lungs 2 (two) times daily. 3 Inhaler 1  . CALCIUM CITRATE PO Take 1 tablet by mouth daily.     . Cholecalciferol (VITAMIN D3) 2000 UNITS capsule Take  2,000 Units by mouth daily.    . ciclopirox (PENLAC) 8 % solution Apply topically at bedtime. Apply over nail and surrounding skin. Apply daily over previous coat. After seven (7) days, file nail and continue cycle. 6.6 mL 0  . donepezil (ARICEPT) 5 MG tablet TAKE 1 TABLET BY MOUTH EVERYDAY AT BEDTIME 90 tablet 1  . furosemide (LASIX) 20 MG tablet TAKE 1 TABLET BY MOUTH EVERY OTHER DAY 45 tablet 1  . gabapentin (NEURONTIN) 100 MG capsule TAKE 1 CAPSULE BY MOUTH THREE TIMES DAILY 270 capsule 0  . Magnesium 250 MG TABS Take 1 tablet by mouth with evening meal daily 90 tablet 1  . metoprolol tartrate (LOPRESSOR) 25 MG tablet Take 1 tablet (25 mg total) by mouth 2 (two) times daily. 180 tablet 0  . Multiple Vitamin (MULTIVITAMIN) capsule Take 1 capsule by mouth daily. Contains iron    . nitroGLYCERIN (NITROSTAT) 0.4 MG SL tablet PLACE ONE TABLET UNDER THE TONGUE EVERY 5 MINUTES AS NEEDED FOR CHEST PAIN. 50 tablet 0  . oxybutynin (DITROPAN) 5 MG tablet TAKE 1 TABLET BY MOUTH TWICE A DAY 180 tablet 1  . pantoprazole (PROTONIX) 40 MG tablet Take 1 tablet (40 mg total) by mouth 2 (two) times daily. 180 tablet 1  . polyethylene glycol (MIRALAX / GLYCOLAX) packet Take 17 g by mouth daily as needed for mild constipation.     . Potassium Chloride ER 20 MEQ TBCR TAKE 2 TABLETS BY MOUTH IN THE MORNING 60 tablet 3  . rosuvastatin (CRESTOR) 40 MG tablet Take 1 tablet (40 mg total) by mouth daily. 30 tablet 8  . traMADol (ULTRAM) 50 MG tablet TAKE 1-2 TABLETS THREE TIMES DAILY. 140 tablet 1  . vitamin C (ASCORBIC ACID) 500 MG tablet Take 500 mg by mouth daily.    . CVS MAGNESIUM OXIDE 250 MG TABS SMARTSIG:1 Tablet(s) By Mouth Every Evening     No current facility-administered medications for this visit.    LABS/IMAGING: No results found for this or any previous visit (from the past 48 hour(s)). No results found.  VITALS: BP 113/71   Pulse 65   Ht 5\' 4"  (1.626 m)   Wt 133 lb 6.4 oz (60.5 kg)   SpO2 100%    BMI 22.90 kg/m   EXAM: General appearance: alert and no distress Neck: no JVD Lungs: clear to  auscultation bilaterally Heart: regular rate and rhythm, S1, S2 normal and systolic murmur: early systolic 2/6, crescendo at 2nd right intercostal space Abdomen: soft, non-tender; bowel sounds normal; no masses,  no organomegaly Extremities: edema trace bilateral Pulses: 2+ and symmetric Skin: Skin color, texture, turgor normal. No rashes or lesions Neurologic: Grossly normal Psych: Seems somewhat pre-occupied  EKG: Normal sinus rhythm at 64-personally reviewed  ASSESSMENT: 1. Coronary artery disease status post PCI to the RCA with a 3.0 x 33 mm Xience [place] drug-eluting stent (09/2014) 2. Hypertension 3. Dyslipidemia  PLAN: 1.   Sara Fox seems to be doing well without any recurrent chest pain symptoms.  Is now 5 years since her drug-eluting stent.  Her cholesterol have been well controlled last year with LDL less than 70.  She has upcoming labs in a few days with her PCP.  I will look out for those.  Blood pressure is well controlled.  No changes to her medicines today.  Follow-up with me annually or sooner as necessary.  Pixie Casino, MD, [facility], [place] Director of the Advanced Lipid Disorders &  Cardiovascular Risk Reduction Clinic Diplomate of the American Board of Clinical Lipidology Attending Cardiologist  Direct Dial: [phone]  Fax: [phone]  Website:  [email or web address] 12/08/2019, 9:07 AM

## 2019-12-10 ENCOUNTER — Telehealth: Payer: Self-pay

## 2019-12-10 NOTE — Telephone Encounter (Signed)
  Chronic Care Management   Outreach Note  12/10/2019 Name: Sara Fox MRN: [id number] DOB: [birth year]  Referred by: [name], FNP Reason for referral : Care Coordination   An unsuccessful telephone outreach to the patients daughter, Tilda Burrow, was attempted today to follow up on goal progression. SW unable to leave a HIPAA compliant voice message due to Mrs. Beards voice mailbox being full. The patient was referred to the case management team for assistance with care management and care coordination.   Follow Up Plan: The care management team will reach out to the patient again over the next 14 days.   [name], BSW, CDP Social Worker, Certified Dementia Practitioner [place] / [place] Management [phone]

## 2019-12-11 ENCOUNTER — Other Ambulatory Visit: Payer: Self-pay

## 2019-12-11 ENCOUNTER — Ambulatory Visit (INDEPENDENT_AMBULATORY_CARE_PROVIDER_SITE_OTHER): Payer: Medicare Other

## 2019-12-11 ENCOUNTER — Ambulatory Visit: Payer: Medicare Other

## 2019-12-11 ENCOUNTER — Other Ambulatory Visit: Payer: Self-pay | Admitting: Internal Medicine

## 2019-12-11 ENCOUNTER — Ambulatory Visit (INDEPENDENT_AMBULATORY_CARE_PROVIDER_SITE_OTHER): Payer: Medicare Other | Admitting: Nurse Practitioner

## 2019-12-11 ENCOUNTER — Encounter: Payer: Self-pay | Admitting: Nurse Practitioner

## 2019-12-11 ENCOUNTER — Other Ambulatory Visit: Payer: Self-pay | Admitting: Nurse Practitioner

## 2019-12-11 VITALS — BP 130/74 | HR 60 | Temp 98.4°F | Ht 64.0 in | Wt 130.4 lb

## 2019-12-11 VITALS — BP 130/74 | HR 60 | Temp 98.4°F | Ht 64.4 in | Wt 130.4 lb

## 2019-12-11 DIAGNOSIS — I10 Essential (primary) hypertension: Secondary | ICD-10-CM | POA: Diagnosis not present

## 2019-12-11 DIAGNOSIS — I130 Hypertensive heart and chronic kidney disease with heart failure and stage 1 through stage 4 chronic kidney disease, or unspecified chronic kidney disease: Secondary | ICD-10-CM | POA: Diagnosis not present

## 2019-12-11 DIAGNOSIS — I25118 Atherosclerotic heart disease of native coronary artery with other forms of angina pectoris: Secondary | ICD-10-CM | POA: Diagnosis not present

## 2019-12-11 DIAGNOSIS — Z0000 Encounter for general adult medical examination without abnormal findings: Secondary | ICD-10-CM

## 2019-12-11 DIAGNOSIS — G3184 Mild cognitive impairment, so stated: Secondary | ICD-10-CM | POA: Diagnosis not present

## 2019-12-11 DIAGNOSIS — N1831 Chronic kidney disease, stage 3a: Secondary | ICD-10-CM

## 2019-12-11 LAB — CBC
Hematocrit: 35.7 % (ref 34.0–46.6)
Hemoglobin: 10.9 g/dL — ABNORMAL LOW (ref 11.1–15.9)
MCH: 29.9 pg (ref 26.6–33.0)
MCHC: 30.5 g/dL — ABNORMAL LOW (ref 31.5–35.7)
MCV: 98 fL — ABNORMAL HIGH (ref 79–97)
Platelets: 281 10*3/uL (ref 150–450)
RBC: 3.64 x10E6/uL — ABNORMAL LOW (ref 3.77–5.28)
RDW: 12.4 % (ref 11.7–15.4)
WBC: 5.3 10*3/uL (ref 3.4–10.8)

## 2019-12-11 LAB — CMP14+EGFR
ALT: 11 IU/L (ref 0–32)
AST: 13 IU/L (ref 0–40)
Albumin/Globulin Ratio: 1.4 (ref 1.2–2.2)
Albumin: 3.9 g/dL (ref 3.7–4.7)
Alkaline Phosphatase: 94 IU/L (ref 48–121)
BUN/Creatinine Ratio: 17 (ref 12–28)
BUN: 18 mg/dL (ref 8–27)
Bilirubin Total: 0.4 mg/dL (ref 0.0–1.2)
CO2: 20 mmol/L (ref 20–29)
Calcium: 9.6 mg/dL (ref 8.7–10.3)
Chloride: 109 mmol/L — ABNORMAL HIGH (ref 96–106)
Creatinine, Ser: 1.04 mg/dL — ABNORMAL HIGH (ref 0.57–1.00)
GFR calc Af Amer: 59 mL/min/{1.73_m2} — ABNORMAL LOW (ref >59)
GFR calc non Af Amer: 51 mL/min/{1.73_m2} — ABNORMAL LOW (ref >59)
Globulin, Total: 2.7 g/dL (ref 1.5–4.5)
Glucose: 94 mg/dL (ref 65–99)
Potassium: 4.5 mmol/L (ref 3.5–5.2)
Sodium: 142 mmol/L (ref 134–144)
Total Protein: 6.6 g/dL (ref 6.0–8.5)

## 2019-12-11 NOTE — Patient Instructions (Signed)
Sara Fox , Thank you for taking time to come for your Medicare Wellness Visit. I appreciate your ongoing commitment to your health goals. Please review the following plan we discussed and let me know if I can assist you in the future.   Screening recommendations/referrals: Colonoscopy: not required Mammogram: not required Bone Density: completed completed 08/11/2017 Recommended yearly ophthalmology/optometry visit for glaucoma screening and checkup Recommended yearly dental visit for hygiene and checkup  Vaccinations: Influenza vaccine: decline Pneumococcal vaccine: 08/30/2011 Tdap vaccine: completed 08/30/2011 Shingles vaccine: discussed   Covid-19:09/25/2019, 08/30/2019  Advanced directives: Please bring a copy of your POA (Power of Attorney) and/or Living Will to your next appointment.   Conditions/risks identified: smoking  Next appointment: Follow up in one year for your annual wellness visit 12/15/2020 at 10:30   Preventive Care 65 Years and Older, Female Preventive care refers to lifestyle choices and visits with your health care provider that can promote health and wellness. What does preventive care include?  A yearly physical exam. This is also called an annual well check.  Dental exams once or twice a year.  Routine eye exams. Ask your health care provider how often you should have your eyes checked.  Personal lifestyle choices, including:  Daily care of your teeth and gums.  Regular physical activity.  Eating a healthy diet.  Avoiding tobacco and drug use.  Limiting alcohol use.  Practicing safe sex.  Taking low-dose aspirin every day.  Taking vitamin and mineral supplements as recommended by your health care provider. What happens during an annual well check? The services and screenings done by your health care provider during your annual well check will depend on your age, overall health, lifestyle risk factors, and family history of disease. Counseling    Your health care provider may ask you questions about your:  Alcohol use.  Tobacco use.  Drug use.  Emotional well-being.  Home and relationship well-being.  Sexual activity.  Eating habits.  History of falls.  Memory and ability to understand (cognition).  Work and work Statistician.  Reproductive health. Screening  You may have the following tests or measurements:  Height, weight, and BMI.  Blood pressure.  Lipid and cholesterol levels. These may be checked every 5 years, or more frequently if you are over 96 years old.  Skin check.  Lung cancer screening. You may have this screening every year starting at age 30 if you have a 30-pack-year history of smoking and currently smoke or have quit within the past 15 years.  Fecal occult blood test (FOBT) of the stool. You may have this test every year starting at age 31.  Flexible sigmoidoscopy or colonoscopy. You may have a sigmoidoscopy every 5 years or a colonoscopy every 10 years starting at age 53.  Hepatitis C blood test.  Hepatitis B blood test.  Sexually transmitted disease (STD) testing.  Diabetes screening. This is done by checking your blood sugar (glucose) after you have not eaten for a while (fasting). You may have this done every 1-3 years.  Bone density scan. This is done to screen for osteoporosis. You may have this done starting at age 20.  Mammogram. This may be done every 1-2 years. Talk to your health care provider about how often you should have regular mammograms. Talk with your health care provider about your test results, treatment options, and if necessary, the need for more tests. Vaccines  Your health care provider may recommend certain vaccines, such as:  Influenza vaccine. This is recommended  every year.  Tetanus, diphtheria, and acellular pertussis (Tdap, Td) vaccine. You may need a Td booster every 10 years.  Zoster vaccine. You may need this after age 33.  Pneumococcal 13-valent  conjugate (PCV13) vaccine. One dose is recommended after age 73.  Pneumococcal polysaccharide (PPSV23) vaccine. One dose is recommended after age 70. Talk to your health care provider about which screenings and vaccines you need and how often you need them. This information is not intended to replace advice given to you by your health care provider. Make sure you discuss any questions you have with your health care provider. Document Released: 05/28/2015 Document Revised: 01/19/2016 Document Reviewed: 03/02/2015 Elsevier Interactive Patient Education  2017 [place] Prevention in the Home Falls can cause injuries. They can happen to people of all ages. There are many things you can do to make your home safe and to help prevent falls. What can I do on the outside of my home?  Regularly fix the edges of walkways and driveways and fix any cracks.  Remove anything that might make you trip as you walk through a door, such as a raised step or threshold.  Trim any bushes or trees on the path to your home.  Use bright outdoor lighting.  Clear any walking paths of anything that might make someone trip, such as rocks or tools.  Regularly check to see if handrails are loose or broken. Make sure that both sides of any steps have handrails.  Any raised decks and porches should have guardrails on the edges.  Have any leaves, snow, or ice cleared regularly.  Use sand or salt on walking paths during winter.  Clean up any spills in your garage right away. This includes oil or grease spills. What can I do in the bathroom?  Use night lights.  Install grab bars by the toilet and in the tub and shower. Do not use towel bars as grab bars.  Use non-skid mats or decals in the tub or shower.  If you need to sit down in the shower, use a plastic, non-slip stool.  Keep the floor dry. Clean up any water that spills on the floor as soon as it happens.  Remove soap buildup in the tub or  shower regularly.  Attach bath mats securely with double-sided non-slip rug tape.  Do not have throw rugs and other things on the floor that can make you trip. What can I do in the bedroom?  Use night lights.  Make sure that you have a light by your bed that is easy to reach.  Do not use any sheets or blankets that are too big for your bed. They should not hang down onto the floor.  Have a firm chair that has side arms. You can use this for support while you get dressed.  Do not have throw rugs and other things on the floor that can make you trip. What can I do in the kitchen?  Clean up any spills right away.  Avoid walking on wet floors.  Keep items that you use a lot in easy-to-reach places.  If you need to reach something above you, use a strong step stool that has a grab bar.  Keep electrical cords out of the way.  Do not use floor polish or wax that makes floors slippery. If you must use wax, use non-skid floor wax.  Do not have throw rugs and other things on the floor that can make you trip. What  can I do with my stairs?  Do not leave any items on the stairs.  Make sure that there are handrails on both sides of the stairs and use them. Fix handrails that are broken or loose. Make sure that handrails are as long as the stairways.  Check any carpeting to make sure that it is firmly attached to the stairs. Fix any carpet that is loose or worn.  Avoid having throw rugs at the top or bottom of the stairs. If you do have throw rugs, attach them to the floor with carpet tape.  Make sure that you have a light switch at the top of the stairs and the bottom of the stairs. If you do not have them, ask someone to add them for you. What else can I do to help prevent falls?  Wear shoes that:  Do not have high heels.  Have rubber bottoms.  Are comfortable and fit you well.  Are closed at the toe. Do not wear sandals.  If you use a stepladder:  Make sure that it is fully  opened. Do not climb a closed stepladder.  Make sure that both sides of the stepladder are locked into place.  Ask someone to hold it for you, if possible.  Clearly mark and make sure that you can see:  Any grab bars or handrails.  First and last steps.  Where the edge of each step is.  Use tools that help you move around (mobility aids) if they are needed. These include:  Canes.  Walkers.  Scooters.  Crutches.  Turn on the lights when you go into a dark area. Replace any light bulbs as soon as they burn out.  Set up your furniture so you have a clear path. Avoid moving your furniture around.  If any of your floors are uneven, fix them.  If there are any pets around you, be aware of where they are.  Review your medicines with your doctor. Some medicines can make you feel dizzy. This can increase your chance of falling. Ask your doctor what other things that you can do to help prevent falls. This information is not intended to replace advice given to you by your health care provider. Make sure you discuss any questions you have with your health care provider. Document Released: 02/25/2009 Document Revised: 10/07/2015 Document Reviewed: 06/05/2014 Elsevier Interactive Patient Education  2017 [employer].

## 2019-12-11 NOTE — Progress Notes (Signed)
This visit occurred during the SARS-CoV-2 public health emergency.  Safety protocols were in place, including screening questions prior to the visit, additional usage of staff PPE, and extensive cleaning of exam room while observing appropriate contact time as indicated for disinfecting solutions.  Subjective:   Sara Fox is a 79 y.o. female who presents for Medicare Annual (Subsequent) preventive examination.  Review of Systems     Cardiac Risk Factors include: advanced age (>5men, >71 women)     Objective:    Today's Vitals   12/11/19 1102  BP: (!) 130/74  Pulse: 60  Temp: 98.4 F (36.9 C)  TempSrc: Oral  Weight: 130 lb 6.4 oz (59.1 kg)  Height: 5\' 4"  (1.626 m)   Body mass index is 22.38 kg/m.  Advanced Directives 12/11/2019 12/05/2018 11/07/2017 07/11/2017 07/11/2017 07/10/2017 05/27/2017  Does Patient Have a Medical Advance Directive? Yes Yes Yes Yes (No Data) Yes No  Type of Advance Directive [place];Living will [place];Living will [place];Living will Living will - Living will -  Does patient want to make changes to medical advance directive? - No - Patient declined - No - Patient declined - - -  Copy of [place] in Chart? No - copy requested No - copy requested No - copy requested - - - -  Would patient like information on creating a medical advance directive? - - - - - - -    Current Medications (verified) Outpatient Encounter Medications as of 12/11/2019  Medication Sig  . aspirin EC 81 MG tablet Take 81 mg by mouth daily.  . budesonide-formoterol (SYMBICORT) 160-4.5 MCG/ACT inhaler Inhale 2 puffs into the lungs 2 (two) times daily.  Marland Kitchen CALCIUM CITRATE PO Take 1 tablet by mouth daily.   . Cholecalciferol (VITAMIN D3) 2000 UNITS capsule Take 2,000 Units by mouth daily.  . ciclopirox (PENLAC) 8 % solution Apply topically at bedtime. Apply over nail and surrounding skin. Apply daily over  previous coat. After seven (7) days, file nail and continue cycle.  . [employer] MAGNESIUM OXIDE 250 MG TABS SMARTSIG:1 Tablet(s) By Mouth Every Evening  . donepezil (ARICEPT) 5 MG tablet TAKE 1 TABLET BY MOUTH EVERYDAY AT BEDTIME  . furosemide (LASIX) 20 MG tablet TAKE 1 TABLET BY MOUTH EVERY OTHER DAY  . gabapentin (NEURONTIN) 100 MG capsule TAKE 1 CAPSULE BY MOUTH THREE TIMES DAILY  . metoprolol tartrate (LOPRESSOR) 25 MG tablet Take 1 tablet (25 mg total) by mouth 2 (two) times daily.  . Multiple Vitamin (MULTIVITAMIN) capsule Take 1 capsule by mouth daily. Contains iron  . nitroGLYCERIN (NITROSTAT) 0.4 MG SL tablet PLACE ONE TABLET UNDER THE TONGUE EVERY 5 MINUTES AS NEEDED FOR CHEST PAIN.  Marland Kitchen oxybutynin (DITROPAN) 5 MG tablet TAKE 1 TABLET BY MOUTH TWICE A DAY  . pantoprazole (PROTONIX) 40 MG tablet Take 1 tablet (40 mg total) by mouth 2 (two) times daily.  . polyethylene glycol (MIRALAX / GLYCOLAX) packet Take 17 g by mouth daily as needed for mild constipation.   . Potassium Chloride ER 20 MEQ TBCR Take 2 tablets by mouth in the morning.  . rosuvastatin (CRESTOR) 40 MG tablet Take 1 tablet (40 mg total) by mouth daily.  . traMADol (ULTRAM) 50 MG tablet TAKE 1-2 TABLETS THREE TIMES DAILY.  . vitamin C (ASCORBIC ACID) 500 MG tablet Take 500 mg by mouth daily.   No facility-administered encounter medications on file as of 12/11/2019.    Allergies (verified) Buprenorphine  hcl, Morphine and related, Celebrex [celecoxib], Vioxx [rofecoxib], and Codeine   History: Past Medical History:  Diagnosis Date  . Acute kidney injury ([place])   . Back pain   . Cervical cancer ([place])   . CHF (congestive heart failure) ([place])   . COPD (chronic obstructive pulmonary disease) ([place])   . Coronary artery disease    s/p DES x2 to RCA 2016  . GERD (gastroesophageal reflux disease)   . Hyperlipidemia   . Hypertension   . Myocardial infarction ([place])   . Neck pain   . Rhabdomyolysis   . Stomach problems    Past  Surgical History:  Procedure Laterality Date  . ABDOMINAL HYSTERECTOMY    . BACK SURGERY  2012   lower back  . CARDIAC CATHETERIZATION  06/1999   noncritical disease invovling PDA  . CARDIAC CATHETERIZATION N/A 09/14/2014   Procedure: Left Heart Cath and Coronary Angiography;  Surgeon: Leonie Man, MD;  Location: [facility] INVASIVE CV LAB CUPID;  Service: Cardiovascular;  Laterality: N/A;  . CHOLECYSTECTOMY    . ESOPHAGOGASTRODUODENOSCOPY (EGD) WITH PROPOFOL Left 07/19/2017   Procedure: ESOPHAGOGASTRODUODENOSCOPY (EGD) WITH PROPOFOL;  Surgeon: Ronnette Juniper, MD;  Location: [facility] ENDOSCOPY;  Service: Gastroenterology;  Laterality: Left;  . FRACTURE SURGERY Left 05/2017   left wrist  . HARDWARE REMOVAL Left 11/07/2017   Procedure: LEFT WRIST HARDWARE REMOVAL;  Surgeon: Charlotte Crumb, MD;  Location: [facility];  Service: Orthopedics;  Laterality: Left;  . KNEE SURGERY Bilateral 2001 & 2007  . NECK SURGERY  2012   2012  . PARTIAL GASTRECTOMY  2005   subtotal  . PERCUTANEOUS CORONARY STENT INTERVENTION (PCI-S)  09/14/2014   Procedure: Percutaneous Coronary Stent Intervention (Pci-S);  Surgeon: Leonie Man, MD;  Location: [facility] INVASIVE CV LAB CUPID;  Service: Cardiovascular;;  . SHOULDER SURGERY Right   . TRANSTHORACIC ECHOCARDIOGRAM  06/02/2010   EF=>55%, normal LV systolic function; normal RV systolic function; mild mitral annular calcif; trace TR; AV mildly sclerotic  . WRIST OSTEOTOMY Left 11/07/2017   Procedure: LEFT WRIST DISTAL ULNA RESECTION WITH EXTENSOR CARPI ULNARIS STABILIZATION;  Surgeon: Charlotte Crumb, MD;  Location: [facility];  Service: Orthopedics;  Laterality: Left;   Family History  Problem Relation Age of Onset  . Heart disease Mother        also HTN  . Diabetes Mother   . Colon cancer Mother   . Heart disease Brother        deceased at 51  . Hypertension Brother   . Heart attack Brother   . Heart disease Brother   . Hypertension Brother    Social History   Socioeconomic  History  . Marital status: Married    Spouse name: Not on file  . Number of children: 2  . Years of education: GED  . Highest education level: Not on file  Occupational History    Employer: RETIRED  Tobacco Use  . Smoking status: Current Every Day Smoker    Packs/day: 0.50    Years: 60.00    Pack years: 30.00  . Smokeless tobacco: Never Used  . Tobacco comment: currently smokes 1/2 ppd or less, she is using nicotine gum  Vaping Use  . Vaping Use: Former  Substance and Sexual Activity  . Alcohol use: No  . Drug use: No  . Sexual activity: Not Currently  Other Topics Concern  . Not on file  Social History Narrative  . Not on file   Social Determinants of Health   Financial Resource  Strain: Low Risk   . Difficulty of Paying Living Expenses: Not hard at all  Food Insecurity: No Food Insecurity  . Worried About Charity fundraiser in the Last Year: Never true  . Ran Out of Food in the Last Year: Never true  Transportation Needs: No Transportation Needs  . Lack of Transportation (Medical): No  . Lack of Transportation (Non-Medical): No  Physical Activity: Inactive  . Days of Exercise per Week: 0 days  . Minutes of Exercise per Session: 0 min  Stress: No Stress Concern Present  . Feeling of Stress : Not at all  Social Connections:   . Frequency of Communication with Friends and Family:   . Frequency of Social Gatherings with Friends and Family:   . Attends Religious Services:   . Active Member of Clubs or Organizations:   . Attends Archivist Meetings:   Marland Kitchen Marital Status:     Tobacco Counseling Ready to quit: Not Answered Counseling given: Not Answered Comment: currently smokes 1/2 ppd or less, she is using nicotine gum   Clinical Intake:  Pre-visit preparation completed: No  Pain : No/denies pain     Nutritional Status: BMI of 19-24  Normal Nutritional Risks: None Diabetes: No  How often do you need to have someone help you when you read  instructions, pamphlets, or other written materials from your doctor or pharmacy?: 1 - Never What is the last grade level you completed in school?: GED  Diabetic? no  Interpreter Needed?: No  Information entered by :: NAllen LPN   Activities of Daily Living In your present state of health, do you have any difficulty performing the following activities: 12/11/2019 12/11/2019  Hearing? N N  Vision? N N  Difficulty concentrating or making decisions? Y Y  Comment little bit -  Walking or climbing stairs? N N  Dressing or bathing? N N  Doing errands, shopping? Tempie Donning  Preparing Food and eating ? N -  Using the Toilet? N -  In the past six months, have you accidently leaked urine? N -  Do you have problems with loss of bowel control? N -  Managing your Medications? Y -  Comment son manages -  Managing your Finances? Y -  Comment son manages -  Housekeeping or managing your Housekeeping? Y -  Comment son manages -  Some recent data might be hidden    Patient Care Team: [name], FNP as PCP - General (General Practice) Debara Pickett Nadean Corwin, MD as PCP - Cardiology (Cardiology) [name] as Social Worker [place], Claudette Stapler, RN as Case Manager  Indicate any recent Medical Services you may have received from other than [facility] providers in the past year (date may be approximate).     Assessment:   This is a routine wellness examination for Marcellene.  Hearing/Vision screen  Hearing Screening   125Hz  250Hz  500Hz  1000Hz  2000Hz  3000Hz  4000Hz  6000Hz  8000Hz   Right ear:           Left ear:           Vision Screening Comments: No regular eye exams,   Dietary issues and exercise activities discussed: Current Exercise Habits: The patient does not participate in regular exercise at present  Goals    .  "to keep her dementia under good control"      Son stated [place] (see longitudinal plan of care for additional care plan information)  Current Barriers:  Marland Kitchen Knowledge Deficits  related to disease  process and Self Health management of dementia  . Chronic Disease Management support and education needs related to Stage 3a chronic kidney disease, HTN, chronic systolic heart failure, Mild Cognitive Impairment, Atherosclerotic heart disease of native coronary artery with other forms of angina pectoris  Nurse Case Manager Clinical Goal(s):  Marland Kitchen Over the next 90 days, patient will work with the [place] and PCP to address needs related to disease education and support to improve Self Health management of dementia  CCM RN CM Interventions:  10/22/19 call completed with patient's son [name] . Inter-disciplinary care team collaboration (see longitudinal plan of care) . Evaluation of current treatment plan related to dementia and patient's adherence to plan as established by provider. . Provided education to patient re: disease process for dementia and what to expect with disease progression  . Reviewed medications with patient and discussed son has noticed much improvement in patient's memory recall and behavior since starting Donepezil 5 mg q hs   . Discussed plans with patient for ongoing care management follow up and provided patient with direct contact information for care management team . Provided patient with printed educational materials related to Dementia  Patient Self Care Activities:  . Attends all scheduled provider appointments . Calls provider office for new concerns or questions . Supportive family to assist with patient care needs  Initial goal documentation     .  Collaborate with RN Case Manager to assist with care coordination needs      CARE PLAN ENTRY (see longtitudinal plan of care for additional care plan information)  Current Barriers:  . Social Isolation . Memory Deficits . HTN, CHF, CKD III, mild cognitive impairment  Social Work Clinical Goal(s):  Marland Kitchen Over the next 90 days, patient will work with care management team to address needs related to  care coordination needs and caregiver asistance.  CCM SW Interventions: Completed 11/12/19 . SW collaboration with [name] with [facility] requesting follow up on outcome of patient enrollment 4:30pm . Inbound call received from [name] stating patient was approved for [facility] enrollment with a $90 monthly co-pay o [name] spoke with the patient about this but has yet to receive contact with patient decision regarding enrollment . Successful outbound call placed to the patients daughter, [name] to discuss [facility] program and patient co-pay amount o [name] will touch base with patient and brother regarding family decision of enrollment  Patient Self Care Activities:  . Attends all scheduled provider appointments . Calls provider office for new concerns or questions . Supportive family to assist with patient care needs  Please see past updates related to this goal by clicking on the "Past Updates" button in the selected goal      .  Patient Stated      12/05/2018, patient goal to stay well    .  Patient Stated      12/11/2019, no goals    .  We want my mom to have a life alert (pt-stated)      Daughter stated  CARE PLAN ENTRY (see longitudinal plan of care for additional care plan information)  Current Barriers:  . Limited access to caregiver . Lacks knowledge of health plan benefits . Chronic conditions including HTN, CHF, CKD III, and cognitive impairment which lead to increase risk for falls  Social Work Clinical Goal(s):  Marland Kitchen Over the next 45 days the patient and her family will work with SW to obtain a personal emergency response  system (PERS) under the patients health plan benefit  CCM SW Interventions: Completed 11/12/19 with Tilda Burrow . Inter-disciplinary care team collaboration (see longitudinal plan of care) . Collaboration with patient primary provider requesting SW outreach to the patient daughter, [name], to assist with obtaining  a personal emergency response system (PERS) . Performed chart review to note current coverage is provided by [employer] . Successful outbound call placed to the patients daughter, Tilda Burrow to assist with resource needs . Educated [name] on the patients health plan benefits  . Assisted [name] in contacting [employer] whom is contracted to provide patient services under [employer] 1 . Successfully ordered patient a home personal emergency response system o Patient is eligible to receive a mobile PERS which would work outside of the home but is not safe for persons with pacemakers; [name] reports she is unsure if the patient has a pacemaker o SW collaboration with patient primary care provider requesting feedback indicating status of pacemaker o Informed by [employer], Clifton James, if the patient does not have a pacemaker SW can contact again to switch device to a mobile one . Scheduled follow up call over the next month to confirm receipt of device 4:15 pm . Collaboration with patient primary provider [name], FNP indicating patient does not have a pacemaker . Successful outbound call placed to [facility] to change patient PERS order to the mobile device which will work both in the home and out of the home . SW placed successful outbound call to Tilda Burrow to inform of change from home to mobile PERS device  Patient Self Care Activities:  . Attends all scheduled provider appointments . Calls pharmacy for medication refills . Calls provider office for new concerns or questions . Supportive family to assist with patient care needs  Initial goal documentation       Depression Screen PHQ 2/9 Scores 12/11/2019 07/10/2019 06/30/2019 06/11/2019 04/24/2019 03/27/2019 03/11/2019  PHQ - 2 Score 0 0 0 0 0 0 0  PHQ- 9 Score 0 - - - - - -    Fall Risk Fall Risk  12/11/2019 07/10/2019 06/30/2019 06/11/2019 04/24/2019  Falls in the past year? 1 1 1  0  0  Comment tripped over dog - - - -  Number falls in past yr: 0 1 0 - -  Injury with Fall? 0 0 0 - -  Risk Factor Category  - - - - -  Risk for fall due to : Impaired balance/gait;Medication side effect - - - -  Risk for fall due to: Comment - - - - -  Follow up Falls evaluation completed;Education provided;Falls prevention discussed - - - -  Comment - - - - -    Any stairs in or around the home? No  If so, are there any without handrails? n/a Home free of loose throw rugs in walkways, pet beds, electrical cords, etc? Yes  Adequate lighting in your home to reduce risk of falls? Yes   ASSISTIVE DEVICES UTILIZED TO PREVENT FALLS:  Life alert? Yes  Use of a cane, walker or w/c? Yes  Grab bars in the bathroom? No  Shower chair or bench in shower? No  Elevated toilet seat or a handicapped toilet? No   TIMED UP AND GO:  Was the test performed? No .    Gait slow and steady with assistive device  Cognitive Function:     6CIT Screen 12/11/2019 12/05/2018  What Year? 0  points 0 points  What month? 0 points 0 points  What time? 0 points 0 points  Count back from 20 0 points 0 points  Months in reverse 0 points 4 points  Repeat phrase 6 points 0 points  Total Score 6 4    Immunizations Immunization History  Administered Date(s) Administered  . PFIZER SARS-COV-2 Vaccination 08/30/2019, 09/25/2019    TDAP status: Up to date Flu Vaccine status: Declined, Education has been provided regarding the importance of this vaccine but patient still declined. Advised may receive this vaccine at local pharmacy or Health Dept. Aware to provide a copy of the vaccination record if obtained from local pharmacy or Health Dept. Verbalized acceptance and understanding. Pneumococcal vaccine status: Declined,  Education has been provided regarding the importance of this vaccine but patient still declined. Advised may receive this vaccine at local pharmacy or Health Dept. Aware to provide a copy of the  vaccination record if obtained from local pharmacy or Health Dept. Verbalized acceptance and understanding.  Covid-19 vaccine status: Completed vaccines  Qualifies for Shingles Vaccine? Yes   Zostavax completed No   Shingrix Completed?: No.    Education has been provided regarding the importance of this vaccine. Patient has been advised to call insurance company to determine out of pocket expense if they have not yet received this vaccine. Advised may also receive vaccine at local pharmacy or Health Dept. Verbalized acceptance and understanding.  Screening Tests Health Maintenance  Topic Date Due  . PNA vac Low Risk Adult (2 of 2 - PPSV23) 12/10/2020 (Originally 08/29/2012)  . INFLUENZA VACCINE  12/14/2019  . TETANUS/TDAP  08/29/2021  . DEXA SCAN  Completed  . COVID-19 Vaccine  Completed    Health Maintenance  There are no preventive care reminders to display for this patient.  Colorectal cancer screening: No longer required.  Mammogram status: No longer required.  Bone Density status: Completed 08/11/2017.   Lung Cancer Screening: (Low Dose CT Chest recommended if Age 46-80 years, 30 pack-year currently smoking OR have quit w/in 15years.) does not qualify.   Lung Cancer Screening Referral: no  Additional Screening:  Hepatitis C Screening: does not qualify;  Vision Screening: Recommended annual ophthalmology exams for early detection of glaucoma and other disorders of the eye. Is the patient up to date with their annual eye exam?  No  Who is the provider or what is the name of the office in which the patient attends annual eye exams? No eye doctor If pt is not established with a provider, would they like to be referred to a provider to establish care? No .   Dental Screening: Recommended annual dental exams for proper oral hygiene  Community Resource Referral / Chronic Care Management: CRR required this visit?  No   CCM required this visit?  No      Plan:     I have  personally reviewed and noted the following in the patient's chart:   . Medical and social history . Use of alcohol, tobacco or illicit drugs  . Current medications and supplements . Functional ability and status . Nutritional status . Physical activity . Advanced directives . List of other physicians . Hospitalizations, surgeries, and ER visits in previous 12 months . Vitals . Screenings to include cognitive, depression, and falls . Referrals and appointments  In addition, I have reviewed and discussed with patient certain preventive protocols, quality metrics, and best practice recommendations. A written personalized care plan for preventive services as well as general  preventive health recommendations were provided to patient.     Kellie Simmering, LPN   4/48/1856   Nurse Notes:

## 2019-12-11 NOTE — Progress Notes (Deleted)
This visit occurred during the SARS-CoV-2 public health emergency.  Safety protocols were in place, including screening questions prior to the visit, additional usage of staff PPE, and extensive cleaning of exam room while observing appropriate contact time as indicated for disinfecting solutions.  Subjective:     Patient ID: Sara Fox , female    DOB: 26-Mar-1941 , 79 y.o.   MRN: [id number]   Chief Complaint  Patient presents with  . Annual Exam    HPI  HPI   Past Medical History:  Diagnosis Date  . Acute kidney injury ([place])   . Back pain   . Cervical cancer ([place])   . CHF (congestive heart failure) ([place])   . COPD (chronic obstructive pulmonary disease) ([place])   . Coronary artery disease    s/p DES x2 to RCA 2016  . GERD (gastroesophageal reflux disease)   . Hyperlipidemia   . Hypertension   . Myocardial infarction ([place])   . Neck pain   . Rhabdomyolysis   . Stomach problems      Family History  Problem Relation Age of Onset  . Heart disease Mother        also HTN  . Diabetes Mother   . Colon cancer Mother   . Heart disease Brother        deceased at 43  . Hypertension Brother   . Heart attack Brother   . Heart disease Brother   . Hypertension Brother      Current Outpatient Medications:  .  aspirin EC 81 MG tablet, Take 81 mg by mouth daily., Disp: , Rfl:  .  budesonide-formoterol (SYMBICORT) 160-4.5 MCG/ACT inhaler, Inhale 2 puffs into the lungs 2 (two) times daily., Disp: 3 Inhaler, Rfl: 1 .  CALCIUM CITRATE PO, Take 1 tablet by mouth daily. , Disp: , Rfl:  .  Cholecalciferol (VITAMIN D3) 2000 UNITS capsule, Take 2,000 Units by mouth daily., Disp: , Rfl:  .  ciclopirox (PENLAC) 8 % solution, Apply topically at bedtime. Apply over nail and surrounding skin. Apply daily over previous coat. After seven (7) days, file nail and continue cycle., Disp: 6.6 mL, Rfl: 0 .  [employer] MAGNESIUM OXIDE 250 MG TABS, SMARTSIG:1 Tablet(s) By Mouth Every Evening, Disp: , Rfl:  .   donepezil (ARICEPT) 5 MG tablet, TAKE 1 TABLET BY MOUTH EVERYDAY AT BEDTIME, Disp: 90 tablet, Rfl: 1 .  furosemide (LASIX) 20 MG tablet, TAKE 1 TABLET BY MOUTH EVERY OTHER DAY, Disp: 45 tablet, Rfl: 1 .  gabapentin (NEURONTIN) 100 MG capsule, TAKE 1 CAPSULE BY MOUTH THREE TIMES DAILY, Disp: 270 capsule, Rfl: 0 .  Magnesium 250 MG TABS, Take 1 tablet by mouth with evening meal daily, Disp: 90 tablet, Rfl: 1 .  metoprolol tartrate (LOPRESSOR) 25 MG tablet, Take 1 tablet (25 mg total) by mouth 2 (two) times daily., Disp: 180 tablet, Rfl: 0 .  Multiple Vitamin (MULTIVITAMIN) capsule, Take 1 capsule by mouth daily. Contains iron, Disp: , Rfl:  .  nitroGLYCERIN (NITROSTAT) 0.4 MG SL tablet, PLACE ONE TABLET UNDER THE TONGUE EVERY 5 MINUTES AS NEEDED FOR CHEST PAIN., Disp: 50 tablet, Rfl: 0 .  oxybutynin (DITROPAN) 5 MG tablet, TAKE 1 TABLET BY MOUTH TWICE A DAY, Disp: 180 tablet, Rfl: 1 .  pantoprazole (PROTONIX) 40 MG tablet, Take 1 tablet (40 mg total) by mouth 2 (two) times daily., Disp: 180 tablet, Rfl: 1 .  polyethylene glycol (MIRALAX / GLYCOLAX) packet, Take 17 g by mouth daily as needed for mild  constipation. , Disp: , Rfl:  .  Potassium Chloride ER 20 MEQ TBCR, Take 2 tablets by mouth in the morning., Disp: 60 tablet, Rfl: 11 .  rosuvastatin (CRESTOR) 40 MG tablet, Take 1 tablet (40 mg total) by mouth daily., Disp: 30 tablet, Rfl: 8 .  traMADol (ULTRAM) 50 MG tablet, TAKE 1-2 TABLETS THREE TIMES DAILY., Disp: 140 tablet, Rfl: 1 .  vitamin C (ASCORBIC ACID) 500 MG tablet, Take 500 mg by mouth daily., Disp: , Rfl:    Allergies  Allergen Reactions  . Buprenorphine Hcl Shortness Of Breath    Throat swelling/trouble breathing and lethargic  . Morphine And Related Shortness Of Breath    Throat swelling/trouble breathing and lethargic  . Celebrex [Celecoxib] Diarrhea and Swelling    Swelling of legs   . Vioxx [Rofecoxib] Palpitations  . Codeine Other (See Comments)    Bloated       The  patient states she uses {contraceptive methods:5051} for birth control. Last LMP was No LMP recorded. Patient has had a hysterectomy.. {Dysmenorrhea-menorrhagia:21918}. Negative for: breast discharge, breast lump(s), breast pain and breast self exam. Associated symptoms include abnormal vaginal bleeding. Pertinent negatives include abnormal bleeding (hematology), anxiety, decreased libido, depression, difficulty falling sleep, dyspareunia, history of infertility, nocturia, sexual dysfunction, sleep disturbances, urinary incontinence, urinary urgency, vaginal discharge and vaginal itching. Diet regular.The patient states her exercise level is    . The patient's tobacco use is:  Social History   Tobacco Use  Smoking Status Current Every Day Smoker  . Packs/day: 0.50  . Years: 60.00  . Pack years: 30.00  Smokeless Tobacco Never Used  Tobacco Comment   currently smokes 1/2ppd or less, she is using nicotine gum  . She has been exposed to passive smoke. The patient's alcohol use is:  Social History   Substance and Sexual Activity  Alcohol Use No  . Additional information: Last pap ***, next one scheduled for ***.    Review of Systems  Constitutional: Negative.   HENT: Negative.   Eyes: Negative.   Respiratory: Negative.   Cardiovascular: Negative.   Gastrointestinal: Negative.   Endocrine: Negative.   Genitourinary: Negative.   Musculoskeletal: Negative.   Skin: Negative.   Allergic/Immunologic: Negative.   Neurological: Negative.   Hematological: Negative.   Psychiatric/Behavioral: Negative.      There were no vitals filed for this visit. There is no height or weight on file to calculate BMI.   Objective:  Physical Exam Constitutional:      Appearance: Normal appearance.  Cardiovascular:     Rate and Rhythm: Normal rate and regular rhythm.     Pulses: Normal pulses.     Heart sounds: Normal heart sounds.  Pulmonary:     Effort: Pulmonary effort is normal.     Breath  sounds: Normal breath sounds.  Neurological:     Mental Status: She is alert.         Assessment And Plan:     1. Encounter for general adult medical examination w/o abnormal findings  2. Essential hypertension  3. Stage 3a chronic kidney disease     Patient was given opportunity to ask questions. Patient verbalized understanding of the plan and was able to repeat key elements of the plan. All questions were answered to their satisfaction.   [address] [place]ra[place], CMA   I, [place], [place], have reviewed all documentation for this visit. The documentation on 12/11/19 for the exam, diagnosis, procedures, and orders are all accurate and complete.  THE PATIENT IS ENCOURAGED TO PRACTICE SOCIAL DISTANCING DUE TO THE COVID-19 PANDEMIC.

## 2019-12-11 NOTE — Progress Notes (Signed)
This visit occurred during the SARS-CoV-2 public health emergency.  Safety protocols were in place, including screening questions prior to the visit, additional usage of staff PPE, and extensive cleaning of exam room while observing appropriate contact time as indicated for disinfecting solutions.  Subjective:     Patient ID: Sara Fox , female    DOB: 1941-02-01 , 79 y.o.   MRN: [id number]   Chief Complaint  Patient presents with  . Annual Exam    HPI  Here for HM     Past Medical History:  Diagnosis Date  . Acute kidney injury ([place])   . Back pain   . Cervical cancer ([place])   . CHF (congestive heart failure) ([place])   . COPD (chronic obstructive pulmonary disease) ([place])   . Coronary artery disease    s/p DES x2 to RCA 2016  . GERD (gastroesophageal reflux disease)   . Hyperlipidemia   . Hypertension   . Myocardial infarction ([place])   . Neck pain   . Rhabdomyolysis   . Stomach problems      Family History  Problem Relation Age of Onset  . Heart disease Mother        also HTN  . Diabetes Mother   . Colon cancer Mother   . Heart disease Brother        deceased at 40  . Hypertension Brother   . Heart attack Brother   . Heart disease Brother   . Hypertension Brother      Current Outpatient Medications:  .  aspirin EC 81 MG tablet, Take 81 mg by mouth daily., Disp: , Rfl:  .  budesonide-formoterol (SYMBICORT) 160-4.5 MCG/ACT inhaler, Inhale 2 puffs into the lungs 2 (two) times daily., Disp: 3 Inhaler, Rfl: 1 .  CALCIUM CITRATE PO, Take 1 tablet by mouth daily. , Disp: , Rfl:  .  Cholecalciferol (VITAMIN D3) 2000 UNITS capsule, Take 2,000 Units by mouth daily., Disp: , Rfl:  .  ciclopirox (PENLAC) 8 % solution, Apply topically at bedtime. Apply over nail and surrounding skin. Apply daily over previous coat. After seven (7) days, file nail and continue cycle., Disp: 6.6 mL, Rfl: 0 .  [employer]S MAGNESIUM OXIDE 250 MG TABS, SMARTSIG:1 Tablet(s) By Mouth Every Evening, Disp:  , Rfl:  .  donepezil (ARICEPT) 5 MG tablet, TAKE 1 TABLET BY MOUTH EVERYDAY AT BEDTIME, Disp: 90 tablet, Rfl: 1 .  furosemide (LASIX) 20 MG tablet, TAKE 1 TABLET BY MOUTH EVERY OTHER DAY, Disp: 45 tablet, Rfl: 1 .  gabapentin (NEURONTIN) 100 MG capsule, TAKE 1 CAPSULE BY MOUTH THREE TIMES DAILY, Disp: 270 capsule, Rfl: 0 .  metoprolol tartrate (LOPRESSOR) 25 MG tablet, Take 1 tablet (25 mg total) by mouth 2 (two) times daily., Disp: 180 tablet, Rfl: 0 .  Multiple Vitamin (MULTIVITAMIN) capsule, Take 1 capsule by mouth daily. Contains iron, Disp: , Rfl:  .  nitroGLYCERIN (NITROSTAT) 0.4 MG SL tablet, PLACE ONE TABLET UNDER THE TONGUE EVERY 5 MINUTES AS NEEDED FOR CHEST PAIN., Disp: 50 tablet, Rfl: 0 .  oxybutynin (DITROPAN) 5 MG tablet, TAKE 1 TABLET BY MOUTH TWICE A DAY, Disp: 180 tablet, Rfl: 1 .  pantoprazole (PROTONIX) 40 MG tablet, Take 1 tablet (40 mg total) by mouth 2 (two) times daily., Disp: 180 tablet, Rfl: 1 .  polyethylene glycol (MIRALAX / GLYCOLAX) packet, Take 17 g by mouth daily as needed for mild constipation. , Disp: , Rfl:  .  Potassium Chloride ER 20 MEQ TBCR, Take 2  tablets by mouth in the morning., Disp: 60 tablet, Rfl: 11 .  rosuvastatin (CRESTOR) 40 MG tablet, Take 1 tablet (40 mg total) by mouth daily., Disp: 30 tablet, Rfl: 8 .  traMADol (ULTRAM) 50 MG tablet, TAKE 1-2 TABLETS THREE TIMES DAILY., Disp: 140 tablet, Rfl: 1 .  vitamin C (ASCORBIC ACID) 500 MG tablet, Take 500 mg by mouth daily., Disp: , Rfl:    Allergies  Allergen Reactions  . Buprenorphine Hcl Shortness Of Breath    Throat swelling/trouble breathing and lethargic  . Morphine And Related Shortness Of Breath    Throat swelling/trouble breathing and lethargic  . Celebrex [Celecoxib] Diarrhea and Swelling    Swelling of legs   . Vioxx [Rofecoxib] Palpitations  . Codeine Other (See Comments)    Bloated        No LMP recorded. Patient has had a hysterectomy. Negative for: breast discharge, breast  lump(s), breast pain and breast self exam. Associated symptoms include abnormal vaginal bleeding. Pertinent negatives include abnormal bleeding (hematology), anxiety, decreased libido, depression, difficulty falling sleep, dyspareunia, history of infertility, nocturia, sexual dysfunction, sleep disturbances, urinary incontinence, urinary urgency, vaginal discharge and vaginal itching. Diet regular. The patient states her exercise level is minimal with walking "back and forth"  The patient's tobacco use is:  Social History   Tobacco Use  Smoking Status Current Every Day Smoker  . Packs/day: 0.50  . Years: 60.00  . Pack years: 30.00  Smokeless Tobacco Never Used  Tobacco Comment   currently smokes 1/2 ppd or less, she is using nicotine gum   She has been exposed to passive smoke. The patient's alcohol use is:  Social History   Substance and Sexual Activity  Alcohol Use No    Review of Systems  Constitutional: Negative.   HENT: Negative.   Eyes: Negative.   Respiratory: Negative.   Cardiovascular: Negative.   Gastrointestinal: Negative.   Endocrine: Negative.   Genitourinary: Negative.   Musculoskeletal: Negative.   Skin: Negative.   Allergic/Immunologic: Negative.   Neurological: Negative.   Hematological: Negative.   Psychiatric/Behavioral: Negative.      Today's Vitals   12/11/19 1058  BP: (!) 130/74  Pulse: 60  Temp: 98.4 F (36.9 C)  TempSrc: Oral  Weight: 130 lb 6.4 oz (59.1 kg)  Height: 5' 4.4" (1.636 m)  PainSc: 0-No pain   Body mass index is 22.11 kg/m.   Objective:  Physical Exam Vitals reviewed.  Constitutional:      General: She is not in acute distress.    Appearance: Normal appearance. She is well-developed and normal weight.  HENT:     Head: Normocephalic and atraumatic.     Right Ear: Hearing, tympanic membrane, ear canal and external ear normal. There is no impacted cerumen.     Left Ear: Hearing, tympanic membrane, ear canal and external ear  normal. There is no impacted cerumen.     Nose:     Comments: Deferred - mask    Mouth/Throat:     Comments: Deferred - masked Eyes:     General: Lids are normal.     Extraocular Movements: Extraocular movements intact.     Conjunctiva/sclera: Conjunctivae normal.     Pupils: Pupils are equal, round, and reactive to light.     Funduscopic exam:    Right eye: No papilledema.        Left eye: No papilledema.  Neck:     Thyroid: No thyroid mass.     Vascular: No carotid bruit.  Cardiovascular:     Rate and Rhythm: Normal rate and regular rhythm.     Pulses: Normal pulses.     Heart sounds: Normal heart sounds. No murmur heard.   Pulmonary:     Effort: Pulmonary effort is normal. No respiratory distress.     Breath sounds: Normal breath sounds. No wheezing.  Abdominal:     General: Abdomen is flat. Bowel sounds are normal. There is no distension.     Palpations: Abdomen is soft.     Tenderness: There is no abdominal tenderness.  Genitourinary:    Rectum: Guaiac result negative.  Musculoskeletal:        General: No swelling or tenderness. Normal range of motion.     Cervical back: Full passive range of motion without pain, normal range of motion and neck supple.     Right lower leg: No edema.     Left lower leg: No edema.  Skin:    General: Skin is warm and dry.     Capillary Refill: Capillary refill takes less than 2 seconds.  Neurological:     General: No focal deficit present.     Mental Status: She is alert and oriented to person, place, and time.     Cranial Nerves: No cranial nerve deficit.     Sensory: No sensory deficit.  Psychiatric:        Mood and Affect: Mood normal.        Behavior: Behavior normal.        Thought Content: Thought content normal.        Judgment: Judgment normal.         Assessment And Plan:     1. Encounter for general adult medical examination w/o abnormal findings . Behavior modifications discussed and diet history reviewed.   . Pt  will continue to exercise regularly and modify diet with low GI, plant based foods and decrease intake of processed foods.  . Recommend intake of daily multivitamin, Vitamin D, and calcium.  . Recommend for preventive screenings, as well as recommend immunizations that include influenza, TDAP (up to date)  2. Essential hypertension . B/P is fairly controlled.  . CMP ordered to check renal function.  . The importance of regular exercise and dietary modification was stressed to the patient.  . Encouraged to quit smoking . EKG done by Cardiology - POCT UA - Microalbumin - CMP14+EGFR - CBC  3. Stage 3a chronic kidney disease  4. 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   6. Atherosclerotic heart disease of native coronary artery with other forms of angina pectoris ([place])  Chronic, continue follow up with Cardiology - Lipid panel     Patient was given opportunity to ask questions. Patient verbalized understanding of the plan and was able to repeat key elements of the plan. All questions were answered to their satisfaction.   [name], FNP   I, [name], FNP, have reviewed all documentation for this visit. The documentation on 12/13/19 for the exam, diagnosis, procedures, and orders are all accurate and complete.  THE PATIENT IS ENCOURAGED TO PRACTICE SOCIAL DISTANCING DUE TO THE COVID-19 PANDEMIC.

## 2019-12-12 LAB — LIPID PANEL
Chol/HDL Ratio: 3.4 ratio (ref 0.0–4.4)
Cholesterol, Total: 161 mg/dL (ref 100–199)
HDL: 47 mg/dL (ref >39)
LDL Chol Calc (NIH): 93 mg/dL (ref 0–99)
Triglycerides: 118 mg/dL (ref 0–149)
VLDL Cholesterol Cal: 21 mg/dL (ref 5–40)

## 2019-12-13 NOTE — Patient Instructions (Signed)
Health Maintenance After Age 80 After age 80, you are at a higher risk for certain long-term diseases and infections as well as injuries from falls. Falls are a major cause of broken bones and head injuries in people who are older than age 80. Getting regular preventive care can help to keep you healthy and well. Preventive care includes getting regular testing and making lifestyle changes as recommended by your health care provider. Talk with your health care provider about:  Which screenings and tests you should have. A screening is a test that checks for a disease when you have no symptoms.  A diet and exercise plan that is right for you. What should I know about screenings and tests to prevent falls? Screening and testing are the best ways to find a health problem early. Early diagnosis and treatment give you the best chance of managing medical conditions that are common after age 80. Certain conditions and lifestyle choices may make you more likely to have a fall. Your health care provider may recommend:  Regular vision checks. Poor vision and conditions such as cataracts can make you more likely to have a fall. If you wear glasses, make sure to get your prescription updated if your vision changes.  Medicine review. Work with your health care provider to regularly review all of the medicines you are taking, including over-the-counter medicines. Ask your health care provider about any side effects that may make you more likely to have a fall. Tell your health care provider if any medicines that you take make you feel dizzy or sleepy.  Osteoporosis screening. Osteoporosis is a condition that causes the bones to get weaker. This can make the bones weak and cause them to break more easily.  Blood pressure screening. Blood pressure changes and medicines to control blood pressure can make you feel dizzy.  Strength and balance checks. Your health care provider may recommend certain tests to check your  strength and balance while standing, walking, or changing positions.  Foot health exam. Foot pain and numbness, as well as not wearing proper footwear, can make you more likely to have a fall.  Depression screening. You may be more likely to have a fall if you have a fear of falling, feel emotionally low, or feel unable to do activities that you used to do.  Alcohol use screening. Using too much alcohol can affect your balance and may make you more likely to have a fall. What actions can I take to lower my risk of falls? General instructions  Talk with your health care provider about your risks for falling. Tell your health care provider if: ? You fall. Be sure to tell your health care provider about all falls, even ones that seem minor. ? You feel dizzy, sleepy, or off-balance.  Take over-the-counter and prescription medicines only as told by your health care provider. These include any supplements.  Eat a healthy diet and maintain a healthy weight. A healthy diet includes low-fat dairy products, low-fat (lean) meats, and fiber from whole grains, beans, and lots of fruits and vegetables. Home safety  Remove any tripping hazards, such as rugs, cords, and clutter.  Install safety equipment such as grab bars in bathrooms and safety rails on stairs.  Keep rooms and walkways well-lit. Activity   Follow a regular exercise program to stay fit. This will help you maintain your balance. Ask your health care provider what types of exercise are appropriate for you.  If you need a cane or   walker, use it as recommended by your health care provider.  Wear supportive shoes that have nonskid soles. Lifestyle  Do not drink alcohol if your health care provider tells you not to drink.  If you drink alcohol, limit how much you have: ? 0-1 drink a day for women. ? 0-2 drinks a day for men.  Be aware of how much alcohol is in your drink. In the [place] one drink equals one typical bottle of beer (12  oz), one-half glass of wine (5 oz), or one shot of hard liquor (1 oz).  Do not use any products that contain nicotine or tobacco, such as cigarettes and e-cigarettes. If you need help quitting, ask your health care provider. Summary  Having a healthy lifestyle and getting preventive care can help to protect your health and wellness after age 80.  Screening and testing are the best way to find a health problem early and help you avoid having a fall. Early diagnosis and treatment give you the best chance for managing medical conditions that are more common for people who are older than age 80.  Falls are a major cause of broken bones and head injuries in people who are older than age 80. Take precautions to prevent a fall at home.  Work with your health care provider to learn what changes you can make to improve your health and wellness and to prevent falls. This information is not intended to replace advice given to you by your health care provider. Make sure you discuss any questions you have with your health care provider. Document Revised: 08/22/2018 Document Reviewed: 03/14/2017 [employer] Patient Education  2020 [employer]  

## 2019-12-15 ENCOUNTER — Ambulatory Visit (INDEPENDENT_AMBULATORY_CARE_PROVIDER_SITE_OTHER): Payer: Medicare Other

## 2019-12-15 DIAGNOSIS — N1831 Chronic kidney disease, stage 3a: Secondary | ICD-10-CM

## 2019-12-15 DIAGNOSIS — G3184 Mild cognitive impairment, so stated: Secondary | ICD-10-CM

## 2019-12-15 NOTE — Chronic Care Management (AMB) (Signed)
Chronic Care Management    Social Work Follow Up Note  12/15/2019 Name: Sara Fox MRN: [id number] DOB: Jun 19, 1940  Sara Fox is a 79 y.o. year old female who is a primary care patient of [name], [place]. The CCM team was consulted for assistance with care coordination.   Review of patient status, including review of consultants reports, other relevant assessments, and collaboration with appropriate care team members and the patient's provider was performed as part of comprehensive patient evaluation and provision of chronic care management services.    SDOH (Social Determinants of Health) assessments performed: No    Outpatient Encounter Medications as of 12/15/2019  Medication Sig  . aspirin EC 81 MG tablet Take 81 mg by mouth daily.  . budesonide-formoterol (SYMBICORT) 160-4.5 MCG/ACT inhaler Inhale 2 puffs into the lungs 2 (two) times daily.  Sara Fox CALCIUM CITRATE PO Take 1 tablet by mouth daily.   . Cholecalciferol (VITAMIN D3) 2000 UNITS capsule Take 2,000 Units by mouth daily.  . ciclopirox (PENLAC) 8 % solution Apply topically at bedtime. Apply over nail and surrounding skin. Apply daily over previous coat. After seven (7) days, file nail and continue cycle.  . [employer] MAGNESIUM OXIDE 250 MG TABS SMARTSIG:1 Tablet(s) By Mouth Every Evening  . donepezil (ARICEPT) 5 MG tablet TAKE 1 TABLET BY MOUTH EVERYDAY AT BEDTIME  . furosemide (LASIX) 20 MG tablet TAKE 1 TABLET BY MOUTH EVERY OTHER DAY  . gabapentin (NEURONTIN) 100 MG capsule TAKE 1 CAPSULE BY MOUTH THREE TIMES DAILY  . metoprolol tartrate (LOPRESSOR) 25 MG tablet Take 1 tablet (25 mg total) by mouth 2 (two) times daily.  . Multiple Vitamin (MULTIVITAMIN) capsule Take 1 capsule by mouth daily. Contains iron  . nitroGLYCERIN (NITROSTAT) 0.4 MG SL tablet PLACE ONE TABLET UNDER THE TONGUE EVERY 5 MINUTES AS NEEDED FOR CHEST PAIN.  Sara Fox oxybutynin (DITROPAN) 5 MG tablet TAKE 1 TABLET BY MOUTH TWICE A DAY  . pantoprazole (PROTONIX) 40  MG tablet Take 1 tablet (40 mg total) by mouth 2 (two) times daily.  . polyethylene glycol (MIRALAX / GLYCOLAX) packet Take 17 g by mouth daily as needed for mild constipation.   . Potassium Chloride ER 20 MEQ TBCR Take 2 tablets by mouth in the morning.  . rosuvastatin (CRESTOR) 40 MG tablet Take 1 tablet (40 mg total) by mouth daily.  . traMADol (ULTRAM) 50 MG tablet TAKE 1-2 TABLETS THREE TIMES DAILY.  . vitamin C (ASCORBIC ACID) 500 MG tablet Take 500 mg by mouth daily.   No facility-administered encounter medications on file as of 12/15/2019.     Goals Addressed              This Visit's Progress     Patient Stated   .  COMPLETED: We want my mom to have a life alert (pt-stated)        Daughter stated  CARE PLAN ENTRY (see longitudinal plan of care for additional care plan information)  Current Barriers:  . Limited access to caregiver . Lacks knowledge of health plan benefits . Chronic conditions including HTN, CHF, CKD III, and cognitive impairment which lead to increase risk for falls  Social Work Clinical Goal(s):  Sara Fox Over the next 45 days the patient and her family will work with SW to obtain a personal emergency response system (PERS) under the patients health plan benefit  CCM SW Interventions: Completed 12/15/19 with Tilda Burrow . Successful outbound call placed to Tilda Burrow to follow up on goal  progression . Confirmed the patient has received PERS in the mail and is actively wearing it daily o The patient daughter expresses relief after receiving system due to increased concern of the patient wandering . Advised [name] a PERS system will not track the patient if she wanders o Discussed the system in designed with GPS tracking to be enabled once the emergency button is pressed by the patient . Successful outbound call placed to [employer] to confirm this feature o Spoke with representative Herbie Baltimore who confirms the PERS system will not track the patient on a  regular basis and is not to be used as a safety net for a person who wanders . Successful outbound call placed to Tilda Burrow to review this information and assess for alternative plan o [name] reports she spoke with her brother who will plan to monitor the patient closely for changes in cognition o Discussed the family may try to buy a tracking device to add to the PERS system in the event the patient begins to wander o Collaboration with PCP and RN Care Manager regarding reports of wandering and need to monitor dementia progression . Goal Met  Patient Self Care Activities:  . Attends all scheduled provider appointments . Calls pharmacy for medication refills . Calls provider office for new concerns or questions . Supportive family to assist with patient care needs  Initial goal documentation and Please see past updates related to this goal by clicking on the "Past Updates" button in the selected goal        Other   .  Collaborate with RN Case Manager to assist with care coordination needs        CARE PLAN ENTRY (see longtitudinal plan of care for additional care plan information)  Current Barriers:  . Social Isolation . Memory Deficits . HTN, CHF, CKD III, mild cognitive impairment  Social Work Clinical Goal(s):  Sara Fox Over the next 90 days, patient will work with care management team to address needs related to care coordination needs and caregiver asistance.  CCM SW Interventions: Completed 12/15/19 with the patient and her son, [name] . Successful outbound call placed to the patient to follow up on goal progression . Reviewed the patient eligibility to enroll in [facility] with monthly co-pay amount of approximately $90 . Determined the patient does not feel like she can afford the cost at this time . Scheduled follow up call over the next month to assist with ongoing care coordination needs  Patient Self Care Activities:  . Attends all scheduled provider  appointments . Calls provider office for new concerns or questions . Supportive family to assist with patient care needs  Please see past updates related to this goal by clicking on the "Past Updates" button in the selected goal          Follow Up Plan: SW will follow up with patient by phone over the next month.   [name], BSW, CDP Social Worker, Certified Dementia Practitioner [place] [facility] [phone]  Total time spent performing care coordination and/or care management activities with the patient by phone or face to face = 40 minutes.

## 2019-12-15 NOTE — Patient Instructions (Signed)
Social Worker Visit Information  Goals we discussed today:  Goals Addressed              This Visit's Progress     Patient Stated   .  COMPLETED: We want my mom to have a life alert (pt-stated)        Daughter stated  CARE PLAN ENTRY (see longitudinal plan of care for additional care plan information)  Current Barriers:  . Limited access to caregiver . Lacks knowledge of health plan benefits . Chronic conditions including HTN, CHF, CKD III, and cognitive impairment which lead to increase risk for falls  Social Work Clinical Goal(s):  Marland Kitchen Over the next 45 days the patient and her family will work with SW to obtain a personal emergency response system (PERS) under the patients health plan benefit  CCM SW Interventions: Completed 12/15/19 with Tilda Burrow . Successful outbound call placed to [facility] to follow up on goal progression . Confirmed the patient has received PERS in the mail and is actively wearing it daily o The patient daughter expresses relief after receiving system due to increased concern of the patient wandering . Advised [name] a PERS system will not track the patient if she wanders o Discussed the system in designed with GPS tracking to be enabled once the emergency button is pressed by the patient . Successful outbound call placed to [employer] to confirm this feature o Spoke with representative Herbie Baltimore who confirms the PERS system will not track the patient on a regular basis and is not to be used as a safety net for a person who wanders . Successful outbound call placed to Tilda Burrow to review this information and assess for alternative plan o [name] reports she spoke with her brother who will plan to monitor the patient closely for changes in cognition o Discussed the family may try to buy a tracking device to add to the PERS system in the event the patient begins to wander o Collaboration with PCP and RN Care Manager regarding concern for  wandering and need to monitor dementia progression closely . Goal Met  Patient Self Care Activities:  . Attends all scheduled provider appointments . Calls pharmacy for medication refills . Calls provider office for new concerns or questions . Supportive family to assist with patient care needs  Initial goal documentation and Please see past updates related to this goal by clicking on the "Past Updates" button in the selected goal        Other   .  Collaborate with RN Case Manager to assist with care coordination needs        CARE PLAN ENTRY (see longtitudinal plan of care for additional care plan information)  Current Barriers:  . Social Isolation . Memory Deficits . HTN, CHF, CKD III, mild cognitive impairment  Social Work Clinical Goal(s):  Marland Kitchen Over the next 90 days, patient will work with care management team to address needs related to care coordination needs and caregiver asistance.  CCM SW Interventions: Completed 12/15/19 with the patient and her son, [name] . Successful outbound call placed to the patient to follow up on goal progression . Reviewed the patient eligibility to enroll in [facility] with monthly co-pay amount of approximately $90 . Determined the patient does not feel like she can afford the cost at this time . Scheduled follow up call over the next month to assist with ongoing care coordination needs  Patient Self Care Activities:  .  Attends all scheduled provider appointments . Calls provider office for new concerns or questions . Supportive family to assist with patient care needs  Please see past updates related to this goal by clicking on the "Past Updates" button in the selected goal          Materials Provided: Verbal education about PERS system provided by phone  Follow Up Plan: SW will follow up with patient by phone over the next month.   [name], BSW, CDP Social Worker, Certified Dementia Practitioner [place] [facility] [place]  [facility] [phone]

## 2019-12-16 ENCOUNTER — Other Ambulatory Visit: Payer: Self-pay | Admitting: Nurse Practitioner

## 2019-12-18 ENCOUNTER — Other Ambulatory Visit: Payer: Self-pay

## 2019-12-18 ENCOUNTER — Ambulatory Visit: Payer: Self-pay

## 2019-12-18 ENCOUNTER — Telehealth: Payer: Medicare Other

## 2019-12-18 DIAGNOSIS — I5022 Chronic systolic (congestive) heart failure: Secondary | ICD-10-CM

## 2019-12-18 DIAGNOSIS — N1831 Chronic kidney disease, stage 3a: Secondary | ICD-10-CM

## 2019-12-18 DIAGNOSIS — I130 Hypertensive heart and chronic kidney disease with heart failure and stage 1 through stage 4 chronic kidney disease, or unspecified chronic kidney disease: Secondary | ICD-10-CM | POA: Diagnosis not present

## 2019-12-18 DIAGNOSIS — I25118 Atherosclerotic heart disease of native coronary artery with other forms of angina pectoris: Secondary | ICD-10-CM

## 2019-12-18 DIAGNOSIS — I10 Essential (primary) hypertension: Secondary | ICD-10-CM

## 2019-12-18 DIAGNOSIS — G3184 Mild cognitive impairment, so stated: Secondary | ICD-10-CM

## 2019-12-19 NOTE — Chronic Care Management (AMB) (Signed)
Chronic Care Management   Follow Up Note   12/18/2019 Name: Sara Fox MRN: [id number] DOB: 07-11-1940  Referred by: [name], FNP Reason for referral : Chronic Care Management ([facility] RN CM Call)   Sara Fox is a 79 y.o. year old female who is a primary care patient of [name], [place]. The CCM team was consulted for assistance with chronic disease management and care coordination needs.    Review of patient status, including review of consultants reports, relevant laboratory and other test results, and collaboration with appropriate care team members and the patient's provider was performed as part of comprehensive patient evaluation and provision of chronic care management services.    SDOH (Social Determinants of Health) assessments performed: Yes  See Care Plan activities for detailed interventions related to SDOH)   Placed CCM RN CM outbound call to daughter Sara Fox for a care plan follow up.     Outpatient Encounter Medications as of 12/18/2019  Medication Sig  . aspirin EC 81 MG tablet Take 81 mg by mouth daily.  . budesonide-formoterol (SYMBICORT) 160-4.5 MCG/ACT inhaler Inhale 2 puffs into the lungs 2 (two) times daily.  Marland Kitchen CALCIUM CITRATE PO Take 1 tablet by mouth daily.   . Cholecalciferol (VITAMIN D3) 2000 UNITS capsule Take 2,000 Units by mouth daily.  . ciclopirox (PENLAC) 8 % solution Apply topically at bedtime. Apply over nail and surrounding skin. Apply daily over previous coat. After seven (7) days, file nail and continue cycle.  . [employer] MAGNESIUM OXIDE 250 MG TABS SMARTSIG:1 Tablet(s) By Mouth Every Evening  . donepezil (ARICEPT) 5 MG tablet TAKE 1 TABLET BY MOUTH EVERYDAY AT BEDTIME  . furosemide (LASIX) 20 MG tablet TAKE 1 TABLET BY MOUTH EVERY OTHER DAY  . gabapentin (NEURONTIN) 100 MG capsule TAKE 1 CAPSULE BY MOUTH THREE TIMES DAILY  . metoprolol tartrate (LOPRESSOR) 25 MG tablet Take 1 tablet (25 mg total) by mouth 2 (two) times daily.  . Multiple  Vitamin (MULTIVITAMIN) capsule Take 1 capsule by mouth daily. Contains iron  . nitroGLYCERIN (NITROSTAT) 0.4 MG SL tablet PLACE ONE TABLET UNDER THE TONGUE EVERY 5 MINUTES AS NEEDED FOR CHEST PAIN.  Marland Kitchen oxybutynin (DITROPAN) 5 MG tablet TAKE 1 TABLET BY MOUTH TWICE A DAY  . pantoprazole (PROTONIX) 40 MG tablet TAKE 1 TABLET BY MOUTH TWICE A DAY  . polyethylene glycol (MIRALAX / GLYCOLAX) packet Take 17 g by mouth daily as needed for mild constipation.   . Potassium Chloride ER 20 MEQ TBCR Take 2 tablets by mouth in the morning.  . rosuvastatin (CRESTOR) 40 MG tablet Take 1 tablet (40 mg total) by mouth daily.  . traMADol (ULTRAM) 50 MG tablet TAKE 1-2 TABLETS THREE TIMES DAILY.  . vitamin C (ASCORBIC ACID) 500 MG tablet Take 500 mg by mouth daily.   No facility-administered encounter medications on file as of 12/18/2019.     Objective:  No results found for: HGBA1C Lab Results  Component Value Date   MICROALBUR 150 12/05/2018   LDLCALC 93 12/11/2019   CREATININE 1.04 (H) 12/11/2019   BP Readings from Last 3 Encounters:  12/11/19 (!) 130/74  12/11/19 (!) 130/74  12/08/19 113/71    Goals Addressed    . "to keep her dementia under good control"   On track    Son stated Sara Fox (see longitudinal plan of care for additional care plan information)  Current Barriers:  Marland Kitchen Knowledge Deficits related to disease process and Self Health management of  dementia  . Chronic Disease Management support and education needs related to Stage 3a chronic kidney disease, HTN, chronic systolic heart failure, Mild Cognitive Impairment, Atherosclerotic heart disease of native coronary artery with other forms of angina pectoris  Nurse Case Manager Clinical Goal(s):  Marland Kitchen Over the next 90 days, patient will work with the [place] and PCP to address needs related to disease education and support to improve Self Health management of dementia  CCM RN CM Interventions:  0805/21 call completed with patient's  daughter Sara Fox . Inter-disciplinary care team collaboration (see longitudinal plan of care) . Evaluation of current treatment plan related to dementia and patient's adherence to plan as established by provider. . Provided education to patient re: disease process for dementia and what to expect with disease progression  . Determined patient lives with her son Sara Fox who is her primary caregiver, her daughter Sara Fox is very involved in her care and makes routine visits to see her mother . Determined son Sara Fox is assisting with Sara Fox's medication administration although, recently the patient turned over the medication cups prepared by Sara Fox and the patient did not take her full dose of meds prescribed . Discussed the family would like to consult with the embedded Pharm D for assistance with medication management  . Determined patient's dementia is stable with occasional symptoms suggestive of "sundowners" described by daughter  . Discussed the family would like resources related to adult daycare options so the patient may get engaged socially, Sara Fox would also like to learn more about home delivered meals  . Sent in basket message to embedded BSW Sara Fox requesting resources for home delivered meals and adult daycare options  . Discussed plans with patient for ongoing care management follow up and provided patient with direct contact information for care management team  Patient Self Care Activities:  . Attends all scheduled provider appointments . Calls provider office for new concerns or questions . Supportive family to assist with patient care needs  Initial goal documentation and Please see past updates related to this goal by clicking on the "Past Updates" button in the selected goal        Plan:   Telephone follow up appointment with care management team member scheduled for: 03/11/20  Barb Merino, RN, BSN, CCM Care Management Coordinator [place] [facility]  Direct Phone: [phone]

## 2019-12-19 NOTE — Patient Instructions (Signed)
Visit Information  Goals Addressed    . "to keep her dementia under good control"   On track    Son stated [place] (see longitudinal plan of care for additional care plan information)  Current Barriers:  Marland Kitchen Knowledge Deficits related to disease process and Self Health management of dementia  . Chronic Disease Management support and education needs related to Stage 3a chronic kidney disease, HTN, chronic systolic heart failure, Mild Cognitive Impairment, Atherosclerotic heart disease of native coronary artery with other forms of angina pectoris  Nurse Case Manager Clinical Goal(s):  Marland Kitchen Over the next 90 days, patient will work with the Home and PCP to address needs related to disease education and support to improve Self Health management of dementia  CCM RN CM Interventions:  0805/21 call completed with patient's daughter [name] . Inter-disciplinary care team collaboration (see longitudinal plan of care) . Evaluation of current treatment plan related to dementia and patient's adherence to plan as established by provider. . Provided education to patient re: disease process for dementia and what to expect with disease progression  . Determined patient lives with her son [name] who is her primary caregiver, her daughter [name] is very involved in her care and makes routine visits to see her mother . Determined son [name] is assisting with Ms. Patino's medication administration although, recently the patient turned over the medication cups prepared by [name] and the patient did not take her full dose of meds prescribed . Discussed the family would like to consult with the embedded Pharm D for assistance with medication management  . Determined patient's dementia is stable with occasional symptoms suggestive of "sundowners" described by daughter  . Discussed the family would like resources related to adult daycare options so the patient may get engaged socially, [name] would also like to  learn more about home delivered meals  . Sent in basket message to embedded BSW [name] requesting resources for home delivered meals and adult daycare options  . Discussed plans with patient for ongoing care management follow up and provided patient with direct contact information for care management team  Patient Self Care Activities:  . Attends all scheduled provider appointments . Calls provider office for new concerns or questions . Supportive family to assist with patient care needs  Initial goal documentation and Please see past updates related to this goal by clicking on the "Past Updates" button in the selected goal       Patient verbalizes understanding of instructions provided today.   Telephone follow up appointment with care management team member scheduled for: 03/11/20  Barb Merino, RN, BSN, CCM Care Management Coordinator [place] [facility]  Direct Phone: [phone]

## 2019-12-22 ENCOUNTER — Other Ambulatory Visit: Payer: Self-pay

## 2019-12-22 ENCOUNTER — Telehealth: Payer: Self-pay

## 2019-12-22 MED ORDER — METOPROLOL TARTRATE 25 MG PO TABS: 25.0000 mg | ORAL_TABLET | Freq: Two times a day (BID) | ORAL | 3 refills | Status: DC

## 2019-12-22 NOTE — Telephone Encounter (Signed)
LVM to call the office for Lab results

## 2019-12-25 ENCOUNTER — Ambulatory Visit: Payer: Medicare Other | Admitting: Sports Medicine

## 2019-12-25 ENCOUNTER — Other Ambulatory Visit: Payer: Self-pay

## 2019-12-25 ENCOUNTER — Encounter: Payer: Self-pay | Admitting: Sports Medicine

## 2019-12-25 DIAGNOSIS — M79675 Pain in left toe(s): Secondary | ICD-10-CM | POA: Diagnosis not present

## 2019-12-25 DIAGNOSIS — M79674 Pain in right toe(s): Secondary | ICD-10-CM | POA: Diagnosis not present

## 2019-12-25 DIAGNOSIS — B351 Tinea unguium: Secondary | ICD-10-CM | POA: Diagnosis not present

## 2019-12-25 DIAGNOSIS — L84 Corns and callosities: Secondary | ICD-10-CM

## 2019-12-25 NOTE — Progress Notes (Signed)
Subjective: Sara Fox is a 79 y.o. female patient seen today in office with complaint of mildly painful corn and thickened and elongated toenails; unable to trim. Patient denies changes with medical history since last encounter but does admits some soreness to her corns and wants new caps. Patient has no other pedal complaints at this time.   Patient Active Problem List   Diagnosis Date Noted  . Fatigue 02/18/2019  . Memory loss 02/18/2019  . Vitamin B12 deficiency 10/23/2018  . Hypotension 10/23/2018  . Stage 3 chronic kidney disease 06/03/2018  . Chronic anemia 06/03/2018  . Hypercholesterolemia 06/03/2018  . Malnutrition of moderate degree 07/12/2017  . Hypoalbuminemia 07/11/2017  . Elevated LFTs 07/11/2017  . Closed fracture of left distal radius 05/28/2017  . Snoring 12/21/2016  . OSA and COPD overlap syndrome ([place]) 12/21/2016  . Peripheral musculoskeletal gait disorder 02/05/2015  . Lumbar post-laminectomy syndrome 02/05/2015  . CAD in native artery 10/16/2014  . NSTEMI (non-ST elevated myocardial infarction) ([place]) 09/13/2014  . Cardiomyopathy, ischemic 09/13/2014  . Chronic systolic heart failure ([place])   . Tobacco abuse 09/12/2014  . Dyslipidemia 09/12/2014  . CKD (chronic kidney disease), stage II 09/12/2014  . Normocytic anemia 09/12/2014  . Chronic diastolic heart failure, NYHA class 1 ([place]) 09/12/2014  . Postlaminectomy syndrome, cervical region 08/01/2013  . Chronic midline low back pain with left-sided sciatica 08/01/2013  . Shoulder joint contracture 08/01/2013  . HTN (hypertension) 03/03/2013  . Abnormal genetic test 03/03/2013    Current Outpatient Medications on File Prior to Visit  Medication Sig Dispense Refill  . aspirin EC 81 MG tablet Take 81 mg by mouth daily.    . budesonide-formoterol (SYMBICORT) 160-4.5 MCG/ACT inhaler Inhale 2 puffs into the lungs 2 (two) times daily. 3 Inhaler 1  . CALCIUM CITRATE PO Take 1 tablet by mouth daily.     .  Cholecalciferol (VITAMIN D3) 2000 UNITS capsule Take 2,000 Units by mouth daily.    . ciclopirox (PENLAC) 8 % solution Apply topically at bedtime. Apply over nail and surrounding skin. Apply daily over previous coat. After seven (7) days, file nail and continue cycle. 6.6 mL 0  . CVS MAGNESIUM OXIDE 250 MG TABS SMARTSIG:1 Tablet(s) By Mouth Every Evening    . donepezil (ARICEPT) 5 MG tablet TAKE 1 TABLET BY MOUTH EVERYDAY AT BEDTIME 90 tablet 1  . furosemide (LASIX) 20 MG tablet TAKE 1 TABLET BY MOUTH EVERY OTHER DAY 45 tablet 1  . gabapentin (NEURONTIN) 100 MG capsule TAKE 1 CAPSULE BY MOUTH THREE TIMES DAILY 270 capsule 0  . metoprolol tartrate (LOPRESSOR) 25 MG tablet Take 1 tablet (25 mg total) by mouth 2 (two) times daily. 90 tablet 3  . Multiple Vitamin (MULTIVITAMIN) capsule Take 1 capsule by mouth daily. Contains iron    . nitroGLYCERIN (NITROSTAT) 0.4 MG SL tablet PLACE ONE TABLET UNDER THE TONGUE EVERY 5 MINUTES AS NEEDED FOR CHEST PAIN. 50 tablet 0  . oxybutynin (DITROPAN) 5 MG tablet TAKE 1 TABLET BY MOUTH TWICE A DAY 180 tablet 1  . pantoprazole (PROTONIX) 40 MG tablet TAKE 1 TABLET BY MOUTH TWICE A DAY 180 tablet 1  . polyethylene glycol (MIRALAX / GLYCOLAX) packet Take 17 g by mouth daily as needed for mild constipation.     . Potassium Chloride ER 20 MEQ TBCR Take 2 tablets by mouth in the morning. 60 tablet 11  . rosuvastatin (CRESTOR) 40 MG tablet Take 1 tablet (40 mg total) by mouth daily. 30 tablet 8  .  traMADol (ULTRAM) 50 MG tablet TAKE 1-2 TABLETS THREE TIMES DAILY. 140 tablet 1  . vitamin C (ASCORBIC ACID) 500 MG tablet Take 500 mg by mouth daily.     No current facility-administered medications on file prior to visit.    Allergies  Allergen Reactions  . Buprenorphine Hcl Shortness Of Breath    Throat swelling/trouble breathing and lethargic  . Morphine And Related Shortness Of Breath    Throat swelling/trouble breathing and lethargic  . Celebrex [Celecoxib] Diarrhea  and Swelling    Swelling of legs   . Vioxx [Rofecoxib] Palpitations  . Codeine Other (See Comments)    Bloated     Objective: Physical Exam  General: Well developed, nourished, no acute distress, awake, alert and oriented x 3  Vascular: Dorsalis pedis artery 2/4 bilateral, Posterior tibial artery 2/4 bilateral, skin temperature warm to warm proximal to distal bilateral lower extremities, no varicosities, pedal hair present bilateral.  Neurological: Gross sensation present via light touch bilateral.   Dermatological: Skin is warm, dry, and supple bilateral, Nails 1-10 are tender, long, thick, and discolored with mild subungal debris, no webspace macerations present bilateral, no open lesions present bilateral, + corn/hyperkeratotic tissue present right 3rd toe. No signs of infection bilateral.  Musculoskeletal: Asymptomatic hammertoe boney deformities noted bilateral. Muscular strength within normal limits without painon range of motion. No pain with calf compression bilateral.  Assessment and Plan:  Problem List Items Addressed This Visit    None    Visit Diagnoses    Pain due to onychomycosis of toenails of both feet    -  Primary   Corns and callosities         -Examined patient.  -Re-Discussed treatment options for painful mycotic nails/corn. -Mechanically debrided and reduced mycotic nails with sterile nail nipper and dremel nail file without incident. -Dispensed toe caps  -At no charge trimmed corn at right 3rd toe distal tuft -Continue with Penlac to nails like before -Patient to return in 3 months for follow up evaluation or sooner if symptoms worsen.  Landis Martins, DPM

## 2019-12-26 ENCOUNTER — Ambulatory Visit: Payer: Medicare Other

## 2019-12-26 DIAGNOSIS — N1831 Chronic kidney disease, stage 3a: Secondary | ICD-10-CM | POA: Diagnosis not present

## 2019-12-26 DIAGNOSIS — I10 Essential (primary) hypertension: Secondary | ICD-10-CM | POA: Diagnosis not present

## 2019-12-26 DIAGNOSIS — G3184 Mild cognitive impairment, so stated: Secondary | ICD-10-CM

## 2019-12-26 DIAGNOSIS — I5022 Chronic systolic (congestive) heart failure: Secondary | ICD-10-CM | POA: Diagnosis not present

## 2019-12-26 DIAGNOSIS — I130 Hypertensive heart and chronic kidney disease with heart failure and stage 1 through stage 4 chronic kidney disease, or unspecified chronic kidney disease: Secondary | ICD-10-CM | POA: Diagnosis not present

## 2019-12-26 DIAGNOSIS — I25118 Atherosclerotic heart disease of native coronary artery with other forms of angina pectoris: Secondary | ICD-10-CM | POA: Diagnosis not present

## 2019-12-26 NOTE — Chronic Care Management (AMB) (Signed)
Chronic Care Management    Social Work Follow Up Note  12/26/2019 Name: Sara Fox MRN: [id number] DOB: [birth year]  Sara Fox is a 79 y.o. year old female who is a primary care patient of [name], [place]. The CCM team was consulted for assistance with care coordination.   Review of patient status, including review of consultants reports, other relevant assessments, and collaboration with appropriate care team members and the patient's provider was performed as part of comprehensive patient evaluation and provision of chronic care management services.    SDOH (Social Determinants of Health) assessments performed: No    Outpatient Encounter Medications as of 12/26/2019  Medication Sig  . aspirin EC 81 MG tablet Take 81 mg by mouth daily.  . budesonide-formoterol (SYMBICORT) 160-4.5 MCG/ACT inhaler Inhale 2 puffs into the lungs 2 (two) times daily.  Marland Kitchen CALCIUM CITRATE PO Take 1 tablet by mouth daily.   . Cholecalciferol (VITAMIN D3) 2000 UNITS capsule Take 2,000 Units by mouth daily.  . ciclopirox (PENLAC) 8 % solution Apply topically at bedtime. Apply over nail and surrounding skin. Apply daily over previous coat. After seven (7) days, file nail and continue cycle.  . CVS MAGNESIUM OXIDE 250 MG TABS SMARTSIG:1 Tablet(s) By Mouth Every Evening  . donepezil (ARICEPT) 5 MG tablet TAKE 1 TABLET BY MOUTH EVERYDAY AT BEDTIME  . furosemide (LASIX) 20 MG tablet TAKE 1 TABLET BY MOUTH EVERY OTHER DAY  . gabapentin (NEURONTIN) 100 MG capsule TAKE 1 CAPSULE BY MOUTH THREE TIMES DAILY  . metoprolol tartrate (LOPRESSOR) 25 MG tablet Take 1 tablet (25 mg total) by mouth 2 (two) times daily.  . Multiple Vitamin (MULTIVITAMIN) capsule Take 1 capsule by mouth daily. Contains iron  . nitroGLYCERIN (NITROSTAT) 0.4 MG SL tablet PLACE ONE TABLET UNDER THE TONGUE EVERY 5 MINUTES AS NEEDED FOR CHEST PAIN.  Marland Kitchen oxybutynin (DITROPAN) 5 MG tablet TAKE 1 TABLET BY MOUTH TWICE A DAY  . pantoprazole (PROTONIX)  40 MG tablet TAKE 1 TABLET BY MOUTH TWICE A DAY  . polyethylene glycol (MIRALAX / GLYCOLAX) packet Take 17 g by mouth daily as needed for mild constipation.   . Potassium Chloride ER 20 MEQ TBCR Take 2 tablets by mouth in the morning.  . rosuvastatin (CRESTOR) 40 MG tablet Take 1 tablet (40 mg total) by mouth daily.  . traMADol (ULTRAM) 50 MG tablet TAKE 1-2 TABLETS THREE TIMES DAILY.  . vitamin C (ASCORBIC ACID) 500 MG tablet Take 500 mg by mouth daily.   No facility-administered encounter medications on file as of 12/26/2019.     Goals Addressed            This Visit's Progress   . Collaborate with RN Case Manager to assist with care coordination needs       CARE PLAN ENTRY (see longtitudinal plan of care for additional care plan information)  Current Barriers:  . Social Isolation . Memory Deficits . HTN, CHF, CKD III, mild cognitive impairment  Social Work Clinical Goal(s):  Marland Kitchen Over the next 90 days, patient will work with care management team to address needs related to care coordination needs and caregiver asistance.  CCM SW Interventions: Completed 12/26/19 with the patients daughter, [name] . Collaboration with RN Care Manager who indicates patient daughter interest in adult day programs as well as mobile meals . Successful outbound call placed to the patients daughter [name] to assess patient care coordination needs . Determined [name] is interested in adult day programs for the patient other  than [facility] due to wanting to keep the patient current primary care provider  . Provided education surrounding programming opportunities at local senior centers as well as adult day program with [facility] reports she would like a full day program for the patient but is unsure of monetary ability to enroll with [facility] to speak with her family to discuss patient care needs and contact SW if a referral is needed . Provided  education on mobile meal program o Placed referral via [facility] to [facility] [place]  . SW will follow up with the patient over the next month as previously scheduled  Completed 12/15/19 with the patient and her son, [name] . Successful outbound call placed to the patient to follow up on goal progression . Reviewed the patient eligibility to enroll in [facility] with monthly co-pay amount of approximately $90 . Determined the patient does not feel like she can afford the cost at this time . Scheduled follow up call over the next month to assist with ongoing care coordination needs  Completed 11/12/19 . SW collaboration with [name] with [facility] requesting follow up on outcome of patient enrollment 4:30pm . Inbound call received from [name] stating patient was approved for [facility] enrollment with a $90 monthly co-pay o [name] spoke with the patient about this but has yet to receive contact with patient decision regarding enrollment . Successful outbound call placed to the patients daughter, [name] to discuss [facility] program and patient co-pay amount o [name] will touch base with patient and brother regarding family decision of enrollment  Patient Self Care Activities:  . Attends all scheduled provider appointments . Calls provider office for new concerns or questions . Supportive family to assist with patient care needs  Please see past updates related to this goal by clicking on the "Past Updates" button in the selected goal          Follow Up Plan: SW will follow up with patient by phone over the next month.   [name], BSW, CDP Social Worker, Certified Dementia Practitioner [place] [facility] [phone]  Total time spent performing care coordination and/or care management activities with the patient by phone or face to face = 20 minutes.

## 2019-12-26 NOTE — Patient Instructions (Signed)
Social Worker Visit Information  Goals we discussed today:  Goals Addressed            This Visit's Progress   . Collaborate with RN Case Manager to assist with care coordination needs       CARE PLAN ENTRY (see longtitudinal plan of care for additional care plan information)  Current Barriers:  . Social Isolation . Memory Deficits . HTN, CHF, CKD III, mild cognitive impairment  Social Work Clinical Goal(s):  Marland Kitchen Over the next 90 days, patient will work with care management team to address needs related to care coordination needs and caregiver asistance.  CCM SW Interventions: Completed 12/26/19 with the patients daughter, Sara Fox . Collaboration with RN Care Manager who indicates patient daughter interest in adult day programs as well as mobile meals . Successful outbound call placed to the patients daughter Sara Fox to assess patient care coordination needs . Determined Sara Fox is interested in adult day programs for the patient other than [facility] due to wanting to keep the patient current primary care provider  . Provided education surrounding programming opportunities at local senior centers as well as adult day program with [facility] reports she would like a full day program for the patient but is unsure of monetary ability to enroll with [facility] to speak with her family to discuss patient care needs and contact SW if a referral is needed . Provided education on mobile meal program o Placed referral via [facility] to [facility] [place]  . SW will follow up with the patient over the next month as previously scheduled  Completed 12/15/19 with the patient and her son, Sara Fox . Successful outbound call placed to the patient to follow up on goal progression . Reviewed the patient eligibility to enroll in [facility] with monthly co-pay amount of approximately $90 . Determined the patient does not feel like she can  afford the cost at this time . Scheduled follow up call over the next month to assist with ongoing care coordination needs  Completed 11/12/19 . SW collaboration with Sara Fox with [facility] requesting follow up on outcome of patient enrollment 4:30pm . Inbound call received from Sara Fox stating patient was approved for [facility] enrollment with a $90 monthly co-pay o [name] spoke with the patient about this but has yet to receive contact with patient decision regarding enrollment . Successful outbound call placed to the patients daughter, Sara Fox to discuss [facility] program and patient co-pay amount o Sara Fox will touch base with patient and brother regarding family decision of enrollment  Patient Self Care Activities:  . Attends all scheduled provider appointments . Calls provider office for new concerns or questions . Supportive family to assist with patient care needs  Please see past updates related to this goal by clicking on the "Past Updates" button in the selected goal          Materials Provided: Verbal education about community resources provided by phone  Follow Up Plan: SW will follow up with patient by phone over the next month.   Sara Fox, BSW, CDP Social Worker, Certified Dementia Practitioner [place] [facility] [place] [phone]

## 2020-01-01 ENCOUNTER — Encounter: Payer: Medicare Other | Attending: Physical Medicine & Rehabilitation | Admitting: Physical Medicine & Rehabilitation

## 2020-01-01 ENCOUNTER — Telehealth: Payer: Self-pay

## 2020-01-01 ENCOUNTER — Encounter: Payer: Self-pay | Admitting: Physical Medicine & Rehabilitation

## 2020-01-01 ENCOUNTER — Other Ambulatory Visit: Payer: Self-pay

## 2020-01-01 VITALS — BP 128/70 | HR 64 | Temp 98.1°F | Resp 14 | Ht 64.0 in | Wt 127.8 lb

## 2020-01-01 DIAGNOSIS — M5442 Lumbago with sciatica, left side: Secondary | ICD-10-CM

## 2020-01-01 DIAGNOSIS — M533 Sacrococcygeal disorders, not elsewhere classified: Secondary | ICD-10-CM | POA: Insufficient documentation

## 2020-01-01 DIAGNOSIS — G8929 Other chronic pain: Secondary | ICD-10-CM | POA: Insufficient documentation

## 2020-01-01 NOTE — Progress Notes (Signed)
Subjective:    Patient ID: Sara Fox, female    DOB: 06-13-1940, 79 y.o.   MRN: [id number] 79 year old female with history of cervical spine fusion with myelopathy status post C4 through C6 decompression and C3 C5 fusion, right shoulder hemiarthroplasty, chronic low back pain due to lumbar spinal stenosis, status post T LIF right L4-5 right L5-S1 and PL interbody fusion L4-5, L5-S1 08/01/2010 HPI 79 year old female with history of chronic low back pain as well as chronic cervical pain.    Taking tramadol  Pain Inventory Average Pain 5 Pain Right Now 2 My pain is intermittent and sharp  In the last 24 hours, has pain interfered with the following? General activity 0 Relation with others 0 Enjoyment of life 0 What TIME of day is your pain at its worst? varies Sleep (in general) Poor  Pain is worse with: some activites Pain improves with: medication Relief from Meds: 9  Family History  Problem Relation Age of Onset  . Heart disease Mother        also HTN  . Diabetes Mother   . Colon cancer Mother   . Heart disease Brother        deceased at 50  . Hypertension Brother   . Heart attack Brother   . Heart disease Brother   . Hypertension Brother    Social History   Socioeconomic History  . Marital status: Married    Spouse name: Not on file  . Number of children: 2  . Years of education: GED  . Highest education level: Not on file  Occupational History    Employer: RETIRED  Tobacco Use  . Smoking status: Current Every Day Smoker    Packs/day: 0.50    Years: 60.00    Pack years: 30.00  . Smokeless tobacco: Never Used  . Tobacco comment: currently smokes 1/2 ppd or less, she is using nicotine gum  Vaping Use  . Vaping Use: Former  Substance and Sexual Activity  . Alcohol use: No  . Drug use: No  . Sexual activity: Not Currently  Other Topics Concern  . Not on file  Social History Narrative  . Not on file   Social Determinants of Health   Financial  Resource Strain: Low Risk   . Difficulty of Paying Living Expenses: Not hard at all  Food Insecurity: No Food Insecurity  . Worried About Charity fundraiser in the Last Year: Never true  . Ran Out of Food in the Last Year: Never true  Transportation Needs: No Transportation Needs  . Lack of Transportation (Medical): No  . Lack of Transportation (Non-Medical): No  Physical Activity: Inactive  . Days of Exercise per Week: 0 days  . Minutes of Exercise per Session: 0 min  Stress: No Stress Concern Present  . Feeling of Stress : Not at all  Social Connections:   . Frequency of Communication with Friends and Family: Not on file  . Frequency of Social Gatherings with Friends and Family: Not on file  . Attends Religious Services: Not on file  . Active Member of Clubs or Organizations: Not on file  . Attends Archivist Meetings: Not on file  . Marital Status: Not on file   Past Surgical History:  Procedure Laterality Date  . ABDOMINAL HYSTERECTOMY    . BACK SURGERY  2012   lower back  . CARDIAC CATHETERIZATION  06/1999   noncritical disease invovling PDA  . CARDIAC CATHETERIZATION N/A 09/14/2014   Procedure:  Left Heart Cath and Coronary Angiography;  Surgeon: Leonie Man, MD;  Location: [facility] INVASIVE CV LAB CUPID;  Service: Cardiovascular;  Laterality: N/A;  . CHOLECYSTECTOMY    . ESOPHAGOGASTRODUODENOSCOPY (EGD) WITH PROPOFOL Left 07/19/2017   Procedure: ESOPHAGOGASTRODUODENOSCOPY (EGD) WITH PROPOFOL;  Surgeon: Ronnette Juniper, MD;  Location: [facility];  Service: Gastroenterology;  Laterality: Left;  . FRACTURE SURGERY Left 05/2017   left wrist  . HARDWARE REMOVAL Left 11/07/2017   Procedure: LEFT WRIST HARDWARE REMOVAL;  Surgeon: Charlotte Crumb, MD;  Location: [place];  Service: Orthopedics;  Laterality: Left;  . KNEE SURGERY Bilateral 2001 & 2007  . NECK SURGERY  2012   2012  . PARTIAL GASTRECTOMY  2005   subtotal  . PERCUTANEOUS CORONARY STENT INTERVENTION (PCI-S)   09/14/2014   Procedure: Percutaneous Coronary Stent Intervention (Pci-S);  Surgeon: Leonie Man, MD;  Location: [facility] INVASIVE CV LAB CUPID;  Service: Cardiovascular;;  . SHOULDER SURGERY Right   . TRANSTHORACIC ECHOCARDIOGRAM  06/02/2010   EF=>55%, normal LV systolic function; normal RV systolic function; mild mitral annular calcif; trace TR; AV mildly sclerotic  . WRIST OSTEOTOMY Left 11/07/2017   Procedure: LEFT WRIST DISTAL ULNA RESECTION WITH EXTENSOR CARPI ULNARIS STABILIZATION;  Surgeon: Charlotte Crumb, MD;  Location: [place];  Service: Orthopedics;  Laterality: Left;   Past Surgical History:  Procedure Laterality Date  . ABDOMINAL HYSTERECTOMY    . BACK SURGERY  2012   lower back  . CARDIAC CATHETERIZATION  06/1999   noncritical disease invovling PDA  . CARDIAC CATHETERIZATION N/A 09/14/2014   Procedure: Left Heart Cath and Coronary Angiography;  Surgeon: Leonie Man, MD;  Location: [facility] INVASIVE CV LAB CUPID;  Service: Cardiovascular;  Laterality: N/A;  . CHOLECYSTECTOMY    . ESOPHAGOGASTRODUODENOSCOPY (EGD) WITH PROPOFOL Left 07/19/2017   Procedure: ESOPHAGOGASTRODUODENOSCOPY (EGD) WITH PROPOFOL;  Surgeon: Ronnette Juniper, MD;  Location: [facility];  Service: Gastroenterology;  Laterality: Left;  . FRACTURE SURGERY Left 05/2017   left wrist  . HARDWARE REMOVAL Left 11/07/2017   Procedure: LEFT WRIST HARDWARE REMOVAL;  Surgeon: Charlotte Crumb, MD;  Location: [place];  Service: Orthopedics;  Laterality: Left;  . KNEE SURGERY Bilateral 2001 & 2007  . NECK SURGERY  2012   2012  . PARTIAL GASTRECTOMY  2005   subtotal  . PERCUTANEOUS CORONARY STENT INTERVENTION (PCI-S)  09/14/2014   Procedure: Percutaneous Coronary Stent Intervention (Pci-S);  Surgeon: Leonie Man, MD;  Location: [facility] INVASIVE CV LAB CUPID;  Service: Cardiovascular;;  . SHOULDER SURGERY Right   . TRANSTHORACIC ECHOCARDIOGRAM  06/02/2010   EF=>55%, normal LV systolic function; normal RV systolic function; mild mitral  annular calcif; trace TR; AV mildly sclerotic  . WRIST OSTEOTOMY Left 11/07/2017   Procedure: LEFT WRIST DISTAL ULNA RESECTION WITH EXTENSOR CARPI ULNARIS STABILIZATION;  Surgeon: Charlotte Crumb, MD;  Location: [place];  Service: Orthopedics;  Laterality: Left;   Past Medical History:  Diagnosis Date  . Acute kidney injury ([place])   . Back pain   . Cervical cancer ([place])   . CHF (congestive heart failure) ([place])   . COPD (chronic obstructive pulmonary disease) ([place])   . Coronary artery disease    s/p DES x2 to RCA 2016  . GERD (gastroesophageal reflux disease)   . Hyperlipidemia   . Hypertension   . Myocardial infarction ([place])   . Neck pain   . Rhabdomyolysis   . Stomach problems    BP 128/70   Pulse 64   Temp 98.1 F (36.7  C)   Resp 14   Ht 5\' 4"  (1.626 m)   Wt 127 lb 12.8 oz (58 kg)   BMI 21.94 kg/m   Opioid Risk Score:   Fall Risk Score:  `1  Depression screen PHQ 2/9  Depression screen [facility] 2/9 01/01/2020 12/11/2019 07/10/2019 06/30/2019 06/11/2019 04/24/2019 03/27/2019  Decreased Interest 0 0 0 0 0 0 0  Down, Depressed, Hopeless 0 0 0 0 0 0 0  PHQ - 2 Score 0 0 0 0 0 0 0  Altered sleeping - 0 - - - - -  Tired, decreased energy - 0 - - - - -  Change in appetite - 0 - - - - -  Feeling bad or failure about yourself  - 0 - - - - -  Trouble concentrating - 0 - - - - -  Moving slowly or fidgety/restless - 0 - - - - -  Suicidal thoughts - 0 - - - - -  PHQ-9 Score - 0 - - - - -  Difficult doing work/chores - Not difficult at all - - - - -  Some recent data might be hidden   Review of Systems  Constitutional: Negative.   HENT: Negative.   Eyes: Negative.   Respiratory: Negative.   Cardiovascular: Negative.   Gastrointestinal: Negative.   Endocrine: Negative.   Genitourinary: Negative.   Musculoskeletal: Positive for back pain.  Skin: Negative.   Allergic/Immunologic: Negative.   Neurological: Positive for numbness.       Tingling  Hematological: Negative.     Psychiatric/Behavioral: Negative.   All other systems reviewed and are negative.      Objective:   Physical Exam        Assessment & Plan:  #1.  Cervical postlaminectomy syndrome status post C3-4-5 fusion continue tramadol no evidence of radiculopathy 2.  Lumbar spinal stenosis history of interbody fusion posterior lateral L4-5 L5-S1, no evidence of lower extremity radiculopathy 3.  Chronic pain syndrome related to above functionally she is stable.  She has had one fall but this was an occurrence where she tripped on her dog.  No injury. Continue tramadol 100 mg 4 times daily and on 20 days months he is allowed to take an extra tablet in which case she is receiving 140 tablets/month.  No signs of side effects Follow-up with nurse practitioner in 22-month, UDS at that time

## 2020-01-01 NOTE — Chronic Care Management (AMB) (Signed)
Chronic Care Management Pharmacy Assistant   Name: Sara Fox  MRN: [id number] DOB: Jul 21, 1940  Reason for Encounter: Medication Review/ Initial Questions for initial Pharmacist visit on 01/05/20.  Patient Questions:  Have you seen any other providers since your last visit? **yes- 01/01/20- Dr Letta Pate (PT/Rehab), 8/2 & 8/13 - [name] (CCM Social Worker), 12/25/19- Dr Cannon Kettle (Podiatry), 12/18/2019- Angle Little (CCM Nurse).   PCP : [name], FNP  Allergies:   Allergies  Allergen Reactions  . Buprenorphine Hcl Shortness Of Breath    Throat swelling/trouble breathing and lethargic  . Morphine And Related Shortness Of Breath    Throat swelling/trouble breathing and lethargic  . Celebrex [Celecoxib] Diarrhea and Swelling    Swelling of legs   . Vioxx [Rofecoxib] Palpitations  . Codeine Other (See Comments)    Bloated     Medications: Outpatient Encounter Medications as of 01/01/2020  Medication Sig Note  . aspirin EC 81 MG tablet Take 81 mg by mouth daily.   . budesonide-formoterol (SYMBICORT) 160-4.5 MCG/ACT inhaler Inhale 2 puffs into the lungs 2 (two) times daily.   Marland Kitchen CALCIUM CITRATE PO Take 1 tablet by mouth daily.    . Cholecalciferol (VITAMIN D3) 2000 UNITS capsule Take 2,000 Units by mouth daily.   . ciclopirox (PENLAC) 8 % solution Apply topically at bedtime. Apply over nail and surrounding skin. Apply daily over previous coat. After seven (7) days, file nail and continue cycle.   . [facility] MAGNESIUM OXIDE 250 MG TABS SMARTSIG:1 Tablet(s) By Mouth Every Evening   . donepezil (ARICEPT) 5 MG tablet TAKE 1 TABLET BY MOUTH EVERYDAY AT BEDTIME   . furosemide (LASIX) 20 MG tablet TAKE 1 TABLET BY MOUTH EVERY OTHER DAY   . gabapentin (NEURONTIN) 100 MG capsule TAKE 1 CAPSULE BY MOUTH THREE TIMES DAILY   . metoprolol tartrate (LOPRESSOR) 25 MG tablet Take 1 tablet (25 mg total) by mouth 2 (two) times daily.   . Multiple Vitamin (MULTIVITAMIN) capsule Take 1 capsule by  mouth daily. Contains iron   . nitroGLYCERIN (NITROSTAT) 0.4 MG SL tablet PLACE ONE TABLET UNDER THE TONGUE EVERY 5 MINUTES AS NEEDED FOR CHEST PAIN.   Marland Kitchen oxybutynin (DITROPAN) 5 MG tablet TAKE 1 TABLET BY MOUTH TWICE A DAY   . pantoprazole (PROTONIX) 40 MG tablet TAKE 1 TABLET BY MOUTH TWICE A DAY   . polyethylene glycol (MIRALAX / GLYCOLAX) packet Take 17 g by mouth daily as needed for mild constipation.    . Potassium Chloride ER 20 MEQ TBCR Take 2 tablets by mouth in the morning.   . rosuvastatin (CRESTOR) 40 MG tablet Take 1 tablet (40 mg total) by mouth daily.   . traMADol (ULTRAM) 50 MG tablet TAKE 1-2 TABLETS THREE TIMES DAILY. 01/01/2020: Did not bring, reminded to do so each visit, per PMP filled 12/16/19 #140  last dose today  . vitamin C (ASCORBIC ACID) 500 MG tablet Take 500 mg by mouth daily.    No facility-administered encounter medications on file as of 01/01/2020.    Current Diagnosis: Patient Active Problem List   Diagnosis Date Noted  . Fatigue 02/18/2019  . Memory loss 02/18/2019  . Vitamin B12 deficiency 10/23/2018  . Hypotension 10/23/2018  . Stage 3 chronic kidney disease 06/03/2018  . Chronic anemia 06/03/2018  . Hypercholesterolemia 06/03/2018  . Malnutrition of moderate degree 07/12/2017  . Hypoalbuminemia 07/11/2017  . Elevated LFTs 07/11/2017  . Closed fracture of left distal radius 05/28/2017  . Snoring 12/21/2016  .  OSA and COPD overlap syndrome ([place]) 12/21/2016  . Peripheral musculoskeletal gait disorder 02/05/2015  . Lumbar post-laminectomy syndrome 02/05/2015  . CAD in native artery 10/16/2014  . NSTEMI (non-ST elevated myocardial infarction) ([place]) 09/13/2014  . Cardiomyopathy, ischemic 09/13/2014  . Chronic systolic heart failure ([place])   . Tobacco abuse 09/12/2014  . Dyslipidemia 09/12/2014  . CKD (chronic kidney disease), stage II 09/12/2014  . Normocytic anemia 09/12/2014  . Chronic diastolic heart failure, NYHA class 1 ([place]) 09/12/2014  .  Postlaminectomy syndrome, cervical region 08/01/2013  . Chronic midline low back pain with left-sided sciatica 08/01/2013  . Shoulder joint contracture 08/01/2013  . HTN (hypertension) 03/03/2013  . Abnormal genetic test 03/03/2013     Follow-Up:  Pharmacist Review- Called patient for Initial Questions for initial visit, no answer, left message to return call.  Pattricia Boss, [place] Pharmacist Assistant [phone]

## 2020-01-01 NOTE — Patient Instructions (Signed)
Keep up with housework  Don't trip on dog, take your time!

## 2020-01-05 ENCOUNTER — Other Ambulatory Visit: Payer: Self-pay

## 2020-01-05 ENCOUNTER — Ambulatory Visit: Payer: Medicare Other

## 2020-01-05 DIAGNOSIS — N1831 Chronic kidney disease, stage 3a: Secondary | ICD-10-CM | POA: Diagnosis not present

## 2020-01-05 DIAGNOSIS — I5022 Chronic systolic (congestive) heart failure: Secondary | ICD-10-CM

## 2020-01-05 DIAGNOSIS — I10 Essential (primary) hypertension: Secondary | ICD-10-CM | POA: Diagnosis not present

## 2020-01-05 DIAGNOSIS — I25118 Atherosclerotic heart disease of native coronary artery with other forms of angina pectoris: Secondary | ICD-10-CM | POA: Diagnosis not present

## 2020-01-05 DIAGNOSIS — I130 Hypertensive heart and chronic kidney disease with heart failure and stage 1 through stage 4 chronic kidney disease, or unspecified chronic kidney disease: Secondary | ICD-10-CM | POA: Diagnosis not present

## 2020-01-05 NOTE — Chronic Care Management (AMB) (Signed)
Chronic Care Management Pharmacy  Name: Sara Fox  MRN: [id number] DOB: June 24, 1940  Chief Complaint/ HPI   [name],  79 y.o. , female presents for their Initial CCM visit with the clinical pharmacist via telephone due to COVID-19 Pandemic. Call completed with patient's daughter, [name]. Bonita requested a list of patient's medication to make it easier for her to administer medication when her brother is not able to.  PCP : [name], FNP  Their chronic conditions include: Hypertension, Hyperlipidemia, Heart Failure, Chronic Kidney Disease and OSA and COPD overlap syndrome  Office Visits: 12/11/19 AWV and OV: Annual exam. Diet and exercise discussed. BP fairly controlled. Encouraged pt to quit smoking. Memory loss appears stable. Continue avoiding NSAIDs. Kidney function improving. Liver function normal. Hgb is improved.   11/06/19 OV: Overall doing well with cognitive impairment. HTN chronic and well controlled.   09/11/19 OV: Pt's son feels that memory has improved on donepezil. Fell 5-7 days ago, but is reporting no pain. No edema present at this time. Continue oxybutynin for urinary incontinence.   07/10/19 OV: Complaints of confusion, combativeness, and aggression. Pt up at night walking around the house. Abnormal behavior. Cognitive impairment and memory loss worsening since last UTI. Start on donepezil 5mg  at bedtime. Insomnia may be contributing to memory and behavior. Kidney function stable. Thyroid function normal Hgb dropped to 9.8, iron studies essentially normal. Urine culture negative.  Consult Visits: 01/01/20 Physical Medicine and Rehab OV: Continue tramadol. No evidence of radiculopathy.   12/25/19 Podiatry OV w/ Dr. Cannon Kettle: Mechanically debrided and reduced mycotic toenails. Trimmed corn at right third toe. Continue Penlac to nails. Return in 3 months.  12/08/19 Cardiology OV w/ Dr. Debara Pickett: Annual follow up. Has not required any nitroglycerin in past 6 months. BP  well controlled today. EKG sinus rhythm without any ischemia. Cholesterol has been well controlled. No medication changes today.   09/16/19 Podiatry OV w/ Dr. Cannon Kettle: Mechanically debrided and reduced mycotic toenails. Trimmed corn at right third toe. Continue Penlac to nails. Return in 3 months.  07/24/19 Nephrology OV w/ Dr. Posey Pronto: Note unavailable  07/09/19 Physical medicine and Rehab OV: Continue HEP as tolerated for cervical postlaminectomy syndrome. Continue to monitor. Refill tramadol 50mg  2 tablets tree times daily as needed.   CCM Encounters: 12/18/19 RN: pt education regarding dementia and disease progression  12/15/19 SW: Pt received PERS via mail and is wearing daily. Reviewed eligibility to enroll in [facility] with monthly copay of $80. Pt unable to afford at this time.   11/12/19 SW: Assist with obtaining personal emergency response system.   10/21/19 RN: Initial call with pt to establish care plan and review current treatment regimens. Pt education provided.   09/24/19 SW: Coordinated second dose of COVID vaccine to be received in home on 5/13  09/22/19 SW: Encouraged pt to schedule tour with [facility] to determine if she is interested  08/26/19 SW: Assisted pt with scheduling appt for COVID vaccine.   07/18/19 SW: Pt is improving with added medications and is less agitated. Mailed pt's son information on [employer] and [facility] programs (daytime programs).  Medications: Outpatient Encounter Medications as of 01/05/2020  Medication Sig Note  . aspirin EC 81 MG tablet Take 81 mg by mouth daily.   . budesonide-formoterol (SYMBICORT) 160-4.5 MCG/ACT inhaler Inhale 2 puffs into the lungs 2 (two) times daily.   Marland Kitchen CALCIUM CITRATE PO Take 1 tablet by mouth daily.    . Cholecalciferol (VITAMIN  D3) 2000 UNITS capsule Take 2,000 Units by mouth daily.   . ciclopirox (PENLAC) 8 % solution Apply topically at bedtime. Apply over nail and surrounding skin. Apply daily over previous  coat. After seven (7) days, file nail and continue cycle.   . [facility] MAGNESIUM OXIDE 250 MG TABS SMARTSIG:1 Tablet(s) By Mouth Every Evening   . donepezil (ARICEPT) 5 MG tablet TAKE 1 TABLET BY MOUTH EVERYDAY AT BEDTIME   . furosemide (LASIX) 20 MG tablet TAKE 1 TABLET BY MOUTH EVERY OTHER DAY   . gabapentin (NEURONTIN) 100 MG capsule TAKE 1 CAPSULE BY MOUTH THREE TIMES DAILY   . metoprolol tartrate (LOPRESSOR) 25 MG tablet Take 1 tablet (25 mg total) by mouth 2 (two) times daily.   . Multiple Vitamin (MULTIVITAMIN) capsule Take 1 capsule by mouth daily. Contains iron   . nitroGLYCERIN (NITROSTAT) 0.4 MG SL tablet PLACE ONE TABLET UNDER THE TONGUE EVERY 5 MINUTES AS NEEDED FOR CHEST PAIN.   Marland Kitchen oxybutynin (DITROPAN) 5 MG tablet TAKE 1 TABLET BY MOUTH TWICE A DAY   . pantoprazole (PROTONIX) 40 MG tablet TAKE 1 TABLET BY MOUTH TWICE A DAY   . polyethylene glycol (MIRALAX / GLYCOLAX) packet Take 17 g by mouth daily as needed for mild constipation.    . vitamin C (ASCORBIC ACID) 500 MG tablet Take 500 mg by mouth daily.   . [DISCONTINUED] Potassium Chloride ER 20 MEQ TBCR Take 2 tablets by mouth in the morning.   . [DISCONTINUED] rosuvastatin (CRESTOR) 40 MG tablet Take 1 tablet (40 mg total) by mouth daily.   . [DISCONTINUED] traMADol (ULTRAM) 50 MG tablet TAKE 1-2 TABLETS THREE TIMES DAILY. 01/01/2020: Did not bring, reminded to do so each visit, per PMP filled 12/16/19 #140  last dose today   No facility-administered encounter medications on file as of 01/05/2020.    Current Diagnosis/Assessment:    Goals Addressed            This Visit's Progress   . Medication Management        CARE PLAN ENTRY (see longitudinal plan of care for additional care plan information)  Current Barriers:  . Polypharmacy; complex patient with multiple comorbidities including Hypertension, Hyperlipidemia, Heart Failure, Chronic Kidney Disease and OSA and COPD overlap syndrome . Self-manages medications with help  from son, utilizing a pill box   Pharmacist Clinical Goal(s):  Marland Kitchen Over the next 45 days, patient will work with PharmD and provider towards optimized medication management  Interventions: . Comprehensive medication review performed; medication list updated in electronic medical record . Inter-disciplinary care team collaboration (see longitudinal plan of care) . Discussed benefits of adherence packaging to help with medication administration  Patient Self Care Activities:  . Patient will take medications as prescribed . Patient will focus on improved adherence by considering use of adherence packaging, medication synchronization, and delivery available with [facility]  Initial goal documentation        Medication Management   [facility] Uses pill box? Yes, but has dumped it out in the past Pt endorses unknown compliance  We discussed:  . Importance of taking each medications daily as directed . Will mail active medication list for patient's caregivers . Medication synchronization, adherence packaging, and delivery available through [facility].  o [name] is interested in this service for patient, but would like to speak with her brother about it first  Plan Continue current medication management strategy  Will discuss services available with [facility]  in detail Complete initial visit during next appointment  Follow up: 1 month phone visit  Jannette Fogo, PharmD Clinical Pharmacist [facility] [phone]

## 2020-01-07 ENCOUNTER — Other Ambulatory Visit: Payer: Self-pay | Admitting: Internal Medicine

## 2020-01-14 ENCOUNTER — Other Ambulatory Visit: Payer: Self-pay | Admitting: Internal Medicine

## 2020-01-15 ENCOUNTER — Other Ambulatory Visit: Payer: Self-pay | Admitting: Physical Medicine & Rehabilitation

## 2020-01-20 ENCOUNTER — Ambulatory Visit: Payer: Medicare Other

## 2020-01-20 DIAGNOSIS — N1831 Chronic kidney disease, stage 3a: Secondary | ICD-10-CM

## 2020-01-20 DIAGNOSIS — G3184 Mild cognitive impairment, so stated: Secondary | ICD-10-CM

## 2020-01-20 DIAGNOSIS — N189 Chronic kidney disease, unspecified: Secondary | ICD-10-CM | POA: Diagnosis not present

## 2020-01-20 DIAGNOSIS — N2581 Secondary hyperparathyroidism of renal origin: Secondary | ICD-10-CM | POA: Diagnosis not present

## 2020-01-20 DIAGNOSIS — I129 Hypertensive chronic kidney disease with stage 1 through stage 4 chronic kidney disease, or unspecified chronic kidney disease: Secondary | ICD-10-CM | POA: Diagnosis not present

## 2020-01-20 DIAGNOSIS — N183 Chronic kidney disease, stage 3 unspecified: Secondary | ICD-10-CM | POA: Diagnosis not present

## 2020-01-20 DIAGNOSIS — D631 Anemia in chronic kidney disease: Secondary | ICD-10-CM | POA: Diagnosis not present

## 2020-01-20 NOTE — Chronic Care Management (AMB) (Signed)
Chronic Care Management    Social Work Follow Up Note  01/20/2020 Name: Sara Fox MRN: [id number] DOB: [birth year]  Sara Fox is a 79 y.o. year old female who is a primary care patient of Sara Fox, [place]. The CCM team was consulted for assistance with care coordination.   Review of patient status, including review of consultants reports, other relevant assessments, and collaboration with appropriate care team members and the patient's provider was performed as part of comprehensive patient evaluation and provision of chronic care management services.    SDOH (Social Determinants of Health) assessments performed: No    Outpatient Encounter Medications as of 01/20/2020  Medication Sig  . aspirin EC 81 MG tablet Take 81 mg by mouth daily.  . budesonide-formoterol (SYMBICORT) 160-4.5 MCG/ACT inhaler Inhale 2 puffs into the lungs 2 (two) times daily.  Marland Kitchen CALCIUM CITRATE PO Take 1 tablet by mouth daily.   . Cholecalciferol (VITAMIN D3) 2000 UNITS capsule Take 2,000 Units by mouth daily.  . ciclopirox (PENLAC) 8 % solution Apply topically at bedtime. Apply over nail and surrounding skin. Apply daily over previous coat. After seven (7) days, file nail and continue cycle.  . [facility] MAGNESIUM OXIDE 250 MG TABS SMARTSIG:1 Tablet(s) By Mouth Every Evening  . donepezil (ARICEPT) 5 MG tablet TAKE 1 TABLET BY MOUTH EVERYDAY AT BEDTIME  . furosemide (LASIX) 20 MG tablet TAKE 1 TABLET BY MOUTH EVERY OTHER DAY  . gabapentin (NEURONTIN) 100 MG capsule TAKE 1 CAPSULE BY MOUTH THREE TIMES DAILY  . metoprolol tartrate (LOPRESSOR) 25 MG tablet Take 1 tablet (25 mg total) by mouth 2 (two) times daily.  . Multiple Vitamin (MULTIVITAMIN) capsule Take 1 capsule by mouth daily. Contains iron  . nitroGLYCERIN (NITROSTAT) 0.4 MG SL tablet PLACE ONE TABLET UNDER THE TONGUE EVERY 5 MINUTES AS NEEDED FOR CHEST PAIN.  Marland Kitchen oxybutynin (DITROPAN) 5 MG tablet TAKE 1 TABLET BY MOUTH TWICE A DAY  . pantoprazole (PROTONIX) 40  MG tablet TAKE 1 TABLET BY MOUTH TWICE A DAY  . polyethylene glycol (MIRALAX / GLYCOLAX) packet Take 17 g by mouth daily as needed for mild constipation.   . Potassium Chloride ER 20 MEQ TBCR TAKE 2 TABLETS BY MOUTH EVERY MORNING  . rosuvastatin (CRESTOR) 40 MG tablet TAKE 1 TABLET BY MOUTH ONCE DAILY  . traMADol (ULTRAM) 50 MG tablet TAKE 1-2 TABLETS BY MOUTH THREE TIMES DAILY.  . vitamin C (ASCORBIC ACID) 500 MG tablet Take 500 mg by mouth daily.   No facility-administered encounter medications on file as of 01/20/2020.     Goals Addressed            This Visit's Progress   . Collaborate with RN Case Manager to assist with care coordination needs       CARE PLAN ENTRY (see longtitudinal plan of care for additional care plan information)  Current Barriers:  . Social Isolation . Memory Deficits . HTN, CHF, CKD III, mild cognitive impairment  Social Work Clinical Goal(s):  Marland Kitchen Over the next 90 days, patient will work with care management team to address needs related to care coordination needs and caregiver asistance. . New 01/20/20 Over the next 60 days the patient and her son will work with primary care team to address concern surrounding sleep schedule  CCM SW Interventions: Completed 01/20/20 with the patient and her son Sara Fox . Successful outbound call placed to the patients son/caregiver Sara Fox to assess patient care coordination needs . Determined the patient is doing well  since starting Aricept as evidenced by decrease in agitation . Discussed the patient continues to struggle with a normal sleep routine o Patient stays up during the night rummaging . Sara Fox SW would collaborate with the patients primary care provider to determine if a medication is needed for intervention . Discussed lack of sleep with the patient who agrees with consulting her primary care provider for assistance . Collaboration with Sara Brine, FNP to request patient follow up . Collaboration with  Sara Fox to advise of plan  Completed 12/26/19 with the patients daughter, Sara Fox . Collaboration with RN Care Manager who indicates patient daughter interest in adult day programs as well as mobile meals . Successful outbound call placed to the patients daughter Sara Fox to assess patient care coordination needs . Determined Sara Fox is interested in adult day programs for the patient other than [facility] due to wanting to keep the patient current primary care provider  . Provided education surrounding programming opportunities at local senior centers as well as adult day program with [facility] reports she would like a full day program for the patient but is unsure of monetary ability to enroll with [facility] to speak with her family to discuss patient care needs and contact SW if a referral is needed . Provided education on mobile meal program o Placed referral via [facility] to [facility] [place]  . SW will follow up with the patient over the next month as previously scheduled  Completed 12/15/19 with the patient and her son, Sara Fox . Successful outbound call placed to the patient to follow up on goal progression . Reviewed the patient eligibility to enroll in [facility] with monthly co-pay amount of approximately $90 . Determined the patient does not feel like she can afford the cost at this time . Scheduled follow up call over the next month to assist with ongoing care coordination needs  Completed 11/12/19 . SW collaboration with Sara Fox with [facility] requesting follow up on outcome of patient enrollment 4:30pm . Inbound call received from Sara Fox stating patient was approved for [facility] enrollment with a $90 monthly co-pay o Sara Fox spoke with the patient about this but has yet to receive contact with patient decision regarding enrollment . Successful outbound call placed  to the patients daughter, Sara Fox to discuss [facility] program and patient co-pay amount o Sara Fox will touch base with patient and brother regarding family decision of enrollment  Patient Self Care Activities:  . Attends all scheduled provider appointments . Calls provider office for new concerns or questions . Supportive family to assist with patient care needs  Please see past updates related to this goal by clicking on the "Past Updates" button in the selected goal          Follow Up Plan: SW will follow up with patient by phone over the next 14 days.   [name], BSW, CDP Social Worker, Certified Dementia Practitioner [place] [facility] [place] [facility] [phone]  Total time spent performing care coordination and/or care management activities with the patient by phone or face to face = 10 minutes.

## 2020-01-20 NOTE — Patient Instructions (Signed)
Social Worker Visit Information  Goals we discussed today:  Goals Addressed            This Visit's Progress   . Collaborate with RN Case Manager to assist with care coordination needs       CARE PLAN ENTRY (see longtitudinal plan of care for additional care plan information)  Current Barriers:  . Social Isolation . Memory Deficits . HTN, CHF, CKD III, mild cognitive impairment  Social Work Clinical Goal(s):  Marland Kitchen Over the next 90 days, patient will work with care management team to address needs related to care coordination needs and caregiver asistance. . New 01/20/20 Over the next 60 days the patient and her son will work with primary care team to address concern surrounding sleep schedule  CCM SW Interventions: Completed 01/20/20 with the patient and her son [name] . Successful outbound call placed to the patients son/caregiver [name] to assess patient care coordination needs . Determined the patient is doing well since starting Aricept as evidenced by decrease in agitation . Discussed the patient continues to struggle with a normal sleep routine o Patient stays up during the night rummaging . [name] SW would collaborate with the patients primary care provider to determine if a medication is needed for intervention . Discussed lack of sleep with the patient who agrees with consulting her primary care provider for assistance . Collaboration with [name], FNP to request patient follow up . Collaboration with [place] to advise of plan  Completed 12/26/19 with the patients daughter, [name] . Collaboration with RN Care Manager who indicates patient daughter interest in adult day programs as well as mobile meals . Successful outbound call placed to the patients daughter [name] to assess patient care coordination needs . Determined [name] is interested in adult day programs for the patient other than [facility] due to wanting to keep the patient  current primary care provider  . Provided education surrounding programming opportunities at local senior centers as well as adult day program with [facility] reports she would like a full day program for the patient but is unsure of monetary ability to enroll with [facility] to speak with her family to discuss patient care needs and contact SW if a referral is needed . Provided education on mobile meal program o Placed referral via [facility] to [facility] [place]  . SW will follow up with the patient over the next month as previously scheduled  Completed 12/15/19 with the patient and her son, [name] . Successful outbound call placed to the patient to follow up on goal progression . Reviewed the patient eligibility to enroll in [facility] with monthly co-pay amount of approximately $90 . Determined the patient does not feel like she can afford the cost at this time . Scheduled follow up call over the next month to assist with ongoing care coordination needs  Completed 11/12/19 . SW collaboration with [name] with [facility] requesting follow up on outcome of patient enrollment 4:30pm . Inbound call received from [name] stating patient was approved for [facility] enrollment with a $90 monthly co-pay o [name] spoke with the patient about this but has yet to receive contact with patient decision regarding enrollment . Successful outbound call placed to the patients daughter, [name] to discuss [facility] program and patient co-pay amount o [name] will touch base with patient and brother  regarding family decision of enrollment  Patient Self Care Activities:  . Attends all scheduled provider appointments . Calls provider office for new concerns or questions . Supportive family to assist with patient care needs  Please see past updates related to this goal by clicking on the "Past Updates" button in the  selected goal         Follow Up Plan: SW will follow up with patient by phone over the next 14 days.   [name], BSW, CDP Social Worker, Certified Dementia Practitioner [place] [facility] [place] [facility] [phone]

## 2020-01-23 NOTE — Patient Instructions (Addendum)
Visit Information  Goals Addressed            This Visit's Progress   . Medication Management        CARE PLAN ENTRY (see longitudinal plan of care for additional care plan information)  Current Barriers:  . Polypharmacy; complex patient with multiple comorbidities including Hypertension, Hyperlipidemia, Heart Failure, Chronic Kidney Disease and OSA and COPD overlap syndrome . Self-manages medications with help from son, utilizing a pill box   Pharmacist Clinical Goal(s):  Marland Kitchen Over the next 45 days, patient will work with PharmD and provider towards optimized medication management  Interventions: . Comprehensive medication review performed; medication list updated in electronic medical record . Inter-disciplinary care team collaboration (see longitudinal plan of care) . Discussed benefits of adherence packaging to help with medication administration  Patient Self Care Activities:  . Patient will take medications as prescribed . Patient will focus on improved adherence by considering use of adherence packaging, medication synchronization, and delivery available with [facility]  Initial goal documentation        Sara Fox was given information about Chronic Care Management services today including:  1. CCM service includes personalized support from designated clinical staff supervised by her physician, including individualized plan of care and coordination with other care providers 2. 24/7 contact phone numbers for assistance for urgent and routine care needs. 3. Standard insurance, coinsurance, copays and deductibles apply for chronic care management only during months in which we provide at least 20 minutes of these services. Most insurances cover these services at 100%, however patients may be responsible for any copay, coinsurance and/or deductible if applicable. This service may help you avoid the need for more expensive face-to-face services. 4. Only one practitioner may  furnish and bill the service in a calendar month. 5. The patient may stop CCM services at any time (effective at the end of the month) by phone call to the office staff.  Patient agreed to services and verbal consent obtained.   The patient verbalized understanding of instructions provided today and agreed to receive a mailed copy of patient instruction and/or educational materials. Telephone follow up appointment with pharmacy team member scheduled for: 01/30/20 @ 9:00 AM  Sara Fox, PharmD Clinical Pharmacist [facility] [phone]   DASH Eating Plan DASH stands for "Dietary Approaches to Stop Hypertension." The DASH eating plan is a healthy eating plan that has been shown to reduce high blood pressure (hypertension). It may also reduce your risk for type 2 diabetes, heart disease, and stroke. The DASH eating plan may also help with weight loss. What are tips for following this plan?  General guidelines  Avoid eating more than 2,300 mg (milligrams) of salt (sodium) a day. If you have hypertension, you may need to reduce your sodium intake to 1,500 mg a day.  Limit alcohol intake to no more than 1 drink a day for nonpregnant women and 2 drinks a day for men. One drink equals 12 oz of beer, 5 oz of wine, or 1 oz of hard liquor.  Work with your health care provider to maintain a healthy body weight or to lose weight. Ask what an ideal weight is for you.  Get at least 30 minutes of exercise that causes your heart to beat faster (aerobic exercise) most days of the week. Activities may include walking, swimming, or biking.  Work with your health care provider or diet and nutrition specialist (dietitian) to adjust your eating plan to your individual calorie  needs. Reading food labels   Check food labels for the amount of sodium per serving. Choose foods with less than 5 percent of the Daily Value of sodium. Generally, foods with less than 300 mg of sodium  per serving fit into this eating plan.  To find whole grains, look for the word "whole" as the first word in the ingredient list. Shopping  Buy products labeled as "low-sodium" or "no salt added."  Buy fresh foods. Avoid canned foods and premade or frozen meals. Cooking  Avoid adding salt when cooking. Use salt-free seasonings or herbs instead of table salt or sea salt. Check with your health care provider or pharmacist before using salt substitutes.  Do not fry foods. Cook foods using healthy methods such as baking, boiling, grilling, and broiling instead.  Cook with heart-healthy oils, such as olive, canola, soybean, or sunflower oil. Meal planning  Eat a balanced diet that includes: ? 5 or more servings of fruits and vegetables each day. At each meal, try to fill half of your plate with fruits and vegetables. ? Up to 6-8 servings of whole grains each day. ? Less than 6 oz of lean meat, poultry, or fish each day. A 3-oz serving of meat is about the same size as a deck of cards. One egg equals 1 oz. ? 2 servings of low-fat dairy each day. ? A serving of nuts, seeds, or beans 5 times each week. ? Heart-healthy fats. Healthy fats called Omega-3 fatty acids are found in foods such as flaxseeds and coldwater fish, like sardines, salmon, and mackerel.  Limit how much you eat of the following: ? Canned or prepackaged foods. ? Food that is high in trans fat, such as fried foods. ? Food that is high in saturated fat, such as fatty meat. ? Sweets, desserts, sugary drinks, and other foods with added sugar. ? Full-fat dairy products.  Do not salt foods before eating.  Try to eat at least 2 vegetarian meals each week.  Eat more home-cooked food and less restaurant, buffet, and fast food.  When eating at a restaurant, ask that your food be prepared with less salt or no salt, if possible. What foods are recommended? The items listed may not be a complete list. Talk with your dietitian  about what dietary choices are best for you. Grains Whole-grain or whole-wheat bread. Whole-grain or whole-wheat pasta. Brown rice. [place]. Bulgur. Whole-grain and low-sodium cereals. Pita bread. Low-fat, low-sodium crackers. Whole-wheat flour tortillas. Vegetables Fresh or frozen vegetables (raw, steamed, roasted, or grilled). Low-sodium or reduced-sodium tomato and vegetable juice. Low-sodium or reduced-sodium tomato sauce and tomato paste. Low-sodium or reduced-sodium canned vegetables. Fruits All fresh, dried, or frozen fruit. Canned fruit in natural juice (without added sugar). Meat and other protein foods Skinless chicken or [place]. Ground chicken or [place]. Pork with fat trimmed off. Fish and seafood. Egg whites. Dried beans, peas, or lentils. Unsalted nuts, nut butters, and seeds. Unsalted canned beans. Lean cuts of beef with fat trimmed off. Low-sodium, lean deli meat. Dairy Low-fat (1%) or fat-free (skim) milk. Fat-free, low-fat, or reduced-fat cheeses. Nonfat, low-sodium ricotta or cottage cheese. Low-fat or nonfat yogurt. Low-fat, low-sodium cheese. Fats and oils Soft margarine without trans fats. Vegetable oil. Low-fat, reduced-fat, or light mayonnaise and salad dressings (reduced-sodium). Canola, safflower, olive, soybean, and sunflower oils. Avocado. Seasoning and other foods Herbs. Spices. Seasoning mixes without salt. Unsalted popcorn and pretzels. Fat-free sweets. What foods are not recommended? The items listed may not be a complete  list. Talk with your dietitian about what dietary choices are best for you. Grains Baked goods made with fat, such as croissants, muffins, or some breads. Dry pasta or rice meal packs. Vegetables Creamed or fried vegetables. Vegetables in a cheese sauce. Regular canned vegetables (not low-sodium or reduced-sodium). Regular canned tomato sauce and paste (not low-sodium or reduced-sodium). Regular tomato and vegetable juice (not low-sodium or  reduced-sodium). Angie Fava. Olives. Fruits Canned fruit in a light or heavy syrup. Fried fruit. Fruit in cream or butter sauce. Meat and other protein foods Fatty cuts of meat. Ribs. Fried meat. Berniece Salines. Sausage. Bologna and other processed lunch meats. Salami. Fatback. Hotdogs. Bratwurst. Salted nuts and seeds. Canned beans with added salt. Canned or smoked fish. Whole eggs or egg yolks. Chicken or [place] with skin. Dairy Whole or 2% milk, cream, and half-and-half. Whole or full-fat cream cheese. Whole-fat or sweetened yogurt. Full-fat cheese. Nondairy creamers. Whipped toppings. Processed cheese and cheese spreads. Fats and oils Butter. Stick margarine. Lard. Shortening. Ghee. Bacon fat. Tropical oils, such as coconut, palm kernel, or palm oil. Seasoning and other foods Salted popcorn and pretzels. Onion salt, garlic salt, seasoned salt, table salt, and sea salt. Worcestershire sauce. Tartar sauce. Barbecue sauce. Teriyaki sauce. Soy sauce, including reduced-sodium. Steak sauce. Canned and packaged gravies. Fish sauce. Oyster sauce. Cocktail sauce. Horseradish that you find on the shelf. Ketchup. Mustard. Meat flavorings and tenderizers. Bouillon cubes. Hot sauce and Tabasco sauce. Premade or packaged marinades. Premade or packaged taco seasonings. Relishes. Regular salad dressings. Where to find more information:  National Heart, Lung, and [place]: https://wilson-eaton.com/  American Heart Association: www.heart.org Summary  The DASH eating plan is a healthy eating plan that has been shown to reduce high blood pressure (hypertension). It may also reduce your risk for type 2 diabetes, heart disease, and stroke.  With the DASH eating plan, you should limit salt (sodium) intake to 2,300 mg a day. If you have hypertension, you may need to reduce your sodium intake to 1,500 mg a day.  When on the DASH eating plan, aim to eat more fresh fruits and vegetables, whole grains, lean proteins, low-fat  dairy, and heart-healthy fats.  Work with your health care provider or diet and nutrition specialist (dietitian) to adjust your eating plan to your individual calorie needs. This information is not intended to replace advice given to you by your health care provider. Make sure you discuss any questions you have with your health care provider. Document Revised: 04/13/2017 Document Reviewed: 04/24/2016 Elsevier Patient Education  2020 [employer].

## 2020-01-29 NOTE — Chronic Care Management (AMB) (Deleted)
Chronic Care Management Pharmacy  Name: Sara Fox  MRN: [id number] DOB: [birth year]  Chief Complaint/ HPI   Sara Fox,  79 y.o. , female presents for their Follow-Up CCM visit with the clinical pharmacist via telephone due to COVID-19 Pandemic. Call completed with patient's daughter, Sara Fox. Bonita requested a list of patient's medication to make it easier for her to administer medication when her brother is not able to.  PCP : [name], FNP  Their chronic conditions include: Hypertension, Hyperlipidemia, Heart Failure, Chronic Kidney Disease and OSA and COPD overlap syndrome  Office Visits: 12/11/19 AWV and OV: Annual exam. Diet and exercise discussed. BP fairly controlled. Encouraged pt to quit smoking. Memory loss appears stable. Continue avoiding NSAIDs. Kidney function improving. Liver function normal. Hgb is improved.   11/06/19 OV: Overall doing well with cognitive impairment. HTN chronic and well controlled.   09/11/19 OV: Pt's son feels that memory has improved on donepezil. Fell 5-7 days ago, but is reporting no pain. No edema present at this time. Continue oxybutynin for urinary incontinence.   07/10/19 OV: Complaints of confusion, combativeness, and aggression. Pt up at night walking around the house. Abnormal behavior. Cognitive impairment and memory loss worsening since last UTI. Start on donepezil 26m at bedtime. Insomnia may be contributing to memory and behavior. Kidney function stable. Thyroid function normal Hgb dropped to 9.8, iron studies essentially normal. Urine culture negative.  Consult Visits: 01/01/20 Physical Medicine and Rehab OV: Continue tramadol. No evidence of radiculopathy.   12/25/19 Podiatry OV w/ Dr. SCannon Kettle Mechanically debrided and reduced mycotic toenails. Trimmed corn at right third toe. Continue Penlac to nails. Return in 3 months.  12/08/19 Cardiology OV w/ Dr. HDebara Pickett Annual follow up. Has not required any nitroglycerin in past 6 months.  BP well controlled today. EKG sinus rhythm without any ischemia. Cholesterol has been well controlled. No medication changes today.   09/16/19 Podiatry OV w/ Dr. SCannon Kettle Mechanically debrided and reduced mycotic toenails. Trimmed corn at right third toe. Continue Penlac to nails. Return in 3 months.  07/24/19 Nephrology OV w/ Dr. PPosey Pronto Note unavailable  07/09/19 Physical medicine and Rehab OV: Continue HEP as tolerated for cervical postlaminectomy syndrome. Continue to monitor. Refill tramadol 558m2 tablets tree times daily as needed.   CCM Encounters: 12/18/19 RN: pt education regarding dementia and disease progression  12/15/19 SW: Pt received PERS via mail and is wearing daily. Reviewed eligibility to enroll in [facility] with monthly copay of $80. Pt unable to afford at this time.   11/12/19 SW: Assist with obtaining personal emergency response system.   10/21/19 RN: Initial call with pt to establish care plan and review current treatment regimens. Pt education provided.   09/24/19 SW: Coordinated second dose of COVID vaccine to be received in home on 5/13  09/22/19 SW: Encouraged pt to schedule tour with [facility] to determine if she is interested  08/26/19 SW: Assisted pt with scheduling appt for COVID vaccine.   07/18/19 SW: Pt is improving with added medications and is less agitated. Mailed pt's son information on [employer] [facility] programs (daytime programs).  Medications: Outpatient Encounter Medications as of 01/30/2020  Medication Sig  . aspirin EC 81 MG tablet Take 81 mg by mouth daily.  . budesonide-formoterol (SYMBICORT) 160-4.5 MCG/ACT inhaler Inhale 2 puffs into the lungs 2 (two) times daily.  . Marland KitchenALCIUM CITRATE PO Take 1 tablet by mouth daily.   . Cholecalciferol (VITAMIN D3) 2000 UNITS capsule  Take 2,000 Units by mouth daily.  . ciclopirox (PENLAC) 8 % solution Apply topically at bedtime. Apply over nail and surrounding skin. Apply daily over previous coat.  After seven (7) days, file nail and continue cycle.  . [facility] MAGNESIUM OXIDE 250 MG TABS SMARTSIG:1 Tablet(s) By Mouth Every Evening  . donepezil (ARICEPT) 5 MG tablet TAKE 1 TABLET BY MOUTH EVERYDAY AT BEDTIME  . furosemide (LASIX) 20 MG tablet TAKE 1 TABLET BY MOUTH EVERY OTHER DAY  . gabapentin (NEURONTIN) 100 MG capsule TAKE 1 CAPSULE BY MOUTH THREE TIMES DAILY  . metoprolol tartrate (LOPRESSOR) 25 MG tablet Take 1 tablet (25 mg total) by mouth 2 (two) times daily.  . Multiple Vitamin (MULTIVITAMIN) capsule Take 1 capsule by mouth daily. Contains iron  . nitroGLYCERIN (NITROSTAT) 0.4 MG SL tablet PLACE ONE TABLET UNDER THE TONGUE EVERY 5 MINUTES AS NEEDED FOR CHEST PAIN.  Marland Kitchen oxybutynin (DITROPAN) 5 MG tablet TAKE 1 TABLET BY MOUTH TWICE A DAY  . pantoprazole (PROTONIX) 40 MG tablet TAKE 1 TABLET BY MOUTH TWICE A DAY  . polyethylene glycol (MIRALAX / GLYCOLAX) packet Take 17 g by mouth daily as needed for mild constipation.   . Potassium Chloride ER 20 MEQ TBCR TAKE 2 TABLETS BY MOUTH EVERY MORNING  . rosuvastatin (CRESTOR) 40 MG tablet TAKE 1 TABLET BY MOUTH ONCE DAILY  . traMADol (ULTRAM) 50 MG tablet TAKE 1-2 TABLETS BY MOUTH THREE TIMES DAILY.  . vitamin C (ASCORBIC ACID) 500 MG tablet Take 500 mg by mouth daily.   No facility-administered encounter medications on file as of 01/30/2020.    Current Diagnosis/Assessment:    Goals Addressed   None    Hypertension   BP goal is:  {CHL HP [facility] Pharmacist BP ranges:[id number]}  Office blood pressures are  BP Readings from Last 3 Encounters:  01/01/20 128/70  12/11/19 (!) 130/74  12/11/19 (!) 130/74   Patient checks BP at home {CHL HP BP Monitoring [phone]} Patient home BP readings are ranging: ***  Patient has failed these meds in the past: Lisinopril Patient is currently {CHL Controlled/Uncontrolled:[phone]} on the following medications:  Marland Kitchen Metoprolol tartrate 44m twice daily . Furosemide 259mevery other  day Potassium Cl Er 2060m2 tablets every morning  We discussed {CHL [facility] [phone]}  Plan Continue {CHL HP [facility] Plans:[phone]}   Hyperlipidemia   LDL goal < 100  Lipid Panel     Component Value Date/Time   CHOL 161 12/11/2019 1203   TRIG 118 12/11/2019 1203   HDL 47 12/11/2019 1203   LDLCALC 93 12/11/2019 1203    Hepatic Function Latest Ref Rng & Units 12/11/2019 06/11/2019 03/11/2019  Total Protein 6.0 - 8.5 g/dL 6.6 7.0 6.7  Albumin 3.7 - 4.7 g/dL 3.9 4.1 4.2  AST 0 - 40 IU/L _0 ALT 0 - 32 IU/L _1 Alk Phosphatase 48 - 121 IU/L 94 114 106  Total Bilirubin 0.0 - 1.2 mg/dL 0.4 0.4 0.2  Bilirubin, Direct 0.1 - 0.5 mg/dL - - -     The ASCVD Risk score (Gof[place]et al., 2013) failed to calculate for the following reasons:   The 2013 ASCVD risk score is only valid for ages 40 77 79 12The patient has a prior MI or stroke diagnosis   Patient has failed these meds in past: Lovastatin Patient is currently controlled on the following medications:  . Rosuvastatin 41m17mily  We discussed:  Plan Continue {CHL HP [facility] Plans:[id number]}   Heart Failure   Type: Systolic  Last ejection fraction: 55% NYHA Class: I (no actitivty limitation)  BP goal is:  <130/80 BP Readings from Last 3 Encounters:  01/01/20 128/70  12/11/19 (!) 130/74  12/11/19 (!) 130/74   Kidney Function Lab Results  Component Value Date/Time   CREATININE 1.04 (H) 12/11/2019 12:03 PM   CREATININE 1.38 (H) 07/10/2019 11:21 AM   GFRNONAA 51 (L) 12/11/2019 12:03 PM   GFRAA 59 (L) 12/11/2019 12:03 PM   K 4.5 12/11/2019 12:03 PM   K 4.4 07/10/2019 11:21 AM    Patient checks BP at home {CHL HP BP Monitoring [phone]} Patient home BP readings are ranging: ***  Patient has failed these meds in past: *** Patient is currently {CHL Controlled/[phone]} on the following medications:  Marland Kitchen Metoprolol tartrate  39m twice daily . Furosemide 281mevery other day . Potassium Cl Er 2098m2 tablets every morning  We discussed {CHL [facility] [phone]}  Plan  Continue {CHL HP [facility] Plans:[id number]}   GERD   Patient has failed these meds in past: *** Patient is currently {CHL Controlled/[phone]} on the following medications:  . Pantoprazole 47m24mice daily  We discussed:  ***  Plan Continue {CHL HP [facility] Plans:[id number]}   OSA and COPR Overlap Syndrome   Patient has failed these meds in past: *** Patient is currently {CHL Controlled/[phone]} on the following medications: ***  We discussed:  {CHL [facility] [phone]}  Plan  Continue {CHL HP [facility] Plans:[id number]}   Osteopenia / Osteoporosis   Last DEXA Scan: ***   T-Score femoral neck: ***  T-Score total hip: ***  T-Score lumbar spine: ***  T-Score forearm radius: ***  10-year probability of major osteoporotic fracture: ***  10-year probability of hip fracture: ***  No results found for: VD25OH   Patient {is;is not an osteoporosis candidate:23886}  Patient has failed these meds in past: *** Patient is currently {CHL Controlled/[phone]} on the following medications:  . ***  We discussed:  {Osteoporosis Counseling:23892}  Plan  Continue {CHL HP [facility] Plans:[id number]}     Medication Management   [facility] Uses pill box? Yes, but has dumped it out in the past Pt endorses unknown compliance  We discussed:  . Importance of taking each medications daily as directed . Will mail active medication list for patient's caregivers . Medication synchronization, adherence packaging, and delivery available through [facility].  o BoniDoroteo Bradfordinterested in this service for patient, but would like to speak with her brother about it  first  Plan Continue current medication management strategy  Will discuss services available with [facility] in detail Complete initial visit during next appointment  Follow up: 1 month phone visit  CourJannette FogoarmD Clinical Pharmacist [facility] [phone]

## 2020-01-30 ENCOUNTER — Telehealth: Payer: Self-pay

## 2020-02-02 ENCOUNTER — Telehealth: Payer: Medicare Other

## 2020-02-02 ENCOUNTER — Telehealth: Payer: Self-pay

## 2020-02-02 NOTE — Telephone Encounter (Signed)
  Chronic Care Management   Outreach Note  02/02/2020 Name: Sara Fox MRN: [id number] DOB: July 22, 1940  Referred by: [name], FNP Reason for referral : Care Coordination   An unsuccessful telephone outreach was attempted today. The patient was referred to the case management team for assistance with care management and care coordination.   Follow Up Plan: The care management team will reach out to the patient again over the next 10 days.   [name], BSW, CDP Social Worker, Certified Dementia Practitioner [place] [facility] [place] [facility] [phone]

## 2020-02-10 ENCOUNTER — Telehealth: Payer: Self-pay

## 2020-02-10 NOTE — Chronic Care Management (AMB) (Deleted)
Chronic Care Management Pharmacy  Name: Sara Fox  MRN: [id number] DOB: 09/19/1940  Chief Complaint/ HPI   Sara Fox,  79 y.o. , female presents for their Follow-Up CCM visit with the clinical pharmacist via telephone due to COVID-19 Pandemic. Call completed with patient's daughter, Sara Fox. Bonita requested a list of patient's medication to make it easier for her to administer medication when her brother is not able to.  PCP : [name], FNP  Their chronic conditions include: Hypertension, Hyperlipidemia, Heart Failure, Chronic Kidney Disease and OSA and COPD overlap syndrome  Office Visits: 12/11/19 AWV and OV: Annual exam. Diet and exercise discussed. BP fairly controlled. Encouraged pt to quit smoking. Memory loss appears stable. Continue avoiding NSAIDs. Kidney function improving. Liver function normal. Hgb is improved.   11/06/19 OV: Overall doing well with cognitive impairment. HTN chronic and well controlled.   09/11/19 OV: Pt's son feels that memory has improved on donepezil. Fell 5-7 days ago, but is reporting no pain. No edema present at this time. Continue oxybutynin for urinary incontinence.   07/10/19 OV: Complaints of confusion, combativeness, and aggression. Pt up at night walking around the house. Abnormal behavior. Cognitive impairment and memory loss worsening since last UTI. Start on donepezil 83m at bedtime. Insomnia may be contributing to memory and behavior. Kidney function stable. Thyroid function normal Hgb dropped to 9.8, iron studies essentially normal. Urine culture negative.  Consult Visits: 01/01/20 Physical Medicine and Rehab OV: Continue tramadol. No evidence of radiculopathy.   12/25/19 Podiatry OV w/ Dr. SCannon Kettle Mechanically debrided and reduced mycotic toenails. Trimmed corn at right third toe. Continue Penlac to nails. Return in 3 months.  12/08/19 Cardiology OV w/ Dr. HDebara Pickett Annual follow up. Has not required any nitroglycerin in past 6 months.  BP well controlled today. EKG sinus rhythm without any ischemia. Cholesterol has been well controlled. No medication changes today.   09/16/19 Podiatry OV w/ Dr. SCannon Kettle Mechanically debrided and reduced mycotic toenails. Trimmed corn at right third toe. Continue Penlac to nails. Return in 3 months.  07/24/19 Nephrology OV w/ Dr. PPosey Pronto Note unavailable  07/09/19 Physical medicine and Rehab OV: Continue HEP as tolerated for cervical postlaminectomy syndrome. Continue to monitor. Refill tramadol 512m2 tablets tree times daily as needed.   CCM Encounters: 12/18/19 RN: pt education regarding dementia and disease progression  12/15/19 SW: Pt received PERS via mail and is wearing daily. Reviewed eligibility to enroll in [facility] with monthly copay of $80. Pt unable to afford at this time.   11/12/19 SW: Assist with obtaining personal emergency response system.   10/21/19 RN: Initial call with pt to establish care plan and review current treatment regimens. Pt education provided.   09/24/19 SW: Coordinated second dose of COVID vaccine to be received in home on 5/13  09/22/19 SW: Encouraged pt to schedule tour with [facility] to determine if she is interested  08/26/19 SW: Assisted pt with scheduling appt for COVID vaccine.   07/18/19 SW: Pt is improving with added medications and is less agitated. Mailed pt's son information on [employer] [facility] programs (daytime programs).  Medications: Outpatient Encounter Medications as of 02/10/2020  Medication Sig  . aspirin EC 81 MG tablet Take 81 mg by mouth daily.  . budesonide-formoterol (SYMBICORT) 160-4.5 MCG/ACT inhaler Inhale 2 puffs into the lungs 2 (two) times daily.  . Marland KitchenALCIUM CITRATE PO Take 1 tablet by mouth daily.   . Cholecalciferol (VITAMIN D3) 2000 UNITS capsule  Take 2,000 Units by mouth daily.  . ciclopirox (PENLAC) 8 % solution Apply topically at bedtime. Apply over nail and surrounding skin. Apply daily over previous coat.  After seven (7) days, file nail and continue cycle.  . [facility] MAGNESIUM OXIDE 250 MG TABS SMARTSIG:1 Tablet(s) By Mouth Every Evening  . donepezil (ARICEPT) 5 MG tablet TAKE 1 TABLET BY MOUTH EVERYDAY AT BEDTIME  . furosemide (LASIX) 20 MG tablet TAKE 1 TABLET BY MOUTH EVERY OTHER DAY  . gabapentin (NEURONTIN) 100 MG capsule TAKE 1 CAPSULE BY MOUTH THREE TIMES DAILY  . metoprolol tartrate (LOPRESSOR) 25 MG tablet Take 1 tablet (25 mg total) by mouth 2 (two) times daily.  . Multiple Vitamin (MULTIVITAMIN) capsule Take 1 capsule by mouth daily. Contains iron  . nitroGLYCERIN (NITROSTAT) 0.4 MG SL tablet PLACE ONE TABLET UNDER THE TONGUE EVERY 5 MINUTES AS NEEDED FOR CHEST PAIN.  Marland Kitchen oxybutynin (DITROPAN) 5 MG tablet TAKE 1 TABLET BY MOUTH TWICE A DAY  . pantoprazole (PROTONIX) 40 MG tablet TAKE 1 TABLET BY MOUTH TWICE A DAY  . polyethylene glycol (MIRALAX / GLYCOLAX) packet Take 17 g by mouth daily as needed for mild constipation.   . Potassium Chloride ER 20 MEQ TBCR TAKE 2 TABLETS BY MOUTH EVERY MORNING  . rosuvastatin (CRESTOR) 40 MG tablet TAKE 1 TABLET BY MOUTH ONCE DAILY  . traMADol (ULTRAM) 50 MG tablet TAKE 1-2 TABLETS BY MOUTH THREE TIMES DAILY.  . vitamin C (ASCORBIC ACID) 500 MG tablet Take 500 mg by mouth daily.   No facility-administered encounter medications on file as of 02/10/2020.    Current Diagnosis/Assessment:    Goals Addressed   None    Hypertension   BP goal is:  {CHL HP [facility] Pharmacist BP [phone]}  Office blood pressures are  BP Readings from Last 3 Encounters:  01/01/20 128/70  12/11/19 (!) 130/74  12/11/19 (!) 130/74   Patient checks BP at home {CHL HP BP Monitoring Frequency:[phone]} Patient home BP readings are ranging: ***  Patient has failed these meds in the past: Lisinopril Patient is currently {CHL Controlled/Uncontrolled:4805413461} on the following medications:  Marland Kitchen Metoprolol tartrate 30m twice daily . Furosemide 252mevery other  day  Potassium Cl Er 2079m2 tablets every morning  We discussed {CHL [facility] Pharmacy discussion:[phone]}  Plan Continue {CHL HP [facility] Pharmacy Plans:[phone]}   Hyperlipidemia   LDL goal < 100  Lipid Panel     Component Value Date/Time   CHOL 161 12/11/2019 1203   TRIG 118 12/11/2019 1203   HDL 47 12/11/2019 1203   LDLCALC 93 12/11/2019 1203    Hepatic Function Latest Ref Rng & Units 12/11/2019 06/11/2019 03/11/2019  Total Protein 6.0 - 8.5 g/dL 6.6 7.0 6.7  Albumin 3.7 - 4.7 g/dL 3.9 4.1 4.2  AST 0 - 40 IU/L _0 ALT 0 - 32 IU/L _1 Alk Phosphatase 48 - 121 IU/L 94 114 106  Total Bilirubin 0.0 - 1.2 mg/dL 0.4 0.4 0.2  Bilirubin, Direct 0.1 - 0.5 mg/dL - - -     The ASCVD Risk score (GofKewaneeet al., 2013) failed to calculate for the following reasons:   The 2013 ASCVD risk score is only valid for ages 40 73 79 57The patient has a prior MI or stroke diagnosis   Patient has failed these meds in past: Lovastatin Patient is currently controlled on the following medications:  . Rosuvastatin 30m10mily  We discussed:  Plan Continue {CHL HP [facility] Pharmacy Plans:8150328463}   Heart Failure   Type: Systolic  Last ejection fraction: 55% NYHA Class: I (no actitivty limitation)  BP goal is:  <130/80 BP Readings from Last 3 Encounters:  01/01/20 128/70  12/11/19 (!) 130/74  12/11/19 (!) 130/74   Kidney Function Lab Results  Component Value Date/Time   CREATININE 1.04 (H) 12/11/2019 12:03 PM   CREATININE 1.38 (H) 07/10/2019 11:21 AM   GFRNONAA 51 (L) 12/11/2019 12:03 PM   GFRAA 59 (L) 12/11/2019 12:03 PM   K 4.5 12/11/2019 12:03 PM   K 4.4 07/10/2019 11:21 AM    Patient checks BP at home {CHL HP BP Monitoring [phone]} Patient home BP readings are ranging: ***  Patient has failed these meds in past: *** Patient is currently {CHL Controlled/Uncontrolled:[phone]} on the following medications:  Marland Kitchen Metoprolol  tartrate 2m twice daily . Furosemide 251mevery other day . Potassium Cl Er 2058m2 tablets every morning  We discussed {CHL [facility] Pharmacy discussion:[phone]}  Plan  Continue {CHL HP [facility] Pharmacy Plans:8150328463}   GERD   Patient has failed these meds in past: *** Patient is currently {CHL Controlled/Uncontrolled:[phone]} on the following medications:  . Pantoprazole 41m34mice daily  We discussed:  ***  Plan Continue {CHL HP [facility] Pharmacy Plans:8150328463}   OSA and COPR Overlap Syndrome   Patient has failed these meds in past: *** Patient is currently {CHL Controlled/Uncontrolled:[phone]} on the following medications: ***  We discussed:  {CHL [facility] Pharmacy discussion:[phone]}  Plan  Continue {CHL HP [facility] Pharmacy Plans:8150328463}   Osteopenia / Osteoporosis   Last DEXA Scan: ***   T-Score femoral neck: ***  T-Score total hip: ***  T-Score lumbar spine: ***  T-Score forearm radius: ***  10-year probability of major osteoporotic fracture: ***  10-year probability of hip fracture: ***  No results found for: VD25OH   Patient {is;is not an osteoporosis candidate:23886}  Patient has failed these meds in past: *** Patient is currently {CHL Controlled/Uncontrolled:[phone]} on the following medications:  . ***  We discussed:  {Osteoporosis Counseling:23892}  Plan  Continue {CHL HP [facility] Pharmacy Plans:8150328463}     Medication Management   [facility] Uses pill box? Yes, but has dumped it out in the past Pt endorses unknown compliance  We discussed:  . Importance of taking each medications daily as directed . Will mail active medication list for patient's caregivers . Medication synchronization, adherence packaging, and delivery available through [facility].  o BoniDoroteo Bradfordinterested in this service for patient, but would like to speak with her brother about it  first  Plan Continue current medication management strategy  Will discuss services available with [facility] in detail Complete initial visit during next appointment  Follow up: 1 month phone visit  CourJannette FogoarmD Clinical Pharmacist [facility] [phone]

## 2020-02-11 ENCOUNTER — Ambulatory Visit: Payer: Medicare Other

## 2020-02-11 DIAGNOSIS — G3184 Mild cognitive impairment, so stated: Secondary | ICD-10-CM

## 2020-02-11 DIAGNOSIS — I10 Essential (primary) hypertension: Secondary | ICD-10-CM

## 2020-02-11 DIAGNOSIS — N1831 Chronic kidney disease, stage 3a: Secondary | ICD-10-CM

## 2020-02-11 NOTE — Patient Instructions (Signed)
Social Worker Visit Information  Goals we discussed today:  Goals Addressed            This Visit's Progress   . Collaborate with RN Case Manager to assist with care coordination needs       CARE PLAN ENTRY (see longtitudinal plan of care for additional care plan information)  Current Barriers:  . Social Isolation . Memory Deficits . HTN, CHF, CKD III, mild cognitive impairment  Social Work Clinical Goal(s):  Marland Kitchen Over the next 90 days, patient will work with care management team to address needs related to care coordination needs and caregiver asistance.  Goal Met . New 01/20/20 Over the next 60 days the patient and her son will work with primary care team to address concern surrounding sleep schedule  CCM SW Interventions: Completed 02/11/20 with the patient . Successful outbound call placed to the patient to assess goal progression o Patient self reports her sleep has improved since getting a new bed . Assessed for other acute care coordination needs o None noted at this time . Performed chart review to note future PCP appointment scheduled for 04/13/20 o Advised patient to speak with PCP during appointment if sleep is still a concern . Scheduled follow up call over the next 60 days  Completed 01/20/20 with the patient and her son [name] . Successful outbound call placed to the patients son/caregiver [name] to assess patient care coordination needs . Determined the patient is doing well since starting Aricept as evidenced by decrease in agitation . Discussed the patient continues to struggle with a normal sleep routine o Patient stays up during the night rummaging . [name] SW would collaborate with the patients primary care provider to determine if a medication is needed for intervention . Discussed lack of sleep with the patient who agrees with consulting her primary care provider for assistance . Collaboration with [name], FNP to request patient follow  up . Collaboration with [place] to advise of plan  Completed 12/26/19 with the patients daughter, [name] . Collaboration with RN Care Manager who indicates patient daughter interest in adult day programs as well as mobile meals . Successful outbound call placed to the patients daughter [name] to assess patient care coordination needs . Determined [name] is interested in adult day programs for the patient other than [facility] due to wanting to keep the patient current primary care provider  . Provided education surrounding programming opportunities at local senior centers as well as adult day program with [facility] reports she would like a full day program for the patient but is unsure of monetary ability to enroll with [facility] to speak with her family to discuss patient care needs and contact SW if a referral is needed . Provided education on mobile meal program o Placed referral via [facility] to [facility] [place]  . SW will follow up with the patient over the next month as previously scheduled  Completed 12/15/19 with the patient and her son, [name] . Successful outbound call placed to the patient to follow up on goal progression . Reviewed the patient eligibility to enroll in [facility] with monthly co-pay amount of approximately $90 . Determined the patient does not feel like she can afford the cost at this time . Scheduled follow up call over the next month to assist with ongoing care coordination needs  Completed 11/12/19 . SW collaboration with  [name] with [facility] requesting follow up on outcome of patient enrollment 4:30pm . Inbound call received from [name] stating patient was approved for [facility] enrollment with a $90 monthly co-pay o [name] spoke with the patient about this but has yet to receive contact with patient decision regarding  enrollment . Successful outbound call placed to the patients daughter, [name] to discuss [facility] program and patient co-pay amount o [name] will touch base with patient and brother regarding family decision of enrollment  Patient Self Care Activities:  . Attends all scheduled provider appointments . Calls provider office for new concerns or questions . Supportive family to assist with patient care needs  Please see past updates related to this goal by clicking on the "Past Updates" button in the selected goal          Follow Up Plan: SW will follow up with patient by phone over the next 60 days.   [name], BSW, CDP Social Worker, Certified Dementia Practitioner [place] [facility] [phone]

## 2020-02-11 NOTE — Chronic Care Management (AMB) (Signed)
Chronic Care Management    Social Work Follow Up Note  02/11/2020 Name: Sara Fox MRN: [id number] DOB: 1940-07-24  Sara Fox is a 79 y.o. year old female who is a primary care patient of Sara Fox, [place]. The CCM team was consulted for assistance with care coordination.   Review of patient status, including review of consultants reports, other relevant assessments, and collaboration with appropriate care team members and the patient's provider was performed as part of comprehensive patient evaluation and provision of chronic care management services.    SDOH (Social Determinants of Health) assessments performed: No    Outpatient Encounter Medications as of 02/11/2020  Medication Sig  . aspirin EC 81 MG tablet Take 81 mg by mouth daily.  . budesonide-formoterol (SYMBICORT) 160-4.5 MCG/ACT inhaler Inhale 2 puffs into the lungs 2 (two) times daily.  Marland Kitchen CALCIUM CITRATE PO Take 1 tablet by mouth daily.   . Cholecalciferol (VITAMIN D3) 2000 UNITS capsule Take 2,000 Units by mouth daily.  . ciclopirox (PENLAC) 8 % solution Apply topically at bedtime. Apply over nail and surrounding skin. Apply daily over previous coat. After seven (7) days, file nail and continue cycle.  . [facility] MAGNESIUM OXIDE 250 MG TABS SMARTSIG:1 Tablet(s) By Mouth Every Evening  . donepezil (ARICEPT) 5 MG tablet TAKE 1 TABLET BY MOUTH EVERYDAY AT BEDTIME  . furosemide (LASIX) 20 MG tablet TAKE 1 TABLET BY MOUTH EVERY OTHER DAY  . gabapentin (NEURONTIN) 100 MG capsule TAKE 1 CAPSULE BY MOUTH THREE TIMES DAILY  . metoprolol tartrate (LOPRESSOR) 25 MG tablet Take 1 tablet (25 mg total) by mouth 2 (two) times daily.  . Multiple Vitamin (MULTIVITAMIN) capsule Take 1 capsule by mouth daily. Contains iron  . nitroGLYCERIN (NITROSTAT) 0.4 MG SL tablet PLACE ONE TABLET UNDER THE TONGUE EVERY 5 MINUTES AS NEEDED FOR CHEST PAIN.  Marland Kitchen oxybutynin (DITROPAN) 5 MG tablet TAKE 1 TABLET BY MOUTH TWICE A DAY  . pantoprazole (PROTONIX)  40 MG tablet TAKE 1 TABLET BY MOUTH TWICE A DAY  . polyethylene glycol (MIRALAX / GLYCOLAX) packet Take 17 g by mouth daily as needed for mild constipation.   . Potassium Chloride ER 20 MEQ TBCR TAKE 2 TABLETS BY MOUTH EVERY MORNING  . rosuvastatin (CRESTOR) 40 MG tablet TAKE 1 TABLET BY MOUTH ONCE DAILY  . traMADol (ULTRAM) 50 MG tablet TAKE 1-2 TABLETS BY MOUTH THREE TIMES DAILY.  . vitamin C (ASCORBIC ACID) 500 MG tablet Take 500 mg by mouth daily.   No facility-administered encounter medications on file as of 02/11/2020.     Goals Addressed            This Visit's Progress   . Collaborate with RN Case Manager to assist with care coordination needs       CARE PLAN ENTRY (see longtitudinal plan of care for additional care plan information)  Current Barriers:  . Social Isolation . Memory Deficits . HTN, CHF, CKD III, mild cognitive impairment  Social Work Clinical Goal(s):  Marland Kitchen Over the next 90 days, patient will work with care management team to address needs related to care coordination needs and caregiver asistance.  Goal Met . New 01/20/20 Over the next 60 days the patient and her son will work with primary care team to address concern surrounding sleep schedule  CCM SW Interventions: Completed 02/11/20 with the patient . Successful outbound call placed to the patient to assess goal progression o Patient self reports her sleep has improved since getting a new  bed . Assessed for other acute care coordination needs o None noted at this time . Performed chart review to note future PCP appointment scheduled for 04/13/20 o Advised patient to speak with PCP during appointment if sleep is still a concern . Scheduled follow up call over the next 60 days  Completed 01/20/20 with the patient and her son Sara Fox . Successful outbound call placed to the patients son/caregiver Sara Fox to assess patient care coordination needs . Determined the patient is doing well since starting Aricept as  evidenced by decrease in agitation . Discussed the patient continues to struggle with a normal sleep routine o Patient stays up during the night rummaging . [name] SW would collaborate with the patients primary care provider to determine if a medication is needed for intervention . Discussed lack of sleep with the patient who agrees with consulting her primary care provider for assistance . Collaboration with Sara Brine, FNP to request patient follow up . Collaboration with [place] to advise of plan  Completed 12/26/19 with the patients daughter, Sara Fox . Collaboration with RN Care Manager who indicates patient daughter interest in adult day programs as well as mobile meals . Successful outbound call placed to the patients daughter Sara Fox to assess patient care coordination needs . Determined Sara Fox is interested in adult day programs for the patient other than [facility] due to wanting to keep the patient current primary care provider  . Provided education surrounding programming opportunities at local senior centers as well as adult day program with [facility] reports she would like a full day program for the patient but is unsure of monetary ability to enroll with [facility] to speak with her family to discuss patient care needs and contact SW if a referral is needed . Provided education on mobile meal program o Placed referral via [facility] to [facility] [place]  . SW will follow up with the patient over the next month as previously scheduled  Completed 12/15/19 with the patient and her son, Sara Fox . Successful outbound call placed to the patient to follow up on goal progression . Reviewed the patient eligibility to enroll in [facility] with monthly co-pay amount of approximately $90 . Determined the patient does not feel like she can afford the cost at this time . Scheduled follow up call  over the next month to assist with ongoing care coordination needs  Completed 11/12/19 . SW collaboration with [name] with [facility] requesting follow up on outcome of patient enrollment 4:30pm . Inbound call received from [name] stating patient was approved for [facility] enrollment with a $90 monthly co-pay o [name] spoke with the patient about this but has yet to receive contact with patient decision regarding enrollment . Successful outbound call placed to the patients daughter, Sara Fox to discuss [facility] program and patient co-pay amount o Sara Fox will touch base with patient and brother regarding family decision of enrollment  Patient Self Care Activities:  . Attends all scheduled provider appointments . Calls provider office for new concerns or questions . Supportive family to assist with patient care needs  Please see past updates related to this goal by clicking on the "Past Updates" button in the selected goal          Follow Up Plan: SW will follow up with patient by phone over the next 60 days.  [name], BSW, CDP Social Worker, Certified Dementia Practitioner [place] [facility] [phone]  Total time spent performing care coordination and/or care management activities with the patient by phone or face to face = 10 minutes.

## 2020-02-13 ENCOUNTER — Other Ambulatory Visit: Payer: Self-pay | Admitting: Nurse Practitioner

## 2020-02-23 ENCOUNTER — Other Ambulatory Visit: Payer: Self-pay | Admitting: Nurse Practitioner

## 2020-02-23 DIAGNOSIS — R32 Unspecified urinary incontinence: Secondary | ICD-10-CM

## 2020-02-23 DIAGNOSIS — G3184 Mild cognitive impairment, so stated: Secondary | ICD-10-CM

## 2020-03-10 ENCOUNTER — Telehealth: Payer: Self-pay

## 2020-03-10 NOTE — Telephone Encounter (Signed)
I called patient's son to notify him that her handicap placard has been completed. YL,RMA

## 2020-03-11 ENCOUNTER — Telehealth: Payer: Medicare Other

## 2020-03-14 ENCOUNTER — Other Ambulatory Visit: Payer: Self-pay | Admitting: Nurse Practitioner

## 2020-03-16 ENCOUNTER — Telehealth: Payer: Medicare Other

## 2020-03-16 ENCOUNTER — Telehealth: Payer: Self-pay

## 2020-03-16 NOTE — Telephone Encounter (Signed)
  Chronic Care Management   Outreach Note  03/16/2020 Name: Sara Fox MRN: [id number] DOB: June 11, 1940  Referred by: [name], FNP Reason for referral : Care Coordination   An unsuccessful telephone outreach was attempted today. The patient was referred to the case management team for assistance with care management and care coordination.   Follow Up Plan: A HIPAA compliant phone message was left for the patient providing contact information and requesting a return call.  The care management team will reach out to the patient again over the next 21 days.   [name], BSW, CDP Social Worker, Certified Dementia Practitioner [place] [facility] [place] [facility] [phone]

## 2020-03-26 ENCOUNTER — Other Ambulatory Visit: Payer: Self-pay | Admitting: Physical Medicine & Rehabilitation

## 2020-04-01 ENCOUNTER — Encounter: Payer: Self-pay | Admitting: Sports Medicine

## 2020-04-01 ENCOUNTER — Ambulatory Visit (INDEPENDENT_AMBULATORY_CARE_PROVIDER_SITE_OTHER): Payer: Medicare Other | Admitting: Sports Medicine

## 2020-04-01 ENCOUNTER — Other Ambulatory Visit: Payer: Self-pay

## 2020-04-01 DIAGNOSIS — M79674 Pain in right toe(s): Secondary | ICD-10-CM

## 2020-04-01 DIAGNOSIS — M79675 Pain in left toe(s): Secondary | ICD-10-CM

## 2020-04-01 DIAGNOSIS — B351 Tinea unguium: Secondary | ICD-10-CM

## 2020-04-01 DIAGNOSIS — R413 Other amnesia: Secondary | ICD-10-CM

## 2020-04-01 DIAGNOSIS — L84 Corns and callosities: Secondary | ICD-10-CM

## 2020-04-01 NOTE — Progress Notes (Signed)
Subjective: Sara Fox is a 79 y.o. female patient seen today in office with complaint of mildly painful corn and thickened and elongated toenails; unable to trim. Patient denies changes with medical history since last encounter. No other issues noted.  Patient Active Problem List   Diagnosis Date Noted  . Fatigue 02/18/2019  . Memory loss 02/18/2019  . Vitamin B12 deficiency 10/23/2018  . Hypotension 10/23/2018  . Stage 3 chronic kidney disease ([place]) 06/03/2018  . Chronic anemia 06/03/2018  . Hypercholesterolemia 06/03/2018  . Malnutrition of moderate degree 07/12/2017  . Hypoalbuminemia 07/11/2017  . Elevated LFTs 07/11/2017  . Closed fracture of left distal radius 05/28/2017  . Snoring 12/21/2016  . OSA and COPD overlap syndrome ([place]) 12/21/2016  . Peripheral musculoskeletal gait disorder 02/05/2015  . Lumbar post-laminectomy syndrome 02/05/2015  . CAD in native artery 10/16/2014  . NSTEMI (non-ST elevated myocardial infarction) ([place]) 09/13/2014  . Cardiomyopathy, ischemic 09/13/2014  . Chronic systolic heart failure ([place])   . Tobacco abuse 09/12/2014  . Dyslipidemia 09/12/2014  . CKD (chronic kidney disease), stage II 09/12/2014  . Normocytic anemia 09/12/2014  . Chronic diastolic heart failure, NYHA class 1 ([place]) 09/12/2014  . Postlaminectomy syndrome, cervical region 08/01/2013  . Chronic midline low back pain with left-sided sciatica 08/01/2013  . Shoulder joint contracture 08/01/2013  . HTN (hypertension) 03/03/2013  . Abnormal genetic test 03/03/2013    Current Outpatient Medications on File Prior to Visit  Medication Sig Dispense Refill  . traMADol (ULTRAM) 50 MG tablet TAKE 1 TABLETS BY MOUTH THREE TIMES DAILY(on 20 days ea month she is allowed to take an extra tablet in which case she is receiving 140 tablets/month) 140 tablet 1  . aspirin EC 81 MG tablet Take 81 mg by mouth daily.    Marland Kitchen CALCIUM CITRATE PO Take 1 tablet by mouth daily.     . Cholecalciferol  (VITAMIN D3) 2000 UNITS capsule Take 2,000 Units by mouth daily.    . ciclopirox (PENLAC) 8 % solution Apply topically at bedtime. Apply over nail and surrounding skin. Apply daily over previous coat. After seven (7) days, file nail and continue cycle. 6.6 mL 0  . [employer] MAGNESIUM OXIDE 250 MG TABS SMARTSIG:1 Tablet(s) By Mouth Every Evening    . donepezil (ARICEPT) 5 MG tablet TAKE 1 TABLET BY MOUTH EVERYDAY AT BEDTIME 90 tablet 1  . furosemide (LASIX) 20 MG tablet TAKE 1 TABLET BY MOUTH EVERY OTHER DAY 45 tablet 1  . gabapentin (NEURONTIN) 100 MG capsule TAKE 1 CAPSULE BY MOUTH THREE TIMES DAILY 270 capsule 0  . metoprolol tartrate (LOPRESSOR) 25 MG tablet Take 1 tablet (25 mg total) by mouth 2 (two) times daily. 90 tablet 3  . Multiple Vitamin (MULTIVITAMIN) capsule Take 1 capsule by mouth daily. Contains iron    . nitroGLYCERIN (NITROSTAT) 0.4 MG SL tablet PLACE ONE TABLET UNDER THE TONGUE EVERY 5 MINUTES AS NEEDED FOR CHEST PAIN. 50 tablet 0  . oxybutynin (DITROPAN) 5 MG tablet TAKE 1 TABLET BY MOUTH TWICE A DAY 180 tablet 1  . pantoprazole (PROTONIX) 40 MG tablet TAKE 1 TABLET BY MOUTH TWICE A DAY 180 tablet 1  . polyethylene glycol (MIRALAX / GLYCOLAX) packet Take 17 g by mouth daily as needed for mild constipation.     . Potassium Chloride ER 20 MEQ TBCR TAKE 2 TABLETS BY MOUTH EVERY MORNING 180 tablet 3  . rosuvastatin (CRESTOR) 40 MG tablet TAKE 1 TABLET BY MOUTH ONCE DAILY 30 tablet 9  .  SYMBICORT 160-4.5 MCG/ACT inhaler TAKE 2 PUFFS BY MOUTH TWICE A DAY 30.6 each 1  . vitamin C (ASCORBIC ACID) 500 MG tablet Take 500 mg by mouth daily.     No current facility-administered medications on file prior to visit.    Allergies  Allergen Reactions  . Buprenorphine Hcl Shortness Of Breath    Throat swelling/trouble breathing and lethargic  . Morphine And Related Shortness Of Breath    Throat swelling/trouble breathing and lethargic  . Celebrex [Celecoxib] Diarrhea and Swelling    Swelling  of legs   . Vioxx [Rofecoxib] Palpitations  . Codeine Other (See Comments)    Bloated     Objective: Physical Exam  General: Well developed, nourished, no acute distress, awake, alert and oriented x 2   Vascular: Dorsalis pedis artery 2/4 bilateral, Posterior tibial artery 2/4 bilateral, skin temperature warm to warm proximal to distal bilateral lower extremities, no varicosities, pedal hair present bilateral.  Neurological: Gross sensation present via light touch bilateral.   Dermatological: Skin is warm, dry, and supple bilateral, Nails 1-10 are tender, long, thick, and discolored with mild subungal debris, no webspace macerations present bilateral, no open lesions present bilateral, + corn/hyperkeratotic tissue present right 3rd toe. No signs of infection bilateral.  Musculoskeletal: Asymptomatic hammertoe boney deformities noted bilateral. Muscular strength within normal limits without painon range of motion. No pain with calf compression bilateral.  Assessment and Plan:  Problem List Items Addressed This Visit      Other   Memory loss    Other Visit Diagnoses    Pain due to onychomycosis of toenails of both feet    -  Primary   Corns and callosities       Toe pain, bilateral         -Examined patient.  -Re-Discussed treatment options for painful mycotic nails/corn. -Mechanically debrided and reduced mycotic nails with sterile nail nipper and dremel nail file without incident. -At no charge trimmed corn at right 3rd toe distal tuft -Continue with Penlac to nails like before -Patient to return in 3 months for follow up evaluation or sooner if symptoms worsen.  Landis Martins, DPM

## 2020-04-06 ENCOUNTER — Telehealth: Payer: Self-pay

## 2020-04-06 ENCOUNTER — Telehealth: Payer: Medicare Other

## 2020-04-06 NOTE — Telephone Encounter (Signed)
  Chronic Care Management   Outreach Note  04/06/2020 Name: Sara Fox MRN: [id number] DOB: 1940-10-01  Referred by: [name], FNP Reason for referral : Care Coordination   A second unsuccessful telephone outreach was attempted today. The patient was referred to the case management team for assistance with care management and care coordination.   Follow Up Plan: The care management team will reach out to the patient again over the next 21 days.   [name], BSW, CDP Social Worker, Certified Dementia Practitioner [place] [phone]

## 2020-04-13 ENCOUNTER — Ambulatory Visit: Payer: Medicare Other | Admitting: Nurse Practitioner

## 2020-04-14 ENCOUNTER — Ambulatory Visit: Payer: Self-pay

## 2020-04-14 ENCOUNTER — Other Ambulatory Visit: Payer: Self-pay

## 2020-04-14 ENCOUNTER — Telehealth: Payer: Medicare Other

## 2020-04-14 DIAGNOSIS — N1831 Chronic kidney disease, stage 3a: Secondary | ICD-10-CM

## 2020-04-14 DIAGNOSIS — I5022 Chronic systolic (congestive) heart failure: Secondary | ICD-10-CM

## 2020-04-14 DIAGNOSIS — I25118 Atherosclerotic heart disease of native coronary artery with other forms of angina pectoris: Secondary | ICD-10-CM

## 2020-04-14 DIAGNOSIS — I10 Essential (primary) hypertension: Secondary | ICD-10-CM

## 2020-04-14 DIAGNOSIS — G3184 Mild cognitive impairment, so stated: Secondary | ICD-10-CM

## 2020-04-14 NOTE — Patient Instructions (Addendum)
Visit Information  Goals Addressed    . "to improve renal function"       CARE PLAN ENTRY (see longitudinal plan of care for additional care plan information)  Current Barriers:  Marland Kitchen Knowledge Deficits related to disease process and Self Health management of stage 3a chronic kidney disease  . Chronic Disease Management support and education needs related to Stage 3a chronic kidney disease, HTN, chronic systolic heart failure, Mild Cognitive Impairment, Atherosclerotic heart disease of native coronary artery with other forms of angina pectoris   Nurse Case Manager Clinical Goal(s):  Marland Kitchen Over the next 90 days, patient will work with the CCM team and PCP to address needs related to disease education and support for improved Self Health management of CKD   CCM RN CM Interventions:  04/14/20 call completed with patient  . Inter-disciplinary care team collaboration (see longitudinal plan of care) . Evaluation of current treatment plan related to stage 3 chronic kidney disease and patient's adherence to plan as established by provider. . Provided education to patient re: current GFR has improved; Educated on importance of hydration with water to maintain/improve renal function  . Discussed plans with patient for ongoing care management follow up and provided patient with direct contact information for care management team . Provided patient with printed educational materials related to stages of Chronic Kidney Disease; 6 Ways to be Water Wise  . Reviewed scheduled/upcoming provider appointments including: PCP [name] FNP next OV scheduled for 04/22/20 @9 :15 am  Patient Self Care Activities:  . Self administers medications as prescribed . Attend all scheduled provider appointments . Call pharmacy for medication refills . Call provider office for new concerns or questions . Supportive son to assist with care needs  Initial goal documentation     . "to keep her dementia under good control"        Son stated [place] (see longitudinal plan of care for additional care plan information)  Current Barriers:  Marland Kitchen Knowledge Deficits related to disease process and Self Health management of dementia  . Chronic Disease Management support and education needs related to Stage 3a chronic kidney disease, HTN, chronic systolic heart failure, Mild Cognitive Impairment, Atherosclerotic heart disease of native coronary artery with other forms of angina pectoris  Nurse Case Manager Clinical Goal(s):  . 04/14/20 New Over the next 90 days, patient will work with the CCM RN and PCP to address needs related to disease education and support to improve Self Health management of dementia  CCM RN CM Interventions:  04/14/20 call completed with patient's son [name] . Inter-disciplinary care team collaboration (see longitudinal plan of care) . Evaluation of current treatment plan related to dementia and patient's adherence to plan as established by provider . Determined patient continues to live with her son [name] who is her primary caregiver, her daughter [name] is very involved in her care and makes routine visits to see her mother . Determined son [name] is supervising patient medication administration, patient is self administering at this time w/o difficulty, [name] denies need for Pharmacy review at this time . Determined son [name] feels his mother's dementia is stable at this time without noted disease progression . Determined son [name] is looking to order a dementia book called "You are not Alone" per his mother's request to help keep her mind stimulated, he states, "I like keeping her mind active and busy" . Discussed plans with patient for ongoing care management follow up and provided patient with direct contact information  for care management team  Patient Self Care Activities:  . Attends all scheduled provider appointments . Calls provider office for new concerns or questions . Supportive  family to assist with patient care needs  Please see past updates related to this goal by clicking on the "Past Updates" button in the selected goal        The patient verbalized understanding of instructions, educational materials, and care plan provided today and declined offer to receive copy of patient instructions, educational materials, and care plan.   Telephone follow up appointment with care management team member scheduled for: 05/18/20  Barb Merino, RN, BSN, CCM Care Management Coordinator [facility]  Direct Phone: [phone]

## 2020-04-14 NOTE — Chronic Care Management (AMB) (Signed)
Chronic Care Management   Follow Up Note   04/14/2020 Name: Sara Fox MRN: [id number] DOB: Jun 29, 1940  Referred by: Sara Brine, FNP Reason for referral : Chronic Care Management (RNCM FU Call )   Sara Fox is a 79 y.o. year old female who is a primary care patient of Sara Fox, [place]. The CCM team was consulted for assistance with chronic disease management and care coordination needs.    Review of patient status, including review of consultants reports, relevant laboratory and other test results, and collaboration with appropriate care team members and the patient's provider was performed as part of comprehensive patient evaluation and provision of chronic care management services.    SDOH (Social Determinants of Health) assessments performed: No See Care Plan activities for detailed interventions related to Sara Fox)   Placed outbound CCM RN CM FU Call to patient's son Sara Fox for a care plan update.     Outpatient Encounter Medications as of 04/14/2020  Medication Sig  . traMADol (ULTRAM) 50 MG tablet TAKE 1 TABLETS BY MOUTH THREE TIMES DAILY(on 20 days ea month she is allowed to take an extra tablet in which case she is receiving 140 tablets/month)  . aspirin EC 81 MG tablet Take 81 mg by mouth daily.  Marland Kitchen CALCIUM CITRATE PO Take 1 tablet by mouth daily.   . Cholecalciferol (VITAMIN D3) 2000 UNITS capsule Take 2,000 Units by mouth daily.  . ciclopirox (PENLAC) 8 % solution Apply topically at bedtime. Apply over nail and surrounding skin. Apply daily over previous coat. After seven (7) days, file nail and continue cycle.  . CVS MAGNESIUM OXIDE 250 MG TABS SMARTSIG:1 Tablet(s) By Mouth Every Evening  . donepezil (ARICEPT) 5 MG tablet TAKE 1 TABLET BY MOUTH EVERYDAY AT BEDTIME  . furosemide (LASIX) 20 MG tablet TAKE 1 TABLET BY MOUTH EVERY OTHER DAY  . gabapentin (NEURONTIN) 100 MG capsule TAKE 1 CAPSULE BY MOUTH THREE TIMES DAILY  . metoprolol tartrate (LOPRESSOR) 25 MG  tablet Take 1 tablet (25 mg total) by mouth 2 (two) times daily.  . Multiple Vitamin (MULTIVITAMIN) capsule Take 1 capsule by mouth daily. Contains iron  . nitroGLYCERIN (NITROSTAT) 0.4 MG SL tablet PLACE ONE TABLET UNDER THE TONGUE EVERY 5 MINUTES AS NEEDED FOR CHEST PAIN.  Marland Kitchen oxybutynin (DITROPAN) 5 MG tablet TAKE 1 TABLET BY MOUTH TWICE A DAY  . pantoprazole (PROTONIX) 40 MG tablet TAKE 1 TABLET BY MOUTH TWICE A DAY  . polyethylene glycol (MIRALAX / GLYCOLAX) packet Take 17 g by mouth daily as needed for mild constipation.   . Potassium Chloride ER 20 MEQ TBCR TAKE 2 TABLETS BY MOUTH EVERY MORNING  . rosuvastatin (CRESTOR) 40 MG tablet TAKE 1 TABLET BY MOUTH ONCE DAILY  . SYMBICORT 160-4.5 MCG/ACT inhaler TAKE 2 PUFFS BY MOUTH TWICE A DAY  . vitamin C (ASCORBIC ACID) 500 MG tablet Take 500 mg by mouth daily.   No facility-administered encounter medications on file as of 04/14/2020.     Objective:  No results found for: HGBA1C Lab Results  Component Value Date   MICROALBUR 150 12/05/2018   LDLCALC 93 12/11/2019   CREATININE 1.04 (H) 12/11/2019   BP Readings from Last 3 Encounters:  01/01/20 128/70  12/11/19 (!) 130/74  12/11/19 (!) 130/74    Goals Addressed    . "to improve renal function"       CARE PLAN ENTRY (see longitudinal plan of care for additional care plan information)  Current Barriers:  Marland Kitchen Knowledge Deficits  related to disease process and Self Health management of stage 3a chronic kidney disease  . Chronic Disease Management support and education needs related to Stage 3a chronic kidney disease, HTN, chronic systolic heart failure, Mild Cognitive Impairment, Atherosclerotic heart disease of native coronary artery with other forms of angina pectoris   Nurse Case Manager Clinical Goal(s):  Marland Kitchen Over the next 90 days, patient will work with the CCM team and PCP to address needs related to disease education and support for improved Self Health management of CKD   CCM RN CM  Interventions:  04/14/20 call completed with patient  . Inter-disciplinary care team collaboration (see longitudinal plan of care) . Evaluation of current treatment plan related to stage 3 chronic kidney disease and patient's adherence to plan as established by provider. . Provided education to patient re: current GFR has improved; Educated on importance of hydration with water to maintain/improve renal function  . Discussed plans with patient for ongoing care management follow up and provided patient with direct contact information for care management team . Provided patient with printed educational materials related to stages of Chronic Kidney Disease; 6 Ways to be Water Wise  . Reviewed scheduled/upcoming provider appointments including: PCP Sara Brine FNP next OV scheduled for 04/22/20 @9 :15 am  Patient Self Care Activities:  . Self administers medications as prescribed . Attend all scheduled provider appointments . Call pharmacy for medication refills . Call provider office for new concerns or questions . Supportive son to assist with care needs  Initial goal documentation     . "to keep her dementia under good control"       Son stated Sara Fox (see longitudinal plan of care for additional care plan information)  Current Barriers:  Marland Kitchen Knowledge Deficits related to disease process and Self Health management of dementia  . Chronic Disease Management support and education needs related to Stage 3a chronic kidney disease, HTN, chronic systolic heart failure, Mild Cognitive Impairment, Atherosclerotic heart disease of native coronary artery with other forms of angina pectoris  Nurse Case Manager Clinical Goal(s):  . 04/14/20 New Over the next 90 days, patient will work with the CCM RN and PCP to address needs related to disease education and support to improve Self Health management of dementia  CCM RN CM Interventions:  04/14/20 call completed with patient's son  Sara Fox . Inter-disciplinary care team collaboration (see longitudinal plan of care) . Evaluation of current treatment plan related to dementia and patient's adherence to plan as established by provider . Determined patient continues to live with her son Sara Fox who is her primary caregiver, her daughter Sara Fox is very involved in her care and makes routine visits to see her mother . Determined son Sara Fox is supervising patient medication administration, patient is self administering at this time w/o difficulty, Sara Fox denies need for Pharmacy review at this time . Determined son Sara Fox feels his mother's dementia is stable at this time without noted disease progression . Determined son Sara Fox is looking to order a dementia book called "You are not Alone" per his mother's request to help keep her mind stimulated, he states, "I like keeping her mind active and busy" . Discussed plans with patient for ongoing care management follow up and provided patient with direct contact information for care management team  Patient Self Care Activities:  . Attends all scheduled provider appointments . Calls provider office for new concerns or questions . Supportive family to assist with patient care needs  Please see past  updates related to this goal by clicking on the "Past Updates" button in the selected goal        Plan:   Telephone follow up appointment with care management team member scheduled for: 05/18/20  Barb Merino, RN, BSN, CCM Care Management Coordinator [place] [facility]  Direct Phone: [phone]

## 2020-04-20 ENCOUNTER — Telehealth: Payer: Medicare Other

## 2020-04-22 ENCOUNTER — Ambulatory Visit (INDEPENDENT_AMBULATORY_CARE_PROVIDER_SITE_OTHER): Payer: Medicare Other | Admitting: Nurse Practitioner

## 2020-04-22 ENCOUNTER — Encounter: Payer: Self-pay | Admitting: Nurse Practitioner

## 2020-04-22 ENCOUNTER — Other Ambulatory Visit: Payer: Self-pay

## 2020-04-22 VITALS — BP 138/70 | HR 88 | Temp 98.1°F | Ht 63.0 in | Wt 130.6 lb

## 2020-04-22 DIAGNOSIS — I130 Hypertensive heart and chronic kidney disease with heart failure and stage 1 through stage 4 chronic kidney disease, or unspecified chronic kidney disease: Secondary | ICD-10-CM | POA: Diagnosis not present

## 2020-04-22 DIAGNOSIS — E7800 Pure hypercholesterolemia, unspecified: Secondary | ICD-10-CM

## 2020-04-22 DIAGNOSIS — R438 Other disturbances of smell and taste: Secondary | ICD-10-CM

## 2020-04-22 DIAGNOSIS — I25118 Atherosclerotic heart disease of native coronary artery with other forms of angina pectoris: Secondary | ICD-10-CM | POA: Diagnosis not present

## 2020-04-22 DIAGNOSIS — Z720 Tobacco use: Secondary | ICD-10-CM

## 2020-04-22 DIAGNOSIS — N1831 Chronic kidney disease, stage 3a: Secondary | ICD-10-CM | POA: Diagnosis not present

## 2020-04-22 NOTE — Patient Instructions (Addendum)
High Cholesterol  High cholesterol is a condition in which the blood has high levels of a white, waxy, fat-like substance (cholesterol). The human body needs small amounts of cholesterol. The liver makes all the cholesterol that the body needs. Extra (excess) cholesterol comes from the food that we eat. Cholesterol is carried from the liver by the blood through the blood vessels. If you have high cholesterol, deposits (plaques) may build up on the walls of your blood vessels (arteries). Plaques make the arteries narrower and stiffer. Cholesterol plaques increase your risk for heart attack and stroke. Work with your health care provider to keep your cholesterol levels in a healthy range. What increases the risk? This condition is more likely to develop in people who:  Eat foods that are high in animal fat (saturated fat) or cholesterol.  Are overweight.  Are not getting enough exercise.  Have a family history of high cholesterol. What are the signs or symptoms? There are no symptoms of this condition. How is this diagnosed? This condition may be diagnosed from the results of a blood test.  If you are older than age 71, your health care provider may check your cholesterol every 4-6 years.  You may be checked more often if you already have high cholesterol or other risk factors for heart disease. The blood test for cholesterol measures:  "Bad" cholesterol (LDL cholesterol). This is the main type of cholesterol that causes heart disease. The desired level for LDL is less than 100.  "Good" cholesterol (HDL cholesterol). This type helps to protect against heart disease by cleaning the arteries and carrying the LDL away. The desired level for HDL is 60 or higher.  Triglycerides. These are fats that the body can store or burn for energy. The desired number for triglycerides is lower than 150.  Total cholesterol. This is a measure of the total amount of cholesterol in your blood, including LDL  cholesterol, HDL cholesterol, and triglycerides. A healthy number is less than 200. How is this treated? This condition is treated with diet changes, lifestyle changes, and medicines. Diet changes  This may include eating more whole grains, fruits, vegetables, nuts, and fish.  This may also include cutting back on red meat and foods that have a lot of added sugar. Lifestyle changes  Changes may include getting at least 40 minutes of aerobic exercise 3 times a week. Aerobic exercises include walking, biking, and swimming. Aerobic exercise along with a healthy diet can help you maintain a healthy weight.  Changes may also include quitting smoking. Medicines  Medicines are usually given if diet and lifestyle changes have failed to reduce your cholesterol to healthy levels.  Your health care provider may prescribe a statin medicine. Statin medicines have been shown to reduce cholesterol, which can reduce the risk of heart disease. Follow these instructions at home: Eating and drinking If told by your health care provider:  Eat chicken (without skin), fish, veal, shellfish, ground [place] breast, and round or loin cuts of red meat.  Do not eat fried foods or fatty meats, such as hot dogs and salami.  Eat plenty of fruits, such as apples.  Eat plenty of vegetables, such as broccoli, potatoes, and carrots.  Eat beans, peas, and lentils.  Eat grains such as barley, rice, couscous, and bulgur wheat.  Eat pasta without cream sauces.  Use skim or nonfat milk, and eat low-fat or nonfat yogurt and cheeses.  Do not eat or drink whole milk, cream, ice cream, egg yolks,  or hard cheeses.  Do not eat stick margarine or tub margarines that contain trans fats (also called partially hydrogenated oils).  Do not eat saturated tropical oils, such as coconut oil and palm oil.  Do not eat cakes, cookies, crackers, or other baked goods that contain trans fats.  General instructions  Exercise as  directed by your health care provider. Increase your activity level with activities such as gardening, walking, and taking the stairs.  Take over-the-counter and prescription medicines only as told by your health care provider.  Do not use any products that contain nicotine or tobacco, such as cigarettes and e-cigarettes. If you need help quitting, ask your health care provider.  Keep all follow-up visits as told by your health care provider. This is important. Contact a health care provider if:  You are struggling to maintain a healthy diet or weight.  You need help to start on an exercise program.  You need help to stop smoking. Get help right away if:  You have chest pain.  You have trouble breathing. This information is not intended to replace advice given to you by your health care provider. Make sure you discuss any questions you have with your health care provider. Document Revised: 05/04/2017 Document Reviewed: 10/30/2015 Elsevier Patient Education  [place].    Neck Exercises Ask your health care provider which exercises are safe for you. Do exercises exactly as told by your health care provider and adjust them as directed. It is normal to feel mild stretching, pulling, tightness, or discomfort as you do these exercises. Stop right away if you feel sudden pain or your pain gets worse. Do not begin these exercises until told by your health care provider. Neck exercises can be important for many reasons. They can improve strength and maintain flexibility in your neck, which will help your upper back and prevent neck pain. Stretching exercises Rotation neck stretching  1. Sit in a chair or stand up. 2. Place your feet flat on the floor, shoulder width apart. 3. Slowly turn your head (rotate) to the right until a slight stretch is felt. Turn it all the way to the right so you can look over your right shoulder. Do not tilt or tip your head. 4. Hold this position for  10-30 seconds. 5. Slowly turn your head (rotate) to the left until a slight stretch is felt. Turn it all the way to the left so you can look over your left shoulder. Do not tilt or tip your head. 6. Hold this position for 10-30 seconds. Repeat __________ times. Complete this exercise __________ times a day. Neck retraction 1. Sit in a sturdy chair or stand up. 2. Look straight ahead. Do not bend your neck. 3. Use your fingers to push your chin backward (retraction). Do not bend your neck for this movement. Continue to face straight ahead. If you are doing the exercise properly, you will feel a slight sensation in your throat and a stretch at the back of your neck. 4. Hold the stretch for 1-2 seconds. Repeat __________ times. Complete this exercise __________ times a day. Strengthening exercises Neck press 1. Lie on your back on a firm bed or on the floor with a pillow under your head. 2. Use your neck muscles to push your head down on the pillow and straighten your spine. 3. Hold the position as well as you can. Keep your head facing up (in a neutral position) and your chin tucked. 4. Slowly count to  5 while holding this position. Repeat __________ times. Complete this exercise __________ times a day. Isometrics These are exercises in which you strengthen the muscles in your neck while keeping your neck still (isometrics). 1. Sit in a supportive chair and place your hand on your forehead. 2. Keep your head and face facing straight ahead. Do not flex or extend your neck while doing isometrics. 3. Push forward with your head and neck while pushing back with your hand. Hold for 10 seconds. 4. Do the sequence again, this time putting your hand against the back of your head. Use your head and neck to push backward against the hand pressure. 5. Finally, do the same exercise on either side of your head, pushing sideways against the pressure of your hand. Repeat __________ times. Complete this  exercise __________ times a day. Prone head lifts 1. Lie face-down (prone position), resting on your elbows so that your chest and upper back are raised. 2. Start with your head facing downward, near your chest. Position your chin either on or near your chest. 3. Slowly lift your head upward. Lift until you are looking straight ahead. Then continue lifting your head as far back as you can comfortably stretch. 4. Hold your head up for 5 seconds. Then slowly lower it to your starting position. Repeat __________ times. Complete this exercise __________ times a day. Supine head lifts 1. Lie on your back (supine position), bending your knees to point to the ceiling and keeping your feet flat on the floor. 2. Lift your head slowly off the floor, raising your chin toward your chest. 3. Hold for 5 seconds. Repeat __________ times. Complete this exercise __________ times a day. Scapular retraction 1. Stand with your arms at your sides. Look straight ahead. 2. Slowly pull both shoulders (scapulae) backward and downward (retraction) until you feel a stretch between your shoulder blades in your upper back. 3. Hold for 10-30 seconds. 4. Relax and repeat. Repeat __________ times. Complete this exercise __________ times a day. Contact a health care provider if:  Your neck pain or discomfort gets much worse when you do an exercise.  Your neck pain or discomfort does not improve within 2 hours after you exercise. If you have any of these problems, stop exercising right away. Do not do the exercises again unless your health care provider says that you can. Get help right away if:  You develop sudden, severe neck pain. If this happens, stop exercising right away. Do not do the exercises again unless your health care provider says that you can. This information is not intended to replace advice given to you by your health care provider. Make sure you discuss any questions you have with your health care  provider. Document Revised: 02/27/2018 Document Reviewed: 02/27/2018 Elsevier Patient Education  [place].

## 2020-04-22 NOTE — Progress Notes (Signed)
I,Yamilka Roman [employer] as a Education administrator for [employer], FNP.,have documented all relevant documentation on the behalf of [name], FNP,as directed by  [name], FNP while in the presence of [name], [place]. This visit occurred during the SARS-CoV-2 public health emergency.  Safety protocols were in place, including screening questions prior to the visit, additional usage of staff PPE, and extensive cleaning of exam room while observing appropriate contact time as indicated for disinfecting solutions.  Subjective:     Patient ID: Sara Fox , female    DOB: 23-Mar-1941 , 79 y.o.   MRN: [id number]   Chief Complaint  Patient presents with  . Hyperlipidemia    HPI  Patient here for a f/u on her cholesterol.   Wt Readings from Last 3 Encounters: 04/22/20 : 130 lb 9.6 oz (59.2 kg) 01/01/20 : 127 lb 12.8 oz (58 kg) 12/11/19 : 130 lb 6.4 oz (59.1 kg)  She has seen the podiatrist since her last visit  Her son reports she does not have a sense of smell.  When he cleans up in the house with bleach and will say she can not smell the scent. Continues to eat well   Hyperlipidemia This is a chronic problem. The problem is uncontrolled. There are no known factors aggravating her hyperlipidemia. Pertinent negatives include no chest pain. There are no compliance problems.  Risk factors for coronary artery disease include a sedentary lifestyle.     Past Medical History:  Diagnosis Date  . Acute kidney injury ([place])   . Back pain   . Cervical cancer ([place])   . CHF (congestive heart failure) ([place])   . COPD (chronic obstructive pulmonary disease) ([place])   . Coronary artery disease    s/p DES x2 to RCA 2016  . GERD (gastroesophageal reflux disease)   . Hyperlipidemia   . Hypertension   . Myocardial infarction ([place])   . Neck pain   . Rhabdomyolysis   . Stomach problems      Family History  Problem Relation Age of Onset  . Heart disease Mother        also HTN  . Diabetes  Mother   . Colon cancer Mother   . Heart disease Brother        deceased at 47  . Hypertension Brother   . Heart attack Brother   . Heart disease Brother   . Hypertension Brother      Current Outpatient Medications:  .  aspirin EC 81 MG tablet, Take 81 mg by mouth daily., Disp: , Rfl:  .  CALCIUM CITRATE PO, Take 1 tablet by mouth daily. , Disp: , Rfl:  .  Cholecalciferol (VITAMIN D3) 2000 UNITS capsule, Take 2,000 Units by mouth daily., Disp: , Rfl:  .  ciclopirox (PENLAC) 8 % solution, Apply topically at bedtime. Apply over nail and surrounding skin. Apply daily over previous coat. After seven (7) days, file nail and continue cycle., Disp: 6.6 mL, Rfl: 0 .  [employer] MAGNESIUM OXIDE 250 MG TABS, SMARTSIG:1 Tablet(s) By Mouth Every Evening, Disp: , Rfl:  .  donepezil (ARICEPT) 5 MG tablet, TAKE 1 TABLET BY MOUTH EVERYDAY AT BEDTIME, Disp: 90 tablet, Rfl: 1 .  furosemide (LASIX) 20 MG tablet, TAKE 1 TABLET BY MOUTH EVERY OTHER DAY, Disp: 45 tablet, Rfl: 1 .  gabapentin (NEURONTIN) 100 MG capsule, TAKE 1 CAPSULE BY MOUTH THREE TIMES DAILY, Disp: 270 capsule, Rfl: 0 .  metoprolol tartrate (LOPRESSOR) 25 MG tablet, Take 1 tablet (25  mg total) by mouth 2 (two) times daily., Disp: 90 tablet, Rfl: 3 .  Multiple Vitamin (MULTIVITAMIN) capsule, Take 1 capsule by mouth daily. Contains iron, Disp: , Rfl:  .  nitroGLYCERIN (NITROSTAT) 0.4 MG SL tablet, PLACE ONE TABLET UNDER THE TONGUE EVERY 5 MINUTES AS NEEDED FOR CHEST PAIN., Disp: 50 tablet, Rfl: 0 .  oxybutynin (DITROPAN) 5 MG tablet, TAKE 1 TABLET BY MOUTH TWICE A DAY, Disp: 180 tablet, Rfl: 1 .  pantoprazole (PROTONIX) 40 MG tablet, TAKE 1 TABLET BY MOUTH TWICE A DAY, Disp: 180 tablet, Rfl: 1 .  polyethylene glycol (MIRALAX / GLYCOLAX) packet, Take 17 g by mouth daily as needed for mild constipation. , Disp: , Rfl:  .  Potassium Chloride ER 20 MEQ TBCR, TAKE 2 TABLETS BY MOUTH EVERY MORNING, Disp: 180 tablet, Rfl: 3 .  rosuvastatin (CRESTOR) 40 MG  tablet, TAKE 1 TABLET BY MOUTH ONCE DAILY, Disp: 30 tablet, Rfl: 9 .  SYMBICORT 160-4.5 MCG/ACT inhaler, TAKE 2 PUFFS BY MOUTH TWICE A DAY, Disp: 30.6 each, Rfl: 1 .  traMADol (ULTRAM) 50 MG tablet, TAKE 1 TABLETS BY MOUTH THREE TIMES DAILY(on 20 days ea month she is allowed to take an extra tablet in which case she is receiving 140 tablets/month), Disp: 140 tablet, Rfl: 1 .  vitamin C (ASCORBIC ACID) 500 MG tablet, Take 500 mg by mouth daily., Disp: , Rfl:    Allergies  Allergen Reactions  . Buprenorphine Hcl Shortness Of Breath    Throat swelling/trouble breathing and lethargic  . Morphine And Related Shortness Of Breath    Throat swelling/trouble breathing and lethargic  . Celebrex [Celecoxib] Diarrhea and Swelling    Swelling of legs   . Vioxx [Rofecoxib] Palpitations  . Codeine Other (See Comments)    Bloated      Review of Systems  Constitutional: Negative.   Respiratory: Negative.  Negative for cough.   Cardiovascular: Negative.  Negative for chest pain, palpitations and leg swelling.  Neurological: Negative.   Psychiatric/Behavioral: Negative.      Today's Vitals   04/22/20 0925  BP: 138/70  Pulse: 88  Temp: 98.1 F (36.7 C)  TempSrc: Oral  Weight: 130 lb 9.6 oz (59.2 kg)  Height: _0  (1.6 m)  PainSc: 0-No pain   Body mass index is 23.13 kg/m.   Objective:  Physical Exam Vitals reviewed.  Constitutional:      General: She is not in acute distress.    Appearance: Normal appearance. She is well-developed.  HENT:     Head: Normocephalic and atraumatic.  Eyes:     Pupils: Pupils are equal, round, and reactive to light.  Cardiovascular:     Rate and Rhythm: Normal rate and regular rhythm.     Pulses: Normal pulses.     Heart sounds: Normal heart sounds. No murmur heard.   Pulmonary:     Effort: Pulmonary effort is normal. No respiratory distress.     Breath sounds: Normal breath sounds.  Musculoskeletal:        General: Normal range of motion.   Skin:    General: Skin is warm and dry.     Capillary Refill: Capillary refill takes less than 2 seconds.  Neurological:     General: No focal deficit present.     Mental Status: She is alert and oriented to person, place, and time.     Cranial Nerves: No cranial nerve deficit.  Psychiatric:        Mood and Affect: Mood normal.  Behavior: Behavior normal.        Thought Content: Thought content normal.        Judgment: Judgment normal.         Assessment And Plan:     1. Hypercholesterolemia  Chronic, stable  Continue with current medications  - CMP14+EGFR - Lipid panel  2. Atherosclerotic heart disease of native coronary artery with other forms of angina pectoris (HCC)  Chronic, stable  Continue follow up with cardiology  3. 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 Stage 3a chronic kidney disease ([place])  Encouraged to stay well hydrated with water  5. Decreased sense of smell  Son reports this as ongoing for several months and denies she had covid or current covid symptoms  Will check sinus xray, has history of sinus problems - DG Sinuses Complete; Future  6. Tobacco abuse Smoking cessation instruction/counseling given:  counseled patient on the dangers of tobacco use, advised patient to stop smoking, and reviewed strategies to maximize success  Ready to quit: No Counseling given: Yes Comment: currently smokes 1/2 ppd or less will order CT scan lung low dose as screening - CT CHEST LUNG CA SCREEN LOW DOSE W/O CM; Future     Patient was given opportunity to ask questions. Patient verbalized understanding of the plan and was able to repeat key elements of the plan. All questions were answered to their satisfaction.    Teola Bradley, FNP, have reviewed all documentation for this visit. The documentation on 04/26/20 for the  exam, diagnosis, procedures, and orders are all accurate and complete. THE PATIENT IS ENCOURAGED TO PRACTICE SOCIAL DISTANCING DUE TO THE COVID-19 PANDEMIC.

## 2020-04-23 LAB — CMP14+EGFR
ALT: 10 IU/L (ref 0–32)
AST: 16 IU/L (ref 0–40)
Albumin/Globulin Ratio: 1.5 (ref 1.2–2.2)
Albumin: 3.8 g/dL (ref 3.7–4.7)
Alkaline Phosphatase: 91 IU/L (ref 44–121)
BUN/Creatinine Ratio: 15 (ref 12–28)
BUN: 19 mg/dL (ref 8–27)
Bilirubin Total: 0.3 mg/dL (ref 0.0–1.2)
CO2: 19 mmol/L — ABNORMAL LOW (ref 20–29)
Calcium: 9.1 mg/dL (ref 8.7–10.3)
Chloride: 111 mmol/L — ABNORMAL HIGH (ref 96–106)
Creatinine, Ser: 1.27 mg/dL — ABNORMAL HIGH (ref 0.57–1.00)
GFR calc Af Amer: 46 mL/min/{1.73_m2} — ABNORMAL LOW (ref >59)
GFR calc non Af Amer: 40 mL/min/{1.73_m2} — ABNORMAL LOW (ref >59)
Globulin, Total: 2.5 g/dL (ref 1.5–4.5)
Glucose: 85 mg/dL (ref 65–99)
Potassium: 4.8 mmol/L (ref 3.5–5.2)
Sodium: 143 mmol/L (ref 134–144)
Total Protein: 6.3 g/dL (ref 6.0–8.5)

## 2020-04-23 LAB — LIPID PANEL
Chol/HDL Ratio: 3.4 ratio (ref 0.0–4.4)
Cholesterol, Total: 162 mg/dL (ref 100–199)
HDL: 48 mg/dL (ref >39)
LDL Chol Calc (NIH): 92 mg/dL (ref 0–99)
Triglycerides: 122 mg/dL (ref 0–149)
VLDL Cholesterol Cal: 22 mg/dL (ref 5–40)

## 2020-04-28 ENCOUNTER — Ambulatory Visit: Payer: Medicare Other

## 2020-04-28 DIAGNOSIS — N1831 Chronic kidney disease, stage 3a: Secondary | ICD-10-CM

## 2020-04-28 DIAGNOSIS — I5022 Chronic systolic (congestive) heart failure: Secondary | ICD-10-CM

## 2020-04-28 DIAGNOSIS — I10 Essential (primary) hypertension: Secondary | ICD-10-CM

## 2020-04-28 DIAGNOSIS — G3184 Mild cognitive impairment, so stated: Secondary | ICD-10-CM

## 2020-04-28 NOTE — Chronic Care Management (AMB) (Signed)
Chronic Care Management    Social Work Follow Up Note  04/28/2020 Name: BRAYLI KLINGBEIL MRN: [id number] DOB: 1941-01-26  JADALYNN BURR is a 79 y.o. year old female who is a primary care patient of [name], [place]. The CCM team was consulted for assistance with care coordination.   Review of patient status, including review of consultants reports, other relevant assessments, and collaboration with appropriate care team members and the patient's provider was performed as part of comprehensive patient evaluation and provision of chronic care management services.    SDOH (Social Determinants of Health) assessments performed: No    Outpatient Encounter Medications as of 04/28/2020  Medication Sig  . aspirin EC 81 MG tablet Take 81 mg by mouth daily.  Marland Kitchen CALCIUM CITRATE PO Take 1 tablet by mouth daily.   . Cholecalciferol (VITAMIN D3) 2000 UNITS capsule Take 2,000 Units by mouth daily.  . ciclopirox (PENLAC) 8 % solution Apply topically at bedtime. Apply over nail and surrounding skin. Apply daily over previous coat. After seven (7) days, file nail and continue cycle.  . [employer]S MAGNESIUM OXIDE 250 MG TABS SMARTSIG:1 Tablet(s) By Mouth Every Evening  . donepezil (ARICEPT) 5 MG tablet TAKE 1 TABLET BY MOUTH EVERYDAY AT BEDTIME  . furosemide (LASIX) 20 MG tablet TAKE 1 TABLET BY MOUTH EVERY OTHER DAY  . gabapentin (NEURONTIN) 100 MG capsule TAKE 1 CAPSULE BY MOUTH THREE TIMES DAILY  . metoprolol tartrate (LOPRESSOR) 25 MG tablet Take 1 tablet (25 mg total) by mouth 2 (two) times daily.  . Multiple Vitamin (MULTIVITAMIN) capsule Take 1 capsule by mouth daily. Contains iron  . nitroGLYCERIN (NITROSTAT) 0.4 MG SL tablet PLACE ONE TABLET UNDER THE TONGUE EVERY 5 MINUTES AS NEEDED FOR CHEST PAIN.  Marland Kitchen oxybutynin (DITROPAN) 5 MG tablet TAKE 1 TABLET BY MOUTH TWICE A DAY  . pantoprazole (PROTONIX) 40 MG tablet TAKE 1 TABLET BY MOUTH TWICE A DAY  . polyethylene glycol (MIRALAX / GLYCOLAX) packet Take 17 g  by mouth daily as needed for mild constipation.   . Potassium Chloride ER 20 MEQ TBCR TAKE 2 TABLETS BY MOUTH EVERY MORNING  . rosuvastatin (CRESTOR) 40 MG tablet TAKE 1 TABLET BY MOUTH ONCE DAILY  . SYMBICORT 160-4.5 MCG/ACT inhaler TAKE 2 PUFFS BY MOUTH TWICE A DAY  . traMADol (ULTRAM) 50 MG tablet TAKE 1 TABLETS BY MOUTH THREE TIMES DAILY(on 20 days ea month she is allowed to take an extra tablet in which case she is receiving 140 tablets/month)  . vitamin C (ASCORBIC ACID) 500 MG tablet Take 500 mg by mouth daily.   No facility-administered encounter medications on file as of 04/28/2020.     Goals Addressed            This Visit's Progress   . COMPLETED: Collaborate with RN Case Manager to assist with care coordination needs       CARE PLAN ENTRY (see longtitudinal plan of care for additional care plan information)  Current Barriers:  . Social Isolation . Memory Deficits . HTN, CHF, CKD III, mild cognitive impairment  Social Work Clinical Goal(s):  Marland Kitchen Over the next 90 days, patient will work with care management team to address needs related to care coordination needs and caregiver asistance.  Goal Met . New 01/20/20 Over the next 60 days the patient and her son will work with primary care team to address concern surrounding sleep schedule  CCM SW Interventions: Completed 04/28/20 . Successful outbound call placed to patients daughter [name]  in response to a request received by the practice regarding concerns surrounding patient life alert device . Determined the patient received a bill in the mail from "[employer]" . Discussed the personal emergency response system SW assisted the family in ordering is a paid benefit under the patients health plan and offered by [employer] . Determined patients family plans to follow up with "[employer]" as the patient does not have a device and should not be charged   Patient Self Care Activities:  . Attends all scheduled provider  appointments . Calls provider office for new concerns or questions . Supportive family to assist with patient care needs  Please see past updates related to this goal by clicking on the "Past Updates" button in the selected goal          Follow Up Plan: SW will follow up with patient by phone over the next month.   [name], BSW, CDP Social Worker, Certified Dementia Practitioner [facility] [phone]  Total time spent performing care coordination and/or care management activities with the patient by phone or face to face = 20 minutes.

## 2020-04-28 NOTE — Patient Instructions (Signed)
Social Worker Visit Information  Goals we discussed today:  Goals Addressed            This Visit's Progress   . COMPLETED: Collaborate with RN Case Manager to assist with care coordination needs       CARE PLAN ENTRY (see longtitudinal plan of care for additional care plan information)  Current Barriers:  . Social Isolation . Memory Deficits . HTN, CHF, CKD III, mild cognitive impairment  Social Work Clinical Goal(s):  Marland Kitchen Over the next 90 days, patient will work with care management team to address needs related to care coordination needs and caregiver asistance.  Goal Met . New 01/20/20 Over the next 60 days the patient and her son will work with primary care team to address concern surrounding sleep schedule  CCM SW Interventions: Completed 04/28/20 . Successful outbound call placed to patients daughter [name] in response to a request received by the practice regarding concerns surrounding patient life alert device . Determined the patient received a bill in the mail from "[employer]" . Discussed the personal emergency response system SW assisted the family in ordering is a paid benefit under the patients health plan and offered by [employer] . Determined patients family plans to follow up with "[employer]" as the patient does not have a device and should not be charged   Patient Self Care Activities:  . Attends all scheduled provider appointments . Calls provider office for new concerns or questions . Supportive family to assist with patient care needs  Please see past updates related to this goal by clicking on the "Past Updates" button in the selected goal           Follow Up Plan: SW will follow up with patient by phone over the next month   [name], BSW, CDP Social Worker, Certified Dementia Practitioner [facility] [phone]

## 2020-05-11 ENCOUNTER — Other Ambulatory Visit: Payer: Self-pay | Admitting: Nurse Practitioner

## 2020-05-11 ENCOUNTER — Ambulatory Visit
Admission: RE | Admit: 2020-05-11 | Discharge: 2020-05-11 | Disposition: A | Discharge status: Home / Self Care | Payer: Medicare Other | Source: Ambulatory Visit | Attending: Nurse Practitioner | Admitting: Nurse Practitioner

## 2020-05-11 DIAGNOSIS — I700 Atherosclerosis of aorta: Secondary | ICD-10-CM | POA: Diagnosis not present

## 2020-05-11 DIAGNOSIS — Z720 Tobacco use: Secondary | ICD-10-CM

## 2020-05-11 DIAGNOSIS — I2510 Atherosclerotic heart disease of native coronary artery without angina pectoris: Secondary | ICD-10-CM | POA: Diagnosis not present

## 2020-05-11 DIAGNOSIS — J439 Emphysema, unspecified: Secondary | ICD-10-CM | POA: Diagnosis not present

## 2020-05-13 ENCOUNTER — Other Ambulatory Visit: Payer: Self-pay | Admitting: Nurse Practitioner

## 2020-05-13 ENCOUNTER — Telehealth: Payer: Self-pay | Admitting: Nurse Practitioner

## 2020-05-13 DIAGNOSIS — IMO0001 Reserved for inherently not codable concepts without codable children: Secondary | ICD-10-CM

## 2020-05-13 DIAGNOSIS — R918 Other nonspecific abnormal finding of lung field: Secondary | ICD-10-CM

## 2020-05-13 DIAGNOSIS — R911 Solitary pulmonary nodule: Secondary | ICD-10-CM

## 2020-05-13 NOTE — Telephone Encounter (Signed)
Called to discuss results of CT scan of lungs, no answer advised to return call on Monday to get results.

## 2020-05-18 ENCOUNTER — Telehealth: Payer: Medicare Other

## 2020-05-18 ENCOUNTER — Telehealth: Payer: Self-pay

## 2020-05-18 NOTE — Telephone Encounter (Cosign Needed)
  Chronic Care Management   Outreach Note  05/18/2020 Name: Sara Fox MRN: [id number] DOB: 07-07-1940  Referred by: [name], FNP Reason for referral : Chronic Care Management (RN CM FU Call )   [name] is enrolled in a Managed [place]: No  An unsuccessful telephone outreach was attempted today. The patient was referred to the case management team for assistance with care management and care coordination.   Follow Up Plan: A HIPAA compliant phone message was left for the patient providing contact information and requesting a return call. Telephone follow up appointment with care management team member scheduled for: 06/15/20  Barb Merino, RN, BSN, CCM Care Management Coordinator [facility]  Direct Phone: [phone]

## 2020-05-19 ENCOUNTER — Telehealth: Payer: Medicare Other

## 2020-05-19 ENCOUNTER — Telehealth: Payer: Self-pay

## 2020-05-19 NOTE — Telephone Encounter (Cosign Needed)
  Chronic Care Management   Outreach Note  05/19/2020 Name: Sara Fox MRN: [id number] DOB: 04/23/1941  Referred by: [name], FNP Reason for referral : No chief complaint on file.   Voice message received from son [name] stating he is returning my call. He stated his mother is doing well and is scheduled with the lung specialist on 05/21/20 to f/u on her CT scan. The patient was referred to the case management team for assistance with care management and care coordination.   Follow Up Plan: Telephone follow up appointment with care management team member scheduled for: 06/15/20  Barb Merino, RN, BSN, CCM Care Management Coordinator [place] [facility]  Direct Phone: [phone]

## 2020-05-20 ENCOUNTER — Telehealth: Payer: Self-pay

## 2020-05-20 NOTE — Chronic Care Management (AMB) (Addendum)
Chronic Care Management [phone] Pharmacy Assistant   Name: Sara Fox  MRN: DOB: 07-Jan-1941  Reason for Encounter: Hypertension, Hyperlipidemia, COPD Adherence Call  Patient Questions:  1.  Have you seen any other providers since your last visit? Yes, 01/20/20-Humble, Kendra (CCM). 02/11/20-Humble Kendra (CCM). 04/01/20-Stover, Titorya, DPM (OV). 04/14/20-Little, Claudette Stapler, RN (CCM). 04/22/20-Moore, Doreene Burke, FNP (OV). 04/28/20-Humble, Kendra (OV).      2.  Any changes in your medicines or health? No    PCP : [name], FNP  Allergies:   Allergies  Allergen Reactions   Buprenorphine Hcl Shortness Of Breath    Throat swelling/trouble breathing and lethargic   Morphine And Related Shortness Of Breath    Throat swelling/trouble breathing and lethargic   Celebrex [Celecoxib] Diarrhea and Swelling    Swelling of legs    Vioxx [Rofecoxib] Palpitations   Codeine Other (See Comments)    Bloated     Medications: Outpatient Encounter Medications as of 05/20/2020  Medication Sig   aspirin EC 81 MG tablet Take 81 mg by mouth daily.   CALCIUM CITRATE PO Take 1 tablet by mouth daily.    Cholecalciferol (VITAMIN D3) 2000 UNITS capsule Take 2,000 Units by mouth daily.   ciclopirox (PENLAC) 8 % solution Apply topically at bedtime. Apply over nail and surrounding skin. Apply daily over previous coat. After seven (7) days, file nail and continue cycle.   [employer] MAGNESIUM OXIDE 250 MG TABS SMARTSIG:1 Tablet(s) By Mouth Every Evening   donepezil (ARICEPT) 5 MG tablet TAKE 1 TABLET BY MOUTH EVERYDAY AT BEDTIME   furosemide (LASIX) 20 MG tablet TAKE 1 TABLET BY MOUTH EVERY OTHER DAY   gabapentin (NEURONTIN) 100 MG capsule TAKE 1 CAPSULE BY MOUTH THREE TIMES DAILY   metoprolol tartrate (LOPRESSOR) 25 MG tablet Take 1 tablet (25 mg total) by mouth 2 (two) times daily.   Multiple Vitamin (MULTIVITAMIN) capsule Take 1 capsule by mouth daily. Contains iron   nitroGLYCERIN (NITROSTAT) 0.4 MG SL  tablet PLACE ONE TABLET UNDER THE TONGUE EVERY 5 MINUTES AS NEEDED FOR CHEST PAIN.   oxybutynin (DITROPAN) 5 MG tablet TAKE 1 TABLET BY MOUTH TWICE A DAY   pantoprazole (PROTONIX) 40 MG tablet TAKE 1 TABLET BY MOUTH TWICE A DAY   polyethylene glycol (MIRALAX / GLYCOLAX) packet Take 17 g by mouth daily as needed for mild constipation.    Potassium Chloride ER 20 MEQ TBCR TAKE 2 TABLETS BY MOUTH EVERY MORNING   rosuvastatin (CRESTOR) 40 MG tablet TAKE 1 TABLET BY MOUTH ONCE DAILY   SYMBICORT 160-4.5 MCG/ACT inhaler TAKE 2 PUFFS BY MOUTH TWICE A DAY   traMADol (ULTRAM) 50 MG tablet TAKE 1 TABLETS BY MOUTH THREE TIMES DAILY(on 20 days ea month she is allowed to take an extra tablet in which case she is receiving 140 tablets/month)   vitamin C (ASCORBIC ACID) 500 MG tablet Take 500 mg by mouth daily.   No facility-administered encounter medications on file as of 05/20/2020.    Current Diagnosis: Patient Active Problem List   Diagnosis Date Noted   Atherosclerotic heart disease of native coronary artery with other forms of angina pectoris ([place]) 04/22/2020   Fatigue 02/18/2019   Memory loss 02/18/2019   Vitamin B12 deficiency 10/23/2018   Hypotension 10/23/2018   Stage 3 chronic kidney disease ([place]) 06/03/2018   Chronic anemia 06/03/2018   Hypercholesterolemia 06/03/2018   Malnutrition of moderate degree 07/12/2017   Hypoalbuminemia 07/11/2017   Elevated LFTs 07/11/2017   Closed fracture of  left distal radius 05/28/2017   Snoring 12/21/2016   OSA and COPD overlap syndrome ([place]) 12/21/2016   Peripheral musculoskeletal gait disorder 02/05/2015   Lumbar post-laminectomy syndrome 02/05/2015   CAD in native artery 10/16/2014   NSTEMI (non-ST elevated myocardial infarction) ([place]) 09/13/2014   Cardiomyopathy, ischemic 84/69/6295   Chronic systolic heart failure ([place])    Tobacco abuse 09/12/2014   Dyslipidemia 09/12/2014   CKD (chronic kidney disease), stage II 09/12/2014   Normocytic anemia  09/12/2014   Chronic diastolic heart failure, NYHA class 1 ([place]) 09/12/2014   Postlaminectomy syndrome, cervical region 08/01/2013   Chronic midline low back pain with left-sided sciatica 08/01/2013   Shoulder joint contracture 08/01/2013   HTN (hypertension) 03/03/2013   Abnormal genetic test 03/03/2013   Reviewed chart prior to disease state call. Spoke with patient regarding BP  Recent Office Vitals: BP Readings from Last 3 Encounters:  04/22/20 138/70  01/01/20 128/70  12/11/19 (!) 130/74   Pulse Readings from Last 3 Encounters:  04/22/20 88  01/01/20 64  12/11/19 60    Wt Readings from Last 3 Encounters:  04/22/20 130 lb 9.6 oz (59.2 kg)  01/01/20 127 lb 12.8 oz (58 kg)  12/11/19 130 lb 6.4 oz (59.1 kg)     Kidney Function Lab Results  Component Value Date/Time   CREATININE 1.27 (H) 04/22/2020 09:57 AM   CREATININE 1.04 (H) 12/11/2019 12:03 PM   GFRNONAA 40 (L) 04/22/2020 09:57 AM   GFRAA 46 (L) 04/22/2020 09:57 AM    BMP Latest Ref Rng & Units 04/22/2020 12/11/2019 07/10/2019  Glucose 65 - 99 mg/dL 85 94 88  BUN 8 - 27 mg/dL 19 18 21   Creatinine 0.57 - 1.00 mg/dL 1.27(H) 1.04(H) 1.38(H)  BUN/Creat Ratio 12 - 28 15 17 15   Sodium 134 - 144 mmol/L 143 142 142  Potassium 3.5 - 5.2 mmol/L 4.8 4.5 4.4  Chloride 96 - 106 mmol/L 111(H) 109(H) 109(H)  CO2 20 - 29 mmol/L 19(L) 20 19(L)  Calcium 8.7 - 10.3 mg/dL 9.1 9.6 9.5    Current antihypertensive regimen:  Aspirin 81 mg daily. Nitrostat 0.4 SL tablet PRN Lopressor 25 mg tablet 2 times daily Lasix 20 mg tablet every other day  How often are you checking your Blood Pressure? when feeling symptomatic   Current home BP readings: Per patient's son; 120/83 05/21/20.  What recent interventions/DTPs have been made by any provider to improve Blood Pressure control since last CPP Visit:  Per patient's son; patient is taking blood pressure medication as directed.  Any recent hospitalizations or ED visits since last visit  with CPP? No   What diet changes have been made to improve Blood Pressure Control?  Per patient's son; the patient had sausage, eggs and grits with gravy. For dinner; the patient had beef stew with rice and corn on the cob. Patient's son states he is trying to have Sara Fox cut back on cheese and butter.  What exercise is being done to improve your Blood Pressure Control?  Per patient's son; the patient is physically active while doing house chores, and with yard work.  Adherence Review: Is the patient currently on ACE/ARB medication? No Does the patient have >5 day gap between last estimated fill dates? No   05/20/2020 Name: Sara Fox MRN: [id number] DOB: [birth year] Sara Fox is a 80 y.o. year old female who is a primary care patient of [name], [place].  Comprehensive medication review performed; Spoke to patient regarding cholesterol  Lipid Panel  Component Value Date/Time   CHOL 162 04/22/2020 0957   TRIG 122 04/22/2020 0957   HDL 48 04/22/2020 0957   LDLCALC 92 04/22/2020 0957    10-year ASCVD risk score: The ASCVD Risk score Mikey Bussing DC Jr., et al., 2013) failed to calculate for the following reasons:   The 2013 ASCVD risk score is only valid for ages 25 to 16   The patient has a prior MI or stroke diagnosis  Current antihyperlipidemic regimen:  Rosuvastatin 40 mg Tablet daily.  Previous antihyperlipidemic medications tried: None  ASCVD risk enhancing conditions: age >85, HTN, CKD, CHF and current smoker   What recent interventions/DTPs have been made by any provider to improve Cholesterol control since last CPP Visit:  Per patient's son; the patient is taking her cholesterol medication as directed.  Any recent hospitalizations or ED visits since last visit with CPP? No   What diet changes have been made to improve Cholesterol?  Per patient's son; the patient had sausage, eggs and grits with gravy. For dinner; the patient had beef stew with rice and corn on the  cob. Patient's son states he is trying to have Sara Fox cut back on cheese and butter.  What exercise is being done to improve Cholesterol?  Per patient's son; the patient is physically active while doing house chores, and with yard work.  Adherence Review: Does the patient have >5 day gap between last estimated fill dates? No    Current COPD regimen: Symbicort 160-4.5 MCG  Any recent hospitalizations or ED visits since last visit with CPP? No   Denies COPD symptoms, including Increased shortness of breath , Rescue medicine is not helping, Shortness of breath at rest, Symptoms worse with exercise, Symptoms worse at night and Wheezing   What recent interventions/DTPs have been made by any provider to improve breathing since last visit:Per patient's son; patient is taking her COPD medication as directed.  Have you had exacerbation/flare-up since last visit? No   What do you do when you are short of breath?  Rest; Patient's son states the patient will use her Maintenance Inhaler and take a minute to her breathing is back to normal.  Respiratory Devices/Equipment Do you have a nebulizer? No; per patient's son. Do you use a Peak Flow Meter? No; per patient's son. Do you use a maintenance inhaler? Yes; per patient's son. How often do you forget to use your daily inhaler? Never; per patient's son. Do you use a rescue inhaler? No; per patient's son. How often do you use your rescue inhaler?  never; per patient's son. Do you use a spacer with your inhaler? No; per patient's son.  Adherence Review: Does the patient have >5 day gap between last estimated fill date for maintenance inhaler medications? No   Goals Addressed   None     Follow-Up:  Pharmacist Review-Per patient's son; the patient is scheduled to have a bronchoscopy procedure done 05/25/2020 with Lamonte Sakai Rose Fillers, MD.    Notified Orlando Penner, CPP.   Raynelle Highland, [place] Pharmacist Assistant [phone]  I  have reviewed the care management and care coordination activities outlined in this encounter and I am certifying that I agree with the content of this note. No further action required.  4 minutes spent in review, coordination, and documentation.  Mayford Knife, [facility] 06/08/20 9:10 AM

## 2020-05-21 ENCOUNTER — Other Ambulatory Visit: Payer: Self-pay

## 2020-05-21 ENCOUNTER — Ambulatory Visit: Payer: Medicare Other | Admitting: Emergency Medicine

## 2020-05-21 ENCOUNTER — Encounter: Payer: Self-pay | Admitting: Emergency Medicine

## 2020-05-21 VITALS — BP 116/68 | HR 66 | Temp 97.6°F | Ht 63.0 in | Wt 128.6 lb

## 2020-05-21 DIAGNOSIS — R911 Solitary pulmonary nodule: Secondary | ICD-10-CM | POA: Diagnosis not present

## 2020-05-21 LAB — CBC WITH DIFFERENTIAL/PLATELET
Basophils Absolute: 0 10*3/uL (ref 0.0–0.1)
Basophils Relative: 0.3 % (ref 0.0–3.0)
Eosinophils Absolute: 0.1 10*3/uL (ref 0.0–0.7)
Eosinophils Relative: 1.3 % (ref 0.0–5.0)
HCT: 33.4 % — ABNORMAL LOW (ref 36.0–46.0)
Hemoglobin: 11 g/dL — ABNORMAL LOW (ref 12.0–15.0)
Lymphocytes Relative: 25.8 % (ref 12.0–46.0)
Lymphs Abs: 1.1 10*3/uL (ref 0.7–4.0)
MCHC: 32.8 g/dL (ref 30.0–36.0)
MCV: 94.4 fl (ref 78.0–100.0)
Monocytes Absolute: 0.5 10*3/uL (ref 0.1–1.0)
Monocytes Relative: 12.1 % — ABNORMAL HIGH (ref 3.0–12.0)
Neutro Abs: 2.7 10*3/uL (ref 1.4–7.7)
Neutrophils Relative %: 60.5 % (ref 43.0–77.0)
Platelets: 261 10*3/uL (ref 150.0–400.0)
RBC: 3.54 Mil/uL — ABNORMAL LOW (ref 3.87–5.11)
RDW: 13.7 % (ref 11.5–15.5)
WBC: 4.4 10*3/uL (ref 4.0–10.5)

## 2020-05-21 LAB — PROTIME-INR
INR: 1 ratio (ref 0.8–1.0)
Prothrombin Time: 10.9 s (ref 9.6–13.1)

## 2020-05-21 LAB — BASIC METABOLIC PANEL
BUN: 16 mg/dL (ref 6–23)
CO2: 29 mEq/L (ref 19–32)
Calcium: 9.5 mg/dL (ref 8.4–10.5)
Chloride: 107 mEq/L (ref 96–112)
Creatinine, Ser: 1.39 mg/dL — ABNORMAL HIGH (ref 0.40–1.20)
GFR: 35.79 mL/min — ABNORMAL LOW (ref >60.00)
Glucose, Bld: 90 mg/dL (ref 70–99)
Potassium: 4.4 mEq/L (ref 3.5–5.1)
Sodium: 140 mEq/L (ref 135–145)

## 2020-05-21 NOTE — Assessment & Plan Note (Signed)
Rounded right upper lobe pulmonary nodule with a partial cystic, partially solid component very concerning for primary malignancy especially given her tobacco history.  She denies any dyspnea.  She has been seen by her cardiologist in the last 6 months with a reassuring evaluation.  I think she can tolerate general anesthesia and have recommended bronchoscopy with navigation to her right upper lobe nodule and also endobronchial ultrasound to evaluate her lymphadenopathy.  She understands and agrees.  Her son will be able to come with her, provide transportation.  I will try to get this set up for next week.  We will arrange for bronchoscopy as an outpatient to evaluate your pulmonary nodule.  We will try to set this up for 05/25/2020.  You will receive more information. Lab work today.  Follow with Dr Lamonte Sakai in 1 month

## 2020-05-21 NOTE — H&P (View-Only) (Signed)
Subjective:    Patient ID: Sara Fox, female    DOB: 1941-02-06, 80 y.o.   MRN: [id number]  HPI 80 year old woman with history of tobacco use (110+ pack years, still 1 pk/day), carries a diagnosis of COPD.  She also has CAD, hypertension, hyperlipidemia, GERD, subtotal gastrectomy, history of cervical cancer.  She is on Symbicort 160/4.5, is using only prn.   She is referred today for abnormal CT chest. It was performed mainly as a screening exam given her hx smoking CT performed 05/11/2020 reviewed by me, shows a 9 mm short axis low right paratracheal lymph node, 2.4 x 2.3 cystic/solid nodule with a 15 mm solid component in the right lung apex, additional posterior right lung opacity question scar, 4 mm subpleural left superior segmental nodule  Does not have any breathing complaints. She is able to walk dogs, she does have SOB when climbing stairs. She is able to shop without limitation.   Review of Systems As per HPI  Past Medical History:  Diagnosis Date  . Acute kidney injury ([place])   . Back pain   . Cervical cancer ([place])   . CHF (congestive heart failure) ([place])   . COPD (chronic obstructive pulmonary disease) ([place])   . Coronary artery disease    s/p DES x2 to RCA 2016  . GERD (gastroesophageal reflux disease)   . Hyperlipidemia   . Hypertension   . Myocardial infarction ([place])   . Neck pain   . Rhabdomyolysis   . Stomach problems      Family History  Problem Relation Age of Onset  . Heart disease Mother        also HTN  . Diabetes Mother   . Colon cancer Mother   . Heart disease Brother        deceased at 6  . Hypertension Brother   . Heart attack Brother   . Heart disease Brother   . Hypertension Brother      Social History   Socioeconomic History  . Marital status: Married    Spouse name: Not on file  . Number of children: 2  . Years of education: GED  . Highest education level: Not on file  Occupational History    Employer: RETIRED  Tobacco Use   . Smoking status: Current Every Day Smoker    Packs/day: 0.50    Years: 60.00    Pack years: 30.00  . Smokeless tobacco: Never Used  . Tobacco comment: currently smokes 1/2 ppd or less  Vaping Use  . Vaping Use: Former  Substance and Sexual Activity  . Alcohol use: No  . Drug use: No  . Sexual activity: Not Currently  Other Topics Concern  . Not on file  Social History Narrative  . Not on file   Social Determinants of Health   Financial Resource Strain: Low Risk   . Difficulty of Paying Living Expenses: Not hard at all  Food Insecurity: No Food Insecurity  . Worried About Charity fundraiser in the Last Year: Never true  . Ran Out of Food in the Last Year: Never true  Transportation Needs: No Transportation Needs  . Lack of Transportation (Medical): No  . Lack of Transportation (Non-Medical): No  Physical Activity: Inactive  . Days of Exercise per Week: 0 days  . Minutes of Exercise per Session: 0 min  Stress: No Stress Concern Present  . Feeling of Stress : Not at all  Social Connections: Not on file  Intimate Partner  Violence: Not on file    No known TB or TB exposure    Allergies  Allergen Reactions  . Buprenorphine Hcl Shortness Of Breath    Throat swelling/trouble breathing and lethargic  . Morphine And Related Shortness Of Breath    Throat swelling/trouble breathing and lethargic  . Celebrex [Celecoxib] Diarrhea and Swelling    Swelling of legs   . Vioxx [Rofecoxib] Palpitations  . Codeine Other (See Comments)    Bloated      Outpatient Medications Prior to Visit  Medication Sig Dispense Refill  . aspirin EC 81 MG tablet Take 81 mg by mouth daily.    Marland Kitchen CALCIUM CITRATE PO Take 1 tablet by mouth daily.     . Cholecalciferol (VITAMIN D3) 2000 UNITS capsule Take 2,000 Units by mouth daily.    . ciclopirox (PENLAC) 8 % solution Apply topically at bedtime. Apply over nail and surrounding skin. Apply daily over previous coat. After seven (7) days, file nail  and continue cycle. 6.6 mL 0  . [employer]S MAGNESIUM OXIDE 250 MG TABS SMARTSIG:1 Tablet(s) By Mouth Every Evening    . donepezil (ARICEPT) 5 MG tablet TAKE 1 TABLET BY MOUTH EVERYDAY AT BEDTIME 90 tablet 1  . furosemide (LASIX) 20 MG tablet TAKE 1 TABLET BY MOUTH EVERY OTHER DAY 45 tablet 1  . gabapentin (NEURONTIN) 100 MG capsule TAKE 1 CAPSULE BY MOUTH THREE TIMES DAILY 270 capsule 0  . metoprolol tartrate (LOPRESSOR) 25 MG tablet Take 1 tablet (25 mg total) by mouth 2 (two) times daily. 90 tablet 3  . Multiple Vitamin (MULTIVITAMIN) capsule Take 1 capsule by mouth daily. Contains iron    . nitroGLYCERIN (NITROSTAT) 0.4 MG SL tablet PLACE ONE TABLET UNDER THE TONGUE EVERY 5 MINUTES AS NEEDED FOR CHEST PAIN. 50 tablet 0  . oxybutynin (DITROPAN) 5 MG tablet TAKE 1 TABLET BY MOUTH TWICE A DAY 180 tablet 1  . pantoprazole (PROTONIX) 40 MG tablet TAKE 1 TABLET BY MOUTH TWICE A DAY 180 tablet 1  . polyethylene glycol (MIRALAX / GLYCOLAX) packet Take 17 g by mouth daily as needed for mild constipation.     . Potassium Chloride ER 20 MEQ TBCR TAKE 2 TABLETS BY MOUTH EVERY MORNING 180 tablet 3  . rosuvastatin (CRESTOR) 40 MG tablet TAKE 1 TABLET BY MOUTH ONCE DAILY 30 tablet 9  . SYMBICORT 160-4.5 MCG/ACT inhaler TAKE 2 PUFFS BY MOUTH TWICE A DAY 30.6 each 1  . traMADol (ULTRAM) 50 MG tablet TAKE 1 TABLETS BY MOUTH THREE TIMES DAILY(on 20 days ea month she is allowed to take an extra tablet in which case she is receiving 140 tablets/month) 140 tablet 1  . vitamin C (ASCORBIC ACID) 500 MG tablet Take 500 mg by mouth daily.     No facility-administered medications prior to visit.        Objective:   Physical Exam Vitals:   05/21/20 0924  BP: 116/68  Pulse: 66  Temp: 97.6 F (36.4 C)  TempSrc: Temporal  SpO2: 98%  Weight: 128 lb 9.6 oz (58.3 kg)  Height: 5\' 3"  (1.6 m)   Gen: Pleasant, elderly woman, in no distress,  normal affect  ENT: No lesions,  mouth clear,  oropharynx clear, no postnasal  drip  Neck: No JVD, no stridor  Lungs: No use of accessory muscles, no crackles or wheezing on normal respiration, no wheeze on forced expiration  Cardiovascular: RRR, heart sounds normal, no murmur or gallops, no peripheral edema  Musculoskeletal: No deformities, no  cyanosis or clubbing  Neuro: alert, awake, non focal  Skin: Warm, no lesions or rash       Assessment & Plan:  Pulmonary nodule 1 cm or greater in diameter Rounded right upper lobe pulmonary nodule with a partial cystic, partially solid component very concerning for primary malignancy especially given her tobacco history.  She denies any dyspnea.  She has been seen by her cardiologist in the last 6 months with a reassuring evaluation.  I think she can tolerate general anesthesia and have recommended bronchoscopy with navigation to her right upper lobe nodule and also endobronchial ultrasound to evaluate her lymphadenopathy.  She understands and agrees.  Her son will be able to come with her, provide transportation.  I will try to get this set up for next week.  We will arrange for bronchoscopy as an outpatient to evaluate your pulmonary nodule.  We will try to set this up for 05/25/2020.  You will receive more information. Lab work today.  Follow with Dr Lamonte Sakai in 1 month  Baltazar Apo, MD, PhD 05/21/2020, 1:31 PM [facility] [phone] or if no answer [phone]

## 2020-05-21 NOTE — Patient Instructions (Signed)
We will arrange for bronchoscopy as an outpatient to evaluate your pulmonary nodule.  We will try to set this up for 05/25/2020.  You will receive more information. Lab work today.  Follow with Dr Lamonte Sakai in 1 month

## 2020-05-21 NOTE — Progress Notes (Signed)
Subjective:    Patient ID: Sara Fox, female    DOB: 1941/05/01, 80 y.o.   MRN: [id number]  HPI 80 year old woman with history of tobacco use (110+ pack years, still 1 pk/day), carries a diagnosis of COPD.  She also has CAD, hypertension, hyperlipidemia, GERD, subtotal gastrectomy, history of cervical cancer.  She is on Symbicort 160/4.5, is using only prn.   She is referred today for abnormal CT chest. It was performed mainly as a screening exam given her hx smoking CT performed 05/11/2020 reviewed by me, shows a 9 mm short axis low right paratracheal lymph node, 2.4 x 2.3 cystic/solid nodule with a 15 mm solid component in the right lung apex, additional posterior right lung opacity question scar, 4 mm subpleural left superior segmental nodule  Does not have any breathing complaints. She is able to walk dogs, she does have SOB when climbing stairs. She is able to shop without limitation.   Review of Systems As per HPI  Past Medical History:  Diagnosis Date  . Acute kidney injury ([place])   . Back pain   . Cervical cancer ([place])   . CHF (congestive heart failure) ([place])   . COPD (chronic obstructive pulmonary disease) ([place])   . Coronary artery disease    s/p DES x2 to RCA 2016  . GERD (gastroesophageal reflux disease)   . Hyperlipidemia   . Hypertension   . Myocardial infarction ([place])   . Neck pain   . Rhabdomyolysis   . Stomach problems      Family History  Problem Relation Age of Onset  . Heart disease Mother        also HTN  . Diabetes Mother   . Colon cancer Mother   . Heart disease Brother        deceased at 59  . Hypertension Brother   . Heart attack Brother   . Heart disease Brother   . Hypertension Brother      Social History   Socioeconomic History  . Marital status: Married    Spouse name: Not on file  . Number of children: 2  . Years of education: GED  . Highest education level: Not on file  Occupational History    Employer: RETIRED  Tobacco Use   . Smoking status: Current Every Day Smoker    Packs/day: 0.50    Years: 60.00    Pack years: 30.00  . Smokeless tobacco: Never Used  . Tobacco comment: currently smokes 1/2 ppd or less  Vaping Use  . Vaping Use: Former  Substance and Sexual Activity  . Alcohol use: No  . Drug use: No  . Sexual activity: Not Currently  Other Topics Concern  . Not on file  Social History Narrative  . Not on file   Social Determinants of Health   Financial Resource Strain: Low Risk   . Difficulty of Paying Living Expenses: Not hard at all  Food Insecurity: No Food Insecurity  . Worried About Charity fundraiser in the Last Year: Never true  . Ran Out of Food in the Last Year: Never true  Transportation Needs: No Transportation Needs  . Lack of Transportation (Medical): No  . Lack of Transportation (Non-Medical): No  Physical Activity: Inactive  . Days of Exercise per Week: 0 days  . Minutes of Exercise per Session: 0 min  Stress: No Stress Concern Present  . Feeling of Stress : Not at all  Social Connections: Not on file  Intimate Partner  Violence: Not on file    No known TB or TB exposure    Allergies  Allergen Reactions  . Buprenorphine Hcl Shortness Of Breath    Throat swelling/trouble breathing and lethargic  . Morphine And Related Shortness Of Breath    Throat swelling/trouble breathing and lethargic  . Celebrex [Celecoxib] Diarrhea and Swelling    Swelling of legs   . Vioxx [Rofecoxib] Palpitations  . Codeine Other (See Comments)    Bloated      Outpatient Medications Prior to Visit  Medication Sig Dispense Refill  . aspirin EC 81 MG tablet Take 81 mg by mouth daily.    Marland Kitchen CALCIUM CITRATE PO Take 1 tablet by mouth daily.     . Cholecalciferol (VITAMIN D3) 2000 UNITS capsule Take 2,000 Units by mouth daily.    . ciclopirox (PENLAC) 8 % solution Apply topically at bedtime. Apply over nail and surrounding skin. Apply daily over previous coat. After seven (7) days, file nail  and continue cycle. 6.6 mL 0  . [employer] MAGNESIUM OXIDE 250 MG TABS SMARTSIG:1 Tablet(s) By Mouth Every Evening    . donepezil (ARICEPT) 5 MG tablet TAKE 1 TABLET BY MOUTH EVERYDAY AT BEDTIME 90 tablet 1  . furosemide (LASIX) 20 MG tablet TAKE 1 TABLET BY MOUTH EVERY OTHER DAY 45 tablet 1  . gabapentin (NEURONTIN) 100 MG capsule TAKE 1 CAPSULE BY MOUTH THREE TIMES DAILY 270 capsule 0  . metoprolol tartrate (LOPRESSOR) 25 MG tablet Take 1 tablet (25 mg total) by mouth 2 (two) times daily. 90 tablet 3  . Multiple Vitamin (MULTIVITAMIN) capsule Take 1 capsule by mouth daily. Contains iron    . nitroGLYCERIN (NITROSTAT) 0.4 MG SL tablet PLACE ONE TABLET UNDER THE TONGUE EVERY 5 MINUTES AS NEEDED FOR CHEST PAIN. 50 tablet 0  . oxybutynin (DITROPAN) 5 MG tablet TAKE 1 TABLET BY MOUTH TWICE A DAY 180 tablet 1  . pantoprazole (PROTONIX) 40 MG tablet TAKE 1 TABLET BY MOUTH TWICE A DAY 180 tablet 1  . polyethylene glycol (MIRALAX / GLYCOLAX) packet Take 17 g by mouth daily as needed for mild constipation.     . Potassium Chloride ER 20 MEQ TBCR TAKE 2 TABLETS BY MOUTH EVERY MORNING 180 tablet 3  . rosuvastatin (CRESTOR) 40 MG tablet TAKE 1 TABLET BY MOUTH ONCE DAILY 30 tablet 9  . SYMBICORT 160-4.5 MCG/ACT inhaler TAKE 2 PUFFS BY MOUTH TWICE A DAY 30.6 each 1  . traMADol (ULTRAM) 50 MG tablet TAKE 1 TABLETS BY MOUTH THREE TIMES DAILY(on 20 days ea month she is allowed to take an extra tablet in which case she is receiving 140 tablets/month) 140 tablet 1  . vitamin C (ASCORBIC ACID) 500 MG tablet Take 500 mg by mouth daily.     No facility-administered medications prior to visit.        Objective:   Physical Exam Vitals:   05/21/20 0924  BP: 116/68  Pulse: 66  Temp: 97.6 F (36.4 C)  TempSrc: Temporal  SpO2: 98%  Weight: 128 lb 9.6 oz (58.3 kg)  Height: 5\' 3"  (1.6 m)   Gen: Pleasant, elderly woman, in no distress,  normal affect  ENT: No lesions,  mouth clear,  oropharynx clear, no postnasal  drip  Neck: No JVD, no stridor  Lungs: No use of accessory muscles, no crackles or wheezing on normal respiration, no wheeze on forced expiration  Cardiovascular: RRR, heart sounds normal, no murmur or gallops, no peripheral edema  Musculoskeletal: No deformities, no  cyanosis or clubbing  Neuro: alert, awake, non focal  Skin: Warm, no lesions or rash       Assessment & Plan:  Pulmonary nodule 1 cm or greater in diameter Rounded right upper lobe pulmonary nodule with a partial cystic, partially solid component very concerning for primary malignancy especially given her tobacco history.  She denies any dyspnea.  She has been seen by her cardiologist in the last 6 months with a reassuring evaluation.  I think she can tolerate general anesthesia and have recommended bronchoscopy with navigation to her right upper lobe nodule and also endobronchial ultrasound to evaluate her lymphadenopathy.  She understands and agrees.  Her son will be able to come with her, provide transportation.  I will try to get this set up for next week.  We will arrange for bronchoscopy as an outpatient to evaluate your pulmonary nodule.  We will try to set this up for 05/25/2020.  You will receive more information. Lab work today.  Follow with Dr Lamonte Sakai in 1 month  Baltazar Apo, MD, PhD 05/21/2020, 1:31 PM [facility] [phone] or if no answer [phone]

## 2020-05-22 ENCOUNTER — Other Ambulatory Visit (HOSPITAL_COMMUNITY)
Admission: RE | Admit: 2020-05-22 | Discharge: 2020-05-22 | Disposition: A | Discharge status: Home / Self Care | Payer: Medicare Other | Source: Ambulatory Visit | Attending: Emergency Medicine | Admitting: Emergency Medicine

## 2020-05-22 DIAGNOSIS — Z20822 Contact with and (suspected) exposure to covid-19: Secondary | ICD-10-CM | POA: Diagnosis not present

## 2020-05-22 DIAGNOSIS — Z01812 Encounter for preprocedural laboratory examination: Secondary | ICD-10-CM | POA: Insufficient documentation

## 2020-05-22 LAB — QUANTIFERON-TB GOLD PLUS
Mitogen-NIL: 9.63 IU/mL
NIL: 0.04 IU/mL
QuantiFERON-TB Gold Plus: NEGATIVE
TB1-NIL: 0 IU/mL
TB2-NIL: 0.04 IU/mL

## 2020-05-23 LAB — SARS CORONAVIRUS 2 (TAT 6-24 HRS): SARS Coronavirus 2: NEGATIVE

## 2020-05-24 ENCOUNTER — Encounter (HOSPITAL_COMMUNITY): Payer: Self-pay | Admitting: Emergency Medicine

## 2020-05-24 NOTE — Progress Notes (Signed)
Anesthesia Chart Review: Same day workup  Follows with cardiologist Dr. Debara Pickett for history of CAD status post DES to the RCA 09/2014.  Echo 07/04/2017 showed EF 60 to 65%, normal wall motion, grade 1 DD, no significant valvular abnormalities.  Nuclear stress 12/02/2017 showed no ischemia, mildly reduced LVEF.  Last seen by Dr. Debara Pickett 12/08/2019 and was doing well from a cardiac standpoint.  Per note, "Mrs. Liscano seems to be doing well without any recurrent chest pain symptoms.  Is now 5 years since her drug-eluting stent.  Her cholesterol have been well controlled last year with LDL less than 70.  She has upcoming labs in a few days with her PCP.  I will look out for those.  Blood pressure is well controlled.  No changes to her medicines today.  Follow-up with me annually or sooner as necessary."  COPD with 110+ pack year smoking history, maintained on Symbicort.  History of GERD and subtotal gastrectomy.  Mild cognitive decline.  BMP and CBC from 05/21/2018 reviewed, creatinine mildly elevated 1.39, hemoglobin mildly low 11.0, otherwise unremarkable.  EKG 12/08/2019: NSR.  Rate 65.  CT chest 05/11/2020: IMPRESSION: 2.3 cm cystic/solid nodule with 15 mm solid component in the right lung apex, suspicious for primary bronchogenic neoplasm such as invasive adenocarcinoma. PET-CT is suggested for further evaluation.  9 mm short axis low right paratracheal node, within the upper limits of normal.  Aortic Atherosclerosis (ICD10-I70.0) and Emphysema (ICD10-J43.9).  These results will be called to the ordering clinician or representative by the Radiologist Assistant, and communication documented in the PACS or [employer].  Nuclear stress 12/12/2017:  The left ventricular ejection fraction is mildly decreased (45-54%).  Nuclear stress EF: 48%.  No T wave inversion was noted during stress.  There was no ST segment deviation noted during stress.  This is a low risk study.   No  reversible ischemia. LVEF 48% with mild basal to mid inferior wall hypokinesis. This is an intermediate risk study due to decreased LV function.   TTE 07/12/2017: - Left ventricle: The cavity size was normal. There was mild  concentric hypertrophy. Systolic function was normal. The  estimated ejection fraction was in the range of 60% to 65%. Wall  motion was normal; there were no regional wall motion  abnormalities. Doppler parameters are consistent with abnormal  left ventricular relaxation (grade 1 diastolic dysfunction).  - Mitral valve: Calcified annulus.  - Atrial septum: No defect or patent foramen ovale was identified.    Wynonia Musty [facility]/Anesthesiology Phone [phone] 05/24/2020 2:45 PM

## 2020-05-24 NOTE — Progress Notes (Signed)
Patient denies chest pain or shortness of breath. Dr. Debara Pickett cardiologist. Cardiac test in [place]. Reports being in quarantine since COVID test. Denies COVID symptoms or exposure to COVID. Will call if same occurs.

## 2020-05-24 NOTE — Anesthesia Preprocedure Evaluation (Addendum)
Anesthesia Evaluation  Patient identified by MRN, date of birth, ID band Patient awake    Reviewed: Allergy & Precautions, NPO status , Patient's Chart, lab work & pertinent test results, reviewed documented beta blocker date and time   Airway Mallampati: II  TM Distance: >3 FB Neck ROM: Full    Dental  (+) Edentulous Upper, Edentulous Lower   Pulmonary sleep apnea , COPD,  COPD inhaler, Current Smoker and Patient abstained from smoking.,  LUL pulmonary nodule   Pulmonary exam normal breath sounds clear to auscultation       Cardiovascular hypertension, Pt. on medications + angina with exertion + CAD, + Past MI, + Cardiac Stents and +CHF  Normal cardiovascular exam Rhythm:Regular Rate:Normal  Stents x 2 RCA, D1 2016  LVEF 48% last Echo 12/13/2019   Neuro/Psych Mild cognitive impairment Neuromuscular disease    GI/Hepatic Neg liver ROS, GERD  Medicated,  Endo/Other  Hyperlipidemia  Renal/GU Renal InsufficiencyRenal disease  negative genitourinary   Musculoskeletal  (+) Arthritis , Osteoarthritis,  Post laminectomy syndrome- lumbar   Abdominal   Peds  Hematology  (+) anemia ,   Anesthesia Other Findings   Reproductive/Obstetrics                            Anesthesia Physical Anesthesia Plan  ASA: III  Anesthesia Plan: General   Post-op Pain Management:    Induction: Intravenous  PONV Risk Score and Plan: 3 and Treatment may vary due to age or medical condition and Ondansetron  Airway Management Planned: Oral ETT  Additional Equipment:   Intra-op Plan:   Post-operative Plan: Extubation in OR  Informed Consent: I have reviewed the patients History and Physical, chart, labs and discussed the procedure including the risks, benefits and alternatives for the proposed anesthesia with the patient or authorized representative who has indicated his/her understanding and acceptance.      Dental advisory given  Plan Discussed with: CRNA and Anesthesiologist  Anesthesia Plan Comments: (PAT note by Karoline Caldwell, PA-C: Follows with cardiologist Dr. Debara Pickett for history of CAD status post DES to the RCA 09/2014.  Echo 07/04/2017 showed EF 60 to 65%, normal wall motion, grade 1 DD, no significant valvular abnormalities.  Nuclear stress 12/02/2017 showed no ischemia, mildly reduced LVEF.  Last seen by Dr. Debara Pickett 12/08/2019 and was doing well from a cardiac standpoint.  Per note, "Mrs. Malicoat seems to be doing well without any recurrent chest pain symptoms.  Is now 5 years since her drug-eluting stent.  Her cholesterol have been well controlled last year with LDL less than 70.  She has upcoming labs in a few days with her PCP.  I will look out for those.  Blood pressure is well controlled.  No changes to her medicines today.  Follow-up with me annually or sooner as necessary."  COPD with 110+ pack year smoking history, maintained on Symbicort.  History of GERD and subtotal gastrectomy.  Mild cognitive decline.  BMP and CBC from 05/21/2018 reviewed, creatinine mildly elevated 1.39, hemoglobin mildly low 11.0, otherwise unremarkable.  EKG 12/08/2019: NSR.  Rate 65.  CT chest 05/11/2020: IMPRESSION: 2.3 cm cystic/solid nodule with 15 mm solid component in the right lung apex, suspicious for primary bronchogenic neoplasm such as invasive adenocarcinoma. PET-CT is suggested for further evaluation.  9 mm short axis low right paratracheal node, within the upper limits of normal.  Aortic Atherosclerosis (ICD10-I70.0) and Emphysema (ICD10-J43.9).  These results will be called to  the ordering clinician or representative by the Radiologist Assistant, and communication documented in the PACS or [employer].  Nuclear stress 12/12/2017: The left ventricular ejection fraction is mildly decreased (45-54%). Nuclear stress EF: 48%. No T wave inversion was noted during stress. There was  no ST segment deviation noted during stress. This is a low risk study.   No reversible ischemia. LVEF 48% with mild basal to mid inferior wall hypokinesis. This is an intermediate risk study due to decreased LV function.   TTE 07/12/2017: - Left ventricle: The cavity size was normal. There was mild  concentric hypertrophy. Systolic function was normal. The  estimated ejection fraction was in the range of 60% to 65%. Wall  motion was normal; there were no regional wall motion  abnormalities. Doppler parameters are consistent with abnormal  left ventricular relaxation (grade 1 diastolic dysfunction).  - Mitral valve: Calcified annulus.  - Atrial septum: No defect or patent foramen ovale was identified.   )       Anesthesia Quick Evaluation

## 2020-05-25 ENCOUNTER — Ambulatory Visit (HOSPITAL_COMMUNITY)
Admission: RE | Admit: 2020-05-25 | Discharge: 2020-05-25 | Disposition: A | Discharge status: Home / Self Care | Payer: Medicare Other | Attending: Emergency Medicine | Admitting: Emergency Medicine

## 2020-05-25 ENCOUNTER — Other Ambulatory Visit: Payer: Self-pay

## 2020-05-25 ENCOUNTER — Ambulatory Visit (HOSPITAL_COMMUNITY): Payer: Medicare Other

## 2020-05-25 ENCOUNTER — Ambulatory Visit (HOSPITAL_COMMUNITY): Payer: Medicare Other | Admitting: Physician Assistant

## 2020-05-25 ENCOUNTER — Encounter (HOSPITAL_COMMUNITY): Payer: Self-pay | Admitting: Emergency Medicine

## 2020-05-25 ENCOUNTER — Encounter (HOSPITAL_COMMUNITY)
Admission: RE | Disposition: A | Discharge status: Home / Self Care | Payer: Self-pay | Source: Home / Self Care | Attending: Emergency Medicine

## 2020-05-25 DIAGNOSIS — E785 Hyperlipidemia, unspecified: Secondary | ICD-10-CM | POA: Insufficient documentation

## 2020-05-25 DIAGNOSIS — I10 Essential (primary) hypertension: Secondary | ICD-10-CM | POA: Diagnosis not present

## 2020-05-25 DIAGNOSIS — Z8541 Personal history of malignant neoplasm of cervix uteri: Secondary | ICD-10-CM | POA: Insufficient documentation

## 2020-05-25 DIAGNOSIS — J439 Emphysema, unspecified: Secondary | ICD-10-CM | POA: Insufficient documentation

## 2020-05-25 DIAGNOSIS — Z885 Allergy status to narcotic agent status: Secondary | ICD-10-CM | POA: Insufficient documentation

## 2020-05-25 DIAGNOSIS — R911 Solitary pulmonary nodule: Secondary | ICD-10-CM | POA: Diagnosis not present

## 2020-05-25 DIAGNOSIS — Z886 Allergy status to analgesic agent status: Secondary | ICD-10-CM | POA: Diagnosis not present

## 2020-05-25 DIAGNOSIS — I2510 Atherosclerotic heart disease of native coronary artery without angina pectoris: Secondary | ICD-10-CM | POA: Insufficient documentation

## 2020-05-25 DIAGNOSIS — K219 Gastro-esophageal reflux disease without esophagitis: Secondary | ICD-10-CM | POA: Insufficient documentation

## 2020-05-25 DIAGNOSIS — F17200 Nicotine dependence, unspecified, uncomplicated: Secondary | ICD-10-CM | POA: Diagnosis not present

## 2020-05-25 DIAGNOSIS — Z79899 Other long term (current) drug therapy: Secondary | ICD-10-CM | POA: Diagnosis not present

## 2020-05-25 DIAGNOSIS — R590 Localized enlarged lymph nodes: Secondary | ICD-10-CM | POA: Insufficient documentation

## 2020-05-25 DIAGNOSIS — Z903 Acquired absence of stomach [part of]: Secondary | ICD-10-CM | POA: Insufficient documentation

## 2020-05-25 DIAGNOSIS — Z888 Allergy status to other drugs, medicaments and biological substances status: Secondary | ICD-10-CM | POA: Diagnosis not present

## 2020-05-25 DIAGNOSIS — Z7982 Long term (current) use of aspirin: Secondary | ICD-10-CM | POA: Diagnosis not present

## 2020-05-25 DIAGNOSIS — Z9889 Other specified postprocedural states: Secondary | ICD-10-CM

## 2020-05-25 DIAGNOSIS — I700 Atherosclerosis of aorta: Secondary | ICD-10-CM | POA: Insufficient documentation

## 2020-05-25 DIAGNOSIS — C3411 Malignant neoplasm of upper lobe, right bronchus or lung: Secondary | ICD-10-CM | POA: Diagnosis not present

## 2020-05-25 HISTORY — PX: BRONCHIAL BIOPSY: SHX5109

## 2020-05-25 HISTORY — PX: VIDEO BRONCHOSCOPY WITH ENDOBRONCHIAL ULTRASOUND: SHX6177

## 2020-05-25 HISTORY — PX: VIDEO BRONCHOSCOPY WITH ENDOBRONCHIAL NAVIGATION: SHX6175

## 2020-05-25 HISTORY — DX: Mild cognitive impairment of uncertain or unknown etiology: G31.84

## 2020-05-25 HISTORY — PX: FINE NEEDLE ASPIRATION: SHX5430

## 2020-05-25 HISTORY — PX: BRONCHIAL BRUSHINGS: SHX5108

## 2020-05-25 HISTORY — PX: BRONCHIAL NEEDLE ASPIRATION BIOPSY: SHX5106

## 2020-05-25 SURGERY — BRONCHOSCOPY, WITH EBUS
Anesthesia: General

## 2020-05-25 MED ORDER — DEXAMETHASONE SODIUM PHOSPHATE 10 MG/ML IJ SOLN
INTRAMUSCULAR | Status: DC | PRN
  Administered 2020-05-25: 6 mg via INTRAVENOUS

## 2020-05-25 MED ORDER — GABAPENTIN 100 MG PO CAPS: 100.0000 mg | ORAL_CAPSULE | Freq: Two times a day (BID) | ORAL | Status: DC

## 2020-05-25 MED ORDER — CICLOPIROX 8 % EX SOLN: 1.0000 "application " | Freq: Every day | CUTANEOUS | Status: AC

## 2020-05-25 MED ORDER — FENTANYL CITRATE (PF) 100 MCG/2ML IJ SOLN: 25.0000 ug | INTRAMUSCULAR | Status: DC | PRN

## 2020-05-25 MED ORDER — ONDANSETRON HCL 4 MG/2ML IJ SOLN: 4.0000 mg | Freq: Once | INTRAMUSCULAR | Status: DC | PRN

## 2020-05-25 MED ORDER — METOPROLOL TARTRATE 12.5 MG HALF TABLET
ORAL_TABLET | ORAL | Status: AC
  Filled 2020-05-25: qty 2

## 2020-05-25 MED ORDER — ONDANSETRON HCL 4 MG/2ML IJ SOLN
INTRAMUSCULAR | Status: DC | PRN
  Administered 2020-05-25: 4 mg via INTRAVENOUS

## 2020-05-25 MED ORDER — ROSUVASTATIN CALCIUM 40 MG PO TABS: 40.0000 mg | ORAL_TABLET | Freq: Every evening | ORAL | Status: DC

## 2020-05-25 MED ORDER — METOPROLOL TARTRATE 25 MG PO TABS
25.0000 mg | ORAL_TABLET | Freq: Once | ORAL | Status: AC
  Administered 2020-05-25: 25 mg via ORAL

## 2020-05-25 MED ORDER — ROCURONIUM BROMIDE 10 MG/ML (PF) SYRINGE
PREFILLED_SYRINGE | INTRAVENOUS | Status: DC | PRN
  Administered 2020-05-25: 50 mg via INTRAVENOUS

## 2020-05-25 MED ORDER — TRAMADOL HCL 50 MG PO TABS: 50.0000 mg | ORAL_TABLET | Freq: Three times a day (TID) | ORAL | Status: DC | PRN

## 2020-05-25 MED ORDER — FUROSEMIDE 20 MG PO TABS: 20.0000 mg | ORAL_TABLET | ORAL | Status: DC

## 2020-05-25 MED ORDER — PROPOFOL 10 MG/ML IV BOLUS
INTRAVENOUS | Status: DC | PRN
  Administered 2020-05-25: 100 mg via INTRAVENOUS

## 2020-05-25 MED ORDER — LACTATED RINGERS IV SOLN: INTRAVENOUS | Status: DC

## 2020-05-25 MED ORDER — FENTANYL CITRATE (PF) 100 MCG/2ML IJ SOLN
INTRAMUSCULAR | Status: DC | PRN
  Administered 2020-05-25 (×4): 25 ug via INTRAVENOUS

## 2020-05-25 MED ORDER — LIDOCAINE 2% (20 MG/ML) 5 ML SYRINGE
INTRAMUSCULAR | Status: DC | PRN
  Administered 2020-05-25: 60 mg via INTRAVENOUS

## 2020-05-25 MED ORDER — SUGAMMADEX SODIUM 200 MG/2ML IV SOLN
INTRAVENOUS | Status: DC | PRN
  Administered 2020-05-25: 200 mg via INTRAVENOUS

## 2020-05-25 NOTE — Anesthesia Postprocedure Evaluation (Signed)
Anesthesia Post Note  Patient: JAMYLA ARD  Procedure(s) Performed: VIDEO BRONCHOSCOPY WITH ENDOBRONCHIAL ULTRASOUND (N/A ) VIDEO BRONCHOSCOPY WITH ENDOBRONCHIAL NAVIGATION (N/A ) BRONCHIAL BRUSHINGS BRONCHIAL BIOPSIES BRONCHIAL NEEDLE ASPIRATION BIOPSIES FINE NEEDLE ASPIRATION (FNA) LINEAR     Patient location during evaluation: PACU Anesthesia Type: General Level of consciousness: awake and lethargic Pain management: pain level controlled Vital Signs Assessment: post-procedure vital signs reviewed and stable Respiratory status: spontaneous breathing, nonlabored ventilation and respiratory function stable Cardiovascular status: blood pressure returned to baseline and stable Postop Assessment: no apparent nausea or vomiting Anesthetic complications: no   No complications documented.  Last Vitals:  Vitals:   05/25/20 1106 05/25/20 1115  BP: (!) 148/78 (!) 142/73  Pulse: 68 69  Resp: 14 15  Temp: (!) 36.4 C   SpO2: 100% 100%    Last Pain:  Vitals:   05/25/20 1115  PainSc: Asleep                 Gracee Ratterree A.

## 2020-05-25 NOTE — Anesthesia Procedure Notes (Signed)
Procedure Name: Intubation Date/Time: 05/25/2020 9:22 AM Performed by: Genelle Bal, CRNA Pre-anesthesia Checklist: Patient identified, Emergency Drugs available, Suction available and Patient being monitored Patient Re-evaluated:Patient Re-evaluated prior to induction Oxygen Delivery Method: Circle system utilized Preoxygenation: Pre-oxygenation with 100% oxygen Induction Type: IV induction Ventilation: Mask ventilation without difficulty Laryngoscope Size: Miller and 2 Grade View: Grade I Tube type: Oral Tube size: 8.5 mm Number of attempts: 1 Airway Equipment and Method: Stylet and Oral airway Placement Confirmation: ETT inserted through vocal cords under direct vision,  positive ETCO2 and breath sounds checked- equal and bilateral Secured at: 21 cm Tube secured with: Tape Dental Injury: Teeth and Oropharynx as per pre-operative assessment

## 2020-05-25 NOTE — Op Note (Signed)
Video Bronchoscopy with Endobronchial Ultrasound and Electromagnetic Navigation Procedure Note  Date of Operation: 05/25/2020  Pre-op Diagnosis: Right upper lobe nodule, mediastinal adenopathy  Post-op Diagnosis: Same  Surgeon: Baltazar Apo  Assistants: None  Anesthesia: General endotracheal anesthesia  Operation: Flexible video fiberoptic bronchoscopy with endobronchial ultrasound, electromagnetic navigation and biopsies.  Estimated Blood Loss: Minimal  Complications: None apparent  Indications and History: Sara Fox is a 80 y.o. female with History of tobacco use found to have a part solid, part cystic 2.4 x 2.3 right upper lobe nodule on CT chest 05/11/2020.  Also seen was some slightly enlarged mediastinal adenopathy.  Recommendation was made to achieve a tissue diagnosis using endobronchial ultrasound and navigational bronchoscopy. The risks, benefits, complications, treatment options and expected outcomes were discussed with the patient.  The possibilities of pneumothorax, pneumonia, reaction to medication, pulmonary aspiration, perforation of a viscus, bleeding, failure to diagnose a condition and creating a complication requiring transfusion or operation were discussed with the patient who freely signed the consent.    Description of Procedure: The patient was examined in the preoperative area and history and data from the preprocedure consultation were reviewed. It was deemed appropriate to proceed.  The patient was taken to [facility] endoscopy room 2, identified as Sara Fox and the procedure verified as Flexible Video Fiberoptic Bronchoscopy.  A Time Out was held and the above information confirmed. After being taken to the operating room general anesthesia was initiated and the patient  was orally intubated. The video fiberoptic bronchoscope was introduced via the endotracheal tube and a general inspection was performed which showed normal airways throughout.  There were  no endobronchial lesions or abnormal secretions seen. The standard scope was then withdrawn and the endobronchial ultrasound was used to identify and characterize the peritracheal, hilar and bronchial lymph nodes. Inspection showed slightly enlarged 4R, 10 R, 7 nodes. Using real-time ultrasound guidance Wang needle biopsies were take from Station 4R, 10 R, 7 nodes and were sent for cytology.   Attention was then turned to the patient's peripheral lesion. Prior to the date of the procedure a high-resolution CT scan of the chest was performed. Utilizing [place] a virtual tracheobronchial tree was generated to allow the creation of distinct navigation pathways to the patient's parenchymal abnormalities. The video fiberoptic bronchoscope was re-introduced via the endotracheal tube. An extendable working channel and locator guide were introduced into the bronchoscope. The distinct navigation pathway prepared prior to this procedure were then utilized to navigate to within 0.4 cm of patient's lesion identified on CT scan. The extendable working channel was secured into place and the locator guide was withdrawn. Under fluoroscopic guidance transbronchial needle brushings, transbronchial Wang needle biopsies, and transbronchial forceps biopsies were performed to be sent for cytology and pathology. A bronchioalveolar lavage was performed in the right upper lobe and sent for cytology. At the end of the procedure a general airway inspection was performed and there was no evidence of active bleeding. The bronchoscope was removed.  The patient tolerated the procedure well. There was no significant blood loss and there were no obvious complications. A post-procedural chest x-ray is pending.   Samples: 1. Wang needle biopsies from 4R node 2. Wang needle biopsies from 7 node 3. Wang needle biopsies from 10 R node 4. Transbronchial needle brushings from right upper lobe nodule 5. Transbronchial Wang needle  biopsies from right upper lobe nodule 6. Transbronchial forceps biopsies from right upper lobe nodule 7. Bronchoalveolar lavage from right upper  lobe  Plans:  The patient will be discharged from the PACU to home when recovered from anesthesia. We will review the cytology, pathology results with the patient when they become available. Outpatient followup will be with Dr. Lamonte Sakai.    Baltazar Apo, MD, PhD 05/25/2020, 11:01 AM [facility] [phone] or if no answer [phone]

## 2020-05-25 NOTE — Discharge Instructions (Signed)
Flexible Bronchoscopy  Flexible bronchoscopy is a procedure used to examine the passageways in the lungs. During the procedure, a thin, flexible tool with a camera (bronchoscope) is passed into the mouth or nose, down through the windpipe (trachea), and into the air tubes in the lungs (bronchi). This tool allows the health care provider to look inside the lungs and to take samples for testing, if needed.      What happens during the procedure?  An IV will be inserted into one of your veins.  Samples of airway secretions may be collected for testing.  If abnormal areas are seen in your airways, samples of tissue may be removed and checked under a microscope (biopsy).  If tissue samples are needed from the outer parts of the lung, a type of X-ray (fluoroscopy) may be used to guide the bronchoscope to these areas.  If bleeding occurs, you may be given medicine to stop or decrease the bleeding. The procedure may vary among health care providers and hospitals.  What can I expect after the procedure?  Your blood pressure, heart rate, breathing rate, and blood oxygen level will be monitored until you leave the hospital or clinic.  You may have a chest X-ray to check for signs of pneumothorax.  You willnot be allowed to eat or drink anything for 2 hours after your procedure.  If a biopsy was taken, it is up to you to get the results of the test. Ask your health care provider, or the department that is doing the procedure, when your results will be ready.  You may have the following symptoms for 24-48 hours: ? A cough that is worse than it was before the procedure. ? A low-grade fever. ? A sore throat or hoarse voice. ? Some blood in the mucus from your lungs (sputum), if a biopsy was done. Follow these instructions at home: Eating and drinking  Do not eat or drink anything, including water, for 2 hours after your procedure, or until your numbing medicine has worn off. Having a numb  throat increases your risk of burning yourself or choking.  Start eating soft foods and slowly drinking liquids after your numbness is gone and your cough and gag reflexes have returned.  You may return to your normal diet the day after the procedure. Driving  If you were given a sedative during the procedure, it can affect you for several hours. Do not drive or operate machinery until your health care provider says that it is safe.  Ask your health care provider if the medicine prescribed to you requires you to avoid driving or using machinery.  Return to your normal activities as told by your health care provider. Ask your health care provider what activities are safe for you. General instructions  Take over-the-counter and prescription medicines only as told by your health care provider.  Do not use any products that contain nicotine or tobacco. These products include cigarettes, chewing tobacco, and vaping devices, such as e-cigarettes. If you need help quitting, ask your health care provider.  Keep all follow-up visits. This is important.   Get help right away if:  You have shortness of breath that gets worse.  You become light-headed or feel like you might faint.  You have chest pain.  You cough up more than a small amount of blood. These symptoms may represent a serious problem that is an emergency. Do not wait to see if the symptoms will go away. Get medical help right away.  Call your local emergency services (911 in the [place] Do not drive yourself to the hospital. Summary  Flexible bronchoscopy is a procedure that allows your health care provider to look closely inside your lungs and to take testing samples if needed.  Risks of flexible bronchoscopy include bleeding, infection, and collapsed lung (pneumothorax).  Before the procedure, you will be given a medicine to numb your mouth, nose, throat, and voice box. Then, a bronchoscope will be passed into your nose or mouth,  and into your lungs.  After the procedure, your blood pressure, heart rate, breathing rate, and blood oxygen level will be monitored until you leave the hospital or clinic. You may have a chest X-ray to check for signs of pneumothorax.  You will not be allowed to eat or drink anything for 2 hours after your procedure.   Please call our office for any questions or concerns.  [phone].  This information is not intended to replace advice given to you by your health care provider. Make sure you discuss any questions you have with your health care provider. Document Revised: 11/20/2019 Document Reviewed: 11/20/2019 Elsevier Patient Education  [place].

## 2020-05-25 NOTE — Interval H&P Note (Signed)
History and Physical Interval Note:  05/25/2020 7:29 AM  Sara Fox  has presented today for surgery, with the diagnosis of [place].  The various methods of treatment have been discussed with the patient and family. After consideration of risks, benefits and other options for treatment, the patient has consented to  Procedure(s): VIDEO BRONCHOSCOPY WITH ENDOBRONCHIAL ULTRASOUND (N/A) VIDEO BRONCHOSCOPY WITH ENDOBRONCHIAL NAVIGATION (N/A) as a surgical intervention.  The patient's history has been reviewed, patient examined, no change in status, stable for surgery.  I have reviewed the patient's chart and labs.  Questions were answered to the patient's satisfaction.     Collene Gobble

## 2020-05-25 NOTE — Transfer of Care (Signed)
Immediate Anesthesia Transfer of Care Note  Patient: Sara Fox  Procedure(s) Performed: VIDEO BRONCHOSCOPY WITH ENDOBRONCHIAL ULTRASOUND (N/A ) VIDEO BRONCHOSCOPY WITH ENDOBRONCHIAL NAVIGATION (N/A ) BRONCHIAL BRUSHINGS BRONCHIAL BIOPSIES BRONCHIAL NEEDLE ASPIRATION BIOPSIES FINE NEEDLE ASPIRATION (FNA) LINEAR  Patient Location: PACU  Anesthesia Type:General  Level of Consciousness: awake, alert  and oriented  Airway & Oxygen Therapy: Patient Spontanous Breathing and Patient connected to face mask oxygen  Post-op Assessment: Report given to RN and Post -op Vital signs reviewed and stable  Post vital signs: Reviewed and stable  Last Vitals:  Vitals Value Taken Time  BP 146/75   Temp    Pulse 72   Resp 18   SpO2 100%     Last Pain:  Vitals:   05/25/20 0653  PainSc: 0-No pain      Patients Stated Pain Goal: 3 (20/35/59 7416)  Complications: No complications documented.

## 2020-05-26 ENCOUNTER — Encounter (HOSPITAL_COMMUNITY): Payer: Self-pay | Admitting: Emergency Medicine

## 2020-05-27 LAB — CYTOLOGY - NON PAP

## 2020-05-28 ENCOUNTER — Telehealth: Payer: Self-pay | Admitting: Emergency Medicine

## 2020-05-28 ENCOUNTER — Other Ambulatory Visit: Payer: Self-pay | Admitting: Physical Medicine & Rehabilitation

## 2020-05-28 DIAGNOSIS — C3490 Malignant neoplasm of unspecified part of unspecified bronchus or lung: Secondary | ICD-10-CM

## 2020-05-28 LAB — CYTOLOGY - NON PAP

## 2020-05-28 NOTE — Telephone Encounter (Signed)
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 Should be available next week. I will go ahead and make referral to [facility].

## 2020-05-31 ENCOUNTER — Telehealth: Payer: Self-pay | Admitting: Internal Medicine

## 2020-05-31 ENCOUNTER — Encounter: Payer: Self-pay | Admitting: *Deleted

## 2020-05-31 DIAGNOSIS — R911 Solitary pulmonary nodule: Secondary | ICD-10-CM

## 2020-05-31 LAB — CYTOLOGY - NON PAP

## 2020-05-31 NOTE — Telephone Encounter (Signed)
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 Sara Fox has been scheduled to see Sara Fox on 1/25 at 215pm w/labs at 145pm. Appt date and time has been given to the pt's son. Aware to arrive 15 minutes early.

## 2020-05-31 NOTE — Progress Notes (Signed)
I received referral on Sara Fox today.  I did not see PET, MRI Brain, PFT's and pathology still pending.  I did notify new patient to call and scheduled [name] to see Dr. Julien Nordmann next week with labs.

## 2020-06-01 ENCOUNTER — Ambulatory Visit: Payer: Medicare Other

## 2020-06-01 DIAGNOSIS — I10 Essential (primary) hypertension: Secondary | ICD-10-CM

## 2020-06-01 DIAGNOSIS — N1831 Chronic kidney disease, stage 3a: Secondary | ICD-10-CM

## 2020-06-01 DIAGNOSIS — G3184 Mild cognitive impairment, so stated: Secondary | ICD-10-CM

## 2020-06-01 DIAGNOSIS — I5022 Chronic systolic (congestive) heart failure: Secondary | ICD-10-CM

## 2020-06-01 NOTE — Patient Instructions (Signed)
Goals we discussed today:  Goals Addressed            This Visit's Progress   . Work with SW to identify and manage barriers to treatment       Timeframe:  Long-Range Goal Priority:  Low Start Date:  1.18.22                           Expected End Date: 5.18.22                       Next planned outreach date: 2.18.22  Patient Goals/Self-Care Activities . Over the next 30 days, patient will: with the help of her son  - Patient will self administer medications as prescribed Patient will attend all scheduled provider appointments Patient will call provider office for new concerns or questions Contact SW as needed prior to next scheduled call

## 2020-06-01 NOTE — Chronic Care Management (AMB) (Signed)
Chronic Care Management    Social Work Note  06/01/2020 Name: Sara Fox MRN: [id number] DOB: 01-Apr-1941  Sara Fox is a 80 y.o. year old female who is a primary care patient of [name], [place]. The CCM team was consulted to assist the patient with chronic disease management and/or care coordination needs related to: [employer]  and Caregiver Stress.   Engaged with patient by telephone for follow up visit in response to provider referral for social work chronic care management and care coordination services.   Consent to Services:  The patient was given information about Chronic Care Management services, agreed to services, and gave verbal consent prior to initiation of services.  Please see initial visit note for detailed documentation.   Patient agreed to services and consent obtained.   Assessment: Review of patient past medical history, allergies, medications, and health status, including review of relevant consultants reports was performed today as part of a comprehensive evaluation and provision of chronic care management and care coordination services.     SDOH (Social Determinants of Health) assessments and interventions performed:  No  Advanced Directives Status: Not addressed in this encounter.  CCM Care Plan  Allergies  Allergen Reactions  . Buprenorphine Hcl Shortness Of Breath    Throat swelling/trouble breathing and lethargic  . Morphine And Related Shortness Of Breath    Throat swelling/trouble breathing and lethargic  . Celebrex [Celecoxib] Diarrhea and Swelling    Swelling of legs   . Vioxx [Rofecoxib] Palpitations  . Codeine Other (See Comments)    Bloated     Outpatient Encounter Medications as of 06/01/2020  Medication Sig Note  . aspirin EC 81 MG tablet Take 81 mg by mouth daily. 05/24/2020: On hold due to upcoming procedure.  . Calcium Citrate-Vitamin D (CALCIUM CITRATE+D3 PETITES PO) Take 1 tablet by mouth daily.   . cholecalciferol  (VITAMIN D) 25 MCG (1000 UNIT) tablet Take 1,000 Units by mouth daily.   . ciclopirox (PENLAC) 8 % solution Apply 1 application topically at bedtime. Apply over nail and surrounding skin. Apply daily over previous coat. After seven (7) days, file nail and continue cycle.   . donepezil (ARICEPT) 5 MG tablet TAKE 1 TABLET BY MOUTH EVERYDAY AT BEDTIME (Patient taking differently: Take 5 mg by mouth at bedtime.)   . ferrous sulfate 325 (65 FE) MG tablet Take 325 mg by mouth daily with breakfast.   . furosemide (LASIX) 20 MG tablet Take 1 tablet (20 mg total) by mouth 3 (three) times a week. Sundays, Tuesdays & Thursdays.   Marland Kitchen gabapentin (NEURONTIN) 100 MG capsule Take 1 capsule (100 mg total) by mouth 2 (two) times daily.   . Magnesium 250 MG TABS Take 250 mg by mouth every evening.   . metoprolol tartrate (LOPRESSOR) 25 MG tablet Take 1 tablet (25 mg total) by mouth 2 (two) times daily.   . Multiple Vitamin (MULTIVITAMIN WITH MINERALS) TABS tablet Take 1 tablet by mouth daily.   . nitroGLYCERIN (NITROSTAT) 0.4 MG SL tablet PLACE ONE TABLET UNDER THE TONGUE EVERY 5 MINUTES AS NEEDED FOR CHEST PAIN. (Patient taking differently: Place 0.4 mg under the tongue every 5 (five) minutes x 3 doses as needed for chest pain.)   . oxybutynin (DITROPAN) 5 MG tablet TAKE 1 TABLET BY MOUTH TWICE A DAY (Patient taking differently: Take 5 mg by mouth 2 (two) times daily.)   . pantoprazole (PROTONIX) 40 MG tablet TAKE 1 TABLET BY MOUTH TWICE A DAY (Patient taking  differently: Take 40 mg by mouth 2 (two) times daily.)   . polyethylene glycol (MIRALAX / GLYCOLAX) packet Take 17 g by mouth daily as needed for mild constipation.    . Potassium Chloride ER 20 MEQ TBCR TAKE 2 TABLETS BY MOUTH EVERY MORNING (Patient taking differently: Take 40 mEq by mouth daily.)   . rosuvastatin (CRESTOR) 40 MG tablet Take 1 tablet (40 mg total) by mouth every evening.   . SYMBICORT 160-4.5 MCG/ACT inhaler TAKE 2 PUFFS BY MOUTH TWICE A DAY  (Patient taking differently: Inhale 2 puffs into the lungs in the morning and at bedtime.)   . traMADol (ULTRAM) 50 MG tablet TAKE 1 TABLET 3 TIMES A DAY AND CAN TAKE 1 EXTRA 20 DAYS/MONTH    No facility-administered encounter medications on file as of 06/01/2020.    Patient Active Problem List   Diagnosis Date Noted  . Pulmonary nodule 1 cm or greater in diameter 05/21/2020  . Atherosclerotic heart disease of native coronary artery with other forms of angina pectoris ([place]) 04/22/2020  . Fatigue 02/18/2019  . Memory loss 02/18/2019  . Vitamin B12 deficiency 10/23/2018  . Hypotension 10/23/2018  . Stage 3 chronic kidney disease ([place]) 06/03/2018  . Chronic anemia 06/03/2018  . Hypercholesterolemia 06/03/2018  . Malnutrition of moderate degree 07/12/2017  . Hypoalbuminemia 07/11/2017  . Elevated LFTs 07/11/2017  . Closed fracture of left distal radius 05/28/2017  . Snoring 12/21/2016  . OSA and COPD overlap syndrome ([place]) 12/21/2016  . Peripheral musculoskeletal gait disorder 02/05/2015  . Lumbar post-laminectomy syndrome 02/05/2015  . CAD in native artery 10/16/2014  . NSTEMI (non-ST elevated myocardial infarction) ([place]) 09/13/2014  . Cardiomyopathy, ischemic 09/13/2014  . Chronic systolic heart failure ([place])   . Tobacco abuse 09/12/2014  . Dyslipidemia 09/12/2014  . CKD (chronic kidney disease), stage II 09/12/2014  . Normocytic anemia 09/12/2014  . Chronic diastolic heart failure, NYHA class 1 ([place]) 09/12/2014  . Postlaminectomy syndrome, cervical region 08/01/2013  . Chronic midline low back pain with left-sided sciatica 08/01/2013  . Shoulder joint contracture 08/01/2013  . HTN (hypertension) 03/03/2013  . Abnormal genetic test 03/03/2013    Conditions to be addressed/monitored: CHF, HTN, CKD Stage III and Cognitive Impairment; Memory Deficits  Care Plan : Social Work [facility] Care Plan  Updates made by [name] since 06/01/2020 12:00 AM    Problem: Barriers to  Treatment     Goal: Barriers to Treatment Identified and Managed   Note:   Current Barriers:  . Chronic disease management support and education needs related to CHF, HTN, CKD Stage III, and Cognitive Impairment   . ADL IADL limitations . Limited access to caregiver . Memory Deficits  Social Worker Clinical Goal(s):  Marland Kitchen Over the next 120 days, patient will work with SW to identify and address any acute and/or chronic care coordination needs related to the self health management of CHF, HTN, CKD Stage III, and Cognitive Impairment    CCM SW Interventions:  . Inter-disciplinary care team collaboration (see longitudinal plan of care) . Collaboration with [name], FNP regarding development and update of comprehensive plan of care as evidenced by provider attestation and co-signature . Successful outbound call placed to the patient and her son to assist with care coordination needs . Discussed the patient continues to wear personal emergency response system; patient is aware to push the button for help  . Performed chart review to note upcomming appointment on 1.25.22 with Dr. Julien Nordmann - no transportation barriers identified at this  time . Discussed long term follow up with SW while patient remains actively involved with  RN Case Manager  to address care management needs . Scheduled follow up call over the next 90 days Patient Goals/Self-Care Activities . Over the next 30 days, patient will: with the help of her son  - Patient will self administer medications as prescribed Patient will attend all scheduled provider appointments Patient will call provider office for new concerns or questions Contact SW as needed prior to next scheduled call Follow Up Plan:  SW will follow up with the patient over the next month       Follow Up Plan: SW will follow up with patient by phone over the next month.      [name], BSW, CDP Social Worker, Certified Dementia Practitioner [name] [facility] [place]  Management [phone]  Total time spent performing care coordination and/or care management activities with the patient by phone or face to face = 14 minutes.

## 2020-06-03 ENCOUNTER — Other Ambulatory Visit: Payer: Self-pay | Admitting: *Deleted

## 2020-06-08 ENCOUNTER — Telehealth: Payer: Self-pay | Admitting: Internal Medicine

## 2020-06-08 ENCOUNTER — Inpatient Hospital Stay: Payer: Medicare Other | Attending: Internal Medicine | Admitting: Internal Medicine

## 2020-06-08 ENCOUNTER — Other Ambulatory Visit: Payer: Self-pay

## 2020-06-08 ENCOUNTER — Inpatient Hospital Stay: Payer: Medicare Other

## 2020-06-08 DIAGNOSIS — I130 Hypertensive heart and chronic kidney disease with heart failure and stage 1 through stage 4 chronic kidney disease, or unspecified chronic kidney disease: Secondary | ICD-10-CM | POA: Diagnosis not present

## 2020-06-08 DIAGNOSIS — R911 Solitary pulmonary nodule: Secondary | ICD-10-CM

## 2020-06-08 DIAGNOSIS — Z800 Family history of malignant neoplasm of digestive organs: Secondary | ICD-10-CM

## 2020-06-08 DIAGNOSIS — N179 Acute kidney failure, unspecified: Secondary | ICD-10-CM

## 2020-06-08 DIAGNOSIS — J449 Chronic obstructive pulmonary disease, unspecified: Secondary | ICD-10-CM | POA: Diagnosis not present

## 2020-06-08 DIAGNOSIS — F17290 Nicotine dependence, other tobacco product, uncomplicated: Secondary | ICD-10-CM

## 2020-06-08 DIAGNOSIS — C3411 Malignant neoplasm of upper lobe, right bronchus or lung: Secondary | ICD-10-CM | POA: Diagnosis not present

## 2020-06-08 DIAGNOSIS — C3490 Malignant neoplasm of unspecified part of unspecified bronchus or lung: Secondary | ICD-10-CM

## 2020-06-08 DIAGNOSIS — C3491 Malignant neoplasm of unspecified part of right bronchus or lung: Secondary | ICD-10-CM

## 2020-06-08 DIAGNOSIS — M542 Cervicalgia: Secondary | ICD-10-CM | POA: Diagnosis not present

## 2020-06-08 DIAGNOSIS — I509 Heart failure, unspecified: Secondary | ICD-10-CM

## 2020-06-08 LAB — CBC WITH DIFFERENTIAL (CANCER CENTER ONLY)
Abs Immature Granulocytes: 0.01 10*3/uL (ref 0.00–0.07)
Basophils Absolute: 0 10*3/uL (ref 0.0–0.1)
Basophils Relative: 0 %
Eosinophils Absolute: 0.1 10*3/uL (ref 0.0–0.5)
Eosinophils Relative: 1 %
HCT: 35 % — ABNORMAL LOW (ref 36.0–46.0)
Hemoglobin: 10.6 g/dL — ABNORMAL LOW (ref 12.0–15.0)
Immature Granulocytes: 0 %
Lymphocytes Relative: 24 %
Lymphs Abs: 1.2 10*3/uL (ref 0.7–4.0)
MCH: 30.5 pg (ref 26.0–34.0)
MCHC: 30.3 g/dL (ref 30.0–36.0)
MCV: 100.6 fL — ABNORMAL HIGH (ref 80.0–100.0)
Monocytes Absolute: 0.5 10*3/uL (ref 0.1–1.0)
Monocytes Relative: 10 %
Neutro Abs: 3.2 10*3/uL (ref 1.7–7.7)
Neutrophils Relative %: 65 %
Platelet Count: 243 10*3/uL (ref 150–400)
RBC: 3.48 MIL/uL — ABNORMAL LOW (ref 3.87–5.11)
RDW: 13.2 % (ref 11.5–15.5)
WBC Count: 5 10*3/uL (ref 4.0–10.5)
nRBC: 0 % (ref 0.0–0.2)

## 2020-06-08 LAB — CMP (CANCER CENTER ONLY)
ALT: 13 U/L (ref 0–44)
AST: 20 U/L (ref 15–41)
Albumin: 3.6 g/dL (ref 3.5–5.0)
Alkaline Phosphatase: 87 U/L (ref 38–126)
Anion gap: 6 (ref 5–15)
BUN: 13 mg/dL (ref 8–23)
CO2: 26 mmol/L (ref 22–32)
Calcium: 9.6 mg/dL (ref 8.9–10.3)
Chloride: 109 mmol/L (ref 98–111)
Creatinine: 1.42 mg/dL — ABNORMAL HIGH (ref 0.44–1.00)
GFR, Estimated: 37 mL/min — ABNORMAL LOW (ref >60)
Glucose, Bld: 85 mg/dL (ref 70–99)
Potassium: 3.8 mmol/L (ref 3.5–5.1)
Sodium: 141 mmol/L (ref 135–145)
Total Bilirubin: 0.4 mg/dL (ref 0.3–1.2)
Total Protein: 7.2 g/dL (ref 6.5–8.1)

## 2020-06-08 NOTE — Telephone Encounter (Signed)
Scheduled appointment per 1/25 los. Spoke to patient who is aware of appointment date and time. Gave patient updated calendar.

## 2020-06-08 NOTE — Patient Instructions (Addendum)
Thank you for choosing [facility] to provide your oncology and hematology care.   Should you have questions after your visit to the [facility]), please contact this office at [phone] between 8:30 AM and 4:30 PM.  Voice mails left after 4:00 PM may not be returned until the following business day.  Calls received after 4:30 PM will be answered by an off-site Nurse Triage Line.    Prescription Refills:  Please have your pharmacy contact us directly for most prescription requests.  Contact the office directly for refills of narcotics (pain medications). Allow 48-72 hours for refills.  Appointments: Please contact the [facility] scheduling department [phone] for questions regarding [facility] appointment scheduling.  Contact the schedulers with any scheduling changes so that your appointment can be rescheduled in a timely manner.   Central Scheduling for [facility] [phone] - Call to schedule procedures such as PET scans, CT scans, MRI, Ultrasound, etc.  To afford each patient quality time with our providers, please arrive 30 minutes before your scheduled appointment time.  If you arrive late for your appointment, you may be asked to reschedule.  We strive to give you quality time with our providers, and arriving late affects you and other patients whose appointments are after yours. If you are a no show for multiple scheduled visits, you may be dismissed from the clinic at the providers discretion.    MyChart Results-Due to recent changes in healthcare laws, you may see the results of your imaging and laboratory studies on MyChart before your provider has had a chance to review them.  We understand that in some cases there may be results that are confusing or concerning to you. Not all laboratory results come back in the same time frame and the provider may be waiting for multiple results in order to interpret others.  Please give [place] 48 hours in order for your  provider to thoroughly review all the results before contacting the office for clarification of your results.    Resources: [place] Workers [phone] for additional information on assistance programs or assistance connecting with community support programs   [place]  [phone]: Information regarding food stamps, Medicaid, and utility assistance [employer]'s shared-ride transportation service for eligible riders who have a disability that prevents them from riding the fixed route bus.   [place] ([phone] Helps people with Medicare understand their rights and benefits, navigate the Medicare system, and secure the quality healthcare they deserve [employer] [phone] Assists patients locate various types of support and financial assistance Cancer Care: 1-800-813-HOPE 814-865-5055) Provides financial assistance, online support groups, medication/co-pay assistance.   Transportation Assistance for appointments at [facility]: [facility] [phone]  Again, thank you for choosing [facility] for your care.

## 2020-06-08 NOTE — Progress Notes (Signed)
[place] [phone]   [phone]  CONSULT NOTE  REFERRING PHYSICIAN: Dr. Baltazar Apo  REASON FOR CONSULTATION:  80 years old African-American female recently diagnosed with lung cancer.  HPI Sara Fox is a 80 y.o. female with past medical history significant for hypertension, COPD, congestive heart failure, dyslipidemia, rhabdomyolysis, chronic back pain as well as acute kidney injury and long history of smoking.  The patient was seen by her primary care provider for routine evaluation and because of her history of smoking and cough she order CT scan of the chest that was performed on May 11, 2020 and it showed a 2.4 x 2.3 cm cystic/solid nodule with 1.5 cm solid component in the right lung apex suspicious for primary bronchogenic neoplasm such as invasive adenocarcinoma.  There was additional subpleural patchy opacity in the posterior right lung apex questionable for scarring and 0.4 cm subpleural nodule in the superior segment of the left lower lobe.  The scan also showed a 0.9 cm short axis low right paratracheal lymph node. The patient was referred to Dr. Lamonte Sakai and on May 25, 2020 she underwent bronchoscopy with endobronchial ultrasound and electromagnetic navigation bronchoscopy under the care of Dr. Lamonte Sakai. The final pathology ([id number]) from the brushing of the right upper lobe showed malignant cells consistent with non-small cell carcinoma. Dr. Lamonte Sakai kindly referred the patient to me today for evaluation and recommendation regarding treatment of her condition. When seen today she is feeling fine except for right neck pain as well as shortness of breath with exertion and mild cough.  She has occasional chest pain and she is currently on nitroglycerin as needed.  She lost few pounds recently.  She has no nausea, vomiting, diarrhea or constipation.  She has no headache or visual changes. Family history significant for mother with  heart disease, diabetes mellitus and colon cancer.  She also has a brother with heart disease.  The medical history of her father is unknown. The patient is a widow she lost her husband in 2020.  She has 2 children and many grandchildren.  She was accompanied today by her son Sara Fox.  She worked in several jobs Engineer, manufacturing systems. The patient has a history of smoking 1 pack/day for around 65 years and unfortunately she continues to smoke.  She has a history of alcohol abuse but quit many years ago and no history of drug abuse. HPI  Past Medical History:  Diagnosis Date  . Acute kidney injury ([place])   . Back pain   . Cervical cancer ([place])   . CHF (congestive heart failure) ([employer])   . COPD (chronic obstructive pulmonary disease) ([place])   . Coronary artery disease    s/p DES x2 to RCA 2016  . GERD (gastroesophageal reflux disease)   . Hyperlipidemia   . Hypertension   . Mild cognitive impairment   . Myocardial infarction ([place])   . Neck pain   . Rhabdomyolysis   . Stomach problems     Past Surgical History:  Procedure Laterality Date  . ABDOMINAL HYSTERECTOMY    . BACK SURGERY  2012   lower back  . BRONCHIAL BIOPSY  05/25/2020   Procedure: BRONCHIAL BIOPSIES;  Surgeon: Collene Gobble, MD;  Location: [facility] ENDOSCOPY;  Service: Pulmonary;;  . BRONCHIAL BRUSHINGS  05/25/2020   Procedure: BRONCHIAL BRUSHINGS;  Surgeon: Collene Gobble, MD;  Location: [facility] ENDOSCOPY;  Service: Pulmonary;;  . BRONCHIAL NEEDLE ASPIRATION BIOPSY  05/25/2020   Procedure: BRONCHIAL  NEEDLE ASPIRATION BIOPSIES;  Surgeon: Collene Gobble, MD;  Location: [facility] ENDOSCOPY;  Service: Pulmonary;;  . CARDIAC CATHETERIZATION  06/1999   noncritical disease invovling PDA  . CARDIAC CATHETERIZATION N/A 09/14/2014   Procedure: Left Heart Cath and Coronary Angiography;  Surgeon: Leonie Man, MD;  Location: [name] INVASIVE CV LAB CUPID;  Service: Cardiovascular;  Laterality: N/A;  . CHOLECYSTECTOMY    . ESOPHAGOGASTRODUODENOSCOPY  (EGD) WITH PROPOFOL Left 07/19/2017   Procedure: ESOPHAGOGASTRODUODENOSCOPY (EGD) WITH PROPOFOL;  Surgeon: Ronnette Juniper, MD;  Location: [facility] ENDOSCOPY;  Service: Gastroenterology;  Laterality: Left;  . FINE NEEDLE ASPIRATION  05/25/2020   Procedure: FINE NEEDLE ASPIRATION (FNA) LINEAR;  Surgeon: Collene Gobble, MD;  Location: [facility] ENDOSCOPY;  Service: Pulmonary;;  . FRACTURE SURGERY Left 05/2017   left wrist  . HARDWARE REMOVAL Left 11/07/2017   Procedure: LEFT WRIST HARDWARE REMOVAL;  Surgeon: Charlotte Crumb, MD;  Location: [place];  Service: Orthopedics;  Laterality: Left;  . KNEE SURGERY Bilateral 2001 & 2007  . NECK SURGERY  2012   2012  . PARTIAL GASTRECTOMY  2005   subtotal  . PERCUTANEOUS CORONARY STENT INTERVENTION (PCI-S)  09/14/2014   Procedure: Percutaneous Coronary Stent Intervention (Pci-S);  Surgeon: Leonie Man, MD;  Location: [facility] INVASIVE CV LAB CUPID;  Service: Cardiovascular;;  . SHOULDER SURGERY Right   . TRANSTHORACIC ECHOCARDIOGRAM  06/02/2010   EF=>55%, normal LV systolic function; normal RV systolic function; mild mitral annular calcif; trace TR; AV mildly sclerotic  . VIDEO BRONCHOSCOPY WITH ENDOBRONCHIAL NAVIGATION N/A 05/25/2020   Procedure: VIDEO BRONCHOSCOPY WITH ENDOBRONCHIAL NAVIGATION;  Surgeon: Collene Gobble, MD;  Location: [place] ENDOSCOPY;  Service: Pulmonary;  Laterality: N/A;  . VIDEO BRONCHOSCOPY WITH ENDOBRONCHIAL ULTRASOUND N/A 05/25/2020   Procedure: VIDEO BRONCHOSCOPY WITH ENDOBRONCHIAL ULTRASOUND;  Surgeon: Collene Gobble, MD;  Location: [place] ENDOSCOPY;  Service: Pulmonary;  Laterality: N/A;  . WRIST OSTEOTOMY Left 11/07/2017   Procedure: LEFT WRIST DISTAL ULNA RESECTION WITH EXTENSOR CARPI ULNARIS STABILIZATION;  Surgeon: Charlotte Crumb, MD;  Location: [place];  Service: Orthopedics;  Laterality: Left;    Family History  Problem Relation Age of Onset  . Heart disease Mother        also HTN  . Diabetes Mother   . Colon cancer Mother   . Heart disease  Brother        deceased at 81  . Hypertension Brother   . Heart attack Brother   . Heart disease Brother   . Hypertension Brother     Social History Social History   Tobacco Use  . Smoking status: Current Every Day Smoker    Packs/day: 0.50    Years: 60.00    Pack years: 30.00  . Smokeless tobacco: Never Used  . Tobacco comment: currently smokes 1/2 ppd or less  Vaping Use  . Vaping Use: Former  Substance Use Topics  . Alcohol use: No  . Drug use: No    Allergies  Allergen Reactions  . Buprenorphine Hcl Shortness Of Breath    Throat swelling/trouble breathing and lethargic  . Morphine And Related Shortness Of Breath    Throat swelling/trouble breathing and lethargic  . Celebrex [Celecoxib] Diarrhea and Swelling    Swelling of legs   . Vioxx [Rofecoxib] Palpitations  . Codeine Other (See Comments)    Bloated     Current Outpatient Medications  Medication Sig Dispense Refill  . aspirin EC 81 MG tablet Take 81 mg by mouth daily.    . Calcium Citrate-Vitamin  D (CALCIUM CITRATE+D3 PETITES PO) Take 1 tablet by mouth daily.    . cholecalciferol (VITAMIN D) 25 MCG (1000 UNIT) tablet Take 1,000 Units by mouth daily.    . ciclopirox (PENLAC) 8 % solution Apply 1 application topically at bedtime. Apply over nail and surrounding skin. Apply daily over previous coat. After seven (7) days, file nail and continue cycle.    . donepezil (ARICEPT) 5 MG tablet TAKE 1 TABLET BY MOUTH EVERYDAY AT BEDTIME (Patient taking differently: Take 5 mg by mouth at bedtime.) 90 tablet 1  . ferrous sulfate 325 (65 FE) MG tablet Take 325 mg by mouth daily with breakfast.    . furosemide (LASIX) 20 MG tablet Take 1 tablet (20 mg total) by mouth 3 (three) times a week. Sundays, Tuesdays & Thursdays.    Marland Kitchen gabapentin (NEURONTIN) 100 MG capsule Take 1 capsule (100 mg total) by mouth 2 (two) times daily.    . Magnesium 250 MG TABS Take 250 mg by mouth every evening.    . metoprolol tartrate (LOPRESSOR) 25  MG tablet Take 1 tablet (25 mg total) by mouth 2 (two) times daily. 90 tablet 3  . Multiple Vitamin (MULTIVITAMIN WITH MINERALS) TABS tablet Take 1 tablet by mouth daily.    . nitroGLYCERIN (NITROSTAT) 0.4 MG SL tablet PLACE ONE TABLET UNDER THE TONGUE EVERY 5 MINUTES AS NEEDED FOR CHEST PAIN. (Patient taking differently: Place 0.4 mg under the tongue every 5 (five) minutes x 3 doses as needed for chest pain.) 50 tablet 0  . oxybutynin (DITROPAN) 5 MG tablet TAKE 1 TABLET BY MOUTH TWICE A DAY (Patient taking differently: Take 5 mg by mouth 2 (two) times daily.) 180 tablet 1  . pantoprazole (PROTONIX) 40 MG tablet TAKE 1 TABLET BY MOUTH TWICE A DAY (Patient taking differently: Take 40 mg by mouth 2 (two) times daily.) 180 tablet 1  . polyethylene glycol (MIRALAX / GLYCOLAX) packet Take 17 g by mouth daily as needed for mild constipation.     . Potassium Chloride ER 20 MEQ TBCR TAKE 2 TABLETS BY MOUTH EVERY MORNING (Patient taking differently: Take 40 mEq by mouth daily.) 180 tablet 3  . rosuvastatin (CRESTOR) 40 MG tablet Take 1 tablet (40 mg total) by mouth every evening.    . SYMBICORT 160-4.5 MCG/ACT inhaler TAKE 2 PUFFS BY MOUTH TWICE A DAY (Patient taking differently: Inhale 2 puffs into the lungs in the morning and at bedtime.) 30.6 each 1  . traMADol (ULTRAM) 50 MG tablet TAKE 1 TABLET 3 TIMES A DAY AND CAN TAKE 1 EXTRA 20 DAYS/MONTH 110 tablet 2   No current facility-administered medications for this visit.    Review of Systems  Constitutional: positive for fatigue and weight loss Eyes: negative Ears, nose, mouth, throat, and face: negative Respiratory: positive for cough, dyspnea on exertion and pleurisy/chest pain Cardiovascular: negative Gastrointestinal: negative Genitourinary:negative Integument/breast: negative Hematologic/lymphatic: negative Musculoskeletal:positive for arthralgias Neurological: negative Behavioral/Psych: negative Endocrine: negative Allergic/Immunologic:  negative  Physical Exam  RCV:ELFYB, healthy, no distress, well nourished and well developed SKIN: skin color, texture, turgor are normal, no rashes or significant lesions HEAD: Normocephalic, No masses, lesions, tenderness or abnormalities EYES: normal, PERRLA, Conjunctiva are pink and non-injected EARS: External ears normal, Canals clear OROPHARYNX:no exudate, no erythema and lips, buccal mucosa, and tongue normal  NECK: supple, no adenopathy, no JVD LYMPH:  no palpable lymphadenopathy, no hepatosplenomegaly BREAST:not examined LUNGS: clear to auscultation , and palpation HEART: regular rate & rhythm, no murmurs and no gallops  ABDOMEN:abdomen soft, non-tender, normal bowel sounds and no masses or organomegaly BACK: No CVA tenderness, Range of motion is normal EXTREMITIES:no joint deformities, effusion, or inflammation  NEURO: alert & oriented x 3 with fluent speech, no focal motor/sensory deficits  PERFORMANCE STATUS: ECOG 1-2  LABORATORY DATA: Lab Results  Component Value Date   WBC 5.0 06/08/2020   HGB 10.6 (L) 06/08/2020   HCT 35.0 (L) 06/08/2020   MCV 100.6 (H) 06/08/2020   PLT 243 06/08/2020      Chemistry      Component Value Date/Time   NA 141 06/08/2020 1336   NA 143 04/22/2020 0957   K 3.8 06/08/2020 1336   CL 109 06/08/2020 1336   CO2 26 06/08/2020 1336   BUN 13 06/08/2020 1336   BUN 19 04/22/2020 0957   CREATININE 1.42 (H) 06/08/2020 1336      Component Value Date/Time   CALCIUM 9.6 06/08/2020 1336   ALKPHOS 87 06/08/2020 1336   AST 20 06/08/2020 1336   ALT 13 06/08/2020 1336   BILITOT 0.4 06/08/2020 1336       RADIOGRAPHIC STUDIES: CT CHEST WO CONTRAST  Result Date: 05/12/2020 CLINICAL DATA:  Smoker, history of cervical cancer EXAM: CT CHEST WITHOUT CONTRAST TECHNIQUE: Multidetector CT imaging of the chest was performed following the standard protocol without IV contrast. COMPARISON:  Chest radiograph dated 09/12/2014 FINDINGS: Cardiovascular:  The heart is top-normal in size. No pericardial effusion. No evidence of thoracic aortic aneurysm. Atherosclerotic calcifications of the aortic arch. Three vessel coronary atherosclerosis. Mediastinum/Nodes: 9 mm short axis low right paratracheal node (series 2/image 47), within normal limits. Visualized thyroid is grossly unremarkable. Lungs/Pleura: 2.4 x 2.3 cm cystic/solid nodule with 15 mm solid component in the right lung apex (series 8/image 22), suspicious for primary bronchogenic neoplasm such as invasive adenocarcinoma. Additional subpleural patchy opacity in the posterior right lung apex (series 8/images 17, 22, 24, and 25) may reflect pleural-parenchymal scarring but is considered indeterminate. 4 mm subpleural nodule in the superior segment left lower lobe (series 8/image 26). Mild paraseptal emphysematous changes, upper lung predominant. Mild subpleural reticulation/fibrosis in the posterior right lower lobe (series 8/image 66), suggesting superimposed mild chronic interstitial lung disease. No focal consolidation. No pleural effusion or pneumothorax. Upper Abdomen: Visualized upper abdomen is notable for prior cholecystectomy and vascular calcifications. Surgical clips at the GE junction. Musculoskeletal: Degenerative changes of the visualized thoracolumbar spine. Moderate anterior compression fracture deformity at T9, age indeterminate. Right shoulder arthroplasty, incompletely visualized. Suspected cervical spine fixation hardware, incompletely visualized. IMPRESSION: 2.3 cm cystic/solid nodule with 15 mm solid component in the right lung apex, suspicious for primary bronchogenic neoplasm such as invasive adenocarcinoma. PET-CT is suggested for further evaluation. 9 mm short axis low right paratracheal node, within the upper limits of normal. Aortic Atherosclerosis (ICD10-I70.0) and Emphysema (ICD10-J43.9). These results will be called to the ordering clinician or representative by the Radiologist  Assistant, and communication documented in the PACS or [employer]. Electronically Signed   By: Julian Hy M.D.   On: 05/12/2020 08:43   DG Chest Port 1 View  Result Date: 05/25/2020 CLINICAL DATA:  Status post bronchoscopy EXAM: PORTABLE CHEST 1 VIEW COMPARISON:  September 12 2014 chest radiograph; chest CT May 11, 2020 FINDINGS: No pneumothorax. The known irregular mass in the right apex region is much better seen on CT than by radiography. There is ill-defined opacity in this area currently by radiography. There is no frank edema or consolidation. There is an apparent skin fold on  the left. Heart is upper normal in size with pulmonary vascularity normal. No adenopathy evident. There is arthropathy in the left shoulder. Total shoulder replacement on the right noted. There is also postoperative change in the lower cervical spine. There is aortic atherosclerosis. IMPRESSION: No pneumothorax.  Skin fold on the left. The irregular opacity in the right apex is much better seen on CT compared to current examination. Vague increased opacity noted in the right apex. No edema or consolidation. Heart upper normal in size. No appreciable adenopathy. Aortic Atherosclerosis (ICD10-I70.0). Electronically Signed   By: Lowella Grip III M.D.   On: 05/25/2020 11:40   DG C-ARM BRONCHOSCOPY  Result Date: 05/25/2020 C-ARM BRONCHOSCOPY: Fluoroscopy was utilized by the requesting physician.  No radiographic interpretation.    ASSESSMENT: This is a very pleasant 80 years old African-American female recently diagnosed with stage IA/IIA (T1c, N0/N2, M0) non-small cell lung cancer with nonspecific subtype diagnosed in November 2021 and presenting with cystic/solid right upper lobe lung mass with a suspicious small lower right paratracheal lymph node, pending further staging work-up.   PLAN: I had a lengthy discussion with the patient and her son today about her current condition and further investigation to  complete the staging work-up as well as potential treatment options. I personally and independently reviewed the scan images and discussed the results with the patient and her son. I recommended for the patient to have a PET scan as well as MRI of the brain to complete the staging work-up. If the patient has no evidence for metastatic disease in the mediastinal lymph nodes or and no other finding except for the right upper lobe lung mass, she could be considered for curative radiotherapy.  She is not a great surgical candidate for resection. If the patient has any evidence for metastatic disease to the lymph nodes or other areas, I will discuss with the patient and her son other treatment options including a course of concurrent chemoradiation or systemic therapy if needed. The patient and her son agreed to the current plan. I strongly encouraged her to quit smoking. The patient was advised to call immediately if she has any other concerning symptoms in the interval.  The patient voices understanding of current disease status and treatment options and is in agreement with the current care plan.  All questions were answered. The patient knows to call the clinic with any problems, questions or concerns. We can certainly see the patient much sooner if necessary.  Thank you so much for allowing me to participate in the care of Sara Fox. I will continue to follow up the patient with you and assist in her care.  The total time spent in the appointment was 70 minutes.  Disclaimer: This note was dictated with voice recognition software. Similar sounding words can inadvertently be transcribed and may not be corrected upon review.   Eilleen Kempf June 08, 2020, 2:43 PM

## 2020-06-15 ENCOUNTER — Ambulatory Visit (INDEPENDENT_AMBULATORY_CARE_PROVIDER_SITE_OTHER): Payer: Medicare Other

## 2020-06-15 ENCOUNTER — Encounter: Payer: Self-pay | Admitting: *Deleted

## 2020-06-15 ENCOUNTER — Telehealth: Payer: Medicare Other

## 2020-06-15 DIAGNOSIS — G3184 Mild cognitive impairment, so stated: Secondary | ICD-10-CM

## 2020-06-15 DIAGNOSIS — R911 Solitary pulmonary nodule: Secondary | ICD-10-CM

## 2020-06-15 DIAGNOSIS — I5022 Chronic systolic (congestive) heart failure: Secondary | ICD-10-CM

## 2020-06-15 DIAGNOSIS — I10 Essential (primary) hypertension: Secondary | ICD-10-CM

## 2020-06-15 DIAGNOSIS — IMO0001 Reserved for inherently not codable concepts without codable children: Secondary | ICD-10-CM

## 2020-06-15 DIAGNOSIS — N1831 Chronic kidney disease, stage 3a: Secondary | ICD-10-CM

## 2020-06-15 DIAGNOSIS — I25118 Atherosclerotic heart disease of native coronary artery with other forms of angina pectoris: Secondary | ICD-10-CM

## 2020-06-15 NOTE — Progress Notes (Signed)
I followed up on Ms. Smiths schedule.  She is set up for her PET/MRI brain and follow up with Dr. Julien Nordmann next week.

## 2020-06-15 NOTE — Progress Notes (Signed)
Oncology Nurse Navigator Documentation  Oncology Nurse Navigator Flowsheets 06/15/2020  Abnormal Finding Date 05/12/2020  Confirmed Diagnosis Date 05/25/2020  Diagnosis Status Confirmed Diagnosis Complete  Navigator Follow Up Date: 06/22/2020  Navigator Follow Up Reason: Scan Review  Navigator Location [facility]  Navigator Encounter Type Other:  Patient Visit Type Other  Treatment Phase Other  Barriers/Navigation Needs Coordination of Care  Interventions Coordination of Care  Acuity Level 2-Minimal Needs (1-2 Barriers Identified)  Coordination of Care Other  Time Spent with Patient 15

## 2020-06-16 ENCOUNTER — Other Ambulatory Visit: Payer: Self-pay | Admitting: Internal Medicine

## 2020-06-18 NOTE — Chronic Care Management (AMB) (Signed)
Chronic Care Management   CCM RN Visit Note  06/18/2020 Name: Sara Fox MRN: [id number] DOB: 1940-10-14  Subjective: Sara Fox is a 80 y.o. year old female who is a primary care patient of [name], [place]. The care management team was consulted for assistance with disease management and care coordination needs.    Engaged with patient by telephone for follow up visit in response to provider referral for case management and/or care coordination services.   Consent to Services:  The patient was given information about Chronic Care Management services, agreed to services, and gave verbal consent prior to initiation of services.  Please see initial visit note for detailed documentation.   Patient agreed to services and verbal consent obtained.   Assessment: Review of patient past medical history, allergies, medications, health status, including review of consultants reports, laboratory and other test data, was performed as part of comprehensive evaluation and provision of chronic care management services.   SDOH (Social Determinants of Health) assessments and interventions performed:  No   CCM Care Plan  Allergies  Allergen Reactions  . Buprenorphine Hcl Shortness Of Breath    Throat swelling/trouble breathing and lethargic  . Morphine And Related Shortness Of Breath    Throat swelling/trouble breathing and lethargic  . Celebrex [Celecoxib] Diarrhea and Swelling    Swelling of legs   . Vioxx [Rofecoxib] Palpitations  . Codeine Other (See Comments)    Bloated     Outpatient Encounter Medications as of 06/15/2020  Medication Sig Note  . aspirin EC 81 MG tablet Take 81 mg by mouth daily. 05/24/2020: On hold due to upcoming procedure.  . Calcium Citrate-Vitamin D (CALCIUM CITRATE+D3 PETITES PO) Take 1 tablet by mouth daily.   . cholecalciferol (VITAMIN D) 25 MCG (1000 UNIT) tablet Take 1,000 Units by mouth daily.   . ciclopirox (PENLAC) 8 % solution Apply 1 application  topically at bedtime. Apply over nail and surrounding skin. Apply daily over previous coat. After seven (7) days, file nail and continue cycle.   . donepezil (ARICEPT) 5 MG tablet TAKE 1 TABLET BY MOUTH EVERYDAY AT BEDTIME (Patient taking differently: Take 5 mg by mouth at bedtime.)   . ferrous sulfate 325 (65 FE) MG tablet Take 325 mg by mouth daily with breakfast.   . furosemide (LASIX) 20 MG tablet Take 1 tablet (20 mg total) by mouth 3 (three) times a week. Sundays, Tuesdays & Thursdays.   Marland Kitchen gabapentin (NEURONTIN) 100 MG capsule Take 1 capsule (100 mg total) by mouth 2 (two) times daily.   . Magnesium 250 MG TABS Take 250 mg by mouth every evening.   . Multiple Vitamin (MULTIVITAMIN WITH MINERALS) TABS tablet Take 1 tablet by mouth daily.   . nitroGLYCERIN (NITROSTAT) 0.4 MG SL tablet PLACE ONE TABLET UNDER THE TONGUE EVERY 5 MINUTES AS NEEDED FOR CHEST PAIN. (Patient taking differently: Place 0.4 mg under the tongue every 5 (five) minutes x 3 doses as needed for chest pain.)   . oxybutynin (DITROPAN) 5 MG tablet TAKE 1 TABLET BY MOUTH TWICE A DAY (Patient taking differently: Take 5 mg by mouth 2 (two) times daily.)   . pantoprazole (PROTONIX) 40 MG tablet TAKE 1 TABLET BY MOUTH TWICE A DAY (Patient taking differently: Take 40 mg by mouth 2 (two) times daily.)   . polyethylene glycol (MIRALAX / GLYCOLAX) packet Take 17 g by mouth daily as needed for mild constipation.    . Potassium Chloride ER 20 MEQ TBCR TAKE 2 TABLETS  BY MOUTH EVERY MORNING (Patient taking differently: Take 40 mEq by mouth daily.)   . rosuvastatin (CRESTOR) 40 MG tablet Take 1 tablet (40 mg total) by mouth every evening.   . SYMBICORT 160-4.5 MCG/ACT inhaler TAKE 2 PUFFS BY MOUTH TWICE A DAY (Patient taking differently: Inhale 2 puffs into the lungs in the morning and at bedtime.)   . traMADol (ULTRAM) 50 MG tablet TAKE 1 TABLET 3 TIMES A DAY AND CAN TAKE 1 EXTRA 20 DAYS/MONTH   . [DISCONTINUED] metoprolol tartrate (LOPRESSOR)  25 MG tablet Take 1 tablet (25 mg total) by mouth 2 (two) times daily.    No facility-administered encounter medications on file as of 06/15/2020.    Patient Active Problem List   Diagnosis Date Noted  . Non-small cell carcinoma of right lung, stage 1 ([place]) 06/08/2020  . Pulmonary nodule 1 cm or greater in diameter 05/21/2020  . Atherosclerotic heart disease of native coronary artery with other forms of angina pectoris ([place]) 04/22/2020  . Fatigue 02/18/2019  . Memory loss 02/18/2019  . Vitamin B12 deficiency 10/23/2018  . Hypotension 10/23/2018  . Stage 3 chronic kidney disease ([place]) 06/03/2018  . Chronic anemia 06/03/2018  . Hypercholesterolemia 06/03/2018  . Malnutrition of moderate degree 07/12/2017  . Hypoalbuminemia 07/11/2017  . Elevated LFTs 07/11/2017  . Closed fracture of left distal radius 05/28/2017  . Snoring 12/21/2016  . OSA and COPD overlap syndrome ([place]) 12/21/2016  . Peripheral musculoskeletal gait disorder 02/05/2015  . Lumbar post-laminectomy syndrome 02/05/2015  . CAD in native artery 10/16/2014  . NSTEMI (non-ST elevated myocardial infarction) ([place]) 09/13/2014  . Cardiomyopathy, ischemic 09/13/2014  . Chronic systolic heart failure ([place])   . Tobacco abuse 09/12/2014  . Dyslipidemia 09/12/2014  . CKD (chronic kidney disease), stage II 09/12/2014  . Normocytic anemia 09/12/2014  . Chronic diastolic heart failure, NYHA class 1 ([place]) 09/12/2014  . Postlaminectomy syndrome, cervical region 08/01/2013  . Chronic midline low back pain with left-sided sciatica 08/01/2013  . Shoulder joint contracture 08/01/2013  . HTN (hypertension) 03/03/2013  . Abnormal genetic test 03/03/2013    Conditions to be addressed/monitored:Stage 3a chronic kidney disease, HTN, chronic systolic heart failure, Mild Cognitive Impairment, Atherosclerotic heart disease of native coronary artery with other forms of angina pectoris   Care Plan : Cancer Treatment Phase (Adult)  Updates made  by Lynne Logan, RN since 06/18/2020 12:00 AM    Problem: Psychosocial Response to Cancer Diagnosis   Priority: High    Long-Range Goal: Optimal Coping   Start Date: 06/15/2020  Expected End Date: 12/13/2020  This Visit's Progress: On track  Priority: High  Note:   Current Barriers:   Ineffective Self Health Maintenance  Lacks social connections  Cognitive deficits   Currently UNABLE TO independently self manage needs related to chronic health conditions.   Knowledge Deficits related to short term plan for care coordination needs and long term plans for chronic disease management needs Clinical Goal(s):  Marland Kitchen Collaboration with [name], FNP regarding development and update of comprehensive plan of care as evidenced by provider attestation and co-signature . Inter-disciplinary care team collaboration (see longitudinal plan of care)  Over the next 180 days, patient will work with care management team to address care coordination and chronic disease management needs related to Disease Management  Educational Needs  Care Coordination  Medication Management and Education  Medication Reconciliation  Psychosocial Support  Dementia and Caregiver Support   Interventions:  06/15/20 Successful call completed with son Sara Fox  Evaluation  of current treatment plan related to Malignant neoplasm of unspecified part of unspecified bronchus or lung self-management and patient's adherence to plan as established by provider.  Collaboration with [name], FNP regarding development and update of comprehensive plan of care as evidenced by provider attestation       and co-signature  Inter-disciplinary care team collaboration (see longitudinal plan of care)  Assessed for the patient's and family's understanding of the disease; use open-ended questions to encourage patient and family to share what is important to them.   Expressed empathy; listen actively by encouraging the patient and  family to express feelings, concerns and fears; ask questions and encourage open communication regarding embarrassing or disturbing topics.   Assessed for factors that may impact coping or adjustment, such as mental illness, prognosis, lack of social support, disfiguring disease, financial difficulties, or no or insufficient insurance coverage.   Provided information that will decrease confusion, anxiety and fear; assist in decision-making for coping with treatment, such as potential side effects, return to school or work, and transitions of care.   Assessed for caregiver burden by seeking to understand the range of responsibilities expected and the perceived impact of diagnosis on caregiver.   Reviewed and discussed next steps for MRI/PET scans and MD f/u with Oncology   Discussed plans with patient for ongoing care management follow up and provided patient with direct contact information for care management team Patient Goals/Self Care Activities:  Over the next 180 days, patient will:  Complete scheduled MRI and PET scans as directed Keep all MD follow up appointments as scheduled Call your Oncology navigator for questions related to Cancer diagnosis and next steps Continue to work with PCP and CCM team for disease education and support and or for assistance with care coordination as needed   Follow Up Plan: Telephone follow up appointment with care management team member scheduled for: 07/26/20     Plan:Telephone follow up appointment with care management team member scheduled for:  07/26/20  Barb Merino, RN, BSN, CCM Care Management Coordinator [facility]  Direct Phone: [phone]

## 2020-06-18 NOTE — Patient Instructions (Signed)
Goals Addressed    . Understand treatment plan for new Cancer diagnosis   On track    Timeframe:  Long-Range Goal Priority:  High Start Date: 06/15/20                            Expected End Date:  12/13/20  Follow up date: 07/26/20  Over the next 180 days, patient will:  Complete scheduled MRI and PET scans as directed Keep all MD follow up appointments as scheduled Call your Oncology navigator for questions related to Cancer diagnosis and next steps Continue to work with PCP and CCM team for disease education and support and or for assistance with care coordination as needed

## 2020-06-21 ENCOUNTER — Other Ambulatory Visit: Payer: Self-pay

## 2020-06-21 ENCOUNTER — Ambulatory Visit (HOSPITAL_COMMUNITY)
Admission: RE | Admit: 2020-06-21 | Discharge: 2020-06-21 | Disposition: A | Discharge status: Home / Self Care | Payer: Medicare Other | Source: Ambulatory Visit | Attending: Internal Medicine | Admitting: Internal Medicine

## 2020-06-21 DIAGNOSIS — K4090 Unilateral inguinal hernia, without obstruction or gangrene, not specified as recurrent: Secondary | ICD-10-CM | POA: Insufficient documentation

## 2020-06-21 DIAGNOSIS — I700 Atherosclerosis of aorta: Secondary | ICD-10-CM | POA: Diagnosis not present

## 2020-06-21 DIAGNOSIS — J439 Emphysema, unspecified: Secondary | ICD-10-CM | POA: Diagnosis not present

## 2020-06-21 DIAGNOSIS — I6782 Cerebral ischemia: Secondary | ICD-10-CM | POA: Insufficient documentation

## 2020-06-21 DIAGNOSIS — C3490 Malignant neoplasm of unspecified part of unspecified bronchus or lung: Secondary | ICD-10-CM

## 2020-06-21 DIAGNOSIS — I2510 Atherosclerotic heart disease of native coronary artery without angina pectoris: Secondary | ICD-10-CM | POA: Diagnosis not present

## 2020-06-21 LAB — GLUCOSE, CAPILLARY: Glucose-Capillary: 85 mg/dL (ref 70–99)

## 2020-06-21 MED ORDER — GADOBUTROL 1 MMOL/ML IV SOLN
5.0000 mL | Freq: Once | INTRAVENOUS | Status: AC | PRN
  Administered 2020-06-21: 5 mL via INTRAVENOUS

## 2020-06-21 MED ORDER — FLUDEOXYGLUCOSE F - 18 (FDG) INJECTION
6.2700 | Freq: Once | INTRAVENOUS | Status: AC | PRN
  Administered 2020-06-21: 6.27 via INTRAVENOUS

## 2020-06-21 MED ORDER — FLUDEOXYGLUCOSE F - 18 (FDG) INJECTION: 6.2700 | Freq: Once | INTRAVENOUS | Status: DC | PRN

## 2020-06-23 ENCOUNTER — Telehealth: Payer: Self-pay | Admitting: Internal Medicine

## 2020-06-23 ENCOUNTER — Inpatient Hospital Stay: Payer: Medicare Other | Admitting: Internal Medicine

## 2020-06-23 NOTE — Telephone Encounter (Signed)
Reschedule appt per 2/9 sch msg - pt son is aware of new appt.

## 2020-06-29 ENCOUNTER — Other Ambulatory Visit: Payer: Self-pay

## 2020-06-29 ENCOUNTER — Encounter: Payer: Self-pay | Admitting: *Deleted

## 2020-06-29 ENCOUNTER — Telehealth: Payer: Self-pay | Admitting: Internal Medicine

## 2020-06-29 ENCOUNTER — Inpatient Hospital Stay: Payer: Medicare Other | Attending: Internal Medicine | Admitting: Internal Medicine

## 2020-06-29 VITALS — BP 123/67 | HR 71 | Temp 97.9°F | Resp 14 | Ht 63.0 in | Wt 123.0 lb

## 2020-06-29 DIAGNOSIS — C3490 Malignant neoplasm of unspecified part of unspecified bronchus or lung: Secondary | ICD-10-CM | POA: Diagnosis not present

## 2020-06-29 DIAGNOSIS — C3491 Malignant neoplasm of unspecified part of right bronchus or lung: Secondary | ICD-10-CM

## 2020-06-29 DIAGNOSIS — C3411 Malignant neoplasm of upper lobe, right bronchus or lung: Secondary | ICD-10-CM | POA: Insufficient documentation

## 2020-06-29 DIAGNOSIS — M25551 Pain in right hip: Secondary | ICD-10-CM | POA: Diagnosis not present

## 2020-06-29 NOTE — Progress Notes (Signed)
I spoke with Sara Fox today.  Her work up is completed and is referred to thoracic surgery.  Referral completed and I will update their team.

## 2020-06-29 NOTE — Telephone Encounter (Signed)
Scheduled appointments per 2/15 los. Spoke to patient who is aware of appointments date and times.

## 2020-06-29 NOTE — Progress Notes (Signed)
[facility] [phone]   [phone]  OFFICE PROGRESS NOTE  Sara Fox, [address] [place] [address] [place] Sara Fox [id number]  DIAGNOSIS: Stage IIA (T2b, N0, M0) non-small cell lung cancer with nonspecific subtype diagnosed in November 2021 and presenting with cystic/solid right upper lobe lung mass with a suspicious small lower right paratracheal lymph node  PRIOR THERAPY: None  CURRENT THERAPY: None  INTERVAL HISTORY: Sara Fox 80 y.o. female returns to the clinic today for follow-up visit accompanied by her son.  The patient is feeling fine today with no concerning complaints except for intermittent pain in the right hip.  She denied having any current chest pain, shortness of breath, cough or hemoptysis.  She denied having any fever or chills.  She has no nausea, vomiting, diarrhea or constipation.  She has no headache or visual changes.  She had several studies performed recently including a PET scan as well as MRI of the brain and she is here for evaluation and discussion of her scan results and treatment options.  MEDICAL HISTORY: Past Medical History:  Diagnosis Date  . Acute kidney injury ([place])   . Back pain   . Cervical cancer ([place])   . CHF (congestive heart failure) ([place])   . COPD (chronic obstructive pulmonary disease) ([place])   . Coronary artery disease    s/p DES x2 to RCA 2016  . GERD (gastroesophageal reflux disease)   . Hyperlipidemia   . Hypertension   . Mild cognitive impairment   . Myocardial infarction ([place])   . Neck pain   . Rhabdomyolysis   . Stomach problems     ALLERGIES:  is allergic to buprenorphine hcl, morphine and related, celebrex [celecoxib], vioxx [rofecoxib], and codeine.  MEDICATIONS:  Current Outpatient Medications  Medication Sig Dispense Refill  . aspirin EC 81 MG tablet Take 81 mg by mouth daily.    . Calcium Citrate-Vitamin D (CALCIUM CITRATE+D3 PETITES PO) Take 1 tablet by mouth daily.     . cholecalciferol (VITAMIN D) 25 MCG (1000 UNIT) tablet Take 1,000 Units by mouth daily.    . ciclopirox (PENLAC) 8 % solution Apply 1 application topically at bedtime. Apply over nail and surrounding skin. Apply daily over previous coat. After seven (7) days, file nail and continue cycle.    . donepezil (ARICEPT) 5 MG tablet TAKE 1 TABLET BY MOUTH EVERYDAY AT BEDTIME (Patient taking differently: Take 5 mg by mouth at bedtime.) 90 tablet 1  . ferrous sulfate 325 (65 FE) MG tablet Take 325 mg by mouth daily with breakfast.    . furosemide (LASIX) 20 MG tablet Take 1 tablet (20 mg total) by mouth 3 (three) times a week. Sundays, Tuesdays & Thursdays.    Marland Kitchen gabapentin (NEURONTIN) 100 MG capsule Take 1 capsule (100 mg total) by mouth 2 (two) times daily.    . Magnesium 250 MG TABS Take 250 mg by mouth every evening.    . metoprolol tartrate (LOPRESSOR) 25 MG tablet TAKE 1 TABLET BY MOUTH TWICE A DAY 180 tablet 1  . Multiple Vitamin (MULTIVITAMIN WITH MINERALS) TABS tablet Take 1 tablet by mouth daily.    . nitroGLYCERIN (NITROSTAT) 0.4 MG SL tablet PLACE ONE TABLET UNDER THE TONGUE EVERY 5 MINUTES AS NEEDED FOR CHEST PAIN. (Patient taking differently: Place 0.4 mg under the tongue every 5 (five) minutes x 3 doses as needed for chest pain.) 50 tablet 0  . oxybutynin (DITROPAN) 5 MG tablet TAKE  1 TABLET BY MOUTH TWICE A DAY (Patient taking differently: Take 5 mg by mouth 2 (two) times daily.) 180 tablet 1  . pantoprazole (PROTONIX) 40 MG tablet TAKE 1 TABLET BY MOUTH TWICE A DAY (Patient taking differently: Take 40 mg by mouth 2 (two) times daily.) 180 tablet 1  . polyethylene glycol (MIRALAX / GLYCOLAX) packet Take 17 g by mouth daily as needed for mild constipation.     . Potassium Chloride ER 20 MEQ TBCR TAKE 2 TABLETS BY MOUTH EVERY MORNING (Patient taking differently: Take 40 mEq by mouth daily.) 180 tablet 3  . rosuvastatin (CRESTOR) 40 MG tablet Take 1 tablet (40 mg total) by mouth every evening.     . SYMBICORT 160-4.5 MCG/ACT inhaler TAKE 2 PUFFS BY MOUTH TWICE A DAY (Patient taking differently: Inhale 2 puffs into the lungs in the morning and at bedtime.) 30.6 each 1  . traMADol (ULTRAM) 50 MG tablet TAKE 1 TABLET 3 TIMES A DAY AND CAN TAKE 1 EXTRA 20 DAYS/MONTH 110 tablet 2   No current facility-administered medications for this visit.    SURGICAL HISTORY:  Past Surgical History:  Procedure Laterality Date  . ABDOMINAL HYSTERECTOMY    . BACK SURGERY  2012   lower back  . BRONCHIAL BIOPSY  05/25/2020   Procedure: BRONCHIAL BIOPSIES;  Surgeon: Collene Gobble, MD;  Location: [facility] ENDOSCOPY;  Service: Pulmonary;;  . BRONCHIAL BRUSHINGS  05/25/2020   Procedure: BRONCHIAL BRUSHINGS;  Surgeon: Collene Gobble, MD;  Location: [facility] ENDOSCOPY;  Service: Pulmonary;;  . BRONCHIAL NEEDLE ASPIRATION BIOPSY  05/25/2020   Procedure: BRONCHIAL NEEDLE ASPIRATION BIOPSIES;  Surgeon: Collene Gobble, MD;  Location: [facility] ENDOSCOPY;  Service: Pulmonary;;  . CARDIAC CATHETERIZATION  06/1999   noncritical disease invovling PDA  . CARDIAC CATHETERIZATION N/A 09/14/2014   Procedure: Left Heart Cath and Coronary Angiography;  Surgeon: Leonie Man, MD;  Location: [facility] INVASIVE CV LAB CUPID;  Service: Cardiovascular;  Laterality: N/A;  . CHOLECYSTECTOMY    . ESOPHAGOGASTRODUODENOSCOPY (EGD) WITH PROPOFOL Left 07/19/2017   Procedure: ESOPHAGOGASTRODUODENOSCOPY (EGD) WITH PROPOFOL;  Surgeon: Ronnette Juniper, MD;  Location: [facility] ENDOSCOPY;  Service: Gastroenterology;  Laterality: Left;  . FINE NEEDLE ASPIRATION  05/25/2020   Procedure: FINE NEEDLE ASPIRATION (FNA) LINEAR;  Surgeon: Collene Gobble, MD;  Location: [facility] ENDOSCOPY;  Service: Pulmonary;;  . FRACTURE SURGERY Left 05/2017   left wrist  . HARDWARE REMOVAL Left 11/07/2017   Procedure: LEFT WRIST HARDWARE REMOVAL;  Surgeon: Charlotte Crumb, MD;  Location: [place];  Service: Orthopedics;  Laterality: Left;  . KNEE SURGERY Bilateral 2001 & 2007  . NECK SURGERY  2012    2012  . PARTIAL GASTRECTOMY  2005   subtotal  . PERCUTANEOUS CORONARY STENT INTERVENTION (PCI-S)  09/14/2014   Procedure: Percutaneous Coronary Stent Intervention (Pci-S);  Surgeon: Leonie Man, MD;  Location: [facility] INVASIVE CV LAB CUPID;  Service: Cardiovascular;;  . SHOULDER SURGERY Right   . TRANSTHORACIC ECHOCARDIOGRAM  06/02/2010   EF=>55%, normal LV systolic function; normal RV systolic function; mild mitral annular calcif; trace TR; AV mildly sclerotic  . VIDEO BRONCHOSCOPY WITH ENDOBRONCHIAL NAVIGATION N/A 05/25/2020   Procedure: VIDEO BRONCHOSCOPY WITH ENDOBRONCHIAL NAVIGATION;  Surgeon: Collene Gobble, MD;  Location: [place] ENDOSCOPY;  Service: Pulmonary;  Laterality: N/A;  . VIDEO BRONCHOSCOPY WITH ENDOBRONCHIAL ULTRASOUND N/A 05/25/2020   Procedure: VIDEO BRONCHOSCOPY WITH ENDOBRONCHIAL ULTRASOUND;  Surgeon: Collene Gobble, MD;  Location: [place] ENDOSCOPY;  Service: Pulmonary;  Laterality: N/A;  . WRIST OSTEOTOMY  Left 11/07/2017   Procedure: LEFT WRIST DISTAL ULNA RESECTION WITH EXTENSOR CARPI ULNARIS STABILIZATION;  Surgeon: Charlotte Crumb, MD;  Location: [place];  Service: Orthopedics;  Laterality: Left;    REVIEW OF SYSTEMS:  Constitutional: positive for fatigue Eyes: negative Ears, nose, mouth, throat, and face: negative Respiratory: negative Cardiovascular: negative Gastrointestinal: negative Genitourinary:negative Integument/breast: negative Hematologic/lymphatic: negative Musculoskeletal:positive for arthralgias Neurological: negative Behavioral/Psych: negative Endocrine: negative Allergic/Immunologic: negative   PHYSICAL EXAMINATION: General appearance: alert, cooperative and no distress Head: Normocephalic, without obvious abnormality, atraumatic Neck: no adenopathy, no JVD, supple, symmetrical, trachea midline and thyroid not enlarged, symmetric, no tenderness/mass/nodules Lymph nodes: Cervical, supraclavicular, and axillary nodes normal. Resp: clear to auscultation  bilaterally Back: symmetric, no curvature. ROM normal. No CVA tenderness. Cardio: regular rate and rhythm, S1, S2 normal, no murmur, click, rub or gallop GI: soft, non-tender; bowel sounds normal; no masses,  no organomegaly Extremities: extremities normal, atraumatic, no cyanosis or edema Neurologic: Alert and oriented X 3, normal strength and tone. Normal symmetric reflexes. Normal coordination and gait  ECOG PERFORMANCE STATUS: 1 - Symptomatic but completely ambulatory  Blood pressure 123/67, pulse 71, temperature 97.9 F (36.6 C), temperature source Tympanic, resp. rate 14, height 5\' 3"  (1.6 m), weight 123 lb (55.8 kg), SpO2 98 %.  LABORATORY DATA: Lab Results  Component Value Date   WBC 5.0 06/08/2020   HGB 10.6 (L) 06/08/2020   HCT 35.0 (L) 06/08/2020   MCV 100.6 (H) 06/08/2020   PLT 243 06/08/2020      Chemistry      Component Value Date/Time   NA 141 06/08/2020 1336   NA 143 04/22/2020 0957   K 3.8 06/08/2020 1336   CL 109 06/08/2020 1336   CO2 26 06/08/2020 1336   BUN 13 06/08/2020 1336   BUN 19 04/22/2020 0957   CREATININE 1.42 (H) 06/08/2020 1336      Component Value Date/Time   CALCIUM 9.6 06/08/2020 1336   ALKPHOS 87 06/08/2020 1336   AST 20 06/08/2020 1336   ALT 13 06/08/2020 1336   BILITOT 0.4 06/08/2020 1336       RADIOGRAPHIC STUDIES: MR BRAIN W WO CONTRAST  Result Date: 06/21/2020 CLINICAL DATA:  Non-small-cell lung cancer, staging EXAM: MRI HEAD WITHOUT AND WITH CONTRAST TECHNIQUE: Multiplanar, multiecho pulse sequences of the brain and surrounding structures were obtained without and with intravenous contrast. CONTRAST:  23mL GADAVIST GADOBUTROL 1 MMOL/ML IV SOLN COMPARISON:  CT head 07/28/2016 FINDINGS: Brain: Mild cerebral atrophy, appropriate for age. Negative for hydrocephalus. Mild white matter changes with scattered small deep white matter hyperintensities bilaterally. Negative for metastatic disease. No enhancing lesions. Negative for  intracranial hemorrhage. No acute infarct identified. Vascular: Normal arterial flow voids Skull and upper cervical spine: No focal skeletal lesion. Degenerative change in the cervical spine with prior ACDF. Sinuses/Orbits: Paranasal sinuses clear.  Negative orbit Other: None IMPRESSION: Negative for metastatic disease to the brain Mild atrophy and mild chronic microvascular ischemic change in the white matter. Electronically Signed   By: Franchot Gallo M.D.   On: 06/21/2020 08:10   NM PET Image Initial (PI) Skull Base To Thigh  Result Date: 06/21/2020 CLINICAL DATA:  Initial treatment strategy for biopsy-proven non-small cell lung cancer diagnosed on 05/25/2020. EXAM: NUCLEAR MEDICINE PET SKULL BASE TO THIGH TECHNIQUE: 6.3 mCi F-18 FDG was injected intravenously. Full-ring PET imaging was performed from the skull base to thigh after the radiotracer. CT data was obtained and used for attenuation correction and anatomic localization. Fasting blood glucose: 85  mg/dl COMPARISON:  05/11/2020 chest CT. FINDINGS: Mediastinal blood pool activity: SUV max 2.5 Liver activity: SUV max NA NECK: No hypermetabolic lymph nodes in the neck. Incidental CT findings: none CHEST: Ill-defined subsolid 4.5 x 4.0 cm posterior apical right upper lobe lung mass (series 8/image 10) with 1.3 cm solid component with low level FDG uptake with max SUV 2.2. No hypermetabolic pulmonary findings. No enlarged or hypermetabolic axillary, mediastinal or hilar lymph nodes. Incidental CT findings: Coronary atherosclerosis. Atherosclerotic nonaneurysmal thoracic aorta. Mild centrilobular and paraseptal emphysema. ABDOMEN/PELVIS: No abnormal hypermetabolic activity within the liver, pancreas, adrenal glands, or spleen. No hypermetabolic lymph nodes in the abdomen or pelvis. Incidental CT findings: Cholecystectomy. Subcentimeter hypodense anterior liver lesion, too small to characterize, unchanged since 05/03/2020 CT. Bile ducts are upper normal for  the post cholecystectomy state with CBD diameter 10 mm. Atherosclerotic nonaneurysmal abdominal aorta. Postsurgical changes from partial distal gastrectomy with gastrojejunostomy. Moderate left inguinal hernia containing small bowel loops with no evidence of acute bowel complication. Small fat containing midline supraumbilical ventral abdominal hernia. Hysterectomy. SKELETON: No focal hypermetabolic activity to suggest skeletal metastasis. Incidental CT findings: Bilateral posterior spinal fusion hardware in the lumbar spine. Surgical hardware from ACDF. Right shoulder arthroplasty. IMPRESSION: 1. Low level FDG uptake (max SUV 2.2) associated with the ill-defined subsolid 4.5 cm posterior apical right upper lobe lung mass, compatible with biopsy-proven non-small cell lung malignancy. 2. No hypermetabolic thoracic lymphadenopathy or distant metastatic disease. PET-CT staging is T2b N0 M0 (stage IIA). 3. Chronic findings include: Aortic Atherosclerosis (ICD10-I70.0) and Emphysema (ICD10-J43.9). Coronary atherosclerosis. Moderate left inguinal hernia containing small bowel loops with no evidence of acute bowel complication. Electronically Signed   By: Ilona Sorrel M.D.   On: 06/21/2020 09:50    ASSESSMENT AND PLAN: This is a very pleasant 80 years old African-American female with a stage IIa (T2b, N0, M0) non-small cell lung cancer with unspecified histologic subtype diagnosed in January 2022. The patient had a PET scan as well as MRI of the brain performed recently.  The PET scan showed low-level FDG uptake associated with the ill-defined subsolid 4.5 cm posterior apical right upper lobe lung mass compatible with the non-small cell malignancy.  There was no other hypermetabolic activity in the thoracic adenopathy or distant metastatic disease.  MRI of the brain was negative for metastatic disease to the brain. I had a lengthy discussion with the patient and her son today about her current condition and treatment  options.  The patient may not be a great surgical candidate but I will refer her to cardiothoracic surgery first for evaluation and to rule out this option. If the patient is not a surgical candidate, she could be considered for curative radiotherapy and I will also make a referral to radiation oncology for consideration of curative radiation if the patient is not a surgical candidate. I will see her back for follow-up visit in 4 months for evaluation and repeat CT scan of the chest for restaging of her disease. The patient was advised to call immediately if she has any other concerning symptoms in the interval The patient voices understanding of current disease status and treatment options and is in agreement with the current care plan.  All questions were answered. The patient knows to call the clinic with any problems, questions or concerns. We can certainly see the patient much sooner if necessary.  The total time spent in the appointment was 35 minutes.  Disclaimer: This note was dictated with voice recognition software. Similar  sounding words can inadvertently be transcribed and may not be corrected upon review.

## 2020-07-02 ENCOUNTER — Ambulatory Visit: Payer: Medicare Other

## 2020-07-02 DIAGNOSIS — I5022 Chronic systolic (congestive) heart failure: Secondary | ICD-10-CM | POA: Diagnosis not present

## 2020-07-02 DIAGNOSIS — I25118 Atherosclerotic heart disease of native coronary artery with other forms of angina pectoris: Secondary | ICD-10-CM

## 2020-07-02 DIAGNOSIS — N1831 Chronic kidney disease, stage 3a: Secondary | ICD-10-CM

## 2020-07-02 DIAGNOSIS — I10 Essential (primary) hypertension: Secondary | ICD-10-CM

## 2020-07-02 NOTE — Chronic Care Management (AMB) (Signed)
Chronic Care Management    Social Work Note  07/02/2020 Name: Sara Fox MRN: [id number] DOB: 04-Jun-1940  Sara Fox is a 80 y.o. year old female who is a primary care patient of [name], [place]. The CCM team was consulted to assist the patient with chronic disease management and/or care coordination needs related to: [employer] .   Engaged with the patients son by telephone for follow up visit in response to provider referral for social work chronic care management and care coordination services.   Consent to Services:  The patient was given information about Chronic Care Management services, agreed to services, and gave verbal consent prior to initiation of services.  Please see initial visit note for detailed documentation.   Patient agreed to services and consent obtained.   Assessment: Review of patient past medical history, allergies, medications, and health status, including review of relevant consultants reports was performed today as part of a comprehensive evaluation and provision of chronic care management and care coordination services.     SDOH (Social Determinants of Health) assessments and interventions performed:    Advanced Directives Status: Not addressed in this encounter.  CCM Care Plan  Allergies  Allergen Reactions  . Buprenorphine Hcl Shortness Of Breath    Throat swelling/trouble breathing and lethargic  . Morphine And Related Shortness Of Breath    Throat swelling/trouble breathing and lethargic  . Celebrex [Celecoxib] Diarrhea and Swelling    Swelling of legs   . Vioxx [Rofecoxib] Palpitations  . Codeine Other (See Comments)    Bloated     Outpatient Encounter Medications as of 07/02/2020  Medication Sig Note  . aspirin EC 81 MG tablet Take 81 mg by mouth daily. 05/24/2020: On hold due to upcoming procedure.  . Calcium Citrate-Vitamin D (CALCIUM CITRATE+D3 PETITES PO) Take 1 tablet by mouth daily.   . cholecalciferol (VITAMIN D) 25  MCG (1000 UNIT) tablet Take 1,000 Units by mouth daily.   . ciclopirox (PENLAC) 8 % solution Apply 1 application topically at bedtime. Apply over nail and surrounding skin. Apply daily over previous coat. After seven (7) days, file nail and continue cycle.   . donepezil (ARICEPT) 5 MG tablet TAKE 1 TABLET BY MOUTH EVERYDAY AT BEDTIME (Patient taking differently: Take 5 mg by mouth at bedtime.)   . ferrous sulfate 325 (65 FE) MG tablet Take 325 mg by mouth daily with breakfast.   . furosemide (LASIX) 20 MG tablet Take 1 tablet (20 mg total) by mouth 3 (three) times a week. Sundays, Tuesdays & Thursdays.   Marland Kitchen gabapentin (NEURONTIN) 100 MG capsule Take 1 capsule (100 mg total) by mouth 2 (two) times daily.   . Magnesium 250 MG TABS Take 250 mg by mouth every evening.   . metoprolol tartrate (LOPRESSOR) 25 MG tablet TAKE 1 TABLET BY MOUTH TWICE A DAY   . Multiple Vitamin (MULTIVITAMIN WITH MINERALS) TABS tablet Take 1 tablet by mouth daily.   . nitroGLYCERIN (NITROSTAT) 0.4 MG SL tablet PLACE ONE TABLET UNDER THE TONGUE EVERY 5 MINUTES AS NEEDED FOR CHEST PAIN. (Patient taking differently: Place 0.4 mg under the tongue every 5 (five) minutes x 3 doses as needed for chest pain.)   . oxybutynin (DITROPAN) 5 MG tablet TAKE 1 TABLET BY MOUTH TWICE A DAY (Patient taking differently: Take 5 mg by mouth 2 (two) times daily.)   . pantoprazole (PROTONIX) 40 MG tablet TAKE 1 TABLET BY MOUTH TWICE A DAY (Patient taking differently: Take 40 mg by  mouth 2 (two) times daily.)   . polyethylene glycol (MIRALAX / GLYCOLAX) packet Take 17 g by mouth daily as needed for mild constipation.    . Potassium Chloride ER 20 MEQ TBCR TAKE 2 TABLETS BY MOUTH EVERY MORNING (Patient taking differently: Take 40 mEq by mouth daily.)   . rosuvastatin (CRESTOR) 40 MG tablet Take 1 tablet (40 mg total) by mouth every evening.   . SYMBICORT 160-4.5 MCG/ACT inhaler TAKE 2 PUFFS BY MOUTH TWICE A DAY (Patient taking differently: Inhale 2 puffs  into the lungs in the morning and at bedtime.)   . traMADol (ULTRAM) 50 MG tablet TAKE 1 TABLET 3 TIMES A DAY AND CAN TAKE 1 EXTRA 20 DAYS/MONTH    No facility-administered encounter medications on file as of 07/02/2020.    Patient Active Problem List   Diagnosis Date Noted  . Non-small cell carcinoma of right lung, stage 1 ([place]) 06/08/2020  . Pulmonary nodule 1 cm or greater in diameter 05/21/2020  . Atherosclerotic heart disease of native coronary artery with other forms of angina pectoris ([place]) 04/22/2020  . Fatigue 02/18/2019  . Memory loss 02/18/2019  . Vitamin B12 deficiency 10/23/2018  . Hypotension 10/23/2018  . Stage 3 chronic kidney disease ([place]) 06/03/2018  . Chronic anemia 06/03/2018  . Hypercholesterolemia 06/03/2018  . Malnutrition of moderate degree 07/12/2017  . Hypoalbuminemia 07/11/2017  . Elevated LFTs 07/11/2017  . Closed fracture of left distal radius 05/28/2017  . Snoring 12/21/2016  . OSA and COPD overlap syndrome ([place]) 12/21/2016  . Peripheral musculoskeletal gait disorder 02/05/2015  . Lumbar post-laminectomy syndrome 02/05/2015  . CAD in native artery 10/16/2014  . NSTEMI (non-ST elevated myocardial infarction) ([place]) 09/13/2014  . Cardiomyopathy, ischemic 09/13/2014  . Chronic systolic heart failure ([place])   . Tobacco abuse 09/12/2014  . Dyslipidemia 09/12/2014  . CKD (chronic kidney disease), stage II 09/12/2014  . Normocytic anemia 09/12/2014  . Chronic diastolic heart failure, NYHA class 1 ([place]) 09/12/2014  . Postlaminectomy syndrome, cervical region 08/01/2013  . Chronic midline low back pain with left-sided sciatica 08/01/2013  . Shoulder joint contracture 08/01/2013  . HTN (hypertension) 03/03/2013  . Abnormal genetic test 03/03/2013    Conditions to be addressed/monitored: CHF, HTN and CKD Stage III; care coordination  Care Plan : Social Work [place]  Updates made by [name] since 07/02/2020 12:00 AM    Problem: Barriers to  Treatment     Goal: Barriers to Treatment Identified and Managed   Start Date: 06/01/2020  Expected End Date: 09/29/2020  This Visit's Progress: On track  Priority: Low  Note:   Current Barriers:  . Chronic disease management support and education needs related to CHF, HTN, CKD Stage III, and Cognitive Impairment   . ADL IADL limitations . Limited access to caregiver . Memory Deficits  Social Worker Clinical Goal(s):  Marland Kitchen Over the next 120 days, patient will work with SW to identify and address any acute and/or chronic care coordination needs related to the self health management of CHF, HTN, CKD Stage III, and Cognitive Impairment    CCM SW Interventions:  . Inter-disciplinary care team collaboration (see longitudinal plan of care) . Collaboration with [name], FNP regarding development and update of comprehensive plan of care as evidenced by provider attestation and co-signature . Successful outbound call placed to the patient and her son to assess for care coordination needs . Discussed the patient recent cancer diagnosis . Reviewed upcoming appointments with [name] - no transportation resources needed  at this time as the family intends to transport patient . Discussed long term follow up with SW while patient remains actively involved with  RN Case Manager  to address care management needs . Scheduled follow up call over the next 30 days Patient Goals/Self-Care Activities . Over the next 30 days, patient will: with the help of her son  - Patient will self administer medications as prescribed Patient will attend all scheduled provider appointments Patient will call provider office for new concerns or questions Contact SW as needed prior to next scheduled call Follow Up Plan:  SW will follow up with the patient over the next month       Follow Up Plan: SW will follow up with patient by phone over the next month      [name], BSW, CDP Social Worker, Certified Dementia  Practitioner [facility] [phone]  Total time spent performing care coordination and/or care management activities with the patient by phone or face to face = 23 minutes.

## 2020-07-02 NOTE — Patient Instructions (Signed)
  Goals we discussed today:  Goals Addressed            This Visit's Progress   . Work with SW to identify and manage barriers to treatment       Timeframe:  Long-Range Goal Priority:  Low Start Date:  1.18.22                           Expected End Date: 5.18.22                       Next planned outreach date: 3.16.22  Patient Goals/Self-Care Activities . Over the next 30 days, patient will: with the help of her son  - Patient will self administer medications as prescribed Patient will attend all scheduled provider appointments Patient will call provider office for new concerns or questions Contact SW as needed prior to next scheduled call

## 2020-07-05 ENCOUNTER — Encounter: Payer: Self-pay | Admitting: Registered Nurse

## 2020-07-05 ENCOUNTER — Encounter: Payer: Medicare Other | Attending: Registered Nurse | Admitting: Registered Nurse

## 2020-07-05 ENCOUNTER — Other Ambulatory Visit: Payer: Self-pay

## 2020-07-05 VITALS — BP 99/64 | HR 79 | Temp 97.7°F | Ht 65.0 in | Wt 125.4 lb

## 2020-07-05 DIAGNOSIS — M542 Cervicalgia: Secondary | ICD-10-CM | POA: Diagnosis present

## 2020-07-05 DIAGNOSIS — G8929 Other chronic pain: Secondary | ICD-10-CM | POA: Insufficient documentation

## 2020-07-05 DIAGNOSIS — Z79891 Long term (current) use of opiate analgesic: Secondary | ICD-10-CM

## 2020-07-05 DIAGNOSIS — M961 Postlaminectomy syndrome, not elsewhere classified: Secondary | ICD-10-CM

## 2020-07-05 DIAGNOSIS — M545 Low back pain, unspecified: Secondary | ICD-10-CM

## 2020-07-05 DIAGNOSIS — Z5181 Encounter for therapeutic drug level monitoring: Secondary | ICD-10-CM

## 2020-07-05 DIAGNOSIS — G894 Chronic pain syndrome: Secondary | ICD-10-CM | POA: Diagnosis not present

## 2020-07-05 NOTE — Progress Notes (Signed)
Subjective:    Patient ID: Sara Fox, female    DOB: May 27, 1940, 80 y.o.   MRN: [id number]  HPI: Sara Fox is a 80 y.o. female who returns for follow up appointment for chronic pain and medication refill. She states her  pain is located in her neck and lower back pain. She rates her pain 9. Her current exercise regime is walking and performing stretching exercises.  Sara Fox son Sara Fox brought in a old Gabapentin bottle from 2020, asking for a refill. He states her PCP gave her new instructions. He was instructed to call her PCP. This provider called [facility] this medication last fill date was 04/30/2019.   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 Julien Nordmann note.   Sara Fox Morphine equivalent is 18.33  MME.  UDS ordered today.    Pain Inventory Average Pain 8 Pain Right Now 9 My pain is constant, sharp, stabbing and aching  In the last 24 hours, has pain interfered with the following? General activity 8 Relation with others 6 Enjoyment of life 8 What TIME of day is your pain at its worst? morning  and evening Sleep (in general) Poor  Pain is worse with: walking, bending, inactivity, standing and some activites Pain improves with: medication Relief from Meds: 6  Family History  Problem Relation Age of Onset  . Heart disease Mother        also HTN  . Diabetes Mother   . Colon cancer Mother   . Heart disease Brother        deceased at 45  . Hypertension Brother   . Heart attack Brother   . Heart disease Brother   . Hypertension Brother    Social History   Socioeconomic History  . Marital status: Married    Spouse name: Not on file  . Number of children: 2  . Years of education: GED  . Highest education level: Not on file  Occupational History    Employer: RETIRED  Tobacco Use  . Smoking status: Current Every Day Smoker    Packs/day: 0.50    Years: 60.00    Pack years: 30.00  .  Smokeless tobacco: Never Used  . Tobacco comment: currently smokes 1/2 ppd or less  Vaping Use  . Vaping Use: Former  Substance and Sexual Activity  . Alcohol use: No  . Drug use: No  . Sexual activity: Not Currently  Other Topics Concern  . Not on file  Social History Narrative  . Not on file   Social Determinants of Health   Financial Resource Strain: Low Risk   . Difficulty of Paying Living Expenses: Not hard at all  Food Insecurity: No Food Insecurity  . Worried About Charity fundraiser in the Last Year: Never true  . Ran Out of Food in the Last Year: Never true  Transportation Needs: No Transportation Needs  . Lack of Transportation (Medical): No  . Lack of Transportation (Non-Medical): No  Physical Activity: Inactive  . Days of Exercise per Week: 0 days  . Minutes of Exercise per Session: 0 min  Stress: No Stress Concern Present  . Feeling of Stress : Not at all  Social Connections: Not on file   Past Surgical History:  Procedure Laterality Date  . ABDOMINAL HYSTERECTOMY    . BACK SURGERY  2012   lower back  . BRONCHIAL BIOPSY  05/25/2020   Procedure:  BRONCHIAL BIOPSIES;  Surgeon: Collene Gobble, MD;  Location: [facility] ENDOSCOPY;  Service: Pulmonary;;  . BRONCHIAL BRUSHINGS  05/25/2020   Procedure: BRONCHIAL BRUSHINGS;  Surgeon: Collene Gobble, MD;  Location: [facility] ENDOSCOPY;  Service: Pulmonary;;  . BRONCHIAL NEEDLE ASPIRATION BIOPSY  05/25/2020   Procedure: BRONCHIAL NEEDLE ASPIRATION BIOPSIES;  Surgeon: Collene Gobble, MD;  Location: [facility] ENDOSCOPY;  Service: Pulmonary;;  . CARDIAC CATHETERIZATION  06/1999   noncritical disease invovling PDA  . CARDIAC CATHETERIZATION N/A 09/14/2014   Procedure: Left Heart Cath and Coronary Angiography;  Surgeon: Leonie Man, MD;  Location: [facility] INVASIVE CV LAB CUPID;  Service: Cardiovascular;  Laterality: N/A;  . CHOLECYSTECTOMY    . ESOPHAGOGASTRODUODENOSCOPY (EGD) WITH PROPOFOL Left 07/19/2017   Procedure: ESOPHAGOGASTRODUODENOSCOPY  (EGD) WITH PROPOFOL;  Surgeon: Ronnette Juniper, MD;  Location: [facility] ENDOSCOPY;  Service: Gastroenterology;  Laterality: Left;  . FINE NEEDLE ASPIRATION  05/25/2020   Procedure: FINE NEEDLE ASPIRATION (FNA) LINEAR;  Surgeon: Collene Gobble, MD;  Location: [facility] ENDOSCOPY;  Service: Pulmonary;;  . FRACTURE SURGERY Left 05/2017   left wrist  . HARDWARE REMOVAL Left 11/07/2017   Procedure: LEFT WRIST HARDWARE REMOVAL;  Surgeon: Charlotte Crumb, MD;  Location: [place];  Service: Orthopedics;  Laterality: Left;  . KNEE SURGERY Bilateral 2001 & 2007  . NECK SURGERY  2012   2012  . PARTIAL GASTRECTOMY  2005   subtotal  . PERCUTANEOUS CORONARY STENT INTERVENTION (PCI-S)  09/14/2014   Procedure: Percutaneous Coronary Stent Intervention (Pci-S);  Surgeon: Leonie Man, MD;  Location: [facility] INVASIVE CV LAB CUPID;  Service: Cardiovascular;;  . SHOULDER SURGERY Right   . TRANSTHORACIC ECHOCARDIOGRAM  06/02/2010   EF=>55%, normal LV systolic function; normal RV systolic function; mild mitral annular calcif; trace TR; AV mildly sclerotic  . VIDEO BRONCHOSCOPY WITH ENDOBRONCHIAL NAVIGATION N/A 05/25/2020   Procedure: VIDEO BRONCHOSCOPY WITH ENDOBRONCHIAL NAVIGATION;  Surgeon: Collene Gobble, MD;  Location: [place] ENDOSCOPY;  Service: Pulmonary;  Laterality: N/A;  . VIDEO BRONCHOSCOPY WITH ENDOBRONCHIAL ULTRASOUND N/A 05/25/2020   Procedure: VIDEO BRONCHOSCOPY WITH ENDOBRONCHIAL ULTRASOUND;  Surgeon: Collene Gobble, MD;  Location: [place] ENDOSCOPY;  Service: Pulmonary;  Laterality: N/A;  . WRIST OSTEOTOMY Left 11/07/2017   Procedure: LEFT WRIST DISTAL ULNA RESECTION WITH EXTENSOR CARPI ULNARIS STABILIZATION;  Surgeon: Charlotte Crumb, MD;  Location: [place];  Service: Orthopedics;  Laterality: Left;   Past Surgical History:  Procedure Laterality Date  . ABDOMINAL HYSTERECTOMY    . BACK SURGERY  2012   lower back  . BRONCHIAL BIOPSY  05/25/2020   Procedure: BRONCHIAL BIOPSIES;  Surgeon: Collene Gobble, MD;  Location: [facility]  ENDOSCOPY;  Service: Pulmonary;;  . BRONCHIAL BRUSHINGS  05/25/2020   Procedure: BRONCHIAL BRUSHINGS;  Surgeon: Collene Gobble, MD;  Location: [facility] ENDOSCOPY;  Service: Pulmonary;;  . BRONCHIAL NEEDLE ASPIRATION BIOPSY  05/25/2020   Procedure: BRONCHIAL NEEDLE ASPIRATION BIOPSIES;  Surgeon: Collene Gobble, MD;  Location: [facility] ENDOSCOPY;  Service: Pulmonary;;  . CARDIAC CATHETERIZATION  06/1999   noncritical disease invovling PDA  . CARDIAC CATHETERIZATION N/A 09/14/2014   Procedure: Left Heart Cath and Coronary Angiography;  Surgeon: Leonie Man, MD;  Location: [facility] INVASIVE CV LAB CUPID;  Service: Cardiovascular;  Laterality: N/A;  . CHOLECYSTECTOMY    . ESOPHAGOGASTRODUODENOSCOPY (EGD) WITH PROPOFOL Left 07/19/2017   Procedure: ESOPHAGOGASTRODUODENOSCOPY (EGD) WITH PROPOFOL;  Surgeon: Ronnette Juniper, MD;  Location: [facility] ENDOSCOPY;  Service: Gastroenterology;  Laterality: Left;  . FINE NEEDLE ASPIRATION  05/25/2020   Procedure: FINE  NEEDLE ASPIRATION (FNA) LINEAR;  Surgeon: Collene Gobble, MD;  Location: [facility] ENDOSCOPY;  Service: Pulmonary;;  . FRACTURE SURGERY Left 05/2017   left wrist  . HARDWARE REMOVAL Left 11/07/2017   Procedure: LEFT WRIST HARDWARE REMOVAL;  Surgeon: Charlotte Crumb, MD;  Location: [place];  Service: Orthopedics;  Laterality: Left;  . KNEE SURGERY Bilateral 2001 & 2007  . NECK SURGERY  2012   2012  . PARTIAL GASTRECTOMY  2005   subtotal  . PERCUTANEOUS CORONARY STENT INTERVENTION (PCI-S)  09/14/2014   Procedure: Percutaneous Coronary Stent Intervention (Pci-S);  Surgeon: Leonie Man, MD;  Location: [facility] INVASIVE CV LAB CUPID;  Service: Cardiovascular;;  . SHOULDER SURGERY Right   . TRANSTHORACIC ECHOCARDIOGRAM  06/02/2010   EF=>55%, normal LV systolic function; normal RV systolic function; mild mitral annular calcif; trace TR; AV mildly sclerotic  . VIDEO BRONCHOSCOPY WITH ENDOBRONCHIAL NAVIGATION N/A 05/25/2020   Procedure: VIDEO BRONCHOSCOPY WITH ENDOBRONCHIAL NAVIGATION;   Surgeon: Collene Gobble, MD;  Location: [place] ENDOSCOPY;  Service: Pulmonary;  Laterality: N/A;  . VIDEO BRONCHOSCOPY WITH ENDOBRONCHIAL ULTRASOUND N/A 05/25/2020   Procedure: VIDEO BRONCHOSCOPY WITH ENDOBRONCHIAL ULTRASOUND;  Surgeon: Collene Gobble, MD;  Location: [place] ENDOSCOPY;  Service: Pulmonary;  Laterality: N/A;  . WRIST OSTEOTOMY Left 11/07/2017   Procedure: LEFT WRIST DISTAL ULNA RESECTION WITH EXTENSOR CARPI ULNARIS STABILIZATION;  Surgeon: Charlotte Crumb, MD;  Location: [place];  Service: Orthopedics;  Laterality: Left;   Past Medical History:  Diagnosis Date  . Acute kidney injury ([place])   . Back pain   . Cervical cancer ([place])   . CHF (congestive heart failure) ([place])   . COPD (chronic obstructive pulmonary disease) ([place])   . Coronary artery disease    s/p DES x2 to RCA 2016  . GERD (gastroesophageal reflux disease)   . Hyperlipidemia   . Hypertension   . Mild cognitive impairment   . Myocardial infarction ([place])   . Neck pain   . Rhabdomyolysis   . Stomach problems    There were no vitals taken for this visit.  Opioid Risk Score:   Fall Risk Score:  `1  Depression screen PHQ 2/9  Depression screen [facility] 2/9 01/01/2020 12/11/2019 07/10/2019 06/30/2019 06/11/2019 04/24/2019 03/27/2019  Decreased Interest 0 0 0 0 0 0 0  Down, Depressed, Hopeless 0 0 0 0 0 0 0  PHQ - 2 Score 0 0 0 0 0 0 0  Altered sleeping - 0 - - - - -  Tired, decreased energy - 0 - - - - -  Change in appetite - 0 - - - - -  Feeling bad or failure about yourself  - 0 - - - - -  Trouble concentrating - 0 - - - - -  Moving slowly or fidgety/restless - 0 - - - - -  Suicidal thoughts - 0 - - - - -  PHQ-9 Score - 0 - - - - -  Difficult doing work/chores - Not difficult at all - - - - -  Some recent data might be hidden   Review of Systems  Musculoskeletal: Positive for back pain, gait problem and neck pain.  All other systems reviewed and are negative.      Objective:   Physical Exam Vitals and nursing  note reviewed.  Constitutional:      Appearance: Normal appearance.  Cardiovascular:     Rate and Rhythm: Normal rate and regular rhythm.     Pulses: Normal pulses.  Heart sounds: Normal heart sounds.  Pulmonary:     Effort: Pulmonary effort is normal.     Breath sounds: Normal breath sounds.  Musculoskeletal:     Cervical back: Normal range of motion and neck supple.     Comments: Normal Muscle Bulk and Muscle Testing Reveals:  Upper Extremities: Decreased ROM 45 Degrees and Muscle Strength 4/5  Lumbar Paraspinal Tenderness: L-4-L-5 Lower Extremities: Full ROM and Muscle Strength 5/5 Arises from Table Slowly using cane for support Narrow Based  Gait   Skin:    General: Skin is warm and dry.  Neurological:     Mental Status: She is alert and oriented to person, place, and time.  Psychiatric:        Mood and Affect: Mood normal.        Behavior: Behavior normal.           Assessment & Plan:  1.Cervical Postlaminectomy syndrome: Cervicalgia: Continue HEP as Tolerated. Continue to monitor. 07/05/2020 2.Bilateral sacroiliac pain after lumbar fusion. Chronic Lower Back Pain without Sciatica:  Continue :Tramadol 50 mg one tablet3 times daily as needed for pain, can take one extra tablet a day 20 days out of the month.  #11002/21/2022  F/U in 6 months

## 2020-07-08 ENCOUNTER — Institutional Professional Consult (permissible substitution): Payer: Medicare Other | Admitting: Thoracic Surgery (Cardiothoracic Vascular Surgery)

## 2020-07-08 ENCOUNTER — Encounter: Payer: Self-pay | Admitting: Thoracic Surgery (Cardiothoracic Vascular Surgery)

## 2020-07-08 ENCOUNTER — Other Ambulatory Visit: Payer: Self-pay

## 2020-07-08 VITALS — BP 126/58 | HR 57 | Resp 20 | Ht 65.0 in | Wt 126.0 lb

## 2020-07-08 DIAGNOSIS — R911 Solitary pulmonary nodule: Secondary | ICD-10-CM

## 2020-07-08 NOTE — Progress Notes (Signed)
PCP is [name], FNP Referring Provider is Curt Bears, MD  Chief Complaint  Patient presents with  . 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  Sara Fox is an 80 year old woman with a history of tobacco abuse, COPD, hypertension, hyperlipidemia, MI, coronary stent, CHF, rhabdomyolysis, reflux, cervical cancer, chronic back and neck pain, and mild cognitive impairment.  She has a greater than 110-pack-year history of smoking and is still smoking between a half a pack and a pack a day.  She recently saw her primary nurse practitioner Ms. Moore.  A CT of the chest was done which showed a 2.4 x 2.3 cm right upper lobe nodule with cystic and solid components.  Dr. Lamonte Sakai did a navigational bronchoscopy on 05/25/2020.  Brushings showed malignant cells.  She saw Dr. Julien Nordmann.  An MRI of the brain was negative for metastatic disease.  It showed an ill-defined 4.5 x 4 cm posterior apical right upper lobe lung mass with a 1.3 cm solid component.  There was low-level FDG uptake with an SUV of 2.2.  There was no evidence of regional or distant metastatic disease.  She did have severe atherosclerotic disease of the thoracic aorta and coronary arteries.  She lives with her son.  She says she is able to walk without stopping due to shortness of breath.  She does walk slowly with a cane.  She does get short of breath if she has to walk up stairs.  She is a poor historian.  Her granddaughter who accompanied her is helpful with obtaining history. Zubrod Score: At the time of surgery this patient's most appropriate activity status/level should be described as: []     0    Normal activity, no symptoms [x]     1    Restricted in physical strenuous activity but ambulatory, able to do out light work []     2    Ambulatory and capable of self care, unable to do work activities, up  and about >50 % of waking hours                              []     3    Only limited self care, in bed greater than 50% of waking hours []     4    Completely disabled, no self care, confined to bed or chair []     5    Moribund  Past Medical History:  Diagnosis Date  . Acute kidney injury ([place])   . Back pain   . Cervical cancer ([place])   . CHF (congestive heart failure) ([place])   . COPD (chronic obstructive pulmonary disease) ([place])   . Coronary artery disease    s/p DES x2 to RCA 2016  . GERD (gastroesophageal reflux disease)   . Hyperlipidemia   . Hypertension   . Mild cognitive impairment   . Myocardial infarction ([place])   . Neck pain   . Rhabdomyolysis   . Stomach problems     Past Surgical History:  Procedure Laterality Date  . ABDOMINAL HYSTERECTOMY    . BACK SURGERY  2012   lower back  . BRONCHIAL BIOPSY  05/25/2020   Procedure: BRONCHIAL BIOPSIES;  Surgeon: Collene Gobble, MD;  Location: [facility] ENDOSCOPY;  Service: Pulmonary;;  . BRONCHIAL BRUSHINGS  05/25/2020  Procedure: BRONCHIAL BRUSHINGS;  Surgeon: Collene Gobble, MD;  Location: [facility] ENDOSCOPY;  Service: Pulmonary;;  . BRONCHIAL NEEDLE ASPIRATION BIOPSY  05/25/2020   Procedure: BRONCHIAL NEEDLE ASPIRATION BIOPSIES;  Surgeon: Collene Gobble, MD;  Location: [facility] ENDOSCOPY;  Service: Pulmonary;;  . CARDIAC CATHETERIZATION  06/1999   noncritical disease invovling PDA  . CARDIAC CATHETERIZATION N/A 09/14/2014   Procedure: Left Heart Cath and Coronary Angiography;  Surgeon: Leonie Man, MD;  Location: [facility] INVASIVE CV LAB CUPID;  Service: Cardiovascular;  Laterality: N/A;  . CHOLECYSTECTOMY    . ESOPHAGOGASTRODUODENOSCOPY (EGD) WITH PROPOFOL Left 07/19/2017   Procedure: ESOPHAGOGASTRODUODENOSCOPY (EGD) WITH PROPOFOL;  Surgeon: Ronnette Juniper, MD;  Location: [facility] ENDOSCOPY;  Service: Gastroenterology;  Laterality: Left;  . FINE NEEDLE ASPIRATION  05/25/2020   Procedure: FINE NEEDLE ASPIRATION (FNA) LINEAR;  Surgeon: Collene Gobble, MD;   Location: [facility] ENDOSCOPY;  Service: Pulmonary;;  . FRACTURE SURGERY Left 05/2017   left wrist  . HARDWARE REMOVAL Left 11/07/2017   Procedure: LEFT WRIST HARDWARE REMOVAL;  Surgeon: Charlotte Crumb, MD;  Location: [place];  Service: Orthopedics;  Laterality: Left;  . KNEE SURGERY Bilateral 2001 & 2007  . NECK SURGERY  2012   2012  . PARTIAL GASTRECTOMY  2005   subtotal  . PERCUTANEOUS CORONARY STENT INTERVENTION (PCI-S)  09/14/2014   Procedure: Percutaneous Coronary Stent Intervention (Pci-S);  Surgeon: Leonie Man, MD;  Location: [facility] INVASIVE CV LAB CUPID;  Service: Cardiovascular;;  . SHOULDER SURGERY Right   . TRANSTHORACIC ECHOCARDIOGRAM  06/02/2010   EF=>55%, normal LV systolic function; normal RV systolic function; mild mitral annular calcif; trace TR; AV mildly sclerotic  . VIDEO BRONCHOSCOPY WITH ENDOBRONCHIAL NAVIGATION N/A 05/25/2020   Procedure: VIDEO BRONCHOSCOPY WITH ENDOBRONCHIAL NAVIGATION;  Surgeon: Collene Gobble, MD;  Location: [place] ENDOSCOPY;  Service: Pulmonary;  Laterality: N/A;  . VIDEO BRONCHOSCOPY WITH ENDOBRONCHIAL ULTRASOUND N/A 05/25/2020   Procedure: VIDEO BRONCHOSCOPY WITH ENDOBRONCHIAL ULTRASOUND;  Surgeon: Collene Gobble, MD;  Location: [place] ENDOSCOPY;  Service: Pulmonary;  Laterality: N/A;  . WRIST OSTEOTOMY Left 11/07/2017   Procedure: LEFT WRIST DISTAL ULNA RESECTION WITH EXTENSOR CARPI ULNARIS STABILIZATION;  Surgeon: Charlotte Crumb, MD;  Location: [place];  Service: Orthopedics;  Laterality: Left;    Family History  Problem Relation Age of Onset  . Heart disease Mother        also HTN  . Diabetes Mother   . Colon cancer Mother   . Heart disease Brother        deceased at 64  . Hypertension Brother   . Heart attack Brother   . Heart disease Brother   . Hypertension Brother     Social History Social History   Tobacco Use  . Smoking status: Current Every Day Smoker    Packs/day: 0.50    Years: 60.00    Pack years: 30.00  . Smokeless tobacco:  Never Used  . Tobacco comment: currently smokes 1/2 ppd or less  Vaping Use  . Vaping Use: Former  Substance Use Topics  . Alcohol use: No  . Drug use: No    Current Outpatient Medications  Medication Sig Dispense Refill  . aspirin EC 81 MG tablet Take 81 mg by mouth daily.    . Calcium Citrate-Vitamin D (CALCIUM CITRATE+D3 PETITES PO) Take 1 tablet by mouth daily.    . cholecalciferol (VITAMIN D) 25 MCG (1000 UNIT) tablet Take 1,000 Units by mouth daily.    . ciclopirox (PENLAC) 8 % solution Apply 1  application topically at bedtime. Apply over nail and surrounding skin. Apply daily over previous coat. After seven (7) days, file nail and continue cycle.    . donepezil (ARICEPT) 5 MG tablet TAKE 1 TABLET BY MOUTH EVERYDAY AT BEDTIME (Patient taking differently: Take 5 mg by mouth at bedtime.) 90 tablet 1  . ferrous sulfate 325 (65 FE) MG tablet Take 325 mg by mouth daily with breakfast.    . furosemide (LASIX) 20 MG tablet Take 1 tablet (20 mg total) by mouth 3 (three) times a week. Sundays, Tuesdays & Thursdays.    Marland Kitchen gabapentin (NEURONTIN) 100 MG capsule Take 1 capsule (100 mg total) by mouth 2 (two) times daily.    . Magnesium 250 MG TABS Take 250 mg by mouth every evening.    . metoprolol tartrate (LOPRESSOR) 25 MG tablet TAKE 1 TABLET BY MOUTH TWICE A DAY 180 tablet 1  . Multiple Vitamin (MULTIVITAMIN WITH MINERALS) TABS tablet Take 1 tablet by mouth daily.    . nitroGLYCERIN (NITROSTAT) 0.4 MG SL tablet PLACE ONE TABLET UNDER THE TONGUE EVERY 5 MINUTES AS NEEDED FOR CHEST PAIN. (Patient taking differently: Place 0.4 mg under the tongue every 5 (five) minutes x 3 doses as needed for chest pain.) 50 tablet 0  . oxybutynin (DITROPAN) 5 MG tablet TAKE 1 TABLET BY MOUTH TWICE A DAY (Patient taking differently: Take 5 mg by mouth 2 (two) times daily.) 180 tablet 1  . pantoprazole (PROTONIX) 40 MG tablet TAKE 1 TABLET BY MOUTH TWICE A DAY (Patient taking differently: Take 40 mg by mouth 2 (two)  times daily.) 180 tablet 1  . polyethylene glycol (MIRALAX / GLYCOLAX) packet Take 17 g by mouth daily as needed for mild constipation.     . Potassium Chloride ER 20 MEQ TBCR TAKE 2 TABLETS BY MOUTH EVERY MORNING (Patient taking differently: Take 40 mEq by mouth daily.) 180 tablet 3  . rosuvastatin (CRESTOR) 40 MG tablet Take 1 tablet (40 mg total) by mouth every evening.    . SYMBICORT 160-4.5 MCG/ACT inhaler TAKE 2 PUFFS BY MOUTH TWICE A DAY (Patient taking differently: Inhale 2 puffs into the lungs in the morning and at bedtime.) 30.6 each 1  . traMADol (ULTRAM) 50 MG tablet TAKE 1 TABLET 3 TIMES A DAY AND CAN TAKE 1 EXTRA 20 DAYS/MONTH 110 tablet 2   No current facility-administered medications for this visit.    Allergies  Allergen Reactions  . Buprenorphine Hcl Shortness Of Breath    Throat swelling/trouble breathing and lethargic  . Morphine And Related Shortness Of Breath    Throat swelling/trouble breathing and lethargic  . Celebrex [Celecoxib] Diarrhea and Swelling    Swelling of legs   . Vioxx [Rofecoxib] Palpitations  . Codeine Other (See Comments)    Bloated     Review of Systems  HENT: Negative for trouble swallowing and voice change.   Respiratory: Positive for shortness of breath.   Cardiovascular: Positive for leg swelling. Negative for chest pain.  Genitourinary: Negative for difficulty urinating.  Musculoskeletal: Positive for back pain, gait problem and neck pain.  Neurological:       Memory impairment  All other systems reviewed and are negative.   BP (!) 126/58 (BP Location: Right Arm, Patient Position: Sitting)   Pulse (!) 57   Resp 20   Ht 5\' 5"  (1.651 m)   Wt 126 lb (57.2 kg)   SpO2 93% Comment: RA  BMI 20.97 kg/m  Physical Exam Vitals reviewed.  Constitutional:  General: She is not in acute distress.    Comments: Elderly, frail-appearing  HENT:     Head: Normocephalic and atraumatic.  Eyes:     General: No scleral icterus.     Extraocular Movements: Extraocular movements intact.  Neck:     Vascular: No carotid bruit.  Cardiovascular:     Rate and Rhythm: Normal rate and regular rhythm.     Heart sounds: Murmur (2/6 systolic murmur) heard.    Pulmonary:     Effort: Pulmonary effort is normal. No respiratory distress.     Breath sounds: No wheezing or rales.     Comments: Diminished breath sounds bilaterally Musculoskeletal:     Cervical back: Neck supple.  Lymphadenopathy:     Cervical: No cervical adenopathy.  Skin:    General: Skin is warm and dry.  Neurological:     General: No focal deficit present.     Mental Status: She is alert and oriented to person, place, and time.     Cranial Nerves: No cranial nerve deficit.     Gait: Gait abnormal (Ambulates with cane).    Diagnostic Tests: CT CHEST WITHOUT CONTRAST  TECHNIQUE: Multidetector CT imaging of the chest was performed following the standard protocol without IV contrast.  COMPARISON:  Chest radiograph dated 09/12/2014  FINDINGS: Cardiovascular: The heart is top-normal in size. No pericardial effusion.  No evidence of thoracic aortic aneurysm. Atherosclerotic calcifications of the aortic arch.  Three vessel coronary atherosclerosis.  Mediastinum/Nodes: 9 mm short axis low right paratracheal node (series 2/image 47), within normal limits.  Visualized thyroid is grossly unremarkable.  Lungs/Pleura: 2.4 x 2.3 cm cystic/solid nodule with 15 mm solid component in the right lung apex (series 8/image 22), suspicious for primary bronchogenic neoplasm such as invasive adenocarcinoma.  Additional subpleural patchy opacity in the posterior right lung apex (series 8/images 17, 22, 24, and 25) may reflect pleural-parenchymal scarring but is considered indeterminate.  4 mm subpleural nodule in the superior segment left lower lobe (series 8/image 26).  Mild paraseptal emphysematous changes, upper lung predominant. Mild subpleural  reticulation/fibrosis in the posterior right lower lobe (series 8/image 66), suggesting superimposed mild chronic interstitial lung disease.  No focal consolidation.  No pleural effusion or pneumothorax.  Upper Abdomen: Visualized upper abdomen is notable for prior cholecystectomy and vascular calcifications. Surgical clips at the GE junction.  Musculoskeletal: Degenerative changes of the visualized thoracolumbar spine. Moderate anterior compression fracture deformity at T9, age indeterminate. Right shoulder arthroplasty, incompletely visualized. Suspected cervical spine fixation hardware, incompletely visualized.  IMPRESSION: 2.3 cm cystic/solid nodule with 15 mm solid component in the right lung apex, suspicious for primary bronchogenic neoplasm such as invasive adenocarcinoma. PET-CT is suggested for further evaluation.  9 mm short axis low right paratracheal node, within the upper limits of normal.  Aortic Atherosclerosis (ICD10-I70.0) and Emphysema (ICD10-J43.9).  These results will be called to the ordering clinician or representative by the Radiologist Assistant, and communication documented in the PACS or [employer].   Electronically Signed   By: Julian Hy M.D.   On: 05/12/2020 08:43 NUCLEAR MEDICINE PET SKULL BASE TO THIGH  TECHNIQUE: 6.3 mCi F-18 FDG was injected intravenously. Full-ring PET imaging was performed from the skull base to thigh after the radiotracer. CT data was obtained and used for attenuation correction and anatomic localization.  Fasting blood glucose: 85 mg/dl  COMPARISON:  05/11/2020 chest CT.  FINDINGS: Mediastinal blood pool activity: SUV max 2.5  Liver activity: SUV max NA  NECK: No hypermetabolic  lymph nodes in the neck.  Incidental CT findings: none  CHEST:  Ill-defined subsolid 4.5 x 4.0 cm posterior apical right upper lobe lung mass (series 8/image 10) with 1.3 cm solid component with  low level FDG uptake with max SUV 2.2. No hypermetabolic pulmonary findings. No enlarged or hypermetabolic axillary, mediastinal or hilar lymph nodes.  Incidental CT findings: Coronary atherosclerosis. Atherosclerotic nonaneurysmal thoracic aorta. Mild centrilobular and paraseptal emphysema.  ABDOMEN/PELVIS: No abnormal hypermetabolic activity within the liver, pancreas, adrenal glands, or spleen. No hypermetabolic lymph nodes in the abdomen or pelvis.  Incidental CT findings: Cholecystectomy. Subcentimeter hypodense anterior liver lesion, too small to characterize, unchanged since 05/03/2020 CT. Bile ducts are upper normal for the post cholecystectomy state with CBD diameter 10 mm. Atherosclerotic nonaneurysmal abdominal aorta. Postsurgical changes from partial distal gastrectomy with gastrojejunostomy. Moderate left inguinal hernia containing small bowel loops with no evidence of acute bowel complication. Small fat containing midline supraumbilical ventral abdominal hernia. Hysterectomy.  SKELETON: No focal hypermetabolic activity to suggest skeletal metastasis.  Incidental CT findings: Bilateral posterior spinal fusion hardware in the lumbar spine. Surgical hardware from ACDF. Right shoulder arthroplasty.  IMPRESSION: 1. Low level FDG uptake (max SUV 2.2) associated with the ill-defined subsolid 4.5 cm posterior apical right upper lobe lung mass, compatible with biopsy-proven non-small cell lung malignancy. 2. No hypermetabolic thoracic lymphadenopathy or distant metastatic disease. PET-CT staging is T2b N0 M0 (stage IIA). 3. Chronic findings include: Aortic Atherosclerosis (ICD10-I70.0) and Emphysema (ICD10-J43.9). Coronary atherosclerosis. Moderate left inguinal hernia containing small bowel loops with no evidence of acute bowel complication.   Electronically Signed   By: Ilona Sorrel M.D.   On: 06/21/2020 09:50 I personally reviewed the CT and PET/CT images.   There is a right upper lobe lesion with both cystic and solid components with low-grade FDG uptake.  Impression: Sara Fox is an 80 year old woman with a history of tobacco abuse, COPD, hypertension, hyperlipidemia, MI, coronary stent, CHF, rhabdomyolysis, reflux, cervical cancer, chronic back and neck pain, and mild cognitive impairment.  She has a greater than 110-pack-year history of smoking and is still smoking between a half a pack and a pack a day.    CT of the chest showed a mixed density cystic and solid lesion in the right upper lobe.  Navigational bronchoscopy was performed and brushings showed non-small cell carcinoma.  Is a very irregular lesion so is difficult to get a good size on it but does appear to be a T2b,N0, stage IIa lesion.  Treatment options include surgical resection and radiation with or without sensitizing chemotherapy.  She is elderly, frail, has severe thoracic aortic atherosclerosis and severe coronary atherosclerosis (albeit asymptomatic) and continues to smoke between one half and 1 pack of cigarettes daily.  In my opinion she is not a good candidate for surgical resection.  Hopefully she would be a candidate for radiation therapy.  I would defer to radiation oncology whether that would be stereotactic, hypofractionated, or conventional.   Plan: Refer to radiation oncology Follow-up with Dr. Julien Nordmann I will be happy to see Sara Fox back at anytime in the future if I can be of any assistance with her care.  I spent over 30 minutes in review of records, images, and in consultation with Sara Fox today. Melrose Nakayama, MD [facility] [phone]

## 2020-07-09 ENCOUNTER — Encounter: Payer: Self-pay | Admitting: *Deleted

## 2020-07-09 DIAGNOSIS — C3491 Malignant neoplasm of unspecified part of right bronchus or lung: Secondary | ICD-10-CM

## 2020-07-09 NOTE — Progress Notes (Signed)
Per Dr. Roxan Hockey, I completed referral to rad onc.  I will also update their team of the referral.

## 2020-07-10 NOTE — Progress Notes (Signed)
Thoracic Location of Tumor / Histology: lung cancer   Biopsies of 06/25/2020  CYTOLOGY - NON PAP  CASE: [id number]  PATIENT: Sara Fox  Non-Gynecological Cytology Report      Clinical History: None provided    FINAL MICROSCOPIC DIAGNOSIS:   D. LYMPH NODE, 4R, FINE NEEDLE ASPIRATION:  - Atypical cells present  - See comment   E. LYMPH NODE, 10R, FINE NEEDLE ASPIRATION:  - No malignant cells identified  - Lymphoid tissue present   F. LYMPH NODE, 7, FINE NEEDLE ASPIRATION:  - No malignant cells identified  - Lymphoid tissue present   COMMENT:   D. There are rare, atypical cells present, predominantly in the cell  block. By immunohistochemistry, the atypical cells are positive for  cytokeratin AE1/3 and TTF-1. P40 was attempted but the cells of  interest are not identified. Overall, the findings are concerning for  but not definitive for metastasis. Dr. Jeannie Done reviewed the case and  agrees with the above diagnosis.   SPECIMEN ADEQUACY:  D. Satisfactory for Evaluation  E. Satisfactory for Evaluation  F. Satisfactory for Evaluation   GROSS:  Received is/are (D)(1)2 Slides (1 Quick stain). (2)2 Slides (1 Quick  stain). Also received are 10cc's of peach saline solution from needle  rinses. (E)(1)2 Slides (1 Quick stain). Also received are 5cc's of light  peach saline solution from needle rinses. (F)(1)2 Slides (1 Quick  stain). (2)2 Slides (1 Quick stain). Also received are 7.5cc's of peach  saline solution from needle rinses.(TS:GW:gw)  Smears: (D)4 (E)2 (F)4  Concentration Method (Thin Prep): (D)1 (E)1 (F)1  Cell Block: (D)1 Conventional (E)1 Conventional (F)1 Conventional  Additional Studies: N/A   06/25/2020  CYTOLOGY - NON PAP  CASE: [id number]  PATIENT: Sara Fox  Non-Gynecological Cytology Report      Clinical History: None provided  Specimen Submitted: C. LUNG, RUL, LAVAGE:    FINAL MICROSCOPIC DIAGNOSIS:  - No malignant cells  identified   SPECIMEN ADEQUACY:  Satisfactory for evaluation   GROSS:  Received is/are 10cc's of light red fluid with mucoid material.(GW:gw)  Smears: 2  Concentration Method (Thin Prep): 0  Cell Block: 0  Additional Studies: N/A   Tobacco/Marijuana/Snuff/ETOH use: States that she smoke a half pack of cigarettes sometime.  Past/Anticipated interventions by cardiothoracic surgery, if any: 06/25/2020 Dr. Baltazar Apo  VIDEO BRONCHOSCOPY WITH ENDOBRONCHIAL ULTRASOUND N/A General  VIDEO BRONCHOSCOPY WITH ENDOBRONCHIAL NAVIGATION N/A General  BRONCHIAL BRUSHINGS    BRONCHIAL BIOPSIES    BRONCHIAL NEEDLE ASPIRATION BIOPSIES    FINE NEEDLE ASPIRATION         Past/Anticipated interventions by medical oncology, if any:   Signs/Symptoms  Weight changes, if any: none  Respiratory complaints, if any: With activity  Hemoptysis, if any: none  States that she has a cough  Pain issues, if any:  Pain in back of neck and really bad headaches  SAFETY ISSUES:  Prior radiation? No  Pacemaker/ICD?  No  Possible current pregnancy? No  Is the patient on methotrexae No Vitals:   07/14/20 0942  BP: 107/60  Pulse: 78  Resp: 18  SpO2: 99%  Weight: 57.2 kg    Current Complaints / other details:

## 2020-07-14 ENCOUNTER — Other Ambulatory Visit: Payer: Self-pay

## 2020-07-14 ENCOUNTER — Encounter: Payer: Self-pay | Admitting: Radiation Oncology

## 2020-07-14 ENCOUNTER — Ambulatory Visit
Admission: RE | Admit: 2020-07-14 | Discharge: 2020-07-14 | Disposition: A | Discharge status: Home / Self Care | Payer: Medicare Other | Source: Ambulatory Visit | Attending: Radiation Oncology | Admitting: Radiation Oncology

## 2020-07-14 VITALS — BP 107/60 | HR 78 | Resp 18 | Wt 126.0 lb

## 2020-07-14 DIAGNOSIS — Z800 Family history of malignant neoplasm of digestive organs: Secondary | ICD-10-CM | POA: Insufficient documentation

## 2020-07-14 DIAGNOSIS — C3491 Malignant neoplasm of unspecified part of right bronchus or lung: Secondary | ICD-10-CM

## 2020-07-14 DIAGNOSIS — I700 Atherosclerosis of aorta: Secondary | ICD-10-CM | POA: Diagnosis not present

## 2020-07-14 DIAGNOSIS — I509 Heart failure, unspecified: Secondary | ICD-10-CM | POA: Insufficient documentation

## 2020-07-14 DIAGNOSIS — I252 Old myocardial infarction: Secondary | ICD-10-CM | POA: Insufficient documentation

## 2020-07-14 DIAGNOSIS — Z8541 Personal history of malignant neoplasm of cervix uteri: Secondary | ICD-10-CM | POA: Diagnosis not present

## 2020-07-14 DIAGNOSIS — C3411 Malignant neoplasm of upper lobe, right bronchus or lung: Secondary | ICD-10-CM | POA: Diagnosis present

## 2020-07-14 DIAGNOSIS — K4090 Unilateral inguinal hernia, without obstruction or gangrene, not specified as recurrent: Secondary | ICD-10-CM | POA: Insufficient documentation

## 2020-07-14 DIAGNOSIS — Z7982 Long term (current) use of aspirin: Secondary | ICD-10-CM | POA: Insufficient documentation

## 2020-07-14 DIAGNOSIS — I2510 Atherosclerotic heart disease of native coronary artery without angina pectoris: Secondary | ICD-10-CM | POA: Diagnosis not present

## 2020-07-14 DIAGNOSIS — I110 Hypertensive heart disease with heart failure: Secondary | ICD-10-CM | POA: Insufficient documentation

## 2020-07-14 DIAGNOSIS — I6782 Cerebral ischemia: Secondary | ICD-10-CM | POA: Insufficient documentation

## 2020-07-14 DIAGNOSIS — Z79899 Other long term (current) drug therapy: Secondary | ICD-10-CM | POA: Insufficient documentation

## 2020-07-14 DIAGNOSIS — J449 Chronic obstructive pulmonary disease, unspecified: Secondary | ICD-10-CM | POA: Diagnosis not present

## 2020-07-14 DIAGNOSIS — F17210 Nicotine dependence, cigarettes, uncomplicated: Secondary | ICD-10-CM | POA: Diagnosis not present

## 2020-07-14 DIAGNOSIS — M542 Cervicalgia: Secondary | ICD-10-CM | POA: Insufficient documentation

## 2020-07-14 DIAGNOSIS — E785 Hyperlipidemia, unspecified: Secondary | ICD-10-CM | POA: Insufficient documentation

## 2020-07-14 DIAGNOSIS — J439 Emphysema, unspecified: Secondary | ICD-10-CM | POA: Insufficient documentation

## 2020-07-14 DIAGNOSIS — K219 Gastro-esophageal reflux disease without esophagitis: Secondary | ICD-10-CM | POA: Diagnosis not present

## 2020-07-14 NOTE — Progress Notes (Signed)
Radiation Oncology         [phone] ________________________________  Initial Outpatient Consultation  Name: Sara Fox MRN: [id number]  Date: 07/14/2020  DOB: 1941-03-06  CC:[name], FNP  Melrose Nakayama, *   REFERRING PHYSICIAN: Melrose Nakayama, *  DIAGNOSIS: The encounter diagnosis was Non-small cell carcinoma of right lung, stage 1 ([place]).  Stage IIA (T2b, N0, M0) non-small cell lung cancer of the right upper lobe  HISTORY OF PRESENT ILLNESS::Sara Fox is a 80 y.o. female who is seen as a courtesy of Dr. Roxan Hockey for an opinion concerning radiation therapy as part of management for her recently diagnosed lung cancer. Today, she is accompanied by her son. The patient underwent a chest CT scan on 05/11/2020 for history of cervical cancer and smoking. Results showed a 2.3 cm cystic/solid nodule with a 15 mm solid component in the right lung apex, suspicious for primary bronchogenic neoplasm such as invasive adenocarcinoma. There was also noted to be a 9 mm short axis low right paratracheal node, within the upper limits of normal.  The patient underwent a video bronchoscopy with endobronchial ultrasound and electromagnetic navigation on 05/25/2020 under the care of Dr. Lamonte Sakai. Cytology from the procedure revealed malignant cells consistent with non-small cell carcinoma of the right upper lobe brushing. The right upper lobe find needle aspiration was also suspicious for malignancy. Atypical cells were present in the fine needle aspiration of lymph node 4R. There were no malignant cells identified in the fine needle aspirations of lymph node 10R or 7.  MRI of brain on 06/21/2020 showed mild atrophy and mild chronic microvascular ischemic change in the white matter without metastatic disease to the brain.  PET scan on 06/21/2020 showed low-level FDG uptake associated with the ill-defined sub-solid 4.5 cm posterior apical right upper lobe lung mass, compatible with  biopsy-proven non-small cell lung malignancy. There was no hypermetabolic thoracic lymphadenopathy or distant metastatic disease.  The patient was seen by Dr. Julien Nordmann on 06/29/2020, who recommended that she be evaluated by cardiothoracic surgery for possible surgery. If she is not a surgical candidate, then she could be considered for curative radiotherapy.  The patient was seen by Dr. Roxan Hockey on 07/08/2020, at which time she was noted to not be a good candidate for surgical resection given her age, severe thoracic aortic atherosclerosis, severe coronary atherosclerosis, and continued smoking.  PREVIOUS RADIATION THERAPY: No  PAST MEDICAL HISTORY:  Past Medical History:  Diagnosis Date  . Acute kidney injury ([place])   . Back pain   . Cervical cancer ([place])   . CHF (congestive heart failure) ([place])   . COPD (chronic obstructive pulmonary disease) ([place])   . Coronary artery disease    s/p DES x2 to RCA 2016  . GERD (gastroesophageal reflux disease)   . Hyperlipidemia   . Hypertension   . Mild cognitive impairment   . Myocardial infarction ([place])   . Neck pain   . Rhabdomyolysis   . Stomach problems     PAST SURGICAL HISTORY: Past Surgical History:  Procedure Laterality Date  . ABDOMINAL HYSTERECTOMY    . BACK SURGERY  2012   lower back  . BRONCHIAL BIOPSY  05/25/2020   Procedure: BRONCHIAL BIOPSIES;  Surgeon: Collene Gobble, MD;  Location: [facility] ENDOSCOPY;  Service: Pulmonary;;  . BRONCHIAL BRUSHINGS  05/25/2020   Procedure: BRONCHIAL BRUSHINGS;  Surgeon: Collene Gobble, MD;  Location: [facility] ENDOSCOPY;  Service: Pulmonary;;  . BRONCHIAL NEEDLE ASPIRATION BIOPSY  05/25/2020  Procedure: BRONCHIAL NEEDLE ASPIRATION BIOPSIES;  Surgeon: Collene Gobble, MD;  Location: [facility] ENDOSCOPY;  Service: Pulmonary;;  . CARDIAC CATHETERIZATION  06/1999   noncritical disease invovling PDA  . CARDIAC CATHETERIZATION N/A 09/14/2014   Procedure: Left Heart Cath and Coronary Angiography;  Surgeon: Leonie Man, MD;  Location: [facility] INVASIVE CV LAB CUPID;  Service: Cardiovascular;  Laterality: N/A;  . CHOLECYSTECTOMY    . ESOPHAGOGASTRODUODENOSCOPY (EGD) WITH PROPOFOL Left 07/19/2017   Procedure: ESOPHAGOGASTRODUODENOSCOPY (EGD) WITH PROPOFOL;  Surgeon: Ronnette Juniper, MD;  Location: [facility] ENDOSCOPY;  Service: Gastroenterology;  Laterality: Left;  . FINE NEEDLE ASPIRATION  05/25/2020   Procedure: FINE NEEDLE ASPIRATION (FNA) LINEAR;  Surgeon: Collene Gobble, MD;  Location: [facility] ENDOSCOPY;  Service: Pulmonary;;  . FRACTURE SURGERY Left 05/2017   left wrist  . HARDWARE REMOVAL Left 11/07/2017   Procedure: LEFT WRIST HARDWARE REMOVAL;  Surgeon: Charlotte Crumb, MD;  Location: [place];  Service: Orthopedics;  Laterality: Left;  . KNEE SURGERY Bilateral 2001 & 2007  . NECK SURGERY  2012   2012  . PARTIAL GASTRECTOMY  2005   subtotal  . PERCUTANEOUS CORONARY STENT INTERVENTION (PCI-S)  09/14/2014   Procedure: Percutaneous Coronary Stent Intervention (Pci-S);  Surgeon: Leonie Man, MD;  Location: [facility] INVASIVE CV LAB CUPID;  Service: Cardiovascular;;  . SHOULDER SURGERY Right   . TRANSTHORACIC ECHOCARDIOGRAM  06/02/2010   EF=>55%, normal LV systolic function; normal RV systolic function; mild mitral annular calcif; trace TR; AV mildly sclerotic  . VIDEO BRONCHOSCOPY WITH ENDOBRONCHIAL NAVIGATION N/A 05/25/2020   Procedure: VIDEO BRONCHOSCOPY WITH ENDOBRONCHIAL NAVIGATION;  Surgeon: Collene Gobble, MD;  Location: [facility] ENDOSCOPY;  Service: Pulmonary;  Laterality: N/A;  . VIDEO BRONCHOSCOPY WITH ENDOBRONCHIAL ULTRASOUND N/A 05/25/2020   Procedure: VIDEO BRONCHOSCOPY WITH ENDOBRONCHIAL ULTRASOUND;  Surgeon: Collene Gobble, MD;  Location: [place] ENDOSCOPY;  Service: Pulmonary;  Laterality: N/A;  . WRIST OSTEOTOMY Left 11/07/2017   Procedure: LEFT WRIST DISTAL ULNA RESECTION WITH EXTENSOR CARPI ULNARIS STABILIZATION;  Surgeon: Charlotte Crumb, MD;  Location: [place];  Service: Orthopedics;  Laterality: Left;    FAMILY  HISTORY:  Family History  Problem Relation Age of Onset  . Heart disease Mother        also HTN  . Diabetes Mother   . Colon cancer Mother   . Heart disease Brother        deceased at 67  . Hypertension Brother   . Heart attack Brother   . Heart disease Brother   . Hypertension Brother     SOCIAL HISTORY:  Social History   Tobacco Use  . Smoking status: Current Every Day Smoker    Packs/day: 0.50    Years: 60.00    Pack years: 30.00  . Smokeless tobacco: Never Used  . Tobacco comment: currently smokes 1/2 ppd or less  Vaping Use  . Vaping Use: Former  Substance Use Topics  . Alcohol use: No  . Drug use: No    ALLERGIES:  Allergies  Allergen Reactions  . Buprenorphine Hcl Shortness Of Breath    Throat swelling/trouble breathing and lethargic  . Morphine And Related Shortness Of Breath    Throat swelling/trouble breathing and lethargic  . Celebrex [Celecoxib] Diarrhea and Swelling    Swelling of legs   . Vioxx [Rofecoxib] Palpitations  . Codeine Other (See Comments)    Bloated     MEDICATIONS:  Current Outpatient Medications  Medication Sig Dispense Refill  . Calcium Citrate-Vitamin D (CALCIUM CITRATE+D3 PETITES PO)  Take 1 tablet by mouth daily.    . cholecalciferol (VITAMIN D) 25 MCG (1000 UNIT) tablet Take 1,000 Units by mouth daily.    . ciclopirox (PENLAC) 8 % solution Apply 1 application topically at bedtime. Apply over nail and surrounding skin. Apply daily over previous coat. After seven (7) days, file nail and continue cycle.    . donepezil (ARICEPT) 5 MG tablet TAKE 1 TABLET BY MOUTH EVERYDAY AT BEDTIME (Patient taking differently: Take 5 mg by mouth at bedtime.) 90 tablet 1  . ferrous sulfate 325 (65 FE) MG tablet Take 325 mg by mouth daily with breakfast.    . furosemide (LASIX) 20 MG tablet Take 1 tablet (20 mg total) by mouth 3 (three) times a week. Sundays, Tuesdays & Thursdays.    . Magnesium 250 MG TABS Take 250 mg by mouth every evening.    .  metoprolol tartrate (LOPRESSOR) 25 MG tablet TAKE 1 TABLET BY MOUTH TWICE A DAY 180 tablet 1  . Multiple Vitamin (MULTIVITAMIN WITH MINERALS) TABS tablet Take 1 tablet by mouth daily.    Marland Kitchen oxybutynin (DITROPAN) 5 MG tablet TAKE 1 TABLET BY MOUTH TWICE A DAY (Patient taking differently: Take 5 mg by mouth 2 (two) times daily.) 180 tablet 1  . pantoprazole (PROTONIX) 40 MG tablet TAKE 1 TABLET BY MOUTH TWICE A DAY (Patient taking differently: Take 40 mg by mouth 2 (two) times daily.) 180 tablet 1  . Potassium Chloride ER 20 MEQ TBCR TAKE 2 TABLETS BY MOUTH EVERY MORNING (Patient taking differently: Take 40 mEq by mouth daily.) 180 tablet 3  . rosuvastatin (CRESTOR) 40 MG tablet Take 1 tablet (40 mg total) by mouth every evening.    . SYMBICORT 160-4.5 MCG/ACT inhaler TAKE 2 PUFFS BY MOUTH TWICE A DAY (Patient taking differently: Inhale 2 puffs into the lungs in the morning and at bedtime.) 30.6 each 1  . traMADol (ULTRAM) 50 MG tablet TAKE 1 TABLET 3 TIMES A DAY AND CAN TAKE 1 EXTRA 20 DAYS/MONTH 110 tablet 2  . aspirin EC 81 MG tablet Take 81 mg by mouth daily. (Patient not taking: Reported on 07/14/2020)    . gabapentin (NEURONTIN) 100 MG capsule Take 1 capsule (100 mg total) by mouth 2 (two) times daily. (Patient not taking: Reported on 07/14/2020)    . nitroGLYCERIN (NITROSTAT) 0.4 MG SL tablet PLACE ONE TABLET UNDER THE TONGUE EVERY 5 MINUTES AS NEEDED FOR CHEST PAIN. (Patient not taking: Reported on 07/14/2020) 50 tablet 0  . polyethylene glycol (MIRALAX / GLYCOLAX) packet Take 17 g by mouth daily as needed for mild constipation.  (Patient not taking: Reported on 07/14/2020)     No current facility-administered medications for this encounter.    REVIEW OF SYSTEMS:  A 10+ POINT REVIEW OF SYSTEMS WAS OBTAINED including neurology, dermatology, psychiatry, cardiac, respiratory, lymph, extremities, GI, GU, musculoskeletal, constitutional, reproductive, HEENT.  She denies any pain within the chest area  significant cough or shortness of breath.  Patient denies any hemoptysis.  The patient's son does report dementia but does feel the patient will be able to follow commands remain still during her radiation therapy.   PHYSICAL EXAM:  weight is 126 lb (57.2 kg). Her blood pressure is 107/60 and her pulse is 78. Her respiration is 18 and oxygen saturation is 99%.   General: Alert and oriented, in no acute distress HEENT: Head is normocephalic. Extraocular movements are intact.  Neck: Neck is supple, no palpable cervical or supraclavicular lymphadenopathy. Heart: Regular in  rate and rhythm with no murmurs, rubs, or gallops. Chest: Clear to auscultation bilaterally, with no rhonchi, wheezes, or rales. Abdomen: Soft, nontender, nondistended, with no rigidity or guarding. Extremities: No cyanosis or edema. Lymphatics: see Neck Exam Skin: No concerning lesions. Musculoskeletal: symmetric strength and muscle tone throughout. Neurologic: Cranial nerves II through XII are grossly intact. No obvious focalities. Speech is fluent. Coordination is intact. Psychiatric: Judgment and insight are intact. Affect is appropriate.   ECOG = 1  0 - Asymptomatic (Fully active, able to carry on all predisease activities without restriction)  1 - Symptomatic but completely ambulatory (Restricted in physically strenuous activity but ambulatory and able to carry out work of a light or sedentary nature. For example, light housework, office work)  2 - Symptomatic, <50% in bed during the day (Ambulatory and capable of all self care but unable to carry out any work activities. Up and about more than 50% of waking hours)  3 - Symptomatic, >50% in bed, but not bedbound (Capable of only limited self-care, confined to bed or chair 50% or more of waking hours)  4 - Bedbound (Completely disabled. Cannot carry on any self-care. Totally confined to bed or chair)  5 - Death   Eustace Pen MM, Creech RH, Tormey DC, et al. [phone]).  "Toxicity and response criteria of the [facility]". [place] Oncol. 5 (6): 649-55  LABORATORY DATA:  Lab Results  Component Value Date   WBC 5.0 06/08/2020   HGB 10.6 (L) 06/08/2020   HCT 35.0 (L) 06/08/2020   MCV 100.6 (H) 06/08/2020   PLT 243 06/08/2020   NEUTROABS 3.2 06/08/2020   Lab Results  Component Value Date   NA 141 06/08/2020   K 3.8 06/08/2020   CL 109 06/08/2020   CO2 26 06/08/2020   GLUCOSE 85 06/08/2020   CREATININE 1.42 (H) 06/08/2020   CALCIUM 9.6 06/08/2020      RADIOGRAPHY: MR BRAIN W WO CONTRAST  Result Date: 06/21/2020 CLINICAL DATA:  Non-small-cell lung cancer, staging EXAM: MRI HEAD WITHOUT AND WITH CONTRAST TECHNIQUE: Multiplanar, multiecho pulse sequences of the brain and surrounding structures were obtained without and with intravenous contrast. CONTRAST:  53mL GADAVIST GADOBUTROL 1 MMOL/ML IV SOLN COMPARISON:  CT head 07/28/2016 FINDINGS: Brain: Mild cerebral atrophy, appropriate for age. Negative for hydrocephalus. Mild white matter changes with scattered small deep white matter hyperintensities bilaterally. Negative for metastatic disease. No enhancing lesions. Negative for intracranial hemorrhage. No acute infarct identified. Vascular: Normal arterial flow voids Skull and upper cervical spine: No focal skeletal lesion. Degenerative change in the cervical spine with prior ACDF. Sinuses/Orbits: Paranasal sinuses clear.  Negative orbit Other: None IMPRESSION: Negative for metastatic disease to the brain Mild atrophy and mild chronic microvascular ischemic change in the white matter. Electronically Signed   By: Franchot Gallo M.D.   On: 06/21/2020 08:10   NM PET Image Initial (PI) Skull Base To Thigh  Result Date: 06/21/2020 CLINICAL DATA:  Initial treatment strategy for biopsy-proven non-small cell lung cancer diagnosed on 05/25/2020. EXAM: NUCLEAR MEDICINE PET SKULL BASE TO THIGH TECHNIQUE: 6.3 mCi F-18 FDG was injected intravenously.  Full-ring PET imaging was performed from the skull base to thigh after the radiotracer. CT data was obtained and used for attenuation correction and anatomic localization. Fasting blood glucose: 85 mg/dl COMPARISON:  05/11/2020 chest CT. FINDINGS: Mediastinal blood pool activity: SUV max 2.5 Liver activity: SUV max NA NECK: No hypermetabolic lymph nodes in the neck. Incidental CT findings: none CHEST: Ill-defined  subsolid 4.5 x 4.0 cm posterior apical right upper lobe lung mass (series 8/image 10) with 1.3 cm solid component with low level FDG uptake with max SUV 2.2. No hypermetabolic pulmonary findings. No enlarged or hypermetabolic axillary, mediastinal or hilar lymph nodes. Incidental CT findings: Coronary atherosclerosis. Atherosclerotic nonaneurysmal thoracic aorta. Mild centrilobular and paraseptal emphysema. ABDOMEN/PELVIS: No abnormal hypermetabolic activity within the liver, pancreas, adrenal glands, or spleen. No hypermetabolic lymph nodes in the abdomen or pelvis. Incidental CT findings: Cholecystectomy. Subcentimeter hypodense anterior liver lesion, too small to characterize, unchanged since 05/03/2020 CT. Bile ducts are upper normal for the post cholecystectomy state with CBD diameter 10 mm. Atherosclerotic nonaneurysmal abdominal aorta. Postsurgical changes from partial distal gastrectomy with gastrojejunostomy. Moderate left inguinal hernia containing small bowel loops with no evidence of acute bowel complication. Small fat containing midline supraumbilical ventral abdominal hernia. Hysterectomy. SKELETON: No focal hypermetabolic activity to suggest skeletal metastasis. Incidental CT findings: Bilateral posterior spinal fusion hardware in the lumbar spine. Surgical hardware from ACDF. Right shoulder arthroplasty. IMPRESSION: 1. Low level FDG uptake (max SUV 2.2) associated with the ill-defined subsolid 4.5 cm posterior apical right upper lobe lung mass, compatible with biopsy-proven non-small cell  lung malignancy. 2. No hypermetabolic thoracic lymphadenopathy or distant metastatic disease. PET-CT staging is T2b N0 M0 (stage IIA). 3. Chronic findings include: Aortic Atherosclerosis (ICD10-I70.0) and Emphysema (ICD10-J43.9). Coronary atherosclerosis. Moderate left inguinal hernia containing small bowel loops with no evidence of acute bowel complication. Electronically Signed   By: Ilona Sorrel M.D.   On: 06/21/2020 09:50      IMPRESSION: Stage IIA (T2b, N0, M0) non-small cell lung cancer of the right upper lobe  Given the patient's multiple medical issues as well as her age and dementia she is not felt to be a good candidate for surgical intervention.  Given the size of the lesion she would likely not be a good candidate for stereotactic body radiation therapy but would be a candidate for hypofractionated accelerated radiation therapy over approximately 2 weeks.  Today, I talked to the patient and son about the findings and work-up thus far.  We discussed the natural history of lung cancer and general treatment, highlighting the role of radiotherapy (hypofractionated accelerated course of treatment) in the management.  We discussed the available radiation techniques, and focused on the details of logistics and delivery.  We reviewed the anticipated acute and late sequelae associated with radiation in this setting.  The patient was encouraged to ask questions that I answered to the best of my ability.  A patient consent form was discussed and signed.  We retained a copy for our records.  The patient would like to proceed with radiation and will be scheduled for CT simulation.  PLAN: The patient is scheduled for CT simulation March 9 with treatments to begin approximately a week later.  Anticipate 10 daily radiation treatments but if she qualifies for SBRT then we will switch to 5 treatments on every other day basis.  Total time spent in this encounter was 60 minutes which included reviewing the  patient's most recent chest CT scan, PET scan, MRI of brain, consultations, follow-ups, bronchoscopy, cytology report, physical examination, and documentation.  ------------------------------------------------  Blair Promise, PhD, MD  This document serves as a record of services personally performed by Gery Pray, MD. It was created on his behalf by Clerance Lav, a trained medical scribe. The creation of this record is based on the scribe's personal observations and the provider's statements to them. This document  has been checked and approved by the attending provider.

## 2020-07-15 LAB — TOXASSURE SELECT,+ANTIDEPR,UR

## 2020-07-16 ENCOUNTER — Telehealth: Payer: Self-pay | Admitting: *Deleted

## 2020-07-16 NOTE — Telephone Encounter (Signed)
Urine drug screen for this encounter is consistent for prescribed medication 

## 2020-07-19 ENCOUNTER — Other Ambulatory Visit: Payer: Self-pay | Admitting: Nurse Practitioner

## 2020-07-19 DIAGNOSIS — G4700 Insomnia, unspecified: Secondary | ICD-10-CM

## 2020-07-19 DIAGNOSIS — G3184 Mild cognitive impairment, so stated: Secondary | ICD-10-CM

## 2020-07-20 ENCOUNTER — Other Ambulatory Visit: Payer: Self-pay

## 2020-07-20 ENCOUNTER — Ambulatory Visit
Admission: RE | Admit: 2020-07-20 | Discharge: 2020-07-20 | Disposition: A | Discharge status: Home / Self Care | Payer: Medicare Other | Source: Ambulatory Visit | Attending: Radiation Oncology | Admitting: Radiation Oncology

## 2020-07-20 DIAGNOSIS — F17200 Nicotine dependence, unspecified, uncomplicated: Secondary | ICD-10-CM | POA: Insufficient documentation

## 2020-07-20 DIAGNOSIS — Z510 Encounter for antineoplastic radiation therapy: Secondary | ICD-10-CM | POA: Diagnosis present

## 2020-07-20 DIAGNOSIS — C3411 Malignant neoplasm of upper lobe, right bronchus or lung: Secondary | ICD-10-CM | POA: Diagnosis present

## 2020-07-20 DIAGNOSIS — C3491 Malignant neoplasm of unspecified part of right bronchus or lung: Secondary | ICD-10-CM

## 2020-07-22 ENCOUNTER — Telehealth: Payer: Self-pay | Admitting: Radiation Oncology

## 2020-07-22 NOTE — Telephone Encounter (Signed)
Returned Sara Fox's call regarding patients next appointment. He stated that [name] lost her treatment schedule. I confirmed her 3/15 treatment time and advised that the therapist will give him another copy of her schedule at that time.

## 2020-07-25 ENCOUNTER — Other Ambulatory Visit: Payer: Self-pay | Admitting: Nurse Practitioner

## 2020-07-25 ENCOUNTER — Other Ambulatory Visit: Payer: Self-pay | Admitting: Internal Medicine

## 2020-07-25 DIAGNOSIS — G3184 Mild cognitive impairment, so stated: Secondary | ICD-10-CM

## 2020-07-26 ENCOUNTER — Telehealth: Payer: Medicare Other

## 2020-07-26 ENCOUNTER — Ambulatory Visit (INDEPENDENT_AMBULATORY_CARE_PROVIDER_SITE_OTHER): Payer: Medicare Other

## 2020-07-26 ENCOUNTER — Other Ambulatory Visit: Payer: Self-pay | Admitting: Nurse Practitioner

## 2020-07-26 DIAGNOSIS — N1831 Chronic kidney disease, stage 3a: Secondary | ICD-10-CM

## 2020-07-26 DIAGNOSIS — G3184 Mild cognitive impairment, so stated: Secondary | ICD-10-CM

## 2020-07-26 DIAGNOSIS — I10 Essential (primary) hypertension: Secondary | ICD-10-CM

## 2020-07-26 DIAGNOSIS — I5022 Chronic systolic (congestive) heart failure: Secondary | ICD-10-CM

## 2020-07-26 DIAGNOSIS — I25118 Atherosclerotic heart disease of native coronary artery with other forms of angina pectoris: Secondary | ICD-10-CM

## 2020-07-27 ENCOUNTER — Other Ambulatory Visit: Payer: Self-pay

## 2020-07-27 ENCOUNTER — Ambulatory Visit
Admission: RE | Admit: 2020-07-27 | Discharge: 2020-07-27 | Disposition: A | Discharge status: Home / Self Care | Payer: Medicare Other | Source: Ambulatory Visit | Attending: Radiation Oncology | Admitting: Radiation Oncology

## 2020-07-27 DIAGNOSIS — C3491 Malignant neoplasm of unspecified part of right bronchus or lung: Secondary | ICD-10-CM

## 2020-07-27 DIAGNOSIS — Z510 Encounter for antineoplastic radiation therapy: Secondary | ICD-10-CM | POA: Diagnosis not present

## 2020-07-27 NOTE — Patient Instructions (Signed)
Goals Addressed    . Follow My Treatment Plan-Chronic Kidney   On track    Timeframe:  Long-Range Goal Priority:  Medium Start Date: 07/26/20                           Expected End Date:  01/26/21                  Follow Up Date:  08/31/20   - call for medicine refill 2 or 3 days before it runs out - call the doctor or nurse before I stop taking medicine - call the doctor or nurse to get help with side effects - keep follow-up appointments - set alarm to remind of prescription refill date(s) - set alarm to remind to take medications    Why is this important?    Staying as healthy as you can is very important. This may mean making changes if you smoke, don't exercise or eat poorly.   A healthy lifestyle is an important goal for you.   Following the treatment plan and making changes may be hard.   Try some of these steps to help keep the disease from getting worse.     Notes:     . Optimal Cognitive Function   On track    Timeframe:  Long-Range Goal Priority:  Medium Start Date: 07/26/20                             Expected End Date: 01/26/21                     Next Follow Up date: 08/31/20  Patient Goals/Self-Care Activities:     . Understand treatment plan for new Cancer diagnosis   On track    Timeframe:  Long-Range Goal Priority:  High Start Date: 06/15/20                            Expected End Date:  12/13/20  Follow up date: 08/31/20  Over the next 180 days, patient will:  Complete scheduled MRI and PET scans as directed Keep all MD follow up appointments as scheduled Call your Oncology navigator for questions related to Cancer diagnosis and next steps Continue to work with PCP and CCM team for disease education and support and or for assistance with care coordination as needed

## 2020-07-27 NOTE — Chronic Care Management (AMB) (Signed)
Chronic Care Management   CCM RN Visit Note  07/26/2020 Name: Sara Fox MRN: [id number] DOB: [birth year]  Subjective: Sara Fox is a 80 y.o. year old female who is a primary care patient of [name], [place]. The care management team was consulted for assistance with disease management and care coordination needs.    Engaged with patient by telephone for follow up visit in response to provider referral for case management and/or care coordination services.   Consent to Services:  The patient was given information about Chronic Care Management services, agreed to services, and gave verbal consent prior to initiation of services.  Please see initial visit note for detailed documentation.   Patient agreed to services and verbal consent obtained.   Assessment: Review of patient past medical history, allergies, medications, health status, including review of consultants reports, laboratory and other test data, was performed as part of comprehensive evaluation and provision of chronic care management services.   SDOH (Social Determinants of Health) assessments and interventions performed:  Yes, no acute challenges identified   CCM Care Plan  Allergies  Allergen Reactions  . Buprenorphine Hcl Shortness Of Breath    Throat swelling/trouble breathing and lethargic  . Morphine And Related Shortness Of Breath    Throat swelling/trouble breathing and lethargic  . Celebrex [Celecoxib] Diarrhea and Swelling    Swelling of legs   . Vioxx [Rofecoxib] Palpitations  . Codeine Other (See Comments)    Bloated     Outpatient Encounter Medications as of 07/26/2020  Medication Sig Note  . aspirin EC 81 MG tablet Take 81 mg by mouth daily. (Patient not taking: Reported on 07/14/2020) 05/24/2020: On hold due to upcoming procedure.  . Calcium Citrate-Vitamin D (CALCIUM CITRATE+D3 PETITES PO) Take 1 tablet by mouth daily.   . cholecalciferol (VITAMIN D) 25 MCG (1000 UNIT) tablet Take 1,000 Units by  mouth daily.   . ciclopirox (PENLAC) 8 % solution Apply 1 application topically at bedtime. Apply over nail and surrounding skin. Apply daily over previous coat. After seven (7) days, file nail and continue cycle.   . [employer] MAGNESIUM OXIDE 250 MG TABS TAKE 1 TABLET BY MOUTH WITH EVENING MEAL DAILY   . donepezil (ARICEPT) 5 MG tablet TAKE 1 TABLET BY MOUTH EVERYDAY AT BEDTIME   . ferrous sulfate 325 (65 FE) MG tablet Take 325 mg by mouth daily with breakfast.   . furosemide (LASIX) 20 MG tablet TAKE 1 TABLET BY MOUTH EVERY OTHER DAY   . gabapentin (NEURONTIN) 100 MG capsule Take 1 capsule (100 mg total) by mouth 2 (two) times daily. (Patient not taking: Reported on 07/14/2020)   . Magnesium 250 MG TABS Take 250 mg by mouth every evening.   . metoprolol tartrate (LOPRESSOR) 25 MG tablet TAKE 1 TABLET BY MOUTH TWICE A DAY   . Multiple Vitamin (MULTIVITAMIN WITH MINERALS) TABS tablet Take 1 tablet by mouth daily.   . nitroGLYCERIN (NITROSTAT) 0.4 MG SL tablet PLACE ONE TABLET UNDER THE TONGUE EVERY 5 MINUTES AS NEEDED FOR CHEST PAIN. (Patient not taking: Reported on 07/14/2020)   . oxybutynin (DITROPAN) 5 MG tablet TAKE 1 TABLET BY MOUTH TWICE A DAY (Patient taking differently: Take 5 mg by mouth 2 (two) times daily.)   . pantoprazole (PROTONIX) 40 MG tablet Take 1 tablet (40 mg total) by mouth 2 (two) times daily.   . polyethylene glycol (MIRALAX / GLYCOLAX) packet Take 17 g by mouth daily as needed for mild constipation.  (Patient not taking:  Reported on 07/14/2020)   . Potassium Chloride ER 20 MEQ TBCR TAKE 2 TABLETS BY MOUTH EVERY MORNING (Patient taking differently: Take 40 mEq by mouth daily.)   . rosuvastatin (CRESTOR) 40 MG tablet Take 1 tablet (40 mg total) by mouth every evening.   . SYMBICORT 160-4.5 MCG/ACT inhaler TAKE 2 PUFFS BY MOUTH TWICE A DAY   . traMADol (ULTRAM) 50 MG tablet TAKE 1 TABLET 3 TIMES A DAY AND CAN TAKE 1 EXTRA 20 DAYS/MONTH 07/05/2020: Last filled on 06/25/2020 for # 110  On hand  today # 54 plus some at home per Son Last taken 07/04/2020   No facility-administered encounter medications on file as of 07/26/2020.    Patient Active Problem List   Diagnosis Date Noted  . Non-small cell carcinoma of right lung, stage 1 ([place]) 06/08/2020  . Pulmonary nodule 1 cm or greater in diameter 05/21/2020  . Atherosclerotic heart disease of native coronary artery with other forms of angina pectoris ([place]) 04/22/2020  . Fatigue 02/18/2019  . Memory loss 02/18/2019  . Vitamin B12 deficiency 10/23/2018  . Hypotension 10/23/2018  . Stage 3 chronic kidney disease ([place]) 06/03/2018  . Chronic anemia 06/03/2018  . Hypercholesterolemia 06/03/2018  . Malnutrition of moderate degree 07/12/2017  . Hypoalbuminemia 07/11/2017  . Elevated LFTs 07/11/2017  . Closed fracture of left distal radius 05/28/2017  . Snoring 12/21/2016  . OSA and COPD overlap syndrome ([place]) 12/21/2016  . Peripheral musculoskeletal gait disorder 02/05/2015  . Lumbar post-laminectomy syndrome 02/05/2015  . CAD in native artery 10/16/2014  . NSTEMI (non-ST elevated myocardial infarction) ([place]) 09/13/2014  . Cardiomyopathy, ischemic 09/13/2014  . Chronic systolic heart failure ([place])   . Tobacco abuse 09/12/2014  . Dyslipidemia 09/12/2014  . CKD (chronic kidney disease), stage II 09/12/2014  . Normocytic anemia 09/12/2014  . Chronic diastolic heart failure, NYHA class 1 ([place]) 09/12/2014  . Postlaminectomy syndrome, cervical region 08/01/2013  . Chronic midline low back pain with left-sided sciatica 08/01/2013  . Shoulder joint contracture 08/01/2013  . HTN (hypertension) 03/03/2013  . Abnormal genetic test 03/03/2013    Conditions to be addressed/monitored:Stage 3a chronic kidney disease, HTN, chronic systolic heart failure, Mild Cognitive Impairment, Atherosclerotic heart disease of native coronary artery with other forms of angina pectoris   Care Plan : Cancer Treatment Phase (Adult)  Updates made by Lynne Logan, RN since 07/27/2020 12:00 AM    Problem: Psychosocial Response to Cancer Diagnosis   Priority: High    Long-Range Goal: Optimal Coping   Start Date: 06/15/2020  Expected End Date: 12/13/2020  This Visit's Progress: On track  Recent Progress: On track  Priority: High  Note:   Current Barriers:   Ineffective Self Health Maintenance  Lacks social connections  Cognitive deficits   Currently UNABLE TO independently self manage needs related to chronic health conditions.   Knowledge Deficits related to short term plan for care coordination needs and long term plans for chronic disease management needs Clinical Goal(s):  Marland Kitchen Collaboration with [name], FNP regarding development and update of comprehensive plan of care as evidenced by provider attestation and co-signature . Inter-disciplinary care team collaboration (see longitudinal plan of care)  Over the next 180 days, patient will work with care management team to address care coordination and chronic disease management needs related to Disease Management  Educational Needs  Care Coordination  Medication Management and Education  Medication Reconciliation  Psychosocial Support  Dementia and Caregiver Support   Interventions:  07/26/20 Successful call completed  with son [name]  Evaluation of current treatment plan related to Malignant neoplasm of unspecified part of unspecified bronchus or lung self-management and patient's adherence to plan as established by provider.  Collaboration with [name], FNP regarding development and update of comprehensive plan of care as evidenced by provider attestation       and co-signature  Inter-disciplinary care team collaboration (see longitudinal plan of care)  Assessed for the patient's and family's understanding of the disease; use open-ended questions to encourage patient and family to share what is important to them.   Expressed empathy; listen actively by  encouraging the patient and family to express feelings, concerns and fears; ask questions and encourage open communication regarding embarrassing or disturbing topics.   Assessed for factors that may impact coping or adjustment, such as mental illness, prognosis, lack of social support, disfiguring disease, financial difficulties, or no or insufficient insurance coverage.   Assessed for caregiver burden by seeking to understand the range of responsibilities expected and the perceived impact of diagnosis on caregiver.   Reviewed and discussed next steps for Radiation therapy and MD f/u with Oncology   Discussed plans with patient for ongoing care management follow up and provided patient with direct contact information for care management team Patient Goals/Self Care Activities:  Over the next 180 days, patient will:  Keep all radiation treatment appointments as scheduled  Keep all MD follow up appointments as scheduled Call your Oncology navigator for questions related to Cancer diagnosis and next steps Continue to work with PCP and CCM team for disease education and support and or for assistance with care coordination as needed   Follow Up Plan: Telephone follow up appointment with care management team member scheduled for: 08/31/20    Care Plan : Chronic Kidney (Adult)  Updates made by Lynne Logan, RN since 07/27/2020 12:00 AM    Problem: Disease Progression   Priority: Medium    Long-Range Goal: Disease Progression Prevented or Minimized   Start Date: 07/26/2020  Expected End Date: 01/26/2021  This Visit's Progress: On track  Priority: Medium  Note:   Current Barriers:   Ineffective Self Health Maintenance  Unable to self administer medications as prescribed  Unable to perform ADLs independently  Unable to perform IADLs independently  Cognitive deficit  Clinical Goal(s):  Marland Kitchen Collaboration with [name], FNP regarding development and update of comprehensive plan of  care as evidenced by provider attestation and co-signature . Inter-disciplinary care team collaboration (see longitudinal plan of care)  patient will work with care management team to address care coordination and chronic disease management needs related to Disease Management  Educational Needs  Care Coordination  Medication Management and Education  Psychosocial Support  Dementia and Caregiver Support   Interventions:   Evaluation of current treatment plan related to CKD Stage III and patient's adherence to plan as established by provider.  Collaboration with [name], FNP regarding development and update of comprehensive plan of care as evidenced by provider attestation       and co-signature  Inter-disciplinary care team collaboration (see longitudinal plan of care)  Reviewed and discussed current GFR and educated on stages of renal disease  Educated on importance of drinking plenty of water, at least 64 oz per day unless otherwise directed   Educated on importance of taking all medications exactly as prescribed and to keep all MD follow up appointments   Discussed plans with patient for ongoing care management follow up and provided patient with direct contact  information for care management team Self Care Activities:  . Continue to keep all MD follow up appointments for ongoing monitoring of renal fuction . Continue to take all medications exactly as prescribed  . Continue to call the pharmacy for refills at least 7 days before taking last dose . Notify the CCM team and or PCP if you are unable to afford your medication refills . Drink plenty of water (64 oz daily) unless otherwise directed  . Call the CCM team and or PCP for questions or concerns  Patient Goals: - to maintain/improve renal function  Follow Up Plan: Telephone follow up appointment with care management team member scheduled for: 08/31/20    Plan:Telephone follow up appointment with care management  team member scheduled for:  08/31/20  Barb Merino, RN, BSN, CCM Care Management Coordinator [place] [facility]  Direct Phone: [phone]

## 2020-07-28 ENCOUNTER — Telehealth: Payer: Medicare Other

## 2020-07-28 ENCOUNTER — Telehealth: Payer: Self-pay

## 2020-07-28 ENCOUNTER — Ambulatory Visit: Payer: Medicare Other | Admitting: Radiation Oncology

## 2020-07-28 NOTE — Telephone Encounter (Signed)
  Chronic Care Management   Outreach Note  07/28/2020 Name: Sara Fox MRN: [id number] DOB: 1941-02-03  Referred by: [name], FNP Reason for referral : Chronic Care Management   An unsuccessful telephone outreach was attempted today. The patient was referred to the case management team for assistance with care management and care coordination.   Follow Up Plan: A HIPAA compliant phone message was left for the patient providing contact information and requesting a return call.  The care management team will reach out to the patient again over the next 30 days.   [name], BSW, CDP Social Worker, Certified Dementia Practitioner [place] [facility] [place] [facility] [phone]

## 2020-07-29 ENCOUNTER — Telehealth: Payer: Self-pay

## 2020-07-29 ENCOUNTER — Ambulatory Visit: Payer: Medicare Other | Admitting: Radiation Oncology

## 2020-07-29 NOTE — Chronic Care Management (AMB) (Signed)
Chronic Care Management Pharmacy Assistant   Name: AURIAH HOLLINGS  MRN: [id number] DOB: 1941-04-16   Reason for Encounter: Hypertension Adherence Call/Schedule CCM Call Follow up   Recent office visits:  07/26/20-Little, Claudette Stapler, RN (CCM). 07/02/20-Humble, Tillie Rung. 06/01/20-Humble, Kendra (CCM). 05/21/20-Byrum, Rose Fillers, MD (OV).   Recent consult visits:  07/14/20-Kinard, Jeneen Rinks, MD (Consult). 07/08/20-Hendrickson, Revonda Standard, MD (Surgical Consult). 07/05/20-Thomas, Vanessa Ralphs, NP. 06/29/20-Mohamed, Julien Nordmann, MD (OV). 06/15/20-Little, Claudette Stapler, RN (CCM). 06/08/20-Mohamed, Julien Nordmann, MD (OV).   Hospital visits:  Medication Reconciliation was completed by comparing discharge summary, patient's EMR and Pharmacy list, and upon discussion with patient.  Admitted to the hospital on 05/25/20 due to Pulmonary nodule 1 cm or greater in diameter . Discharge date was 05/25/2020. Discharged from [facility]?Medications Started at [facility] Discharge:?? None  Medication Changes at Hospital Discharge: None  Medications Discontinued at Hospital Discharge: None  Medications that remain the same after Hospital Discharge:??  -All other medications will remain the same.    Medications: Outpatient Encounter Medications as of 07/29/2020  Medication Sig Note  . aspirin EC 81 MG tablet Take 81 mg by mouth daily. (Patient not taking: Reported on 07/14/2020) 05/24/2020: On hold due to upcoming procedure.  . Calcium Citrate-Vitamin D (CALCIUM CITRATE+D3 PETITES PO) Take 1 tablet by mouth daily.   . cholecalciferol (VITAMIN D) 25 MCG (1000 UNIT) tablet Take 1,000 Units by mouth daily.   . ciclopirox (PENLAC) 8 % solution Apply 1 application topically at bedtime. Apply over nail and surrounding skin. Apply daily over previous coat. After seven (7) days, file nail and continue cycle.   . [employer] MAGNESIUM OXIDE 250 MG TABS TAKE 1 TABLET BY MOUTH WITH EVENING MEAL DAILY   . donepezil (ARICEPT) 5  MG tablet TAKE 1 TABLET BY MOUTH EVERYDAY AT BEDTIME   . ferrous sulfate 325 (65 FE) MG tablet Take 325 mg by mouth daily with breakfast.   . furosemide (LASIX) 20 MG tablet TAKE 1 TABLET BY MOUTH EVERY OTHER DAY   . gabapentin (NEURONTIN) 100 MG capsule Take 1 capsule (100 mg total) by mouth 2 (two) times daily. (Patient not taking: Reported on 07/14/2020)   . Magnesium 250 MG TABS Take 250 mg by mouth every evening.   . metoprolol tartrate (LOPRESSOR) 25 MG tablet TAKE 1 TABLET BY MOUTH TWICE A DAY   . Multiple Vitamin (MULTIVITAMIN WITH MINERALS) TABS tablet Take 1 tablet by mouth daily.   . nitroGLYCERIN (NITROSTAT) 0.4 MG SL tablet PLACE ONE TABLET UNDER THE TONGUE EVERY 5 MINUTES AS NEEDED FOR CHEST PAIN. (Patient not taking: Reported on 07/14/2020)   . oxybutynin (DITROPAN) 5 MG tablet TAKE 1 TABLET BY MOUTH TWICE A DAY (Patient taking differently: Take 5 mg by mouth 2 (two) times daily.)   . pantoprazole (PROTONIX) 40 MG tablet Take 1 tablet (40 mg total) by mouth 2 (two) times daily.   . polyethylene glycol (MIRALAX / GLYCOLAX) packet Take 17 g by mouth daily as needed for mild constipation.  (Patient not taking: Reported on 07/14/2020)   . Potassium Chloride ER 20 MEQ TBCR TAKE 2 TABLETS BY MOUTH EVERY MORNING (Patient taking differently: Take 40 mEq by mouth daily.)   . rosuvastatin (CRESTOR) 40 MG tablet Take 1 tablet (40 mg total) by mouth every evening.   . SYMBICORT 160-4.5 MCG/ACT inhaler TAKE 2 PUFFS BY MOUTH TWICE A DAY   . traMADol (ULTRAM) 50 MG tablet TAKE 1 TABLET 3 TIMES A DAY  AND CAN TAKE 1 EXTRA 20 DAYS/MONTH 07/05/2020: Last filled on 06/25/2020 for # 110  On hand today # 54 plus some at home per Son Last taken 07/04/2020   No facility-administered encounter medications on file as of 07/29/2020.    Reviewed chart prior to disease state call. Spoke with patient regarding BP  Recent Office Vitals: BP Readings from Last 3 Encounters:  07/14/20 107/60  07/08/20 (!) 126/58   07/05/20 99/64   Pulse Readings from Last 3 Encounters:  07/14/20 78  07/08/20 (!) 57  07/05/20 79    Wt Readings from Last 3 Encounters:  07/14/20 126 lb (57.2 kg)  07/08/20 126 lb (57.2 kg)  07/05/20 125 lb 6.4 oz (56.9 kg)     Kidney Function Lab Results  Component Value Date/Time   CREATININE 1.42 (H) 06/08/2020 01:36 PM   CREATININE 1.39 (H) 05/21/2020 10:07 AM   CREATININE 1.27 (H) 04/22/2020 09:57 AM   GFR 35.79 (L) 05/21/2020 10:07 AM   GFRNONAA 37 (L) 06/08/2020 01:36 PM   GFRAA 46 (L) 04/22/2020 09:57 AM    BMP Latest Ref Rng & Units 06/08/2020 05/21/2020 04/22/2020  Glucose 70 - 99 mg/dL 85 90 85  BUN 8 - 23 mg/dL _0 Creatinine 0.44 - 1.00 mg/dL 1.42(H) 1.39(H) 1.27(H)  BUN/Creat Ratio 12 - 28 - - 15  Sodium 135 - 145 mmol/L 141 140 143  Potassium 3.5 - 5.1 mmol/L 3.8 4.4 4.8  Chloride 98 - 111 mmol/L 109 107 111(H)  CO2 22 - 32 mmol/L 26 29 19(L)  Calcium 8.9 - 10.3 mg/dL 9.6 9.5 9.1    . Current antihypertensive regimen:  ? Aspirin 81 mg daily. ? Nitrostat 0.4 SL tablet PRN ? Lopressor 25 mg tablet 2 times daily ? Lasix 20 mg tablet every other day  . How often are you checking your Blood Pressure? weeklyand when feeling symptomatic.   . Current home BP readings: 110/76 or 77 taken at radiology appointment 07/26/20, per patient's son.  . What recent interventions/DTPs have been made by any provider to improve Blood Pressure control since last CPP Visit:  - Patient's son reports the patient is taking medications as directed.   . Any recent hospitalizations or ED visits since last visit with CPP? No   . What diet changes have been made to improve Blood Pressure Control?  o Patient's son reports patient eats shrimp, tuna, canned chopped chicken, crackers, veggies,  o Patient's son voiced he will take the patient out to eat or his fiance will cook a meal for her that consist of pork, chop, mac and cheese, veggies. o Patient's son verbalized the  patient had surgery where they removed part of her stomach so she can only tolerate small portioned food.  . What exercise is being done to improve your Blood Pressure Control?  o Patient's son reports the patient is active and take care of her 2 dogs.   Adherence Review: Is the patient currently on ACE/ARB medication? No Does the patient have >5 day gap between last estimated fill dates? No  Star Rating Drugs: Rosuvastatin 40 mg tablet: #90 DS, last filled on 07/07/20 at [facility].  Note: Patient's son reports the patient has been quiet for these past few days but she is alert and active. Patient's son verbalized he is having a rough time with the patient attending her scheduled doctor visits.   08/06/20-Called patient's son to schedule patient to follow up with Orlando Penner, CPP for a  CCM Call visit. Patient was unable to determine an available time and date at this time. I told the patient I will give him a call back next week to discuss.  08/10/20-Called and spoke with the patient's son to schedule the patient for a CCM Call follow up. Patient's voiced he is free before 12 PM for phone visit with the patient. Asked the patient would he be open for 08/31/20 at 9:00 AM. Patient voiced yes and confirmed.  Staff message was sent to Laverda Sorenson, NT on 08/10/20 at 3:07 PM.   Orlando Penner, CPP Notified.   Raynelle Highland, [place] Pharmacist Assistant [phone] CCM Total Time: 66 minutes

## 2020-07-30 ENCOUNTER — Telehealth: Payer: Medicare Other

## 2020-07-30 ENCOUNTER — Ambulatory Visit: Payer: Medicare Other | Admitting: Radiation Oncology

## 2020-07-30 ENCOUNTER — Ambulatory Visit: Payer: Self-pay

## 2020-07-30 DIAGNOSIS — I5022 Chronic systolic (congestive) heart failure: Secondary | ICD-10-CM | POA: Diagnosis not present

## 2020-07-30 DIAGNOSIS — I25118 Atherosclerotic heart disease of native coronary artery with other forms of angina pectoris: Secondary | ICD-10-CM

## 2020-07-30 DIAGNOSIS — R911 Solitary pulmonary nodule: Secondary | ICD-10-CM

## 2020-07-30 DIAGNOSIS — N1831 Chronic kidney disease, stage 3a: Secondary | ICD-10-CM

## 2020-07-30 DIAGNOSIS — IMO0001 Reserved for inherently not codable concepts without codable children: Secondary | ICD-10-CM

## 2020-07-30 DIAGNOSIS — I10 Essential (primary) hypertension: Secondary | ICD-10-CM

## 2020-07-30 DIAGNOSIS — G3184 Mild cognitive impairment, so stated: Secondary | ICD-10-CM

## 2020-08-02 ENCOUNTER — Ambulatory Visit
Admission: RE | Admit: 2020-08-02 | Discharge: 2020-08-02 | Disposition: A | Discharge status: Home / Self Care | Payer: Medicare Other | Source: Ambulatory Visit | Attending: Radiation Oncology | Admitting: Radiation Oncology

## 2020-08-02 DIAGNOSIS — C3491 Malignant neoplasm of unspecified part of right bronchus or lung: Secondary | ICD-10-CM

## 2020-08-02 DIAGNOSIS — Z510 Encounter for antineoplastic radiation therapy: Secondary | ICD-10-CM | POA: Diagnosis not present

## 2020-08-03 ENCOUNTER — Other Ambulatory Visit: Payer: Self-pay

## 2020-08-03 ENCOUNTER — Ambulatory Visit: Payer: Medicare Other | Admitting: Radiation Oncology

## 2020-08-03 ENCOUNTER — Encounter: Payer: Self-pay | Admitting: Nurse Practitioner

## 2020-08-03 ENCOUNTER — Ambulatory Visit (INDEPENDENT_AMBULATORY_CARE_PROVIDER_SITE_OTHER): Payer: Medicare Other | Admitting: Nurse Practitioner

## 2020-08-03 VITALS — BP 116/78 | HR 110 | Temp 98.1°F | Ht 65.0 in | Wt 120.8 lb

## 2020-08-03 DIAGNOSIS — G3184 Mild cognitive impairment, so stated: Secondary | ICD-10-CM

## 2020-08-03 DIAGNOSIS — C3491 Malignant neoplasm of unspecified part of right bronchus or lung: Secondary | ICD-10-CM

## 2020-08-03 DIAGNOSIS — R413 Other amnesia: Secondary | ICD-10-CM | POA: Diagnosis not present

## 2020-08-03 DIAGNOSIS — E7800 Pure hypercholesterolemia, unspecified: Secondary | ICD-10-CM

## 2020-08-03 LAB — POCT URINALYSIS DIPSTICK
Bilirubin, UA: NEGATIVE
Blood, UA: NEGATIVE
Glucose, UA: NEGATIVE
Ketones, UA: NEGATIVE
Leukocytes, UA: NEGATIVE
Nitrite, UA: NEGATIVE
Protein, UA: POSITIVE — AB
Spec Grav, UA: 1.01 (ref 1.010–1.025)
Urobilinogen, UA: 0.2 E.U./dL
pH, UA: 6 (ref 5.0–8.0)

## 2020-08-03 MED ORDER — QUETIAPINE FUMARATE 25 MG PO TABS
25.0000 mg | ORAL_TABLET | Freq: Every day | ORAL | 3 refills | Status: DC
Start: 2020-08-03 — End: 2020-08-30

## 2020-08-03 MED ORDER — DONEPEZIL HCL 10 MG PO TABS: 10.0000 mg | ORAL_TABLET | Freq: Every day | ORAL | 1 refills | Status: DC

## 2020-08-03 NOTE — Progress Notes (Addendum)
I,Sara Fox,acting as a Education administrator for Sara Brine, FNP.,have documented all relevant documentation on the behalf of Sara Brine, FNP,as directed by  Sara Brine, FNP while in the presence of Sara Fox, Sara Fox.  This visit occurred during the SARS-CoV-2 public health emergency.  Safety protocols were in place, including screening questions prior to the visit, additional usage of staff PPE, and extensive cleaning of exam room while observing appropriate contact time as indicated for disinfecting solutions.  Subjective:     Patient ID: Sara Fox , female    DOB: [birth year] , 80 y.o.   MRN: [id number]   Chief Complaint  Patient presents with  . Hyperlipidemia  . behavior concerns    HPI  Patient here for a f/u on her cholesterol & dementia.   She is packing all of her belongings into the hallway and in the living room.   Yesterday when her son came home he reports she had locked herself out of the house. He reports she turned the camera around and will cover the camera up. He reports she is not bathing and washing. She is wearing the same clothes as yesterday.  Her son feels after her first treatment it triggered a "mean streak", she was to have a second treatment on Thursday but did not go due to the patient not wanting to go.   She states "they want her to think there is a house full of people but no one is there". She rambles on about "buckets". She is having radiation treatment 3 times a week for 8 weeks.  Hyperlipidemia This is a chronic problem. The problem is uncontrolled. There are no known factors aggravating her hyperlipidemia. Pertinent negatives include no chest pain. There are no compliance problems.  Risk factors for coronary artery disease include a sedentary lifestyle.     Past Medical History:  Diagnosis Date  . Acute kidney injury ([place])   . Back pain   . Cervical cancer ([place])   . CHF (congestive heart failure) ([place])   . COPD (chronic obstructive pulmonary  disease) ([place])   . Coronary artery disease    s/p DES x2 to RCA 2016  . GERD (gastroesophageal reflux disease)   . Hyperlipidemia   . Hypertension   . Mild cognitive impairment   . Myocardial infarction ([place])   . Neck pain   . Rhabdomyolysis   . Stomach problems      Family History  Problem Relation Age of Onset  . Heart disease Mother        also HTN  . Diabetes Mother   . Colon cancer Mother   . Heart disease Brother        deceased at 54  . Hypertension Brother   . Heart attack Brother   . Heart disease Brother   . Hypertension Brother      Current Outpatient Medications:  .  Calcium Citrate-Vitamin D (CALCIUM CITRATE+D3 PETITES PO), Take 1 tablet by mouth daily., Disp: , Rfl:  .  cholecalciferol (VITAMIN D) 25 MCG (1000 UNIT) tablet, Take 1,000 Units by mouth daily., Disp: , Rfl:  .  ciclopirox (PENLAC) 8 % solution, Apply 1 application topically at bedtime. Apply over nail and surrounding skin. Apply daily over previous coat. After seven (7) days, file nail and continue cycle., Disp: , Rfl:  .  [employer] MAGNESIUM OXIDE 250 MG TABS, TAKE 1 TABLET BY MOUTH WITH EVENING MEAL DAILY, Disp: 90 tablet, Rfl: 1 .  ferrous sulfate 325 (65 FE) MG tablet,  Take 325 mg by mouth daily with breakfast., Disp: , Rfl:  .  furosemide (LASIX) 20 MG tablet, TAKE 1 TABLET BY MOUTH EVERY OTHER DAY, Disp: 45 tablet, Rfl: 1 .  Magnesium 250 MG TABS, Take 250 mg by mouth every evening., Disp: , Rfl:  .  metoprolol tartrate (LOPRESSOR) 25 MG tablet, TAKE 1 TABLET BY MOUTH TWICE A DAY, Disp: 180 tablet, Rfl: 1 .  Multiple Vitamin (MULTIVITAMIN WITH MINERALS) TABS tablet, Take 1 tablet by mouth daily., Disp: , Rfl:  .  oxybutynin (DITROPAN) 5 MG tablet, TAKE 1 TABLET BY MOUTH TWICE A DAY (Patient taking differently: Take 5 mg by mouth 2 (two) times daily.), Disp: 180 tablet, Rfl: 1 .  pantoprazole (PROTONIX) 40 MG tablet, Take 1 tablet (40 mg total) by mouth 2 (two) times daily., Disp: 180 tablet, Rfl: 1 .   Potassium Chloride ER 20 MEQ TBCR, TAKE 2 TABLETS BY MOUTH EVERY MORNING (Patient taking differently: Take 40 mEq by mouth daily.), Disp: 180 tablet, Rfl: 3 .  QUEtiapine (SEROQUEL) 25 MG tablet, Take 1 tablet (25 mg total) by mouth at bedtime., Disp: 30 tablet, Rfl: 3 .  rosuvastatin (CRESTOR) 40 MG tablet, Take 1 tablet (40 mg total) by mouth every evening., Disp: , Rfl:  .  SYMBICORT 160-4.5 MCG/ACT inhaler, TAKE 2 PUFFS BY MOUTH TWICE A DAY, Disp: 30.6 each, Rfl: 1 .  traMADol (ULTRAM) 50 MG tablet, TAKE 1 TABLET 3 TIMES A DAY AND CAN TAKE 1 EXTRA 20 DAYS/MONTH, Disp: 110 tablet, Rfl: 2 .  aspirin EC 81 MG tablet, Take 81 mg by mouth daily. (Patient not taking: No sig reported), Disp: , Rfl:  .  donepezil (ARICEPT) 10 MG tablet, Take 1 tablet (10 mg total) by mouth at bedtime., Disp: 90 tablet, Rfl: 1 .  gabapentin (NEURONTIN) 100 MG capsule, Take 1 capsule (100 mg total) by mouth 2 (two) times daily. (Patient not taking: No sig reported), Disp: , Rfl:  .  nitroGLYCERIN (NITROSTAT) 0.4 MG SL tablet, PLACE ONE TABLET UNDER THE TONGUE EVERY 5 MINUTES AS NEEDED FOR CHEST PAIN. (Patient not taking: No sig reported), Disp: 50 tablet, Rfl: 0 .  polyethylene glycol (MIRALAX / GLYCOLAX) packet, Take 17 g by mouth daily as needed for mild constipation.  (Patient not taking: No sig reported), Disp: , Rfl:    Allergies  Allergen Reactions  . Buprenorphine Hcl Shortness Of Breath    Throat swelling/trouble breathing and lethargic  . Morphine And Related Shortness Of Breath    Throat swelling/trouble breathing and lethargic  . Celebrex [Celecoxib] Diarrhea and Swelling    Swelling of legs   . Vioxx [Rofecoxib] Palpitations  . Codeine Other (See Comments)    Bloated      Review of Systems  Unable to perform ROS: Dementia  Constitutional: Negative.   Respiratory: Negative.  Negative for cough and wheezing.   Cardiovascular: Negative.  Negative for chest pain, palpitations and leg swelling.   Neurological: Negative.   Psychiatric/Behavioral: Positive for behavioral problems, hallucinations (son reports she is talking about people who are not present) and sleep disturbance (mild at times). The patient is not nervous/anxious.      Today's Vitals   08/03/20 1135  BP: 116/78  Pulse: (!) 110  Temp: 98.1 F (36.7 C)  Weight: 120 lb 12.8 oz (54.8 kg)  Height: 5\' 5"  (1.651 m)  PainSc: 0-No pain   Body mass index is 20.1 kg/m.   Objective:  Physical Exam Constitutional:  General: She is not in acute distress.    Appearance: Normal appearance. She is normal weight.  Cardiovascular:     Rate and Rhythm: Normal rate and regular rhythm.     Pulses: Normal pulses.     Heart sounds: Normal heart sounds. No murmur heard.   Pulmonary:     Effort: Pulmonary effort is normal. No respiratory distress.     Breath sounds: Normal breath sounds. No wheezing.  Skin:    General: Skin is warm and dry.     Capillary Refill: Capillary refill takes less than 2 seconds.  Neurological:     General: No focal deficit present.     Mental Status: She is alert and oriented to person, place, and time.     Cranial Nerves: No cranial nerve deficit.  Psychiatric:        Mood and Affect: Mood normal.        Thought Content: Thought content is delusional.        Cognition and Memory: Cognition is impaired. Memory is impaired.     Comments: She is cooperative with me but with her son she is not as cooperative.          Assessment And Plan:     1. Mild cognitive impairment with memory loss  I will increase her donepezil and add seroquel since she is having more hallucinations and seems to be more agitated.   I will refer to palliative care for symptom management for memory changes and she has been diagnosed with lung cancer and is going through chemotherapy. They may be able to help with some education for the son and family.  Recent MRI of brain is negative for any tumors or mets, shows  mild atrophy and mild chronic microvascular ischemic change in the white matter  I did explain to the son to try not to challenge the patient when she seems confused this can make her more agitated  - donepezil (ARICEPT) 10 MG tablet; Take 1 tablet (10 mg total) by mouth at bedtime.  Dispense: 90 tablet; Refill: 1 - QUEtiapine (SEROQUEL) 25 MG tablet; Take 1 tablet (25 mg total) by mouth at bedtime.  Dispense: 30 tablet; Refill: 3 - Ambulatory referral to Hospice  2. Memory changes  She is having worsening memory changes   Urine is negative for leukocytes or nitrates, least likely a urinary tract infection and she did not have enough urine to send for a culture - POCT Urinalysis Dipstick (49675) - Ambulatory referral to Hospice  3. Hypercholesterolemia  Chronic, stable.  Continue with current medication  4. Non-small cell carcinoma of right lung, stage 1 ([place])  Continue with follow up with Oncology - Ambulatory referral to Hospice     Patient was given opportunity to ask questions. Patient verbalized understanding of the plan and was able to repeat key elements of the plan. All questions were answered to their satisfaction.  Sara Brine, FNP    I, Sara Brine, FNP, have reviewed all documentation for this visit. The documentation on 08/03/20 for the exam, diagnosis, procedures, and orders are all accurate and complete.  IF YOU HAVE BEEN REFERRED TO A SPECIALIST, IT MAY TAKE 1-2 WEEKS TO SCHEDULE/PROCESS THE REFERRAL. IF YOU HAVE NOT HEARD FROM US/SPECIALIST IN TWO WEEKS, PLEASE GIVE [place] A CALL AT [phone].   THE PATIENT IS ENCOURAGED TO PRACTICE SOCIAL DISTANCING DUE TO THE COVID-19 PANDEMIC.

## 2020-08-04 ENCOUNTER — Ambulatory Visit: Payer: Medicare Other | Admitting: Radiation Oncology

## 2020-08-04 ENCOUNTER — Telehealth: Payer: Self-pay

## 2020-08-04 NOTE — Progress Notes (Signed)
This encounter was created in error - please disregard.

## 2020-08-04 NOTE — Patient Instructions (Signed)
Goals Addressed    . Optimal Cognitive Function   Not on track    Timeframe:  Long-Range Goal Priority:  Medium Start Date: 07/26/20                             Expected End Date: 01/26/21                     Next Follow Up date: 08/05/20  Patient Goals/Self-Care Activities:   - Maintain Optimal Cognitive Function  - Follow up with PCP for evatuation of change in cognitive function and behavior

## 2020-08-04 NOTE — Chronic Care Management (AMB) (Signed)
Chronic Care Management   CCM RN Visit Note  07/30/2020 Name: Sara Fox MRN: [id number] DOB: [birth year]  Subjective: Sara Fox is a 80 y.o. year old female who is a primary care patient of [name], [place]. The care management team was consulted for assistance with disease management and care coordination needs.    Engaged with patient by telephone for follow up visit in response to provider referral for case management and/or care coordination services.   Consent to Services:  The patient was given information about Chronic Care Management services, agreed to services, and gave verbal consent prior to initiation of services.  Please see initial visit note for detailed documentation.   Patient agreed to services and verbal consent obtained.   Assessment: Review of patient past medical history, allergies, medications, health status, including review of consultants reports, laboratory and other test data, was performed as part of comprehensive evaluation and provision of chronic care management services.   SDOH (Social Determinants of Health) assessments and interventions performed:    CCM Care Plan  Allergies  Allergen Reactions   Buprenorphine Hcl Shortness Of Breath    Throat swelling/trouble breathing and lethargic   Morphine And Related Shortness Of Breath    Throat swelling/trouble breathing and lethargic   Celebrex [Celecoxib] Diarrhea and Swelling    Swelling of legs    Vioxx [Rofecoxib] Palpitations   Codeine Other (See Comments)    Bloated     Outpatient Encounter Medications as of 07/30/2020  Medication Sig Note   aspirin EC 81 MG tablet Take 81 mg by mouth daily. (Patient not taking: No sig reported) 05/24/2020: On hold due to upcoming procedure.   Calcium Citrate-Vitamin D (CALCIUM CITRATE+D3 PETITES PO) Take 1 tablet by mouth daily.    cholecalciferol (VITAMIN D) 25 MCG (1000 UNIT) tablet Take 1,000 Units by mouth daily.    ciclopirox (PENLAC) 8  % solution Apply 1 application topically at bedtime. Apply over nail and surrounding skin. Apply daily over previous coat. After seven (7) days, file nail and continue cycle.    CVS MAGNESIUM OXIDE 250 MG TABS TAKE 1 TABLET BY MOUTH WITH EVENING MEAL DAILY    ferrous sulfate 325 (65 FE) MG tablet Take 325 mg by mouth daily with breakfast.    furosemide (LASIX) 20 MG tablet TAKE 1 TABLET BY MOUTH EVERY OTHER DAY    gabapentin (NEURONTIN) 100 MG capsule Take 1 capsule (100 mg total) by mouth 2 (two) times daily. (Patient not taking: No sig reported)    Magnesium 250 MG TABS Take 250 mg by mouth every evening.    metoprolol tartrate (LOPRESSOR) 25 MG tablet TAKE 1 TABLET BY MOUTH TWICE A DAY    Multiple Vitamin (MULTIVITAMIN WITH MINERALS) TABS tablet Take 1 tablet by mouth daily.    nitroGLYCERIN (NITROSTAT) 0.4 MG SL tablet PLACE ONE TABLET UNDER THE TONGUE EVERY 5 MINUTES AS NEEDED FOR CHEST PAIN. (Patient not taking: No sig reported)    oxybutynin (DITROPAN) 5 MG tablet TAKE 1 TABLET BY MOUTH TWICE A DAY (Patient taking differently: Take 5 mg by mouth 2 (two) times daily.)    pantoprazole (PROTONIX) 40 MG tablet Take 1 tablet (40 mg total) by mouth 2 (two) times daily.    polyethylene glycol (MIRALAX / GLYCOLAX) packet Take 17 g by mouth daily as needed for mild constipation.  (Patient not taking: No sig reported)    Potassium Chloride ER 20 MEQ TBCR TAKE 2 TABLETS BY MOUTH EVERY MORNING (Patient taking  differently: Take 40 mEq by mouth daily.)    rosuvastatin (CRESTOR) 40 MG tablet Take 1 tablet (40 mg total) by mouth every evening.    SYMBICORT 160-4.5 MCG/ACT inhaler TAKE 2 PUFFS BY MOUTH TWICE A DAY    traMADol (ULTRAM) 50 MG tablet TAKE 1 TABLET 3 TIMES A DAY AND CAN TAKE 1 EXTRA 20 DAYS/MONTH 07/05/2020: Last filled on 06/25/2020 for # 110  On hand today # 54 plus some at home per Son Last taken 07/04/2020   [DISCONTINUED] donepezil (ARICEPT) 5 MG tablet TAKE 1 TABLET BY MOUTH  EVERYDAY AT BEDTIME    No facility-administered encounter medications on file as of 07/30/2020.    Patient Active Problem List   Diagnosis Date Noted   Non-small cell carcinoma of right lung, stage 1 ([place]) 06/08/2020   Pulmonary nodule 1 cm or greater in diameter 05/21/2020   Atherosclerotic heart disease of native coronary artery with other forms of angina pectoris ([place]) 04/22/2020   Fatigue 02/18/2019   Memory loss 02/18/2019   Vitamin B12 deficiency 10/23/2018   Hypotension 10/23/2018   Stage 3 chronic kidney disease ([place]) 06/03/2018   Chronic anemia 06/03/2018   Hypercholesterolemia 06/03/2018   Malnutrition of moderate degree 07/12/2017   Hypoalbuminemia 07/11/2017   Elevated LFTs 07/11/2017   Closed fracture of left distal radius 05/28/2017   Snoring 12/21/2016   OSA and COPD overlap syndrome ([place]) 12/21/2016   Peripheral musculoskeletal gait disorder 02/05/2015   Lumbar post-laminectomy syndrome 02/05/2015   CAD in native artery 10/16/2014   NSTEMI (non-ST elevated myocardial infarction) ([place]) 09/13/2014   Cardiomyopathy, ischemic 98/92/1194   Chronic systolic heart failure ([place])    Tobacco abuse 09/12/2014   Dyslipidemia 09/12/2014   CKD (chronic kidney disease), stage II 09/12/2014   Normocytic anemia 09/12/2014   Chronic diastolic heart failure, NYHA class 1 ([place]) 09/12/2014   Postlaminectomy syndrome, cervical region 08/01/2013   Chronic midline low back pain with left-sided sciatica 08/01/2013   Shoulder joint contracture 08/01/2013   HTN (hypertension) 03/03/2013   Abnormal genetic test 03/03/2013    Conditions to be addressed/monitored:Stage 3a chronic kidney disease, HTN, chronic systolic heart failure, Mild Cognitive Impairment, Atherosclerotic heart disease of native coronary artery with other forms of angina pectoris, Lung Nodule    Care Plan : Cancer Treatment Phase (Adult)  Updates made by Lynne Logan, RN since  08/04/2020 12:00 AM    Problem: Psychosocial Response to Cancer Diagnosis   Priority: High    Long-Range Goal: Optimal Coping   Start Date: 06/15/2020  Expected End Date: 12/13/2020  This Visit's Progress: On track  Recent Progress: On track  Priority: High  Note:   Current Barriers:   Ineffective Self Health Maintenance  Lacks social connections  Cognitive deficits   Currently UNABLE TO independently self manage needs related to chronic health conditions.   Knowledge Deficits related to short term plan for care coordination needs and long term plans for chronic disease management needs Clinical Goal(s):   Collaboration with [name], FNP regarding development and update of comprehensive plan of care as evidenced by provider attestation and co-signature  Inter-disciplinary care team collaboration (see longitudinal plan of care)  Over the next 180 days, patient will work with care management team to address care coordination and chronic disease management needs related to Disease Management  Educational Needs  Care Coordination  Medication Management and Education  Medication Reconciliation  Psychosocial Support  Dementia and Caregiver Support   Interventions:  07/30/20 completed inbound call from  son [name]  Evaluation of current treatment plan related to Dementia and patient's adherence to plan as established by provider.  Collaboration with [name], FNP regarding development and update of comprehensive plan of care as evidenced by provider attestation       and co-signature  Inter-disciplinary care team collaboration (see longitudinal plan of care)  Discussed son [name] is concerned about the change in his mother's behavior following her first radiation treatment completed last week  Determined she is refusing to return to the [facility] for ongoing radiation therapy and is showing increased confusion and combativeness  Determined the change in  routine, fear and stress may be aggravating her Dementia resulting in worsening symptoms   Discussed son [name] contacted the PCP office and to have Ms. Roskelley evaluated by [name], FNP on 08/03/20 to assess for infection or other causes for concerns and recommendations for next steps  Determined [name] has notified the [facility] of his mother's refusal to keep her scheduled Radiation appointment and he will keep them updated of her intent to move forward with Radiation   Discussed plans with patient for ongoing care management follow up and provided patient with direct contact information for care management team Patient Goals/Self Care Activities:  Over the next 180 days, patient will:  Keep all radiation treatment appointments as scheduled Keep all MD follow up appointments as scheduled Call your Oncology navigator for questions related to Cancer diagnosis and next steps Continue to work with PCP and CCM team for disease education and support and or for assistance with care coordination as needed   Follow Up Plan: Telephone follow up appointment with care management team member scheduled for: 08/05/20    Care Plan : Dementia (Adult)  Updates made by Lynne Logan, RN since 08/04/2020 12:00 AM    Problem: Cognitive Function   Priority: Medium    Long-Range Goal: Optimal Cognitive Function   Start Date: 07/26/2020  Expected End Date: 01/26/2021  This Visit's Progress: Not on track  Recent Progress: On track  Priority: Medium  Note:   Current Barriers:   Ineffective Self Health Maintenance  Unable to self administer medications as prescribed  Unable to perform ADLs independently  Unable to perform IADLs independently  Cognitive deficit  Clinical Goal(s):   Collaboration with [name], FNP regarding development and update of comprehensive plan of care as evidenced by provider attestation and co-signature  Inter-disciplinary care team collaboration (see  longitudinal plan of care)  patient will work with care management team to address care coordination and chronic disease management needs related to Disease Management  Educational Needs  Care Coordination  Medication Management and Education  Psychosocial Support  Dementia and Caregiver Support   Interventions:  07/30/20 completed inbound call from son [name]  Evaluation of current treatment plan related to Dementia and patient's adherence to plan as established by provider.  Collaboration with [name], FNP regarding development and update of comprehensive plan of care as evidenced by provider attestation       and co-signature  Inter-disciplinary care team collaboration (see longitudinal plan of care)  Discussed son [name] is concerned about the change in his mother's behavior following her first radiation treatment completed last week  Determined she is refusing to return to the [facility] for ongoing radiation therapy and is showing increased confusion and combativeness  Determined the change in routine, fear and stress may be aggravating her Dementia resulting in worsening symptoms   Discussed son [name] contacted the  PCP office and to have Ms. Bonnette evaluated by [name], FNP on 08/03/20 to assess for infection or other causes for concerns and recommendations for next steps  Discussed plans with patient for ongoing care management follow up and provided patient with direct contact information for care management team Self Care Activities:   Continue to keep all MD follow up appointments for ongoing monitoring of renal fuction  Continue to take all medications exactly as prescribed   Continue to call the pharmacy for refills at least 7 days before taking last dose  Notify the CCM team and or PCP if you are unable to afford your medication refills  Call the CCM team and or PCP for questions or concerns  Patient Goals: - Maintain Optimal Cognitive Function   - Follow up with PCP for evatuation of change in cognitive function and behavior   Follow Up Plan: Telephone follow up appointment with care management team member scheduled for: 08/05/20    Plan:Telephone follow up appointment with care management team member scheduled for:  08/05/20  Barb Merino, RN, BSN, CCM Care Management Coordinator [facility]  Direct Phone: [phone]

## 2020-08-04 NOTE — Telephone Encounter (Signed)
Spoke with patient's son Sara Fox and scheduled an in-person Palliative Consult for 08/26/20 @ 12:30PM  COVID screening was negative. No pets in home. Patient lives with son.  Consent obtained; updated Outlook/Netsmart/Team List and Epic.  Family is aware they may be receiving a call from NP the day before or day of to confirm appointment.

## 2020-08-05 ENCOUNTER — Ambulatory Visit: Payer: Self-pay

## 2020-08-05 ENCOUNTER — Ambulatory Visit: Payer: Medicare Other | Admitting: Sports Medicine

## 2020-08-05 ENCOUNTER — Ambulatory Visit: Payer: Medicare Other | Admitting: Radiation Oncology

## 2020-08-05 ENCOUNTER — Telehealth: Payer: Medicare Other

## 2020-08-05 DIAGNOSIS — I10 Essential (primary) hypertension: Secondary | ICD-10-CM

## 2020-08-05 DIAGNOSIS — I5022 Chronic systolic (congestive) heart failure: Secondary | ICD-10-CM

## 2020-08-05 DIAGNOSIS — N1831 Chronic kidney disease, stage 3a: Secondary | ICD-10-CM

## 2020-08-05 DIAGNOSIS — G3184 Mild cognitive impairment, so stated: Secondary | ICD-10-CM

## 2020-08-05 DIAGNOSIS — R911 Solitary pulmonary nodule: Secondary | ICD-10-CM

## 2020-08-05 DIAGNOSIS — I25118 Atherosclerotic heart disease of native coronary artery with other forms of angina pectoris: Secondary | ICD-10-CM

## 2020-08-05 DIAGNOSIS — IMO0001 Reserved for inherently not codable concepts without codable children: Secondary | ICD-10-CM

## 2020-08-05 NOTE — Chronic Care Management (AMB) (Signed)
Chronic Care Management   CCM RN Visit Note  08/05/2020 Name: Sara Fox MRN: [id number] DOB: 1940/07/18  Subjective: Sara Fox is a 80 y.o. year old female who is a primary care patient of [name], [place]. The care management team was consulted for assistance with disease management and care coordination needs.    Engaged with patient by telephone for follow up visit in response to provider referral for case management and/or care coordination services.   Consent to Services:  The patient was given information about Chronic Care Management services, agreed to services, and gave verbal consent prior to initiation of services.  Please see initial visit note for detailed documentation.   Patient agreed to services and verbal consent obtained.   Assessment: Review of patient past medical history, allergies, medications, health status, including review of consultants reports, laboratory and other test data, was performed as part of comprehensive evaluation and provision of chronic care management services.   SDOH (Social Determinants of Health) assessments and interventions performed:    CCM Care Plan  Allergies  Allergen Reactions  . Buprenorphine Hcl Shortness Of Breath    Throat swelling/trouble breathing and lethargic  . Morphine And Related Shortness Of Breath    Throat swelling/trouble breathing and lethargic  . Celebrex [Celecoxib] Diarrhea and Swelling    Swelling of legs   . Vioxx [Rofecoxib] Palpitations  . Codeine Other (See Comments)    Bloated     Outpatient Encounter Medications as of 08/05/2020  Medication Sig Note  . aspirin EC 81 MG tablet Take 81 mg by mouth daily. (Patient not taking: No sig reported) 05/24/2020: On hold due to upcoming procedure.  . Calcium Citrate-Vitamin D (CALCIUM CITRATE+D3 PETITES PO) Take 1 tablet by mouth daily.   . cholecalciferol (VITAMIN D) 25 MCG (1000 UNIT) tablet Take 1,000 Units by mouth daily.   . ciclopirox (PENLAC) 8  % solution Apply 1 application topically at bedtime. Apply over nail and surrounding skin. Apply daily over previous coat. After seven (7) days, file nail and continue cycle.   . CVS MAGNESIUM OXIDE 250 MG TABS TAKE 1 TABLET BY MOUTH WITH EVENING MEAL DAILY   . donepezil (ARICEPT) 10 MG tablet Take 1 tablet (10 mg total) by mouth at bedtime.   . ferrous sulfate 325 (65 FE) MG tablet Take 325 mg by mouth daily with breakfast.   . furosemide (LASIX) 20 MG tablet TAKE 1 TABLET BY MOUTH EVERY OTHER DAY   . gabapentin (NEURONTIN) 100 MG capsule Take 1 capsule (100 mg total) by mouth 2 (two) times daily. (Patient not taking: No sig reported)   . Magnesium 250 MG TABS Take 250 mg by mouth every evening.   . metoprolol tartrate (LOPRESSOR) 25 MG tablet TAKE 1 TABLET BY MOUTH TWICE A DAY   . Multiple Vitamin (MULTIVITAMIN WITH MINERALS) TABS tablet Take 1 tablet by mouth daily.   . nitroGLYCERIN (NITROSTAT) 0.4 MG SL tablet PLACE ONE TABLET UNDER THE TONGUE EVERY 5 MINUTES AS NEEDED FOR CHEST PAIN. (Patient not taking: No sig reported)   . oxybutynin (DITROPAN) 5 MG tablet TAKE 1 TABLET BY MOUTH TWICE A DAY (Patient taking differently: Take 5 mg by mouth 2 (two) times daily.)   . pantoprazole (PROTONIX) 40 MG tablet Take 1 tablet (40 mg total) by mouth 2 (two) times daily.   . polyethylene glycol (MIRALAX / GLYCOLAX) packet Take 17 g by mouth daily as needed for mild constipation.  (Patient not taking: No sig reported)   .  Potassium Chloride ER 20 MEQ TBCR TAKE 2 TABLETS BY MOUTH EVERY MORNING (Patient taking differently: Take 40 mEq by mouth daily.)   . QUEtiapine (SEROQUEL) 25 MG tablet Take 1 tablet (25 mg total) by mouth at bedtime.   . rosuvastatin (CRESTOR) 40 MG tablet Take 1 tablet (40 mg total) by mouth every evening.   . SYMBICORT 160-4.5 MCG/ACT inhaler TAKE 2 PUFFS BY MOUTH TWICE A DAY   . traMADol (ULTRAM) 50 MG tablet TAKE 1 TABLET 3 TIMES A DAY AND CAN TAKE 1 EXTRA 20 DAYS/MONTH 07/05/2020:  Last filled on 06/25/2020 for # 110  On hand today # 54 plus some at home per Son Last taken 07/04/2020   No facility-administered encounter medications on file as of 08/05/2020.    Patient Active Problem List   Diagnosis Date Noted  . Non-small cell carcinoma of right lung, stage 1 ([place]) 06/08/2020  . Pulmonary nodule 1 cm or greater in diameter 05/21/2020  . Atherosclerotic heart disease of native coronary artery with other forms of angina pectoris ([place]) 04/22/2020  . Fatigue 02/18/2019  . Memory loss 02/18/2019  . Vitamin B12 deficiency 10/23/2018  . Hypotension 10/23/2018  . Stage 3 chronic kidney disease ([place]) 06/03/2018  . Chronic anemia 06/03/2018  . Hypercholesterolemia 06/03/2018  . Malnutrition of moderate degree 07/12/2017  . Hypoalbuminemia 07/11/2017  . Elevated LFTs 07/11/2017  . Closed fracture of left distal radius 05/28/2017  . Snoring 12/21/2016  . OSA and COPD overlap syndrome ([place]) 12/21/2016  . Peripheral musculoskeletal gait disorder 02/05/2015  . Lumbar post-laminectomy syndrome 02/05/2015  . CAD in native artery 10/16/2014  . NSTEMI (non-ST elevated myocardial infarction) ([name]) 09/13/2014  . Cardiomyopathy, ischemic 09/13/2014  . Chronic systolic heart failure ([place])   . Tobacco abuse 09/12/2014  . Dyslipidemia 09/12/2014  . CKD (chronic kidney disease), stage II 09/12/2014  . Normocytic anemia 09/12/2014  . Chronic diastolic heart failure, NYHA class 1 ([place]) 09/12/2014  . Postlaminectomy syndrome, cervical region 08/01/2013  . Chronic midline low back pain with left-sided sciatica 08/01/2013  . Shoulder joint contracture 08/01/2013  . HTN (hypertension) 03/03/2013  . Abnormal genetic test 03/03/2013    Conditions to be addressed/monitored:Stage 3a chronic kidney disease, HTN, chronic systolic heart failure, Mild Cognitive Impairment, Atherosclerotic heart disease of native coronary artery with other forms of angina pectoris, Lung Nodule    Care Plan :  Dementia (Adult)  Updates made by Lynne Logan, RN since 08/05/2020 12:00 AM    Problem: Cognitive Function   Priority: Medium    Long-Range Goal: Optimal Cognitive Function   Start Date: 07/26/2020  Expected End Date: 01/26/2021  This Visit's Progress: On track  Recent Progress: Not on track  Priority: Medium  Note:   Current Barriers:   Ineffective Self Health Maintenance  Unable to self administer medications as prescribed  Unable to perform ADLs independently  Unable to perform IADLs independently  Cognitive deficit  Clinical Goal(s):  Marland Kitchen Collaboration with [name], FNP regarding development and update of comprehensive plan of care as evidenced by provider attestation and co-signature . Inter-disciplinary care team collaboration (see longitudinal plan of care)  patient will work with care management team to address care coordination and chronic disease management needs related to Disease Management  Educational Needs  Care Coordination  Medication Management and Education  Psychosocial Support  Dementia and Caregiver Support   Interventions:  08/05/20 completed successful outbound call with son [name]  Evaluation of current treatment plan related to Dementia and patient's  adherence to plan as established by provider.  Collaboration with [name], FNP regarding development and update of comprehensive plan of care as evidenced by provider attestation       and co-signature  Inter-disciplinary care team collaboration (see longitudinal plan of care)  Discussed and reviewed recent f/u with PCP for evaluation or patient's worsening dementia with behavior changes  Discussed a Palliative Care referral was sent with initial home NP visit scheduled for 08/26/20  Reviewed and discussed medications prescribed during PCP visit and [name] will start them today due to just receiving from pharmacy  Discussed son [name] will continue to follow PCP recommendations and  he will attempt to get patient to keep all scheduled appointments including her Radiation treatments, however, if she refuses, he will respect her wishes   Encouraged [name] to call the CCM team and or PCP for new or worsening symptoms, questions or concerns   Discussed plans with patient for ongoing care management follow up and provided patient with direct contact information for care management team Self Care Activities:  . Continue to keep all MD follow up appointments  . Continue to take all medications exactly as prescribed  . Continue to call the pharmacy for refills at least 7 days before taking last dose . Notify the CCM team and or PCP if you are unable to afford your medication refills . Call the CCM team and or PCP for questions or concerns  Patient Goals: - Maintain Optimal Cognitive Function   Follow Up Plan: Telephone follow up appointment with care management team member scheduled for: 08/31/20     Plan:Telephone follow up appointment with care management team member scheduled for:  08/31/20  Barb Merino, RN, BSN, CCM Care Management Coordinator [place] [facility]  Direct Phone: [phone]

## 2020-08-05 NOTE — Patient Instructions (Signed)
Goals Addressed    . Optimal Cognitive Function       Timeframe:  Long-Range Goal Priority:  Medium Start Date: 07/26/20                             Expected End Date: 01/26/21                     Next Follow Up date: 08/31/20  Patient Goals/Self-Care Activities:   - Maintain Optimal Cognitive Function  - Follow up with PCP for evatuation of change in cognitive function and behavior

## 2020-08-06 ENCOUNTER — Ambulatory Visit
Admission: RE | Admit: 2020-08-06 | Discharge: 2020-08-06 | Disposition: A | Discharge status: Home / Self Care | Payer: Medicare Other | Source: Ambulatory Visit | Attending: Radiation Oncology | Admitting: Radiation Oncology

## 2020-08-06 ENCOUNTER — Other Ambulatory Visit: Payer: Self-pay

## 2020-08-06 DIAGNOSIS — Z510 Encounter for antineoplastic radiation therapy: Secondary | ICD-10-CM | POA: Diagnosis not present

## 2020-08-08 ENCOUNTER — Encounter: Payer: Self-pay | Admitting: Nurse Practitioner

## 2020-08-09 ENCOUNTER — Telehealth: Payer: Self-pay | Admitting: Radiation Oncology

## 2020-08-09 ENCOUNTER — Emergency Department (HOSPITAL_COMMUNITY)
Admission: EM | Admit: 2020-08-09 | Discharge: 2020-08-09 | Disposition: A | Discharge status: Home / Self Care | Payer: Medicare Other | Attending: Emergency Medicine | Admitting: Emergency Medicine

## 2020-08-09 ENCOUNTER — Telehealth: Payer: Self-pay | Admitting: Nurse Practitioner

## 2020-08-09 ENCOUNTER — Ambulatory Visit: Payer: Medicare Other

## 2020-08-09 ENCOUNTER — Ambulatory Visit: Payer: Medicare Other | Admitting: Radiation Oncology

## 2020-08-09 DIAGNOSIS — I5022 Chronic systolic (congestive) heart failure: Secondary | ICD-10-CM

## 2020-08-09 DIAGNOSIS — I25118 Atherosclerotic heart disease of native coronary artery with other forms of angina pectoris: Secondary | ICD-10-CM | POA: Insufficient documentation

## 2020-08-09 DIAGNOSIS — F0390 Unspecified dementia without behavioral disturbance: Secondary | ICD-10-CM | POA: Diagnosis not present

## 2020-08-09 DIAGNOSIS — I130 Hypertensive heart and chronic kidney disease with heart failure and stage 1 through stage 4 chronic kidney disease, or unspecified chronic kidney disease: Secondary | ICD-10-CM | POA: Insufficient documentation

## 2020-08-09 DIAGNOSIS — Z8541 Personal history of malignant neoplasm of cervix uteri: Secondary | ICD-10-CM | POA: Diagnosis not present

## 2020-08-09 DIAGNOSIS — I5032 Chronic diastolic (congestive) heart failure: Secondary | ICD-10-CM | POA: Diagnosis not present

## 2020-08-09 DIAGNOSIS — F17200 Nicotine dependence, unspecified, uncomplicated: Secondary | ICD-10-CM | POA: Insufficient documentation

## 2020-08-09 DIAGNOSIS — I10 Essential (primary) hypertension: Secondary | ICD-10-CM | POA: Diagnosis not present

## 2020-08-09 DIAGNOSIS — N1831 Chronic kidney disease, stage 3a: Secondary | ICD-10-CM | POA: Diagnosis not present

## 2020-08-09 DIAGNOSIS — Z7982 Long term (current) use of aspirin: Secondary | ICD-10-CM | POA: Diagnosis not present

## 2020-08-09 DIAGNOSIS — R451 Restlessness and agitation: Secondary | ICD-10-CM

## 2020-08-09 DIAGNOSIS — J449 Chronic obstructive pulmonary disease, unspecified: Secondary | ICD-10-CM | POA: Insufficient documentation

## 2020-08-09 DIAGNOSIS — R454 Irritability and anger: Secondary | ICD-10-CM | POA: Diagnosis present

## 2020-08-09 DIAGNOSIS — Z79899 Other long term (current) drug therapy: Secondary | ICD-10-CM | POA: Diagnosis not present

## 2020-08-09 DIAGNOSIS — N183 Chronic kidney disease, stage 3 unspecified: Secondary | ICD-10-CM | POA: Diagnosis not present

## 2020-08-09 DIAGNOSIS — G3184 Mild cognitive impairment, so stated: Secondary | ICD-10-CM

## 2020-08-09 NOTE — Telephone Encounter (Signed)
Received phone call from daughter [name] in tears informing me her mother does not want to take a bath and is being agitated. She verbalizes the family is interested in placing her mother in memory care unit or facility.  I explained to the daughter it is a process but if her mother is that agitated to call EMS to have her evaluated and she may need to go to the ER for a workup.  The patient is also not wanting to go for her treatment today, will send message to Dr. Sondra Come.  I have also contacted [name] SW to assist with possible placement.   Daughter left her phone number [phone] [name] and her granddaughter Delana Meyer [phone] who is going to check on her.  The daughter is unable to care for her due to her being ill.

## 2020-08-09 NOTE — ED Notes (Signed)
Pt eating lunch now

## 2020-08-09 NOTE — Telephone Encounter (Signed)
Rec'd a voicemail just now that patient cannot come today for Radiation treatment. I have called [facility] one to let Kayla know. She will reach out to patient to reschedule.

## 2020-08-09 NOTE — ED Triage Notes (Signed)
EMS brings pt in from home. Pt has no complaints. Family wanted pt evaluated for a spot on her lung found by PCP.

## 2020-08-09 NOTE — Discharge Instructions (Addendum)
Follow-up with your doctor as we discussed

## 2020-08-09 NOTE — ED Provider Notes (Signed)
[facility] DEPT Provider Note   CSN: [id number] Arrival date & time: 08/09/20  1054     History Chief Complaint  Patient presents with  . Mass    Sara Fox is a 80 y.o. female.  80 year old female with history of likely early dementia presents with an increased anger at home.  Patient states that she got an argument with her son over a possible diagnosis of dementia.  Review of the patient's medical record shows that her family called her PCP who instructed them to bring the patient to the ER.  I spoke with the patient's PCP and they state that patient saw the patient last week and she had a negative urinalysis at that time.  There has been some discussion about placement for this patient due to some early dementia.  The patient herself denies any complaints at this time.  Patient most recently started on Seroquel to help with the symptoms        Past Medical History:  Diagnosis Date  . Acute kidney injury ([place])   . Back pain   . Cervical cancer ([place])   . CHF (congestive heart failure) ([place])   . COPD (chronic obstructive pulmonary disease) ([place])   . Coronary artery disease    s/p DES x2 to RCA 2016  . GERD (gastroesophageal reflux disease)   . Hyperlipidemia   . Hypertension   . Mild cognitive impairment   . Myocardial infarction ([place])   . Neck pain   . Rhabdomyolysis   . Stomach problems     Patient Active Problem List   Diagnosis Date Noted  . Non-small cell carcinoma of right lung, stage 1 ([place]) 06/08/2020  . Pulmonary nodule 1 cm or greater in diameter 05/21/2020  . Atherosclerotic heart disease of native coronary artery with other forms of angina pectoris ([place]) 04/22/2020  . Fatigue 02/18/2019  . Memory loss 02/18/2019  . Vitamin B12 deficiency 10/23/2018  . Hypotension 10/23/2018  . Stage 3 chronic kidney disease ([place]) 06/03/2018  . Chronic anemia 06/03/2018  . Hypercholesterolemia 06/03/2018  . Malnutrition of moderate  degree 07/12/2017  . Hypoalbuminemia 07/11/2017  . Elevated LFTs 07/11/2017  . Closed fracture of left distal radius 05/28/2017  . Snoring 12/21/2016  . OSA and COPD overlap syndrome ([place]) 12/21/2016  . Peripheral musculoskeletal gait disorder 02/05/2015  . Lumbar post-laminectomy syndrome 02/05/2015  . CAD in native artery 10/16/2014  . NSTEMI (non-ST elevated myocardial infarction) ([place]) 09/13/2014  . Cardiomyopathy, ischemic 09/13/2014  . Chronic systolic heart failure ([place])   . Tobacco abuse 09/12/2014  . Dyslipidemia 09/12/2014  . CKD (chronic kidney disease), stage II 09/12/2014  . Normocytic anemia 09/12/2014  . Chronic diastolic heart failure, NYHA class 1 ([place]) 09/12/2014  . Postlaminectomy syndrome, cervical region 08/01/2013  . Chronic midline low back pain with left-sided sciatica 08/01/2013  . Shoulder joint contracture 08/01/2013  . HTN (hypertension) 03/03/2013  . Abnormal genetic test 03/03/2013    Past Surgical History:  Procedure Laterality Date  . ABDOMINAL HYSTERECTOMY    . BACK SURGERY  2012   lower back  . BRONCHIAL BIOPSY  05/25/2020   Procedure: BRONCHIAL BIOPSIES;  Surgeon: Collene Gobble, MD;  Location: [facility] ENDOSCOPY;  Service: Pulmonary;;  . BRONCHIAL BRUSHINGS  05/25/2020   Procedure: BRONCHIAL BRUSHINGS;  Surgeon: Collene Gobble, MD;  Location: [facility] ENDOSCOPY;  Service: Pulmonary;;  . BRONCHIAL NEEDLE ASPIRATION BIOPSY  05/25/2020   Procedure: BRONCHIAL NEEDLE ASPIRATION BIOPSIES;  Surgeon: Collene Gobble,  MD;  Location: [facility] ENDOSCOPY;  Service: Pulmonary;;  . CARDIAC CATHETERIZATION  06/1999   noncritical disease invovling PDA  . CARDIAC CATHETERIZATION N/A 09/14/2014   Procedure: Left Heart Cath and Coronary Angiography;  Surgeon: Leonie Man, MD;  Location: [facility] INVASIVE CV LAB CUPID;  Service: Cardiovascular;  Laterality: N/A;  . CHOLECYSTECTOMY    . ESOPHAGOGASTRODUODENOSCOPY (EGD) WITH PROPOFOL Left 07/19/2017   Procedure:  ESOPHAGOGASTRODUODENOSCOPY (EGD) WITH PROPOFOL;  Surgeon: Ronnette Juniper, MD;  Location: [facility] ENDOSCOPY;  Service: Gastroenterology;  Laterality: Left;  . FINE NEEDLE ASPIRATION  05/25/2020   Procedure: FINE NEEDLE ASPIRATION (FNA) LINEAR;  Surgeon: Collene Gobble, MD;  Location: [facility] ENDOSCOPY;  Service: Pulmonary;;  . FRACTURE SURGERY Left 05/2017   left wrist  . HARDWARE REMOVAL Left 11/07/2017   Procedure: LEFT WRIST HARDWARE REMOVAL;  Surgeon: Charlotte Crumb, MD;  Location: [facility];  Service: Orthopedics;  Laterality: Left;  . KNEE SURGERY Bilateral 2001 & 2007  . NECK SURGERY  2012   2012  . PARTIAL GASTRECTOMY  2005   subtotal  . PERCUTANEOUS CORONARY STENT INTERVENTION (PCI-S)  09/14/2014   Procedure: Percutaneous Coronary Stent Intervention (Pci-S);  Surgeon: Leonie Man, MD;  Location: [facility] INVASIVE CV LAB CUPID;  Service: Cardiovascular;;  . SHOULDER SURGERY Right   . TRANSTHORACIC ECHOCARDIOGRAM  06/02/2010   EF=>55%, normal LV systolic function; normal RV systolic function; mild mitral annular calcif; trace TR; AV mildly sclerotic  . VIDEO BRONCHOSCOPY WITH ENDOBRONCHIAL NAVIGATION N/A 05/25/2020   Procedure: VIDEO BRONCHOSCOPY WITH ENDOBRONCHIAL NAVIGATION;  Surgeon: Collene Gobble, MD;  Location: [facility] ENDOSCOPY;  Service: Pulmonary;  Laterality: N/A;  . VIDEO BRONCHOSCOPY WITH ENDOBRONCHIAL ULTRASOUND N/A 05/25/2020   Procedure: VIDEO BRONCHOSCOPY WITH ENDOBRONCHIAL ULTRASOUND;  Surgeon: Collene Gobble, MD;  Location: [facility] ENDOSCOPY;  Service: Pulmonary;  Laterality: N/A;  . WRIST OSTEOTOMY Left 11/07/2017   Procedure: LEFT WRIST DISTAL ULNA RESECTION WITH EXTENSOR CARPI ULNARIS STABILIZATION;  Surgeon: Charlotte Crumb, MD;  Location: [facility];  Service: Orthopedics;  Laterality: Left;     OB History   No obstetric history on file.     Family History  Problem Relation Age of Onset  . Heart disease Mother        also HTN  . Diabetes Mother   . Colon cancer Mother   . Heart disease  Brother        deceased at 38  . Hypertension Brother   . Heart attack Brother   . Heart disease Brother   . Hypertension Brother     Social History   Tobacco Use  . Smoking status: Current Every Day Smoker    Packs/day: 0.50    Years: 60.00    Pack years: 30.00  . Smokeless tobacco: Never Used  . Tobacco comment: currently smokes 1/2 ppd or less  Vaping Use  . Vaping Use: Former  Substance Use Topics  . Alcohol use: No  . Drug use: No    Home Medications Prior to Admission medications   Medication Sig Start Date End Date Taking? Authorizing Provider  aspirin EC 81 MG tablet Take 81 mg by mouth daily. Patient not taking: No sig reported    [provider]  Calcium Citrate-Vitamin D (CALCIUM CITRATE+D3 PETITES PO) Take 1 tablet by mouth daily.    [provider]  cholecalciferol (VITAMIN D) 25 MCG (1000 UNIT) tablet Take 1,000 Units by mouth daily.    [provider]  ciclopirox (PENLAC) 8 % solution Apply 1 application  topically at bedtime. Apply over nail and surrounding skin. Apply daily over previous coat. After seven (7) days, file nail and continue cycle. 05/25/20   Collene Gobble, MD  CVS MAGNESIUM OXIDE 250 MG TABS TAKE 1 TABLET BY MOUTH WITH EVENING MEAL DAILY 07/19/20   [name], FNP  donepezil (ARICEPT) 10 MG tablet Take 1 tablet (10 mg total) by mouth at bedtime. 08/03/20   [name], FNP  ferrous sulfate 325 (65 FE) MG tablet Take 325 mg by mouth daily with breakfast.    [provider]  furosemide (LASIX) 20 MG tablet TAKE 1 TABLET BY MOUTH EVERY OTHER DAY 07/26/20   [name], FNP  gabapentin (NEURONTIN) 100 MG capsule Take 1 capsule (100 mg total) by mouth 2 (two) times daily. Patient not taking: No sig reported 05/25/20   Collene Gobble, MD  Magnesium 250 MG TABS Take 250 mg by mouth every evening.    [provider]  metoprolol tartrate (LOPRESSOR) 25 MG tablet TAKE 1 TABLET BY MOUTH TWICE A DAY 06/16/20    Hilty, Nadean Corwin, MD  Multiple Vitamin (MULTIVITAMIN WITH MINERALS) TABS tablet Take 1 tablet by mouth daily.    [provider]  nitroGLYCERIN (NITROSTAT) 0.4 MG SL tablet PLACE ONE TABLET UNDER THE TONGUE EVERY 5 MINUTES AS NEEDED FOR CHEST PAIN. Patient not taking: No sig reported 08/11/19   Pixie Casino, MD  oxybutynin (DITROPAN) 5 MG tablet TAKE 1 TABLET BY MOUTH TWICE A DAY Patient taking differently: Take 5 mg by mouth 2 (two) times daily. 02/23/20   [name], FNP  pantoprazole (PROTONIX) 40 MG tablet Take 1 tablet (40 mg total) by mouth 2 (two) times daily. 07/26/20   [name], FNP  polyethylene glycol [facility] / Floria Raveling) packet Take 17 g by mouth daily as needed for mild constipation.  Patient not taking: No sig reported    [provider]  Potassium Chloride ER 20 MEQ TBCR TAKE 2 TABLETS BY MOUTH EVERY MORNING Patient taking differently: Take 40 mEq by mouth daily. 01/07/20   Hilty, Nadean Corwin, MD  QUEtiapine (SEROQUEL) 25 MG tablet Take 1 tablet (25 mg total) by mouth at bedtime. 08/03/20   [name], FNP  rosuvastatin (CRESTOR) 40 MG tablet Take 1 tablet (40 mg total) by mouth every evening. 05/25/20   Byrum, Rose Fillers, MD  SYMBICORT 160-4.5 MCG/ACT inhaler TAKE 2 PUFFS BY MOUTH TWICE A DAY 07/26/20   [name], FNP  traMADol (ULTRAM) 50 MG tablet TAKE 1 TABLET 3 TIMES A DAY AND CAN TAKE 1 EXTRA 20 DAYS/MONTH 05/28/20   Charlett Blake, MD    Allergies    Buprenorphine hcl, Morphine and related, Celebrex [celecoxib], Vioxx [rofecoxib], and Codeine  Review of Systems   Review of Systems  All other systems reviewed and are negative.   Physical Exam Updated Vital Signs BP 109/65   Pulse 72   Temp 98.9 F (37.2 C) (Oral)   Resp 18   Physical Exam Vitals and nursing note reviewed.  Constitutional:      General: She is not in acute distress.    Appearance: Normal appearance. She is well-developed. She is not toxic-appearing.  HENT:      Head: Normocephalic and atraumatic.  Eyes:     General: Lids are normal.     Conjunctiva/sclera: Conjunctivae normal.     Pupils: Pupils are equal, round, and reactive to light.  Neck:     Thyroid: No thyroid mass.  Trachea: No tracheal deviation.  Cardiovascular:     Rate and Rhythm: Normal rate and regular rhythm.     Heart sounds: Normal heart sounds. No murmur heard. No gallop.   Pulmonary:     Effort: Pulmonary effort is normal. No respiratory distress.     Breath sounds: Normal breath sounds. No stridor. No decreased breath sounds, wheezing, rhonchi or rales.  Abdominal:     General: Bowel sounds are normal. There is no distension.     Palpations: Abdomen is soft.     Tenderness: There is no abdominal tenderness. There is no rebound.  Musculoskeletal:        General: No tenderness. Normal range of motion.     Cervical back: Normal range of motion and neck supple.  Skin:    General: Skin is warm and dry.     Findings: No abrasion or rash.  Neurological:     Mental Status: She is alert and oriented to person, place, and time.     GCS: GCS eye subscore is 4. GCS verbal subscore is 5. GCS motor subscore is 6.     Cranial Nerves: No cranial nerve deficit.     Sensory: No sensory deficit.  Psychiatric:        Attention and Perception: Attention normal.        Mood and Affect: Mood normal.        Speech: Speech normal.        Behavior: Behavior normal.     ED Results / Procedures / Treatments   Labs (all labs ordered are listed, but only abnormal results are displayed) Labs Reviewed - No data to display  EKG None  Radiology No results found.  Procedures Procedures   Medications Ordered in ED Medications - No data to display  ED Course  I have reviewed the triage vital signs and the nursing notes.  Pertinent labs & imaging results that were available during my care of the patient were reviewed by me and considered in my medical decision making (see chart for  details).    MDM Rules/Calculators/A&P                          Had a discussion with patient's primary care provider, Ms. Laurance Flatten, Education officer, museum is involved from their office, [facility], and is trying to have patient placed in facility.  Patient has no acute medical conditions at this time.  Her vital signs are stable.  Will speak with patient's daughter and likely send back home Final Clinical Impression(s) / ED Diagnoses Final diagnoses:  None    Rx / DC Orders ED Discharge Orders    None       Lacretia Leigh, MD 08/09/20 1128

## 2020-08-10 NOTE — Patient Instructions (Signed)
Social Worker Visit Information  Goals we discussed today:  Goals Addressed            This Visit's Progress   . Work with SW to identify and manage barriers to treatment       Timeframe:  Long-Range Goal Priority:  Low Start Date:  1.18.22                           Expected End Date: 5.18.22                       Next planned outreach date: 4.12.22  Patient Goals/Self-Care Activities . Over the next 30 days, patient will: with the help of her son  - Patient will self administer medications as prescribed Patient will attend all scheduled provider appointments Patient will call provider office for new concerns or questions Contact SW as needed prior to next scheduled call Engage with Palliative Care services to address goals of care        Materials Provided: Verbal education about long term care Medicaid provided by phone  Follow Up Plan: SW will follow up with patient by phone over the next month.   [name], BSW, CDP Social Worker, Certified Dementia Practitioner [place] / [facility] [phone]

## 2020-08-10 NOTE — Chronic Care Management (AMB) (Signed)
Chronic Care Management    Social Work Note  08/10/2020 Name: Sara Fox MRN: [id number] DOB: 1941/01/27  Sara Fox is a 80 y.o. year old female who is a primary care patient of [name], [place]. The CCM team was consulted to assist the patient with chronic disease management and/or care coordination needs related to: Level of Care Concerns.   Engaged with patients daughter, [name] by phone for follow up visit in response to provider referral for social work chronic care management and care coordination services.   Consent to Services:  The patient was given information about Chronic Care Management services, agreed to services, and gave verbal consent prior to initiation of services.  Please see initial visit note for detailed documentation.   Patient agreed to services and consent obtained.   Assessment: Review of patient past medical history, allergies, medications, and health status, including review of relevant consultants reports was performed today as part of a comprehensive evaluation and provision of chronic care management and care coordination services.     SDOH (Social Determinants of Health) assessments and interventions performed:    Advanced Directives Status: Not addressed in this encounter.  CCM Care Plan  Allergies  Allergen Reactions  . Buprenorphine Hcl Shortness Of Breath    Throat swelling/trouble breathing and lethargic  . Morphine And Related Shortness Of Breath    Throat swelling/trouble breathing and lethargic  . Celebrex [Celecoxib] Diarrhea and Swelling    Swelling of legs   . Vioxx [Rofecoxib] Palpitations  . Codeine Other (See Comments)    Bloated     Outpatient Encounter Medications as of 08/09/2020  Medication Sig Note  . aspirin EC 81 MG tablet Take 81 mg by mouth daily. (Patient not taking: No sig reported) 05/24/2020: On hold due to upcoming procedure.  . Calcium Citrate-Vitamin D (CALCIUM CITRATE+D3 PETITES PO) Take 1 tablet by mouth  daily.   . cholecalciferol (VITAMIN D) 25 MCG (1000 UNIT) tablet Take 1,000 Units by mouth daily.   . ciclopirox (PENLAC) 8 % solution Apply 1 application topically at bedtime. Apply over nail and surrounding skin. Apply daily over previous coat. After seven (7) days, file nail and continue cycle.   . CVS MAGNESIUM OXIDE 250 MG TABS TAKE 1 TABLET BY MOUTH WITH EVENING MEAL DAILY   . donepezil (ARICEPT) 10 MG tablet Take 1 tablet (10 mg total) by mouth at bedtime.   . ferrous sulfate 325 (65 FE) MG tablet Take 325 mg by mouth daily with breakfast.   . furosemide (LASIX) 20 MG tablet TAKE 1 TABLET BY MOUTH EVERY OTHER DAY   . gabapentin (NEURONTIN) 100 MG capsule Take 1 capsule (100 mg total) by mouth 2 (two) times daily. (Patient not taking: No sig reported)   . Magnesium 250 MG TABS Take 250 mg by mouth every evening.   . metoprolol tartrate (LOPRESSOR) 25 MG tablet TAKE 1 TABLET BY MOUTH TWICE A DAY   . Multiple Vitamin (MULTIVITAMIN WITH MINERALS) TABS tablet Take 1 tablet by mouth daily.   . nitroGLYCERIN (NITROSTAT) 0.4 MG SL tablet PLACE ONE TABLET UNDER THE TONGUE EVERY 5 MINUTES AS NEEDED FOR CHEST PAIN. (Patient not taking: No sig reported)   . oxybutynin (DITROPAN) 5 MG tablet TAKE 1 TABLET BY MOUTH TWICE A DAY (Patient taking differently: Take 5 mg by mouth 2 (two) times daily.)   . pantoprazole (PROTONIX) 40 MG tablet Take 1 tablet (40 mg total) by mouth 2 (two) times daily.   . polyethylene  glycol (MIRALAX / GLYCOLAX) packet Take 17 g by mouth daily as needed for mild constipation.  (Patient not taking: No sig reported)   . Potassium Chloride ER 20 MEQ TBCR TAKE 2 TABLETS BY MOUTH EVERY MORNING (Patient taking differently: Take 40 mEq by mouth daily.)   . QUEtiapine (SEROQUEL) 25 MG tablet Take 1 tablet (25 mg total) by mouth at bedtime.   . rosuvastatin (CRESTOR) 40 MG tablet Take 1 tablet (40 mg total) by mouth every evening.   . SYMBICORT 160-4.5 MCG/ACT inhaler TAKE 2 PUFFS BY MOUTH  TWICE A DAY   . traMADol (ULTRAM) 50 MG tablet TAKE 1 TABLET 3 TIMES A DAY AND CAN TAKE 1 EXTRA 20 DAYS/MONTH 07/05/2020: Last filled on 06/25/2020 for # 110  On hand today # 54 plus some at home per Son Last taken 07/04/2020   No facility-administered encounter medications on file as of 08/09/2020.    Patient Active Problem List   Diagnosis Date Noted  . Non-small cell carcinoma of right lung, stage 1 ([place]) 06/08/2020  . Pulmonary nodule 1 cm or greater in diameter 05/21/2020  . Atherosclerotic heart disease of native coronary artery with other forms of angina pectoris ([place]) 04/22/2020  . Fatigue 02/18/2019  . Memory loss 02/18/2019  . Vitamin B12 deficiency 10/23/2018  . Hypotension 10/23/2018  . Stage 3 chronic kidney disease ([place]) 06/03/2018  . Chronic anemia 06/03/2018  . Hypercholesterolemia 06/03/2018  . Malnutrition of moderate degree 07/12/2017  . Hypoalbuminemia 07/11/2017  . Elevated LFTs 07/11/2017  . Closed fracture of left distal radius 05/28/2017  . Snoring 12/21/2016  . OSA and COPD overlap syndrome ([place]) 12/21/2016  . Peripheral musculoskeletal gait disorder 02/05/2015  . Lumbar post-laminectomy syndrome 02/05/2015  . CAD in native artery 10/16/2014  . NSTEMI (non-ST elevated myocardial infarction) ([place]) 09/13/2014  . Cardiomyopathy, ischemic 09/13/2014  . Chronic systolic heart failure ([place])   . Tobacco abuse 09/12/2014  . Dyslipidemia 09/12/2014  . CKD (chronic kidney disease), stage II 09/12/2014  . Normocytic anemia 09/12/2014  . Chronic diastolic heart failure, NYHA class 1 ([place]) 09/12/2014  . Postlaminectomy syndrome, cervical region 08/01/2013  . Chronic midline low back pain with left-sided sciatica 08/01/2013  . Shoulder joint contracture 08/01/2013  . HTN (hypertension) 03/03/2013  . Abnormal genetic test 03/03/2013    Conditions to be addressed/monitored: CHF, HTN and CKD Stage III; Level of care concerns  Care Plan : Social Work [place]   Updates made by [name] since 08/10/2020 12:00 AM    Problem: Barriers to Treatment     Goal: Barriers to Treatment Identified and Managed   Start Date: 06/01/2020  Expected End Date: 09/29/2020  Recent Progress: On track  Priority: Low  Note:   Current Barriers:  . Chronic disease management support and education needs related to CHF, HTN, CKD Stage III, and Cognitive Impairment   . ADL IADL limitations . Limited access to caregiver . Memory Deficits  Social Worker Clinical Goal(s):  Marland Kitchen Over the next 120 days, patient will work with SW to identify and address any acute and/or chronic care coordination needs related to the self health management of CHF, HTN, CKD Stage III, and Cognitive Impairment    CCM SW Interventions:  Completed 3.28.22 . Inter-disciplinary care team collaboration (see longitudinal plan of care) . Collaboration with [name], FNP regarding development and update of comprehensive plan of care as evidenced by provider attestation and co-signature . Inbound call received from patients primary care provider, Doreene Burke  Laurance Flatten FNP to discuss concerns expressed by patients daughter, [name] regarding the need for placement due to patients increased agitation and difficulty getting the patient to radiation treatment . Successful outbound call placed to the patients daughter, Tilda Burrow to assess for resource needs . Determined the patient is currently at [facility] being evaluated for increased agitation - [name] reports the patient refused to attend radiation treatment this morning and she would like placement to make sure the patient attends treatments as scheduled . Advised [name] to request a SW consult while the patient is in the ED . Provided education on the process of placement including the families need to complete a long term care Medicaid application due to the patients inability to privately pay . Discussed [name] does not want the patient  placed long-term just until she is able to move the patient in with her as she feels her brother is not managing patient health needs appropriately . Provided Mrs. Beard with the contact number to DSS to apply for long term care Medicaid . Collaboration with RN Care Manager who indicates she spoke with the patients son last week who is actively involved in patients treatment plan. He did express the patient was not wanting to complete radiation therapy and he was not going to force her to undergo treatment if she did not want to . Collaboration with [name] FNP to discuss SW plans to provide resources as needed while the family works with [place] to address goals of care  Patient Goals/Self-Care Activities . Over the next 30 days, patient will: with the help of her son  - Patient will self administer medications as prescribed Patient will attend all scheduled provider appointments Patient will call provider office for new concerns or questions Contact SW as needed prior to next scheduled call Engage with Palliative Care services to address goals of care  Follow Up Plan:  SW will follow up with the patient over the next month       Follow Up Plan: SW will follow up with patient by phone over the next month.      [name], BSW, CDP Social Worker, Certified Dementia Practitioner [place] [facility] [place] [facility] [phone]  Total time spent performing care coordination and/or care management activities with the patient by phone or face to face = 40 minutes.

## 2020-08-11 ENCOUNTER — Ambulatory Visit: Payer: Medicare Other | Admitting: Radiation Oncology

## 2020-08-13 ENCOUNTER — Ambulatory Visit
Admission: RE | Admit: 2020-08-13 | Discharge: 2020-08-13 | Disposition: A | Discharge status: Home / Self Care | Payer: Medicare Other | Source: Ambulatory Visit | Attending: Radiation Oncology | Admitting: Radiation Oncology

## 2020-08-13 ENCOUNTER — Other Ambulatory Visit: Payer: Self-pay | Admitting: Radiation Oncology

## 2020-08-13 ENCOUNTER — Encounter: Payer: Self-pay | Admitting: General Practice

## 2020-08-13 ENCOUNTER — Other Ambulatory Visit: Payer: Self-pay

## 2020-08-13 ENCOUNTER — Telehealth: Payer: Self-pay

## 2020-08-13 DIAGNOSIS — C3491 Malignant neoplasm of unspecified part of right bronchus or lung: Secondary | ICD-10-CM

## 2020-08-13 NOTE — Progress Notes (Signed)
[place] CSW Progress Notes  Referral received from radiation oncology.  Patient had treatment this morning, son voices needing significantly more help at home managing patient's behaviors which are reported to be very challenging.  Patient lives w son who has accompanied her to appointments.  Per nursing staff, son appears to be overwhelmed by mother's care needs. He states that he does not feel he is able to get her to her radiation appointments as she is resistant to this.  Reviewed chart, patient's PCP has been working with family - called K Humble, SW, who has had contact w son, daughter and patient over the past year.  She has attempted to connect patient with [facility] (non residential SNF) - patient was assessed for this option and declined to enroll.  Daughter has been given information on long term Medicaid application process - this is needed if family desires SNF placement.  CSW Peggye Ley will contact son next week to review options and provide any available resources.  At this time, there is no placement process in motion as patient/family have not been in agreement with this.    Edwyna Shell, LCSW Clinical Social Worker Phone:  [phone]

## 2020-08-13 NOTE — Telephone Encounter (Signed)
  Chronic Care Management   Outreach Note  08/13/2020 Name: Sara Fox MRN: [id number] DOB: October 23, 1940  Referred by: [name], FNP Reason for referral : Chronic Care Management   SW received inbound call from Edwyna Shell, [place] CSW to discuss recent referral to assist patient with caregiver resources. Discussed this Probation officer has been involved with patient resource needs as an embedded SW assigned to patients PCP office. Determined the patient had radiation treatment today and son expressed difficulty caring for the patient due to increased agitation.   Follow Up Plan: CSW reports she will close referral considering BSW is actively involved with patient. BSW will follow up with the patients son on Monday April 4th to assist with caregiver resources.  [name], BSW, CDP Social Worker, Certified Dementia Practitioner [place] [facility] [place] [facility] [phone]

## 2020-08-13 NOTE — Progress Notes (Signed)
[place] CSW Progress Notes  CSW spoke w son, he relates that the difficulty is not with physical transportation to appointments.  His difficulties involve patient's resistance to help, difficulty with ADLs, behavioral management.  Discussed with him that patient has one more appointment for radiation treatment at [facility] (Monday), he believes he can get her to this appointment.  Reinforced that Education officer, museum at [facility] is available to help with linking patient/family to resources like [facility] and/or SNF. Reinforced that patient needs to apply for Medicaid - per son "I turned the application in last month and have not heard from them." Advised son to call DSS to check status of application.  He is aware of and appreciative of [facility] social worker and her efforts to help family manage patients needs.  She will call family Monday to try to help family find ways to meet patient's needs.  Closing referral as this need is better addressed by patient's PCP.  Edwyna Shell, LCSW Clinical Social Worker Phone:  [phone]

## 2020-08-13 NOTE — Progress Notes (Signed)
Received patient and son in the clinic following PUT treatment. Son request assistance saying "I can no longer handle her." Upon further discussion I discovered that the patient's PCP has placed an order for home health service but the assessment isn't set to occur until 08/26/2020. The son verbalizes, "I can't wait that long." Patient did not respond to questions asked but did turn her head to acknowledge she was being spoken too. The son reports he has been up with his mother since 0730 trying to get her ready for her treatment appointment at today. Patient in no distress. Patient appears unkept and clothing to include shoes appear dirty. Advised patient's son I would reach out to our social work team here for assistance. Patient son verbalized understanding and expressed appreciation for the assistance. Deferred case to Edwyna Shell, LCSW to further investigate. Patient left the clinic ambulatory in no distress with her son.

## 2020-08-16 ENCOUNTER — Ambulatory Visit (INDEPENDENT_AMBULATORY_CARE_PROVIDER_SITE_OTHER): Payer: Medicare Other

## 2020-08-16 ENCOUNTER — Ambulatory Visit
Admission: RE | Admit: 2020-08-16 | Discharge: 2020-08-16 | Disposition: A | Discharge status: Home / Self Care | Payer: Medicare Other | Source: Ambulatory Visit | Attending: Radiation Oncology | Admitting: Radiation Oncology

## 2020-08-16 ENCOUNTER — Other Ambulatory Visit: Payer: Self-pay

## 2020-08-16 ENCOUNTER — Encounter: Payer: Self-pay | Admitting: Radiation Oncology

## 2020-08-16 DIAGNOSIS — C3491 Malignant neoplasm of unspecified part of right bronchus or lung: Secondary | ICD-10-CM

## 2020-08-16 DIAGNOSIS — G3184 Mild cognitive impairment, so stated: Secondary | ICD-10-CM

## 2020-08-16 DIAGNOSIS — I5022 Chronic systolic (congestive) heart failure: Secondary | ICD-10-CM

## 2020-08-16 DIAGNOSIS — N1831 Chronic kidney disease, stage 3a: Secondary | ICD-10-CM

## 2020-08-16 DIAGNOSIS — I10 Essential (primary) hypertension: Secondary | ICD-10-CM

## 2020-08-16 NOTE — Chronic Care Management (AMB) (Signed)
Chronic Care Management    Social Work Note  08/16/2020 Name: Sara Fox MRN: [id number] DOB: 1941-01-05  Sara Fox is a 80 y.o. year old female who is a primary care patient of [name], [place]. The CCM team was consulted to assist the patient with chronic disease management and/or care coordination needs related to: Level of Care Concerns.   Engaged with patient daughter, [name] by phone for follow up visit in response to provider referral for social work chronic care management and care coordination services.   Consent to Services:  The patient was given information about Chronic Care Management services, agreed to services, and gave verbal consent prior to initiation of services.  Please see initial visit note for detailed documentation.   Patient agreed to services and consent obtained.   Assessment: Review of patient past medical history, allergies, medications, and health status, including review of relevant consultants reports was performed today as part of a comprehensive evaluation and provision of chronic care management and care coordination services.     SDOH (Social Determinants of Health) assessments and interventions performed:    Advanced Directives Status: Not addressed in this encounter.  CCM Care Plan  Allergies  Allergen Reactions  . Buprenorphine Hcl Shortness Of Breath    Throat swelling/trouble breathing and lethargic  . Morphine And Related Shortness Of Breath    Throat swelling/trouble breathing and lethargic  . Celebrex [Celecoxib] Diarrhea and Swelling    Swelling of legs   . Vioxx [Rofecoxib] Palpitations  . Codeine Other (See Comments)    Bloated     Outpatient Encounter Medications as of 08/16/2020  Medication Sig Note  . aspirin EC 81 MG tablet Take 81 mg by mouth daily. (Patient not taking: No sig reported) 05/24/2020: On hold due to upcoming procedure.  . Calcium Citrate-Vitamin D (CALCIUM CITRATE+D3 PETITES PO) Take 1 tablet by mouth  daily.   . cholecalciferol (VITAMIN D) 25 MCG (1000 UNIT) tablet Take 1,000 Units by mouth daily.   . ciclopirox (PENLAC) 8 % solution Apply 1 application topically at bedtime. Apply over nail and surrounding skin. Apply daily over previous coat. After seven (7) days, file nail and continue cycle.   . CVS MAGNESIUM OXIDE 250 MG TABS TAKE 1 TABLET BY MOUTH WITH EVENING MEAL DAILY   . donepezil (ARICEPT) 10 MG tablet Take 1 tablet (10 mg total) by mouth at bedtime.   . ferrous sulfate 325 (65 FE) MG tablet Take 325 mg by mouth daily with breakfast.   . furosemide (LASIX) 20 MG tablet TAKE 1 TABLET BY MOUTH EVERY OTHER DAY   . gabapentin (NEURONTIN) 100 MG capsule Take 1 capsule (100 mg total) by mouth 2 (two) times daily. (Patient not taking: No sig reported)   . Magnesium 250 MG TABS Take 250 mg by mouth every evening.   . metoprolol tartrate (LOPRESSOR) 25 MG tablet TAKE 1 TABLET BY MOUTH TWICE A DAY   . Multiple Vitamin (MULTIVITAMIN WITH MINERALS) TABS tablet Take 1 tablet by mouth daily.   . nitroGLYCERIN (NITROSTAT) 0.4 MG SL tablet PLACE ONE TABLET UNDER THE TONGUE EVERY 5 MINUTES AS NEEDED FOR CHEST PAIN. (Patient not taking: No sig reported)   . oxybutynin (DITROPAN) 5 MG tablet TAKE 1 TABLET BY MOUTH TWICE A DAY (Patient taking differently: Take 5 mg by mouth 2 (two) times daily.)   . pantoprazole (PROTONIX) 40 MG tablet Take 1 tablet (40 mg total) by mouth 2 (two) times daily.   . polyethylene  glycol (MIRALAX / GLYCOLAX) packet Take 17 g by mouth daily as needed for mild constipation.  (Patient not taking: No sig reported)   . Potassium Chloride ER 20 MEQ TBCR TAKE 2 TABLETS BY MOUTH EVERY MORNING (Patient taking differently: Take 40 mEq by mouth daily.)   . QUEtiapine (SEROQUEL) 25 MG tablet Take 1 tablet (25 mg total) by mouth at bedtime.   . rosuvastatin (CRESTOR) 40 MG tablet Take 1 tablet (40 mg total) by mouth every evening.   . SYMBICORT 160-4.5 MCG/ACT inhaler TAKE 2 PUFFS BY MOUTH  TWICE A DAY   . traMADol (ULTRAM) 50 MG tablet TAKE 1 TABLET 3 TIMES A DAY AND CAN TAKE 1 EXTRA 20 DAYS/MONTH 07/05/2020: Last filled on 06/25/2020 for # 110  On hand today # 54 plus some at home per Son Last taken 07/04/2020   No facility-administered encounter medications on file as of 08/16/2020.    Patient Active Problem List   Diagnosis Date Noted  . Non-small cell carcinoma of right lung, stage 1 ([place]) 06/08/2020  . Pulmonary nodule 1 cm or greater in diameter 05/21/2020  . Atherosclerotic heart disease of native coronary artery with other forms of angina pectoris ([place]) 04/22/2020  . Fatigue 02/18/2019  . Memory loss 02/18/2019  . Vitamin B12 deficiency 10/23/2018  . Hypotension 10/23/2018  . Stage 3 chronic kidney disease ([place]) 06/03/2018  . Chronic anemia 06/03/2018  . Hypercholesterolemia 06/03/2018  . Malnutrition of moderate degree 07/12/2017  . Hypoalbuminemia 07/11/2017  . Elevated LFTs 07/11/2017  . Closed fracture of left distal radius 05/28/2017  . Snoring 12/21/2016  . OSA and COPD overlap syndrome ([place]) 12/21/2016  . Peripheral musculoskeletal gait disorder 02/05/2015  . Lumbar post-laminectomy syndrome 02/05/2015  . CAD in native artery 10/16/2014  . NSTEMI (non-ST elevated myocardial infarction) ([place]) 09/13/2014  . Cardiomyopathy, ischemic 09/13/2014  . Chronic systolic heart failure ([place])   . Tobacco abuse 09/12/2014  . Dyslipidemia 09/12/2014  . CKD (chronic kidney disease), stage II 09/12/2014  . Normocytic anemia 09/12/2014  . Chronic diastolic heart failure, NYHA class 1 ([place]) 09/12/2014  . Postlaminectomy syndrome, cervical region 08/01/2013  . Chronic midline low back pain with left-sided sciatica 08/01/2013  . Shoulder joint contracture 08/01/2013  . HTN (hypertension) 03/03/2013  . Abnormal genetic test 03/03/2013    Conditions to be addressed/monitored: CHF, HTN, CKD Stage III and mild cognitive impairment; Limited access to caregiver  Care Plan  : Social Work [place]  Updates made by [name] since 08/16/2020 12:00 AM    Problem: Home and Family Safety     Goal: Home and Family Safety Maintained   Start Date: 08/16/2020  Expected End Date: 09/15/2020  Priority: High  Note:   Current Barriers:  . Chronic disease management support and education needs related to CHF, HTN, CKD Stage III, and Mild Cognitive Impairment   . Limited access to caregiver  Social Worker Clinical Goal(s):  Marland Kitchen Over the next 30 days, patient and her family will work with SW to identify resources to obtain home and family safety  CCM SW Interventions:  . Inter-disciplinary care team collaboration (see longitudinal plan of care) . Collaboration with [name], FNP regarding development and update of comprehensive plan of care as evidenced by provider attestation and co-signature . Inbound call received from the patients daughter Tilda Burrow who expresses concerns with patient safety within the home - per [name] the patient lives with her son, [name] who works a second shift schedule.  He is often out of the home from 1:00 pm - 2:00 am. She reports her brother watches the patient via a ring camera when he is not in the home for safety. [name] reports she is not sure if her mother eats when she is alone but notes she is unable to prepare food due to the stove being broken . Discussed the patients cognition has declined and [name] is concerned with her mothers hygiene - she reports the patient has been left to sit in feces and has recently attended an appointment at the cancer center with feces on her face and clothes due to the patient being combative and not willing to change . Reviewed the families desire for immediate placement noting that although [name] is trying to care for the patient he is unable to meet her needs at this time . Determined [name] would like SW assistance with placement into a facility . Advised [name] this SW  planned to make an APS report to assess patient current living conditions and assist with expedited placement is deemed necessary . Collaboration with patients primary provider [name] FNP who agrees with APS referral . Referral placed to Amy with [facility] requests a return call to advise if the referral would be assigned a case worker . Collaboration with [place] to inform of intervention and plan . Scheduled follow up call over the next 10 days  Patient Goals/Self-Care Activities . Over the next 10 days, patient will:   - Patient will attend all scheduled provider appointments Engage with APS Case Worker as appropriate  Follow Up Plan:  SW will follow up with the patient and her family over the next 10 days       Follow Up Plan: SW will follow up with patient by phone over the next 10 days.      [name], BSW, CDP Social Worker, Certified Dementia Practitioner [place] [facility] [place] [facility] [phone]  Total time spent performing care coordination and/or care management activities with the patient by phone or face to face = 78 minutes.

## 2020-08-16 NOTE — Patient Instructions (Signed)
Social Worker Visit Information  Goals we discussed today:  Goals Addressed            This Visit's Progress   . Home and Family Safety Maintained   On track    Timeframe:  Short-Term Goal Priority:  High Start Date:   4.4.22                          Expected End Date: 5.4.22                      Next planned outreach: 4.12.22  Patient Goals/Self-Care Activities . Over the next 10 days, patient will:   - Patient will attend all scheduled provider appointments         Follow Up Plan: SW will follow up over the next 10 days   [name], BSW, CDP Social Worker, Certified Dementia Practitioner [place] [facility] [place] [facility] [phone]

## 2020-08-17 ENCOUNTER — Ambulatory Visit (INDEPENDENT_AMBULATORY_CARE_PROVIDER_SITE_OTHER): Payer: Medicare Other | Admitting: Nurse Practitioner

## 2020-08-17 ENCOUNTER — Encounter: Payer: Self-pay | Admitting: Nurse Practitioner

## 2020-08-17 VITALS — BP 112/78 | HR 95 | Temp 98.1°F | Ht 65.0 in | Wt 126.0 lb

## 2020-08-17 DIAGNOSIS — G3184 Mild cognitive impairment, so stated: Secondary | ICD-10-CM

## 2020-08-17 DIAGNOSIS — R4189 Other symptoms and signs involving cognitive functions and awareness: Secondary | ICD-10-CM | POA: Diagnosis not present

## 2020-08-17 NOTE — Progress Notes (Signed)
I,Yamilka [employer] as a Education administrator for [employer], FNP.,have documented all relevant documentation on the behalf of [name], FNP,as directed by  [name], FNP while in the presence of [name], [place]. This visit occurred during the SARS-CoV-2 public health emergency.  Safety protocols were in place, including screening questions prior to the visit, additional usage of staff PPE, and extensive cleaning of exam room while observing appropriate contact time as indicated for disinfecting solutions.  Subjective:     Patient ID: Sara Fox , female    DOB: Jul 29, 1940 , 80 y.o.   MRN: [id number]   Chief Complaint  Patient presents with  . Memory Loss    HPI  Patient here for a f/u on her dementia. She was started on Seroquel and she feels this is doing "okay".  Her son reports sometimes she is alright and sometimes. Her son reports he gave clean clothes to come to the doctor appt.  His friend will come over on weekends to help with his mom. He had asked his sister to come and help last week .  She is concerned about the camera being in the house.   She does have on a dirty shirt and pants have pet dander present.      Hyperlipidemia This is a chronic problem. The problem is uncontrolled. There are no known factors aggravating her hyperlipidemia. Pertinent negatives include no chest pain. There are no compliance problems.  Risk factors for coronary artery disease include a sedentary lifestyle.     Past Medical History:  Diagnosis Date  . Acute kidney injury ([place])   . Back pain   . Cervical cancer ([place])   . CHF (congestive heart failure) ([place])   . COPD (chronic obstructive pulmonary disease) ([place])   . Coronary artery disease    s/p DES x2 to RCA 2016  . GERD (gastroesophageal reflux disease)   . Hyperlipidemia   . Hypertension   . Mild cognitive impairment   . Myocardial infarction ([place])   . Neck pain   . Rhabdomyolysis   . Stomach problems      Family  History  Problem Relation Age of Onset  . Heart disease Mother        also HTN  . Diabetes Mother   . Colon cancer Mother   . Heart disease Brother        deceased at 14  . Hypertension Brother   . Heart attack Brother   . Heart disease Brother   . Hypertension Brother      Current Outpatient Medications:  .  aspirin EC 81 MG tablet, Take 81 mg by mouth daily., Disp: , Rfl:  .  Calcium Citrate-Vitamin D (CALCIUM CITRATE+D3 PETITES PO), Take 1 tablet by mouth daily., Disp: , Rfl:  .  cholecalciferol (VITAMIN D) 25 MCG (1000 UNIT) tablet, Take 1,000 Units by mouth daily., Disp: , Rfl:  .  ciclopirox (PENLAC) 8 % solution, Apply 1 application topically at bedtime. Apply over nail and surrounding skin. Apply daily over previous coat. After seven (7) days, file nail and continue cycle., Disp: , Rfl:  .  [employer] MAGNESIUM OXIDE 250 MG TABS, TAKE 1 TABLET BY MOUTH WITH EVENING MEAL DAILY, Disp: 90 tablet, Rfl: 1 .  donepezil (ARICEPT) 10 MG tablet, Take 1 tablet (10 mg total) by mouth at bedtime., Disp: 90 tablet, Rfl: 1 .  ferrous sulfate 325 (65 FE) MG tablet, Take 325 mg by mouth daily with breakfast., Disp: , Rfl:  .  furosemide (LASIX) 20 MG tablet, TAKE 1 TABLET BY MOUTH EVERY OTHER DAY, Disp: 45 tablet, Rfl: 1 .  Magnesium 250 MG TABS, Take 250 mg by mouth every evening., Disp: , Rfl:  .  metoprolol tartrate (LOPRESSOR) 25 MG tablet, TAKE 1 TABLET BY MOUTH TWICE A DAY, Disp: 180 tablet, Rfl: 1 .  Multiple Vitamin (MULTIVITAMIN WITH MINERALS) TABS tablet, Take 1 tablet by mouth daily., Disp: , Rfl:  .  nitroGLYCERIN (NITROSTAT) 0.4 MG SL tablet, PLACE ONE TABLET UNDER THE TONGUE EVERY 5 MINUTES AS NEEDED FOR CHEST PAIN., Disp: 50 tablet, Rfl: 0 .  oxybutynin (DITROPAN) 5 MG tablet, TAKE 1 TABLET BY MOUTH TWICE A DAY (Patient taking differently: Take 5 mg by mouth 2 (two) times daily.), Disp: 180 tablet, Rfl: 1 .  pantoprazole (PROTONIX) 40 MG tablet, Take 1 tablet (40 mg total) by mouth 2  (two) times daily., Disp: 180 tablet, Rfl: 1 .  Potassium Chloride ER 20 MEQ TBCR, TAKE 2 TABLETS BY MOUTH EVERY MORNING (Patient taking differently: Take 40 mEq by mouth daily.), Disp: 180 tablet, Rfl: 3 .  QUEtiapine (SEROQUEL) 25 MG tablet, Take 1 tablet (25 mg total) by mouth at bedtime., Disp: 30 tablet, Rfl: 3 .  rosuvastatin (CRESTOR) 40 MG tablet, Take 1 tablet (40 mg total) by mouth every evening., Disp: , Rfl:  .  SYMBICORT 160-4.5 MCG/ACT inhaler, TAKE 2 PUFFS BY MOUTH TWICE A DAY, Disp: 30.6 each, Rfl: 1 .  traMADol (ULTRAM) 50 MG tablet, TAKE 1 TABLET 3 TIMES A DAY AND CAN TAKE 1 EXTRA 20 DAYS/MONTH, Disp: 110 tablet, Rfl: 2 .  gabapentin (NEURONTIN) 100 MG capsule, Take 1 capsule (100 mg total) by mouth 2 (two) times daily. (Patient not taking: No sig reported), Disp: , Rfl:  .  polyethylene glycol (MIRALAX / GLYCOLAX) packet, Take 17 g by mouth daily as needed for mild constipation.  (Patient not taking: No sig reported), Disp: , Rfl:    Allergies  Allergen Reactions  . Buprenorphine Hcl Shortness Of Breath    Throat swelling/trouble breathing and lethargic  . Morphine And Related Shortness Of Breath    Throat swelling/trouble breathing and lethargic  . Celebrex [Celecoxib] Diarrhea and Swelling    Swelling of legs   . Vioxx [Rofecoxib] Palpitations  . Codeine Other (See Comments)    Bloated      Review of Systems  Constitutional: Negative.  Negative for fatigue.  Respiratory: Negative.   Cardiovascular: Negative for chest pain, palpitations and leg swelling.  Psychiatric/Behavioral: Positive for agitation and confusion (at times son reports she will not change her clothes even if he puts out new clothes).     Today's Vitals   08/17/20 1133  BP: 112/78  Pulse: 95  Temp: 98.1 F (36.7 C)  TempSrc: Oral  Weight: 126 lb (57.2 kg)  Height: 5\' 5"  (1.651 m)  PainSc: 0-No pain   Body mass index is 20.97 kg/m.   Objective:  Physical Exam Vitals reviewed.   Constitutional:      General: She is not in acute distress.    Appearance: Normal appearance.  Cardiovascular:     Rate and Rhythm: Normal rate and regular rhythm.     Pulses: Normal pulses.     Heart sounds: Normal heart sounds. No murmur heard.   Pulmonary:     Effort: Pulmonary effort is normal. No respiratory distress.     Breath sounds: No wheezing.  Neurological:     General: No focal deficit present.  Mental Status: She is alert.     Cranial Nerves: No cranial nerve deficit.     Motor: No weakness.  Psychiatric:        Mood and Affect: Mood normal. Affect is inappropriate (at times she will say something inappropriate).        Thought Content: Thought content normal.        Cognition and Memory: She exhibits impaired recent memory.     Comments: She has on mismatched socks. Dirty shirt as well with pet dander on her pants.          Assessment And Plan:     1. Impairment of cognitive function  Her behaviors are getting worse and her and her son have multiple times of resistance. She is now wearing the same clothes and not bathing. She had an episode where she is not wanting to go for her treatments for lung cancer. There also seems to be some family dynamics with the son and daughter and the ability for him to care for his mother. He has been bringing her to her appts and the patient will often say he is not doing something but he will then explain what he is doing. I have noticed her behaviors and memory has worsened since she has been diagnosed with lung cancer. I will refer her to Neurology for further evaluation. She is tolerating donepezil well.  - Ambulatory referral to Neurology    I personally spent 25 minutes face-to-face and non-face-to-face in the care of this patient, which includes all pre-, intra-, and post visit time on the date of service.  Patient was given opportunity to ask questions. Patient verbalized understanding of the plan and was able to repeat  key elements of the plan. All questions were answered to their satisfaction.  [name], FNP   I, [name], FNP, have reviewed all documentation for this visit. The documentation on 08/25/20 for the exam, diagnosis, procedures, and orders are all accurate and complete.   IF YOU HAVE BEEN REFERRED TO A SPECIALIST, IT MAY TAKE 1-2 WEEKS TO SCHEDULE/PROCESS THE REFERRAL. IF YOU HAVE NOT HEARD FROM US/SPECIALIST IN TWO WEEKS, PLEASE GIVE [place] A CALL AT [phone].   THE PATIENT IS ENCOURAGED TO PRACTICE SOCIAL DISTANCING DUE TO THE COVID-19 PANDEMIC.

## 2020-08-17 NOTE — Patient Instructions (Signed)
Alzheimer's Disease Alzheimer's disease is a brain disease that affects memory, thinking, language, and behavior. People with Alzheimer's disease lose mental abilities, and the disease gets worse over time. Alzheimer's disease is a form of dementia. What are the causes? This condition develops when a protein called beta-amyloid forms deposits in the brain. It is not known what causes these deposits to form. Alzheimer's disease may also be caused by a gene mutation that is inherited from one parent or both parents. A gene mutation is a harmful change in a gene. Not everyone who inherits the genetic mutation will get the disease. What increases the risk? You are more likely to develop this condition if you:  Are older than age 52.  Are female.  Have any of these medical conditions: ? High blood pressure. ? Diabetes. ? Heart or blood vessel disease.  Smoke.  Have obesity.  Have had a brain injury.  Have had a stroke.  Have a family history of dementia. What are the signs or symptoms? Symptoms of this condition may happen in three stages, which often overlap. Early stage In this stage, you may continue to be independent. You may still be able to drive, work, and be social. Symptoms in this stage include:  Minor memory problems, such as forgetting a name, words, or what you did recently.  Difficulty with: ? Paying attention. ? Communicating. ? Doing familiar tasks. ? Problem solving or doing calculations. ? Following instructions. ? Learning new things.  Anxiety.  Social withdrawal.  Loss of motivation. Moderate stage In this stage, you will start to need care. Symptoms in this stage include:  Difficulty with expressing thoughts.  Memory loss that affects daily life. This can include forgetting: ? Recent events that have happened. ? If you have taken medicines or eaten. ? Familiar places. You may get lost while walking or driving. ? To pay bills or manage  finances. ? Personal hygiene such as bathing or using the bathroom.  Confusion about where you are or what time it is.  Difficulty in judging distance.  Changes in personality, mood, and behavior. You may be moody, irritable, angry, frustrated, fearful, anxious, or suspicious.  Poor reasoning and judgment.  Delusions or hallucinations.  Changes in sleep patterns. Severe stage In the final stage, you will need help with your personal care and daily activities. Symptoms in this stage include:  Worsening memory loss.  Personality changes.  Loss of awareness of your surroundings.  Changes in physical abilities, including the ability to walk, sit, and swallow.  Difficulty in communicating.  Inability to control your bladder and bowels.  Increasing confusion.  Increasing behavior changes. How is this diagnosed? This condition is diagnosed by a health care provider who specializes in diseases of the nervous system (neurologist) or one who specializes in care of the elderly (geriatrician or geriatric psychiatrist). Other causes of dementia may also be ruled out. Your health care provider will talk with you and your family, friends, or caregivers about your history and symptoms. A thorough medical history will be taken, and you will have a physical exam and tests. Tests may include:  Lab tests, such as blood or urine tests.  Imaging tests, such as a CT scan, a PET scan, or an MRI.  A lumbar puncture. This test involves removing and testing a small amount of the fluid that surrounds the brain and spinal cord.  An electroencephalogram (EEG). In this test, small metal discs are used to measure electrical activity in the brain.  Memory tests, cognitive tests, and neuropsychological tests. These tests evaluate brain function.  Genetic testing. This may be done if you have early onset of the disease (before age 93) or if other family members have the disease.   How is this  treated? At this time, there is no treatment to cure Alzheimer's disease or stop it from getting worse. The goals of treatment are:  To manage behavioral changes.  To provide you with a safe environment.  To help manage daily life for you and your caregivers. The following treatment options are available:  Medicines. Medicines may help the memory work better and manage behavioral symptoms.  Cognitive therapy. Cognitive therapy provides you with education, support, and memory aids. It is most helpful in the early stages of the condition.  Counseling or spiritual guidance. It is normal to have a lot of feelings, including anger, relief, fear, and isolation. Counseling and guidance can help you deal with these feelings.  Caregiving. This involves having caregivers help you with your daily activities.  Family support groups. These provide education, emotional support, and information about community resources to family members who are taking care of you. Follow these instructions at home: Medicines  Take over-the-counter and prescription medicines only as told by your health care provider.  Use a pill organizer or pill reminder to help you manage your medicines.  Avoid taking medicines that can affect thinking, such as pain medicines or sleeping medicines. Lifestyle  Make healthy lifestyle choices: ? Be physically active as told by your health care provider. Regular exercise may help improve symptoms. ? Do not use any products that contain nicotine or tobacco, such as cigarettes, e-cigarettes, and chewing tobacco. If you need help quitting, ask your health care provider. ? Do not drink alcohol. ? Eat a healthy diet. ? Practice stress-management techniques when you get stressed. ? Stay social.  Drink enough fluid to keep your urine pale yellow.  Make sure to get quality sleep. ? Avoid taking long naps during the day. Take short naps of 30 minutes or less if needed. ? Keep your  sleeping area dark and cool. ? Avoid exercising during the few hours before you go to bed. ? Avoid caffeine products in the afternoon and evening. General instructions  Work with your health care provider to determine what you need help with and what your safety needs are.  If you were given a bracelet that identifies you as a person with memory loss or tracks your location, make sure to wear it at all times.  Talk with your health care provider about whether it is safe for you to drive.  Work with your family to make important decisions, such as advance directives, medical power of attorney, or a living will.  Keep all follow-up visits. This is important.   Where to find more information  The Alzheimer's Association: Call the 24-hour helpline at 1-204-256-8909, or visit [employer] Contact a health care provider if:  You have nausea, vomiting, or trouble with eating related to a medicine.  You have worsening mood or behavior changes, such as depression, anxiety, or hallucinations.  You or your family members become concerned for your safety. Get help right away if:  You become less responsive or are difficult to wake up.  Your memory suddenly gets worse.  You feel that you want to harm yourself. If you ever feel like you may hurt yourself or others, or have thoughts about taking your own life, get help right away. Go to  your nearest emergency department or:  Call your local emergency services (911 in the [place]  Call a suicide crisis helpline, such as the [place] at 925-751-9757. This is open 24 hours a day in the [place]  Text the Crisis Text Line at 404-645-4093 (in the [place].). Summary  Alzheimer's disease is a brain disease that affects memory, thinking, language, and behavior. Alzheimer's disease is a form of dementia.  This condition is diagnosed by a specialist in diseases of the nervous system (neurologist) or one who specializes in care of the  elderly.  At this time, there is no treatment to cure Alzheimer's disease or stop it from getting worse. The goal of treatment is to help you manage any symptoms.  Work with your family to make important decisions, such as advance directives, medical power of attorney, or a living will. This information is not intended to replace advice given to you by your health care provider. Make sure you discuss any questions you have with your health care provider. Document Revised: 08/18/2019 Document Reviewed: 08/18/2019 Elsevier Patient Education  2021 [employer].

## 2020-08-24 ENCOUNTER — Ambulatory Visit: Payer: Medicare Other

## 2020-08-24 DIAGNOSIS — I10 Essential (primary) hypertension: Secondary | ICD-10-CM

## 2020-08-24 DIAGNOSIS — N1831 Chronic kidney disease, stage 3a: Secondary | ICD-10-CM

## 2020-08-24 DIAGNOSIS — G3184 Mild cognitive impairment, so stated: Secondary | ICD-10-CM

## 2020-08-24 DIAGNOSIS — I5022 Chronic systolic (congestive) heart failure: Secondary | ICD-10-CM

## 2020-08-24 NOTE — Patient Instructions (Signed)
Social Worker Visit Information  Goals we discussed today:  Goals Addressed            This Visit's Progress   . Home and Family Safety Maintained       Timeframe:  Short-Term Goal Priority:  High Start Date:   4.4.22                          Expected End Date: 5.4.22                      Next planned outreach: 4.22.22  Patient Goals/Self-Care Activities . Over the next 10 days, patient will: With the help of her son  - Patient will attend all scheduled provider appointments Patient will attend all scheduled provider appointments Engage with Palliative Care         Follow Up Plan: SW will follow up with patient by phone over the next two weeks   [name], BSW, CDP Social Worker, Certified Dementia Practitioner [place] [facility] [phone]

## 2020-08-24 NOTE — Chronic Care Management (AMB) (Signed)
Chronic Care Management    Social Work Note  08/24/2020 Name: Sara Fox MRN: [id number] DOB: 10/11/1940  Sara Fox is a 80 y.o. year old female who is a primary care patient of [name], [place]. The CCM team was consulted to assist the patient with chronic disease management and/or care coordination needs related to: Level of Care Concerns.   Engaged with patients son by phone for follow up visit in response to provider referral for social work chronic care management and care coordination services.   Consent to Services:  The patient was given information about Chronic Care Management services, agreed to services, and gave verbal consent prior to initiation of services.  Please see initial visit note for detailed documentation.   Patient agreed to services and consent obtained.   Assessment: Review of patient past medical history, allergies, medications, and health status, including review of relevant consultants reports was performed today as part of a comprehensive evaluation and provision of chronic care management and care coordination services.     SDOH (Social Determinants of Health) assessments and interventions performed:    Advanced Directives Status: Not addressed in this encounter.  CCM Care Plan  Allergies  Allergen Reactions  . Buprenorphine Hcl Shortness Of Breath    Throat swelling/trouble breathing and lethargic  . Morphine And Related Shortness Of Breath    Throat swelling/trouble breathing and lethargic  . Celebrex [Celecoxib] Diarrhea and Swelling    Swelling of legs   . Vioxx [Rofecoxib] Palpitations  . Codeine Other (See Comments)    Bloated     Outpatient Encounter Medications as of 08/24/2020  Medication Sig Note  . aspirin EC 81 MG tablet Take 81 mg by mouth daily. 05/24/2020: On hold due to upcoming procedure.  . Calcium Citrate-Vitamin D (CALCIUM CITRATE+D3 PETITES PO) Take 1 tablet by mouth daily.   . cholecalciferol (VITAMIN D) 25 MCG  (1000 UNIT) tablet Take 1,000 Units by mouth daily.   . ciclopirox (PENLAC) 8 % solution Apply 1 application topically at bedtime. Apply over nail and surrounding skin. Apply daily over previous coat. After seven (7) days, file nail and continue cycle.   . [facility] MAGNESIUM OXIDE 250 MG TABS TAKE 1 TABLET BY MOUTH WITH EVENING MEAL DAILY   . donepezil (ARICEPT) 10 MG tablet Take 1 tablet (10 mg total) by mouth at bedtime.   . ferrous sulfate 325 (65 FE) MG tablet Take 325 mg by mouth daily with breakfast.   . furosemide (LASIX) 20 MG tablet TAKE 1 TABLET BY MOUTH EVERY OTHER DAY   . gabapentin (NEURONTIN) 100 MG capsule Take 1 capsule (100 mg total) by mouth 2 (two) times daily. (Patient not taking: No sig reported)   . Magnesium 250 MG TABS Take 250 mg by mouth every evening.   . metoprolol tartrate (LOPRESSOR) 25 MG tablet TAKE 1 TABLET BY MOUTH TWICE A DAY   . Multiple Vitamin (MULTIVITAMIN WITH MINERALS) TABS tablet Take 1 tablet by mouth daily.   . nitroGLYCERIN (NITROSTAT) 0.4 MG SL tablet PLACE ONE TABLET UNDER THE TONGUE EVERY 5 MINUTES AS NEEDED FOR CHEST PAIN.   Marland Kitchen oxybutynin (DITROPAN) 5 MG tablet TAKE 1 TABLET BY MOUTH TWICE A DAY (Patient taking differently: Take 5 mg by mouth 2 (two) times daily.)   . pantoprazole (PROTONIX) 40 MG tablet Take 1 tablet (40 mg total) by mouth 2 (two) times daily.   . polyethylene glycol (MIRALAX / GLYCOLAX) packet Take 17 g by mouth daily as needed  for mild constipation.  (Patient not taking: No sig reported)   . Potassium Chloride ER 20 MEQ TBCR TAKE 2 TABLETS BY MOUTH EVERY MORNING (Patient taking differently: Take 40 mEq by mouth daily.)   . QUEtiapine (SEROQUEL) 25 MG tablet Take 1 tablet (25 mg total) by mouth at bedtime.   . rosuvastatin (CRESTOR) 40 MG tablet Take 1 tablet (40 mg total) by mouth every evening.   . SYMBICORT 160-4.5 MCG/ACT inhaler TAKE 2 PUFFS BY MOUTH TWICE A DAY   . traMADol (ULTRAM) 50 MG tablet TAKE 1 TABLET 3 TIMES A DAY AND CAN  TAKE 1 EXTRA 20 DAYS/MONTH 07/05/2020: Last filled on 06/25/2020 for # 110  On hand today # 54 plus some at home per Son Last taken 07/04/2020   No facility-administered encounter medications on file as of 08/24/2020.    Patient Active Problem List   Diagnosis Date Noted  . Non-small cell carcinoma of right lung, stage 1 ([place]) 06/08/2020  . Pulmonary nodule 1 cm or greater in diameter 05/21/2020  . Atherosclerotic heart disease of native coronary artery with other forms of angina pectoris ([place]) 04/22/2020  . Fatigue 02/18/2019  . Memory loss 02/18/2019  . Vitamin B12 deficiency 10/23/2018  . Hypotension 10/23/2018  . Stage 3 chronic kidney disease ([place]) 06/03/2018  . Chronic anemia 06/03/2018  . Hypercholesterolemia 06/03/2018  . Malnutrition of moderate degree 07/12/2017  . Hypoalbuminemia 07/11/2017  . Elevated LFTs 07/11/2017  . Closed fracture of left distal radius 05/28/2017  . Snoring 12/21/2016  . OSA and COPD overlap syndrome ([place]) 12/21/2016  . Peripheral musculoskeletal gait disorder 02/05/2015  . Lumbar post-laminectomy syndrome 02/05/2015  . CAD in native artery 10/16/2014  . NSTEMI (non-ST elevated myocardial infarction) ([place]) 09/13/2014  . Cardiomyopathy, ischemic 09/13/2014  . Chronic systolic heart failure ([place])   . Tobacco abuse 09/12/2014  . Dyslipidemia 09/12/2014  . CKD (chronic kidney disease), stage II 09/12/2014  . Normocytic anemia 09/12/2014  . Chronic diastolic heart failure, NYHA class 1 ([place]) 09/12/2014  . Postlaminectomy syndrome, cervical region 08/01/2013  . Chronic midline low back pain with left-sided sciatica 08/01/2013  . Shoulder joint contracture 08/01/2013  . HTN (hypertension) 03/03/2013  . Abnormal genetic test 03/03/2013    Conditions to be addressed/monitored: CHF, HTN, CKD Stage III and Mild Cognitive Impairment; Level of care concerns and Limited access to caregiver  Care Plan : Social Work [facility] Care Plan  Updates made by [name] since 08/24/2020 12:00 AM    Problem: Home and Family Safety     Goal: Home and Family Safety Maintained   Start Date: 08/16/2020  Expected End Date: 09/15/2020  Priority: High  Note:   Current Barriers:  . Chronic disease management support and education needs related to CHF, HTN, CKD Stage III, and Mild Cognitive Impairment   . Limited access to caregiver  Social Worker Clinical Goal(s):  Marland Kitchen Over the next 30 days, patient and her family will work with SW to identify resources to obtain home and family safety  CCM SW Interventions:  . Inter-disciplinary care team collaboration (see longitudinal plan of care) . Collaboration with [name], FNP regarding development and update of comprehensive plan of care as evidenced by provider attestation and co-signature . Successful outbound call placed to the patients daughter [name] who reports she is on another call and cannot speak to this SW at this time . Successful outbound call placed to the patients son [name] to assess for caregiver resource needs . Discussed  the patient has a scheduled Palliative Care home visit for 4.14.22 . [name] is not sure what caregiver resources he is in need of at this time . Scheduled follow up call for 4.22 to discuss outcome of Palliative Care visit and how SW may assist with resource needs  Patient Goals/Self-Care Activities . Over the next 14 days, patient will: With the help of her son  - Patient will attend all scheduled provider appointments Engage with [facility] as needed prior to next scheduled call  Follow Up Plan:  SW will follow up with the patient and her family over the next 14 days       Follow Up Plan: SW will follow up with patient by phone over the next two weeks      [name], BSW, CDP Social Worker, Certified Dementia Practitioner [place] [facility] [place] [facility] [phone]  Total time spent performing care coordination and/or care management activities  with the patient by phone or face to face = 15 minutes.

## 2020-08-25 ENCOUNTER — Ambulatory Visit: Payer: Medicare Other | Admitting: Nurse Practitioner

## 2020-08-25 ENCOUNTER — Encounter: Payer: Self-pay | Admitting: Nurse Practitioner

## 2020-08-26 ENCOUNTER — Other Ambulatory Visit: Payer: Medicare Other | Admitting: Hospice

## 2020-08-26 ENCOUNTER — Other Ambulatory Visit: Payer: Self-pay

## 2020-08-26 DIAGNOSIS — Z515 Encounter for palliative care: Secondary | ICD-10-CM

## 2020-08-26 DIAGNOSIS — R451 Restlessness and agitation: Secondary | ICD-10-CM

## 2020-08-26 DIAGNOSIS — R413 Other amnesia: Secondary | ICD-10-CM

## 2020-08-26 DIAGNOSIS — C3491 Malignant neoplasm of unspecified part of right bronchus or lung: Secondary | ICD-10-CM

## 2020-08-26 NOTE — Progress Notes (Signed)
Therapist, nutritional Palliative Care Consult Note Telephone: [phone]  Fax: [phone]  PATIENT NAME: Sara Fox [address] [place] [address] [phone] (home)  DOB: 06-Aug-1940 MRN: [id number]  PRIMARY CARE PROVIDER:    Arnette Felts, FNP,  [address] [place] [address] [phone]  REFERRING PROVIDER:   Arnette Felts, FNP [address] [place],  [place] [address] [phone]  RESPONSIBLE PARTY:   Sara Fox Information    Name Relation Home Work Mobile   Sara Fox Son [phone]     Sara Fox Granddaughter   [phone]   Sara Fox Daughter   [phone]   Sara Fox Daughter   [phone]      I met face to face with patient and Sara Fox. Palliative Care was asked to follow this patient by consultation request of  Sara Felts, FNP to address advance care planning and complex medical decision making. Sara Fox is present with patient during visit. This is the initial visit.    ASSESSMENT AND / RECOMMENDATIONS:   Advance Care Planning/Goals of Care: Goals include to maximize quality of life and symptom management. Our advance care planning conversation included a discussion about:     The value and importance of advance care planning   Difference between Hospice and Palliative care  Experiences with loved ones who have been seriously ill or have died   Exploration of goals of care in the event of a sudden injury or illness   Identification and preparation of a healthcare agent   Review and updating or creation of an  advance directive document .  Decision not to resuscitate or to de-escalate disease focused treatments due to poor prognosis.  CODE STATUS: Discussion on the implications and ramifications of CODE STATUS.  Patient elected DO NOT RESUSCITATE.  NP signed DNR form for patient to keep at home; same document uploaded to epic today.  I spent 46  minutes  providing this initial consultation. More than 50% of the time in this consultation was spent on counseling patient and coordinating communication. --------------------------------------------------------------------------------------------------------------------------------------  Symptom Management/Plan: Memory loss: Related to Dementia. Encourage reminiscence, word search/puzzles, cueing for recollection.  Promote calm approach and engaging environment.  Continue donepezil  as currently ordered.  Continue ongoing supportive care. Lung CA: Recently diagnosed this year, completed radiation last month. Appointment in June 2022 with Oncologist to evaluate treatment. Patient/family not interested in Chemo. Patient continues to smoke. Smoking cessation discussed; patient said she will start by cutting back from half a pack to 6 cigarretes a day.  Agitation: Continue Seroquel at bedtime. Effective.  Follow up Palliative Care Visit: Palliative care will continue to follow for complex medical decision making, advance care planning, and clarification of goals. Return 6 weeks or prn.Encouraged to call provider sooner with any concerns.   PPS: 50%  HOSPICE ELIGIBILITY/DIAGNOSIS: TBD  Chief Complaint: Initial palliative consult/memory loss  HISTORY OF PRESENT ILLNESS:  Sara Fox is a 80 y.o. year old female  with multiple medical conditions including memory loss which has been chronic, intermittent; patient said it is related to her diagnosis of Dementia; memory loss is worsening in the past month,  impairing her independence, her short term memory and quality of life. She said she is most forgetful during the day in the afternoon; not sure if anything helps her memory .  She denies fever/chills/pain, no urinary symptoms.  History of squamous cell lung cancer stage I, CAD, CKD, hypertension, Dementia, gait disorder, CHF. History obtained from  review of EMR, discussion with primary team, and interview  with family and/or Sara Fox.   Review and summarization of Epic records shows history from other than patient. Rest of 10 point ROS asked and negative.  Palliative Care was asked to follow this patient by consultation request of Sara Brine, FNP to help address complex decision making in the context of advance care planning and goals of care clarification.    Review of lab tests/diagnostics   Results for Sara Fox (MRN [id number]) as of 08/26/2020 16:42  Ref. Range 08/03/2020 16:33  Bilirubin, UA Unknown negative  Clarity, UA Unknown cloudy  Color, UA Unknown yellow  Glucose Latest Ref Range: Negative  Negative  Ketones, UA Unknown negative  Leukocytes,UA Latest Ref Range: Negative  Negative  Nitrite, UA Unknown negative  pH, UA Latest Ref Range: 5.0 - 8.0  6.0  Protein,UA Latest Ref Range: Negative  Positive (A)  Specific Gravity, UA Latest Ref Range: 1.010 - 1.025  1.010  Urobilinogen, UA Latest Ref Range: 0.2 or 1.0 E.U./dL 0.2  RBC, UA Unknown negative  Results for Sara Fox (MRN [id number]) as of 08/26/2020 16:42  Ref. Range 06/08/2020 13:36  Sodium Latest Ref Range: 135 - 145 mmol/L 141  Potassium Latest Ref Range: 3.5 - 5.1 mmol/L 3.8  Chloride Latest Ref Range: 98 - 111 mmol/L 109  CO2 Latest Ref Range: 22 - 32 mmol/L 26  Glucose Latest Ref Range: 70 - 99 mg/dL 85  BUN Latest Ref Range: 8 - 23 mg/dL 13  Creatinine Latest Ref Range: 0.44 - 1.00 mg/dL 1.42 (H)  Calcium Latest Ref Range: 8.9 - 10.3 mg/dL 9.6  Anion gap Latest Ref Range: 5 - 15  6  Alkaline Phosphatase Latest Ref Range: 38 - 126 U/L 87  Albumin Latest Ref Range: 3.5 - 5.0 g/dL 3.6  AST Latest Ref Range: 15 - 41 U/L 20  ALT Latest Ref Range: 0 - 44 U/L 13  Total Protein Latest Ref Range: 6.5 - 8.1 g/dL 7.2  Total Bilirubin Latest Ref Range: 0.3 - 1.2 mg/dL 0.4  GFR, Est Non African American Latest Ref Range: >60 mL/min 37 (L)   ROS General: NAD EYES: denies vision changes, wears eyeglasses ENMT:  denies dysphagia Cardiovascular: denies chest pain Pulmonary: denies cough, denies SOB Abdomen: endorses good appetite, denies constipation, endorses continence of bowel, incontinent of bladder GU: denies dysuria, urinary frequency MSK:  denies weakness,  no falls reported Skin: denies rash/wound Neurological: denies pain, denies insomnia Psych: Endorses positive mood Heme/lymph/immuno: denies bruises, abnormal bleeding  Physical Exam: Height/Weight5 feet 6 inches 127 Ibs Constitutional: NAD General: Well groomed EYES: anicteric sclera, lids intact, no discharge  ENMT: Moist mucous membrane CV: S1 S2, RRR, no LE edema Pulmonary: LCTA, no cough, Abdomen: active BS + 4 quadrants, soft and non tender, no ascites GU: no suprapubic tenderness MSK: moderate sarcopenia, moves all extremities, ambulatory with a cane Skin: warm and dry, no rashes or wounds on visible skin Neuro:  weakness, otherwise non focal, forgetful, confused Psych: non-anxious affect Hem/lymph/immuno: no widespread bruising   CURRENT PROBLEM LIST:  Patient Active Problem List   Diagnosis Date Noted  . Non-small cell carcinoma of right lung, stage 1 ([place]) 06/08/2020  . Pulmonary nodule 1 cm or greater in diameter 05/21/2020  . Atherosclerotic heart disease of native coronary artery with other forms of angina pectoris ([place]) 04/22/2020  . Fatigue 02/18/2019  . Memory loss 02/18/2019  . Vitamin B12 deficiency 10/23/2018  . Hypotension 10/23/2018  .  Stage 3 chronic kidney disease ([place]) 06/03/2018  . Chronic anemia 06/03/2018  . Hypercholesterolemia 06/03/2018  . Malnutrition of moderate degree 07/12/2017  . Hypoalbuminemia 07/11/2017  . Elevated LFTs 07/11/2017  . Closed fracture of left distal radius 05/28/2017  . Snoring 12/21/2016  . OSA and COPD overlap syndrome ([place]) 12/21/2016  . Peripheral musculoskeletal gait disorder 02/05/2015  . Lumbar post-laminectomy syndrome 02/05/2015  . CAD in native artery  10/16/2014  . NSTEMI (non-ST elevated myocardial infarction) ([place]) 09/13/2014  . Cardiomyopathy, ischemic 09/13/2014  . Chronic systolic heart failure ([place])   . Tobacco abuse 09/12/2014  . Dyslipidemia 09/12/2014  . CKD (chronic kidney disease), stage II 09/12/2014  . Normocytic anemia 09/12/2014  . Chronic diastolic heart failure, NYHA class 1 ([place]) 09/12/2014  . Postlaminectomy syndrome, cervical region 08/01/2013  . Chronic midline low back pain with left-sided sciatica 08/01/2013  . Shoulder joint contracture 08/01/2013  . HTN (hypertension) 03/03/2013  . Abnormal genetic test 03/03/2013   PAST MEDICAL HISTORY:  Active Ambulatory Problems    Diagnosis Date Noted  . HTN (hypertension) 03/03/2013  . Abnormal genetic test 03/03/2013  . Postlaminectomy syndrome, cervical region 08/01/2013  . Chronic midline low back pain with left-sided sciatica 08/01/2013  . Shoulder joint contracture 08/01/2013  . Tobacco abuse 09/12/2014  . Dyslipidemia 09/12/2014  . CKD (chronic kidney disease), stage II 09/12/2014  . Normocytic anemia 09/12/2014  . Chronic diastolic heart failure, NYHA class 1 ([place]) 09/12/2014  . NSTEMI (non-ST elevated myocardial infarction) ([place]) 09/13/2014  . Cardiomyopathy, ischemic 09/13/2014  . Chronic systolic heart failure ([place])   . CAD in native artery 10/16/2014  . Peripheral musculoskeletal gait disorder 02/05/2015  . Lumbar post-laminectomy syndrome 02/05/2015  . Snoring 12/21/2016  . OSA and COPD overlap syndrome ([place]) 12/21/2016  . Hypoalbuminemia 07/11/2017  . Elevated LFTs 07/11/2017  . Malnutrition of moderate degree 07/12/2017  . Stage 3 chronic kidney disease ([place]) 06/03/2018  . Chronic anemia 06/03/2018  . Hypercholesterolemia 06/03/2018  . Vitamin B12 deficiency 10/23/2018  . Hypotension 10/23/2018  . Closed fracture of left distal radius 05/28/2017  . Fatigue 02/18/2019  . Memory loss 02/18/2019  . Atherosclerotic heart disease of native  coronary artery with other forms of angina pectoris ([place]) 04/22/2020  . Pulmonary nodule 1 cm or greater in diameter 05/21/2020  . Non-small cell carcinoma of right lung, stage 1 ([place]) 06/08/2020   Resolved Ambulatory Problems    Diagnosis Date Noted  . Sacroiliac dysfunction 10/10/2013  . Chest pain 09/12/2014  . Gait disorder 02/05/2015  . Shortness of breath at rest 12/21/2016  . Bilateral lower extremity edema 12/21/2016  . ARF (acute renal failure) ([place]) 07/11/2017  . Rhabdomyolysis 07/11/2017  . Melena 07/11/2017  . Pressure injury of skin 07/18/2017  . AKI (acute kidney injury) ([place]) 07/18/2017   Past Medical History:  Diagnosis Date  . Acute kidney injury ([place])   . Back pain   . Cervical cancer ([place])   . CHF (congestive heart failure) ([place])   . COPD (chronic obstructive pulmonary disease) ([place])   . Coronary artery disease   . GERD (gastroesophageal reflux disease)   . Hyperlipidemia   . Hypertension   . Mild cognitive impairment   . Myocardial infarction ([place])   . Neck pain   . Stomach problems    SOCIAL HX:  Social History   Tobacco Use  . Smoking status: Current Every Day Smoker    Packs/day: 0.50    Years: 60.00    Pack years:  30.00  . Smokeless tobacco: Never Used  . Tobacco comment: currently smokes 1/2 ppd or less  Substance Use Topics  . Alcohol use: No   FAMILY HX:  Family History  Problem Relation Age of Onset  . Heart disease Mother        also HTN  . Diabetes Mother   . Colon cancer Mother   . Heart disease Brother        deceased at 52  . Hypertension Brother   . Heart attack Brother   . Heart disease Brother   . Hypertension Brother       ALLERGIES:  Allergies  Allergen Reactions  . Buprenorphine Hcl Shortness Of Breath    Throat swelling/trouble breathing and lethargic  . Morphine And Related Shortness Of Breath    Throat swelling/trouble breathing and lethargic  . Celebrex [Celecoxib] Diarrhea and Swelling    Swelling of legs    . Vioxx [Rofecoxib] Palpitations  . Codeine Other (See Comments)    Bloated      PERTINENT MEDICATIONS:  Outpatient Encounter Medications as of 08/26/2020  Medication Sig  . aspirin EC 81 MG tablet Take 81 mg by mouth daily.  . Calcium Citrate-Vitamin D (CALCIUM CITRATE+D3 PETITES PO) Take 1 tablet by mouth daily.  . cholecalciferol (VITAMIN D) 25 MCG (1000 UNIT) tablet Take 1,000 Units by mouth daily.  . ciclopirox (PENLAC) 8 % solution Apply 1 application topically at bedtime. Apply over nail and surrounding skin. Apply daily over previous coat. After seven (7) days, file nail and continue cycle.  . CVS MAGNESIUM OXIDE 250 MG TABS TAKE 1 TABLET BY MOUTH WITH EVENING MEAL DAILY  . donepezil (ARICEPT) 10 MG tablet Take 1 tablet (10 mg total) by mouth at bedtime.  . ferrous sulfate 325 (65 FE) MG tablet Take 325 mg by mouth daily with breakfast.  . furosemide (LASIX) 20 MG tablet TAKE 1 TABLET BY MOUTH EVERY OTHER DAY  . gabapentin (NEURONTIN) 100 MG capsule Take 1 capsule (100 mg total) by mouth 2 (two) times daily. (Patient not taking: No sig reported)  . Magnesium 250 MG TABS Take 250 mg by mouth every evening.  . metoprolol tartrate (LOPRESSOR) 25 MG tablet TAKE 1 TABLET BY MOUTH TWICE A DAY  . Multiple Vitamin (MULTIVITAMIN WITH MINERALS) TABS tablet Take 1 tablet by mouth daily.  . nitroGLYCERIN (NITROSTAT) 0.4 MG SL tablet PLACE ONE TABLET UNDER THE TONGUE EVERY 5 MINUTES AS NEEDED FOR CHEST PAIN.  Marland Kitchen oxybutynin (DITROPAN) 5 MG tablet TAKE 1 TABLET BY MOUTH TWICE A DAY (Patient taking differently: Take 5 mg by mouth 2 (two) times daily.)  . pantoprazole (PROTONIX) 40 MG tablet Take 1 tablet (40 mg total) by mouth 2 (two) times daily.  . polyethylene glycol (MIRALAX / GLYCOLAX) packet Take 17 g by mouth daily as needed for mild constipation.  (Patient not taking: No sig reported)  . Potassium Chloride ER 20 MEQ TBCR TAKE 2 TABLETS BY MOUTH EVERY MORNING (Patient taking differently: Take  40 mEq by mouth daily.)  . QUEtiapine (SEROQUEL) 25 MG tablet Take 1 tablet (25 mg total) by mouth at bedtime.  . rosuvastatin (CRESTOR) 40 MG tablet Take 1 tablet (40 mg total) by mouth every evening.  . SYMBICORT 160-4.5 MCG/ACT inhaler TAKE 2 PUFFS BY MOUTH TWICE A DAY  . traMADol (ULTRAM) 50 MG tablet TAKE 1 TABLET 3 TIMES A DAY AND CAN TAKE 1 EXTRA 20 DAYS/MONTH   No facility-administered encounter medications on file as of  08/26/2020.   This section of visit is  coded based on medical decision making (MDM)  Thank you for the opportunity to participate in the care of Ms. Buntyn.  The palliative care team will continue to follow. Please call our office at [phone] if we can be of additional assistance.   Note: Portions of this note were generated with [employer]. Dictation errors may occur despite best attempts at proofreading.  Teodoro Spray, NP

## 2020-08-27 ENCOUNTER — Other Ambulatory Visit: Payer: Self-pay | Admitting: Nurse Practitioner

## 2020-08-27 DIAGNOSIS — G3184 Mild cognitive impairment, so stated: Secondary | ICD-10-CM

## 2020-08-31 ENCOUNTER — Telehealth: Payer: Medicare Other

## 2020-08-31 ENCOUNTER — Ambulatory Visit: Payer: Self-pay

## 2020-08-31 DIAGNOSIS — N1831 Chronic kidney disease, stage 3a: Secondary | ICD-10-CM

## 2020-08-31 DIAGNOSIS — I5022 Chronic systolic (congestive) heart failure: Secondary | ICD-10-CM

## 2020-08-31 DIAGNOSIS — I10 Essential (primary) hypertension: Secondary | ICD-10-CM | POA: Diagnosis not present

## 2020-08-31 DIAGNOSIS — I25118 Atherosclerotic heart disease of native coronary artery with other forms of angina pectoris: Secondary | ICD-10-CM | POA: Diagnosis not present

## 2020-08-31 DIAGNOSIS — R911 Solitary pulmonary nodule: Secondary | ICD-10-CM

## 2020-08-31 DIAGNOSIS — IMO0001 Reserved for inherently not codable concepts without codable children: Secondary | ICD-10-CM

## 2020-08-31 NOTE — Patient Instructions (Signed)
Goals Addressed    . Follow My Treatment Plan-Chronic Kidney   On track    Timeframe:  Long-Range Goal Priority:  Medium Start Date: 07/26/20                           Expected End Date:  01/26/21                  Follow Up Date: 11/10/20   - call for medicine refill 2 or 3 days before it runs out - call the doctor or nurse before I stop taking medicine - call the doctor or nurse to get help with side effects - keep follow-up appointments - set alarm to remind of prescription refill date(s) - set alarm to remind to take medications    Why is this important?    Staying as healthy as you can is very important. This may mean making changes if you smoke, don't exercise or eat poorly.   A healthy lifestyle is an important goal for you.   Following the treatment plan and making changes may be hard.   Try some of these steps to help keep the disease from getting worse.     Notes:     . Optimal Cognitive Function   On track    Timeframe:  Long-Range Goal Priority:  Medium Start Date: 07/26/20                             Expected End Date: 01/26/21                     Next Follow Up date: 11/10/20  Patient Goals/Self-Care Activities:   - Maintain Optimal Cognitive Function  - Follow up with PCP for evatuation of change in cognitive function and behavior     . Understand treatment plan for new Cancer diagnosis   On track    Timeframe:  Long-Range Goal Priority:  High Start Date: 06/15/20                            Expected End Date:  12/13/20  Follow up date: 11/10/20  Complete scheduled MRI and PET scans as directed Keep all MD follow up appointments as scheduled Call your Oncology navigator for questions related to Cancer diagnosis and next steps Continue to work with PCP and CCM team for disease education and support and or for assistance with care coordination as needed

## 2020-08-31 NOTE — Chronic Care Management (AMB) (Signed)
Chronic Care Management   CCM RN Visit Note  08/31/2020 Name: Sara Fox MRN: [id number] DOB: 1940-12-15  Subjective: Sara Fox is a 80 y.o. year old female who is a primary care patient of [name], [place]. The care management team was consulted for assistance with disease management and care coordination needs.    Engaged with patient by telephone for follow up visit in response to provider referral for case management and/or care coordination services.   Consent to Services:  The patient was given information about Chronic Care Management services, agreed to services, and gave verbal consent prior to initiation of services.  Please see initial visit note for detailed documentation.   Patient agreed to services and verbal consent obtained.   Assessment: Review of patient past medical history, allergies, medications, health status, including review of consultants reports, laboratory and other test data, was performed as part of comprehensive evaluation and provision of chronic care management services.   SDOH (Social Determinants of Health) assessments and interventions performed:  Yes, no acute challenges identified   CCM Care Plan  Allergies  Allergen Reactions  . Buprenorphine Hcl Shortness Of Breath    Throat swelling/trouble breathing and lethargic  . Morphine And Related Shortness Of Breath    Throat swelling/trouble breathing and lethargic  . Celebrex [Celecoxib] Diarrhea and Swelling    Swelling of legs   . Vioxx [Rofecoxib] Palpitations  . Codeine Other (See Comments)    Bloated     Outpatient Encounter Medications as of 08/31/2020  Medication Sig Note  . aspirin EC 81 MG tablet Take 81 mg by mouth daily. 05/24/2020: On hold due to upcoming procedure.  . Calcium Citrate-Vitamin D (CALCIUM CITRATE+D3 PETITES PO) Take 1 tablet by mouth daily.   . cholecalciferol (VITAMIN D) 25 MCG (1000 UNIT) tablet Take 1,000 Units by mouth daily.   . ciclopirox (PENLAC) 8 %  solution Apply 1 application topically at bedtime. Apply over nail and surrounding skin. Apply daily over previous coat. After seven (7) days, file nail and continue cycle.   . [employer] MAGNESIUM OXIDE 250 MG TABS TAKE 1 TABLET BY MOUTH WITH EVENING MEAL DAILY   . donepezil (ARICEPT) 10 MG tablet Take 1 tablet (10 mg total) by mouth at bedtime.   . ferrous sulfate 325 (65 FE) MG tablet Take 325 mg by mouth daily with breakfast.   . furosemide (LASIX) 20 MG tablet TAKE 1 TABLET BY MOUTH EVERY OTHER DAY   . gabapentin (NEURONTIN) 100 MG capsule Take 1 capsule (100 mg total) by mouth 2 (two) times daily. (Patient not taking: No sig reported)   . Magnesium 250 MG TABS Take 250 mg by mouth every evening.   . metoprolol tartrate (LOPRESSOR) 25 MG tablet TAKE 1 TABLET BY MOUTH TWICE A DAY   . Multiple Vitamin (MULTIVITAMIN WITH MINERALS) TABS tablet Take 1 tablet by mouth daily.   . nitroGLYCERIN (NITROSTAT) 0.4 MG SL tablet PLACE ONE TABLET UNDER THE TONGUE EVERY 5 MINUTES AS NEEDED FOR CHEST PAIN.   Marland Kitchen oxybutynin (DITROPAN) 5 MG tablet TAKE 1 TABLET BY MOUTH TWICE A DAY (Patient taking differently: Take 5 mg by mouth 2 (two) times daily.)   . pantoprazole (PROTONIX) 40 MG tablet Take 1 tablet (40 mg total) by mouth 2 (two) times daily.   . polyethylene glycol (MIRALAX / GLYCOLAX) packet Take 17 g by mouth daily as needed for mild constipation.  (Patient not taking: No sig reported)   . Potassium Chloride ER 20  MEQ TBCR TAKE 2 TABLETS BY MOUTH EVERY MORNING (Patient taking differently: Take 40 mEq by mouth daily.)   . QUEtiapine (SEROQUEL) 25 MG tablet TAKE 1 TABLET BY MOUTH EVERYDAY AT BEDTIME   . rosuvastatin (CRESTOR) 40 MG tablet Take 1 tablet (40 mg total) by mouth every evening.   . SYMBICORT 160-4.5 MCG/ACT inhaler TAKE 2 PUFFS BY MOUTH TWICE A DAY   . traMADol (ULTRAM) 50 MG tablet TAKE 1 TABLET 3 TIMES A DAY AND CAN TAKE 1 EXTRA 20 DAYS/MONTH 07/05/2020: Last filled on 06/25/2020 for # 110  On hand  today # 54 plus some at home per Son Last taken 07/04/2020   No facility-administered encounter medications on file as of 08/31/2020.    Patient Active Problem List   Diagnosis Date Noted  . Non-small cell carcinoma of right lung, stage 1 ([place]) 06/08/2020  . Pulmonary nodule 1 cm or greater in diameter 05/21/2020  . Atherosclerotic heart disease of native coronary artery with other forms of angina pectoris ([place]) 04/22/2020  . Fatigue 02/18/2019  . Memory loss 02/18/2019  . Vitamin B12 deficiency 10/23/2018  . Hypotension 10/23/2018  . Stage 3 chronic kidney disease ([place]) 06/03/2018  . Chronic anemia 06/03/2018  . Hypercholesterolemia 06/03/2018  . Malnutrition of moderate degree 07/12/2017  . Hypoalbuminemia 07/11/2017  . Elevated LFTs 07/11/2017  . Closed fracture of left distal radius 05/28/2017  . Snoring 12/21/2016  . OSA and COPD overlap syndrome ([place]) 12/21/2016  . Peripheral musculoskeletal gait disorder 02/05/2015  . Lumbar post-laminectomy syndrome 02/05/2015  . CAD in native artery 10/16/2014  . NSTEMI (non-ST elevated myocardial infarction) ([place]) 09/13/2014  . Cardiomyopathy, ischemic 09/13/2014  . Chronic systolic heart failure ([place])   . Tobacco abuse 09/12/2014  . Dyslipidemia 09/12/2014  . CKD (chronic kidney disease), stage II 09/12/2014  . Normocytic anemia 09/12/2014  . Chronic diastolic heart failure, NYHA class 1 ([place]) 09/12/2014  . Postlaminectomy syndrome, cervical region 08/01/2013  . Chronic midline low back pain with left-sided sciatica 08/01/2013  . Shoulder joint contracture 08/01/2013  . HTN (hypertension) 03/03/2013  . Abnormal genetic test 03/03/2013    Conditions to be addressed/monitored:Stage 3a chronic kidney disease, HTN, chronic systolic heart failure, Mild Cognitive Impairment, Atherosclerotic heart disease of native coronary artery with other forms of angina pectoris, Lung Nodule    Care Plan : Cancer Treatment Phase (Adult)  Updates  made by Lynne Logan, RN since 08/31/2020 12:00 AM    Problem: Psychosocial Response to Cancer Diagnosis   Priority: High    Long-Range Goal: Optimal Coping   Start Date: 06/15/2020  Expected End Date: 12/13/2020  Recent Progress: On track  Priority: High  Note:   Current Barriers:   Ineffective Self Health Maintenance  Lacks social connections  Cognitive deficits   Currently UNABLE TO independently self manage needs related to chronic health conditions.   Knowledge Deficits related to short term plan for care coordination needs and long term plans for chronic disease management needs Clinical Goal(s):  Marland Kitchen Collaboration with [name], FNP regarding development and update of comprehensive plan of care as evidenced by provider attestation and co-signature . Inter-disciplinary care team collaboration (see longitudinal plan of care)  Over the next 180 days, patient will work with care management team to address care coordination and chronic disease management needs related to Disease Management  Educational Needs  Care Coordination  Medication Management and Education  Medication Reconciliation  Psychosocial Support  Dementia and Caregiver Support   Interventions:  08/31/20  completed successful outbound call with son [name]  Evaluation of current treatment plan related to Dementia and patient's adherence to plan as established by provider.  Collaboration with [name], FNP regarding development and update of comprehensive plan of care as evidenced by provider attestation       and co-signature  Inter-disciplinary care team collaboration (see longitudinal plan of care)  Determined patient's mood and cognition have improved and patient agreed to complete her radiation treatments as scheduled  Determined son [name] feels her condition is currently stable and he continues to keep her actively engaged in conversation and try's to include her in her plan of  care  Discussed and reviewed upcoming appointment for Radiology/Oncology follow up with Dr. Sondra Come scheduled for 09/20/20 @11 :15 AM; follow up with Oncologist, Dr. Julien Nordmann scheduled for 11/01/20 @9 :15 AM   Discussed plans with patient for ongoing care management follow up and provided patient with direct contact information for care management team Patient Goals/Self Care Activities:  . Keep all MD follow up appointments as scheduled . Call your Oncology navigator for questions related to Cancer diagnosis and next steps . Continue to work with PCP and CCM team for disease education and support and or for assistance with care coordination as needed   Follow Up Plan: Telephone follow up appointment with care management team member scheduled for: 11/10/20    Care Plan : Chronic Kidney (Adult)  Updates made by Lynne Logan, RN since 08/31/2020 12:00 AM    Problem: Disease Progression   Priority: Medium    Long-Range Goal: Disease Progression Prevented or Minimized   Start Date: 07/26/2020  Expected End Date: 01/26/2021  Recent Progress: On track  Priority: Medium  Note:   Current Barriers:   Ineffective Self Health Maintenance  Unable to self administer medications as prescribed  Unable to perform ADLs independently  Unable to perform IADLs independently  Cognitive deficit  Clinical Goal(s):  Marland Kitchen Collaboration with [name], FNP regarding development and update of comprehensive plan of care as evidenced by provider attestation and co-signature . Inter-disciplinary care team collaboration (see longitudinal plan of care)  patient will work with care management team to address care coordination and chronic disease management needs related to Disease Management  Educational Needs  Care Coordination  Medication Management and Education  Psychosocial Support  Dementia and Caregiver Support   Interventions:   Evaluation of current treatment plan related to CKD Stage III and  patient's adherence to plan as established by provider.  Collaboration with [name], FNP regarding development and update of comprehensive plan of care as evidenced by provider attestation       and co-signature  Inter-disciplinary care team collaboration (see longitudinal plan of care)  Reviewed and discussed current GFR and educated on stages of renal disease  Educated on importance of drinking plenty of water, at least 64 oz per day unless otherwise directed   Educated on importance of taking all medications exactly as prescribed and to keep all MD follow up appointments   Discussed plans with patient for ongoing care management follow up and provided patient with direct contact information for care management team Self Care Activities:  . Continue to keep all MD follow up appointments for ongoing monitoring of renal fuction . Continue to take all medications exactly as prescribed  . Continue to call the pharmacy for refills at least 7 days before taking last dose . Notify the CCM team and or PCP if you are unable to afford your medication  refills . Drink plenty of water (64 oz daily) unless otherwise directed  . Call the CCM team and or PCP for questions or concerns  Patient Goals: - to maintain/improve renal function  Follow Up Plan: Telephone follow up appointment with care management team member scheduled for:  11/10/20   Care Plan : Dementia (Adult)  Updates made by Lynne Logan, RN since 08/31/2020 12:00 AM    Problem: Cognitive Function   Priority: Medium    Long-Range Goal: Optimal Cognitive Function   Start Date: 07/26/2020  Expected End Date: 01/26/2021  Recent Progress: On track  Priority: Medium  Note:   Current Barriers:   Ineffective Self Health Maintenance  Unable to self administer medications as prescribed  Unable to perform ADLs independently  Unable to perform IADLs independently  Cognitive deficit  Clinical Goal(s):  Marland Kitchen Collaboration with  [name], FNP regarding development and update of comprehensive plan of care as evidenced by provider attestation and co-signature . Inter-disciplinary care team collaboration (see longitudinal plan of care)  patient will work with care management team to address care coordination and chronic disease management needs related to Disease Management  Educational Needs  Care Coordination  Medication Management and Education  Psychosocial Support  Dementia and Caregiver Support   Interventions:  08/31/20 completed successful call with son [name]  Evaluation of current treatment plan related to Dementia and patient's adherence to plan as established by provider.  Collaboration with [name], FNP regarding development and update of comprehensive plan of care as evidenced by provider attestation       and co-signature  Inter-disciplinary care team collaboration (see longitudinal plan of care)  Discussed and reviewed recent f/u with PCP for evaluation or patient's worsening dementia with behavior changes  Determined patient's mood and cognition have improved and she agreed to complete her radiation treatments as scheduled  Determined son [name] feels her condition is currently stable and he continues to keep her actively engaged in conversation and try's to include her in her plan of care  Instructed [name] to keep the PCP well informed of new or worsening symptoms related to patient's dementia and or behavior changes  Discussed plans with patient for ongoing care management follow up and provided patient with direct contact information for care management team Self Care Activities:  . Continue to keep all MD follow up appointments  . Continue to take all medications exactly as prescribed  . Continue to call the pharmacy for refills at least 7 days before taking last dose . Notify the CCM team and or PCP if you are unable to afford your medication refills . Call the CCM team and  or PCP for questions or concerns  Patient Goals: - Maintain Optimal Cognitive Function   Follow Up Plan: Telephone follow up appointment with care management team member scheduled for: 11/10/20    Plan:Telephone follow up appointment with care management team member scheduled for:  11/10/20  Barb Merino, RN, BSN, CCM Care Management Coordinator [facility]  Direct Phone: [phone]

## 2020-09-01 ENCOUNTER — Other Ambulatory Visit: Payer: Self-pay | Admitting: Physical Medicine & Rehabilitation

## 2020-09-03 ENCOUNTER — Ambulatory Visit: Payer: Medicare Other

## 2020-09-03 DIAGNOSIS — I5022 Chronic systolic (congestive) heart failure: Secondary | ICD-10-CM

## 2020-09-03 DIAGNOSIS — G3184 Mild cognitive impairment, so stated: Secondary | ICD-10-CM

## 2020-09-03 DIAGNOSIS — I10 Essential (primary) hypertension: Secondary | ICD-10-CM

## 2020-09-03 DIAGNOSIS — N1831 Chronic kidney disease, stage 3a: Secondary | ICD-10-CM

## 2020-09-03 NOTE — Patient Instructions (Signed)
Social Worker Visit Information  Goals we discussed today:  Goals Addressed            This Visit's Progress   . Home and Family Safety Maintained   On track    Timeframe:  Short-Term Goal Priority:  High Start Date:   4.4.22                          Expected End Date: 5.4.22                      Next planned outreach: 5.6.22  Patient Goals/Self-Care Activities . Over the next 14 days, patient will: With the help of her son  - Patient will attend all scheduled provider appointments Patient will attend all scheduled provider appointments Engage with Palliative Care Contact DSS to determine status of Medicaid application         Materials Provided: Verbal education about caregiver resources provided by phone  Follow Up Plan: SW will follow up with patient by phone over the next two weeks   [name], BSW, CDP Social Worker, Certified Dementia Practitioner [place] [facility] [phone]

## 2020-09-03 NOTE — Chronic Care Management (AMB) (Signed)
Chronic Care Management    Social Work Note  09/03/2020 Name: Sara Fox MRN: [id number] DOB: 07-Mar-1941  Sara Fox is a 80 y.o. year old female who is a primary care patient of [name], [place]. The CCM team was consulted to assist the patient with chronic disease management and/or care coordination needs related to: [employer] .   Engaged with patient son by phone for follow up visit in response to provider referral for social work chronic care management and care coordination services.   Consent to Services:  The patient was given information about Chronic Care Management services, agreed to services, and gave verbal consent prior to initiation of services.  Please see initial visit note for detailed documentation.   Patient agreed to services and consent obtained.   Assessment: Review of patient past medical history, allergies, medications, and health status, including review of relevant consultants reports was performed today as part of a comprehensive evaluation and provision of chronic care management and care coordination services.     SDOH (Social Determinants of Health) assessments and interventions performed:    Advanced Directives Status: Not addressed in this encounter.  CCM Care Plan  Allergies  Allergen Reactions  . Buprenorphine Hcl Shortness Of Breath    Throat swelling/trouble breathing and lethargic  . Morphine And Related Shortness Of Breath    Throat swelling/trouble breathing and lethargic  . Celebrex [Celecoxib] Diarrhea and Swelling    Swelling of legs   . Vioxx [Rofecoxib] Palpitations  . Codeine Other (See Comments)    Bloated     Outpatient Encounter Medications as of 09/03/2020  Medication Sig Note  . aspirin EC 81 MG tablet Take 81 mg by mouth daily. 05/24/2020: On hold due to upcoming procedure.  . Calcium Citrate-Vitamin D (CALCIUM CITRATE+D3 PETITES PO) Take 1 tablet by mouth daily.   . cholecalciferol (VITAMIN D) 25 MCG (1000  UNIT) tablet Take 1,000 Units by mouth daily.   . ciclopirox (PENLAC) 8 % solution Apply 1 application topically at bedtime. Apply over nail and surrounding skin. Apply daily over previous coat. After seven (7) days, file nail and continue cycle.   . [employer] MAGNESIUM OXIDE 250 MG TABS TAKE 1 TABLET BY MOUTH WITH EVENING MEAL DAILY   . donepezil (ARICEPT) 10 MG tablet Take 1 tablet (10 mg total) by mouth at bedtime.   . ferrous sulfate 325 (65 FE) MG tablet Take 325 mg by mouth daily with breakfast.   . furosemide (LASIX) 20 MG tablet TAKE 1 TABLET BY MOUTH EVERY OTHER DAY   . gabapentin (NEURONTIN) 100 MG capsule Take 1 capsule (100 mg total) by mouth 2 (two) times daily. (Patient not taking: No sig reported)   . Magnesium 250 MG TABS Take 250 mg by mouth every evening.   . metoprolol tartrate (LOPRESSOR) 25 MG tablet TAKE 1 TABLET BY MOUTH TWICE A DAY   . Multiple Vitamin (MULTIVITAMIN WITH MINERALS) TABS tablet Take 1 tablet by mouth daily.   . nitroGLYCERIN (NITROSTAT) 0.4 MG SL tablet PLACE ONE TABLET UNDER THE TONGUE EVERY 5 MINUTES AS NEEDED FOR CHEST PAIN.   Marland Kitchen oxybutynin (DITROPAN) 5 MG tablet TAKE 1 TABLET BY MOUTH TWICE A DAY (Patient taking differently: Take 5 mg by mouth 2 (two) times daily.)   . pantoprazole (PROTONIX) 40 MG tablet Take 1 tablet (40 mg total) by mouth 2 (two) times daily.   . polyethylene glycol (MIRALAX / GLYCOLAX) packet Take 17 g by mouth daily as needed for  mild constipation.  (Patient not taking: No sig reported)   . Potassium Chloride ER 20 MEQ TBCR TAKE 2 TABLETS BY MOUTH EVERY MORNING (Patient taking differently: Take 40 mEq by mouth daily.)   . QUEtiapine (SEROQUEL) 25 MG tablet TAKE 1 TABLET BY MOUTH EVERYDAY AT BEDTIME   . rosuvastatin (CRESTOR) 40 MG tablet Take 1 tablet (40 mg total) by mouth every evening.   . SYMBICORT 160-4.5 MCG/ACT inhaler TAKE 2 PUFFS BY MOUTH TWICE A DAY   . traMADol (ULTRAM) 50 MG tablet TAKE 1 TABLET 3 TIMES A DAY AND CAN TAKE 1 EXTRA  20 DAYS/MONTH    No facility-administered encounter medications on file as of 09/03/2020.    Patient Active Problem List   Diagnosis Date Noted  . Non-small cell carcinoma of right lung, stage 1 ([place]) 06/08/2020  . Pulmonary nodule 1 cm or greater in diameter 05/21/2020  . Atherosclerotic heart disease of native coronary artery with other forms of angina pectoris ([place]) 04/22/2020  . Fatigue 02/18/2019  . Memory loss 02/18/2019  . Vitamin B12 deficiency 10/23/2018  . Hypotension 10/23/2018  . Stage 3 chronic kidney disease ([place]) 06/03/2018  . Chronic anemia 06/03/2018  . Hypercholesterolemia 06/03/2018  . Malnutrition of moderate degree 07/12/2017  . Hypoalbuminemia 07/11/2017  . Elevated LFTs 07/11/2017  . Closed fracture of left distal radius 05/28/2017  . Snoring 12/21/2016  . OSA and COPD overlap syndrome ([place]) 12/21/2016  . Peripheral musculoskeletal gait disorder 02/05/2015  . Lumbar post-laminectomy syndrome 02/05/2015  . CAD in native artery 10/16/2014  . NSTEMI (non-ST elevated myocardial infarction) ([place]) 09/13/2014  . Cardiomyopathy, ischemic 09/13/2014  . Chronic systolic heart failure ([place])   . Tobacco abuse 09/12/2014  . Dyslipidemia 09/12/2014  . CKD (chronic kidney disease), stage II 09/12/2014  . Normocytic anemia 09/12/2014  . Chronic diastolic heart failure, NYHA class 1 ([place]) 09/12/2014  . Postlaminectomy syndrome, cervical region 08/01/2013  . Chronic midline low back pain with left-sided sciatica 08/01/2013  . Shoulder joint contracture 08/01/2013  . HTN (hypertension) 03/03/2013  . Abnormal genetic test 03/03/2013    Conditions to be addressed/monitored: CHF, HTN, CKD Stage III and Cognitive Impairment; Limited access to caregiver  Care Plan : Social Work [place]  Updates made by [name] since 09/03/2020 12:00 AM    Problem: Home and Family Safety     Goal: Home and Family Safety Maintained   Start Date: 08/16/2020  Expected End Date:  09/15/2020  Priority: High  Note:   Current Barriers:  . Chronic disease management support and education needs related to CHF, HTN, CKD Stage III, and Mild Cognitive Impairment   . Limited access to caregiver  Social Worker Clinical Goal(s):  Marland Kitchen Over the next 30 days, patient and her family will work with SW to identify resources to obtain home and family safety  CCM SW Interventions:  . Inter-disciplinary care team collaboration (see longitudinal plan of care) . Collaboration with [name], FNP regarding development and update of comprehensive plan of care as evidenced by provider attestation and co-signature . Successful outbound call placed to the patients son [name] to assist with care coordination needs . Discussed the patient has help in the home from the sons girl friend but he is interested in alternative options . Determined he did apply for Medicaid on the patients behalf a few weeks ago but has yet to receive a determination letter . Unable to identify exact time frame he completed the application . [name] to  contact [place] to follow up on the status of patients application . Verbal education provided regarding alternative resource options such as [place] [place] in home aid program and [facility] . Scheduled follow up call over the next two weeks  Patient Goals/Self-Care Activities . Over the next 14 days, patient will: With the help of her son  - Patient will attend all scheduled provider appointments Engage with Palliative Care Contact DSS to determine status of Medicaid application Contact SW as needed prior to next scheduled call  Follow Up Plan:  SW will follow up with the patient and her family over the next 14 days       Follow Up Plan: SW will follow up with patient by phone over the next two weeks.      [name], BSW, CDP Social Worker, Certified Dementia Practitioner [place] [facility] [place] [facility] [phone]  Total time  spent performing care coordination and/or care management activities with the patient by phone or face to face = 15 minutes.

## 2020-09-06 ENCOUNTER — Telehealth: Payer: Self-pay

## 2020-09-06 NOTE — Chronic Care Management (AMB) (Signed)
Chronic Care Management Pharmacy Assistant   Name: Sara Fox  MRN: [id number] DOB: 02-08-1941   Reason for Encounter: Appointment Reminder Call   Recent office visits:  08/17/20-Janece Laurance [place]atten, [place] (PCP) 08/03/20- [name], FNP(PCP)  Recent consult visits:  None noted  Hospital visits:  Medication Reconciliation was completed by comparing discharge summary, patient's EMR and Pharmacy list, and upon discussion with patient.  Admitted to the hospital on 08/09/20 due to Agitation. Discharge date was 08/09/20. Discharged from [facility]?Medications Started at [facility] Discharge:?? -started None noted  Medication Changes at Hospital Discharge: -Changed None noted  Medications Discontinued at Hospital Discharge: -Stopped None noted  Medications that remain the same after Hospital Discharge:??  -All other medications will remain the same.    Medications: Outpatient Encounter Medications as of 09/06/2020  Medication Sig Note  . aspirin EC 81 MG tablet Take 81 mg by mouth daily. 05/24/2020: On hold due to upcoming procedure.  . Calcium Citrate-Vitamin D (CALCIUM CITRATE+D3 PETITES PO) Take 1 tablet by mouth daily.   . cholecalciferol (VITAMIN D) 25 MCG (1000 UNIT) tablet Take 1,000 Units by mouth daily.   . ciclopirox (PENLAC) 8 % solution Apply 1 application topically at bedtime. Apply over nail and surrounding skin. Apply daily over previous coat. After seven (7) days, file nail and continue cycle.   . CVS MAGNESIUM OXIDE 250 MG TABS TAKE 1 TABLET BY MOUTH WITH EVENING MEAL DAILY   . donepezil (ARICEPT) 10 MG tablet Take 1 tablet (10 mg total) by mouth at bedtime.   . ferrous sulfate 325 (65 FE) MG tablet Take 325 mg by mouth daily with breakfast.   . furosemide (LASIX) 20 MG tablet TAKE 1 TABLET BY MOUTH EVERY OTHER DAY   . gabapentin (NEURONTIN) 100 MG capsule Take 1 capsule (100 mg total) by mouth 2 (two) times daily. (Patient not taking: No sig  reported)   . Magnesium 250 MG TABS Take 250 mg by mouth every evening.   . metoprolol tartrate (LOPRESSOR) 25 MG tablet TAKE 1 TABLET BY MOUTH TWICE A DAY   . Multiple Vitamin (MULTIVITAMIN WITH MINERALS) TABS tablet Take 1 tablet by mouth daily.   . nitroGLYCERIN (NITROSTAT) 0.4 MG SL tablet PLACE ONE TABLET UNDER THE TONGUE EVERY 5 MINUTES AS NEEDED FOR CHEST PAIN.   Marland Kitchen oxybutynin (DITROPAN) 5 MG tablet TAKE 1 TABLET BY MOUTH TWICE A DAY (Patient taking differently: Take 5 mg by mouth 2 (two) times daily.)   . pantoprazole (PROTONIX) 40 MG tablet Take 1 tablet (40 mg total) by mouth 2 (two) times daily.   . polyethylene glycol (MIRALAX / GLYCOLAX) packet Take 17 g by mouth daily as needed for mild constipation.  (Patient not taking: No sig reported)   . Potassium Chloride ER 20 MEQ TBCR TAKE 2 TABLETS BY MOUTH EVERY MORNING (Patient taking differently: Take 40 mEq by mouth daily.)   . QUEtiapine (SEROQUEL) 25 MG tablet TAKE 1 TABLET BY MOUTH EVERYDAY AT BEDTIME   . rosuvastatin (CRESTOR) 40 MG tablet Take 1 tablet (40 mg total) by mouth every evening.   . SYMBICORT 160-4.5 MCG/ACT inhaler TAKE 2 PUFFS BY MOUTH TWICE A DAY   . traMADol (ULTRAM) 50 MG tablet TAKE 1 TABLET 3 TIMES A DAY AND CAN TAKE 1 EXTRA 20 DAYS/MONTH    No facility-administered encounter medications on file as of 09/06/2020.    Called and spoke with the patients son reminding him of her upcoming CCM  Call appointment on 09/07/20 at 9:00AM  with Orlando Penner, CPP. The patient was made aware to have medications and supplements nearby during phone call with CPP, Orlando Penner. Patients son verbalized understanding.   Lizbeth Bark Clinical Pharmacist Assistant [phone]

## 2020-09-07 ENCOUNTER — Telehealth: Payer: Medicare Other

## 2020-09-08 ENCOUNTER — Encounter: Payer: Self-pay | Admitting: Radiation Oncology

## 2020-09-09 ENCOUNTER — Other Ambulatory Visit: Payer: Self-pay

## 2020-09-09 ENCOUNTER — Encounter: Payer: Self-pay | Admitting: Sports Medicine

## 2020-09-09 ENCOUNTER — Ambulatory Visit: Payer: Medicare Other | Admitting: Sports Medicine

## 2020-09-09 DIAGNOSIS — B351 Tinea unguium: Secondary | ICD-10-CM | POA: Diagnosis not present

## 2020-09-09 DIAGNOSIS — M79674 Pain in right toe(s): Secondary | ICD-10-CM

## 2020-09-09 DIAGNOSIS — M79675 Pain in left toe(s): Secondary | ICD-10-CM

## 2020-09-09 DIAGNOSIS — L84 Corns and callosities: Secondary | ICD-10-CM

## 2020-09-09 DIAGNOSIS — R413 Other amnesia: Secondary | ICD-10-CM

## 2020-09-09 NOTE — Progress Notes (Signed)
Subjective: Sara Fox is a 80 y.o. female patient seen today in office with complaint of mildly painful corn and thickened and elongated toenails; unable to trim. Patient denies changes with medical history since last encounter. No other issues noted.  Patient Active Problem List   Diagnosis Date Noted  . Non-small cell carcinoma of right lung, stage 1 ([place]) 06/08/2020  . Pulmonary nodule 1 cm or greater in diameter 05/21/2020  . Atherosclerotic heart disease of native coronary artery with other forms of angina pectoris ([place]) 04/22/2020  . Fatigue 02/18/2019  . Memory loss 02/18/2019  . Vitamin B12 deficiency 10/23/2018  . Hypotension 10/23/2018  . Stage 3 chronic kidney disease ([place]) 06/03/2018  . Chronic anemia 06/03/2018  . Hypercholesterolemia 06/03/2018  . Malnutrition of moderate degree 07/12/2017  . Hypoalbuminemia 07/11/2017  . Elevated LFTs 07/11/2017  . Closed fracture of left distal radius 05/28/2017  . Snoring 12/21/2016  . OSA and COPD overlap syndrome ([place]) 12/21/2016  . Peripheral musculoskeletal gait disorder 02/05/2015  . Lumbar post-laminectomy syndrome 02/05/2015  . CAD in native artery 10/16/2014  . NSTEMI (non-ST elevated myocardial infarction) ([place]) 09/13/2014  . Cardiomyopathy, ischemic 09/13/2014  . Chronic systolic heart failure ([place])   . Tobacco abuse 09/12/2014  . Dyslipidemia 09/12/2014  . CKD (chronic kidney disease), stage II 09/12/2014  . Normocytic anemia 09/12/2014  . Chronic diastolic heart failure, NYHA class 1 ([place]) 09/12/2014  . Postlaminectomy syndrome, cervical region 08/01/2013  . Chronic midline low back pain with left-sided sciatica 08/01/2013  . Shoulder joint contracture 08/01/2013  . HTN (hypertension) 03/03/2013  . Abnormal genetic test 03/03/2013    Current Outpatient Medications on File Prior to Visit  Medication Sig Dispense Refill  . aspirin EC 81 MG tablet Take 81 mg by mouth daily.    . Calcium Citrate-Vitamin D  (CALCIUM CITRATE+D3 PETITES PO) Take 1 tablet by mouth daily.    . cholecalciferol (VITAMIN D) 25 MCG (1000 UNIT) tablet Take 1,000 Units by mouth daily.    . ciclopirox (PENLAC) 8 % solution Apply 1 application topically at bedtime. Apply over nail and surrounding skin. Apply daily over previous coat. After seven (7) days, file nail and continue cycle.    . CVS MAGNESIUM OXIDE 250 MG TABS TAKE 1 TABLET BY MOUTH WITH EVENING MEAL DAILY 90 tablet 1  . donepezil (ARICEPT) 10 MG tablet Take 1 tablet (10 mg total) by mouth at bedtime. 90 tablet 1  . ferrous sulfate 325 (65 FE) MG tablet Take 325 mg by mouth daily with breakfast.    . furosemide (LASIX) 20 MG tablet TAKE 1 TABLET BY MOUTH EVERY OTHER DAY 45 tablet 1  . gabapentin (NEURONTIN) 100 MG capsule Take 1 capsule (100 mg total) by mouth 2 (two) times daily. (Patient not taking: No sig reported)    . Magnesium 250 MG TABS Take 250 mg by mouth every evening.    . metoprolol tartrate (LOPRESSOR) 25 MG tablet TAKE 1 TABLET BY MOUTH TWICE A DAY 180 tablet 1  . Multiple Vitamin (MULTIVITAMIN WITH MINERALS) TABS tablet Take 1 tablet by mouth daily.    . nitroGLYCERIN (NITROSTAT) 0.4 MG SL tablet PLACE ONE TABLET UNDER THE TONGUE EVERY 5 MINUTES AS NEEDED FOR CHEST PAIN. 50 tablet 0  . oxybutynin (DITROPAN) 5 MG tablet TAKE 1 TABLET BY MOUTH TWICE A DAY (Patient taking differently: Take 5 mg by mouth 2 (two) times daily.) 180 tablet 1  . pantoprazole (PROTONIX) 40 MG tablet Take 1 tablet (40  mg total) by mouth 2 (two) times daily. 180 tablet 1  . polyethylene glycol (MIRALAX / GLYCOLAX) packet Take 17 g by mouth daily as needed for mild constipation.  (Patient not taking: No sig reported)    . Potassium Chloride ER 20 MEQ TBCR TAKE 2 TABLETS BY MOUTH EVERY MORNING (Patient taking differently: Take 40 mEq by mouth daily.) 180 tablet 3  . QUEtiapine (SEROQUEL) 25 MG tablet TAKE 1 TABLET BY MOUTH EVERYDAY AT BEDTIME 90 tablet 2  . rosuvastatin (CRESTOR) 40  MG tablet Take 1 tablet (40 mg total) by mouth every evening.    . SYMBICORT 160-4.5 MCG/ACT inhaler TAKE 2 PUFFS BY MOUTH TWICE A DAY 30.6 each 1  . traMADol (ULTRAM) 50 MG tablet TAKE 1 TABLET 3 TIMES A DAY AND CAN TAKE 1 EXTRA 20 DAYS/MONTH 110 tablet 2   No current facility-administered medications on file prior to visit.    Allergies  Allergen Reactions  . Buprenorphine Hcl Shortness Of Breath    Throat swelling/trouble breathing and lethargic  . Morphine And Related Shortness Of Breath    Throat swelling/trouble breathing and lethargic  . Celebrex [Celecoxib] Diarrhea and Swelling    Swelling of legs   . Vioxx [Rofecoxib] Palpitations  . Codeine Other (See Comments)    Bloated     Objective: Physical Exam  General: Well developed, nourished, no acute distress, awake, alert and oriented x 2   Vascular: Dorsalis pedis artery 2/4 bilateral, Posterior tibial artery 2/4 bilateral, skin temperature warm to warm proximal to distal bilateral lower extremities, no varicosities, pedal hair present bilateral.  Neurological: Gross sensation present via light touch bilateral.   Dermatological: Skin is warm, dry, and supple bilateral, Nails 1-10 are tender, long, thick, and discolored with mild subungal debris, no webspace macerations present bilateral, no open lesions present bilateral, + corn/hyperkeratotic tissue present right 3rd toe. No signs of infection bilateral.  Musculoskeletal: Asymptomatic hammertoe boney deformities noted bilateral. Muscular strength within normal limits without painon range of motion. No pain with calf compression bilateral.  Assessment and Plan:  Problem List Items Addressed This Visit      Other   Memory loss    Other Visit Diagnoses    Pain due to onychomycosis of toenails of both feet    -  Primary   Corns and callosities       Toe pain, bilateral         -Examined patient.  -Re-Discussed treatment options for painful mycotic  nails/corn. -Mechanically debrided and reduced mycotic nails with sterile nail nipper and dremel nail file without incident. -At no charge trimmed corn at right 3rd toe distal tuft -Continue with Penlac to nails like before -Patient to return in 3 months for follow up evaluation or sooner if symptoms worsen.  Landis Martins, DPM

## 2020-09-14 ENCOUNTER — Encounter: Payer: Self-pay | Admitting: Radiology

## 2020-09-17 ENCOUNTER — Ambulatory Visit (INDEPENDENT_AMBULATORY_CARE_PROVIDER_SITE_OTHER): Payer: Medicare Other

## 2020-09-17 ENCOUNTER — Telehealth: Payer: Medicare Other

## 2020-09-17 DIAGNOSIS — N1831 Chronic kidney disease, stage 3a: Secondary | ICD-10-CM

## 2020-09-17 DIAGNOSIS — I10 Essential (primary) hypertension: Secondary | ICD-10-CM

## 2020-09-17 DIAGNOSIS — I5022 Chronic systolic (congestive) heart failure: Secondary | ICD-10-CM

## 2020-09-17 NOTE — Chronic Care Management (AMB) (Signed)
Chronic Care Management    Social Work Note  09/17/2020 Name: Sara Fox MRN: [id number] DOB: Oct 25, 1940  Sara Fox is a 80 y.o. year old female who is a primary care patient of Sara Fox, Gill[place]. The CCM team was consulted to assist the patient with chronic disease management and/or care coordination needs related to: Level of Care Concerns.   Engaged with APS caseworker by phone for case discussion in response to provider referral for social work chronic care management and care coordination services.   Consent to Services:  The patient was given information about Chronic Care Management services, agreed to services, and gave verbal consent prior to initiation of services.  Please see initial visit note for detailed documentation.   Patient agreed to services and consent obtained.   Assessment: Review of patient past medical history, allergies, medications, and health status, including review of relevant consultants reports was performed today as part of a comprehensive evaluation and provision of chronic care management and care coordination services.     SDOH (Social Determinants of Health) assessments and interventions performed:    Advanced Directives Status: Not addressed in this encounter.  CCM Care Plan  Allergies  Allergen Reactions  . Buprenorphine Hcl Shortness Of Breath    Throat swelling/trouble breathing and lethargic  . Morphine And Related Shortness Of Breath    Throat swelling/trouble breathing and lethargic  . Celebrex [Celecoxib] Diarrhea and Swelling    Swelling of legs   . Vioxx [Rofecoxib] Palpitations  . Codeine Other (See Comments)    Bloated     Outpatient Encounter Medications as of 09/17/2020  Medication Sig Note  . aspirin EC 81 MG tablet Take 81 mg by mouth daily. 05/24/2020: On hold due to upcoming procedure.  . Calcium Citrate-Vitamin D (CALCIUM CITRATE+D3 PETITES PO) Take 1 tablet by mouth daily.   . cholecalciferol (VITAMIN D) 25 MCG  (1000 UNIT) tablet Take 1,000 Units by mouth daily.   . ciclopirox (PENLAC) 8 % solution Apply 1 application topically at bedtime. Apply over nail and surrounding skin. Apply daily over previous coat. After seven (7) days, file nail and continue cycle.   . CVS MAGNESIUM OXIDE 250 MG TABS TAKE 1 TABLET BY MOUTH WITH EVENING MEAL DAILY   . donepezil (ARICEPT) 10 MG tablet Take 1 tablet (10 mg total) by mouth at bedtime.   . ferrous sulfate 325 (65 FE) MG tablet Take 325 mg by mouth daily with breakfast.   . furosemide (LASIX) 20 MG tablet TAKE 1 TABLET BY MOUTH EVERY OTHER DAY   . gabapentin (NEURONTIN) 100 MG capsule Take 1 capsule (100 mg total) by mouth 2 (two) times daily. (Patient not taking: No sig reported)   . Magnesium 250 MG TABS Take 250 mg by mouth every evening.   . metoprolol tartrate (LOPRESSOR) 25 MG tablet TAKE 1 TABLET BY MOUTH TWICE A DAY   . Multiple Vitamin (MULTIVITAMIN WITH MINERALS) TABS tablet Take 1 tablet by mouth daily.   . nitroGLYCERIN (NITROSTAT) 0.4 MG SL tablet PLACE ONE TABLET UNDER THE TONGUE EVERY 5 MINUTES AS NEEDED FOR CHEST PAIN.   Marland Kitchen oxybutynin (DITROPAN) 5 MG tablet TAKE 1 TABLET BY MOUTH TWICE A DAY (Patient taking differently: Take 5 mg by mouth 2 (two) times daily.)   . pantoprazole (PROTONIX) 40 MG tablet Take 1 tablet (40 mg total) by mouth 2 (two) times daily.   . polyethylene glycol (MIRALAX / GLYCOLAX) packet Take 17 g by mouth daily as needed for  mild constipation.  (Patient not taking: No sig reported)   . Potassium Chloride ER 20 MEQ TBCR TAKE 2 TABLETS BY MOUTH EVERY MORNING (Patient taking differently: Take 40 mEq by mouth daily.)   . QUEtiapine (SEROQUEL) 25 MG tablet TAKE 1 TABLET BY MOUTH EVERYDAY AT BEDTIME   . rosuvastatin (CRESTOR) 40 MG tablet Take 1 tablet (40 mg total) by mouth every evening.   . SYMBICORT 160-4.5 MCG/ACT inhaler TAKE 2 PUFFS BY MOUTH TWICE A DAY   . traMADol (ULTRAM) 50 MG tablet TAKE 1 TABLET 3 TIMES A DAY AND CAN TAKE 1  EXTRA 20 DAYS/MONTH    No facility-administered encounter medications on file as of 09/17/2020.    Patient Active Problem List   Diagnosis Date Noted  . Non-small cell carcinoma of right lung, stage 1 ([place]) 06/08/2020  . Pulmonary nodule 1 cm or greater in diameter 05/21/2020  . Atherosclerotic heart disease of native coronary artery with other forms of angina pectoris ([place]) 04/22/2020  . Fatigue 02/18/2019  . Memory loss 02/18/2019  . Vitamin B12 deficiency 10/23/2018  . Hypotension 10/23/2018  . Stage 3 chronic kidney disease ([place]) 06/03/2018  . Chronic anemia 06/03/2018  . Hypercholesterolemia 06/03/2018  . Malnutrition of moderate degree 07/12/2017  . Hypoalbuminemia 07/11/2017  . Elevated LFTs 07/11/2017  . Closed fracture of left distal radius 05/28/2017  . Snoring 12/21/2016  . OSA and COPD overlap syndrome ([place]) 12/21/2016  . Peripheral musculoskeletal gait disorder 02/05/2015  . Lumbar post-laminectomy syndrome 02/05/2015  . CAD in native artery 10/16/2014  . NSTEMI (non-ST elevated myocardial infarction) ([place]) 09/13/2014  . Cardiomyopathy, ischemic 09/13/2014  . Chronic systolic heart failure ([place])   . Tobacco abuse 09/12/2014  . Dyslipidemia 09/12/2014  . CKD (chronic kidney disease), stage II 09/12/2014  . Normocytic anemia 09/12/2014  . Chronic diastolic heart failure, NYHA class 1 ([place]) 09/12/2014  . Postlaminectomy syndrome, cervical region 08/01/2013  . Chronic midline low back pain with left-sided sciatica 08/01/2013  . Shoulder joint contracture 08/01/2013  . HTN (hypertension) 03/03/2013  . Abnormal genetic test 03/03/2013    Conditions to be addressed/monitored: CHF, HTN and CKD Stage III; Level of care concerns  Care Plan : Social Work [place]  Updates made by Sara Fox since 09/17/2020 12:00 AM    Problem: Home and Family Safety     Goal: Home and Family Safety Maintained   Start Date: 08/16/2020  Expected End Date: 09/15/2020  Priority:  High  Note:   Current Barriers:  . Chronic disease management support and education needs related to CHF, HTN, CKD Stage III, and Mild Cognitive Impairment   . Limited access to caregiver  Social Worker Clinical Goal(s):  Marland Kitchen Over the next 30 days, patient and her family will work with SW to identify resources to obtain home and family safety  CCM SW Interventions:  . Inter-disciplinary care team collaboration (see longitudinal plan of care) . Collaboration with Sara Brine, FNP regarding development and update of comprehensive plan of care as evidenced by provider attestation and co-signature . Inbound call received from Sara Fox with APS to discuss referral . Discussed the patient remains in the home with her son and while placement may benefit the patient, the patient is cognitive enough to make her own decisions. The patient receives assistance in the home from her son and his girlfriend . Determined Sara Fox will plan to contact [facility] to review if they have identified any concerns prior to closing case . SW  will follow up with the patient and her son over the next 14 days  Patient Goals/Self-Care Activities . Over the next 14 days, patient will: With the help of her son  - Patient will attend all scheduled provider appointments Engage with Palliative Care Contact DSS to determine status of Medicaid application Contact SW as needed prior to next scheduled call  Follow Up Plan:  SW will follow up with the patient and her family over the next 14 days       Follow Up Plan: SW will follow up with patient by phone over the next two weeks      Sara Fox, BSW, CDP Social Worker, Certified Dementia Practitioner [place] [facility] [place] [facility] [phone]  Total time spent performing care coordination and/or care management activities with the patient by phone or face to face = 30 minutes.

## 2020-09-19 NOTE — Progress Notes (Signed)
Radiation Oncology         [phone] ________________________________  Name: Sara Fox MRN: [id number]  Date: 09/20/2020  DOB: 07/20/1940  Follow-Up Visit Note  CC: [name], FNP  [place] Nakayama, *    ICD-10-CM   1. Non-small cell carcinoma of right lung, stage 1 (HCC)  C34.91     Diagnosis: Stage IIA (T2b, N0, M0) non-small cell lung cancer of the right upper lobe  Interval Since Last Radiation: One month and five days  Radiation Treatment Dates: 07/27/2020 through 08/16/2020  Site: Right lung Technique: IMRT Total Dose (Gy): 50/50 Dose per Fx (Gy): 10 Completed Fx: 5/5 Beam Energies: 6XFFF  Narrative:  The patient returns today for routine follow-up. No significant interval history since the end of treatment.  On review of systems, she reports mild fatigue she has some shortness of breath on exertion. She denies significant cough or hemoptysis or pain within the chest area..                       ALLERGIES:  is allergic to buprenorphine hcl, morphine and related, celebrex [celecoxib], vioxx [rofecoxib], and codeine.  Meds: Current Outpatient Medications  Medication Sig Dispense Refill  . aspirin EC 81 MG tablet Take 81 mg by mouth daily.    . Calcium Citrate-Vitamin D (CALCIUM CITRATE+D3 PETITES PO) Take 1 tablet by mouth daily.    . cholecalciferol (VITAMIN D) 25 MCG (1000 UNIT) tablet Take 1,000 Units by mouth daily.    . ciclopirox (PENLAC) 8 % solution Apply 1 application topically at bedtime. Apply over nail and surrounding skin. Apply daily over previous coat. After seven (7) days, file nail and continue cycle.    . CVS MAGNESIUM OXIDE 250 MG TABS TAKE 1 TABLET BY MOUTH WITH EVENING MEAL DAILY 90 tablet 1  . donepezil (ARICEPT) 10 MG tablet Take 1 tablet (10 mg total) by mouth at bedtime. 90 tablet 1  . ferrous sulfate 325 (65 FE) MG tablet Take 325 mg by mouth daily with breakfast.    . furosemide (LASIX) 20 MG tablet TAKE 1 TABLET BY MOUTH EVERY  OTHER DAY 45 tablet 1  . gabapentin (NEURONTIN) 100 MG capsule Take 1 capsule (100 mg total) by mouth 2 (two) times daily.    . Magnesium 250 MG TABS Take 250 mg by mouth every evening.    . metoprolol tartrate (LOPRESSOR) 25 MG tablet TAKE 1 TABLET BY MOUTH TWICE A DAY 180 tablet 1  . Multiple Vitamin (MULTIVITAMIN WITH MINERALS) TABS tablet Take 1 tablet by mouth daily.    . nitroGLYCERIN (NITROSTAT) 0.4 MG SL tablet PLACE ONE TABLET UNDER THE TONGUE EVERY 5 MINUTES AS NEEDED FOR CHEST PAIN. 50 tablet 0  . oxybutynin (DITROPAN) 5 MG tablet TAKE 1 TABLET BY MOUTH TWICE A DAY (Patient taking differently: Take 5 mg by mouth 2 (two) times daily.) 180 tablet 1  . pantoprazole (PROTONIX) 40 MG tablet Take 1 tablet (40 mg total) by mouth 2 (two) times daily. 180 tablet 1  . polyethylene glycol (MIRALAX / GLYCOLAX) packet Take 17 g by mouth daily as needed for mild constipation.    . Potassium Chloride ER 20 MEQ TBCR TAKE 2 TABLETS BY MOUTH EVERY MORNING (Patient taking differently: Take 40 mEq by mouth daily.) 180 tablet 3  . QUEtiapine (SEROQUEL) 25 MG tablet TAKE 1 TABLET BY MOUTH EVERYDAY AT BEDTIME 90 tablet 2  . rosuvastatin (CRESTOR) 40 MG tablet Take 1 tablet (40  mg total) by mouth every evening.    . SYMBICORT 160-4.5 MCG/ACT inhaler TAKE 2 PUFFS BY MOUTH TWICE A DAY 30.6 each 1  . traMADol (ULTRAM) 50 MG tablet TAKE 1 TABLET 3 TIMES A DAY AND CAN TAKE 1 EXTRA 20 DAYS/MONTH 110 tablet 2   No current facility-administered medications for this encounter.    Physical Findings: The patient is in no acute distress. Patient is alert and oriented.  height is 5\' 5"  (1.651 m) and weight is 129 lb 8 oz (58.7 kg). Her temporal temperature is 96.8 F (36 C) (abnormal). Her blood pressure is 117/64 and her pulse is 61. Her respiration is 18 and oxygen saturation is 99%.   Lungs are clear to auscultation bilaterally. Heart has regular rate and rhythm. No palpable cervical, supraclavicular, or axillary  adenopathy. Abdomen soft, non-tender, normal bowel sounds.   Lab Findings: Lab Results  Component Value Date   WBC 5.0 06/08/2020   HGB 10.6 (L) 06/08/2020   HCT 35.0 (L) 06/08/2020   MCV 100.6 (H) 06/08/2020   PLT 243 06/08/2020    Radiographic Findings: No results found.  Impression: Stage IIA (T2b, N0, M0) non-small cell lung cancer of the right upper lobe  The patient had minimal side effects with her radiation therapy.  She does report some mild fatigue but no other issues at this time.  Plan: The patient is scheduled to follow up with Dr. Julien Nordmann on 11/01/2020.  Prior to this follow-up she will have a chest CT scan to assess her response to radiation therapy and to plan for any potential additional treatment .she will follow up with radiation oncology in as needed basis in light of her close follow-up with medical oncology.     ____________________________________   Blair Promise, PhD, MD  This document serves as a record of services personally performed by Gery Pray, MD. It was created on his behalf by Clerance Lav, a trained medical scribe. The creation of this record is based on the scribe's personal observations and the provider's statements to them. This document has been checked and approved by the attending provider.

## 2020-09-19 NOTE — Progress Notes (Incomplete)
  Patient Name: Sara Fox MRN: [id number] DOB: 1941-02-17 Referring Physician: Modesto Charon (Profile Not Attached) Date of Service: 08/16/2020 [facility], [place]                                                        End Of Treatment Note  Diagnoses: C34.11-Malignant neoplasm of upper lobe, right bronchus or lung  Cancer Staging: Stage IIA (T2b, N0, M0) non-small cell lung cancer of the right upper lobe  Intent: Curative  Radiation Treatment Dates: 07/27/2020 through 08/16/2020  Site: Right lung Technique: IMRT Total Dose (Gy): 50/50 Dose per Fx (Gy): 10 Completed Fx: 5/5 Beam Energies: 6XFFF  Narrative: The patient tolerated radiation therapy well. No complaints noted.  Plan: The patient will follow-up with radiation oncology in one month.  ________________________________________________   Blair Promise, PhD, MD  This document serves as a record of services personally performed by Gery Pray, MD. It was created on his behalf by Clerance Lav, a trained medical scribe. The creation of this record is based on the scribe's personal observations and the provider's statements to them. This document has been checked and approved by the attending provider.

## 2020-09-20 ENCOUNTER — Encounter: Payer: Self-pay | Admitting: Radiation Oncology

## 2020-09-20 ENCOUNTER — Other Ambulatory Visit: Payer: Self-pay

## 2020-09-20 ENCOUNTER — Ambulatory Visit
Admission: RE | Admit: 2020-09-20 | Discharge: 2020-09-20 | Disposition: A | Discharge status: Home / Self Care | Payer: Medicare Other | Source: Ambulatory Visit | Attending: Radiation Oncology | Admitting: Radiation Oncology

## 2020-09-20 DIAGNOSIS — Z79899 Other long term (current) drug therapy: Secondary | ICD-10-CM | POA: Insufficient documentation

## 2020-09-20 DIAGNOSIS — C3411 Malignant neoplasm of upper lobe, right bronchus or lung: Secondary | ICD-10-CM | POA: Insufficient documentation

## 2020-09-20 DIAGNOSIS — R5383 Other fatigue: Secondary | ICD-10-CM | POA: Diagnosis not present

## 2020-09-20 DIAGNOSIS — Z7982 Long term (current) use of aspirin: Secondary | ICD-10-CM | POA: Insufficient documentation

## 2020-09-20 DIAGNOSIS — Z923 Personal history of irradiation: Secondary | ICD-10-CM | POA: Insufficient documentation

## 2020-09-20 DIAGNOSIS — C3491 Malignant neoplasm of unspecified part of right bronchus or lung: Secondary | ICD-10-CM

## 2020-09-20 HISTORY — DX: Personal history of irradiation: Z92.3

## 2020-09-20 NOTE — Progress Notes (Signed)
Sara Fox is here today for follow up post radiation to the lung.  Lung Side:Right  Does the patient complain of any of the following: . Pain:Patient denies pain.  Marland Kitchen Shortness of breath w/wo exertion: Patient reports shortness of breath on exertion.  . Cough: no . Hemoptysis: no . Pain with swallowing: No . Swallowing/choking concerns: no . Appetite:Good . Energy Level: Reports mild fatigue.  Marland Kitchen Post radiation skin Changes: Patient denies skin changes.    Additional comments if applicable: Vitals:   26/20/35 1106  BP: 117/64  Pulse: 61  Resp: 18  Temp: (!) 96.8 F (36 C)  TempSrc: Temporal  SpO2: 99%  Weight: 129 lb 8 oz (58.7 kg)  Height: 5\' 5"  (1.651 m)

## 2020-09-23 ENCOUNTER — Other Ambulatory Visit: Payer: Self-pay

## 2020-09-23 ENCOUNTER — Other Ambulatory Visit: Payer: Medicare Other | Admitting: Hospice

## 2020-09-23 DIAGNOSIS — F0391 Unspecified dementia with behavioral disturbance: Secondary | ICD-10-CM

## 2020-09-23 DIAGNOSIS — M5440 Lumbago with sciatica, unspecified side: Secondary | ICD-10-CM

## 2020-09-23 DIAGNOSIS — Z515 Encounter for palliative care: Secondary | ICD-10-CM

## 2020-09-23 MED ORDER — GABAPENTIN 100 MG PO CAPS: 100.0000 mg | ORAL_CAPSULE | Freq: Two times a day (BID) | ORAL | 1 refills | Status: DC

## 2020-09-23 NOTE — Progress Notes (Signed)
[facility] Consult Note Telephone: [phone]  Fax: [phone]  PATIENT NAME: Sara Fox DOB: 1941/02/17 MRN: [id number]  PRIMARY CARE PROVIDER:   [name], [place], [place], [address] [place],  [place] [address]  REFERRING PROVIDER: [name], [place], [place], [place],  [place] [address]  RESPONSIBLE PARTY:  Bary Richard Information    Name Relation Home Work Mobile   Pratt,Joseph Son [place], [place] Granddaughter   [phone]      Visit is to build trust and highlight Palliative Medicine as specialized medical care for people living with serious illness, aimed at facilitating better quality of life through symptoms relief, assisting with advance care planning and complex medical decision making. Joseph at home with patient during visit.   RECOMMENDATIONS/PLAN:   Advance Care Planning/Code Status: Patient is a DO NOT RESUSCITATE  Goals of Care: Goals of care include to maximize quality of life and symptom management.  Visit consisted of counseling and education dealing with the complex and emotionally intense issues of symptom management and palliative care in the setting of serious and potentially life-threatening illness. Palliative care team will continue to support patient, patient's family, and medical team.   Symptom management/Plan:  Low back pain: Tylenol 500 mg 3 times daily as needed for pain.  Gabapentin 100 mg twice daily.  NP called PCPs office and spoke with Tamika who said pharmacy will receive order to refill gabapentin.  Has son [name] will go and pick up from pharmacy.  Routine CBC BMP Memory loss: Related to Dementia. Encourage reminiscence, word search/puzzles, cueing for recollection. Promote calm approach and engaging environment. Continue donepezil and ongoing supportive care. Lung CA: Recently diagnosed this year,  completed radiation last month. Appointment in June 2022 with Oncologist to evaluate treatment. Patient/family not interested in Chemo. Patient continues to smoke. Smoking cessation discussed; patient cutting back Agitation: Continue Seroquel at bedtime. Effective.  Follow up: Palliative care will continue to follow for complex medical decision making, advance care planning, and clarification of goals. Return 6 weeks or prn. Encouraged to call provider sooner with any concerns.  CHIEF COMPLAINT: Palliative follow up visit/acute on chronic low back pain  HISTORY OF PRESENT ILLNESS:  Sara Fox a 80 y.o. female with multiple medical problems including acute on chronic low back pain, worsening, intermittent with sciatica.  Patient/son  reported she is out of gabapentin for about a month and this has worsened her situation with pain radiating to her lower extremities, impairing her activities of daily living and ambulation.  Pain is 5 out of 10 on pain scale 0-10, 0 being no pain and 10 being worst pain ever; pain is worse worse when she sits for long and wishes to move; gabapentin is helpful.  History of dementia, lung CA, CAD, CKD, hypertension, gait disorder, CHF.  History obtained from review of EMR, discussion with primary team, family, caregiver  and/or patient. Records reviewed and summarized above. All 10 point systems reviewed and are negative except as documented in history of present illness above  Review and summarization of Epic records shows history from other than patient.   Palliative Care was asked to follow this patient by consultation request of [name], FNP to help address complex decision making in the context of advance care planning and goals of care clarification.   PPS: 50% ROS EYES: denies vision changes, wears eyeglasses ENMT: denies dysphagia Cardiovascular:  denies chest pain Pulmonary: denies cough, denies SOB Abdomen: endorses good appetite, denies constipation,  endorses continence of bowel, incontinent of bladder GU: denies dysuria, urinary frequency MSK:  denies weakness,  no falls reported Skin: denies rash/wound Neurological: Endorses low back pain, denies insomnia Psych: Endorses positive mood Heme/lymph/immuno: denies bruises, abnormal bleeding  Physical Exam: Height/Weight5 feet 6 inches 127 Ibs Constitutional: Endorsed occasional moderate pain to low back General: Well groomed EYES: anicteric sclera, lids intact, no discharge  ENMT: Moist mucous membrane CV: S1 S2, RRR, no LE edema Pulmonary: LCTA, no cough, Abdomen: active BS + 4 quadrants, soft and non tender, no ascites GU: no suprapubic tenderness MSK: moderate sarcopenia, moves all extremities, ambulatory Skin: warm and dry, no rashes or wounds on visible skin Neuro:  weakness, otherwise non focal, forgetful, confused Psych: non-anxious affect Hem/lymph/immuno: no widespread bruising  PERTINENT MEDICATIONS:  Outpatient Encounter Medications as of 09/23/2020  Medication Sig  . aspirin EC 81 MG tablet Take 81 mg by mouth daily.  . Calcium Citrate-Vitamin D (CALCIUM CITRATE+D3 PETITES PO) Take 1 tablet by mouth daily.  . cholecalciferol (VITAMIN D) 25 MCG (1000 UNIT) tablet Take 1,000 Units by mouth daily.  . ciclopirox (PENLAC) 8 % solution Apply 1 application topically at bedtime. Apply over nail and surrounding skin. Apply daily over previous coat. After seven (7) days, file nail and continue cycle.  . CVS MAGNESIUM OXIDE 250 MG TABS TAKE 1 TABLET BY MOUTH WITH EVENING MEAL DAILY  . donepezil (ARICEPT) 10 MG tablet Take 1 tablet (10 mg total) by mouth at bedtime.  . ferrous sulfate 325 (65 FE) MG tablet Take 325 mg by mouth daily with breakfast.  . furosemide (LASIX) 20 MG tablet TAKE 1 TABLET BY MOUTH EVERY OTHER DAY  . gabapentin (NEURONTIN) 100 MG capsule Take 1 capsule (100 mg total) by mouth 2 (two) times daily.  . Magnesium 250 MG TABS Take 250 mg by mouth every  evening.  . metoprolol tartrate (LOPRESSOR) 25 MG tablet TAKE 1 TABLET BY MOUTH TWICE A DAY  . Multiple Vitamin (MULTIVITAMIN WITH MINERALS) TABS tablet Take 1 tablet by mouth daily.  . nitroGLYCERIN (NITROSTAT) 0.4 MG SL tablet PLACE ONE TABLET UNDER THE TONGUE EVERY 5 MINUTES AS NEEDED FOR CHEST PAIN.  Marland Kitchen oxybutynin (DITROPAN) 5 MG tablet TAKE 1 TABLET BY MOUTH TWICE A DAY (Patient taking differently: Take 5 mg by mouth 2 (two) times daily.)  . pantoprazole (PROTONIX) 40 MG tablet Take 1 tablet (40 mg total) by mouth 2 (two) times daily.  . polyethylene glycol (MIRALAX / GLYCOLAX) packet Take 17 g by mouth daily as needed for mild constipation.  . Potassium Chloride ER 20 MEQ TBCR TAKE 2 TABLETS BY MOUTH EVERY MORNING (Patient taking differently: Take 40 mEq by mouth daily.)  . QUEtiapine (SEROQUEL) 25 MG tablet TAKE 1 TABLET BY MOUTH EVERYDAY AT BEDTIME  . rosuvastatin (CRESTOR) 40 MG tablet Take 1 tablet (40 mg total) by mouth every evening.  . SYMBICORT 160-4.5 MCG/ACT inhaler TAKE 2 PUFFS BY MOUTH TWICE A DAY  . traMADol (ULTRAM) 50 MG tablet TAKE 1 TABLET 3 TIMES A DAY AND CAN TAKE 1 EXTRA 20 DAYS/MONTH   No facility-administered encounter medications on file as of 09/23/2020.    HOSPICE ELIGIBILITY/DIAGNOSIS: TBD  PAST MEDICAL HISTORY:  Past Medical History:  Diagnosis Date  . Acute kidney injury ([place])   . Back pain   . Cervical cancer ([place])   . CHF (congestive heart failure) ([place])   .  COPD (chronic obstructive pulmonary disease) ([place])   . Coronary artery disease    s/p DES x2 to RCA 2016  . GERD (gastroesophageal reflux disease)   . History of radiation therapy 07/27/20-08/16/20   Right lung- SBRT- Dr. Gery Pray   . Hyperlipidemia   . Hypertension   . Mild cognitive impairment   . Myocardial infarction ([place])   . Neck pain   . Rhabdomyolysis   . Stomach problems      SOCIAL HX: @SOCX  Patient lives at   for ongoing care  FAMILY HX:  Family History  Problem Relation  Age of Onset  . Heart disease Mother        also HTN  . Diabetes Mother   . Colon cancer Mother   . Heart disease Brother        deceased at 4  . Hypertension Brother   . Heart attack Brother   . Heart disease Brother   . Hypertension Brother     Review lab tests/diagnostics No results for input(s): WBC, HGB, HCT, PLT, MCV in the last 168 hours. No results for input(s): NA, K, CL, CO2, BUN, CREATININE, GLUCOSE in the last 168 hours. CrCl cannot be calculated (Patient's most recent lab result is older than the maximum 21 days allowed.).  ALLERGIES:  Allergies  Allergen Reactions  . Buprenorphine Hcl Shortness Of Breath    Throat swelling/trouble breathing and lethargic  . Morphine And Related Shortness Of Breath    Throat swelling/trouble breathing and lethargic  . Celebrex [Celecoxib] Diarrhea and Swelling    Swelling of legs   . Vioxx [Rofecoxib] Palpitations  . Codeine Other (See Comments)    Bloated       Thank you for the opportunity to participate in the care of DANYALE RIDINGER Please call our office at [phone] if we can be of additional assistance.  Note: Portions of this note were generated with Lobbyist. Dictation errors may occur despite best attempts at proofreading.  Teodoro Spray, NP

## 2020-09-24 ENCOUNTER — Telehealth: Payer: Self-pay

## 2020-09-24 NOTE — Chronic Care Management (AMB) (Signed)
Chronic Care Management Pharmacy Assistant   Name: Sara Fox  MRN: [id number] DOB: August 08, 1940   Reason for Encounter: Disease State/ Hypertention    Recent office visits:  07-30-2020 Lynne Logan, RN (CCM)  08-03-2020 [name], [place]. START Seroquel 50m nightly.   08-05-2020  Little, AClaudette Stapler RN (CCM)  08-09-2020 HDaneen Schick(CCM)  08-16-2020 HDaneen Schick(CCM)  08-17-2020 MMinette Brine [place] 08-24-2020 HDaneen Schick(CCM)  08-31-2020 Little, AClaudette Stapler RN (CCM)  09-03-2020 HDaneen Schick(CCM)  09-17-2020 HDaneen Schick(CCM)   Recent consult visits:  08[facility]  [facility]  08-13-2020 [facility]ery PrayMD ([facility])  08-16-2020 [facility]ery PrayMD ([facility])  08-26-2020 ELaverda SorensonI, NP (Palliative care)  09-09-2020 SLandis Martins D[place]atham(Podiatry)  09-20-2020 KGery PrayMD ([facility])  09-23-2020 ELaverda SorensonI, NP (Palliative care)    Hospital visits:  Medication Reconciliation was completed by comparing discharge summary, patient's EMR and Pharmacy list, and upon discussion with patient.  Admitted to the hospital on 08-09-2020 due to Agitation. Discharge date was 08-09-2020. [facility]  Medication Changes at Hospital Discharge: None  Medications Discontinued at Hospital Discharge: None  Medications that remain the same after Hospital Discharge:??  -All other medications will remain the same.    Medications: Outpatient Encounter Medications as of 09/24/2020  Medication Sig Note  . aspirin EC 81 MG tablet Take 81 mg by mouth daily. 05/24/2020: On hold due to upcoming procedure.  . Calcium Citrate-Vitamin D (CALCIUM CITRATE+D3 PETITES PO) Take 1 tablet by mouth daily.   . cholecalciferol  (VITAMIN D) 25 MCG (1000 UNIT) tablet Take 1,000 Units by mouth daily.   . ciclopirox (PENLAC) 8 % solution Apply 1 application topically at bedtime. Apply over nail and surrounding skin. Apply daily over previous coat. After seven (7) days, file nail and continue cycle.   . [facility] MAGNESIUM OXIDE 250 MG TABS TAKE 1 TABLET BY MOUTH WITH EVENING MEAL DAILY   . donepezil (ARICEPT) 10 MG tablet Take 1 tablet (10 mg total) by mouth at bedtime.   . ferrous sulfate 325 (65 FE) MG tablet Take 325 mg by mouth daily with breakfast.   . furosemide (LASIX) 20 MG tablet TAKE 1 TABLET BY MOUTH EVERY OTHER DAY   . gabapentin (NEURONTIN) 100 MG capsule Take 1 capsule (100 mg total) by mouth 2 (two) times daily.   . Magnesium 250 MG TABS Take 250 mg by mouth every evening.   . metoprolol tartrate (LOPRESSOR) 25 MG tablet TAKE 1 TABLET BY MOUTH TWICE A DAY   . Multiple Vitamin (MULTIVITAMIN WITH MINERALS) TABS tablet Take 1 tablet by mouth daily.   . nitroGLYCERIN (NITROSTAT) 0.4 MG SL tablet PLACE ONE TABLET UNDER THE TONGUE EVERY 5 MINUTES AS NEEDED FOR CHEST PAIN.   .Marland Kitchenoxybutynin (DITROPAN) 5 MG tablet TAKE 1 TABLET BY MOUTH TWICE A DAY (Patient taking differently: Take 5 mg by mouth 2 (two) times daily.)   . pantoprazole (PROTONIX) 40 MG tablet Take 1 tablet (40 mg total) by mouth 2 (two) times daily.   . polyethylene glycol (MIRALAX / GLYCOLAX) packet Take 17 g by mouth daily as needed for mild constipation.   . Potassium Chloride ER 20 MEQ TBCR TAKE 2 TABLETS BY MOUTH EVERY MORNING (Patient taking differently: Take 40  mEq by mouth daily.)   . QUEtiapine (SEROQUEL) 25 MG tablet TAKE 1 TABLET BY MOUTH EVERYDAY AT BEDTIME   . rosuvastatin (CRESTOR) 40 MG tablet Take 1 tablet (40 mg total) by mouth every evening.   . SYMBICORT 160-4.5 MCG/ACT inhaler TAKE 2 PUFFS BY MOUTH TWICE A DAY   . traMADol (ULTRAM) 50 MG tablet TAKE 1 TABLET 3 TIMES A DAY AND CAN TAKE 1 EXTRA 20 DAYS/MONTH    No facility-administered  encounter medications on file as of 09/24/2020.   Reviewed chart prior to disease state call. Spoke with patient regarding BP  Recent Office Vitals: BP Readings from Last 3 Encounters:  09/20/20 117/64  08/17/20 112/78  08/09/20 121/74   Pulse Readings from Last 3 Encounters:  09/20/20 61  08/17/20 95  08/09/20 72    Wt Readings from Last 3 Encounters:  09/20/20 129 lb 8 oz (58.7 kg)  08/17/20 126 lb (57.2 kg)  08/03/20 120 lb 12.8 oz (54.8 kg)     Kidney Function Lab Results  Component Value Date/Time   CREATININE 1.42 (H) 06/08/2020 01:36 PM   CREATININE 1.39 (H) 05/21/2020 10:07 AM   CREATININE 1.27 (H) 04/22/2020 09:57 AM   GFR 35.79 (L) 05/21/2020 10:07 AM   GFRNONAA 37 (L) 06/08/2020 01:36 PM   GFRAA 46 (L) 04/22/2020 09:57 AM    BMP Latest Ref Rng & Units 06/08/2020 05/21/2020 04/22/2020  Glucose 70 - 99 mg/dL 85 90 85  BUN 8 - 23 mg/dL _0 Creatinine 0.44 - 1.00 mg/dL 1.42(H) 1.39(H) 1.27(H)  BUN/Creat Ratio 12 - 28 - - 15  Sodium 135 - 145 mmol/L 141 140 143  Potassium 3.5 - 5.1 mmol/L 3.8 4.4 4.8  Chloride 98 - 111 mmol/L 109 107 111(H)  CO2 22 - 32 mmol/L 26 29 19(L)  Calcium 8.9 - 10.3 mg/dL 9.6 9.5 9.1    . Current antihypertensive regimen:  ? Aspirin 81 mg daily. ? Nitrostat 0.4 SL tablet PRN ? Lopressor 25 mg tablet 2 times daily ? Lasix 20 mg tablet every other day  . How often are you checking your Blood Pressure? Patient's son states he checks her blood pressure and when feeling symptomatic.  . Current home BP readings: Patient's son states 117/64 at Oncology appointment. Doesn't have any documented readings from home.  . What recent interventions/DTPs have been made by any provider to improve Blood Pressure control since last CPP Visit: Patient's son states patient is taking all medications as directed.  . Any recent hospitalizations or ED visits since last visit with CPP? Yes   . What diet changes have been made to improve Blood Pressure  Control?  o Patient's son states patient's diet is still the same from last check-in on 07-29-2020 ? Patient's son reports patient eats shrimp, tuna, canned chopped chicken, crackers, veggies,  ? Patient's son voiced he will take the patient out to eat or his fiance will cook a meal for her that consist of pork, chop, mac and cheese, veggies. ? Patient's son verbalized the patient had surgery where they removed part of her stomach so she can only tolerate small portioned food.  . What exercise is being done to improve your Blood Pressure Control?  o Patient's son states she is active around the house. She is able to move around and clean.  Adherence Review: Is the patient currently on ACE/ARB medication? No Does the patient have >5 day gap between last estimated fill dates? No  NOTES: Patient's  son states patient seems to be doing fine.Informed patient's son of next appointment on 10-13-20 with Orlando Penner [facility].   Star Rating Drugs: Rosuvastatin 40 mg- Last filled 07-07-2020 [facility]  [place]  [phone]

## 2020-09-29 ENCOUNTER — Ambulatory Visit: Payer: Medicare Other

## 2020-09-29 DIAGNOSIS — I10 Essential (primary) hypertension: Secondary | ICD-10-CM

## 2020-09-29 DIAGNOSIS — N1831 Chronic kidney disease, stage 3a: Secondary | ICD-10-CM | POA: Diagnosis not present

## 2020-09-29 DIAGNOSIS — I5022 Chronic systolic (congestive) heart failure: Secondary | ICD-10-CM | POA: Diagnosis not present

## 2020-09-29 DIAGNOSIS — G3184 Mild cognitive impairment, so stated: Secondary | ICD-10-CM

## 2020-09-29 NOTE — Chronic Care Management (AMB) (Signed)
Chronic Care Management    Social Work Note  09/29/2020 Name: Sara Fox MRN: [id number] DOB: June 14, 1940  Sara Fox is a 80 y.o. year old female who is a primary care patient of [name], [place]. The CCM team was consulted to assist the patient with chronic disease management and/or care coordination needs related to: [employer]  and Level of Care Concerns.   Engaged with the patient and her son by phone for follow up visit in response to provider referral for social work chronic care management and care coordination services.   Consent to Services:  The patient was given information about Chronic Care Management services, agreed to services, and gave verbal consent prior to initiation of services.  Please see initial visit note for detailed documentation.   Patient agreed to services and consent obtained.   Assessment: Review of patient past medical history, allergies, medications, and health status, including review of relevant consultants reports was performed today as part of a comprehensive evaluation and provision of chronic care management and care coordination services.     SDOH (Social Determinants of Health) assessments and interventions performed:    Advanced Directives Status: Not addressed in this encounter.  CCM Care Plan  Allergies  Allergen Reactions  . Buprenorphine Hcl Shortness Of Breath    Throat swelling/trouble breathing and lethargic  . Morphine And Related Shortness Of Breath    Throat swelling/trouble breathing and lethargic  . Celebrex [Celecoxib] Diarrhea and Swelling    Swelling of legs   . Vioxx [Rofecoxib] Palpitations  . Codeine Other (See Comments)    Bloated     Outpatient Encounter Medications as of 09/29/2020  Medication Sig Note  . aspirin EC 81 MG tablet Take 81 mg by mouth daily. 05/24/2020: On hold due to upcoming procedure.  . Calcium Citrate-Vitamin D (CALCIUM CITRATE+D3 PETITES PO) Take 1 tablet by mouth daily.   .  cholecalciferol (VITAMIN D) 25 MCG (1000 UNIT) tablet Take 1,000 Units by mouth daily.   . ciclopirox (PENLAC) 8 % solution Apply 1 application topically at bedtime. Apply over nail and surrounding skin. Apply daily over previous coat. After seven (7) days, file nail and continue cycle.   . [facility] MAGNESIUM OXIDE 250 MG TABS TAKE 1 TABLET BY MOUTH WITH EVENING MEAL DAILY   . donepezil (ARICEPT) 10 MG tablet Take 1 tablet (10 mg total) by mouth at bedtime.   . ferrous sulfate 325 (65 FE) MG tablet Take 325 mg by mouth daily with breakfast.   . furosemide (LASIX) 20 MG tablet TAKE 1 TABLET BY MOUTH EVERY OTHER DAY   . gabapentin (NEURONTIN) 100 MG capsule Take 1 capsule (100 mg total) by mouth 2 (two) times daily.   . Magnesium 250 MG TABS Take 250 mg by mouth every evening.   . metoprolol tartrate (LOPRESSOR) 25 MG tablet TAKE 1 TABLET BY MOUTH TWICE A DAY   . Multiple Vitamin (MULTIVITAMIN WITH MINERALS) TABS tablet Take 1 tablet by mouth daily.   . nitroGLYCERIN (NITROSTAT) 0.4 MG SL tablet PLACE ONE TABLET UNDER THE TONGUE EVERY 5 MINUTES AS NEEDED FOR CHEST PAIN.   Marland Kitchen oxybutynin (DITROPAN) 5 MG tablet TAKE 1 TABLET BY MOUTH TWICE A DAY (Patient taking differently: Take 5 mg by mouth 2 (two) times daily.)   . pantoprazole (PROTONIX) 40 MG tablet Take 1 tablet (40 mg total) by mouth 2 (two) times daily.   . polyethylene glycol (MIRALAX / GLYCOLAX) packet Take 17 g by mouth daily as  needed for mild constipation.   . Potassium Chloride ER 20 MEQ TBCR TAKE 2 TABLETS BY MOUTH EVERY MORNING (Patient taking differently: Take 40 mEq by mouth daily.)   . QUEtiapine (SEROQUEL) 25 MG tablet TAKE 1 TABLET BY MOUTH EVERYDAY AT BEDTIME   . rosuvastatin (CRESTOR) 40 MG tablet Take 1 tablet (40 mg total) by mouth every evening.   . SYMBICORT 160-4.5 MCG/ACT inhaler TAKE 2 PUFFS BY MOUTH TWICE A DAY   . traMADol (ULTRAM) 50 MG tablet TAKE 1 TABLET 3 TIMES A DAY AND CAN TAKE 1 EXTRA 20 DAYS/MONTH    No  facility-administered encounter medications on file as of 09/29/2020.    Patient Active Problem List   Diagnosis Date Noted  . Non-small cell carcinoma of right lung, stage 1 ([place]) 06/08/2020  . Pulmonary nodule 1 cm or greater in diameter 05/21/2020  . Atherosclerotic heart disease of native coronary artery with other forms of angina pectoris ([place]) 04/22/2020  . Fatigue 02/18/2019  . Memory loss 02/18/2019  . Vitamin B12 deficiency 10/23/2018  . Hypotension 10/23/2018  . Stage 3 chronic kidney disease ([place]) 06/03/2018  . Chronic anemia 06/03/2018  . Hypercholesterolemia 06/03/2018  . Malnutrition of moderate degree 07/12/2017  . Hypoalbuminemia 07/11/2017  . Elevated LFTs 07/11/2017  . Closed fracture of left distal radius 05/28/2017  . Snoring 12/21/2016  . OSA and COPD overlap syndrome ([place]) 12/21/2016  . Peripheral musculoskeletal gait disorder 02/05/2015  . Lumbar post-laminectomy syndrome 02/05/2015  . CAD in native artery 10/16/2014  . NSTEMI (non-ST elevated myocardial infarction) ([place]) 09/13/2014  . Cardiomyopathy, ischemic 09/13/2014  . Chronic systolic heart failure ([place])   . Tobacco abuse 09/12/2014  . Dyslipidemia 09/12/2014  . CKD (chronic kidney disease), stage II 09/12/2014  . Normocytic anemia 09/12/2014  . Chronic diastolic heart failure, NYHA class 1 ([place]) 09/12/2014  . Postlaminectomy syndrome, cervical region 08/01/2013  . Chronic midline low back pain with left-sided sciatica 08/01/2013  . Shoulder joint contracture 08/01/2013  . HTN (hypertension) 03/03/2013  . Abnormal genetic test 03/03/2013    Conditions to be addressed/monitored: CHF, HTN, CKD Stage III and Cognitive Impairment; Level of care concerns  Care Plan : Social Work [facility] Care Plan  Updates made by [name] since 09/29/2020 12:00 AM    Problem: Home and Family Safety     Goal: Home and Family Safety Maintained Completed 09/29/2020  Start Date: 08/16/2020  Expected End Date: 09/15/2020   Priority: High  Note:   Current Barriers:  . Chronic disease management support and education needs related to CHF, HTN, CKD Stage III, and Mild Cognitive Impairment   . Limited access to caregiver  Social Worker Clinical Goal(s):  Marland Kitchen Over the next 30 days, patient and her family will work with SW to identify resources to obtain home and family safety  CCM SW Interventions:  . Inter-disciplinary care team collaboration (see longitudinal plan of care) . Collaboration with [name], FNP regarding development and update of comprehensive plan of care as evidenced by provider attestation and co-signature . Successful outbound call placed to the patient and her son to assess goal progression . Discussed the patient has yet to receive Medicaid determination letter . Sara Fox reports he has called to leave several messages but has yet to get a response . Sara Fox also reports he is awaiting an award letter from social security in order to apply for food stamps on the patients behalf . Determined Sara Fox feels the patient is sage in the home at  this time with care needs being met . Unsuccessful outbound call placed to DSS to follow up on Medicaid application status- voice message left requesting a return call . Goal closed- APS has determined the patient is safe in the home and family feels she is as well Patient Goals/Self-Care Activities . patient will: With the help of her son  - Patient will attend all scheduled provider appointments Engage with Palliative Care Contact DSS to determine status of Medicaid application Contact SW as needed prior to next scheduled call  Follow Up Plan:  SW will follow up with the patient over the next month. See alternative care plan entries for updates       Follow Up Plan: SW will follow up with patient by phone over the next month      [name], BSW, CDP Social Worker, Certified Dementia Practitioner [place] / [place]  Management [phone]  Total time spent performing care coordination and/or care management activities with the patient by phone or face to face = 25 minutes.

## 2020-09-29 NOTE — Patient Instructions (Signed)
Social Worker Visit Information  Goals we discussed today:  Goals Addressed            This Visit's Progress   . COMPLETED: Home and Family Safety Maintained       Timeframe:  Short-Term Goal Priority:  High Start Date:   4.4.22                          Expected End Date: 5.4.22                      Goal closed 5.18.22  Patient Goals/Self-Care Activities . patient will: With the help of her son  - Patient will attend all scheduled provider appointments Patient will attend all scheduled provider appointments Engage with Palliative Care Contact DSS to determine status of Medicaid application        Follow Up Plan: SW will follow up with patient by phone over the next month   [name], BSW, CDP Social Worker, Certified Dementia Practitioner [place] / [place] Management [phone]

## 2020-10-12 ENCOUNTER — Other Ambulatory Visit: Payer: Self-pay | Admitting: Nurse Practitioner

## 2020-10-12 ENCOUNTER — Telehealth: Payer: Self-pay

## 2020-10-12 DIAGNOSIS — R32 Unspecified urinary incontinence: Secondary | ICD-10-CM

## 2020-10-12 NOTE — Chronic Care Management (AMB) (Signed)
Called patient's son for an appointment reminder with Orlando Penner, [place] on 10-13-2020 at 3:30. Instructed patient's son to have all meds/supplements and logs available for review. Patient's son voiced understanding.  [place] Pharmacist Assistant [phone]

## 2020-10-13 ENCOUNTER — Ambulatory Visit (INDEPENDENT_AMBULATORY_CARE_PROVIDER_SITE_OTHER): Payer: Medicare Other

## 2020-10-13 DIAGNOSIS — E7800 Pure hypercholesterolemia, unspecified: Secondary | ICD-10-CM

## 2020-10-13 DIAGNOSIS — I10 Essential (primary) hypertension: Secondary | ICD-10-CM

## 2020-10-13 NOTE — Progress Notes (Signed)
Chronic Care Management Pharmacy Note  11/19/2020 Name:  Sara Fox MRN:  [id number] DOB:  Jan 01, 1941  Summary: Sara Fox son is taking care of Sara Fox and is overwhelmed with her medications.   Recommendations/Changes made from today's visit: Recommend patient utilize pill packaging system for medication and have medication delivered for accessibility.   Plan: Patients medication to be packaged and delivered.   Subjective: Sara Fox is an 80 y.o. year old female who is a primary patient of Sara Fox, [place].  The CCM team was consulted for assistance with disease management and care coordination needs.  Spoke with Sara Fox who reports that she has been running out of medication.  He reports that he is taking care of his mother in between working. He reports that it is a lot of work but he is going to get things done. His fiancee works with the elderly and she is a Music therapist.    Engaged with patient by telephone for follow up visit in response to provider referral for pharmacy case management and/or care coordination services.   Consent to Services:  The patient was given information about Chronic Care Management services, agreed to services, and gave verbal consent prior to initiation of services.  Please see initial visit note for detailed documentation.   Patient Care Team: Sara Fox, [place] as PCP - General (General Practice) Debara Pickett Nadean Corwin, MD as PCP - Cardiology (Cardiology) [name] as Social Worker Little, Claudette Stapler, RN as Case Manager Valrie Hart, RN as Oncology Nurse Navigator (Oncology) Recent office visits:  07-30-2020 Lynne Logan, RN (CCM)   08-03-2020 Sara Fox, [place]. START Seroquel $RemoveBefore'25mg'jiJvZRUmoHbdt$  nightly.   08-05-2020  Little, Claudette Stapler, RN (CCM)   08-09-2020 [name] (CCM)   08-16-2020 [name] (CCM)   08-17-2020 Sara Fox, [place]   08-24-2020 [name] (CCM)   08-31-2020 Little, Claudette Stapler, RN (CCM)   09-03-2020  [name] (CCM)   09-17-2020 [name] (CCM)     Recent consult visits:  08-02-2020 Gery Pray MD ([facility])   08-06-2020 Gery Pray MD ([facility])   08-13-2020 Gery Pray MD ([facility])   08-16-2020 Gery Pray MD ([facility])   08-26-2020 Laverda Sorenson I, NP (Palliative care)   09-09-2020 Landis Martins, [place] (Podiatry)   09-20-2020 Gery Pray MD ([facility])   09-23-2020 Laverda Sorenson I, NP (Palliative care)  Hospital visits:  Medication Reconciliation was completed by comparing discharge summary, patient's EMR and Pharmacy list, and upon discussion with patient.   Admitted to the hospital on 08-09-2020 due to Agitation. Discharge date was 08-09-2020. Discharged from [place]?Medications Started at [facility] Discharge:?? None   Medication Changes at Hospital Discharge: None   Medications Discontinued at Hospital Discharge: None   Medications that remain the same after Hospital Discharge:?? -All other medications will remain the same.     Objective:  Lab Results  Component Value Date   CREATININE 1.72 (H) 10/28/2020   BUN 28 (H) 10/28/2020   GFR 35.79 (L) 05/21/2020   GFRNONAA 30 (L) 10/28/2020   GFRAA 46 (L) 04/22/2020   NA 141 10/28/2020   K 5.0 10/28/2020   CALCIUM 9.3 10/28/2020   CO2 22 10/28/2020   GLUCOSE 97 10/28/2020    Lab Results  Component Value Date/Time   GFR 35.79 (L) 05/21/2020 10:07 AM   MICROALBUR 150 12/05/2018 11:52 AM  Last diabetic Eye exam: No results found for: HMDIABEYEEXA  Last diabetic Foot exam: No results found for: HMDIABFOOTEX   Lab Results  Component Value Date   CHOL 162 04/22/2020   HDL 48 04/22/2020   LDLCALC 92 04/22/2020   TRIG 122 04/22/2020   CHOLHDL 3.4 04/22/2020    Hepatic Function Latest Ref Rng & Units 10/28/2020 06/08/2020 04/22/2020  Total Protein 6.5 - 8.1 g/dL  6.9 7.2 6.3  Albumin 3.5 - 5.0 g/dL 3.6 3.6 3.8  AST 15 - 41 U/L $Remo'21 20 16  'kJGxH$ ALT 0 - 44 U/L $Remo'15 13 10  'TiAnD$ Alk Phosphatase 38 - 126 U/L 107 87 91  Total Bilirubin 0.3 - 1.2 mg/dL 0.5 0.4 0.3  Bilirubin, Direct 0.1 - 0.5 mg/dL - - -    Lab Results  Component Value Date/Time   TSH 0.770 07/10/2019 11:21 AM   TSH 1.180 09/02/2018 01:11 PM    CBC Latest Ref Rng & Units 10/28/2020 06/08/2020 05/21/2020  WBC 4.0 - 10.5 K/uL 4.0 5.0 4.4  Hemoglobin 12.0 - 15.0 g/dL 10.5(L) 10.6(L) 11.0(L)  Hematocrit 36.0 - 46.0 % 34.7(L) 35.0(L) 33.4(L)  Platelets 150 - 400 K/uL 221 243 261.0    No results found for: VD25OH  Clinical ASCVD: Yes  The ASCVD Risk score Mikey Bussing DC Jr., et al., 2013) failed to calculate for the following reasons:   The 2013 ASCVD risk score is only valid for ages 29 to 91   The patient has a prior MI or stroke diagnosis    Depression screen [facility] 2/9 07/05/2020 01/01/2020 12/11/2019  Decreased Interest 0 0 0  Down, Depressed, Hopeless 0 0 0  PHQ - 2 Score 0 0 0  Altered sleeping - - 0  Tired, decreased energy - - 0  Change in appetite - - 0  Feeling bad or failure about yourself  - - 0  Trouble concentrating - - 0  Moving slowly or fidgety/restless - - 0  Suicidal thoughts - - 0  PHQ-9 Score - - 0  Difficult doing work/chores - - Not difficult at all  Some recent data might be hidden     Social History   Tobacco Use  Smoking Status Every Day   Packs/day: 0.50   Years: 60.00   Pack years: 30.00   Types: Cigarettes  Smokeless Tobacco Never  Tobacco Comments   currently smokes 1/2 ppd or less   BP Readings from Last 3 Encounters:  11/18/20 102/61  11/02/20 120/84  11/01/20 118/69   Pulse Readings from Last 3 Encounters:  11/18/20 75  11/02/20 67  11/01/20 (!) 57   Wt Readings from Last 3 Encounters:  11/18/20 126 lb 12.8 oz (57.5 kg)  11/02/20 123 lb 9.6 oz (56.1 kg)  11/01/20 124 lb 6.4 oz (56.4 kg)   BMI Readings from Last 3 Encounters:  11/18/20 21.10  kg/m  11/02/20 20.57 kg/m  11/01/20 20.70 kg/m    Assessment/Interventions: Review of patient past medical history, allergies, medications, health status, including review of consultants reports, laboratory and other test data, was performed as part of comprehensive evaluation and provision of chronic care management services.   SDOH:  (Social Determinants of Health) assessments and interventions performed: Yes  SDOH Screenings   Alcohol Screen: Not on file  Depression (PHQ2-9): Low Risk    PHQ-2 Score: 0  Financial Resource Strain: Low Risk    Difficulty of Paying Living Expenses: Not hard at all  Food Insecurity: No Food Insecurity   Worried About  Running Out of Food in the Last Year: Never true   Ran Out of Food in the Last Year: Never true  Housing: Not on file  Physical Activity: Inactive   Days of Exercise per Week: 0 days   Minutes of Exercise per Session: 0 min  Social Connections: Not on file  Stress: No Stress Concern Present   Feeling of Stress : Not at all  Tobacco Use: High Risk   Smoking Tobacco Use: Every Day   Smokeless Tobacco Use: Never  Transportation Needs: No Transportation Needs   Lack of Transportation (Medical): No   Lack of Transportation (Non-Medical): No    CCM Care Plan  Allergies  Allergen Reactions   Buprenorphine Hcl Shortness Of Breath    Throat swelling/trouble breathing and lethargic   Morphine And Related Shortness Of Breath    Throat swelling/trouble breathing and lethargic   Celebrex [Celecoxib] Diarrhea and Swelling    Swelling of legs    Vioxx [Rofecoxib] Palpitations   Codeine Other (See Comments)    Bloated     Medications Reviewed Today     Reviewed by Mayford Knife, RPH (Pharmacist) on 11/19/20 at 1032  Med List Status: <None>   Medication Order Taking? Sig Documenting Provider Last Dose Status Informant  aspirin EC 81 MG tablet [id number] Yes Take 81 mg by mouth daily. [provider] Taking Active             Med Note Caren Macadam Nov 01, 2020  9:52 AM)    Calcium Citrate-Vitamin D (CALCIUM CITRATE+D3 PETITES PO) [id number] Yes Take 1 tablet by mouth daily. [provider] Taking Active   cholecalciferol (VITAMIN D) 25 MCG (1000 UNIT) tablet [id number] Yes Take 1,000 Units by mouth daily. [provider] Taking Active   ciclopirox (PENLAC) 8 % solution [id number] Yes Apply 1 application topically at bedtime. Apply over nail and surrounding skin. Apply daily over previous coat. After seven (7) days, file nail and continue cycle. Collene Gobble, MD Taking Active   [facility] MAGNESIUM OXIDE 250 MG TABS [id number] Yes TAKE 1 TABLET BY MOUTH WITH EVENING MEAL DAILY Sara Brine, FNP Taking Active   donepezil (ARICEPT) 10 MG tablet [id number] Yes Take 1 tablet (10 mg total) by mouth at bedtime. Sara Brine, FNP Taking Active   ferrous sulfate 325 (65 FE) MG tablet [id number] Yes Take 325 mg by mouth daily with breakfast. [provider] Taking Active   furosemide (LASIX) 20 MG tablet [id number] Yes TAKE 1 TABLET BY MOUTH EVERY OTHER DAY Sara Brine, FNP Taking Active   gabapentin (NEURONTIN) 100 MG capsule [id number] Yes Take 1 capsule (100 mg total) by mouth 2 (two) times daily. Sara Brine, FNP Taking Active   Magnesium 250 MG TABS [id number] Yes Take 250 mg by mouth every evening. [provider] Taking Active Family Member  memantine (NAMENDA) 5 MG tablet [id number]  Take 1 tablet (5 mg total) by mouth daily. Sara Brine, FNP  Active   metoprolol tartrate (LOPRESSOR) 25 MG tablet [id number] Yes TAKE 1 TABLET BY MOUTH TWICE A DAY Hilty, Nadean Corwin, MD Taking Active   Multiple Vitamin (MULTIVITAMIN WITH MINERALS) TABS tablet [id number] Yes Take 1 tablet by mouth daily. [provider] Taking Active   nitroGLYCERIN (NITROSTAT) 0.4 MG SL tablet [id number]  PLACE ONE TABLET UNDER THE TONGUE EVERY 5 MINUTES AS NEEDED FOR CHEST PAIN. Pixie Casino, MD  Active    oxybutynin (DITROPAN) 5 MG tablet [id number]  Take  0.5 tablets (2.5 mg total) by mouth 2 (two) times daily. Sara Brine, FNP  Active   pantoprazole (PROTONIX) 40 MG tablet [id number] Yes Take 1 tablet (40 mg total) by mouth 2 (two) times daily. Sara Brine, FNP Taking Active   polyethylene glycol [facility] / GLYCOLAX) packet [id number] Yes Take 17 g by mouth daily as needed for mild constipation. [provider] Taking Active   Potassium Chloride ER 20 MEQ TBCR [id number] Yes TAKE 2 TABLETS BY MOUTH EVERY MORNING  Patient taking differently: Take 40 mEq by mouth daily.   Pixie Casino, MD Taking Active Family Member  QUEtiapine (SEROQUEL) 50 MG tablet [id number]  TAKE 1 TABLET BY MOUTH EVERYDAY AT BEDTIME Sara Brine, FNP  Active   rosuvastatin (CRESTOR) 40 MG tablet [id number]  TAKE 1 TABLET BY MOUTH ONCE DAILY Pixie Casino, MD  Active   SYMBICORT 160-4.5 MCG/ACT inhaler [id number] Yes TAKE 2 PUFFS BY MOUTH TWICE A Nolene Ebbs, FNP Taking Active   traMADol (ULTRAM) 50 MG tablet [id number] Yes TAKE 1 TABLET 3 TIMES A DAY AND CAN TAKE 1 EXTRA 20 DAYS/MONTH Kirsteins, Luanna Salk, MD Taking Active             Patient Active Problem List   Diagnosis Date Noted   Late onset Alzheimer's dementia with behavioral disturbance ([place]) 11/18/2020   Visual hallucination 11/18/2020   Sundowning 11/18/2020   Non-small cell carcinoma of right lung, stage 1 ([place]) 06/08/2020   Pulmonary nodule 1 cm or greater in diameter 05/21/2020   Atherosclerotic heart disease of native coronary artery with other forms of angina pectoris ([place]) 04/22/2020   Fatigue 02/18/2019   Memory loss 02/18/2019   Vitamin B12 deficiency 10/23/2018   Hypotension 10/23/2018   Stage 3 chronic kidney disease ([place]) 06/03/2018   Chronic anemia 06/03/2018   Hypercholesterolemia 06/03/2018   Malnutrition of moderate degree 07/12/2017   Hypoalbuminemia 07/11/2017   Elevated LFTs 07/11/2017   Closed fracture of left  distal radius 05/28/2017   Snoring 12/21/2016   OSA and COPD overlap syndrome ([place]) 12/21/2016   Peripheral musculoskeletal gait disorder 02/05/2015   Lumbar post-laminectomy syndrome 02/05/2015   CAD in native artery 10/16/2014   NSTEMI (non-ST elevated myocardial infarction) ([place]) 09/13/2014   Cardiomyopathy, ischemic 74/25/9563   Chronic systolic heart failure ([place])    Tobacco abuse 09/12/2014   Dyslipidemia 09/12/2014   CKD (chronic kidney disease), stage II 09/12/2014   Normocytic anemia 09/12/2014   Chronic diastolic heart failure, NYHA class 1 ([place]) 09/12/2014   Postlaminectomy syndrome, cervical region 08/01/2013   Chronic midline low back pain with left-sided sciatica 08/01/2013   Shoulder joint contracture 08/01/2013   HTN (hypertension) 03/03/2013   Abnormal genetic test 03/03/2013    Immunization History  Administered Date(s) Administered   PFIZER(Purple Top)SARS-COV-2 Vaccination 08/30/2019, 09/25/2019    Conditions to be addressed/monitored:  Hypertension and Hyperlipidemia  Care Plan : [place]  Updates made by Mayford Knife, RPH since 11/19/2020 12:00 AM     Problem: HTN, HLD   Priority: High     Long-Range Goal: Disease Management   This Visit's Progress: On track  Note:     Current Barriers:  Unable to independently monitor therapeutic efficacy Unable to self administer medications as prescribed Does not adhere to prescribed medication regimen  Pharmacist Clinical Goal(s):  Patient will achieve adherence to monitoring guidelines and medication adherence to achieve therapeutic efficacy through collaboration with PharmD and provider.   Interventions: 1:1 collaboration with Laurance Flatten,  Doreene Burke, [place] regarding development and update of comprehensive plan of care as evidenced by provider attestation and co-signature Inter-disciplinary care team collaboration (see longitudinal plan of care) Comprehensive medication review performed; medication  list updated in electronic medical record  Hypertension (BP goal <140/90) -Controlled -Current treatment: Metoprolol Tartrate 25 mg tablet twice per day -Medications previously tried: none noted   -Current home readings: patients BP is not being checked at this time -Current dietary habits: patient is eating well trying to make sure she is healthy.  -Current exercise habits: not currently exercising.  -Denies hypotensive/hypertensive symptoms -Educated on Importance of home blood pressure monitoring; Proper BP monitoring technique; -Counseled to monitor BP at home at least once per week, document, and provide log at future appointments -Recommended to continue current medication  Hyperlipidemia: (LDL goal < 70) -Uncontrolled -Current treatment: Rosuvastatin 40 mg tablet once per day  -Current dietary patterns: patient is not eating fried and fatty foods -Current exercise habits: Patient is not currently exercising.  -Educated on Benefits of statin for ASCVD risk reduction; Importance of limiting foods high in cholesterol; -Recommended to continue current medication  Patient Goals/Self-Care Activities Patient will:  - take medications as prescribed  Follow Up Plan: The patient has been provided with contact information for the care management team and has been advised to call with any health related questions or concerns.       Medication Assistance: None required.  Patient affirms current coverage meets needs.  Compliance/Adherence/Medication fill history: Care Gaps: Shingles vaccine COVID-19 Vaccine   Star-Rating Drugs: Rosuvastatin 40 mg tablet daily  Patient's preferred pharmacy is:  [id number] [place], [place] [place] [address] Phone: [phone] Fax: [phone]  [place] ([place]), [place] - [address] [place] [address] Phone: [phone] Fax: [phone]  [facility] - [place], [place] - [address] [place] [address] Phone: [phone] Fax: [phone]  Uses pill box? No - at this time Ms. Smiths son spends over an hour preparing her medications everyday.  Pt endorses 85% compliance  We discussed: Benefits of medication synchronization, packaging and delivery as well as enhanced pharmacist oversight with [facility]. Patient decided to: Utilize [facility] pharmacy for medication synchronization, packaging and delivery  Care Plan and Follow Up Patient Decision:  Patient agrees to Care Plan and Follow-up.  Plan: The patient has been provided with contact information for the care management team and has been advised to call with any health related questions or concerns.  and The care management team will reach out to the patient again over the next 5 days. Patient and son to have a discussion with care guide to review current medications and confirm supply.   Orlando Penner, PharmD Clinical Pharmacist [facility] [phone]    Current Barriers:  Unable to independently monitor therapeutic efficacy Unable to self administer medications as prescribed Does not adhere to prescribed medication regimen  Pharmacist Clinical Goal(s):  Patient will achieve adherence to monitoring guidelines and medication adherence to achieve therapeutic efficacy through collaboration with PharmD and provider.   Interventions: 1:1 collaboration with Sara Brine, FNP regarding development and update of comprehensive plan of care as evidenced by provider attestation and co-signature Inter-disciplinary care team collaboration (see longitudinal plan of care) Comprehensive medication review performed; medication list updated in electronic medical record  Hypertension (BP  goal <140/90) -Controlled -Current  treatment: Metoprolol Tartrate 25 mg tablet twice per day -Medications previously tried: none noted   -Current home readings: patients BP is not being checked at this time -Current dietary habits: patient is eating well trying to make sure she is healthy.  -Current exercise habits: not currently exercising.  -Denies hypotensive/hypertensive symptoms -Educated on Importance of home blood pressure monitoring; Proper BP monitoring technique; -Counseled to monitor BP at home at least once per week, document, and provide log at future appointments -Recommended to continue current medication  Hyperlipidemia: (LDL goal < 70) -Uncontrolled -Current treatment: Rosuvastatin 40 mg tablet once per day  -Current dietary patterns: patient is not eating fried and fatty foods -Current exercise habits: Patient is not currently exercising.  -Educated on Benefits of statin for ASCVD risk reduction; Importance of limiting foods high in cholesterol; -Recommended to continue current medication  Patient Goals/Self-Care Activities Patient will:  - take medications as prescribed  Follow Up Plan: The patient has been provided with contact information for the care management team and has been advised to call with any health related questions or concerns.

## 2020-10-15 ENCOUNTER — Ambulatory Visit: Payer: Medicare Other

## 2020-10-15 DIAGNOSIS — I10 Essential (primary) hypertension: Secondary | ICD-10-CM

## 2020-10-15 DIAGNOSIS — I5022 Chronic systolic (congestive) heart failure: Secondary | ICD-10-CM | POA: Diagnosis not present

## 2020-10-15 DIAGNOSIS — G3184 Mild cognitive impairment, so stated: Secondary | ICD-10-CM

## 2020-10-15 DIAGNOSIS — N1831 Chronic kidney disease, stage 3a: Secondary | ICD-10-CM | POA: Diagnosis not present

## 2020-10-15 DIAGNOSIS — E7800 Pure hypercholesterolemia, unspecified: Secondary | ICD-10-CM

## 2020-10-15 DIAGNOSIS — I25118 Atherosclerotic heart disease of native coronary artery with other forms of angina pectoris: Secondary | ICD-10-CM

## 2020-10-15 NOTE — Chronic Care Management (AMB) (Signed)
Chronic Care Management    Social Work Note  10/15/2020 Name: Sara Fox MRN: [id number] DOB: [birth year]  Sara Fox is a 80 y.o. year old female who is a primary care patient of [name], [place]. The CCM team was consulted to assist the patient with chronic disease management and/or care coordination needs related to: [employer] .   Engaged with patients son Sara Fox by phone for follow up visit in response to provider referral for social work chronic care management and care coordination services.   Consent to Services:  The patient was given information about Chronic Care Management services, agreed to services, and gave verbal consent prior to initiation of services.  Please see initial visit note for detailed documentation.   Patient agreed to services and consent obtained.   Assessment: Review of patient past medical history, allergies, medications, and health status, including review of relevant consultants reports was performed today as part of a comprehensive evaluation and provision of chronic care management and care coordination services.     SDOH (Social Determinants of Health) assessments and interventions performed:    Advanced Directives Status: Not addressed in this encounter.  CCM Care Plan  Allergies  Allergen Reactions  . Buprenorphine Hcl Shortness Of Breath    Throat swelling/trouble breathing and lethargic  . Morphine And Related Shortness Of Breath    Throat swelling/trouble breathing and lethargic  . Celebrex [Celecoxib] Diarrhea and Swelling    Swelling of legs   . Vioxx [Rofecoxib] Palpitations  . Codeine Other (See Comments)    Bloated     Outpatient Encounter Medications as of 10/15/2020  Medication Sig Note  . aspirin EC 81 MG tablet Take 81 mg by mouth daily. 05/24/2020: On hold due to upcoming procedure.  . Calcium Citrate-Vitamin D (CALCIUM CITRATE+D3 PETITES PO) Take 1 tablet by mouth daily.   . cholecalciferol (VITAMIN D)  25 MCG (1000 UNIT) tablet Take 1,000 Units by mouth daily.   . ciclopirox (PENLAC) 8 % solution Apply 1 application topically at bedtime. Apply over nail and surrounding skin. Apply daily over previous coat. After seven (7) days, file nail and continue cycle.   . CVS MAGNESIUM OXIDE 250 MG TABS TAKE 1 TABLET BY MOUTH WITH EVENING MEAL DAILY   . donepezil (ARICEPT) 10 MG tablet Take 1 tablet (10 mg total) by mouth at bedtime.   . ferrous sulfate 325 (65 FE) MG tablet Take 325 mg by mouth daily with breakfast.   . furosemide (LASIX) 20 MG tablet TAKE 1 TABLET BY MOUTH EVERY OTHER DAY   . gabapentin (NEURONTIN) 100 MG capsule Take 1 capsule (100 mg total) by mouth 2 (two) times daily.   . Magnesium 250 MG TABS Take 250 mg by mouth every evening.   . metoprolol tartrate (LOPRESSOR) 25 MG tablet TAKE 1 TABLET BY MOUTH TWICE A DAY   . Multiple Vitamin (MULTIVITAMIN WITH MINERALS) TABS tablet Take 1 tablet by mouth daily.   . nitroGLYCERIN (NITROSTAT) 0.4 MG SL tablet PLACE ONE TABLET UNDER THE TONGUE EVERY 5 MINUTES AS NEEDED FOR CHEST PAIN.   Marland Kitchen oxybutynin (DITROPAN) 5 MG tablet TAKE 1 TABLET BY MOUTH TWICE A DAY   . pantoprazole (PROTONIX) 40 MG tablet Take 1 tablet (40 mg total) by mouth 2 (two) times daily.   . polyethylene glycol (MIRALAX / GLYCOLAX) packet Take 17 g by mouth daily as needed for mild constipation.   . Potassium Chloride ER 20 MEQ TBCR TAKE 2 TABLETS BY MOUTH  EVERY MORNING (Patient taking differently: Take 40 mEq by mouth daily.)   . QUEtiapine (SEROQUEL) 25 MG tablet TAKE 1 TABLET BY MOUTH EVERYDAY AT BEDTIME   . rosuvastatin (CRESTOR) 40 MG tablet Take 1 tablet (40 mg total) by mouth every evening.   . SYMBICORT 160-4.5 MCG/ACT inhaler TAKE 2 PUFFS BY MOUTH TWICE A DAY   . traMADol (ULTRAM) 50 MG tablet TAKE 1 TABLET 3 TIMES A DAY AND CAN TAKE 1 EXTRA 20 DAYS/MONTH    No facility-administered encounter medications on file as of 10/15/2020.    Patient Active Problem List    Diagnosis Date Noted  . Non-small cell carcinoma of right lung, stage 1 ([place]) 06/08/2020  . Pulmonary nodule 1 cm or greater in diameter 05/21/2020  . Atherosclerotic heart disease of native coronary artery with other forms of angina pectoris ([place]) 04/22/2020  . Fatigue 02/18/2019  . Memory loss 02/18/2019  . Vitamin B12 deficiency 10/23/2018  . Hypotension 10/23/2018  . Stage 3 chronic kidney disease ([place]) 06/03/2018  . Chronic anemia 06/03/2018  . Hypercholesterolemia 06/03/2018  . Malnutrition of moderate degree 07/12/2017  . Hypoalbuminemia 07/11/2017  . Elevated LFTs 07/11/2017  . Closed fracture of left distal radius 05/28/2017  . Snoring 12/21/2016  . OSA and COPD overlap syndrome ([place]) 12/21/2016  . Peripheral musculoskeletal gait disorder 02/05/2015  . Lumbar post-laminectomy syndrome 02/05/2015  . CAD in native artery 10/16/2014  . NSTEMI (non-ST elevated myocardial infarction) ([place]) 09/13/2014  . Cardiomyopathy, ischemic 09/13/2014  . Chronic systolic heart failure ([place])   . Tobacco abuse 09/12/2014  . Dyslipidemia 09/12/2014  . CKD (chronic kidney disease), stage II 09/12/2014  . Normocytic anemia 09/12/2014  . Chronic diastolic heart failure, NYHA class 1 ([place]) 09/12/2014  . Postlaminectomy syndrome, cervical region 08/01/2013  . Chronic midline low back pain with left-sided sciatica 08/01/2013  . Shoulder joint contracture 08/01/2013  . HTN (hypertension) 03/03/2013  . Abnormal genetic test 03/03/2013    Conditions to be addressed/monitored: CHF, HTN, CKD Stage III and Cognitive Impairment; Limited access to caregiver  Care Plan : Social Work [place]  Updates made by [name] since 10/15/2020 12:00 AM    Problem: Barriers to Treatment     Long-Range Goal: Barriers to Treatment Identified and Managed   Start Date: 06/01/2020  Expected End Date: 11/29/2020  This Visit's Progress: On track  Recent Progress: On track  Priority: Medium  Note:    Current Barriers:  . Chronic disease management support and education needs related to CHF, HTN, CKD Stage III, and Cognitive Impairment   . ADL IADL limitations . Limited access to caregiver . Memory Deficits  Social Worker Clinical Goal(s):  Marland Kitchen Over the next 120 days, patient will work with SW to identify and address any acute and/or chronic care coordination needs related to the self health management of CHF, HTN, CKD Stage III, and Cognitive Impairment   . New 6.3.22 Patient and her son will follow up with DSS regarding Medicaid application status as directed by SW  CCM SW Interventions:  . Inter-disciplinary care team collaboration (see longitudinal plan of care) . Collaboration with [name], FNP regarding development and update of comprehensive plan of care as evidenced by provider attestation and co-signature . Inbound call received from patients primary care provider, [name] FNP to discuss concerns expressed by patients daughter, [name] regarding the need for placement due to patients increased agitation and difficulty getting the patient to radiation treatment . Successful outbound call placed to  the patients son Sara Fox to assess goal progression . Discussed SW has yet to receive a return call from DSS regarding status of patients Medicaid application - [name] reports he has not been successful with calling DSS either due to not receiving return calls . Determined [name] will plan to visit DSS in person to follow up on the status of patients Medicaid application . Assessed for goal regarding outcome of Medicaid application - [name] indicates he is hoping patient will qualify for Medicaid in order to receive personal care services in the home . Reviewed opportunity to enroll patient in [facility] to assist with patient care needs . Discussed the patient does not want to leave her primary care provider and is therefore not interested in [facility] enrollment at  this time . Assessed for status of patients Food and Nutrition assistance application . [name] reports he did receive Social Security reward letter by mail and successfully submitted to Food and Nutrition Services - patient has been approved for assistance . Discussed plans for SW to follow up with [name] over the next month to assess goal progression . Encouraged [name] to contact SW as needed prior to next scheduled call  Patient Goals/Self-Care Activities . Over the next 30 days, patient will: with the help of her son  -  Contact SW as needed prior to next scheduled call - Visit local DSS in person to follow up on Medicaid application status  Follow Up Plan:  SW will follow up with the patient over the next month    Problem: Home and Family Safety Resolved 10/15/2020       Follow Up Plan: SW will follow up with patient by phone over the next month      [name], BSW, CDP Social Worker, Certified Dementia Practitioner [facility] [phone]  Total time spent performing care coordination and/or care management activities with the patient by phone or face to face = 30 minutes.

## 2020-10-15 NOTE — Patient Instructions (Signed)
Social Worker Visit Information  Goals we discussed today:  Goals Addressed            This Visit's Progress   . Work with SW to identify and manage barriers to treatment       Timeframe:  Long-Range Goal Priority:  [employer] Start Date:  1.18.22                           Expected End Date: 7.18.22                    Next planned outreach date: 7.1.22  Patient Goals/Self-Care Activities . Over the next 30 days, patient will: with the help of her son  - Contact SW as needed prior to next scheduled call - Visit local DSS in person to follow up on Medicaid application status        Materials Provided: Verbal education about community resources provided by phone  Follow Up Plan: SW will follow up with patient by phone over the next month   [name], BSW, CDP Social Worker, Certified Dementia Practitioner [place] [phone]

## 2020-10-25 ENCOUNTER — Telehealth: Payer: Self-pay | Admitting: Internal Medicine

## 2020-10-25 MED ORDER — NITROGLYCERIN 0.4 MG SL SUBL: SUBLINGUAL_TABLET | SUBLINGUAL | 1 refills | Status: DC

## 2020-10-25 NOTE — Telephone Encounter (Signed)
*  STAT* If patient is at the pharmacy, call can be transferred to refill team.   1. Which medications need to be refilled? (please list name of each medication and dose if known) nitroGLYCERIN (NITROSTAT) 0.4 MG SL tablet  2. Which pharmacy/location (including street and city if local pharmacy) is medication to be sent to? [id number] [place] [place] - [address] AT [place]  3. Do they need a 30 day or 90 day supply? 90 day supply    PT is out of this medication

## 2020-10-28 ENCOUNTER — Other Ambulatory Visit: Payer: Self-pay | Admitting: Internal Medicine

## 2020-10-28 ENCOUNTER — Ambulatory Visit (HOSPITAL_COMMUNITY)
Admission: RE | Admit: 2020-10-28 | Discharge: 2020-10-28 | Disposition: A | Discharge status: Home / Self Care | Payer: Medicare Other | Source: Ambulatory Visit | Attending: Internal Medicine | Admitting: Internal Medicine

## 2020-10-28 ENCOUNTER — Other Ambulatory Visit: Payer: Self-pay

## 2020-10-28 ENCOUNTER — Inpatient Hospital Stay: Payer: Medicare Other | Attending: Internal Medicine

## 2020-10-28 DIAGNOSIS — C3411 Malignant neoplasm of upper lobe, right bronchus or lung: Secondary | ICD-10-CM | POA: Diagnosis not present

## 2020-10-28 DIAGNOSIS — C3490 Malignant neoplasm of unspecified part of unspecified bronchus or lung: Secondary | ICD-10-CM

## 2020-10-28 LAB — CBC WITH DIFFERENTIAL (CANCER CENTER ONLY)
Abs Immature Granulocytes: 0.01 10*3/uL (ref 0.00–0.07)
Basophils Absolute: 0 10*3/uL (ref 0.0–0.1)
Basophils Relative: 0 %
Eosinophils Absolute: 0.1 10*3/uL (ref 0.0–0.5)
Eosinophils Relative: 4 %
HCT: 34.7 % — ABNORMAL LOW (ref 36.0–46.0)
Hemoglobin: 10.5 g/dL — ABNORMAL LOW (ref 12.0–15.0)
Immature Granulocytes: 0 %
Lymphocytes Relative: 20 %
Lymphs Abs: 0.8 10*3/uL (ref 0.7–4.0)
MCH: 30.6 pg (ref 26.0–34.0)
MCHC: 30.3 g/dL (ref 30.0–36.0)
MCV: 101.2 fL — ABNORMAL HIGH (ref 80.0–100.0)
Monocytes Absolute: 0.6 10*3/uL (ref 0.1–1.0)
Monocytes Relative: 14 %
Neutro Abs: 2.4 10*3/uL (ref 1.7–7.7)
Neutrophils Relative %: 62 %
Platelet Count: 221 10*3/uL (ref 150–400)
RBC: 3.43 MIL/uL — ABNORMAL LOW (ref 3.87–5.11)
RDW: 13 % (ref 11.5–15.5)
WBC Count: 4 10*3/uL (ref 4.0–10.5)
nRBC: 0 % (ref 0.0–0.2)

## 2020-10-28 LAB — CMP (CANCER CENTER ONLY)
ALT: 15 U/L (ref 0–44)
AST: 21 U/L (ref 15–41)
Albumin: 3.6 g/dL (ref 3.5–5.0)
Alkaline Phosphatase: 107 U/L (ref 38–126)
Anion gap: 8 (ref 5–15)
BUN: 28 mg/dL — ABNORMAL HIGH (ref 8–23)
CO2: 22 mmol/L (ref 22–32)
Calcium: 9.3 mg/dL (ref 8.9–10.3)
Chloride: 111 mmol/L (ref 98–111)
Creatinine: 1.72 mg/dL — ABNORMAL HIGH (ref 0.44–1.00)
GFR, Estimated: 30 mL/min — ABNORMAL LOW (ref >60)
Glucose, Bld: 97 mg/dL (ref 70–99)
Potassium: 5 mmol/L (ref 3.5–5.1)
Sodium: 141 mmol/L (ref 135–145)
Total Bilirubin: 0.5 mg/dL (ref 0.3–1.2)
Total Protein: 6.9 g/dL (ref 6.5–8.1)

## 2020-10-30 ENCOUNTER — Other Ambulatory Visit: Payer: Self-pay | Admitting: Internal Medicine

## 2020-11-01 ENCOUNTER — Inpatient Hospital Stay: Payer: Medicare Other | Admitting: Internal Medicine

## 2020-11-01 ENCOUNTER — Telehealth: Payer: Self-pay

## 2020-11-01 ENCOUNTER — Other Ambulatory Visit: Payer: Self-pay

## 2020-11-01 VITALS — BP 118/69 | HR 57 | Temp 97.6°F | Resp 18 | Ht 65.0 in | Wt 124.4 lb

## 2020-11-01 DIAGNOSIS — C3490 Malignant neoplasm of unspecified part of unspecified bronchus or lung: Secondary | ICD-10-CM | POA: Diagnosis not present

## 2020-11-01 DIAGNOSIS — C3411 Malignant neoplasm of upper lobe, right bronchus or lung: Secondary | ICD-10-CM | POA: Diagnosis not present

## 2020-11-01 DIAGNOSIS — C3491 Malignant neoplasm of unspecified part of right bronchus or lung: Secondary | ICD-10-CM | POA: Diagnosis not present

## 2020-11-01 NOTE — Progress Notes (Signed)
[facility] Telephone:([phone]) [phone]   Fax:([phone]) [phone]  OFFICE PROGRESS NOTE  Sara Fox, [address] [place] [address]  DIAGNOSIS: Stage IIA (T2b, N0, M0) non-small cell lung cancer with nonspecific subtype diagnosed in November 2021 and presenting with cystic/solid right upper lobe lung mass with a suspicious small lower right paratracheal lymph node  PRIOR THERAPY: Status post curative radiotherapy under the care of Dr. Sondra Come completed on 08/16/2020.  CURRENT THERAPY: Observation.  INTERVAL HISTORY: Sara Fox 80 y.o. female returns to the clinic today for follow-up visit.  She was accompanied by her son.  The patient is feeling fine today with no concerning complaints except for lack of sleep.  She denied having any current chest pain, shortness of breath but has mild cough with no hemoptysis.  She denied having any fever or chills.  She has no nausea, vomiting, diarrhea or constipation.  She has no headache or visual changes.  She tolerated her previous radiotherapy fairly well.  The patient is here today for evaluation and repeat CT scan of the chest for restaging of her disease.  MEDICAL HISTORY: Past Medical History:  Diagnosis Date   Acute kidney injury ([place])    Back pain    Cervical cancer (H[facility])    CHF (congestive heart failure) (H[facility])    COPD (chronic obstructive pulmonary disease) ([place])    Coronary artery disease    s/p DES x2 to RCA 2016   GERD (gastroesophageal reflux disease)    History of radiation therapy 07/27/20-08/16/20   Right lung- SBRT- Dr. Gery Pray    Hyperlipidemia    Hypertension    Mild cognitive impairment    Myocardial infarction [facility])    Neck pain    Rhabdomyolysis    Stomach problems     ALLERGIES:  is allergic to buprenorphine hcl, morphine and related, celebrex [celecoxib], vioxx [rofecoxib], and codeine.  MEDICATIONS:  Current Outpatient Medications  Medication Sig Dispense Refill    aspirin EC 81 MG tablet Take 81 mg by mouth daily.     Calcium Citrate-Vitamin D (CALCIUM CITRATE+D3 PETITES PO) Take 1 tablet by mouth daily.     cholecalciferol (VITAMIN D) 25 MCG (1000 UNIT) tablet Take 1,000 Units by mouth daily.     ciclopirox (PENLAC) 8 % solution Apply 1 application topically at bedtime. Apply over nail and surrounding skin. Apply daily over previous coat. After seven (7) days, file nail and continue cycle.     CVS MAGNESIUM OXIDE 250 MG TABS TAKE 1 TABLET BY MOUTH WITH EVENING MEAL DAILY 90 tablet 1   donepezil (ARICEPT) 10 MG tablet Take 1 tablet (10 mg total) by mouth at bedtime. 90 tablet 1   ferrous sulfate 325 (65 FE) MG tablet Take 325 mg by mouth daily with breakfast.     furosemide (LASIX) 20 MG tablet TAKE 1 TABLET BY MOUTH EVERY OTHER DAY 45 tablet 1   gabapentin (NEURONTIN) 100 MG capsule Take 1 capsule (100 mg total) by mouth 2 (two) times daily. 180 capsule 1   Magnesium 250 MG TABS Take 250 mg by mouth every evening.     metoprolol tartrate (LOPRESSOR) 25 MG tablet TAKE 1 TABLET BY MOUTH TWICE A DAY 180 tablet 1   Multiple Vitamin (MULTIVITAMIN WITH MINERALS) TABS tablet Take 1 tablet by mouth daily.     nitroGLYCERIN (NITROSTAT) 0.4 MG SL tablet PLACE ONE TABLET UNDER THE TONGUE EVERY 5 MINUTES AS NEEDED FOR CHEST PAIN.  25 tablet 1   oxybutynin (DITROPAN) 5 MG tablet TAKE 1 TABLET BY MOUTH TWICE A DAY 180 tablet 1   pantoprazole (PROTONIX) 40 MG tablet Take 1 tablet (40 mg total) by mouth 2 (two) times daily. 180 tablet 1   polyethylene glycol (MIRALAX / GLYCOLAX) packet Take 17 g by mouth daily as needed for mild constipation.     Potassium Chloride ER 20 MEQ TBCR TAKE 2 TABLETS BY MOUTH EVERY MORNING (Patient taking differently: Take 40 mEq by mouth daily.) 180 tablet 3   QUEtiapine (SEROQUEL) 25 MG tablet TAKE 1 TABLET BY MOUTH EVERYDAY AT BEDTIME 90 tablet 2   rosuvastatin (CRESTOR) 40 MG tablet Take 1 tablet (40 mg total) by mouth every evening.      SYMBICORT 160-4.5 MCG/ACT inhaler TAKE 2 PUFFS BY MOUTH TWICE A DAY 30.6 each 1   traMADol (ULTRAM) 50 MG tablet TAKE 1 TABLET 3 TIMES A DAY AND CAN TAKE 1 EXTRA 20 DAYS/MONTH 110 tablet 2   No current facility-administered medications for this visit.    SURGICAL HISTORY:  Past Surgical History:  Procedure Laterality Date   ABDOMINAL HYSTERECTOMY     BACK SURGERY  2012   lower back   BRONCHIAL BIOPSY  05/25/2020   Procedure: BRONCHIAL BIOPSIES;  Surgeon: Collene Gobble, MD;  Location: [facility] ENDOSCOPY;  Service: Pulmonary;;   BRONCHIAL BRUSHINGS  05/25/2020   Procedure: BRONCHIAL BRUSHINGS;  Surgeon: Collene Gobble, MD;  Location: [facility] ENDOSCOPY;  Service: Pulmonary;;   BRONCHIAL NEEDLE ASPIRATION BIOPSY  05/25/2020   Procedure: BRONCHIAL NEEDLE ASPIRATION BIOPSIES;  Surgeon: Collene Gobble, MD;  Location: [facility] ENDOSCOPY;  Service: Pulmonary;;   CARDIAC CATHETERIZATION  06/1999   noncritical disease invovling PDA   CARDIAC CATHETERIZATION N/A 09/14/2014   Procedure: Left Heart Cath and Coronary Angiography;  Surgeon: Leonie Man, MD;  Location: [facility] INVASIVE CV LAB CUPID;  Service: Cardiovascular;  Laterality: N/A;   CHOLECYSTECTOMY     ESOPHAGOGASTRODUODENOSCOPY (EGD) WITH PROPOFOL Left 07/19/2017   Procedure: ESOPHAGOGASTRODUODENOSCOPY (EGD) WITH PROPOFOL;  Surgeon: Ronnette Juniper, MD;  Location: [facility] ENDOSCOPY;  Service: Gastroenterology;  Laterality: Left;   FINE NEEDLE ASPIRATION  05/25/2020   Procedure: FINE NEEDLE ASPIRATION (FNA) LINEAR;  Surgeon: Collene Gobble, MD;  Location: [facility] ENDOSCOPY;  Service: Pulmonary;;   FRACTURE SURGERY Left 05/2017   left wrist   HARDWARE REMOVAL Left 11/07/2017   Procedure: LEFT WRIST HARDWARE REMOVAL;  Surgeon: Charlotte Crumb, MD;  Location: [facility];  Service: Orthopedics;  Laterality: Left;   KNEE SURGERY Bilateral 2001 & 2007   NECK SURGERY  2012   2012   PARTIAL GASTRECTOMY  2005   subtotal   PERCUTANEOUS CORONARY STENT INTERVENTION (PCI-S)  09/14/2014    Procedure: Percutaneous Coronary Stent Intervention (Pci-S);  Surgeon: Leonie Man, MD;  [facility] INVASIVE CV LAB CUPID;  Service: Cardiovascular;;   SHOULDER SURGERY Right    TRANSTHORACIC ECHOCARDIOGRAM  06/02/2010   EF=>55%, normal LV systolic function; normal RV systolic function; mild mitral annular calcif; trace TR; AV mildly sclerotic   VIDEO BRONCHOSCOPY WITH ENDOBRONCHIAL NAVIGATION N/A 05/25/2020   Procedure: VIDEO BRONCHOSCOPY WITH ENDOBRONCHIAL NAVIGATION;  Surgeon: Collene Gobble, MD;  Location: [facility] ENDOSCOPY;  Service: Pulmonary;  Laterality: N/A;   VIDEO BRONCHOSCOPY WITH ENDOBRONCHIAL ULTRASOUND N/A 05/25/2020   Procedure: VIDEO BRONCHOSCOPY WITH ENDOBRONCHIAL ULTRASOUND;  Surgeon: Collene Gobble, MD;  Location: [facility] ENDOSCOPY;  Service: Pulmonary;  Laterality: N/A;   WRIST OSTEOTOMY Left 11/07/2017   Procedure: LEFT WRIST  DISTAL ULNA RESECTION WITH EXTENSOR CARPI ULNARIS STABILIZATION;  Surgeon: Charlotte Crumb, MD;  Location: [facility];  Service: Orthopedics;  Laterality: Left;    REVIEW OF SYSTEMS:  A comprehensive review of systems was negative except for: Constitutional: positive for fatigue Respiratory: positive for cough Behavioral/Psych: positive for sleep disturbance   PHYSICAL EXAMINATION: General appearance: alert, cooperative, and appears older than stated age Head: Normocephalic, without obvious abnormality, atraumatic Neck: no adenopathy, no JVD, supple, symmetrical, trachea midline, and thyroid not enlarged, symmetric, no tenderness/mass/nodules Lymph nodes: Cervical, supraclavicular, and axillary nodes normal. Resp: clear to auscultation bilaterally Back: symmetric, no curvature. ROM normal. No CVA tenderness. Cardio: regular rate and rhythm, S1, S2 normal, no murmur, click, rub or gallop GI: soft, non-tender; bowel sounds normal; no masses,  no organomegaly Extremities: extremities normal, atraumatic, no cyanosis or edema  ECOG PERFORMANCE STATUS: 1 -  Symptomatic but completely ambulatory  Blood pressure 118/69, pulse (!) 57, temperature 97.6 F (36.4 C), temperature source Tympanic, resp. rate 18, height 5\' 5"  (1.651 m), weight 124 lb 6.4 oz (56.4 kg), SpO2 99 %.  LABORATORY DATA: Lab Results  Component Value Date   WBC 4.0 10/28/2020   HGB 10.5 (L) 10/28/2020   HCT 34.7 (L) 10/28/2020   MCV 101.2 (H) 10/28/2020   PLT 221 10/28/2020      Chemistry      Component Value Date/Time   NA 141 10/28/2020 1002   NA 143 04/22/2020 0957   K 5.0 10/28/2020 1002   CL 111 10/28/2020 1002   CO2 22 10/28/2020 1002   BUN 28 (H) 10/28/2020 1002   BUN 19 04/22/2020 0957   CREATININE 1.72 (H) 10/28/2020 1002      Component Value Date/Time   CALCIUM 9.3 10/28/2020 1002   ALKPHOS 107 10/28/2020 1002   AST 21 10/28/2020 1002   ALT 15 10/28/2020 1002   BILITOT 0.5 10/28/2020 1002       RADIOGRAPHIC STUDIES: CT CHEST WO CONTRAST  Result Date: 10/28/2020 CLINICAL DATA:  Non-small cell lung cancer restaging EXAM: CT CHEST WITHOUT CONTRAST TECHNIQUE: Multidetector CT imaging of the chest was performed following the standard protocol without IV contrast. COMPARISON:  PET-CT, 06/21/2020 FINDINGS: Cardiovascular: Aortic atherosclerosis. Normal heart size. Three-vessel coronary artery calcifications and stents. No pericardial effusion. Mediastinum/Nodes: No enlarged mediastinal, hilar, or axillary lymph nodes. Thyroid gland, trachea, and esophagus demonstrate no significant findings. Lungs/Pleura: Mild centrilobular and paraseptal emphysema. No significant change in an irregular, heterogeneous lesion of the posterior right pulmonary apex, measuring approximately 3.4 x 3.6 cm in total extent (series 7, image 29). Additional sub solid nodule of the superior segment left lower lobe is unchanged, measuring approximately 6 mm (series 7, image 32) no pleural effusion or pneumothorax. Upper Abdomen: No acute abnormality. Musculoskeletal: No chest wall mass or  suspicious bone lesions identified. IMPRESSION: 1. No significant change in an irregular, heterogeneous lesion of the posterior right pulmonary apex, measuring approximately 3.4 x 3.6 cm in total extent. Although generally somewhat infectious or inflammatory in appearance, this represents a biopsy proven malignancy. 2. Unchanged nonspecific 6 mm subsolid nodule of the superior segment left lower lobe. Continued attention on follow-up. 3. No evidence of lymphadenopathy or metastatic disease in the chest. 4. Emphysema. 5. Coronary artery disease. Aortic Atherosclerosis (ICD10-I70.0) and Emphysema (ICD10-J43.9). Electronically Signed   By: Eddie Candle M.D.   On: 10/28/2020 16:54     ASSESSMENT AND PLAN: This is a very pleasant 80 years old African-American female with a stage IIa (T2b,  N0, M0) non-small cell lung cancer with unspecified histologic subtype diagnosed in January 2022. The patient had a PET scan as well as MRI of the brain performed recently.  The PET scan showed low-level FDG uptake associated with the ill-defined subsolid 4.5 cm posterior apical right upper lobe lung mass compatible with the non-small cell malignancy.  There was no other hypermetabolic activity in the thoracic adenopathy or distant metastatic disease.   The patient underwent curative radiotherapy under the care of Dr. Sondra Come. She tolerated the treatment well with no concerning adverse effects. She is here today for evaluation with repeat CT scan of the chest for restaging of her disease. I discussed the scan results with the patient and her son.  Her scan showed no concerning findings for disease progression. I recommended for her to continue on observation with repeat CT scan of the chest in 6 months. The patient was advised to call immediately if she has any concerning symptoms in the interval. The patient voices understanding of current disease status and treatment options and is in agreement with the current care  plan.  All questions were answered. The patient knows to call the clinic with any problems, questions or concerns. We can certainly see the patient much sooner if necessary.  The total time spent in the appointment was 35 minutes.  Disclaimer: This note was dictated with voice recognition software. Similar sounding words can inadvertently be transcribed and may not be corrected upon review.

## 2020-11-01 NOTE — Chronic Care Management (AMB) (Signed)
Chronic Care Management Pharmacy Assistant   Name: Sara Fox  MRN: [id number] DOB: 1940/06/21  Reason for Encounter: Medication Coordination of Enhanced Pharmacy Services   11/01/2020- Called son to discuss patient mediations to start her with [facility] Adherence Packaging program. Son was at work, patient has a follow up appointment with PCP [name], [place] 11/02/2020 at 5 am. Patient will bring all medications and supplements for pharmacist or assistant to count day supply left. Onboarding form filled in, Medication Cost Analysis completed, patient has no change in medication pricing starting with [facility].  11/02/2020- Met with patient and son to count medication on hand, patient had several duplicate bottles of medications and son also had 2 weeks of her medication in pill boxes. Medications on hand:  Rosuvastatin 40 mg- 1 tablet daily- 2 unopened bottle #30 and #52  Nitroglycerin 0.4 mg sl- 1 under tongue as needed for chest pains- PRN  Oxybutynin 5 mg- 1 full bottle #180 and 2nd bottle #180 (Clarifying directions)  Gabapentin 100 mg- 1 tablet daily-#106  Quetiapine 50 mg- 1 tablet daily- increased dose prescribed 6/21- #90  Donepezil 10 mg- 1 tablet daily- #4 left, picking up prescription 6/21  Symbicort 160-4.5 mcg- 1 puff twice daily  Furosemide 20 mg- 1 tablet on Sunday, Tuesday and Thursday morning- #105  Magnesium Oxide 250 mg- 1 tablet daily- #106  Metoprolol Tartrate 25 mg- 1 tablet twice daily- #140  Pantoprazole 40 mg- 1 tablet daily- #135  Potassium ER 20 meq- 1 tablet twice daily- 1 unopened bottle of #180 and #70  Tramadol 50 mg- PRN #6 left  Namenda 68m- 1 tablet daily- Prescribed 6/21- #30   11/10/2020- Clarification of Oxybutynin sent to PCP on directions, waiting on response to determine when medication is due.  11/11/2020- Clarification received from PCP on Oxybutynin 5 mg, patient is to take 1/2 tablet twice a day, CMA will send new  prescription to [facility] to place on hold. Called son to update, no answer, left message to return call. Also need clarification on Symbicort inhaler. Son called back, missed call, called back, no answer, left message to return call.     Medications: Outpatient Encounter Medications as of 11/01/2020  Medication Sig   aspirin EC 81 MG tablet Take 81 mg by mouth daily.   Calcium Citrate-Vitamin D (CALCIUM CITRATE+D3 PETITES PO) Take 1 tablet by mouth daily.   cholecalciferol (VITAMIN D) 25 MCG (1000 UNIT) tablet Take 1,000 Units by mouth daily.   ciclopirox (PENLAC) 8 % solution Apply 1 application topically at bedtime. Apply over nail and surrounding skin. Apply daily over previous coat. After seven (7) days, file nail and continue cycle.   [facility] MAGNESIUM OXIDE 250 MG TABS TAKE 1 TABLET BY MOUTH WITH EVENING MEAL DAILY   donepezil (ARICEPT) 10 MG tablet Take 1 tablet (10 mg total) by mouth at bedtime.   ferrous sulfate 325 (65 FE) MG tablet Take 325 mg by mouth daily with breakfast.   furosemide (LASIX) 20 MG tablet TAKE 1 TABLET BY MOUTH EVERY OTHER DAY   gabapentin (NEURONTIN) 100 MG capsule Take 1 capsule (100 mg total) by mouth 2 (two) times daily.   Magnesium 250 MG TABS Take 250 mg by mouth every evening.   metoprolol tartrate (LOPRESSOR) 25 MG tablet TAKE 1 TABLET BY MOUTH TWICE A DAY   Multiple Vitamin (MULTIVITAMIN WITH MINERALS) TABS tablet Take 1 tablet by mouth daily.   nitroGLYCERIN (NITROSTAT) 0.4 MG SL tablet PLACE ONE  TABLET UNDER THE TONGUE EVERY 5 MINUTES AS NEEDED FOR CHEST PAIN.   oxybutynin (DITROPAN) 5 MG tablet TAKE 1 TABLET BY MOUTH TWICE A DAY   pantoprazole (PROTONIX) 40 MG tablet Take 1 tablet (40 mg total) by mouth 2 (two) times daily.   polyethylene glycol (MIRALAX / GLYCOLAX) packet Take 17 g by mouth daily as needed for mild constipation.   Potassium Chloride ER 20 MEQ TBCR TAKE 2 TABLETS BY MOUTH EVERY MORNING (Patient taking differently: Take 40 mEq by  mouth daily.)   QUEtiapine (SEROQUEL) 25 MG tablet TAKE 1 TABLET BY MOUTH EVERYDAY AT BEDTIME   rosuvastatin (CRESTOR) 40 MG tablet TAKE 1 TABLET BY MOUTH ONCE DAILY   SYMBICORT 160-4.5 MCG/ACT inhaler TAKE 2 PUFFS BY MOUTH TWICE A DAY   traMADol (ULTRAM) 50 MG tablet TAKE 1 TABLET 3 TIMES A DAY AND CAN TAKE 1 EXTRA 20 DAYS/MONTH   No facility-administered encounter medications on file as of 11/01/2020.    Care Gaps: 12/15/2020- Annual Wellness Visit   Star Rating Drugs: Rosuvastatin 40 mg- Last filled 11/01/2020 for [facility]   SIG: Pattricia Boss, [place] Pharmacist Assistant [phone]

## 2020-11-02 ENCOUNTER — Encounter: Payer: Self-pay | Admitting: Nurse Practitioner

## 2020-11-02 ENCOUNTER — Ambulatory Visit (INDEPENDENT_AMBULATORY_CARE_PROVIDER_SITE_OTHER): Payer: Medicare Other | Admitting: Nurse Practitioner

## 2020-11-02 ENCOUNTER — Telehealth: Payer: Self-pay | Admitting: Internal Medicine

## 2020-11-02 VITALS — BP 120/84 | HR 67 | Temp 98.1°F | Ht 65.0 in | Wt 123.6 lb

## 2020-11-02 DIAGNOSIS — I25118 Atherosclerotic heart disease of native coronary artery with other forms of angina pectoris: Secondary | ICD-10-CM

## 2020-11-02 DIAGNOSIS — E7800 Pure hypercholesterolemia, unspecified: Secondary | ICD-10-CM | POA: Diagnosis not present

## 2020-11-02 DIAGNOSIS — E440 Moderate protein-calorie malnutrition: Secondary | ICD-10-CM | POA: Diagnosis not present

## 2020-11-02 DIAGNOSIS — G3184 Mild cognitive impairment, so stated: Secondary | ICD-10-CM | POA: Diagnosis not present

## 2020-11-02 MED ORDER — MEMANTINE HCL 5 MG PO TABS
5.0000 mg | ORAL_TABLET | Freq: Every day | ORAL | 2 refills | Status: DC
Start: 2020-11-02 — End: 2020-11-30

## 2020-11-02 MED ORDER — QUETIAPINE FUMARATE 50 MG PO TABS: ORAL_TABLET | ORAL | 1 refills | Status: DC

## 2020-11-02 NOTE — Progress Notes (Signed)
I,Yamilka Roman [employer] as a Education administrator for [employer], FNP.,have documented all relevant documentation on the behalf of [name], FNP,as directed by  [name], FNP while in the presence of [name], [place].   This visit occurred during the SARS-CoV-2 public health emergency.  Safety protocols were in place, including screening questions prior to the visit, additional usage of staff PPE, and extensive cleaning of exam room while observing appropriate contact time as indicated for disinfecting solutions.  Subjective:     Patient ID: Sara Fox , female    DOB: 29-Jan-1941 , 80 y.o.   MRN: [id number]   Chief Complaint  Patient presents with   Hyperlipidemia    HPI  Patient here for a f/u on her cholesterol & dementia. Her son reports she has been going out the door at times. He feels like sometimes he thinks she knows what she is doing. One morning she left the house and was sitting on the manhole.     Her son feels that when she locks the door then walks to the neighbor to get the key to the house. He does show concern that if she goes outside and get hurt  She seen Dr. Inda Merlin yesterday, no growth with the cancer and to follow up in 6 months  Hyperlipidemia This is a chronic problem. The problem is uncontrolled. There are no known factors aggravating her hyperlipidemia. Pertinent negatives include no chest pain. There are no compliance problems.  Risk factors for coronary artery disease include a sedentary lifestyle.    Past Medical History:  Diagnosis Date   Acute kidney injury ([place])    Back pain    Cervical cancer (HCC)    CHF (congestive heart failure) (HCC)    COPD (chronic obstructive pulmonary disease) (HCC)    Coronary artery disease    s/p DES x2 to RCA 2016   GERD (gastroesophageal reflux disease)    History of radiation therapy 07/27/20-08/16/20   Right lung- SBRT- Dr. Gery Pray    Hyperlipidemia    Hypertension    Mild cognitive impairment     Myocardial infarction [facility])    Neck pain    Rhabdomyolysis    Stomach problems      Family History  Problem Relation Age of Onset   Heart disease Mother        also HTN   Diabetes Mother    Colon cancer Mother    Heart disease Brother        deceased at 79   Hypertension Brother    Heart attack Brother    Heart disease Brother    Hypertension Brother     No current facility-administered medications for this visit.  Current Outpatient Medications:    cephALEXin (KEFLEX) 500 MG capsule, Take 1 capsule (500 mg total) by mouth 4 (four) times daily for 4 days., Disp: 16 capsule, Rfl: 0  Facility-Administered Medications Ordered in Other Visits:    0.9 %  sodium chloride infusion, 250 mL, Intravenous, PRN, Orma Flaming, MD   acetaminophen (TYLENOL) tablet 650 mg, 650 mg, Oral, Q6H PRN **OR** acetaminophen (TYLENOL) suppository 650 mg, 650 mg, Rectal, Q6H PRN, Orma Flaming, MD   aspirin EC tablet 81 mg, 81 mg, Oral, Daily PRN, Orma Flaming, MD   cholecalciferol (VITAMIN D3) tablet 1,000 Units, 1,000 Units, Oral, Daily, Orma Flaming, MD, 1,000 Units at 11/25/20 1116   donepezil (ARICEPT) tablet 10 mg, 10 mg, Oral, QHS, Orma Flaming, MD, 10 mg at 11/25/20 2147  enoxaparin (LOVENOX) injection 40 mg, 40 mg, Subcutaneous, Q24H, Orma Flaming, MD, 40 mg at 11/25/20 1942   ferrous sulfate tablet 325 mg, 325 mg, Oral, Q breakfast, Orma Flaming, MD, 325 mg at 11/25/20 1122   furosemide (LASIX) tablet 20 mg, 20 mg, Oral, QODAY, Samtani, Jai-Gurmukh, MD, 20 mg at 11/25/20 1122   gabapentin (NEURONTIN) capsule 100 mg, 100 mg, Oral, QHS, Samtani, Jai-Gurmukh, MD, 100 mg at 11/25/20 2147   magnesium oxide (MAG-OX) tablet 200 mg, 200 mg, Oral, Q supper, Orma Flaming, MD, 200 mg at 11/24/20 1800   memantine (NAMENDA) tablet 5 mg, 5 mg, Oral, Daily, Orma Flaming, MD, 5 mg at 11/25/20 1123   metoprolol tartrate (LOPRESSOR) tablet 25 mg, 25 mg, Oral, BID, Orma Flaming, MD, 25 mg at  11/25/20 2147   mometasone-formoterol (DULERA) 200-5 MCG/ACT inhaler 2 puff, 2 puff, Inhalation, BID, Orma Flaming, MD, 2 puff at 11/25/20 1941   multivitamin with minerals tablet 1 tablet, 1 tablet, Oral, Daily, Orma Flaming, MD, 1 tablet at 11/25/20 1120   nitroGLYCERIN (NITROSTAT) SL tablet 0.4 mg, 0.4 mg, Sublingual, Q5 min PRN, Orma Flaming, MD   oxybutynin [facility]) tablet 2.5 mg, 2.5 mg, Oral, BID, Orma Flaming, MD, 2.5 mg at 11/25/20 2148   pantoprazole (PROTONIX) EC tablet 40 mg, 40 mg, Oral, BID, Orma Flaming, MD, 40 mg at 11/25/20 2147   potassium chloride (KLOR-CON) CR tablet 40 mEq, 40 mEq, Oral, Daily, Orma Flaming, MD, 40 mEq at 11/25/20 1120   QUEtiapine (SEROQUEL) tablet 50 mg, 50 mg, Oral, QHS, Orma Flaming, MD, 50 mg at 11/25/20 2147   rosuvastatin (CRESTOR) tablet 40 mg, 40 mg, Oral, Daily, Orma Flaming, MD, 40 mg at 11/25/20 1119   sodium chloride flush (NS) 0.9 % injection 3 mL, 3 mL, Intravenous, Q12H, Orma Flaming, MD, 3 mL at 11/24/20 2121   sodium chloride flush (NS) 0.9 % injection 3 mL, 3 mL, Intravenous, PRN, Orma Flaming, MD   traMADol Veatrice Bourbon) tablet 50 mg, 50 mg, Oral, Q12H PRN, Charlynne Cousins, MD   Zinc Oxide (TRIPLE PASTE) 12.8 % ointment, , Topical, QID, Nita Sells, MD, 1 application at 93/81/82 2148   Allergies  Allergen Reactions   Buprenorphine Hcl Shortness Of Breath    Throat swelling/trouble breathing and lethargic   Morphine And Related Shortness Of Breath    Throat swelling/trouble breathing and lethargic   Celebrex [Celecoxib] Diarrhea and Swelling    Swelling of legs    Vioxx [Rofecoxib] Palpitations   Codeine Other (See Comments)    Bloated      Review of Systems  Constitutional: Negative.   Respiratory: Negative.    Cardiovascular:  Negative for chest pain, palpitations and leg swelling.  Endocrine: Negative for polydipsia, polyphagia and polyuria.  Neurological:  Negative for dizziness.   Psychiatric/Behavioral: Negative.      Today's Vitals   11/02/20 1043  BP: 120/84  Pulse: 67  Temp: 98.1 F (36.7 C)  Weight: 123 lb 9.6 oz (56.1 kg)  Height: 5\' 5"  (1.651 m)  PainSc: 0-No pain   Body mass index is 20.57 kg/m.   Objective:  Physical Exam Constitutional:      General: She is not in acute distress.    Appearance: Normal appearance.  Cardiovascular:     Rate and Rhythm: Normal rate and regular rhythm.     Pulses: Normal pulses.     Heart sounds: Normal heart sounds. No murmur heard. Neurological:     General: No focal deficit present.  Mental Status: She is alert and oriented to person, place, and time.     Cranial Nerves: No cranial nerve deficit.     Motor: No weakness.  Psychiatric:     Comments: She is rambling with her speaking.         Assessment And Plan:     1. Hypercholesterolemia Chronic, continue with current medications  2. Mild cognitive impairment with memory loss Her memory seems to be worsening with hallucinations I will increase her seroquel I have explained to her son there are times when she is likely unaware of what she is doing and this is part of the process of dementia/alzheimer's - QUEtiapine (SEROQUEL) 50 MG tablet; TAKE 1 TABLET BY MOUTH EVERYDAY AT BEDTIME  Dispense: 90 tablet; Refill: 1 - memantine (NAMENDA) 5 MG tablet; Take 1 tablet (5 mg total) by mouth daily.  Dispense: 30 tablet; Refill: 2  3. Moderate protein-calorie malnutrition ([place]) Encouraged son to provide patient with protein shakes  4. Atherosclerotic heart disease of native coronary artery with other forms of angina pectoris ([place]) Chronic, stable   Patient was given opportunity to ask questions. Patient verbalized understanding of the plan and was able to repeat key elements of the plan. All questions were answered to their satisfaction.  [name], FNP   I, [name], FNP, have reviewed all documentation for this visit. The documentation on  11/02/20 for the exam, diagnosis, procedures, and orders are all accurate and complete.   IF YOU HAVE BEEN REFERRED TO A SPECIALIST, IT MAY TAKE 1-2 WEEKS TO SCHEDULE/PROCESS THE REFERRAL. IF YOU HAVE NOT HEARD FROM US/SPECIALIST IN TWO WEEKS, PLEASE GIVE [place] A CALL AT [phone].   THE PATIENT IS ENCOURAGED TO PRACTICE SOCIAL DISTANCING DUE TO THE COVID-19 PANDEMIC.

## 2020-11-02 NOTE — Patient Instructions (Signed)
Factors that may contribute to sleep disturbances and sundowning  Mental and physical exhaustion from a full day trying to keep up with an unfamiliar or confusing environment.  An upset in the "internal body clock," causing a biological mix-up between day and night.  Reduced lighting can increase shadows and may cause the person living with the disease to misinterpret what they see and, subsequently, become more agitated.  Nonverbal behaviors of others, especially if stress or frustration is present, may inadvertently contribute to the stress level of person living with Alzheimer's.  Disorientation due to the inability to separate dreams from reality when sleeping.  Less need for sleep, which is common among older adults.    Tips that may help manage sleep issues and sundowning  Get plenty of rest so you're less likely to exhibit unintended nonverbal behavior.  Share your tips Join [employer], our online support community and message boards, and share what strategies have worked for you and get more ideas from other caregivers.  Schedule activities such as doctor appointments, trips and bathing in the morning or early afternoon hours when the person living with dementia is more alert.  As much as possible, encourage a regular routine of waking up, meals and going to bed.   When possible and appropriate, include walks or time outside in the sunlight.  Make notes about what happens before sundowning events and try to identify triggers.  Reduce stimulation during the evening hours (i.e., TV, doing chores, loud music, etc.). These distractions may add to the person's confusion.  Offer a larger meal at lunch and keep the evening meal lighter.  Keep the home well lit in the evening. Adequate lighting may reduce the person's confusion.  Do not physically restrain the person; it can make agitation worse.  Try to identify activities that are soothing to the person, such as listening  to calming music, looking at photographs or watching a favorite movie.  Take a walk with the person to help reduce his or her restlessness.  Talk to the physician about the best times of day for taking medication.  If the person has trouble sleeping at night, it can be helpful to limit daytime naps.  Reduce or avoid alcohol, caffeine and nicotine, which can all affect ability to sleep.  When behavioral interventions and environmental changes do not work, discuss the situation with your doctor.

## 2020-11-02 NOTE — Telephone Encounter (Signed)
Scheduled per los. Called and left msg. Mailed printout  °

## 2020-11-08 ENCOUNTER — Other Ambulatory Visit: Payer: Medicare Other | Admitting: Hospice

## 2020-11-08 ENCOUNTER — Other Ambulatory Visit: Payer: Self-pay

## 2020-11-10 ENCOUNTER — Telehealth: Payer: Self-pay

## 2020-11-10 ENCOUNTER — Telehealth: Payer: Medicare Other

## 2020-11-10 NOTE — Telephone Encounter (Signed)
  Care Management   Follow Up Note   11/10/2020 Name: Sara Fox MRN: [id number] DOB: [birth year]   Referred by: [name], FNP Reason for referral : Chronic Care Management (RN CM Follow up call - 1st attempt )   An unsuccessful telephone outreach was attempted today. The patient was referred to the case management team for assistance with care management and care coordination.   Follow Up Plan: A HIPPA compliant phone message was left for the patient providing contact information and requesting a return call.   Barb Merino, RN, BSN, CCM Care Management Coordinator [place] [facility]  Direct Phone: [phone]

## 2020-11-11 ENCOUNTER — Telehealth: Payer: Medicare Other

## 2020-11-11 ENCOUNTER — Ambulatory Visit: Payer: Self-pay

## 2020-11-11 DIAGNOSIS — IMO0001 Reserved for inherently not codable concepts without codable children: Secondary | ICD-10-CM

## 2020-11-11 DIAGNOSIS — I5022 Chronic systolic (congestive) heart failure: Secondary | ICD-10-CM

## 2020-11-11 DIAGNOSIS — E7800 Pure hypercholesterolemia, unspecified: Secondary | ICD-10-CM

## 2020-11-11 DIAGNOSIS — I10 Essential (primary) hypertension: Secondary | ICD-10-CM

## 2020-11-11 DIAGNOSIS — I25118 Atherosclerotic heart disease of native coronary artery with other forms of angina pectoris: Secondary | ICD-10-CM

## 2020-11-11 DIAGNOSIS — G3184 Mild cognitive impairment, so stated: Secondary | ICD-10-CM

## 2020-11-11 DIAGNOSIS — N1831 Chronic kidney disease, stage 3a: Secondary | ICD-10-CM

## 2020-11-11 DIAGNOSIS — R911 Solitary pulmonary nodule: Secondary | ICD-10-CM

## 2020-11-12 ENCOUNTER — Ambulatory Visit (INDEPENDENT_AMBULATORY_CARE_PROVIDER_SITE_OTHER): Payer: Medicare Other

## 2020-11-12 ENCOUNTER — Other Ambulatory Visit: Payer: Self-pay

## 2020-11-12 DIAGNOSIS — G3184 Mild cognitive impairment, so stated: Secondary | ICD-10-CM

## 2020-11-12 DIAGNOSIS — R32 Unspecified urinary incontinence: Secondary | ICD-10-CM

## 2020-11-12 DIAGNOSIS — I10 Essential (primary) hypertension: Secondary | ICD-10-CM

## 2020-11-12 DIAGNOSIS — I5022 Chronic systolic (congestive) heart failure: Secondary | ICD-10-CM

## 2020-11-12 DIAGNOSIS — N1831 Chronic kidney disease, stage 3a: Secondary | ICD-10-CM

## 2020-11-12 MED ORDER — OXYBUTYNIN CHLORIDE 5 MG PO TABS: 2.5000 mg | ORAL_TABLET | Freq: Two times a day (BID) | ORAL | 0 refills | Status: AC
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
## 2020-11-12 NOTE — Patient Instructions (Signed)
Social Worker Visit Information  Goals we discussed today:   Goals Addressed             This Visit's Progress    Disease Progression Managed       Timeframe:  Short-Term Goal Priority:  High Start Date:  7.1.22                           Expected End Date: 8.30.22                       Next planned outreach: 7.5.22  Patient Goals/Self-Care Activities patient will: with the help of her son  - Visit the ED for evaluation of agitation -Work with SW for long term care placement          Materials Provided: Verbal education about placement process provided by phone  Follow Up Plan:  SW will follow up on 7.5.22   [name], BSW, CDP Social Worker, Certified Dementia Practitioner [place] / [place] [facility] [phone]

## 2020-11-16 ENCOUNTER — Ambulatory Visit: Payer: Medicare Other

## 2020-11-16 DIAGNOSIS — G3184 Mild cognitive impairment, so stated: Secondary | ICD-10-CM

## 2020-11-16 DIAGNOSIS — N1831 Chronic kidney disease, stage 3a: Secondary | ICD-10-CM

## 2020-11-16 DIAGNOSIS — I5022 Chronic systolic (congestive) heart failure: Secondary | ICD-10-CM

## 2020-11-16 DIAGNOSIS — I10 Essential (primary) hypertension: Secondary | ICD-10-CM

## 2020-11-16 NOTE — Patient Instructions (Signed)
Social Worker Visit Information  Goals we discussed today:   Goals Addressed             This Visit's Progress    Disease Progression Managed       Timeframe:  Short-Term Goal Priority:  High Start Date:  7.1.22                           Expected End Date: 8.30.22                       Patient Goals/Self-Care Activities patient will: with the help of her son  - Attend scheduled office visit on 7.7.22 -Work with SW for long term care placement          Materials Provided: Verbal education about placement process provided by phone  Follow Up Plan:  SW will continue to follow   [name], BSW, CDP Social Worker, Certified Dementia Practitioner [place] / [place] [facility] [phone]

## 2020-11-16 NOTE — Chronic Care Management (AMB) (Signed)
Chronic Care Management    Social Work Note  11/16/2020 Name: Sara Fox MRN: [id number] DOB: 1940-07-24  Sara Fox is a 80 y.o. year old female who is a primary care patient of [name], [place]. The CCM team was consulted to assist the patient with chronic disease management and/or care coordination needs related to: Level of Care Concerns.   Engaged with patient son by phone  for follow up visit in response to provider referral for social work chronic care management and care coordination services.   Consent to Services:  The patient was given information about Chronic Care Management services, agreed to services, and gave verbal consent prior to initiation of services.  Please see initial visit note for detailed documentation.   Patient agreed to services and consent obtained.   Assessment: Review of patient past medical history, allergies, medications, and health status, including review of relevant consultants reports was performed today as part of a comprehensive evaluation and provision of chronic care management and care coordination services.     SDOH (Social Determinants of Health) assessments and interventions performed:    Advanced Directives Status: Not addressed in this encounter.  CCM Care Plan  Allergies  Allergen Reactions   Buprenorphine Hcl Shortness Of Breath    Throat swelling/trouble breathing and lethargic   Morphine And Related Shortness Of Breath    Throat swelling/trouble breathing and lethargic   Celebrex [Celecoxib] Diarrhea and Swelling    Swelling of legs    Vioxx [Rofecoxib] Palpitations   Codeine Other (See Comments)    Bloated     Outpatient Encounter Medications as of 11/16/2020  Medication Sig   aspirin EC 81 MG tablet Take 81 mg by mouth daily. (Patient not taking: Reported on 11/02/2020)   Calcium Citrate-Vitamin D (CALCIUM CITRATE+D3 PETITES PO) Take 1 tablet by mouth daily.   cholecalciferol (VITAMIN D) 25 MCG (1000 UNIT) tablet  Take 1,000 Units by mouth daily. (Patient not taking: Reported on 11/02/2020)   ciclopirox (PENLAC) 8 % solution Apply 1 application topically at bedtime. Apply over nail and surrounding skin. Apply daily over previous coat. After seven (7) days, file nail and continue cycle. (Patient not taking: Reported on 11/02/2020)   CVS MAGNESIUM OXIDE 250 MG TABS TAKE 1 TABLET BY MOUTH WITH EVENING MEAL DAILY   donepezil (ARICEPT) 10 MG tablet Take 1 tablet (10 mg total) by mouth at bedtime.   ferrous sulfate 325 (65 FE) MG tablet Take 325 mg by mouth daily with breakfast. (Patient not taking: Reported on 11/02/2020)   furosemide (LASIX) 20 MG tablet TAKE 1 TABLET BY MOUTH EVERY OTHER DAY   gabapentin (NEURONTIN) 100 MG capsule Take 1 capsule (100 mg total) by mouth 2 (two) times daily.   Magnesium 250 MG TABS Take 250 mg by mouth every evening.   memantine (NAMENDA) 5 MG tablet Take 1 tablet (5 mg total) by mouth daily.   metoprolol tartrate (LOPRESSOR) 25 MG tablet TAKE 1 TABLET BY MOUTH TWICE A DAY   Multiple Vitamin (MULTIVITAMIN WITH MINERALS) TABS tablet Take 1 tablet by mouth daily. (Patient not taking: Reported on 11/02/2020)   nitroGLYCERIN (NITROSTAT) 0.4 MG SL tablet PLACE ONE TABLET UNDER THE TONGUE EVERY 5 MINUTES AS NEEDED FOR CHEST PAIN.   oxybutynin (DITROPAN) 5 MG tablet Take 0.5 tablets (2.5 mg total) by mouth 2 (two) times daily.   pantoprazole (PROTONIX) 40 MG tablet Take 1 tablet (40 mg total) by mouth 2 (two) times daily.   polyethylene glycol (MIRALAX /  GLYCOLAX) packet Take 17 g by mouth daily as needed for mild constipation. (Patient not taking: Reported on 11/02/2020)   Potassium Chloride ER 20 MEQ TBCR TAKE 2 TABLETS BY MOUTH EVERY MORNING (Patient taking differently: Take 40 mEq by mouth daily.)   QUEtiapine (SEROQUEL) 50 MG tablet TAKE 1 TABLET BY MOUTH EVERYDAY AT BEDTIME   rosuvastatin (CRESTOR) 40 MG tablet TAKE 1 TABLET BY MOUTH ONCE DAILY   SYMBICORT 160-4.5 MCG/ACT inhaler TAKE  2 PUFFS BY MOUTH TWICE A DAY   traMADol (ULTRAM) 50 MG tablet TAKE 1 TABLET 3 TIMES A DAY AND CAN TAKE 1 EXTRA 20 DAYS/MONTH   No facility-administered encounter medications on file as of 11/16/2020.    Patient Active Problem List   Diagnosis Date Noted   Non-small cell carcinoma of right lung, stage 1 ([place]) 06/08/2020   Pulmonary nodule 1 cm or greater in diameter 05/21/2020   Atherosclerotic heart disease of native coronary artery with other forms of angina pectoris ([place]) 04/22/2020   Fatigue 02/18/2019   Memory loss 02/18/2019   Vitamin B12 deficiency 10/23/2018   Hypotension 10/23/2018   Stage 3 chronic kidney disease ([place]) 06/03/2018   Chronic anemia 06/03/2018   Hypercholesterolemia 06/03/2018   Malnutrition of moderate degree 07/12/2017   Hypoalbuminemia 07/11/2017   Elevated LFTs 07/11/2017   Closed fracture of left distal radius 05/28/2017   Snoring 12/21/2016   OSA and COPD overlap syndrome ([place]) 12/21/2016   Peripheral musculoskeletal gait disorder 02/05/2015   Lumbar post-laminectomy syndrome 02/05/2015   CAD in native artery 10/16/2014   NSTEMI (non-ST elevated myocardial infarction) ([place]) 09/13/2014   Cardiomyopathy, ischemic 40/98/1191   Chronic systolic heart failure ([place])    Tobacco abuse 09/12/2014   Dyslipidemia 09/12/2014   CKD (chronic kidney disease), stage II 09/12/2014   Normocytic anemia 09/12/2014   Chronic diastolic heart failure, NYHA class 1 ([place]) 09/12/2014   Postlaminectomy syndrome, cervical region 08/01/2013   Chronic midline low back pain with left-sided sciatica 08/01/2013   Shoulder joint contracture 08/01/2013   HTN (hypertension) 03/03/2013   Abnormal genetic test 03/03/2013    Conditions to be addressed/monitored: CHF, HTN, CKD Stage III, and Mild Cognitive Impairment ; Level of care concerns  Care Plan : Social Work [facility] Care Plan  Updates made by [name] since 11/16/2020 12:00 AM     Problem: Disease Progression      Goal:  Disease Progression Managed   Start Date: 11/12/2020  Expected End Date: 01/11/2021  Priority: High  Note:   Current Barriers:  Chronic disease management support and education needs related to CHF, HTN, CKD Stage III, and Mild Cognitive Impairment   Increased Agitation  Social Worker Clinical Goal(s):  patient will work with SW to identify and address any acute and/or chronic care coordination needs related to the self health management of CHF, HTN, CKD Stage III, and Mild Cognitive Impairment   Patient and her son will work with SW to identify long term care options Patient, with the help of her son, will visit the ED to evaluate cause of increased agitation and combativeness Goal not met  SW Interventions:  Inter-disciplinary care team collaboration (see longitudinal plan of care) Collaboration with [name], [place] regarding development and update of comprehensive plan of care as evidenced by provider attestation and co-signature Successful outbound call placed to [name] to assess goal progression Determined [name] did not take patient to the ED because she refused to go Discussed the patient continues to be agitated and  difficult to manage in the home - patient went outside at 3:30 am and refused to come inside. [name] states he left her outside for a while because she would not listen to him. He told the patient if she left the porch he would call 911. The patient returned inside and swept the kitchen before going back to bed after 4:30 am Advised [name] patient may need to be seen in clinic to assess for causes of increased agitation Determined [name] is unable to bring her in until Thursday due to work schedule Scheduled patient to see Bary Castilla, NP on Thursday 11/18/20 Assessed for alternative caregiver while [name] is at work - [name] indicates his neighbor plans to assist with watching the patient to ensure safety Discussed plan for SW to initiate an  FL2 for patients provider to complete while also looking for placement options Collaboration with [name] FNP to advise of increased agitation and scheduled OV Determined Mrs. Laurance Flatten feels the patient is appropriate for assisted living memory care - discussed barrier in completing FL2 until after neurology visit due to the patient not having a dementia diagnosis Outbound call placed to [facility] who does have a female Medicaid bed available in memory care   Patient Goals/Self-Care Activities patient will: with the help of her son  -  Attend scheduled office visit on 7.7.22 -Work with SW for long term care placement  Follow Up Plan:  SW will fax completed FL2 to [place] with [facility] at [phone]       Follow Up Plan:  SW will continue to follow      [name], BSW, CDP Social Worker, Certified Dementia Practitioner [place] / [place] [facility] [phone]

## 2020-11-17 ENCOUNTER — Telehealth: Payer: Medicare Other

## 2020-11-17 ENCOUNTER — Telehealth: Payer: Self-pay

## 2020-11-17 NOTE — Telephone Encounter (Signed)
  Care Management   Follow Up Note   11/17/2020 Name: Sara Fox MRN: [id number] DOB: 1941-01-12   Referred by: [name], FNP Reason for referral : Chronic Care Management (Unsuccessful call)   An unsuccessful telephone outreach was attempted today. The patient was referred to the case management team for assistance with care management and care coordination. SW left a voice message advising Sara Fox he would need to re-submit an application for long-term care Medicaid. Requested a return call if he had questions.   Follow Up Plan:  SW will continue to follow to assist with placement needs.  [name], BSW, CDP Social Worker, Certified Dementia Practitioner [place] [facility] [place] [facility] [phone]

## 2020-11-18 ENCOUNTER — Ambulatory Visit: Payer: Medicare Other | Admitting: Neurology

## 2020-11-18 ENCOUNTER — Other Ambulatory Visit: Payer: Self-pay

## 2020-11-18 ENCOUNTER — Ambulatory Visit: Payer: Medicare Other | Admitting: Nurse Practitioner

## 2020-11-18 ENCOUNTER — Encounter: Payer: Self-pay | Admitting: Neurology

## 2020-11-18 VITALS — BP 102/61 | HR 75 | Ht 65.0 in | Wt 126.8 lb

## 2020-11-18 DIAGNOSIS — R441 Visual hallucinations: Secondary | ICD-10-CM | POA: Diagnosis not present

## 2020-11-18 DIAGNOSIS — F0281 Dementia in other diseases classified elsewhere with behavioral disturbance: Secondary | ICD-10-CM

## 2020-11-18 DIAGNOSIS — F05 Delirium due to known physiological condition: Secondary | ICD-10-CM | POA: Diagnosis not present

## 2020-11-18 DIAGNOSIS — F02818 Dementia in other diseases classified elsewhere, unspecified severity, with other behavioral disturbance: Secondary | ICD-10-CM

## 2020-11-18 DIAGNOSIS — G301 Alzheimer's disease with late onset: Secondary | ICD-10-CM

## 2020-11-18 NOTE — Progress Notes (Addendum)
Provider:  Larey Seat, M D  Primary Care Physician:  [name], FNP   Referring Provider: [name], FNP   Chief Complaint  Patient presents with   New Patient (Initial Visit)    RM 39 w/ son, Sara Fox. Internal referral for memory loss. Uses cane sometimes while walking. Has fallen a couple times in the last year. MOCA: 08/30.     HPI:  Sara Fox is a 80 y.o. female , seen here as in a referral  from Dr. Laurance Flatten and [name], NP The last time I have seen this patient  was in 2018, and today's visit is therefor a new patient visit, this time for  dementia evaluation:  When I first encountered Sara Fox in 2018 and was in for a sleep disorder which was partially due to sleep hygiene and partially related to her active smoking for decades.  In the meantime she seems to have had declining memory function and her son is looking for help with placement.  Her son Sara Fox accompanies her today.  She has fallen a couple of times in the last year....  Moca was obtained today which is invalid as she only scored 8 out of 30 points, no MMSE was done yet.  She doesn't recognize her home as hers. She does recognize Sara Fox. She reports seeing or having just spoken with  her long deceased parents. She lost a daughter in 09-01-2020, and she has not registered this fact. She wants to call her, visit her, wonders why she is not coming over.  Son and mother live in the same home.    Montreal Cognitive Assessment  11/18/2020  Visuospatial/ Executive (0/5) 1  Naming (0/3) 2  Attention: Read list of digits (0/2) 1  Attention: Read list of letters (0/1) 0  Attention: Serial 7 subtraction starting at 100 (0/3) 0  Language: Repeat phrase (0/2) 1  Language : Fluency (0/1) 0  Abstraction (0/2) 1  Delayed Recall (0/5) 0  Orientation (0/6) 1  Total 7  Adjusted Score (based on education) 8           2018 :  Chief complaint according to patient : " I snore"  I had the pleasure of  meeting Sara Fox today on 12/21/2016 as a new patient to my practice. She is a 80 year old right-handed 27 female ( black foot indian ) with a history of tobacco dependence, dyspnea, beginning COPD, osteopenia, hypercholesterolemia, and hypertension. She has endorsed a very have heavy use of tobacco for the last 55 years, Dr. Baird Cancer also was concerned about her declining memory, and her complaint of chronically being fatigued.   I also reviewed the patient's medication she sees Dr. Read Drivers for pain management. 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 Is the home is quiet she can fall asleep easily and she will stay asleep for about 2 hours, but she wakes up spontaneously. Her husband fixes his own breakfast and lunch in the morning but she does hear the sounds and wakes up. The couple lives with several dogs, she feels that most of her arousals from  sleep and spontaneous. She will rises finally at about 10:30 AM. Off the 6 hours and bed she may get sleep for 4 hours or less. She would be chronically sleep deprived. She is not as physically active in the afternoons, she watches TV and she often falls asleep in front. She will sit at a desk and falls asleep, she usually buys lunch and dinner and takes this home.  Sleep medical history and family sleep history:  " I never had great sleep " -  I slept last well 40 years ago.  Family history of  "poor circulation and heart disease' not diabetes.   Social history: married, 2 biological, 12 grandchildren( merged family) , heavy tobacco user. Alcohol- none (  used to 45 years ago) , caffeine: "I drink coffee 24/7! "     Review of Systems: Out of a complete 14 system review, the patient complains of only the following symptoms, and all other reviewed systems are negative. Mrs. Start endorsed weight loss, swelling in her legs, development of molds, coughing, wheezing, shortness of breath, headaches, memory loss change in appetite, runny nose and overall not getting enough sleep.   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 .    Social History   Socioeconomic History   Marital status: Married    Spouse name: Not on file   Number of children: 2   Years of education: GED   Highest education level: Not on file  Occupational History    Employer: RETIRED  Tobacco Use   Smoking status: Every Day    Packs/day: 0.50    Years: 60.00    Pack years: 30.00    Types: Cigarettes   Smokeless tobacco: Never   Tobacco comments:    currently smokes 1/2 ppd or less  Vaping Use   Vaping Use: Former  Substance and Sexual Activity   Alcohol use: No   Drug use: No   Sexual activity: Not Currently  Other Topics Concern   Not on file  Social History Narrative   Coffee/soda daily    Lives w/ son, Sara Fox   Right handed   Social Determinants of Health   Financial Resource Strain: Low Risk    Difficulty of Paying Living Expenses: Not hard at all  Food Insecurity: No Food Insecurity   Worried About Charity fundraiser in the Last Year: Never true   [place] in the Last Year: Never true  Transportation Needs: No Transportation Needs   Lack of Transportation (Medical): No   Lack of Transportation (Non-Medical): No  Physical Activity: Inactive   Days of Exercise per Week: 0 days   Minutes of Exercise per Session: 0 min  Stress: No Stress Concern Present   Feeling of Stress : Not at all  Social Connections: Not on file  Intimate Partner Violence: Not on file    Family History  Problem Relation Age of Onset    Heart disease Mother        also HTN   Diabetes Mother    Colon cancer Mother    Heart disease Brother        deceased at 46   Hypertension Brother    Heart attack Brother    Heart disease Brother    Hypertension Brother     Past Medical History:  Diagnosis Date   Acute kidney injury ([place])    Back pain    Cervical cancer (HCC)    CHF (congestive heart  failure) (HCC)    COPD (chronic obstructive pulmonary disease) (HCC)    Coronary artery disease    s/p DES x2 to RCA 2016   GERD (gastroesophageal reflux disease)    History of radiation therapy 07/27/20-08/16/20   Right lung- SBRT- Dr. Gery Pray    Hyperlipidemia    Hypertension    Mild cognitive impairment    Myocardial infarction [facility])    Neck pain    Rhabdomyolysis    Stomach problems     Past Surgical History:  Procedure Laterality Date   ABDOMINAL HYSTERECTOMY     BACK SURGERY  2012   lower back   BRONCHIAL BIOPSY  05/25/2020   Procedure: BRONCHIAL BIOPSIES;  Surgeon: Collene Gobble, MD;  Location: [facility] ENDOSCOPY;  Service: Pulmonary;;   BRONCHIAL BRUSHINGS  05/25/2020   Procedure: BRONCHIAL BRUSHINGS;  Surgeon: Collene Gobble, MD;  Location: [place];  Service: Pulmonary;;   BRONCHIAL NEEDLE ASPIRATION BIOPSY  05/25/2020   Procedure: BRONCHIAL NEEDLE ASPIRATION BIOPSIES;  Surgeon: Collene Gobble, MD;  Location: [place] ENDOSCOPY;  Service: Pulmonary;;   CARDIAC CATHETERIZATION  06/1999   noncritical disease invovling PDA   CARDIAC CATHETERIZATION N/A 09/14/2014   Procedure: Left Heart Cath and Coronary Angiography;  Surgeon: Leonie Man, MD;  Location: [place] INVASIVE CV LAB CUPID;  Service: Cardiovascular;  Laterality: N/A;   CHOLECYSTECTOMY     ESOPHAGOGASTRODUODENOSCOPY (EGD) WITH PROPOFOL Left 07/19/2017   Procedure: ESOPHAGOGASTRODUODENOSCOPY (EGD) WITH PROPOFOL;  Surgeon: Ronnette Juniper, MD;  Location: [facility] ENDOSCOPY;  Service: Gastroenterology;  Laterality: Left;   FINE NEEDLE ASPIRATION  05/25/2020   Procedure: FINE  NEEDLE ASPIRATION (FNA) LINEAR;  Surgeon: Collene Gobble, MD;  Location: [facility] ENDOSCOPY;  Service: Pulmonary;;   FRACTURE SURGERY Left 05/2017   left wrist   HARDWARE REMOVAL Left 11/07/2017   Procedure: LEFT WRIST HARDWARE REMOVAL;  Surgeon: Charlotte Crumb, MD;  Location: [place];  Service: Orthopedics;  Laterality: Left;   KNEE SURGERY Bilateral 2001 & 2007   NECK SURGERY  2012   2012   PARTIAL GASTRECTOMY  2005   subtotal   PERCUTANEOUS CORONARY STENT INTERVENTION (PCI-S)  09/14/2014   Procedure: Percutaneous Coronary Stent Intervention (Pci-S);  Surgeon: Leonie Man, MD;  Location: [facility] INVASIVE CV LAB CUPID;  Service: Cardiovascular;;   SHOULDER SURGERY Right    TRANSTHORACIC ECHOCARDIOGRAM  06/02/2010   EF=>55%, normal LV systolic function; normal RV systolic function; mild mitral annular calcif; trace TR; AV mildly sclerotic   VIDEO BRONCHOSCOPY WITH ENDOBRONCHIAL NAVIGATION N/A 05/25/2020   Procedure: VIDEO BRONCHOSCOPY WITH ENDOBRONCHIAL NAVIGATION;  Surgeon: Collene Gobble, MD;  Location: [place] ENDOSCOPY;  Service: Pulmonary;  Laterality: N/A;   VIDEO BRONCHOSCOPY WITH ENDOBRONCHIAL ULTRASOUND N/A 05/25/2020   Procedure: VIDEO BRONCHOSCOPY WITH ENDOBRONCHIAL ULTRASOUND;  Surgeon: Collene Gobble, MD;  Location: [place] ENDOSCOPY;  Service: Pulmonary;  Laterality: N/A;   WRIST OSTEOTOMY Left 11/07/2017   Procedure: LEFT WRIST DISTAL ULNA RESECTION WITH EXTENSOR CARPI ULNARIS STABILIZATION;  Surgeon: Charlotte Crumb, MD;  Location: [place];  Service: Orthopedics;  Laterality: Left;    Current Outpatient Medications  Medication Sig Dispense Refill   aspirin EC 81 MG tablet Take 81 mg by mouth daily.     Calcium Citrate-Vitamin D (CALCIUM CITRATE+D3 PETITES PO) Take 1 tablet by mouth daily.     cholecalciferol (VITAMIN D) 25 MCG (1000 UNIT) tablet Take 1,000 Units by mouth daily.     ciclopirox (PENLAC) 8 % solution Apply 1 application topically at bedtime. Apply over  nail and surrounding skin.  Apply daily over previous coat. After seven (7) days, file nail and continue cycle.     [employer] MAGNESIUM OXIDE 250 MG TABS TAKE 1 TABLET BY MOUTH WITH EVENING MEAL DAILY 90 tablet 1   donepezil (ARICEPT) 10 MG tablet Take 1 tablet (10 mg total) by mouth at bedtime. 90 tablet 1   ferrous sulfate 325 (65 FE) MG tablet Take 325 mg by mouth daily with breakfast.     furosemide (LASIX) 20 MG tablet TAKE 1 TABLET BY MOUTH EVERY OTHER DAY 45 tablet 1   gabapentin (NEURONTIN) 100 MG capsule Take 1 capsule (100 mg total) by mouth 2 (two) times daily. 180 capsule 1   Magnesium 250 MG TABS Take 250 mg by mouth every evening.     memantine (NAMENDA) 5 MG tablet Take 1 tablet (5 mg total) by mouth daily. 30 tablet 2   metoprolol tartrate (LOPRESSOR) 25 MG tablet TAKE 1 TABLET BY MOUTH TWICE A DAY 180 tablet 1   Multiple Vitamin (MULTIVITAMIN WITH MINERALS) TABS tablet Take 1 tablet by mouth daily.     nitroGLYCERIN (NITROSTAT) 0.4 MG SL tablet PLACE ONE TABLET UNDER THE TONGUE EVERY 5 MINUTES AS NEEDED FOR CHEST PAIN. 25 tablet 1   oxybutynin (DITROPAN) 5 MG tablet Take 0.5 tablets (2.5 mg total) by mouth 2 (two) times daily. 180 tablet 0   pantoprazole (PROTONIX) 40 MG tablet Take 1 tablet (40 mg total) by mouth 2 (two) times daily. 180 tablet 1   polyethylene glycol (MIRALAX / GLYCOLAX) packet Take 17 g by mouth daily as needed for mild constipation.     Potassium Chloride ER 20 MEQ TBCR TAKE 2 TABLETS BY MOUTH EVERY MORNING (Patient taking differently: Take 40 mEq by mouth daily.) 180 tablet 3   QUEtiapine (SEROQUEL) 50 MG tablet TAKE 1 TABLET BY MOUTH EVERYDAY AT BEDTIME 90 tablet 1   rosuvastatin (CRESTOR) 40 MG tablet TAKE 1 TABLET BY MOUTH ONCE DAILY 90 tablet 3   SYMBICORT 160-4.5 MCG/ACT inhaler TAKE 2 PUFFS BY MOUTH TWICE A DAY 30.6 each 1   traMADol (ULTRAM) 50 MG tablet TAKE 1 TABLET 3 TIMES A DAY AND CAN TAKE 1 EXTRA 20 DAYS/MONTH 110 tablet 2   No current facility-administered medications for this  visit.    Allergies as of 11/18/2020 - Review Complete 11/18/2020  Allergen Reaction Noted   Buprenorphine hcl Shortness Of Breath 12/02/2015   Morphine and related Shortness Of Breath 10/17/2010   Celebrex [celecoxib] Diarrhea and Swelling 10/17/2010   Vioxx [rofecoxib] Palpitations 10/17/2010   Codeine Other (See Comments) 02/19/2013    Vitals: BP 102/61   Pulse 75   Ht 5\' 5"  (1.651 m)   Wt 126 lb 12.8 oz (57.5 kg)   BMI 21.10 kg/m  Last Weight:  Wt Readings from Last 1 Encounters:  11/18/20 126 lb 12.8 oz (57.5 kg)   UKG:URKY mass index is 21.1 kg/m.     Last Height:   Ht Readings from Last 1 Encounters:  11/18/20 5\' 5"  (1.651 m)    Physical exam:  General: The patient is awake, alert and appears not in acute distress. The patient is poorly groomed. Poor dentition,  Head: Normocephalic, atraumatic. Neck is supple. Mallampati 4,  neck circumference:14 . Nasal airflow congested,  Retrognathia is seen.  Cardiovascular:  Regular rate and rhythm , without  murmurs or carotid bruit, and without distended neck veins. Respiratory: underwent radiation for lung cancer.  Skin:  With high grade ankle  edema, or rash- Tongue is black spotted.  Trunk: BMI is 25. The patient's posture is stooped    Neurologic exam : The patient is awake and alert, oriented to place and time.   Memory subjective described as impaired.  Speech is non-fluent,  with dysphonia and aphasia.  Mood and affect are appropriate.  Cranial nerves: Pupils are equal and briskly reactive to light. Funduscopic exam deferred .  Extraocular movements  in vertical and horizontal planes intact and without nystagmus. Visual fields by finger perimetry are intact. Hearing to finger rub intact.  Facial sensation intact to fine touch. No teeth, constantly chewing. Facial motor strength is symmetric and tongue and uvula move midline.  Shoulder shrug was symmetrical.  Motor exam:    Abnormal tone, rigor over both biceps.  had loss of muscle bulk and symmetric strength in all extremities. Sensory:  Fine touch, pinprick and vibration were normal.  Coordination: Rapid alternating movements in the fingers/hands were slowed, she has resting tremor- evidence of ataxia, dysmetria or tremor.Gait and station: Patient walks with a cane , unsteadiness- device. Turns with 6 Steps.   Deep tendon reflexes: in the upper and lower extremities are symmetric and intact.     Assessment:  After physical and neurologic examination, review of laboratory studies,  Personal review of imaging studies, reports of other /same  Imaging studies, results of polysomnography and / or neurophysiology testing and pre-existing records as far as provided in visit., my assessment is:  This is the first time I see this patient and I truly saw her only once before over 4 years ago.  Her cognitive testing is concerning for a late onset but also progressed late stage memory loss apparently there have been some misperceptions for example not recognizing ones on home but there is also a complete disorientation to date months year she believes she is in the hospital which sometimes can happen when people visit a medical office she knew the day of the week but she believes it was the year 2012 not 2022.  The month was April instead of July there was not one word from an immediate recall recalled.  She did repeat a series of 5 fingers but she could not do the reverse.  She recognized 2 out of 3 animals that was the only category she was comfortable in.  Her clock draw did not include any hands of the clock to be placed and the numbers were strong without system, she was not copying a cube but wrote the number for under it and she did not performed with the Trail Making Test.  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  Her son now stated she has visual hallucinations, and she blames others for consequences of her own actions. No auditory hallucinations.   1) severe dementia degree- to all cognitive domaines- .  2)multifactorial gait disorder, mainly weakness and unsteadiness.  3)  COPD, wheezing- sleep choking and coughing. Has been losing weight, has been diagnosed with a lung tumor.   MRI Brain: 06-21-20  Mild cerebral atrophy, appropriate for age. Negative for hydrocephalus. Mild white matter changes with scattered small deep white matter hyperintensities bilaterally. Negative for  metastatic disease. No enhancing lesions. Negative for intracranial hemorrhage. No acute infarct identified.  Skull and upper cervical spine: No focal skeletal lesion. Degenerative change in the cervical spine with prior ACDF.   IMPRESSION: Negative for metastatic disease to the brain   Mild atrophy and mild chronic microvascular ischemic change in the white matter.     Electronically Signed   By: Franchot Gallo M.D.   On: 06/21/2020 08:10   The patient was advised of the nature of the diagnosed disorder , the treatment options and the  risks for general health and wellness arising from not treating the condition.  I spent more than 35 minutes of face to face time with the patient.Greater than 50% of time was spent in counseling and coordination of care. We have discussed the diagnosis and differential and I answered the patient's questions.    Plan:  Treatment plan and additional workup : The patient needs 24 hour care, she is cognitively too impaired to be left by herself.  Gait impairment is present , but the patient is not aware of her limitations and has fallen. She is unable to structure her day, to recognize meal times, but she eats unassisted. She is not taking  baths without pressure by family, doesn't want to change clothes.   Dressing without help, but is unwilling to.   Her son is interested in the pace program but less he was not less inclined so she has already been seen by palliative care in May of this year her heart condition congestive heart failure, her lung consult, her underlying kidney failure all these conditions to get the as comorbidities determine her life expectancy.  I think that she is a good candidate for memory daycare if she accepts such.  I am not sure how much care in the home can be given is usually only on an hourly basis and may not be sufficient since Mr. Stanfill has to work.  Placement may be the only option with returning home on weekends or similar.  I beieve this is more along the lines of alzheimer's dementia, but at a later stage. 3-4  Coniinue ARICEPT and [place], MD 05/15/209, 1:73 PM  Certified in Neurology by ABPN Certified in [place] by [facility] [address], [place], [place] [address]

## 2020-11-18 NOTE — Patient Instructions (Signed)
Confusion Confusion is the inability to think with your usual speed or clarity. Confusion can be caused by many things. People who are confused often describe their thinking as cloudy or unclear. Confusion can also include feeling disoriented. This means you are unaware of where you are or who you are. You may also not know the date or time. When confused, you may have trouble remembering, paying attention, or making decisions. Some people also act aggressively when they areconfused. In some cases, confusion may come on quickly. In other cases, it may developslowly over time. Confusion may be caused by medical conditions such as: Infections, such as a urinary tract infection (UTI). Low levels of oxygen, which can develop from conditions such as long-term lung disorders. Decrease in brain function due to dementia and other conditions that affect the brain, such as seizures, strokes, brain tumors, or head injuries. Mental health conditions, like panic attacks, anxiety, depression, and hallucinations. Confusion may also be caused by physical factors such as: Loss of fluid (dehydration) or an imbalance of salts and minerals in the body (electrolytes). Lack of certain nutrients like niacin, thiamine, or other B vitamins. Fever or hypothermia, which is a sudden drop in body temperature. Low or high blood sugar. Low or high blood pressure. Other causes include: Lack of sleep or changes in routine or surroundings, such as when traveling or staying in a hospital. Using too much alcohol, drugs, or medicine. Side effects of medicines, or taking medicines that affect other medicines (drug interactions). Follow these instructions at home: Pay attention to your symptoms. Tell your health care provider about any changes or if you develop new symptoms. Follow these instructions to control ortreat symptoms. Ask a family member or friend for help if needed. Medicines  Take over-the-counter and prescription  medicines only as told by your health care provider. Ask your health care provider about changing or stopping any medicines that may be causing your confusion. Avoid pain medicines or sleep medicines until you have fully recovered. Use a pillbox or an alarm to help you take the right medicines at the right time.  Lifestyle  Eat a balanced diet that includes fruits and vegetables. Get enough sleep. For most adults, this is 7-9 hours each night. Do not drink alcohol. Do not become isolated. Spend time with other people and make plans for your days. Do not drive until your health care provider says that it is safe to do so. Do not use any products that contain nicotine or tobacco, such as cigarettes, e-cigarettes, and chewing tobacco. If you need help quitting, ask your health care provider. Stop other activities that may increase your chances of getting hurt. These may include some work duties, sports activities, swimming, or bike riding. Ask your health care provider what activities are safe for you.  Tips for caregivers Find out if the person is confused. Ask the person to state his or her name, age, and the date. If the person is unsure or answers incorrectly, he or she may be confused and need assistance. Always introduce yourself, no matter how well the person knows you. Remind the person of his or her location. Place a calendar and clock near the person who is confused. Keep a regular schedule. Make sure the person has plenty of light during the day and sleep at night. Talk about current events and plans for the day. Keep the environment calm, quiet, and peaceful. Help the person do the things that he or she is unable to do.  These include: Taking medicines. Keeping medical appointments. Helping with household duties, including meal preparation. Running errands. Get help if you need it. There are several support groups for caregivers. If the person you are helping needs more support,  consider day care, extended-care programs, or a skilled nursing facility. The person's health care provider may be able to help evaluate these options. General instructions Monitor yourself for any conditions you may have. These can include: Checking your blood glucose levels if you have diabetes. Maintaining a healthy weight. Monitoring your blood pressure if you have hypertension. Monitoring your body temperature if you have a fever. Keep all follow-up visits. This is important. Contact a health care provider if: You have new symptoms or your symptoms get worse. Get help right away if you: Feel that you are not able to care for yourself. Develop severe headaches, repeated vomiting, seizures, blackouts, or slurred speech. Have increasing confusion, weakness, numbness, restlessness, or personality changes. Develop a loss of balance, have marked dizziness, feel uncoordinated, or fall. Develop severe anxiety, or you have delusions or hallucinations. These symptoms may represent a serious problem that is an emergency. Do not wait to see if the symptoms will go away. Get medical help right away. Call your local emergency services (911 in the [place] Do not drive yourself to the hospital. Summary Confusion is the inability to think with your usual speed or clarity. People who are confused often describe their thinking as cloudy or unclear. Confusion can also include having trouble remembering, paying attention, or making decisions. Confusion may come on quickly or develop slowly over time, depending on the cause. There are many different causes of confusion. Ask for help from family members or friends if you are unable to take care of yourself. This information is not intended to replace advice given to you by your health care provider. Make sure you discuss any questions you have with your healthcare provider. Document Revised: 08/26/2019 Document Reviewed: 08/26/2019 Elsevier Patient Education   2022 [place]. Alzheimer's Disease Caregiver Guide Alzheimer's disease is a condition that makes a person: Forget things. Act differently. Have trouble paying attention and doing simple tasks. These things get worse with time. The tips below can help you care for theperson. How to help manage lifestyle changes Tips to help with symptoms Be calm and patient. Give simple, short answers to questions. Avoid correcting the person in a negative way. Try not to take things personally, even if the person forgets your name. Do not argue with the person. This may make the person more upset. Tips to lessen frustration Make appointments and do daily tasks when the person is at his or her best. Take your time. Simple tasks may take longer. Allow plenty of time to complete tasks. Limit choices for the person. Involve the person in what you are doing. Keep things organized: Keep a daily routine. Organize medicines in a pillbox for each day of the week. Keep a calendar in a central location to remind the person of meetings or other activities. Avoid new or crowded places, if possible. Use simple words, short sentences, and a calm voice. Only give one direction at a time. Buy clothes and shoes that are easy to put on and take off. Try to change the subject if the person becomes frustrated or angry. Tips to prevent injury  Keep floors clear. Remove rugs, magazine racks, and floor lamps. Keep hallways well-lit. Put a handrail and non-slip mat in the bathtub or shower. Put childproof locks on  cabinets that have dangerous items in them. These items include medicine, alcohol, guns, toxic cleaning items, sharp tools, matches, and lighters. Put locks on doors where the person cannot see or reach them. This helps keep the person from going out of the house and getting lost. Be ready for emergencies. Keep a list of emergency phone numbers and addresses close by. Remove car keys and lock garage doors so  that the person does not try to drive. Bracelets may be worn that track location and identify the person as having memory problems. This should be worn at all times for safety.  Tips for the future  Discuss financial and legal planning early. People with this disease have trouble managing their money as the disease gets worse. Get help from a professional. Talk about advance directives, safety, and daily care. Take these steps: Create a living will and choose a power of attorney. This is someone who can make decisions for the person with Alzheimer's disease when he or she can no longer do so. Discuss driving safety and when to stop driving. The person's doctor can help with this. If the person lives alone, make sure he or she is safe. Some people need extra help at home. Other people need more care at a nursing home or care center.  How to recognize changes in the person's condition With this disease, memory problems and confusion slowly get worse. In time, theperson may not know his or her friends and family members. The disease can also cause changes in behavior and mood, such as anxiety or anger. The person may see, hear, taste, smell, or feel things that are not real (hallucinate). These changes can come on all of a sudden. They may happen in response to something such as: Pain. An infection. Changes in temperature or noise. Too much stimulation. Feeling lost or scared. Medicines. Where to find support Find out about services that can provide short-term care (respite care). These can allow you to take a break when you need it. Join a support group near you. These groups can help you: Learn ways to manage stress. Share experiences with others. Get emotional comfort and support. Learn about caregiving as the disease gets worse. Know what community resources are available. Where to find more information Alzheimer's Association: [employer] Contact a doctor if: The person has a  fever. The person has a sudden behavior change that does not get better with calming strategies. The person is not able to take care of himself or herself at home. You are no longer able to care for the person. Get help right away if: The person has a sudden increase in confusion or new hallucinations. The person threatens you or anyone else, including himself or herself. Get help right away if you feel like your loved one may hurt himself or herself or others, or has thoughts about taking his or her own life. Go to your nearest emergency room or: Call your local emergency services (911 in the [place] Call the [place] at 220-608-9610. This is open 24 hours a day. Text the Crisis Text Line at 512-576-3578. Summary Alzheimer's disease causes a person to forget things. A person who has this condition may have trouble doing simple tasks. Take steps to keep the person from getting hurt. Plan for future care. You can find support by joining a support group near you. This information is not intended to replace advice given to you by your health care provider. Make sure you discuss  any questions you have with your healthcare provider. Document Revised: 08/18/2019 Document Reviewed: 08/18/2019 Elsevier Patient Education  2022 [employer].

## 2020-11-19 ENCOUNTER — Telehealth: Payer: Self-pay

## 2020-11-19 ENCOUNTER — Telehealth: Payer: Medicare Other

## 2020-11-19 ENCOUNTER — Ambulatory Visit: Payer: Self-pay

## 2020-11-19 DIAGNOSIS — N1831 Chronic kidney disease, stage 3a: Secondary | ICD-10-CM

## 2020-11-19 DIAGNOSIS — G301 Alzheimer's disease with late onset: Secondary | ICD-10-CM | POA: Diagnosis not present

## 2020-11-19 DIAGNOSIS — G3184 Mild cognitive impairment, so stated: Secondary | ICD-10-CM

## 2020-11-19 DIAGNOSIS — I25118 Atherosclerotic heart disease of native coronary artery with other forms of angina pectoris: Secondary | ICD-10-CM | POA: Diagnosis not present

## 2020-11-19 DIAGNOSIS — I5022 Chronic systolic (congestive) heart failure: Secondary | ICD-10-CM | POA: Diagnosis not present

## 2020-11-19 DIAGNOSIS — I10 Essential (primary) hypertension: Secondary | ICD-10-CM

## 2020-11-19 DIAGNOSIS — R911 Solitary pulmonary nodule: Secondary | ICD-10-CM

## 2020-11-19 DIAGNOSIS — IMO0001 Reserved for inherently not codable concepts without codable children: Secondary | ICD-10-CM

## 2020-11-19 DIAGNOSIS — F0281 Dementia in other diseases classified elsewhere with behavioral disturbance: Secondary | ICD-10-CM

## 2020-11-19 NOTE — Patient Instructions (Addendum)
Visit Information It was great speaking with you today!  Please let me know if you have any questions about our visit.   Goals Addressed             This Visit's Progress    Manage My Medicine       Timeframe:  Long-Range Goal Priority:  High Start Date:                             Expected End Date:                       Follow Up Date 02/03/2021    - call for medicine refill 2 or 3 days before it runs out - call if I am sick and can't take my medicine - use a pillbox to sort medicine    Why is this important?   These steps will help you keep on track with your medicines.   Notes:  Please call if you have any questions          Patient Care Plan: CCM Pharmacy Care Plan     Problem Identified: HTN, HLD   Priority: High     Long-Range Goal: Disease Management   This Visit's Progress: On track  Note:     Current Barriers:  Unable to independently monitor therapeutic efficacy Unable to self administer medications as prescribed Does not adhere to prescribed medication regimen  Pharmacist Clinical Goal(s):  Patient will achieve adherence to monitoring guidelines and medication adherence to achieve therapeutic efficacy through collaboration with PharmD and provider.   Interventions: 1:1 collaboration with [name], FNP regarding development and update of comprehensive plan of care as evidenced by provider attestation and co-signature Inter-disciplinary care team collaboration (see longitudinal plan of care) Comprehensive medication review performed; medication list updated in electronic medical record  Hypertension (BP goal <140/90) -Controlled -Current treatment: Metoprolol Tartrate 25 mg tablet twice per day -Medications previously tried: none noted   -Current home readings: patients BP is not being checked at this time -Current dietary habits: patient is eating well trying to make sure she is healthy.  -Current exercise habits: not currently  exercising.  -Denies hypotensive/hypertensive symptoms -Educated on Importance of home blood pressure monitoring; Proper BP monitoring technique; -Counseled to monitor BP at home at least once per week, document, and provide log at future appointments -Recommended to continue current medication  Hyperlipidemia: (LDL goal < 70) -Uncontrolled -Current treatment: Rosuvastatin 40 mg tablet once per day  -Current dietary patterns: patient is not eating fried and fatty foods -Current exercise habits: Patient is not currently exercising.  -Educated on Benefits of statin for ASCVD risk reduction; Importance of limiting foods high in cholesterol; -Recommended to continue current medication  Patient Goals/Self-Care Activities Patient will:  - take medications as prescribed  Follow Up Plan: The patient has been provided with contact information for the care management team and has been advised to call with any health related questions or concerns.        Patient agreed to services and verbal consent obtained.   The patient verbalized understanding of instructions, educational materials, and care plan provided today and agreed to receive a mailed copy of patient instructions, educational materials, and care plan.   Orlando Penner, PharmD Clinical Pharmacist [facility] [phone]

## 2020-11-19 NOTE — Patient Instructions (Signed)
Goals Addressed      Follow My Treatment Plan-Chronic Kidney   On track    Timeframe:  Long-Range Goal Priority:  Medium Start Date: 07/26/20                           Expected End Date:  07/26/21                 Follow Up Date: 12/17/20   - call for medicine refill 2 or 3 days before it runs out - call the doctor or nurse before I stop taking medicine - call the doctor or nurse to get help with side effects - keep follow-up appointments - set alarm to remind of prescription refill date(s) - set alarm to remind to take medications    Why is this important?   Staying as healthy as you can is very important. This may mean making changes if you smoke, don't exercise or eat poorly.  A healthy lifestyle is an important goal for you.  Following the treatment plan and making changes may be hard.  Try some of these steps to help keep the disease from getting worse.     Notes:       Optimal Cognitive Function   On track    Timeframe:  Long-Range Goal Priority:  Medium Start Date: 07/26/20                             Expected End Date: 07/26/21                     Next Follow Up date: 12/17/20  Patient Goals/Self-Care Activities:   - Maintain Optimal Cognitive Function  - Follow up with PCP for evatuation of change in cognitive function and behavior       Understand treatment plan for new Cancer diagnosis   On track    Timeframe:  Long-Range Goal Priority:  High Start Date: 06/15/20                            Expected End Date:  06/15/21  Follow up date: 12/17/20  Complete scheduled MRI and PET scans as directed Keep all MD follow up appointments as scheduled Call your Oncology navigator for questions related to Cancer diagnosis and next steps Continue to work with PCP and CCM team for disease education and support and or for assistance with care coordination as needed

## 2020-11-19 NOTE — Telephone Encounter (Signed)
[name] reports he is frustrated because his mothers appointment was rescheduled. He reports that he thought the time of the appointment was 11:30 but on the scheduling was 11:00. I assured him that we care for his mother and our top priority is patient care. I apologized profusely and let him now that he call the after hours line here or take Ms. Totton to Urgent Care. He verbalized understanding and said that he is waiting a call back from the social worker.   Orlando Penner, PharmD Clinical Pharmacist [facility] [phone]

## 2020-11-19 NOTE — Telephone Encounter (Signed)
  Care Management   Follow Up Note   11/19/2020 Name: Sara Fox MRN: [id number] DOB: [birth year]   Referred by: [name], FNP Reason for referral : Chronic Care Management (Inbound call from patient/caregiver )   An unsuccessful telephone outreach was attempted today. The patient was referred to the case management team for assistance with care management and care coordination.   Follow Up Plan: A HIPPA compliant phone message was left for the patient providing contact information and requesting a return call.   Barb Merino, RN, BSN, CCM Care Management Coordinator [facility]  Direct Phone: [phone]

## 2020-11-19 NOTE — Chronic Care Management (AMB) (Signed)
Chronic Care Management   CCM RN Visit Note  11/11/2020 Name: Sara Fox MRN: [id number] DOB: [birth year]  Subjective: Sara Fox is a 80 y.o. year old female who is a primary care patient of [name], [place]. The care management team was consulted for assistance with disease management and care coordination needs.    Engaged with patient by telephone for follow up visit in response to provider referral for case management and/or care coordination services.   Consent to Services:  The patient was given information about Chronic Care Management services, agreed to services, and gave verbal consent prior to initiation of services.  Please see initial visit note for detailed documentation.   Patient agreed to services and verbal consent obtained.   Assessment: Review of patient past medical history, allergies, medications, health status, including review of consultants reports, laboratory and other test data, was performed as part of comprehensive evaluation and provision of chronic care management services.   SDOH (Social Determinants of Health) assessments and interventions performed:    CCM Care Plan  Allergies  Allergen Reactions   Buprenorphine Hcl Shortness Of Breath    Throat swelling/trouble breathing and lethargic   Morphine And Related Shortness Of Breath    Throat swelling/trouble breathing and lethargic   Celebrex [Celecoxib] Diarrhea and Swelling    Swelling of legs    Vioxx [Rofecoxib] Palpitations   Codeine Other (See Comments)    Bloated     Outpatient Encounter Medications as of 11/11/2020  Medication Sig   aspirin EC 81 MG tablet Take 81 mg by mouth daily.   Calcium Citrate-Vitamin D (CALCIUM CITRATE+D3 PETITES PO) Take 1 tablet by mouth daily.   cholecalciferol (VITAMIN D) 25 MCG (1000 UNIT) tablet Take 1,000 Units by mouth daily.   ciclopirox (PENLAC) 8 % solution Apply 1 application topically at bedtime. Apply over nail and surrounding skin. Apply  daily over previous coat. After seven (7) days, file nail and continue cycle.   [employer] MAGNESIUM OXIDE 250 MG TABS TAKE 1 TABLET BY MOUTH WITH EVENING MEAL DAILY   donepezil (ARICEPT) 10 MG tablet Take 1 tablet (10 mg total) by mouth at bedtime.   ferrous sulfate 325 (65 FE) MG tablet Take 325 mg by mouth daily with breakfast.   furosemide (LASIX) 20 MG tablet TAKE 1 TABLET BY MOUTH EVERY OTHER DAY   gabapentin (NEURONTIN) 100 MG capsule Take 1 capsule (100 mg total) by mouth 2 (two) times daily.   Magnesium 250 MG TABS Take 250 mg by mouth every evening.   memantine (NAMENDA) 5 MG tablet Take 1 tablet (5 mg total) by mouth daily.   metoprolol tartrate (LOPRESSOR) 25 MG tablet TAKE 1 TABLET BY MOUTH TWICE A DAY   Multiple Vitamin (MULTIVITAMIN WITH MINERALS) TABS tablet Take 1 tablet by mouth daily.   nitroGLYCERIN (NITROSTAT) 0.4 MG SL tablet PLACE ONE TABLET UNDER THE TONGUE EVERY 5 MINUTES AS NEEDED FOR CHEST PAIN.   pantoprazole (PROTONIX) 40 MG tablet Take 1 tablet (40 mg total) by mouth 2 (two) times daily.   polyethylene glycol (MIRALAX / GLYCOLAX) packet Take 17 g by mouth daily as needed for mild constipation.   Potassium Chloride ER 20 MEQ TBCR TAKE 2 TABLETS BY MOUTH EVERY MORNING (Patient taking differently: Take 40 mEq by mouth daily.)   QUEtiapine (SEROQUEL) 50 MG tablet TAKE 1 TABLET BY MOUTH EVERYDAY AT BEDTIME   rosuvastatin (CRESTOR) 40 MG tablet TAKE 1 TABLET BY MOUTH ONCE DAILY   SYMBICORT 160-4.5 MCG/ACT  inhaler TAKE 2 PUFFS BY MOUTH TWICE A DAY   traMADol (ULTRAM) 50 MG tablet TAKE 1 TABLET 3 TIMES A DAY AND CAN TAKE 1 EXTRA 20 DAYS/MONTH   [DISCONTINUED] oxybutynin (DITROPAN) 5 MG tablet TAKE 1 TABLET BY MOUTH TWICE A DAY (Patient taking differently: 2.5 mg 2 (two) times daily.)   No facility-administered encounter medications on file as of 11/11/2020.    Patient Active Problem List   Diagnosis Date Noted   Late onset Alzheimer's dementia with behavioral disturbance ([place])  11/18/2020   Visual hallucination 11/18/2020   Sundowning 11/18/2020   Non-small cell carcinoma of right lung, stage 1 ([place]) 06/08/2020   Pulmonary nodule 1 cm or greater in diameter 05/21/2020   Atherosclerotic heart disease of native coronary artery with other forms of angina pectoris ([place]) 04/22/2020   Fatigue 02/18/2019   Memory loss 02/18/2019   Vitamin B12 deficiency 10/23/2018   Hypotension 10/23/2018   Stage 3 chronic kidney disease ([place]) 06/03/2018   Chronic anemia 06/03/2018   Hypercholesterolemia 06/03/2018   Malnutrition of moderate degree 07/12/2017   Hypoalbuminemia 07/11/2017   Elevated LFTs 07/11/2017   Closed fracture of left distal radius 05/28/2017   Snoring 12/21/2016   OSA and COPD overlap syndrome ([place]) 12/21/2016   Peripheral musculoskeletal gait disorder 02/05/2015   Lumbar post-laminectomy syndrome 02/05/2015   CAD in native artery 10/16/2014   NSTEMI (non-ST elevated myocardial infarction) ([place]) 09/13/2014   Cardiomyopathy, ischemic 56/43/3295   Chronic systolic heart failure ([place])    Tobacco abuse 09/12/2014   Dyslipidemia 09/12/2014   CKD (chronic kidney disease), stage II 09/12/2014   Normocytic anemia 09/12/2014   Chronic diastolic heart failure, NYHA class 1 ([place]) 09/12/2014   Postlaminectomy syndrome, cervical region 08/01/2013   Chronic midline low back pain with left-sided sciatica 08/01/2013   Shoulder joint contracture 08/01/2013   HTN (hypertension) 03/03/2013   Abnormal genetic test 03/03/2013    Conditions to be addressed/monitored: Stage 3a chronic kidney disease, HTN, chronic systolic heart failure, Mild Cognitive Impairment, Atherosclerotic heart disease of native coronary artery with other forms of angina pectoris, Lung Nodule    Care Plan : Cancer Treatment Phase (Adult)  Updates made by Lynne Logan, RN since 11/11/2020 12:00 AM     Problem: Psychosocial Response to Cancer Diagnosis   Priority: High     Long-Range Goal:  Optimal Coping   Start Date: 06/15/2020  Expected End Date: 06/15/2021  Recent Progress: On track  Priority: High  Note:   Current Barriers:  Ineffective Self Health Maintenance Lacks social connections Cognitive deficits  Currently UNABLE TO independently self manage needs related to chronic health conditions.  Knowledge Deficits related to short term plan for care coordination needs and long term plans for chronic disease management needs Clinical Goal(s):  Collaboration with [name], FNP regarding development and update of comprehensive plan of care as evidenced by provider attestation and co-signature Inter-disciplinary care team collaboration (see longitudinal plan of care) Patient will work with care management team to address care coordination and chronic disease management needs related to Disease Management Educational Needs Care Coordination Medication Management and Education Medication Reconciliation Psychosocial Support Dementia and Caregiver Support   Interventions:  11/11/20 completed successful outbound call with son [name]  Evaluation of current treatment plan related to Dementia and patient's adherence to plan as established by provider. Collaboration with [name], FNP regarding development and update of comprehensive plan of care as evidenced by provider attestation       and co-signature Inter-disciplinary  care team collaboration (see longitudinal plan of care) Determined patient completed her Radiation treatment as directed by Oncologist, Dr. Mckinley Jewel Reviewed and discussed outcome of follow up with Dr. Mckinley Jewel completed on 11/01/20 ASSESSMENT AND PLAN: This is a very pleasant 80 years old African-American female with a stage IIa (T2b, N0, M0) non-small cell lung cancer with unspecified histologic subtype diagnosed in January 2022. The patient had a PET scan as well as MRI of the brain performed recently.  The PET scan showed low-level FDG uptake associated  with the ill-defined subsolid 4.5 cm posterior apical right upper lobe lung mass compatible with the non-small cell malignancy.  There was no other hypermetabolic activity in the thoracic adenopathy or distant metastatic disease.   The patient underwent curative radiotherapy under the care of Dr. Sondra Come. She tolerated the treatment well with no concerning adverse effects. She is here today for evaluation with repeat CT scan of the chest for restaging of her disease. I discussed the scan results with the patient and her son.  Her scan showed no concerning findings for disease progression. I recommended for her to continue on observation with repeat CT scan of the chest in 6 months. The patient was advised to call immediately if she has any concerning symptoms in the interval. The patient voices understanding of current disease status and treatment options and is in agreement with the current care plan. Determined patient/son have no questions or concerns related to patient's Cancer treatment plan at this time  Discussed plans with patient for ongoing care management follow up and provided patient with direct contact information for care management team Patient Goals/Self Care Activities:  Keep all MD follow up appointments as scheduled Call your Oncology navigator for questions related to Cancer diagnosis and next steps Continue to work with PCP and CCM team for disease education and support and or for assistance with care coordination as needed   Follow Up Plan: Telephone follow up appointment with care management team member scheduled for: 12/17/20     Care Plan : Chronic Kidney (Adult)  Updates made by Lynne Logan, RN since 11/11/2020 12:00 AM     Problem: Disease Progression   Priority: Medium     Long-Range Goal: Disease Progression Prevented or Minimized   Start Date: 07/26/2020  Expected End Date: 07/26/2021  Recent Progress: On track  Priority: Medium  Note:   Objective:  No  results found for: HGBA1C Lab Results  Component Value Date   CREATININE 1.72 (H) 10/28/2020   CREATININE 1.42 (H) 06/08/2020   CREATININE 1.39 (H) 05/21/2020   No results found for: EGFR Current Barriers:  Ineffective Self Health Maintenance Unable to self administer medications as prescribed Unable to perform ADLs independently Unable to perform IADLs independently Cognitive deficit  Clinical Goal(s):  Collaboration with [name], FNP regarding development and update of comprehensive plan of care as evidenced by provider attestation and co-signature Inter-disciplinary care team collaboration (see longitudinal plan of care) patient will work with care management team to address care coordination and chronic disease management needs related to Disease Management Educational Needs Care Coordination Medication Management and Education Psychosocial Support Dementia and Caregiver Support   Interventions:  11/11/20 completed successful outbound call with son [name] Evaluation of current treatment plan related to CKD Stage III and patient's adherence to plan as established by provider. Collaboration with [name], FNP regarding development and update of comprehensive plan of care as evidenced by provider attestation       and co-signature  Inter-disciplinary care team collaboration (see longitudinal plan of care) Reviewed and discussed current GFR and educated on stages of renal disease Educated on importance of drinking plenty of water, at least 64 oz per day unless otherwise directed  Educated on importance of taking all medications exactly as prescribed and to keep all MD follow up appointments  Discussed plans with patient for ongoing care management follow up and provided patient with direct contact information for care management team Self Care Activities:  Continue to keep all MD follow up appointments for ongoing monitoring of renal fuction Continue to take all  medications exactly as prescribed  Continue to call the pharmacy for refills at least 7 days before taking last dose Notify the CCM team and or PCP if you are unable to afford your medication refills Drink plenty of water (64 oz daily) unless otherwise directed  Call the CCM team and or PCP for questions or concerns  Patient Goals: - to maintain/improve renal function   Follow Up Plan: Telephone follow up appointment with care management team member scheduled for:  12/17/20    Care Plan : Dementia (Adult)  Updates made by Lynne Logan, RN since 11/11/2020 12:00 AM     Problem: Cognitive Function   Priority: High     Long-Range Goal: Optimal Cognitive Function   Start Date: 07/26/2020  Expected End Date: 07/26/2021  Recent Progress: On track  Priority: High  Note:   Current Barriers:  Ineffective Self Health Maintenance Unable to self administer medications as prescribed Unable to perform ADLs independently Unable to perform IADLs independently Cognitive deficit  Clinical Goal(s):  Collaboration with [name], FNP regarding development and update of comprehensive plan of care as evidenced by provider attestation and co-signature Inter-disciplinary care team collaboration (see longitudinal plan of care) patient will work with care management team to address care coordination and chronic disease management needs related to Disease Management Educational Needs Care Coordination Medication Management and Education Psychosocial Support Dementia and Caregiver Support   Interventions:  11/11/20 completed successful outbound call with son [name] Evaluation of current treatment plan related to Dementia and patient's adherence to plan as established by provider. Collaboration with [name], FNP regarding development and update of comprehensive plan of care as evidenced by provider attestation       and co-signature Inter-disciplinary care team collaboration (see longitudinal  plan of care) Determined patient is showing signs of worsening agitation with behavior changes Discussed son [name] is having more difficulty caring for his mother as she is uncooperative with bathing, dressing and participating with other daily routines, she is wandering at nighttime and her cognitive ability and judgement are more impaired Educated son regarding the disease process of dementia and Alzheimer's and what to expect as the disease progresses Discussed home safety concerns requiring patient to be supervised 24/7 and determined son [name] is unable to provide 24/7 care, he is interested in long term care placement  Collaborated with embedded BSW regarding son's request to have help with placing his mother in a long term care facility  Reviewed scheduled/upcoming provider appointment including: Neurologist, Dr. Brett Fairy scheduled for 11/18/20 for evaluation of dementia/Alzheimer's  Instructed [name] to keep the PCP and CCM team well informed of new or worsening symptoms related to patient's dementia and or behavior changes Discussed plans with patient for ongoing care management follow up and provided patient with direct contact information for care management team Self Care Activities:  Continue to keep all MD follow up appointments  Continue  to take all medications exactly as prescribed  Continue to call the pharmacy for refills at least 7 days before taking last dose Notify the CCM team and or PCP if you are unable to afford your medication refills Call the CCM team and or PCP for questions or concerns  Patient Goals: - Maintain Optimal Cognitive Function   Follow Up Plan: Telephone follow up appointment with care management team member scheduled for: 12/17/20     Plan:Telephone follow up appointment with care management team member scheduled for:  12/17/20  Barb Merino, RN, BSN, CCM Care Management Coordinator [facility]  Direct Phone:  [phone]

## 2020-11-22 ENCOUNTER — Encounter (HOSPITAL_COMMUNITY): Payer: Self-pay

## 2020-11-22 ENCOUNTER — Emergency Department (HOSPITAL_COMMUNITY): Payer: Medicare Other

## 2020-11-22 ENCOUNTER — Inpatient Hospital Stay (HOSPITAL_COMMUNITY)
Admission: EM | Admit: 2020-11-22 | Discharge: 2020-11-27 | DRG: 690 | Disposition: A | Discharge status: Skilled Nursing Facility | Payer: Medicare Other | Attending: Internal Medicine | Admitting: Internal Medicine

## 2020-11-22 ENCOUNTER — Telehealth: Payer: Self-pay

## 2020-11-22 ENCOUNTER — Telehealth: Payer: Medicare Other

## 2020-11-22 DIAGNOSIS — W010XXA Fall on same level from slipping, tripping and stumbling without subsequent striking against object, initial encounter: Secondary | ICD-10-CM | POA: Diagnosis present

## 2020-11-22 DIAGNOSIS — Z885 Allergy status to narcotic agent status: Secondary | ICD-10-CM

## 2020-11-22 DIAGNOSIS — I700 Atherosclerosis of aorta: Secondary | ICD-10-CM | POA: Diagnosis not present

## 2020-11-22 DIAGNOSIS — Z833 Family history of diabetes mellitus: Secondary | ICD-10-CM

## 2020-11-22 DIAGNOSIS — N183 Chronic kidney disease, stage 3 unspecified: Secondary | ICD-10-CM | POA: Diagnosis present

## 2020-11-22 DIAGNOSIS — I130 Hypertensive heart and chronic kidney disease with heart failure and stage 1 through stage 4 chronic kidney disease, or unspecified chronic kidney disease: Secondary | ICD-10-CM | POA: Diagnosis not present

## 2020-11-22 DIAGNOSIS — G301 Alzheimer's disease with late onset: Secondary | ICD-10-CM | POA: Diagnosis present

## 2020-11-22 DIAGNOSIS — Z720 Tobacco use: Secondary | ICD-10-CM | POA: Diagnosis present

## 2020-11-22 DIAGNOSIS — Z7951 Long term (current) use of inhaled steroids: Secondary | ICD-10-CM

## 2020-11-22 DIAGNOSIS — Y92009 Unspecified place in unspecified non-institutional (private) residence as the place of occurrence of the external cause: Secondary | ICD-10-CM

## 2020-11-22 DIAGNOSIS — M7989 Other specified soft tissue disorders: Secondary | ICD-10-CM | POA: Diagnosis not present

## 2020-11-22 DIAGNOSIS — R404 Transient alteration of awareness: Secondary | ICD-10-CM | POA: Diagnosis not present

## 2020-11-22 DIAGNOSIS — W102XXA Fall (on)(from) incline, initial encounter: Secondary | ICD-10-CM | POA: Diagnosis present

## 2020-11-22 DIAGNOSIS — N179 Acute kidney failure, unspecified: Secondary | ICD-10-CM | POA: Diagnosis not present

## 2020-11-22 DIAGNOSIS — S43082A Other subluxation of left shoulder joint, initial encounter: Secondary | ICD-10-CM | POA: Diagnosis not present

## 2020-11-22 DIAGNOSIS — W19XXXD Unspecified fall, subsequent encounter: Secondary | ICD-10-CM | POA: Diagnosis not present

## 2020-11-22 DIAGNOSIS — D7589 Other specified diseases of blood and blood-forming organs: Secondary | ICD-10-CM | POA: Diagnosis present

## 2020-11-22 DIAGNOSIS — L89152 Pressure ulcer of sacral region, stage 2: Secondary | ICD-10-CM | POA: Diagnosis present

## 2020-11-22 DIAGNOSIS — Z79899 Other long term (current) drug therapy: Secondary | ICD-10-CM

## 2020-11-22 DIAGNOSIS — E7800 Pure hypercholesterolemia, unspecified: Secondary | ICD-10-CM | POA: Diagnosis present

## 2020-11-22 DIAGNOSIS — G3183 Dementia with Lewy bodies: Secondary | ICD-10-CM | POA: Diagnosis present

## 2020-11-22 DIAGNOSIS — E785 Hyperlipidemia, unspecified: Secondary | ICD-10-CM | POA: Diagnosis present

## 2020-11-22 DIAGNOSIS — I5032 Chronic diastolic (congestive) heart failure: Secondary | ICD-10-CM | POA: Diagnosis not present

## 2020-11-22 DIAGNOSIS — Z7982 Long term (current) use of aspirin: Secondary | ICD-10-CM

## 2020-11-22 DIAGNOSIS — I6523 Occlusion and stenosis of bilateral carotid arteries: Secondary | ICD-10-CM | POA: Diagnosis not present

## 2020-11-22 DIAGNOSIS — F02818 Dementia in other diseases classified elsewhere, unspecified severity, with other behavioral disturbance: Secondary | ICD-10-CM | POA: Diagnosis present

## 2020-11-22 DIAGNOSIS — I5022 Chronic systolic (congestive) heart failure: Secondary | ICD-10-CM | POA: Diagnosis not present

## 2020-11-22 DIAGNOSIS — M6281 Muscle weakness (generalized): Secondary | ICD-10-CM | POA: Diagnosis not present

## 2020-11-22 DIAGNOSIS — S22070A Wedge compression fracture of T9-T10 vertebra, initial encounter for closed fracture: Secondary | ICD-10-CM | POA: Diagnosis not present

## 2020-11-22 DIAGNOSIS — Z20822 Contact with and (suspected) exposure to covid-19: Secondary | ICD-10-CM | POA: Diagnosis present

## 2020-11-22 DIAGNOSIS — Z923 Personal history of irradiation: Secondary | ICD-10-CM

## 2020-11-22 DIAGNOSIS — R5383 Other fatigue: Secondary | ICD-10-CM | POA: Diagnosis not present

## 2020-11-22 DIAGNOSIS — R1013 Epigastric pain: Secondary | ICD-10-CM | POA: Diagnosis not present

## 2020-11-22 DIAGNOSIS — I252 Old myocardial infarction: Secondary | ICD-10-CM

## 2020-11-22 DIAGNOSIS — I10 Essential (primary) hypertension: Secondary | ICD-10-CM | POA: Diagnosis present

## 2020-11-22 DIAGNOSIS — C3491 Malignant neoplasm of unspecified part of right bronchus or lung: Secondary | ICD-10-CM | POA: Diagnosis not present

## 2020-11-22 DIAGNOSIS — R0902 Hypoxemia: Secondary | ICD-10-CM | POA: Diagnosis not present

## 2020-11-22 DIAGNOSIS — G4733 Obstructive sleep apnea (adult) (pediatric): Secondary | ICD-10-CM | POA: Diagnosis not present

## 2020-11-22 DIAGNOSIS — S22079A Unspecified fracture of T9-T10 vertebra, initial encounter for closed fracture: Secondary | ICD-10-CM | POA: Diagnosis present

## 2020-11-22 DIAGNOSIS — K219 Gastro-esophageal reflux disease without esophagitis: Secondary | ICD-10-CM | POA: Diagnosis not present

## 2020-11-22 DIAGNOSIS — E162 Hypoglycemia, unspecified: Secondary | ICD-10-CM | POA: Diagnosis not present

## 2020-11-22 DIAGNOSIS — R296 Repeated falls: Secondary | ICD-10-CM | POA: Diagnosis present

## 2020-11-22 DIAGNOSIS — I2510 Atherosclerotic heart disease of native coronary artery without angina pectoris: Secondary | ICD-10-CM | POA: Diagnosis not present

## 2020-11-22 DIAGNOSIS — F17210 Nicotine dependence, cigarettes, uncomplicated: Secondary | ICD-10-CM | POA: Diagnosis present

## 2020-11-22 DIAGNOSIS — Z743 Need for continuous supervision: Secondary | ICD-10-CM | POA: Diagnosis not present

## 2020-11-22 DIAGNOSIS — Z800 Family history of malignant neoplasm of digestive organs: Secondary | ICD-10-CM

## 2020-11-22 DIAGNOSIS — N390 Urinary tract infection, site not specified: Principal | ICD-10-CM | POA: Diagnosis present

## 2020-11-22 DIAGNOSIS — R262 Difficulty in walking, not elsewhere classified: Secondary | ICD-10-CM | POA: Diagnosis not present

## 2020-11-22 DIAGNOSIS — Z043 Encounter for examination and observation following other accident: Secondary | ICD-10-CM | POA: Diagnosis not present

## 2020-11-22 DIAGNOSIS — F0281 Dementia in other diseases classified elsewhere with behavioral disturbance: Secondary | ICD-10-CM | POA: Diagnosis present

## 2020-11-22 DIAGNOSIS — F5104 Psychophysiologic insomnia: Secondary | ICD-10-CM | POA: Diagnosis present

## 2020-11-22 DIAGNOSIS — Z8541 Personal history of malignant neoplasm of cervix uteri: Secondary | ICD-10-CM | POA: Diagnosis not present

## 2020-11-22 DIAGNOSIS — R6889 Other general symptoms and signs: Secondary | ICD-10-CM | POA: Diagnosis not present

## 2020-11-22 DIAGNOSIS — M4322 Fusion of spine, cervical region: Secondary | ICD-10-CM | POA: Diagnosis not present

## 2020-11-22 DIAGNOSIS — R600 Localized edema: Secondary | ICD-10-CM | POA: Diagnosis present

## 2020-11-22 DIAGNOSIS — J449 Chronic obstructive pulmonary disease, unspecified: Secondary | ICD-10-CM | POA: Diagnosis present

## 2020-11-22 DIAGNOSIS — R2681 Unsteadiness on feet: Secondary | ICD-10-CM | POA: Diagnosis not present

## 2020-11-22 DIAGNOSIS — C3411 Malignant neoplasm of upper lobe, right bronchus or lung: Secondary | ICD-10-CM | POA: Diagnosis not present

## 2020-11-22 DIAGNOSIS — Z888 Allergy status to other drugs, medicaments and biological substances status: Secondary | ICD-10-CM

## 2020-11-22 DIAGNOSIS — Z85118 Personal history of other malignant neoplasm of bronchus and lung: Secondary | ICD-10-CM

## 2020-11-22 DIAGNOSIS — E161 Other hypoglycemia: Secondary | ICD-10-CM | POA: Diagnosis not present

## 2020-11-22 DIAGNOSIS — Z8249 Family history of ischemic heart disease and other diseases of the circulatory system: Secondary | ICD-10-CM

## 2020-11-22 DIAGNOSIS — I5042 Chronic combined systolic (congestive) and diastolic (congestive) heart failure: Secondary | ICD-10-CM | POA: Diagnosis present

## 2020-11-22 DIAGNOSIS — B9620 Unspecified Escherichia coli [E. coli] as the cause of diseases classified elsewhere: Secondary | ICD-10-CM | POA: Diagnosis present

## 2020-11-22 DIAGNOSIS — Z955 Presence of coronary angioplasty implant and graft: Secondary | ICD-10-CM | POA: Diagnosis not present

## 2020-11-22 DIAGNOSIS — M25522 Pain in left elbow: Secondary | ICD-10-CM | POA: Diagnosis not present

## 2020-11-22 DIAGNOSIS — W19XXXA Unspecified fall, initial encounter: Secondary | ICD-10-CM

## 2020-11-22 DIAGNOSIS — M7731 Calcaneal spur, right foot: Secondary | ICD-10-CM | POA: Diagnosis not present

## 2020-11-22 DIAGNOSIS — N182 Chronic kidney disease, stage 2 (mild): Secondary | ICD-10-CM | POA: Diagnosis present

## 2020-11-22 DIAGNOSIS — Z66 Do not resuscitate: Secondary | ICD-10-CM | POA: Diagnosis not present

## 2020-11-22 DIAGNOSIS — I255 Ischemic cardiomyopathy: Secondary | ICD-10-CM | POA: Diagnosis present

## 2020-11-22 DIAGNOSIS — J9811 Atelectasis: Secondary | ICD-10-CM | POA: Diagnosis not present

## 2020-11-22 DIAGNOSIS — L989 Disorder of the skin and subcutaneous tissue, unspecified: Secondary | ICD-10-CM | POA: Diagnosis not present

## 2020-11-22 LAB — CBC
HCT: 38.3 % (ref 36.0–46.0)
Hemoglobin: 11.4 g/dL — ABNORMAL LOW (ref 12.0–15.0)
MCH: 30.4 pg (ref 26.0–34.0)
MCHC: 29.8 g/dL — ABNORMAL LOW (ref 30.0–36.0)
MCV: 102.1 fL — ABNORMAL HIGH (ref 80.0–100.0)
Platelets: 232 10*3/uL (ref 150–400)
RBC: 3.75 MIL/uL — ABNORMAL LOW (ref 3.87–5.11)
RDW: 13.2 % (ref 11.5–15.5)
WBC: 5.9 10*3/uL (ref 4.0–10.5)
nRBC: 0 % (ref 0.0–0.2)

## 2020-11-22 LAB — URINALYSIS, ROUTINE W REFLEX MICROSCOPIC
Bilirubin Urine: NEGATIVE
Glucose, UA: NEGATIVE mg/dL
Hgb urine dipstick: NEGATIVE
Ketones, ur: NEGATIVE mg/dL
Leukocytes,Ua: NEGATIVE
Nitrite: POSITIVE — AB
Protein, ur: NEGATIVE mg/dL
Specific Gravity, Urine: 1.012 (ref 1.005–1.030)
pH: 5 (ref 5.0–8.0)

## 2020-11-22 LAB — CBC WITH DIFFERENTIAL/PLATELET
Abs Immature Granulocytes: 0.03 10*3/uL (ref 0.00–0.07)
Basophils Absolute: 0 10*3/uL (ref 0.0–0.1)
Basophils Relative: 0 %
Eosinophils Absolute: 0 10*3/uL (ref 0.0–0.5)
Eosinophils Relative: 1 %
HCT: 42 % (ref 36.0–46.0)
Hemoglobin: 12.5 g/dL (ref 12.0–15.0)
Immature Granulocytes: 1 %
Lymphocytes Relative: 15 %
Lymphs Abs: 0.8 10*3/uL (ref 0.7–4.0)
MCH: 30.4 pg (ref 26.0–34.0)
MCHC: 29.8 g/dL — ABNORMAL LOW (ref 30.0–36.0)
MCV: 102.2 fL — ABNORMAL HIGH (ref 80.0–100.0)
Monocytes Absolute: 0.3 10*3/uL (ref 0.1–1.0)
Monocytes Relative: 6 %
Neutro Abs: 4.5 10*3/uL (ref 1.7–7.7)
Neutrophils Relative %: 77 %
Platelets: 257 10*3/uL (ref 150–400)
RBC: 4.11 MIL/uL (ref 3.87–5.11)
RDW: 13.4 % (ref 11.5–15.5)
WBC: 5.7 10*3/uL (ref 4.0–10.5)
nRBC: 0 % (ref 0.0–0.2)

## 2020-11-22 LAB — COMPREHENSIVE METABOLIC PANEL
ALT: 19 U/L (ref 0–44)
AST: 22 U/L (ref 15–41)
Albumin: 3.4 g/dL — ABNORMAL LOW (ref 3.5–5.0)
Alkaline Phosphatase: 103 U/L (ref 38–126)
Anion gap: 9 (ref 5–15)
BUN: 24 mg/dL — ABNORMAL HIGH (ref 8–23)
CO2: 23 mmol/L (ref 22–32)
Calcium: 9.5 mg/dL (ref 8.9–10.3)
Chloride: 106 mmol/L (ref 98–111)
Creatinine, Ser: 1.23 mg/dL — ABNORMAL HIGH (ref 0.44–1.00)
GFR, Estimated: 44 mL/min — ABNORMAL LOW (ref >60)
Glucose, Bld: 86 mg/dL (ref 70–99)
Potassium: 4.6 mmol/L (ref 3.5–5.1)
Sodium: 138 mmol/L (ref 135–145)
Total Bilirubin: 0.9 mg/dL (ref 0.3–1.2)
Total Protein: 6.8 g/dL (ref 6.5–8.1)

## 2020-11-22 LAB — BRAIN NATRIURETIC PEPTIDE: B Natriuretic Peptide: 51.3 pg/mL (ref 0.0–100.0)

## 2020-11-22 LAB — RESP PANEL BY RT-PCR (FLU A&B, COVID) ARPGX2
Influenza A by PCR: NEGATIVE
Influenza B by PCR: NEGATIVE
SARS Coronavirus 2 by RT PCR: NEGATIVE

## 2020-11-22 LAB — TSH: TSH: 0.918 u[IU]/mL (ref 0.350–4.500)

## 2020-11-22 LAB — LACTIC ACID, PLASMA: Lactic Acid, Venous: 1.7 mmol/L (ref 0.5–1.9)

## 2020-11-22 MED ORDER — CHOLECALCIFEROL 10 MCG (400 UNIT) PO TABS
1000.0000 [IU] | ORAL_TABLET | Freq: Every day | ORAL | Status: DC
  Administered 2020-11-23 – 2020-11-27 (×5): 1000 [IU] via ORAL
  Filled 2020-11-22 (×5): qty 3

## 2020-11-22 MED ORDER — SODIUM CHLORIDE 0.9% FLUSH
3.0000 mL | Freq: Two times a day (BID) | INTRAVENOUS | Status: DC
  Administered 2020-11-22 – 2020-11-27 (×5): 3 mL via INTRAVENOUS

## 2020-11-22 MED ORDER — SODIUM CHLORIDE 0.9% FLUSH: 3.0000 mL | INTRAVENOUS | Status: DC | PRN

## 2020-11-22 MED ORDER — MEMANTINE HCL 5 MG PO TABS
5.0000 mg | ORAL_TABLET | Freq: Every day | ORAL | Status: DC
  Administered 2020-11-23 – 2020-11-27 (×5): 5 mg via ORAL
  Filled 2020-11-22 (×5): qty 1

## 2020-11-22 MED ORDER — SODIUM CHLORIDE 0.9 % IV SOLN
1.0000 g | Freq: Once | INTRAVENOUS | Status: AC
  Administered 2020-11-22: 1 g via INTRAVENOUS
  Filled 2020-11-22: qty 10

## 2020-11-22 MED ORDER — ASPIRIN EC 81 MG PO TBEC: 81.0000 mg | DELAYED_RELEASE_TABLET | Freq: Every day | ORAL | Status: DC | PRN

## 2020-11-22 MED ORDER — QUETIAPINE FUMARATE 50 MG PO TABS
50.0000 mg | ORAL_TABLET | Freq: Every day | ORAL | Status: DC
  Administered 2020-11-22 – 2020-11-26 (×5): 50 mg via ORAL
  Filled 2020-11-22 (×5): qty 1

## 2020-11-22 MED ORDER — SODIUM CHLORIDE 0.9 % IV BOLUS
500.0000 mL | Freq: Once | INTRAVENOUS | Status: AC
  Administered 2020-11-22: 500 mL via INTRAVENOUS

## 2020-11-22 MED ORDER — DONEPEZIL HCL 10 MG PO TABS
10.0000 mg | ORAL_TABLET | Freq: Every day | ORAL | Status: DC
  Administered 2020-11-22 – 2020-11-26 (×5): 10 mg via ORAL
  Filled 2020-11-22 (×5): qty 1

## 2020-11-22 MED ORDER — ENOXAPARIN SODIUM 40 MG/0.4ML IJ SOSY
40.0000 mg | PREFILLED_SYRINGE | INTRAMUSCULAR | Status: DC
  Administered 2020-11-22 – 2020-11-26 (×5): 40 mg via SUBCUTANEOUS
  Filled 2020-11-22 (×5): qty 0.4

## 2020-11-22 MED ORDER — OXYBUTYNIN CHLORIDE 5 MG PO TABS
2.5000 mg | ORAL_TABLET | Freq: Two times a day (BID) | ORAL | Status: DC
  Administered 2020-11-22 – 2020-11-27 (×10): 2.5 mg via ORAL
  Filled 2020-11-22 (×10): qty 1

## 2020-11-22 MED ORDER — ACETAMINOPHEN 650 MG RE SUPP: 650.0000 mg | Freq: Four times a day (QID) | RECTAL | Status: DC | PRN

## 2020-11-22 MED ORDER — ADULT MULTIVITAMIN W/MINERALS CH
1.0000 | ORAL_TABLET | Freq: Every day | ORAL | Status: DC
  Administered 2020-11-23 – 2020-11-27 (×5): 1 via ORAL
  Filled 2020-11-22 (×5): qty 1

## 2020-11-22 MED ORDER — TRAMADOL HCL 50 MG PO TABS: 100.0000 mg | ORAL_TABLET | Freq: Every evening | ORAL | Status: DC | PRN

## 2020-11-22 MED ORDER — MOMETASONE FURO-FORMOTEROL FUM 200-5 MCG/ACT IN AERO
2.0000 | INHALATION_SPRAY | Freq: Two times a day (BID) | RESPIRATORY_TRACT | Status: DC
  Administered 2020-11-23 – 2020-11-27 (×8): 2 via RESPIRATORY_TRACT
  Filled 2020-11-22: qty 8.8

## 2020-11-22 MED ORDER — ACETAMINOPHEN 325 MG PO TABS
650.0000 mg | ORAL_TABLET | Freq: Once | ORAL | Status: AC
  Administered 2020-11-22: 650 mg via ORAL
  Filled 2020-11-22: qty 2

## 2020-11-22 MED ORDER — POTASSIUM CHLORIDE CRYS ER 10 MEQ PO TBCR
40.0000 meq | EXTENDED_RELEASE_TABLET | Freq: Every day | ORAL | Status: DC
  Administered 2020-11-23 – 2020-11-27 (×5): 40 meq via ORAL
  Filled 2020-11-22 (×5): qty 4

## 2020-11-22 MED ORDER — TRAMADOL HCL 50 MG PO TABS
50.0000 mg | ORAL_TABLET | Freq: Two times a day (BID) | ORAL | Status: DC
  Administered 2020-11-22 – 2020-11-23 (×2): 50 mg via ORAL
  Filled 2020-11-22 (×2): qty 1

## 2020-11-22 MED ORDER — SODIUM CHLORIDE 0.9 % IV SOLN
1.0000 g | INTRAVENOUS | Status: DC
  Administered 2020-11-22 – 2020-11-24 (×3): 1 g via INTRAVENOUS
  Filled 2020-11-22: qty 1
  Filled 2020-11-22: qty 10
  Filled 2020-11-22: qty 1
  Filled 2020-11-22: qty 10

## 2020-11-22 MED ORDER — FERROUS SULFATE 325 (65 FE) MG PO TABS
325.0000 mg | ORAL_TABLET | Freq: Every day | ORAL | Status: DC
  Administered 2020-11-23 – 2020-11-27 (×5): 325 mg via ORAL
  Filled 2020-11-22 (×5): qty 1

## 2020-11-22 MED ORDER — NITROGLYCERIN 0.4 MG SL SUBL: 0.4000 mg | SUBLINGUAL_TABLET | SUBLINGUAL | Status: DC | PRN

## 2020-11-22 MED ORDER — ACETAMINOPHEN 325 MG PO TABS: 650.0000 mg | ORAL_TABLET | Freq: Four times a day (QID) | ORAL | Status: DC | PRN

## 2020-11-22 MED ORDER — GABAPENTIN 100 MG PO CAPS
100.0000 mg | ORAL_CAPSULE | Freq: Two times a day (BID) | ORAL | Status: DC
  Administered 2020-11-22 – 2020-11-23 (×2): 100 mg via ORAL
  Filled 2020-11-22 (×2): qty 1

## 2020-11-22 MED ORDER — PANTOPRAZOLE SODIUM 40 MG PO TBEC
40.0000 mg | DELAYED_RELEASE_TABLET | Freq: Two times a day (BID) | ORAL | Status: DC
  Administered 2020-11-22 – 2020-11-27 (×10): 40 mg via ORAL
  Filled 2020-11-22 (×10): qty 1

## 2020-11-22 MED ORDER — SODIUM CHLORIDE 0.9 % IV SOLN: 250.0000 mL | INTRAVENOUS | Status: DC | PRN

## 2020-11-22 MED ORDER — ROSUVASTATIN CALCIUM 20 MG PO TABS
40.0000 mg | ORAL_TABLET | Freq: Every day | ORAL | Status: DC
  Administered 2020-11-23 – 2020-11-27 (×5): 40 mg via ORAL
  Filled 2020-11-22 (×5): qty 2

## 2020-11-22 MED ORDER — FUROSEMIDE 10 MG/ML IJ SOLN
40.0000 mg | Freq: Once | INTRAMUSCULAR | Status: AC
  Administered 2020-11-22: 40 mg via INTRAVENOUS
  Filled 2020-11-22: qty 4

## 2020-11-22 MED ORDER — METOPROLOL TARTRATE 25 MG PO TABS
25.0000 mg | ORAL_TABLET | Freq: Two times a day (BID) | ORAL | Status: DC
  Administered 2020-11-23 – 2020-11-27 (×9): 25 mg via ORAL
  Filled 2020-11-22 (×3): qty 1
  Filled 2020-11-22 (×2): qty 2
  Filled 2020-11-22 (×4): qty 1

## 2020-11-22 MED ORDER — MAGNESIUM OXIDE -MG SUPPLEMENT 400 (240 MG) MG PO TABS
200.0000 mg | ORAL_TABLET | Freq: Every day | ORAL | Status: DC
  Administered 2020-11-23 – 2020-11-24 (×2): 200 mg via ORAL
  Filled 2020-11-22 (×2): qty 1

## 2020-11-22 NOTE — Progress Notes (Signed)
CSW contacted patients social worker, [name] and left a message.

## 2020-11-22 NOTE — Progress Notes (Addendum)
Patients social worker, [name] with [facility] care management is assisting family with memory care placement. Patients son still needs to fill out medicaid application so patient can qualify for long term care. This process could take a few weeks to a month for completion. Patient will not be able to stay in the ED for weeks to be placed in memory care. Ms. Peggye Ley plans on sending information to [facility] for placement.

## 2020-11-22 NOTE — ED Notes (Signed)
Pt was eating when I arrived to draw labs. I was asked politely to return after the pt finished eating.

## 2020-11-22 NOTE — ED Notes (Signed)
Patient transported to X-ray 

## 2020-11-22 NOTE — H&P (Signed)
History and Physical    Sara Fox EQA:[id number] DOB: 09/03/1940 DOA: 11/22/2020  PCP: [name], FNP Consultants:  neurology: Dr. Brett Fairy, oncology: Dr. Julien Nordmann, cardiology: Dr. Debara Pickett  Patient coming from: EMS from home. lives with son  Chief Complaint: fall  HPI: Sara Fox is a 80 y.o. 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 She states her whole left side is hurting. History from her son as she is a poor historian. He tells me that he thinks she tripped over some pillows on the floor as she was trying to go to the bathroom and possibly hit her left side on the desk as she fell, but didn't see her fall. He called 911.    She was supposed to have been seen for a possible UTI recently but was not seen at office. Son also noticed her legs have been swelling over the last 2-3 weeks. Denies any SOB, chest pain or cough. No weight gain. She has not had fever/chills, stomach pain, N/V, headache. Has been okay until the fall.   She has fallen another time this month at home. Son is trying to get her placed at a SNF, but is working to fill out [employer]. Has a Product/process development scientist.    ED Course: vitals: bp: 164/75, HR: 61, afebrile, RR: 13 and oxygen 93% on room air. Creatinine: 1.23 (1.04-1.72), MCV: 102 and UA: +nitrite, suspicious for infection. Multiple xrays done. CTH negative CT cervical spine negative. Asked to admit for fall and UTI.   Review of Systems: As per HPI; otherwise review of systems reviewed and negative.   Ambulatory Status:  Ambulates independently around house, but uses cane if she goes outside of the house.   Past Medical History:  Diagnosis Date   Acute kidney injury ([place])    Back pain    Cervical cancer (HCC)    CHF (congestive heart failure) (HCC)    COPD (chronic  obstructive pulmonary disease) (HCC)    Coronary artery disease    s/p DES x2 to RCA 2016   GERD (gastroesophageal reflux disease)    History of radiation therapy 07/27/20-08/16/20   Right lung- SBRT- Dr. Gery Pray    Hyperlipidemia    Hypertension    Mild cognitive impairment    Myocardial infarction [facility])    Neck pain    Rhabdomyolysis    Stomach problems     Past Surgical History:  Procedure Laterality Date   ABDOMINAL HYSTERECTOMY     BACK SURGERY  2012   lower back   BRONCHIAL BIOPSY  05/25/2020   Procedure: BRONCHIAL BIOPSIES;  Surgeon: Collene Gobble, MD;  Location: [facility] ENDOSCOPY;  Service: Pulmonary;;   BRONCHIAL BRUSHINGS  05/25/2020   Procedure: BRONCHIAL BRUSHINGS;  Surgeon: Collene Gobble, MD;  Location: [facility] ENDOSCOPY;  Service: Pulmonary;;   BRONCHIAL NEEDLE ASPIRATION BIOPSY  05/25/2020   Procedure: BRONCHIAL NEEDLE ASPIRATION BIOPSIES;  Surgeon: Collene Gobble, MD;  Location: [place] ENDOSCOPY;  Service: Pulmonary;;   CARDIAC CATHETERIZATION  06/1999   noncritical disease invovling PDA   CARDIAC CATHETERIZATION N/A 09/14/2014   Procedure: Left Heart Cath and Coronary Angiography;  Surgeon: Leonie Man, MD;  Location: [place] INVASIVE CV LAB CUPID;  Service: Cardiovascular;  Laterality: N/A;   CHOLECYSTECTOMY     ESOPHAGOGASTRODUODENOSCOPY (EGD) WITH PROPOFOL Left  07/19/2017   Procedure: ESOPHAGOGASTRODUODENOSCOPY (EGD) WITH PROPOFOL;  Surgeon: Ronnette Juniper, MD;  Location: [facility] ENDOSCOPY;  Service: Gastroenterology;  Laterality: Left;   FINE NEEDLE ASPIRATION  05/25/2020   Procedure: FINE NEEDLE ASPIRATION (FNA) LINEAR;  Surgeon: Collene Gobble, MD;  Location: [facility] ENDOSCOPY;  Service: Pulmonary;;   FRACTURE SURGERY Left 05/2017   left wrist   HARDWARE REMOVAL Left 11/07/2017   Procedure: LEFT WRIST HARDWARE REMOVAL;  Surgeon: Charlotte Crumb, MD;  Location: [place];  Service: Orthopedics;  Laterality: Left;   KNEE SURGERY Bilateral 2001 & 2007   NECK SURGERY  2012   2012    PARTIAL GASTRECTOMY  2005   subtotal   PERCUTANEOUS CORONARY STENT INTERVENTION (PCI-S)  09/14/2014   Procedure: Percutaneous Coronary Stent Intervention (Pci-S);  Surgeon: Leonie Man, MD;  Location: [facility] INVASIVE CV LAB CUPID;  Service: Cardiovascular;;   SHOULDER SURGERY Right    TRANSTHORACIC ECHOCARDIOGRAM  06/02/2010   EF=>55%, normal LV systolic function; normal RV systolic function; mild mitral annular calcif; trace TR; AV mildly sclerotic   VIDEO BRONCHOSCOPY WITH ENDOBRONCHIAL NAVIGATION N/A 05/25/2020   Procedure: VIDEO BRONCHOSCOPY WITH ENDOBRONCHIAL NAVIGATION;  Surgeon: Collene Gobble, MD;  Location: [place] ENDOSCOPY;  Service: Pulmonary;  Laterality: N/A;   VIDEO BRONCHOSCOPY WITH ENDOBRONCHIAL ULTRASOUND N/A 05/25/2020   Procedure: VIDEO BRONCHOSCOPY WITH ENDOBRONCHIAL ULTRASOUND;  Surgeon: Collene Gobble, MD;  Location: [place] ENDOSCOPY;  Service: Pulmonary;  Laterality: N/A;   WRIST OSTEOTOMY Left 11/07/2017   Procedure: LEFT WRIST DISTAL ULNA RESECTION WITH EXTENSOR CARPI ULNARIS STABILIZATION;  Surgeon: Charlotte Crumb, MD;  Location: [place];  Service: Orthopedics;  Laterality: Left;    Social History   Socioeconomic History   Marital status: Married    Spouse name: Not on file   Number of children: 2   Years of education: GED   Highest education level: Not on file  Occupational History    Employer: RETIRED  Tobacco Use   Smoking status: Every Day    Packs/day: 0.50    Years: 60.00    Pack years: 30.00    Types: Cigarettes   Smokeless tobacco: Never   Tobacco comments:    currently smokes 1/2 ppd or less  Vaping Use   Vaping Use: Former  Substance and Sexual Activity   Alcohol use: No   Drug use: No   Sexual activity: Not Currently  Other Topics Concern   Not on file  Social History Narrative   Coffee/soda daily    Lives w/ son, [name]   Right handed   Social Determinants of Health   Financial Resource Strain: Low Risk    Difficulty of Paying Living  Expenses: Not hard at all  Food Insecurity: No Food Insecurity   Worried About Charity fundraiser in the Last Year: Never true   Ran Out of Food in the Last Year: Never true  Transportation Needs: No Transportation Needs   Lack of Transportation (Medical): No   Lack of Transportation (Non-Medical): No  Physical Activity: Inactive   Days of Exercise per Week: 0 days   Minutes of Exercise per Session: 0 min  Stress: No Stress Concern Present   Feeling of Stress : Not at all  Social Connections: Not on file  Intimate Partner Violence: Not on file    Allergies  Allergen Reactions   Buprenorphine Hcl Shortness Of Breath    Throat swelling/trouble breathing and lethargic   Morphine And Related Shortness Of Breath    Throat swelling/trouble breathing  and lethargic   Celebrex [Celecoxib] Diarrhea and Swelling    Swelling of legs    Vioxx [Rofecoxib] Palpitations   Codeine Other (See Comments)    Bloated     Family History  Problem Relation Age of Onset   Heart disease Mother        also HTN   Diabetes Mother    Colon cancer Mother    Heart disease Brother        deceased at 23   Hypertension Brother    Heart attack Brother    Heart disease Brother    Hypertension Brother     Prior to Admission medications   Medication Sig Start Date End Date Taking? Authorizing Provider  aspirin EC 81 MG tablet Take 81 mg by mouth daily.    [provider]  Calcium Citrate-Vitamin D (CALCIUM CITRATE+D3 PETITES PO) Take 1 tablet by mouth daily.    [provider]  cholecalciferol (VITAMIN D) 25 MCG (1000 UNIT) tablet Take 1,000 Units by mouth daily.    [provider]  ciclopirox (PENLAC) 8 % solution Apply 1 application topically at bedtime. Apply over nail and surrounding skin. Apply daily over previous coat. After seven (7) days, file nail and continue cycle. 05/25/20   Collene Gobble, MD  [employer] MAGNESIUM OXIDE 250 MG TABS TAKE 1 TABLET BY MOUTH WITH EVENING MEAL  DAILY 07/19/20   [name], FNP  donepezil (ARICEPT) 10 MG tablet Take 1 tablet (10 mg total) by mouth at bedtime. 08/03/20   [name], FNP  ferrous sulfate 325 (65 FE) MG tablet Take 325 mg by mouth daily with breakfast.    [provider]  furosemide (LASIX) 20 MG tablet TAKE 1 TABLET BY MOUTH EVERY OTHER DAY 07/26/20   [name], FNP  gabapentin (NEURONTIN) 100 MG capsule Take 1 capsule (100 mg total) by mouth 2 (two) times daily. 09/23/20   [name], FNP  Magnesium 250 MG TABS Take 250 mg by mouth every evening.    [provider]  memantine (NAMENDA) 5 MG tablet Take 1 tablet (5 mg total) by mouth daily. 11/02/20   [name], FNP  metoprolol tartrate (LOPRESSOR) 25 MG tablet TAKE 1 TABLET BY MOUTH TWICE A DAY 06/16/20   Hilty, Nadean Corwin, MD  Multiple Vitamin (MULTIVITAMIN WITH MINERALS) TABS tablet Take 1 tablet by mouth daily.    [provider]  nitroGLYCERIN (NITROSTAT) 0.4 MG SL tablet PLACE ONE TABLET UNDER THE TONGUE EVERY 5 MINUTES AS NEEDED FOR CHEST PAIN. 10/25/20   Hilty, Nadean Corwin, MD  oxybutynin (DITROPAN) 5 MG tablet Take 0.5 tablets (2.5 mg total) by mouth 2 (two) times daily. 11/12/20   [name], FNP  pantoprazole (PROTONIX) 40 MG tablet Take 1 tablet (40 mg total) by mouth 2 (two) times daily. 07/26/20   [name], FNP  polyethylene glycol [facility] / Floria Raveling) packet Take 17 g by mouth daily as needed for mild constipation.    [provider]  Potassium Chloride ER 20 MEQ TBCR TAKE 2 TABLETS BY MOUTH EVERY MORNING Patient taking differently: Take 40 mEq by mouth daily. 01/07/20   Pixie Casino, MD  QUEtiapine (SEROQUEL) 50 MG tablet TAKE 1 TABLET BY MOUTH EVERYDAY AT BEDTIME 11/02/20   [name], FNP  rosuvastatin (CRESTOR) 40 MG tablet TAKE 1 TABLET BY MOUTH ONCE DAILY 11/01/20   Hilty, Nadean Corwin, MD  SYMBICORT 160-4.5 MCG/ACT inhaler TAKE 2 PUFFS BY MOUTH TWICE A DAY 07/26/20   Laurance Flatten,  Doreene Burke, FNP  traMADol (ULTRAM) 50  MG tablet TAKE 1 TABLET 3 TIMES A DAY AND CAN TAKE 1 EXTRA 20 DAYS/MONTH 09/02/20   Charlett Blake, MD    Physical Exam: Vitals:   11/22/20 1403 11/22/20 1500 11/22/20 1615 11/22/20 2101  BP:  (!) 165/77 (!) 164/75 102/64  Pulse:  (!) 54 61 (!) 56  Resp:  10 13 18   Temp: (!) 97 F (36.1 C)   97.8 F (36.6 C)  TempSrc: Oral   Oral  SpO2:  96% 92% 97%  Weight:    59.3 kg     General:  Appears calm and comfortable and is in NAD Eyes:  PERRL, EOMI, normal lids, iris ENT:  grossly normal hearing, lips & tongue, mmm; no teeth  Neck:  no LAD, masses or thyromegaly; no carotid bruits Cardiovascular:  RRR, no m/r/g. Pitting edema below the knee bilaterally symmetrical.  Respiratory:   CTA bilaterally with no wheezes/rales/rhonchi.  Normal respiratory effort. Abdomen:  soft, mildly TTP in epigastric area , ND, NABS Back:   normal alignment, no CVAT. Scar midline in lumbar region  Skin:  no rash or induration seen on limited exam. Small laceration on left forearm  Musculoskeletal:  grossly normal tone BUE/BLE. Hard to assess.  ROM okay Limited foot exam with no ulcerations.  2+ distal pulses. TTP over left lateral ribs/back.  Psychiatric:  grossly normal mood and affect, speech fluent, but confused. No oriented to place, at her baseline.  Neurologic:  CN 2-12 grossly intact, limited exam, moves all extremities in coordinated fashion, sensation intact    Radiological Exams on Admission: Independently reviewed - see discussion in A/P where applicable  DG Chest 1 View  Result Date: 11/22/2020 CLINICAL DATA:  Fall today EXAM: CHEST  1 VIEW COMPARISON:  CT chest 10/28/2020 and chest radiograph 05/25/2020 FINDINGS: The patient is rotated to the right on today's radiograph, reducing diagnostic sensitivity and specificity. Hazy vascular prominence in the lung apices is indeterminate due to the supine positioning. There is a suggestion of mild interstitial accentuation in the lung apices and  potentially some faint airspace opacity at the right lung apex although the rightward rotation makes this a difficult distinction. Atherosclerotic calcification of the aortic arch. Right proximal humeral prosthesis. No pneumothorax. No blunting of the costophrenic angles. Heart size within normal limits. IMPRESSION: 1. Indistinct opacity at the right lung apex possibly artifactual given the degree of rightward rotation and upper zone pulmonary vascular prominence. Small amount of airspace opacity in this vicinity is difficult to exclude. This could be from contusion, atelectasis, or pneumonia. Chest CT could be used to further workup if clinically warranted. 2.  Atherosclerotic calcification of the aortic arch. Electronically Signed   By: Van Clines M.D.   On: 11/22/2020 15:20   DG Thoracic Spine 2 View  Result Date: 11/22/2020 CLINICAL DATA:  Fall.  Pain. EXAM: THORACIC SPINE 2 VIEWS COMPARISON:  Chest CT 10/28/2020. FINDINGS: Study limited by marked osteopenia. T9 compression fracture is stable since chest CT of 10/28/2020. No other definite thoracic spine compression fracture evident although again, assessment is limited by the demineralization. Frontal film shows no abnormal paraspinal line. Upper thoracic spine is largely obscured on the lateral projection. IMPRESSION: Stable T9 compression fracture. No other definite thoracic spine fracture evident although assessment is limited by demineralization and obscuration of the upper thoracic spine on the lateral projection. If there is high clinical index of concern, CT imaging may be warranted. Electronically Signed   By:  Misty Stanley M.D.   On: 11/22/2020 15:05   DG Pelvis 1-2 Views  Result Date: 11/22/2020 CLINICAL DATA:  Fall EXAM: PELVIS - 1-2 VIEW COMPARISON:  CT data from PET-CT dated 06/21/2020 FINDINGS: Posterolateral rod and pedicle screw fixation in the lower lumbar spine and in the S1 level of the sacrum, with the S1 pedicle screws  having surrounding lucency which could imply loosening or infection, but not appreciably changed from 06/21/2020. Bony demineralization is present. Mild sclerosis along the SI joints. Mild to moderate axial loss of articular space in both hips. No compelling findings of fracture. IMPRESSION: 1. No pelvic fracture identified. If the patient is unable to bear weight then cross-sectional imaging may be warranted. 2. Lucency around the S1 pedicle screws as on the prior exam from February, potentially from loosening or infection. 3. Bony demineralization. Electronically Signed   By: Van Clines M.D.   On: 11/22/2020 15:17   DG Elbow Complete Left  Result Date: 11/22/2020 CLINICAL DATA:  Fall, elbow pain EXAM: LEFT ELBOW - COMPLETE 3+ VIEW COMPARISON:  Forearm radiographs of 05/27/2017 FINDINGS: Mild spurring along the epicondyles of the distal humerus. No elbow fracture or malalignment identified. IMPRESSION: 1. No acute bony findings. Electronically Signed   By: Van Clines M.D.   On: 11/22/2020 15:14   CT Head Wo Contrast  Result Date: 11/22/2020 CLINICAL DATA:  Unwitnessed fall.  Dementia. EXAM: CT HEAD WITHOUT CONTRAST TECHNIQUE: Contiguous axial images were obtained from the base of the skull through the vertex without intravenous contrast. COMPARISON:  06/21/2020 MRI brain FINDINGS: Brain: The brainstem, cerebellum, cerebral peduncles, thalami, basal ganglia, basilar cisterns, and ventricular system appear within normal limits. Periventricular white matter and corona radiata hypodensities favor chronic ischemic microvascular white matter disease. No intracranial hemorrhage, mass lesion, or acute CVA. Vascular: There is atherosclerotic calcification of the cavernous carotid arteries bilaterally. Skull: Unremarkable Sinuses/Orbits: Mild chronic left ethmoid sinusitis. Other: Small left pre-auricular subcutaneous lesion, probably a sebaceous cyst or similar benign lesion, not appreciably changed  from 06/21/2020. IMPRESSION: 1. No acute intracranial findings. 2. Periventricular white matter and corona radiata hypodensities favor chronic ischemic microvascular white matter disease. 3. Mild chronic left ethmoid sinusitis. Electronically Signed   By: Van Clines M.D.   On: 11/22/2020 15:26   CT Cervical Spine Wo Contrast  Result Date: 11/22/2020 CLINICAL DATA:  Unwitnessed fall with multifocal pain and history of dementia. EXAM: CT CERVICAL SPINE WITHOUT CONTRAST TECHNIQUE: Multidetector CT imaging of the cervical spine was performed without intravenous contrast. Multiplanar CT image reconstructions were also generated. COMPARISON:  06/26/2013 radiographs FINDINGS: Alignment: The C6-7 level is fused with about 2-3 mm of anterior subluxation at the fused level. No acute subluxation identified. Skull base and vertebrae: Erosions along the base of the odontoid with posterior pannus noted. Erosions along the anterior ring of C1 primarily in the adjacent lateral masses. Sclerosis in the right lateral mass of C1. No acute fracture or acute bony findings identified. Corpectomy at C4 with ACDF at C3-C5. Fused bilateral facet joints at C3-4 and C6-7. Interbody fusion at C6-7. Soft tissues and spinal canal: Atherosclerotic calcification of both common carotid arteries. Disc levels: Intervertebral and facet spurring contribute to osseous foraminal stenosis on the right at C3-4, C4-5, C5-6, and C6-7; and on the left at C2-3, C4-5, C5-6, and C6-7. Upper chest: Posteriorly in the right lung apex we partially include the biopsy-proven non-small cell lung cancer. Other: No supplemental non-categorized findings. IMPRESSION: 1. No acute cervical spine findings. 2.  Multilevel postoperative findings with fusion and corpectomy is noted above. Multilevel impingement. 3. Posteriorly in the apical segment right upper lobe we partially include the biopsy-proven non-small cell lung cancer. Electronically Signed   By: Van Clines M.D.   On: 11/22/2020 15:33   DG Shoulder Left  Result Date: 11/22/2020 CLINICAL DATA:  Fall, shoulder pain EXAM: LEFT SHOULDER - 2+ VIEW COMPARISON:  Humerus radiographs from 01/21/2006 FINDINGS: Humeral head is superiorly subluxed with respect to the glenoid but not overtly dislocated. Severe degenerative glenohumeral arthropathy with subcortical sclerosis, spurring, and some flattening of the contour of the glenoid. No discrete fracture identified. Thoracic spondylosis incidentally noted. IMPRESSION: 1. Severe degenerative glenohumeral arthropathy with superior subluxation of the humeral head but no overt dislocation. Electronically Signed   By: Van Clines M.D.   On: 11/22/2020 15:12   DG Foot Complete Left  Result Date: 11/22/2020 CLINICAL DATA:  Fall.  Leg swelling EXAM: LEFT FOOT - COMPLETE 3+ VIEW COMPARISON:  None. FINDINGS: Truncated head of the proximal phalanx of the small toe with absent middle phalanx of the small toe, correlate with patient history. Shortened middle phalanx of the fourth toe. No malalignment along the Lisfranc joint. Bony demineralization is present. No acute fracture is identified. Probable mild dorsal subcutaneous edema along the forefoot. IMPRESSION: 1. Truncated distal head of the proximal phalanx small toe in absent middle phalanx, correlate with patient history. I do not see an acute fracture. 2. Shortened middle phalanx of the fourth toe. 3. Bony demineralization. Electronically Signed   By: Van Clines M.D.   On: 11/22/2020 15:06   DG Foot Complete Right  Result Date: 11/22/2020 CLINICAL DATA:  Fall.  Leg swelling EXAM: RIGHT FOOT COMPLETE - 3+ VIEW COMPARISON:  None. FINDINGS: Abnormal appearance of the small toe with absent middle phalanx, small residual ossicle of the distal phalanx, add absent shaft and distal head of the proximal phalanx. Correlate with patient history. Mild hallux valgus. No malalignment at the Lisfranc joint. Bony  demineralization. Small plantar and Achilles calcaneal spurs. Dorsal subcutaneous edema in the forefoot. I do not see an acute fracture. IMPRESSION: 1. Deformities in the small toe as noted above. 2. Bony demineralization. 3. No acute fracture identified. 4. Soft tissue swelling in the dorsum of the foot. 5. Calcaneal spurs. Electronically Signed   By: Van Clines M.D.   On: 11/22/2020 15:08    EKG: Independently reviewed.  NSR with rate 69; nonspecific ST changes with no evidence of acute ischemia   Labs on Admission: I have personally reviewed the available labs and imaging studies at the time of the admission.  Pertinent labs:  Creatinine: 1.23 (1.04-1.72) MCV: 102 UA: +nitrite    Assessment/Plan Principal Problem:   Fall at home, initial encounter -unwitnessed, but son believes she tripped on her pillows -CTH and cervical spine with no acute findings -labs uneventful except UTI -will keep on tele and ordered PT to eval. Son does want her placed and has a Product/process development scientist. Is in process of filling out medicaid paperwork.  -keep on tele overnight -treating UTI in case infection playing a role in this -she is tender on the left lateral side. Thoracic xray limited. If still tender or no improvement in AM, would check CT to rule out compression fracture in AM -continue home tramadol for pain. Pmp website verified and fills correctly.  -PT to eval/treat  -hx of non small cell lung cancer stage 1. No mets on CT and no neurological deficits from baseline.  UTI (urinary tract infection) -urine culture pending -continue rocephin and transition to oral abx.   Abnormal CXR . Indistinct opacity at the right lung apex possibly artifactual given the degree of rightward rotation and upper zone pulmonary vascular prominence. Small amount of airspace opacity in this vicinity is difficult to exclude. This could be from contusion, atelectasis, or pneumonia. Chest CT could be used to further  workup if clinically warranted. -no clinical respiratory findings on exam or history, monitor.     Epigastric pain -lipase pending -complaint before she fell.  -mild on exam, follow clinically   Bilateral symmetrical LE edema -BNP wnl  -will give x 1 dose of 40mg  IV lasix and can resume her oral lasix tomorrow 20mg  every other day, continued home potassium.  -I/O and daily weights -no sign of exacerbation of heart failure. I did order echo since has been since 2019.  Ef was 60-65% with grade 1 diastolic dysfunction.  -? Sedentary vs. Venous stasis.   Non small cell CA of right lung, stage 1 Followed outpatient, stable  Active Problems:   Stage 3 chronic kidney disease (HCC) -stable, monitor.  -did not order her home NSAID, would discuss not to use this drug with her son.     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) Stable, continue aricept and namenda     HTN (hypertension) Very well controlled, continue home medication     Chronic diastolic heart failure, NYHA class 1 (HCC) -grade 1 diastolic dysfunction -I/O and daily weights -bnp wnl, increased edema in legs -echo pending, do not suspect worsening CHF.     Chronic systolic heart failure (HCC) -EF 60/65% on echo in 2019  See above     Macrocytosis without anemia -checking b12 and folate    Tobacco abuse -encourage cessation  Copd -continue home inhalers -stable.  -encourage smoking cessation     Dyslipidemia -continue statin, to goal    Jerrye Bushy -continue PPI   Body mass index is 21.76 kg/m.    Level of care: Med-Surg DVT prophylaxis:  SCDs Code Status: DNR confirmed with family (son)  Family Communication: son present: joseph pratt Disposition Plan:  The patient is from: home  Anticipated d/c is to: home until placement can be made.   Anticipated d/c date will depend on clinical response to treatment, but possibly as early as tomorrow if she has excellent response to  treatment   Consults called: none   Admission status:  observation    Orma Flaming MD [facility]   How to contact the [facility] Attending or Consulting provider [place] or covering provider during after hours [place], for this patient?  Check the care team in [facility] and look for a) attending/consulting [facility] provider listed and b) the [facility] team listed Log into [email or web address] and use [place]'s universal password to access. If you do not have the password, please contact the hospital operator. Locate the [facility] provider you are looking for under [facility] and page to a number that you can be directly reached. If you still have difficulty reaching the provider, please page the [facility] (Director on Call) for the Hospitalists listed on amion for assistance.   11/22/2020, 10:22 PM

## 2020-11-22 NOTE — ED Notes (Signed)
This RN attempted lab draws, no blood return noted x2 sticks. EMT attempting lab draws at this time.

## 2020-11-22 NOTE — Progress Notes (Signed)
Patient arrived to 6N23 from ED. Report received from [place], [place]. Patient drowsy but able to answer questions while also dozing off to sleep mid sentence. Pt oriented to person only. Dual skin assessment with Jewel, RN. See assessment. Stage 2 pressure injury on sacrum, covered with foam dressing. Wound consult ordered. Bed in lowest position. Floor mats placed. Patient call bell within reach. Will continue to monitor.

## 2020-11-22 NOTE — ED Triage Notes (Addendum)
Pt BIB [facility] after fall at home, unwitnessed, heard by son who called 58. Pt reported left hip and shoulder pain, same side as fall. No observation of deformity from EMS. Pt has recent dx of dementia. Son reports SW was to visit today regarding placement in facility.

## 2020-11-22 NOTE — Telephone Encounter (Signed)
  Care Management   Follow Up Note   11/22/2020 Name: Sara Fox MRN: [id number] DOB: 11-24-1940   Referred by: [name], FNP Reason for referral : Chronic Care Management  SW placed a successful outbound call to the patients son to assist with caregiver stress. Sara Fox unable to complete today's call due to patient being transported to the ED for fall.    Follow Up Plan:  Collaboration with [name], primary provider to advise of patient disposition. Rescheduled appointment  [name], BSW, CDP Social Worker, Certified Dementia Practitioner [place] [facility] [phone]

## 2020-11-22 NOTE — ED Provider Notes (Signed)
[facility] EMERGENCY DEPARTMENT Provider Note   CSN: [id number] Arrival date & time: 11/22/20  1142     History Chief Complaint  Patient presents with   Lytle Michaels    Sara Fox is a 80 y.o. female.  80 year old female with history of Alzheimer's dementia brought in by EMS from home.  Patient states that she fell when she got out of bed this morning and hit her side on a nearby desk.  Patient's son lives in the home with her, but heard her fall and was immediately at her side, no reports of loss of consciousness.  Patient reports pain in her right arm as well as mid back and left side.  Son at bedside is also concerned about lower extremity swelling x2 weeks, no history of CHF (per son, review of records shows CHF) as well as concern for possible UTI.      Past Medical History:  Diagnosis Date   Acute kidney injury ([place])    Back pain    Cervical cancer (HCC)    CHF (congestive heart failure) (HCC)    COPD (chronic obstructive pulmonary disease) (HCC)    Coronary artery disease    s/p DES x2 to RCA 2016   GERD (gastroesophageal reflux disease)    History of radiation therapy 07/27/20-08/16/20   Right lung- SBRT- Dr. Gery Pray    Hyperlipidemia    Hypertension    Mild cognitive impairment    Myocardial infarction [facility])    Neck pain    Rhabdomyolysis    Stomach problems     Patient Active Problem List   Diagnosis Date Noted   Late onset Alzheimer's dementia with behavioral disturbance ([place]) 11/18/2020   Visual hallucination 11/18/2020   Sundowning 11/18/2020   Non-small cell carcinoma of right lung, stage 1 ([place]) 06/08/2020   Pulmonary nodule 1 cm or greater in diameter 05/21/2020   Atherosclerotic heart disease of native coronary artery with other forms of angina pectoris ([place]) 04/22/2020   Fatigue 02/18/2019   Memory loss 02/18/2019   Vitamin B12 deficiency 10/23/2018   Hypotension 10/23/2018   Stage 3 chronic kidney disease ([place]) 06/03/2018    Chronic anemia 06/03/2018   Hypercholesterolemia 06/03/2018   Malnutrition of moderate degree 07/12/2017   Hypoalbuminemia 07/11/2017   Elevated LFTs 07/11/2017   Closed fracture of left distal radius 05/28/2017   Snoring 12/21/2016   OSA and COPD overlap syndrome ([place]) 12/21/2016   Peripheral musculoskeletal gait disorder 02/05/2015   Lumbar post-laminectomy syndrome 02/05/2015   CAD in native artery 10/16/2014   NSTEMI (non-ST elevated myocardial infarction) ([place]) 09/13/2014   Cardiomyopathy, ischemic 00/93/8182   Chronic systolic heart failure ([place])    Tobacco abuse 09/12/2014   Dyslipidemia 09/12/2014   Normocytic anemia 09/12/2014   Chronic diastolic heart failure, NYHA class 1 ([place]) 09/12/2014   Postlaminectomy syndrome, cervical region 08/01/2013   Chronic midline low back pain with left-sided sciatica 08/01/2013   Shoulder joint contracture 08/01/2013   HTN (hypertension) 03/03/2013   Abnormal genetic test 03/03/2013    Past Surgical History:  Procedure Laterality Date   ABDOMINAL HYSTERECTOMY     BACK SURGERY  2012   lower back   BRONCHIAL BIOPSY  05/25/2020   Procedure: BRONCHIAL BIOPSIES;  Surgeon: Collene Gobble, MD;  Location: [facility] ENDOSCOPY;  Service: Pulmonary;;   BRONCHIAL BRUSHINGS  05/25/2020   Procedure: BRONCHIAL BRUSHINGS;  Surgeon: Collene Gobble, MD;  Location: [facility] ENDOSCOPY;  Service: Pulmonary;;   BRONCHIAL NEEDLE ASPIRATION BIOPSY  05/25/2020   Procedure: BRONCHIAL NEEDLE ASPIRATION BIOPSIES;  Surgeon: Collene Gobble, MD;  Location: [facility] ENDOSCOPY;  Service: Pulmonary;;   CARDIAC CATHETERIZATION  06/1999   noncritical disease invovling PDA   CARDIAC CATHETERIZATION N/A 09/14/2014   Procedure: Left Heart Cath and Coronary Angiography;  Surgeon: Leonie Man, MD;  Location: [place] INVASIVE CV LAB CUPID;  Service: Cardiovascular;  Laterality: N/A;   CHOLECYSTECTOMY     ESOPHAGOGASTRODUODENOSCOPY (EGD) WITH PROPOFOL Left 07/19/2017   Procedure:  ESOPHAGOGASTRODUODENOSCOPY (EGD) WITH PROPOFOL;  Surgeon: Ronnette Juniper, MD;  Location: [facility] ENDOSCOPY;  Service: Gastroenterology;  Laterality: Left;   FINE NEEDLE ASPIRATION  05/25/2020   Procedure: FINE NEEDLE ASPIRATION (FNA) LINEAR;  Surgeon: Collene Gobble, MD;  Location: [facility] ENDOSCOPY;  Service: Pulmonary;;   FRACTURE SURGERY Left 05/2017   left wrist   HARDWARE REMOVAL Left 11/07/2017   Procedure: LEFT WRIST HARDWARE REMOVAL;  Surgeon: Charlotte Crumb, MD;  Location: [place];  Service: Orthopedics;  Laterality: Left;   KNEE SURGERY Bilateral 2001 & 2007   NECK SURGERY  2012   2012   PARTIAL GASTRECTOMY  2005   subtotal   PERCUTANEOUS CORONARY STENT INTERVENTION (PCI-S)  09/14/2014   Procedure: Percutaneous Coronary Stent Intervention (Pci-S);  Surgeon: Leonie Man, MD;  Location: [facility] INVASIVE CV LAB CUPID;  Service: Cardiovascular;;   SHOULDER SURGERY Right    TRANSTHORACIC ECHOCARDIOGRAM  06/02/2010   EF=>55%, normal LV systolic function; normal RV systolic function; mild mitral annular calcif; trace TR; AV mildly sclerotic   VIDEO BRONCHOSCOPY WITH ENDOBRONCHIAL NAVIGATION N/A 05/25/2020   Procedure: VIDEO BRONCHOSCOPY WITH ENDOBRONCHIAL NAVIGATION;  Surgeon: Collene Gobble, MD;  Location: [place] ENDOSCOPY;  Service: Pulmonary;  Laterality: N/A;   VIDEO BRONCHOSCOPY WITH ENDOBRONCHIAL ULTRASOUND N/A 05/25/2020   Procedure: VIDEO BRONCHOSCOPY WITH ENDOBRONCHIAL ULTRASOUND;  Surgeon: Collene Gobble, MD;  Location: [place] ENDOSCOPY;  Service: Pulmonary;  Laterality: N/A;   WRIST OSTEOTOMY Left 11/07/2017   Procedure: LEFT WRIST DISTAL ULNA RESECTION WITH EXTENSOR CARPI ULNARIS STABILIZATION;  Surgeon: Charlotte Crumb, MD;  Location: [place];  Service: Orthopedics;  Laterality: Left;     OB History   No obstetric history on file.     Family History  Problem Relation Age of Onset   Heart disease Mother        also HTN   Diabetes Mother    Colon cancer Mother    Heart disease Brother         deceased at 8   Hypertension Brother    Heart attack Brother    Heart disease Brother    Hypertension Brother     Social History   Tobacco Use   Smoking status: Every Day    Packs/day: 0.50    Years: 60.00    Pack years: 30.00    Types: Cigarettes   Smokeless tobacco: Never   Tobacco comments:    currently smokes 1/2 ppd or less  Vaping Use   Vaping Use: Former  Substance Use Topics   Alcohol use: No   Drug use: No    Home Medications Prior to Admission medications   Medication Sig Start Date End Date Taking? Authorizing Provider  aspirin EC 81 MG tablet Take 81 mg by mouth daily.    [provider]  Calcium Citrate-Vitamin D (CALCIUM CITRATE+D3 PETITES PO) Take 1 tablet by mouth daily.    [provider]  cholecalciferol (VITAMIN D) 25 MCG (1000 UNIT) tablet Take 1,000 Units by mouth daily.  [provider]  ciclopirox (PENLAC) 8 % solution Apply 1 application topically at bedtime. Apply over nail and surrounding skin. Apply daily over previous coat. After seven (7) days, file nail and continue cycle. 05/25/20   Collene Gobble, MD  [employer] MAGNESIUM OXIDE 250 MG TABS TAKE 1 TABLET BY MOUTH WITH EVENING MEAL DAILY 07/19/20   [name], FNP  donepezil (ARICEPT) 10 MG tablet Take 1 tablet (10 mg total) by mouth at bedtime. 08/03/20   [name], FNP  ferrous sulfate 325 (65 FE) MG tablet Take 325 mg by mouth daily with breakfast.    [provider]  furosemide (LASIX) 20 MG tablet TAKE 1 TABLET BY MOUTH EVERY OTHER DAY 07/26/20   [name], FNP  gabapentin (NEURONTIN) 100 MG capsule Take 1 capsule (100 mg total) by mouth 2 (two) times daily. 09/23/20   [name], FNP  Magnesium 250 MG TABS Take 250 mg by mouth every evening.    [provider]  memantine (NAMENDA) 5 MG tablet Take 1 tablet (5 mg total) by mouth daily. 11/02/20   [name], FNP  metoprolol tartrate (LOPRESSOR) 25 MG tablet TAKE 1 TABLET BY MOUTH TWICE A DAY  06/16/20   Hilty, Nadean Corwin, MD  Multiple Vitamin (MULTIVITAMIN WITH MINERALS) TABS tablet Take 1 tablet by mouth daily.    [provider]  nitroGLYCERIN (NITROSTAT) 0.4 MG SL tablet PLACE ONE TABLET UNDER THE TONGUE EVERY 5 MINUTES AS NEEDED FOR CHEST PAIN. 10/25/20   Hilty, Nadean Corwin, MD  oxybutynin (DITROPAN) 5 MG tablet Take 0.5 tablets (2.5 mg total) by mouth 2 (two) times daily. 11/12/20   [name], FNP  pantoprazole (PROTONIX) 40 MG tablet Take 1 tablet (40 mg total) by mouth 2 (two) times daily. 07/26/20   [name], FNP  polyethylene glycol [facility] / Floria Raveling) packet Take 17 g by mouth daily as needed for mild constipation.    [provider]  Potassium Chloride ER 20 MEQ TBCR TAKE 2 TABLETS BY MOUTH EVERY MORNING Patient taking differently: Take 40 mEq by mouth daily. 01/07/20   Pixie Casino, MD  QUEtiapine (SEROQUEL) 50 MG tablet TAKE 1 TABLET BY MOUTH EVERYDAY AT BEDTIME 11/02/20   [name], FNP  rosuvastatin (CRESTOR) 40 MG tablet TAKE 1 TABLET BY MOUTH ONCE DAILY 11/01/20   Hilty, Nadean Corwin, MD  SYMBICORT 160-4.5 MCG/ACT inhaler TAKE 2 PUFFS BY MOUTH TWICE A DAY 07/26/20   [name], FNP  traMADol (ULTRAM) 50 MG tablet TAKE 1 TABLET 3 TIMES A DAY AND CAN TAKE 1 EXTRA 20 DAYS/MONTH 09/02/20   Charlett Blake, MD    Allergies    Buprenorphine hcl, Morphine and related, Celebrex [celecoxib], Vioxx [rofecoxib], and Codeine  Review of Systems   Review of Systems  Unable to perform ROS: Dementia  Cardiovascular:  Positive for leg swelling.  Musculoskeletal:  Positive for arthralgias and back pain.  All other systems reviewed and are negative.  Physical Exam Updated Vital Signs BP (!) 164/75   Pulse 61   Temp (!) 97 F (36.1 C) (Oral)   Resp 13   SpO2 92%   Physical Exam Vitals and nursing note reviewed.  Constitutional:      General: She is not in acute distress.    Appearance: She is well-developed. She is not diaphoretic.  HENT:      Head: Normocephalic and atraumatic.     Mouth/Throat:     Mouth: Mucous membranes are dry.  Eyes:  Pupils: Pupils are equal, round, and reactive to light.  Cardiovascular:     Rate and Rhythm: Normal rate and regular rhythm.     Heart sounds: Normal heart sounds.  Pulmonary:     Effort: Pulmonary effort is normal.     Breath sounds: Normal breath sounds.  Chest:     Chest wall: Tenderness present.  Abdominal:     Palpations: Abdomen is soft.     Tenderness: There is no abdominal tenderness.  Musculoskeletal:        General: Swelling, tenderness and signs of injury present. No deformity.     Cervical back: Neck supple. No tenderness.     Right lower leg: Edema present.     Left lower leg: Edema present.     Comments: TTP near T10 midline. Minor abrasion to left lower mid axillary ribs. Mild tenderness with palpation of bilateral feet  Pain is with palpation range of motion of the left shoulder and left elbow.  No pain of the left wrist.  No pain to the right arm.  Patient is laying on her right side, no pain with attempted range of motion hips or palpation of pelvis.  Skin:    General: Skin is warm and dry.     Findings: No erythema or rash.  Neurological:     Mental Status: She is alert. Mental status is at baseline.  Psychiatric:        Behavior: Behavior normal.    ED Results / Procedures / Treatments   Labs (all labs ordered are listed, but only abnormal results are displayed) Labs Reviewed  COMPREHENSIVE METABOLIC PANEL - Abnormal; Notable for the following components:      Result Value   BUN 24 (*)    Creatinine, Ser 1.23 (*)    Albumin 3.4 (*)    GFR, Estimated 44 (*)    All other components within normal limits  CBC WITH DIFFERENTIAL/PLATELET - Abnormal; Notable for the following components:   MCV 102.2 (*)    MCHC 29.8 (*)    All other components within normal limits  URINALYSIS, ROUTINE W REFLEX MICROSCOPIC - Abnormal; Notable for the following components:    APPearance HAZY (*)    Nitrite POSITIVE (*)    Bacteria, UA RARE (*)    All other components within normal limits  CULTURE, BLOOD (SINGLE)  URINE CULTURE  RESP PANEL BY RT-PCR (FLU A&B, COVID) ARPGX2  LACTIC ACID, PLASMA  BRAIN NATRIURETIC PEPTIDE    EKG EKG Interpretation  Date/Time:  Monday November 22 2020 12:05:23 EDT Ventricular Rate:  69 PR Interval:  188 QRS Duration: 93 QT Interval:  432 QTC Calculation: 463 R Axis:   44 Text Interpretation: Sinus rhythm Anterior infarct, old ST-t wave abnormality Artifact Abnormal ECG Confirmed by Carmin Muskrat [phone]) on 11/22/2020 1:20:50 PM  Radiology DG Chest 1 View  Result Date: 11/22/2020 CLINICAL DATA:  Fall today EXAM: CHEST  1 VIEW COMPARISON:  CT chest 10/28/2020 and chest radiograph 05/25/2020 FINDINGS: The patient is rotated to the right on today's radiograph, reducing diagnostic sensitivity and specificity. Hazy vascular prominence in the lung apices is indeterminate due to the supine positioning. There is a suggestion of mild interstitial accentuation in the lung apices and potentially some faint airspace opacity at the right lung apex although the rightward rotation makes this a difficult distinction. Atherosclerotic calcification of the aortic arch. Right proximal humeral prosthesis. No pneumothorax. No blunting of the costophrenic angles. Heart size within normal limits. IMPRESSION: 1. Indistinct  opacity at the right lung apex possibly artifactual given the degree of rightward rotation and upper zone pulmonary vascular prominence. Small amount of airspace opacity in this vicinity is difficult to exclude. This could be from contusion, atelectasis, or pneumonia. Chest CT could be used to further workup if clinically warranted. 2.  Atherosclerotic calcification of the aortic arch. Electronically Signed   By: Van Clines M.D.   On: 11/22/2020 15:20   DG Thoracic Spine 2 View  Result Date: 11/22/2020 CLINICAL DATA:  Fall.   Pain. EXAM: THORACIC SPINE 2 VIEWS COMPARISON:  Chest CT 10/28/2020. FINDINGS: Study limited by marked osteopenia. T9 compression fracture is stable since chest CT of 10/28/2020. No other definite thoracic spine compression fracture evident although again, assessment is limited by the demineralization. Frontal film shows no abnormal paraspinal line. Upper thoracic spine is largely obscured on the lateral projection. IMPRESSION: Stable T9 compression fracture. No other definite thoracic spine fracture evident although assessment is limited by demineralization and obscuration of the upper thoracic spine on the lateral projection. If there is high clinical index of concern, CT imaging may be warranted. Electronically Signed   By: Misty Stanley M.D.   On: 11/22/2020 15:05   DG Pelvis 1-2 Views  Result Date: 11/22/2020 CLINICAL DATA:  Fall EXAM: PELVIS - 1-2 VIEW COMPARISON:  CT data from PET-CT dated 06/21/2020 FINDINGS: Posterolateral rod and pedicle screw fixation in the lower lumbar spine and in the S1 level of the sacrum, with the S1 pedicle screws having surrounding lucency which could imply loosening or infection, but not appreciably changed from 06/21/2020. Bony demineralization is present. Mild sclerosis along the SI joints. Mild to moderate axial loss of articular space in both hips. No compelling findings of fracture. IMPRESSION: 1. No pelvic fracture identified. If the patient is unable to bear weight then cross-sectional imaging may be warranted. 2. Lucency around the S1 pedicle screws as on the prior exam from February, potentially from loosening or infection. 3. Bony demineralization. Electronically Signed   By: Van Clines M.D.   On: 11/22/2020 15:17   DG Elbow Complete Left  Result Date: 11/22/2020 CLINICAL DATA:  Fall, elbow pain EXAM: LEFT ELBOW - COMPLETE 3+ VIEW COMPARISON:  Forearm radiographs of 05/27/2017 FINDINGS: Mild spurring along the epicondyles of the distal humerus. No elbow  fracture or malalignment identified. IMPRESSION: 1. No acute bony findings. Electronically Signed   By: Van Clines M.D.   On: 11/22/2020 15:14   CT Head Wo Contrast  Result Date: 11/22/2020 CLINICAL DATA:  Unwitnessed fall.  Dementia. EXAM: CT HEAD WITHOUT CONTRAST TECHNIQUE: Contiguous axial images were obtained from the base of the skull through the vertex without intravenous contrast. COMPARISON:  06/21/2020 MRI brain FINDINGS: Brain: The brainstem, cerebellum, cerebral peduncles, thalami, basal ganglia, basilar cisterns, and ventricular system appear within normal limits. Periventricular white matter and corona radiata hypodensities favor chronic ischemic microvascular white matter disease. No intracranial hemorrhage, mass lesion, or acute CVA. Vascular: There is atherosclerotic calcification of the cavernous carotid arteries bilaterally. Skull: Unremarkable Sinuses/Orbits: Mild chronic left ethmoid sinusitis. Other: Small left pre-auricular subcutaneous lesion, probably a sebaceous cyst or similar benign lesion, not appreciably changed from 06/21/2020. IMPRESSION: 1. No acute intracranial findings. 2. Periventricular white matter and corona radiata hypodensities favor chronic ischemic microvascular white matter disease. 3. Mild chronic left ethmoid sinusitis. Electronically Signed   By: Van Clines M.D.   On: 11/22/2020 15:26   CT Cervical Spine Wo Contrast  Result Date: 11/22/2020 CLINICAL DATA:  Unwitnessed fall with multifocal pain and history of dementia. EXAM: CT CERVICAL SPINE WITHOUT CONTRAST TECHNIQUE: Multidetector CT imaging of the cervical spine was performed without intravenous contrast. Multiplanar CT image reconstructions were also generated. COMPARISON:  06/26/2013 radiographs FINDINGS: Alignment: The C6-7 level is fused with about 2-3 mm of anterior subluxation at the fused level. No acute subluxation identified. Skull base and vertebrae: Erosions along the base of the  odontoid with posterior pannus noted. Erosions along the anterior ring of C1 primarily in the adjacent lateral masses. Sclerosis in the right lateral mass of C1. No acute fracture or acute bony findings identified. Corpectomy at C4 with ACDF at C3-C5. Fused bilateral facet joints at C3-4 and C6-7. Interbody fusion at C6-7. Soft tissues and spinal canal: Atherosclerotic calcification of both common carotid arteries. Disc levels: Intervertebral and facet spurring contribute to osseous foraminal stenosis on the right at C3-4, C4-5, C5-6, and C6-7; and on the left at C2-3, C4-5, C5-6, and C6-7. Upper chest: Posteriorly in the right lung apex we partially include the biopsy-proven non-small cell lung cancer. Other: No supplemental non-categorized findings. IMPRESSION: 1. No acute cervical spine findings. 2. Multilevel postoperative findings with fusion and corpectomy is noted above. Multilevel impingement. 3. Posteriorly in the apical segment right upper lobe we partially include the biopsy-proven non-small cell lung cancer. Electronically Signed   By: Van Clines M.D.   On: 11/22/2020 15:33   DG Shoulder Left  Result Date: 11/22/2020 CLINICAL DATA:  Fall, shoulder pain EXAM: LEFT SHOULDER - 2+ VIEW COMPARISON:  Humerus radiographs from 01/21/2006 FINDINGS: Humeral head is superiorly subluxed with respect to the glenoid but not overtly dislocated. Severe degenerative glenohumeral arthropathy with subcortical sclerosis, spurring, and some flattening of the contour of the glenoid. No discrete fracture identified. Thoracic spondylosis incidentally noted. IMPRESSION: 1. Severe degenerative glenohumeral arthropathy with superior subluxation of the humeral head but no overt dislocation. Electronically Signed   By: Van Clines M.D.   On: 11/22/2020 15:12   DG Foot Complete Left  Result Date: 11/22/2020 CLINICAL DATA:  Fall.  Leg swelling EXAM: LEFT FOOT - COMPLETE 3+ VIEW COMPARISON:  None. FINDINGS:  Truncated head of the proximal phalanx of the small toe with absent middle phalanx of the small toe, correlate with patient history. Shortened middle phalanx of the fourth toe. No malalignment along the Lisfranc joint. Bony demineralization is present. No acute fracture is identified. Probable mild dorsal subcutaneous edema along the forefoot. IMPRESSION: 1. Truncated distal head of the proximal phalanx small toe in absent middle phalanx, correlate with patient history. I do not see an acute fracture. 2. Shortened middle phalanx of the fourth toe. 3. Bony demineralization. Electronically Signed   By: Van Clines M.D.   On: 11/22/2020 15:06   DG Foot Complete Right  Result Date: 11/22/2020 CLINICAL DATA:  Fall.  Leg swelling EXAM: RIGHT FOOT COMPLETE - 3+ VIEW COMPARISON:  None. FINDINGS: Abnormal appearance of the small toe with absent middle phalanx, small residual ossicle of the distal phalanx, add absent shaft and distal head of the proximal phalanx. Correlate with patient history. Mild hallux valgus. No malalignment at the Lisfranc joint. Bony demineralization. Small plantar and Achilles calcaneal spurs. Dorsal subcutaneous edema in the forefoot. I do not see an acute fracture. IMPRESSION: 1. Deformities in the small toe as noted above. 2. Bony demineralization. 3. No acute fracture identified. 4. Soft tissue swelling in the dorsum of the foot. 5. Calcaneal spurs. Electronically Signed   By: Cindra Eves.D.  On: 11/22/2020 15:08    Procedures Procedures   Medications Ordered in ED Medications  acetaminophen (TYLENOL) tablet 650 mg (has no administration in time range)  cefTRIAXone (ROCEPHIN) 1 g in sodium chloride 0.9 % 100 mL IVPB (1 g Intravenous New Bag/Given 11/22/20 1632)  sodium chloride 0.9 % bolus 500 mL (500 mLs Intravenous New Bag/Given 11/22/20 1631)    ED Course  I have reviewed the triage vital signs and the nursing notes.  Pertinent labs & imaging results that were  available during my care of the patient were reviewed by me and considered in my medical decision making (see chart for details).  Clinical Course as of 11/22/20 1721  Mon Nov 23, 6318  1074 80 year old female brought in by EMS from home with son at bedside after fall today.  Patient is not anticoagulated.  Is found to have pain in her mid back, left ribs, feet and left arm.  CT head, C-spine are negative for acute bony injury.  X-ray of the T-spine shows stable T9 compression fracture, there is no upper T-spine pain.  X-ray left arm and feet unremarkable. Son is also concerned about possible UTI, there is a strong odor of urine in the room.  Urinalysis is positive for nitrites, will send for culture and treat with Rocephin. In regards to her lower extremity edema, she does have a history of CHF, son was unaware this was on her chart.  Her BNP is unremarkable at 51, chest x-ray does not show volume overload. Labs otherwise reassuring including normal white blood cell count and normal lactic acid. Vital stable, afebrile on rectal temperature.  O2 96% on room air. Case discussed with Dr. Vanita Panda, ER attending, plan is to consult for admission for UTI with fall in this elderly patient. Consult to transitions of care team, discussed with social worker who has reviewed chart, patient is on a path for memory care placement however son needs to complete Medicaid paperwork before she can be placed. Case discussed with Dr. Eliberto Ivory with [facility] hospitalist service who will consult for admission. [LM]    Clinical Course User Index [LM] Roque Lias   MDM Rules/Calculators/A&P                          Final Clinical Impression(s) / ED Diagnoses Final diagnoses:  Fall, initial encounter  Urinary tract infection in female    Rx / DC Orders ED Discharge Orders     None        Roque Lias 11/22/20 1721    Carmin Muskrat, MD 11/28/20 [phone]

## 2020-11-23 ENCOUNTER — Ambulatory Visit: Payer: Self-pay

## 2020-11-23 ENCOUNTER — Observation Stay (HOSPITAL_COMMUNITY): Payer: Medicare Other

## 2020-11-23 ENCOUNTER — Telehealth: Payer: Medicare Other

## 2020-11-23 DIAGNOSIS — I5022 Chronic systolic (congestive) heart failure: Secondary | ICD-10-CM

## 2020-11-23 DIAGNOSIS — C3491 Malignant neoplasm of unspecified part of right bronchus or lung: Secondary | ICD-10-CM | POA: Diagnosis present

## 2020-11-23 DIAGNOSIS — S22079A Unspecified fracture of T9-T10 vertebra, initial encounter for closed fracture: Secondary | ICD-10-CM | POA: Diagnosis present

## 2020-11-23 DIAGNOSIS — N390 Urinary tract infection, site not specified: Secondary | ICD-10-CM | POA: Diagnosis present

## 2020-11-23 DIAGNOSIS — N183 Chronic kidney disease, stage 3 unspecified: Secondary | ICD-10-CM | POA: Diagnosis present

## 2020-11-23 DIAGNOSIS — R413 Other amnesia: Secondary | ICD-10-CM

## 2020-11-23 DIAGNOSIS — G4733 Obstructive sleep apnea (adult) (pediatric): Secondary | ICD-10-CM | POA: Diagnosis present

## 2020-11-23 DIAGNOSIS — E7800 Pure hypercholesterolemia, unspecified: Secondary | ICD-10-CM | POA: Diagnosis present

## 2020-11-23 DIAGNOSIS — Z955 Presence of coronary angioplasty implant and graft: Secondary | ICD-10-CM | POA: Diagnosis not present

## 2020-11-23 DIAGNOSIS — G301 Alzheimer's disease with late onset: Secondary | ICD-10-CM | POA: Diagnosis present

## 2020-11-23 DIAGNOSIS — W19XXXA Unspecified fall, initial encounter: Secondary | ICD-10-CM | POA: Diagnosis not present

## 2020-11-23 DIAGNOSIS — J449 Chronic obstructive pulmonary disease, unspecified: Secondary | ICD-10-CM | POA: Diagnosis present

## 2020-11-23 DIAGNOSIS — G3183 Dementia with Lewy bodies: Secondary | ICD-10-CM | POA: Diagnosis present

## 2020-11-23 DIAGNOSIS — F0281 Dementia in other diseases classified elsewhere with behavioral disturbance: Secondary | ICD-10-CM | POA: Diagnosis present

## 2020-11-23 DIAGNOSIS — Y92009 Unspecified place in unspecified non-institutional (private) residence as the place of occurrence of the external cause: Secondary | ICD-10-CM | POA: Diagnosis not present

## 2020-11-23 DIAGNOSIS — I252 Old myocardial infarction: Secondary | ICD-10-CM | POA: Diagnosis not present

## 2020-11-23 DIAGNOSIS — R296 Repeated falls: Secondary | ICD-10-CM | POA: Diagnosis not present

## 2020-11-23 DIAGNOSIS — L89152 Pressure ulcer of sacral region, stage 2: Secondary | ICD-10-CM | POA: Diagnosis present

## 2020-11-23 DIAGNOSIS — I5042 Chronic combined systolic (congestive) and diastolic (congestive) heart failure: Secondary | ICD-10-CM | POA: Diagnosis present

## 2020-11-23 DIAGNOSIS — N1831 Chronic kidney disease, stage 3a: Secondary | ICD-10-CM

## 2020-11-23 DIAGNOSIS — I10 Essential (primary) hypertension: Secondary | ICD-10-CM

## 2020-11-23 DIAGNOSIS — G3184 Mild cognitive impairment, so stated: Secondary | ICD-10-CM

## 2020-11-23 DIAGNOSIS — I2510 Atherosclerotic heart disease of native coronary artery without angina pectoris: Secondary | ICD-10-CM | POA: Diagnosis present

## 2020-11-23 DIAGNOSIS — I5032 Chronic diastolic (congestive) heart failure: Secondary | ICD-10-CM | POA: Diagnosis not present

## 2020-11-23 DIAGNOSIS — Z66 Do not resuscitate: Secondary | ICD-10-CM | POA: Diagnosis present

## 2020-11-23 DIAGNOSIS — IMO0001 Reserved for inherently not codable concepts without codable children: Secondary | ICD-10-CM

## 2020-11-23 DIAGNOSIS — Z923 Personal history of irradiation: Secondary | ICD-10-CM | POA: Diagnosis not present

## 2020-11-23 DIAGNOSIS — W102XXA Fall (on)(from) incline, initial encounter: Secondary | ICD-10-CM | POA: Diagnosis present

## 2020-11-23 DIAGNOSIS — N182 Chronic kidney disease, stage 2 (mild): Secondary | ICD-10-CM | POA: Diagnosis not present

## 2020-11-23 DIAGNOSIS — R911 Solitary pulmonary nodule: Secondary | ICD-10-CM

## 2020-11-23 DIAGNOSIS — I25118 Atherosclerotic heart disease of native coronary artery with other forms of angina pectoris: Secondary | ICD-10-CM

## 2020-11-23 DIAGNOSIS — I130 Hypertensive heart and chronic kidney disease with heart failure and stage 1 through stage 4 chronic kidney disease, or unspecified chronic kidney disease: Secondary | ICD-10-CM | POA: Diagnosis present

## 2020-11-23 DIAGNOSIS — Z20822 Contact with and (suspected) exposure to covid-19: Secondary | ICD-10-CM | POA: Diagnosis present

## 2020-11-23 DIAGNOSIS — Z85118 Personal history of other malignant neoplasm of bronchus and lung: Secondary | ICD-10-CM | POA: Diagnosis not present

## 2020-11-23 DIAGNOSIS — K219 Gastro-esophageal reflux disease without esophagitis: Secondary | ICD-10-CM | POA: Diagnosis present

## 2020-11-23 DIAGNOSIS — Z8541 Personal history of malignant neoplasm of cervix uteri: Secondary | ICD-10-CM | POA: Diagnosis not present

## 2020-11-23 DIAGNOSIS — B9620 Unspecified Escherichia coli [E. coli] as the cause of diseases classified elsewhere: Secondary | ICD-10-CM | POA: Diagnosis present

## 2020-11-23 DIAGNOSIS — W010XXA Fall on same level from slipping, tripping and stumbling without subsequent striking against object, initial encounter: Secondary | ICD-10-CM | POA: Diagnosis present

## 2020-11-23 DIAGNOSIS — E785 Hyperlipidemia, unspecified: Secondary | ICD-10-CM | POA: Diagnosis present

## 2020-11-23 LAB — BASIC METABOLIC PANEL
Anion gap: 8 (ref 5–15)
BUN: 25 mg/dL — ABNORMAL HIGH (ref 8–23)
CO2: 24 mmol/L (ref 22–32)
Calcium: 8.9 mg/dL (ref 8.9–10.3)
Chloride: 105 mmol/L (ref 98–111)
Creatinine, Ser: 1.27 mg/dL — ABNORMAL HIGH (ref 0.44–1.00)
GFR, Estimated: 42 mL/min — ABNORMAL LOW (ref >60)
Glucose, Bld: 100 mg/dL — ABNORMAL HIGH (ref 70–99)
Potassium: 3.6 mmol/L (ref 3.5–5.1)
Sodium: 137 mmol/L (ref 135–145)

## 2020-11-23 LAB — CBC
HCT: 39.2 % (ref 36.0–46.0)
Hemoglobin: 11.8 g/dL — ABNORMAL LOW (ref 12.0–15.0)
MCH: 30.1 pg (ref 26.0–34.0)
MCHC: 30.1 g/dL (ref 30.0–36.0)
MCV: 100 fL (ref 80.0–100.0)
Platelets: 307 10*3/uL (ref 150–400)
RBC: 3.92 MIL/uL (ref 3.87–5.11)
RDW: 13.2 % (ref 11.5–15.5)
WBC: 5.9 10*3/uL (ref 4.0–10.5)
nRBC: 0 % (ref 0.0–0.2)

## 2020-11-23 LAB — ECHOCARDIOGRAM COMPLETE
Area-P 1/2: 2.17 cm2
Calc EF: 64.7 %
S' Lateral: 2.2 cm
Single Plane A2C EF: 65.5 %
Single Plane A4C EF: 59.8 %
Weight: 2091.72 oz

## 2020-11-23 LAB — FOLATE: Folate: 8.9 ng/mL (ref >5.9)

## 2020-11-23 LAB — LIPASE, BLOOD: Lipase: 32 U/L (ref 11–51)

## 2020-11-23 LAB — VITAMIN B12: Vitamin B-12: 298 pg/mL (ref 180–914)

## 2020-11-23 MED ORDER — GABAPENTIN 100 MG PO CAPS
100.0000 mg | ORAL_CAPSULE | Freq: Every day | ORAL | Status: DC
  Administered 2020-11-23 – 2020-11-26 (×4): 100 mg via ORAL
  Filled 2020-11-23 (×4): qty 1

## 2020-11-23 MED ORDER — ZINC OXIDE 12.8 % EX OINT
TOPICAL_OINTMENT | Freq: Four times a day (QID) | CUTANEOUS | Status: DC
  Administered 2020-11-25 – 2020-11-26 (×2): 1 via TOPICAL
  Filled 2020-11-23: qty 56.7

## 2020-11-23 MED ORDER — NAPROXEN 250 MG PO TABS
375.0000 mg | ORAL_TABLET | Freq: Two times a day (BID) | ORAL | Status: DC
  Administered 2020-11-23 – 2020-11-24 (×3): 375 mg via ORAL
  Filled 2020-11-23 (×4): qty 2

## 2020-11-23 MED ORDER — FUROSEMIDE 20 MG PO TABS
20.0000 mg | ORAL_TABLET | ORAL | Status: DC
  Administered 2020-11-25 – 2020-11-27 (×2): 20 mg via ORAL
  Filled 2020-11-23 (×2): qty 1

## 2020-11-23 NOTE — Progress Notes (Signed)
PT Cancellation Note  Patient Details Name: Sara Fox MRN: [id number] DOB: 08-Nov-1940   Cancelled Treatment:    Reason Eval/Treat Not Completed: Fatigue/lethargy limiting ability to participate.  Pt is too lethargic to safely attempt to move, retry as time and pt allow.   Ramond Dial 11/23/2020, 9:49 AM  Mee Hives, PT MS Acute Rehab Dept. Number: [place] [facility] [place]

## 2020-11-23 NOTE — Progress Notes (Signed)
  Echocardiogram 2D Echocardiogram has been performed.  Sara Fox 11/23/2020, 9:42 AM

## 2020-11-23 NOTE — Progress Notes (Addendum)
PROGRESS NOTE   Sara Fox  TZG:[id number] DOB: Apr 15, 1941 DOA: 11/22/2020 PCP: [name], FNP  Brief Narrative:  80 year old community dwelling black female Chronic insomnia followed by Dr. Brett Fairy neurology At that office visit recently 11/18/2020 noted multifactorial gait disorder ?  Lung umor--NSCLC R lung stage I COPD?  Still smoker BMI 21 Prior gastrojejunal ulcer--EGD Dr. Acie Fredrickson on hospital admission 2019 no overt bleed  Son was in process of trying to find patient placement in a supervised environment  Presented from home unwitnessed fall-immediate L hip, shoulder pain T-spine x-ray = T9 compression fracture All other imaging negative-was being worked up in outpatient for?  UTI Son concerned regarding R BIL LE edema-BNP however normal    Hospital-Problem based course  Accidental fall in the setting of multiple recent falls T9 compression fracture?  Subacute Therapy to evaluate Able to turn and mobilize in the bed relatively comfortably so do not think T9 fracture needs pain control beyond Naprosyn at this time I have discontinued tramadol given risk of encephalopathy Baseline LO F = ambulates with cane intermittently walker Hfpef-no acute component-diastolic  Cont BB as below--would use lasix sparingly qod 20 Lower extremity pain?  Swelling Unappreciated on my exam Lower extremity duplex negative for DVT Discontinue tramadol-Naprosyn low-dose continue PPI Lung tumor and S LC R lung stage I, still smoker, mild COPD Unlikely will quit-defer to outpatient physicians next steps ?  UTI Unfortunately no culture performed-continue antibiotics De-escalate in 24 hours if white count remains low, afebrile-expected duration 3 days max Alzheimer's?  Lewy body-followed by Dr. Brett Fairy neurology Continue Aricept 10 Namenda 5-Seroquel 50 at bedtime for sleep Caution with use gabapentin, tramadol in this patient-tramadol discontinued this admission continue Neurontin  but changed to at bedtime only HTN  continue metoprolol 25 twice daily  DVT prophylaxis: Lovenox Code Status: Full Family Communication: Granddaughter at the bedside discussed with her Disposition:  Status is: Observation  The patient will require care spanning > 2 midnights and should be moved to inpatient because: Hemodynamically unstable, Unsafe d/c plan, and Inpatient level of care appropriate due to severity of illness  Dispo: The patient is from: Home              Anticipated d/c is to: SNF              Patient currently is not medically stable to d/c.   Difficult to place patient No   Consultants:  n  Procedures: n  Antimicrobials: ceftriax    Subjective: Awake somewhat coherent no distress ROS slightly limited does answer questions however including place time year family  Objective: Vitals:   11/23/20 0042 11/23/20 0421 11/23/20 0801 11/23/20 1603  BP: (!) 153/78 94/62 135/71 129/66  Pulse: 78 70 70 70  Resp: 16 16 17 18   Temp: 97.6 F (36.4 C) 97.6 F (36.4 C) 97.8 F (36.6 C) 97.8 F (36.6 C)  TempSrc: Oral  Oral Oral  SpO2: 98% 96% 93% 100%  Weight:        Intake/Output Summary (Last 24 hours) at 11/23/2020 1708 Last data filed at 11/23/2020 1058 Gross per 24 hour  Intake 599.89 ml  Output 2200 ml  Net -1600.11 ml   Filed Weights   11/22/20 2101  Weight: 59.3 kg    Examination:  Awake cachectic black female Able to verbalize fairly well she is in the hospital understands month year Earlier this morning quite sleepy now more awake CTA B no added sound no rales no rhonchi Abdomen  soft nontender no rebound no HSM Moves 4 limbs equally without gross focal deficit to power-I deferred sensory at this time S1-S2 no murmur Orientation she understands and hospital recognizes granddaughter can relate recent events including fall to me  Data Reviewed: personally reviewed   CBC    Component Value Date/Time   WBC 5.9 11/23/2020 0042   RBC 3.92  11/23/2020 0042   HGB 11.8 (L) 11/23/2020 0042   HGB 10.5 (L) 10/28/2020 1002   HGB 10.9 (L) 12/11/2019 1203   HCT 39.2 11/23/2020 0042   HCT 35.7 12/11/2019 1203   PLT 307 11/23/2020 0042   PLT 221 10/28/2020 1002   PLT 281 12/11/2019 1203   MCV 100.0 11/23/2020 0042   MCV 98 (H) 12/11/2019 1203   MCH 30.1 11/23/2020 0042   MCHC 30.1 11/23/2020 0042   RDW 13.2 11/23/2020 0042   RDW 12.4 12/11/2019 1203   LYMPHSABS 0.8 11/22/2020 1354   LYMPHSABS 1.4 03/11/2019 1252   MONOABS 0.3 11/22/2020 1354   EOSABS 0.0 11/22/2020 1354   EOSABS 0.1 03/11/2019 1252   BASOSABS 0.0 11/22/2020 1354   BASOSABS 0.0 03/11/2019 1252   CMP Latest Ref Rng & Units 11/23/2020 11/22/2020 10/28/2020  Glucose 70 - 99 mg/dL 100(H) 86 97  BUN 8 - 23 mg/dL 25(H) 24(H) 28(H)  Creatinine 0.44 - 1.00 mg/dL 1.27(H) 1.23(H) 1.72(H)  Sodium 135 - 145 mmol/L 137 138 141  Potassium 3.5 - 5.1 mmol/L 3.6 4.6 5.0  Chloride 98 - 111 mmol/L 105 106 111  CO2 22 - 32 mmol/L 24 23 22   Calcium 8.9 - 10.3 mg/dL 8.9 9.5 9.3  Total Protein 6.5 - 8.1 g/dL - 6.8 6.9  Total Bilirubin 0.3 - 1.2 mg/dL - 0.9 0.5  Alkaline Phos 38 - 126 U/L - 103 107  AST 15 - 41 U/L - 22 21  ALT 0 - 44 U/L - 19 15     Radiology Studies: DG Chest 1 View  Result Date: 11/22/2020 CLINICAL DATA:  Fall today EXAM: CHEST  1 VIEW COMPARISON:  CT chest 10/28/2020 and chest radiograph 05/25/2020 FINDINGS: The patient is rotated to the right on today's radiograph, reducing diagnostic sensitivity and specificity. Hazy vascular prominence in the lung apices is indeterminate due to the supine positioning. There is a suggestion of mild interstitial accentuation in the lung apices and potentially some faint airspace opacity at the right lung apex although the rightward rotation makes this a difficult distinction. Atherosclerotic calcification of the aortic arch. Right proximal humeral prosthesis. No pneumothorax. No blunting of the costophrenic angles. Heart size  within normal limits. IMPRESSION: 1. Indistinct opacity at the right lung apex possibly artifactual given the degree of rightward rotation and upper zone pulmonary vascular prominence. Small amount of airspace opacity in this vicinity is difficult to exclude. This could be from contusion, atelectasis, or pneumonia. Chest CT could be used to further workup if clinically warranted. 2.  Atherosclerotic calcification of the aortic arch. Electronically Signed   By: Van Clines M.D.   On: 11/22/2020 15:20   DG Thoracic Spine 2 View  Result Date: 11/22/2020 CLINICAL DATA:  Fall.  Pain. EXAM: THORACIC SPINE 2 VIEWS COMPARISON:  Chest CT 10/28/2020. FINDINGS: Study limited by marked osteopenia. T9 compression fracture is stable since chest CT of 10/28/2020. No other definite thoracic spine compression fracture evident although again, assessment is limited by the demineralization. Frontal film shows no abnormal paraspinal line. Upper thoracic spine is largely obscured on the lateral projection. IMPRESSION: Stable  T9 compression fracture. No other definite thoracic spine fracture evident although assessment is limited by demineralization and obscuration of the upper thoracic spine on the lateral projection. If there is high clinical index of concern, CT imaging may be warranted. Electronically Signed   By: Misty Stanley M.D.   On: 11/22/2020 15:05   DG Pelvis 1-2 Views  Result Date: 11/22/2020 CLINICAL DATA:  Fall EXAM: PELVIS - 1-2 VIEW COMPARISON:  CT data from PET-CT dated 06/21/2020 FINDINGS: Posterolateral rod and pedicle screw fixation in the lower lumbar spine and in the S1 level of the sacrum, with the S1 pedicle screws having surrounding lucency which could imply loosening or infection, but not appreciably changed from 06/21/2020. Bony demineralization is present. Mild sclerosis along the SI joints. Mild to moderate axial loss of articular space in both hips. No compelling findings of fracture.  IMPRESSION: 1. No pelvic fracture identified. If the patient is unable to bear weight then cross-sectional imaging may be warranted. 2. Lucency around the S1 pedicle screws as on the prior exam from February, potentially from loosening or infection. 3. Bony demineralization. Electronically Signed   By: Van Clines M.D.   On: 11/22/2020 15:17   DG Elbow Complete Left  Result Date: 11/22/2020 CLINICAL DATA:  Fall, elbow pain EXAM: LEFT ELBOW - COMPLETE 3+ VIEW COMPARISON:  Forearm radiographs of 05/27/2017 FINDINGS: Mild spurring along the epicondyles of the distal humerus. No elbow fracture or malalignment identified. IMPRESSION: 1. No acute bony findings. Electronically Signed   By: Van Clines M.D.   On: 11/22/2020 15:14   CT Head Wo Contrast  Result Date: 11/22/2020 CLINICAL DATA:  Unwitnessed fall.  Dementia. EXAM: CT HEAD WITHOUT CONTRAST TECHNIQUE: Contiguous axial images were obtained from the base of the skull through the vertex without intravenous contrast. COMPARISON:  06/21/2020 MRI brain FINDINGS: Brain: The brainstem, cerebellum, cerebral peduncles, thalami, basal ganglia, basilar cisterns, and ventricular system appear within normal limits. Periventricular white matter and corona radiata hypodensities favor chronic ischemic microvascular white matter disease. No intracranial hemorrhage, mass lesion, or acute CVA. Vascular: There is atherosclerotic calcification of the cavernous carotid arteries bilaterally. Skull: Unremarkable Sinuses/Orbits: Mild chronic left ethmoid sinusitis. Other: Small left pre-auricular subcutaneous lesion, probably a sebaceous cyst or similar benign lesion, not appreciably changed from 06/21/2020. IMPRESSION: 1. No acute intracranial findings. 2. Periventricular white matter and corona radiata hypodensities favor chronic ischemic microvascular white matter disease. 3. Mild chronic left ethmoid sinusitis. Electronically Signed   By: Van Clines M.D.    On: 11/22/2020 15:26   CT Cervical Spine Wo Contrast  Result Date: 11/22/2020 CLINICAL DATA:  Unwitnessed fall with multifocal pain and history of dementia. EXAM: CT CERVICAL SPINE WITHOUT CONTRAST TECHNIQUE: Multidetector CT imaging of the cervical spine was performed without intravenous contrast. Multiplanar CT image reconstructions were also generated. COMPARISON:  06/26/2013 radiographs FINDINGS: Alignment: The C6-7 level is fused with about 2-3 mm of anterior subluxation at the fused level. No acute subluxation identified. Skull base and vertebrae: Erosions along the base of the odontoid with posterior pannus noted. Erosions along the anterior ring of C1 primarily in the adjacent lateral masses. Sclerosis in the right lateral mass of C1. No acute fracture or acute bony findings identified. Corpectomy at C4 with ACDF at C3-C5. Fused bilateral facet joints at C3-4 and C6-7. Interbody fusion at C6-7. Soft tissues and spinal canal: Atherosclerotic calcification of both common carotid arteries. Disc levels: Intervertebral and facet spurring contribute to osseous foraminal stenosis on the right at  C3-4, C4-5, C5-6, and C6-7; and on the left at C2-3, C4-5, C5-6, and C6-7. Upper chest: Posteriorly in the right lung apex we partially include the biopsy-proven non-small cell lung cancer. Other: No supplemental non-categorized findings. IMPRESSION: 1. No acute cervical spine findings. 2. Multilevel postoperative findings with fusion and corpectomy is noted above. Multilevel impingement. 3. Posteriorly in the apical segment right upper lobe we partially include the biopsy-proven non-small cell lung cancer. Electronically Signed   By: Van Clines M.D.   On: 11/22/2020 15:33   DG Shoulder Left  Result Date: 11/22/2020 CLINICAL DATA:  Fall, shoulder pain EXAM: LEFT SHOULDER - 2+ VIEW COMPARISON:  Humerus radiographs from 01/21/2006 FINDINGS: Humeral head is superiorly subluxed with respect to the glenoid but  not overtly dislocated. Severe degenerative glenohumeral arthropathy with subcortical sclerosis, spurring, and some flattening of the contour of the glenoid. No discrete fracture identified. Thoracic spondylosis incidentally noted. IMPRESSION: 1. Severe degenerative glenohumeral arthropathy with superior subluxation of the humeral head but no overt dislocation. Electronically Signed   By: Van Clines M.D.   On: 11/22/2020 15:12   DG Foot Complete Left  Result Date: 11/22/2020 CLINICAL DATA:  Fall.  Leg swelling EXAM: LEFT FOOT - COMPLETE 3+ VIEW COMPARISON:  None. FINDINGS: Truncated head of the proximal phalanx of the small toe with absent middle phalanx of the small toe, correlate with patient history. Shortened middle phalanx of the fourth toe. No malalignment along the Lisfranc joint. Bony demineralization is present. No acute fracture is identified. Probable mild dorsal subcutaneous edema along the forefoot. IMPRESSION: 1. Truncated distal head of the proximal phalanx small toe in absent middle phalanx, correlate with patient history. I do not see an acute fracture. 2. Shortened middle phalanx of the fourth toe. 3. Bony demineralization. Electronically Signed   By: Van Clines M.D.   On: 11/22/2020 15:06   DG Foot Complete Right  Result Date: 11/22/2020 CLINICAL DATA:  Fall.  Leg swelling EXAM: RIGHT FOOT COMPLETE - 3+ VIEW COMPARISON:  None. FINDINGS: Abnormal appearance of the small toe with absent middle phalanx, small residual ossicle of the distal phalanx, add absent shaft and distal head of the proximal phalanx. Correlate with patient history. Mild hallux valgus. No malalignment at the Lisfranc joint. Bony demineralization. Small plantar and Achilles calcaneal spurs. Dorsal subcutaneous edema in the forefoot. I do not see an acute fracture. IMPRESSION: 1. Deformities in the small toe as noted above. 2. Bony demineralization. 3. No acute fracture identified. 4. Soft tissue swelling in  the dorsum of the foot. 5. Calcaneal spurs. Electronically Signed   By: Van Clines M.D.   On: 11/22/2020 15:08   ECHOCARDIOGRAM COMPLETE  Result Date: 11/23/2020    ECHOCARDIOGRAM REPORT   Patient Name:   Sara Fox Date of Exam: 11/23/2020 Medical Rec #:  [id number]      Height:       65.0 in Accession #:    [id number]     Weight:       130.7 lb Date of Birth:  11/02/1940      BSA:          1.651 m Patient Age:    24 years       BP:           135/71 mmHg Patient Gender: F              HR:           63 bpm. Exam Location:  Inpatient Procedure:  2D Echo, 3D Echo, Cardiac Doppler and Color Doppler Indications:    I50.40* Unspecified combined systolic (congestive) and diastolic                 (congestive) heart failure  History:        Patient has prior history of Echocardiogram examinations, most                 recent 07/12/2017. Cardiomyopathy, CAD and Previous Myocardial                 Infarction, Signs/Symptoms:Altered Mental Status; Risk                 Factors:Hypertension, Dyslipidemia and Former Smoker.  Sonographer:    Roseanna Rainbow RDCS Referring Phys: [id number] [facility]  Sonographer Comments: Technically difficult study due to poor echo windows. IMPRESSIONS  1. Left ventricular ejection fraction, by estimation, is 55 to 60%. Left ventricular ejection fraction by 3D volume is 55 %. The left ventricle has normal function. The left ventricle has no regional wall motion abnormalities. There is mild concentric left ventricular hypertrophy. Left ventricular diastolic parameters are consistent with Grade I diastolic dysfunction (impaired relaxation).  2. Right ventricular systolic function is normal. The right ventricular size is normal. Tricuspid regurgitation signal is inadequate for assessing PA pressure.  3. The mitral valve is degenerative. No evidence of mitral valve regurgitation. No evidence of mitral stenosis. Moderate mitral annular calcification.  4. The aortic valve is tricuspid. Aortic  valve regurgitation is not visualized. Mild to moderate aortic valve sclerosis/calcification is present, without any evidence of aortic stenosis.  5. Aortic dilatation noted. There is borderline dilatation of the ascending aorta, measuring 37 mm.  6. The inferior vena cava is dilated in size with >50% respiratory variability, suggesting right atrial pressure of 8 mmHg. FINDINGS  Left Ventricle: Left ventricular ejection fraction, by estimation, is 55 to 60%. Left ventricular ejection fraction by 3D volume is 55 %. The left ventricle has normal function. The left ventricle has no regional wall motion abnormalities. The left ventricular internal cavity size was normal in size. There is mild concentric left ventricular hypertrophy. Left ventricular diastolic parameters are consistent with Grade I diastolic dysfunction (impaired relaxation). Normal left ventricular filling pressure. Right Ventricle: The right ventricular size is normal. No increase in right ventricular wall thickness. Right ventricular systolic function is normal. Tricuspid regurgitation signal is inadequate for assessing PA pressure. Left Atrium: Left atrial size was normal in size. Right Atrium: Right atrial size was normal in size. Pericardium: There is no evidence of pericardial effusion. Mitral Valve: The mitral valve is degenerative in appearance. There is mild thickening of the mitral valve leaflet(s). There is mild calcification of the mitral valve leaflet(s). Moderate mitral annular calcification. No evidence of mitral valve regurgitation. No evidence of mitral valve stenosis. Tricuspid Valve: The tricuspid valve is normal in structure. Tricuspid valve regurgitation is trivial. No evidence of tricuspid stenosis. Aortic Valve: The aortic valve is tricuspid. Aortic valve regurgitation is not visualized. Mild to moderate aortic valve sclerosis/calcification is present, without any evidence of aortic stenosis. Pulmonic Valve: The pulmonic valve was  normal in structure. Pulmonic valve regurgitation is trivial. No evidence of pulmonic stenosis. Aorta: Aortic dilatation noted. There is borderline dilatation of the ascending aorta, measuring 37 mm. Venous: The inferior vena cava is dilated in size with greater than 50% respiratory variability, suggesting right atrial pressure of 8 mmHg. IAS/Shunts: No atrial level shunt detected by color flow Doppler.  LEFT VENTRICLE PLAX 2D LVIDd:         3.30 cm         Diastology LVIDs:         2.20 cm         LV e' medial:    4.90 cm/s LV PW:         1.30 cm         LV E/e' medial:  12.8 LV IVS:        1.20 cm         LV e' lateral:   6.31 cm/s LVOT diam:     2.00 cm         LV E/e' lateral: 9.9 LV SV:         54 LV SV Index:   33 LVOT Area:     3.14 cm        3D Volume EF                                LV 3D EF:    Left                                             ventricular LV Volumes (MOD)                            ejection LV vol d, MOD    67.8 ml                    fraction by A2C:                                        3D volume LV vol d, MOD    80.6 ml                    is 55 %. A4C: LV vol s, MOD    23.4 ml A2C:                           3D Volume EF: LV vol s, MOD    32.4 ml       3D EF:        55 % A4C: LV SV MOD A2C:   44.4 ml LV SV MOD A4C:   80.6 ml LV SV MOD BP:    50.3 ml RIGHT VENTRICLE            IVC RV S prime:     9.03 cm/s  IVC diam: 2.10 cm TAPSE (M-mode): 2.0 cm LEFT ATRIUM             Index       RIGHT ATRIUM           Index LA diam:        3.30 cm 2.00 cm/m  RA Area:     12.50 cm LA Vol (A2C):   37.8 ml 22.89 ml/m RA Volume:   27.60 ml  16.72 ml/m LA Vol (A4C):   31.4 ml 19.02 ml/m LA Biplane Vol: 33.8 ml 20.47 ml/m  AORTIC VALVE LVOT Vmax:   84.20 cm/s LVOT Vmean:  61.600 cm/s LVOT VTI:    0.171 m  AORTA Ao Root diam: 3.60 cm Ao Asc diam:  3.70 cm MITRAL VALVE MV Area (PHT): 2.17 cm    SHUNTS MV Decel Time: 349 msec    Systemic VTI:  0.17 m MV E velocity: 62.60 cm/s  Systemic Diam: 2.00 cm MV  A velocity: 96.00 cm/s MV E/A ratio:  0.65 Fransico Him MD Electronically signed by Fransico Him MD Signature Date/Time: 11/23/2020/10:21:21 AM    Final      Scheduled Meds:  cholecalciferol  1,000 Units Oral Daily   donepezil  10 mg Oral QHS   enoxaparin (LOVENOX) injection  40 mg Subcutaneous Q24H   ferrous sulfate  325 mg Oral Q breakfast   gabapentin  100 mg Oral BID   magnesium oxide  200 mg Oral Q supper   memantine  5 mg Oral Daily   metoprolol tartrate  25 mg Oral BID   mometasone-formoterol  2 puff Inhalation BID   multivitamin with minerals  1 tablet Oral Daily   naproxen  375 mg Oral BID WC   oxybutynin  2.5 mg Oral BID   pantoprazole  40 mg Oral BID   potassium chloride  40 mEq Oral Daily   QUEtiapine  50 mg Oral QHS   rosuvastatin  40 mg Oral Daily   sodium chloride flush  3 mL Intravenous Q12H   Zinc Oxide   Topical QID   Continuous Infusions:  sodium chloride     cefTRIAXone (ROCEPHIN)  IV 1 g (11/22/20 2039)     LOS: 0 days   Time spent: 59  Nita Sells, MD [facility] To contact the attending provider between 7A-7P or the covering provider during after hours 7P-7A, please log into the web site [email or web address] and access using universal [place] password for that web site. If you do not have the password, please call the hospital operator.  11/23/2020, 5:08 PM

## 2020-11-23 NOTE — Chronic Care Management (AMB) (Signed)
  Chronic Care Management   Inpatient Admit Review Note  11/23/2020 Name: Sara Fox MRN: [id number] DOB: 01-05-1941  Sara Fox is a 80 y.o. year old female who is a primary care patient of [name], [place]. Sara Fox is actively engaged with the embedded care management team in the primary care practice and is being followed by RN Case Manager and BSW for assistance with disease management and care coordination needs related to CHF, HTN, and Dementia.   Sara Fox is currently admitted to the hospital for evaluation and treatment of  Fall and UTI .   Plan: CM team will collaborate with [facility] and will follow patient post discharge.     [name], BSW, CDP Social Worker, Certified Dementia Practitioner [place] [facility] [employer] [facility] [phone]

## 2020-11-23 NOTE — Progress Notes (Signed)
Verbal orders from [facility], MD to d/c tele. Mepilex on sacral ulcer taken off, applying cream from rx 4x/day. Patient lethargic, MD advised to hold tramadol and gabapentin from here on out.

## 2020-11-23 NOTE — Evaluation (Signed)
Physical Therapy Evaluation Patient Details Name: Sara Fox MRN: [id number] DOB: [birth year] Today's Date: 11/23/2020   History of Present Illness  80 yo female with an unwitnessed fall from tripping over pillows (per pt) was brought to hosp and found stable T9 fracture, has UTI and susp PNA, with PMHx:  tobacco use, CHF, HTN, atherosclerosis, thoracic spondylosis, L shoulder OA, cervical spine multilevel fusions, CKF3, non small cell lung CA, Dementia, COPD  Clinical Impression  Pt is lethargic today, but did demonstrate some ability to move even in a limited way.  Her hospitalist is making some medication changes to help her be more alert and interactive.  Her plan is to work on standing and walking as tolerated, and to ask for a SNF stay for recovery.  Her family assisted her prior to this and hopefully are going to give support to her to return with them.  Follow as below for goals of acute PT.    Follow Up Recommendations SNF    Equipment Recommendations  None recommended by PT    Recommendations for Other Services       Precautions / Restrictions Precautions Precautions: Fall;Back Precaution Booklet Issued: No (body mechanics observed due to her history) Precaution Comments: pt is too lethargic at times to do teaching Restrictions Weight Bearing Restrictions: No      Mobility  Bed Mobility Overal bed mobility: Needs Assistance Bed Mobility: Rolling (scooting up in bed) Rolling: Supervision;Mod assist         General bed mobility comments: pt is unable to help at times and then can roll herself    Transfers Overall transfer level: Needs assistance               General transfer comment: too lethargic to attempt  Ambulation/Gait                Stairs            Wheelchair Mobility    Modified Rankin (Stroke Patients Only)       Balance                                             Pertinent Vitals/Pain Pain  Assessment: Faces Faces Pain Scale: Hurts a little bit Pain Location: generally with movement Pain Descriptors / Indicators: Guarding Pain Intervention(s): Limited activity within patient's tolerance;Monitored during session;Repositioned    Home Living Family/patient expects to be discharged to:: Skilled nursing facility                 Additional Comments: requesting SNF, pt cannot give history    Prior Function Level of Independence: Needs assistance   Gait / Transfers Assistance Needed: no history available           Hand Dominance        Extremity/Trunk Assessment   Upper Extremity Assessment Upper Extremity Assessment: Generalized weakness    Lower Extremity Assessment Lower Extremity Assessment: Generalized weakness    Cervical / Trunk Assessment Cervical / Trunk Assessment: Kyphotic  Communication   Communication: Other (comment)  Cognition Arousal/Alertness: Lethargic Behavior During Therapy: Flat affect Overall Cognitive Status: No family/caregiver present to determine baseline cognitive functioning                                 General Comments: family not  present and pt is lethargic      General Comments General comments (skin integrity, edema, etc.): Pt in bed wet, but demonstrates bridging on request and rolling with supervision.  At times she is more dependent but is lethargic today    Exercises     Assessment/Plan    PT Assessment Patient needs continued PT services  PT Problem List Decreased strength;Decreased balance;Decreased cognition;Decreased safety awareness       PT Treatment Interventions DME instruction;Gait training;Functional mobility training;Therapeutic activities;Therapeutic exercise;Balance training;Neuromuscular re-education;Patient/family education    PT Goals (Current goals can be found in the Care Plan section)  Acute Rehab PT Goals Patient Stated Goal: none stated PT Goal Formulation: Patient  unable to participate in goal setting Time For Goal Achievement: 12/07/20 Potential to Achieve Goals: Fair    Frequency Min 3X/week   Barriers to discharge Other (comment) (pt cannot give history of home layout and assistance) unknown    Co-evaluation               AM-PAC PT "6 Clicks" Mobility  Outcome Measure Help needed turning from your back to your side while in a flat bed without using bedrails?: A Lot Help needed moving from lying on your back to sitting on the side of a flat bed without using bedrails?: A Lot Help needed moving to and from a bed to a chair (including a wheelchair)?: Total Help needed standing up from a chair using your arms (e.g., wheelchair or bedside chair)?: Total Help needed to walk in hospital room?: Total Help needed climbing 3-5 steps with a railing? : Total 6 Click Score: 8    End of Session   Activity Tolerance: Patient limited by fatigue;Patient limited by lethargy Patient left: in bed;with call bell/phone within reach;with bed alarm set;with nursing/sitter in room Nurse Communication: Mobility status PT Visit Diagnosis: Muscle weakness (generalized) (M62.81);Pain;Other (comment) Pain - Right/Left:  (generalized)    Time: 1030-1051 PT Time Calculation (min) (ACUTE ONLY): 21 min   Charges:   PT Evaluation $PT Eval Moderate Complexity: 1 Mod         Ramond Dial 11/23/2020, 4:06 PM Mee Hives, PT MS Acute Rehab Dept. Number: [place] [facility] [place]

## 2020-11-23 NOTE — Consult Note (Signed)
WOC Nurse Consult Note: Patient receiving care in [place]. Reason for Consult: "stage 2 on sacrum" Wound type: MASD-ITD intergluteal fissure from incontinence Pressure Injury POA: Yes/No/NA Measurement: na Wound bed: fissure with some small surrounding skin loss. Drainage (amount, consistency, odor) none Periwound: intact, but wet with urine Dressing procedure/placement/frequency: QID application of Triple Paste after cleansing. Avoid the use of sacral foam dressing to present urine and feces trapping under the dressing. Monitor the wound area(s) for worsening of condition such as: Signs/symptoms of infection,  Increase in size,  Development of or worsening of odor, Development of pain, or increased pain at the affected locations.  Notify the medical team if any of these develop.  Thank you for the consult. [place] nurse will not follow at this time.  Please re-consult the [facility] [place] team if needed.  Val Riles, RN, MSN, CWOCN, CNS-BC, pager [phone]

## 2020-11-24 ENCOUNTER — Ambulatory Visit: Payer: Medicare Other

## 2020-11-24 DIAGNOSIS — N1831 Chronic kidney disease, stage 3a: Secondary | ICD-10-CM

## 2020-11-24 DIAGNOSIS — I5022 Chronic systolic (congestive) heart failure: Secondary | ICD-10-CM

## 2020-11-24 DIAGNOSIS — R296 Repeated falls: Secondary | ICD-10-CM | POA: Diagnosis not present

## 2020-11-24 DIAGNOSIS — I10 Essential (primary) hypertension: Secondary | ICD-10-CM

## 2020-11-24 LAB — RENAL FUNCTION PANEL
Albumin: 2.5 g/dL — ABNORMAL LOW (ref 3.5–5.0)
Anion gap: 8 (ref 5–15)
BUN: 26 mg/dL — ABNORMAL HIGH (ref 8–23)
CO2: 23 mmol/L (ref 22–32)
Calcium: 8.4 mg/dL — ABNORMAL LOW (ref 8.9–10.3)
Chloride: 106 mmol/L (ref 98–111)
Creatinine, Ser: 1.37 mg/dL — ABNORMAL HIGH (ref 0.44–1.00)
GFR, Estimated: 39 mL/min — ABNORMAL LOW (ref >60)
Glucose, Bld: 106 mg/dL — ABNORMAL HIGH (ref 70–99)
Phosphorus: 3.4 mg/dL (ref 2.5–4.6)
Potassium: 4.6 mmol/L (ref 3.5–5.1)
Sodium: 137 mmol/L (ref 135–145)

## 2020-11-24 LAB — CBC WITH DIFFERENTIAL/PLATELET
Abs Immature Granulocytes: 0.03 10*3/uL (ref 0.00–0.07)
Basophils Absolute: 0 10*3/uL (ref 0.0–0.1)
Basophils Relative: 0 %
Eosinophils Absolute: 0 10*3/uL (ref 0.0–0.5)
Eosinophils Relative: 1 %
HCT: 31.6 % — ABNORMAL LOW (ref 36.0–46.0)
Hemoglobin: 9.6 g/dL — ABNORMAL LOW (ref 12.0–15.0)
Immature Granulocytes: 0 %
Lymphocytes Relative: 12 %
Lymphs Abs: 0.8 10*3/uL (ref 0.7–4.0)
MCH: 30.3 pg (ref 26.0–34.0)
MCHC: 30.4 g/dL (ref 30.0–36.0)
MCV: 99.7 fL (ref 80.0–100.0)
Monocytes Absolute: 0.8 10*3/uL (ref 0.1–1.0)
Monocytes Relative: 12 %
Neutro Abs: 5.2 10*3/uL (ref 1.7–7.7)
Neutrophils Relative %: 75 %
Platelets: 263 10*3/uL (ref 150–400)
RBC: 3.17 MIL/uL — ABNORMAL LOW (ref 3.87–5.11)
RDW: 13.4 % (ref 11.5–15.5)
WBC: 6.9 10*3/uL (ref 4.0–10.5)
nRBC: 0 % (ref 0.0–0.2)

## 2020-11-24 MED ORDER — SODIUM CHLORIDE 0.9 % IV BOLUS
250.0000 mL | Freq: Once | INTRAVENOUS | Status: AC
  Administered 2020-11-24: 250 mL via INTRAVENOUS

## 2020-11-24 NOTE — Chronic Care Management (AMB) (Signed)
Chronic Care Management    Social Work Note  11/24/2020 Name: Sara Fox MRN: [id number] DOB: 1941/03/21  Sara Fox is a 80 y.o. year old female who is a primary care patient of [name], [place]. The CCM team was consulted to assist the patient with chronic disease management and/or care coordination needs related to: Level of Care Concerns.   Engaged with patient son Sara Fox  for follow up visit in response to provider referral for social work chronic care management and care coordination services.   Consent to Services:  The patient was given information about Chronic Care Management services, agreed to services, and gave verbal consent prior to initiation of services.  Please see initial visit note for detailed documentation.   Patient agreed to services and consent obtained.   Assessment: Review of patient past medical history, allergies, medications, and health status, including review of relevant consultants reports was performed today as part of a comprehensive evaluation and provision of chronic care management and care coordination services.     SDOH (Social Determinants of Health) assessments and interventions performed:    Advanced Directives Status: Not addressed in this encounter.  CCM Care Plan  Allergies  Allergen Reactions   Buprenorphine Hcl Shortness Of Breath    Throat swelling/trouble breathing and lethargic   Morphine And Related Shortness Of Breath    Throat swelling/trouble breathing and lethargic   Celebrex [Celecoxib] Diarrhea and Swelling    Swelling of legs    Vioxx [Rofecoxib] Palpitations   Codeine Other (See Comments)    Bloated     Facility-Administered Encounter Medications as of 11/24/2020  Medication   0.9 %  sodium chloride infusion   acetaminophen (TYLENOL) tablet 650 mg   Or   acetaminophen (TYLENOL) suppository 650 mg   aspirin EC tablet 81 mg   cefTRIAXone (ROCEPHIN) 1 g in sodium chloride 0.9 % 100 mL IVPB    cholecalciferol (VITAMIN D3) tablet 1,000 Units   donepezil (ARICEPT) tablet 10 mg   enoxaparin (LOVENOX) injection 40 mg   ferrous sulfate tablet 325 mg   [START ON 11/25/2020] furosemide (LASIX) tablet 20 mg   gabapentin (NEURONTIN) capsule 100 mg   magnesium oxide (MAG-OX) tablet 200 mg   memantine (NAMENDA) tablet 5 mg   metoprolol tartrate (LOPRESSOR) tablet 25 mg   mometasone-formoterol (DULERA) 200-5 MCG/ACT inhaler 2 puff   multivitamin with minerals tablet 1 tablet   naproxen (NAPROSYN) tablet 375 mg   nitroGLYCERIN (NITROSTAT) SL tablet 0.4 mg   oxybutynin (DITROPAN) tablet 2.5 mg   pantoprazole (PROTONIX) EC tablet 40 mg   potassium chloride (KLOR-CON) CR tablet 40 mEq   QUEtiapine (SEROQUEL) tablet 50 mg   rosuvastatin (CRESTOR) tablet 40 mg   sodium chloride flush (NS) 0.9 % injection 3 mL   sodium chloride flush (NS) 0.9 % injection 3 mL   Zinc Oxide (TRIPLE PASTE) 12.8 % ointment   Outpatient Encounter Medications as of 11/24/2020  Medication Sig   aspirin EC 81 MG tablet Take 81 mg by mouth daily as needed (headache).   Calcium Citrate-Vitamin D (CALCIUM CITRATE+D3 PETITES PO) Take 1 tablet by mouth daily.   cholecalciferol (VITAMIN D) 25 MCG (1000 UNIT) tablet Take 1,000 Units by mouth daily.   ciclopirox (PENLAC) 8 % solution Apply 1 application topically at bedtime. Apply over nail and surrounding skin. Apply daily over previous coat. After seven (7) days, file nail and continue cycle.   [employer] MAGNESIUM OXIDE 250 MG TABS TAKE 1 TABLET  BY MOUTH WITH EVENING MEAL DAILY (Patient taking differently: Take 250 mg by mouth daily with supper.)   donepezil (ARICEPT) 10 MG tablet Take 1 tablet (10 mg total) by mouth at bedtime.   ferrous sulfate 325 (65 FE) MG tablet Take 325 mg by mouth daily with breakfast.   furosemide (LASIX) 20 MG tablet TAKE 1 TABLET BY MOUTH EVERY OTHER DAY (Patient taking differently: Take 20 mg by mouth See admin instructions. Take one tablet (20 mg) by  mouth on Sunday, Tuesday, Thursday mornings)   gabapentin (NEURONTIN) 100 MG capsule Take 1 capsule (100 mg total) by mouth 2 (two) times daily.   ibuprofen (ADVIL) 200 MG tablet Take 400 mg by mouth every 6 (six) hours as needed (pain).   memantine (NAMENDA) 5 MG tablet Take 1 tablet (5 mg total) by mouth daily.   metoprolol tartrate (LOPRESSOR) 25 MG tablet TAKE 1 TABLET BY MOUTH TWICE A DAY (Patient taking differently: Take 25 mg by mouth 2 (two) times daily.)   Multiple Vitamin (MULTIVITAMIN WITH MINERALS) TABS tablet Take 1 tablet by mouth daily.   nitroGLYCERIN (NITROSTAT) 0.4 MG SL tablet PLACE ONE TABLET UNDER THE TONGUE EVERY 5 MINUTES AS NEEDED FOR CHEST PAIN. (Patient taking differently: Place 0.4 mg under the tongue every 5 (five) minutes as needed for chest pain.)   oxybutynin (DITROPAN) 5 MG tablet Take 0.5 tablets (2.5 mg total) by mouth 2 (two) times daily.   pantoprazole (PROTONIX) 40 MG tablet Take 1 tablet (40 mg total) by mouth 2 (two) times daily.   polyethylene glycol (MIRALAX / GLYCOLAX) packet Take 17 g by mouth daily as needed for mild constipation.   Potassium Chloride ER 20 MEQ TBCR TAKE 2 TABLETS BY MOUTH EVERY MORNING (Patient taking differently: Take 40 mEq by mouth daily.)   QUEtiapine (SEROQUEL) 50 MG tablet TAKE 1 TABLET BY MOUTH EVERYDAY AT BEDTIME (Patient taking differently: Take 50 mg by mouth at bedtime.)   rosuvastatin (CRESTOR) 40 MG tablet TAKE 1 TABLET BY MOUTH ONCE DAILY (Patient taking differently: Take 40 mg by mouth daily.)   SYMBICORT 160-4.5 MCG/ACT inhaler TAKE 2 PUFFS BY MOUTH TWICE A DAY (Patient taking differently: Inhale 2 puffs into the lungs 2 (two) times daily.)   traMADol (ULTRAM) 50 MG tablet TAKE 1 TABLET 3 TIMES A DAY AND CAN TAKE 1 EXTRA 20 DAYS/MONTH (Patient taking differently: Take 50-100 mg by mouth See admin instructions. Take one tablet (50 mg) by mouth twice daily - with breakfast and supper; may take two tablets (100 mg) at bedtime as  needed for pain)    Patient Active Problem List   Diagnosis Date Noted   Fall (on)(from) incline, initial encounter 11/23/2020   UTI (urinary tract infection) 11/22/2020   Epigastric pain 11/22/2020   Macrocytosis without anemia 11/22/2020   Unwitnessed fall 11/22/2020   Late onset Alzheimer's dementia with behavioral disturbance ([place]) 11/18/2020   Visual hallucination 11/18/2020   Sundowning 11/18/2020   Non-small cell carcinoma of right lung, stage 1 ([place]) 06/08/2020   Pulmonary nodule 1 cm or greater in diameter 05/21/2020   Atherosclerotic heart disease of native coronary artery with other forms of angina pectoris ([place]) 04/22/2020   Fatigue 02/18/2019   Memory loss 02/18/2019   Vitamin B12 deficiency 10/23/2018   Hypotension 10/23/2018   Stage 3 chronic kidney disease ([place]) 06/03/2018   Chronic anemia 06/03/2018   Hypercholesterolemia 06/03/2018   Malnutrition of moderate degree 07/12/2017   Hypoalbuminemia 07/11/2017   Elevated LFTs 07/11/2017  Closed fracture of left distal radius 05/28/2017   Snoring 12/21/2016   Lower leg edema 12/21/2016   Peripheral musculoskeletal gait disorder 02/05/2015   Lumbar post-laminectomy syndrome 02/05/2015   CAD in native artery 10/16/2014   NSTEMI (non-ST elevated myocardial infarction) ([place]) 09/13/2014   Cardiomyopathy, ischemic 86/76/7209   Chronic systolic heart failure ([place])    Tobacco abuse 09/12/2014   Dyslipidemia 09/12/2014   Normocytic anemia 09/12/2014   Chronic diastolic heart failure, NYHA class 1 ([place]) 09/12/2014   Postlaminectomy syndrome, cervical region 08/01/2013   Chronic midline low back pain with left-sided sciatica 08/01/2013   Shoulder joint contracture 08/01/2013   HTN (hypertension) 03/03/2013   Abnormal genetic test 03/03/2013    Conditions to be addressed/monitored: CHF, HTN, CKD Stage III, and Alzheimer's Disease ; Level of care concerns  Care Plan : Social Work [facility] Care Plan  Updates made by [name] since 11/24/2020 12:00 AM     Problem: Disease Progression      Goal: Disease Progression Managed   Start Date: 11/12/2020  Expected End Date: 01/11/2021  Priority: High  Note:   Current Barriers:  Chronic disease management support and education needs related to CHF, HTN, CKD Stage III, and Mild Cognitive Impairment   Increased Agitation  Social Worker Clinical Goal(s):  patient will work with SW to identify and address any acute and/or chronic care coordination needs related to the self health management of CHF, HTN, CKD Stage III, and Mild Cognitive Impairment   Patient and her son will work with SW to identify long term care options Patient, with the help of her son, will visit the ED to evaluate cause of increased agitation and combativeness Goal not met  SW Interventions:  Inter-disciplinary care team collaboration (see longitudinal plan of care) Collaboration with [name], FNP regarding development and update of comprehensive plan of care as evidenced by provider attestation and co-signature Performed chart review to note patient remains inpatient with discharge recommendations for SNF level rehab Successful outbound call to the patients son Sara Fox who indicates he is getting ready for work but will plan to visit patient prior to work hours Advised [name] that if he feels the patient is doing well in rehab and is happy with the facility he may request her to transition to long term care if a bed is available Discussed SW will be on PAL 7.14-7.22  SW will follow up with Mr Kennon Rounds 7.25 upon return Advised [name] to contact [facility] or the patients primary provider for acute needs Collaboration with RN Care Manager Glenard Haring Little and [name] FNP to advise of patient disposition and plan for discharge to SNF  Patient Goals/Self-Care Activities patient will: with the help of her son  -  Participate in rehab -Work with SW for long term care  placement  Follow Up Plan:  SW will follow up with patients son on 7.25.22       Follow Up Plan: SW will follow up with patient by phone over the next two weeks      [name], BSW, CDP Social Worker, Certified Dementia Practitioner [place] [facility] [phone]

## 2020-11-24 NOTE — Patient Instructions (Signed)
Goals Addressed      Harm or Injury Prevented   On track    Timeframe:  Short-Term Goal Priority:  High Start Date:  11/19/20                           Expected End Date:  12/20/20  Next Scheduled Follow up: 11/23/20      Self Care Activities:  Patient unable to perform Self Care Activities  Patient Goals: - Seek medical attention for evaluation of possible UTI

## 2020-11-24 NOTE — Progress Notes (Signed)
[facility] PROGRESS NOTE    Progress Note  Sara Fox  FYB:[id number] DOB: [birth year] DOA: 11/22/2020 PCP: [name], FNP     Brief Narrative:   Sara Fox is an 80 y.o. 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  Significant studies: 11/22/2020 CT scan of the head and C-spine showed no acute findings. 11/22/2020 chest x-ray showed indistinct opacity of the right lung apex 11/22/2020 hip x-ray showed no identifiable pelvic fractures 11/22/2020 left elbow x-ray showed no acute findings  Patient is awaiting skilled nursing facility placement. Assessment/Plan:   Unwitnessed fall question mechanical: Started empirically on IV Rocephin for possible UTI urine cultures are pending. CT of the chest showed T9 compression fracture subacute. Continue naproxen for pain control. Physical therapy evaluated the patient recommended skilled nursing facility placement. Patient is medically stable for transfer awaiting skilled nursing facility placement.  Chronic systolic and diastolic heart failure: Continue beta-blockers and Lasix.  Euvolemic.  Lower extremity pain and swelling: Lower extremity Doppler was negative. Currently on naproxen low-dose and a PPI.  Stage I non-small cell lung carcinoma: Follow-up with oncology as an outpatient.  Possible UTI: Continue antibiotics for 3 days.  Lewy body dementia: Continue Aricept, Namenda and Seroquel.  Essential hypertension: Continue metoprolol.   Stage II sacral decubitus ulcer present on admission: RN Pressure Injury Documentation: Pressure Injury 07/18/17 Stage II -  Partial thickness loss of dermis presenting as a shallow open ulcer with a red, pink wound bed without  slough. (Active)  07/18/17 0730  Location: Sacrum  Location Orientation:   Staging: Stage II -  Partial thickness loss of dermis presenting as a shallow open ulcer with a red, pink wound bed without slough.  Wound Description (Comments):   Present on Admission: Yes    Estimated body mass index is 21.17 kg/m as calculated from the following:   Height as of 11/18/20: 5\' 5"  (1.651 m).   Weight as of this encounter: 57.7 kg.   DVT prophylaxis: lovenox Family Communication:none Status is: Inpatient  Remains inpatient appropriate because:Hemodynamically unstable  Dispo: The patient is from: Home              Anticipated d/c is to: SNF              Patient currently is not medically stable to d/c.   Difficult to place patient No        Code Status:     Code Status Orders  (From admission, onward)           Start     Ordered   11/22/20 1904  Do not attempt resuscitation (DNR)  Continuous       Question Answer Comment  In the event of cardiac or respiratory ARREST Do not call a "code blue"   In the event of cardiac or respiratory ARREST Do not perform Intubation, CPR, defibrillation or ACLS   In the event of cardiac or respiratory ARREST Use medication by any route, position, wound care, and other measures to relive pain and suffering. May use oxygen, suction and manual treatment of airway obstruction as needed for comfort.      11/22/20 1906           Code Status History     Date Active  Date Inactive Code Status Order ID Comments User Context   07/11/2017 0002 07/20/2017 2327 Full Code [id number]  Norval Morton, MD ED   09/14/2014 1337 09/15/2014 1546 Full Code [id number]  Leonie Man, MD Inpatient   09/12/2014 0849 09/14/2014 1337 Full Code [id number]  [name], NP Inpatient         IV Access:   Peripheral IV   Procedures and diagnostic studies:   DG Chest 1 View  Result Date: 11/22/2020 CLINICAL DATA:  Fall today EXAM: CHEST  1 VIEW COMPARISON:   CT chest 10/28/2020 and chest radiograph 05/25/2020 FINDINGS: The patient is rotated to the right on today's radiograph, reducing diagnostic sensitivity and specificity. Hazy vascular prominence in the lung apices is indeterminate due to the supine positioning. There is a suggestion of mild interstitial accentuation in the lung apices and potentially some faint airspace opacity at the right lung apex although the rightward rotation makes this a difficult distinction. Atherosclerotic calcification of the aortic arch. Right proximal humeral prosthesis. No pneumothorax. No blunting of the costophrenic angles. Heart size within normal limits. IMPRESSION: 1. Indistinct opacity at the right lung apex possibly artifactual given the degree of rightward rotation and upper zone pulmonary vascular prominence. Small amount of airspace opacity in this vicinity is difficult to exclude. This could be from contusion, atelectasis, or pneumonia. Chest CT could be used to further workup if clinically warranted. 2.  Atherosclerotic calcification of the aortic arch. Electronically Signed   By: Van Clines M.D.   On: 11/22/2020 15:20   DG Thoracic Spine 2 View  Result Date: 11/22/2020 CLINICAL DATA:  Fall.  Pain. EXAM: THORACIC SPINE 2 VIEWS COMPARISON:  Chest CT 10/28/2020. FINDINGS: Study limited by marked osteopenia. T9 compression fracture is stable since chest CT of 10/28/2020. No other definite thoracic spine compression fracture evident although again, assessment is limited by the demineralization. Frontal film shows no abnormal paraspinal line. Upper thoracic spine is largely obscured on the lateral projection. IMPRESSION: Stable T9 compression fracture. No other definite thoracic spine fracture evident although assessment is limited by demineralization and obscuration of the upper thoracic spine on the lateral projection. If there is high clinical index of concern, CT imaging may be warranted. Electronically Signed    By: Misty Stanley M.D.   On: 11/22/2020 15:05   DG Pelvis 1-2 Views  Result Date: 11/22/2020 CLINICAL DATA:  Fall EXAM: PELVIS - 1-2 VIEW COMPARISON:  CT data from PET-CT dated 06/21/2020 FINDINGS: Posterolateral rod and pedicle screw fixation in the lower lumbar spine and in the S1 level of the sacrum, with the S1 pedicle screws having surrounding lucency which could imply loosening or infection, but not appreciably changed from 06/21/2020. Bony demineralization is present. Mild sclerosis along the SI joints. Mild to moderate axial loss of articular space in both hips. No compelling findings of fracture. IMPRESSION: 1. No pelvic fracture identified. If the patient is unable to bear weight then cross-sectional imaging may be warranted. 2. Lucency around the S1 pedicle screws as on the prior exam from February, potentially from loosening or infection. 3. Bony demineralization. Electronically Signed   By: Van Clines M.D.   On: 11/22/2020 15:17   DG Elbow Complete Left  Result Date: 11/22/2020 CLINICAL DATA:  Fall, elbow pain EXAM: LEFT ELBOW - COMPLETE 3+ VIEW COMPARISON:  Forearm radiographs of 05/27/2017 FINDINGS: Mild spurring along the epicondyles of the distal humerus. No elbow fracture or malalignment identified. IMPRESSION: 1. No acute bony findings. Electronically Signed  By: Van Clines M.D.   On: 11/22/2020 15:14   CT Head Wo Contrast  Result Date: 11/22/2020 CLINICAL DATA:  Unwitnessed fall.  Dementia. EXAM: CT HEAD WITHOUT CONTRAST TECHNIQUE: Contiguous axial images were obtained from the base of the skull through the vertex without intravenous contrast. COMPARISON:  06/21/2020 MRI brain FINDINGS: Brain: The brainstem, cerebellum, cerebral peduncles, thalami, basal ganglia, basilar cisterns, and ventricular system appear within normal limits. Periventricular white matter and corona radiata hypodensities favor chronic ischemic microvascular white matter disease. No intracranial  hemorrhage, mass lesion, or acute CVA. Vascular: There is atherosclerotic calcification of the cavernous carotid arteries bilaterally. Skull: Unremarkable Sinuses/Orbits: Mild chronic left ethmoid sinusitis. Other: Small left pre-auricular subcutaneous lesion, probably a sebaceous cyst or similar benign lesion, not appreciably changed from 06/21/2020. IMPRESSION: 1. No acute intracranial findings. 2. Periventricular white matter and corona radiata hypodensities favor chronic ischemic microvascular white matter disease. 3. Mild chronic left ethmoid sinusitis. Electronically Signed   By: Van Clines M.D.   On: 11/22/2020 15:26   CT Cervical Spine Wo Contrast  Result Date: 11/22/2020 CLINICAL DATA:  Unwitnessed fall with multifocal pain and history of dementia. EXAM: CT CERVICAL SPINE WITHOUT CONTRAST TECHNIQUE: Multidetector CT imaging of the cervical spine was performed without intravenous contrast. Multiplanar CT image reconstructions were also generated. COMPARISON:  06/26/2013 radiographs FINDINGS: Alignment: The C6-7 level is fused with about 2-3 mm of anterior subluxation at the fused level. No acute subluxation identified. Skull base and vertebrae: Erosions along the base of the odontoid with posterior pannus noted. Erosions along the anterior ring of C1 primarily in the adjacent lateral masses. Sclerosis in the right lateral mass of C1. No acute fracture or acute bony findings identified. Corpectomy at C4 with ACDF at C3-C5. Fused bilateral facet joints at C3-4 and C6-7. Interbody fusion at C6-7. Soft tissues and spinal canal: Atherosclerotic calcification of both common carotid arteries. Disc levels: Intervertebral and facet spurring contribute to osseous foraminal stenosis on the right at C3-4, C4-5, C5-6, and C6-7; and on the left at C2-3, C4-5, C5-6, and C6-7. Upper chest: Posteriorly in the right lung apex we partially include the biopsy-proven non-small cell lung cancer. Other: No supplemental  non-categorized findings. IMPRESSION: 1. No acute cervical spine findings. 2. Multilevel postoperative findings with fusion and corpectomy is noted above. Multilevel impingement. 3. Posteriorly in the apical segment right upper lobe we partially include the biopsy-proven non-small cell lung cancer. Electronically Signed   By: Van Clines M.D.   On: 11/22/2020 15:33   DG Shoulder Left  Result Date: 11/22/2020 CLINICAL DATA:  Fall, shoulder pain EXAM: LEFT SHOULDER - 2+ VIEW COMPARISON:  Humerus radiographs from 01/21/2006 FINDINGS: Humeral head is superiorly subluxed with respect to the glenoid but not overtly dislocated. Severe degenerative glenohumeral arthropathy with subcortical sclerosis, spurring, and some flattening of the contour of the glenoid. No discrete fracture identified. Thoracic spondylosis incidentally noted. IMPRESSION: 1. Severe degenerative glenohumeral arthropathy with superior subluxation of the humeral head but no overt dislocation. Electronically Signed   By: Van Clines M.D.   On: 11/22/2020 15:12   DG Foot Complete Left  Result Date: 11/22/2020 CLINICAL DATA:  Fall.  Leg swelling EXAM: LEFT FOOT - COMPLETE 3+ VIEW COMPARISON:  None. FINDINGS: Truncated head of the proximal phalanx of the small toe with absent middle phalanx of the small toe, correlate with patient history. Shortened middle phalanx of the fourth toe. No malalignment along the Lisfranc joint. Bony demineralization is present. No acute fracture is  identified. Probable mild dorsal subcutaneous edema along the forefoot. IMPRESSION: 1. Truncated distal head of the proximal phalanx small toe in absent middle phalanx, correlate with patient history. I do not see an acute fracture. 2. Shortened middle phalanx of the fourth toe. 3. Bony demineralization. Electronically Signed   By: Van Clines M.D.   On: 11/22/2020 15:06   DG Foot Complete Right  Result Date: 11/22/2020 CLINICAL DATA:  Fall.  Leg  swelling EXAM: RIGHT FOOT COMPLETE - 3+ VIEW COMPARISON:  None. FINDINGS: Abnormal appearance of the small toe with absent middle phalanx, small residual ossicle of the distal phalanx, add absent shaft and distal head of the proximal phalanx. Correlate with patient history. Mild hallux valgus. No malalignment at the Lisfranc joint. Bony demineralization. Small plantar and Achilles calcaneal spurs. Dorsal subcutaneous edema in the forefoot. I do not see an acute fracture. IMPRESSION: 1. Deformities in the small toe as noted above. 2. Bony demineralization. 3. No acute fracture identified. 4. Soft tissue swelling in the dorsum of the foot. 5. Calcaneal spurs. Electronically Signed   By: Van Clines M.D.   On: 11/22/2020 15:08   ECHOCARDIOGRAM COMPLETE  Result Date: 11/23/2020    ECHOCARDIOGRAM REPORT   Patient Name:   Sara Fox Date of Exam: 11/23/2020 Medical Rec #:  [id number]      Height:       65.0 in Accession #:    [id number]     Weight:       130.7 lb Date of Birth:  1941-02-04      BSA:          1.651 m Patient Age:    49 years       BP:           135/71 mmHg Patient Gender: F              HR:           63 bpm. Exam Location:  Inpatient Procedure: 2D Echo, 3D Echo, Cardiac Doppler and Color Doppler Indications:    I50.40* Unspecified combined systolic (congestive) and diastolic                 (congestive) heart failure  History:        Patient has prior history of Echocardiogram examinations, most                 recent 07/12/2017. Cardiomyopathy, CAD and Previous Myocardial                 Infarction, Signs/Symptoms:Altered Mental Status; Risk                 Factors:Hypertension, Dyslipidemia and Former Smoker.  Sonographer:    Roseanna Rainbow RDCS Referring Phys: [id number] [facility]  Sonographer Comments: Technically difficult study due to poor echo windows. IMPRESSIONS  1. Left ventricular ejection fraction, by estimation, is 55 to 60%. Left ventricular ejection fraction by 3D volume is 55 %. The  left ventricle has normal function. The left ventricle has no regional wall motion abnormalities. There is mild concentric left ventricular hypertrophy. Left ventricular diastolic parameters are consistent with Grade I diastolic dysfunction (impaired relaxation).  2. Right ventricular systolic function is normal. The right ventricular size is normal. Tricuspid regurgitation signal is inadequate for assessing PA pressure.  3. The mitral valve is degenerative. No evidence of mitral valve regurgitation. No evidence of mitral stenosis. Moderate mitral annular calcification.  4. The aortic valve is tricuspid. Aortic valve regurgitation  is not visualized. Mild to moderate aortic valve sclerosis/calcification is present, without any evidence of aortic stenosis.  5. Aortic dilatation noted. There is borderline dilatation of the ascending aorta, measuring 37 mm.  6. The inferior vena cava is dilated in size with >50% respiratory variability, suggesting right atrial pressure of 8 mmHg. FINDINGS  Left Ventricle: Left ventricular ejection fraction, by estimation, is 55 to 60%. Left ventricular ejection fraction by 3D volume is 55 %. The left ventricle has normal function. The left ventricle has no regional wall motion abnormalities. The left ventricular internal cavity size was normal in size. There is mild concentric left ventricular hypertrophy. Left ventricular diastolic parameters are consistent with Grade I diastolic dysfunction (impaired relaxation). Normal left ventricular filling pressure. Right Ventricle: The right ventricular size is normal. No increase in right ventricular wall thickness. Right ventricular systolic function is normal. Tricuspid regurgitation signal is inadequate for assessing PA pressure. Left Atrium: Left atrial size was normal in size. Right Atrium: Right atrial size was normal in size. Pericardium: There is no evidence of pericardial effusion. Mitral Valve: The mitral valve is degenerative in  appearance. There is mild thickening of the mitral valve leaflet(s). There is mild calcification of the mitral valve leaflet(s). Moderate mitral annular calcification. No evidence of mitral valve regurgitation. No evidence of mitral valve stenosis. Tricuspid Valve: The tricuspid valve is normal in structure. Tricuspid valve regurgitation is trivial. No evidence of tricuspid stenosis. Aortic Valve: The aortic valve is tricuspid. Aortic valve regurgitation is not visualized. Mild to moderate aortic valve sclerosis/calcification is present, without any evidence of aortic stenosis. Pulmonic Valve: The pulmonic valve was normal in structure. Pulmonic valve regurgitation is trivial. No evidence of pulmonic stenosis. Aorta: Aortic dilatation noted. There is borderline dilatation of the ascending aorta, measuring 37 mm. Venous: The inferior vena cava is dilated in size with greater than 50% respiratory variability, suggesting right atrial pressure of 8 mmHg. IAS/Shunts: No atrial level shunt detected by color flow Doppler.  LEFT VENTRICLE PLAX 2D LVIDd:         3.30 cm         Diastology LVIDs:         2.20 cm         LV e' medial:    4.90 cm/s LV PW:         1.30 cm         LV E/e' medial:  12.8 LV IVS:        1.20 cm         LV e' lateral:   6.31 cm/s LVOT diam:     2.00 cm         LV E/e' lateral: 9.9 LV SV:         54 LV SV Index:   33 LVOT Area:     3.14 cm        3D Volume EF                                LV 3D EF:    Left                                             ventricular LV Volumes (MOD)  ejection LV vol d, MOD    67.8 ml                    fraction by A2C:                                        3D volume LV vol d, MOD    80.6 ml                    is 55 %. A4C: LV vol s, MOD    23.4 ml A2C:                           3D Volume EF: LV vol s, MOD    32.4 ml       3D EF:        55 % A4C: LV SV MOD A2C:   44.4 ml LV SV MOD A4C:   80.6 ml LV SV MOD BP:    50.3 ml RIGHT VENTRICLE             IVC RV S prime:     9.03 cm/s  IVC diam: 2.10 cm TAPSE (M-mode): 2.0 cm LEFT ATRIUM             Index       RIGHT ATRIUM           Index LA diam:        3.30 cm 2.00 cm/m  RA Area:     12.50 cm LA Vol (A2C):   37.8 ml 22.89 ml/m RA Volume:   27.60 ml  16.72 ml/m LA Vol (A4C):   31.4 ml 19.02 ml/m LA Biplane Vol: 33.8 ml 20.47 ml/m  AORTIC VALVE LVOT Vmax:   84.20 cm/s LVOT Vmean:  61.600 cm/s LVOT VTI:    0.171 m  AORTA Ao Root diam: 3.60 cm Ao Asc diam:  3.70 cm MITRAL VALVE MV Area (PHT): 2.17 cm    SHUNTS MV Decel Time: 349 msec    Systemic VTI:  0.17 m MV E velocity: 62.60 cm/s  Systemic Diam: 2.00 cm MV A velocity: 96.00 cm/s MV E/A ratio:  0.65 Fransico Him MD Electronically signed by Fransico Him MD Signature Date/Time: 11/23/2020/10:21:21 AM    Final      Medical Consultants:   None.   Subjective:    Sara Fox no complaints  Objective:    Vitals:   11/24/20 0323 11/24/20 0414 11/24/20 0504 11/24/20 0810  BP: (!) 82/56 (!) 92/56 (!) 97/55 109/75  Pulse: 72 67  61  Resp:  18  17  Temp:  98 F (36.7 C)  (!) 97.5 F (36.4 C)  TempSrc:  Oral    SpO2:  97%  97%  Weight:  57.7 kg     SpO2: 97 %   Intake/Output Summary (Last 24 hours) at 11/24/2020 1002 Last data filed at 11/24/2020 0340 Gross per 24 hour  Intake 451.3 ml  Output 400 ml  Net 51.3 ml   Filed Weights   11/22/20 2101 11/24/20 0414  Weight: 59.3 kg 57.7 kg    Exam: General exam: In no acute distress. Respiratory system: Good air movement and clear to auscultation. Cardiovascular system: S1 & S2 heard, RRR. No JVD. Gastrointestinal system: Abdomen is nondistended, soft and nontender.  Extremities: No pedal edema. Skin: No  rashes, lesions or ulcers  Data Reviewed:    Labs: Basic Metabolic Panel: Recent Labs  Lab 11/22/20 1354 11/23/20 0042 11/24/20 0221  NA 138 137 137  K 4.6 3.6 4.6  CL 106 105 106  CO2 23 24 23   GLUCOSE 86 100* 106*  BUN 24* 25* 26*  CREATININE 1.23* 1.27*  1.37*  CALCIUM 9.5 8.9 8.4*  PHOS  --   --  3.4   GFR Estimated Creatinine Clearance: 29 mL/min (A) (by C-G formula based on SCr of 1.37 mg/dL (H)). Liver Function Tests: Recent Labs  Lab 11/22/20 1354 11/24/20 0221  AST 22  --   ALT 19  --   ALKPHOS 103  --   BILITOT 0.9  --   PROT 6.8  --   ALBUMIN 3.4* 2.5*   Recent Labs  Lab 11/23/20 0042  LIPASE 32   No results for input(s): AMMONIA in the last 168 hours. Coagulation profile No results for input(s): INR, PROTIME in the last 168 hours. COVID-19 Labs  No results for input(s): DDIMER, FERRITIN, LDH, CRP in the last 72 hours.  Lab Results  Component Value Date   SARSCOV2NAA NEGATIVE 11/22/2020   [place] NEGATIVE 05/22/2020    CBC: Recent Labs  Lab 11/22/20 1354 11/22/20 1901 11/23/20 0042 11/24/20 0221  WBC 5.7 5.9 5.9 6.9  NEUTROABS 4.5  --   --  5.2  HGB 12.5 11.4* 11.8* 9.6*  HCT 42.0 38.3 39.2 31.6*  MCV 102.2* 102.1* 100.0 99.7  PLT 257 232 307 263   Cardiac Enzymes: No results for input(s): CKTOTAL, CKMB, CKMBINDEX, TROPONINI in the last 168 hours. BNP (last 3 results) No results for input(s): PROBNP in the last 8760 hours. CBG: No results for input(s): GLUCAP in the last 168 hours. D-Dimer: No results for input(s): DDIMER in the last 72 hours. Hgb A1c: No results for input(s): HGBA1C in the last 72 hours. Lipid Profile: No results for input(s): CHOL, HDL, LDLCALC, TRIG, CHOLHDL, LDLDIRECT in the last 72 hours. Thyroid function studies: Recent Labs    11/22/20 1905  TSH 0.918   Anemia work up: Recent Labs    11/23/20 0042  VITAMINB12 298  FOLATE 8.9   Sepsis Labs: Recent Labs  Lab 11/22/20 1354 11/22/20 1901 11/23/20 0042 11/24/20 0221  WBC 5.7 5.9 5.9 6.9  LATICACIDVEN 1.7  --   --   --    Microbiology Recent Results (from the past 240 hour(s))  Culture, blood (single)     Status: None (Preliminary result)   Collection Time: 11/22/20  1:54 PM   Specimen: BLOOD   Result Value Ref Range Status   Specimen Description BLOOD SITE NOT SPECIFIED  Final   Special Requests   Final    BOTTLES DRAWN AEROBIC AND ANAEROBIC Blood Culture results may not be optimal due to an inadequate volume of blood received in culture bottles   Culture   Final    NO GROWTH 2 DAYS Performed at [facility], [place] [address], [place], [place] [address]    Report Status PENDING  Incomplete  Resp Panel by RT-PCR (Flu A&B, Covid) Nasopharyngeal Swab     Status: None   Collection Time: 11/22/20  4:58 PM   Specimen: Nasopharyngeal Swab; Nasopharyngeal(NP) swabs in vial transport medium  Result Value Ref Range Status   SARS Coronavirus 2 by RT PCR NEGATIVE NEGATIVE Final    Comment: (NOTE) SARS-CoV-2 target nucleic acids are NOT DETECTED.  The SARS-CoV-2 RNA is generally detectable in  upper respiratory specimens during the acute phase of infection. The lowest concentration of SARS-CoV-2 viral copies this assay can detect is 138 copies/mL. A negative result does not preclude SARS-Cov-2 infection and should not be used as the sole basis for treatment or other patient management decisions. A negative result may occur with  improper specimen collection/handling, submission of specimen other than nasopharyngeal swab, presence of viral mutation(s) within the areas targeted by this assay, and inadequate number of viral copies(<138 copies/mL). A negative result must be combined with clinical observations, patient history, and epidemiological information. The expected result is Negative.  Fact Sheet for Patients:  EntrepreneurPulse.com.au  Fact Sheet for Healthcare Providers:  IncredibleEmployment.be  This test is no t yet approved or cleared by the [place] FDA and  has been authorized for detection and/or diagnosis of SARS-CoV-2 by FDA under an Emergency Use Authorization (EUA). This EUA will remain  in effect (meaning this test can  be used) for the duration of the COVID-19 declaration under Section 564(b)(1) of the Act, 21 U.S.C.section 360bbb-3(b)(1), unless the authorization is terminated  or revoked sooner.       Influenza A by PCR NEGATIVE NEGATIVE Final   Influenza B by PCR NEGATIVE NEGATIVE Final    Comment: (NOTE) The Xpert Xpress SARS-CoV-2/FLU/RSV plus assay is intended as an aid in the diagnosis of influenza from Nasopharyngeal swab specimens and should not be used as a sole basis for treatment. Nasal washings and aspirates are unacceptable for Xpert Xpress SARS-CoV-2/FLU/RSV testing.  Fact Sheet for Patients: EntrepreneurPulse.com.au  Fact Sheet for Healthcare Providers: IncredibleEmployment.be  This test is not yet approved or cleared by the [place] FDA and has been authorized for detection and/or diagnosis of SARS-CoV-2 by FDA under an Emergency Use Authorization (EUA). This EUA will remain in effect (meaning this test can be used) for the duration of the COVID-19 declaration under Section 564(b)(1) of the Act, 21 U.S.C. section 360bbb-3(b)(1), unless the authorization is terminated or revoked.  Performed at [facility], [place] [address], [place], [place] [address]      Medications:    cholecalciferol  1,000 Units Oral Daily   donepezil  10 mg Oral QHS   enoxaparin (LOVENOX) injection  40 mg Subcutaneous Q24H   ferrous sulfate  325 mg Oral Q breakfast   [START ON 11/25/2020] furosemide  20 mg Oral QODAY   gabapentin  100 mg Oral QHS   magnesium oxide  200 mg Oral Q supper   memantine  5 mg Oral Daily   metoprolol tartrate  25 mg Oral BID   mometasone-formoterol  2 puff Inhalation BID   multivitamin with minerals  1 tablet Oral Daily   naproxen  375 mg Oral BID WC   oxybutynin  2.5 mg Oral BID   pantoprazole  40 mg Oral BID   potassium chloride  40 mEq Oral Daily   QUEtiapine  50 mg Oral QHS   rosuvastatin  40 mg Oral Daily   sodium  chloride flush  3 mL Intravenous Q12H   Zinc Oxide   Topical QID   Continuous Infusions:  sodium chloride     cefTRIAXone (ROCEPHIN)  IV 1 g (11/23/20 2033)      LOS: 1 day   Sara Fox  [facility]  11/24/2020, 10:02 AM

## 2020-11-24 NOTE — Patient Instructions (Signed)
Social Worker Visit Information  Goals we discussed today:   Goals Addressed             This Visit's Progress    Disease Progression Managed       Timeframe:  Short-Term Goal Priority:  High Start Date:  7.1.22                           Expected End Date: 8.30.22                       Patient Goals/Self-Care Activities patient will: with the help of her son  - Participate in rehab -Work with SW for long term care placement           Follow Up Plan: SW will follow up with patient by phone over the next two weeks   [name], BSW, CDP Social Worker, Certified Dementia Practitioner [place] [facility] [place] [facility] [phone]

## 2020-11-24 NOTE — TOC Initial Note (Signed)
Transition of Care [facility]) - Initial/Assessment Note    Patient Details  Name: Sara Fox MRN: [id number] Date of Birth: 1940/07/03  Transition of Care [facility]) CM/SW Contact:    Emeterio Reeve, [place] Phone Number: 11/24/2020, 12:03 PM  Clinical Narrative:                  CSW met with pt and grand daughter Delana Meyer at bedside on 7/12.  CSW introduced self and explained her role at the hospital.  Pt appeared to be in pain and deferred questions to her grand daughter.   Jasmine stated that pt lives at home with her son. Jasmine stated that PTA pt could walk with supervision. Pt does have a walker and cane that she uses outside of the house. Pt is able to preform ADL's on her own. Pt does require set up and supervision when bathing and dressing.   CSW reviewed PT.OT reccs with pt and grand daughter for SNF. Both agree that SNF is needed. They gave CSW permission to fax out to facilities in the area. They have no preference at this time. Pt is covid vaccinate. [place] daughter is unsure about booster. Jasmine requested snf offers be sent to her email at [email or web address]. Pt stated updates can be given to her sons and [place]..  Expected Discharge Plan: Skilled Nursing Facility Barriers to Discharge: Insurance Authorization, Continued Medical Work up   Patient Goals and CMS Choice Patient states their goals for this hospitalization and ongoing recovery are:: to get stronger CMS Medicare.gov Compare Post Acute Care list provided to:: Patient Choice offered to / list presented to : Patient  Expected Discharge Plan and Services Expected Discharge Plan: [place]       Living arrangements for the past 2 months: Single Family Home                                      Prior Living Arrangements/Services Living arrangements for the past 2 months: Single Family Home Lives with:: Adult Children Patient language and need for interpreter reviewed:: Yes Do you feel  safe going back to the place where you live?: Yes      Need for Family Participation in Patient Care: Yes (Comment) Care giver support system in place?: Yes (comment) Current home services: DME    Activities of Daily Living      Permission Sought/Granted Permission sought to share information with : Family Supports Permission granted to share information with : Yes, Verbal Permission Granted     Permission granted to share info w AGENCY: SNF  Permission granted to share info w Relationship: [place] daughter and sons     Emotional Assessment Appearance:: Appears stated age Attitude/Demeanor/Rapport: Engaged Affect (typically observed): Appropriate Orientation: : Oriented to Self, Oriented to Place, Oriented to  Time, Oriented to Situation Alcohol / Substance Use: Not Applicable Psych Involvement: No (comment)  Admission diagnosis:  UTI (urinary tract infection) [N39.0] Fall [W19.XXXA] Fall, initial encounter [id number]] Urinary tract infection in female [N39.0] Unwitnessed fall [R29.6] Fall (on)(from) incline, initial encounter [W10.2XXA] Patient Active Problem List   Diagnosis Date Noted   Fall (on)(from) incline, initial encounter 11/23/2020   UTI (urinary tract infection) 11/22/2020   Epigastric pain 11/22/2020   Macrocytosis without anemia 11/22/2020   Unwitnessed fall 11/22/2020   Late onset Alzheimer's dementia with behavioral disturbance ([place]) 11/18/2020   Visual hallucination 11/18/2020   Sundowning 11/18/2020  Non-small cell carcinoma of right lung, stage 1 ([place]) 06/08/2020   Pulmonary nodule 1 cm or greater in diameter 05/21/2020   Atherosclerotic heart disease of native coronary artery with other forms of angina pectoris ([place]) 04/22/2020   Fatigue 02/18/2019   Memory loss 02/18/2019   Vitamin B12 deficiency 10/23/2018   Hypotension 10/23/2018   Stage 3 chronic kidney disease ([place]) 06/03/2018   Chronic anemia 06/03/2018   Hypercholesterolemia 06/03/2018    Malnutrition of moderate degree 07/12/2017   Hypoalbuminemia 07/11/2017   Elevated LFTs 07/11/2017   Closed fracture of left distal radius 05/28/2017   Snoring 12/21/2016   Lower leg edema 12/21/2016   Peripheral musculoskeletal gait disorder 02/05/2015   Lumbar post-laminectomy syndrome 02/05/2015   CAD in native artery 10/16/2014   NSTEMI (non-ST elevated myocardial infarction) ([place]) 09/13/2014   Cardiomyopathy, ischemic 50/07/7046   Chronic systolic heart failure ([place])    Tobacco abuse 09/12/2014   Dyslipidemia 09/12/2014   Normocytic anemia 09/12/2014   Chronic diastolic heart failure, NYHA class 1 ([place]) 09/12/2014   Postlaminectomy syndrome, cervical region 08/01/2013   Chronic midline low back pain with left-sided sciatica 08/01/2013   Shoulder joint contracture 08/01/2013   HTN (hypertension) 03/03/2013   Abnormal genetic test 03/03/2013   PCP:  [name], FNP Pharmacy:   [id number] [place], [place] [address] [place] [address] Phone: [phone] Fax: [phone]     Social Determinants of Health (SDOH) Interventions    Readmission Risk Interventions No flowsheet data found.  Emeterio Reeve, Latanya Presser, [place] Social Worker [phone]

## 2020-11-24 NOTE — NC FL2 (Signed)
[place] MEDICAID FL2 LEVEL OF CARE SCREENING TOOL     IDENTIFICATION  Patient Name: Sara Fox Birthdate: Dec 24, 1940 Sex: female Admission Date (Current Location): 11/22/2020  [facility] and [place] Number:  Herbalist and Address:  [facility], [place] [address] [place], [place] [address]      Provider Number: [id number]  Attending Physician Name and Address:  Charlynne Cousins, MD  Relative Name and Phone Number:       Current Level of Care: Hospital Recommended Level of Care: [place] Prior Approval Number:    Date Approved/Denied:   PASRR Number: [id number]  Discharge Plan: SNF    Current Diagnoses: Patient Active Problem List   Diagnosis Date Noted   Fall (on)(from) incline, initial encounter 11/23/2020   UTI (urinary tract infection) 11/22/2020   Epigastric pain 11/22/2020   Macrocytosis without anemia 11/22/2020   Unwitnessed fall 11/22/2020   Late onset Alzheimer's dementia with behavioral disturbance ([place]) 11/18/2020   Visual hallucination 11/18/2020   Sundowning 11/18/2020   Non-small cell carcinoma of right lung, stage 1 ([place]) 06/08/2020   Pulmonary nodule 1 cm or greater in diameter 05/21/2020   Atherosclerotic heart disease of native coronary artery with other forms of angina pectoris ([place]) 04/22/2020   Fatigue 02/18/2019   Memory loss 02/18/2019   Vitamin B12 deficiency 10/23/2018   Hypotension 10/23/2018   Stage 3 chronic kidney disease ([place]) 06/03/2018   Chronic anemia 06/03/2018   Hypercholesterolemia 06/03/2018   Malnutrition of moderate degree 07/12/2017   Hypoalbuminemia 07/11/2017   Elevated LFTs 07/11/2017   Closed fracture of left distal radius 05/28/2017   Snoring 12/21/2016   Lower leg edema 12/21/2016   Peripheral musculoskeletal gait disorder 02/05/2015   Lumbar post-laminectomy syndrome 02/05/2015   CAD in native artery 10/16/2014   NSTEMI (non-ST elevated myocardial  infarction) ([place]) 09/13/2014   Cardiomyopathy, ischemic 81/05/7508   Chronic systolic heart failure ([place])    Tobacco abuse 09/12/2014   Dyslipidemia 09/12/2014   Normocytic anemia 09/12/2014   Chronic diastolic heart failure, NYHA class 1 ([place]) 09/12/2014   Postlaminectomy syndrome, cervical region 08/01/2013   Chronic midline low back pain with left-sided sciatica 08/01/2013   Shoulder joint contracture 08/01/2013   HTN (hypertension) 03/03/2013   Abnormal genetic test 03/03/2013    Orientation RESPIRATION BLADDER Height & Weight     Self, Time, Situation, Place  Normal Continent Weight: 127 lb 3.3 oz (57.7 kg) Height:     BEHAVIORAL SYMPTOMS/MOOD NEUROLOGICAL BOWEL NUTRITION STATUS      Continent Diet (See DC summary)  AMBULATORY STATUS COMMUNICATION OF NEEDS Skin   Extensive Assist Verbally Normal                       Personal Care Assistance Level of Assistance  Bathing, Feeding, Dressing Bathing Assistance: Maximum assistance Feeding assistance: Limited assistance Dressing Assistance: Maximum assistance     Functional Limitations Info             SPECIAL CARE FACTORS FREQUENCY  PT (By licensed PT), OT (By licensed OT)     PT Frequency: 5x a week OT Frequency: 5x a week            Contractures Contractures Info: Not present    Additional Factors Info  Code Status, Allergies Code Status Info: DNR Allergies Info: Buprenorphine Hcl   Morphine And Related   Celebrex (Celecoxib)   Vioxx (Rofecoxib)   Codeine  Current Medications (11/24/2020):  This is the current hospital active medication list Current Facility-Administered Medications  Medication Dose Route Frequency Provider Last Rate Last Admin   0.9 %  sodium chloride infusion  250 mL Intravenous PRN Orma Flaming, MD       acetaminophen (TYLENOL) tablet 650 mg  650 mg Oral Q6H PRN Orma Flaming, MD       Or   acetaminophen (TYLENOL) suppository 650 mg  650 mg Rectal Q6H PRN Orma Flaming, MD       aspirin EC tablet 81 mg  81 mg Oral Daily PRN Orma Flaming, MD       cefTRIAXone (ROCEPHIN) 1 g in sodium chloride 0.9 % 100 mL IVPB  1 g Intravenous Q24H Orma Flaming, MD 200 mL/hr at 11/23/20 2033 1 g at 11/23/20 2033   cholecalciferol (VITAMIN D3) tablet 1,000 Units  1,000 Units Oral Daily Orma Flaming, MD   1,000 Units at 11/24/20 1003   donepezil (ARICEPT) tablet 10 mg  10 mg Oral QHS Orma Flaming, MD   10 mg at 11/23/20 2034   enoxaparin (LOVENOX) injection 40 mg  40 mg Subcutaneous Q24H Orma Flaming, MD   40 mg at 11/23/20 2034   ferrous sulfate tablet 325 mg  325 mg Oral Q breakfast Orma Flaming, MD   325 mg at 11/24/20 1008   [START ON 11/25/2020] furosemide (LASIX) tablet 20 mg  20 mg Oral Santiago Bur, MD       gabapentin (NEURONTIN) capsule 100 mg  100 mg Oral QHS Nita Sells, MD   100 mg at 11/23/20 2033   magnesium oxide (MAG-OX) tablet 200 mg  200 mg Oral Q supper Orma Flaming, MD   200 mg at 11/23/20 1658   memantine (NAMENDA) tablet 5 mg  5 mg Oral Daily Orma Flaming, MD   5 mg at 11/24/20 1007   metoprolol tartrate (LOPRESSOR) tablet 25 mg  25 mg Oral BID Orma Flaming, MD   25 mg at 11/24/20 1014   mometasone-formoterol (DULERA) 200-5 MCG/ACT inhaler 2 puff  2 puff Inhalation BID Orma Flaming, MD   2 puff at 11/23/20 2017   multivitamin with minerals tablet 1 tablet  1 tablet Oral Daily Orma Flaming, MD   1 tablet at 11/24/20 1009   naproxen (NAPROSYN) tablet 375 mg  375 mg Oral BID WC Nita Sells, MD   375 mg at 11/24/20 1007   nitroGLYCERIN (NITROSTAT) SL tablet 0.4 mg  0.4 mg Sublingual Q5 min PRN Orma Flaming, MD       oxybutynin [facility]) tablet 2.5 mg  2.5 mg Oral BID Orma Flaming, MD   2.5 mg at 11/24/20 1008   pantoprazole (PROTONIX) EC tablet 40 mg  40 mg Oral BID Orma Flaming, MD   40 mg at 11/24/20 1008   potassium chloride (KLOR-CON) CR tablet 40 mEq  40 mEq Oral Daily Orma Flaming, MD   40  mEq at 11/24/20 1004   QUEtiapine (SEROQUEL) tablet 50 mg  50 mg Oral QHS Orma Flaming, MD   50 mg at 11/23/20 2033   rosuvastatin (CRESTOR) tablet 40 mg  40 mg Oral Daily Orma Flaming, MD   40 mg at 11/24/20 1011   sodium chloride flush (NS) 0.9 % injection 3 mL  3 mL Intravenous Q12H Orma Flaming, MD   3 mL at 11/24/20 1010   sodium chloride flush (NS) 0.9 % injection 3 mL  3 mL Intravenous PRN Orma Flaming, MD  Zinc Oxide (TRIPLE PASTE) 12.8 % ointment   Topical QID Nita Sells, MD   Given at 11/24/20 1010     Discharge Medications: Please see discharge summary for a list of discharge medications.  Relevant Imaging Results:  Relevant Lab Results:   Additional Information ssn: [id number], covid vaccinaed, no booster  Emeterio Reeve, LCSWA

## 2020-11-24 NOTE — Chronic Care Management (AMB) (Signed)
Chronic Care Management   CCM RN Visit Note  11/19/2020 Name: CHERRISH VITALI MRN: [id number] DOB: 1940-09-18  Subjective: Sara Fox is a 80 y.o. year old female who is a primary care patient of [name], [place]. The care management team was consulted for assistance with disease management and care coordination needs.    Engaged with patient by telephone for follow up visit in response to provider referral for case management and/or care coordination services.   Consent to Services:  The patient was given information about Chronic Care Management services, agreed to services, and gave verbal consent prior to initiation of services.  Please see initial visit note for detailed documentation.   Patient agreed to services and verbal consent obtained.   Assessment: Review of patient past medical history, allergies, medications, health status, including review of consultants reports, laboratory and other test data, was performed as part of comprehensive evaluation and provision of chronic care management services.   SDOH (Social Determinants of Health) assessments and interventions performed:    CCM Care Plan  Allergies  Allergen Reactions   Buprenorphine Hcl Shortness Of Breath    Throat swelling/trouble breathing and lethargic   Morphine And Related Shortness Of Breath    Throat swelling/trouble breathing and lethargic   Celebrex [Celecoxib] Diarrhea and Swelling    Swelling of legs    Vioxx [Rofecoxib] Palpitations   Codeine Other (See Comments)    Bloated     No facility-administered encounter medications on file as of 11/19/2020.   Outpatient Encounter Medications as of 11/19/2020  Medication Sig   aspirin EC 81 MG tablet Take 81 mg by mouth daily as needed (headache).   Calcium Citrate-Vitamin D (CALCIUM CITRATE+D3 PETITES PO) Take 1 tablet by mouth daily.   cholecalciferol (VITAMIN D) 25 MCG (1000 UNIT) tablet Take 1,000 Units by mouth daily.   ciclopirox (PENLAC) 8 %  solution Apply 1 application topically at bedtime. Apply over nail and surrounding skin. Apply daily over previous coat. After seven (7) days, file nail and continue cycle.   [facility] MAGNESIUM OXIDE 250 MG TABS TAKE 1 TABLET BY MOUTH WITH EVENING MEAL DAILY (Patient taking differently: Take 250 mg by mouth daily with supper.)   donepezil (ARICEPT) 10 MG tablet Take 1 tablet (10 mg total) by mouth at bedtime.   ferrous sulfate 325 (65 FE) MG tablet Take 325 mg by mouth daily with breakfast.   furosemide (LASIX) 20 MG tablet TAKE 1 TABLET BY MOUTH EVERY OTHER DAY (Patient taking differently: Take 20 mg by mouth See admin instructions. Take one tablet (20 mg) by mouth on Sunday, Tuesday, Thursday mornings)   gabapentin (NEURONTIN) 100 MG capsule Take 1 capsule (100 mg total) by mouth 2 (two) times daily.   memantine (NAMENDA) 5 MG tablet Take 1 tablet (5 mg total) by mouth daily.   metoprolol tartrate (LOPRESSOR) 25 MG tablet TAKE 1 TABLET BY MOUTH TWICE A DAY (Patient taking differently: Take 25 mg by mouth 2 (two) times daily.)   Multiple Vitamin (MULTIVITAMIN WITH MINERALS) TABS tablet Take 1 tablet by mouth daily.   nitroGLYCERIN (NITROSTAT) 0.4 MG SL tablet PLACE ONE TABLET UNDER THE TONGUE EVERY 5 MINUTES AS NEEDED FOR CHEST PAIN. (Patient taking differently: Place 0.4 mg under the tongue every 5 (five) minutes as needed for chest pain.)   oxybutynin (DITROPAN) 5 MG tablet Take 0.5 tablets (2.5 mg total) by mouth 2 (two) times daily.   pantoprazole (PROTONIX) 40 MG tablet Take 1 tablet (40 mg  total) by mouth 2 (two) times daily.   polyethylene glycol (MIRALAX / GLYCOLAX) packet Take 17 g by mouth daily as needed for mild constipation.   Potassium Chloride ER 20 MEQ TBCR TAKE 2 TABLETS BY MOUTH EVERY MORNING (Patient taking differently: Take 40 mEq by mouth daily.)   QUEtiapine (SEROQUEL) 50 MG tablet TAKE 1 TABLET BY MOUTH EVERYDAY AT BEDTIME (Patient taking differently: Take 50 mg by mouth at bedtime.)    rosuvastatin (CRESTOR) 40 MG tablet TAKE 1 TABLET BY MOUTH ONCE DAILY (Patient taking differently: Take 40 mg by mouth daily.)   SYMBICORT 160-4.5 MCG/ACT inhaler TAKE 2 PUFFS BY MOUTH TWICE A DAY (Patient taking differently: Inhale 2 puffs into the lungs 2 (two) times daily.)   traMADol (ULTRAM) 50 MG tablet TAKE 1 TABLET 3 TIMES A DAY AND CAN TAKE 1 EXTRA 20 DAYS/MONTH (Patient taking differently: Take 50-100 mg by mouth See admin instructions. Take one tablet (50 mg) by mouth twice daily - with breakfast and supper; may take two tablets (100 mg) at bedtime as needed for pain)   [DISCONTINUED] Magnesium 250 MG TABS Take 250 mg by mouth every evening.    Patient Active Problem List   Diagnosis Date Noted   Fall (on)(from) incline, initial encounter 11/23/2020   UTI (urinary tract infection) 11/22/2020   Epigastric pain 11/22/2020   Macrocytosis without anemia 11/22/2020   Unwitnessed fall 11/22/2020   Late onset Alzheimer's dementia with behavioral disturbance ([place]) 11/18/2020   Visual hallucination 11/18/2020   Sundowning 11/18/2020   Non-small cell carcinoma of right lung, stage 1 ([place]) 06/08/2020   Pulmonary nodule 1 cm or greater in diameter 05/21/2020   Atherosclerotic heart disease of native coronary artery with other forms of angina pectoris ([place]) 04/22/2020   Fatigue 02/18/2019   Memory loss 02/18/2019   Vitamin B12 deficiency 10/23/2018   Hypotension 10/23/2018   Stage 3 chronic kidney disease ([place]) 06/03/2018   Chronic anemia 06/03/2018   Hypercholesterolemia 06/03/2018   Malnutrition of moderate degree 07/12/2017   Hypoalbuminemia 07/11/2017   Elevated LFTs 07/11/2017   Closed fracture of left distal radius 05/28/2017   Snoring 12/21/2016   Lower leg edema 12/21/2016   Peripheral musculoskeletal gait disorder 02/05/2015   Lumbar post-laminectomy syndrome 02/05/2015   CAD in native artery 10/16/2014   NSTEMI (non-ST elevated myocardial infarction) ([place]) 09/13/2014    Cardiomyopathy, ischemic 54/65/0354   Chronic systolic heart failure ([place])    Tobacco abuse 09/12/2014   Dyslipidemia 09/12/2014   Normocytic anemia 09/12/2014   Chronic diastolic heart failure, NYHA class 1 ([place]) 09/12/2014   Postlaminectomy syndrome, cervical region 08/01/2013   Chronic midline low back pain with left-sided sciatica 08/01/2013   Shoulder joint contracture 08/01/2013   HTN (hypertension) 03/03/2013   Abnormal genetic test 03/03/2013    Conditions to be addressed/monitored: Stage 3a chronic kidney disease, HTN, chronic systolic heart failure, Mild Cognitive Impairment, Atherosclerotic heart disease of native coronary artery with other forms of angina pectoris, Lung Nodule    Care Plan : Urinary Incontinence (Adult)  Updates made by Lynne Logan, RN since 11/24/2020 12:00 AM     Problem: Harm or Injury (Urinary Incontinence)   Priority: High     Long-Range Goal: Harm or Injury Prevented   Start Date: 11/19/2020  Expected End Date: 12/20/2020  This Visit's Progress: Not on track  Priority: High  Note:   Current Barriers:  Ineffective Self Health Maintenance in a patient with  Stage 3a chronic kidney disease, HTN, chronic systolic  heart failure, Mild Cognitive Impairment, Atherosclerotic heart disease of native coronary artery with other forms of angina pectoris, Lung Nodule   Clinical Goal(s):  Collaboration with [name], FNP regarding development and update of comprehensive plan of care as evidenced by provider attestation and co-signature Inter-disciplinary care team collaboration (see longitudinal plan of care) patient will work with care management team to address care coordination and chronic disease management needs related to Disease Management Educational Needs Care Coordination Medication Management and Education Psychosocial Support Dementia and Caregiver Support   Interventions:  11/19/20 completed successful outbound call with son  [name] Evaluation of current treatment plan related to  Stage 3a chronic kidney disease, HTN, chronic systolic heart failure, Mild Cognitive Impairment, Atherosclerotic heart disease of native coronary artery with other forms of angina pectoris, Lung Nodule   ,  self-management and patient's adherence to plan as established by provider. Collaboration with [name], FNP regarding development and update of comprehensive plan of care as evidenced by provider attestation       and co-signature Inter-disciplinary care team collaboration (see longitudinal plan of care) Determined patient missed her follow up with PCP provider scheduled for 11/18/20 @11 :00 AM due to arriving to the appointment late Determined patient continues to display increased agitation and urinary incontinence  Educated son on signs and symptoms suggestive of UTI, educated on treatment and potential complications if UTI left untreated Instructed son to take the patient to an Urgent Care and or ED for evaluation and treatment of UTI due to continued agitation and increased incontinence Sent in basket message to PCP providers Debbe Mounts, FNP and [name], FNP with update regarding patient being turned away from her scheduled PCP appointment on 11/18/20 due to arriving late; advised I instructed son [name] to take her to Urgent Care or the ED for evaluation of UTI Collaborated with embedded BSW to provide a patient status update  Discussed plans with patient for ongoing care management follow up and provided patient with direct contact information for care management team Self Care Activities:  Patient unable to perform Self Care Activities  Patient Goals: - Seek medical attention for evaluation of possible UTI  Follow Up Plan: Telephone follow up appointment with care management team member scheduled for: 11/23/20    Plan:Telephone follow up appointment with care management team member scheduled for:  11/23/20  Barb Merino,  RN, BSN, CCM Care Management Coordinator [place] [facility]  Direct Phone: [phone]

## 2020-11-25 ENCOUNTER — Other Ambulatory Visit: Payer: Self-pay | Admitting: Nurse Practitioner

## 2020-11-25 ENCOUNTER — Telehealth: Payer: Self-pay

## 2020-11-25 DIAGNOSIS — G3184 Mild cognitive impairment, so stated: Secondary | ICD-10-CM

## 2020-11-25 DIAGNOSIS — R296 Repeated falls: Secondary | ICD-10-CM | POA: Diagnosis not present

## 2020-11-25 LAB — URINE CULTURE: Culture: >=100000 — AB

## 2020-11-25 MED ORDER — CEPHALEXIN 500 MG PO CAPS
500.0000 mg | ORAL_CAPSULE | Freq: Three times a day (TID) | ORAL | Status: AC
  Administered 2020-11-25 (×3): 500 mg via ORAL
  Filled 2020-11-25 (×3): qty 1

## 2020-11-25 MED ORDER — SODIUM CHLORIDE 0.9 % IV BOLUS
250.0000 mL | Freq: Once | INTRAVENOUS | Status: AC
  Administered 2020-11-25: 250 mL via INTRAVENOUS

## 2020-11-25 MED ORDER — CEPHALEXIN 500 MG PO CAPS: 500.0000 mg | ORAL_CAPSULE | Freq: Four times a day (QID) | ORAL | 0 refills | Status: DC

## 2020-11-25 MED ORDER — TRAMADOL HCL 50 MG PO TABS
50.0000 mg | ORAL_TABLET | Freq: Two times a day (BID) | ORAL | Status: DC | PRN
  Administered 2020-11-26: 50 mg via ORAL
  Filled 2020-11-25: qty 1

## 2020-11-25 MED ORDER — CEPHALEXIN 500 MG PO CAPS: 500.0000 mg | ORAL_CAPSULE | Freq: Four times a day (QID) | ORAL | Status: DC

## 2020-11-25 NOTE — Chronic Care Management (AMB) (Signed)
Chronic Care Management Pharmacy Assistant   Name: AMELA HANDLEY  MRN: [id number] DOB: [birth year]  Reason for Encounter: Coordination of Enhanced Pharmacy Services Follow up/ Hospital Discharge Review  11/25/2020- Called patient son to follow up on medicatons and discuss delivery of [facility] services, when checking chart noticed patient is currently admitted to the hospital as of 11/22/2020. Spoke with son, he stated patient is going to be transferred to [facility] soon. Checked on status of patient and he reports patient stated she was still in some pain. Son aware that I will follow up on patient status before we plan for delivery with [facility]. Son also had questions on upcoming visits patient has and will need cancelled. Patient aware I will contact her Podiatrist Dr. Cannon Kettle to cancel appointment and will send a message to [facility] services to cancel appointment. Will follow up on patient status.   Hospital visits:  Medication Reconciliation was completed by comparing discharge summary, patient's EMR and Pharmacy list, and upon discussion with patient.  Admitted to the hospital on 11/22/2020 due to Unwitnessed fall. Discharge date was 11/27/2020. Discharged from [facility]?Medications Started at [facility] Discharge:?? -started None  Medication Changes at Hospital Discharge: -Changed Furosemide 20 mg instructions changed to take 1 tablet every other day, Nitroglycerin instructions changed to place one tablet under tongue every 5 mins as needed for chest pains. Potassium 20 meq changed to take 2 tablets every morning, Quetiapine 50 mg changed to take 1 tablet everyday at bedtime. Symbicort instructions changed to take 2 puffs twice a day and Tramadol 50 mg new instructions to take 1 tablet 3 times a day and can take 1 extra 20 days a month.   Medications Discontinued at Hospital Discharge: -Stopped None  Medications  that remain the same after Hospital Discharge:??  -All other medications will remain the same.    Medications: Facility-Administered Encounter Medications as of 11/25/2020  Medication   0.9 %  sodium chloride infusion   acetaminophen (TYLENOL) tablet 650 mg   Or   acetaminophen (TYLENOL) suppository 650 mg   aspirin EC tablet 81 mg   cephALEXin (KEFLEX) capsule 500 mg   cholecalciferol (VITAMIN D3) tablet 1,000 Units   donepezil (ARICEPT) tablet 10 mg   enoxaparin (LOVENOX) injection 40 mg   ferrous sulfate tablet 325 mg   furosemide (LASIX) tablet 20 mg   gabapentin (NEURONTIN) capsule 100 mg   magnesium oxide (MAG-OX) tablet 200 mg   memantine (NAMENDA) tablet 5 mg   metoprolol tartrate (LOPRESSOR) tablet 25 mg   mometasone-formoterol (DULERA) 200-5 MCG/ACT inhaler 2 puff   multivitamin with minerals tablet 1 tablet   nitroGLYCERIN (NITROSTAT) SL tablet 0.4 mg   oxybutynin (DITROPAN) tablet 2.5 mg   pantoprazole (PROTONIX) EC tablet 40 mg   potassium chloride (KLOR-CON) CR tablet 40 mEq   QUEtiapine (SEROQUEL) tablet 50 mg   rosuvastatin (CRESTOR) tablet 40 mg   sodium chloride flush (NS) 0.9 % injection 3 mL   sodium chloride flush (NS) 0.9 % injection 3 mL   traMADol (ULTRAM) tablet 50 mg   Zinc Oxide (TRIPLE PASTE) 12.8 % ointment   Outpatient Encounter Medications as of 11/25/2020  Medication Sig   aspirin EC 81 MG tablet Take 81 mg by mouth daily as needed (headache).   Calcium Citrate-Vitamin D (CALCIUM CITRATE+D3 PETITES PO) Take 1 tablet by mouth daily.   cephALEXin (KEFLEX) 500 MG capsule Take 1 capsule (500 mg  total) by mouth 4 (four) times daily for 4 days.   cholecalciferol (VITAMIN D) 25 MCG (1000 UNIT) tablet Take 1,000 Units by mouth daily.   ciclopirox (PENLAC) 8 % solution Apply 1 application topically at bedtime. Apply over nail and surrounding skin. Apply daily over previous coat. After seven (7) days, file nail and continue cycle.   [employer] MAGNESIUM OXIDE 250  MG TABS TAKE 1 TABLET BY MOUTH WITH EVENING MEAL DAILY   donepezil (ARICEPT) 10 MG tablet Take 1 tablet (10 mg total) by mouth at bedtime.   ferrous sulfate 325 (65 FE) MG tablet Take 325 mg by mouth daily with breakfast.   furosemide (LASIX) 20 MG tablet TAKE 1 TABLET BY MOUTH EVERY OTHER DAY   gabapentin (NEURONTIN) 100 MG capsule Take 1 capsule (100 mg total) by mouth 2 (two) times daily.   ibuprofen (ADVIL) 200 MG tablet Take 400 mg by mouth every 6 (six) hours as needed (pain).   memantine (NAMENDA) 5 MG tablet Take 1 tablet (5 mg total) by mouth daily.   metoprolol tartrate (LOPRESSOR) 25 MG tablet TAKE 1 TABLET BY MOUTH TWICE A DAY   Multiple Vitamin (MULTIVITAMIN WITH MINERALS) TABS tablet Take 1 tablet by mouth daily.   nitroGLYCERIN (NITROSTAT) 0.4 MG SL tablet PLACE ONE TABLET UNDER THE TONGUE EVERY 5 MINUTES AS NEEDED FOR CHEST PAIN.   oxybutynin (DITROPAN) 5 MG tablet Take 0.5 tablets (2.5 mg total) by mouth 2 (two) times daily.   pantoprazole (PROTONIX) 40 MG tablet Take 1 tablet (40 mg total) by mouth 2 (two) times daily.   polyethylene glycol (MIRALAX / GLYCOLAX) packet Take 17 g by mouth daily as needed for mild constipation.   Potassium Chloride ER 20 MEQ TBCR TAKE 2 TABLETS BY MOUTH EVERY MORNING   QUEtiapine (SEROQUEL) 50 MG tablet TAKE 1 TABLET BY MOUTH EVERYDAY AT BEDTIME   rosuvastatin (CRESTOR) 40 MG tablet TAKE 1 TABLET BY MOUTH ONCE DAILY   SYMBICORT 160-4.5 MCG/ACT inhaler TAKE 2 PUFFS BY MOUTH TWICE A DAY   traMADol (ULTRAM) 50 MG tablet TAKE 1 TABLET 3 TIMES A DAY AND CAN TAKE 1 EXTRA 20 DAYS/MONTH    Care Gaps: Zoster Vaccines- Shingrix- Overdue - never done PNA vac Low Risk Adult (2 of 2 - PPSV23)- Last completed: Aug 30, 2011 COVID-19 Vaccine (3 - Pfizer risk series)- Last completed: Sep 25, 2019 INFLUENZA VACCINE- Due December 13, 2020 Annual Wellness Exam scheduled for 12/23/2020.   Star Rating Drugs: Rosuvastatin 40 mg- Last filled 11/28/2020 for a 30 day  supply at [facility]   Follow up visit with PCP- [name] on 12/15/2020 at 11:00 AM. Follow up visit with CCM Pharmacist- Orlando Penner on 02/03/2021 at 2:45 PM  Note: Will follow up with patient after Hospital follow up visit with PCP to finalize Pharmacy Services.   Pattricia Boss, [place] Pharmacist Assistant [phone]

## 2020-11-25 NOTE — Progress Notes (Signed)
[facility] PROGRESS NOTE    Progress Note  Sara Fox  STM:[id number] DOB: November 15, 1940 DOA: 11/22/2020 PCP: [name], FNP     Brief Narrative:   Sara Fox is an 80 y.o. 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  Significant studies: 11/22/2020 CT scan of the head and C-spine showed no acute findings. 11/22/2020 chest x-ray showed indistinct opacity of the right lung apex 11/22/2020 hip x-ray showed no identifiable pelvic fractures 11/22/2020 left elbow x-ray showed no acute findings  Patient is awaiting skilled nursing facility placement. Assessment/Plan:   Unwitnessed fall question mechanical: Urine culture show E. coli sensitive to Keflex will go ahead and transition to oral antibiotics continue at skilled nursing facility. CT of the chest showed T9 compression fracture subacute. Discontinue NSAIDs. Patient is medically stable to be transferred to skilled nursing facility.  Chronic systolic and diastolic heart failure: Continue beta-blockers and Lasix.  Euvolemic.  Lower extremity pain and swelling: Lower extremity Doppler was negative. Discontinue naproxen use tramadol.  Stage I non-small cell lung carcinoma: Follow-up with oncology as an outpatient.  Possible UTI: Continue antibiotics for 1 additional day, urine culture grew E. coli.  Lewy body dementia: Continue Aricept, Namenda and Seroquel.  Essential hypertension: Continue metoprolol.   Stage II sacral decubitus ulcer present on admission: RN Pressure Injury Documentation: Pressure Injury 07/18/17 Stage II -  Partial thickness loss of dermis presenting as a shallow open ulcer with a red, pink wound bed without slough. (Active)  07/18/17 0730   Location: Sacrum  Location Orientation:   Staging: Stage II -  Partial thickness loss of dermis presenting as a shallow open ulcer with a red, pink wound bed without slough.  Wound Description (Comments):   Present on Admission: Yes    Estimated body mass index is 21.17 kg/m as calculated from the following:   Height as of 11/18/20: 5\' 5"  (1.651 m).   Weight as of this encounter: 57.7 kg.   DVT prophylaxis: lovenox Family Communication:none Status is: Inpatient  Remains inpatient appropriate because:Hemodynamically unstable  Dispo: The patient is from: Home              Anticipated d/c is to: SNF              Patient currently is not medically stable to d/c.   Difficult to place patient No        Code Status:     Code Status Orders  (From admission, onward)           Start     Ordered   11/22/20 1904  Do not attempt resuscitation (DNR)  Continuous       Question Answer Comment  In the event of cardiac or respiratory ARREST Do not call a "code blue"   In the event of cardiac or respiratory ARREST Do not perform Intubation, CPR, defibrillation or ACLS   In the event of cardiac or respiratory ARREST Use medication by any route, position, wound care, and other measures to relive pain and suffering. May use oxygen, suction and manual treatment of airway obstruction as needed for comfort.      11/22/20 1906           Code Status History     Date Active Date  Inactive Code Status Order ID Comments User Context   07/11/2017 0002 07/20/2017 2327 Full Code [id number]  Norval Morton, MD ED   09/14/2014 1337 09/15/2014 1546 Full Code [id number]  Leonie Man, MD Inpatient   09/12/2014 0849 09/14/2014 1337 Full Code [id number]  [name], NP Inpatient         IV Access:   Peripheral IV   Procedures and diagnostic studies:   ECHOCARDIOGRAM COMPLETE  Result Date: 11/23/2020    ECHOCARDIOGRAM REPORT   Patient Name:   Sara Fox Date of Exam: 11/23/2020  Medical Rec #:  [id number]      Height:       65.0 in Accession #:    [id number]     Weight:       130.7 lb Date of Birth:  July 29, 1940      BSA:          1.651 m Patient Age:    78 years       BP:           135/71 mmHg Patient Gender: F              HR:           63 bpm. Exam Location:  Inpatient Procedure: 2D Echo, 3D Echo, Cardiac Doppler and Color Doppler Indications:    I50.40* Unspecified combined systolic (congestive) and diastolic                 (congestive) heart failure  History:        Patient has prior history of Echocardiogram examinations, most                 recent 07/12/2017. Cardiomyopathy, CAD and Previous Myocardial                 Infarction, Signs/Symptoms:Altered Mental Status; Risk                 Factors:Hypertension, Dyslipidemia and Former Smoker.  Sonographer:    Roseanna Rainbow RDCS Referring Phys: [id number] [facility]  Sonographer Comments: Technically difficult study due to poor echo windows. IMPRESSIONS  1. Left ventricular ejection fraction, by estimation, is 55 to 60%. Left ventricular ejection fraction by 3D volume is 55 %. The left ventricle has normal function. The left ventricle has no regional wall motion abnormalities. There is mild concentric left ventricular hypertrophy. Left ventricular diastolic parameters are consistent with Grade I diastolic dysfunction (impaired relaxation).  2. Right ventricular systolic function is normal. The right ventricular size is normal. Tricuspid regurgitation signal is inadequate for assessing PA pressure.  3. The mitral valve is degenerative. No evidence of mitral valve regurgitation. No evidence of mitral stenosis. Moderate mitral annular calcification.  4. The aortic valve is tricuspid. Aortic valve regurgitation is not visualized. Mild to moderate aortic valve sclerosis/calcification is present, without any evidence of aortic stenosis.  5. Aortic dilatation noted. There is borderline dilatation of the ascending aorta, measuring 37 mm.  6. The  inferior vena cava is dilated in size with >50% respiratory variability, suggesting right atrial pressure of 8 mmHg. FINDINGS  Left Ventricle: Left ventricular ejection fraction, by estimation, is 55 to 60%. Left ventricular ejection fraction by 3D volume is 55 %. The left ventricle has normal function. The left ventricle has no regional wall motion abnormalities. The left ventricular internal cavity size was normal in size. There is mild concentric left ventricular hypertrophy. Left ventricular diastolic parameters are consistent with Grade I diastolic dysfunction (  impaired relaxation). Normal left ventricular filling pressure. Right Ventricle: The right ventricular size is normal. No increase in right ventricular wall thickness. Right ventricular systolic function is normal. Tricuspid regurgitation signal is inadequate for assessing PA pressure. Left Atrium: Left atrial size was normal in size. Right Atrium: Right atrial size was normal in size. Pericardium: There is no evidence of pericardial effusion. Mitral Valve: The mitral valve is degenerative in appearance. There is mild thickening of the mitral valve leaflet(s). There is mild calcification of the mitral valve leaflet(s). Moderate mitral annular calcification. No evidence of mitral valve regurgitation. No evidence of mitral valve stenosis. Tricuspid Valve: The tricuspid valve is normal in structure. Tricuspid valve regurgitation is trivial. No evidence of tricuspid stenosis. Aortic Valve: The aortic valve is tricuspid. Aortic valve regurgitation is not visualized. Mild to moderate aortic valve sclerosis/calcification is present, without any evidence of aortic stenosis. Pulmonic Valve: The pulmonic valve was normal in structure. Pulmonic valve regurgitation is trivial. No evidence of pulmonic stenosis. Aorta: Aortic dilatation noted. There is borderline dilatation of the ascending aorta, measuring 37 mm. Venous: The inferior vena cava is dilated in size with  greater than 50% respiratory variability, suggesting right atrial pressure of 8 mmHg. IAS/Shunts: No atrial level shunt detected by color flow Doppler.  LEFT VENTRICLE PLAX 2D LVIDd:         3.30 cm         Diastology LVIDs:         2.20 cm         LV e' medial:    4.90 cm/s LV PW:         1.30 cm         LV E/e' medial:  12.8 LV IVS:        1.20 cm         LV e' lateral:   6.31 cm/s LVOT diam:     2.00 cm         LV E/e' lateral: 9.9 LV SV:         54 LV SV Index:   33 LVOT Area:     3.14 cm        3D Volume EF                                LV 3D EF:    Left                                             ventricular LV Volumes (MOD)                            ejection LV vol d, MOD    67.8 ml                    fraction by A2C:                                        3D volume LV vol d, MOD    80.6 ml                    is 55 %. A4C: LV vol s, MOD  23.4 ml A2C:                           3D Volume EF: LV vol s, MOD    32.4 ml       3D EF:        55 % A4C: LV SV MOD A2C:   44.4 ml LV SV MOD A4C:   80.6 ml LV SV MOD BP:    50.3 ml RIGHT VENTRICLE            IVC RV S prime:     9.03 cm/s  IVC diam: 2.10 cm TAPSE (M-mode): 2.0 cm LEFT ATRIUM             Index       RIGHT ATRIUM           Index LA diam:        3.30 cm 2.00 cm/m  RA Area:     12.50 cm LA Vol (A2C):   37.8 ml 22.89 ml/m RA Volume:   27.60 ml  16.72 ml/m LA Vol (A4C):   31.4 ml 19.02 ml/m LA Biplane Vol: 33.8 ml 20.47 ml/m  AORTIC VALVE LVOT Vmax:   84.20 cm/s LVOT Vmean:  61.600 cm/s LVOT VTI:    0.171 m  AORTA Ao Root diam: 3.60 cm Ao Asc diam:  3.70 cm MITRAL VALVE MV Area (PHT): 2.17 cm    SHUNTS MV Decel Time: 349 msec    Systemic VTI:  0.17 m MV E velocity: 62.60 cm/s  Systemic Diam: 2.00 cm MV A velocity: 96.00 cm/s MV E/A ratio:  0.65 Fransico Him MD Electronically signed by Fransico Him MD Signature Date/Time: 11/23/2020/10:21:21 AM    Final      Medical Consultants:   None.   Subjective:    Sara Fox no  complaints  Objective:    Vitals:   11/25/20 0056 11/25/20 0228 11/25/20 0438 11/25/20 0835  BP: (!) 84/58 94/64 (!) 102/48 137/67  Pulse: 74  62 62  Resp: 16  16 18   Temp: 97.7 F (36.5 C)  (!) 97.4 F (36.3 C) (!) 97.5 F (36.4 C)  TempSrc: Oral  Oral Oral  SpO2: 99%  96% 96%  Weight:       SpO2: 96 %   Intake/Output Summary (Last 24 hours) at 11/25/2020 0920 Last data filed at 11/25/2020 0540 Gross per 24 hour  Intake 751.29 ml  Output 1200 ml  Net -448.71 ml    Filed Weights   11/22/20 2101 11/24/20 0414  Weight: 59.3 kg 57.7 kg    Exam: General exam: In no acute distress. Respiratory system: Good air movement and clear to auscultation. Cardiovascular system: S1 & S2 heard, RRR. No JVD. Gastrointestinal system: Abdomen is nondistended, soft and nontender.  Extremities: No pedal edema. Skin: No rashes, lesions or ulcers  Data Reviewed:    Labs: Basic Metabolic Panel: Recent Labs  Lab 11/22/20 1354 11/23/20 0042 11/24/20 0221  NA 138 137 137  K 4.6 3.6 4.6  CL 106 105 106  CO2 23 24 23   GLUCOSE 86 100* 106*  BUN 24* 25* 26*  CREATININE 1.23* 1.27* 1.37*  CALCIUM 9.5 8.9 8.4*  PHOS  --   --  3.4    GFR Estimated Creatinine Clearance: 29 mL/min (A) (by C-G formula based on SCr of 1.37 mg/dL (H)). Liver Function Tests: Recent Labs  Lab 11/22/20 1354 11/24/20 0221  AST 22  --  ALT 19  --   ALKPHOS 103  --   BILITOT 0.9  --   PROT 6.8  --   ALBUMIN 3.4* 2.5*    Recent Labs  Lab 11/23/20 0042  LIPASE 32    No results for input(s): AMMONIA in the last 168 hours. Coagulation profile No results for input(s): INR, PROTIME in the last 168 hours. COVID-19 Labs  No results for input(s): DDIMER, FERRITIN, LDH, CRP in the last 72 hours.  Lab Results  Component Value Date   SARSCOV2NAA NEGATIVE 11/22/2020   [place] NEGATIVE 05/22/2020    CBC: Recent Labs  Lab 11/22/20 1354 11/22/20 1901 11/23/20 0042 11/24/20 0221  WBC 5.7  5.9 5.9 6.9  NEUTROABS 4.5  --   --  5.2  HGB 12.5 11.4* 11.8* 9.6*  HCT 42.0 38.3 39.2 31.6*  MCV 102.2* 102.1* 100.0 99.7  PLT 257 232 307 263    Cardiac Enzymes: No results for input(s): CKTOTAL, CKMB, CKMBINDEX, TROPONINI in the last 168 hours. BNP (last 3 results) No results for input(s): PROBNP in the last 8760 hours. CBG: No results for input(s): GLUCAP in the last 168 hours. D-Dimer: No results for input(s): DDIMER in the last 72 hours. Hgb A1c: No results for input(s): HGBA1C in the last 72 hours. Lipid Profile: No results for input(s): CHOL, HDL, LDLCALC, TRIG, CHOLHDL, LDLDIRECT in the last 72 hours. Thyroid function studies: Recent Labs    11/22/20 1905  TSH 0.918    Anemia work up: Recent Labs    11/23/20 0042  VITAMINB12 298  FOLATE 8.9    Sepsis Labs: Recent Labs  Lab 11/22/20 1354 11/22/20 1901 11/23/20 0042 11/24/20 0221  WBC 5.7 5.9 5.9 6.9  LATICACIDVEN 1.7  --   --   --     Microbiology Recent Results (from the past 240 hour(s))  Culture, blood (single)     Status: None (Preliminary result)   Collection Time: 11/22/20  1:54 PM   Specimen: BLOOD  Result Value Ref Range Status   Specimen Description BLOOD SITE NOT SPECIFIED  Final   Special Requests   Final    BOTTLES DRAWN AEROBIC AND ANAEROBIC Blood Culture results may not be optimal due to an inadequate volume of blood received in culture bottles   Culture   Final    NO GROWTH 3 DAYS Performed at [facility], [place] [address], [place], [place] [address]    Report Status PENDING  Incomplete  Urine culture     Status: Abnormal   Collection Time: 11/22/20  3:52 PM   Specimen: Urine, Random  Result Value Ref Range Status   Specimen Description URINE, RANDOM  Final   Special Requests   Final    NONE Performed at [facility], [place] [address], [place], [place] [address]    Culture >=100,000 COLONIES/mL ESCHERICHIA COLI (A)  Final   Report Status 11/25/2020 FINAL  Final    Organism ID, Bacteria ESCHERICHIA COLI (A)  Final      Susceptibility   Escherichia coli - MIC*    AMPICILLIN <=2 SENSITIVE Sensitive     CEFAZOLIN <=4 SENSITIVE Sensitive     CEFEPIME <=0.12 SENSITIVE Sensitive     CEFTRIAXONE <=0.25 SENSITIVE Sensitive     CIPROFLOXACIN <=0.25 SENSITIVE Sensitive     GENTAMICIN <=1 SENSITIVE Sensitive     IMIPENEM <=0.25 SENSITIVE Sensitive     NITROFURANTOIN <=16 SENSITIVE Sensitive     TRIMETH/SULFA <=20 SENSITIVE Sensitive  AMPICILLIN/SULBACTAM <=2 SENSITIVE Sensitive     PIP/TAZO <=4 SENSITIVE Sensitive     * >=100,000 COLONIES/mL ESCHERICHIA COLI  Resp Panel by RT-PCR (Flu A&B, Covid) Nasopharyngeal Swab     Status: None   Collection Time: 11/22/20  4:58 PM   Specimen: Nasopharyngeal Swab; Nasopharyngeal(NP) swabs in vial transport medium  Result Value Ref Range Status   SARS Coronavirus 2 by RT PCR NEGATIVE NEGATIVE Final    Comment: (NOTE) SARS-CoV-2 target nucleic acids are NOT DETECTED.  The SARS-CoV-2 RNA is generally detectable in upper respiratory specimens during the acute phase of infection. The lowest concentration of SARS-CoV-2 viral copies this assay can detect is 138 copies/mL. A negative result does not preclude SARS-Cov-2 infection and should not be used as the sole basis for treatment or other patient management decisions. A negative result may occur with  improper specimen collection/handling, submission of specimen other than nasopharyngeal swab, presence of viral mutation(s) within the areas targeted by this assay, and inadequate number of viral copies(<138 copies/mL). A negative result must be combined with clinical observations, patient history, and epidemiological information. The expected result is Negative.  Fact Sheet for Patients:  EntrepreneurPulse.com.au  Fact Sheet for Healthcare Providers:  IncredibleEmployment.be  This test is no t yet approved or cleared by the  [place] FDA and  has been authorized for detection and/or diagnosis of SARS-CoV-2 by FDA under an Emergency Use Authorization (EUA). This EUA will remain  in effect (meaning this test can be used) for the duration of the COVID-19 declaration under Section 564(b)(1) of the Act, 21 U.S.C.section 360bbb-3(b)(1), unless the authorization is terminated  or revoked sooner.       Influenza A by PCR NEGATIVE NEGATIVE Final   Influenza B by PCR NEGATIVE NEGATIVE Final    Comment: (NOTE) The Xpert Xpress SARS-CoV-2/FLU/RSV plus assay is intended as an aid in the diagnosis of influenza from Nasopharyngeal swab specimens and should not be used as a sole basis for treatment. Nasal washings and aspirates are unacceptable for Xpert Xpress SARS-CoV-2/FLU/RSV testing.  Fact Sheet for Patients: EntrepreneurPulse.com.au  Fact Sheet for Healthcare Providers: IncredibleEmployment.be  This test is not yet approved or cleared by the [place] FDA and has been authorized for detection and/or diagnosis of SARS-CoV-2 by FDA under an Emergency Use Authorization (EUA). This EUA will remain in effect (meaning this test can be used) for the duration of the COVID-19 declaration under Section 564(b)(1) of the Act, 21 U.S.C. section 360bbb-3(b)(1), unless the authorization is terminated or revoked.  Performed at [facility], [place] [address], [place], [place] [address]      Medications:    cholecalciferol  1,000 Units Oral Daily   donepezil  10 mg Oral QHS   enoxaparin (LOVENOX) injection  40 mg Subcutaneous Q24H   ferrous sulfate  325 mg Oral Q breakfast   furosemide  20 mg Oral QODAY   gabapentin  100 mg Oral QHS   magnesium oxide  200 mg Oral Q supper   memantine  5 mg Oral Daily   metoprolol tartrate  25 mg Oral BID   mometasone-formoterol  2 puff Inhalation BID   multivitamin with minerals  1 tablet Oral Daily   naproxen  375 mg Oral BID WC    oxybutynin  2.5 mg Oral BID   pantoprazole  40 mg Oral BID   potassium chloride  40 mEq Oral Daily   QUEtiapine  50 mg Oral QHS   rosuvastatin  40 mg Oral  Daily   sodium chloride flush  3 mL Intravenous Q12H   Zinc Oxide   Topical QID   Continuous Infusions:  sodium chloride     cefTRIAXone (ROCEPHIN)  IV 1 g (11/24/20 2117)      LOS: 2 days   Charlynne Cousins  [facility]  11/25/2020, 9:20 AM

## 2020-11-25 NOTE — Progress Notes (Signed)
Physical Therapy Treatment Patient Details Name: Sara Fox MRN: [id number] DOB: 1940-08-07 Today's Date: 11/25/2020    History of Present Illness 80 yo female with an unwitnessed fall from tripping over pillows (per pt) was brought to hosp and found stable T9 fracture, has UTI and susp PNA, with PMHx:  tobacco use, CHF, HTN, atherosclerosis, thoracic spondylosis, L shoulder OA, cervical spine multilevel fusions, CKF3, non small cell lung CA, Dementia, COPD    PT Comments    Pt much more alert than on eval and able to progress mobility to amb in room with assist. Expect mobility will continue to progress at SNF and pt wants to ultimately return home.    Follow Up Recommendations  SNF     Equipment Recommendations  None recommended by PT    Recommendations for Other Services       Precautions / Restrictions Precautions Precautions: Fall;Back    Mobility  Bed Mobility Overal bed mobility: Needs Assistance Bed Mobility: Rolling;Sidelying to Sit (scooting up in bed) Rolling: Min assist Sidelying to sit: Min assist       General bed mobility comments: Assist to elevate trun into sitting    Transfers Overall transfer level: Needs assistance Equipment used: Rolling walker (2 wheeled) Transfers: Sit to/from Stand Sit to Stand: Min assist         General transfer comment: Assist to bring hips up and for balance  Ambulation/Gait Ambulation/Gait assistance: Min assist Gait Distance (Feet): 20 Feet Assistive device: Rolling walker (2 wheeled) Gait Pattern/deviations: Step-through pattern;Trunk flexed Gait velocity: decr Gait velocity interpretation: <1.8 ft/sec, indicate of risk for recurrent falls General Gait Details: Assist for balance and verbal cues to not step past the walker.   Stairs             Wheelchair Mobility    Modified Rankin (Stroke Patients Only)       Balance Overall balance assessment: Needs assistance Sitting-balance support: No  upper extremity supported;Feet supported Sitting balance-Leahy Scale: Good     Standing balance support: Single extremity supported;During functional activity Standing balance-Leahy Scale: Poor Standing balance comment: UE support and min guard for static standing                            Cognition Arousal/Alertness: Awake/alert Behavior During Therapy: Impulsive Overall Cognitive Status: No family/caregiver present to determine baseline cognitive functioning                                 General Comments: Pt with baseline dementia      Exercises      General Comments        Pertinent Vitals/Pain Pain Assessment: Faces Faces Pain Scale: Hurts even more Pain Location: lt flank/back Pain Descriptors / Indicators: Grimacing;Guarding Pain Intervention(s): Limited activity within patient's tolerance;Repositioned    Home Living                      Prior Function            PT Goals (current goals can now be found in the care plan section) Acute Rehab PT Goals Patient Stated Goal: go home Progress towards PT goals: Progressing toward goals    Frequency    Min 2X/week      PT Plan Current plan remains appropriate;Frequency needs to be updated    Co-evaluation  AM-PAC PT "6 Clicks" Mobility   Outcome Measure  Help needed turning from your back to your side while in a flat bed without using bedrails?: A Little Help needed moving from lying on your back to sitting on the side of a flat bed without using bedrails?: A Little Help needed moving to and from a bed to a chair (including a wheelchair)?: A Little Help needed standing up from a chair using your arms (e.g., wheelchair or bedside chair)?: A Little Help needed to walk in hospital room?: A Little Help needed climbing 3-5 steps with a railing? : A Lot 6 Click Score: 17    End of Session Equipment Utilized During Treatment: Gait belt Activity  Tolerance: Patient tolerated treatment well Patient left: in chair;with call bell/phone within reach;with chair alarm set Nurse Communication: Mobility status PT Visit Diagnosis: Muscle weakness (generalized) (M62.81);Pain;Other (comment) Pain - Right/Left: Left Pain - part of body:  (flank)     Time: 5364-6803 PT Time Calculation (min) (ACUTE ONLY): 14 min  Charges:  $Gait Training: 8-22 mins                     [place] Pager [phone] Office [place] 11/25/2020, 4:56 PM

## 2020-11-25 NOTE — TOC Progression Note (Signed)
Transition of Care [facility]) - Progression Note    Patient Details  Name: Sara Fox MRN: [id number] Date of Birth: 04/28/1941  [facility]) CM/SW Contact  Sara Fox, [place] Phone Number: 11/25/2020, 1:14 PM  Clinical Narrative:     CSW gave to bed offers. Pt requested bed offers to be given to her son. Pt became frustrated that he couldn't not remember her sons name. CSW assured pt that she can get pts name and number out of her chart.   CSW called pts son Sara Fox stated that he would like for his mother to pick which facility. Sara Fox stated that if she cant or wont pick a bed.   4:00pm Pt chose [facility]. CSW confirmed they can accept pt at discharge.   Expected Discharge Plan: Skilled Nursing Facility Barriers to Discharge: Ship broker, Continued Medical Work up  Expected Discharge Plan and Services Expected Discharge Plan: [place] arrangements for the past 2 months: Single Family Home                                       Social Determinants of Health (SDOH) Interventions    Readmission Risk Interventions No flowsheet data found.  Sara Fox, Latanya Presser, [place] Social Worker [phone]

## 2020-11-26 ENCOUNTER — Encounter: Payer: Self-pay | Admitting: Nurse Practitioner

## 2020-11-26 DIAGNOSIS — I5032 Chronic diastolic (congestive) heart failure: Secondary | ICD-10-CM

## 2020-11-26 DIAGNOSIS — W19XXXA Unspecified fall, initial encounter: Secondary | ICD-10-CM

## 2020-11-26 DIAGNOSIS — N182 Chronic kidney disease, stage 2 (mild): Secondary | ICD-10-CM

## 2020-11-26 DIAGNOSIS — C3491 Malignant neoplasm of unspecified part of right bronchus or lung: Secondary | ICD-10-CM

## 2020-11-26 DIAGNOSIS — R1013 Epigastric pain: Secondary | ICD-10-CM

## 2020-11-26 LAB — SARS CORONAVIRUS 2 (TAT 6-24 HRS): SARS Coronavirus 2: NEGATIVE

## 2020-11-26 NOTE — TOC Progression Note (Signed)
Transition of Care [facility]) - Progression Note    Patient Details  Name: Sara Fox MRN: [id number] Date of Birth: 1940-06-26  Transition of Care [facility]) CM/SW Contact  Emeterio Reeve, [place] Phone Number: 11/26/2020, 3:40 PM  Clinical Narrative:     Pts covid test came back negative. CSW contacted [facility]. They stated they "should" be able to accept pt tomorrow.   CSW will follow.  Expected Discharge Plan: Skilled Nursing Facility Barriers to Discharge: Ship broker, Continued Medical Work up  Expected Discharge Plan and Services Expected Discharge Plan: [place] arrangements for the past 2 months: Single Family Home Expected Discharge Date: 11/26/20                                     Social Determinants of Health (SDOH) Interventions    Readmission Risk Interventions No flowsheet data found.  Emeterio Reeve, Latanya Presser, [place] Social Worker [phone]

## 2020-11-26 NOTE — Discharge Summary (Signed)
Physician Discharge Summary  Sara Fox GUY:[id number] DOB: May 27, 1940 DOA: 11/22/2020  PCP: [name], FNP  Admit date: 11/22/2020 Discharge date: 11/26/2020  Admitted From: Home Disposition:  SNF  Recommendations for Outpatient Follow-up:  Follow up with PCP in 1-2 weeks Please obtain BMP/CBC in one week   Home Health:no Equipment/Devices:None  Discharge Condition:stable CODE STATUS:DNR Diet recommendation: Heart Healthy  Brief/Interim Summary: 80 y.o. 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   Significant studies: 11/22/2020 CT scan of the head and C-spine showed no acute findings. 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 coli sensitive to Keflex she has completed 5-day course in-house. CT scan of the chest showed a T9 compression fracture which is probably subacute. She was continued on analgesics which works for her pain. Physical therapy evaluated the patient and recommended skilled nursing facility.  Chronic systolic and diastolic heart failure: She  appears euvolemic on physical exam no changes made to her medication.  Lower extremity pain and swelling: Lower extremity Doppler was negative for DVT. Naproxen was discontinued from her regimen and she was started on tramadol given ibuprofen which controlled her pain.  Stage I non-small cell carcinoma of the lung: Will need to follow-up with oncology as an outpatient.  Possible UTI: She has completed her course of antibiotics in house.  Lewy body dementia: Continue Aricept and Namenda and Seroquel.   Nchanges made to her medication.  Essential hypertension: No changes made to her medication.  Stage II sacral decubitus ulcer present on admission:  Discharge Instructions  Discharge Instructions     Diet - low sodium heart healthy   Complete by: As directed    Increase activity slowly   Complete by: As directed       Allergies as of 11/26/2020       Reactions   Buprenorphine Hcl Shortness Of Breath   Throat swelling/trouble breathing and lethargic   Morphine And Related Shortness Of Breath   Throat swelling/trouble breathing and lethargic   Celebrex [celecoxib] Diarrhea, Swelling   Swelling of legs    Vioxx [rofecoxib] Palpitations   Codeine Other (See Comments)   Bloated         Medication List     TAKE these medications    aspirin EC 81 MG tablet Take 81 mg by mouth daily as needed (headache).   CALCIUM CITRATE+D3 PETITES PO Take 1 tablet by mouth daily.   cholecalciferol 25 MCG (1000 UNIT) tablet Commonly known as: VITAMIN D Take 1,000 Units by mouth daily.   ciclopirox 8 % solution Commonly known as: Penlac Apply 1 application topically at bedtime. Apply over nail and surrounding  skin. Apply daily over previous coat. After seven (7) days, file nail and continue cycle.   [employer] Magnesium Oxide 250 MG Tabs Generic drug: Magnesium Oxide -Mg Supplement TAKE 1 TABLET BY MOUTH WITH EVENING MEAL DAILY What changed: See the new instructions.   donepezil  10 MG tablet Commonly known as: ARICEPT Take 1 tablet (10 mg total) by mouth at bedtime.   ferrous sulfate 325 (65 FE) MG tablet Take 325 mg by mouth daily with breakfast.   furosemide 20 MG tablet Commonly known as: LASIX TAKE 1 TABLET BY MOUTH EVERY OTHER DAY What changed:  when to take this additional instructions   gabapentin 100 MG capsule Commonly known as: NEURONTIN Take 1 capsule (100 mg total) by mouth 2 (two) times daily.   ibuprofen 200 MG tablet Commonly known as: ADVIL Take 400 mg by mouth every 6 (six) hours as needed (pain).   memantine 5 MG tablet Commonly known as: Namenda Take 1 tablet (5 mg total) by mouth daily.   metoprolol tartrate 25 MG tablet Commonly known as: LOPRESSOR TAKE 1 TABLET BY MOUTH TWICE A DAY   multivitamin with minerals Tabs tablet Take 1 tablet by mouth daily.   nitroGLYCERIN 0.4 MG SL tablet Commonly known as: NITROSTAT PLACE ONE TABLET UNDER THE TONGUE EVERY 5 MINUTES AS NEEDED FOR CHEST PAIN. What changed:  how much to take how to take this when to take this reasons to take this additional instructions   oxybutynin 5 MG tablet Commonly known as: DITROPAN Take 0.5 tablets (2.5 mg total) by mouth 2 (two) times daily.   pantoprazole 40 MG tablet Commonly known as: PROTONIX Take 1 tablet (40 mg total) by mouth 2 (two) times daily.   polyethylene glycol 17 g packet Commonly known as: MIRALAX / GLYCOLAX Take 17 g by mouth daily as needed for mild constipation.   Potassium Chloride ER 20 MEQ Tbcr TAKE 2 TABLETS BY MOUTH EVERY MORNING What changed:  how much to take when to take this   QUEtiapine 50 MG tablet Commonly known as: SEROQUEL TAKE 1 TABLET BY MOUTH EVERYDAY AT BEDTIME What changed:  how much to take how to take this when to take this additional instructions   rosuvastatin 40 MG tablet Commonly known as: CRESTOR TAKE 1 TABLET BY MOUTH ONCE DAILY   Symbicort 160-4.5 MCG/ACT inhaler Generic drug:  budesonide-formoterol TAKE 2 PUFFS BY MOUTH TWICE A DAY What changed: See the new instructions.   traMADol 50 MG tablet Commonly known as: ULTRAM TAKE 1 TABLET 3 TIMES A DAY AND CAN TAKE 1 EXTRA 20 DAYS/MONTH What changed: See the new instructions.        Allergies  Allergen Reactions   Buprenorphine Hcl Shortness Of Breath    Throat swelling/trouble breathing and lethargic   Morphine And Related Shortness Of Breath    Throat swelling/trouble breathing and lethargic   Celebrex [Celecoxib] Diarrhea and Swelling    Swelling of legs    Vioxx [Rofecoxib] Palpitations   Codeine Other (See Comments)    Bloated     Consultations: None   Procedures/Studies: DG Chest 1 View  Result Date: 11/22/2020 CLINICAL DATA:  Fall today EXAM: CHEST  1 VIEW COMPARISON:  CT chest 10/28/2020 and chest radiograph 05/25/2020 FINDINGS: The patient is rotated to the right on today's radiograph, reducing diagnostic sensitivity and specificity. Hazy vascular prominence in the lung apices is indeterminate due to the supine positioning. There is a suggestion of mild interstitial accentuation in the lung apices and  potentially some faint airspace opacity at the right lung apex although the rightward rotation makes this a difficult distinction. Atherosclerotic calcification of the aortic arch. Right proximal humeral prosthesis. No pneumothorax. No blunting of the costophrenic angles. Heart size within normal limits. IMPRESSION: 1. Indistinct opacity at the right lung apex possibly artifactual given the degree of rightward rotation and upper zone pulmonary vascular prominence. Small amount of airspace opacity in this vicinity is difficult to exclude. This could be from contusion, atelectasis, or pneumonia. Chest CT could be used to further workup if clinically warranted. 2.  Atherosclerotic calcification of the aortic arch. Electronically Signed   By: Van Clines M.D.   On: 11/22/2020 15:20   DG Thoracic  Spine 2 View  Result Date: 11/22/2020 CLINICAL DATA:  Fall.  Pain. EXAM: THORACIC SPINE 2 VIEWS COMPARISON:  Chest CT 10/28/2020. FINDINGS: Study limited by marked osteopenia. T9 compression fracture is stable since chest CT of 10/28/2020. No other definite thoracic spine compression fracture evident although again, assessment is limited by the demineralization. Frontal film shows no abnormal paraspinal line. Upper thoracic spine is largely obscured on the lateral projection. IMPRESSION: Stable T9 compression fracture. No other definite thoracic spine fracture evident although assessment is limited by demineralization and obscuration of the upper thoracic spine on the lateral projection. If there is high clinical index of concern, CT imaging may be warranted. Electronically Signed   By: Misty Stanley M.D.   On: 11/22/2020 15:05   DG Pelvis 1-2 Views  Result Date: 11/22/2020 CLINICAL DATA:  Fall EXAM: PELVIS - 1-2 VIEW COMPARISON:  CT data from PET-CT dated 06/21/2020 FINDINGS: Posterolateral rod and pedicle screw fixation in the lower lumbar spine and in the S1 level of the sacrum, with the S1 pedicle screws having surrounding lucency which could imply loosening or infection, but not appreciably changed from 06/21/2020. Bony demineralization is present. Mild sclerosis along the SI joints. Mild to moderate axial loss of articular space in both hips. No compelling findings of fracture. IMPRESSION: 1. No pelvic fracture identified. If the patient is unable to bear weight then cross-sectional imaging may be warranted. 2. Lucency around the S1 pedicle screws as on the prior exam from February, potentially from loosening or infection. 3. Bony demineralization. Electronically Signed   By: Van Clines M.D.   On: 11/22/2020 15:17   DG Elbow Complete Left  Result Date: 11/22/2020 CLINICAL DATA:  Fall, elbow pain EXAM: LEFT ELBOW - COMPLETE 3+ VIEW COMPARISON:  Forearm radiographs of 05/27/2017 FINDINGS: Mild  spurring along the epicondyles of the distal humerus. No elbow fracture or malalignment identified. IMPRESSION: 1. No acute bony findings. Electronically Signed   By: Van Clines M.D.   On: 11/22/2020 15:14   CT Head Wo Contrast  Result Date: 11/22/2020 CLINICAL DATA:  Unwitnessed fall.  Dementia. EXAM: CT HEAD WITHOUT CONTRAST TECHNIQUE: Contiguous axial images were obtained from the base of the skull through the vertex without intravenous contrast. COMPARISON:  06/21/2020 MRI brain FINDINGS: Brain: The brainstem, cerebellum, cerebral peduncles, thalami, basal ganglia, basilar cisterns, and ventricular system appear within normal limits. Periventricular white matter and corona radiata hypodensities favor chronic ischemic microvascular white matter disease. No intracranial hemorrhage, mass lesion, or acute CVA. Vascular: There is atherosclerotic calcification of the cavernous carotid arteries bilaterally. Skull: Unremarkable Sinuses/Orbits: Mild chronic left ethmoid sinusitis. Other: Small left pre-auricular subcutaneous lesion, probably a sebaceous cyst or similar benign lesion, not appreciably changed from 06/21/2020. IMPRESSION: 1. No acute intracranial findings. 2. Periventricular white matter  and corona radiata hypodensities favor chronic ischemic microvascular white matter disease. 3. Mild chronic left ethmoid sinusitis. Electronically Signed   By: Van Clines M.D.   On: 11/22/2020 15:26   CT CHEST WO CONTRAST  Result Date: 10/28/2020 CLINICAL DATA:  Non-small cell lung cancer restaging EXAM: CT CHEST WITHOUT CONTRAST TECHNIQUE: Multidetector CT imaging of the chest was performed following the standard protocol without IV contrast. COMPARISON:  PET-CT, 06/21/2020 FINDINGS: Cardiovascular: Aortic atherosclerosis. Normal heart size. Three-vessel coronary artery calcifications and stents. No pericardial effusion. Mediastinum/Nodes: No enlarged mediastinal, hilar, or axillary lymph nodes.  Thyroid gland, trachea, and esophagus demonstrate no significant findings. Lungs/Pleura: Mild centrilobular and paraseptal emphysema. No significant change in an irregular, heterogeneous lesion of the posterior right pulmonary apex, measuring approximately 3.4 x 3.6 cm in total extent (series 7, image 29). Additional sub solid nodule of the superior segment left lower lobe is unchanged, measuring approximately 6 mm (series 7, image 32) no pleural effusion or pneumothorax. Upper Abdomen: No acute abnormality. Musculoskeletal: No chest wall mass or suspicious bone lesions identified. IMPRESSION: 1. No significant change in an irregular, heterogeneous lesion of the posterior right pulmonary apex, measuring approximately 3.4 x 3.6 cm in total extent. Although generally somewhat infectious or inflammatory in appearance, this represents a biopsy proven malignancy. 2. Unchanged nonspecific 6 mm subsolid nodule of the superior segment left lower lobe. Continued attention on follow-up. 3. No evidence of lymphadenopathy or metastatic disease in the chest. 4. Emphysema. 5. Coronary artery disease. Aortic Atherosclerosis (ICD10-I70.0) and Emphysema (ICD10-J43.9). Electronically Signed   By: Eddie Candle M.D.   On: 10/28/2020 16:54   CT Cervical Spine Wo Contrast  Result Date: 11/22/2020 CLINICAL DATA:  Unwitnessed fall with multifocal pain and history of dementia. EXAM: CT CERVICAL SPINE WITHOUT CONTRAST TECHNIQUE: Multidetector CT imaging of the cervical spine was performed without intravenous contrast. Multiplanar CT image reconstructions were also generated. COMPARISON:  06/26/2013 radiographs FINDINGS: Alignment: The C6-7 level is fused with about 2-3 mm of anterior subluxation at the fused level. No acute subluxation identified. Skull base and vertebrae: Erosions along the base of the odontoid with posterior pannus noted. Erosions along the anterior ring of C1 primarily in the adjacent lateral masses. Sclerosis in the  right lateral mass of C1. No acute fracture or acute bony findings identified. Corpectomy at C4 with ACDF at C3-C5. Fused bilateral facet joints at C3-4 and C6-7. Interbody fusion at C6-7. Soft tissues and spinal canal: Atherosclerotic calcification of both common carotid arteries. Disc levels: Intervertebral and facet spurring contribute to osseous foraminal stenosis on the right at C3-4, C4-5, C5-6, and C6-7; and on the left at C2-3, C4-5, C5-6, and C6-7. Upper chest: Posteriorly in the right lung apex we partially include the biopsy-proven non-small cell lung cancer. Other: No supplemental non-categorized findings. IMPRESSION: 1. No acute cervical spine findings. 2. Multilevel postoperative findings with fusion and corpectomy is noted above. Multilevel impingement. 3. Posteriorly in the apical segment right upper lobe we partially include the biopsy-proven non-small cell lung cancer. Electronically Signed   By: Van Clines M.D.   On: 11/22/2020 15:33   DG Shoulder Left  Result Date: 11/22/2020 CLINICAL DATA:  Fall, shoulder pain EXAM: LEFT SHOULDER - 2+ VIEW COMPARISON:  Humerus radiographs from 01/21/2006 FINDINGS: Humeral head is superiorly subluxed with respect to the glenoid but not overtly dislocated. Severe degenerative glenohumeral arthropathy with subcortical sclerosis, spurring, and some flattening of the contour of the glenoid. No discrete fracture identified. Thoracic spondylosis incidentally noted. IMPRESSION:  1. Severe degenerative glenohumeral arthropathy with superior subluxation of the humeral head but no overt dislocation. Electronically Signed   By: Van Clines M.D.   On: 11/22/2020 15:12   DG Foot Complete Left  Result Date: 11/22/2020 CLINICAL DATA:  Fall.  Leg swelling EXAM: LEFT FOOT - COMPLETE 3+ VIEW COMPARISON:  None. FINDINGS: Truncated head of the proximal phalanx of the small toe with absent middle phalanx of the small toe, correlate with patient history.  Shortened middle phalanx of the fourth toe. No malalignment along the Lisfranc joint. Bony demineralization is present. No acute fracture is identified. Probable mild dorsal subcutaneous edema along the forefoot. IMPRESSION: 1. Truncated distal head of the proximal phalanx small toe in absent middle phalanx, correlate with patient history. I do not see an acute fracture. 2. Shortened middle phalanx of the fourth toe. 3. Bony demineralization. Electronically Signed   By: Van Clines M.D.   On: 11/22/2020 15:06   DG Foot Complete Right  Result Date: 11/22/2020 CLINICAL DATA:  Fall.  Leg swelling EXAM: RIGHT FOOT COMPLETE - 3+ VIEW COMPARISON:  None. FINDINGS: Abnormal appearance of the small toe with absent middle phalanx, small residual ossicle of the distal phalanx, add absent shaft and distal head of the proximal phalanx. Correlate with patient history. Mild hallux valgus. No malalignment at the Lisfranc joint. Bony demineralization. Small plantar and Achilles calcaneal spurs. Dorsal subcutaneous edema in the forefoot. I do not see an acute fracture. IMPRESSION: 1. Deformities in the small toe as noted above. 2. Bony demineralization. 3. No acute fracture identified. 4. Soft tissue swelling in the dorsum of the foot. 5. Calcaneal spurs. Electronically Signed   By: Van Clines M.D.   On: 11/22/2020 15:08   ECHOCARDIOGRAM COMPLETE  Result Date: 11/23/2020    ECHOCARDIOGRAM REPORT   Patient Name:   CORISSA OGUINN Date of Exam: 11/23/2020 Medical Rec #:  [id number]      Height:       65.0 in Accession #:    [id number]     Weight:       130.7 lb Date of Birth:  December 02, 1940      BSA:          1.651 m Patient Age:    41 years       BP:           135/71 mmHg Patient Gender: F              HR:           63 bpm. Exam Location:  Inpatient Procedure: 2D Echo, 3D Echo, Cardiac Doppler and Color Doppler Indications:    I50.40* Unspecified combined systolic (congestive) and diastolic                  (congestive) heart failure  History:        Patient has prior history of Echocardiogram examinations, most                 recent 07/12/2017. Cardiomyopathy, CAD and Previous Myocardial                 Infarction, Signs/Symptoms:Altered Mental Status; Risk                 Factors:Hypertension, Dyslipidemia and Former Smoker.  Sonographer:    Roseanna Rainbow RDCS Referring Phys: [id number] [facility]  Sonographer Comments: Technically difficult study due to poor echo windows. IMPRESSIONS  1. Left ventricular ejection fraction, by estimation, is 55 to  60%. Left ventricular ejection fraction by 3D volume is 55 %. The left ventricle has normal function. The left ventricle has no regional wall motion abnormalities. There is mild concentric left ventricular hypertrophy. Left ventricular diastolic parameters are consistent with Grade I diastolic dysfunction (impaired relaxation).  2. Right ventricular systolic function is normal. The right ventricular size is normal. Tricuspid regurgitation signal is inadequate for assessing PA pressure.  3. The mitral valve is degenerative. No evidence of mitral valve regurgitation. No evidence of mitral stenosis. Moderate mitral annular calcification.  4. The aortic valve is tricuspid. Aortic valve regurgitation is not visualized. Mild to moderate aortic valve sclerosis/calcification is present, without any evidence of aortic stenosis.  5. Aortic dilatation noted. There is borderline dilatation of the ascending aorta, measuring 37 mm.  6. The inferior vena cava is dilated in size with >50% respiratory variability, suggesting right atrial pressure of 8 mmHg. FINDINGS  Left Ventricle: Left ventricular ejection fraction, by estimation, is 55 to 60%. Left ventricular ejection fraction by 3D volume is 55 %. The left ventricle has normal function. The left ventricle has no regional wall motion abnormalities. The left ventricular internal cavity size was normal in size. There is mild concentric left  ventricular hypertrophy. Left ventricular diastolic parameters are consistent with Grade I diastolic dysfunction (impaired relaxation). Normal left ventricular filling pressure. Right Ventricle: The right ventricular size is normal. No increase in right ventricular wall thickness. Right ventricular systolic function is normal. Tricuspid regurgitation signal is inadequate for assessing PA pressure. Left Atrium: Left atrial size was normal in size. Right Atrium: Right atrial size was normal in size. Pericardium: There is no evidence of pericardial effusion. Mitral Valve: The mitral valve is degenerative in appearance. There is mild thickening of the mitral valve leaflet(s). There is mild calcification of the mitral valve leaflet(s). Moderate mitral annular calcification. No evidence of mitral valve regurgitation. No evidence of mitral valve stenosis. Tricuspid Valve: The tricuspid valve is normal in structure. Tricuspid valve regurgitation is trivial. No evidence of tricuspid stenosis. Aortic Valve: The aortic valve is tricuspid. Aortic valve regurgitation is not visualized. Mild to moderate aortic valve sclerosis/calcification is present, without any evidence of aortic stenosis. Pulmonic Valve: The pulmonic valve was normal in structure. Pulmonic valve regurgitation is trivial. No evidence of pulmonic stenosis. Aorta: Aortic dilatation noted. There is borderline dilatation of the ascending aorta, measuring 37 mm. Venous: The inferior vena cava is dilated in size with greater than 50% respiratory variability, suggesting right atrial pressure of 8 mmHg. IAS/Shunts: No atrial level shunt detected by color flow Doppler.  LEFT VENTRICLE PLAX 2D LVIDd:         3.30 cm         Diastology LVIDs:         2.20 cm         LV e' medial:    4.90 cm/s LV PW:         1.30 cm         LV E/e' medial:  12.8 LV IVS:        1.20 cm         LV e' lateral:   6.31 cm/s LVOT diam:     2.00 cm         LV E/e' lateral: 9.9 LV SV:         54  LV SV Index:   33 LVOT Area:     3.14 cm        3D Volume EF  LV 3D EF:    Left                                             ventricular LV Volumes (MOD)                            ejection LV vol d, MOD    67.8 ml                    fraction by A2C:                                        3D volume LV vol d, MOD    80.6 ml                    is 55 %. A4C: LV vol s, MOD    23.4 ml A2C:                           3D Volume EF: LV vol s, MOD    32.4 ml       3D EF:        55 % A4C: LV SV MOD A2C:   44.4 ml LV SV MOD A4C:   80.6 ml LV SV MOD BP:    50.3 ml RIGHT VENTRICLE            IVC RV S prime:     9.03 cm/s  IVC diam: 2.10 cm TAPSE (M-mode): 2.0 cm LEFT ATRIUM             Index       RIGHT ATRIUM           Index LA diam:        3.30 cm 2.00 cm/m  RA Area:     12.50 cm LA Vol (A2C):   37.8 ml 22.89 ml/m RA Volume:   27.60 ml  16.72 ml/m LA Vol (A4C):   31.4 ml 19.02 ml/m LA Biplane Vol: 33.8 ml 20.47 ml/m  AORTIC VALVE LVOT Vmax:   84.20 cm/s LVOT Vmean:  61.600 cm/s LVOT VTI:    0.171 m  AORTA Ao Root diam: 3.60 cm Ao Asc diam:  3.70 cm MITRAL VALVE MV Area (PHT): 2.17 cm    SHUNTS MV Decel Time: 349 msec    Systemic VTI:  0.17 m MV E velocity: 62.60 cm/s  Systemic Diam: 2.00 cm MV A velocity: 96.00 cm/s MV E/A ratio:  0.65 Fransico Him MD Electronically signed by Fransico Him MD Signature Date/Time: 11/23/2020/10:21:21 AM    Final    (Echo, Carotid, EGD, Colonoscopy, ERCP)    Subjective: She has no new complaints her pain is controlled.  Discharge Exam: Vitals:   11/26/20 0838 11/26/20 0839  BP:  117/60  Pulse:  (!) 119  Resp:  18  Temp:  98.8 F (37.1 C)  SpO2: 91% 90%   Vitals:   11/25/20 1953 11/26/20 0454 11/26/20 0838 11/26/20 0839  BP: 122/70 (!) 101/54  117/60  Pulse: 69 63  (!) 119  Resp: 17 17  18   Temp: 99.2 F (37.3 C) 97.7 F (36.5 C)  98.8 F (37.1 C)  TempSrc:  Oral Oral  Oral  SpO2: 95% 100% 91% 90%  Weight:        General: Pt is  alert, awake, not in acute distress Cardiovascular: RRR, S1/S2 +, no rubs, no gallops Respiratory: CTA bilaterally, no wheezing, no rhonchi Abdominal: Soft, NT, ND, bowel sounds + Extremities: no edema, no cyanosis    The results of significant diagnostics from this hospitalization (including imaging, microbiology, ancillary and laboratory) are listed below for reference.     Microbiology: Recent Results (from the past 240 hour(s))  Culture, blood (single)     Status: None (Preliminary result)   Collection Time: 11/22/20  1:54 PM   Specimen: BLOOD  Result Value Ref Range Status   Specimen Description BLOOD SITE NOT SPECIFIED  Final   Special Requests   Final    BOTTLES DRAWN AEROBIC AND ANAEROBIC Blood Culture results may not be optimal due to an inadequate volume of blood received in culture bottles   Culture   Final    NO GROWTH 4 DAYS Performed at [facility], [place] [address], [place], [place] [address]    Report Status PENDING  Incomplete  Urine culture     Status: Abnormal   Collection Time: 11/22/20  3:52 PM   Specimen: Urine, Random  Result Value Ref Range Status   Specimen Description URINE, RANDOM  Final   Special Requests   Final    NONE Performed at [facility], [place] [address], [place], [place] [address]    Culture >=100,000 COLONIES/mL ESCHERICHIA COLI (A)  Final   Report Status 11/25/2020 FINAL  Final   Organism ID, Bacteria ESCHERICHIA COLI (A)  Final      Susceptibility   Escherichia coli - MIC*    AMPICILLIN <=2 SENSITIVE Sensitive     CEFAZOLIN <=4 SENSITIVE Sensitive     CEFEPIME <=0.12 SENSITIVE Sensitive     CEFTRIAXONE <=0.25 SENSITIVE Sensitive     CIPROFLOXACIN <=0.25 SENSITIVE Sensitive     GENTAMICIN <=1 SENSITIVE Sensitive     IMIPENEM <=0.25 SENSITIVE Sensitive     NITROFURANTOIN <=16 SENSITIVE Sensitive     TRIMETH/SULFA <=20 SENSITIVE Sensitive     AMPICILLIN/SULBACTAM <=2 SENSITIVE Sensitive     PIP/TAZO <=4 SENSITIVE  Sensitive     * >=100,000 COLONIES/mL ESCHERICHIA COLI  Resp Panel by RT-PCR (Flu A&B, Covid) Nasopharyngeal Swab     Status: None   Collection Time: 11/22/20  4:58 PM   Specimen: Nasopharyngeal Swab; Nasopharyngeal(NP) swabs in vial transport medium  Result Value Ref Range Status   SARS Coronavirus 2 by RT PCR NEGATIVE NEGATIVE Final    Comment: (NOTE) SARS-CoV-2 target nucleic acids are NOT DETECTED.  The SARS-CoV-2 RNA is generally detectable in upper respiratory specimens during the acute phase of infection. The lowest concentration of SARS-CoV-2 viral copies this assay can detect is 138 copies/mL. A negative result does not preclude SARS-Cov-2 infection and should not be used as the sole basis for treatment or other patient management decisions. A negative result may occur with  improper specimen collection/handling, submission of specimen other than nasopharyngeal swab, presence of viral mutation(s) within the areas targeted by this assay, and inadequate number of viral copies(<138 copies/mL). A negative result must be combined with clinical observations, patient history, and epidemiological information. The expected result is Negative.  Fact Sheet for Patients:  EntrepreneurPulse.com.au  Fact Sheet for Healthcare Providers:  IncredibleEmployment.be  This test is no t yet approved or cleared by the [place] and  has been authorized for detection and/or diagnosis of SARS-CoV-2 by FDA under an Emergency Use Authorization (EUA). This EUA will remain  in effect (meaning this test can be used) for the duration of the COVID-19 declaration under Section 564(b)(1) of the Act, 21 U.S.C.section 360bbb-3(b)(1), unless the authorization is terminated  or revoked sooner.       Influenza A by PCR NEGATIVE NEGATIVE Final   Influenza B by PCR NEGATIVE NEGATIVE Final    Comment: (NOTE) The Xpert Xpress SARS-CoV-2/FLU/RSV plus assay is  intended as an aid in the diagnosis of influenza from Nasopharyngeal swab specimens and should not be used as a sole basis for treatment. Nasal washings and aspirates are unacceptable for Xpert Xpress SARS-CoV-2/FLU/RSV testing.  Fact Sheet for Patients: EntrepreneurPulse.com.au  Fact Sheet for Healthcare Providers: IncredibleEmployment.be  This test is not yet approved or cleared by the [place] FDA and has been authorized for detection and/or diagnosis of SARS-CoV-2 by FDA under an Emergency Use Authorization (EUA). This EUA will remain in effect (meaning this test can be used) for the duration of the COVID-19 declaration under Section 564(b)(1) of the Act, 21 U.S.C. section 360bbb-3(b)(1), unless the authorization is terminated or revoked.  Performed at [facility], [place] [address] [place], [place] [address]      Labs: BNP (last 3 results) Recent Labs    11/22/20 1354  BNP 47.4   Basic Metabolic Panel: Recent Labs  Lab 11/22/20 1354 11/23/20 0042 11/24/20 0221  NA 138 137 137  K 4.6 3.6 4.6  CL 106 105 106  CO2 23 24 23   GLUCOSE 86 100* 106*  BUN 24* 25* 26*  CREATININE 1.23* 1.27* 1.37*  CALCIUM 9.5 8.9 8.4*  PHOS  --   --  3.4   Liver Function Tests: Recent Labs  Lab 11/22/20 1354 11/24/20 0221  AST 22  --   ALT 19  --   ALKPHOS 103  --   BILITOT 0.9  --   PROT 6.8  --   ALBUMIN 3.4* 2.5*   Recent Labs  Lab 11/23/20 0042  LIPASE 32   No results for input(s): AMMONIA in the last 168 hours. CBC: Recent Labs  Lab 11/22/20 1354 11/22/20 1901 11/23/20 0042 11/24/20 0221  WBC 5.7 5.9 5.9 6.9  NEUTROABS 4.5  --   --  5.2  HGB 12.5 11.4* 11.8* 9.6*  HCT 42.0 38.3 39.2 31.6*  MCV 102.2* 102.1* 100.0 99.7  PLT 257 232 307 263   Cardiac Enzymes: No results for input(s): CKTOTAL, CKMB, CKMBINDEX, TROPONINI in the last 168 hours. BNP: Invalid input(s): POCBNP CBG: No results for input(s):  GLUCAP in the last 168 hours. D-Dimer No results for input(s): DDIMER in the last 72 hours. Hgb A1c No results for input(s): HGBA1C in the last 72 hours. Lipid Profile No results for input(s): CHOL, HDL, LDLCALC, TRIG, CHOLHDL, LDLDIRECT in the last 72 hours. Thyroid function studies No results for input(s): TSH, T4TOTAL, T3FREE, THYROIDAB in the last 72 hours.  Invalid input(s): FREET3 Anemia work up No results for input(s): VITAMINB12, FOLATE, FERRITIN, TIBC, IRON, RETICCTPCT in the last 72 hours. Urinalysis    Component Value Date/Time   COLORURINE YELLOW 11/22/2020 1440   APPEARANCEUR HAZY (A) 11/22/2020 1440   LABSPEC 1.012 11/22/2020 1440   PHURINE 5.0 11/22/2020 1440   GLUCOSEU NEGATIVE 11/22/2020 1440   HGBUR NEGATIVE 11/22/2020 1440   BILIRUBINUR NEGATIVE 11/22/2020 1440   BILIRUBINUR negative 08/03/2020 [place] 11/22/2020 1440  PROTEINUR NEGATIVE 11/22/2020 1440   UROBILINOGEN 0.2 08/03/2020 1633   UROBILINOGEN 0.2 09/12/2014 2302   NITRITE POSITIVE (A) 11/22/2020 1440   LEUKOCYTESUR NEGATIVE 11/22/2020 1440   Sepsis Labs Invalid input(s): PROCALCITONIN,  WBC,  LACTICIDVEN Microbiology Recent Results (from the past 240 hour(s))  Culture, blood (single)     Status: None (Preliminary result)   Collection Time: 11/22/20  1:54 PM   Specimen: BLOOD  Result Value Ref Range Status   Specimen Description BLOOD SITE NOT SPECIFIED  Final   Special Requests   Final    BOTTLES DRAWN AEROBIC AND ANAEROBIC Blood Culture results may not be optimal due to an inadequate volume of blood received in culture bottles   Culture   Final    NO GROWTH 4 DAYS Performed at [facility], [place] [address] [place], [place] [address]    Report Status PENDING  Incomplete  Urine culture     Status: Abnormal   Collection Time: 11/22/20  3:52 PM   Specimen: Urine, Random  Result Value Ref Range Status   Specimen Description URINE, RANDOM  Final   Special Requests    Final    NONE Performed at [facility], [place] [address], [place], [place] [address]    Culture >=100,000 COLONIES/mL ESCHERICHIA COLI (A)  Final   Report Status 11/25/2020 FINAL  Final   Organism ID, Bacteria ESCHERICHIA COLI (A)  Final      Susceptibility   Escherichia coli - MIC*    AMPICILLIN <=2 SENSITIVE Sensitive     CEFAZOLIN <=4 SENSITIVE Sensitive     CEFEPIME <=0.12 SENSITIVE Sensitive     CEFTRIAXONE <=0.25 SENSITIVE Sensitive     CIPROFLOXACIN <=0.25 SENSITIVE Sensitive     GENTAMICIN <=1 SENSITIVE Sensitive     IMIPENEM <=0.25 SENSITIVE Sensitive     NITROFURANTOIN <=16 SENSITIVE Sensitive     TRIMETH/SULFA <=20 SENSITIVE Sensitive     AMPICILLIN/SULBACTAM <=2 SENSITIVE Sensitive     PIP/TAZO <=4 SENSITIVE Sensitive     * >=100,000 COLONIES/mL ESCHERICHIA COLI  Resp Panel by RT-PCR (Flu A&B, Covid) Nasopharyngeal Swab     Status: None   Collection Time: 11/22/20  4:58 PM   Specimen: Nasopharyngeal Swab; Nasopharyngeal(NP) swabs in vial transport medium  Result Value Ref Range Status   SARS Coronavirus 2 by RT PCR NEGATIVE NEGATIVE Final    Comment: (NOTE) SARS-CoV-2 target nucleic acids are NOT DETECTED.  The SARS-CoV-2 RNA is generally detectable in upper respiratory specimens during the acute phase of infection. The lowest concentration of SARS-CoV-2 viral copies this assay can detect is 138 copies/mL. A negative result does not preclude SARS-Cov-2 infection and should not be used as the sole basis for treatment or other patient management decisions. A negative result may occur with  improper specimen collection/handling, submission of specimen other than nasopharyngeal swab, presence of viral mutation(s) within the areas targeted by this assay, and inadequate number of viral copies(<138 copies/mL). A negative result must be combined with clinical observations, patient history, and epidemiological information. The expected result is Negative.  Fact Sheet  for Patients:  EntrepreneurPulse.com.au  Fact Sheet for Healthcare Providers:  IncredibleEmployment.be  This test is no t yet approved or cleared by the [place] FDA and  has been authorized for detection and/or diagnosis of SARS-CoV-2 by FDA under an Emergency Use Authorization (EUA). This EUA will remain  in effect (meaning this test can be used) for the duration of the COVID-19 declaration under Section 564(b)(1) of  the Act, 21 U.S.C.section 360bbb-3(b)(1), unless the authorization is terminated  or revoked sooner.       Influenza A by PCR NEGATIVE NEGATIVE Final   Influenza B by PCR NEGATIVE NEGATIVE Final    Comment: (NOTE) The Xpert Xpress SARS-CoV-2/FLU/RSV plus assay is intended as an aid in the diagnosis of influenza from Nasopharyngeal swab specimens and should not be used as a sole basis for treatment. Nasal washings and aspirates are unacceptable for Xpert Xpress SARS-CoV-2/FLU/RSV testing.  Fact Sheet for Patients: EntrepreneurPulse.com.au  Fact Sheet for Healthcare Providers: IncredibleEmployment.be  This test is not yet approved or cleared by the [place] FDA and has been authorized for detection and/or diagnosis of SARS-CoV-2 by FDA under an Emergency Use Authorization (EUA). This EUA will remain in effect (meaning this test can be used) for the duration of the COVID-19 declaration under Section 564(b)(1) of the Act, 21 U.S.C. section 360bbb-3(b)(1), unless the authorization is terminated or revoked.  Performed at [facility], [place] [address], [place], [place] [address]      SIGNED:   Charlynne Cousins, MD  [facility] 11/26/2020, 8:42 AM Pager   If 7PM-7AM, please contact night-coverage [email or web address] Password [email or web address]

## 2020-11-26 NOTE — Plan of Care (Signed)

## 2020-11-27 DIAGNOSIS — N182 Chronic kidney disease, stage 2 (mild): Secondary | ICD-10-CM | POA: Diagnosis not present

## 2020-11-27 DIAGNOSIS — G301 Alzheimer's disease with late onset: Secondary | ICD-10-CM | POA: Diagnosis not present

## 2020-11-27 DIAGNOSIS — E785 Hyperlipidemia, unspecified: Secondary | ICD-10-CM | POA: Diagnosis not present

## 2020-11-27 DIAGNOSIS — E440 Moderate protein-calorie malnutrition: Secondary | ICD-10-CM | POA: Diagnosis not present

## 2020-11-27 DIAGNOSIS — N1831 Chronic kidney disease, stage 3a: Secondary | ICD-10-CM | POA: Diagnosis not present

## 2020-11-27 DIAGNOSIS — N179 Acute kidney failure, unspecified: Secondary | ICD-10-CM | POA: Diagnosis not present

## 2020-11-27 DIAGNOSIS — Z743 Need for continuous supervision: Secondary | ICD-10-CM | POA: Diagnosis not present

## 2020-11-27 DIAGNOSIS — R296 Repeated falls: Secondary | ICD-10-CM | POA: Diagnosis not present

## 2020-11-27 DIAGNOSIS — C3491 Malignant neoplasm of unspecified part of right bronchus or lung: Secondary | ICD-10-CM | POA: Diagnosis not present

## 2020-11-27 DIAGNOSIS — I5032 Chronic diastolic (congestive) heart failure: Secondary | ICD-10-CM | POA: Diagnosis not present

## 2020-11-27 DIAGNOSIS — G894 Chronic pain syndrome: Secondary | ICD-10-CM | POA: Diagnosis not present

## 2020-11-27 DIAGNOSIS — C3490 Malignant neoplasm of unspecified part of unspecified bronchus or lung: Secondary | ICD-10-CM | POA: Diagnosis not present

## 2020-11-27 DIAGNOSIS — I5042 Chronic combined systolic (congestive) and diastolic (congestive) heart failure: Secondary | ICD-10-CM | POA: Diagnosis not present

## 2020-11-27 DIAGNOSIS — R2681 Unsteadiness on feet: Secondary | ICD-10-CM | POA: Diagnosis not present

## 2020-11-27 DIAGNOSIS — K219 Gastro-esophageal reflux disease without esophagitis: Secondary | ICD-10-CM | POA: Diagnosis not present

## 2020-11-27 DIAGNOSIS — W19XXXA Unspecified fall, initial encounter: Secondary | ICD-10-CM | POA: Diagnosis not present

## 2020-11-27 DIAGNOSIS — I5022 Chronic systolic (congestive) heart failure: Secondary | ICD-10-CM | POA: Diagnosis not present

## 2020-11-27 DIAGNOSIS — G4733 Obstructive sleep apnea (adult) (pediatric): Secondary | ICD-10-CM | POA: Diagnosis not present

## 2020-11-27 DIAGNOSIS — D6489 Other specified anemias: Secondary | ICD-10-CM | POA: Diagnosis not present

## 2020-11-27 DIAGNOSIS — R404 Transient alteration of awareness: Secondary | ICD-10-CM | POA: Diagnosis not present

## 2020-11-27 DIAGNOSIS — R531 Weakness: Secondary | ICD-10-CM | POA: Diagnosis not present

## 2020-11-27 DIAGNOSIS — W19XXXD Unspecified fall, subsequent encounter: Secondary | ICD-10-CM | POA: Diagnosis not present

## 2020-11-27 DIAGNOSIS — I2510 Atherosclerotic heart disease of native coronary artery without angina pectoris: Secondary | ICD-10-CM | POA: Diagnosis not present

## 2020-11-27 DIAGNOSIS — M6281 Muscle weakness (generalized): Secondary | ICD-10-CM | POA: Diagnosis not present

## 2020-11-27 DIAGNOSIS — G629 Polyneuropathy, unspecified: Secondary | ICD-10-CM | POA: Diagnosis not present

## 2020-11-27 DIAGNOSIS — R262 Difficulty in walking, not elsewhere classified: Secondary | ICD-10-CM | POA: Diagnosis not present

## 2020-11-27 DIAGNOSIS — I10 Essential (primary) hypertension: Secondary | ICD-10-CM | POA: Diagnosis not present

## 2020-11-27 DIAGNOSIS — I25118 Atherosclerotic heart disease of native coronary artery with other forms of angina pectoris: Secondary | ICD-10-CM | POA: Diagnosis not present

## 2020-11-27 DIAGNOSIS — R5383 Other fatigue: Secondary | ICD-10-CM | POA: Diagnosis not present

## 2020-11-27 DIAGNOSIS — N183 Chronic kidney disease, stage 3 unspecified: Secondary | ICD-10-CM | POA: Diagnosis not present

## 2020-11-27 DIAGNOSIS — I509 Heart failure, unspecified: Secondary | ICD-10-CM | POA: Diagnosis not present

## 2020-11-27 DIAGNOSIS — R1013 Epigastric pain: Secondary | ICD-10-CM | POA: Diagnosis not present

## 2020-11-27 DIAGNOSIS — R6889 Other general symptoms and signs: Secondary | ICD-10-CM | POA: Diagnosis not present

## 2020-11-27 DIAGNOSIS — J449 Chronic obstructive pulmonary disease, unspecified: Secondary | ICD-10-CM | POA: Diagnosis not present

## 2020-11-27 LAB — CULTURE, BLOOD (SINGLE): Culture: NO GROWTH

## 2020-11-27 NOTE — TOC Transition Note (Signed)
Transition of Care [facility]) - CM/SW Discharge Note   Patient Details  Name: Sara Fox MRN: [id number] Date of Birth: 1940-06-04  [facility] CM/SW Contact:  Emeterio Reeve, LCSW Phone Number: 11/27/2020, 10:31 AM   Clinical Narrative:     Patient will DC to: [facility] Anticipated DC date: 7/16 Family notified: Son Transport by: Corey Harold     Per MD patient ready for DC to [facility]. RN, patient, patient's family, and facility notified of DC. Discharge Summary and FL2 sent to facility. DC packet on chart. Ambulance transport requested for patient.    RN to call report to [phone].  CSW will sign off for now as social work intervention is no longer needed. Please consult [place] again if new needs arise.   Final next level of care: Skilled Nursing Facility Barriers to Discharge: Barriers Resolved   Patient Goals and CMS Choice Patient states their goals for this hospitalization and ongoing recovery are:: to get stronger CMS Medicare.gov Compare Post Acute Care list provided to:: Patient Choice offered to / list presented to : Patient  Discharge Placement              Patient chooses bed at: [facility] Patient to be transferred to facility by: [facility] Name of family member notified: Son Patient and family notified of of transfer: 11/27/20  Discharge Plan and Services                                     Social Determinants of Health (SDOH) Interventions     Readmission Risk Interventions No flowsheet data found.  Emeterio Reeve, Latanya Presser, [place] Social Worker [phone]

## 2020-11-27 NOTE — Progress Notes (Signed)
Paged Dr Tyrell Antonio to come sign patients DNR form per Dr Aileen Fass.  [facility] here waiting so that they can take pt

## 2020-11-27 NOTE — Discharge Summary (Signed)
Physician Discharge Summary  Sara Fox KNL:[id number] DOB: 12-15-1940 DOA: 11/22/2020  PCP: [name], FNP  Admit date: 11/22/2020 Discharge date: 11/27/2020  Admitted From: Home Disposition:  SNF  Recommendations for Outpatient Follow-up:  Follow up with PCP in 1-2 weeks Please obtain BMP/CBC in one week   Home Health:no Equipment/Devices:None  Discharge Condition:stable CODE STATUS:DNR Diet recommendation: Heart Healthy  Brief/Interim Summary: 80 y.o. 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   Significant studies: 11/22/2020 CT scan of the head and C-spine showed no acute findings. 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 coli sensitive to Keflex she has completed 5-day course in-house. CT scan of the chest showed a T9 compression fracture which is probably subacute. She was continued on analgesics which works for her pain. Physical therapy evaluated the patient and recommended skilled nursing facility.  Chronic systolic and diastolic heart failure: She  appears euvolemic on physical exam no changes made to her medication.  Lower extremity pain and swelling: Lower extremity Doppler was negative for DVT. Naproxen was discontinued from her regimen and she was started on tramadol given ibuprofen which controlled her pain.  Stage I non-small cell carcinoma of the lung: Will need to follow-up with oncology as an outpatient.  Possible UTI: She has completed her course of antibiotics in house.  Lewy body dementia: Continue Aricept and Namenda and Seroquel.   Nchanges made to her medication.  Essential hypertension: No changes made to her medication.  Stage II sacral decubitus ulcer present on admission:  Discharge Instructions  Discharge Instructions     Diet - low sodium heart healthy   Complete by: As directed    Diet - low sodium heart healthy   Complete by: As directed    Increase activity slowly   Complete by: As directed    Increase activity slowly   Complete by: As directed       Allergies as of 11/27/2020       Reactions   Buprenorphine Hcl Shortness Of Breath   Throat swelling/trouble breathing and lethargic   Morphine And Related Shortness Of Breath   Throat swelling/trouble breathing and lethargic   Celebrex [celecoxib] Diarrhea, Swelling   Swelling of legs    Vioxx [rofecoxib] Palpitations   Codeine Other (See Comments)   Bloated         Medication List     TAKE these medications    aspirin EC 81 MG tablet Take 81 mg by mouth daily as needed (headache).   CALCIUM CITRATE+D3 PETITES PO Take 1 tablet by mouth daily.   cholecalciferol 25 MCG (1000 UNIT) tablet Commonly known as: VITAMIN D  Take 1,000 Units by mouth daily.   ciclopirox 8 % solution Commonly known as: Penlac Apply 1 application topically at bedtime. Apply over nail and surrounding skin. Apply daily over previous coat. After seven (7) days, file nail and continue cycle.   [employer] Magnesium Oxide 250 MG Tabs Generic drug: Magnesium  Oxide -Mg Supplement TAKE 1 TABLET BY MOUTH WITH EVENING MEAL DAILY What changed: See the new instructions.   donepezil 10 MG tablet Commonly known as: ARICEPT Take 1 tablet (10 mg total) by mouth at bedtime.   ferrous sulfate 325 (65 FE) MG tablet Take 325 mg by mouth daily with breakfast.   furosemide 20 MG tablet Commonly known as: LASIX TAKE 1 TABLET BY MOUTH EVERY OTHER DAY What changed:  when to take this additional instructions   gabapentin 100 MG capsule Commonly known as: NEURONTIN Take 1 capsule (100 mg total) by mouth 2 (two) times daily.   ibuprofen 200 MG tablet Commonly known as: ADVIL Take 400 mg by mouth every 6 (six) hours as needed (pain).   memantine 5 MG tablet Commonly known as: Namenda Take 1 tablet (5 mg total) by mouth daily.   metoprolol tartrate 25 MG tablet Commonly known as: LOPRESSOR TAKE 1 TABLET BY MOUTH TWICE A DAY   multivitamin with minerals Tabs tablet Take 1 tablet by mouth daily.   nitroGLYCERIN 0.4 MG SL tablet Commonly known as: NITROSTAT PLACE ONE TABLET UNDER THE TONGUE EVERY 5 MINUTES AS NEEDED FOR CHEST PAIN. What changed:  how much to take how to take this when to take this reasons to take this additional instructions   oxybutynin 5 MG tablet Commonly known as: DITROPAN Take 0.5 tablets (2.5 mg total) by mouth 2 (two) times daily.   pantoprazole 40 MG tablet Commonly known as: PROTONIX Take 1 tablet (40 mg total) by mouth 2 (two) times daily.   polyethylene glycol 17 g packet Commonly known as: MIRALAX / GLYCOLAX Take 17 g by mouth daily as needed for mild constipation.   Potassium Chloride ER 20 MEQ Tbcr TAKE 2 TABLETS BY MOUTH EVERY MORNING What changed:  how much to take when to take this   QUEtiapine 50 MG tablet Commonly known as: SEROQUEL TAKE 1 TABLET BY MOUTH EVERYDAY AT BEDTIME What changed:  how much to take how to take this when to take this additional instructions   rosuvastatin 40 MG  tablet Commonly known as: CRESTOR TAKE 1 TABLET BY MOUTH ONCE DAILY   Symbicort 160-4.5 MCG/ACT inhaler Generic drug: budesonide-formoterol TAKE 2 PUFFS BY MOUTH TWICE A DAY What changed: See the new instructions.   traMADol 50 MG tablet Commonly known as: ULTRAM TAKE 1 TABLET 3 TIMES A DAY AND CAN TAKE 1 EXTRA 20 DAYS/MONTH What changed: See the new instructions.        Allergies  Allergen Reactions   Buprenorphine Hcl Shortness Of Breath    Throat swelling/trouble breathing and lethargic   Morphine And Related Shortness Of Breath    Throat swelling/trouble breathing and lethargic   Celebrex [Celecoxib] Diarrhea and Swelling    Swelling of legs    Vioxx [Rofecoxib] Palpitations   Codeine Other (See Comments)    Bloated     Consultations: None   Procedures/Studies: DG Chest 1 View  Result Date: 11/22/2020 CLINICAL DATA:  Fall today EXAM: CHEST  1 VIEW COMPARISON:  CT chest 10/28/2020 and chest radiograph 05/25/2020 FINDINGS: The patient is rotated to the right on today's radiograph, reducing diagnostic sensitivity and specificity.  Hazy vascular prominence in the lung apices is indeterminate due to the supine positioning. There is a suggestion of mild interstitial accentuation in the lung apices and potentially some faint airspace opacity at the right lung apex although the rightward rotation makes this a difficult distinction. Atherosclerotic calcification of the aortic arch. Right proximal humeral prosthesis. No pneumothorax. No blunting of the costophrenic angles. Heart size within normal limits. IMPRESSION: 1. Indistinct opacity at the right lung apex possibly artifactual given the degree of rightward rotation and upper zone pulmonary vascular prominence. Small amount of airspace opacity in this vicinity is difficult to exclude. This could be from contusion, atelectasis, or pneumonia. Chest CT could be used to further workup if clinically warranted. 2.  Atherosclerotic  calcification of the aortic arch. Electronically Signed   By: Van Clines M.D.   On: 11/22/2020 15:20   DG Thoracic Spine 2 View  Result Date: 11/22/2020 CLINICAL DATA:  Fall.  Pain. EXAM: THORACIC SPINE 2 VIEWS COMPARISON:  Chest CT 10/28/2020. FINDINGS: Study limited by marked osteopenia. T9 compression fracture is stable since chest CT of 10/28/2020. No other definite thoracic spine compression fracture evident although again, assessment is limited by the demineralization. Frontal film shows no abnormal paraspinal line. Upper thoracic spine is largely obscured on the lateral projection. IMPRESSION: Stable T9 compression fracture. No other definite thoracic spine fracture evident although assessment is limited by demineralization and obscuration of the upper thoracic spine on the lateral projection. If there is high clinical index of concern, CT imaging may be warranted. Electronically Signed   By: Misty Stanley M.D.   On: 11/22/2020 15:05   DG Pelvis 1-2 Views  Result Date: 11/22/2020 CLINICAL DATA:  Fall EXAM: PELVIS - 1-2 VIEW COMPARISON:  CT data from PET-CT dated 06/21/2020 FINDINGS: Posterolateral rod and pedicle screw fixation in the lower lumbar spine and in the S1 level of the sacrum, with the S1 pedicle screws having surrounding lucency which could imply loosening or infection, but not appreciably changed from 06/21/2020. Bony demineralization is present. Mild sclerosis along the SI joints. Mild to moderate axial loss of articular space in both hips. No compelling findings of fracture. IMPRESSION: 1. No pelvic fracture identified. If the patient is unable to bear weight then cross-sectional imaging may be warranted. 2. Lucency around the S1 pedicle screws as on the prior exam from February, potentially from loosening or infection. 3. Bony demineralization. Electronically Signed   By: Van Clines M.D.   On: 11/22/2020 15:17   DG Elbow Complete Left  Result Date:  11/22/2020 CLINICAL DATA:  Fall, elbow pain EXAM: LEFT ELBOW - COMPLETE 3+ VIEW COMPARISON:  Forearm radiographs of 05/27/2017 FINDINGS: Mild spurring along the epicondyles of the distal humerus. No elbow fracture or malalignment identified. IMPRESSION: 1. No acute bony findings. Electronically Signed   By: Van Clines M.D.   On: 11/22/2020 15:14   CT Head Wo Contrast  Result Date: 11/22/2020 CLINICAL DATA:  Unwitnessed fall.  Dementia. EXAM: CT HEAD WITHOUT CONTRAST TECHNIQUE: Contiguous axial images were obtained from the base of the skull through the vertex without intravenous contrast. COMPARISON:  06/21/2020 MRI brain FINDINGS: Brain: The brainstem, cerebellum, cerebral peduncles, thalami, basal ganglia, basilar cisterns, and ventricular system appear within normal limits. Periventricular white matter and corona radiata hypodensities favor chronic ischemic microvascular white matter disease. No intracranial hemorrhage, mass lesion, or acute CVA. Vascular: There is atherosclerotic calcification of the cavernous carotid arteries bilaterally. Skull: Unremarkable Sinuses/Orbits: Mild chronic left ethmoid sinusitis. Other: Small  left pre-auricular subcutaneous lesion, probably a sebaceous cyst or similar benign lesion, not appreciably changed from 06/21/2020. IMPRESSION: 1. No acute intracranial findings. 2. Periventricular white matter and corona radiata hypodensities favor chronic ischemic microvascular white matter disease. 3. Mild chronic left ethmoid sinusitis. Electronically Signed   By: Van Clines M.D.   On: 11/22/2020 15:26   CT CHEST WO CONTRAST  Result Date: 10/28/2020 CLINICAL DATA:  Non-small cell lung cancer restaging EXAM: CT CHEST WITHOUT CONTRAST TECHNIQUE: Multidetector CT imaging of the chest was performed following the standard protocol without IV contrast. COMPARISON:  PET-CT, 06/21/2020 FINDINGS: Cardiovascular: Aortic atherosclerosis. Normal heart size. Three-vessel  coronary artery calcifications and stents. No pericardial effusion. Mediastinum/Nodes: No enlarged mediastinal, hilar, or axillary lymph nodes. Thyroid gland, trachea, and esophagus demonstrate no significant findings. Lungs/Pleura: Mild centrilobular and paraseptal emphysema. No significant change in an irregular, heterogeneous lesion of the posterior right pulmonary apex, measuring approximately 3.4 x 3.6 cm in total extent (series 7, image 29). Additional sub solid nodule of the superior segment left lower lobe is unchanged, measuring approximately 6 mm (series 7, image 32) no pleural effusion or pneumothorax. Upper Abdomen: No acute abnormality. Musculoskeletal: No chest wall mass or suspicious bone lesions identified. IMPRESSION: 1. No significant change in an irregular, heterogeneous lesion of the posterior right pulmonary apex, measuring approximately 3.4 x 3.6 cm in total extent. Although generally somewhat infectious or inflammatory in appearance, this represents a biopsy proven malignancy. 2. Unchanged nonspecific 6 mm subsolid nodule of the superior segment left lower lobe. Continued attention on follow-up. 3. No evidence of lymphadenopathy or metastatic disease in the chest. 4. Emphysema. 5. Coronary artery disease. Aortic Atherosclerosis (ICD10-I70.0) and Emphysema (ICD10-J43.9). Electronically Signed   By: Eddie Candle M.D.   On: 10/28/2020 16:54   CT Cervical Spine Wo Contrast  Result Date: 11/22/2020 CLINICAL DATA:  Unwitnessed fall with multifocal pain and history of dementia. EXAM: CT CERVICAL SPINE WITHOUT CONTRAST TECHNIQUE: Multidetector CT imaging of the cervical spine was performed without intravenous contrast. Multiplanar CT image reconstructions were also generated. COMPARISON:  06/26/2013 radiographs FINDINGS: Alignment: The C6-7 level is fused with about 2-3 mm of anterior subluxation at the fused level. No acute subluxation identified. Skull base and vertebrae: Erosions along the base  of the odontoid with posterior pannus noted. Erosions along the anterior ring of C1 primarily in the adjacent lateral masses. Sclerosis in the right lateral mass of C1. No acute fracture or acute bony findings identified. Corpectomy at C4 with ACDF at C3-C5. Fused bilateral facet joints at C3-4 and C6-7. Interbody fusion at C6-7. Soft tissues and spinal canal: Atherosclerotic calcification of both common carotid arteries. Disc levels: Intervertebral and facet spurring contribute to osseous foraminal stenosis on the right at C3-4, C4-5, C5-6, and C6-7; and on the left at C2-3, C4-5, C5-6, and C6-7. Upper chest: Posteriorly in the right lung apex we partially include the biopsy-proven non-small cell lung cancer. Other: No supplemental non-categorized findings. IMPRESSION: 1. No acute cervical spine findings. 2. Multilevel postoperative findings with fusion and corpectomy is noted above. Multilevel impingement. 3. Posteriorly in the apical segment right upper lobe we partially include the biopsy-proven non-small cell lung cancer. Electronically Signed   By: Van Clines M.D.   On: 11/22/2020 15:33   DG Shoulder Left  Result Date: 11/22/2020 CLINICAL DATA:  Fall, shoulder pain EXAM: LEFT SHOULDER - 2+ VIEW COMPARISON:  Humerus radiographs from 01/21/2006 FINDINGS: Humeral head is superiorly subluxed with respect to the glenoid but not overtly  dislocated. Severe degenerative glenohumeral arthropathy with subcortical sclerosis, spurring, and some flattening of the contour of the glenoid. No discrete fracture identified. Thoracic spondylosis incidentally noted. IMPRESSION: 1. Severe degenerative glenohumeral arthropathy with superior subluxation of the humeral head but no overt dislocation. Electronically Signed   By: Van Clines M.D.   On: 11/22/2020 15:12   DG Foot Complete Left  Result Date: 11/22/2020 CLINICAL DATA:  Fall.  Leg swelling EXAM: LEFT FOOT - COMPLETE 3+ VIEW COMPARISON:  None.  FINDINGS: Truncated head of the proximal phalanx of the small toe with absent middle phalanx of the small toe, correlate with patient history. Shortened middle phalanx of the fourth toe. No malalignment along the Lisfranc joint. Bony demineralization is present. No acute fracture is identified. Probable mild dorsal subcutaneous edema along the forefoot. IMPRESSION: 1. Truncated distal head of the proximal phalanx small toe in absent middle phalanx, correlate with patient history. I do not see an acute fracture. 2. Shortened middle phalanx of the fourth toe. 3. Bony demineralization. Electronically Signed   By: Van Clines M.D.   On: 11/22/2020 15:06   DG Foot Complete Right  Result Date: 11/22/2020 CLINICAL DATA:  Fall.  Leg swelling EXAM: RIGHT FOOT COMPLETE - 3+ VIEW COMPARISON:  None. FINDINGS: Abnormal appearance of the small toe with absent middle phalanx, small residual ossicle of the distal phalanx, add absent shaft and distal head of the proximal phalanx. Correlate with patient history. Mild hallux valgus. No malalignment at the Lisfranc joint. Bony demineralization. Small plantar and Achilles calcaneal spurs. Dorsal subcutaneous edema in the forefoot. I do not see an acute fracture. IMPRESSION: 1. Deformities in the small toe as noted above. 2. Bony demineralization. 3. No acute fracture identified. 4. Soft tissue swelling in the dorsum of the foot. 5. Calcaneal spurs. Electronically Signed   By: Van Clines M.D.   On: 11/22/2020 15:08   ECHOCARDIOGRAM COMPLETE  Result Date: 11/23/2020    ECHOCARDIOGRAM REPORT   Patient Name:   AMELIE CARACCI Date of Exam: 11/23/2020 Medical Rec #:  [id number]      Height:       65.0 in Accession #:    [id number]     Weight:       130.7 lb Date of Birth:  Mar 20, 1941      BSA:          1.651 m Patient Age:    39 years       BP:           135/71 mmHg Patient Gender: F              HR:           63 bpm. Exam Location:  Inpatient Procedure: 2D Echo, 3D Echo,  Cardiac Doppler and Color Doppler Indications:    I50.40* Unspecified combined systolic (congestive) and diastolic                 (congestive) heart failure  History:        Patient has prior history of Echocardiogram examinations, most                 recent 07/12/2017. Cardiomyopathy, CAD and Previous Myocardial                 Infarction, Signs/Symptoms:Altered Mental Status; Risk                 Factors:Hypertension, Dyslipidemia and Former Smoker.  Sonographer:    [place] Referring  Phys: [id number] Orma Flaming  Sonographer Comments: Technically difficult study due to poor echo windows. IMPRESSIONS  1. Left ventricular ejection fraction, by estimation, is 55 to 60%. Left ventricular ejection fraction by 3D volume is 55 %. The left ventricle has normal function. The left ventricle has no regional wall motion abnormalities. There is mild concentric left ventricular hypertrophy. Left ventricular diastolic parameters are consistent with Grade I diastolic dysfunction (impaired relaxation).  2. Right ventricular systolic function is normal. The right ventricular size is normal. Tricuspid regurgitation signal is inadequate for assessing PA pressure.  3. The mitral valve is degenerative. No evidence of mitral valve regurgitation. No evidence of mitral stenosis. Moderate mitral annular calcification.  4. The aortic valve is tricuspid. Aortic valve regurgitation is not visualized. Mild to moderate aortic valve sclerosis/calcification is present, without any evidence of aortic stenosis.  5. Aortic dilatation noted. There is borderline dilatation of the ascending aorta, measuring 37 mm.  6. The inferior vena cava is dilated in size with >50% respiratory variability, suggesting right atrial pressure of 8 mmHg. FINDINGS  Left Ventricle: Left ventricular ejection fraction, by estimation, is 55 to 60%. Left ventricular ejection fraction by 3D volume is 55 %. The left ventricle has normal function. The left ventricle has  no regional wall motion abnormalities. The left ventricular internal cavity size was normal in size. There is mild concentric left ventricular hypertrophy. Left ventricular diastolic parameters are consistent with Grade I diastolic dysfunction (impaired relaxation). Normal left ventricular filling pressure. Right Ventricle: The right ventricular size is normal. No increase in right ventricular wall thickness. Right ventricular systolic function is normal. Tricuspid regurgitation signal is inadequate for assessing PA pressure. Left Atrium: Left atrial size was normal in size. Right Atrium: Right atrial size was normal in size. Pericardium: There is no evidence of pericardial effusion. Mitral Valve: The mitral valve is degenerative in appearance. There is mild thickening of the mitral valve leaflet(s). There is mild calcification of the mitral valve leaflet(s). Moderate mitral annular calcification. No evidence of mitral valve regurgitation. No evidence of mitral valve stenosis. Tricuspid Valve: The tricuspid valve is normal in structure. Tricuspid valve regurgitation is trivial. No evidence of tricuspid stenosis. Aortic Valve: The aortic valve is tricuspid. Aortic valve regurgitation is not visualized. Mild to moderate aortic valve sclerosis/calcification is present, without any evidence of aortic stenosis. Pulmonic Valve: The pulmonic valve was normal in structure. Pulmonic valve regurgitation is trivial. No evidence of pulmonic stenosis. Aorta: Aortic dilatation noted. There is borderline dilatation of the ascending aorta, measuring 37 mm. Venous: The inferior vena cava is dilated in size with greater than 50% respiratory variability, suggesting right atrial pressure of 8 mmHg. IAS/Shunts: No atrial level shunt detected by color flow Doppler.  LEFT VENTRICLE PLAX 2D LVIDd:         3.30 cm         Diastology LVIDs:         2.20 cm         LV e' medial:    4.90 cm/s LV PW:         1.30 cm         LV E/e' medial:   12.8 LV IVS:        1.20 cm         LV e' lateral:   6.31 cm/s LVOT diam:     2.00 cm         LV E/e' lateral: 9.9 LV SV:  54 LV SV Index:   33 LVOT Area:     3.14 cm        3D Volume EF                                LV 3D EF:    Left                                             ventricular LV Volumes (MOD)                            ejection LV vol d, MOD    67.8 ml                    fraction by A2C:                                        3D volume LV vol d, MOD    80.6 ml                    is 55 %. A4C: LV vol s, MOD    23.4 ml A2C:                           3D Volume EF: LV vol s, MOD    32.4 ml       3D EF:        55 % A4C: LV SV MOD A2C:   44.4 ml LV SV MOD A4C:   80.6 ml LV SV MOD BP:    50.3 ml RIGHT VENTRICLE            IVC RV S prime:     9.03 cm/s  IVC diam: 2.10 cm TAPSE (M-mode): 2.0 cm LEFT ATRIUM             Index       RIGHT ATRIUM           Index LA diam:        3.30 cm 2.00 cm/m  RA Area:     12.50 cm LA Vol (A2C):   37.8 ml 22.89 ml/m RA Volume:   27.60 ml  16.72 ml/m LA Vol (A4C):   31.4 ml 19.02 ml/m LA Biplane Vol: 33.8 ml 20.47 ml/m  AORTIC VALVE LVOT Vmax:   84.20 cm/s LVOT Vmean:  61.600 cm/s LVOT VTI:    0.171 m  AORTA Ao Root diam: 3.60 cm Ao Asc diam:  3.70 cm MITRAL VALVE MV Area (PHT): 2.17 cm    SHUNTS MV Decel Time: 349 msec    Systemic VTI:  0.17 m MV E velocity: 62.60 cm/s  Systemic Diam: 2.00 cm MV A velocity: 96.00 cm/s MV E/A ratio:  0.65 Fransico Him MD Electronically signed by Fransico Him MD Signature Date/Time: 11/23/2020/10:21:21 AM    Final    (Echo, Carotid, EGD, Colonoscopy, ERCP)    Subjective: She has no new complaints her pain is controlled.  Discharge Exam: Vitals:   11/27/20 0750 11/27/20 0801  BP:  115/74  Pulse: 68 65  Resp: 18 16  Temp:  98.5 F (36.9  C)  SpO2: 98% 100%   Vitals:   11/27/20 0319 11/27/20 0407 11/27/20 0750 11/27/20 0801  BP:  96/72  115/74  Pulse:  61 68 65  Resp:  17 18 16   Temp:  98 F (36.7 C)  98.5 F  (36.9 C)  TempSrc:  Oral  Oral  SpO2:  99% 98% 100%  Weight: 59.8 kg       General: Pt is alert, awake, not in acute distress Cardiovascular: RRR, S1/S2 +, no rubs, no gallops Respiratory: CTA bilaterally, no wheezing, no rhonchi Abdominal: Soft, NT, ND, bowel sounds + Extremities: no edema, no cyanosis    The results of significant diagnostics from this hospitalization (including imaging, microbiology, ancillary and laboratory) are listed below for reference.     Microbiology: Recent Results (from the past 240 hour(s))  Culture, blood (single)     Status: None (Preliminary result)   Collection Time: 11/22/20  1:54 PM   Specimen: BLOOD  Result Value Ref Range Status   Specimen Description BLOOD SITE NOT SPECIFIED  Final   Special Requests   Final    BOTTLES DRAWN AEROBIC AND ANAEROBIC Blood Culture results may not be optimal due to an inadequate volume of blood received in culture bottles   Culture   Final    NO GROWTH 4 DAYS Performed at [facility] Lab, [place] [address] [place], [place] [address]    Report Status PENDING  Incomplete  Urine culture     Status: Abnormal   Collection Time: 11/22/20  3:52 PM   Specimen: Urine, Random  Result Value Ref Range Status   Specimen Description URINE, RANDOM  Final   Special Requests   Final    NONE Performed at [facility], [place] [address] [place], [place] [address]    Culture >=100,000 COLONIES/mL ESCHERICHIA COLI (A)  Final   Report Status 11/25/2020 FINAL  Final   Organism ID, Bacteria ESCHERICHIA COLI (A)  Final      Susceptibility   Escherichia coli - MIC*    AMPICILLIN <=2 SENSITIVE Sensitive     CEFAZOLIN <=4 SENSITIVE Sensitive     CEFEPIME <=0.12 SENSITIVE Sensitive     CEFTRIAXONE <=0.25 SENSITIVE Sensitive     CIPROFLOXACIN <=0.25 SENSITIVE Sensitive     GENTAMICIN <=1 SENSITIVE Sensitive     IMIPENEM <=0.25 SENSITIVE Sensitive     NITROFURANTOIN <=16 SENSITIVE Sensitive     TRIMETH/SULFA <=20  SENSITIVE Sensitive     AMPICILLIN/SULBACTAM <=2 SENSITIVE Sensitive     PIP/TAZO <=4 SENSITIVE Sensitive     * >=100,000 COLONIES/mL ESCHERICHIA COLI  Resp Panel by RT-PCR (Flu A&B, Covid) Nasopharyngeal Swab     Status: None   Collection Time: 11/22/20  4:58 PM   Specimen: Nasopharyngeal Swab; Nasopharyngeal(NP) swabs in vial transport medium  Result Value Ref Range Status   SARS Coronavirus 2 by RT PCR NEGATIVE NEGATIVE Final    Comment: (NOTE) SARS-CoV-2 target nucleic acids are NOT DETECTED.  The SARS-CoV-2 RNA is generally detectable in upper respiratory specimens during the acute phase of infection. The lowest concentration of SARS-CoV-2 viral copies this assay can detect is 138 copies/mL. A negative result does not preclude SARS-Cov-2 infection and should not be used as the sole basis for treatment or other patient management decisions. A negative result may occur with  improper specimen collection/handling, submission of specimen other than nasopharyngeal swab, presence of viral mutation(s) within the areas targeted by this assay, and inadequate number of viral copies(<138 copies/mL).  A negative result must be combined with clinical observations, patient history, and epidemiological information. The expected result is Negative.  Fact Sheet for Patients:  EntrepreneurPulse.com.au  Fact Sheet for Healthcare Providers:  IncredibleEmployment.be  This test is no t yet approved or cleared by the [place] FDA and  has been authorized for detection and/or diagnosis of SARS-CoV-2 by FDA under an Emergency Use Authorization (EUA). This EUA will remain  in effect (meaning this test can be used) for the duration of the COVID-19 declaration under Section 564(b)(1) of the Act, 21 U.S.C.section 360bbb-3(b)(1), unless the authorization is terminated  or revoked sooner.       Influenza A by PCR NEGATIVE NEGATIVE Final   Influenza B by PCR  NEGATIVE NEGATIVE Final    Comment: (NOTE) The Xpert Xpress SARS-CoV-2/FLU/RSV plus assay is intended as an aid in the diagnosis of influenza from Nasopharyngeal swab specimens and should not be used as a sole basis for treatment. Nasal washings and aspirates are unacceptable for Xpert Xpress SARS-CoV-2/FLU/RSV testing.  Fact Sheet for Patients: EntrepreneurPulse.com.au  Fact Sheet for Healthcare Providers: IncredibleEmployment.be  This test is not yet approved or cleared by the [place] FDA and has been authorized for detection and/or diagnosis of SARS-CoV-2 by FDA under an Emergency Use Authorization (EUA). This EUA will remain in effect (meaning this test can be used) for the duration of the COVID-19 declaration under Section 564(b)(1) of the Act, 21 U.S.C. section 360bbb-3(b)(1), unless the authorization is terminated or revoked.  Performed at [facility], [place] [address], [place], [place] [address]   SARS CORONAVIRUS 2 (TAT 6-24 HRS) Nasopharyngeal Nasopharyngeal Swab     Status: None   Collection Time: 11/26/20  9:31 AM   Specimen: Nasopharyngeal Swab  Result Value Ref Range Status   SARS Coronavirus 2 NEGATIVE NEGATIVE Final    Comment: (NOTE) SARS-CoV-2 target nucleic acids are NOT DETECTED.  The SARS-CoV-2 RNA is generally detectable in upper and lower respiratory specimens during the acute phase of infection. Negative results do not preclude SARS-CoV-2 infection, do not rule out co-infections with other pathogens, and should not be used as the sole basis for treatment or other patient management decisions. Negative results must be combined with clinical observations, patient history, and epidemiological information. The expected result is Negative.  Fact Sheet for Patients: SugarRoll.be  Fact Sheet for Healthcare Providers: https://www.woods-mathews.com/  This test is not  yet approved or cleared by the [place] FDA and  has been authorized for detection and/or diagnosis of SARS-CoV-2 by FDA under an Emergency Use Authorization (EUA). This EUA will remain  in effect (meaning this test can be used) for the duration of the COVID-19 declaration under Se ction 564(b)(1) of the Act, 21 U.S.C. section 360bbb-3(b)(1), unless the authorization is terminated or revoked sooner.  Performed at [facility], [place] [address], [place], [place] [address]      Labs: BNP (last 3 results) Recent Labs    11/22/20 1354  BNP 19.6   Basic Metabolic Panel: Recent Labs  Lab 11/22/20 1354 11/23/20 0042 11/24/20 0221  NA 138 137 137  K 4.6 3.6 4.6  CL 106 105 106  CO2 23 24 23   GLUCOSE 86 100* 106*  BUN 24* 25* 26*  CREATININE 1.23* 1.27* 1.37*  CALCIUM 9.5 8.9 8.4*  PHOS  --   --  3.4   Liver Function Tests: Recent Labs  Lab 11/22/20 1354 11/24/20 0221  AST 22  --   ALT  19  --   ALKPHOS 103  --   BILITOT 0.9  --   PROT 6.8  --   ALBUMIN 3.4* 2.5*   Recent Labs  Lab 11/23/20 0042  LIPASE 32   No results for input(s): AMMONIA in the last 168 hours. CBC: Recent Labs  Lab 11/22/20 1354 11/22/20 1901 11/23/20 0042 11/24/20 0221  WBC 5.7 5.9 5.9 6.9  NEUTROABS 4.5  --   --  5.2  HGB 12.5 11.4* 11.8* 9.6*  HCT 42.0 38.3 39.2 31.6*  MCV 102.2* 102.1* 100.0 99.7  PLT 257 232 307 263   Cardiac Enzymes: No results for input(s): CKTOTAL, CKMB, CKMBINDEX, TROPONINI in the last 168 hours. BNP: Invalid input(s): POCBNP CBG: No results for input(s): GLUCAP in the last 168 hours. D-Dimer No results for input(s): DDIMER in the last 72 hours. Hgb A1c No results for input(s): HGBA1C in the last 72 hours. Lipid Profile No results for input(s): CHOL, HDL, LDLCALC, TRIG, CHOLHDL, LDLDIRECT in the last 72 hours. Thyroid function studies No results for input(s): TSH, T4TOTAL, T3FREE, THYROIDAB in the last 72 hours.  Invalid input(s):  FREET3 Anemia work up No results for input(s): VITAMINB12, FOLATE, FERRITIN, TIBC, IRON, RETICCTPCT in the last 72 hours. Urinalysis    Component Value Date/Time   COLORURINE YELLOW 11/22/2020 1440   APPEARANCEUR HAZY (A) 11/22/2020 1440   LABSPEC 1.012 11/22/2020 1440   PHURINE 5.0 11/22/2020 1440   GLUCOSEU NEGATIVE 11/22/2020 1440   HGBUR NEGATIVE 11/22/2020 1440   BILIRUBINUR NEGATIVE 11/22/2020 1440   BILIRUBINUR negative 08/03/2020 1633   KETONESUR NEGATIVE 11/22/2020 1440   PROTEINUR NEGATIVE 11/22/2020 1440   UROBILINOGEN 0.2 08/03/2020 1633   UROBILINOGEN 0.2 09/12/2014 2302   NITRITE POSITIVE (A) 11/22/2020 1440   LEUKOCYTESUR NEGATIVE 11/22/2020 1440   Sepsis Labs Invalid input(s): PROCALCITONIN,  WBC,  LACTICIDVEN Microbiology Recent Results (from the past 240 hour(s))  Culture, blood (single)     Status: None (Preliminary result)   Collection Time: 11/22/20  1:54 PM   Specimen: BLOOD  Result Value Ref Range Status   Specimen Description BLOOD SITE NOT SPECIFIED  Final   Special Requests   Final    BOTTLES DRAWN AEROBIC AND ANAEROBIC Blood Culture results may not be optimal due to an inadequate volume of blood received in culture bottles   Culture   Final    NO GROWTH 4 DAYS Performed at [facility], [place] [address], [place], [place] [address]    Report Status PENDING  Incomplete  Urine culture     Status: Abnormal   Collection Time: 11/22/20  3:52 PM   Specimen: Urine, Random  Result Value Ref Range Status   Specimen Description URINE, RANDOM  Final   Special Requests   Final    NONE Performed at [facility], [name] [address] [place], [place] [address]    Culture >=100,000 COLONIES/mL ESCHERICHIA COLI (A)  Final   Report Status 11/25/2020 FINAL  Final   Organism ID, Bacteria ESCHERICHIA COLI (A)  Final      Susceptibility   Escherichia coli - MIC*    AMPICILLIN <=2 SENSITIVE Sensitive     CEFAZOLIN <=4 SENSITIVE Sensitive     CEFEPIME  <=0.12 SENSITIVE Sensitive     CEFTRIAXONE <=0.25 SENSITIVE Sensitive     CIPROFLOXACIN <=0.25 SENSITIVE Sensitive     GENTAMICIN <=1 SENSITIVE Sensitive     IMIPENEM <=0.25 SENSITIVE Sensitive     NITROFURANTOIN <=16 SENSITIVE Sensitive  TRIMETH/SULFA <=20 SENSITIVE Sensitive     AMPICILLIN/SULBACTAM <=2 SENSITIVE Sensitive     PIP/TAZO <=4 SENSITIVE Sensitive     * >=100,000 COLONIES/mL ESCHERICHIA COLI  Resp Panel by RT-PCR (Flu A&B, Covid) Nasopharyngeal Swab     Status: None   Collection Time: 11/22/20  4:58 PM   Specimen: Nasopharyngeal Swab; Nasopharyngeal(NP) swabs in vial transport medium  Result Value Ref Range Status   SARS Coronavirus 2 by RT PCR NEGATIVE NEGATIVE Final    Comment: (NOTE) SARS-CoV-2 target nucleic acids are NOT DETECTED.  The SARS-CoV-2 RNA is generally detectable in upper respiratory specimens during the acute phase of infection. The lowest concentration of SARS-CoV-2 viral copies this assay can detect is 138 copies/mL. A negative result does not preclude SARS-Cov-2 infection and should not be used as the sole basis for treatment or other patient management decisions. A negative result may occur with  improper specimen collection/handling, submission of specimen other than nasopharyngeal swab, presence of viral mutation(s) within the areas targeted by this assay, and inadequate number of viral copies(<138 copies/mL). A negative result must be combined with clinical observations, patient history, and epidemiological information. The expected result is Negative.  Fact Sheet for Patients:  EntrepreneurPulse.com.au  Fact Sheet for Healthcare Providers:  IncredibleEmployment.be  This test is no t yet approved or cleared by the [place] FDA and  has been authorized for detection and/or diagnosis of SARS-CoV-2 by FDA under an Emergency Use Authorization (EUA). This EUA will remain  in effect (meaning this test  can be used) for the duration of the COVID-19 declaration under Section 564(b)(1) of the Act, 21 U.S.C.section 360bbb-3(b)(1), unless the authorization is terminated  or revoked sooner.       Influenza A by PCR NEGATIVE NEGATIVE Final   Influenza B by PCR NEGATIVE NEGATIVE Final    Comment: (NOTE) The Xpert Xpress SARS-CoV-2/FLU/RSV plus assay is intended as an aid in the diagnosis of influenza from Nasopharyngeal swab specimens and should not be used as a sole basis for treatment. Nasal washings and aspirates are unacceptable for Xpert Xpress SARS-CoV-2/FLU/RSV testing.  Fact Sheet for Patients: EntrepreneurPulse.com.au  Fact Sheet for Healthcare Providers: IncredibleEmployment.be  This test is not yet approved or cleared by the [place] FDA and has been authorized for detection and/or diagnosis of SARS-CoV-2 by FDA under an Emergency Use Authorization (EUA). This EUA will remain in effect (meaning this test can be used) for the duration of the COVID-19 declaration under Section 564(b)(1) of the Act, 21 U.S.C. section 360bbb-3(b)(1), unless the authorization is terminated or revoked.  Performed at [facility], [place] [address] [place], [place] [address]   SARS CORONAVIRUS 2 (TAT 6-24 HRS) Nasopharyngeal Nasopharyngeal Swab     Status: None   Collection Time: 11/26/20  9:31 AM   Specimen: Nasopharyngeal Swab  Result Value Ref Range Status   SARS Coronavirus 2 NEGATIVE NEGATIVE Final    Comment: (NOTE) SARS-CoV-2 target nucleic acids are NOT DETECTED.  The SARS-CoV-2 RNA is generally detectable in upper and lower respiratory specimens during the acute phase of infection. Negative results do not preclude SARS-CoV-2 infection, do not rule out co-infections with other pathogens, and should not be used as the sole basis for treatment or other patient management decisions. Negative results must be combined with clinical  observations, patient history, and epidemiological information. The expected result is Negative.  Fact Sheet for Patients: SugarRoll.be  Fact Sheet for Healthcare Providers: https://www.woods-mathews.com/  This test is not yet approved or  cleared by the [place] and  has been authorized for detection and/or diagnosis of SARS-CoV-2 by FDA under an Emergency Use Authorization (EUA). This EUA will remain  in effect (meaning this test can be used) for the duration of the COVID-19 declaration under Se ction 564(b)(1) of the Act, 21 U.S.C. section 360bbb-3(b)(1), unless the authorization is terminated or revoked sooner.  Performed at [facility], [place] [address] [place], [place] [address]      SIGNED:   Charlynne Cousins, MD  [facility] 11/27/2020, 8:58 AM Pager   If 7PM-7AM, please contact night-coverage [email or web address] Password [email or web address]

## 2020-11-27 NOTE — Progress Notes (Signed)
DNR paper signed. Patient discharged to [facility] via [facility] with all belongings.

## 2020-11-27 NOTE — Progress Notes (Signed)
Report called to [facility] nurse at [phone]. Discharge teaching given. Messages MD to sign pt DNR form in drawer outside pts room

## 2020-11-29 DIAGNOSIS — I2510 Atherosclerotic heart disease of native coronary artery without angina pectoris: Secondary | ICD-10-CM | POA: Diagnosis not present

## 2020-11-29 DIAGNOSIS — I10 Essential (primary) hypertension: Secondary | ICD-10-CM | POA: Diagnosis not present

## 2020-11-29 DIAGNOSIS — E785 Hyperlipidemia, unspecified: Secondary | ICD-10-CM | POA: Diagnosis not present

## 2020-11-29 DIAGNOSIS — I5042 Chronic combined systolic (congestive) and diastolic (congestive) heart failure: Secondary | ICD-10-CM | POA: Diagnosis not present

## 2020-11-29 DIAGNOSIS — E440 Moderate protein-calorie malnutrition: Secondary | ICD-10-CM | POA: Diagnosis not present

## 2020-11-29 DIAGNOSIS — G629 Polyneuropathy, unspecified: Secondary | ICD-10-CM | POA: Diagnosis not present

## 2020-11-29 DIAGNOSIS — K219 Gastro-esophageal reflux disease without esophagitis: Secondary | ICD-10-CM | POA: Diagnosis not present

## 2020-11-29 DIAGNOSIS — J449 Chronic obstructive pulmonary disease, unspecified: Secondary | ICD-10-CM | POA: Diagnosis not present

## 2020-11-29 DIAGNOSIS — D6489 Other specified anemias: Secondary | ICD-10-CM | POA: Diagnosis not present

## 2020-11-29 DIAGNOSIS — G894 Chronic pain syndrome: Secondary | ICD-10-CM | POA: Diagnosis not present

## 2020-11-29 DIAGNOSIS — C3490 Malignant neoplasm of unspecified part of unspecified bronchus or lung: Secondary | ICD-10-CM | POA: Diagnosis not present

## 2020-11-30 NOTE — Patient Instructions (Signed)
Goals Addressed      Optimal Cognitive Function   On track    Timeframe:  Long-Range Goal Priority:  Medium Start Date: 07/26/20                             Expected End Date: 07/26/21                     Next Follow Up date: 12/17/20  Patient Goals/Self-Care Activities:   - Maintain Optimal Cognitive Function  - Follow up with PCP for evatuation of change in cognitive function and behavior

## 2020-11-30 NOTE — Chronic Care Management (AMB) (Signed)
Chronic Care Management   CCM RN Visit Note  11/23/2020 Name: Sara Fox MRN: [id number] DOB: 1941-04-11  Subjective: Sara Fox is a 80 y.o. year old female who is a primary care patient of [name], [place]. The care management team was consulted for assistance with disease management and care coordination needs.    Engaged with patient by telephone for follow up visit in response to provider referral for case management and/or care coordination services.   Consent to Services:  The patient was given information about Chronic Care Management services, agreed to services, and gave verbal consent prior to initiation of services.  Please see initial visit note for detailed documentation.   Patient agreed to services and verbal consent obtained.   Assessment: Review of patient past medical history, allergies, medications, health status, including review of consultants reports, laboratory and other test data, was performed as part of comprehensive evaluation and provision of chronic care management services.   SDOH (Social Determinants of Health) assessments and interventions performed:   CCM Care Plan  Allergies  Allergen Reactions   Buprenorphine Hcl Shortness Of Breath    Throat swelling/trouble breathing and lethargic   Morphine And Related Shortness Of Breath    Throat swelling/trouble breathing and lethargic   Celebrex [Celecoxib] Diarrhea and Swelling    Swelling of legs    Vioxx [Rofecoxib] Palpitations   Codeine Other (See Comments)    Bloated     Outpatient Encounter Medications as of 11/23/2020  Medication Sig   aspirin EC 81 MG tablet Take 81 mg by mouth daily as needed (headache).   Calcium Citrate-Vitamin D (CALCIUM CITRATE+D3 PETITES PO) Take 1 tablet by mouth daily.   cholecalciferol (VITAMIN D) 25 MCG (1000 UNIT) tablet Take 1,000 Units by mouth daily.   ciclopirox (PENLAC) 8 % solution Apply 1 application topically at bedtime. Apply over nail and  surrounding skin. Apply daily over previous coat. After seven (7) days, file nail and continue cycle.   [employer] MAGNESIUM OXIDE 250 MG TABS TAKE 1 TABLET BY MOUTH WITH EVENING MEAL DAILY   donepezil (ARICEPT) 10 MG tablet Take 1 tablet (10 mg total) by mouth at bedtime.   ferrous sulfate 325 (65 FE) MG tablet Take 325 mg by mouth daily with breakfast.   furosemide (LASIX) 20 MG tablet TAKE 1 TABLET BY MOUTH EVERY OTHER DAY   gabapentin (NEURONTIN) 100 MG capsule Take 1 capsule (100 mg total) by mouth 2 (two) times daily.   ibuprofen (ADVIL) 200 MG tablet Take 400 mg by mouth every 6 (six) hours as needed (pain).   metoprolol tartrate (LOPRESSOR) 25 MG tablet TAKE 1 TABLET BY MOUTH TWICE A DAY   Multiple Vitamin (MULTIVITAMIN WITH MINERALS) TABS tablet Take 1 tablet by mouth daily.   nitroGLYCERIN (NITROSTAT) 0.4 MG SL tablet PLACE ONE TABLET UNDER THE TONGUE EVERY 5 MINUTES AS NEEDED FOR CHEST PAIN.   oxybutynin (DITROPAN) 5 MG tablet Take 0.5 tablets (2.5 mg total) by mouth 2 (two) times daily.   pantoprazole (PROTONIX) 40 MG tablet Take 1 tablet (40 mg total) by mouth 2 (two) times daily.   polyethylene glycol (MIRALAX / GLYCOLAX) packet Take 17 g by mouth daily as needed for mild constipation.   Potassium Chloride ER 20 MEQ TBCR TAKE 2 TABLETS BY MOUTH EVERY MORNING   QUEtiapine (SEROQUEL) 50 MG tablet TAKE 1 TABLET BY MOUTH EVERYDAY AT BEDTIME   rosuvastatin (CRESTOR) 40 MG tablet TAKE 1 TABLET BY MOUTH ONCE DAILY   SYMBICORT  160-4.5 MCG/ACT inhaler TAKE 2 PUFFS BY MOUTH TWICE A DAY   traMADol (ULTRAM) 50 MG tablet TAKE 1 TABLET 3 TIMES A DAY AND CAN TAKE 1 EXTRA 20 DAYS/MONTH   [DISCONTINUED] cephALEXin (KEFLEX) 500 MG capsule Take 1 capsule (500 mg total) by mouth 4 (four) times daily for 4 days.   [DISCONTINUED] memantine (NAMENDA) 5 MG tablet Take 1 tablet (5 mg total) by mouth daily.   [EXPIRED] cephALEXin (KEFLEX) capsule 500 mg    [EXPIRED] sodium chloride 0.9 % bolus 250 mL    [EXPIRED]  sodium chloride 0.9 % bolus 250 mL    [EXPIRED] sodium chloride 0.9 % bolus 250 mL    [DISCONTINUED] 0.9 %  sodium chloride infusion    [DISCONTINUED] acetaminophen (TYLENOL) suppository 650 mg    [DISCONTINUED] acetaminophen (TYLENOL) tablet 650 mg    [DISCONTINUED] aspirin EC tablet 81 mg    [DISCONTINUED] cefTRIAXone (ROCEPHIN) 1 g in sodium chloride 0.9 % 100 mL IVPB    [DISCONTINUED] cephALEXin (KEFLEX) capsule 500 mg    [DISCONTINUED] cholecalciferol (VITAMIN D3) tablet 1,000 Units    [DISCONTINUED] donepezil (ARICEPT) tablet 10 mg    [DISCONTINUED] enoxaparin (LOVENOX) injection 40 mg    [DISCONTINUED] ferrous sulfate tablet 325 mg    [DISCONTINUED] furosemide (LASIX) tablet 20 mg    [DISCONTINUED] gabapentin (NEURONTIN) capsule 100 mg    [DISCONTINUED] magnesium oxide (MAG-OX) tablet 200 mg    [DISCONTINUED] memantine (NAMENDA) tablet 5 mg    [DISCONTINUED] metoprolol tartrate (LOPRESSOR) tablet 25 mg    [DISCONTINUED] mometasone-formoterol (DULERA) 200-5 MCG/ACT inhaler 2 puff    [DISCONTINUED] multivitamin with minerals tablet 1 tablet    [DISCONTINUED] naproxen (NAPROSYN) tablet 375 mg    [DISCONTINUED] nitroGLYCERIN (NITROSTAT) SL tablet 0.4 mg    [DISCONTINUED] oxybutynin (DITROPAN) tablet 2.5 mg    [DISCONTINUED] pantoprazole (PROTONIX) EC tablet 40 mg    [DISCONTINUED] potassium chloride (KLOR-CON) CR tablet 40 mEq    [DISCONTINUED] QUEtiapine (SEROQUEL) tablet 50 mg    [DISCONTINUED] rosuvastatin (CRESTOR) tablet 40 mg    [DISCONTINUED] sodium chloride flush (NS) 0.9 % injection 3 mL    [DISCONTINUED] sodium chloride flush (NS) 0.9 % injection 3 mL    [DISCONTINUED] traMADol (ULTRAM) tablet 50 mg    [DISCONTINUED] Zinc Oxide (TRIPLE PASTE) 12.8 % ointment    No facility-administered encounter medications on file as of 11/23/2020.    Patient Active Problem List   Diagnosis Date Noted   Fall (on)(from) incline, initial encounter 11/23/2020   Macrocytosis without  anemia 11/22/2020   Unwitnessed fall 11/22/2020   Late onset Alzheimer's dementia with behavioral disturbance ([place]) 11/18/2020   Visual hallucination 11/18/2020   Sundowning 11/18/2020   Non-small cell carcinoma of right lung, stage 1 ([place]) 06/08/2020   Pulmonary nodule 1 cm or greater in diameter 05/21/2020   Atherosclerotic heart disease of native coronary artery with other forms of angina pectoris ([place]) 04/22/2020   Memory loss 02/18/2019   Vitamin B12 deficiency 10/23/2018   Stage 3 chronic kidney disease ([place]) 06/03/2018   Chronic anemia 06/03/2018   Hypercholesterolemia 06/03/2018   Malnutrition of moderate degree 07/12/2017   Hypoalbuminemia 07/11/2017   Elevated LFTs 07/11/2017   Snoring 12/21/2016   Peripheral musculoskeletal gait disorder 02/05/2015   Lumbar post-laminectomy syndrome 02/05/2015   CAD in native artery 10/16/2014   NSTEMI (non-ST elevated myocardial infarction) ([place]) 09/13/2014   Cardiomyopathy, ischemic 59/93/5701   Chronic systolic heart failure ([place])    Tobacco abuse 09/12/2014   Dyslipidemia 09/12/2014  Normocytic anemia 09/12/2014   Chronic diastolic heart failure, NYHA class 1 ([place]) 09/12/2014   Postlaminectomy syndrome, cervical region 08/01/2013   Chronic midline low back pain with left-sided sciatica 08/01/2013   Shoulder joint contracture 08/01/2013   HTN (hypertension) 03/03/2013   Abnormal genetic test 03/03/2013    Conditions to be addressed/monitored: Stage 3a chronic kidney disease, HTN, chronic systolic heart failure, Mild Cognitive Impairment, Atherosclerotic heart disease of native coronary artery with other forms of angina pectoris, Lung Nodule    Care Plan : Dementia (Adult)  Updates made by Lynne Logan, RN since 11/23/2020 12:00 AM     Problem: Cognitive Function   Priority: High     Long-Range Goal: Optimal Cognitive Function   Start Date: 07/26/2020  Expected End Date: 07/26/2021  Recent Progress: On track  Priority:  High  Note:   Current Barriers:  Ineffective Self Health Maintenance Unable to self administer medications as prescribed Unable to perform ADLs independently Unable to perform IADLs independently Cognitive deficit  Clinical Goal(s):  Collaboration with [name], FNP regarding development and update of comprehensive plan of care as evidenced by provider attestation and co-signature Inter-disciplinary care team collaboration (see longitudinal plan of care) patient will work with care management team to address care coordination and chronic disease management needs related to Disease Management Educational Needs Care Coordination Medication Management and Education Psychosocial Support Dementia and Caregiver Support   Interventions:  11/23/20 completed inbound call with son [name] Evaluation of current treatment plan related to Dementia and patient's adherence to plan as established by provider. Collaboration with [name], FNP regarding development and update of comprehensive plan of care as evidenced by provider attestation       and co-signature Inter-disciplinary care team collaboration (see longitudinal plan of care) Determined patient was [facility]20 after having a fall in her home Determined patient was not injured and all imaging is negative for fracture; discussed she was diagnosed with having a UTI and is being treated appropriately Discussed patient will be evaluated today for PT and will most likely be transferred to a SNF once stable and ready for discharge Sent in basket message to embedded BSW [name] and PCP [name] FNP with a patient status update Discussed plans with patient for ongoing care management follow up and provided patient with direct contact information for care management team Self Care Activities:  Continue to keep all MD follow up appointments  Continue to take all medications exactly as prescribed   Continue to call the pharmacy for refills at least 7 days before taking last dose Notify the CCM team and or PCP if you are unable to afford your medication refills Call the CCM team and or PCP for questions or concerns  Patient Goals: - Maintain Optimal Cognitive Function   Follow Up Plan: Telephone follow up appointment with care management team member scheduled for: 12/17/20    Plan:Telephone follow up appointment with care management team member scheduled for:  12/17/20 Barb Merino, RN, BSN, CCM Care Management Coordinator [place] [facility]  Direct Phone: [phone]

## 2020-12-02 DIAGNOSIS — I10 Essential (primary) hypertension: Secondary | ICD-10-CM | POA: Diagnosis not present

## 2020-12-02 DIAGNOSIS — I5042 Chronic combined systolic (congestive) and diastolic (congestive) heart failure: Secondary | ICD-10-CM | POA: Diagnosis not present

## 2020-12-02 DIAGNOSIS — J449 Chronic obstructive pulmonary disease, unspecified: Secondary | ICD-10-CM | POA: Diagnosis not present

## 2020-12-02 DIAGNOSIS — R531 Weakness: Secondary | ICD-10-CM | POA: Diagnosis not present

## 2020-12-02 DIAGNOSIS — G894 Chronic pain syndrome: Secondary | ICD-10-CM | POA: Diagnosis not present

## 2020-12-03 DIAGNOSIS — N183 Chronic kidney disease, stage 3 unspecified: Secondary | ICD-10-CM | POA: Diagnosis not present

## 2020-12-03 DIAGNOSIS — I509 Heart failure, unspecified: Secondary | ICD-10-CM | POA: Diagnosis not present

## 2020-12-03 DIAGNOSIS — J449 Chronic obstructive pulmonary disease, unspecified: Secondary | ICD-10-CM | POA: Diagnosis not present

## 2020-12-06 ENCOUNTER — Ambulatory Visit: Payer: Self-pay

## 2020-12-06 ENCOUNTER — Telehealth: Payer: Medicare Other

## 2020-12-06 ENCOUNTER — Other Ambulatory Visit: Payer: Self-pay | Admitting: Internal Medicine

## 2020-12-06 DIAGNOSIS — G629 Polyneuropathy, unspecified: Secondary | ICD-10-CM | POA: Diagnosis not present

## 2020-12-06 DIAGNOSIS — J449 Chronic obstructive pulmonary disease, unspecified: Secondary | ICD-10-CM | POA: Diagnosis not present

## 2020-12-06 DIAGNOSIS — I5022 Chronic systolic (congestive) heart failure: Secondary | ICD-10-CM

## 2020-12-06 DIAGNOSIS — F02818 Dementia in other diseases classified elsewhere, unspecified severity, with other behavioral disturbance: Secondary | ICD-10-CM

## 2020-12-06 DIAGNOSIS — I10 Essential (primary) hypertension: Secondary | ICD-10-CM

## 2020-12-06 DIAGNOSIS — G3184 Mild cognitive impairment, so stated: Secondary | ICD-10-CM

## 2020-12-06 DIAGNOSIS — N1831 Chronic kidney disease, stage 3a: Secondary | ICD-10-CM

## 2020-12-06 DIAGNOSIS — R911 Solitary pulmonary nodule: Secondary | ICD-10-CM

## 2020-12-06 DIAGNOSIS — G894 Chronic pain syndrome: Secondary | ICD-10-CM | POA: Diagnosis not present

## 2020-12-06 DIAGNOSIS — R531 Weakness: Secondary | ICD-10-CM | POA: Diagnosis not present

## 2020-12-06 DIAGNOSIS — IMO0001 Reserved for inherently not codable concepts without codable children: Secondary | ICD-10-CM

## 2020-12-06 DIAGNOSIS — I5042 Chronic combined systolic (congestive) and diastolic (congestive) heart failure: Secondary | ICD-10-CM | POA: Diagnosis not present

## 2020-12-06 DIAGNOSIS — I25118 Atherosclerotic heart disease of native coronary artery with other forms of angina pectoris: Secondary | ICD-10-CM

## 2020-12-06 DIAGNOSIS — F0281 Dementia in other diseases classified elsewhere with behavioral disturbance: Secondary | ICD-10-CM

## 2020-12-06 DIAGNOSIS — G301 Alzheimer's disease with late onset: Secondary | ICD-10-CM

## 2020-12-06 NOTE — Chronic Care Management (AMB) (Signed)
Chronic Care Management    Social Work Note  12/06/2020 Name: Sara Fox MRN: [id number] DOB: [birth year]  Sara Fox is a 80 y.o. year old female who is a primary care patient of [name], [place]. The CCM team was consulted to assist the patient with chronic disease management and/or care coordination needs related to: Level of Care Concerns.   Engaged with patient son by phone  for follow up visit in response to provider referral for social work chronic care management and care coordination services.   Consent to Services:  The patient was given information about Chronic Care Management services, agreed to services, and gave verbal consent prior to initiation of services.  Please see initial visit note for detailed documentation.   Patient agreed to services and consent obtained.   Assessment: Review of patient past medical history, allergies, medications, and health status, including review of relevant consultants reports was performed today as part of a comprehensive evaluation and provision of chronic care management and care coordination services.     SDOH (Social Determinants of Health) assessments and interventions performed:    Advanced Directives Status: Not addressed in this encounter.  CCM Care Plan  Allergies  Allergen Reactions   Buprenorphine Hcl Shortness Of Breath    Throat swelling/trouble breathing and lethargic   Morphine And Related Shortness Of Breath    Throat swelling/trouble breathing and lethargic   Celebrex [Celecoxib] Diarrhea and Swelling    Swelling of legs    Vioxx [Rofecoxib] Palpitations   Codeine Other (See Comments)    Bloated     Outpatient Encounter Medications as of 12/06/2020  Medication Sig   aspirin EC 81 MG tablet Take 81 mg by mouth daily as needed (headache).   Calcium Citrate-Vitamin D (CALCIUM CITRATE+D3 PETITES PO) Take 1 tablet by mouth daily.   cholecalciferol (VITAMIN D) 25 MCG (1000 UNIT) tablet Take 1,000 Units by  mouth daily.   ciclopirox (PENLAC) 8 % solution Apply 1 application topically at bedtime. Apply over nail and surrounding skin. Apply daily over previous coat. After seven (7) days, file nail and continue cycle.   [employer]S MAGNESIUM OXIDE 250 MG TABS TAKE 1 TABLET BY MOUTH WITH EVENING MEAL DAILY   donepezil (ARICEPT) 10 MG tablet Take 1 tablet (10 mg total) by mouth at bedtime.   ferrous sulfate 325 (65 FE) MG tablet Take 325 mg by mouth daily with breakfast.   furosemide (LASIX) 20 MG tablet TAKE 1 TABLET BY MOUTH EVERY OTHER DAY   gabapentin (NEURONTIN) 100 MG capsule Take 1 capsule (100 mg total) by mouth 2 (two) times daily.   ibuprofen (ADVIL) 200 MG tablet Take 400 mg by mouth every 6 (six) hours as needed (pain).   memantine (NAMENDA) 5 MG tablet TAKE 1 TABLET (5 MG TOTAL) BY MOUTH DAILY.   metoprolol tartrate (LOPRESSOR) 25 MG tablet TAKE 1 TABLET BY MOUTH TWICE A DAY   Multiple Vitamin (MULTIVITAMIN WITH MINERALS) TABS tablet Take 1 tablet by mouth daily.   nitroGLYCERIN (NITROSTAT) 0.4 MG SL tablet PLACE ONE TABLET UNDER THE TONGUE EVERY 5 MINUTES AS NEEDED FOR CHEST PAIN.   oxybutynin (DITROPAN) 5 MG tablet Take 0.5 tablets (2.5 mg total) by mouth 2 (two) times daily.   pantoprazole (PROTONIX) 40 MG tablet Take 1 tablet (40 mg total) by mouth 2 (two) times daily.   polyethylene glycol (MIRALAX / GLYCOLAX) packet Take 17 g by mouth daily as needed for mild constipation.   Potassium Chloride ER 20 MEQ  TBCR TAKE 2 TABLETS BY MOUTH EVERY MORNING   QUEtiapine (SEROQUEL) 50 MG tablet TAKE 1 TABLET BY MOUTH EVERYDAY AT BEDTIME   rosuvastatin (CRESTOR) 40 MG tablet TAKE 1 TABLET BY MOUTH ONCE DAILY   SYMBICORT 160-4.5 MCG/ACT inhaler TAKE 2 PUFFS BY MOUTH TWICE A DAY   traMADol (ULTRAM) 50 MG tablet TAKE 1 TABLET 3 TIMES A DAY AND CAN TAKE 1 EXTRA 20 DAYS/MONTH   No facility-administered encounter medications on file as of 12/06/2020.    Patient Active Problem List   Diagnosis Date Noted    Fall (on)(from) incline, initial encounter 11/23/2020   Macrocytosis without anemia 11/22/2020   Unwitnessed fall 11/22/2020   Late onset Alzheimer's dementia with behavioral disturbance ([place]) 11/18/2020   Visual hallucination 11/18/2020   Sundowning 11/18/2020   Non-small cell carcinoma of right lung, stage 1 ([place]) 06/08/2020   Pulmonary nodule 1 cm or greater in diameter 05/21/2020   Atherosclerotic heart disease of native coronary artery with other forms of angina pectoris ([place]) 04/22/2020   Memory loss 02/18/2019   Vitamin B12 deficiency 10/23/2018   Stage 3 chronic kidney disease ([place]) 06/03/2018   Chronic anemia 06/03/2018   Hypercholesterolemia 06/03/2018   Malnutrition of moderate degree 07/12/2017   Hypoalbuminemia 07/11/2017   Elevated LFTs 07/11/2017   Snoring 12/21/2016   Peripheral musculoskeletal gait disorder 02/05/2015   Lumbar post-laminectomy syndrome 02/05/2015   CAD in native artery 10/16/2014   NSTEMI (non-ST elevated myocardial infarction) ([place]) 09/13/2014   Cardiomyopathy, ischemic 69/79/4801   Chronic systolic heart failure ([place])    Tobacco abuse 09/12/2014   Dyslipidemia 09/12/2014   Normocytic anemia 09/12/2014   Chronic diastolic heart failure, NYHA class 1 ([place]) 09/12/2014   Postlaminectomy syndrome, cervical region 08/01/2013   Chronic midline low back pain with left-sided sciatica 08/01/2013   Shoulder joint contracture 08/01/2013   HTN (hypertension) 03/03/2013   Abnormal genetic test 03/03/2013    Conditions to be addressed/monitored: CHF, HTN, CKD Stage III, and Dementia; Level of care concerns  Care Plan : Social Work [facility] Care Plan  Updates made by [name] since 12/06/2020 12:00 AM     Problem: Disease Progression      Goal: Disease Progression Managed   Start Date: 11/12/2020  Expected End Date: 01/11/2021  Priority: High  Note:   Current Barriers:  Chronic disease management support and education needs related to CHF, HTN, CKD  Stage III, and Mild Cognitive Impairment   Increased Agitation  Social Worker Clinical Goal(s):  patient will work with SW to identify and address any acute and/or chronic care coordination needs related to the self health management of CHF, HTN, CKD Stage III, and Mild Cognitive Impairment   Patient and her son will work with SW to identify long term care options Patient, with the help of her son, will visit the ED to evaluate cause of increased agitation and combativeness Goal not met  SW Interventions:  Inter-disciplinary care team collaboration (see longitudinal plan of care) Collaboration with [name], FNP regarding development and update of comprehensive plan of care as evidenced by provider attestation and co-signature Collaboration with [place] who indicates patients son has contacted her regarding concerns with LTC Medicaid Successful outbound call placed to [name] who indicates the patient has 8 insurance policies which he was told would have to be canceled in order to qualify for LTC Medicaid Discussed Mr. Blane Ohara concern with paying for patients medical needs as well as funeral expenses Advised [name]  that the patients health insurance would not be canceled as she would qualify for Medicare/Medicaid Discussed the patients life insurance could be cashed into a funeral home prior to [facility] approval in order to pre-pay for services Determined [name] does have policy information on hand and will plan to visit a local funeral home on 7.26 to discuss options for pre-paying for a funeral service Advised [name] if he is met with barriers to contact SW Scheduled follow up call over the next week  Patient Goals/Self-Care Activities patient will: with the help of her son  -  Participate in rehab -Work with SW for long term care placement -Art therapist funeral home regarding pre-payment of services  Follow Up Plan:  SW will follow up with patients  son on 7.29.22       Follow Up Plan: SW will follow up with patient by phone over the next week      [name], BSW, CDP Social Worker, Certified Dementia Practitioner [place] [facility] [place] [facility] [phone]

## 2020-12-06 NOTE — Patient Instructions (Signed)
Social Worker Visit Information  Goals we discussed today:   Goals Addressed             This Visit's Progress    Disease Progression Managed       Timeframe:  Short-Term Goal Priority:  High Start Date:  7.1.22                           Expected End Date: 8.30.22                       Patient Goals/Self-Care Activities patient will: with the help of her son  - Participate in rehab -Work with SW for long term care placement -Art therapist funeral home regarding pre-payment of services         Materials Provided: Verbal education about Medicaid provided by phone  Follow Up Plan: SW will follow up with patient by phone over the next week   [name], BSW, CDP Social Worker, Certified Dementia Practitioner [place] [facility] [place] [facility] [phone]

## 2020-12-09 ENCOUNTER — Ambulatory Visit: Payer: Medicare Other | Admitting: Sports Medicine

## 2020-12-10 ENCOUNTER — Telehealth: Payer: Self-pay

## 2020-12-10 ENCOUNTER — Ambulatory Visit: Payer: Medicare Other

## 2020-12-10 DIAGNOSIS — I10 Essential (primary) hypertension: Secondary | ICD-10-CM

## 2020-12-10 DIAGNOSIS — N1831 Chronic kidney disease, stage 3a: Secondary | ICD-10-CM

## 2020-12-10 DIAGNOSIS — F02818 Dementia in other diseases classified elsewhere, unspecified severity, with other behavioral disturbance: Secondary | ICD-10-CM

## 2020-12-10 DIAGNOSIS — I5022 Chronic systolic (congestive) heart failure: Secondary | ICD-10-CM

## 2020-12-10 DIAGNOSIS — G301 Alzheimer's disease with late onset: Secondary | ICD-10-CM | POA: Diagnosis not present

## 2020-12-10 DIAGNOSIS — E440 Moderate protein-calorie malnutrition: Secondary | ICD-10-CM | POA: Diagnosis not present

## 2020-12-10 DIAGNOSIS — G629 Polyneuropathy, unspecified: Secondary | ICD-10-CM | POA: Diagnosis not present

## 2020-12-10 DIAGNOSIS — J449 Chronic obstructive pulmonary disease, unspecified: Secondary | ICD-10-CM | POA: Diagnosis not present

## 2020-12-10 DIAGNOSIS — G894 Chronic pain syndrome: Secondary | ICD-10-CM | POA: Diagnosis not present

## 2020-12-10 DIAGNOSIS — I5042 Chronic combined systolic (congestive) and diastolic (congestive) heart failure: Secondary | ICD-10-CM | POA: Diagnosis not present

## 2020-12-10 DIAGNOSIS — I25118 Atherosclerotic heart disease of native coronary artery with other forms of angina pectoris: Secondary | ICD-10-CM | POA: Diagnosis not present

## 2020-12-10 NOTE — Telephone Encounter (Signed)
  Care Management   Follow Up Note   12/10/2020 Name: Sara Fox MRN: [id number] DOB: 07-Mar-1941   Referred by: [name], FNP Reason for referral : Chronic Care Management (Unsuccessful call)   An unsuccessful telephone outreach was attempted today. The patient was referred to the case management team for assistance with care management and care coordination. SW left a HIPAA compliant voice message requesting a return call.  Follow Up Plan: The care management team will reach out to the patient again over the next 14 days.   [name], BSW, CDP Social Worker, Certified Dementia Practitioner [place] [facility] [place] [facility] [phone]

## 2020-12-10 NOTE — Chronic Care Management (AMB) (Signed)
Chronic Care Management    Social Work Note  12/10/2020 Name: Sara Fox MRN: [id number] DOB: August 30, 1940  Sara Fox is a 80 y.o. year old female who is a primary care patient of [name], [place]. The CCM team was consulted to assist the patient with chronic disease management and/or care coordination needs related to: Level of Care Concerns.   Engaged with patients son [name] by phone  for follow up visit in response to provider referral for social work chronic care management and care coordination services.   Consent to Services:  The patient was given information about Chronic Care Management services, agreed to services, and gave verbal consent prior to initiation of services.  Please see initial visit note for detailed documentation.   Patient agreed to services and consent obtained.   Assessment: Review of patient past medical history, allergies, medications, and health status, including review of relevant consultants reports was performed today as part of a comprehensive evaluation and provision of chronic care management and care coordination services.     SDOH (Social Determinants of Health) assessments and interventions performed:    Advanced Directives Status: Not addressed in this encounter.  CCM Care Plan  Allergies  Allergen Reactions   Buprenorphine Hcl Shortness Of Breath    Throat swelling/trouble breathing and lethargic   Morphine And Related Shortness Of Breath    Throat swelling/trouble breathing and lethargic   Celebrex [Celecoxib] Diarrhea and Swelling    Swelling of legs    Vioxx [Rofecoxib] Palpitations   Codeine Other (See Comments)    Bloated     Outpatient Encounter Medications as of 12/10/2020  Medication Sig   aspirin EC 81 MG tablet Take 81 mg by mouth daily as needed (headache).   Calcium Citrate-Vitamin D (CALCIUM CITRATE+D3 PETITES PO) Take 1 tablet by mouth daily.   cholecalciferol (VITAMIN D) 25 MCG (1000 UNIT) tablet Take  1,000 Units by mouth daily.   ciclopirox (PENLAC) 8 % solution Apply 1 application topically at bedtime. Apply over nail and surrounding skin. Apply daily over previous coat. After seven (7) days, file nail and continue cycle.   [employer] MAGNESIUM OXIDE 250 MG TABS TAKE 1 TABLET BY MOUTH WITH EVENING MEAL DAILY   donepezil (ARICEPT) 10 MG tablet Take 1 tablet (10 mg total) by mouth at bedtime.   ferrous sulfate 325 (65 FE) MG tablet Take 325 mg by mouth daily with breakfast.   furosemide (LASIX) 20 MG tablet TAKE 1 TABLET BY MOUTH EVERY OTHER DAY   gabapentin (NEURONTIN) 100 MG capsule Take 1 capsule (100 mg total) by mouth 2 (two) times daily.   ibuprofen (ADVIL) 200 MG tablet Take 400 mg by mouth every 6 (six) hours as needed (pain).   memantine (NAMENDA) 5 MG tablet TAKE 1 TABLET (5 MG TOTAL) BY MOUTH DAILY.   metoprolol tartrate (LOPRESSOR) 25 MG tablet TAKE 1 TABLET BY MOUTH TWICE A DAY   Multiple Vitamin (MULTIVITAMIN WITH MINERALS) TABS tablet Take 1 tablet by mouth daily.   nitroGLYCERIN (NITROSTAT) 0.4 MG SL tablet PLACE ONE TABLET UNDER THE TONGUE EVERY 5 MINUTES AS NEEDED FOR CHEST PAIN.   oxybutynin (DITROPAN) 5 MG tablet Take 0.5 tablets (2.5 mg total) by mouth 2 (two) times daily.   pantoprazole (PROTONIX) 40 MG tablet Take 1 tablet (40 mg total) by mouth 2 (two) times daily.   polyethylene glycol (MIRALAX / GLYCOLAX) packet Take 17 g by mouth daily as needed for mild constipation.   Potassium Chloride ER  20 MEQ TBCR TAKE 2 TABLETS BY MOUTH EVERY MORNING   QUEtiapine (SEROQUEL) 50 MG tablet TAKE 1 TABLET BY MOUTH EVERYDAY AT BEDTIME   rosuvastatin (CRESTOR) 40 MG tablet TAKE 1 TABLET BY MOUTH ONCE DAILY   SYMBICORT 160-4.5 MCG/ACT inhaler TAKE 2 PUFFS BY MOUTH TWICE A DAY   traMADol (ULTRAM) 50 MG tablet TAKE 1 TABLET 3 TIMES A DAY AND CAN TAKE 1 EXTRA 20 DAYS/MONTH   No facility-administered encounter medications on file as of 12/10/2020.    Patient Active Problem List   Diagnosis  Date Noted   Fall (on)(from) incline, initial encounter 11/23/2020   Macrocytosis without anemia 11/22/2020   Unwitnessed fall 11/22/2020   Late onset Alzheimer's dementia with behavioral disturbance ([place]) 11/18/2020   Visual hallucination 11/18/2020   Sundowning 11/18/2020   Non-small cell carcinoma of right lung, stage 1 ([place]) 06/08/2020   Pulmonary nodule 1 cm or greater in diameter 05/21/2020   Atherosclerotic heart disease of native coronary artery with other forms of angina pectoris ([place]) 04/22/2020   Memory loss 02/18/2019   Vitamin B12 deficiency 10/23/2018   Stage 3 chronic kidney disease ([place]) 06/03/2018   Chronic anemia 06/03/2018   Hypercholesterolemia 06/03/2018   Malnutrition of moderate degree 07/12/2017   Hypoalbuminemia 07/11/2017   Elevated LFTs 07/11/2017   Snoring 12/21/2016   Peripheral musculoskeletal gait disorder 02/05/2015   Lumbar post-laminectomy syndrome 02/05/2015   CAD in native artery 10/16/2014   NSTEMI (non-ST elevated myocardial infarction) ([place]) 09/13/2014   Cardiomyopathy, ischemic 87/56/4332   Chronic systolic heart failure ([place])    Tobacco abuse 09/12/2014   Dyslipidemia 09/12/2014   Normocytic anemia 09/12/2014   Chronic diastolic heart failure, NYHA class 1 ([place]) 09/12/2014   Postlaminectomy syndrome, cervical region 08/01/2013   Chronic midline low back pain with left-sided sciatica 08/01/2013   Shoulder joint contracture 08/01/2013   HTN (hypertension) 03/03/2013   Abnormal genetic test 03/03/2013    Conditions to be addressed/monitored: CHF, HTN, CKD Stage III, and Dementia; Level of care concerns  Care Plan : Social Work [facility] Care Plan  Updates made by [name] since 12/10/2020 12:00 AM     Problem: Disease Progression      Goal: Disease Progression Managed   Start Date: 11/12/2020  Expected End Date: 01/11/2021  Priority: High  Note:   Current Barriers:  Chronic disease management support and education needs related to  CHF, HTN, CKD Stage III, and Mild Cognitive Impairment   Increased Agitation  Social Worker Clinical Goal(s):  patient will work with SW to identify and address any acute and/or chronic care coordination needs related to the self health management of CHF, HTN, CKD Stage III, and Mild Cognitive Impairment   Patient and her son will work with SW to identify long term care options Patient, with the help of her son, will visit the ED to evaluate cause of increased agitation and combativeness Goal not met  SW Interventions:  Inter-disciplinary care team collaboration (see longitudinal plan of care) Collaboration with [name], FNP regarding development and update of comprehensive plan of care as evidenced by provider attestation and co-signature Inbound call received from [name] indicating the patients health plan has indicated they will no longer cover the cost of rehab Discussed [name] has tried to appeal this decision and appeal was denied Patient is to be discharged from rehab on Saturday 7.30 Assessed for progress with Medicaid application for placement into long term care  [name] indicates he is done trying to  place the patient as it is "too frustrating" Determined [name] plans to take the patient home and care for her there Advised Mr. Cleda Clarks would follow up with him next week to assess how patient is doing in the home Collaboration with [place] to advise of patents current disposition and plan  Patient Goals/Self-Care Activities patient will: with the help of her son  -  Participate in rehab -Work with SW for long term care placement -[employer] regarding pre-payment of services  Follow Up Plan:  SW will follow up with patients son over the next week       Follow Up Plan: SW will follow up with patient by phone over the next week      [name], BSW, CDP Social Worker, Certified Dementia Practitioner [place] / [place]  Management [phone]

## 2020-12-10 NOTE — Telephone Encounter (Signed)
  Care Management   Follow Up Note   12/10/2020 Name: Sara Fox MRN: [id number] DOB: [birth year]   Referred by: [name], FNP Reason for referral : Chronic Care Management (Unsuccessful call)   Collaboration with [place] who indicates she received a voice message from the patients son returning this SW previous unsuccessful call. SW placed a second unsuccessful outbound call to [name]. HIPAA compliant voice message left requesting a return call.  Follow Up Plan: SW will follow up over the next week as scheduled.  [name], BSW, CDP Social Worker, Certified Dementia Practitioner [place] / [place] Management [phone]

## 2020-12-13 ENCOUNTER — Ambulatory Visit (INDEPENDENT_AMBULATORY_CARE_PROVIDER_SITE_OTHER): Payer: Medicare Other

## 2020-12-13 DIAGNOSIS — F02818 Dementia in other diseases classified elsewhere, unspecified severity, with other behavioral disturbance: Secondary | ICD-10-CM

## 2020-12-13 DIAGNOSIS — F0281 Dementia in other diseases classified elsewhere with behavioral disturbance: Secondary | ICD-10-CM

## 2020-12-13 DIAGNOSIS — G301 Alzheimer's disease with late onset: Secondary | ICD-10-CM

## 2020-12-13 DIAGNOSIS — N1831 Chronic kidney disease, stage 3a: Secondary | ICD-10-CM

## 2020-12-13 DIAGNOSIS — I10 Essential (primary) hypertension: Secondary | ICD-10-CM

## 2020-12-13 NOTE — Chronic Care Management (AMB) (Addendum)
Chronic Care Management    Social Work Note  12/13/2020 Name: Sara Fox MRN: [id number] DOB: [birth year]  Sara Fox is a 80 y.o. year old female who is a primary care patient of [name], [place]. The CCM team was consulted to assist the patient with chronic disease management and/or care coordination needs related to: Level of Care Concerns.   Engaged with the patient and her son by phone  for follow up visit in response to provider referral for social work chronic care management and care coordination services.   Consent to Services:  The patient was given information about Chronic Care Management services, agreed to services, and gave verbal consent prior to initiation of services.  Please see initial visit note for detailed documentation.   Patient agreed to services and consent obtained.   Assessment: Review of patient past medical history, allergies, medications, and health status, including review of relevant consultants reports was performed today as part of a comprehensive evaluation and provision of chronic care management and care coordination services.     SDOH (Social Determinants of Health) assessments and interventions performed:    Advanced Directives Status: Not addressed in this encounter.  CCM Care Plan  Allergies  Allergen Reactions   Buprenorphine Hcl Shortness Of Breath    Throat swelling/trouble breathing and lethargic   Morphine And Related Shortness Of Breath    Throat swelling/trouble breathing and lethargic   Celebrex [Celecoxib] Diarrhea and Swelling    Swelling of legs    Vioxx [Rofecoxib] Palpitations   Codeine Other (See Comments)    Bloated     Outpatient Encounter Medications as of 12/13/2020  Medication Sig   aspirin EC 81 MG tablet Take 81 mg by mouth daily as needed (headache).   Calcium Citrate-Vitamin D (CALCIUM CITRATE+D3 PETITES PO) Take 1 tablet by mouth daily.   cholecalciferol (VITAMIN D) 25 MCG (1000 UNIT) tablet Take 1,000  Units by mouth daily.   ciclopirox (PENLAC) 8 % solution Apply 1 application topically at bedtime. Apply over nail and surrounding skin. Apply daily over previous coat. After seven (7) days, file nail and continue cycle.   [employer] MAGNESIUM OXIDE 250 MG TABS TAKE 1 TABLET BY MOUTH WITH EVENING MEAL DAILY   donepezil (ARICEPT) 10 MG tablet Take 1 tablet (10 mg total) by mouth at bedtime.   ferrous sulfate 325 (65 FE) MG tablet Take 325 mg by mouth daily with breakfast.   furosemide (LASIX) 20 MG tablet TAKE 1 TABLET BY MOUTH EVERY OTHER DAY   gabapentin (NEURONTIN) 100 MG capsule Take 1 capsule (100 mg total) by mouth 2 (two) times daily.   ibuprofen (ADVIL) 200 MG tablet Take 400 mg by mouth every 6 (six) hours as needed (pain).   memantine (NAMENDA) 5 MG tablet TAKE 1 TABLET (5 MG TOTAL) BY MOUTH DAILY.   metoprolol tartrate (LOPRESSOR) 25 MG tablet TAKE 1 TABLET BY MOUTH TWICE A DAY   Multiple Vitamin (MULTIVITAMIN WITH MINERALS) TABS tablet Take 1 tablet by mouth daily.   nitroGLYCERIN (NITROSTAT) 0.4 MG SL tablet PLACE ONE TABLET UNDER THE TONGUE EVERY 5 MINUTES AS NEEDED FOR CHEST PAIN.   oxybutynin (DITROPAN) 5 MG tablet Take 0.5 tablets (2.5 mg total) by mouth 2 (two) times daily.   pantoprazole (PROTONIX) 40 MG tablet Take 1 tablet (40 mg total) by mouth 2 (two) times daily.   polyethylene glycol (MIRALAX / GLYCOLAX) packet Take 17 g by mouth daily as needed for mild constipation.   Potassium Chloride  ER 20 MEQ TBCR TAKE 2 TABLETS BY MOUTH EVERY MORNING   QUEtiapine (SEROQUEL) 50 MG tablet TAKE 1 TABLET BY MOUTH EVERYDAY AT BEDTIME   rosuvastatin (CRESTOR) 40 MG tablet TAKE 1 TABLET BY MOUTH ONCE DAILY   SYMBICORT 160-4.5 MCG/ACT inhaler TAKE 2 PUFFS BY MOUTH TWICE A DAY   traMADol (ULTRAM) 50 MG tablet TAKE 1 TABLET 3 TIMES A DAY AND CAN TAKE 1 EXTRA 20 DAYS/MONTH   No facility-administered encounter medications on file as of 12/13/2020.    Patient Active Problem List   Diagnosis Date  Noted   Fall (on)(from) incline, initial encounter 11/23/2020   Macrocytosis without anemia 11/22/2020   Unwitnessed fall 11/22/2020   Late onset Alzheimer's dementia with behavioral disturbance ([place]) 11/18/2020   Visual hallucination 11/18/2020   Sundowning 11/18/2020   Non-small cell carcinoma of right lung, stage 1 ([place]) 06/08/2020   Pulmonary nodule 1 cm or greater in diameter 05/21/2020   Atherosclerotic heart disease of native coronary artery with other forms of angina pectoris ([place]) 04/22/2020   Memory loss 02/18/2019   Vitamin B12 deficiency 10/23/2018   Stage 3 chronic kidney disease ([place]) 06/03/2018   Chronic anemia 06/03/2018   Hypercholesterolemia 06/03/2018   Malnutrition of moderate degree 07/12/2017   Hypoalbuminemia 07/11/2017   Elevated LFTs 07/11/2017   Snoring 12/21/2016   Peripheral musculoskeletal gait disorder 02/05/2015   Lumbar post-laminectomy syndrome 02/05/2015   CAD in native artery 10/16/2014   NSTEMI (non-ST elevated myocardial infarction) ([place]) 09/13/2014   Cardiomyopathy, ischemic 19/41/7408   Chronic systolic heart failure ([place])    Tobacco abuse 09/12/2014   Dyslipidemia 09/12/2014   Normocytic anemia 09/12/2014   Chronic diastolic heart failure, NYHA class 1 ([place]) 09/12/2014   Postlaminectomy syndrome, cervical region 08/01/2013   Chronic midline low back pain with left-sided sciatica 08/01/2013   Shoulder joint contracture 08/01/2013   HTN (hypertension) 03/03/2013   Abnormal genetic test 03/03/2013    Conditions to be addressed/monitored: CHF, HTN, CKD Stage III, and Dementia; Level of care concerns  Care Plan : Social Work [facility] Care Plan  Updates made by [name] since 12/13/2020 12:00 AM     Problem: Barriers to Treatment Resolved 12/13/2020     Long-Range Goal: Barriers to Treatment Identified and Managed Completed 12/13/2020  Start Date: 06/01/2020  Expected End Date: 11/29/2020  Recent Progress: On track  Priority: Medium  Note:    Current Barriers:  Chronic disease management support and education needs related to CHF, HTN, CKD Stage III, and Cognitive Impairment   ADL IADL limitations Limited access to caregiver Memory Deficits  Social Worker Clinical Goal(s):  Over the next 120 days, patient will work with SW to identify and address any acute and/or chronic care coordination needs related to the self health management of CHF, HTN, CKD Stage III, and Cognitive Impairment   New 6.3.22 Patient and her son will follow up with DSS regarding Medicaid application status as directed by SW  CCM SW Interventions:  Inter-disciplinary care team collaboration (see longitudinal plan of care) Collaboration with [name], FNP regarding development and update of comprehensive plan of care as evidenced by provider attestation and co-signature Successful outbound call placed to the patients son to assess goal progression Determined [name] is no longer interested in completing Medicaid application as the process is "too frustrating" [name] reports the patient will remain in the home with him  Patient Goals/Self-Care Activities patient will: with the help of her son  - Contact SW as needed  Problem: Disease Progression      Goal: Disease Progression Managed   Start Date: 11/12/2020  Expected End Date: 01/11/2021  Priority: High  Note:   Current Barriers:  Chronic disease management support and education needs related to CHF, HTN, CKD Stage III, and Mild Cognitive Impairment   Increased Agitation  Social Worker Clinical Goal(s):  patient will work with SW to identify and address any acute and/or chronic care coordination needs related to the self health management of CHF, HTN, CKD Stage III, and Mild Cognitive Impairment   Patient and her son will work with SW to identify long term care options Patient, with the help of her son, will visit the ED to evaluate cause of increased agitation and combativeness Goal not  met  SW Interventions:  Inter-disciplinary care team collaboration (see longitudinal plan of care) Collaboration with [name], FNP regarding development and update of comprehensive plan of care as evidenced by provider attestation and co-signature Successful outbound call placed to the patients son [name]  Confirmed the patient returned home from rehab and at this time is doing well Discussed option to enroll with [facility] to provide more oversight and engagement for the patient during the day  Determined [name] is open to [facility] if the patient is agreeable Briefly spoke with the patient to review what the [facility] program offers Determined the patient is interested in learning more about [facility] Advised the patient SW would contact the [facility] program to request a SW contact the patient and her son to provide more information and discuss enrollment Unsuccessful outbound call placed to [facility] - voice message left regarding patient referral Submitted [facility] referral via [id number] requesting patient outreach to educate on the [facility] program Scheduled follow up call over the next 30 days Voice message received from [place] who indicates she has contacted the patients son and plans to meet with him Thursday 8.4 Collaboration with patients primary provider to inform of intervention and plan  Patient Goals/Self-Care Activities patient will: with the help of her son  -  Engage with [facility] to become more knowledgeable of the program -Contact SW as needed prior to next scheduled call  Follow Up Plan:  SW will follow up with patient and her son over the next month       Follow Up Plan: SW will follow up with patient by phone over the next month      [name], BSW, CDP Social Worker, Certified Dementia Practitioner [place] / [place] Management [phone]

## 2020-12-13 NOTE — Chronic Care Management (AMB) (Signed)
Chronic Care Management   CCM RN Visit Note  12/06/2020 Name: Sara Fox MRN: [id number] DOB: 12-May-1941  Subjective: Sara Fox is a 80 y.o. year old female who is a primary care patient of [name], [place]. The care management team was consulted for assistance with disease management and care coordination needs.    Engaged with patient by telephone for follow up visit in response to provider referral for case management and/or care coordination services.   Consent to Services:  The patient was given information about Chronic Care Management services, agreed to services, and gave verbal consent prior to initiation of services.  Please see initial visit note for detailed documentation.   Patient agreed to services and verbal consent obtained.   Assessment: Review of patient past medical history, allergies, medications, health status, including review of consultants reports, laboratory and other test data, was performed as part of comprehensive evaluation and provision of chronic care management services.   SDOH (Social Determinants of Health) assessments and interventions performed:    CCM Care Plan  Allergies  Allergen Reactions   Buprenorphine Hcl Shortness Of Breath    Throat swelling/trouble breathing and lethargic   Morphine And Related Shortness Of Breath    Throat swelling/trouble breathing and lethargic   Celebrex [Celecoxib] Diarrhea and Swelling    Swelling of legs    Vioxx [Rofecoxib] Palpitations   Codeine Other (See Comments)    Bloated     Outpatient Encounter Medications as of 12/06/2020  Medication Sig   aspirin EC 81 MG tablet Take 81 mg by mouth daily as needed (headache).   Calcium Citrate-Vitamin D (CALCIUM CITRATE+D3 PETITES PO) Take 1 tablet by mouth daily.   cholecalciferol (VITAMIN D) 25 MCG (1000 UNIT) tablet Take 1,000 Units by mouth daily.   ciclopirox (PENLAC) 8 % solution Apply 1 application topically at bedtime. Apply over nail and  surrounding skin. Apply daily over previous coat. After seven (7) days, file nail and continue cycle.   [employer] MAGNESIUM OXIDE 250 MG TABS TAKE 1 TABLET BY MOUTH WITH EVENING MEAL DAILY   donepezil (ARICEPT) 10 MG tablet Take 1 tablet (10 mg total) by mouth at bedtime.   ferrous sulfate 325 (65 FE) MG tablet Take 325 mg by mouth daily with breakfast.   furosemide (LASIX) 20 MG tablet TAKE 1 TABLET BY MOUTH EVERY OTHER DAY   gabapentin (NEURONTIN) 100 MG capsule Take 1 capsule (100 mg total) by mouth 2 (two) times daily.   ibuprofen (ADVIL) 200 MG tablet Take 400 mg by mouth every 6 (six) hours as needed (pain).   memantine (NAMENDA) 5 MG tablet TAKE 1 TABLET (5 MG TOTAL) BY MOUTH DAILY.   metoprolol tartrate (LOPRESSOR) 25 MG tablet TAKE 1 TABLET BY MOUTH TWICE A DAY   Multiple Vitamin (MULTIVITAMIN WITH MINERALS) TABS tablet Take 1 tablet by mouth daily.   nitroGLYCERIN (NITROSTAT) 0.4 MG SL tablet PLACE ONE TABLET UNDER THE TONGUE EVERY 5 MINUTES AS NEEDED FOR CHEST PAIN.   oxybutynin (DITROPAN) 5 MG tablet Take 0.5 tablets (2.5 mg total) by mouth 2 (two) times daily.   pantoprazole (PROTONIX) 40 MG tablet Take 1 tablet (40 mg total) by mouth 2 (two) times daily.   polyethylene glycol (MIRALAX / GLYCOLAX) packet Take 17 g by mouth daily as needed for mild constipation.   Potassium Chloride ER 20 MEQ TBCR TAKE 2 TABLETS BY MOUTH EVERY MORNING   QUEtiapine (SEROQUEL) 50 MG tablet TAKE 1 TABLET BY MOUTH EVERYDAY AT BEDTIME  rosuvastatin (CRESTOR) 40 MG tablet TAKE 1 TABLET BY MOUTH ONCE DAILY   SYMBICORT 160-4.5 MCG/ACT inhaler TAKE 2 PUFFS BY MOUTH TWICE A DAY   traMADol (ULTRAM) 50 MG tablet TAKE 1 TABLET 3 TIMES A DAY AND CAN TAKE 1 EXTRA 20 DAYS/MONTH   [DISCONTINUED] metoprolol tartrate (LOPRESSOR) 25 MG tablet TAKE 1 TABLET BY MOUTH TWICE A DAY   No facility-administered encounter medications on file as of 12/06/2020.    Patient Active Problem List   Diagnosis Date Noted   Fall (on)(from)  incline, initial encounter 11/23/2020   Macrocytosis without anemia 11/22/2020   Unwitnessed fall 11/22/2020   Late onset Alzheimer's dementia with behavioral disturbance ([name]) 11/18/2020   Visual hallucination 11/18/2020   Sundowning 11/18/2020   Non-small cell carcinoma of right lung, stage 1 ([place]) 06/08/2020   Pulmonary nodule 1 cm or greater in diameter 05/21/2020   Atherosclerotic heart disease of native coronary artery with other forms of angina pectoris ([place]) 04/22/2020   Memory loss 02/18/2019   Vitamin B12 deficiency 10/23/2018   Stage 3 chronic kidney disease ([place]) 06/03/2018   Chronic anemia 06/03/2018   Hypercholesterolemia 06/03/2018   Malnutrition of moderate degree 07/12/2017   Hypoalbuminemia 07/11/2017   Elevated LFTs 07/11/2017   Snoring 12/21/2016   Peripheral musculoskeletal gait disorder 02/05/2015   Lumbar post-laminectomy syndrome 02/05/2015   CAD in native artery 10/16/2014   NSTEMI (non-ST elevated myocardial infarction) ([place]) 09/13/2014   Cardiomyopathy, ischemic 36/64/4034   Chronic systolic heart failure ([place])    Tobacco abuse 09/12/2014   Dyslipidemia 09/12/2014   Normocytic anemia 09/12/2014   Chronic diastolic heart failure, NYHA class 1 ([place]) 09/12/2014   Postlaminectomy syndrome, cervical region 08/01/2013   Chronic midline low back pain with left-sided sciatica 08/01/2013   Shoulder joint contracture 08/01/2013   HTN (hypertension) 03/03/2013   Abnormal genetic test 03/03/2013    Conditions to be addressed/monitored: Stage 3a chronic kidney disease, HTN, chronic systolic heart failure, Mild Cognitive Impairment, Atherosclerotic heart disease of native coronary artery with other forms of angina pectoris, Lung Nodule    Care Plan : Dementia (Adult)  Updates made by Lynne Logan, RN since 12/06/2020 12:00 AM     Problem: Cognitive Function   Priority: High     Long-Range Goal: Optimal Cognitive Function   Start Date: 07/26/2020  Expected  End Date: 07/26/2021  Recent Progress: On track  Priority: High  Note:   Current Barriers:  Ineffective Self Health Maintenance Unable to self administer medications as prescribed Unable to perform ADLs independently Unable to perform IADLs independently Cognitive deficit  Clinical Goal(s):  Collaboration with [name], FNP regarding development and update of comprehensive plan of care as evidenced by provider attestation and co-signature Inter-disciplinary care team collaboration (see longitudinal plan of care) patient will work with care management team to address care coordination and chronic disease management needs related to Disease Management Educational Needs Care Coordination Medication Management and Education Psychosocial Support Dementia and Caregiver Support   Interventions:  12/06/20 completed inbound call with son [name] Evaluation of current treatment plan related to Dementia and patient's adherence to plan as established by provider. Collaboration with [name], FNP regarding development and update of comprehensive plan of care as evidenced by provider attestation       and co-signature Inter-disciplinary care team collaboration (see longitudinal plan of care) Determined patient remains to be in a SNF for rehabilitation  Determined her son [name] is calling today with questions concerning the financial responsibility for having  his mother transitioned into a long term care  Collaborated with embedded BSW [name] to determine she will contact [name] to assist with his questions and discuss additional concerns related to long term care Discussed plans with patient for ongoing care management follow up and provided patient with direct contact information for care management team Self Care Activities:  Continue to keep all MD follow up appointments  Continue to take all medications exactly as prescribed  Continue to call the pharmacy for refills at least  7 days before taking last dose Notify the CCM team and or PCP if you are unable to afford your medication refills Call the CCM team and or PCP for questions or concerns  Patient Goals: - work with embedded BSW for long term care planning   Follow Up Plan: Telephone follow up appointment with care management team member scheduled for: 12/20/20     Plan:Telephone follow up appointment with care management team member scheduled for:  12/20/20  Barb Merino, RN, BSN, CCM Care Management Coordinator [place] [facility]  Direct Phone: [phone]

## 2020-12-13 NOTE — Patient Instructions (Signed)
Goals Addressed      Optimal Cognitive Function   On track    Timeframe:  Long-Range Goal Priority:  Medium Start Date: 07/26/20                             Expected End Date: 07/26/21                     Next Follow Up date: 12/20/20  Self Care Activities:  Continue to keep all MD follow up appointments  Continue to take all medications exactly as prescribed  Continue to call the pharmacy for refills at least 7 days before taking last dose Notify the CCM team and or PCP if you are unable to afford your medication refills Call the CCM team and or PCP for questions or concerns  Patient Goals: - work with embedded BSW for long term care planning

## 2020-12-13 NOTE — Patient Instructions (Signed)
Social Worker Visit Information  Goals we discussed today:   Goals Addressed             This Visit's Progress    Disease Progression Managed       Timeframe:  Short-Term Goal Priority:  High Start Date:  7.1.22                           Expected End Date: 8.30.22                         Patient Goals/Self-Care Activities patient will: with the help of her son  - Engage with [facility] to become more knowledgeable of the program -Contact SW as needed prior to next scheduled call     COMPLETED: Work with SW to identify and manage barriers to treatment       Timeframe:  Long-Range Goal Priority:  [employer] Start Date:  1.18.22                           Expected End Date: 7.18.22                     Patient Goals/Self-Care Activities  patient will: with the help of her son  - Sport and exercise psychologist SW as needed          Materials Provided: Verbal education about [facility] provided by phone  Follow Up Plan: SW will follow up with patient by phone over the next month   [name], BSW, CDP Social Worker, Certified Dementia Practitioner [place] [facility] [place] [facility] [phone]

## 2020-12-15 ENCOUNTER — Other Ambulatory Visit: Payer: Self-pay

## 2020-12-15 ENCOUNTER — Ambulatory Visit (INDEPENDENT_AMBULATORY_CARE_PROVIDER_SITE_OTHER): Payer: Medicare Other | Admitting: Nurse Practitioner

## 2020-12-15 DIAGNOSIS — Z23 Encounter for immunization: Secondary | ICD-10-CM

## 2020-12-15 DIAGNOSIS — F0281 Dementia in other diseases classified elsewhere with behavioral disturbance: Secondary | ICD-10-CM

## 2020-12-15 DIAGNOSIS — C3491 Malignant neoplasm of unspecified part of right bronchus or lung: Secondary | ICD-10-CM

## 2020-12-15 DIAGNOSIS — G301 Alzheimer's disease with late onset: Secondary | ICD-10-CM

## 2020-12-15 DIAGNOSIS — W102XXA Fall (on)(from) incline, initial encounter: Secondary | ICD-10-CM

## 2020-12-15 DIAGNOSIS — N390 Urinary tract infection, site not specified: Secondary | ICD-10-CM | POA: Diagnosis not present

## 2020-12-15 DIAGNOSIS — I10 Essential (primary) hypertension: Secondary | ICD-10-CM | POA: Diagnosis not present

## 2020-12-15 DIAGNOSIS — H6123 Impacted cerumen, bilateral: Secondary | ICD-10-CM

## 2020-12-15 DIAGNOSIS — F02818 Dementia in other diseases classified elsewhere, unspecified severity, with other behavioral disturbance: Secondary | ICD-10-CM

## 2020-12-15 LAB — POCT URINALYSIS DIPSTICK
Bilirubin, UA: NEGATIVE
Blood, UA: NEGATIVE
Glucose, UA: NEGATIVE
Ketones, UA: NEGATIVE
Leukocytes, UA: NEGATIVE
Nitrite, UA: NEGATIVE
Protein, UA: POSITIVE — AB
Spec Grav, UA: 1.02 (ref 1.010–1.025)
Urobilinogen, UA: 0.2 E.U./dL
pH, UA: 6 (ref 5.0–8.0)

## 2020-12-15 LAB — POCT UA - MICROALBUMIN
Creatinine, POC: 200 mg/dL
Microalbumin Ur, POC: 150 mg/L

## 2020-12-15 MED ORDER — SHINGRIX 50 MCG/0.5ML IM SUSR: 0.5000 mL | Freq: Once | INTRAMUSCULAR | 0 refills | Status: AC

## 2020-12-15 NOTE — Progress Notes (Signed)
This visit occurred during the SARS-CoV-2 public health emergency.  Safety protocols were in place, including screening questions prior to the visit, additional usage of staff PPE, and extensive cleaning of exam room while observing appropriate contact time as indicated for disinfecting solutions.  Subjective:     Patient ID: Sara Fox , female    DOB: [birth year] , 80 y.o.   MRN: [id number]   Chief Complaint  Patient presents with   Hospitalization Follow-up    HPI  She was here for her HM but has just come home from [facility] on Saturday according to her son.  She is eating well according to the patient. She reports she has seen another provider since being home but is unable to say the name.  She states "how stupid am I" then states "that is what I am called"  She came home this past Saturday. She continues to have the behaviors. No further falls since being home.   She has a wound to her buttocks since being in the nursing.  She is getting around at home well.  She was at [facility] she had only done one treatment.  She was admitted to the hospital for UTI after having a fall.  She smells of urine. Her shirt is extremely dirty today    Past Medical History:  Diagnosis Date   Acute kidney injury ([place])    Back pain    Cervical cancer (H[facility])    CHF (congestive heart failure) (H[facility])    Closed fracture of left distal radius 05/28/2017   COPD (chronic obstructive pulmonary disease) (H[facility])    Coronary artery disease    s/p DES x2 to RCA 2016   GERD (gastroesophageal reflux disease)    History of radiation therapy 07/27/20-08/16/20   Right lung- SBRT- Dr. Gery Pray    Hyperlipidemia    Hypertension    Lower leg edema 12/21/2016   Mild cognitive impairment    Myocardial infarction [facility])    Neck pain    Rhabdomyolysis    Stomach problems      Family History  Problem Relation Age of Onset   Heart disease Mother        also HTN   Diabetes Mother    Colon cancer  Mother    Heart disease Brother        deceased at 47   Hypertension Brother    Heart attack Brother    Heart disease Brother    Hypertension Brother      Current Outpatient Medications:    aspirin EC 81 MG tablet, Take 81 mg by mouth daily as needed (headache)., Disp: , Rfl:    budesonide-formoterol (SYMBICORT) 160-4.5 MCG/ACT inhaler, TAKE 2 PUFFS BY MOUTH TWICE A DAY, Disp: 30.6 each, Rfl: 1   Calcium Citrate-Vitamin D (CALCIUM CITRATE+D3 PETITES PO), Take 1 tablet by mouth daily., Disp: , Rfl:    cholecalciferol (VITAMIN D) 25 MCG (1000 UNIT) tablet, Take 1,000 Units by mouth daily., Disp: , Rfl:    ciclopirox (PENLAC) 8 % solution, Apply 1 application topically at bedtime. Apply over nail and surrounding skin. Apply daily over previous coat. After seven (7) days, file nail and continue cycle., Disp: , Rfl:    CVS MAGNESIUM OXIDE 250 MG TABS, TAKE 1 TABLET BY MOUTH WITH EVENING MEAL DAILY, Disp: 90 tablet, Rfl: 1   donepezil (ARICEPT) 10 MG tablet, Take 1 tablet (10 mg total) by mouth at bedtime., Disp: 90 tablet, Rfl: 1   ferrous sulfate 325 (65  FE) MG tablet, Take 325 mg by mouth daily with breakfast., Disp: , Rfl:    furosemide (LASIX) 20 MG tablet, Take 1 tablet by mouth Mon - Fri daily, Disp: 80 tablet, Rfl: 1   gabapentin (NEURONTIN) 100 MG capsule, Take 1 capsule (100 mg total) by mouth 2 (two) times daily., Disp: 180 capsule, Rfl: 1   ibuprofen (ADVIL) 200 MG tablet, Take 400 mg by mouth every 6 (six) hours as needed (pain)., Disp: , Rfl:    memantine (NAMENDA) 5 MG tablet, Take 1 tablet (5 mg total) by mouth daily., Disp: 90 tablet, Rfl: 1   metoprolol tartrate (LOPRESSOR) 25 MG tablet, Take 1 tablet (25 mg total) by mouth 2 (two) times daily., Disp: 180 tablet, Rfl: 0   Multiple Vitamin (MULTIVITAMIN WITH MINERALS) TABS tablet, Take 1 tablet by mouth daily., Disp: , Rfl:    nitroGLYCERIN (NITROSTAT) 0.4 MG SL tablet, PLACE ONE TABLET UNDER THE TONGUE EVERY 5 MINUTES AS NEEDED  FOR CHEST PAIN., Disp: 25 tablet, Rfl: 1   oxybutynin (DITROPAN) 5 MG tablet, Take 0.5 tablets (2.5 mg total) by mouth 2 (two) times daily., Disp: 180 tablet, Rfl: 0   pantoprazole (PROTONIX) 40 MG tablet, Take 1 tablet (40 mg total) by mouth 2 (two) times daily., Disp: 180 tablet, Rfl: 1   polyethylene glycol (MIRALAX / GLYCOLAX) packet, Take 17 g by mouth daily as needed for mild constipation., Disp: , Rfl:    Potassium Chloride ER 20 MEQ TBCR, Take 2 tablets by mouth every morning., Disp: 180 tablet, Rfl: 0   QUEtiapine (SEROQUEL) 50 MG tablet, TAKE 1 TABLET BY MOUTH EVERYDAY AT BEDTIME, Disp: 90 tablet, Rfl: 1   rosuvastatin (CRESTOR) 40 MG tablet, Take 1 tablet (40 mg total) by mouth daily., Disp: 90 tablet, Rfl: 0   traMADol (ULTRAM) 50 MG tablet, TAKE 1 TABLET 3 TIMES A DAY AND CAN TAKE 1 EXTRA 20 DAYS/MONTH, Disp: 110 tablet, Rfl: 0   Allergies  Allergen Reactions   Buprenorphine Hcl Shortness Of Breath    Throat swelling/trouble breathing and lethargic   Morphine And Related Shortness Of Breath    Throat swelling/trouble breathing and lethargic   Celebrex [Celecoxib] Diarrhea and Swelling    Swelling of legs    Vioxx [Rofecoxib] Palpitations   Codeine Other (See Comments)    Bloated      Review of Systems  Constitutional: Negative.   HENT: Negative.    Eyes: Negative.   Respiratory: Negative.  Negative for cough.   Cardiovascular: Negative.  Negative for chest pain, palpitations and leg swelling.  Gastrointestinal: Negative.   Endocrine: Negative.   Genitourinary: Negative.   Musculoskeletal: Negative.   Skin: Negative.   Allergic/Immunologic: Negative.   Neurological: Negative.   Hematological: Negative.   Psychiatric/Behavioral:  Positive for behavioral problems.        Poor memory, she is not able to be consistent with her communication and make sense    Today's Vitals   12/15/20 1135  BP: 100/60  Pulse: 91  Temp: 98.7 F (37.1 C)  TempSrc: Oral  Weight: 129  lb (58.5 kg)  Height: 5\' 5"  (1.651 m)   Body mass index is 21.47 kg/m.   Objective:  Physical Exam Vitals reviewed.  Constitutional:      General: She is not in acute distress.    Appearance: Normal appearance.  Cardiovascular:     Rate and Rhythm: Normal rate and regular rhythm.     Pulses: Normal pulses.  Heart sounds: Normal heart sounds. No murmur heard. Pulmonary:     Effort: Pulmonary effort is normal. No respiratory distress.     Breath sounds: Normal breath sounds. No wheezing.  Skin:    Capillary Refill: Capillary refill takes less than 2 seconds.  Neurological:     General: No focal deficit present.     Mental Status: She is alert and oriented to person, place, and time.     Cranial Nerves: No cranial nerve deficit.     Motor: No weakness.  Psychiatric:        Mood and Affect: Mood normal.        Behavior: Behavior normal.        Thought Content: Thought content normal.        Judgment: Judgment normal.        Assessment And Plan:     1. Urinary tract infection without hematuria, site unspecified Admitted to hospital on 7/11-7/16 after having a fall and found to have a UTI. She was treated with antibiotics and went to Rehab after discharge.   2. Non-small cell carcinoma of right lung, stage 1 ([place]) Currently stable and continue follow up with Oncology  3. Late onset Alzheimer's dementia with behavioral disturbance [facility]) Her son continues to be concerned about her memory and had verbalized being interested in placing his mom in rehab.   4. Bilateral impacted cerumen Water lavage done with good results - Ear Lavage  5. Encounter for immunization Rx sent to pharmacy - Zoster Vaccine Adjuvanted [facility]) injection; Inject 0.5 mLs into the muscle once for 1 dose.  Dispense: 0.5 mL; Refill: 0 - Pneumococcal polysaccharide vaccine 23-valent greater than or equal to 2yo subcutaneous/IM  6. Essential hypertension B/P is controlled.  CMP ordered to check  renal function.  The importance of regular exercise and dietary modification was stressed to the patient.  - POCT Urinalysis Dipstick (81002) - POCT UA - Microalbumin  7. Fall (on)(from) incline, initial encounter Had a fall without significant injury, she was found to have a urinary tract infection   Patient was given opportunity to ask questions. Patient verbalized understanding of the plan and was able to repeat key elements of the plan. All questions were answered to their satisfaction.  [name], FNP   I, [name], FNP, have reviewed all documentation for this visit. The documentation on 12/15/20 for the exam, diagnosis, procedures, and orders are all accurate and complete.   IF YOU HAVE BEEN REFERRED TO A SPECIALIST, IT MAY TAKE 1-2 WEEKS TO SCHEDULE/PROCESS THE REFERRAL. IF YOU HAVE NOT HEARD FROM US/SPECIALIST IN TWO WEEKS, PLEASE GIVE [place] A CALL AT [phone].   THE PATIENT IS ENCOURAGED TO PRACTICE SOCIAL DISTANCING DUE TO THE COVID-19 PANDEMIC.

## 2020-12-15 NOTE — Patient Instructions (Signed)

## 2020-12-16 ENCOUNTER — Telehealth: Payer: Self-pay | Admitting: Physical Medicine & Rehabilitation

## 2020-12-16 ENCOUNTER — Telehealth: Payer: Self-pay

## 2020-12-16 ENCOUNTER — Telehealth: Payer: Self-pay | Admitting: Internal Medicine

## 2020-12-16 ENCOUNTER — Other Ambulatory Visit: Payer: Self-pay

## 2020-12-16 DIAGNOSIS — G4700 Insomnia, unspecified: Secondary | ICD-10-CM

## 2020-12-16 DIAGNOSIS — G3184 Mild cognitive impairment, so stated: Secondary | ICD-10-CM

## 2020-12-16 MED ORDER — PANTOPRAZOLE SODIUM 40 MG PO TBEC: 40.0000 mg | DELAYED_RELEASE_TABLET | Freq: Two times a day (BID) | ORAL | 1 refills | Status: DC

## 2020-12-16 MED ORDER — QUETIAPINE FUMARATE 50 MG PO TABS: ORAL_TABLET | ORAL | 1 refills | Status: AC

## 2020-12-16 MED ORDER — MAGNESIUM OXIDE -MG SUPPLEMENT 250 MG PO TABS: ORAL_TABLET | ORAL | 1 refills | Status: DC

## 2020-12-16 MED ORDER — FUROSEMIDE 20 MG PO TABS: 20.0000 mg | ORAL_TABLET | ORAL | 1 refills | Status: DC

## 2020-12-16 MED ORDER — MEMANTINE HCL 5 MG PO TABS
5.0000 mg | ORAL_TABLET | Freq: Every day | ORAL | 1 refills | Status: AC
Start: 2020-12-16 — End: ?

## 2020-12-16 MED ORDER — BUDESONIDE-FORMOTEROL FUMARATE 160-4.5 MCG/ACT IN AERO: INHALATION_SPRAY | RESPIRATORY_TRACT | 1 refills | Status: AC

## 2020-12-16 MED ORDER — GABAPENTIN 100 MG PO CAPS: 100.0000 mg | ORAL_CAPSULE | Freq: Two times a day (BID) | ORAL | 1 refills | Status: AC

## 2020-12-16 MED ORDER — DONEPEZIL HCL 10 MG PO TABS: 10.0000 mg | ORAL_TABLET | Freq: Every day | ORAL | 1 refills | Status: AC

## 2020-12-16 NOTE — Telephone Encounter (Signed)
[name] with [facility] needs to get a refill for patient's Tramadol sent to [facility].  Please call her at [phone].

## 2020-12-16 NOTE — Telephone Encounter (Signed)
*  STAT* If patient is at the pharmacy, call can be transferred to refill team.   1. Which medications need to be refilled? (please list name of each medication and dose if known)  metoprolol tartrate (LOPRESSOR) 25 MG tablet rosuvastatin (CRESTOR) 40 MG tablet Potassium Chloride ER 20 MEQ TBCR  nitroGLYCERIN (NITROSTAT) 0.4 MG SL tablet   2. Which pharmacy/location (including street and city if local pharmacy) is medication to be sent to? [facility] [address] [place], [place] [address] [phone]   3. Do they need a 30 day or 90 day supply? 90 day supply Refills were recently sent to [facility] but scripts were not picked up.   Moving forward please send all refills to the following pharmacy listed above

## 2020-12-16 NOTE — Chronic Care Management (AMB) (Signed)
Chronic Care Management Pharmacy Assistant   Name: Sara Fox  MRN: [id number] DOB: 10/30/1940  Reason for Encounter: Medication Coordination for Enhanced Pharmacy Services   12/16/2020- Called patient son to follow up on [facility] services and to check on patient status, patient is home and hospital follow up with her PCP- [name], FNP was 12/15/2020. Pharmacy form updated for first delivery schedule on 12/22/2020. Orlando Penner, CPP notified.  Called [facility] to have all medications that were due for pick up placed back and reversed with pharmacy, informed [facility]'s profile will need to be cancelled, she is starting with Adherence Packaging program with [facility].  Called [facility] requesting refill of Metoprolol, Rosuvastatin, Metoprolol and Nitroglycerin to [facility], gave verbal request to front staff to send to clinical team. Notified provider staff that patient did not pick recent prescriptions up from [facility] and those orders were cancelled.   Sent request to PCP to send in refills of Namenda, Seroquel, Gabapentin, Aricept, Lasix, Protonix, Symbicort and Magnesium to [facility]. Notified provider staff that patient did not pick recent prescriptions [facility] from [facility] and those orders were cancelled.   Called [facility] to request refill of Tramadol to be sent to [facility] to be placed on hold. Left message on clinical staff voicemail. Received a call back from Dr Elnita Maxwell missed call, message left that he has not seen patient in a year, unable to fill medication. Sent request to PCP to inquire if she will start to prescribe medication for patient. Waiting on response.  12/24/2020- Patient son contact [facility] inquiring on delivery, discussing with Orlando Penner and [facility] of status, will follow up with son. Patient was to get a delivery on 12/22/2020.   12/27/2020- Spoke with son,  due to insurance we are unable to fill all medications in packaging but there are some medications scheduled to be delivered. Patient son was frustrated with the process and I apologized and explained the good intentions on trying to help patient with adherence on her medications and alleviate some stress on him. Son aware and understands, we plan on discussing a few medication on 12/28/2020, [facility] has a delivery scheduled for today but will hold until tomorrow or Wednesday after speaking with son and he did confirm patient is not out of any medications.   12/28/2020- Called son to follow up on medications, no answer, left message to return call.   12/29/2020- Called son again to follow up on medications. Per son patient has a week left of medications, he is aware [facility] will deliver all of her medications tomorrow morning 12/30/2020. Onboard form updated with last fills on medications. Called [facility] to make sure medications recently sent in were not filled. [facility], Metoprolol, Gabapentin, Furosemide and Namenda were not picked up, they are deleting refill and returning to insurance. They are aware that patient will be getting her medications delivered through [facility] Adherence packaging program. Still investigation Tramadol refill, last refill sent in by Kathe Becton, NP, called contact number to discuss medication, no answer, no voicemail available, still awaiting response from PCP on if she will control medications and if refill can be sent. Orlando Penner CPP notified and will follow up also. [facility] notified of updated form and to prepare for 12/30/2020 am delivery.    Medications: Outpatient Encounter Medications as of 12/16/2020  Medication Sig   aspirin EC 81 MG tablet Take  81 mg by mouth daily as needed (headache).   Calcium Citrate-Vitamin D (CALCIUM CITRATE+D3 PETITES PO) Take 1 tablet by mouth daily.   cholecalciferol (VITAMIN D) 25 MCG  (1000 UNIT) tablet Take 1,000 Units by mouth daily.   ciclopirox (PENLAC) 8 % solution Apply 1 application topically at bedtime. Apply over nail and surrounding skin. Apply daily over previous coat. After seven (7) days, file nail and continue cycle.   [facility] MAGNESIUM OXIDE 250 MG TABS TAKE 1 TABLET BY MOUTH WITH EVENING MEAL DAILY   donepezil (ARICEPT) 10 MG tablet Take 1 tablet (10 mg total) by mouth at bedtime.   ferrous sulfate 325 (65 FE) MG tablet Take 325 mg by mouth daily with breakfast.   furosemide (LASIX) 20 MG tablet TAKE 1 TABLET BY MOUTH EVERY OTHER DAY   gabapentin (NEURONTIN) 100 MG capsule Take 1 capsule (100 mg total) by mouth 2 (two) times daily.   ibuprofen (ADVIL) 200 MG tablet Take 400 mg by mouth every 6 (six) hours as needed (pain).   memantine (NAMENDA) 5 MG tablet TAKE 1 TABLET (5 MG TOTAL) BY MOUTH DAILY.   metoprolol tartrate (LOPRESSOR) 25 MG tablet TAKE 1 TABLET BY MOUTH TWICE A DAY   Multiple Vitamin (MULTIVITAMIN WITH MINERALS) TABS tablet Take 1 tablet by mouth daily.   nitroGLYCERIN (NITROSTAT) 0.4 MG SL tablet PLACE ONE TABLET UNDER THE TONGUE EVERY 5 MINUTES AS NEEDED FOR CHEST PAIN.   oxybutynin (DITROPAN) 5 MG tablet Take 0.5 tablets (2.5 mg total) by mouth 2 (two) times daily.   pantoprazole (PROTONIX) 40 MG tablet Take 1 tablet (40 mg total) by mouth 2 (two) times daily.   polyethylene glycol (MIRALAX / GLYCOLAX) packet Take 17 g by mouth daily as needed for mild constipation.   Potassium Chloride ER 20 MEQ TBCR TAKE 2 TABLETS BY MOUTH EVERY MORNING   QUEtiapine (SEROQUEL) 50 MG tablet TAKE 1 TABLET BY MOUTH EVERYDAY AT BEDTIME   rosuvastatin (CRESTOR) 40 MG tablet TAKE 1 TABLET BY MOUTH ONCE DAILY   SYMBICORT 160-4.5 MCG/ACT inhaler TAKE 2 PUFFS BY MOUTH TWICE A DAY   traMADol (ULTRAM) 50 MG tablet TAKE 1 TABLET 3 TIMES A DAY AND CAN TAKE 1 EXTRA 20 DAYS/MONTH   No facility-administered encounter medications on file as of 12/16/2020.    Care Gaps: Zoster  Vaccines- Shingrix- Overdue - never done PNA vac Low Risk Adult (2 of 2 - PPSV23)- Last completed: Aug 30, 2011 COVID-19 Vaccine (3 - Pfizer risk series)- Last completed: Sep 25, 2019 INFLUENZA VACCINE- Due December 13, 2020 Annual Wellness Exam scheduled for 12/23/2020.  Star Rating Drugs: Rosuvastatin 40 mg- Last filled 11/28/2020 for a 30 day supply at [facility]  Follow up visit with CCM Pharmacist- Orlando Penner on 02/03/2021 at 2:45 PM  Pattricia Boss, [place] Pharmacist Assistant [phone]

## 2020-12-17 MED ORDER — NITROGLYCERIN 0.4 MG SL SUBL: SUBLINGUAL_TABLET | SUBLINGUAL | 1 refills | Status: AC

## 2020-12-17 MED ORDER — POTASSIUM CHLORIDE ER 20 MEQ PO TBCR: 2.0000 | EXTENDED_RELEASE_TABLET | Freq: Every morning | ORAL | 0 refills | Status: DC

## 2020-12-17 MED ORDER — ROSUVASTATIN CALCIUM 40 MG PO TABS: 40.0000 mg | ORAL_TABLET | Freq: Every day | ORAL | 0 refills | Status: AC

## 2020-12-17 MED ORDER — METOPROLOL TARTRATE 25 MG PO TABS: 25.0000 mg | ORAL_TABLET | Freq: Two times a day (BID) | ORAL | 0 refills | Status: AC

## 2020-12-20 ENCOUNTER — Telehealth: Payer: Medicare Other

## 2020-12-20 ENCOUNTER — Ambulatory Visit: Payer: Self-pay

## 2020-12-20 DIAGNOSIS — IMO0001 Reserved for inherently not codable concepts without codable children: Secondary | ICD-10-CM

## 2020-12-20 DIAGNOSIS — N1831 Chronic kidney disease, stage 3a: Secondary | ICD-10-CM

## 2020-12-20 DIAGNOSIS — I10 Essential (primary) hypertension: Secondary | ICD-10-CM

## 2020-12-20 DIAGNOSIS — G3184 Mild cognitive impairment, so stated: Secondary | ICD-10-CM

## 2020-12-20 DIAGNOSIS — R911 Solitary pulmonary nodule: Secondary | ICD-10-CM

## 2020-12-20 DIAGNOSIS — I25118 Atherosclerotic heart disease of native coronary artery with other forms of angina pectoris: Secondary | ICD-10-CM

## 2020-12-23 ENCOUNTER — Telehealth: Payer: Self-pay

## 2020-12-23 NOTE — Telephone Encounter (Signed)
This nurse called patient in regards to missed AWV. Message left to call back in order to reschedule.

## 2020-12-24 ENCOUNTER — Ambulatory Visit: Payer: Self-pay

## 2020-12-24 ENCOUNTER — Telehealth: Payer: Medicare Other

## 2020-12-24 DIAGNOSIS — R911 Solitary pulmonary nodule: Secondary | ICD-10-CM

## 2020-12-24 DIAGNOSIS — N1831 Chronic kidney disease, stage 3a: Secondary | ICD-10-CM

## 2020-12-24 DIAGNOSIS — I10 Essential (primary) hypertension: Secondary | ICD-10-CM

## 2020-12-24 DIAGNOSIS — I25118 Atherosclerotic heart disease of native coronary artery with other forms of angina pectoris: Secondary | ICD-10-CM

## 2020-12-24 DIAGNOSIS — G3184 Mild cognitive impairment, so stated: Secondary | ICD-10-CM

## 2020-12-24 DIAGNOSIS — IMO0001 Reserved for inherently not codable concepts without codable children: Secondary | ICD-10-CM

## 2020-12-24 NOTE — Chronic Care Management (AMB) (Signed)
Chronic Care Management   CCM RN Visit Note  12/20/2020 Name: Sara Fox MRN: [id number] DOB: 09-06-1940  Subjective: Sara Fox is a 80 y.o. year old female who is a primary care patient of [name], [place]. The care management team was consulted for assistance with disease management and care coordination needs.    Engaged with patient by telephone for follow up visit in response to provider referral for case management and/or care coordination services.   Consent to Services:  The patient was given information about Chronic Care Management services, agreed to services, and gave verbal consent prior to initiation of services.  Please see initial visit note for detailed documentation.   Patient agreed to services and verbal consent obtained.   Assessment: Review of patient past medical history, allergies, medications, health status, including review of consultants reports, laboratory and other test data, was performed as part of comprehensive evaluation and provision of chronic care management services.   SDOH (Social Determinants of Health) assessments and interventions performed:    CCM Care Plan  Allergies  Allergen Reactions   Buprenorphine Hcl Shortness Of Breath    Throat swelling/trouble breathing and lethargic   Morphine And Related Shortness Of Breath    Throat swelling/trouble breathing and lethargic   Celebrex [Celecoxib] Diarrhea and Swelling    Swelling of legs    Vioxx [Rofecoxib] Palpitations   Codeine Other (See Comments)    Bloated     Outpatient Encounter Medications as of 12/20/2020  Medication Sig   aspirin EC 81 MG tablet Take 81 mg by mouth daily as needed (headache).   budesonide-formoterol (SYMBICORT) 160-4.5 MCG/ACT inhaler TAKE 2 PUFFS BY MOUTH TWICE A DAY   Calcium Citrate-Vitamin D (CALCIUM CITRATE+D3 PETITES PO) Take 1 tablet by mouth daily.   cholecalciferol (VITAMIN D) 25 MCG (1000 UNIT) tablet Take 1,000 Units by mouth daily.    ciclopirox (PENLAC) 8 % solution Apply 1 application topically at bedtime. Apply over nail and surrounding skin. Apply daily over previous coat. After seven (7) days, file nail and continue cycle.   donepezil (ARICEPT) 10 MG tablet Take 1 tablet (10 mg total) by mouth at bedtime.   ferrous sulfate 325 (65 FE) MG tablet Take 325 mg by mouth daily with breakfast.   furosemide (LASIX) 20 MG tablet Take 1 tablet (20 mg total) by mouth every other day.   gabapentin (NEURONTIN) 100 MG capsule Take 1 capsule (100 mg total) by mouth 2 (two) times daily.   ibuprofen (ADVIL) 200 MG tablet Take 400 mg by mouth every 6 (six) hours as needed (pain).   Magnesium Oxide -Mg Supplement ([facility] MAGNESIUM OXIDE) 250 MG TABS TAKE 1 TABLET BY MOUTH WITH EVENING MEAL DAILY   memantine (NAMENDA) 5 MG tablet Take 1 tablet (5 mg total) by mouth daily.   metoprolol tartrate (LOPRESSOR) 25 MG tablet Take 1 tablet (25 mg total) by mouth 2 (two) times daily.   Multiple Vitamin (MULTIVITAMIN WITH MINERALS) TABS tablet Take 1 tablet by mouth daily.   nitroGLYCERIN (NITROSTAT) 0.4 MG SL tablet PLACE ONE TABLET UNDER THE TONGUE EVERY 5 MINUTES AS NEEDED FOR CHEST PAIN.   oxybutynin (DITROPAN) 5 MG tablet Take 0.5 tablets (2.5 mg total) by mouth 2 (two) times daily.   pantoprazole (PROTONIX) 40 MG tablet Take 1 tablet (40 mg total) by mouth 2 (two) times daily.   polyethylene glycol (MIRALAX / GLYCOLAX) packet Take 17 g by mouth daily as needed for mild constipation.   Potassium Chloride  ER 20 MEQ TBCR Take 2 tablets by mouth every morning.   QUEtiapine (SEROQUEL) 50 MG tablet TAKE 1 TABLET BY MOUTH EVERYDAY AT BEDTIME   rosuvastatin (CRESTOR) 40 MG tablet Take 1 tablet (40 mg total) by mouth daily.   traMADol (ULTRAM) 50 MG tablet TAKE 1 TABLET 3 TIMES A DAY AND CAN TAKE 1 EXTRA 20 DAYS/MONTH   No facility-administered encounter medications on file as of 12/20/2020.    Patient Active Problem List   Diagnosis Date Noted   Fall  (on)(from) incline, initial encounter 11/23/2020   Macrocytosis without anemia 11/22/2020   Unwitnessed fall 11/22/2020   Late onset Alzheimer's dementia with behavioral disturbance ([place]) 11/18/2020   Visual hallucination 11/18/2020   Sundowning 11/18/2020   Non-small cell carcinoma of right lung, stage 1 ([place]) 06/08/2020   Pulmonary nodule 1 cm or greater in diameter 05/21/2020   Atherosclerotic heart disease of native coronary artery with other forms of angina pectoris ([place]) 04/22/2020   Memory loss 02/18/2019   Vitamin B12 deficiency 10/23/2018   Stage 3 chronic kidney disease ([place]) 06/03/2018   Chronic anemia 06/03/2018   Hypercholesterolemia 06/03/2018   Malnutrition of moderate degree 07/12/2017   Hypoalbuminemia 07/11/2017   Elevated LFTs 07/11/2017   Snoring 12/21/2016   Peripheral musculoskeletal gait disorder 02/05/2015   Lumbar post-laminectomy syndrome 02/05/2015   CAD in native artery 10/16/2014   NSTEMI (non-ST elevated myocardial infarction) ([place]) 09/13/2014   Cardiomyopathy, ischemic 11/05/7626   Chronic systolic heart failure ([place])    Tobacco abuse 09/12/2014   Dyslipidemia 09/12/2014   Normocytic anemia 09/12/2014   Chronic diastolic heart failure, NYHA class 1 ([place]) 09/12/2014   Postlaminectomy syndrome, cervical region 08/01/2013   Chronic midline low back pain with left-sided sciatica 08/01/2013   Shoulder joint contracture 08/01/2013   HTN (hypertension) 03/03/2013   Abnormal genetic test 03/03/2013    Conditions to be addressed/monitored: Stage 3a chronic kidney disease, HTN, chronic systolic heart failure, Mild Cognitive Impairment, Atherosclerotic heart disease of native coronary artery with other forms of angina pectoris, Lung Nodule    Care Plan : Cancer Treatment Phase (Adult)  Updates made by Lynne [place], RN since 12/20/2020 12:00 AM     Problem: Psychosocial Response to Cancer Diagnosis   Priority: High     Long-Range Goal: Optimal Coping    Start Date: 06/15/2020  Expected End Date: 06/15/2021  Recent Progress: On track  Priority: High  Note:   Current Barriers:  Ineffective Self Health Maintenance Lacks social connections Cognitive deficits  Currently UNABLE TO independently self manage needs related to chronic health conditions.  Knowledge Deficits related to short term plan for care coordination needs and long term plans for chronic disease management needs Clinical Goal(s):  Collaboration with [name], FNP regarding development and update of comprehensive plan of care as evidenced by provider attestation and co-signature Inter-disciplinary care team collaboration (see longitudinal plan of care) Patient will work with care management team to address care coordination and chronic disease management needs related to Disease Management Educational Needs Care Coordination Medication Management and Education Medication Reconciliation Psychosocial Support Dementia and Caregiver Support   Interventions:  12/20/20 completed successful outbound call with son [name]  Evaluation of current treatment plan related to Dementia and patient's adherence to plan as established by provider. Collaboration with [name], FNP regarding development and update of comprehensive plan of care as evidenced by provider attestation       and co-signature Inter-disciplinary care team collaboration (see longitudinal plan of care)  Review of patient status, including review of consultant's reports, relevant laboratory and other test results, and medications completed. Reviewed medications with patient and discussed importance of medication adherence Determined patient completed her Radiation treatment as directed by Oncologist, Dr. Mckinley Jewel Reviewed scheduled/upcoming provider appointments including: next Oncology follow up is scheduled for 04/29/21 labs, 12/20 Dr. Mckinley Jewel at 3:30 PM  Discussed plans with patient for ongoing care management  follow up and provided patient with direct contact information for care management team Patient Goals/Self Care Activities:  Keep all MD follow up appointments as scheduled Call your Oncology navigator for questions related to Cancer diagnosis and next steps Continue to work with PCP and CCM team for disease education and support and or for assistance with care coordination as needed   Follow Up Plan: Telephone follow up appointment with care management team member scheduled for: 05/06/21     Care Plan : Chronic Kidney (Adult)  Updates made by Lynne [place], RN since 12/20/2020 12:00 AM     Problem: Disease Progression   Priority: Medium     Long-Range Goal: Disease Progression Prevented or Minimized   Start Date: 07/26/2020  Expected End Date: 07/26/2021  Recent Progress: On track  Priority: Medium  Note:   Objective:  No results found for: HGBA1C Lab Results  Component Value Date   CREATININE 1.37 (H) 11/24/2020   CREATININE 1.27 (H) 11/23/2020   CREATININE 1.23 (H) 11/22/2020   No results found for: EGFR Current Barriers:  Ineffective Self Health Maintenance Unable to self administer medications as prescribed Unable to perform ADLs independently Unable to perform IADLs independently Cognitive deficit  Clinical Goal(s):  Collaboration with [name], FNP regarding development and update of comprehensive plan of care as evidenced by provider attestation and co-signature Inter-disciplinary care team collaboration (see longitudinal plan of care) patient will work with care management team to address care coordination and chronic disease management needs related to Disease Management Educational Needs Care Coordination Medication Management and Education Psychosocial Support Dementia and Caregiver Support   Interventions:  12/20/20 completed successful outbound call with son [name] and patient  Evaluation of current treatment plan related to CKD Stage III and  patient's adherence to plan as established by provider. Collaboration with [name], FNP regarding development and update of comprehensive plan of care as evidenced by provider attestation       and co-signature Inter-disciplinary care team collaboration (see longitudinal plan of care) Reviewed and discussed current GFR and educated on stages of renal disease Educated on importance of drinking plenty of water, at least 64 oz per day unless otherwise directed  Educated on importance of taking all medications exactly as prescribed and to keep all MD follow up appointments  Discussed plans with patient for ongoing care management follow up and provided patient with direct contact information for care management team Self Care Activities:  Continue to keep all MD follow up appointments for ongoing monitoring of renal fuction Continue to take all medications exactly as prescribed  Continue to call the pharmacy for refills at least 7 days before taking last dose Notify the CCM team and or PCP if you are unable to afford your medication refills Drink plenty of water (64 oz daily) unless otherwise directed  Call the CCM team and or PCP for questions or concerns  Patient Goals: - to maintain/improve renal function   Follow Up Plan: Telephone follow up appointment with care management team member scheduled for: 03/24/21    Care Plan : Dementia (Adult)  Updates made by Lynne [place], RN since 12/20/2020 12:00 AM     Problem: Cognitive Function   Priority: High     Long-Range Goal: Optimal Cognitive Function   Start Date: 07/26/2020  Expected End Date: 07/26/2021  Recent Progress: On track  Priority: High  Note:   Current Barriers:  Ineffective Self Health Maintenance Unable to self administer medications as prescribed Unable to perform ADLs independently Unable to perform IADLs independently Cognitive deficit  Clinical Goal(s):  Collaboration with [name], FNP regarding  development and update of comprehensive plan of care as evidenced by provider attestation and co-signature Inter-disciplinary care team collaboration (see longitudinal plan of care) patient will work with care management team to address care coordination and chronic disease management needs related to Disease Management Educational Needs Care Coordination Medication Management and Education Psychosocial Support Dementia and Caregiver Support   Interventions:  12/20/20 completed inbound call with son [name] and patient  Evaluation of current treatment plan related to Dementia and patient's adherence to plan as established by provider. Collaboration with [name], FNP regarding development and update of comprehensive plan of care as evidenced by provider attestation       and co-signature Inter-disciplinary care team collaboration (see longitudinal plan of care) Determined patient has been discharged from acute inpatient rehab to her home with her son Discussed son and patient are interested in pursuing PACE, patient missed a scheduled tour due to son's work schedule, he plans to reschedule for a near future visit Collaborated with embedded BSW [name] to determine she will contact [name] to assist with his questions and discuss additional concerns related to long term care Discussed plans with patient for ongoing care management follow up and provided patient with direct contact information for care management team Self Care Activities:  Continue to keep all MD follow up appointments  Continue to take all medications exactly as prescribed  Continue to call the pharmacy for refills at least 7 days before taking last dose Notify the CCM team and or PCP if you are unable to afford your medication refills Call the CCM team and or PCP for questions or concerns  Patient Goals: - work with embedded BSW for long term care planning   Follow Up Plan: Telephone follow up appointment with  care management team member scheduled for: 03/24/21     Care Plan : Urinary Incontinence (Adult)  Updates made by Lynne [place], RN since 12/20/2020 12:00 AM     Problem: Harm or Injury (Urinary Incontinence)   Priority: High     Long-Range Goal: Urinary incontinence complications prevented or minimized   Start Date: 11/19/2020  Expected End Date: 11/18/2021  This Visit's Progress: On track  Recent Progress: Not on track  Priority: High  Note:   Current Barriers:  Ineffective Self Health Maintenance in a patient with  Stage 3a chronic kidney disease, HTN, chronic systolic heart failure, Mild Cognitive Impairment, Atherosclerotic heart disease of native coronary artery with other forms of angina pectoris, Lung Nodule   Clinical Goal(s):  Collaboration with [name], FNP regarding development and update of comprehensive plan of care as evidenced by provider attestation and co-signature Inter-disciplinary care team collaboration (see longitudinal plan of care) patient will work with care management team to address care coordination and chronic disease management needs related to Disease Management Educational Needs Care Coordination Medication Management and Education Psychosocial Support Dementia and Caregiver Support   Interventions:  12/20/20 completed successful outbound call with son [name] and  patient  Evaluation of current treatment plan related to  Stage 3a chronic kidney disease, HTN, chronic systolic heart failure, Mild Cognitive Impairment, Atherosclerotic heart disease of native coronary artery with other forms of angina pectoris, Lung Nodule   ,  self-management and patient's adherence to plan as established by provider. Collaboration with [name], FNP regarding development and update of comprehensive plan of care as evidenced by provider attestation       and co-signature Inter-disciplinary care team collaboration (see longitudinal plan of care) Review of patient  status, including review of consultant's reports, relevant laboratory and other test results, and medications completed. Reviewed medications with patient and discussed importance of medication adherence Educated on importance of using good perineal hygiene, keeping skin clean and dry and performing daily skin inspections for early signs of skin breakdown caused urinary incontinence Educated on early signs of skin breakdown and when to notify the PCP for skin alteration or concerns  Discussed plans with patient for ongoing care management follow up and provided patient with direct contact information for care management team Self Care Activities:  Patient unable to perform Self Care Activities  Patient Goals: - Keep skin clean and dry - use good perineal hygiene - notify PCP promplty for early signs of skin alteration  Follow Up Plan: Telephone follow up appointment with care management team member scheduled for: 03/24/21    Plan:Telephone follow up appointment with care management team member scheduled for:  03/24/21  Barb Merino, RN, BSN, CCM Care Management Coordinator [place] [facility]  Direct Phone: [phone]

## 2020-12-24 NOTE — Patient Instructions (Signed)
Goals Addressed      Follow My Treatment Plan-Chronic Kidney   On track    Timeframe:  Long-Range Goal Priority:  Medium Start Date: 07/26/20                           Expected End Date:  07/26/21                 Follow Up Date: 03/24/21   - call for medicine refill 2 or 3 days before it runs out - call the doctor or nurse before I stop taking medicine - call the doctor or nurse to get help with side effects - keep follow-up appointments - set alarm to remind of prescription refill date(s) - set alarm to remind to take medications    Why is this important?   Staying as healthy as you can is very important. This may mean making changes if you smoke, don't exercise or eat poorly.  A healthy lifestyle is an important goal for you.  Following the treatment plan and making changes may be hard.  Try some of these steps to help keep the disease from getting worse.     Notes:      Optimal Cognitive Function   On track    Timeframe:  Long-Range Goal Priority:  Medium Start Date: 07/26/20                             Expected End Date: 07/26/21                     Next Follow Up date: 03/24/21  Self Care Activities:  Continue to keep all MD follow up appointments  Continue to take all medications exactly as prescribed  Continue to call the pharmacy for refills at least 7 days before taking last dose Notify the CCM team and or PCP if you are unable to afford your medication refills Call the CCM team and or PCP for questions or concerns  Patient Goals: - work with embedded BSW for long term care planning      Understand treatment plan for new Cancer diagnosis   On track    Timeframe:  Long-Range Goal Priority:  High Start Date: 06/15/20                            Expected End Date:  06/15/21  Follow up date: 05/06/21  Complete scheduled MRI and PET scans as directed Keep all MD follow up appointments as scheduled Call your Oncology navigator for questions related to Cancer diagnosis  and next steps Continue to work with PCP and CCM team for disease education and support and or for assistance with care coordination as needed                          Urinary Incontinence complications prevented or minimized   On track    Timeframe:  Short-Term Goal Priority:  High Start Date:  11/19/20                           Expected End Date:  11/19/21  Next Scheduled Follow up: 03/24/21      Self Care Activities:  Patient unable to perform Self Care Activities  Patient Goals: - Keep skin clean and dry -  use good perineal hygiene - notify PCP promplty for early signs of skin alteration

## 2020-12-24 NOTE — Chronic Care Management (AMB) (Signed)
Chronic Care Management   CCM RN Visit Note  12/24/2020 Name: Sara Fox MRN: [id number] DOB: 1940-10-16  Subjective: Sara Fox is a 80 y.o. year old female who is a primary care patient of [name], [place]. The care management team was consulted for assistance with disease management and care coordination needs.    Engaged with patient by telephone for follow up visit in response to provider referral for case management and/or care coordination services.   Consent to Services:  The patient was given information about Chronic Care Management services, agreed to services, and gave verbal consent prior to initiation of services.  Please see initial visit note for detailed documentation.   Patient agreed to services and verbal consent obtained.   Assessment: Review of patient past medical history, allergies, medications, health status, including review of consultants reports, laboratory and other test data, was performed as part of comprehensive evaluation and provision of chronic care management services.   SDOH (Social Determinants of Health) assessments and interventions performed:    CCM Care Plan  Allergies  Allergen Reactions   Buprenorphine Hcl Shortness Of Breath    Throat swelling/trouble breathing and lethargic   Morphine And Related Shortness Of Breath    Throat swelling/trouble breathing and lethargic   Celebrex [Celecoxib] Diarrhea and Swelling    Swelling of legs    Vioxx [Rofecoxib] Palpitations   Codeine Other (See Comments)    Bloated     Outpatient Encounter Medications as of 12/24/2020  Medication Sig   aspirin EC 81 MG tablet Take 81 mg by mouth daily as needed (headache).   budesonide-formoterol (SYMBICORT) 160-4.5 MCG/ACT inhaler TAKE 2 PUFFS BY MOUTH TWICE A DAY   Calcium Citrate-Vitamin D (CALCIUM CITRATE+D3 PETITES PO) Take 1 tablet by mouth daily.   cholecalciferol (VITAMIN D) 25 MCG (1000 UNIT) tablet Take 1,000 Units by mouth daily.    ciclopirox (PENLAC) 8 % solution Apply 1 application topically at bedtime. Apply over nail and surrounding skin. Apply daily over previous coat. After seven (7) days, file nail and continue cycle.   donepezil (ARICEPT) 10 MG tablet Take 1 tablet (10 mg total) by mouth at bedtime.   ferrous sulfate 325 (65 FE) MG tablet Take 325 mg by mouth daily with breakfast.   furosemide (LASIX) 20 MG tablet Take 1 tablet (20 mg total) by mouth every other day.   gabapentin (NEURONTIN) 100 MG capsule Take 1 capsule (100 mg total) by mouth 2 (two) times daily.   ibuprofen (ADVIL) 200 MG tablet Take 400 mg by mouth every 6 (six) hours as needed (pain).   Magnesium Oxide -Mg Supplement ([employer] MAGNESIUM OXIDE) 250 MG TABS TAKE 1 TABLET BY MOUTH WITH EVENING MEAL DAILY   memantine (NAMENDA) 5 MG tablet Take 1 tablet (5 mg total) by mouth daily.   metoprolol tartrate (LOPRESSOR) 25 MG tablet Take 1 tablet (25 mg total) by mouth 2 (two) times daily.   Multiple Vitamin (MULTIVITAMIN WITH MINERALS) TABS tablet Take 1 tablet by mouth daily.   nitroGLYCERIN (NITROSTAT) 0.4 MG SL tablet PLACE ONE TABLET UNDER THE TONGUE EVERY 5 MINUTES AS NEEDED FOR CHEST PAIN.   oxybutynin (DITROPAN) 5 MG tablet Take 0.5 tablets (2.5 mg total) by mouth 2 (two) times daily.   pantoprazole (PROTONIX) 40 MG tablet Take 1 tablet (40 mg total) by mouth 2 (two) times daily.   polyethylene glycol (MIRALAX / GLYCOLAX) packet Take 17 g by mouth daily as needed for mild constipation.   Potassium Chloride  ER 20 MEQ TBCR Take 2 tablets by mouth every morning.   QUEtiapine (SEROQUEL) 50 MG tablet TAKE 1 TABLET BY MOUTH EVERYDAY AT BEDTIME   rosuvastatin (CRESTOR) 40 MG tablet Take 1 tablet (40 mg total) by mouth daily.   traMADol (ULTRAM) 50 MG tablet TAKE 1 TABLET 3 TIMES A DAY AND CAN TAKE 1 EXTRA 20 DAYS/MONTH   No facility-administered encounter medications on file as of 12/24/2020.    Patient Active Problem List   Diagnosis Date Noted   Fall  (on)(from) incline, initial encounter 11/23/2020   Macrocytosis without anemia 11/22/2020   Unwitnessed fall 11/22/2020   Late onset Alzheimer's dementia with behavioral disturbance ([place]) 11/18/2020   Visual hallucination 11/18/2020   Sundowning 11/18/2020   Non-small cell carcinoma of right lung, stage 1 ([place]) 06/08/2020   Pulmonary nodule 1 cm or greater in diameter 05/21/2020   Atherosclerotic heart disease of native coronary artery with other forms of angina pectoris ([place]) 04/22/2020   Memory loss 02/18/2019   Vitamin B12 deficiency 10/23/2018   Stage 3 chronic kidney disease ([place]) 06/03/2018   Chronic anemia 06/03/2018   Hypercholesterolemia 06/03/2018   Malnutrition of moderate degree 07/12/2017   Hypoalbuminemia 07/11/2017   Elevated LFTs 07/11/2017   Snoring 12/21/2016   Peripheral musculoskeletal gait disorder 02/05/2015   Lumbar post-laminectomy syndrome 02/05/2015   CAD in native artery 10/16/2014   NSTEMI (non-ST elevated myocardial infarction) ([place]) 09/13/2014   Cardiomyopathy, ischemic 94/17/4081   Chronic systolic heart failure ([place])    Tobacco abuse 09/12/2014   Dyslipidemia 09/12/2014   Normocytic anemia 09/12/2014   Chronic diastolic heart failure, NYHA class 1 ([place]) 09/12/2014   Postlaminectomy syndrome, cervical region 08/01/2013   Chronic midline low back pain with left-sided sciatica 08/01/2013   Shoulder joint contracture 08/01/2013   HTN (hypertension) 03/03/2013   Abnormal genetic test 03/03/2013    Conditions to be addressed/monitored: Stage 3a chronic kidney disease, HTN, chronic systolic heart failure, Mild Cognitive Impairment, Atherosclerotic heart disease of native coronary artery with other forms of angina pectoris, Lung Nodule    Care Plan : Wellness (Adult)  Updates made by Lynne Logan, RN since 12/24/2020 12:00 AM     Problem: Medication Adherence (Wellness)   Priority: High     Goal: Medication Adherence Maintained   Start Date:  12/24/2020  Expected End Date: 04/13/2021  This Visit's Progress: On track  Priority: High  Note:   Current Barriers:  Ineffective Self Health Maintenance in a patient with Stage 3a chronic kidney disease, HTN, chronic systolic heart failure, Mild Cognitive Impairment, Atherosclerotic heart disease of native coronary artery with other forms of angina pectoris, Lung Nodule   Clinical Goal(s):  Collaboration with [name], FNP regarding development and update of comprehensive plan of care as evidenced by provider attestation and co-signature Inter-disciplinary care team collaboration (see longitudinal plan of care) patient will work with care management team to address care coordination and chronic disease management needs related to Disease Management Educational Needs Care Coordination Medication Management and Education Psychosocial Support Dementia and Caregiver Support   Interventions:  12/24/20 completed inbound call with son Sara Fox Evaluation of current treatment plan related to Stage 3a chronic kidney disease, HTN, chronic systolic heart failure, Mild Cognitive Impairment, Atherosclerotic heart disease of native coronary artery with other forms of angina pectoris, Lung Nodule  ,  self-management and patient's adherence to plan as established by provider. Collaboration with [name], FNP regarding development and update of comprehensive plan of care as evidenced by  provider attestation       and co-signature Inter-disciplinary care team collaboration (see longitudinal plan of care) Received inbound call from son Sara Fox stating he was expecting to receive his mother's medications on Wednesday this week, however he has not received any of her medications, he states her supply is getting very low Reviewed chart, noted patient's medications have been transferred to [facility] Provided son Sara Fox of the contact number for [facility], completed a joint call with [facility], spoke with Sara Fox Determined son Sara Fox is working this afternoon and will not be able to receive the medications this afternoon upon delivery  Determined Sara Fox will deliver the patient's medications tomorrow per okay by son Sara Fox, she will notify him today of any copays or issues that may arise, she will call him tomorrow to confirm the delivery time Discussed plans with patient for ongoing care management follow up and provided patient with direct contact information for care management team Self Care Activities:  With assistance from son Sara Fox, take all medications as prescribed Contact PCP and or embedded Pharm D for questions regarding medications or medication refills Ensure refills are on the way when medication supply is getting low Notify PCP if unable to take medications as prescribed  Patient Goals: - call [facility] for questions related to patient's future medication refills  Follow Up Plan: Telephone follow up appointment with care management team member scheduled for: 02/07/21     Plan:Telephone follow up appointment with care management team member scheduled for:  02/07/21  Barb Merino, RN, BSN, CCM Care Management Coordinator [facility]  Direct Phone: [phone]

## 2020-12-24 NOTE — Patient Instructions (Signed)
Goals Addressed      Medication Adherence Maintained   On track    Timeframe:  Long-Range Goal Priority:  High Start Date: 12/24/20                            Expected End Date: 04/13/21  Next scheduled follow up:  02/07/21         Self Care Activities:  With assistance from son [name], take all medications as prescribed Contact PCP and or embedded Pharm D for questions regarding medications or medication refills Ensure refills are on the way when medication supply is getting low Notify PCP if unable to take medications as prescribed  Patient Goals: - call [facility] for questions related to patient's future medication refills

## 2020-12-29 ENCOUNTER — Ambulatory Visit: Payer: Self-pay

## 2020-12-29 ENCOUNTER — Telehealth: Payer: Medicare Other

## 2020-12-29 DIAGNOSIS — IMO0001 Reserved for inherently not codable concepts without codable children: Secondary | ICD-10-CM

## 2020-12-29 DIAGNOSIS — G3184 Mild cognitive impairment, so stated: Secondary | ICD-10-CM

## 2020-12-29 DIAGNOSIS — I10 Essential (primary) hypertension: Secondary | ICD-10-CM

## 2020-12-29 DIAGNOSIS — R911 Solitary pulmonary nodule: Secondary | ICD-10-CM

## 2020-12-29 DIAGNOSIS — N1831 Chronic kidney disease, stage 3a: Secondary | ICD-10-CM

## 2020-12-29 DIAGNOSIS — I25118 Atherosclerotic heart disease of native coronary artery with other forms of angina pectoris: Secondary | ICD-10-CM

## 2020-12-31 ENCOUNTER — Ambulatory Visit: Payer: Medicare Other | Admitting: Physical Medicine & Rehabilitation

## 2021-01-03 ENCOUNTER — Telehealth: Payer: Medicare Other

## 2021-01-03 ENCOUNTER — Telehealth: Payer: Self-pay

## 2021-01-03 NOTE — Telephone Encounter (Signed)
  Care Management   Follow Up Note   01/03/2021 Name: Sara Fox MRN: [id number] DOB: [birth year]   Referred by: [name], FNP Reason for referral : Chronic Care Management (Unsuccessful call)   An unsuccessful telephone outreach was attempted today. The patient was referred to the case management team for assistance with care management and care coordination. HIPAA compliant voice message left requesting a return call.  Follow Up Plan: The care management team will reach out to the patient again over the next 21 days.   [name], BSW, CDP Social Worker, Certified Dementia Practitioner [place] [facility] [phone]

## 2021-01-05 NOTE — Chronic Care Management (AMB) (Signed)
Chronic Care Management   CCM RN Visit Note  12/29/2020 Name: Sara Fox MRN: [id number] DOB: Mar 23, 1941  Subjective: Sara Fox is a 80 y.o. year old female who is a primary care patient of [name], [place]. The care management team was consulted for assistance with disease management and care coordination needs.    Engaged with patient by telephone for follow up visit in response to provider referral for case management and/or care coordination services.   Consent to Services:  The patient was given information about Chronic Care Management services, agreed to services, and gave verbal consent prior to initiation of services.  Please see initial visit note for detailed documentation.   Patient agreed to services and verbal consent obtained.   Assessment: Review of patient past medical history, allergies, medications, health status, including review of consultants reports, laboratory and other test data, was performed as part of comprehensive evaluation and provision of chronic care management services.   SDOH (Social Determinants of Health) assessments and interventions performed:    CCM Care Plan  Allergies  Allergen Reactions   Buprenorphine Hcl Shortness Of Breath    Throat swelling/trouble breathing and lethargic   Morphine And Related Shortness Of Breath    Throat swelling/trouble breathing and lethargic   Celebrex [Celecoxib] Diarrhea and Swelling    Swelling of legs    Vioxx [Rofecoxib] Palpitations   Codeine Other (See Comments)    Bloated     Outpatient Encounter Medications as of 12/29/2020  Medication Sig   aspirin EC 81 MG tablet Take 81 mg by mouth daily as needed (headache).   budesonide-formoterol (SYMBICORT) 160-4.5 MCG/ACT inhaler TAKE 2 PUFFS BY MOUTH TWICE A DAY   Calcium Citrate-Vitamin D (CALCIUM CITRATE+D3 PETITES PO) Take 1 tablet by mouth daily.   cholecalciferol (VITAMIN D) 25 MCG (1000 UNIT) tablet Take 1,000 Units by mouth daily.    ciclopirox (PENLAC) 8 % solution Apply 1 application topically at bedtime. Apply over nail and surrounding skin. Apply daily over previous coat. After seven (7) days, file nail and continue cycle.   donepezil (ARICEPT) 10 MG tablet Take 1 tablet (10 mg total) by mouth at bedtime.   ferrous sulfate 325 (65 FE) MG tablet Take 325 mg by mouth daily with breakfast.   furosemide (LASIX) 20 MG tablet Take 1 tablet (20 mg total) by mouth every other day.   gabapentin (NEURONTIN) 100 MG capsule Take 1 capsule (100 mg total) by mouth 2 (two) times daily.   ibuprofen (ADVIL) 200 MG tablet Take 400 mg by mouth every 6 (six) hours as needed (pain).   Magnesium Oxide -Mg Supplement ([employer]S MAGNESIUM OXIDE) 250 MG TABS TAKE 1 TABLET BY MOUTH WITH EVENING MEAL DAILY   memantine (NAMENDA) 5 MG tablet Take 1 tablet (5 mg total) by mouth daily.   metoprolol tartrate (LOPRESSOR) 25 MG tablet Take 1 tablet (25 mg total) by mouth 2 (two) times daily.   Multiple Vitamin (MULTIVITAMIN WITH MINERALS) TABS tablet Take 1 tablet by mouth daily.   nitroGLYCERIN (NITROSTAT) 0.4 MG SL tablet PLACE ONE TABLET UNDER THE TONGUE EVERY 5 MINUTES AS NEEDED FOR CHEST PAIN.   oxybutynin (DITROPAN) 5 MG tablet Take 0.5 tablets (2.5 mg total) by mouth 2 (two) times daily.   pantoprazole (PROTONIX) 40 MG tablet Take 1 tablet (40 mg total) by mouth 2 (two) times daily.   polyethylene glycol (MIRALAX / GLYCOLAX) packet Take 17 g by mouth daily as needed for mild constipation.   Potassium Chloride  ER 20 MEQ TBCR Take 2 tablets by mouth every morning.   QUEtiapine (SEROQUEL) 50 MG tablet TAKE 1 TABLET BY MOUTH EVERYDAY AT BEDTIME   rosuvastatin (CRESTOR) 40 MG tablet Take 1 tablet (40 mg total) by mouth daily.   traMADol (ULTRAM) 50 MG tablet TAKE 1 TABLET 3 TIMES A DAY AND CAN TAKE 1 EXTRA 20 DAYS/MONTH   No facility-administered encounter medications on file as of 12/29/2020.    Patient Active Problem List   Diagnosis Date Noted   Fall  (on)(from) incline, initial encounter 11/23/2020   Macrocytosis without anemia 11/22/2020   Unwitnessed fall 11/22/2020   Late onset Alzheimer's dementia with behavioral disturbance ([place]) 11/18/2020   Visual hallucination 11/18/2020   Sundowning 11/18/2020   Non-small cell carcinoma of right lung, stage 1 ([place]) 06/08/2020   Pulmonary nodule 1 cm or greater in diameter 05/21/2020   Atherosclerotic heart disease of native coronary artery with other forms of angina pectoris ([place]) 04/22/2020   Memory loss 02/18/2019   Vitamin B12 deficiency 10/23/2018   Stage 3 chronic kidney disease ([place]) 06/03/2018   Chronic anemia 06/03/2018   Hypercholesterolemia 06/03/2018   Malnutrition of moderate degree 07/12/2017   Hypoalbuminemia 07/11/2017   Elevated LFTs 07/11/2017   Snoring 12/21/2016   Peripheral musculoskeletal gait disorder 02/05/2015   Lumbar post-laminectomy syndrome 02/05/2015   CAD in native artery 10/16/2014   NSTEMI (non-ST elevated myocardial infarction) ([place]) 09/13/2014   Cardiomyopathy, ischemic 58/85/0277   Chronic systolic heart failure ([place])    Tobacco abuse 09/12/2014   Dyslipidemia 09/12/2014   Normocytic anemia 09/12/2014   Chronic diastolic heart failure, NYHA class 1 ([place]) 09/12/2014   Postlaminectomy syndrome, cervical region 08/01/2013   Chronic midline low back pain with left-sided sciatica 08/01/2013   Shoulder joint contracture 08/01/2013   HTN (hypertension) 03/03/2013   Abnormal genetic test 03/03/2013    Conditions to be addressed/monitored: Stage 3a chronic kidney disease, HTN, chronic systolic heart failure, Mild Cognitive Impairment, Atherosclerotic heart disease of native coronary artery with other forms of angina pectoris, Lung Nodule    Care Plan : Wellness (Adult)  Updates made by Lynne Logan, RN since 12/29/2020 12:00 AM     Problem: Medication Adherence (Wellness)   Priority: High     Goal: Medication Adherence Maintained   Start Date:  12/24/2020  Expected End Date: 04/13/2021  Recent Progress: On track  Priority: High  Note:   Current Barriers:  Ineffective Self Health Maintenance in a patient with Stage 3a chronic kidney disease, HTN, chronic systolic heart failure, Mild Cognitive Impairment, Atherosclerotic heart disease of native coronary artery with other forms of angina pectoris, Lung Nodule   Clinical Goal(s):  Collaboration with [name], FNP regarding development and update of comprehensive plan of care as evidenced by provider attestation and co-signature Inter-disciplinary care team collaboration (see longitudinal plan of care) patient will work with care management team to address care coordination and chronic disease management needs related to Disease Management Educational Needs Care Coordination Medication Management and Education Psychosocial Support Dementia and Caregiver Support   Interventions:  12/24/20 completed inbound call with son [name] Evaluation of current treatment plan related to Stage 3a chronic kidney disease, HTN, chronic systolic heart failure, Mild Cognitive Impairment, Atherosclerotic heart disease of native coronary artery with other forms of angina pectoris, Lung Nodule  ,  self-management and patient's adherence to plan as established by provider. Collaboration with [name], FNP regarding development and update of comprehensive plan of care as evidenced by provider  attestation       and co-signature Inter-disciplinary care team collaboration (see longitudinal plan of care) Received inbound call from son [name] stating he was expecting to receive his mother's medications on Wednesday this week, however he has not received any of her medications, he states her supply is getting very low Reviewed chart, noted patient's medications have been transferred to [facility] Provided son [name] of the contact number for [facility], completed a joint call with [facility], spoke with Eustace Pen Determined son [name] is working this afternoon and will not be able to receive the medications this afternoon upon delivery  Determined Eustace Pen will deliver the patient's medications tomorrow per okay by son [name], she will notify him today of any copays or issues that may arise, she will call him tomorrow to confirm the delivery time Discussed plans with patient for ongoing care management follow up and provided patient with direct contact information for care management team 12/29/20 collaboration with [facility]  Received inbound call from son [name], voice message states he did not receive a call from the pharmacy on Saturday nor did he receive his mother's medications Spoke with [facility] for a status update concerning patients medications, spoke with Hailey who advised due to having some medication discrepancies this patient's medication delivery are on hold until the embedded pharmacy team speaks with son [name] the patient's son [name] left a voice message concerned his mother will run out of her medications  Collaborated with embedded pharm D Orlando Penner concerning son's concerns and request for help with getting her medications refilled/delivered Determined Coralyn Helling and her pharmacy team are communicating with son [name] for medication reconciliation in order to help get her medications refilled/delivered Self Care Activities:  With assistance from son [name], take all medications as prescribed Contact PCP and or embedded Pharm D for questions regarding medications or medication refills Ensure refills are on the way when medication supply is getting low Notify PCP if unable to take medications as prescribed  Patient Goals: - call [facility] for questions related to patient's future medication refills  Follow Up Plan: Telephone follow up appointment with care management team member scheduled for: 02/07/21     Plan:Telephone follow  up appointment with care management team member scheduled for:  02/07/21  Barb Merino, RN, BSN, CCM Care Management Coordinator [facility]  Direct Phone: [phone]

## 2021-01-05 NOTE — Patient Instructions (Signed)
Goals Addressed      Medication Adherence Maintained   On track    Timeframe:  Long-Range Goal Priority:  High Start Date: 12/24/20                            Expected End Date: 04/13/21  Next scheduled follow up:  02/07/21         Self Care Activities:  With assistance from son [name], take all medications as prescribed Contact PCP and or embedded Pharm D for questions regarding medications or medication refills Ensure refills are on the way when medication supply is getting low Notify PCP if unable to take medications as prescribed  Patient Goals: - call [facility] for questions related to patient's future medication refills

## 2021-01-06 ENCOUNTER — Ambulatory Visit: Payer: Self-pay

## 2021-01-06 ENCOUNTER — Ambulatory Visit (INDEPENDENT_AMBULATORY_CARE_PROVIDER_SITE_OTHER): Payer: Medicare Other

## 2021-01-06 VITALS — Ht 63.0 in | Wt 129.0 lb

## 2021-01-06 DIAGNOSIS — I10 Essential (primary) hypertension: Secondary | ICD-10-CM

## 2021-01-06 DIAGNOSIS — Z0000 Encounter for general adult medical examination without abnormal findings: Secondary | ICD-10-CM | POA: Diagnosis not present

## 2021-01-06 DIAGNOSIS — G3184 Mild cognitive impairment, so stated: Secondary | ICD-10-CM

## 2021-01-06 DIAGNOSIS — I5022 Chronic systolic (congestive) heart failure: Secondary | ICD-10-CM

## 2021-01-06 DIAGNOSIS — N1831 Chronic kidney disease, stage 3a: Secondary | ICD-10-CM

## 2021-01-06 NOTE — Patient Instructions (Signed)
Sara Fox , Thank you for taking time to come for your Medicare Wellness Visit. I appreciate your ongoing commitment to your health goals. Please review the following plan we discussed and let me know if I can assist you in the future.   Screening recommendations/referrals: Colonoscopy: not required Mammogram: not required Bone Density: completed 08/11/2017 Recommended yearly ophthalmology/optometry visit for glaucoma screening and checkup Recommended yearly dental visit for hygiene and checkup  Vaccinations: Influenza vaccine: decline Pneumococcal vaccine: decline Tdap vaccine: completed 08/30/2011, due 08/29/2021 Shingles vaccine: discussed   Covid-19: 09/25/2019, 08/30/2019  Advanced directives: copy in chart  Conditions/risks identified: smoking  Next appointment: Follow up in one year for your annual wellness visit    Preventive Care 54 Years and Older, Female Preventive care refers to lifestyle choices and visits with your health care provider that can promote health and wellness. What does preventive care include? A yearly physical exam. This is also called an annual well check. Dental exams once or twice a year. Routine eye exams. Ask your health care provider how often you should have your eyes checked. Personal lifestyle choices, including: Daily care of your teeth and gums. Regular physical activity. Eating a healthy diet. Avoiding tobacco and drug use. Limiting alcohol use. Practicing safe sex. Taking low-dose aspirin every day. Taking vitamin and mineral supplements as recommended by your health care provider. What happens during an annual well check? The services and screenings done by your health care provider during your annual well check will depend on your age, overall health, lifestyle risk factors, and family history of disease. Counseling  Your health care provider may ask you questions about your: Alcohol use. Tobacco use. Drug use. Emotional  well-being. Home and relationship well-being. Sexual activity. Eating habits. History of falls. Memory and ability to understand (cognition). Work and work Statistician. Reproductive health. Screening  You may have the following tests or measurements: Height, weight, and BMI. Blood pressure. Lipid and cholesterol levels. These may be checked every 5 years, or more frequently if you are over 91 years old. Skin check. Lung cancer screening. You may have this screening every year starting at age 8 if you have a 30-pack-year history of smoking and currently smoke or have quit within the past 15 years. Fecal occult blood test (FOBT) of the stool. You may have this test every year starting at age 10. Flexible sigmoidoscopy or colonoscopy. You may have a sigmoidoscopy every 5 years or a colonoscopy every 10 years starting at age 28. Hepatitis C blood test. Hepatitis B blood test. Sexually transmitted disease (STD) testing. Diabetes screening. This is done by checking your blood sugar (glucose) after you have not eaten for a while (fasting). You may have this done every 1-3 years. Bone density scan. This is done to screen for osteoporosis. You may have this done starting at age 27. Mammogram. This may be done every 1-2 years. Talk to your health care provider about how often you should have regular mammograms. Talk with your health care provider about your test results, treatment options, and if necessary, the need for more tests. Vaccines  Your health care provider may recommend certain vaccines, such as: Influenza vaccine. This is recommended every year. Tetanus, diphtheria, and acellular pertussis (Tdap, Td) vaccine. You may need a Td booster every 10 years. Zoster vaccine. You may need this after age 63. Pneumococcal 13-valent conjugate (PCV13) vaccine. One dose is recommended after age 32. Pneumococcal polysaccharide (PPSV23) vaccine. One dose is recommended after age 47. Talk to  your  health care provider about which screenings and vaccines you need and how often you need them. This information is not intended to replace advice given to you by your health care provider. Make sure you discuss any questions you have with your health care provider. Document Released: 05/28/2015 Document Revised: 01/19/2016 Document Reviewed: 03/02/2015 Elsevier Interactive Patient Education  2017 [place] Prevention in the Home Falls can cause injuries. They can happen to people of all ages. There are many things you can do to make your home safe and to help prevent falls. What can I do on the outside of my home? Regularly fix the edges of walkways and driveways and fix any cracks. Remove anything that might make you trip as you walk through a door, such as a raised step or threshold. Trim any bushes or trees on the path to your home. Use bright outdoor lighting. Clear any walking paths of anything that might make someone trip, such as rocks or tools. Regularly check to see if handrails are loose or broken. Make sure that both sides of any steps have handrails. Any raised decks and porches should have guardrails on the edges. Have any leaves, snow, or ice cleared regularly. Use sand or salt on walking paths during winter. Clean up any spills in your garage right away. This includes oil or grease spills. What can I do in the bathroom? Use night lights. Install grab bars by the toilet and in the tub and shower. Do not use towel bars as grab bars. Use non-skid mats or decals in the tub or shower. If you need to sit down in the shower, use a plastic, non-slip stool. Keep the floor dry. Clean up any water that spills on the floor as soon as it happens. Remove soap buildup in the tub or shower regularly. Attach bath mats securely with double-sided non-slip rug tape. Do not have throw rugs and other things on the floor that can make you trip. What can I do in the bedroom? Use night  lights. Make sure that you have a light by your bed that is easy to reach. Do not use any sheets or blankets that are too big for your bed. They should not hang down onto the floor. Have a firm chair that has side arms. You can use this for support while you get dressed. Do not have throw rugs and other things on the floor that can make you trip. What can I do in the kitchen? Clean up any spills right away. Avoid walking on wet floors. Keep items that you use a lot in easy-to-reach places. If you need to reach something above you, use a strong step stool that has a grab bar. Keep electrical cords out of the way. Do not use floor polish or wax that makes floors slippery. If you must use wax, use non-skid floor wax. Do not have throw rugs and other things on the floor that can make you trip. What can I do with my stairs? Do not leave any items on the stairs. Make sure that there are handrails on both sides of the stairs and use them. Fix handrails that are broken or loose. Make sure that handrails are as long as the stairways. Check any carpeting to make sure that it is firmly attached to the stairs. Fix any carpet that is loose or worn. Avoid having throw rugs at the top or bottom of the stairs. If you do have throw rugs, attach  them to the floor with carpet tape. Make sure that you have a light switch at the top of the stairs and the bottom of the stairs. If you do not have them, ask someone to add them for you. What else can I do to help prevent falls? Wear shoes that: Do not have high heels. Have rubber bottoms. Are comfortable and fit you well. Are closed at the toe. Do not wear sandals. If you use a stepladder: Make sure that it is fully opened. Do not climb a closed stepladder. Make sure that both sides of the stepladder are locked into place. Ask someone to hold it for you, if possible. Clearly mark and make sure that you can see: Any grab bars or handrails. First and last  steps. Where the edge of each step is. Use tools that help you move around (mobility aids) if they are needed. These include: Canes. Walkers. Scooters. Crutches. Turn on the lights when you go into a dark area. Replace any light bulbs as soon as they burn out. Set up your furniture so you have a clear path. Avoid moving your furniture around. If any of your floors are uneven, fix them. If there are any pets around you, be aware of where they are. Review your medicines with your doctor. Some medicines can make you feel dizzy. This can increase your chance of falling. Ask your doctor what other things that you can do to help prevent falls. This information is not intended to replace advice given to you by your health care provider. Make sure you discuss any questions you have with your health care provider. Document Released: 02/25/2009 Document Revised: 10/07/2015 Document Reviewed: 06/05/2014 Elsevier Interactive Patient Education  2017 [employer].

## 2021-01-06 NOTE — Chronic Care Management (AMB) (Addendum)
Chronic Care Management    Social Work Note  01/06/2021 Name: Sara Fox MRN: [id number] DOB: 1941/02/12  Sara Fox is a 80 y.o. year old female who is a primary care patient of [name], [place]. The CCM team was consulted to assist the patient with chronic disease management and/or care coordination needs related to: Level of Care Concerns and Caregiver Stress.   Collaboration with Glenna Durand LPN  for  case discussion  in response to provider referral for social work chronic care management and care coordination services.   Consent to Services:  The patient was given information about Chronic Care Management services, agreed to services, and gave verbal consent prior to initiation of services.  Please see initial visit note for detailed documentation.   Patient agreed to services and consent obtained.   Assessment: Review of patient past medical history, allergies, medications, and health status, including review of relevant consultants reports was performed today as part of a comprehensive evaluation and provision of chronic care management and care coordination services.     SDOH (Social Determinants of Health) assessments and interventions performed:    Advanced Directives Status: Not addressed in this encounter.  CCM Care Plan  Allergies  Allergen Reactions   Buprenorphine Hcl Shortness Of Breath    Throat swelling/trouble breathing and lethargic   Morphine And Related Shortness Of Breath    Throat swelling/trouble breathing and lethargic   Celebrex [Celecoxib] Diarrhea and Swelling    Swelling of legs    Vioxx [Rofecoxib] Palpitations   Codeine Other (See Comments)    Bloated     Outpatient Encounter Medications as of 01/06/2021  Medication Sig   aspirin EC 81 MG tablet Take 81 mg by mouth daily as needed (headache).   budesonide-formoterol (SYMBICORT) 160-4.5 MCG/ACT inhaler TAKE 2 PUFFS BY MOUTH TWICE A DAY   Calcium Citrate-Vitamin D (CALCIUM CITRATE+D3  PETITES PO) Take 1 tablet by mouth daily.   cholecalciferol (VITAMIN D) 25 MCG (1000 UNIT) tablet Take 1,000 Units by mouth daily.   ciclopirox (PENLAC) 8 % solution Apply 1 application topically at bedtime. Apply over nail and surrounding skin. Apply daily over previous coat. After seven (7) days, file nail and continue cycle.   donepezil (ARICEPT) 10 MG tablet Take 1 tablet (10 mg total) by mouth at bedtime.   ferrous sulfate 325 (65 FE) MG tablet Take 325 mg by mouth daily with breakfast.   furosemide (LASIX) 20 MG tablet Take 1 tablet (20 mg total) by mouth every other day.   gabapentin (NEURONTIN) 100 MG capsule Take 1 capsule (100 mg total) by mouth 2 (two) times daily.   ibuprofen (ADVIL) 200 MG tablet Take 400 mg by mouth every 6 (six) hours as needed (pain).   Magnesium Oxide -Mg Supplement ([employer] MAGNESIUM OXIDE) 250 MG TABS TAKE 1 TABLET BY MOUTH WITH EVENING MEAL DAILY   memantine (NAMENDA) 5 MG tablet Take 1 tablet (5 mg total) by mouth daily.   metoprolol tartrate (LOPRESSOR) 25 MG tablet Take 1 tablet (25 mg total) by mouth 2 (two) times daily.   Multiple Vitamin (MULTIVITAMIN WITH MINERALS) TABS tablet Take 1 tablet by mouth daily.   nitroGLYCERIN (NITROSTAT) 0.4 MG SL tablet PLACE ONE TABLET UNDER THE TONGUE EVERY 5 MINUTES AS NEEDED FOR CHEST PAIN.   oxybutynin (DITROPAN) 5 MG tablet Take 0.5 tablets (2.5 mg total) by mouth 2 (two) times daily.   pantoprazole (PROTONIX) 40 MG tablet Take 1 tablet (40 mg total) by mouth 2 (  two) times daily.   polyethylene glycol (MIRALAX / GLYCOLAX) packet Take 17 g by mouth daily as needed for mild constipation.   Potassium Chloride ER 20 MEQ TBCR Take 2 tablets by mouth every morning.   QUEtiapine (SEROQUEL) 50 MG tablet TAKE 1 TABLET BY MOUTH EVERYDAY AT BEDTIME   rosuvastatin (CRESTOR) 40 MG tablet Take 1 tablet (40 mg total) by mouth daily.   traMADol (ULTRAM) 50 MG tablet TAKE 1 TABLET 3 TIMES A DAY AND CAN TAKE 1 EXTRA 20 DAYS/MONTH   No  facility-administered encounter medications on file as of 01/06/2021.    Patient Active Problem List   Diagnosis Date Noted   Fall (on)(from) incline, initial encounter 11/23/2020   Macrocytosis without anemia 11/22/2020   Unwitnessed fall 11/22/2020   Late onset Alzheimer's dementia with behavioral disturbance ([place]) 11/18/2020   Visual hallucination 11/18/2020   Sundowning 11/18/2020   Non-small cell carcinoma of right lung, stage 1 ([place]) 06/08/2020   Pulmonary nodule 1 cm or greater in diameter 05/21/2020   Atherosclerotic heart disease of native coronary artery with other forms of angina pectoris ([place]) 04/22/2020   Memory loss 02/18/2019   Vitamin B12 deficiency 10/23/2018   Stage 3 chronic kidney disease ([place]) 06/03/2018   Chronic anemia 06/03/2018   Hypercholesterolemia 06/03/2018   Malnutrition of moderate degree 07/12/2017   Hypoalbuminemia 07/11/2017   Elevated LFTs 07/11/2017   Snoring 12/21/2016   Peripheral musculoskeletal gait disorder 02/05/2015   Lumbar post-laminectomy syndrome 02/05/2015   CAD in native artery 10/16/2014   NSTEMI (non-ST elevated myocardial infarction) ([place]) 09/13/2014   Cardiomyopathy, ischemic 40/98/1191   Chronic systolic heart failure ([employer])    Tobacco abuse 09/12/2014   Dyslipidemia 09/12/2014   Normocytic anemia 09/12/2014   Chronic diastolic heart failure, NYHA class 1 ([place]) 09/12/2014   Postlaminectomy syndrome, cervical region 08/01/2013   Chronic midline low back pain with left-sided sciatica 08/01/2013   Shoulder joint contracture 08/01/2013   HTN (hypertension) 03/03/2013   Abnormal genetic test 03/03/2013    Conditions to be addressed/monitored: CHF, HTN, CKD Stage III, and Dementia; Level of care concerns and Limited access to caregiver  Care Plan : Social Work [facility] Care Plan  Updates made by [name] since 01/06/2021 12:00 AM     Problem: Disease Progression      Goal: Disease Progression Managed   Start Date:  11/12/2020  Priority: High  Note:   Current Barriers:  Chronic disease management support and education needs related to CHF, HTN, CKD Stage III, and Mild Cognitive Impairment   Increased Agitation  Social Worker Clinical Goal(s):  patient will work with SW to identify and address any acute and/or chronic care coordination needs related to the self health management of CHF, HTN, CKD Stage III, and Mild Cognitive Impairment   Patient and her son will work with SW to identify long term care options Patient, with the help of her son, will visit the ED to evaluate cause of increased agitation and combativeness Goal not met  SW Interventions:  Inter-disciplinary care team collaboration (see longitudinal plan of care) Collaboration with [name], FNP regarding development and update of comprehensive plan of care as evidenced by provider attestation and co-signature Communication received from Glenna Durand, LPN indicating the patients son expressed the need for caregiver assistance and/or placement. It is indicated the patient will not enroll in [facility] due to the sons difficulty getting her ready each morning on his own Advised Mrs. Zenia Resides SW has an upcomming  scheduled call to continue discussion of caregiver support resources Collaboration with [place] [facility] of [facility] to request feedback on [facility] ability to offer in home assistance if the patient were to enroll. Based on FAQ review of what [facility] offers "home care services" is listed. SW requested clarification of what this entails Response received from Mrs. Bobbye [place] stating  "Thanks for reaching out to me. Yes, during the intake process, we assess the person in the home environment and our team determines whether or not he or she qualifies for a home health aide. If the person qualifies for this support, then upon enrollment in [facility] we work to staff an aide to provide the support based on need. Some receive care in the morning  and some in the evening." SW will follow up with the patients son over the next week to review above information and determine if the patient received an in home assessment  Patient Goals/Self-Care Activities patient will: with the help of her son  -  Engage with [facility] to become more knowledgeable of the program -Contact SW as needed prior to next scheduled call  Follow Up Plan:  SW will follow up with patient and her son over the next week       Follow Up Plan: SW will follow up with patient by phone over the next week      [name], BSW, CDP Social Worker, Certified Dementia Practitioner [place] [facility] [place] [facility] [phone]

## 2021-01-06 NOTE — Progress Notes (Signed)
I connected with [name] today by telephone and verified thatwe are  speaking about the correct person using two identifiers. Location patient: home Location provider: work Persons participating in the virtual visit: Gelsey Amyx, [name] (son), Glenna Durand LPN.   I discussed the limitations, risks, security and privacy concerns of performing an evaluation and management service by telephone and the availability of in person appointments. I also discussed with the patient that there may be a patient responsible charge related to this service. The patient expressed understanding and verbally consented to this telephonic visit.    Interactive audio and video telecommunications were attempted between this provider and patient, however failed, due to patient having technical difficulties OR patient did not have access to video capability.  We continued and completed visit with audio only.     Vital signs may be patient reported or missing.  Subjective:   NYAIRA HODGENS is a 80 y.o. female who presents for Medicare Annual (Subsequent) preventive examination.  Review of Systems     Cardiac Risk Factors include: advanced age (>70men, >92 women);dyslipidemia;hypertension;sedentary lifestyle;smoking/ tobacco exposure     Objective:    Today's Vitals   01/06/21 0958  Weight: 129 lb (58.5 kg)  Height: 5\' 3"  (1.6 m)   Body mass index is 22.85 kg/m.  Advanced Directives 01/06/2021 11/26/2020 09/20/2020 07/14/2020 05/25/2020 12/11/2019 12/05/2018  Does Patient Have a Medical Advance Directive? Yes No Yes Yes No Yes Yes  Type of Paramedic of [place];Living will - - Living will - [place];Living will [place];Living will  Does patient want to make changes to medical advance directive? - - No - Patient declined - - - No - Patient declined  Copy of [place] in Chart? Yes - validated most recent copy scanned in  chart (See row information) - - - - No - copy requested No - copy requested  Would patient like information on creating a medical advance directive? - No - Patient declined - - No - Patient declined - -    Current Medications (verified) Outpatient Encounter Medications as of 01/06/2021  Medication Sig   aspirin EC 81 MG tablet Take 81 mg by mouth daily as needed (headache).   budesonide-formoterol (SYMBICORT) 160-4.5 MCG/ACT inhaler TAKE 2 PUFFS BY MOUTH TWICE A DAY   Calcium Citrate-Vitamin D (CALCIUM CITRATE+D3 PETITES PO) Take 1 tablet by mouth daily.   cholecalciferol (VITAMIN D) 25 MCG (1000 UNIT) tablet Take 1,000 Units by mouth daily.   ciclopirox (PENLAC) 8 % solution Apply 1 application topically at bedtime. Apply over nail and surrounding skin. Apply daily over previous coat. After seven (7) days, file nail and continue cycle.   donepezil (ARICEPT) 10 MG tablet Take 1 tablet (10 mg total) by mouth at bedtime.   ferrous sulfate 325 (65 FE) MG tablet Take 325 mg by mouth daily with breakfast.   furosemide (LASIX) 20 MG tablet Take 1 tablet (20 mg total) by mouth every other day.   gabapentin (NEURONTIN) 100 MG capsule Take 1 capsule (100 mg total) by mouth 2 (two) times daily.   ibuprofen (ADVIL) 200 MG tablet Take 400 mg by mouth every 6 (six) hours as needed (pain).   Magnesium Oxide -Mg Supplement ([employer] MAGNESIUM OXIDE) 250 MG TABS TAKE 1 TABLET BY MOUTH WITH EVENING MEAL DAILY   memantine (NAMENDA) 5 MG tablet Take 1 tablet (5 mg total) by mouth daily.   metoprolol tartrate (LOPRESSOR) 25 MG  tablet Take 1 tablet (25 mg total) by mouth 2 (two) times daily.   Multiple Vitamin (MULTIVITAMIN WITH MINERALS) TABS tablet Take 1 tablet by mouth daily.   nitroGLYCERIN (NITROSTAT) 0.4 MG SL tablet PLACE ONE TABLET UNDER THE TONGUE EVERY 5 MINUTES AS NEEDED FOR CHEST PAIN.   oxybutynin (DITROPAN) 5 MG tablet Take 0.5 tablets (2.5 mg total) by mouth 2 (two) times daily.   pantoprazole (PROTONIX)  40 MG tablet Take 1 tablet (40 mg total) by mouth 2 (two) times daily.   polyethylene glycol (MIRALAX / GLYCOLAX) packet Take 17 g by mouth daily as needed for mild constipation.   Potassium Chloride ER 20 MEQ TBCR Take 2 tablets by mouth every morning.   QUEtiapine (SEROQUEL) 50 MG tablet TAKE 1 TABLET BY MOUTH EVERYDAY AT BEDTIME   rosuvastatin (CRESTOR) 40 MG tablet Take 1 tablet (40 mg total) by mouth daily.   traMADol (ULTRAM) 50 MG tablet TAKE 1 TABLET 3 TIMES A DAY AND CAN TAKE 1 EXTRA 20 DAYS/MONTH   No facility-administered encounter medications on file as of 01/06/2021.    Allergies (verified) Buprenorphine hcl, Morphine and related, Celebrex [celecoxib], Vioxx [rofecoxib], and Codeine   History: Past Medical History:  Diagnosis Date   Acute kidney injury ([place])    Back pain    Cervical cancer (HCC)    CHF (congestive heart failure) (HCC)    Closed fracture of left distal radius 05/28/2017   COPD (chronic obstructive pulmonary disease) (HCC)    Coronary artery disease    s/p DES x2 to RCA 2016   GERD (gastroesophageal reflux disease)    History of radiation therapy 07/27/20-08/16/20   Right lung- SBRT- Dr. Gery Pray    Hyperlipidemia    Hypertension    Lower leg edema 12/21/2016   Mild cognitive impairment    Myocardial infarction [facility])    Neck pain    Rhabdomyolysis    Stomach problems    Past Surgical History:  Procedure Laterality Date   ABDOMINAL HYSTERECTOMY     BACK SURGERY  2012   lower back   BRONCHIAL BIOPSY  05/25/2020   Procedure: BRONCHIAL BIOPSIES;  Surgeon: Collene Gobble, MD;  Location: [facility] ENDOSCOPY;  Service: Pulmonary;;   BRONCHIAL BRUSHINGS  05/25/2020   Procedure: BRONCHIAL BRUSHINGS;  Surgeon: Collene Gobble, MD;  Location: [facility] ENDOSCOPY;  Service: Pulmonary;;   BRONCHIAL NEEDLE ASPIRATION BIOPSY  05/25/2020   Procedure: BRONCHIAL NEEDLE ASPIRATION BIOPSIES;  Surgeon: Collene Gobble, MD;  Location: [place] ENDOSCOPY;  Service: Pulmonary;;   CARDIAC  CATHETERIZATION  06/1999   noncritical disease invovling PDA   CARDIAC CATHETERIZATION N/A 09/14/2014   Procedure: Left Heart Cath and Coronary Angiography;  Surgeon: Leonie Man, MD;  Location: [place] INVASIVE CV LAB CUPID;  Service: Cardiovascular;  Laterality: N/A;   CHOLECYSTECTOMY     ESOPHAGOGASTRODUODENOSCOPY (EGD) WITH PROPOFOL Left 07/19/2017   Procedure: ESOPHAGOGASTRODUODENOSCOPY (EGD) WITH PROPOFOL;  Surgeon: Ronnette Juniper, MD;  Location: [facility] ENDOSCOPY;  Service: Gastroenterology;  Laterality: Left;   FINE NEEDLE ASPIRATION  05/25/2020   Procedure: FINE NEEDLE ASPIRATION (FNA) LINEAR;  Surgeon: Collene Gobble, MD;  Location: [facility] ENDOSCOPY;  Service: Pulmonary;;   FRACTURE SURGERY Left 05/2017   left wrist   HARDWARE REMOVAL Left 11/07/2017   Procedure: LEFT WRIST HARDWARE REMOVAL;  Surgeon: Charlotte Crumb, MD;  Location: [place];  Service: Orthopedics;  Laterality: Left;   KNEE SURGERY Bilateral 2001 & 2007   NECK SURGERY  2012   2012  PARTIAL GASTRECTOMY  2005   subtotal   PERCUTANEOUS CORONARY STENT INTERVENTION (PCI-S)  09/14/2014   Procedure: Percutaneous Coronary Stent Intervention (Pci-S);  Surgeon: Leonie Man, MD;  Location: [facility] INVASIVE CV LAB CUPID;  Service: Cardiovascular;;   SHOULDER SURGERY Right    TRANSTHORACIC ECHOCARDIOGRAM  06/02/2010   EF=>55%, normal LV systolic function; normal RV systolic function; mild mitral annular calcif; trace TR; AV mildly sclerotic   VIDEO BRONCHOSCOPY WITH ENDOBRONCHIAL NAVIGATION N/A 05/25/2020   Procedure: VIDEO BRONCHOSCOPY WITH ENDOBRONCHIAL NAVIGATION;  Surgeon: Collene Gobble, MD;  Location: [place] ENDOSCOPY;  Service: Pulmonary;  Laterality: N/A;   VIDEO BRONCHOSCOPY WITH ENDOBRONCHIAL ULTRASOUND N/A 05/25/2020   Procedure: VIDEO BRONCHOSCOPY WITH ENDOBRONCHIAL ULTRASOUND;  Surgeon: Collene Gobble, MD;  Location: [place] ENDOSCOPY;  Service: Pulmonary;  Laterality: N/A;   WRIST OSTEOTOMY Left 11/07/2017   Procedure: LEFT WRIST DISTAL ULNA  RESECTION WITH EXTENSOR CARPI ULNARIS STABILIZATION;  Surgeon: Charlotte Crumb, MD;  Location: [place];  Service: Orthopedics;  Laterality: Left;   Family History  Problem Relation Age of Onset   Heart disease Mother        also HTN   Diabetes Mother    Colon cancer Mother    Heart disease Brother        deceased at 26   Hypertension Brother    Heart attack Brother    Heart disease Brother    Hypertension Brother    Social History   Socioeconomic History   Marital status: Married    Spouse name: Not on file   Number of children: 2   Years of education: GED   Highest education level: Not on file  Occupational History    Employer: RETIRED  Tobacco Use   Smoking status: Every Day    Packs/day: 0.50    Years: 60.00    Pack years: 30.00    Types: Cigarettes   Smokeless tobacco: Never   Tobacco comments:    currently smokes 1/2 ppd or less  Vaping Use   Vaping Use: Former  Substance and Sexual Activity   Alcohol use: No   Drug use: No   Sexual activity: Not Currently  Other Topics Concern   Not on file  Social History Narrative   Coffee/soda daily    Lives w/ son, [name]   Right handed   Social Determinants of Health   Financial Resource Strain: Low Risk    Difficulty of Paying Living Expenses: Not hard at all  Food Insecurity: No Food Insecurity   Worried About Charity fundraiser in the Last Year: Never true   Ran Out of Food in the Last Year: Never true  Transportation Needs: No Transportation Needs   Lack of Transportation (Medical): No   Lack of Transportation (Non-Medical): No  Physical Activity: Inactive   Days of Exercise per Week: 0 days   Minutes of Exercise per Session: 0 min  Stress: Stress Concern Present   Feeling of Stress : Rather much  Social Connections: Not on file    Tobacco Counseling Ready to quit: Not Answered Counseling given: Not Answered Tobacco comments: currently smokes 1/2 ppd or less   Clinical Intake:  Pre-visit  preparation completed: Yes  Pain : No/denies pain     Nutritional Status: BMI of 19-24  Normal Nutritional Risks: None Diabetes: No  How often do you need to have someone help you when you read instructions, pamphlets, or other written materials from your doctor or pharmacy?: 3 - Sometimes  Diabetic?  no  Interpreter Needed?: No  Information entered by :: NAllen LPN   Activities of Daily Living In your present state of health, do you have any difficulty performing the following activities: 01/06/2021 12/15/2020  Hearing? N N  Vision? N N  Difficulty concentrating or making decisions? Tempie Donning  Walking or climbing stairs? N Y  Dressing or bathing? Y Y  Doing errands, shopping? Tempie Donning  Preparing Food and eating ? Y -  Using the Toilet? N -  In the past six months, have you accidently leaked urine? Y -  Comment wears depends -  Do you have problems with loss of bowel control? N -  Managing your Medications? Y -  Managing your Finances? Y -  Housekeeping or managing your Housekeeping? Y -  Some recent data might be hidden    Patient Care Team: [name], FNP as PCP - General (General Practice) Debara Pickett Nadean Corwin, MD as PCP - Cardiology (Cardiology) [name] as Social Worker [place], Claudette Stapler, RN as Case Manager Valrie Hart, RN as Oncology Nurse Navigator (Oncology)  Indicate any recent Medical Services you may have received from other than [facility] providers in the past year (date may be approximate).     Assessment:   This is a routine wellness examination for Yenni.  Hearing/Vision screen Vision Screening - Comments:: No regular eye exams,  Dietary issues and exercise activities discussed: Current Exercise Habits: The patient does not participate in regular exercise at present   Goals Addressed             This Visit's Progress    Patient Stated       01/06/2021, no goals       Depression Screen PHQ 2/9 Scores 01/06/2021 07/05/2020 01/01/2020 12/11/2019  07/10/2019 06/30/2019 06/11/2019  PHQ - 2 Score 0 0 0 0 0 0 0  PHQ- 9 Score - - - 0 - - -    Fall Risk Fall Risk  01/06/2021 07/05/2020 01/01/2020 12/11/2019 07/10/2019  Falls in the past year? 1 0 1 1 1   Comment losing balance, tripped over dog - - tripped over dog -  Number falls in past yr: 1 0 0 0 1  Injury with Fall? 0 0 0 0 0  Risk Factor Category  - - - - -  Risk for fall due to : History of fall(s);Medication side effect - History of fall(s);Impaired balance/gait Impaired balance/gait;Medication side effect -  Risk for fall due to: Comment - - - - -  Follow up Falls evaluation completed;Education provided;Falls prevention discussed - - Falls evaluation completed;Education provided;Falls prevention discussed -  Comment - - - - -    FALL RISK PREVENTION PERTAINING TO THE HOME:  Any stairs in or around the home? No  If so, are there any without handrails? N/a Home free of loose throw rugs in walkways, pet beds, electrical cords, etc? Yes  Adequate lighting in your home to reduce risk of falls? Yes   ASSISTIVE DEVICES UTILIZED TO PREVENT FALLS:  Life alert? No  Use of a cane, walker or w/c? Yes  Grab bars in the bathroom? No  Shower chair or bench in shower? Yes  Elevated toilet seat or a handicapped toilet? No   TIMED UP AND GO:  Was the test performed? No .      Cognitive Function:   Montreal Cognitive Assessment  11/18/2020  Visuospatial/ Executive (0/5) 1  Naming (0/3) 2  Attention: Read list of digits (0/2) 1  Attention: Read list of letters (0/1) 0  Attention: Serial 7 subtraction starting at 100 (0/3) 0  Language: Repeat phrase (0/2) 1  Language : Fluency (0/1) 0  Abstraction (0/2) 1  Delayed Recall (0/5) 0  Orientation (0/6) 1  Total 7  Adjusted Score (based on education) 8   6CIT Screen 12/11/2019 12/05/2018  What Year? 0 points 0 points  What month? 0 points 0 points  What time? 0 points 0 points  Count back from 20 0 points 0 points  Months in reverse 0  points 4 points  Repeat phrase 6 points 0 points  Total Score 6 4    Immunizations Immunization History  Administered Date(s) Administered   PFIZER(Purple Top)SARS-COV-2 Vaccination 08/30/2019, 09/25/2019    TDAP status: Up to date  Flu Vaccine status: Declined, Education has been provided regarding the importance of this vaccine but patient still declined. Advised may receive this vaccine at local pharmacy or Health Dept. Aware to provide a copy of the vaccination record if obtained from local pharmacy or Health Dept. Verbalized acceptance and understanding.  Pneumococcal vaccine status: Declined,  Education has been provided regarding the importance of this vaccine but patient still declined. Advised may receive this vaccine at local pharmacy or Health Dept. Aware to provide a copy of the vaccination record if obtained from local pharmacy or Health Dept. Verbalized acceptance and understanding.   Covid-19 vaccine status: Completed vaccines  Qualifies for Shingles Vaccine? Yes   Zostavax completed No   Shingrix Completed?: No.    Education has been provided regarding the importance of this vaccine. Patient has been advised to call insurance company to determine out of pocket expense if they have not yet received this vaccine. Advised may also receive vaccine at local pharmacy or Health Dept. Verbalized acceptance and understanding.  Screening Tests Health Maintenance  Topic Date Due   Zoster Vaccines- Shingrix (1 of 2) Never done   PNA vac Low Risk Adult (2 of 2 - PPSV23) 08/29/2012   COVID-19 Vaccine (3 - Pfizer risk series) 10/23/2019   INFLUENZA VACCINE  12/13/2020   TETANUS/TDAP  08/29/2021   DEXA SCAN  Completed   HPV VACCINES  Aged Out    Health Maintenance  Health Maintenance Due  Topic Date Due   Zoster Vaccines- Shingrix (1 of 2) Never done   PNA vac Low Risk Adult (2 of 2 - PPSV23) 08/29/2012   COVID-19 Vaccine (3 - Pfizer risk series) 10/23/2019   INFLUENZA  VACCINE  12/13/2020    Colorectal cancer screening: No longer required.   Mammogram status: No longer required due to age.  Bone Density status: Completed 08/11/2017.   Lung Cancer Screening: (Low Dose CT Chest recommended if Age 34-80 years, 30 pack-year currently smoking OR have quit w/in 15years.) does not qualify.   Lung Cancer Screening Referral: no  Additional Screening:  Hepatitis C Screening: does not qualify;  Vision Screening: Recommended annual ophthalmology exams for early detection of glaucoma and other disorders of the eye. Is the patient up to date with their annual eye exam?  No  Who is the provider or what is the name of the office in which the patient attends annual eye exams? none If pt is not established with a provider, would they like to be referred to a provider to establish care? No .   Dental Screening: Recommended annual dental exams for proper oral hygiene  Community Resource Referral / Chronic Care Management: CRR required this visit?  No  CCM required this visit?  No      Plan:     I have personally reviewed and noted the following in the patient's chart:   Medical and social history Use of alcohol, tobacco or illicit drugs  Current medications and supplements including opioid prescriptions.  Functional ability and status Nutritional status Physical activity Advanced directives List of other physicians Hospitalizations, surgeries, and ER visits in previous 12 months Vitals Screenings to include cognitive, depression, and falls Referrals and appointments  In addition, I have reviewed and discussed with patient certain preventive protocols, quality metrics, and best practice recommendations. A written personalized care plan for preventive services as well as general preventive health recommendations were provided to patient.     Kellie Simmering, LPN   [id number]/[id number]   Nurse Notes:  All questions were answered by her son. 6 CIT not  administered. Patient has diagnosis of dementia.

## 2021-01-07 ENCOUNTER — Ambulatory Visit: Payer: Medicare Other

## 2021-01-07 DIAGNOSIS — N1831 Chronic kidney disease, stage 3a: Secondary | ICD-10-CM

## 2021-01-07 DIAGNOSIS — G3184 Mild cognitive impairment, so stated: Secondary | ICD-10-CM

## 2021-01-07 DIAGNOSIS — I5022 Chronic systolic (congestive) heart failure: Secondary | ICD-10-CM

## 2021-01-07 DIAGNOSIS — I10 Essential (primary) hypertension: Secondary | ICD-10-CM

## 2021-01-07 NOTE — Chronic Care Management (AMB) (Signed)
Chronic Care Management    Social Work Note  01/07/2021 Name: Sara Fox MRN: [id number] DOB: [birth year]  Sara Fox is a 80 y.o. year old female who is a primary care patient of [name], [place]. The CCM team was consulted to assist the patient with chronic disease management and/or care coordination needs related to: Level of Care Concerns and Caregiver Stress.   Collaboration with Suanne Marker with [facility]  for  case discussion  in response to provider referral for social work chronic care management and care coordination services.   Consent to Services:  The patient was given information about Chronic Care Management services, agreed to services, and gave verbal consent prior to initiation of services.  Please see initial visit note for detailed documentation.   Patient agreed to services and consent obtained.   Assessment: Review of patient past medical history, allergies, medications, and health status, including review of relevant consultants reports was performed today as part of a comprehensive evaluation and provision of chronic care management and care coordination services.     SDOH (Social Determinants of Health) assessments and interventions performed:    Advanced Directives Status: Not addressed in this encounter.  CCM Care Plan  Allergies  Allergen Reactions   Buprenorphine Hcl Shortness Of Breath    Throat swelling/trouble breathing and lethargic   Morphine And Related Shortness Of Breath    Throat swelling/trouble breathing and lethargic   Celebrex [Celecoxib] Diarrhea and Swelling    Swelling of legs    Vioxx [Rofecoxib] Palpitations   Codeine Other (See Comments)    Bloated     Outpatient Encounter Medications as of 01/07/2021  Medication Sig   aspirin EC 81 MG tablet Take 81 mg by mouth daily as needed (headache).   budesonide-formoterol (SYMBICORT) 160-4.5 MCG/ACT inhaler TAKE 2 PUFFS BY MOUTH TWICE A DAY   Calcium Citrate-Vitamin D (CALCIUM  CITRATE+D3 PETITES PO) Take 1 tablet by mouth daily.   cholecalciferol (VITAMIN D) 25 MCG (1000 UNIT) tablet Take 1,000 Units by mouth daily.   ciclopirox (PENLAC) 8 % solution Apply 1 application topically at bedtime. Apply over nail and surrounding skin. Apply daily over previous coat. After seven (7) days, file nail and continue cycle.   donepezil (ARICEPT) 10 MG tablet Take 1 tablet (10 mg total) by mouth at bedtime.   ferrous sulfate 325 (65 FE) MG tablet Take 325 mg by mouth daily with breakfast.   furosemide (LASIX) 20 MG tablet Take 1 tablet (20 mg total) by mouth every other day.   gabapentin (NEURONTIN) 100 MG capsule Take 1 capsule (100 mg total) by mouth 2 (two) times daily.   ibuprofen (ADVIL) 200 MG tablet Take 400 mg by mouth every 6 (six) hours as needed (pain).   Magnesium Oxide -Mg Supplement ([employer]S MAGNESIUM OXIDE) 250 MG TABS TAKE 1 TABLET BY MOUTH WITH EVENING MEAL DAILY   memantine (NAMENDA) 5 MG tablet Take 1 tablet (5 mg total) by mouth daily.   metoprolol tartrate (LOPRESSOR) 25 MG tablet Take 1 tablet (25 mg total) by mouth 2 (two) times daily.   Multiple Vitamin (MULTIVITAMIN WITH MINERALS) TABS tablet Take 1 tablet by mouth daily.   nitroGLYCERIN (NITROSTAT) 0.4 MG SL tablet PLACE ONE TABLET UNDER THE TONGUE EVERY 5 MINUTES AS NEEDED FOR CHEST PAIN.   oxybutynin (DITROPAN) 5 MG tablet Take 0.5 tablets (2.5 mg total) by mouth 2 (two) times daily.   pantoprazole (PROTONIX) 40 MG tablet Take 1 tablet (40 mg total)  by mouth 2 (two) times daily.   polyethylene glycol (MIRALAX / GLYCOLAX) packet Take 17 g by mouth daily as needed for mild constipation.   Potassium Chloride ER 20 MEQ TBCR Take 2 tablets by mouth every morning.   QUEtiapine (SEROQUEL) 50 MG tablet TAKE 1 TABLET BY MOUTH EVERYDAY AT BEDTIME   rosuvastatin (CRESTOR) 40 MG tablet Take 1 tablet (40 mg total) by mouth daily.   traMADol (ULTRAM) 50 MG tablet TAKE 1 TABLET 3 TIMES A DAY AND CAN TAKE 1 EXTRA 20  DAYS/MONTH   No facility-administered encounter medications on file as of 01/07/2021.    Patient Active Problem List   Diagnosis Date Noted   Fall (on)(from) incline, initial encounter 11/23/2020   Macrocytosis without anemia 11/22/2020   Unwitnessed fall 11/22/2020   Late onset Alzheimer's dementia with behavioral disturbance ([place]) 11/18/2020   Visual hallucination 11/18/2020   Sundowning 11/18/2020   Non-small cell carcinoma of right lung, stage 1 ([place]) 06/08/2020   Pulmonary nodule 1 cm or greater in diameter 05/21/2020   Atherosclerotic heart disease of native coronary artery with other forms of angina pectoris ([place]) 04/22/2020   Memory loss 02/18/2019   Vitamin B12 deficiency 10/23/2018   Stage 3 chronic kidney disease ([place]) 06/03/2018   Chronic anemia 06/03/2018   Hypercholesterolemia 06/03/2018   Malnutrition of moderate degree 07/12/2017   Hypoalbuminemia 07/11/2017   Elevated LFTs 07/11/2017   Snoring 12/21/2016   Peripheral musculoskeletal gait disorder 02/05/2015   Lumbar post-laminectomy syndrome 02/05/2015   CAD in native artery 10/16/2014   NSTEMI (non-ST elevated myocardial infarction) ([place]) 09/13/2014   Cardiomyopathy, ischemic 84/66/5993   Chronic systolic heart failure ([place])    Tobacco abuse 09/12/2014   Dyslipidemia 09/12/2014   Normocytic anemia 09/12/2014   Chronic diastolic heart failure, NYHA class 1 ([place]) 09/12/2014   Postlaminectomy syndrome, cervical region 08/01/2013   Chronic midline low back pain with left-sided sciatica 08/01/2013   Shoulder joint contracture 08/01/2013   HTN (hypertension) 03/03/2013   Abnormal genetic test 03/03/2013    Conditions to be addressed/monitored: CHF, HTN, CKD Stage III, and Dementia; Limited access to caregiver and Memory Deficits  Care Plan : Social Work [facility] Care Plan  Updates made by [name] since 01/07/2021 12:00 AM     Problem: Disease Progression      Goal: Disease Progression Managed   Start  Date: 11/12/2020  Priority: High  Note:   Current Barriers:  Chronic disease management support and education needs related to CHF, HTN, CKD Stage III, and Mild Cognitive Impairment   Increased Agitation  Social Worker Clinical Goal(s):  patient will work with SW to identify and address any acute and/or chronic care coordination needs related to the self health management of CHF, HTN, CKD Stage III, and Mild Cognitive Impairment   Patient and her son will work with SW to identify long term care options Patient, with the help of her son, will visit the ED to evaluate cause of increased agitation and combativeness Goal not met  SW Interventions:  Inter-disciplinary care team collaboration (see longitudinal plan of care) Collaboration with [name], FNP regarding development and update of comprehensive plan of care as evidenced by provider attestation and co-signature Successful outbound call placed to [facility] with [facility] in response to a voice message received Discussed Suanne Marker has been waiting to hear back from the patients son to schedule a tour of [facility] Determined they have not yet completed the assessment with the patient and therefore would  not have told [name] the patient could not have in home assistance Reviewed that [facility] will offer a caregiver to patients who need one to perform ADL's Discussed patient care needs and ongoing barriers due to limited follow through Suanne Marker reports she will attempt to contact [name] today  SW will follow up with the patient as scheduled next week Collaboration with [place] to advise of intervention and plan   Patient Goals/Self-Care Activities patient will: with the help of her son  -  Engage with [facility] to become more knowledgeable of the program -Contact SW as needed prior to next scheduled call  Follow Up Plan:  SW will follow up with patient and her son over the next week       Follow Up Plan:  SW will follow up with patient by phone over the next week      [name], BSW, CDP Social Worker, Certified Dementia Practitioner [place] [facility] [place] [facility] [phone]

## 2021-01-10 ENCOUNTER — Other Ambulatory Visit: Payer: Self-pay | Admitting: Nurse Practitioner

## 2021-01-10 DIAGNOSIS — G4700 Insomnia, unspecified: Secondary | ICD-10-CM

## 2021-01-10 DIAGNOSIS — G3184 Mild cognitive impairment, so stated: Secondary | ICD-10-CM

## 2021-01-12 ENCOUNTER — Telehealth: Payer: Self-pay

## 2021-01-12 ENCOUNTER — Telehealth: Payer: Medicare Other

## 2021-01-12 DIAGNOSIS — I25118 Atherosclerotic heart disease of native coronary artery with other forms of angina pectoris: Secondary | ICD-10-CM | POA: Diagnosis not present

## 2021-01-12 DIAGNOSIS — G301 Alzheimer's disease with late onset: Secondary | ICD-10-CM | POA: Diagnosis not present

## 2021-01-12 DIAGNOSIS — I10 Essential (primary) hypertension: Secondary | ICD-10-CM | POA: Diagnosis not present

## 2021-01-12 DIAGNOSIS — I5022 Chronic systolic (congestive) heart failure: Secondary | ICD-10-CM

## 2021-01-12 DIAGNOSIS — N1831 Chronic kidney disease, stage 3a: Secondary | ICD-10-CM | POA: Diagnosis not present

## 2021-01-12 DIAGNOSIS — F0281 Dementia in other diseases classified elsewhere with behavioral disturbance: Secondary | ICD-10-CM

## 2021-01-12 NOTE — Telephone Encounter (Signed)
  Care Management   Follow Up Note   01/12/2021 Name: Sara Fox MRN: [id number] DOB: 1940/09/06   Referred by: [name], FNP Reason for referral : Chronic Care Management (Unsuccessful call)   An unsuccessful telephone outreach was attempted today. The patient was referred to the case management team for assistance with care management and care coordination.  SW left a HIPAA compliant voice message for the patients son Sara Fox. SW requested [name] to return call.  Follow Up Plan: The care management team will reach out to the patient again over the next 14 days.   [name], BSW, CDP Social Worker, Certified Dementia Practitioner [place] [facility] [place] [facility] [phone]

## 2021-01-19 ENCOUNTER — Telehealth: Payer: Medicare Other

## 2021-01-19 ENCOUNTER — Ambulatory Visit (INDEPENDENT_AMBULATORY_CARE_PROVIDER_SITE_OTHER): Payer: Medicare Other

## 2021-01-19 DIAGNOSIS — N1831 Chronic kidney disease, stage 3a: Secondary | ICD-10-CM

## 2021-01-19 DIAGNOSIS — I10 Essential (primary) hypertension: Secondary | ICD-10-CM

## 2021-01-19 DIAGNOSIS — I25118 Atherosclerotic heart disease of native coronary artery with other forms of angina pectoris: Secondary | ICD-10-CM

## 2021-01-19 DIAGNOSIS — G3184 Mild cognitive impairment, so stated: Secondary | ICD-10-CM

## 2021-01-19 DIAGNOSIS — C3491 Malignant neoplasm of unspecified part of right bronchus or lung: Secondary | ICD-10-CM

## 2021-01-19 NOTE — Patient Instructions (Signed)
Goals Addressed      Medication Adherence Maintained   On track    Timeframe:  Long-Range Goal Priority:  High Start Date: 12/24/20                            Expected End Date: 04/13/21  Next scheduled follow up:  02/07/21         Self Care Activities:  With assistance from son [name], take all medications as prescribed Contact PCP and or embedded Pharm D for questions regarding medications or medication refills Ensure refills are on the way when medication supply is getting low Notify PCP if unable to take medications as prescribed  Patient Goals: - call [facility] for questions related to patient's future medication refills

## 2021-01-19 NOTE — Chronic Care Management (AMB) (Signed)
Chronic Care Management   CCM RN Visit Note  01/19/2021 Name: Sara Fox MRN: [id number] DOB: 07-Feb-1941  Subjective: Sara Fox is a 80 y.o. year old female who is a primary care patient of [name], [place]. The care management team was consulted for assistance with disease management and care coordination needs.    Engaged with patient by telephone for follow up visit in response to provider referral for case management and/or care coordination services.   Consent to Services:  The patient was given information about Chronic Care Management services, agreed to services, and gave verbal consent prior to initiation of services.  Please see initial visit note for detailed documentation.   Patient agreed to services and verbal consent obtained.   Assessment: Review of patient past medical history, allergies, medications, health status, including review of consultants reports, laboratory and other test data, was performed as part of comprehensive evaluation and provision of chronic care management services.   SDOH (Social Determinants of Health) assessments and interventions performed:    CCM Care Plan  Allergies  Allergen Reactions   Buprenorphine Hcl Shortness Of Breath    Throat swelling/trouble breathing and lethargic   Morphine And Related Shortness Of Breath    Throat swelling/trouble breathing and lethargic   Celebrex [Celecoxib] Diarrhea and Swelling    Swelling of legs    Vioxx [Rofecoxib] Palpitations   Codeine Other (See Comments)    Bloated     Outpatient Encounter Medications as of 01/19/2021  Medication Sig   aspirin EC 81 MG tablet Take 81 mg by mouth daily as needed (headache).   budesonide-formoterol (SYMBICORT) 160-4.5 MCG/ACT inhaler TAKE 2 PUFFS BY MOUTH TWICE A DAY   Calcium Citrate-Vitamin D (CALCIUM CITRATE+D3 PETITES PO) Take 1 tablet by mouth daily.   cholecalciferol (VITAMIN D) 25 MCG (1000 UNIT) tablet Take 1,000 Units by mouth daily.    ciclopirox (PENLAC) 8 % solution Apply 1 application topically at bedtime. Apply over nail and surrounding skin. Apply daily over previous coat. After seven (7) days, file nail and continue cycle.   CVS MAGNESIUM OXIDE 250 MG TABS TAKE 1 TABLET BY MOUTH WITH EVENING MEAL DAILY   donepezil (ARICEPT) 10 MG tablet Take 1 tablet (10 mg total) by mouth at bedtime.   ferrous sulfate 325 (65 FE) MG tablet Take 325 mg by mouth daily with breakfast.   furosemide (LASIX) 20 MG tablet Take 1 tablet (20 mg total) by mouth every other day.   gabapentin (NEURONTIN) 100 MG capsule Take 1 capsule (100 mg total) by mouth 2 (two) times daily.   ibuprofen (ADVIL) 200 MG tablet Take 400 mg by mouth every 6 (six) hours as needed (pain).   memantine (NAMENDA) 5 MG tablet Take 1 tablet (5 mg total) by mouth daily.   metoprolol tartrate (LOPRESSOR) 25 MG tablet Take 1 tablet (25 mg total) by mouth 2 (two) times daily.   Multiple Vitamin (MULTIVITAMIN WITH MINERALS) TABS tablet Take 1 tablet by mouth daily.   nitroGLYCERIN (NITROSTAT) 0.4 MG SL tablet PLACE ONE TABLET UNDER THE TONGUE EVERY 5 MINUTES AS NEEDED FOR CHEST PAIN.   oxybutynin (DITROPAN) 5 MG tablet Take 0.5 tablets (2.5 mg total) by mouth 2 (two) times daily.   pantoprazole (PROTONIX) 40 MG tablet Take 1 tablet (40 mg total) by mouth 2 (two) times daily.   polyethylene glycol (MIRALAX / GLYCOLAX) packet Take 17 g by mouth daily as needed for mild constipation.   Potassium Chloride ER 20 MEQ TBCR  Take 2 tablets by mouth every morning.   QUEtiapine (SEROQUEL) 50 MG tablet TAKE 1 TABLET BY MOUTH EVERYDAY AT BEDTIME   rosuvastatin (CRESTOR) 40 MG tablet Take 1 tablet (40 mg total) by mouth daily.   traMADol (ULTRAM) 50 MG tablet TAKE 1 TABLET 3 TIMES A DAY AND CAN TAKE 1 EXTRA 20 DAYS/MONTH   No facility-administered encounter medications on file as of 01/19/2021.    Patient Active Problem List   Diagnosis Date Noted   Fall (on)(from) incline, initial  encounter 11/23/2020   Macrocytosis without anemia 11/22/2020   Unwitnessed fall 11/22/2020   Late onset Alzheimer's dementia with behavioral disturbance ([place]) 11/18/2020   Visual hallucination 11/18/2020   Sundowning 11/18/2020   Non-small cell carcinoma of right lung, stage 1 ([place]) 06/08/2020   Pulmonary nodule 1 cm or greater in diameter 05/21/2020   Atherosclerotic heart disease of native coronary artery with other forms of angina pectoris ([place]) 04/22/2020   Memory loss 02/18/2019   Vitamin B12 deficiency 10/23/2018   Stage 3 chronic kidney disease ([place]) 06/03/2018   Chronic anemia 06/03/2018   Hypercholesterolemia 06/03/2018   Malnutrition of moderate degree 07/12/2017   Hypoalbuminemia 07/11/2017   Elevated LFTs 07/11/2017   Snoring 12/21/2016   Peripheral musculoskeletal gait disorder 02/05/2015   Lumbar post-laminectomy syndrome 02/05/2015   CAD in native artery 10/16/2014   NSTEMI (non-ST elevated myocardial infarction) ([place]) 09/13/2014   Cardiomyopathy, ischemic 78/46/9629   Chronic systolic heart failure ([place])    Tobacco abuse 09/12/2014   Dyslipidemia 09/12/2014   Normocytic anemia 09/12/2014   Chronic diastolic heart failure, NYHA class 1 ([place]) 09/12/2014   Postlaminectomy syndrome, cervical region 08/01/2013   Chronic midline low back pain with left-sided sciatica 08/01/2013   Shoulder joint contracture 08/01/2013   HTN (hypertension) 03/03/2013   Abnormal genetic test 03/03/2013    Conditions to be addressed/monitored: Stage 3a chronic kidney disease, HTN, chronic systolic heart failure, Mild Cognitive Impairment, Atherosclerotic heart disease of native coronary artery with other forms of angina pectoris, Lung Nodule    Care Plan : Wellness (Adult)  Updates made by Lynne Logan, RN since 01/19/2021 12:00 AM     Problem: Medication Adherence (Wellness)   Priority: High     Goal: Medication Adherence Maintained   Start Date: 12/24/2020  Expected End Date:  04/13/2021  Recent Progress: On track  Priority: High  Note:   Current Barriers:  Ineffective Self Health Maintenance in a patient with Stage 3a chronic kidney disease, HTN, chronic systolic heart failure, Mild Cognitive Impairment, Atherosclerotic heart disease of native coronary artery with other forms of angina pectoris, Lung Nodule   Clinical Goal(s):  Collaboration with [name], FNP regarding development and update of comprehensive plan of care as evidenced by provider attestation and co-signature Inter-disciplinary care team collaboration (see longitudinal plan of care) patient will work with care management team to address care coordination and chronic disease management needs related to Disease Management Educational Needs Care Coordination Medication Management and Education Psychosocial Support Dementia and Caregiver Support   Interventions:  12/24/20 completed inbound call with son [name] Evaluation of current treatment plan related to Stage 3a chronic kidney disease, HTN, chronic systolic heart failure, Mild Cognitive Impairment, Atherosclerotic heart disease of native coronary artery with other forms of angina pectoris, Lung Nodule  ,  self-management and patient's adherence to plan as established by provider. Collaboration with [name], FNP regarding development and update of comprehensive plan of care as evidenced by provider attestation  and co-signature Inter-disciplinary care team collaboration (see longitudinal plan of care) Received inbound call from son [name] stating he was expecting to receive his mother's medications on Wednesday this week, however he has not received any of her medications, he states her supply is getting very low Reviewed chart, noted patient's medications have been transferred to [facility] Provided son [name] of the contact number for [facility], completed a joint call with [facility], spoke with  Eustace Pen Determined son [name] is working this afternoon and will not be able to receive the medications this afternoon upon delivery  Determined Eustace Pen will deliver the patient's medications tomorrow per okay by son [name], she will notify him today of any copays or issues that may arise, she will call him tomorrow to confirm the delivery time Discussed plans with patient for ongoing care management follow up and provided patient with direct contact information for care management team 12/29/20 collaboration with [facility]  Received inbound call from son [name], voice message states he did not receive a call from the pharmacy on Saturday nor did he receive his mother's medications Spoke with [facility] for a status update concerning patients medications, spoke with Hailey who advised due to having some medication discrepancies this patient's medication delivery are on hold until the embedded pharmacy team speaks with son [name] the patient's son [name] left a voice message concerned his mother will run out of her medications  Collaborated with embedded pharm D Orlando Penner concerning son's concerns and request for help with getting her medications refilled/delivered Determined Coralyn Helling and her pharmacy team are communicating with son [name] for medication reconciliation in order to help get her medications refilled/delivered 01/19/21 completed inbound call from son [name] Determined son [name] is calling today to advise [facility] did not fill his mother's Tramadol Determined he did not receive a pill pack as expected and has several other questions concerning the refill process  Provided [name] with the phone number for [facility] and advised him to contact the pharmacy to get any/all questions answered, he verbalizes understanding  Sent in basket message to embedded Mordecai Rasmussen regarding inbound call from son [name] and his concerns  Discussed plans with  patient for ongoing care management follow up and provided patient with direct contact information for care management team Self Care Activities:  With assistance from son [name], take all medications as prescribed Contact PCP and or embedded Pharm D for questions regarding medications or medication refills Ensure refills are on the way when medication supply is getting low Notify PCP if unable to take medications as prescribed  Patient Goals: - call [facility] for questions related to patient's future medication refills  Follow Up Plan: Telephone follow up appointment with care management team member scheduled for: 02/07/21     Plan:Telephone follow up appointment with care management team member scheduled for:  02/07/21  Barb Merino, RN, BSN, CCM Care Management Coordinator [place] [facility]  Direct Phone: [phone]

## 2021-01-21 ENCOUNTER — Ambulatory Visit: Payer: Medicare Other

## 2021-01-21 DIAGNOSIS — I10 Essential (primary) hypertension: Secondary | ICD-10-CM

## 2021-01-21 DIAGNOSIS — G3184 Mild cognitive impairment, so stated: Secondary | ICD-10-CM

## 2021-01-21 DIAGNOSIS — F02818 Dementia in other diseases classified elsewhere, unspecified severity, with other behavioral disturbance: Secondary | ICD-10-CM

## 2021-01-21 DIAGNOSIS — G301 Alzheimer's disease with late onset: Secondary | ICD-10-CM

## 2021-01-21 DIAGNOSIS — N1831 Chronic kidney disease, stage 3a: Secondary | ICD-10-CM

## 2021-01-21 NOTE — Chronic Care Management (AMB) (Signed)
Chronic Care Management    Social Work Note  01/21/2021 Name: Sara Fox MRN: [id number] DOB: 04-15-1941  Sara Fox is a 80 y.o. year old female who is a primary care patient of [name], [place]. The CCM team was consulted to assist the patient with chronic disease management and/or care coordination needs related to:  HTN, CHF, CKD III, and mild cognitive impairment .   Engaged with patients son [name] by phone  for follow up visit in response to provider referral for social work chronic care management and care coordination services.   Consent to Services:  The patient was given information about Chronic Care Management services, agreed to services, and gave verbal consent prior to initiation of services.  Please see initial visit note for detailed documentation.   Patient agreed to services and consent obtained.   Assessment: Review of patient past medical history, allergies, medications, and health status, including review of relevant consultants reports was performed today as part of a comprehensive evaluation and provision of chronic care management and care coordination services.     SDOH (Social Determinants of Health) assessments and interventions performed:    Advanced Directives Status: Not addressed in this encounter.  CCM Care Plan  Allergies  Allergen Reactions   Buprenorphine Hcl Shortness Of Breath    Throat swelling/trouble breathing and lethargic   Morphine And Related Shortness Of Breath    Throat swelling/trouble breathing and lethargic   Celebrex [Celecoxib] Diarrhea and Swelling    Swelling of legs    Vioxx [Rofecoxib] Palpitations   Codeine Other (See Comments)    Bloated     Outpatient Encounter Medications as of 01/21/2021  Medication Sig   aspirin EC 81 MG tablet Take 81 mg by mouth daily as needed (headache).   budesonide-formoterol (SYMBICORT) 160-4.5 MCG/ACT inhaler TAKE 2 PUFFS BY MOUTH TWICE A DAY   Calcium Citrate-Vitamin D  (CALCIUM CITRATE+D3 PETITES PO) Take 1 tablet by mouth daily.   cholecalciferol (VITAMIN D) 25 MCG (1000 UNIT) tablet Take 1,000 Units by mouth daily.   ciclopirox (PENLAC) 8 % solution Apply 1 application topically at bedtime. Apply over nail and surrounding skin. Apply daily over previous coat. After seven (7) days, file nail and continue cycle.   [employer] MAGNESIUM OXIDE 250 MG TABS TAKE 1 TABLET BY MOUTH WITH EVENING MEAL DAILY   donepezil (ARICEPT) 10 MG tablet Take 1 tablet (10 mg total) by mouth at bedtime.   ferrous sulfate 325 (65 FE) MG tablet Take 325 mg by mouth daily with breakfast.   furosemide (LASIX) 20 MG tablet Take 1 tablet (20 mg total) by mouth every other day.   gabapentin (NEURONTIN) 100 MG capsule Take 1 capsule (100 mg total) by mouth 2 (two) times daily.   ibuprofen (ADVIL) 200 MG tablet Take 400 mg by mouth every 6 (six) hours as needed (pain).   memantine (NAMENDA) 5 MG tablet Take 1 tablet (5 mg total) by mouth daily.   metoprolol tartrate (LOPRESSOR) 25 MG tablet Take 1 tablet (25 mg total) by mouth 2 (two) times daily.   Multiple Vitamin (MULTIVITAMIN WITH MINERALS) TABS tablet Take 1 tablet by mouth daily.   nitroGLYCERIN (NITROSTAT) 0.4 MG SL tablet PLACE ONE TABLET UNDER THE TONGUE EVERY 5 MINUTES AS NEEDED FOR CHEST PAIN.   oxybutynin (DITROPAN) 5 MG tablet Take 0.5 tablets (2.5 mg total) by mouth 2 (two) times daily.   pantoprazole (PROTONIX) 40 MG tablet Take 1 tablet (40 mg total) by mouth  2 (two) times daily.   polyethylene glycol (MIRALAX / GLYCOLAX) packet Take 17 g by mouth daily as needed for mild constipation.   Potassium Chloride ER 20 MEQ TBCR Take 2 tablets by mouth every morning.   QUEtiapine (SEROQUEL) 50 MG tablet TAKE 1 TABLET BY MOUTH EVERYDAY AT BEDTIME   rosuvastatin (CRESTOR) 40 MG tablet Take 1 tablet (40 mg total) by mouth daily.   traMADol (ULTRAM) 50 MG tablet TAKE 1 TABLET 3 TIMES A DAY AND CAN TAKE 1 EXTRA 20 DAYS/MONTH   No  facility-administered encounter medications on file as of 01/21/2021.    Patient Active Problem List   Diagnosis Date Noted   Fall (on)(from) incline, initial encounter 11/23/2020   Macrocytosis without anemia 11/22/2020   Unwitnessed fall 11/22/2020   Late onset Alzheimer's dementia with behavioral disturbance ([place]) 11/18/2020   Visual hallucination 11/18/2020   Sundowning 11/18/2020   Non-small cell carcinoma of right lung, stage 1 ([place]) 06/08/2020   Pulmonary nodule 1 cm or greater in diameter 05/21/2020   Atherosclerotic heart disease of native coronary artery with other forms of angina pectoris ([place]) 04/22/2020   Memory loss 02/18/2019   Vitamin B12 deficiency 10/23/2018   Stage 3 chronic kidney disease ([place]) 06/03/2018   Chronic anemia 06/03/2018   Hypercholesterolemia 06/03/2018   Malnutrition of moderate degree 07/12/2017   Hypoalbuminemia 07/11/2017   Elevated LFTs 07/11/2017   Snoring 12/21/2016   Peripheral musculoskeletal gait disorder 02/05/2015   Lumbar post-laminectomy syndrome 02/05/2015   CAD in native artery 10/16/2014   NSTEMI (non-ST elevated myocardial infarction) ([place]) 09/13/2014   Cardiomyopathy, ischemic 07/62/2633   Chronic systolic heart failure ([place])    Tobacco abuse 09/12/2014   Dyslipidemia 09/12/2014   Normocytic anemia 09/12/2014   Chronic diastolic heart failure, NYHA class 1 ([place]) 09/12/2014   Postlaminectomy syndrome, cervical region 08/01/2013   Chronic midline low back pain with left-sided sciatica 08/01/2013   Shoulder joint contracture 08/01/2013   HTN (hypertension) 03/03/2013   Abnormal genetic test 03/03/2013    Conditions to be addressed/monitored: CHF, HTN, CKD Stage III, and Mild Cognitive Impairment  Care Plan : Social Work [facility] Care Plan  Updates made by [name] since 01/21/2021 12:00 AM     Problem: Disease Progression      Goal: Disease Progression Managed   Start Date: 11/12/2020  Priority: High  Note:   Current  Barriers:  Chronic disease management support and education needs related to CHF, HTN, CKD Stage III, and Mild Cognitive Impairment   Increased Agitation  Social Worker Clinical Goal(s):  patient will work with SW to identify and address any acute and/or chronic care coordination needs related to the self health management of CHF, HTN, CKD Stage III, and Mild Cognitive Impairment   Patient and her son will work with SW to identify long term care options Patient, with the help of her son, will visit the ED to evaluate cause of increased agitation and combativeness Goal not met  SW Interventions:  Inter-disciplinary care team collaboration (see longitudinal plan of care) Collaboration with [name], FNP regarding development and update of comprehensive plan of care as evidenced by provider attestation and co-signature Collaboration with [place] who indicates she has spoken with patients son [name] and he reports he has not followed up with [facility] due to wanting the patient to tour the facility with him. [name] indicates he is having difficulty getting the patient to go with him Successful outbound call placed to  [name] who indicates patient is doing well at this time He does not have a scheduled tour planned with [facility] at this time Discussed the patients medication delivery had some discrepancies, [name] has contacted the pharmacy to have them corrected and is awaiting a return call Advised [name] to contact the care management team as needed Scheduled follow up call over the next month  Patient Goals/Self-Care Activities patient will: with the help of her son  -  Engage with [facility] to become more knowledgeable of the program -Contact SW as needed prior to next scheduled call  Follow Up Plan:  SW will follow up with patient and her son over the next month       Follow Up Plan: SW will follow up with patient by phone over the next month       [name], BSW, CDP Social Worker, Certified Dementia Practitioner [place] [facility] [place] [facility] [phone]

## 2021-01-21 NOTE — Patient Instructions (Signed)
Social Worker Visit Information  Goals we discussed today:   Goals Addressed             This Visit's Progress    Disease Progression Managed       Timeframe:  Short-Term Goal Priority:  High Start Date:  7.1.22                                            Next planned outreach: 02/18/21   Patient Goals/Self-Care Activities patient will: with the help of her son  - Engage with [facility] to become more knowledgeable of the program -Contact SW as needed prior to next scheduled call         Follow Up Plan: SW will follow up with patient by phone over the next month   [name], BSW, CDP Social Worker, Certified Dementia Practitioner [place] [facility] [place] [facility] [phone]

## 2021-01-27 ENCOUNTER — Telehealth: Payer: Medicare Other

## 2021-01-27 ENCOUNTER — Ambulatory Visit: Payer: Self-pay

## 2021-01-27 DIAGNOSIS — I25118 Atherosclerotic heart disease of native coronary artery with other forms of angina pectoris: Secondary | ICD-10-CM

## 2021-01-27 DIAGNOSIS — F0281 Dementia in other diseases classified elsewhere with behavioral disturbance: Secondary | ICD-10-CM

## 2021-01-27 DIAGNOSIS — N1831 Chronic kidney disease, stage 3a: Secondary | ICD-10-CM

## 2021-01-27 DIAGNOSIS — I5022 Chronic systolic (congestive) heart failure: Secondary | ICD-10-CM

## 2021-01-27 DIAGNOSIS — F02818 Dementia in other diseases classified elsewhere, unspecified severity, with other behavioral disturbance: Secondary | ICD-10-CM

## 2021-01-27 DIAGNOSIS — G3184 Mild cognitive impairment, so stated: Secondary | ICD-10-CM

## 2021-01-27 DIAGNOSIS — I10 Essential (primary) hypertension: Secondary | ICD-10-CM

## 2021-01-27 DIAGNOSIS — C3491 Malignant neoplasm of unspecified part of right bronchus or lung: Secondary | ICD-10-CM

## 2021-01-31 ENCOUNTER — Telehealth: Payer: Medicare Other

## 2021-01-31 ENCOUNTER — Telehealth: Payer: Self-pay

## 2021-01-31 ENCOUNTER — Other Ambulatory Visit: Payer: Self-pay | Admitting: Nurse Practitioner

## 2021-01-31 ENCOUNTER — Ambulatory Visit: Payer: Self-pay

## 2021-01-31 DIAGNOSIS — M545 Low back pain, unspecified: Secondary | ICD-10-CM

## 2021-01-31 DIAGNOSIS — I5022 Chronic systolic (congestive) heart failure: Secondary | ICD-10-CM

## 2021-01-31 DIAGNOSIS — G8929 Other chronic pain: Secondary | ICD-10-CM

## 2021-01-31 DIAGNOSIS — F0281 Dementia in other diseases classified elsewhere with behavioral disturbance: Secondary | ICD-10-CM

## 2021-01-31 DIAGNOSIS — F02818 Dementia in other diseases classified elsewhere, unspecified severity, with other behavioral disturbance: Secondary | ICD-10-CM

## 2021-01-31 DIAGNOSIS — I25118 Atherosclerotic heart disease of native coronary artery with other forms of angina pectoris: Secondary | ICD-10-CM

## 2021-01-31 DIAGNOSIS — G3184 Mild cognitive impairment, so stated: Secondary | ICD-10-CM

## 2021-01-31 DIAGNOSIS — I10 Essential (primary) hypertension: Secondary | ICD-10-CM

## 2021-01-31 DIAGNOSIS — C3491 Malignant neoplasm of unspecified part of right bronchus or lung: Secondary | ICD-10-CM

## 2021-01-31 DIAGNOSIS — G301 Alzheimer's disease with late onset: Secondary | ICD-10-CM

## 2021-01-31 DIAGNOSIS — N1831 Chronic kidney disease, stage 3a: Secondary | ICD-10-CM

## 2021-01-31 MED ORDER — TRAMADOL HCL 50 MG PO TABS: ORAL_TABLET | ORAL | 0 refills | Status: DC

## 2021-01-31 NOTE — Chronic Care Management (AMB) (Signed)
Chronic Care Management   CCM RN Visit Note  01/27/2021 Name: Sara Fox MRN: [id number] DOB: December 18, 1940  Subjective: Sara Fox is a 80 y.o. year old female who is a primary care patient of [name], [place]. The care management team was consulted for assistance with disease management and care coordination needs.    Engaged with patient by telephone for follow up visit in response to provider referral for case management and/or care coordination services.   Consent to Services:  The patient was given information about Chronic Care Management services, agreed to services, and gave verbal consent prior to initiation of services.  Please see initial visit note for detailed documentation.   Patient agreed to services and verbal consent obtained.   Assessment: Review of patient past medical history, allergies, medications, health status, including review of consultants reports, laboratory and other test data, was performed as part of comprehensive evaluation and provision of chronic care management services.   SDOH (Social Determinants of Health) assessments and interventions performed:  No new challenges   CCM Care Plan  Allergies  Allergen Reactions   Buprenorphine Hcl Shortness Of Breath    Throat swelling/trouble breathing and lethargic   Morphine And Related Shortness Of Breath    Throat swelling/trouble breathing and lethargic   Celebrex [Celecoxib] Diarrhea and Swelling    Swelling of legs    Vioxx [Rofecoxib] Palpitations   Codeine Other (See Comments)    Bloated     Outpatient Encounter Medications as of 01/27/2021  Medication Sig   aspirin EC 81 MG tablet Take 81 mg by mouth daily as needed (headache).   budesonide-formoterol (SYMBICORT) 160-4.5 MCG/ACT inhaler TAKE 2 PUFFS BY MOUTH TWICE A DAY   Calcium Citrate-Vitamin D (CALCIUM CITRATE+D3 PETITES PO) Take 1 tablet by mouth daily.   cholecalciferol (VITAMIN D) 25 MCG (1000 UNIT) tablet Take 1,000 Units by  mouth daily.   ciclopirox (PENLAC) 8 % solution Apply 1 application topically at bedtime. Apply over nail and surrounding skin. Apply daily over previous coat. After seven (7) days, file nail and continue cycle.   CVS MAGNESIUM OXIDE 250 MG TABS TAKE 1 TABLET BY MOUTH WITH EVENING MEAL DAILY   donepezil (ARICEPT) 10 MG tablet Take 1 tablet (10 mg total) by mouth at bedtime.   ferrous sulfate 325 (65 FE) MG tablet Take 325 mg by mouth daily with breakfast.   furosemide (LASIX) 20 MG tablet Take 1 tablet (20 mg total) by mouth every other day.   gabapentin (NEURONTIN) 100 MG capsule Take 1 capsule (100 mg total) by mouth 2 (two) times daily.   ibuprofen (ADVIL) 200 MG tablet Take 400 mg by mouth every 6 (six) hours as needed (pain).   memantine (NAMENDA) 5 MG tablet Take 1 tablet (5 mg total) by mouth daily.   metoprolol tartrate (LOPRESSOR) 25 MG tablet Take 1 tablet (25 mg total) by mouth 2 (two) times daily.   Multiple Vitamin (MULTIVITAMIN WITH MINERALS) TABS tablet Take 1 tablet by mouth daily.   nitroGLYCERIN (NITROSTAT) 0.4 MG SL tablet PLACE ONE TABLET UNDER THE TONGUE EVERY 5 MINUTES AS NEEDED FOR CHEST PAIN.   oxybutynin (DITROPAN) 5 MG tablet Take 0.5 tablets (2.5 mg total) by mouth 2 (two) times daily.   pantoprazole (PROTONIX) 40 MG tablet Take 1 tablet (40 mg total) by mouth 2 (two) times daily.   polyethylene glycol (MIRALAX / GLYCOLAX) packet Take 17 g by mouth daily as needed for mild constipation.   Potassium Chloride ER  20 MEQ TBCR Take 2 tablets by mouth every morning.   QUEtiapine (SEROQUEL) 50 MG tablet TAKE 1 TABLET BY MOUTH EVERYDAY AT BEDTIME   rosuvastatin (CRESTOR) 40 MG tablet Take 1 tablet (40 mg total) by mouth daily.   [DISCONTINUED] traMADol (ULTRAM) 50 MG tablet TAKE 1 TABLET 3 TIMES A DAY AND CAN TAKE 1 EXTRA 20 DAYS/MONTH   No facility-administered encounter medications on file as of 01/27/2021.    Patient Active Problem List   Diagnosis Date Noted   Fall  (on)(from) incline, initial encounter 11/23/2020   Macrocytosis without anemia 11/22/2020   Unwitnessed fall 11/22/2020   Late onset Alzheimer's dementia with behavioral disturbance ([place]) 11/18/2020   Visual hallucination 11/18/2020   Sundowning 11/18/2020   Non-small cell carcinoma of right lung, stage 1 ([place]) 06/08/2020   Pulmonary nodule 1 cm or greater in diameter 05/21/2020   Atherosclerotic heart disease of native coronary artery with other forms of angina pectoris ([place]) 04/22/2020   Memory loss 02/18/2019   Vitamin B12 deficiency 10/23/2018   Stage 3 chronic kidney disease ([place]) 06/03/2018   Chronic anemia 06/03/2018   Hypercholesterolemia 06/03/2018   Malnutrition of moderate degree 07/12/2017   Hypoalbuminemia 07/11/2017   Elevated LFTs 07/11/2017   Snoring 12/21/2016   Peripheral musculoskeletal gait disorder 02/05/2015   Lumbar post-laminectomy syndrome 02/05/2015   CAD in native artery 10/16/2014   NSTEMI (non-ST elevated myocardial infarction) ([place]) 09/13/2014   Cardiomyopathy, ischemic 29/92/4268   Chronic systolic heart failure ([place])    Tobacco abuse 09/12/2014   Dyslipidemia 09/12/2014   Normocytic anemia 09/12/2014   Chronic diastolic heart failure, NYHA class 1 ([place]) 09/12/2014   Postlaminectomy syndrome, cervical region 08/01/2013   Chronic midline low back pain with left-sided sciatica 08/01/2013   Shoulder joint contracture 08/01/2013   HTN (hypertension) 03/03/2013   Abnormal genetic test 03/03/2013    Conditions to be addressed/monitored: Stage 3a chronic kidney disease, HTN, chronic systolic heart failure, Mild Cognitive Impairment, Atherosclerotic heart disease of native coronary artery with other forms of angina pectoris, Lung Nodule    Care Plan : Wellness (Adult)  Updates made by Lynne Logan, RN since 01/27/2021 12:00 AM     Problem: Medication Adherence (Wellness)   Priority: High     Goal: Medication Adherence Maintained   Start Date:  12/24/2020  Expected End Date: 04/13/2021  Recent Progress: On track  Priority: High  Note:   Current Barriers:  Ineffective Self Health Maintenance in a patient with Stage 3a chronic kidney disease, HTN, chronic systolic heart failure, Mild Cognitive Impairment, Atherosclerotic heart disease of native coronary artery with other forms of angina pectoris, Lung Nodule   Clinical Goal(s):  Collaboration with [name], FNP regarding development and update of comprehensive plan of care as evidenced by provider attestation and co-signature Inter-disciplinary care team collaboration (see longitudinal plan of care) patient will work with care management team to address care coordination and chronic disease management needs related to Disease Management Educational Needs Care Coordination Medication Management and Education Psychosocial Support Dementia and Caregiver Support   Interventions:  12/24/20 completed inbound call with son [name] Evaluation of current treatment plan related to Stage 3a chronic kidney disease, HTN, chronic systolic heart failure, Mild Cognitive Impairment, Atherosclerotic heart disease of native coronary artery with other forms of angina pectoris, Lung Nodule  ,  self-management and patient's adherence to plan as established by provider. Collaboration with [name], FNP regarding development and update of comprehensive plan of care as evidenced by provider  attestation       and co-signature Inter-disciplinary care team collaboration (see longitudinal plan of care) Received inbound call from son [name] stating he was expecting to receive his mother's medications on Wednesday this week, however he has not received any of her medications, he states her supply is getting very low Reviewed chart, noted patient's medications have been transferred to [place] Provided son [name] of the contact number for [facility], completed a joint call with [facility], spoke with Eustace Pen Determined son [name] is working this afternoon and will not be able to receive the medications this afternoon upon delivery  Determined Eustace Pen will deliver the patient's medications tomorrow per okay by son [name], she will notify him today of any copays or issues that may arise, she will call him tomorrow to confirm the delivery time Discussed plans with patient for ongoing care management follow up and provided patient with direct contact information for care management team 12/29/20 collaboration with [facility]  Received inbound call from son [name], voice message states he did not receive a call from the pharmacy on Saturday nor did he receive his mother's medications Spoke with [facility] for a status update concerning patients medications, spoke with Hailey who advised due to having some medication discrepancies this patient's medication delivery are on hold until the embedded pharmacy team speaks with son [name] the patient's son [name] left a voice message concerned his mother will run out of her medications  Collaborated with embedded pharm D Orlando Penner concerning son's concerns and request for help with getting her medications refilled/delivered Determined Coralyn Helling and her pharmacy team are communicating with son [name] for medication reconciliation in order to help get her medications refilled/delivered 01/19/21 completed inbound call with son [name] Determined son [name] is calling today to advise [facility] did not fill his mother's Tramadol Determined he did not receive a pill pack as expected and has several other questions concerning the refill process  Provided [name] with the phone number for [facility] and advised him to contact the pharmacy to get any/all questions answered, he verbalizes understanding  Sent in basket message to embedded Mordecai Rasmussen regarding inbound call from son [name] and his concerns   Discussed plans with patient for ongoing care management follow up and provided patient with direct contact information for care management team 01/27/21 completed inbound call with son [name] Determined son [name] is concerned that his mother's Tramadol has not been delivered by [facility] despite being told this medication was filled and ready for delivery Determined [name] attempted to contact [facility] to inquire about this medication abut was unable to resolve the issue Completed joint call with [name] and [facility] to learn the patient's prescriptions were transferred [facility] to preferred [facility] [address] [facility] [place], [name] was previously unaware Completed a joint call with [facility] and [name] to learn there are no refills for Ms. Hebb's Tramadol with her last fill being completed in July 2022 Collaborated with embedded Pharm D Orlando Penner who advised the original prescriber of Ms. Zale's Tramadol did not approve the refill Confirmed per patient's son [name], patient is no longer following the oringinal prescriber for back pain due to her Dementia worsening and [name] has much difficulty getting patient to mulitple MD appointments Discussed he will not pursue the [facility] program at this time but is interested in learning more about [place] in basket message to embedded  BSW [name] requesting resources regarding [employer] be provided to [name] Discussed PCP is out of the office today, discussed following up with PCP at next available time to request she consider refilling patient's Tramadol and [name] is agreeable Discussed plans with patient for ongoing care management follow up and provided patient with direct contact information for care management team Self Care Activities:  With assistance from son [name], take all medications as prescribed Contact PCP and or embedded Pharm D for  questions regarding medications or medication refills Ensure refills are on the way when medication supply is getting low Notify PCP if unable to take medications as prescribed  Patient Goals: - call [facility] for questions related to patient's future medication refills  Follow Up Plan: Telephone follow up appointment with care management team member scheduled for: 01/31/21     Plan:Telephone follow up appointment with care management team member scheduled for:  01/31/21  Barb Merino, RN, BSN, CCM Care Management Coordinator [place] [facility]  Direct Phone: [phone]

## 2021-01-31 NOTE — Chronic Care Management (AMB) (Signed)
Chronic Care Management   CCM RN Visit Note  01/31/2021 Name: Sara Fox MRN: [id number] DOB: [birth year]  Subjective: Sara Fox is a 80 y.o. year old female who is a primary care patient of [name], [place]. The care management team was consulted for assistance with disease management and care coordination needs.    Engaged with patient by telephone for follow up visit in response to provider referral for case management and/or care coordination services.   Consent to Services:  The patient was given information about Chronic Care Management services, agreed to services, and gave verbal consent prior to initiation of services.  Please see initial visit note for detailed documentation.   Patient agreed to services and verbal consent obtained.   Assessment: Review of patient past medical history, allergies, medications, health status, including review of consultants reports, laboratory and other test data, was performed as part of comprehensive evaluation and provision of chronic care management services.   SDOH (Social Determinants of Health) assessments and interventions performed:    CCM Care Plan  Allergies  Allergen Reactions   Buprenorphine Hcl Shortness Of Breath    Throat swelling/trouble breathing and lethargic   Morphine And Related Shortness Of Breath    Throat swelling/trouble breathing and lethargic   Celebrex [Celecoxib] Diarrhea and Swelling    Swelling of legs    Vioxx [Rofecoxib] Palpitations   Codeine Other (See Comments)    Bloated     Outpatient Encounter Medications as of 01/31/2021  Medication Sig   aspirin EC 81 MG tablet Take 81 mg by mouth daily as needed (headache).   budesonide-formoterol (SYMBICORT) 160-4.5 MCG/ACT inhaler TAKE 2 PUFFS BY MOUTH TWICE A DAY   Calcium Citrate-Vitamin D (CALCIUM CITRATE+D3 PETITES PO) Take 1 tablet by mouth daily.   cholecalciferol (VITAMIN D) 25 MCG (1000 UNIT) tablet Take 1,000 Units by mouth daily.    ciclopirox (PENLAC) 8 % solution Apply 1 application topically at bedtime. Apply over nail and surrounding skin. Apply daily over previous coat. After seven (7) days, file nail and continue cycle.   [employer] MAGNESIUM OXIDE 250 MG TABS TAKE 1 TABLET BY MOUTH WITH EVENING MEAL DAILY   donepezil (ARICEPT) 10 MG tablet Take 1 tablet (10 mg total) by mouth at bedtime.   ferrous sulfate 325 (65 FE) MG tablet Take 325 mg by mouth daily with breakfast.   furosemide (LASIX) 20 MG tablet Take 1 tablet (20 mg total) by mouth every other day.   gabapentin (NEURONTIN) 100 MG capsule Take 1 capsule (100 mg total) by mouth 2 (two) times daily.   ibuprofen (ADVIL) 200 MG tablet Take 400 mg by mouth every 6 (six) hours as needed (pain).   memantine (NAMENDA) 5 MG tablet Take 1 tablet (5 mg total) by mouth daily.   metoprolol tartrate (LOPRESSOR) 25 MG tablet Take 1 tablet (25 mg total) by mouth 2 (two) times daily.   Multiple Vitamin (MULTIVITAMIN WITH MINERALS) TABS tablet Take 1 tablet by mouth daily.   nitroGLYCERIN (NITROSTAT) 0.4 MG SL tablet PLACE ONE TABLET UNDER THE TONGUE EVERY 5 MINUTES AS NEEDED FOR CHEST PAIN.   oxybutynin (DITROPAN) 5 MG tablet Take 0.5 tablets (2.5 mg total) by mouth 2 (two) times daily.   pantoprazole (PROTONIX) 40 MG tablet Take 1 tablet (40 mg total) by mouth 2 (two) times daily.   polyethylene glycol (MIRALAX / GLYCOLAX) packet Take 17 g by mouth daily as needed for mild constipation.   Potassium Chloride ER 20 MEQ TBCR  Take 2 tablets by mouth every morning.   QUEtiapine (SEROQUEL) 50 MG tablet TAKE 1 TABLET BY MOUTH EVERYDAY AT BEDTIME   rosuvastatin (CRESTOR) 40 MG tablet Take 1 tablet (40 mg total) by mouth daily.   traMADol (ULTRAM) 50 MG tablet TAKE 1 TABLET 3 TIMES A DAY AND CAN TAKE 1 EXTRA 20 DAYS/MONTH   No facility-administered encounter medications on file as of 01/31/2021.    Patient Active Problem List   Diagnosis Date Noted   Fall (on)(from) incline, initial  encounter 11/23/2020   Macrocytosis without anemia 11/22/2020   Unwitnessed fall 11/22/2020   Late onset Alzheimer's dementia with behavioral disturbance ([place]) 11/18/2020   Visual hallucination 11/18/2020   Sundowning 11/18/2020   Non-small cell carcinoma of right lung, stage 1 ([place]) 06/08/2020   Pulmonary nodule 1 cm or greater in diameter 05/21/2020   Atherosclerotic heart disease of native coronary artery with other forms of angina pectoris ([place]) 04/22/2020   Memory loss 02/18/2019   Vitamin B12 deficiency 10/23/2018   Stage 3 chronic kidney disease ([place]) 06/03/2018   Chronic anemia 06/03/2018   Hypercholesterolemia 06/03/2018   Malnutrition of moderate degree 07/12/2017   Hypoalbuminemia 07/11/2017   Elevated LFTs 07/11/2017   Snoring 12/21/2016   Peripheral musculoskeletal gait disorder 02/05/2015   Lumbar post-laminectomy syndrome 02/05/2015   CAD in native artery 10/16/2014   NSTEMI (non-ST elevated myocardial infarction) ([place]) 09/13/2014   Cardiomyopathy, ischemic 16/02/9603   Chronic systolic heart failure ([place])    Tobacco abuse 09/12/2014   Dyslipidemia 09/12/2014   Normocytic anemia 09/12/2014   Chronic diastolic heart failure, NYHA class 1 ([place]) 09/12/2014   Postlaminectomy syndrome, cervical region 08/01/2013   Chronic midline low back pain with left-sided sciatica 08/01/2013   Shoulder joint contracture 08/01/2013   HTN (hypertension) 03/03/2013   Abnormal genetic test 03/03/2013    Conditions to be addressed/monitored: Stage 3a chronic kidney disease, HTN, chronic systolic heart failure, Mild Cognitive Impairment, Atherosclerotic heart disease of native coronary artery with other forms of angina pectoris, Lung Nodule    Care Plan : Wellness (Adult)  Updates made by Lynne Logan, RN since 01/31/2021 12:00 AM     Problem: Medication Adherence (Wellness)   Priority: High     Goal: Medication Adherence Maintained   Start Date: 12/24/2020  Expected End Date:  04/13/2021  Recent Progress: On track  Priority: High  Note:   Current Barriers:  Ineffective Self Health Maintenance in a patient with Stage 3a chronic kidney disease, HTN, chronic systolic heart failure, Mild Cognitive Impairment, Atherosclerotic heart disease of native coronary artery with other forms of angina pectoris, Lung Nodule   Clinical Goal(s):  Collaboration with [name], FNP regarding development and update of comprehensive plan of care as evidenced by provider attestation and co-signature Inter-disciplinary care team collaboration (see longitudinal plan of care) patient will work with care management team to address care coordination and chronic disease management needs related to Disease Management Educational Needs Care Coordination Medication Management and Education Psychosocial Support Dementia and Caregiver Support   Interventions:  12/24/20 completed inbound call with son [name] Evaluation of current treatment plan related to Stage 3a chronic kidney disease, HTN, chronic systolic heart failure, Mild Cognitive Impairment, Atherosclerotic heart disease of native coronary artery with other forms of angina pectoris, Lung Nodule  ,  self-management and patient's adherence to plan as established by provider. Collaboration with [name], FNP regarding development and update of comprehensive plan of care as evidenced by provider attestation  and co-signature Inter-disciplinary care team collaboration (see longitudinal plan of care) Received inbound call from son [name] stating he was expecting to receive his mother's medications on Wednesday this week, however he has not received any of her medications, he states her supply is getting very low Reviewed chart, noted patient's medications have been transferred to [facility] [facility] Provided son [name] of the contact number for [facility], completed a joint call with [facility], spoke with  Eustace Pen Determined son [name] is working this afternoon and will not be able to receive the medications this afternoon upon delivery  Determined Eustace Pen will deliver the patient's medications tomorrow per okay by son [name], she will notify him today of any copays or issues that may arise, she will call him tomorrow to confirm the delivery time Discussed plans with patient for ongoing care management follow up and provided patient with direct contact information for care management team 12/29/20 collaboration with [facility]  Received inbound call from son [name], voice message states he did not receive a call from the pharmacy on Saturday nor did he receive his mother's medications Spoke with [facility] for a status update concerning patients medications, spoke with Hailey who advised due to having some medication discrepancies this patient's medication delivery are on hold until the embedded pharmacy team speaks with son [name] the patient's son [name] left a voice message concerned his mother will run out of her medications  Collaborated with embedded pharm D Orlando Penner concerning son's concerns and request for help with getting her medications refilled/delivered Determined Coralyn Helling and her pharmacy team are communicating with son [name] for medication reconciliation in order to help get her medications refilled/delivered 01/19/21 completed inbound call with son [name] Determined son [name] is calling today to advise [facility] did not fill his mother's Tramadol Determined he did not receive a pill pack as expected and has several other questions concerning the refill process  Provided [name] with the phone number for [facility] and advised him to contact the pharmacy to get any/all questions answered, he verbalizes understanding  Sent in basket message to embedded Mordecai Rasmussen regarding inbound call from son [name] and his concerns  Discussed plans with  patient for ongoing care management follow up and provided patient with direct contact information for care management team 01/27/21 completed inbound call with son [name] Determined son [name] is concerned that his mother's Tramadol has not been delivered by [facility] despite being told this medication was filled and ready for delivery Determined [name] attempted to contact [facility] to inquire about this medication abut was unable to resolve the issue Completed joint call with [name] and [facility] to learn the patient's prescriptions were transferred [facility] to preferred [facility] [address] [facility] [place], [name] was previously unaware Completed a joint call with [facility] and [name] to learn there are no refills for Ms. Brindley's Tramadol with her last fill being completed in July 2022 Collaborated with embedded Pharm D Orlando Penner who advised the original prescriber of Ms. Comacho's Tramadol did not approve the refill Confirmed per patient's son [name], patient is no longer following the oringinal prescriber for back pain due to her Dementia worsening and [name] has much difficulty getting patient to mulitple MD appointments Discussed he will not pursue the PACE program at this time but is interested in learning more about [facility] Sent in basket message to embedded BSW [name] requesting resources regarding [facility]  [facility] be provided to [name] Discussed PCP is out of the office today, discussed following up with PCP at next available time to request she consider refilling patient's Tramadol and [name] is agreeable Discussed plans with patient for ongoing care management follow up and provided patient with direct contact information for care management team 01/31/21 Completed successful outbound call with son Metro Kung with PCP [name] FNP regarding patient's need and request per son [name] for a refill  for Tramadol  Determined PCP will send in a refill for Tramadol to patient's preferred pharmacy and will evaluate patient's pain and need for ongoing pain management needs during her next PCP OV scheduled for 02/02/21 Spoke with son [name] with a patient update regarding patient's Tramadol refill to be sent to preferred pharmacy per PCP  Reviewed scheduled/upcoming provider appointments including: next PCP follow up appointment scheduled for 02/02/21 @10 :78 AM  Discussed plans with patient for ongoing care management follow up and provided patient with direct contact information for care management team Self Care Activities:  With assistance from son [name], take all medications as prescribed Contact PCP and or embedded Pharm D for questions regarding medications or medication refills Ensure refills are on the way when medication supply is getting low Notify PCP if unable to take medications as prescribed  Patient Goals: - follow up with PCP for evaluation and treatment of chronic pain  Follow Up Plan: Telephone follow up appointment with care management team member scheduled for: 02/07/21     Plan:Telephone follow up appointment with care management team member scheduled for:  02/07/21  Barb Merino, RN, BSN, CCM Care Management Coordinator [place] [facility]  Direct Phone: [phone]

## 2021-01-31 NOTE — Telephone Encounter (Signed)
  Care Management   Follow Up Note   01/31/2021 Name: Sara Fox MRN: [id number] DOB: March 08, 1941   Referred by: [name], FNP Reason for referral : Chronic Care Management (RN CM Follow up call )  An unsuccessful telephone outreach was attempted today. The patient was referred to the case management team for assistance with care management and care coordination.   Follow Up Plan: A HIPPA compliant phone message was left for the patient providing contact information and requesting a return call.   Barb Merino, RN, BSN, CCM Care Management Coordinator [place] [facility]  Direct Phone: [phone]

## 2021-01-31 NOTE — Patient Instructions (Signed)
Visit Information  PATIENT GOALS:  Goals Addressed      Medication Adherence Maintained   On track    Timeframe:  Long-Range Goal Priority:  High Start Date: 12/24/20                            Expected End Date: 04/13/21  Next scheduled follow up:  01/31/21         Self Care Activities:  With assistance from son [name], take all medications as prescribed Contact PCP and or embedded Pharm D for questions regarding medications or medication refills Ensure refills are on the way when medication supply is getting low Notify PCP if unable to take medications as prescribed  Patient Goals: - call [facility] for questions related to patient's future medication refills                    The patient verbalized understanding of instructions, educational materials, and care plan provided today and declined offer to receive copy of patient instructions, educational materials, and care plan.   Telephone follow up appointment with care management team member scheduled for: 01/31/21  Barb Merino, RN, BSN, CCM Care Management Coordinator [place] [facility]  Direct Phone: [phone]

## 2021-01-31 NOTE — Patient Instructions (Signed)
Visit Information  PATIENT GOALS:  Goals Addressed      Medication Adherence Maintained   On track    Timeframe:  Long-Range Goal Priority:  High Start Date: 12/24/20                            Expected End Date: 04/13/21  Next scheduled follow up:  02/07/21         Self Care Activities:  With assistance from son [name], take all medications as prescribed Contact PCP and or embedded Pharm D for questions regarding medications or medication refills Ensure refills are on the way when medication supply is getting low Notify PCP if unable to take medications as prescribed  Patient Goals: - follow up with PCP for evaluation and treatment of chronic pain                   The patient verbalized understanding of instructions, educational materials, and care plan provided today and declined offer to receive copy of patient instructions, educational materials, and care plan.   Telephone follow up appointment with care management team member scheduled for: 02/07/21  Barb Merino, RN, BSN, CCM Care Management Coordinator [place] [facility]  Direct Phone: [phone]

## 2021-02-01 ENCOUNTER — Ambulatory Visit: Payer: Medicare Other

## 2021-02-01 DIAGNOSIS — F02818 Dementia in other diseases classified elsewhere, unspecified severity, with other behavioral disturbance: Secondary | ICD-10-CM

## 2021-02-01 DIAGNOSIS — N1831 Chronic kidney disease, stage 3a: Secondary | ICD-10-CM

## 2021-02-01 DIAGNOSIS — G301 Alzheimer's disease with late onset: Secondary | ICD-10-CM

## 2021-02-01 DIAGNOSIS — I10 Essential (primary) hypertension: Secondary | ICD-10-CM

## 2021-02-01 NOTE — Patient Instructions (Signed)
Social Worker Visit Information  Goals we discussed today:   Goals Addressed             This Visit's Progress    Disease Progression Managed       Timeframe:  Short-Term Goal Priority:  High Start Date:  7.1.22                                            Next planned outreach: 02/09/21   Patient Goals/Self-Care Activities patient will: with the help of her son  - Review provided resources -Contact SW as needed prior to next scheduled call         Materials Provided: Yes: verbal and written education on in home primary care options  The patient verbalized understanding of instructions, educational materials, and care plan provided today and declined offer to receive copy of patient instructions, educational materials, and care plan.   Follow Up Plan: SW will follow up with patient by phone over the next 10 days  [name], BSW, CDP Social Worker, Certified Dementia Practitioner [place] [facility] [phone]

## 2021-02-01 NOTE — Chronic Care Management (AMB) (Signed)
Chronic Care Management    Social Work Note  02/01/2021 Name: Sara Fox MRN: [id number] DOB: 1941-05-05  Sara Fox is a 80 y.o. year old female who is a primary care patient of [name], [place]. The CCM team was consulted to assist the patient with chronic disease management and/or care coordination needs related to:  HTN, CHF, CKD III, and Mild Cognitive Impairment .   Engaged with patient by telephone for follow up visit in response to provider referral for social work chronic care management and care coordination services.   Consent to Services:  The patient was given information about Chronic Care Management services, agreed to services, and gave verbal consent prior to initiation of services.  Please see initial visit note for detailed documentation.   Patient agreed to services and consent obtained.   Assessment: Review of patient past medical history, allergies, medications, and health status, including review of relevant consultants reports was performed today as part of a comprehensive evaluation and provision of chronic care management and care coordination services.     SDOH (Social Determinants of Health) assessments and interventions performed:    Advanced Directives Status: Not addressed in this encounter.  CCM Care Plan  Allergies  Allergen Reactions   Buprenorphine Hcl Shortness Of Breath    Throat swelling/trouble breathing and lethargic   Morphine And Related Shortness Of Breath    Throat swelling/trouble breathing and lethargic   Celebrex [Celecoxib] Diarrhea and Swelling    Swelling of legs    Vioxx [Rofecoxib] Palpitations   Codeine Other (See Comments)    Bloated     Outpatient Encounter Medications as of 02/01/2021  Medication Sig   aspirin EC 81 MG tablet Take 81 mg by mouth daily as needed (headache).   budesonide-formoterol (SYMBICORT) 160-4.5 MCG/ACT inhaler TAKE 2 PUFFS BY MOUTH TWICE A DAY   Calcium Citrate-Vitamin D (CALCIUM CITRATE+D3  PETITES PO) Take 1 tablet by mouth daily.   cholecalciferol (VITAMIN D) 25 MCG (1000 UNIT) tablet Take 1,000 Units by mouth daily.   ciclopirox (PENLAC) 8 % solution Apply 1 application topically at bedtime. Apply over nail and surrounding skin. Apply daily over previous coat. After seven (7) days, file nail and continue cycle.   [employer] MAGNESIUM OXIDE 250 MG TABS TAKE 1 TABLET BY MOUTH WITH EVENING MEAL DAILY   donepezil (ARICEPT) 10 MG tablet Take 1 tablet (10 mg total) by mouth at bedtime.   ferrous sulfate 325 (65 FE) MG tablet Take 325 mg by mouth daily with breakfast.   furosemide (LASIX) 20 MG tablet Take 1 tablet (20 mg total) by mouth every other day.   gabapentin (NEURONTIN) 100 MG capsule Take 1 capsule (100 mg total) by mouth 2 (two) times daily.   ibuprofen (ADVIL) 200 MG tablet Take 400 mg by mouth every 6 (six) hours as needed (pain).   memantine (NAMENDA) 5 MG tablet Take 1 tablet (5 mg total) by mouth daily.   metoprolol tartrate (LOPRESSOR) 25 MG tablet Take 1 tablet (25 mg total) by mouth 2 (two) times daily.   Multiple Vitamin (MULTIVITAMIN WITH MINERALS) TABS tablet Take 1 tablet by mouth daily.   nitroGLYCERIN (NITROSTAT) 0.4 MG SL tablet PLACE ONE TABLET UNDER THE TONGUE EVERY 5 MINUTES AS NEEDED FOR CHEST PAIN.   oxybutynin (DITROPAN) 5 MG tablet Take 0.5 tablets (2.5 mg total) by mouth 2 (two) times daily.   pantoprazole (PROTONIX) 40 MG tablet Take 1 tablet (40 mg total) by mouth 2 (two) times daily.  polyethylene glycol (MIRALAX / GLYCOLAX) packet Take 17 g by mouth daily as needed for mild constipation.   Potassium Chloride ER 20 MEQ TBCR Take 2 tablets by mouth every morning.   QUEtiapine (SEROQUEL) 50 MG tablet TAKE 1 TABLET BY MOUTH EVERYDAY AT BEDTIME   rosuvastatin (CRESTOR) 40 MG tablet Take 1 tablet (40 mg total) by mouth daily.   traMADol (ULTRAM) 50 MG tablet TAKE 1 TABLET 3 TIMES A DAY AND CAN TAKE 1 EXTRA 20 DAYS/MONTH   No facility-administered encounter  medications on file as of 02/01/2021.    Patient Active Problem List   Diagnosis Date Noted   Fall (on)(from) incline, initial encounter 11/23/2020   Macrocytosis without anemia 11/22/2020   Unwitnessed fall 11/22/2020   Late onset Alzheimer's dementia with behavioral disturbance ([place]) 11/18/2020   Visual hallucination 11/18/2020   Sundowning 11/18/2020   Non-small cell carcinoma of right lung, stage 1 ([place]) 06/08/2020   Pulmonary nodule 1 cm or greater in diameter 05/21/2020   Atherosclerotic heart disease of native coronary artery with other forms of angina pectoris ([place]) 04/22/2020   Memory loss 02/18/2019   Vitamin B12 deficiency 10/23/2018   Stage 3 chronic kidney disease ([place]) 06/03/2018   Chronic anemia 06/03/2018   Hypercholesterolemia 06/03/2018   Malnutrition of moderate degree 07/12/2017   Hypoalbuminemia 07/11/2017   Elevated LFTs 07/11/2017   Snoring 12/21/2016   Peripheral musculoskeletal gait disorder 02/05/2015   Lumbar post-laminectomy syndrome 02/05/2015   CAD in native artery 10/16/2014   NSTEMI (non-ST elevated myocardial infarction) ([place]) 09/13/2014   Cardiomyopathy, ischemic 02/77/4128   Chronic systolic heart failure ([place])    Tobacco abuse 09/12/2014   Dyslipidemia 09/12/2014   Normocytic anemia 09/12/2014   Chronic diastolic heart failure, NYHA class 1 ([place]) 09/12/2014   Postlaminectomy syndrome, cervical region 08/01/2013   Chronic midline low back pain with left-sided sciatica 08/01/2013   Shoulder joint contracture 08/01/2013   HTN (hypertension) 03/03/2013   Abnormal genetic test 03/03/2013    Conditions to be addressed/monitored: CHF, HTN, CKD Stage III, and Dementia; Limited access to caregiver  Care Plan : Social Work [place]  Updates made by [name] since 02/01/2021 12:00 AM     Problem: Disease Progression      Goal: Disease Progression Managed   Start Date: 11/12/2020  Priority: High  Note:   Current Barriers:  Chronic  disease management support and education needs related to CHF, HTN, CKD Stage III, and Mild Cognitive Impairment   Increased Agitation  Social Worker Clinical Goal(s):  patient will work with SW to identify and address any acute and/or chronic care coordination needs related to the self health management of CHF, HTN, CKD Stage III, and Mild Cognitive Impairment   Patient and her son will work with SW to identify long term care options Patient, with the help of her son, will visit the ED to evaluate cause of increased agitation and combativeness Goal not met  SW Interventions:  Inter-disciplinary care team collaboration (see longitudinal plan of care) Collaboration with [name], FNP regarding development and update of comprehensive plan of care as evidenced by provider attestation and co-signature Successful outbound call placed to the patients son [name] to assess for care coordination needs Discussed [name] is still wanting to obtain Medicaid for the patient in order to have a caregiver in the home  [name] indicates he has tried for years to get the patient Medicaid and all she has is partial Medicaid - he is  unsure why she only has partial Determined the patients social security income in over the limit to receive Medicaid benefits Educated [name] on what partial Medicaid means and why the patient is only receiving partial benefit Discussed the patient is not eligible to receive PCS services in the home due to being over the income limit Advised of other options including privately hiring a caregiver or enrolling the patient in an adult day program Discussed [name] is unable to afford a private duty caregiver Discussed [name] does not want to enroll in an adult day program due to concerns with ability to get patient up and dressed in the morning Determined [name] is interested in a referral to an in home provider in order to alleviate barrier with getting patient  to the providers office Verbal education provided on services including [facility] calls and [facility] Noted patient will be seen in clinic tomorrow by primary provider [name] FNP - SW will print materials on in home providers for [name] to pick up and review Discussed [name] does not plan to have in home help until after he takes his dogs to the shelter on October 6th Reviewed continued opportunity to enroll the patient in [facility] [name] indicates he has not followed up with [facility] due to difficulty getting patient ready for a tour Discussed benefits of [facility] with [name] including transportation, primary care, medication, and caregiver support Determined at this time [name] wants to proceed with an in home primary care provider Advised [name] and in home provider will provide assistance with medical appointments in the home, not caregiver assistance Collaboration with [name] FNP to discuss interventions and plan Requested she provide the patient with materials on in home providers during office visit on 9/21  Patient Goals/Self-Care Activities patient will: with the help of her son  -  Review provided resources -Contact SW as needed prior to next scheduled call  Follow Up Plan:  SW will follow up with patient and her son over the next 10 days       Follow Up Plan: SW will follow up with patient by phone over the next 10 days      [name], Arita Miss, CDP Social Worker, Certified Dementia Practitioner [place] [facility] [phone]

## 2021-02-02 ENCOUNTER — Telehealth: Payer: Self-pay

## 2021-02-02 ENCOUNTER — Other Ambulatory Visit: Payer: Self-pay

## 2021-02-02 ENCOUNTER — Ambulatory Visit (INDEPENDENT_AMBULATORY_CARE_PROVIDER_SITE_OTHER): Payer: Medicare Other | Admitting: Nurse Practitioner

## 2021-02-02 VITALS — BP 130/72 | HR 76 | Temp 99.1°F | Ht 63.0 in | Wt 139.2 lb

## 2021-02-02 DIAGNOSIS — J449 Chronic obstructive pulmonary disease, unspecified: Secondary | ICD-10-CM

## 2021-02-02 DIAGNOSIS — F02818 Dementia in other diseases classified elsewhere, unspecified severity, with other behavioral disturbance: Secondary | ICD-10-CM

## 2021-02-02 DIAGNOSIS — R600 Localized edema: Secondary | ICD-10-CM

## 2021-02-02 DIAGNOSIS — Z2821 Immunization not carried out because of patient refusal: Secondary | ICD-10-CM

## 2021-02-02 DIAGNOSIS — F0281 Dementia in other diseases classified elsewhere with behavioral disturbance: Secondary | ICD-10-CM

## 2021-02-02 DIAGNOSIS — Z720 Tobacco use: Secondary | ICD-10-CM

## 2021-02-02 DIAGNOSIS — G301 Alzheimer's disease with late onset: Secondary | ICD-10-CM | POA: Diagnosis not present

## 2021-02-02 DIAGNOSIS — E7800 Pure hypercholesterolemia, unspecified: Secondary | ICD-10-CM

## 2021-02-02 DIAGNOSIS — N183 Chronic kidney disease, stage 3 unspecified: Secondary | ICD-10-CM

## 2021-02-02 DIAGNOSIS — I5032 Chronic diastolic (congestive) heart failure: Secondary | ICD-10-CM | POA: Diagnosis not present

## 2021-02-02 DIAGNOSIS — I129 Hypertensive chronic kidney disease with stage 1 through stage 4 chronic kidney disease, or unspecified chronic kidney disease: Secondary | ICD-10-CM

## 2021-02-02 MED ORDER — FUROSEMIDE 20 MG PO TABS: ORAL_TABLET | ORAL | 1 refills | Status: AC

## 2021-02-02 NOTE — Chronic Care Management (AMB) (Signed)
  Sara Fox son was reminded to have all medications, supplements and any blood glucose and blood pressure readings available for review with Orlando Penner, Pharm. D, at her telephone visit on 02-03-2021 at 2:45.   Questions: Have you had any recent office visit or specialist visit outside of [place]? Patient's son stated no  Are there any concerns you would like to discuss during your office visit? Patient's son stated no  Are you having any problems obtaining your medications? (Whether it pharmacy issues or cost) Patient's son stated no  If patient has any PAP medications ask if they are having any problems getting their PAP medication or refill? No PAP  Care Gaps: Zoster Vaccines- Shingrix- Overdue - never done PNA vac Low Risk Adult (2 of 2 - PPSV23)- Last completed: Aug 30, 2011 COVID-19 Vaccine (3 - Pfizer risk series)- Last completed: Sep 25, 2019 INFLUENZA VACCINE- Due December 13, 2020 Annual Wellness Exam scheduled for 12/23/2020  Star Rating Drug: Rosuvastatin 40 mg- Last filled 12-29-2020 [facility]  Any gaps in medications fill history? No  [place] Pharmacist Assistant [phone]

## 2021-02-02 NOTE — Patient Instructions (Addendum)

## 2021-02-02 NOTE — Progress Notes (Addendum)
I,Tianna Badgett,acting as a Education administrator for [employer], FNP.,have documented all relevant documentation on the behalf of [name], FNP,as directed by  [name], FNP while in the presence of [name], [place].  This visit occurred during the SARS-CoV-2 public health emergency.  Safety protocols were in place, including screening questions prior to the visit, additional usage of staff PPE, and extensive cleaning of exam room while observing appropriate contact time as indicated for disinfecting solutions.  Subjective:     Patient ID: Sara Fox , female    DOB: 01/08/1941 , 80 y.o.   MRN: [id number]   Chief Complaint  Patient presents with   Hyperlipidemia    HPI  Patient here for a f/u on her cholesterol & dementia. Her son reports she continues to contradict what he is saying. Today he told her shoes were dirty and she does not feel they are. He feels she is telling lies on him. He is getting rid of the dogs on October 6th. He is concerned about her legs and buttocks. He has a total of 4 dogs two adults and 2 puppies. Reports her legs are swollen. She is sleeping in a desk chair. He reports her blood pressure had dropped he gave her coffee and the blood pressure increased. He had to start at 8a this morning to get her ready for the day. He is going to check with her insurance for personal care.   Ms. Snodgrass states "when her son gets out in front of people he says things about her or will start cussing at her"  Hyperlipidemia This is a chronic problem. The problem is uncontrolled. She has no history of chronic renal disease. There are no known factors aggravating her hyperlipidemia. Pertinent negatives include no chest pain. There are no compliance problems.  Risk factors for coronary artery disease include a sedentary lifestyle.    Past Medical History:  Diagnosis Date   Acute kidney injury ([place])    Back pain    Cervical cancer (HCC)    CHF (congestive heart failure) (HCC)     Closed fracture of left distal radius 05/28/2017   COPD (chronic obstructive pulmonary disease) (HCC)    Coronary artery disease    s/p DES x2 to RCA 2016   GERD (gastroesophageal reflux disease)    History of radiation therapy 07/27/20-08/16/20   Right lung- SBRT- Dr. Gery Pray    Hyperlipidemia    Hypertension    Lower leg edema 12/21/2016   Mild cognitive impairment    Myocardial infarction [facility])    Neck pain    Rhabdomyolysis    Stomach problems      Family History  Problem Relation Age of Onset   Heart disease Mother        also HTN   Diabetes Mother    Colon cancer Mother    Heart disease Brother        deceased at 53   Hypertension Brother    Heart attack Brother    Heart disease Brother    Hypertension Brother      Current Outpatient Medications:    aspirin EC 81 MG tablet, Take 81 mg by mouth daily as needed (headache)., Disp: , Rfl:    budesonide-formoterol (SYMBICORT) 160-4.5 MCG/ACT inhaler, TAKE 2 PUFFS BY MOUTH TWICE A DAY, Disp: 30.6 each, Rfl: 1   Calcium Citrate-Vitamin D (CALCIUM CITRATE+D3 PETITES PO), Take 1 tablet by mouth daily., Disp: , Rfl:    cholecalciferol (VITAMIN D) 25 MCG (1000  UNIT) tablet, Take 1,000 Units by mouth daily., Disp: , Rfl:    ciclopirox (PENLAC) 8 % solution, Apply 1 application topically at bedtime. Apply over nail and surrounding skin. Apply daily over previous coat. After seven (7) days, file nail and continue cycle., Disp: , Rfl:    CVS MAGNESIUM OXIDE 250 MG TABS, TAKE 1 TABLET BY MOUTH WITH EVENING MEAL DAILY, Disp: 90 tablet, Rfl: 1   donepezil (ARICEPT) 10 MG tablet, Take 1 tablet (10 mg total) by mouth at bedtime., Disp: 90 tablet, Rfl: 1   ferrous sulfate 325 (65 FE) MG tablet, Take 325 mg by mouth daily with breakfast., Disp: , Rfl:    gabapentin (NEURONTIN) 100 MG capsule, Take 1 capsule (100 mg total) by mouth 2 (two) times daily., Disp: 180 capsule, Rfl: 1   ibuprofen (ADVIL) 200 MG tablet, Take 400 mg by mouth every 6  (six) hours as needed (pain)., Disp: , Rfl:    memantine (NAMENDA) 5 MG tablet, Take 1 tablet (5 mg total) by mouth daily., Disp: 90 tablet, Rfl: 1   metoprolol tartrate (LOPRESSOR) 25 MG tablet, Take 1 tablet (25 mg total) by mouth 2 (two) times daily., Disp: 180 tablet, Rfl: 0   Multiple Vitamin (MULTIVITAMIN WITH MINERALS) TABS tablet, Take 1 tablet by mouth daily., Disp: , Rfl:    nitroGLYCERIN (NITROSTAT) 0.4 MG SL tablet, PLACE ONE TABLET UNDER THE TONGUE EVERY 5 MINUTES AS NEEDED FOR CHEST PAIN., Disp: 25 tablet, Rfl: 1   oxybutynin (DITROPAN) 5 MG tablet, Take 0.5 tablets (2.5 mg total) by mouth 2 (two) times daily., Disp: 180 tablet, Rfl: 0   pantoprazole (PROTONIX) 40 MG tablet, Take 1 tablet (40 mg total) by mouth 2 (two) times daily., Disp: 180 tablet, Rfl: 1   polyethylene glycol (MIRALAX / GLYCOLAX) packet, Take 17 g by mouth daily as needed for mild constipation., Disp: , Rfl:    Potassium Chloride ER 20 MEQ TBCR, Take 2 tablets by mouth every morning., Disp: 180 tablet, Rfl: 0   QUEtiapine (SEROQUEL) 50 MG tablet, TAKE 1 TABLET BY MOUTH EVERYDAY AT BEDTIME, Disp: 90 tablet, Rfl: 1   rosuvastatin (CRESTOR) 40 MG tablet, Take 1 tablet (40 mg total) by mouth daily., Disp: 90 tablet, Rfl: 0   traMADol (ULTRAM) 50 MG tablet, TAKE 1 TABLET 3 TIMES A DAY AND CAN TAKE 1 EXTRA 20 DAYS/MONTH, Disp: 110 tablet, Rfl: 0   furosemide (LASIX) 20 MG tablet, Take 1 tablet by mouth Mon - Fri daily, Disp: 80 tablet, Rfl: 1   Allergies  Allergen Reactions   Buprenorphine Hcl Shortness Of Breath    Throat swelling/trouble breathing and lethargic   Morphine And Related Shortness Of Breath    Throat swelling/trouble breathing and lethargic   Celebrex [Celecoxib] Diarrhea and Swelling    Swelling of legs    Vioxx [Rofecoxib] Palpitations   Codeine Other (See Comments)    Bloated      Review of Systems  Constitutional: Negative.   Respiratory: Negative.    Cardiovascular: Negative.  Negative for  chest pain.  Gastrointestinal: Negative.   Neurological: Negative.   Psychiatric/Behavioral: Negative.      Today's Vitals   02/02/21 1109  BP: 130/72  Pulse: 76  Temp: 99.1 F (37.3 C)  TempSrc: Oral  Weight: 139 lb 3.2 oz (63.1 kg)  Height: 5\' 3"  (1.6 m)   Body mass index is 24.66 kg/m.  Wt Readings from Last 3 Encounters:  02/02/21 139 lb 3.2 oz (63.1 kg)  01/06/21 129 lb (58.5 kg)  12/15/20 129 lb (58.5 kg)     Objective:  Physical Exam Constitutional:      General: She is not in acute distress.    Appearance: Normal appearance.     Comments: Her clothes are not the best kept particularly her shoes  Cardiovascular:     Rate and Rhythm: Normal rate and regular rhythm.     Pulses: Normal pulses.     Heart sounds: Normal heart sounds. No murmur heard. Pulmonary:     Effort: Pulmonary effort is normal. No respiratory distress.     Breath sounds: Normal breath sounds. No wheezing.  Skin:    General: Skin is warm and dry.     Comments: Slight open area to upper mid section of buttocks  Neurological:     General: No focal deficit present.     Mental Status: She is alert and oriented to person, place, and time.     Cranial Nerves: No cranial nerve deficit.     Motor: No weakness.  Psychiatric:        Mood and Affect: Mood normal.        Behavior: Behavior normal.        Thought Content: Thought content normal.        Judgment: Judgment normal.        Assessment And Plan:     1. Hypercholesterolemia Comments: Stable, continue current medications, tolerating medications well - Lipid panel  2. Benign hypertension with CKD (chronic kidney disease) stage III (HCC) Comments: Blood pressure controlled, continue current medications - CMP14+EGFR  3. Chronic diastolic heart failure (HCC) Comments: Continue follow up with Cardiology She does have edema, encouraged to elevate feet - furosemide (LASIX) 20 MG tablet; Take 1 tablet by mouth Mon - Fri daily  Dispense: 80  tablet; Refill: 1  4. Obstructive chronic bronchitis without exacerbation ([place])  5. Lower extremity edema Comments: Trace edema, encouraged to elevate lower extremities Avoid dangling feet when sitting for long periods - furosemide (LASIX) 20 MG tablet; Take 1 tablet by mouth Mon - Fri daily  Dispense: 80 tablet; Refill: 1  6. Influenza vaccination declined Patient declined influenza vaccination at this time. Patient is aware that influenza vaccine prevents illness in 70% of healthy people, and reduces hospitalizations to 30-70% in elderly. This vaccine is recommended annually. Pt is willing to accept risk associated with refusing vaccination.  Addendum: 7. Late onset Alzheimer's dementia with behavioral disturbance [facility]) Her son is having more difficulty with bathing her and performing her ADL's, she is having periods of agitation. She is also having some urinary incontinence. She does have on shoes that are extremely dirty and has a body odor. She has a small open area to the top of her buttocks and he is encouraged to use a barrier cream. Will order Home Health for SN, HHA and SW who is encouraged to contact [place] who has been in contact with the son on resources.    Patient was given opportunity to ask questions. Patient verbalized understanding of the plan and was able to repeat key elements of the plan. All questions were answered to their satisfaction.  [name], FNP   I, [name], FNP, have reviewed all documentation for this visit. The documentation on 02/02/21 for the exam, diagnosis, procedures, and orders are all accurate and complete.   IF YOU HAVE BEEN REFERRED TO A SPECIALIST, IT MAY TAKE 1-2 WEEKS TO SCHEDULE/PROCESS THE REFERRAL. IF YOU HAVE  NOT HEARD FROM US/SPECIALIST IN TWO WEEKS, PLEASE GIVE [place] A CALL AT [phone].   THE PATIENT IS ENCOURAGED TO PRACTICE SOCIAL DISTANCING DUE TO THE COVID-19 PANDEMIC.

## 2021-02-03 ENCOUNTER — Ambulatory Visit: Payer: Medicare Other

## 2021-02-03 DIAGNOSIS — E7800 Pure hypercholesterolemia, unspecified: Secondary | ICD-10-CM

## 2021-02-03 DIAGNOSIS — I10 Essential (primary) hypertension: Secondary | ICD-10-CM

## 2021-02-03 LAB — CMP14+EGFR
ALT: 19 IU/L (ref 0–32)
AST: 24 IU/L (ref 0–40)
Albumin/Globulin Ratio: 1.5 (ref 1.2–2.2)
Albumin: 3.9 g/dL (ref 3.6–4.6)
Alkaline Phosphatase: 153 IU/L — ABNORMAL HIGH (ref 44–121)
BUN/Creatinine Ratio: 17 (ref 12–28)
BUN: 23 mg/dL (ref 8–27)
Bilirubin Total: <0.2 mg/dL (ref 0.0–1.2)
CO2: 22 mmol/L (ref 20–29)
Calcium: 9.4 mg/dL (ref 8.7–10.3)
Chloride: 109 mmol/L — ABNORMAL HIGH (ref 96–106)
Creatinine, Ser: 1.39 mg/dL — ABNORMAL HIGH (ref 0.57–1.00)
Globulin, Total: 2.6 g/dL (ref 1.5–4.5)
Glucose: 92 mg/dL (ref 65–99)
Potassium: 5 mmol/L (ref 3.5–5.2)
Sodium: 146 mmol/L — ABNORMAL HIGH (ref 134–144)
Total Protein: 6.5 g/dL (ref 6.0–8.5)
eGFR: 38 mL/min/{1.73_m2} — ABNORMAL LOW (ref >59)

## 2021-02-03 LAB — LIPID PANEL
Chol/HDL Ratio: 2.6 ratio (ref 0.0–4.4)
Cholesterol, Total: 164 mg/dL (ref 100–199)
HDL: 64 mg/dL (ref >39)
LDL Chol Calc (NIH): 87 mg/dL (ref 0–99)
Triglycerides: 67 mg/dL (ref 0–149)
VLDL Cholesterol Cal: 13 mg/dL (ref 5–40)

## 2021-02-03 NOTE — Progress Notes (Signed)
Chronic Care Management Pharmacy Note  02/10/2021 Name:  Sara Fox MRN:  [id number] DOB:  12-23-1940  Summary: Son reports that sometimes his mother does not listen to what he says.  Recommendations/Changes made from today's visit: Recommend patient receive COVID-19 booster   Plan: At this time son reports patient is not interested in taking any vaccinations. He is going to encourage [name] to take it.    Subjective: [name] is an 80 y.o. year old female who is a primary patient of [name], [place].  The CCM team was consulted for assistance with disease management and care coordination needs.    Engaged with patient by telephone for follow up visit in response to provider referral for pharmacy case management and/or care coordination services. He reports its a struggle for [name] to do anything. He reports that although she is older she is stubborn and he cant force her to do anything she does not want to do. She normally wakes up around 10 am. If she is too tired he has to make her get up around 1 pm or 2 pm.   Consent to Services:  The patient was given information about Chronic Care Management services, agreed to services, and gave verbal consent prior to initiation of services.  Please see initial visit note for detailed documentation.   Patient Care Team: [name], FNP as PCP - General (General Practice) Pixie Casino, MD as PCP - Cardiology (Cardiology) [name] as Social Worker [place], Claudette Stapler, RN as Case Manager Valrie Hart, RN as Oncology Nurse Navigator (Oncology)  Recent office visits: 02/02/2021  Recent consult visits: 02/02/2021 PCP OV 11/18/2020 Neurology [facility] visits: 11/22/2020 Hospital visit- fall  Objective:  Lab Results  Component Value Date   CREATININE 1.39 (H) 02/02/2021   BUN 23 02/02/2021   GFR 35.79 (L) 05/21/2020   GFRNONAA 39 (L) 11/24/2020   GFRAA 46 (L) 04/22/2020   NA 146 (H) 02/02/2021   K  5.0 02/02/2021   CALCIUM 9.4 02/02/2021   CO2 22 02/02/2021   GLUCOSE 92 02/02/2021    Lab Results  Component Value Date/Time   GFR 35.79 (L) 05/21/2020 10:07 AM   MICROALBUR 150 12/15/2020 03:52 PM   MICROALBUR 150 12/05/2018 11:52 AM    Last diabetic Eye exam: No results found for: HMDIABEYEEXA  Last diabetic Foot exam: No results found for: HMDIABFOOTEX   Lab Results  Component Value Date   CHOL 164 02/02/2021   HDL 64 02/02/2021   LDLCALC 87 02/02/2021   TRIG 67 02/02/2021   CHOLHDL 2.6 02/02/2021    Hepatic Function Latest Ref Rng & Units 02/02/2021 11/24/2020 11/22/2020  Total Protein 6.0 - 8.5 g/dL 6.5 - 6.8  Albumin 3.6 - 4.6 g/dL 3.9 2.5(L) 3.4(L)  AST 0 - 40 IU/L 24 - 22  ALT 0 - 32 IU/L 19 - 19  Alk Phosphatase 44 - 121 IU/L 153(H) - 103  Total Bilirubin 0.0 - 1.2 mg/dL <0.2 - 0.9  Bilirubin, Direct 0.1 - 0.5 mg/dL - - -    Lab Results  Component Value Date/Time   TSH 0.918 11/22/2020 07:05 PM   TSH 0.770 07/10/2019 11:21 AM   TSH 1.180 09/02/2018 01:11 PM    CBC Latest Ref Rng & Units 11/24/2020 11/23/2020 11/22/2020  WBC 4.0 - 10.5 K/uL 6.9 5.9 5.9  Hemoglobin 12.0 - 15.0 g/dL 9.6(L) 11.8(L) 11.4(L)  Hematocrit 36.0 - 46.0 % 31.6(L) 39.2 38.3  Platelets  150 - 400 K/uL 263 307 232    No results found for: VD25OH  Clinical ASCVD: Yes  The ASCVD Risk score (Arnett DK, et al., 2019) failed to calculate for the following reasons:   The 2019 ASCVD risk score is only valid for ages 20 to 34   The patient has a prior MI or stroke diagnosis    Depression screen [facility] 2/9 01/06/2021 07/05/2020 01/01/2020  Decreased Interest 0 0 0  Down, Depressed, Hopeless 0 0 0  PHQ - 2 Score 0 0 0  Altered sleeping - - -  Tired, decreased energy - - -  Change in appetite - - -  Feeling bad or failure about yourself  - - -  Trouble concentrating - - -  Moving slowly or fidgety/restless - - -  Suicidal thoughts - - -  PHQ-9 Score - - -  Difficult doing work/chores - - -   Some recent data might be hidden      Social History   Tobacco Use  Smoking Status Every Day   Packs/day: 0.50   Years: 60.00   Pack years: 30.00   Types: Cigarettes  Smokeless Tobacco Never  Tobacco Comments   currently smokes 1/2 ppd or less   BP Readings from Last 3 Encounters:  02/02/21 130/72  12/15/20 100/60  11/27/20 115/74   Pulse Readings from Last 3 Encounters:  02/02/21 76  12/15/20 91  11/27/20 65   Wt Readings from Last 3 Encounters:  02/02/21 139 lb 3.2 oz (63.1 kg)  01/06/21 129 lb (58.5 kg)  12/15/20 129 lb (58.5 kg)   BMI Readings from Last 3 Encounters:  02/02/21 24.66 kg/m  01/06/21 22.85 kg/m  12/15/20 21.47 kg/m    Assessment/Interventions: Review of patient past medical history, allergies, medications, health status, including review of consultants reports, laboratory and other test data, was performed as part of comprehensive evaluation and provision of chronic care management services.   SDOH:  (Social Determinants of Health) assessments and interventions performed: No  SDOH Screenings   Alcohol Screen: Not on file  Depression (PHQ2-9): Low Risk    PHQ-2 Score: 0  Financial Resource Strain: Low Risk    Difficulty of Paying Living Expenses: Not hard at all  Food Insecurity: No Food Insecurity   Worried About Charity fundraiser in the Last Year: Never true   Ran Out of Food in the Last Year: Never true  Housing: Not on file  Physical Activity: Inactive   Days of Exercise per Week: 0 days   Minutes of Exercise per Session: 0 min  Social Connections: Not on file  Stress: Stress Concern Present   Feeling of Stress : Rather much  Tobacco Use: High Risk   Smoking Tobacco Use: Every Day   Smokeless Tobacco Use: Never  Transportation Needs: No Transportation Needs   Lack of Transportation (Medical): No   Lack of Transportation (Non-Medical): No    CCM Care Plan  Allergies  Allergen Reactions   Buprenorphine Hcl Shortness Of  Breath    Throat swelling/trouble breathing and lethargic   Morphine And Related Shortness Of Breath    Throat swelling/trouble breathing and lethargic   Celebrex [Celecoxib] Diarrhea and Swelling    Swelling of legs    Vioxx [Rofecoxib] Palpitations   Codeine Other (See Comments)    Bloated     Medications Reviewed Today     Reviewed by Mayford Knife, RPH (Pharmacist) on 02/03/21 at 1452  Med List Status: <None>  Medication Order Taking? Sig Documenting Provider Last Dose Status Informant  aspirin EC 81 MG tablet [id number] No Take 81 mg by mouth daily as needed (headache). [provider] Taking Active            Med Note Caren Macadam Nov 01, 2020  9:52 AM)    budesonide-formoterol [facility]) 160-4.5 MCG/ACT inhaler [id number] No TAKE 2 PUFFS BY MOUTH TWICE A DAY [name], FNP Taking Active   Calcium Citrate-Vitamin D (CALCIUM CITRATE+D3 PETITES PO) [id number] No Take 1 tablet by mouth daily. [provider] Taking Active   cholecalciferol (VITAMIN D) 25 MCG (1000 UNIT) tablet [id number] No Take 1,000 Units by mouth daily. [provider] Taking Active   ciclopirox (PENLAC) 8 % solution [id number] No Apply 1 application topically at bedtime. Apply over nail and surrounding skin. Apply daily over previous coat. After seven (7) days, file nail and continue cycle. Collene Gobble, MD Taking Active   [facility] MAGNESIUM OXIDE 250 MG TABS [id number] No TAKE 1 TABLET BY MOUTH WITH EVENING MEAL DAILY [name], FNP Taking Active   donepezil (ARICEPT) 10 MG tablet [id number] No Take 1 tablet (10 mg total) by mouth at bedtime. [name], FNP Taking Active   ferrous sulfate 325 (65 FE) MG tablet [id number] No Take 325 mg by mouth daily with breakfast. [provider] Taking Active   furosemide (LASIX) 20 MG tablet [id number]  Take 1 tablet by mouth Mon - Fri daily [name], FNP  Active   gabapentin (NEURONTIN) 100 MG capsule [id number] No Take 1  capsule (100 mg total) by mouth 2 (two) times daily. [name], FNP Taking Active   ibuprofen (ADVIL) 200 MG tablet [id number] No Take 400 mg by mouth every 6 (six) hours as needed (pain). [provider] Taking Active   memantine (NAMENDA) 5 MG tablet [id number] No Take 1 tablet (5 mg total) by mouth daily. [name], FNP Taking Active   metoprolol tartrate (LOPRESSOR) 25 MG tablet [id number] No Take 1 tablet (25 mg total) by mouth 2 (two) times daily. Pixie Casino, MD Taking Active   Multiple Vitamin (MULTIVITAMIN WITH MINERALS) TABS tablet [id number] No Take 1 tablet by mouth daily. [provider] Taking Active   nitroGLYCERIN (NITROSTAT) 0.4 MG SL tablet [id number] No PLACE ONE TABLET UNDER THE TONGUE EVERY 5 MINUTES AS NEEDED FOR CHEST PAIN. Pixie Casino, MD Taking Active   oxybutynin (DITROPAN) 5 MG tablet [id number] No Take 0.5 tablets (2.5 mg total) by mouth 2 (two) times daily. [name], FNP Taking Active   pantoprazole (PROTONIX) 40 MG tablet [id number] No Take 1 tablet (40 mg total) by mouth 2 (two) times daily. [name], FNP Taking Active   polyethylene glycol [facility] / GLYCOLAX) packet [id number] No Take 17 g by mouth daily as needed for mild constipation. [provider] Taking Active   Potassium Chloride ER 20 MEQ TBCR [id number] No Take 2 tablets by mouth every morning. Pixie Casino, MD Taking Active   QUEtiapine (SEROQUEL) 50 MG tablet [id number] No TAKE 1 TABLET BY MOUTH EVERYDAY AT BEDTIME [name], FNP Taking Active   rosuvastatin (CRESTOR) 40 MG tablet [id number] No Take 1 tablet (40 mg total) by mouth daily. Pixie Casino, MD Taking Active   traMADol (ULTRAM) 50 MG tablet [id number] No TAKE 1 TABLET 3 TIMES A DAY AND CAN TAKE 1 EXTRA 20 DAYS/MONTH [name], FNP Taking Active  Patient Active Problem List   Diagnosis Date Noted   Fall (on)(from) incline, initial encounter 11/23/2020   Macrocytosis  without anemia 11/22/2020   Unwitnessed fall 11/22/2020   Late onset Alzheimer's dementia with behavioral disturbance ([place]) 11/18/2020   Visual hallucination 11/18/2020   Sundowning 11/18/2020   Non-small cell carcinoma of right lung, stage 1 ([place]) 06/08/2020   Pulmonary nodule 1 cm or greater in diameter 05/21/2020   Atherosclerotic heart disease of native coronary artery with other forms of angina pectoris ([place]) 04/22/2020   Memory loss 02/18/2019   Vitamin B12 deficiency 10/23/2018   Stage 3 chronic kidney disease ([place]) 06/03/2018   Chronic anemia 06/03/2018   Hypercholesterolemia 06/03/2018   Malnutrition of moderate degree 07/12/2017   Hypoalbuminemia 07/11/2017   Elevated LFTs 07/11/2017   Snoring 12/21/2016   Peripheral musculoskeletal gait disorder 02/05/2015   Lumbar post-laminectomy syndrome 02/05/2015   CAD in native artery 10/16/2014   NSTEMI (non-ST elevated myocardial infarction) ([place]) 09/13/2014   Cardiomyopathy, ischemic 99/37/1696   Chronic systolic heart failure ([place])    Tobacco abuse 09/12/2014   Dyslipidemia 09/12/2014   Normocytic anemia 09/12/2014   Chronic diastolic heart failure, NYHA class 1 ([place]) 09/12/2014   Postlaminectomy syndrome, cervical region 08/01/2013   Chronic midline low back pain with left-sided sciatica 08/01/2013   Shoulder joint contracture 08/01/2013   HTN (hypertension) 03/03/2013   Abnormal genetic test 03/03/2013    Immunization History  Administered Date(s) Administered   PFIZER(Purple Top)SARS-COV-2 Vaccination 08/30/2019, 09/25/2019    Conditions to be addressed/monitored:  Hypertension and Hyperlipidemia  Care Plan : [place]  Updates made by Mayford Knife, [place] since 02/10/2021 12:00 AM     Problem: HTN, HLD   Priority: High     Long-Range Goal: Disease Management   Recent Progress: On track  Note:   Current Barriers:  Unable to independently monitor therapeutic efficacy Unable to self administer  medications as prescribed Does not adhere to prescribed medication regimen  Pharmacist Clinical Goal(s):  Patient will achieve adherence to monitoring guidelines and medication adherence to achieve therapeutic efficacy through collaboration with PharmD and provider.   Interventions: 1:1 collaboration with [name], FNP regarding development and update of comprehensive plan of care as evidenced by provider attestation and co-signature Inter-disciplinary care team collaboration (see longitudinal plan of care) Comprehensive medication review performed; medication list updated in electronic medical record  Hypertension (BP goal <140/90) -Controlled -Current treatment: Metoprolol tartrate 25 mg tablet twice daily  -Current home readings: currently patient nor caregiver, son are checking patients BP at home  -Current dietary habits: eats food made by her son -Current exercise habits: patient is not currently exercising  -Denies hypotensive/hypertensive symptoms -Educated on BP goals and benefits of medications for prevention of heart attack, stroke and kidney damage; Daily salt intake goal < 2300 mg; -Counseled to monitor BP at home at least twice per week, document, and provide log at future appointments -Recommended to continue current medication  Hyperlipidemia: (LDL goal < 70) -Uncontrolled -Current treatment: Aspirin EC 81 mg tablet daily Metoprolol tartrate 64m tablet twice per day Rosuvastatin 40 mg tablet once per day Nitroglycerin 0.4 mg SL place one tablet under the tongue every 5 minutes as needed for chest pain  Furosemide 20 mg tablet - taking 1 tablet by mouth Monday through Friday daily.  -Current dietary patterns: patient is not eating any fried or fatty foods. Mostly grilled foods.  -Current exercise habits: son reports Ms. SLevenis taking care of four  dogs -Educated on Benefits of statin for ASCVD risk reduction; Importance of limiting foods high in  cholesterol; -Recommended to continue current medication  Patient Goals/Self-Care Activities Patient will:  - take medications as prescribed  Follow Up Plan: The patient has been provided with contact information for the care management team and has been advised to call with any health related questions or concerns.        Medication Assistance: None required.  Patient affirms current coverage meets needs.  Compliance/Adherence/Medication fill history: Care Gaps: Shingrix Vaccine  COVID-19 Vaccine  Influenza Vaccine   Star-Rating Drugs: Rosuvastatin 40 mg tablet   Patient's preferred pharmacy is:  [id number] [place] [facility] [address] [place]Phone: [phone]: [phone] [facility] [place] [place]: [phone]: [phone] Uses pill box? Yes Pt endorses 85% compliance  We discussed: Benefits of medication synchronization, packaging and delivery as well as enhanced pharmacist oversight with [facility]. Patient decided to: Continue current medication management strategy  Care Plan and Follow Up Patient Decision:  Patient agrees to Care Plan and Follow-up.  Plan: The patient has been provided with contact information for the care management team and has been advised to call with any health related questions or concerns.   VOrlando Penner PharmD Clinical Pharmacist [facility] [phone]

## 2021-02-07 ENCOUNTER — Telehealth: Payer: Self-pay

## 2021-02-07 ENCOUNTER — Telehealth: Payer: Medicare Other

## 2021-02-07 NOTE — Telephone Encounter (Signed)
  Care Management   Follow Up Note   02/07/2021 Name: Sara Fox MRN: [id number] DOB: 05-02-1941   Referred by: [name], FNP Reason for referral : Chronic Care Management (RN CM Follow up call )   An unsuccessful telephone outreach was attempted today. The patient was referred to the case management team for assistance with care management and care coordination.   Follow Up Plan: A HIPPA compliant phone message was left for the patient providing contact information and requesting a return call.   Barb Merino, RN, BSN, CCM Care Management Coordinator [place] [facility]  Direct Phone: [phone]

## 2021-02-09 ENCOUNTER — Ambulatory Visit: Payer: Medicare Other

## 2021-02-09 DIAGNOSIS — I10 Essential (primary) hypertension: Secondary | ICD-10-CM

## 2021-02-09 DIAGNOSIS — F02818 Dementia in other diseases classified elsewhere, unspecified severity, with other behavioral disturbance: Secondary | ICD-10-CM

## 2021-02-09 DIAGNOSIS — G301 Alzheimer's disease with late onset: Secondary | ICD-10-CM

## 2021-02-09 DIAGNOSIS — I5032 Chronic diastolic (congestive) heart failure: Secondary | ICD-10-CM

## 2021-02-09 DIAGNOSIS — N1831 Chronic kidney disease, stage 3a: Secondary | ICD-10-CM

## 2021-02-09 DIAGNOSIS — F0281 Dementia in other diseases classified elsewhere with behavioral disturbance: Secondary | ICD-10-CM

## 2021-02-09 NOTE — Chronic Care Management (AMB) (Signed)
Chronic Care Management    Social Work Note  02/09/2021 Name: Sara Fox MRN: [id number] DOB: [birth year]  Sara Fox is a 80 y.o. year old female who is a primary care patient of Sara Fox, [place]. The CCM team was consulted to assist the patient with chronic disease management and/or care coordination needs related to:  HTN, CHF, CKD III, and Late Onset Alzheimers .   Engaged with patients son Sara Fox by phone  for follow up visit in response to provider referral for social work chronic care management and care coordination services.   Consent to Services:  The patient was given information about Chronic Care Management services, agreed to services, and gave verbal consent prior to initiation of services.  Please see initial visit note for detailed documentation.   Patient agreed to services and consent obtained.   Assessment: Review of patient past medical history, allergies, medications, and health status, including review of relevant consultants reports was performed today as part of a comprehensive evaluation and provision of chronic care management and care coordination services.     SDOH (Social Determinants of Health) assessments and interventions performed:    Advanced Directives Status: Not addressed in this encounter.  CCM Care Plan  Allergies  Allergen Reactions   Buprenorphine Hcl Shortness Of Breath    Throat swelling/trouble breathing and lethargic   Morphine And Related Shortness Of Breath    Throat swelling/trouble breathing and lethargic   Celebrex [Celecoxib] Diarrhea and Swelling    Swelling of legs    Vioxx [Rofecoxib] Palpitations   Codeine Other (See Comments)    Bloated     Outpatient Encounter Medications as of 02/09/2021  Medication Sig   aspirin EC 81 MG tablet Take 81 mg by mouth daily as needed (headache).   budesonide-formoterol (SYMBICORT) 160-4.5 MCG/ACT inhaler TAKE 2 PUFFS BY MOUTH TWICE A DAY   Calcium Citrate-Vitamin D (CALCIUM  CITRATE+D3 PETITES PO) Take 1 tablet by mouth daily.   cholecalciferol (VITAMIN D) 25 MCG (1000 UNIT) tablet Take 1,000 Units by mouth daily.   ciclopirox (PENLAC) 8 % solution Apply 1 application topically at bedtime. Apply over nail and surrounding skin. Apply daily over previous coat. After seven (7) days, file nail and continue cycle.   CVS MAGNESIUM OXIDE 250 MG TABS TAKE 1 TABLET BY MOUTH WITH EVENING MEAL DAILY   donepezil (ARICEPT) 10 MG tablet Take 1 tablet (10 mg total) by mouth at bedtime.   ferrous sulfate 325 (65 FE) MG tablet Take 325 mg by mouth daily with breakfast.   furosemide (LASIX) 20 MG tablet Take 1 tablet by mouth Mon - Fri daily   gabapentin (NEURONTIN) 100 MG capsule Take 1 capsule (100 mg total) by mouth 2 (two) times daily.   ibuprofen (ADVIL) 200 MG tablet Take 400 mg by mouth every 6 (six) hours as needed (pain).   memantine (NAMENDA) 5 MG tablet Take 1 tablet (5 mg total) by mouth daily.   metoprolol tartrate (LOPRESSOR) 25 MG tablet Take 1 tablet (25 mg total) by mouth 2 (two) times daily.   Multiple Vitamin (MULTIVITAMIN WITH MINERALS) TABS tablet Take 1 tablet by mouth daily.   nitroGLYCERIN (NITROSTAT) 0.4 MG SL tablet PLACE ONE TABLET UNDER THE TONGUE EVERY 5 MINUTES AS NEEDED FOR CHEST PAIN.   oxybutynin (DITROPAN) 5 MG tablet Take 0.5 tablets (2.5 mg total) by mouth 2 (two) times daily.   pantoprazole (PROTONIX) 40 MG tablet Take 1 tablet (40 mg total) by mouth 2 (two)  times daily.   polyethylene glycol (MIRALAX / GLYCOLAX) packet Take 17 g by mouth daily as needed for mild constipation.   Potassium Chloride ER 20 MEQ TBCR Take 2 tablets by mouth every morning.   QUEtiapine (SEROQUEL) 50 MG tablet TAKE 1 TABLET BY MOUTH EVERYDAY AT BEDTIME   rosuvastatin (CRESTOR) 40 MG tablet Take 1 tablet (40 mg total) by mouth daily.   traMADol (ULTRAM) 50 MG tablet TAKE 1 TABLET 3 TIMES A DAY AND CAN TAKE 1 EXTRA 20 DAYS/MONTH   No facility-administered encounter  medications on file as of 02/09/2021.    Patient Active Problem List   Diagnosis Date Noted   Fall (on)(from) incline, initial encounter 11/23/2020   Macrocytosis without anemia 11/22/2020   Unwitnessed fall 11/22/2020   Late onset Alzheimer's dementia with behavioral disturbance ([place]) 11/18/2020   Visual hallucination 11/18/2020   Sundowning 11/18/2020   Non-small cell carcinoma of right lung, stage 1 ([place]) 06/08/2020   Pulmonary nodule 1 cm or greater in diameter 05/21/2020   Atherosclerotic heart disease of native coronary artery with other forms of angina pectoris ([place]) 04/22/2020   Memory loss 02/18/2019   Vitamin B12 deficiency 10/23/2018   Stage 3 chronic kidney disease ([place]) 06/03/2018   Chronic anemia 06/03/2018   Hypercholesterolemia 06/03/2018   Malnutrition of moderate degree 07/12/2017   Hypoalbuminemia 07/11/2017   Elevated LFTs 07/11/2017   Snoring 12/21/2016   Peripheral musculoskeletal gait disorder 02/05/2015   Lumbar post-laminectomy syndrome 02/05/2015   CAD in native artery 10/16/2014   NSTEMI (non-ST elevated myocardial infarction) ([place]) 09/13/2014   Cardiomyopathy, ischemic 85/46/2703   Chronic systolic heart failure ([place])    Tobacco abuse 09/12/2014   Dyslipidemia 09/12/2014   Normocytic anemia 09/12/2014   Chronic diastolic heart failure, NYHA class 1 ([place]) 09/12/2014   Postlaminectomy syndrome, cervical region 08/01/2013   Chronic midline low back pain with left-sided sciatica 08/01/2013   Shoulder joint contracture 08/01/2013   HTN (hypertension) 03/03/2013   Abnormal genetic test 03/03/2013    Conditions to be addressed/monitored: CHF, HTN, CKD Stage III, and Dementia  Care Plan : Social Work [place]  Updates made by [name] since 02/09/2021 12:00 AM     Problem: Disease Progression      Goal: Disease Progression Managed   Start Date: 11/12/2020  Priority: High  Note:   Current Barriers:  Chronic disease management support  and education needs related to CHF, HTN, CKD Stage III, and Mild Cognitive Impairment   Increased Agitation  Social Worker Clinical Goal(s):  patient will work with SW to identify and address any acute and/or chronic care coordination needs related to the self health management of CHF, HTN, CKD Stage III, and Mild Cognitive Impairment   Patient and her son will work with SW to identify long term care options Patient, with the help of her son, will visit the ED to evaluate cause of increased agitation and combativeness Goal not met  SW Interventions:  Inter-disciplinary care team collaboration (see longitudinal plan of care) Collaboration with Sara Brine, FNP regarding development and update of comprehensive plan of care as evidenced by provider attestation and co-signature Successful outbound call placed to the patients son Sara Fox to assess goal progression Confirmed [name] did receive information on options for in home primary care providers Assessed for barriers with moving forward with in home pcp transition Discussed [name] has spoke with the animal shelter who informed him they do not pick up surrendered animals from homes therefore  no one will pick up Mr. Pratts dogs on 10/6  Determined [name] had previously spoken with an employee of the shelter who picks up stray dogs and was informed they would come pick up his dogs. However, [name] now reports he will have to transport his dogs to the shelter to surrender them Assessed for plans to surrender pets, [name] stated "I will find a way to get them there" Determined [name] still plans to switch the patient to an in home physician for ease of care Reviewed with [name] that an in home primary care provider is there to assist with medical appointments and not ADL's - [name] stated understanding Discussed plans for SW to contact [name] over the next month to assess ability to access an in home provider for the  patient Collaboration with Sara Brine FNP and [place] to communicate barriers with surrendering pets and plan to move forward with an in home primary care provider once pets are removed from the home  Patient Goals/Self-Care Activities patient will: with the help of her son  -  Establish care with a primary care provider -Contact SW as needed prior to next scheduled call  Follow Up Plan:  SW will follow up with patient and her son over the next 30 days       Follow Up Plan: SW will follow up with patient by phone over the next month      [name], BSW, CDP Social Worker, Certified Dementia Practitioner [place] [facility] [place] [facility] [phone]

## 2021-02-09 NOTE — Patient Instructions (Signed)
Social Worker Visit Information  Goals we discussed today:   Goals Addressed             This Visit's Progress    Disease Progression Managed       Timeframe:  Short-Term Goal Priority:  High Start Date:  7.1.22                                            Next planned outreach: 03/02/21   Patient Goals/Self-Care Activities patient will: with the help of her son  - Establish care with a primary care provider -Contact SW as needed prior to next scheduled call         Materials Provided: Verbal education about the role of an in home primary care provider provided by phone  The patient verbalized understanding of instructions, educational materials, and care plan provided today and declined offer to receive copy of patient instructions, educational materials, and care plan.   Follow Up Plan: SW will follow up with patient by phone over the next month   [name], BSW, CDP Social Worker, Certified Dementia Practitioner [place] [facility] [place] [facility] [phone]

## 2021-02-10 NOTE — Patient Instructions (Addendum)
Visit Information It was great speaking with you today!  Please let me know if you have any questions about our visit.   Goals Addressed             This Visit's Progress    Manage My Medicine       Timeframe:  Long-Range Goal Priority:  High Start Date:                             Expected End Date:                       Follow Up Date 03/22/2021  In Progress:    - call for medicine refill 2 or 3 days before it runs out - call if I am sick and can't take my medicine - use a pillbox to sort medicine    Why is this important?   These steps will help you keep on track with your medicines.   Notes:  Please call if you have any questions.         Patient Care Plan: CCM Pharmacy Care Plan     Problem Identified: HTN, HLD   Priority: High     Long-Range Goal: Disease Management   Recent Progress: On track  Note:   Current Barriers:  Unable to independently monitor therapeutic efficacy Unable to self administer medications as prescribed Does not adhere to prescribed medication regimen  Pharmacist Clinical Goal(s):  Patient will achieve adherence to monitoring guidelines and medication adherence to achieve therapeutic efficacy through collaboration with PharmD and provider.   Interventions: 1:1 collaboration with Sara Brine, FNP regarding development and update of comprehensive plan of care as evidenced by provider attestation and co-signature Inter-disciplinary care team collaboration (see longitudinal plan of care) Comprehensive medication review performed; medication list updated in electronic medical record  Hypertension (BP goal <140/90) -Controlled -Current treatment: Metoprolol tartrate 25 mg tablet twice daily  -Current home readings: currently patient nor caregiver, son are checking patients BP at home  -Current dietary habits: eats food made by her son -Current exercise habits: patient is not currently exercising  -Denies hypotensive/hypertensive  symptoms -Educated on BP goals and benefits of medications for prevention of heart attack, stroke and kidney damage; Daily salt intake goal < 2300 mg; -Counseled to monitor BP at home at least twice per week, document, and provide log at future appointments -Recommended to continue current medication  Hyperlipidemia: (LDL goal < 70) -Uncontrolled -Current treatment: Aspirin EC 81 mg tablet daily Metoprolol tartrate 25mg  tablet twice per day Rosuvastatin 40 mg tablet once per day Nitroglycerin 0.4 mg SL place one tablet under the tongue every 5 minutes as needed for chest pain  Furosemide 20 mg tablet - taking 1 tablet by mouth Monday through Friday daily.  -Current dietary patterns: patient is not eating any fried or fatty foods. Mostly grilled foods.  -Current exercise habits: son reports Sara Fox is taking care of four dogs -Educated on Benefits of statin for ASCVD risk reduction; Importance of limiting foods high in cholesterol; -Recommended to continue current medication  Patient Goals/Self-Care Activities Patient will:  - take medications as prescribed  Follow Up Plan: The patient has been provided with contact information for the care management team and has been advised to call with any health related questions or concerns.         Patient agreed to services and verbal consent obtained.   The patient  verbalized understanding of instructions, educational materials, and care plan provided today and agreed to receive a mailed copy of patient instructions, educational materials, and care plan.   Sara Fox, PharmD Clinical Pharmacist [facility] [phone]

## 2021-02-11 ENCOUNTER — Encounter: Payer: Self-pay | Admitting: Nurse Practitioner

## 2021-02-11 DIAGNOSIS — N1831 Chronic kidney disease, stage 3a: Secondary | ICD-10-CM

## 2021-02-11 DIAGNOSIS — G301 Alzheimer's disease with late onset: Secondary | ICD-10-CM | POA: Diagnosis not present

## 2021-02-11 DIAGNOSIS — E7800 Pure hypercholesterolemia, unspecified: Secondary | ICD-10-CM | POA: Diagnosis not present

## 2021-02-11 DIAGNOSIS — C3491 Malignant neoplasm of unspecified part of right bronchus or lung: Secondary | ICD-10-CM

## 2021-02-11 DIAGNOSIS — F0281 Dementia in other diseases classified elsewhere with behavioral disturbance: Secondary | ICD-10-CM

## 2021-02-11 DIAGNOSIS — I5032 Chronic diastolic (congestive) heart failure: Secondary | ICD-10-CM | POA: Diagnosis not present

## 2021-02-11 DIAGNOSIS — I25118 Atherosclerotic heart disease of native coronary artery with other forms of angina pectoris: Secondary | ICD-10-CM

## 2021-02-11 DIAGNOSIS — I10 Essential (primary) hypertension: Secondary | ICD-10-CM

## 2021-02-11 DIAGNOSIS — I5022 Chronic systolic (congestive) heart failure: Secondary | ICD-10-CM | POA: Diagnosis not present

## 2021-02-14 ENCOUNTER — Ambulatory Visit (INDEPENDENT_AMBULATORY_CARE_PROVIDER_SITE_OTHER): Payer: Medicare Other

## 2021-02-14 ENCOUNTER — Other Ambulatory Visit: Payer: Self-pay | Admitting: Nurse Practitioner

## 2021-02-14 ENCOUNTER — Ambulatory Visit: Payer: Self-pay

## 2021-02-14 ENCOUNTER — Telehealth: Payer: Medicare Other

## 2021-02-14 DIAGNOSIS — G301 Alzheimer's disease with late onset: Secondary | ICD-10-CM

## 2021-02-14 DIAGNOSIS — N1831 Chronic kidney disease, stage 3a: Secondary | ICD-10-CM

## 2021-02-14 DIAGNOSIS — I5032 Chronic diastolic (congestive) heart failure: Secondary | ICD-10-CM

## 2021-02-14 DIAGNOSIS — F02818 Dementia in other diseases classified elsewhere, unspecified severity, with other behavioral disturbance: Secondary | ICD-10-CM

## 2021-02-14 DIAGNOSIS — G3184 Mild cognitive impairment, so stated: Secondary | ICD-10-CM

## 2021-02-14 DIAGNOSIS — I10 Essential (primary) hypertension: Secondary | ICD-10-CM

## 2021-02-14 DIAGNOSIS — I25118 Atherosclerotic heart disease of native coronary artery with other forms of angina pectoris: Secondary | ICD-10-CM

## 2021-02-14 DIAGNOSIS — C3491 Malignant neoplasm of unspecified part of right bronchus or lung: Secondary | ICD-10-CM

## 2021-02-14 NOTE — Patient Instructions (Signed)
Social Worker Visit Information  Goals we discussed today:   Goals Addressed             This Visit's Progress    Disease Progression Managed       Timeframe:  Short-Term Goal Priority:  High Start Date:  7.1.22                                            Next planned outreach: 02/23/21  Patient Goals/Self-Care Activities patient will: with the help of her son  - Engage with home health -Call 911 for assistance with increased agitation -Complete and submit Medicaid application -Contact SW as needed prior to next scheduled call         Materials Provided: Yes: provided verbal information on placement process. Mailed a Medicaid application to the patients home.  The patient verbalized understanding of instructions, educational materials, and care plan provided today and declined offer to receive copy of patient instructions, educational materials, and care plan.   Follow Up Plan: SW will follow up with patient by phone over the next 14 days   [name], BSW, CDP Social Worker, Certified Dementia Practitioner [place] [facility] [place] [facility] [phone]

## 2021-02-14 NOTE — Progress Notes (Signed)
Received message from [place] MA indicating the patients son is upset because he does not have any help with his mother. Myself and Tillie Rung SW [facility]) has provided him with options for PACE which would be an all inclusive medical home, I most recently had a conversation with him at her last visit to try to do a tour of the facility and have them to come out to make an assessment for admission. He was also given information on Doctors making house calls to help with doctor visits.   I will make a referral to [facility] for nursing, HHA, and SW. He has shared he has dogs within the home he was taking to the kennel this week and would call Doctors making house calls. I have also advised Chartered loss adjuster can have the patient to come in for a lab visit urinalysis to make sure she does not have a urinary tract infection because her son stated "he had a bad weekend with her and she was trying to get out of the house".

## 2021-02-14 NOTE — Chronic Care Management (AMB) (Signed)
Chronic Care Management    Social Work Note  02/14/2021 Name: Sara Fox MRN: [id number] DOB: 1941/03/13  Sara Fox is a 80 y.o. year old female who is a primary care patient of [name], [place]. The CCM team was consulted to assist the patient with chronic disease management and/or care coordination needs related to:  Late onset Alzheimer's Disease, HTN, CHF, CKD III .   Engaged with patients son Sara Fox by phone  for follow up visit in response to provider referral for social work chronic care management and care coordination services.   Consent to Services:  The patient was given information about Chronic Care Management services, agreed to services, and gave verbal consent prior to initiation of services.  Please see initial visit note for detailed documentation.   Patient agreed to services and consent obtained.   Assessment: Review of patient past medical history, allergies, medications, and health status, including review of relevant consultants reports was performed today as part of a comprehensive evaluation and provision of chronic care management and care coordination services.     SDOH (Social Determinants of Health) assessments and interventions performed:    Advanced Directives Status: Not addressed in this encounter.  CCM Care Plan  Allergies  Allergen Reactions   Buprenorphine Hcl Shortness Of Breath    Throat swelling/trouble breathing and lethargic   Morphine And Related Shortness Of Breath    Throat swelling/trouble breathing and lethargic   Celebrex [Celecoxib] Diarrhea and Swelling    Swelling of legs    Vioxx [Rofecoxib] Palpitations   Codeine Other (See Comments)    Bloated     Outpatient Encounter Medications as of 02/14/2021  Medication Sig   aspirin EC 81 MG tablet Take 81 mg by mouth daily as needed (headache).   budesonide-formoterol (SYMBICORT) 160-4.5 MCG/ACT inhaler TAKE 2 PUFFS BY MOUTH TWICE A DAY   Calcium Citrate-Vitamin D  (CALCIUM CITRATE+D3 PETITES PO) Take 1 tablet by mouth daily.   cholecalciferol (VITAMIN D) 25 MCG (1000 UNIT) tablet Take 1,000 Units by mouth daily.   ciclopirox (PENLAC) 8 % solution Apply 1 application topically at bedtime. Apply over nail and surrounding skin. Apply daily over previous coat. After seven (7) days, file nail and continue cycle.   [employer] MAGNESIUM OXIDE 250 MG TABS TAKE 1 TABLET BY MOUTH WITH EVENING MEAL DAILY   donepezil (ARICEPT) 10 MG tablet Take 1 tablet (10 mg total) by mouth at bedtime.   ferrous sulfate 325 (65 FE) MG tablet Take 325 mg by mouth daily with breakfast.   furosemide (LASIX) 20 MG tablet Take 1 tablet by mouth Mon - Fri daily   gabapentin (NEURONTIN) 100 MG capsule Take 1 capsule (100 mg total) by mouth 2 (two) times daily.   ibuprofen (ADVIL) 200 MG tablet Take 400 mg by mouth every 6 (six) hours as needed (pain).   memantine (NAMENDA) 5 MG tablet Take 1 tablet (5 mg total) by mouth daily.   metoprolol tartrate (LOPRESSOR) 25 MG tablet Take 1 tablet (25 mg total) by mouth 2 (two) times daily.   Multiple Vitamin (MULTIVITAMIN WITH MINERALS) TABS tablet Take 1 tablet by mouth daily.   nitroGLYCERIN (NITROSTAT) 0.4 MG SL tablet PLACE ONE TABLET UNDER THE TONGUE EVERY 5 MINUTES AS NEEDED FOR CHEST PAIN.   oxybutynin (DITROPAN) 5 MG tablet Take 0.5 tablets (2.5 mg total) by mouth 2 (two) times daily.   pantoprazole (PROTONIX) 40 MG tablet Take 1 tablet (40 mg total) by mouth 2 (two)  times daily.   polyethylene glycol (MIRALAX / GLYCOLAX) packet Take 17 g by mouth daily as needed for mild constipation.   Potassium Chloride ER 20 MEQ TBCR Take 2 tablets by mouth every morning.   QUEtiapine (SEROQUEL) 50 MG tablet TAKE 1 TABLET BY MOUTH EVERYDAY AT BEDTIME   rosuvastatin (CRESTOR) 40 MG tablet Take 1 tablet (40 mg total) by mouth daily.   traMADol (ULTRAM) 50 MG tablet TAKE 1 TABLET 3 TIMES A DAY AND CAN TAKE 1 EXTRA 20 DAYS/MONTH   No facility-administered  encounter medications on file as of 02/14/2021.    Patient Active Problem List   Diagnosis Date Noted   Fall (on)(from) incline, initial encounter 11/23/2020   Macrocytosis without anemia 11/22/2020   Unwitnessed fall 11/22/2020   Late onset Alzheimer's dementia with behavioral disturbance ([place]) 11/18/2020   Visual hallucination 11/18/2020   Sundowning 11/18/2020   Non-small cell carcinoma of right lung, stage 1 ([place]) 06/08/2020   Pulmonary nodule 1 cm or greater in diameter 05/21/2020   Atherosclerotic heart disease of native coronary artery with other forms of angina pectoris ([place]) 04/22/2020   Memory loss 02/18/2019   Vitamin B12 deficiency 10/23/2018   Stage 3 chronic kidney disease ([place]) 06/03/2018   Chronic anemia 06/03/2018   Hypercholesterolemia 06/03/2018   Malnutrition of moderate degree 07/12/2017   Hypoalbuminemia 07/11/2017   Elevated LFTs 07/11/2017   Snoring 12/21/2016   Peripheral musculoskeletal gait disorder 02/05/2015   Lumbar post-laminectomy syndrome 02/05/2015   CAD in native artery 10/16/2014   NSTEMI (non-ST elevated myocardial infarction) ([place]) 09/13/2014   Cardiomyopathy, ischemic 92/33/0076   Chronic systolic heart failure ([place])    Tobacco abuse 09/12/2014   Dyslipidemia 09/12/2014   Normocytic anemia 09/12/2014   Chronic diastolic heart failure, NYHA class 1 ([place]) 09/12/2014   Postlaminectomy syndrome, cervical region 08/01/2013   Chronic midline low back pain with left-sided sciatica 08/01/2013   Shoulder joint contracture 08/01/2013   HTN (hypertension) 03/03/2013   Abnormal genetic test 03/03/2013    Conditions to be addressed/monitored: CHF, HTN, CKD Stage III, and Dementia; Level of care concerns  Care Plan : Social Work [name] Care Plan  Updates made by [name] since 02/14/2021 12:00 AM     Problem: Disease Progression      Goal: Disease Progression Managed   Start Date: 11/12/2020  Priority: High  Note:   Current Barriers:   Chronic disease management support and education needs related to CHF, HTN, CKD Stage III, and Mild Cognitive Impairment   Increased Agitation  Social Worker Clinical Goal(s):  patient will work with SW to identify and address any acute and/or chronic care coordination needs related to the self health management of CHF, HTN, CKD Stage III, and Mild Cognitive Impairment   Patient and her son will work with SW to identify long term care options Patient, with the help of her son, will visit the ED to evaluate cause of increased agitation and combativeness Goal not met  SW Interventions:  Inter-disciplinary care team collaboration (see longitudinal plan of care) Collaboration with [name], FNP regarding development and update of comprehensive plan of care as evidenced by provider attestation and co-signature Collaboration with [name], FNP who indicates patients son contact the providers office requesting assistance due to increased agitation Collaboration with [place] to discuss previous interventions and possible interventions needed to assist with patient care Discussed opportunity to involve home health SW to assist with patient care needs- patients provider agreed to plan  and placed referral Discussed possibility patient may have a UTI  Determined [name] FNP would like the patient to come in for a lab visit to check her urine SW placed a successful outbound call to the patients son Sara Fox to assist with care coordination needs Once SW introduced self, [name] placed the patient on the phone. Mrs. Brazzle sounded tired on the phone but did endorse she was feeling ok, ate lunch, took her medications, and was drinking water. SW requested Mrs. Leichter place her son back on the call SW expressed concern that patient may have a UTI due to her reluctance to bathe routinely and her provider would like her to come in for a urine check [name] stated "not  today, I have to work! She don't wash and she don't listen!" Discussed concern that if a UTI goes untreated the patient may end up in the hospital which is what happened several months ago [name] stated "this isn't a UTI this is her mind" [name] reports the patient was agitated over the weekend trying to leave the home barefoot in the middle of the night to bring someone keys "I had to physically pull her back to stop her from walking out the door. When I grab her she yells like I am hurting her but she doesn't respond to just talking. I had to physically grab her and take her to her room" SW assessed for how the patient responded to be placed in her room- [name] stated she did not try to leave the house again Encouraged [name] to dial 911 when he needs assistance with Mrs. Korpela especially if she is agitated and/or combative Attempted to educate [name] on causes of agitation and how redirection may help - [name] indicates we (the office) is not doing anything to help him and he has repeatedly asked for help Reviewed multiple times this SW has attempted to assist with caregiver stress including attempts to place the patient and enroll in PACE SW reminded [name] that each time this SW has moved forward with a resource he has changed his mind due to several different reasons including not wanting to lose insurance policies, difficulty getting the patient to bathe, concern with dogs in the home, and frustration with how long each process takes [name] does indicate he wants to move forward with placement at this time Advised [name] he may apply for Medicaid one of three ways including in person, online, or by mail. Reviewed that applying by mail will take the longest due to time it takes for forms to reach [name] Determined [name] would like SW to mail him a Engineer, agricultural with patients primary provider who indicates appropriate level of care is  SNF Mailed a Medicaid application to the patients home address Advised [name] the patients primary provider has placed a referral for home health RN and SW to visit the home and assess patient needs Encouraged him to engage with this program when they call Discussed Mr. Virginia Crews plans to take dogs to the animal shelter later this week Reviewed previous plan was to enroll the patient with an in home primary care provider once he removes pets Determined [name] is not sure if he will move forward with this due to desire for placement Advised [name] if he would like to move forward with switching primary providers the care management team is able to assist with that transition  Discussed  plan for SW to follow up with Mr Kennon Rounds over the next 14 days  Collaboration with [name] FNP and [place] regarding interventions and plan  Patient Goals/Self-Care Activities patient will: with the help of her son  -  Engage with home health -Call 911 for assistance with increased agitation -Complete and submit Medicaid application -Contact SW as needed prior to next scheduled call  Follow Up Plan:  SW will follow up with patient and her son over the next 10 days       Follow Up Plan: SW will follow up with patient by phone over the next 14 days      [name], BSW, CDP Social Worker, Certified Dementia Practitioner [place] [facility] [phone]

## 2021-02-16 ENCOUNTER — Ambulatory Visit: Payer: Medicare Other

## 2021-02-16 ENCOUNTER — Telehealth: Payer: Self-pay

## 2021-02-16 DIAGNOSIS — G301 Alzheimer's disease with late onset: Secondary | ICD-10-CM

## 2021-02-16 DIAGNOSIS — F02818 Dementia in other diseases classified elsewhere, unspecified severity, with other behavioral disturbance: Secondary | ICD-10-CM

## 2021-02-16 DIAGNOSIS — I5032 Chronic diastolic (congestive) heart failure: Secondary | ICD-10-CM

## 2021-02-16 DIAGNOSIS — N1831 Chronic kidney disease, stage 3a: Secondary | ICD-10-CM

## 2021-02-16 NOTE — Chronic Care Management (AMB) (Signed)
Chronic Care Management    Social Work Note  02/16/2021 Name: Sara Fox MRN: [id number] DOB: 1941-02-04  JESUS POPLIN is a 80 y.o. year old female who is a primary care patient of [name], [place]. The CCM team was consulted to assist the patient with chronic disease management and/or care coordination needs related to:  Late onset Alzheimer's Disease, patient safety and level of care concerns .   Collaboration with [facility] APS   to place a new APS report  in response to provider referral for social work chronic care management and care coordination services.   Consent to Services:  The patient was given information about Chronic Care Management services, agreed to services, and gave verbal consent prior to initiation of services.  Please see initial visit note for detailed documentation.   Patient agreed to services and consent obtained.   Assessment: Review of patient past medical history, allergies, medications, and health status, including review of relevant consultants reports was performed today as part of a comprehensive evaluation and provision of chronic care management and care coordination services.     SDOH (Social Determinants of Health) assessments and interventions performed:    Advanced Directives Status: Not addressed in this encounter.  CCM Care Plan  Allergies  Allergen Reactions   Buprenorphine Hcl Shortness Of Breath    Throat swelling/trouble breathing and lethargic   Morphine And Related Shortness Of Breath    Throat swelling/trouble breathing and lethargic   Celebrex [Celecoxib] Diarrhea and Swelling    Swelling of legs    Vioxx [Rofecoxib] Palpitations   Codeine Other (See Comments)    Bloated     Outpatient Encounter Medications as of 02/16/2021  Medication Sig   aspirin EC 81 MG tablet Take 81 mg by mouth daily as needed (headache).   budesonide-formoterol (SYMBICORT) 160-4.5 MCG/ACT inhaler TAKE 2 PUFFS BY MOUTH TWICE A DAY   Calcium  Citrate-Vitamin D (CALCIUM CITRATE+D3 PETITES PO) Take 1 tablet by mouth daily.   cholecalciferol (VITAMIN D) 25 MCG (1000 UNIT) tablet Take 1,000 Units by mouth daily.   ciclopirox (PENLAC) 8 % solution Apply 1 application topically at bedtime. Apply over nail and surrounding skin. Apply daily over previous coat. After seven (7) days, file nail and continue cycle.   [employer] MAGNESIUM OXIDE 250 MG TABS TAKE 1 TABLET BY MOUTH WITH EVENING MEAL DAILY   donepezil (ARICEPT) 10 MG tablet Take 1 tablet (10 mg total) by mouth at bedtime.   ferrous sulfate 325 (65 FE) MG tablet Take 325 mg by mouth daily with breakfast.   furosemide (LASIX) 20 MG tablet Take 1 tablet by mouth Mon - Fri daily   gabapentin (NEURONTIN) 100 MG capsule Take 1 capsule (100 mg total) by mouth 2 (two) times daily.   ibuprofen (ADVIL) 200 MG tablet Take 400 mg by mouth every 6 (six) hours as needed (pain).   memantine (NAMENDA) 5 MG tablet Take 1 tablet (5 mg total) by mouth daily.   metoprolol tartrate (LOPRESSOR) 25 MG tablet Take 1 tablet (25 mg total) by mouth 2 (two) times daily.   Multiple Vitamin (MULTIVITAMIN WITH MINERALS) TABS tablet Take 1 tablet by mouth daily.   nitroGLYCERIN (NITROSTAT) 0.4 MG SL tablet PLACE ONE TABLET UNDER THE TONGUE EVERY 5 MINUTES AS NEEDED FOR CHEST PAIN.   oxybutynin (DITROPAN) 5 MG tablet Take 0.5 tablets (2.5 mg total) by mouth 2 (two) times daily.   pantoprazole (PROTONIX) 40 MG tablet Take 1 tablet (40 mg total)  by mouth 2 (two) times daily.   polyethylene glycol (MIRALAX / GLYCOLAX) packet Take 17 g by mouth daily as needed for mild constipation.   Potassium Chloride ER 20 MEQ TBCR Take 2 tablets by mouth every morning.   QUEtiapine (SEROQUEL) 50 MG tablet TAKE 1 TABLET BY MOUTH EVERYDAY AT BEDTIME   rosuvastatin (CRESTOR) 40 MG tablet Take 1 tablet (40 mg total) by mouth daily.   traMADol (ULTRAM) 50 MG tablet TAKE 1 TABLET 3 TIMES A DAY AND CAN TAKE 1 EXTRA 20 DAYS/MONTH   No  facility-administered encounter medications on file as of 02/16/2021.    Patient Active Problem List   Diagnosis Date Noted   Fall (on)(from) incline, initial encounter 11/23/2020   Macrocytosis without anemia 11/22/2020   Unwitnessed fall 11/22/2020   Late onset Alzheimer's dementia with behavioral disturbance ([place]) 11/18/2020   Visual hallucination 11/18/2020   Sundowning 11/18/2020   Non-small cell carcinoma of right lung, stage 1 ([place]) 06/08/2020   Pulmonary nodule 1 cm or greater in diameter 05/21/2020   Atherosclerotic heart disease of native coronary artery with other forms of angina pectoris ([place]) 04/22/2020   Memory loss 02/18/2019   Vitamin B12 deficiency 10/23/2018   Stage 3 chronic kidney disease ([place]) 06/03/2018   Chronic anemia 06/03/2018   Hypercholesterolemia 06/03/2018   Malnutrition of moderate degree 07/12/2017   Hypoalbuminemia 07/11/2017   Elevated LFTs 07/11/2017   Snoring 12/21/2016   Peripheral musculoskeletal gait disorder 02/05/2015   Lumbar post-laminectomy syndrome 02/05/2015   CAD in native artery 10/16/2014   NSTEMI (non-ST elevated myocardial infarction) ([place]) 09/13/2014   Cardiomyopathy, ischemic 16/02/9603   Chronic systolic heart failure ([place])    Tobacco abuse 09/12/2014   Dyslipidemia 09/12/2014   Normocytic anemia 09/12/2014   Chronic diastolic heart failure, NYHA class 1 ([place]) 09/12/2014   Postlaminectomy syndrome, cervical region 08/01/2013   Chronic midline low back pain with left-sided sciatica 08/01/2013   Shoulder joint contracture 08/01/2013   HTN (hypertension) 03/03/2013   Abnormal genetic test 03/03/2013    Conditions to be addressed/monitored: CHF, HTN, and Dementia; Level of care concerns, Limited access to caregiver, Memory Deficits, and Patient Safety  Care Plan : Social Work [place]  Updates made by [name] since 02/16/2021 12:00 AM     Problem: Disease Progression      Goal: Disease Progression Managed    Start Date: 11/12/2020  Priority: High  Note:   Current Barriers:  Chronic disease management support and education needs related to CHF, HTN, CKD Stage III, and Mild Cognitive Impairment   Increased Agitation  Social Worker Clinical Goal(s):  patient will work with SW to identify and address any acute and/or chronic care coordination needs related to the self health management of CHF, HTN, CKD Stage III, and Mild Cognitive Impairment   Patient and her son will work with SW to identify long term care options Patient, with the help of her son, will visit the ED to evaluate cause of increased agitation and combativeness Goal not met  SW Interventions:  Inter-disciplinary care team collaboration (see longitudinal plan of care) Collaboration with [name], [place] regarding development and update of comprehensive plan of care as evidenced by provider attestation and co-signature Collaboration with [name], [place] who indicates patients family has declined home health to visit the home until next Thursday Discussion with [name] FNP and [place] regarding concern for patients ability to access medical care and possibility of a UTI. Discussed concerns  with patient needing a higher level of care than her family can provide due to Alzheimer's disease progression Outbound call placed to APS to report concerns with patients limited access to health care, patient being left alone when her son is at work, and concern for patient needing a higher level of care Report placed to [place] who indicates she will notify SW if patients case is picked up by team for a home visit Collaboration with [name] FNP and [place] to advise of intervention and plan  Patient Goals/Self-Care Activities patient will: with the help of her son  -  Engage with home health -Call 911 for assistance with increased agitation -Complete and submit Medicaid application -Contact SW  as needed prior to next scheduled call  Follow Up Plan:  SW will follow up with patient and her son over the next 10 days       Follow Up Plan: SW will follow up with patient by phone over the next 10 days      [name], BSW, CDP Social Worker, Certified Dementia Practitioner [place] [facility] [place] [facility] [phone]

## 2021-02-16 NOTE — Telephone Encounter (Signed)
Rosezena Sensor RN with [facility] left a message that the start of care has been pushed out until next Thursday at the request of the pt's granddaughter.  Nursing was supposed to start today.

## 2021-02-17 ENCOUNTER — Ambulatory Visit: Payer: Medicare Other

## 2021-02-17 DIAGNOSIS — I10 Essential (primary) hypertension: Secondary | ICD-10-CM

## 2021-02-17 DIAGNOSIS — G301 Alzheimer's disease with late onset: Secondary | ICD-10-CM

## 2021-02-17 DIAGNOSIS — N1831 Chronic kidney disease, stage 3a: Secondary | ICD-10-CM

## 2021-02-17 DIAGNOSIS — F02818 Dementia in other diseases classified elsewhere, unspecified severity, with other behavioral disturbance: Secondary | ICD-10-CM

## 2021-02-17 NOTE — Chronic Care Management (AMB) (Signed)
Chronic Care Management    Social Work Note  02/17/2021 Name: Sara Fox MRN: [id number] DOB: 01/23/1941  Sara Fox is a 80 y.o. year old female who is a primary care patient of Arnette Felts, FNP. The CCM team was consulted to assist the patient with chronic disease management and/or care coordination needs related to:  late onset Alzheimer's and patient safety concerns .   Collaboration with primary care team  for  case coordination  in response to provider referral for social work chronic care management and care coordination services.   Consent to Services:  The patient was given information about Chronic Care Management services, agreed to services, and gave verbal consent prior to initiation of services.  Please see initial visit note for detailed documentation.   Patient agreed to services and consent obtained.   Assessment: Review of patient past medical history, allergies, medications, and health status, including review of relevant consultants reports was performed today as part of a comprehensive evaluation and provision of chronic care management and care coordination services.     SDOH (Social Determinants of Health) assessments and interventions performed:    Advanced Directives Status: Not addressed in this encounter.  CCM Care Plan  Allergies  Allergen Reactions   Buprenorphine Hcl Shortness Of Breath    Throat swelling/trouble breathing and lethargic   Morphine And Related Shortness Of Breath    Throat swelling/trouble breathing and lethargic   Celebrex [Celecoxib] Diarrhea and Swelling    Swelling of legs    Vioxx [Rofecoxib] Palpitations   Codeine Other (See Comments)    Bloated     Outpatient Encounter Medications as of 02/17/2021  Medication Sig   aspirin EC 81 MG tablet Take 81 mg by mouth daily as needed (headache).   budesonide-formoterol (SYMBICORT) 160-4.5 MCG/ACT inhaler TAKE 2 PUFFS BY MOUTH TWICE A DAY   Calcium Citrate-Vitamin D (CALCIUM  CITRATE+D3 PETITES PO) Take 1 tablet by mouth daily.   cholecalciferol (VITAMIN D) 25 MCG (1000 UNIT) tablet Take 1,000 Units by mouth daily.   ciclopirox (PENLAC) 8 % solution Apply 1 application topically at bedtime. Apply over nail and surrounding skin. Apply daily over previous coat. After seven (7) days, file nail and continue cycle.   [employer]S MAGNESIUM OXIDE 250 MG TABS TAKE 1 TABLET BY MOUTH WITH EVENING MEAL DAILY   donepezil (ARICEPT) 10 MG tablet Take 1 tablet (10 mg total) by mouth at bedtime.   ferrous sulfate 325 (65 FE) MG tablet Take 325 mg by mouth daily with breakfast.   furosemide (LASIX) 20 MG tablet Take 1 tablet by mouth Mon - Fri daily   gabapentin (NEURONTIN) 100 MG capsule Take 1 capsule (100 mg total) by mouth 2 (two) times daily.   ibuprofen (ADVIL) 200 MG tablet Take 400 mg by mouth every 6 (six) hours as needed (pain).   memantine (NAMENDA) 5 MG tablet Take 1 tablet (5 mg total) by mouth daily.   metoprolol tartrate (LOPRESSOR) 25 MG tablet Take 1 tablet (25 mg total) by mouth 2 (two) times daily.   Multiple Vitamin (MULTIVITAMIN WITH MINERALS) TABS tablet Take 1 tablet by mouth daily.   nitroGLYCERIN (NITROSTAT) 0.4 MG SL tablet PLACE ONE TABLET UNDER THE TONGUE EVERY 5 MINUTES AS NEEDED FOR CHEST PAIN.   oxybutynin (DITROPAN) 5 MG tablet Take 0.5 tablets (2.5 mg total) by mouth 2 (two) times daily.   pantoprazole (PROTONIX) 40 MG tablet Take 1 tablet (40 mg total) by mouth 2 (two) times daily.  polyethylene glycol (MIRALAX / GLYCOLAX) packet Take 17 g by mouth daily as needed for mild constipation.   Potassium Chloride ER 20 MEQ TBCR Take 2 tablets by mouth every morning.   QUEtiapine (SEROQUEL) 50 MG tablet TAKE 1 TABLET BY MOUTH EVERYDAY AT BEDTIME   rosuvastatin (CRESTOR) 40 MG tablet Take 1 tablet (40 mg total) by mouth daily.   traMADol (ULTRAM) 50 MG tablet TAKE 1 TABLET 3 TIMES A DAY AND CAN TAKE 1 EXTRA 20 DAYS/MONTH   No facility-administered encounter  medications on file as of 02/17/2021.    Patient Active Problem List   Diagnosis Date Noted   Fall (on)(from) incline, initial encounter 11/23/2020   Macrocytosis without anemia 11/22/2020   Unwitnessed fall 11/22/2020   Late onset Alzheimer's dementia with behavioral disturbance (Sara Fox) 11/18/2020   Visual hallucination 11/18/2020   Sundowning 11/18/2020   Non-small cell carcinoma of right lung, stage 1 ([place]ara Fox) 06/08/2020   Pulmonary nodule 1 cm or greater in diameter 05/21/2020   Atherosclerotic heart disease of native coronary artery with other forms of angina pectoris ([place]) 04/22/2020   Memory loss 02/18/2019   Vitamin B12 deficiency 10/23/2018   Stage 3 chronic kidney disease ([place]) 06/03/2018   Chronic anemia 06/03/2018   Hypercholesterolemia 06/03/2018   Malnutrition of moderate degree 07/12/2017   Hypoalbuminemia 07/11/2017   Elevated LFTs 07/11/2017   Snoring 12/21/2016   Peripheral musculoskeletal gait disorder 02/05/2015   Lumbar post-laminectomy syndrome 02/05/2015   CAD in native artery 10/16/2014   NSTEMI (non-ST elevated myocardial infarction) (Sara Fox) 09/13/2014   Cardiomyopathy, ischemic 91/47/8295   Chronic systolic heart failure (Sara Fox)    Tobacco abuse 09/12/2014   Dyslipidemia 09/12/2014   Normocytic anemia 09/12/2014   Chronic diastolic heart failure, NYHA class 1 (Sara Fox) 09/12/2014   Postlaminectomy syndrome, cervical region 08/01/2013   Chronic midline low back pain with left-sided sciatica 08/01/2013   Shoulder joint contracture 08/01/2013   HTN (hypertension) 03/03/2013   Abnormal genetic test 03/03/2013    Conditions to be addressed/monitored: CHF, HTN, CKD Stage III, and Alzheimer's Disease ;  Patient Safety Concerns  Care Plan : Social Work [facility] Care Plan  Updates made by [name] since 02/17/2021 12:00 AM     Problem: Disease Progression      Goal: Disease Progression Managed   Start Date: 11/12/2020  Priority: High  Note:   Current Barriers:   Chronic disease management support and education needs related to CHF, HTN, CKD Stage III, and Mild Cognitive Impairment   Increased Agitation  Social Worker Clinical Goal(s):  patient will work with SW to identify and address any acute and/or chronic care coordination needs related to the self health management of CHF, HTN, CKD Stage III, and Mild Cognitive Impairment   Patient and her son will work with SW to identify long term care options Patient, with the help of her son, will visit the ED to evaluate cause of increased agitation and combativeness Goal not met  SW Interventions:  Inter-disciplinary care team collaboration (see longitudinal plan of care) Collaboration with [name], FNP regarding development and update of comprehensive plan of care as evidenced by provider attestation and co-signature Received communication from Buzzy Sara, Education officer, museum with APS indicating the report has been accepted and a home visit would take place Mrs. Harlow Mares indicated the report would be referred to the [employer] office and/or law enforcement to complete the home visit Collaboration with patients provider [name] FNP and [place] to advise of  outcome of APS referral SW to continue to monitor for updates  Patient Goals/Self-Care Activities patient will: with the help of her son  -  Engage with home health -Call 911 for assistance with increased agitation -Complete and submit Medicaid application -Contact SW as needed prior to next scheduled call  Follow Up Plan:  SW will follow up with patient and her son over the next 10 days       Follow Up Plan: SW will follow up with patient by phone over the next week      [name], BSW, CDP Social Worker, Certified Dementia Practitioner [place] [facility] [place] [facility] [phone]

## 2021-02-18 ENCOUNTER — Telehealth: Payer: Medicare Other

## 2021-02-21 ENCOUNTER — Ambulatory Visit: Payer: Self-pay

## 2021-02-21 ENCOUNTER — Telehealth: Payer: Medicare Other

## 2021-02-21 DIAGNOSIS — I5032 Chronic diastolic (congestive) heart failure: Secondary | ICD-10-CM

## 2021-02-21 DIAGNOSIS — I25118 Atherosclerotic heart disease of native coronary artery with other forms of angina pectoris: Secondary | ICD-10-CM

## 2021-02-21 DIAGNOSIS — G301 Alzheimer's disease with late onset: Secondary | ICD-10-CM

## 2021-02-21 DIAGNOSIS — G3184 Mild cognitive impairment, so stated: Secondary | ICD-10-CM

## 2021-02-21 DIAGNOSIS — I10 Essential (primary) hypertension: Secondary | ICD-10-CM

## 2021-02-21 DIAGNOSIS — N1831 Chronic kidney disease, stage 3a: Secondary | ICD-10-CM

## 2021-02-21 DIAGNOSIS — C3491 Malignant neoplasm of unspecified part of right bronchus or lung: Secondary | ICD-10-CM

## 2021-02-21 NOTE — Chronic Care Management (AMB) (Signed)
Chronic Care Management   CCM RN Visit Note  02/21/2021 Name: Sara Fox MRN: [id number] DOB: 09-24-1940  Subjective: Sara Fox is a 80 y.o. year old female who is a primary care patient of [name], [place]. The care management team was consulted for assistance with disease management and care coordination needs.    Engaged with patient by telephone for follow up visit in response to provider referral for case management and/or care coordination services.   Consent to Services:  The patient was given information about Chronic Care Management services, agreed to services, and gave verbal consent prior to initiation of services.  Please see initial visit note for detailed documentation.   Patient agreed to services and verbal consent obtained.   Assessment: Review of patient past medical history, allergies, medications, health status, including review of consultants reports, laboratory and other test data, was performed as part of comprehensive evaluation and provision of chronic care management services.   SDOH (Social Determinants of Health) assessments and interventions performed:    CCM Care Plan  Allergies  Allergen Reactions   Buprenorphine Hcl Shortness Of Breath    Throat swelling/trouble breathing and lethargic   Morphine And Related Shortness Of Breath    Throat swelling/trouble breathing and lethargic   Celebrex [Celecoxib] Diarrhea and Swelling    Swelling of legs    Vioxx [Rofecoxib] Palpitations   Codeine Other (See Comments)    Bloated     Outpatient Encounter Medications as of 02/21/2021  Medication Sig   aspirin EC 81 MG tablet Take 81 mg by mouth daily as needed (headache).   budesonide-formoterol (SYMBICORT) 160-4.5 MCG/ACT inhaler TAKE 2 PUFFS BY MOUTH TWICE A DAY   Calcium Citrate-Vitamin D (CALCIUM CITRATE+D3 PETITES PO) Take 1 tablet by mouth daily.   cholecalciferol (VITAMIN D) 25 MCG (1000 UNIT) tablet Take 1,000 Units by mouth daily.    ciclopirox (PENLAC) 8 % solution Apply 1 application topically at bedtime. Apply over nail and surrounding skin. Apply daily over previous coat. After seven (7) days, file nail and continue cycle.   [employer] MAGNESIUM OXIDE 250 MG TABS TAKE 1 TABLET BY MOUTH WITH EVENING MEAL DAILY   donepezil (ARICEPT) 10 MG tablet Take 1 tablet (10 mg total) by mouth at bedtime.   ferrous sulfate 325 (65 FE) MG tablet Take 325 mg by mouth daily with breakfast.   furosemide (LASIX) 20 MG tablet Take 1 tablet by mouth Mon - Fri daily   gabapentin (NEURONTIN) 100 MG capsule Take 1 capsule (100 mg total) by mouth 2 (two) times daily.   ibuprofen (ADVIL) 200 MG tablet Take 400 mg by mouth every 6 (six) hours as needed (pain).   memantine (NAMENDA) 5 MG tablet Take 1 tablet (5 mg total) by mouth daily.   metoprolol tartrate (LOPRESSOR) 25 MG tablet Take 1 tablet (25 mg total) by mouth 2 (two) times daily.   Multiple Vitamin (MULTIVITAMIN WITH MINERALS) TABS tablet Take 1 tablet by mouth daily.   nitroGLYCERIN (NITROSTAT) 0.4 MG SL tablet PLACE ONE TABLET UNDER THE TONGUE EVERY 5 MINUTES AS NEEDED FOR CHEST PAIN.   oxybutynin (DITROPAN) 5 MG tablet Take 0.5 tablets (2.5 mg total) by mouth 2 (two) times daily.   pantoprazole (PROTONIX) 40 MG tablet Take 1 tablet (40 mg total) by mouth 2 (two) times daily.   polyethylene glycol (MIRALAX / GLYCOLAX) packet Take 17 g by mouth daily as needed for mild constipation.   Potassium Chloride ER 20 MEQ TBCR Take 2  tablets by mouth every morning.   QUEtiapine (SEROQUEL) 50 MG tablet TAKE 1 TABLET BY MOUTH EVERYDAY AT BEDTIME   rosuvastatin (CRESTOR) 40 MG tablet Take 1 tablet (40 mg total) by mouth daily.   traMADol (ULTRAM) 50 MG tablet TAKE 1 TABLET 3 TIMES A DAY AND CAN TAKE 1 EXTRA 20 DAYS/MONTH   No facility-administered encounter medications on file as of 02/21/2021.    Patient Active Problem List   Diagnosis Date Noted   Fall (on)(from) incline, initial encounter 11/23/2020    Macrocytosis without anemia 11/22/2020   Unwitnessed fall 11/22/2020   Late onset Alzheimer's dementia with behavioral disturbance ([place]) 11/18/2020   Visual hallucination 11/18/2020   Sundowning 11/18/2020   Non-small cell carcinoma of right lung, stage 1 ([place]) 06/08/2020   Pulmonary nodule 1 cm or greater in diameter 05/21/2020   Atherosclerotic heart disease of native coronary artery with other forms of angina pectoris ([place]) 04/22/2020   Memory loss 02/18/2019   Vitamin B12 deficiency 10/23/2018   Stage 3 chronic kidney disease ([place]) 06/03/2018   Chronic anemia 06/03/2018   Hypercholesterolemia 06/03/2018   Malnutrition of moderate degree 07/12/2017   Hypoalbuminemia 07/11/2017   Elevated LFTs 07/11/2017   Snoring 12/21/2016   Peripheral musculoskeletal gait disorder 02/05/2015   Lumbar post-laminectomy syndrome 02/05/2015   CAD in native artery 10/16/2014   NSTEMI (non-ST elevated myocardial infarction) ([place]) 09/13/2014   Cardiomyopathy, ischemic 29/51/8841   Chronic systolic heart failure ([place])    Tobacco abuse 09/12/2014   Dyslipidemia 09/12/2014   Normocytic anemia 09/12/2014   Chronic diastolic heart failure, NYHA class 1 ([place]) 09/12/2014   Postlaminectomy syndrome, cervical region 08/01/2013   Chronic midline low back pain with left-sided sciatica 08/01/2013   Shoulder joint contracture 08/01/2013   HTN (hypertension) 03/03/2013   Abnormal genetic test 03/03/2013    Conditions to be addressed/monitored: Stage 3a chronic kidney disease, HTN, chronic systolic heart failure, Mild Cognitive Impairment, Atherosclerotic heart disease of native coronary artery with other forms of angina pectoris, Lung Nodule, 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    Care Plan : Dementia (Adult)  Updates made by Lynne Logan, RN since 02/21/2021 12:00 AM     Problem: Cognitive Function   Priority: High     Long-Range Goal: Optimal Cognitive Function   Start Date:  07/26/2020  Expected End Date: 07/26/2021  Recent Progress: On track  Priority: High  Note:   Current Barriers:  Ineffective Self Health Maintenance Unable to self administer medications as prescribed Unable to perform ADLs independently Unable to perform IADLs independently Cognitive deficit  Clinical Goal(s):  Collaboration with [name], FNP regarding development and update of comprehensive plan of care as evidenced by provider attestation and co-signature Inter-disciplinary care team collaboration (see longitudinal plan of care) patient will work with care management team to address care coordination and chronic disease management needs related to Disease Management Educational Needs Care Coordination Medication Management and Education Psychosocial Support Dementia and Caregiver Support   Interventions:  02/21/21 completed successful outbound call with son [name]  Evaluation of current treatment plan related to Dementia and patient's adherence to plan as established by provider. Collaboration with [name], FNP regarding development and update of comprehensive plan of care as evidenced by provider attestation       and co-signature Inter-disciplinary care team collaboration (see longitudinal plan of care) Determined [name] is calling today to advise he received a home visit from a DSS worker this past Friday Determined [name] is  aware the referral for APS was placed via this office/PCP Validated Mr. Virginia Crews feelings and provided active listening to his concerns regarding his mother's declining cognition and his attempts to care for her Discussed [name] plans to bring Ms. Nicklin in this morning for a urine check to rule out UTI due increased agitation over the past several days Discussed stat [facility] order for SNV last week, discussed the [facility] agency contacted the PCP to advise this visit was refused Determined the [facility] agency contact Ms. Botello's granddaughter instead of Mr.  Kennon Rounds, his niece is not involved with Ms. Flores's care but is listed as a secondary contact Determined patient's granddaughter did not schedule a nurse visit due to the family having 4 large dogs in the home and they did not want any safety concerns for the nurse Determined [name] has since removed the dogs from their home and feels it is now safe for home care, although he was instructed by DSS to bring patient in for a home visit for evaluation of UTI Collaborated with PCP [name], FNP to confirm patient may stop by the office for a urine and she is agreeable   Reinforced the importance for [name] to try to stay calm when redirecting patient and assisting her with ADL's/iADL's in consideration of her advanced dementia Further discussed and educated about resources such as [facility], [facility] and [facility] to consider for establishment in order to ensure patient receives the care needed Determined [name] continues to consider having his mother placed into a long term nursing facility if approved and if he can locate a facility he feels will provide her with good care, he plans to complete the Medicaid application sent by embedded BSW, this weekend and will take it to DSS in person Provided [name] with the contact information for these providers and encouraged him to call each provider, taking notes on what services may be provided to help him determined which of these may be the best option for Ms. Hruska  Determined [name] is interested in having his mother placed on Ativan for agitation Discussed and reviewed having [name] schedule a follow up with Neurology for best evaluation and determination of further pharmacological recommendations such as Ativan to help with agitation Discussed plans with patient for ongoing care management follow up and provided patient with direct contact information for care management team 02/21/21 Case Collaboration  Determined Ms.  Mccutchen was unable to leave a urine sample while at PCP office today Collaborated with PCP and embedded [place] regarding long term care planning for Ms. Fletcher outbound call to [employer], spoke with Jenny Reichmann who advised [employer] is out of network, however this provider will contact [facility] to ask for an exception of benefits, she will keep this RN CM updated on the outcome Discussed [name] contacted [facility] as well asking for new patient establishment and spoke with Cecille Rubin Sent secure message to PCP and embedded BSW with an update concerning attempts being made to get a benefit exception from [facility] in order to enroll Ms. Shenker into their [facility] program  Self Care Activities:  Continue to keep all MD follow up appointments  Continue to take all medications exactly as prescribed  Continue to call the pharmacy for refills at least 7 days before taking last dose Notify the CCM team and or PCP if you are unable to afford your medication refills Call the CCM team and or  PCP for questions or concerns  Patient Goals: - continue to engage with embedded BSW for long term care planning  - complete Medicaid application as soon as possible  - consider [facility], [facility] and or [place]  - contact Neurologist to schedule a follow up concerning pharmacological recommendations for agitation   Follow Up Plan: Telephone follow up appointment with care management team member scheduled for: 03/24/21     Plan:Telephone follow up appointment with care management team member scheduled for:  03/24/21  Barb Merino, RN, BSN, CCM Care Management Coordinator [facility]  Direct Phone: [phone]

## 2021-02-21 NOTE — Chronic Care Management (AMB) (Addendum)
Chronic Care Management   CCM RN Visit Note  02/14/2021 Name: Sara Fox MRN: [id number] DOB: 10/12/1940  Subjective: Sara Fox is a 80 y.o. year old female who is a primary care patient of Moore, Janece, FNP. The care management team was consulted for assistance with disease management and care coordination needs.    Engaged with patient by telephone for follow up visit in response to provider referral for case management and/or care coordination services.   Consent to Services:  The patient was given information about Chronic Care Management services, agreed to services, and gave verbal consent prior to initiation of services.  Please see initial visit note for detailed documentation.   Patient agreed to services and verbal consent obtained.   Assessment: Review of patient past medical history, allergies, medications, health status, including review of consultants reports, laboratory and other test data, was performed as part of comprehensive evaluation and provision of chronic care management services.   SDOH (Social Determinants of Health) assessments and interventions performed:  yes  CCM Care Plan  Allergies  Allergen Reactions   Buprenorphine Hcl Shortness Of Breath    Throat swelling/trouble breathing and lethargic   Morphine And Related Shortness Of Breath    Throat swelling/trouble breathing and lethargic   Celebrex [Celecoxib] Diarrhea and Swelling    Swelling of legs    Vioxx [Rofecoxib] Palpitations   Codeine Other (See Comments)    Bloated     Outpatient Encounter Medications as of 02/14/2021  Medication Sig   aspirin EC 81 MG tablet Take 81 mg by mouth daily as needed (headache).   budesonide-formoterol (SYMBICORT) 160-4.5 MCG/ACT inhaler TAKE 2 PUFFS BY MOUTH TWICE A DAY   Calcium Citrate-Vitamin D (CALCIUM CITRATE+D3 PETITES PO) Take 1 tablet by mouth daily.   cholecalciferol (VITAMIN D) 25 MCG (1000 UNIT) tablet Take 1,000 Units by mouth daily.    ciclopirox (PENLAC) 8 % solution Apply 1 application topically at bedtime. Apply over nail and surrounding skin. Apply daily over previous coat. After seven (7) days, file nail and continue cycle.   [employer] MAGNESIUM OXIDE 250 MG TABS TAKE 1 TABLET BY MOUTH WITH EVENING MEAL DAILY   donepezil (ARICEPT) 10 MG tablet Take 1 tablet (10 mg total) by mouth at bedtime.   ferrous sulfate 325 (65 FE) MG tablet Take 325 mg by mouth daily with breakfast.   furosemide (LASIX) 20 MG tablet Take 1 tablet by mouth Mon - Fri daily   gabapentin (NEURONTIN) 100 MG capsule Take 1 capsule (100 mg total) by mouth 2 (two) times daily.   ibuprofen (ADVIL) 200 MG tablet Take 400 mg by mouth every 6 (six) hours as needed (pain).   memantine (NAMENDA) 5 MG tablet Take 1 tablet (5 mg total) by mouth daily.   metoprolol tartrate (LOPRESSOR) 25 MG tablet Take 1 tablet (25 mg total) by mouth 2 (two) times daily.   Multiple Vitamin (MULTIVITAMIN WITH MINERALS) TABS tablet Take 1 tablet by mouth daily.   nitroGLYCERIN (NITROSTAT) 0.4 MG SL tablet PLACE ONE TABLET UNDER THE TONGUE EVERY 5 MINUTES AS NEEDED FOR CHEST PAIN.   oxybutynin (DITROPAN) 5 MG tablet Take 0.5 tablets (2.5 mg total) by mouth 2 (two) times daily.   pantoprazole (PROTONIX) 40 MG tablet Take 1 tablet (40 mg total) by mouth 2 (two) times daily.   polyethylene glycol (MIRALAX / GLYCOLAX) packet Take 17 g by mouth daily as needed for mild constipation.   Potassium Chloride ER 20 MEQ TBCR Take 2   Chronic Care Management   CCM RN Visit Note  02/14/2021 Name: CHANTEL TETI MRN: [id number] DOB: Dec 07, 1940  Subjective: Sara Fox is a 80 y.o. year old female who is a primary care patient of [name], [place]. The care management team was consulted for assistance with disease management and care coordination needs.    Engaged with patient by telephone for follow up visit in response to provider referral for case management and/or care coordination services.   Consent to Services:  The patient was given information about Chronic Care Management services, agreed to services, and gave verbal consent prior to initiation of services.  Please see initial visit note for detailed documentation.   Patient agreed to services and verbal consent obtained.   Assessment: Review of patient past medical history, allergies, medications, health status, including review of consultants reports, laboratory and other test data, was performed as part of comprehensive evaluation and provision of chronic care management services.   SDOH (Social Determinants of Health) assessments and interventions performed:  yes  CCM Care Plan  Allergies  Allergen Reactions   Buprenorphine Hcl Shortness Of Breath    Throat swelling/trouble breathing and lethargic   Morphine And Related Shortness Of Breath    Throat swelling/trouble breathing and lethargic   Celebrex [Celecoxib] Diarrhea and Swelling    Swelling of legs    Vioxx [Rofecoxib] Palpitations   Codeine Other (See Comments)    Bloated     Outpatient Encounter Medications as of 02/14/2021  Medication Sig   aspirin EC 81 MG tablet Take 81 mg by mouth daily as needed (headache).   budesonide-formoterol (SYMBICORT) 160-4.5 MCG/ACT inhaler TAKE 2 PUFFS BY MOUTH TWICE A DAY   Calcium Citrate-Vitamin D (CALCIUM CITRATE+D3 PETITES PO) Take 1 tablet by mouth daily.   cholecalciferol (VITAMIN D) 25 MCG (1000 UNIT) tablet Take 1,000 Units by mouth daily.    ciclopirox (PENLAC) 8 % solution Apply 1 application topically at bedtime. Apply over nail and surrounding skin. Apply daily over previous coat. After seven (7) days, file nail and continue cycle.   [employer] MAGNESIUM OXIDE 250 MG TABS TAKE 1 TABLET BY MOUTH WITH EVENING MEAL DAILY   donepezil (ARICEPT) 10 MG tablet Take 1 tablet (10 mg total) by mouth at bedtime.   ferrous sulfate 325 (65 FE) MG tablet Take 325 mg by mouth daily with breakfast.   furosemide (LASIX) 20 MG tablet Take 1 tablet by mouth Mon - Fri daily   gabapentin (NEURONTIN) 100 MG capsule Take 1 capsule (100 mg total) by mouth 2 (two) times daily.   ibuprofen (ADVIL) 200 MG tablet Take 400 mg by mouth every 6 (six) hours as needed (pain).   memantine (NAMENDA) 5 MG tablet Take 1 tablet (5 mg total) by mouth daily.   metoprolol tartrate (LOPRESSOR) 25 MG tablet Take 1 tablet (25 mg total) by mouth 2 (two) times daily.   Multiple Vitamin (MULTIVITAMIN WITH MINERALS) TABS tablet Take 1 tablet by mouth daily.   nitroGLYCERIN (NITROSTAT) 0.4 MG SL tablet PLACE ONE TABLET UNDER THE TONGUE EVERY 5 MINUTES AS NEEDED FOR CHEST PAIN.   oxybutynin (DITROPAN) 5 MG tablet Take 0.5 tablets (2.5 mg total) by mouth 2 (two) times daily.   pantoprazole (PROTONIX) 40 MG tablet Take 1 tablet (40 mg total) by mouth 2 (two) times daily.   polyethylene glycol (MIRALAX / GLYCOLAX) packet Take 17 g by mouth daily as needed for mild constipation.   Potassium Chloride ER 20 MEQ TBCR Take 2  Chronic Care Management   CCM RN Visit Note  02/14/2021 Name: Sara Fox MRN: [id number] DOB: 10/12/1940  Subjective: Sara Fox is a 80 y.o. year old female who is a primary care patient of Moore, Janece, FNP. The care management team was consulted for assistance with disease management and care coordination needs.    Engaged with patient by telephone for follow up visit in response to provider referral for case management and/or care coordination services.   Consent to Services:  The patient was given information about Chronic Care Management services, agreed to services, and gave verbal consent prior to initiation of services.  Please see initial visit note for detailed documentation.   Patient agreed to services and verbal consent obtained.   Assessment: Review of patient past medical history, allergies, medications, health status, including review of consultants reports, laboratory and other test data, was performed as part of comprehensive evaluation and provision of chronic care management services.   SDOH (Social Determinants of Health) assessments and interventions performed:  yes  CCM Care Plan  Allergies  Allergen Reactions   Buprenorphine Hcl Shortness Of Breath    Throat swelling/trouble breathing and lethargic   Morphine And Related Shortness Of Breath    Throat swelling/trouble breathing and lethargic   Celebrex [Celecoxib] Diarrhea and Swelling    Swelling of legs    Vioxx [Rofecoxib] Palpitations   Codeine Other (See Comments)    Bloated     Outpatient Encounter Medications as of 02/14/2021  Medication Sig   aspirin EC 81 MG tablet Take 81 mg by mouth daily as needed (headache).   budesonide-formoterol (SYMBICORT) 160-4.5 MCG/ACT inhaler TAKE 2 PUFFS BY MOUTH TWICE A DAY   Calcium Citrate-Vitamin D (CALCIUM CITRATE+D3 PETITES PO) Take 1 tablet by mouth daily.   cholecalciferol (VITAMIN D) 25 MCG (1000 UNIT) tablet Take 1,000 Units by mouth daily.    ciclopirox (PENLAC) 8 % solution Apply 1 application topically at bedtime. Apply over nail and surrounding skin. Apply daily over previous coat. After seven (7) days, file nail and continue cycle.   [employer]S MAGNESIUM OXIDE 250 MG TABS TAKE 1 TABLET BY MOUTH WITH EVENING MEAL DAILY   donepezil (ARICEPT) 10 MG tablet Take 1 tablet (10 mg total) by mouth at bedtime.   ferrous sulfate 325 (65 FE) MG tablet Take 325 mg by mouth daily with breakfast.   furosemide (LASIX) 20 MG tablet Take 1 tablet by mouth Mon - Fri daily   gabapentin (NEURONTIN) 100 MG capsule Take 1 capsule (100 mg total) by mouth 2 (two) times daily.   ibuprofen (ADVIL) 200 MG tablet Take 400 mg by mouth every 6 (six) hours as needed (pain).   memantine (NAMENDA) 5 MG tablet Take 1 tablet (5 mg total) by mouth daily.   metoprolol tartrate (LOPRESSOR) 25 MG tablet Take 1 tablet (25 mg total) by mouth 2 (two) times daily.   Multiple Vitamin (MULTIVITAMIN WITH MINERALS) TABS tablet Take 1 tablet by mouth daily.   nitroGLYCERIN (NITROSTAT) 0.4 MG SL tablet PLACE ONE TABLET UNDER THE TONGUE EVERY 5 MINUTES AS NEEDED FOR CHEST PAIN.   oxybutynin (DITROPAN) 5 MG tablet Take 0.5 tablets (2.5 mg total) by mouth 2 (two) times daily.   pantoprazole (PROTONIX) 40 MG tablet Take 1 tablet (40 mg total) by mouth 2 (two) times daily.   polyethylene glycol (MIRALAX / GLYCOLAX) packet Take 17 g by mouth daily as needed for mild constipation.   Potassium Chloride ER 20 MEQ TBCR Take 2   Chronic Care Management   CCM RN Visit Note  02/14/2021 Name: Sara Fox MRN: [id number] DOB: 10/12/1940  Subjective: Sara Fox is a 80 y.o. year old female who is a primary care patient of Moore, Janece, FNP. The care management team was consulted for assistance with disease management and care coordination needs.    Engaged with patient by telephone for follow up visit in response to provider referral for case management and/or care coordination services.   Consent to Services:  The patient was given information about Chronic Care Management services, agreed to services, and gave verbal consent prior to initiation of services.  Please see initial visit note for detailed documentation.   Patient agreed to services and verbal consent obtained.   Assessment: Review of patient past medical history, allergies, medications, health status, including review of consultants reports, laboratory and other test data, was performed as part of comprehensive evaluation and provision of chronic care management services.   SDOH (Social Determinants of Health) assessments and interventions performed:  yes  CCM Care Plan  Allergies  Allergen Reactions   Buprenorphine Hcl Shortness Of Breath    Throat swelling/trouble breathing and lethargic   Morphine And Related Shortness Of Breath    Throat swelling/trouble breathing and lethargic   Celebrex [Celecoxib] Diarrhea and Swelling    Swelling of legs    Vioxx [Rofecoxib] Palpitations   Codeine Other (See Comments)    Bloated     Outpatient Encounter Medications as of 02/14/2021  Medication Sig   aspirin EC 81 MG tablet Take 81 mg by mouth daily as needed (headache).   budesonide-formoterol (SYMBICORT) 160-4.5 MCG/ACT inhaler TAKE 2 PUFFS BY MOUTH TWICE A DAY   Calcium Citrate-Vitamin D (CALCIUM CITRATE+D3 PETITES PO) Take 1 tablet by mouth daily.   cholecalciferol (VITAMIN D) 25 MCG (1000 UNIT) tablet Take 1,000 Units by mouth daily.    ciclopirox (PENLAC) 8 % solution Apply 1 application topically at bedtime. Apply over nail and surrounding skin. Apply daily over previous coat. After seven (7) days, file nail and continue cycle.   [employer] MAGNESIUM OXIDE 250 MG TABS TAKE 1 TABLET BY MOUTH WITH EVENING MEAL DAILY   donepezil (ARICEPT) 10 MG tablet Take 1 tablet (10 mg total) by mouth at bedtime.   ferrous sulfate 325 (65 FE) MG tablet Take 325 mg by mouth daily with breakfast.   furosemide (LASIX) 20 MG tablet Take 1 tablet by mouth Mon - Fri daily   gabapentin (NEURONTIN) 100 MG capsule Take 1 capsule (100 mg total) by mouth 2 (two) times daily.   ibuprofen (ADVIL) 200 MG tablet Take 400 mg by mouth every 6 (six) hours as needed (pain).   memantine (NAMENDA) 5 MG tablet Take 1 tablet (5 mg total) by mouth daily.   metoprolol tartrate (LOPRESSOR) 25 MG tablet Take 1 tablet (25 mg total) by mouth 2 (two) times daily.   Multiple Vitamin (MULTIVITAMIN WITH MINERALS) TABS tablet Take 1 tablet by mouth daily.   nitroGLYCERIN (NITROSTAT) 0.4 MG SL tablet PLACE ONE TABLET UNDER THE TONGUE EVERY 5 MINUTES AS NEEDED FOR CHEST PAIN.   oxybutynin (DITROPAN) 5 MG tablet Take 0.5 tablets (2.5 mg total) by mouth 2 (two) times daily.   pantoprazole (PROTONIX) 40 MG tablet Take 1 tablet (40 mg total) by mouth 2 (two) times daily.   polyethylene glycol (MIRALAX / GLYCOLAX) packet Take 17 g by mouth daily as needed for mild constipation.   Potassium Chloride ER 20 MEQ TBCR Take 2

## 2021-02-21 NOTE — Patient Instructions (Signed)
Visit Information  PATIENT GOALS:  Goals Addressed      Optimal Cognitive Function   On track    Timeframe:  Long-Range Goal Priority:  Medium Start Date: 07/26/20                             Expected End Date: 07/26/21                     Next Follow Up date: 03/24/21  Patient Goals: - continue to work with embedded BSW for long term care planning  - complete Medicaid application as soon as possible  - consider [facility] and or [place]  - contact Neurologist to schedule a follow up concerning pharmacological recommendations for agitation     The patient verbalized understanding of instructions, educational materials, and care plan provided today and declined offer to receive copy of patient instructions, educational materials, and care plan.   Telephone follow up appointment with care management team member scheduled for: 03/24/21  Barb Merino, RN, BSN, CCM Care Management Coordinator [place] [facility]  Direct Phone: [phone]

## 2021-02-21 NOTE — Patient Instructions (Signed)
Visit Information  PATIENT GOALS:  Goals Addressed      Follow My Treatment Plan-Chronic Kidney   On track    Timeframe:  Long-Range Goal Priority:  Medium Start Date: 07/26/20                           Expected End Date:  07/26/21                 Follow Up Date: 03/24/21  - increase water intake to 64 oz daily - monitor for and report signs/symptoms of UTI and seek treatment promptly if occurs    Why is this important?   Staying as healthy as you can is very important. This may mean making changes if you smoke, don't exercise or eat poorly.  A healthy lifestyle is an important goal for you.  Following the treatment plan and making changes may be hard.  Try some of these steps to help keep the disease from getting worse.     Notes:          Optimal Cognitive Function   On track    Timeframe:  Long-Range Goal Priority:  Medium Start Date: 07/26/20                             Expected End Date: 07/26/21                     Next Follow Up date: 03/24/21  Patient Goals: - continue to work with embedded BSW for long term care planning  - complete Medicaid application as soon as possible  - consider [facility] and or [place]      The patient verbalized understanding of instructions, educational materials, and care plan provided today and declined offer to receive copy of patient instructions, educational materials, and care plan.   Telephone follow up appointment with care management team member scheduled for: 03/24/21  Barb Merino, RN, BSN, CCM Care Management Coordinator [place] [facility]  Direct Phone: [phone]

## 2021-02-23 ENCOUNTER — Ambulatory Visit: Payer: Medicare Other

## 2021-02-23 DIAGNOSIS — N1831 Chronic kidney disease, stage 3a: Secondary | ICD-10-CM

## 2021-02-23 DIAGNOSIS — I10 Essential (primary) hypertension: Secondary | ICD-10-CM

## 2021-02-23 DIAGNOSIS — F02818 Dementia in other diseases classified elsewhere, unspecified severity, with other behavioral disturbance: Secondary | ICD-10-CM

## 2021-02-23 DIAGNOSIS — I5032 Chronic diastolic (congestive) heart failure: Secondary | ICD-10-CM

## 2021-02-23 NOTE — Chronic Care Management (AMB) (Signed)
Chronic Care Management    Social Work Note  02/23/2021 Name: Sara Fox MRN: [id number] DOB: 09-Jul-1940  Sara Fox is a 80 y.o. year old female who is a primary care patient of [name], [place]. The CCM team was consulted to assist the patient with chronic disease management and/or care coordination needs related to:  HTN, CHF, CKD III, Alzheimer's Disease .   Engaged with patient son by phone  for follow up visit in response to provider referral for social work chronic care management and care coordination services.   Consent to Services:  The patient was given information about Chronic Care Management services, agreed to services, and gave verbal consent prior to initiation of services.  Please see initial visit note for detailed documentation.   Patient agreed to services and consent obtained.   Assessment: Review of patient past medical history, allergies, medications, and health status, including review of relevant consultants reports was performed today as part of a comprehensive evaluation and provision of chronic care management and care coordination services.     SDOH (Social Determinants of Health) assessments and interventions performed:    Advanced Directives Status: Not addressed in this encounter.  CCM Care Plan  Allergies  Allergen Reactions   Buprenorphine Hcl Shortness Of Breath    Throat swelling/trouble breathing and lethargic   Morphine And Related Shortness Of Breath    Throat swelling/trouble breathing and lethargic   Celebrex [Celecoxib] Diarrhea and Swelling    Swelling of legs    Vioxx [Rofecoxib] Palpitations   Codeine Other (See Comments)    Bloated     Outpatient Encounter Medications as of 02/23/2021  Medication Sig   aspirin EC 81 MG tablet Take 81 mg by mouth daily as needed (headache).   budesonide-formoterol (SYMBICORT) 160-4.5 MCG/ACT inhaler TAKE 2 PUFFS BY MOUTH TWICE A DAY   Calcium Citrate-Vitamin D (CALCIUM CITRATE+D3 PETITES  PO) Take 1 tablet by mouth daily.   cholecalciferol (VITAMIN D) 25 MCG (1000 UNIT) tablet Take 1,000 Units by mouth daily.   ciclopirox (PENLAC) 8 % solution Apply 1 application topically at bedtime. Apply over nail and surrounding skin. Apply daily over previous coat. After seven (7) days, file nail and continue cycle.   [employer] MAGNESIUM OXIDE 250 MG TABS TAKE 1 TABLET BY MOUTH WITH EVENING MEAL DAILY   donepezil (ARICEPT) 10 MG tablet Take 1 tablet (10 mg total) by mouth at bedtime.   ferrous sulfate 325 (65 FE) MG tablet Take 325 mg by mouth daily with breakfast.   furosemide (LASIX) 20 MG tablet Take 1 tablet by mouth Mon - Fri daily   gabapentin (NEURONTIN) 100 MG capsule Take 1 capsule (100 mg total) by mouth 2 (two) times daily.   ibuprofen (ADVIL) 200 MG tablet Take 400 mg by mouth every 6 (six) hours as needed (pain).   memantine (NAMENDA) 5 MG tablet Take 1 tablet (5 mg total) by mouth daily.   metoprolol tartrate (LOPRESSOR) 25 MG tablet Take 1 tablet (25 mg total) by mouth 2 (two) times daily.   Multiple Vitamin (MULTIVITAMIN WITH MINERALS) TABS tablet Take 1 tablet by mouth daily.   nitroGLYCERIN (NITROSTAT) 0.4 MG SL tablet PLACE ONE TABLET UNDER THE TONGUE EVERY 5 MINUTES AS NEEDED FOR CHEST PAIN.   oxybutynin (DITROPAN) 5 MG tablet Take 0.5 tablets (2.5 mg total) by mouth 2 (two) times daily.   pantoprazole (PROTONIX) 40 MG tablet Take 1 tablet (40 mg total) by mouth 2 (two) times daily.  polyethylene glycol (MIRALAX / GLYCOLAX) packet Take 17 g by mouth daily as needed for mild constipation.   Potassium Chloride ER 20 MEQ TBCR Take 2 tablets by mouth every morning.   QUEtiapine (SEROQUEL) 50 MG tablet TAKE 1 TABLET BY MOUTH EVERYDAY AT BEDTIME   rosuvastatin (CRESTOR) 40 MG tablet Take 1 tablet (40 mg total) by mouth daily.   traMADol (ULTRAM) 50 MG tablet TAKE 1 TABLET 3 TIMES A DAY AND CAN TAKE 1 EXTRA 20 DAYS/MONTH   No facility-administered encounter medications on file as of  02/23/2021.    Patient Active Problem List   Diagnosis Date Noted   Fall (on)(from) incline, initial encounter 11/23/2020   Macrocytosis without anemia 11/22/2020   Unwitnessed fall 11/22/2020   Late onset Alzheimer's dementia with behavioral disturbance ([place]) 11/18/2020   Visual hallucination 11/18/2020   Sundowning 11/18/2020   Non-small cell carcinoma of right lung, stage 1 ([place]) 06/08/2020   Pulmonary nodule 1 cm or greater in diameter 05/21/2020   Atherosclerotic heart disease of native coronary artery with other forms of angina pectoris ([place]) 04/22/2020   Memory loss 02/18/2019   Vitamin B12 deficiency 10/23/2018   Stage 3 chronic kidney disease ([place]) 06/03/2018   Chronic anemia 06/03/2018   Hypercholesterolemia 06/03/2018   Malnutrition of moderate degree 07/12/2017   Hypoalbuminemia 07/11/2017   Elevated LFTs 07/11/2017   Snoring 12/21/2016   Peripheral musculoskeletal gait disorder 02/05/2015   Lumbar post-laminectomy syndrome 02/05/2015   CAD in native artery 10/16/2014   NSTEMI (non-ST elevated myocardial infarction) ([place]) 09/13/2014   Cardiomyopathy, ischemic 02/54/2706   Chronic systolic heart failure ([place])    Tobacco abuse 09/12/2014   Dyslipidemia 09/12/2014   Normocytic anemia 09/12/2014   Chronic diastolic heart failure, NYHA class 1 ([place]) 09/12/2014   Postlaminectomy syndrome, cervical region 08/01/2013   Chronic midline low back pain with left-sided sciatica 08/01/2013   Shoulder joint contracture 08/01/2013   HTN (hypertension) 03/03/2013   Abnormal genetic test 03/03/2013    Conditions to be addressed/monitored: CHF, HTN, CKD Stage III, and Dementia; Level of care concerns  Care Plan : Social Work [facility] Care Plan  Updates made by [name] since 02/23/2021 12:00 AM     Problem: Disease Progression      Goal: Disease Progression Managed   Start Date: 11/12/2020  Priority: High  Note:   Current Barriers:  Chronic disease management support  and education needs related to CHF, HTN, CKD Stage III, and Mild Cognitive Impairment   Increased Agitation  Social Worker Clinical Goal(s):  patient will work with SW to identify and address any acute and/or chronic care coordination needs related to the self health management of CHF, HTN, CKD Stage III, and Mild Cognitive Impairment   Patient and her son will work with SW to identify long term care options Patient, with the help of her son, will visit the ED to evaluate cause of increased agitation and combativeness Goal not met  SW Interventions:  Inter-disciplinary care team collaboration (see longitudinal plan of care) Collaboration with [name], [place] regarding development and update of comprehensive plan of care as evidenced by provider attestation and co-signature Collaboration with [place] who indicates she has spoken with patient son [name] who did receive a home visit with APS on Friday October 7 - discussed [name] is in process of attempting to switch patient care in the home to [place] for easier access to medical treatment due to barriers associated with office visits  Successful outbound call placed to [name] to confirm receipt of mailed Medicaid application - [name] because angry stating "you sent me the wrong thing. I told you I wanted a Medicaid application and you sent me a long term care application" Advised [name] SW sent a Medicaid application for him to apply for long term care Medicaid on behalf of the patient due to his reported desire during 02/14/21 call to place the patient due to inability to safely care for her in the home Attempted to assess if [name] is still interested in placing the patient - [name] refused to answer the questions stating "I am trying my best and ya'll keep calling APS" Advised [name] that a Medicaid application is the same regardless which type is desired, the difference is he would need to  indicate on the application the type of Medicaid desired in order to have the application filed correctly Informed [name] that if he is no longer interested in placing the patient in long term care there is no reason to complete a Medicaid application as the patient already has partial Medicaid and does not qualify for full benefits due to income Attempted to assess for patient engagement with [facility] advising [name] that this SW has communicated with [place] who has updated SW on plan to switch to [facility] if [facility] will approve provider  Informed [name] RN Care Manager will be on vacation the remainder of the week and SW would like to assess progress. [name] stated "I will wait for [facility] Haring to come back" Encouraged [name] to contact SW as needed Collaborated with [name] FNP and [place] advising of outcome of today's call and Mr. Virginia Crews reluctance to engage with SW - advised this SW will plan to close goals once outcome of APS referral is determined due to inability to effectively engage at this time  Patient Goals/Self-Care Activities patient will: with the help of her son  -  Engage with home health -Call 911 for assistance with increased agitation -Complete and submit Medicaid application -Contact SW as needed prior to next scheduled call  Follow Up Plan:  SW will follow up with RN Care Manager over the next 10 days to discuss case       Follow Up Plan:  SW will continue to follow      [name], BSW, CDP Social Worker, Certified Dementia Practitioner [place] [facility] [place] [facility] [phone]

## 2021-02-23 NOTE — Patient Instructions (Signed)
Social Worker Visit Information  Goals we discussed today:   Goals Addressed             This Visit's Progress    Disease Progression Managed       Timeframe:  Short-Term Goal Priority:  High Start Date:  7.1.22                                            Patient Goals/Self-Care Activities patient will: with the help of her son  - Engage with home health -Call 911 for assistance with increased agitation -Complete and submit Medicaid application -Contact SW as needed prior to next scheduled call         Materials Provided: No: Patient declined  The patient verbalized understanding of instructions, educational materials, and care plan provided today and declined offer to receive copy of patient instructions, educational materials, and care plan.   Follow Up Plan:  SW will follow up with RN Care Manager regarding plan of care over the next 10 days   [name], BSW, CDP Social Worker, Certified Dementia Practitioner [place] [facility] [place] [facility] [phone]

## 2021-02-24 ENCOUNTER — Encounter: Payer: Medicare Other | Admitting: Physical Medicine & Rehabilitation

## 2021-02-24 DIAGNOSIS — I110 Hypertensive heart disease with heart failure: Secondary | ICD-10-CM | POA: Diagnosis not present

## 2021-02-24 DIAGNOSIS — K219 Gastro-esophageal reflux disease without esophagitis: Secondary | ICD-10-CM | POA: Diagnosis not present

## 2021-02-24 DIAGNOSIS — Z9181 History of falling: Secondary | ICD-10-CM | POA: Diagnosis not present

## 2021-02-24 DIAGNOSIS — Z79891 Long term (current) use of opiate analgesic: Secondary | ICD-10-CM | POA: Diagnosis not present

## 2021-02-24 DIAGNOSIS — I509 Heart failure, unspecified: Secondary | ICD-10-CM | POA: Diagnosis not present

## 2021-02-24 DIAGNOSIS — G301 Alzheimer's disease with late onset: Secondary | ICD-10-CM | POA: Diagnosis not present

## 2021-02-24 DIAGNOSIS — E7800 Pure hypercholesterolemia, unspecified: Secondary | ICD-10-CM | POA: Diagnosis not present

## 2021-02-24 DIAGNOSIS — Z7951 Long term (current) use of inhaled steroids: Secondary | ICD-10-CM | POA: Diagnosis not present

## 2021-02-24 DIAGNOSIS — N183 Chronic kidney disease, stage 3 unspecified: Secondary | ICD-10-CM | POA: Diagnosis not present

## 2021-02-24 DIAGNOSIS — J449 Chronic obstructive pulmonary disease, unspecified: Secondary | ICD-10-CM | POA: Diagnosis not present

## 2021-02-24 DIAGNOSIS — F02811 Dementia in other diseases classified elsewhere, unspecified severity, with agitation: Secondary | ICD-10-CM | POA: Diagnosis not present

## 2021-02-24 DIAGNOSIS — I252 Old myocardial infarction: Secondary | ICD-10-CM | POA: Diagnosis not present

## 2021-02-24 DIAGNOSIS — I2510 Atherosclerotic heart disease of native coronary artery without angina pectoris: Secondary | ICD-10-CM | POA: Diagnosis not present

## 2021-02-25 ENCOUNTER — Other Ambulatory Visit: Payer: Self-pay

## 2021-02-25 ENCOUNTER — Telehealth: Payer: Self-pay | Admitting: Nurse Practitioner

## 2021-02-25 NOTE — Telephone Encounter (Signed)
Called son yesterday to inform the patient did not urinate in the cup she had soap and water. I have called [facility] to get a urinalysis and urine culture. I expressed to the son again that we are trying to ensure the patient and he have what she needs. Left message for Otila Kluver [place] for verbal order

## 2021-02-26 ENCOUNTER — Encounter (HOSPITAL_COMMUNITY): Payer: Self-pay | Admitting: Emergency Medicine

## 2021-02-26 ENCOUNTER — Other Ambulatory Visit: Payer: Self-pay

## 2021-02-26 ENCOUNTER — Emergency Department (HOSPITAL_COMMUNITY): Payer: Medicare Other

## 2021-02-26 ENCOUNTER — Observation Stay (HOSPITAL_COMMUNITY)
Admission: EM | Admit: 2021-02-26 | Discharge: 2021-03-05 | Disposition: A | Discharge status: Home / Self Care | Payer: Medicare Other | Attending: Internal Medicine | Admitting: Internal Medicine

## 2021-02-26 DIAGNOSIS — G301 Alzheimer's disease with late onset: Secondary | ICD-10-CM | POA: Diagnosis present

## 2021-02-26 DIAGNOSIS — I10 Essential (primary) hypertension: Secondary | ICD-10-CM | POA: Diagnosis present

## 2021-02-26 DIAGNOSIS — C3491 Malignant neoplasm of unspecified part of right bronchus or lung: Secondary | ICD-10-CM | POA: Diagnosis present

## 2021-02-26 DIAGNOSIS — R2689 Other abnormalities of gait and mobility: Principal | ICD-10-CM | POA: Insufficient documentation

## 2021-02-26 DIAGNOSIS — Z8541 Personal history of malignant neoplasm of cervix uteri: Secondary | ICD-10-CM | POA: Insufficient documentation

## 2021-02-26 DIAGNOSIS — I5042 Chronic combined systolic (congestive) and diastolic (congestive) heart failure: Secondary | ICD-10-CM | POA: Diagnosis not present

## 2021-02-26 DIAGNOSIS — F17210 Nicotine dependence, cigarettes, uncomplicated: Secondary | ICD-10-CM | POA: Insufficient documentation

## 2021-02-26 DIAGNOSIS — F02818 Dementia in other diseases classified elsewhere, unspecified severity, with other behavioral disturbance: Secondary | ICD-10-CM | POA: Diagnosis present

## 2021-02-26 DIAGNOSIS — Z79899 Other long term (current) drug therapy: Secondary | ICD-10-CM | POA: Insufficient documentation

## 2021-02-26 DIAGNOSIS — D649 Anemia, unspecified: Secondary | ICD-10-CM | POA: Diagnosis present

## 2021-02-26 DIAGNOSIS — D631 Anemia in chronic kidney disease: Secondary | ICD-10-CM | POA: Insufficient documentation

## 2021-02-26 DIAGNOSIS — Z7982 Long term (current) use of aspirin: Secondary | ICD-10-CM | POA: Insufficient documentation

## 2021-02-26 DIAGNOSIS — I5022 Chronic systolic (congestive) heart failure: Secondary | ICD-10-CM | POA: Diagnosis present

## 2021-02-26 DIAGNOSIS — R7401 Elevation of levels of liver transaminase levels: Secondary | ICD-10-CM | POA: Diagnosis present

## 2021-02-26 DIAGNOSIS — I5032 Chronic diastolic (congestive) heart failure: Secondary | ICD-10-CM | POA: Diagnosis present

## 2021-02-26 DIAGNOSIS — Z85118 Personal history of other malignant neoplasm of bronchus and lung: Secondary | ICD-10-CM | POA: Insufficient documentation

## 2021-02-26 DIAGNOSIS — R269 Unspecified abnormalities of gait and mobility: Secondary | ICD-10-CM

## 2021-02-26 DIAGNOSIS — I130 Hypertensive heart and chronic kidney disease with heart failure and stage 1 through stage 4 chronic kidney disease, or unspecified chronic kidney disease: Secondary | ICD-10-CM | POA: Insufficient documentation

## 2021-02-26 DIAGNOSIS — R270 Ataxia, unspecified: Secondary | ICD-10-CM | POA: Diagnosis not present

## 2021-02-26 DIAGNOSIS — N183 Chronic kidney disease, stage 3 unspecified: Secondary | ICD-10-CM | POA: Diagnosis present

## 2021-02-26 DIAGNOSIS — D7589 Other specified diseases of blood and blood-forming organs: Secondary | ICD-10-CM | POA: Diagnosis present

## 2021-02-26 DIAGNOSIS — Z20822 Contact with and (suspected) exposure to covid-19: Secondary | ICD-10-CM | POA: Insufficient documentation

## 2021-02-26 DIAGNOSIS — R519 Headache, unspecified: Secondary | ICD-10-CM | POA: Diagnosis not present

## 2021-02-26 DIAGNOSIS — R531 Weakness: Secondary | ICD-10-CM | POA: Diagnosis not present

## 2021-02-26 DIAGNOSIS — Z01818 Encounter for other preprocedural examination: Secondary | ICD-10-CM | POA: Diagnosis not present

## 2021-02-26 DIAGNOSIS — Z720 Tobacco use: Secondary | ICD-10-CM | POA: Diagnosis present

## 2021-02-26 DIAGNOSIS — E785 Hyperlipidemia, unspecified: Secondary | ICD-10-CM | POA: Diagnosis present

## 2021-02-26 DIAGNOSIS — F05 Delirium due to known physiological condition: Secondary | ICD-10-CM | POA: Diagnosis present

## 2021-02-26 DIAGNOSIS — K219 Gastro-esophageal reflux disease without esophagitis: Secondary | ICD-10-CM | POA: Diagnosis present

## 2021-02-26 DIAGNOSIS — R262 Difficulty in walking, not elsewhere classified: Secondary | ICD-10-CM | POA: Diagnosis present

## 2021-02-26 LAB — COMPREHENSIVE METABOLIC PANEL
ALT: 54 U/L — ABNORMAL HIGH (ref 0–44)
AST: 43 U/L — ABNORMAL HIGH (ref 15–41)
Albumin: 3.1 g/dL — ABNORMAL LOW (ref 3.5–5.0)
Alkaline Phosphatase: 155 U/L — ABNORMAL HIGH (ref 38–126)
Anion gap: 9 (ref 5–15)
BUN: 37 mg/dL — ABNORMAL HIGH (ref 8–23)
CO2: 27 mmol/L (ref 22–32)
Calcium: 9.3 mg/dL (ref 8.9–10.3)
Chloride: 105 mmol/L (ref 98–111)
Creatinine, Ser: 1.2 mg/dL — ABNORMAL HIGH (ref 0.44–1.00)
GFR, Estimated: 45 mL/min — ABNORMAL LOW (ref >60)
Glucose, Bld: 139 mg/dL — ABNORMAL HIGH (ref 70–99)
Potassium: 4.8 mmol/L (ref 3.5–5.1)
Sodium: 141 mmol/L (ref 135–145)
Total Bilirubin: 0.5 mg/dL (ref 0.3–1.2)
Total Protein: 6.4 g/dL — ABNORMAL LOW (ref 6.5–8.1)

## 2021-02-26 LAB — CBC WITH DIFFERENTIAL/PLATELET
Abs Immature Granulocytes: 0.01 10*3/uL (ref 0.00–0.07)
Basophils Absolute: 0 10*3/uL (ref 0.0–0.1)
Basophils Relative: 0 %
Eosinophils Absolute: 0 10*3/uL (ref 0.0–0.5)
Eosinophils Relative: 0 %
HCT: 35.9 % — ABNORMAL LOW (ref 36.0–46.0)
Hemoglobin: 10.6 g/dL — ABNORMAL LOW (ref 12.0–15.0)
Immature Granulocytes: 0 %
Lymphocytes Relative: 11 %
Lymphs Abs: 0.6 10*3/uL — ABNORMAL LOW (ref 0.7–4.0)
MCH: 29.7 pg (ref 26.0–34.0)
MCHC: 29.5 g/dL — ABNORMAL LOW (ref 30.0–36.0)
MCV: 100.6 fL — ABNORMAL HIGH (ref 80.0–100.0)
Monocytes Absolute: 0.8 10*3/uL (ref 0.1–1.0)
Monocytes Relative: 14 %
Neutro Abs: 4.3 10*3/uL (ref 1.7–7.7)
Neutrophils Relative %: 75 %
Platelets: 317 10*3/uL (ref 150–400)
RBC: 3.57 MIL/uL — ABNORMAL LOW (ref 3.87–5.11)
RDW: 13.4 % (ref 11.5–15.5)
WBC: 5.8 10*3/uL (ref 4.0–10.5)
nRBC: 0 % (ref 0.0–0.2)

## 2021-02-26 LAB — TROPONIN I (HIGH SENSITIVITY)
Troponin I (High Sensitivity): 8 ng/L (ref <18)
Troponin I (High Sensitivity): 6 ng/L (ref <18)

## 2021-02-26 NOTE — ED Triage Notes (Signed)
Son reports pt fell out of chair on Thursday.  States she has had unsteady gait and now having to walk with a cane.  He states he took her to PCP because he thought she had a UTI and she put soap and water in the cup and didn't give a urine specimen.  Pt reports generalized weakness all over and headache.  He states she dropped a cup of coffee that he handed her.  Pt has a slight arm drift bilaterally and more difficulty raising RLE.

## 2021-02-26 NOTE — ED Provider Notes (Signed)
Emergency Medicine Provider Triage Evaluation Note  Sara Fox , a 80 y.o. female  was evaluated in triage.  Pt complains of altered mental status for the past 3 days. Family member reports for the past 3 days she has had to use a cane to walk which is atypical for her. He reports generalized weakness and pt complaining of headaches. Family member concerned that she had a UTI as she has been urinating more frequently as well. He states she fell out of her chair 3 days ago and since that time has had to use a cane and has been staggering while walking. Pt complains of weakness all over.   Review of Systems  Positive: + generalized weakness, staggering gate, confusion, urinary frequency Negative: - unilateral weakness  Physical Exam  BP 111/66 (BP Location: Right Arm)   Pulse 66   Temp 97.8 F (36.6 C) (Oral)   Resp 14   SpO2 100%  Gen:   Awake, no distress   Resp:  Normal effort  MSK:   Moves extremities without difficulty  Other:  Strength 3/5 to RLE. 4/5 to LLE. Strength equal to BUEs.   Medical Decision Making  Medically screening exam initiated at 6:54 PM.  Appropriate orders placed.  [name] was informed that the remainder of the evaluation will be completed by another provider, this initial triage assessment does not replace that evaluation, and the importance of remaining in the ED until their evaluation is complete.     Eustaquio Maize, PA-C 02/26/21 1857    Davonna Belling, MD 02/26/21 2046

## 2021-02-27 ENCOUNTER — Observation Stay (HOSPITAL_COMMUNITY): Payer: Medicare Other

## 2021-02-27 ENCOUNTER — Emergency Department (HOSPITAL_COMMUNITY): Payer: Medicare Other

## 2021-02-27 DIAGNOSIS — M48061 Spinal stenosis, lumbar region without neurogenic claudication: Secondary | ICD-10-CM | POA: Diagnosis not present

## 2021-02-27 DIAGNOSIS — R29818 Other symptoms and signs involving the nervous system: Secondary | ICD-10-CM | POA: Diagnosis not present

## 2021-02-27 DIAGNOSIS — Z01818 Encounter for other preprocedural examination: Secondary | ICD-10-CM | POA: Diagnosis not present

## 2021-02-27 DIAGNOSIS — I10 Essential (primary) hypertension: Secondary | ICD-10-CM | POA: Diagnosis not present

## 2021-02-27 DIAGNOSIS — R262 Difficulty in walking, not elsewhere classified: Secondary | ICD-10-CM | POA: Diagnosis present

## 2021-02-27 DIAGNOSIS — M4319 Spondylolisthesis, multiple sites in spine: Secondary | ICD-10-CM | POA: Diagnosis not present

## 2021-02-27 DIAGNOSIS — R269 Unspecified abnormalities of gait and mobility: Secondary | ICD-10-CM | POA: Diagnosis not present

## 2021-02-27 DIAGNOSIS — R270 Ataxia, unspecified: Secondary | ICD-10-CM | POA: Diagnosis not present

## 2021-02-27 DIAGNOSIS — K219 Gastro-esophageal reflux disease without esophagitis: Secondary | ICD-10-CM | POA: Diagnosis present

## 2021-02-27 DIAGNOSIS — R7401 Elevation of levels of liver transaminase levels: Secondary | ICD-10-CM | POA: Diagnosis present

## 2021-02-27 DIAGNOSIS — M4807 Spinal stenosis, lumbosacral region: Secondary | ICD-10-CM | POA: Diagnosis not present

## 2021-02-27 LAB — URINALYSIS, ROUTINE W REFLEX MICROSCOPIC
Bilirubin Urine: NEGATIVE
Glucose, UA: NEGATIVE mg/dL
Hgb urine dipstick: NEGATIVE
Ketones, ur: NEGATIVE mg/dL
Leukocytes,Ua: NEGATIVE
Nitrite: NEGATIVE
Protein, ur: NEGATIVE mg/dL
Specific Gravity, Urine: 1.016 (ref 1.005–1.030)
pH: 7 (ref 5.0–8.0)

## 2021-02-27 LAB — I-STAT VENOUS BLOOD GAS, ED
Acid-Base Excess: 3 mmol/L — ABNORMAL HIGH (ref 0.0–2.0)
Bicarbonate: 27.3 mmol/L (ref 20.0–28.0)
Calcium, Ion: 1.22 mmol/L (ref 1.15–1.40)
HCT: 31 % — ABNORMAL LOW (ref 36.0–46.0)
Hemoglobin: 10.5 g/dL — ABNORMAL LOW (ref 12.0–15.0)
O2 Saturation: 94 %
Potassium: 4.7 mmol/L (ref 3.5–5.1)
Sodium: 141 mmol/L (ref 135–145)
TCO2: 28 mmol/L (ref 22–32)
pCO2, Ven: 39.9 mmHg — ABNORMAL LOW (ref 44.0–60.0)
pH, Ven: 7.443 — ABNORMAL HIGH (ref 7.250–7.430)
pO2, Ven: 67 mmHg — ABNORMAL HIGH (ref 32.0–45.0)

## 2021-02-27 LAB — RESP PANEL BY RT-PCR (FLU A&B, COVID) ARPGX2
Influenza A by PCR: NEGATIVE
Influenza B by PCR: NEGATIVE
SARS Coronavirus 2 by RT PCR: NEGATIVE

## 2021-02-27 LAB — CBG MONITORING, ED: Glucose-Capillary: 83 mg/dL (ref 70–99)

## 2021-02-27 LAB — BRAIN NATRIURETIC PEPTIDE: B Natriuretic Peptide: 63.7 pg/mL (ref 0.0–100.0)

## 2021-02-27 LAB — AMMONIA: Ammonia: 27 umol/L (ref 9–35)

## 2021-02-27 MED ORDER — FUROSEMIDE 20 MG PO TABS: 20.0000 mg | ORAL_TABLET | ORAL | Status: DC

## 2021-02-27 MED ORDER — MOMETASONE FURO-FORMOTEROL FUM 200-5 MCG/ACT IN AERO
2.0000 | INHALATION_SPRAY | Freq: Two times a day (BID) | RESPIRATORY_TRACT | Status: DC
  Administered 2021-02-28 – 2021-03-05 (×9): 2 via RESPIRATORY_TRACT
  Filled 2021-02-27: qty 8.8

## 2021-02-27 MED ORDER — OXYBUTYNIN CHLORIDE 5 MG PO TABS
2.5000 mg | ORAL_TABLET | Freq: Two times a day (BID) | ORAL | Status: DC
  Administered 2021-02-28 – 2021-03-05 (×10): 2.5 mg via ORAL
  Filled 2021-02-27 (×10): qty 1

## 2021-02-27 MED ORDER — PANTOPRAZOLE SODIUM 40 MG PO TBEC
40.0000 mg | DELAYED_RELEASE_TABLET | Freq: Two times a day (BID) | ORAL | Status: DC
  Administered 2021-02-28 – 2021-03-05 (×10): 40 mg via ORAL
  Filled 2021-02-27 (×10): qty 1

## 2021-02-27 MED ORDER — TRAMADOL HCL 50 MG PO TABS
100.0000 mg | ORAL_TABLET | Freq: Two times a day (BID) | ORAL | Status: DC
Start: 2021-02-27 — End: 2021-02-28

## 2021-02-27 MED ORDER — MAGNESIUM OXIDE -MG SUPPLEMENT 400 (240 MG) MG PO TABS
400.0000 mg | ORAL_TABLET | Freq: Every evening | ORAL | Status: DC
  Administered 2021-02-28 – 2021-03-04 (×5): 400 mg via ORAL
  Filled 2021-02-27 (×5): qty 1

## 2021-02-27 MED ORDER — FERROUS SULFATE 325 (65 FE) MG PO TABS
325.0000 mg | ORAL_TABLET | Freq: Every day | ORAL | Status: DC
  Administered 2021-03-01 – 2021-03-05 (×5): 325 mg via ORAL
  Filled 2021-02-27 (×5): qty 1

## 2021-02-27 MED ORDER — QUETIAPINE FUMARATE 50 MG PO TABS
50.0000 mg | ORAL_TABLET | Freq: Every day | ORAL | Status: DC
  Administered 2021-02-28 – 2021-03-04 (×5): 50 mg via ORAL
  Filled 2021-02-27 (×7): qty 1

## 2021-02-27 MED ORDER — CALCIUM CARBONATE-VITAMIN D 500-200 MG-UNIT PO TABS
1.0000 | ORAL_TABLET | Freq: Every day | ORAL | Status: DC
  Administered 2021-03-01 – 2021-03-05 (×5): 1 via ORAL
  Filled 2021-02-27 (×9): qty 1

## 2021-02-27 MED ORDER — SENNOSIDES-DOCUSATE SODIUM 8.6-50 MG PO TABS: 1.0000 | ORAL_TABLET | Freq: Every evening | ORAL | Status: DC | PRN

## 2021-02-27 MED ORDER — ACETAMINOPHEN 325 MG PO TABS
650.0000 mg | ORAL_TABLET | ORAL | Status: DC | PRN
  Administered 2021-03-01 – 2021-03-04 (×4): 650 mg via ORAL
  Filled 2021-02-27 (×4): qty 2

## 2021-02-27 MED ORDER — METOPROLOL TARTRATE 25 MG PO TABS
25.0000 mg | ORAL_TABLET | Freq: Two times a day (BID) | ORAL | Status: DC
  Administered 2021-02-28 – 2021-03-05 (×8): 25 mg via ORAL
  Filled 2021-02-27 (×10): qty 1

## 2021-02-27 MED ORDER — MEMANTINE HCL 5 MG PO TABS
5.0000 mg | ORAL_TABLET | Freq: Every day | ORAL | Status: DC
  Administered 2021-03-01 – 2021-03-05 (×5): 5 mg via ORAL
  Filled 2021-02-27 (×7): qty 1

## 2021-02-27 MED ORDER — GABAPENTIN 100 MG PO CAPS
100.0000 mg | ORAL_CAPSULE | Freq: Two times a day (BID) | ORAL | Status: DC
  Administered 2021-02-28 – 2021-03-05 (×10): 100 mg via ORAL
  Filled 2021-02-27 (×10): qty 1

## 2021-02-27 MED ORDER — ADULT MULTIVITAMIN W/MINERALS CH
1.0000 | ORAL_TABLET | Freq: Every day | ORAL | Status: DC
  Administered 2021-03-01 – 2021-03-05 (×5): 1 via ORAL
  Filled 2021-02-27 (×6): qty 1

## 2021-02-27 MED ORDER — ACETAMINOPHEN 650 MG RE SUPP: 650.0000 mg | RECTAL | Status: DC | PRN

## 2021-02-27 MED ORDER — DONEPEZIL HCL 10 MG PO TABS
10.0000 mg | ORAL_TABLET | Freq: Every day | ORAL | Status: DC
  Administered 2021-02-28 – 2021-03-04 (×5): 10 mg via ORAL
  Filled 2021-02-27 (×7): qty 1

## 2021-02-27 MED ORDER — NITROGLYCERIN 0.4 MG SL SUBL: 0.4000 mg | SUBLINGUAL_TABLET | SUBLINGUAL | Status: DC | PRN

## 2021-02-27 MED ORDER — STROKE: EARLY STAGES OF RECOVERY BOOK: Freq: Once | Status: DC

## 2021-02-27 MED ORDER — ACETAMINOPHEN 160 MG/5ML PO SOLN: 650.0000 mg | ORAL | Status: DC | PRN

## 2021-02-27 MED ORDER — ROSUVASTATIN CALCIUM 20 MG PO TABS
40.0000 mg | ORAL_TABLET | Freq: Every day | ORAL | Status: DC
  Administered 2021-02-28 – 2021-03-04 (×5): 40 mg via ORAL
  Filled 2021-02-27 (×5): qty 2

## 2021-02-27 MED ORDER — VITAMIN D 25 MCG (1000 UNIT) PO TABS
1000.0000 [IU] | ORAL_TABLET | Freq: Every day | ORAL | Status: DC
  Administered 2021-03-01 – 2021-03-05 (×5): 1000 [IU] via ORAL
  Filled 2021-02-27 (×6): qty 1

## 2021-02-27 MED ORDER — POTASSIUM CHLORIDE CRYS ER 20 MEQ PO TBCR: 40.0000 meq | EXTENDED_RELEASE_TABLET | Freq: Every morning | ORAL | Status: DC

## 2021-02-27 MED ORDER — SODIUM CHLORIDE 0.9 % IV BOLUS
1000.0000 mL | Freq: Once | INTRAVENOUS | Status: AC
  Administered 2021-02-27: 1000 mL via INTRAVENOUS

## 2021-02-27 MED ORDER — ENOXAPARIN SODIUM 40 MG/0.4ML IJ SOSY
40.0000 mg | PREFILLED_SYRINGE | INTRAMUSCULAR | Status: DC
  Administered 2021-02-28 – 2021-03-04 (×5): 40 mg via SUBCUTANEOUS
  Filled 2021-02-27 (×4): qty 0.4

## 2021-02-27 MED ORDER — ASPIRIN EC 81 MG PO TBEC
81.0000 mg | DELAYED_RELEASE_TABLET | Freq: Every day | ORAL | Status: DC
  Administered 2021-03-01 – 2021-03-05 (×5): 81 mg via ORAL
  Filled 2021-02-27 (×5): qty 1

## 2021-02-27 MED ORDER — DIAZEPAM 5 MG PO TABS
5.0000 mg | ORAL_TABLET | Freq: Once | ORAL | Status: DC
Start: 2021-02-27 — End: 2021-03-05
  Filled 2021-02-27: qty 1

## 2021-02-27 MED ORDER — LACTATED RINGERS IV SOLN: INTRAVENOUS | Status: AC

## 2021-02-27 NOTE — ED Notes (Signed)
Tyrone Nine MD aware of pt mentation and lethargy and came to bedside to assess pt

## 2021-02-27 NOTE — ED Notes (Signed)
Provider at bedside

## 2021-02-27 NOTE — H&P (Signed)
History and Physical    [name] RJJ:[id number] DOB: 1940-06-14 DOA: 02/26/2021  PCP: Sara Brine, FNP Consultants:  neurology: Dr. Brett Fox, oncology: Dr. Julien Fox, cardiology: Dr. Debara Fox  Patient coming from:  Home - lives with son  Chief Complaint: gait disturbance  HPI: Sara Fox is a 80 y.o. 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   History from son. He states she had an episode about 2 weeks ago where she would tear Sara house up and "be rebellious", not clean herself, go to Sara bathroom on herself, increased confusion and home care wanted her to get checked for a UTI. He took her to Sara doctor's office and they sent her to Sara bathroom and she put water and soap in Sara cup instead of urinating. They never got a urine sample. She also has been sleeping in her chair and not Sara bed and she has fallen out and hit her head a few times, but son states she has been fine. Over Sara last few days her walking, balance, picking up things "are all out of whack." She spilt coffee out of cup, walks like she is intoxicated/off balance and is having to use a cane much more than normal. Denies any facial droop, slurred speech, or unilateral weakness.   She has history of sundowning and is usually sleepy/sleeping during Sara day.   Son denies any fever/chills, chest pain, palpitations, shortness of breath, cough, abdominal pain, N/V/D, chronic back pain unchanged, no dysuria or urinary complaints. Has healing sacral ulcer.    ED Course: vitals: Febrile, blood pressure 111/66, heart rate 66, respiratory rate 14, oxygen 100% on room air. Pertinent labs: Hemoglobin 10.6, creatinine 1.20, AST 43, ALT 54, alkaline phosphatase 155, troponin within normal limits x2, Abdominal x-ray within  normal limits.  Chest x-ray: Persistent right apical density consistent with Sara given clinical history of non-small cell carcinoma.  CT head: No acute findings.  In ED given a bolus of normal saline.  MRI brain and lumbar spine ordered with history of gait instability which has not been done.  While patient waiting became more lethargic and [facility] was asked to admit.  Review of Systems: As per HPI; otherwise review of systems reviewed and negative.   Ambulatory Status:  Ambulates without assistance, but has needed can recently.    Past Medical History:  Diagnosis Date   Acute kidney injury ([place])    Back pain    Cervical cancer (HCC)    CHF (congestive heart failure) ([place])    Closed fracture of left distal radius 05/28/2017   COPD (chronic obstructive pulmonary disease) (HCC)    Coronary artery disease    s/p DES x2 to RCA 2016   GERD (gastroesophageal reflux disease)    History of radiation therapy 07/27/20-08/16/20   Right lung- SBRT- Dr. Gery Fox    Hyperlipidemia    Hypertension    Lower leg edema 12/21/2016   Mild cognitive impairment    Myocardial infarction [facility])    Neck pain    Rhabdomyolysis    Stomach problems     Past Surgical History:  Procedure Laterality Date   ABDOMINAL HYSTERECTOMY     BACK SURGERY  2012   lower back   BRONCHIAL BIOPSY  05/25/2020   Procedure: BRONCHIAL BIOPSIES;  Surgeon:  Collene Gobble, MD;  Location: [facility];  Service: Pulmonary;;   BRONCHIAL BRUSHINGS  05/25/2020   Procedure: BRONCHIAL BRUSHINGS;  Surgeon: Collene Gobble, MD;  Location: [facility];  Service: Pulmonary;;   BRONCHIAL NEEDLE ASPIRATION BIOPSY  05/25/2020   Procedure: BRONCHIAL NEEDLE ASPIRATION BIOPSIES;  Surgeon: Collene Gobble, MD;  Location: [facility];  Service: Pulmonary;;   CARDIAC CATHETERIZATION  06/1999   noncritical disease invovling PDA   CARDIAC CATHETERIZATION N/A 09/14/2014   Procedure: Left Heart Cath and Coronary Angiography;  Surgeon: Leonie Man, MD;   Location: [facility];  Service: Cardiovascular;  Laterality: N/A;   CHOLECYSTECTOMY     ESOPHAGOGASTRODUODENOSCOPY (EGD) WITH PROPOFOL Left 07/19/2017   Procedure: ESOPHAGOGASTRODUODENOSCOPY (EGD) WITH PROPOFOL;  Surgeon: Ronnette Juniper, MD;  Location: [facility];  Service: Gastroenterology;  Laterality: Left;   FINE NEEDLE ASPIRATION  05/25/2020   Procedure: FINE NEEDLE ASPIRATION (FNA) LINEAR;  Surgeon: Collene Gobble, MD;  Location: [facility];  Service: Pulmonary;;   FRACTURE SURGERY Left 05/2017   left wrist   HARDWARE REMOVAL Left 11/07/2017   Procedure: LEFT WRIST HARDWARE REMOVAL;  Surgeon: Charlotte Crumb, MD;  Location: [facility];  Service: Orthopedics;  Laterality: Left;   KNEE SURGERY Bilateral 2001 & 2007   NECK SURGERY  2012   2012   PARTIAL GASTRECTOMY  2005   subtotal   PERCUTANEOUS CORONARY STENT INTERVENTION (PCI-S)  09/14/2014   Procedure: Percutaneous Coronary Stent Intervention (Pci-S);  Surgeon: Leonie Man, MD;  Location: [facility];  Service: Cardiovascular;;   SHOULDER SURGERY Right    TRANSTHORACIC ECHOCARDIOGRAM  06/02/2010   EF=>55%, normal LV systolic function; normal RV systolic function; mild mitral annular calcif; trace TR; AV mildly sclerotic   VIDEO BRONCHOSCOPY WITH ENDOBRONCHIAL NAVIGATION N/A 05/25/2020   Procedure: VIDEO BRONCHOSCOPY WITH ENDOBRONCHIAL NAVIGATION;  Surgeon: Collene Gobble, MD;  Location: [facility];  Service: Pulmonary;  Laterality: N/A;   VIDEO BRONCHOSCOPY WITH ENDOBRONCHIAL ULTRASOUND N/A 05/25/2020   Procedure: VIDEO BRONCHOSCOPY WITH ENDOBRONCHIAL ULTRASOUND;  Surgeon: Collene Gobble, MD;  Location: [facility];  Service: Pulmonary;  Laterality: N/A;   WRIST OSTEOTOMY Left 11/07/2017   Procedure: LEFT WRIST DISTAL ULNA RESECTION WITH EXTENSOR CARPI ULNARIS STABILIZATION;  Surgeon: Charlotte Crumb, MD;  Location: [facility];  Service: Orthopedics;  Laterality: Left;    Social History   Socioeconomic  History   Marital status: Married    Spouse name: Not on file   Number of children: 2   Years of education: GED   Highest education level: Not on file  Occupational History    Employer: RETIRED  Tobacco Use   Smoking status: Every Day    Packs/day: 0.50    Years: 60.00    Pack years: 30.00    Types: Cigarettes   Smokeless tobacco: Never   Tobacco comments:    currently smokes 1/2 ppd or less  Vaping Use   Vaping Use: Former  Substance and Sexual Activity   Alcohol use: No   Drug use: No   Sexual activity: Not Currently  Other Topics Concern   Not on file  Social History Narrative   Coffee/soda daily    Lives w/ son, Sara John   Right handed   Social Determinants of Health   Financial Resource Strain: Low Risk    Difficulty of Paying Living Expenses: Not hard at all  Food Insecurity: No Food Insecurity   Worried About Charity fundraiser in [place] Last  Year: Never true   [place] in [place] Last Year: Never true  Transportation Needs: No Transportation Needs   Lack of Transportation (Medical): No   Lack of Transportation (Non-Medical): No  Physical Activity: Inactive   Days of Exercise per Week: 0 days   Minutes of Exercise per Session: 0 min  Stress: Stress Concern Present   Feeling of Stress : Rather much  Social Connections: Not on file  Intimate Partner Violence: Not on file    Allergies  Allergen Reactions   Buprenorphine Hcl Shortness Of Breath    Throat swelling/trouble breathing and lethargic   Morphine And Related Shortness Of Breath    Throat swelling/trouble breathing and lethargic   Celebrex [Celecoxib] Diarrhea and Swelling    Swelling of legs    Vioxx [Rofecoxib] Palpitations   Codeine Other (See Comments)    Bloated     Family History  Problem Relation Age of Onset   Heart disease Mother        also HTN   Diabetes Mother    Colon cancer Mother    Heart disease Brother        deceased at 27   Hypertension Brother    Heart attack Brother     Heart disease Brother    Hypertension Brother     Prior to Admission medications   Medication Sig Start Date End Date Taking? Authorizing Provider  aspirin EC 81 MG tablet Take 81 mg by mouth daily as needed (headache).   Yes [provider]  budesonide-formoterol (SYMBICORT) 160-4.5 MCG/ACT inhaler TAKE 2 PUFFS BY MOUTH TWICE A DAY Patient taking differently: Inhale 2 puffs into Sara lungs in Sara morning and at bedtime. 12/16/20  Yes Sara Brine, FNP  Calcium Citrate-Vitamin D (CALCIUM CITRATE+D3 PETITES PO) Take 1 tablet by mouth daily.   Yes [provider]  cholecalciferol (VITAMIN D) 25 MCG (1000 UNIT) tablet Take 1,000 Units by mouth daily.   Yes [provider]  ciclopirox (PENLAC) 8 % solution Apply 1 application topically at bedtime. Apply over nail and surrounding skin. Apply daily over previous coat. After seven (7) days, file nail and continue cycle. 05/25/20  Yes Byrum, Rose Fillers, MD  [employer] MAGNESIUM OXIDE 250 MG TABS TAKE 1 TABLET BY MOUTH WITH EVENING MEAL DAILY Patient taking differently: Take 250 mg by mouth every evening. 01/10/21  Yes Sara Brine, FNP  donepezil (ARICEPT) 10 MG tablet Take 1 tablet (10 mg total) by mouth at bedtime. 12/16/20  Yes Sara Brine, FNP  ferrous sulfate 325 (65 FE) MG tablet Take 325 mg by mouth daily with breakfast.   Yes [provider]  furosemide (LASIX) 20 MG tablet Take 1 tablet by mouth Mon - Fri daily Patient taking differently: Take 20 mg by mouth See admin instructions. Monday-friday 02/02/21  Yes Sara Brine, FNP  gabapentin (NEURONTIN) 100 MG capsule Take 1 capsule (100 mg total) by mouth 2 (two) times daily. 12/16/20  Yes Sara Brine, FNP  ibuprofen (ADVIL) 200 MG tablet Take 400 mg by mouth every 6 (six) hours as needed (pain).   Yes [provider]  memantine (NAMENDA) 5 MG tablet Take 1 tablet (5 mg total) by mouth daily. 12/16/20  Yes Sara Brine, FNP  metoprolol tartrate (LOPRESSOR) 25  MG tablet Take 1 tablet (25 mg total) by mouth 2 (two) times daily. 12/17/20  Yes Hilty, Nadean Corwin, MD  Multiple Vitamin (MULTIVITAMIN WITH MINERALS) TABS tablet Take 1 tablet by mouth daily.  Yes [provider]  nitroGLYCERIN (NITROSTAT) 0.4 MG SL tablet PLACE ONE TABLET UNDER Sara TONGUE EVERY 5 MINUTES AS NEEDED FOR CHEST PAIN. Patient taking differently: Place 0.4 mg under Sara tongue every 5 (five) minutes as needed for chest pain. 12/17/20  Yes Hilty, Nadean Corwin, MD  Omega-3 Fatty Acids (FISH OIL PO) Take 1 capsule by mouth daily.   Yes [provider]  oxybutynin (DITROPAN) 5 MG tablet Take 0.5 tablets (2.5 mg total) by mouth 2 (two) times daily. 11/12/20  Yes Sara Brine, FNP  pantoprazole (PROTONIX) 40 MG tablet Take 1 tablet (40 mg total) by mouth 2 (two) times daily. 12/16/20  Yes Sara Brine, FNP  polyethylene glycol (MIRALAX / GLYCOLAX) packet Take 17 g by mouth daily as needed for mild constipation.   Yes [provider]  Potassium Chloride ER 20 MEQ TBCR Take 2 tablets by mouth every morning. Patient taking differently: Take 40 mEq by mouth every morning. 12/17/20  Yes Hilty, Nadean Corwin, MD  QUEtiapine (SEROQUEL) 50 MG tablet TAKE 1 TABLET BY MOUTH EVERYDAY AT BEDTIME Patient taking differently: Take 50 mg by mouth at bedtime. 12/16/20  Yes Sara Brine, FNP  rosuvastatin (CRESTOR) 40 MG tablet Take 1 tablet (40 mg total) by mouth daily. Patient taking differently: Take 40 mg by mouth at bedtime. 12/17/20  Yes Hilty, Nadean Corwin, MD  traMADol (ULTRAM) 50 MG tablet TAKE 1 TABLET 3 TIMES A DAY AND CAN TAKE 1 EXTRA 20 DAYS/MONTH Patient taking differently: Take 100 mg by mouth 2 (two) times daily. 01/31/21  Yes Sara Brine, FNP    Physical Exam: Vitals:   02/27/21 0515 02/27/21 0530 02/27/21 0545 02/27/21 1117  BP: (!) 150/63 130/79 106/73 135/81  Pulse: 73 75 82 84  Resp:  19  14  Temp:      TempSrc:      SpO2: 99% 99% 98% 100%  Weight:      Height:          General:  Appears calm and comfortable and is in NAD. Sleepy, but easily arouses.  Eyes:  PERRL, EOMI, normal lids, iris ENT:  grossly normal hearing, lips & tongue, mmm; no teeth Neck:  no LAD, masses or thyromegaly; no carotid bruits Cardiovascular:  RRR, no m/r/g. Bilateral LE edema to mid calf Respiratory:   CTA bilaterally with no wheezes/rales/rhonchi.  Normal respiratory effort. Abdomen:  soft, NT, ND, NABS Back:   normal alignment, no CVAT Skin:  no rash or induration seen on limited exam Musculoskeletal:  could not assess strength. Would not follow commands.  Lower extremity:  Limited foot exam with no ulcerations.  2+ distal pulses. Psychiatric:  grossly normal mood and affect, speech fluent and appropriate, alert and oriented to self and knew this was hospital. At baseline per son  Neurologic:  limited exam. CN 2-12 grossly intact, moves all extremities, but would not follow commands for any further testing. Gait deferred.     Radiological Exams on Admission: Independently reviewed - see discussion in A/P where applicable  DG Chest 2 View  Result Date: 02/26/2021 CLINICAL DATA:  Altered mental status and weakness, initial encounter EXAM: CHEST - 2 VIEW COMPARISON:  11/22/2020 FINDINGS: Cardiac shadow is enlarged but stable. Aortic calcifications are noted. Lungs are well aerated bilaterally. Persistent opacity is noted in Sara right apex consistent with Sara patient's known history of non-small cell lung carcinoma stable in appearance from Sara prior exam. No new focal infiltrate is seen. No bony abnormality is noted.  Postsurgical changes in lumbar spine are seen. IMPRESSION: Persistent right apical density consistent with Sara given clinical history of non-small cell carcinoma. No acute abnormality noted. Electronically Signed   By: Inez Catalina M.D.   On: 02/26/2021 19:51   DG Abdomen 1 View  Result Date: 02/27/2021 CLINICAL DATA:  MRI screening EXAM: ABDOMEN - 1 VIEW  COMPARISON:  None. FINDINGS: Bowel gas pattern is unremarkable. Sutures are present in Sara left upper quadrant. Surgical clips overlie Sara epigastric region, right upper quadrant, and right pelvis. Postoperative changes of Sara lumbosacral spine. IMPRESSION: No acute abnormality. No radiopaque foreign body that precludes MRI. Electronically Signed   By: Macy Mis M.D.   On: 02/27/2021 12:55   CT Head Wo Contrast  Result Date: 02/26/2021 CLINICAL DATA:  Generalized weakness, headache, altered mental status EXAM: CT HEAD WITHOUT CONTRAST TECHNIQUE: Contiguous axial images were obtained from Sara base of Sara skull through Sara vertex without intravenous contrast. COMPARISON:  11/22/2020 FINDINGS: Brain: No evidence of acute infarction, hemorrhage, hydrocephalus, extra-axial collection or mass lesion/mass effect. Scattered low-density changes within Sara periventricular and subcortical white matter compatible with chronic microvascular ischemic change. Mild diffuse cerebral volume loss. Vascular: Atherosclerotic calcifications involving Sara large vessels of Sara skull base. No unexpected hyperdense vessel. Skull: Normal. Negative for fracture or focal lesion. Sinuses/Orbits: No acute finding. Other: None. IMPRESSION: 1. No acute intracranial findings. 2. Mild chronic microvascular ischemic change and cerebral volume loss. Electronically Signed   By: Davina Poke D.O.   On: 02/26/2021 19:43    EKG: Independently reviewed.  NSR with rate 65; nonspecific ST changes with no evidence of acute ischemia   Labs on Admission: I have personally reviewed Sara available labs and imaging studies at Sara time of Sara admission.  Pertinent labs:  Hemoglobin 10.6, (9.6-11.8) creatinine 1.20 (1.23-1.42) AST 43, ALT 54,  alkaline phosphatase 155,  troponin within normal limits x2,   Assessment/Plan Principal Problem:   Gait disturbance -80 year old female presenting with ataxia, balance issues with concerns for  stroke.  Risk factors include hypertension, hyperlipidemia.  -Admit to telemetry -Neurochecks per protocol -MRI brain without contrast pending  -Takes aspirin on an as-needed basis, will start 81mg /daily  -N.p.o. until bedside swallow screen, failed bedside swallow screen, SLP eval has been ordered. Gentle IVF x 8 hours while NPO.  -Some concerns about lethargy and confusion however patient with history of sundowning and as at baseline per son.  Active Problems: Transaminitis -chronic since at least 2019 -Only mildly elevated -will trend and follow    Ambulatory dysfunction -Follow-up on MRI but I wonder if her weakness and ambulatory dysfunction is due to worsening dementia  -PT/OT eval    Chronic back pain At baseline, MRI ordered by EDP and pending Continue home tramadol/gabapentin  Stage 3 chronic kidney disease ([place]) -stable, monitor.  -still using NSAIDs despite being discharged at last hospitalization.  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  Delirium precautions continue aricept and namenda      HTN (hypertension) Decent control  continue home medication      Chronic systolic/diastolic heart failure, NYHA class 1 (HCC) -Appears euvolemic with no volume overload on exam -grade 1 diastolic dysfunction. EF 55-60%. Echo: 11/2020 -I/O and daily weights   Chronic anemia -at baseline -b12/folate checked in 11/2020, wnl  -continue daily iron    Tobacco abuse -encourage cessation   Copd -continue  home inhalers -stable.  -encourage smoking cessation      Dyslipidemia -continue statin, to goal 10/21; however, if any findings of stroke on MRI, goal LDL <70   Jerrye Bushy -continue PPI   Sacral ulcer Wound care consult  Body mass index is 21.11 kg/m.   Level of care: Telemetry Medical DVT prophylaxis:  Lovenox  Code Status: DNR  confirmed with family Family Communication: None present; I spoke with Sara patient's son by telephone at Sara time of admission.[facility] Fox: [phone] Disposition Plan:  Sara patient is from: home  Anticipated d/c is to: home   Anticipated d/c date will depend on clinical response to treatment, but possibly as early as tomorrow if she has excellent response to treatment Requires inpatient hospitalization and is at significant risk of neurological worsening, requires constant monitoring, MRI, therapy eval and assessment to make sure safe to eat and go home.  Patient is currently: stable Consults called: none Admission status:  observation   Dragon dictation used in completing this note.    Orma Flaming MD [facility]   How to contact [facility] Attending or Consulting provider [place] or covering provider during after hours [place], for this patient?  Check Sara care team in [facility] and look for a) attending/consulting [facility] provider listed and b) [place] [facility] team listed Log into [email or web address] and use [place]'s universal password to access. If you do not have Sara password, please contact Sara hospital operator. Locate [facility] provider you are looking for under [facility] and page to a number that you can be directly reached. If you still have difficulty reaching Sara provider, please page [facility] (Director on Call) for [facility] Hospitalists listed on amion for assistance.   02/27/2021, 4:44 PM

## 2021-02-27 NOTE — ED Notes (Addendum)
Patient transported to MRI 

## 2021-02-27 NOTE — ED Notes (Signed)
Per son pt here to rule out a UTI and because she has been unsteady on her feet. Walking normally up until earlier this week. Multiple falls this week. Seen at PCP for evaluation of UTI pt went to the restroom and put water and soap in the ua cup.

## 2021-02-27 NOTE — ED Provider Notes (Signed)
[facility] EMERGENCY DEPARTMENT Provider Note  CSN: [id number] Arrival date & time: 02/26/21 1826  Chief Complaint(s) Gait Problem, Fall, and Weakness  HPI Sara Fox is a 80 y.o. female   The history is provided by a relative.  Weakness Severity:  Moderate Duration:  3 days Timing:  Constant Progression:  Unchanged Chronicity:  New Context: increased activity   Context: not alcohol use, not change in medication, not dehydration and not recent infection   Relieved by:  Nothing Worsened by:  Nothing Associated symptoms: ataxia, difficulty walking and falls (had a fall 1 week ago.)   Associated symptoms: no aphasia, no diarrhea, no dizziness, no fever, no headaches, no lethargy, no loss of consciousness, no myalgias, no nausea, no seizures and no vomiting     Remainder of history, ROS, and physical exam limited due to patient's condition (Alz dementia). Additional information was obtained from Son.   Level V Caveat.  Past Medical History Past Medical History:  Diagnosis Date   Acute kidney injury ([place])    Back pain    Cervical cancer (HCC)    CHF (congestive heart failure) ([place])    Closed fracture of left distal radius 05/28/2017   COPD (chronic obstructive pulmonary disease) (HCC)    Coronary artery disease    s/p DES x2 to RCA 2016   GERD (gastroesophageal reflux disease)    History of radiation therapy 07/27/20-08/16/20   Right lung- SBRT- Dr. Gery Pray    Hyperlipidemia    Hypertension    Lower leg edema 12/21/2016   Mild cognitive impairment    Myocardial infarction [facility])    Neck pain    Rhabdomyolysis    Stomach problems    Patient Active Problem List   Diagnosis Date Noted   Fall (on)(from) incline, initial encounter 11/23/2020   Macrocytosis without anemia 11/22/2020   Unwitnessed fall 11/22/2020   Late onset Alzheimer's dementia with behavioral disturbance ([place]) 11/18/2020   Visual hallucination 11/18/2020   Sundowning 11/18/2020    Non-small cell carcinoma of right lung, stage 1 ([place]) 06/08/2020   Pulmonary nodule 1 cm or greater in diameter 05/21/2020   Atherosclerotic heart disease of native coronary artery with other forms of angina pectoris ([place]) 04/22/2020   Memory loss 02/18/2019   Vitamin B12 deficiency 10/23/2018   Stage 3 chronic kidney disease ([place]) 06/03/2018   Chronic anemia 06/03/2018   Hypercholesterolemia 06/03/2018   Malnutrition of moderate degree 07/12/2017   Hypoalbuminemia 07/11/2017   Elevated LFTs 07/11/2017   Snoring 12/21/2016   Peripheral musculoskeletal gait disorder 02/05/2015   Lumbar post-laminectomy syndrome 02/05/2015   CAD in native artery 10/16/2014   NSTEMI (non-ST elevated myocardial infarction) ([place]) 09/13/2014   Cardiomyopathy, ischemic 09/47/0962   Chronic systolic heart failure ([place])    Tobacco abuse 09/12/2014   Dyslipidemia 09/12/2014   Normocytic anemia 09/12/2014   Chronic diastolic heart failure, NYHA class 1 ([place]) 09/12/2014   Postlaminectomy syndrome, cervical region 08/01/2013   Chronic midline low back pain with left-sided sciatica 08/01/2013   Shoulder joint contracture 08/01/2013   HTN (hypertension) 03/03/2013   Abnormal genetic test 03/03/2013   Home Medication(s) Prior to Admission medications   Medication Sig Start Date End Date Taking? Authorizing Provider  aspirin EC 81 MG tablet Take 81 mg by mouth daily as needed (headache).   Yes [provider]  budesonide-formoterol (SYMBICORT) 160-4.5 MCG/ACT inhaler TAKE 2 PUFFS BY MOUTH TWICE A DAY Patient taking differently: Inhale 2 puffs into the lungs in the  morning and at bedtime. 12/16/20  Yes [name], FNP  Calcium Citrate-Vitamin D (CALCIUM CITRATE+D3 PETITES PO) Take 1 tablet by mouth daily.   Yes [provider]  cholecalciferol (VITAMIN D) 25 MCG (1000 UNIT) tablet Take 1,000 Units by mouth daily.   Yes [provider]  ciclopirox (PENLAC) 8 % solution Apply 1  application topically at bedtime. Apply over nail and surrounding skin. Apply daily over previous coat. After seven (7) days, file nail and continue cycle. 05/25/20  Yes Byrum, Rose Fillers, MD  [employer] MAGNESIUM OXIDE 250 MG TABS TAKE 1 TABLET BY MOUTH WITH EVENING MEAL DAILY Patient taking differently: Take 250 mg by mouth every evening. 01/10/21  Yes [name], FNP  donepezil (ARICEPT) 10 MG tablet Take 1 tablet (10 mg total) by mouth at bedtime. 12/16/20  Yes [name], FNP  ferrous sulfate 325 (65 FE) MG tablet Take 325 mg by mouth daily with breakfast.   Yes [provider]  furosemide (LASIX) 20 MG tablet Take 1 tablet by mouth Mon - Fri daily Patient taking differently: Take 20 mg by mouth See admin instructions. Monday-friday 02/02/21  Yes [name], FNP  gabapentin (NEURONTIN) 100 MG capsule Take 1 capsule (100 mg total) by mouth 2 (two) times daily. 12/16/20  Yes [name], FNP  ibuprofen (ADVIL) 200 MG tablet Take 400 mg by mouth every 6 (six) hours as needed (pain).   Yes [provider]  memantine (NAMENDA) 5 MG tablet Take 1 tablet (5 mg total) by mouth daily. 12/16/20  Yes [name], FNP  metoprolol tartrate (LOPRESSOR) 25 MG tablet Take 1 tablet (25 mg total) by mouth 2 (two) times daily. 12/17/20  Yes Hilty, Nadean Corwin, MD  Multiple Vitamin (MULTIVITAMIN WITH MINERALS) TABS tablet Take 1 tablet by mouth daily.   Yes [provider]  nitroGLYCERIN (NITROSTAT) 0.4 MG SL tablet PLACE ONE TABLET UNDER THE TONGUE EVERY 5 MINUTES AS NEEDED FOR CHEST PAIN. Patient taking differently: Place 0.4 mg under the tongue every 5 (five) minutes as needed for chest pain. 12/17/20  Yes Hilty, Nadean Corwin, MD  Omega-3 Fatty Acids (FISH OIL PO) Take 1 capsule by mouth daily.   Yes [provider]  oxybutynin (DITROPAN) 5 MG tablet Take 0.5 tablets (2.5 mg total) by mouth 2 (two) times daily. 11/12/20  Yes [name], FNP  pantoprazole (PROTONIX) 40 MG tablet Take 1  tablet (40 mg total) by mouth 2 (two) times daily. 12/16/20  Yes [name], FNP  polyethylene glycol (MIRALAX / GLYCOLAX) packet Take 17 g by mouth daily as needed for mild constipation.   Yes [provider]  Potassium Chloride ER 20 MEQ TBCR Take 2 tablets by mouth every morning. Patient taking differently: Take 40 mEq by mouth every morning. 12/17/20  Yes Hilty, Nadean Corwin, MD  QUEtiapine (SEROQUEL) 50 MG tablet TAKE 1 TABLET BY MOUTH EVERYDAY AT BEDTIME Patient taking differently: Take 50 mg by mouth at bedtime. 12/16/20  Yes [name], FNP  rosuvastatin (CRESTOR) 40 MG tablet Take 1 tablet (40 mg total) by mouth daily. Patient taking differently: Take 40 mg by mouth at bedtime. 12/17/20  Yes Hilty, Nadean Corwin, MD  traMADol (ULTRAM) 50 MG tablet TAKE 1 TABLET 3 TIMES A DAY AND CAN TAKE 1 EXTRA 20 DAYS/MONTH Patient taking differently: Take 100 mg by mouth 2 (two) times daily. 01/31/21  Yes [name], FNP  Past Surgical History Past Surgical History:  Procedure Laterality Date   ABDOMINAL HYSTERECTOMY     BACK SURGERY  2012   lower back   BRONCHIAL BIOPSY  05/25/2020   Procedure: BRONCHIAL BIOPSIES;  Surgeon: Collene Gobble, MD;  Location: [facility] ENDOSCOPY;  Service: Pulmonary;;   BRONCHIAL BRUSHINGS  05/25/2020   Procedure: BRONCHIAL BRUSHINGS;  Surgeon: Collene Gobble, MD;  Location: [facility] ENDOSCOPY;  Service: Pulmonary;;   BRONCHIAL NEEDLE ASPIRATION BIOPSY  05/25/2020   Procedure: BRONCHIAL NEEDLE ASPIRATION BIOPSIES;  Surgeon: Collene Gobble, MD;  Location: [place] ENDOSCOPY;  Service: Pulmonary;;   CARDIAC CATHETERIZATION  06/1999   noncritical disease invovling PDA   CARDIAC CATHETERIZATION N/A 09/14/2014   Procedure: Left Heart Cath and Coronary Angiography;  Surgeon: Leonie Man, MD;  Location: [place] INVASIVE CV LAB CUPID;  Service: Cardiovascular;   Laterality: N/A;   CHOLECYSTECTOMY     ESOPHAGOGASTRODUODENOSCOPY (EGD) WITH PROPOFOL Left 07/19/2017   Procedure: ESOPHAGOGASTRODUODENOSCOPY (EGD) WITH PROPOFOL;  Surgeon: Ronnette Juniper, MD;  Location: [facility] ENDOSCOPY;  Service: Gastroenterology;  Laterality: Left;   FINE NEEDLE ASPIRATION  05/25/2020   Procedure: FINE NEEDLE ASPIRATION (FNA) LINEAR;  Surgeon: Collene Gobble, MD;  Location: [facility] ENDOSCOPY;  Service: Pulmonary;;   FRACTURE SURGERY Left 05/2017   left wrist   HARDWARE REMOVAL Left 11/07/2017   Procedure: LEFT WRIST HARDWARE REMOVAL;  Surgeon: Charlotte Crumb, MD;  Location: [place];  Service: Orthopedics;  Laterality: Left;   KNEE SURGERY Bilateral 2001 & 2007   NECK SURGERY  2012   2012   PARTIAL GASTRECTOMY  2005   subtotal   PERCUTANEOUS CORONARY STENT INTERVENTION (PCI-S)  09/14/2014   Procedure: Percutaneous Coronary Stent Intervention (Pci-S);  Surgeon: Leonie Man, MD;  Location: [facility] INVASIVE CV LAB CUPID;  Service: Cardiovascular;;   SHOULDER SURGERY Right    TRANSTHORACIC ECHOCARDIOGRAM  06/02/2010   EF=>55%, normal LV systolic function; normal RV systolic function; mild mitral annular calcif; trace TR; AV mildly sclerotic   VIDEO BRONCHOSCOPY WITH ENDOBRONCHIAL NAVIGATION N/A 05/25/2020   Procedure: VIDEO BRONCHOSCOPY WITH ENDOBRONCHIAL NAVIGATION;  Surgeon: Collene Gobble, MD;  Location: [place] ENDOSCOPY;  Service: Pulmonary;  Laterality: N/A;   VIDEO BRONCHOSCOPY WITH ENDOBRONCHIAL ULTRASOUND N/A 05/25/2020   Procedure: VIDEO BRONCHOSCOPY WITH ENDOBRONCHIAL ULTRASOUND;  Surgeon: Collene Gobble, MD;  Location: [place] ENDOSCOPY;  Service: Pulmonary;  Laterality: N/A;   WRIST OSTEOTOMY Left 11/07/2017   Procedure: LEFT WRIST DISTAL ULNA RESECTION WITH EXTENSOR CARPI ULNARIS STABILIZATION;  Surgeon: Charlotte Crumb, MD;  Location: [place];  Service: Orthopedics;  Laterality: Left;   Family History Family History  Problem Relation Age of Onset   Heart disease Mother        also HTN    Diabetes Mother    Colon cancer Mother    Heart disease Brother        deceased at 14   Hypertension Brother    Heart attack Brother    Heart disease Brother    Hypertension Brother     Social History Social History   Tobacco Use   Smoking status: Every Day    Packs/day: 0.50    Years: 60.00    Pack years: 30.00    Types: Cigarettes   Smokeless tobacco: Never   Tobacco comments:    currently smokes 1/2 ppd or less  Vaping Use   Vaping Use: Former  Substance Use Topics   Alcohol use: No   Drug use: No   Allergies Buprenorphine hcl, Morphine  and related, Celebrex [celecoxib], Vioxx [rofecoxib], and Codeine  Review of Systems Review of Systems  Unable to perform ROS: Dementia  Constitutional:  Negative for fever.  Gastrointestinal:  Negative for diarrhea, nausea and vomiting.  Musculoskeletal:  Positive for falls (had a fall 1 week ago.). Negative for myalgias.  Neurological:  Positive for weakness. Negative for dizziness, seizures, loss of consciousness and headaches.   Physical Exam Vital Signs  I have reviewed the triage vital signs BP 130/79   Pulse 75   Temp 98 F (36.7 C) (Oral)   Resp 19   Ht 5\' 4"  (1.626 m)   Wt 55.8 kg   SpO2 99%   BMI 21.11 kg/m   Physical Exam Vitals reviewed.  Constitutional:      General: She is not in acute distress.    Appearance: She is well-developed. She is not diaphoretic.  HENT:     Head: Normocephalic and atraumatic.     Nose: Nose normal.     Mouth/Throat:     Dentition: Abnormal dentition.  Eyes:     General: No scleral icterus.       Right eye: No discharge.        Left eye: No discharge.     Conjunctiva/sclera: Conjunctivae normal.     Pupils: Pupils are equal, round, and reactive to light.  Cardiovascular:     Rate and Rhythm: Normal rate and regular rhythm.     Heart sounds: No murmur heard.   No friction rub. No gallop.  Pulmonary:     Effort: Pulmonary effort is normal. No respiratory distress.      Breath sounds: Normal breath sounds. No stridor. No rales.  Abdominal:     General: There is no distension.     Palpations: Abdomen is soft.     Tenderness: There is no abdominal tenderness.  Musculoskeletal:        General: No tenderness.     Cervical back: Normal range of motion and neck supple.  Skin:    General: Skin is warm and dry.     Findings: No erythema or rash.  Neurological:     Mental Status: She is alert. She is disoriented.     Comments: Mental Status:  Alert and oriented to person, place, Attention and concentration normal.  Speech clear.  Recent memory is impaired  Cranial Nerves:  II Visual Fields: Intact to confrontation. Visual fields intact. III, IV, VI: Pupils equal and reactive to light and near. Full eye movement without nystagmus  V Facial Sensation: Normal. No weakness of masticatory muscles  VII: No facial weakness or asymmetry  VIII Auditory Acuity: Grossly normal  IX/X: The uvula is midline; the palate elevates symmetrically  XI: Normal sternocleidomastoid and trapezius strength  XII: The tongue is midline. No atrophy or fasciculations.   Motor System: Muscle Strength: 5/5 and symmetric in the upper extremities. 4/5 to RLE. 3+/5 to LLE. No pronation or drift.  Muscle Tone: Tone and muscle bulk are normal in the upper and lower extremities.  Reflexes: DTRs: 1+ and symmetrical in all four extremities. No Clonus Coordination: Intact finger-to-nose. No tremor.  Sensation: Intact to light touch. Gait: ataxic gait with assistance. Able to take only a few steps.     ED Results and Treatments Labs (all labs ordered are listed, but only abnormal results are displayed) Labs Reviewed  COMPREHENSIVE METABOLIC PANEL - Abnormal; Notable for the following components:      Result Value   Glucose, Bld 139 (*)  BUN 37 (*)    Creatinine, Ser 1.20 (*)    Total Protein 6.4 (*)    Albumin 3.1 (*)    AST 43 (*)    ALT 54 (*)    Alkaline Phosphatase 155 (*)     GFR, Estimated 45 (*)    All other components within normal limits  CBC WITH DIFFERENTIAL/PLATELET - Abnormal; Notable for the following components:   RBC 3.57 (*)    Hemoglobin 10.6 (*)    HCT 35.9 (*)    MCV 100.6 (*)    MCHC 29.5 (*)    Lymphs Abs 0.6 (*)    All other components within normal limits  I-STAT VENOUS BLOOD GAS, ED - Abnormal; Notable for the following components:   pH, Ven 7.443 (*)    pCO2, Ven 39.9 (*)    pO2, Ven 67.0 (*)    Acid-Base Excess 3.0 (*)    HCT 31.0 (*)    Hemoglobin 10.5 (*)    All other components within normal limits  RESP PANEL BY RT-PCR (FLU A&B, COVID) ARPGX2  URINALYSIS, ROUTINE W REFLEX MICROSCOPIC  BRAIN NATRIURETIC PEPTIDE  AMMONIA  TROPONIN I (HIGH SENSITIVITY)  TROPONIN I (HIGH SENSITIVITY)                                                                                                                         EKG  EKG Interpretation  Date/Time:  Saturday February 26 2021 18:56:00 EDT Ventricular Rate:  65 PR Interval:  162 QRS Duration: 80 QT Interval:  426 QTC Calculation: 443 R Axis:   64 Text Interpretation: Normal sinus rhythm Normal ECG No acute changes Confirmed by Addison Lank [phone]) on 02/27/2021 3:27:57 AM       Radiology DG Chest 2 View  Result Date: 02/26/2021 CLINICAL DATA:  Altered mental status and weakness, initial encounter EXAM: CHEST - 2 VIEW COMPARISON:  11/22/2020 FINDINGS: Cardiac shadow is enlarged but stable. Aortic calcifications are noted. Lungs are well aerated bilaterally. Persistent opacity is noted in the right apex consistent with the patient's known history of non-small cell lung carcinoma stable in appearance from the prior exam. No new focal infiltrate is seen. No bony abnormality is noted. Postsurgical changes in lumbar spine are seen. IMPRESSION: Persistent right apical density consistent with the given clinical history of non-small cell carcinoma. No acute abnormality noted. Electronically  Signed   By: Inez Catalina M.D.   On: 02/26/2021 19:51   CT Head Wo Contrast  Result Date: 02/26/2021 CLINICAL DATA:  Generalized weakness, headache, altered mental status EXAM: CT HEAD WITHOUT CONTRAST TECHNIQUE: Contiguous axial images were obtained from the base of the skull through the vertex without intravenous contrast. COMPARISON:  11/22/2020 FINDINGS: Brain: No evidence of acute infarction, hemorrhage, hydrocephalus, extra-axial collection or mass lesion/mass effect. Scattered low-density changes within the periventricular and subcortical white matter compatible with chronic microvascular ischemic change. Mild diffuse cerebral volume loss. Vascular: Atherosclerotic calcifications involving the large vessels of the skull  base. No unexpected hyperdense vessel. Skull: Normal. Negative for fracture or focal lesion. Sinuses/Orbits: No acute finding. Other: None. IMPRESSION: 1. No acute intracranial findings. 2. Mild chronic microvascular ischemic change and cerebral volume loss. Electronically Signed   By: Davina Poke D.O.   On: 02/26/2021 19:43    Pertinent labs & imaging results that were available during my care of the patient were reviewed by me and considered in my medical decision making (see MDM for details).  Medications Ordered in ED Medications  diazepam (VALIUM) tablet 5 mg (has no administration in time range)                                                                                                                                     Procedures Procedures  (including critical care time)  Medical Decision Making / ED Course I have reviewed the nursing notes for this encounter and the patient's prior records (if available in EHR or on provided paperwork).  RYLEY TEATER was evaluated in Emergency Department on 02/27/2021 for the symptoms described in the history of present illness. She was evaluated in the context of the global COVID-19 pandemic, which necessitated  consideration that the patient might be at risk for infection with the SARS-CoV-2 virus that causes COVID-19. Institutional protocols and algorithms that pertain to the evaluation of patients at risk for COVID-19 are in a state of rapid change based on information released by regulatory bodies including the CDC and federal and state organizations. These policies and algorithms were followed during the patient's care in the ED.     Patient presents with difficulty ambulating for 3 days. No recent fevers or infections. Son reportedly wants to rule out urinary tract infection. Patient has had falls from a chair recently, last of which was a week ago. No change in medications.  Pertinent labs & imaging results that were available during my care of the patient were reviewed by me and considered in my medical decision making:  No leukocytosis. Stable hemoglobin. No significant electrolyte derangements. Stable renal insufficiency. UA without evidence of infection. VBG without evidence of hypercarbia. CT head without evidence of ICH or mass-effect. Chest x-ray without evidence of pneumonia but did reveal known right apical density consistent with her non-small cell lung cancer. Plan for MRI brain and lumbar spine to assess for stroke vs compressive lesion.   Patient care turned over to oncoming provider. Patient case and results discussed in detail; please see their note for further ED managment.     Final Clinical Impression(s) / ED Diagnoses Final diagnoses:  Ataxia     This chart was dictated using voice recognition software.  Despite best efforts to proofread,  errors can occur which can change the documentation meaning.    Fatima Blank, MD 02/27/21 [phone]

## 2021-02-28 ENCOUNTER — Ambulatory Visit: Payer: Self-pay

## 2021-02-28 DIAGNOSIS — G301 Alzheimer's disease with late onset: Secondary | ICD-10-CM

## 2021-02-28 DIAGNOSIS — N1831 Chronic kidney disease, stage 3a: Secondary | ICD-10-CM

## 2021-02-28 DIAGNOSIS — I10 Essential (primary) hypertension: Secondary | ICD-10-CM

## 2021-02-28 DIAGNOSIS — I5032 Chronic diastolic (congestive) heart failure: Secondary | ICD-10-CM

## 2021-02-28 DIAGNOSIS — R269 Unspecified abnormalities of gait and mobility: Secondary | ICD-10-CM | POA: Diagnosis not present

## 2021-02-28 LAB — CK: Total CK: 272 U/L — ABNORMAL HIGH (ref 38–234)

## 2021-02-28 LAB — CBG MONITORING, ED: Glucose-Capillary: 101 mg/dL — ABNORMAL HIGH (ref 70–99)

## 2021-02-28 LAB — VITAMIN B12: Vitamin B-12: 252 pg/mL (ref 180–914)

## 2021-02-28 MED ORDER — NYSTATIN 100000 UNIT/ML MT SUSP
5.0000 mL | Freq: Four times a day (QID) | OROMUCOSAL | Status: DC
  Administered 2021-02-28 – 2021-03-05 (×18): 500000 [IU] via ORAL
  Filled 2021-02-28 (×22): qty 5

## 2021-02-28 MED ORDER — RINGERS IV SOLN: INTRAVENOUS | Status: DC

## 2021-02-28 NOTE — Progress Notes (Signed)
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   Per family for the last 2 weeks patient has been "rebellious", not cleaning herself, confusion, unable to go to the bathroom by herself.   Assessment & Plan:   Principal Problem:   Gait disturbance Active Problems:   HTN (hypertension)   Tobacco abuse   Dyslipidemia   Chronic diastolic heart failure, NYHA class 1 (HCC)   Chronic systolic heart failure (HCC)   Stage 3 chronic kidney disease (HCC)   Chronic anemia   Non-small cell carcinoma of right lung, stage 1 (HCC)   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)   Sundowning   Macrocytosis without anemia   GERD (gastroesophageal reflux disease)   Ambulatory dysfunction   Transaminitis   1-Gait disturbances/worsening confusion: -MRI brain negative for acute stroke -MRI lumbar  spine: L4-S1 PLIF with unchanged moderate bilateral L5-S1 neural foraminal stenosis. Unchanged mild bilateral L3-4 neural foraminal stenosis. -B12; pending -Ammonia 27.  -CO2 not significantly elevated.  -UA negative for infection  2-Transaminases: Chronic since 2019: Continue to monitor  3-Ambulatory dysfunction: PT OT ordered  4-Chronic back pain: MRI of lumbar spine unchanged moderate bilateral L5-S1 foraminal stenosis: Unchanged mild bilateral L3-L4 neuroforaminal stenosis: Continue with  gabapentin Hold schedule tramadol patient is lethargic  5-stage III bCKD: Cr range 1.3 Monitor.   6-Alzheimer's dementia with behavioral disturbance Continue with Aricept and Namenda  7-chronic systolic  diastolic heart failure: Euvolemic Monitor on low rate IV fluids Continue with metoprolol.  Tobacco abuse: Encourage cessation Continue home inhalers Dyslipidemia: Continue with the statins Sacral ulcer: Present on admission: Wound care consulted    Estimated body mass index is 21.11 kg/m as calculated from the following:   Height as of this encounter: 5\' 4"  (1.626 m).   Weight as of this encounter: 55.8 kg.   DVT prophylaxis: Lovenox Code Status: DNR Family Communication: Disposition Plan:  Status is: Observation  The patient remains OBS appropriate and will d/c before 2 midnights.       Consultants:  none  Procedures:    Antimicrobials:    Subjective: Patient is sleepy, keep eyes closed, denies pain She received Valium for MRI Objective: Vitals:   02/28/21 0507 02/28/21 1021 02/28/21 1145 02/28/21 1200  BP: (!) 162/85 (!) 175/78 (!) 155/85 (!) 149/83  Pulse: 85 99  94  Resp: 16 15 18 17   Temp: 97.8 F (36.6 C)     TempSrc: Oral     SpO2: 98% 96%  100%  Weight:      Height:        Intake/Output Summary (Last 24 hours) at 02/28/2021 1322 Last data filed at 02/27/2021 2335 Gross per 24 hour  Intake 1000 ml  Output --  Net 1000 ml   Filed Weights   02/27/21 0343  Weight: 55.8 kg    Examination:  General exam: Appears calm and comfortable  Respiratory system: Clear to auscultation. Respiratory effort normal. Cardiovascular system: S1 & S2 heard, RRR. No JVD, murmurs, rubs, gallops or clicks. No pedal edema. Gastrointestinal system: Abdomen is nondistended, soft and nontender. No organomegaly or masses felt. Normal bowel sounds heard. Central nervous system: lethargic Extremities:  Symmetric 5 x 5 power.   Data Reviewed: I have personally reviewed following labs and imaging studies  CBC: Recent Labs  Lab 02/26/21 1900 02/27/21 0514  WBC 5.8  --   NEUTROABS 4.3  --   HGB 10.6* 10.5*  HCT 35.9* 31.0*  MCV 100.6*  --   PLT 317  --     Basic Metabolic Panel: Recent Labs  Lab 02/26/21 1900 02/27/21 0514  NA 141 141  K 4.8 4.7  CL 105  --   CO2 27  --   GLUCOSE 139*  --   BUN 37*  --   CREATININE 1.20*  --   CALCIUM 9.3  --    GFR: Estimated Creatinine Clearance: 31.8 mL/min (A) (by C-G formula based on SCr of 1.2 mg/dL (H)). Liver Function Tests: Recent Labs  Lab 02/26/21 1900  AST 43*  ALT 54*  ALKPHOS 155*  BILITOT 0.5  PROT 6.4*  ALBUMIN 3.1*   No results for input(s): LIPASE, AMYLASE in the last 168 hours. Recent Labs  Lab 02/27/21 1026  AMMONIA 27   Coagulation Profile: No results for input(s): INR, PROTIME in the last 168 hours. Cardiac Enzymes: No results for input(s): CKTOTAL, CKMB, CKMBINDEX, TROPONINI in the last 168 hours. BNP (last 3 results) No results for input(s): PROBNP in the last 8760 hours. HbA1C: No results for input(s): HGBA1C in the last 72 hours. CBG: Recent Labs  Lab 02/27/21 1920 02/28/21 1231  GLUCAP 83 101*   Lipid Profile: No results for input(s): CHOL, HDL, LDLCALC, TRIG, CHOLHDL, LDLDIRECT in the last 72 hours. Thyroid Function Tests: No results for input(s): TSH, T4TOTAL, FREET4, T3FREE, THYROIDAB in the last 72 hours. Anemia Panel: No results for input(s): VITAMINB12, FOLATE, FERRITIN, TIBC, IRON, RETICCTPCT in the last 72 hours. Sepsis Labs: No results for input(s): PROCALCITON, LATICACIDVEN in the last 168 hours.  Recent Results (from the past 240 hour(s))  Resp Panel by RT-PCR (Flu A&B, Covid) Nasopharyngeal Swab     Status: None   Collection Time: 02/27/21  1:22 PM   Specimen: Nasopharyngeal Swab; Nasopharyngeal(NP) swabs in vial transport medium  Result Value Ref Range Status   SARS Coronavirus 2 by RT PCR NEGATIVE NEGATIVE Final    Comment: (NOTE) SARS-CoV-2 target nucleic acids are NOT DETECTED.  The SARS-CoV-2 RNA is generally detectable in upper respiratory specimens during the acute phase of infection. The lowest concentration of  SARS-CoV-2 viral copies this assay can detect is 138 copies/mL. A negative result does not preclude SARS-Cov-2 infection and should not be used as the sole basis for treatment or other patient management decisions. A negative result may occur with  improper specimen collection/handling, submission of specimen other than nasopharyngeal swab, presence of viral mutation(s) within the areas targeted by this assay, and inadequate number of viral copies(<138 copies/mL). A negative result must be combined with clinical observations, patient history, and epidemiological information. The expected result is Negative.  Fact Sheet for Patients:  BloggerCourse.com  Fact Sheet for Healthcare Providers:  SeriousBroker.it  This test is no t yet approved or cleared by the [place] FDA and  has been authorized for detection and/or diagnosis of SARS-CoV-2 by FDA under an Emergency Use Authorization (EUA). This EUA will remain  in effect (meaning this test can be used) for the duration of the COVID-19 declaration under Section 564(b)(1) of the Act, 21 U.S.C.section 360bbb-3(b)(1), unless the authorization is terminated  or revoked sooner.       Influenza A by PCR NEGATIVE  NEGATIVE Final   Influenza B by PCR NEGATIVE NEGATIVE Final    Comment: (NOTE) The Xpert Xpress SARS-CoV-2/FLU/RSV plus assay is intended as an aid in the diagnosis of influenza from Nasopharyngeal swab specimens and should not be used as a sole basis for treatment. Nasal washings and aspirates are unacceptable for Xpert Xpress SARS-CoV-2/FLU/RSV testing.  Fact Sheet for Patients: BloggerCourse.com  Fact Sheet for Healthcare Providers: SeriousBroker.it  This test is not yet approved or cleared by the [place] FDA and has been authorized for detection and/or diagnosis of SARS-CoV-2 by FDA under an Emergency Use  Authorization (EUA). This EUA will remain in effect (meaning this test can be used) for the duration of the COVID-19 declaration under Section 564(b)(1) of the Act, 21 U.S.C. section 360bbb-3(b)(1), unless the authorization is terminated or revoked.  Performed at [facility], [address], [place], [place] [address]          Radiology Studies: DG Chest 2 View  Result Date: 02/26/2021 CLINICAL DATA:  Altered mental status and weakness, initial encounter EXAM: CHEST - 2 VIEW COMPARISON:  11/22/2020 FINDINGS: Cardiac shadow is enlarged but stable. Aortic calcifications are noted. Lungs are well aerated bilaterally. Persistent opacity is noted in the right apex consistent with the patient's known history of non-small cell lung carcinoma stable in appearance from the prior exam. No new focal infiltrate is seen. No bony abnormality is noted. Postsurgical changes in lumbar spine are seen. IMPRESSION: Persistent right apical density consistent with the given clinical history of non-small cell carcinoma. No acute abnormality noted. Electronically Signed   By: Alcide Clever M.D.   On: 02/26/2021 19:51   DG Abdomen 1 View  Result Date: 02/27/2021 CLINICAL DATA:  MRI screening EXAM: ABDOMEN - 1 VIEW COMPARISON:  None. FINDINGS: Bowel gas pattern is unremarkable. Sutures are present in the left upper quadrant. Surgical clips overlie the epigastric region, right upper quadrant, and right pelvis. Postoperative changes of the lumbosacral spine. IMPRESSION: No acute abnormality. No radiopaque foreign body that precludes MRI. Electronically Signed   By: Guadlupe Spanish M.D.   On: 02/27/2021 12:55   CT Head Wo Contrast  Result Date: 02/26/2021 CLINICAL DATA:  Generalized weakness, headache, altered mental status EXAM: CT HEAD WITHOUT CONTRAST TECHNIQUE: Contiguous axial images were obtained from the base of the skull through the vertex without intravenous contrast. COMPARISON:  11/22/2020 FINDINGS:  Brain: No evidence of acute infarction, hemorrhage, hydrocephalus, extra-axial collection or mass lesion/mass effect. Scattered low-density changes within the periventricular and subcortical white matter compatible with chronic microvascular ischemic change. Mild diffuse cerebral volume loss. Vascular: Atherosclerotic calcifications involving the large vessels of the skull base. No unexpected hyperdense vessel. Skull: Normal. Negative for fracture or focal lesion. Sinuses/Orbits: No acute finding. Other: None. IMPRESSION: 1. No acute intracranial findings. 2. Mild chronic microvascular ischemic change and cerebral volume loss. Electronically Signed   By: Duanne Guess D.O.   On: 02/26/2021 19:43   MR BRAIN WO CONTRAST  Result Date: 02/27/2021 CLINICAL DATA:  Acute neurologic deficit EXAM: MRI HEAD WITHOUT CONTRAST TECHNIQUE: Multiplanar, multiecho pulse sequences of the brain and surrounding structures were obtained without intravenous contrast. COMPARISON:  06/21/2020 FINDINGS: Brain: No acute infarct, mass effect or extra-axial collection. No acute or chronic hemorrhage. There is multifocal hyperintense T2-weighted signal within the white matter. Generalized volume loss without a clear lobar predilection. The midline structures are normal. Vascular: Major flow voids are preserved. Skull and upper cervical spine: Normal calvarium and skull base. Visualized  upper cervical spine and soft tissues are normal. Sinuses/Orbits:No paranasal sinus fluid levels or advanced mucosal thickening. No mastoid or middle ear effusion. Normal orbits. IMPRESSION: 1. No acute intracranial abnormality. 2. Findings of chronic small vessel ischemia and volume loss. Electronically Signed   By: Deatra Robinson M.D.   On: 02/27/2021 19:21   MR LUMBAR SPINE WO CONTRAST  Result Date: 02/27/2021 CLINICAL DATA:  Ataxia EXAM: MRI LUMBAR SPINE WITHOUT CONTRAST TECHNIQUE: Multiplanar, multisequence MR imaging of the lumbar spine was  performed. No intravenous contrast was administered. COMPARISON:  03/26/2010 FINDINGS: Segmentation:  Standard. Alignment:  Grade 1 anterolisthesis at L4-5 and L5-S1 Vertebrae:  L4-S1 PLIF.  No acute abnormality. Conus medullaris and cauda equina: Conus extends to the L2 level. Conus and cauda equina appear normal. Paraspinal and other soft tissues: Negative Disc levels: L1-L2: Normal disc space and facet joints. No spinal canal stenosis. No neural foraminal stenosis. L2-L3: Normal disc space and facet joints. No spinal canal stenosis. No neural foraminal stenosis. L3-L4: Small disc bulge. No spinal canal stenosis. Mild bilateral neural foraminal stenosis. L4-L5: Grade 1 anterolisthesis. PLIF. No spinal canal stenosis. No neural foraminal stenosis. L5-S1: Grade 1 anterolisthesis. PLIF. No spinal canal stenosis. Unchanged moderate bilateral neural foraminal stenosis. Visualized sacrum: Normal. IMPRESSION: 1. L4-S1 PLIF with unchanged moderate bilateral L5-S1 neural foraminal stenosis. 2. Unchanged mild bilateral L3-4 neural foraminal stenosis. Electronically Signed   By: Deatra Robinson M.D.   On: 02/27/2021 19:27        Scheduled Meds:   stroke: mapping our early stages of recovery book   Does not apply Once   aspirin EC  81 mg Oral Daily   calcium-vitamin D  1 tablet Oral Daily   cholecalciferol  1,000 Units Oral Daily   diazepam  5 mg Oral Once   donepezil  10 mg Oral QHS   enoxaparin (LOVENOX) injection  40 mg Subcutaneous Q24H   ferrous sulfate  325 mg Oral Q breakfast   gabapentin  100 mg Oral BID   magnesium oxide  400 mg Oral QPM   memantine  5 mg Oral Daily   metoprolol tartrate  25 mg Oral BID   mometasone-formoterol  2 puff Inhalation BID   multivitamin with minerals  1 tablet Oral Daily   nystatin  5 mL Oral QID   oxybutynin  2.5 mg Oral BID   pantoprazole  40 mg Oral BID   QUEtiapine  50 mg Oral QHS   rosuvastatin  40 mg Oral QHS   Continuous Infusions:  ringers 50 mL/hr at  02/28/21 1220     LOS: 0 days    Time spent: 35 minutes    Riyad Keena A Phuong Hillary, MD [facility]   If 7PM-7AM, please contact night-coverage [email or web address]  02/28/2021, 1:22 PM

## 2021-02-28 NOTE — ED Notes (Signed)
Pt's son at bedside & was updated regarding pt.

## 2021-02-28 NOTE — Evaluation (Signed)
Clinical/Bedside Swallow Evaluation Patient Details  Name: Sara Fox MRN: [id number] Date of Birth: [birth year]  Today's Date: 02/28/2021 Time: SLP Start Time (ACUTE ONLY): 33 SLP Stop Time (ACUTE ONLY): 1049 SLP Time Calculation (min) (ACUTE ONLY): 21 min  Past Medical History:  Past Medical History:  Diagnosis Date   Acute kidney injury ([place])    Back pain    Cervical cancer (HCC)    CHF (congestive heart failure) (HCC)    Closed fracture of left distal radius 05/28/2017   COPD (chronic obstructive pulmonary disease) (HCC)    Coronary artery disease    s/p DES x2 to RCA 2016   GERD (gastroesophageal reflux disease)    History of radiation therapy 07/27/20-08/16/20   Right lung- SBRT- Dr. Gery Pray    Hyperlipidemia    Hypertension    Lower leg edema 12/21/2016   Mild cognitive impairment    Myocardial infarction [facility])    Neck pain    Rhabdomyolysis    Stomach problems    Past Surgical History:  Past Surgical History:  Procedure Laterality Date   ABDOMINAL HYSTERECTOMY     BACK SURGERY  2012   lower back   BRONCHIAL BIOPSY  05/25/2020   Procedure: BRONCHIAL BIOPSIES;  Surgeon: Collene Gobble, MD;  Location: [facility] ENDOSCOPY;  Service: Pulmonary;;   BRONCHIAL BRUSHINGS  05/25/2020   Procedure: BRONCHIAL BRUSHINGS;  Surgeon: Collene Gobble, MD;  Location: [facility] ENDOSCOPY;  Service: Pulmonary;;   BRONCHIAL NEEDLE ASPIRATION BIOPSY  05/25/2020   Procedure: BRONCHIAL NEEDLE ASPIRATION BIOPSIES;  Surgeon: Collene Gobble, MD;  Location: [facility] ENDOSCOPY;  Service: Pulmonary;;   CARDIAC CATHETERIZATION  06/1999   noncritical disease invovling PDA   CARDIAC CATHETERIZATION N/A 09/14/2014   Procedure: Left Heart Cath and Coronary Angiography;  Surgeon: Leonie Man, MD;  Location: [facility] INVASIVE CV LAB CUPID;  Service: Cardiovascular;  Laterality: N/A;   CHOLECYSTECTOMY     ESOPHAGOGASTRODUODENOSCOPY (EGD) WITH PROPOFOL Left 07/19/2017   Procedure: ESOPHAGOGASTRODUODENOSCOPY (EGD) WITH  PROPOFOL;  Surgeon: Ronnette Juniper, MD;  Location: [facility] ENDOSCOPY;  Service: Gastroenterology;  Laterality: Left;   FINE NEEDLE ASPIRATION  05/25/2020   Procedure: FINE NEEDLE ASPIRATION (FNA) LINEAR;  Surgeon: Collene Gobble, MD;  Location: [facility] ENDOSCOPY;  Service: Pulmonary;;   FRACTURE SURGERY Left 05/2017   left wrist   HARDWARE REMOVAL Left 11/07/2017   Procedure: LEFT WRIST HARDWARE REMOVAL;  Surgeon: Charlotte Crumb, MD;  Location: [facility];  Service: Orthopedics;  Laterality: Left;   KNEE SURGERY Bilateral 2001 & 2007   NECK SURGERY  2012   2012   PARTIAL GASTRECTOMY  2005   subtotal   PERCUTANEOUS CORONARY STENT INTERVENTION (PCI-S)  09/14/2014   Procedure: Percutaneous Coronary Stent Intervention (Pci-S);  Surgeon: Leonie Man, MD;  Location: [facility] INVASIVE CV LAB CUPID;  Service: Cardiovascular;;   SHOULDER SURGERY Right    TRANSTHORACIC ECHOCARDIOGRAM  06/02/2010   EF=>55%, normal LV systolic function; normal RV systolic function; mild mitral annular calcif; trace TR; AV mildly sclerotic   VIDEO BRONCHOSCOPY WITH ENDOBRONCHIAL NAVIGATION N/A 05/25/2020   Procedure: VIDEO BRONCHOSCOPY WITH ENDOBRONCHIAL NAVIGATION;  Surgeon: Collene Gobble, MD;  Location: [facility] ENDOSCOPY;  Service: Pulmonary;  Laterality: N/A;   VIDEO BRONCHOSCOPY WITH ENDOBRONCHIAL ULTRASOUND N/A 05/25/2020   Procedure: VIDEO BRONCHOSCOPY WITH ENDOBRONCHIAL ULTRASOUND;  Surgeon: Collene Gobble, MD;  Location: [facility] ENDOSCOPY;  Service: Pulmonary;  Laterality: N/A;   WRIST OSTEOTOMY Left 11/07/2017   Procedure: LEFT WRIST DISTAL ULNA RESECTION WITH EXTENSOR CARPI  ULNARIS STABILIZATION;  Surgeon: Charlotte Crumb, MD;  Location: [facility];  Service: Orthopedics;  Laterality: Left;   HPI:  Sara Fox is a 80 y.o. female with medical history significant of    alzheimers, HTN, HLD, CKD stage 3, systolic and diastolic CHF, CAD, OSA and COPD, b12 deficiency, non small cell CA of right lung stage 1, tobacco abuse presenting to ED with  gait instability/balance issues. Ct head/MRI negative. Chest x-ray: Persistent right apical density consistent with the given clinical history of non-small cell carcinoma. Failed swallow screen.    Assessment / Plan / Recommendation  Clinical Impression  Pt demonstrates no immediate signs of aspiration or dysphagia. Pt fully alert, particiapting in self feeding. Eager for water and ice cream. SLP stepped away for a moment and RN at bedside reported wet coughing. SLP returned and provided another 8 oz of water and ice cream. Pt again tolerated well. Suspect pt may have beeen clearing secretions or could even have some reflux symptoms. Will initiate a mechanical soft diet given lack of dentition and sign off. Please reorder if concerns arise. SLP Visit Diagnosis: Dysphagia, unspecified (R13.10)    Aspiration Risk  Mild aspiration risk    Diet Recommendation Dysphagia 3 (Mech soft);Thin liquid   Liquid Administration via: Cup;Straw Medication Administration: Whole meds with liquid Supervision: Patient able to self feed Compensations: Slow rate;Small sips/bites Postural Changes: Seated upright at 90 degrees    Other  Recommendations Oral Care Recommendations: Oral care BID    Recommendations for follow up therapy are one component of a multi-disciplinary discharge planning process, led by the attending physician.  Recommendations may be updated based on patient status, additional functional criteria and insurance authorization.  Follow up Recommendations None      Frequency and Duration            Prognosis        Swallow Study   General HPI: Sara Fox is a 80 y.o. female with medical history significant of    alzheimers, HTN, HLD, CKD stage 3, systolic and diastolic CHF, CAD, OSA and COPD, b12 deficiency, non small cell CA of right lung stage 1, tobacco abuse presenting to ED with gait instability/balance issues. Ct head/MRI negative. Chest x-ray: Persistent right apical density  consistent with the given clinical history of non-small cell carcinoma. Failed swallow screen. Type of Study: Bedside Swallow Evaluation Diet Prior to this Study: NPO Temperature Spikes Noted: No Respiratory Status: Room air History of Recent Intubation: No Behavior/Cognition: Alert;Cooperative;Pleasant mood Oral Cavity Assessment: Within Functional Limits Oral Care Completed by SLP: No Oral Cavity - Dentition: Edentulous Vision: Functional for self-feeding Self-Feeding Abilities: Able to feed self Patient Positioning: Upright in bed Baseline Vocal Quality: Normal Volitional Cough: Congested    Oral/Motor/Sensory Function Overall Oral Motor/Sensory Function: Within functional limits   Ice Chips     Thin Liquid Thin Liquid: Within functional limits    Nectar Thick Nectar Thick Liquid: Not tested   Honey Thick Honey Thick Liquid: Not tested   Puree Puree: Within functional limits   Solid     Solid: Within functional limits     Herbie Baltimore, MA [place] Pager [phone] Office [phone]  Lynann Beaver 02/28/2021,11:25 AM

## 2021-02-28 NOTE — ED Notes (Signed)
MD at bedside & updated the son.

## 2021-02-28 NOTE — Evaluation (Signed)
Physical Therapy Evaluation Patient Details Name: Sara Fox MRN: [id number] DOB: 1941-04-10 Today's Date: 02/28/2021  History of Present Illness  Pt is an 80 y/o famale admitted secondary to AMS and gait abnormality. Imaging negative. PMH includes CAD, HTN, tobacco use, dCHF, dementia and CKD.  Clinical Impression  Pt admitted secondary to problem above with deficits below. Pt very unsteady and requiring min to light mod A +2 for transfers and gait this session. Pt with dementia at baseline so unsure of accuracy of information provided throughout. Per MD notes, pt has had a decline in function, however, and feel pt would benefit from SNF level therapies at d/c. Will continue to follow acutely.        Recommendations for follow up therapy are one component of a multi-disciplinary discharge planning process, led by the attending physician.  Recommendations may be updated based on patient status, additional functional criteria and insurance authorization.  Follow Up Recommendations SNF;Supervision/Assistance - 24 hour    Equipment Recommendations  Wheelchair cushion (measurements PT);Wheelchair (measurements PT)    Recommendations for Other Services       Precautions / Restrictions Precautions Precautions: Fall Restrictions Weight Bearing Restrictions: No      Mobility  Bed Mobility Overal bed mobility: Needs Assistance Bed Mobility: Supine to Sit;Sit to Supine;Rolling Rolling: Min assist   Supine to sit: Mod assist;+2 for safety/equipment;+2 for physical assistance Sit to supine: Mod assist;+2 for safety/equipment;+2 for physical assistance   General bed mobility comments: Required assist for LE and trunk assist. Also required assist to scoot hips to EOB. Min A to roll from side to side following mobility for clean up.    Transfers Overall transfer level: Needs assistance Equipment used: 2 person hand held assist Transfers: Sit to/from Stand Sit to Stand: Min assist;+2  physical assistance         General transfer comment: Min A +2 for steadying to stand. Required blocking of knees for safety to stand.  Ambulation/Gait Ambulation/Gait assistance: Min assist;Mod assist;+2 physical assistance Gait Distance (Feet): 2 Feet Assistive device: 2 person hand held assist Gait Pattern/deviations: Step-through pattern;Decreased stride length;Narrow base of support;Ataxic Gait velocity: Decreased   General Gait Details: decreased coordination noted when taking steps. Pt also with very narrow BOS. Required min to light mod A +2 for steadying.  Stairs            Wheelchair Mobility    Modified Rankin (Stroke Patients Only)       Balance Overall balance assessment: Needs assistance Sitting-balance support: Feet supported;No upper extremity supported;Bilateral upper extremity supported Sitting balance-Leahy Scale: Fair Sitting balance - Comments: Close supervision for sitting balance   Standing balance support: Bilateral upper extremity supported Standing balance-Leahy Scale: Poor Standing balance comment: Reliant on UE and external support                             Pertinent Vitals/Pain Pain Assessment: Faces Faces Pain Scale: Hurts little more Pain Location: RLE Pain Descriptors / Indicators: Grimacing;Guarding Pain Intervention(s): Limited activity within patient's tolerance;Monitored during session;Repositioned    Home Living Family/patient expects to be discharged to:: Private residence Living Arrangements: Children;Spouse/significant other Available Help at Discharge: Family;Available 24 hours/day Type of Home: House Home Access: Stairs to enter;Level entry (level entry in back)   Entrance Stairs-Number of Steps: 3 Home Layout: One level Home Equipment: Walker - 2 wheels Additional Comments: Unsure of accuracy given cognitive deficits and no family present.  Prior Function Level of Independence: Needs assistance    Gait / Transfers Assistance Needed: Used walker when she goes out; unsure of accuracy  ADL's / Homemaking Assistance Needed: Reports she does not need help, but unsure of accuracy        Hand Dominance        Extremity/Trunk Assessment   Upper Extremity Assessment Upper Extremity Assessment: Defer to OT evaluation    Lower Extremity Assessment Lower Extremity Assessment: Generalized weakness;RLE deficits/detail RLE Deficits / Details: Increased pain with movement of RLE.    Cervical / Trunk Assessment Cervical / Trunk Assessment: Kyphotic  Communication   Communication: HOH  Cognition Arousal/Alertness: Awake/alert Behavior During Therapy: Flat affect Overall Cognitive Status: No family/caregiver present to determine baseline cognitive functioning                                 General Comments: Dementia at baseline. Internally distracted throughout. No family present      General Comments General comments (skin integrity, edema, etc.): No family present    Exercises     Assessment/Plan    PT Assessment Patient needs continued PT services  PT Problem List Decreased strength;Decreased balance;Decreased activity tolerance;Decreased mobility;Decreased knowledge of use of DME;Decreased cognition;Decreased safety awareness;Decreased knowledge of precautions       PT Treatment Interventions DME instruction;Gait training;Functional mobility training;Therapeutic exercise;Therapeutic activities;Balance training;Patient/family education;Cognitive remediation;Neuromuscular re-education;Stair training    PT Goals (Current goals can be found in the Care Plan section)  Acute Rehab PT Goals Patient Stated Goal: "to get ice cream" PT Goal Formulation: With patient Time For Goal Achievement: 03/14/21 Potential to Achieve Goals: Good    Frequency Min 2X/week   Barriers to discharge        Co-evaluation PT/OT/SLP Co-Evaluation/Treatment: Yes Reason for  Co-Treatment: For patient/therapist safety;To address functional/ADL transfers PT goals addressed during session: Balance;Mobility/safety with mobility         AM-PAC PT "6 Clicks" Mobility  Outcome Measure Help needed turning from your back to your side while in a flat bed without using bedrails?: A Little Help needed moving from lying on your back to sitting on the side of a flat bed without using bedrails?: Total Help needed moving to and from a bed to a chair (including a wheelchair)?: Total Help needed standing up from a chair using your arms (e.g., wheelchair or bedside chair)?: Total Help needed to walk in hospital room?: Total Help needed climbing 3-5 steps with a railing? : Total 6 Click Score: 8    End of Session Equipment Utilized During Treatment: Gait belt Activity Tolerance: Patient tolerated treatment well Patient left: in bed;with call bell/phone within reach (on stretcher in ED) Nurse Communication: Mobility status PT Visit Diagnosis: Unsteadiness on feet (R26.81);Muscle weakness (generalized) (M62.81);Other abnormalities of gait and mobility (R26.89)    Time: 4496-7591 PT Time Calculation (min) (ACUTE ONLY): 17 min   Charges:   PT Evaluation $PT Eval Moderate Complexity: 1 Mod          Reuel Derby, PT, DPT  Acute Rehabilitation Services  Pager: [phone] Office: [phone]   Rudean Hitt 02/28/2021, 12:42 PM

## 2021-02-28 NOTE — ED Notes (Addendum)
Feed patient some apple sauce and drink sips of water.

## 2021-02-28 NOTE — Consult Note (Signed)
[facility] Nurse Consult Note: Reason for Consult:Somewhat limited assessment while in dialysis.  Moisture associated skin damage to bilateral upper buttocks.  Present on admission.  Incontinent with dementia and refuses care/gets agitated at home with son.  Seeking SNF placement.  Wound type:Moisture (incontinence)  Pressure Injury POA: Yes Measurement: Left gluteal:  2 cm x 2.4 cm x0.1 cm Right gluteal: 1.5 cm x 2 cm x 0;1 cm  Wound TYY:PEJY and moist  Drainage (amount, consistency, odor) minimal weeping.  No odor.  Periwound: intact  dry skin Dressing procedure/placement/frequency: Cleanse buttocks with soap and water and pat dry. Apply Gerhardts butt paste twice daily and PRN soilage.  No disposable briefs or underpads.  Will not follow at this time.  Please re-consult if needed.  Domenic Moras MSN, RN, FNP-BC CWON Wound, Ostomy, Continence Nurse Pager [phone]

## 2021-02-28 NOTE — Consult Note (Signed)
[facility] Inpatient Consult   02/28/2021  LAYIA GAMA 22-Sep-1940 [id number]  [facility] [[facility]]  Accountable Care Organization [ACO] Patient:  [employer] Medicare  Primary Care Provider:  Arnette Felts, FNP, [facility], [facility] Embedded provider Chronic Care Management  Alerted by CCM social worker of ED observation that patient is also being followed by APS.  Alerted inpatient TOC, Durward Mallard, RN of patient in ED and being followed by APS, she states she will alert inpatient TOC, LCSW, Shannon.  For questions,  Charlesetta Shanks, RN BSN CCM [facility]  [phone] business mobile phone Toll free office [phone]  Fax number: [phone] [email or web address]

## 2021-02-28 NOTE — Evaluation (Signed)
Occupational Therapy Evaluation Patient Details Name: Sara Fox MRN: [id number] DOB: 1941-04-06 Today's Date: 02/28/2021   History of Present Illness 80 y/o female presenting to ED on 10/16 with AMS and gait abnormality. Imaging negative. PMH includes CAD, HTN, tobacco use, dCHF, dementia and CKD.   Clinical Impression   PTA, pt was living with her son and reports she performed ADLs; unsure of reliability due to cognition. Pt currently requiring Mod A for UB ADLs, Max A for LB ADLs, and Min-Mod A for functional mobility. Pt presenting with decreased strength, balance, and cognition. Pt pleasant and agreeable to therapy. Pt would benefit from further acute OT to facilitate safe dc. Recommend dc to SNF for further OT to optimize safety, independence with ADLs, and return to PLOF.      Recommendations for follow up therapy are one component of a multi-disciplinary discharge planning process, led by the attending physician.  Recommendations may be updated based on patient status, additional functional criteria and insurance authorization.   Follow Up Recommendations  SNF    Equipment Recommendations  Other (comment) (Defer to next venue)    Recommendations for Other Services       Precautions / Restrictions Precautions Precautions: Fall Restrictions Weight Bearing Restrictions: No      Mobility Bed Mobility Overal bed mobility: Needs Assistance Bed Mobility: Supine to Sit;Sit to Supine;Rolling Rolling: Min assist   Supine to sit: Mod assist;+2 for safety/equipment;+2 for physical assistance Sit to supine: Mod assist;+2 for safety/equipment;+2 for physical assistance   General bed mobility comments: Required assist for LE and trunk assist. Also required assist to scoot hips to EOB. Min A to roll from side to side following mobility for clean up.    Transfers Overall transfer level: Needs assistance Equipment used: 2 person hand held assist Transfers: Sit to/from  Stand Sit to Stand: Min assist;+2 physical assistance         General transfer comment: Min A +2 for steadying to stand. Required blocking of knees for safety to stand.    Balance Overall balance assessment: Needs assistance Sitting-balance support: Feet supported;No upper extremity supported;Bilateral upper extremity supported Sitting balance-Leahy Scale: Fair Sitting balance - Comments: Close supervision for sitting balance   Standing balance support: Bilateral upper extremity supported Standing balance-Leahy Scale: Poor Standing balance comment: Reliant on UE and external support                           ADL either performed or assessed with clinical judgement   ADL Overall ADL's : Needs assistance/impaired Eating/Feeding: Set up;Supervision/ safety;Bed level   Grooming: Set up;Supervision/safety;Sitting   Upper Body Bathing: Moderate assistance;Sitting   Lower Body Bathing: Maximal assistance;Sit to/from stand   Upper Body Dressing : Moderate assistance;Sitting   Lower Body Dressing: Maximal assistance;Sit to/from stand   Toilet Transfer: +2 for physical assistance;+2 for safety/equipment;Minimal assistance;Moderate assistance (side steps towards [facility]')           Functional mobility during ADLs: Minimal assistance;Moderate assistance;+2 for safety/equipment;+2 for physical assistance (side steps towards HOB) General ADL Comments: Pt presenting with decreased balance, strength, and cognition     Vision Baseline Vision/History: 1 Wears glasses (reading)       Perception     Praxis      Pertinent Vitals/Pain Pain Assessment: Faces Faces Pain Scale: Hurts little more Pain Location: RLE Pain Descriptors / Indicators: Grimacing;Guarding Pain Intervention(s): Monitored during session;Limited activity within patient's tolerance;Repositioned     Hand  Dominance     Extremity/Trunk Assessment Upper Extremity Assessment Upper Extremity Assessment:  Generalized weakness   Lower Extremity Assessment Lower Extremity Assessment: Defer to PT evaluation RLE Deficits / Details: Increased pain with movement of RLE.   Cervical / Trunk Assessment Cervical / Trunk Assessment: Kyphotic   Communication Communication Communication: HOH   Cognition Arousal/Alertness: Awake/alert Behavior During Therapy: Flat affect Overall Cognitive Status: No family/caregiver present to determine baseline cognitive functioning                                 General Comments: Dementia at baseline. Internally distracted throughout. No family present   General Comments  VSS on RA    Exercises     Shoulder Instructions      Home Living Family/patient expects to be discharged to:: Private residence Living Arrangements: Children;Spouse/significant other Available Help at Discharge: Family;Available 24 hours/day Type of Home: House Home Access: Stairs to enter;Level entry (level entry in back) Entrance Stairs-Number of Steps: 3   Home Layout: One level     Bathroom Shower/Tub: Chief Strategy Officer: Standard     Home Equipment: Environmental consultant - 2 wheels   Additional Comments: Unsure of accuracy given cognitive deficits and no family present.      Prior Functioning/Environment Level of Independence: Needs assistance  Gait / Transfers Assistance Needed: Used walker when she goes out; unsure of accuracy ADL's / Homemaking Assistance Needed: Reports she does not need help, but unsure of accuracy            OT Problem List: Decreased strength;Decreased range of motion;Decreased activity tolerance;Impaired balance (sitting and/or standing);Decreased knowledge of use of DME or AE;Decreased knowledge of precautions;Pain      OT Treatment/Interventions: Self-care/ADL training;Therapeutic exercise;Energy conservation;DME and/or AE instruction;Therapeutic activities;Patient/family education    OT Goals(Current goals can be found  in the care plan section) Acute Rehab OT Goals Patient Stated Goal: "to get ice cream" OT Goal Formulation: With patient Time For Goal Achievement: 03/14/21 Potential to Achieve Goals: Good  OT Frequency: Min 2X/week   Barriers to D/C:            Co-evaluation PT/OT/SLP Co-Evaluation/Treatment: Yes Reason for Co-Treatment: To address functional/ADL transfers;For patient/therapist safety PT goals addressed during session: Balance;Mobility/safety with mobility OT goals addressed during session: ADL's and self-care      AM-PAC OT "6 Clicks" Daily Activity     Outcome Measure Help from another person eating meals?: A Little Help from another person taking care of personal grooming?: A Little Help from another person toileting, which includes using toliet, bedpan, or urinal?: A Lot Help from another person bathing (including washing, rinsing, drying)?: A Lot Help from another person to put on and taking off regular upper body clothing?: A Lot Help from another person to put on and taking off regular lower body clothing?: A Lot 6 Click Score: 14   End of Session Nurse Communication: Mobility status  Activity Tolerance: Patient limited by fatigue Patient left: in bed;with call bell/phone within reach  OT Visit Diagnosis: Unsteadiness on feet (R26.81);Other abnormalities of gait and mobility (R26.89);Muscle weakness (generalized) (M62.81)                Time: 4098-1191 OT Time Calculation (min): 17 min Charges:  OT General Charges $OT Visit: 1 Visit OT Evaluation $OT Eval Moderate Complexity: 1 Mod  Alyssia Heese MSOT, OTR/L Acute Rehab Pager: [phone] Office: [phone]  Luan Moore  M Ebba Goll 02/28/2021, 1:29 PM

## 2021-02-28 NOTE — Chronic Care Management (AMB) (Signed)
Chronic Care Management    Social Work Note  02/28/2021 Name: Sara Fox MRN: [id number] DOB: 1940-11-27  Sara Fox is a 80 y.o. year old female who is a primary care patient of [name], [place]. The CCM team was consulted to assist the patient with chronic disease management and/or care coordination needs related to:  HTN, CHF, CKD III, Alzheimer's Disease .   Collaboration with Hospital Liaison  for  continuity of care  in response to provider referral for social work chronic care management and care coordination services.   Consent to Services:  The patient was given information about Chronic Care Management services, agreed to services, and gave verbal consent prior to initiation of services.  Please see initial visit note for detailed documentation.   Patient agreed to services and consent obtained.   Assessment: Review of patient past medical history, allergies, medications, and health status, including review of relevant consultants reports was performed today as part of a comprehensive evaluation and provision of chronic care management and care coordination services.     SDOH (Social Determinants of Health) assessments and interventions performed:    Advanced Directives Status: Not addressed in this encounter.  CCM Care Plan  Allergies  Allergen Reactions   Buprenorphine Hcl Shortness Of Breath    Throat swelling/trouble breathing and lethargic   Morphine And Related Shortness Of Breath    Throat swelling/trouble breathing and lethargic   Celebrex [Celecoxib] Diarrhea and Swelling    Swelling of legs    Vioxx [Rofecoxib] Palpitations   Codeine Other (See Comments)    Bloated     Facility-Administered Encounter Medications as of 02/28/2021  Medication    stroke: mapping our early stages of recovery book   acetaminophen (TYLENOL) tablet 650 mg   Or   acetaminophen (TYLENOL) 160 MG/5ML solution 650 mg   Or   acetaminophen (TYLENOL) suppository 650 mg    aspirin EC tablet 81 mg   calcium-vitamin D (OSCAL WITH D) 500-200 MG-UNIT per tablet 1 tablet   cholecalciferol (VITAMIN D3) tablet 1,000 Units   diazepam (VALIUM) tablet 5 mg   donepezil (ARICEPT) tablet 10 mg   enoxaparin (LOVENOX) injection 40 mg   ferrous sulfate tablet 325 mg   gabapentin (NEURONTIN) capsule 100 mg   magnesium oxide (MAG-OX) tablet 400 mg   memantine (NAMENDA) tablet 5 mg   metoprolol tartrate (LOPRESSOR) tablet 25 mg   mometasone-formoterol (DULERA) 200-5 MCG/ACT inhaler 2 puff   multivitamin with minerals tablet 1 tablet   nitroGLYCERIN (NITROSTAT) SL tablet 0.4 mg   nystatin (MYCOSTATIN) 100000 UNIT/ML suspension 500,000 Units   oxybutynin (DITROPAN) tablet 2.5 mg   pantoprazole (PROTONIX) EC tablet 40 mg   QUEtiapine (SEROQUEL) tablet 50 mg   ringers solution   rosuvastatin (CRESTOR) tablet 40 mg   senna-docusate (Senokot-S) tablet 1 tablet   Outpatient Encounter Medications as of 02/28/2021  Medication Sig   aspirin EC 81 MG tablet Take 81 mg by mouth daily as needed (headache).   budesonide-formoterol (SYMBICORT) 160-4.5 MCG/ACT inhaler TAKE 2 PUFFS BY MOUTH TWICE A DAY (Patient taking differently: Inhale 2 puffs into the lungs in the morning and at bedtime.)   Calcium Citrate-Vitamin D (CALCIUM CITRATE+D3 PETITES PO) Take 1 tablet by mouth daily.   cholecalciferol (VITAMIN D) 25 MCG (1000 UNIT) tablet Take 1,000 Units by mouth daily.   ciclopirox (PENLAC) 8 % solution Apply 1 application topically at bedtime. Apply over nail and surrounding skin. Apply daily over previous coat. After seven (  7) days, file nail and continue cycle.   [employer] MAGNESIUM OXIDE 250 MG TABS TAKE 1 TABLET BY MOUTH WITH EVENING MEAL DAILY (Patient taking differently: Take 250 mg by mouth every evening.)   donepezil (ARICEPT) 10 MG tablet Take 1 tablet (10 mg total) by mouth at bedtime.   ferrous sulfate 325 (65 FE) MG tablet Take 325 mg by mouth daily with breakfast.   furosemide  (LASIX) 20 MG tablet Take 1 tablet by mouth Mon - Fri daily (Patient taking differently: Take 20 mg by mouth See admin instructions. Monday-friday)   gabapentin (NEURONTIN) 100 MG capsule Take 1 capsule (100 mg total) by mouth 2 (two) times daily.   ibuprofen (ADVIL) 200 MG tablet Take 400 mg by mouth every 6 (six) hours as needed (pain).   memantine (NAMENDA) 5 MG tablet Take 1 tablet (5 mg total) by mouth daily.   metoprolol tartrate (LOPRESSOR) 25 MG tablet Take 1 tablet (25 mg total) by mouth 2 (two) times daily.   Multiple Vitamin (MULTIVITAMIN WITH MINERALS) TABS tablet Take 1 tablet by mouth daily.   nitroGLYCERIN (NITROSTAT) 0.4 MG SL tablet PLACE ONE TABLET UNDER THE TONGUE EVERY 5 MINUTES AS NEEDED FOR CHEST PAIN. (Patient taking differently: Place 0.4 mg under the tongue every 5 (five) minutes as needed for chest pain.)   Omega-3 Fatty Acids (FISH OIL PO) Take 1 capsule by mouth daily.   oxybutynin (DITROPAN) 5 MG tablet Take 0.5 tablets (2.5 mg total) by mouth 2 (two) times daily.   pantoprazole (PROTONIX) 40 MG tablet Take 1 tablet (40 mg total) by mouth 2 (two) times daily.   polyethylene glycol (MIRALAX / GLYCOLAX) packet Take 17 g by mouth daily as needed for mild constipation.   Potassium Chloride ER 20 MEQ TBCR Take 2 tablets by mouth every morning. (Patient taking differently: Take 40 mEq by mouth every morning.)   QUEtiapine (SEROQUEL) 50 MG tablet TAKE 1 TABLET BY MOUTH EVERYDAY AT BEDTIME (Patient taking differently: Take 50 mg by mouth at bedtime.)   rosuvastatin (CRESTOR) 40 MG tablet Take 1 tablet (40 mg total) by mouth daily. (Patient taking differently: Take 40 mg by mouth at bedtime.)   traMADol (ULTRAM) 50 MG tablet TAKE 1 TABLET 3 TIMES A DAY AND CAN TAKE 1 EXTRA 20 DAYS/MONTH (Patient taking differently: Take 100 mg by mouth 2 (two) times daily.)    Patient Active Problem List   Diagnosis Date Noted   Gait disturbance 02/27/2021   GERD (gastroesophageal reflux  disease) 02/27/2021   Ambulatory dysfunction 02/27/2021   Transaminitis 02/27/2021   Fall (on)(from) incline, initial encounter 11/23/2020   Macrocytosis without anemia 11/22/2020   Unwitnessed fall 11/22/2020   Late onset Alzheimer's dementia with behavioral disturbance ([place]) 11/18/2020   Visual hallucination 11/18/2020   Sundowning 11/18/2020   Non-small cell carcinoma of right Fox, stage 1 ([place]) 06/08/2020   Pulmonary nodule 1 cm or greater in diameter 05/21/2020   Atherosclerotic heart disease of native coronary artery with other forms of angina pectoris ([place]) 04/22/2020   Memory loss 02/18/2019   Vitamin B12 deficiency 10/23/2018   Stage 3 chronic kidney disease ([place]) 06/03/2018   Chronic anemia 06/03/2018   Hypercholesterolemia 06/03/2018   Malnutrition of moderate degree 07/12/2017   Hypoalbuminemia 07/11/2017   Elevated LFTs 07/11/2017   Snoring 12/21/2016   Peripheral musculoskeletal gait disorder 02/05/2015   Lumbar post-laminectomy syndrome 02/05/2015   CAD in native artery 10/16/2014   NSTEMI (non-ST elevated myocardial infarction) ([place]) 09/13/2014   Cardiomyopathy,  ischemic 03/17/1593   Chronic systolic heart failure ([place])    Tobacco abuse 09/12/2014   Dyslipidemia 09/12/2014   Normocytic anemia 09/12/2014   Chronic diastolic heart failure, NYHA class 1 ([place]) 09/12/2014   Postlaminectomy syndrome, cervical region 08/01/2013   Chronic midline low back pain with left-sided sciatica 08/01/2013   Shoulder joint contracture 08/01/2013   HTN (hypertension) 03/03/2013   Abnormal genetic test 03/03/2013    Conditions to be addressed/monitored: CHF, HTN, CKD Stage III, and Alzheimer's Disease ; Level of care concerns  Care Plan : Social Work [facility] Care Plan  Updates made by [name] since 02/28/2021 12:00 AM     Problem: Disease Progression      Goal: Disease Progression Managed   Start Date: 11/12/2020  Priority: High  Note:   Current Barriers:  Chronic  disease management support and education needs related to CHF, HTN, CKD Stage III, and Mild Cognitive Impairment   Increased Agitation  Social Worker Clinical Goal(s):  patient will work with SW to identify and address any acute and/or chronic care coordination needs related to the self health management of CHF, HTN, CKD Stage III, and Mild Cognitive Impairment   Patient and her son will work with SW to identify long term care options Patient, with the help of her son, will visit the ED to evaluate cause of increased agitation and combativeness Goal not met  SW Interventions:  Inter-disciplinary care team collaboration (see longitudinal plan of care) Collaboration with [name], FNP regarding development and update of comprehensive plan of care as evidenced by provider attestation and co-signature Received notification patient is currently in the ED at [facility] due to [place] Performed chart review to note patient is currently in the ED with plans to admit for observation Collaboration with [place] to advise patient is actively involved with embedded chronic care management team - also advised Mrs. Brewer patient has an active APS investigation   Patient Goals/Self-Care Activities patient will: with the help of her son  -  Engage with home health -Call 911 for assistance with increased agitation -Complete and submit Medicaid application -Contact SW as needed prior to next scheduled call  Follow Up Plan:  SW will continue to follow       Follow Up Plan:  SW will continue to follow. RN Care Manager made aware of patient current disposition.      [name], BSW, CDP Social Worker, Certified Dementia Practitioner [place] / [place] [facility] [phone]

## 2021-03-01 ENCOUNTER — Ambulatory Visit: Payer: Self-pay

## 2021-03-01 ENCOUNTER — Telehealth: Payer: Medicare Other

## 2021-03-01 DIAGNOSIS — I10 Essential (primary) hypertension: Secondary | ICD-10-CM

## 2021-03-01 DIAGNOSIS — I5042 Chronic combined systolic (congestive) and diastolic (congestive) heart failure: Secondary | ICD-10-CM | POA: Diagnosis not present

## 2021-03-01 DIAGNOSIS — I130 Hypertensive heart and chronic kidney disease with heart failure and stage 1 through stage 4 chronic kidney disease, or unspecified chronic kidney disease: Secondary | ICD-10-CM | POA: Diagnosis not present

## 2021-03-01 DIAGNOSIS — N183 Chronic kidney disease, stage 3 unspecified: Secondary | ICD-10-CM | POA: Diagnosis not present

## 2021-03-01 DIAGNOSIS — R2689 Other abnormalities of gait and mobility: Secondary | ICD-10-CM | POA: Diagnosis not present

## 2021-03-01 DIAGNOSIS — G3184 Mild cognitive impairment, so stated: Secondary | ICD-10-CM

## 2021-03-01 DIAGNOSIS — G301 Alzheimer's disease with late onset: Secondary | ICD-10-CM

## 2021-03-01 DIAGNOSIS — I25118 Atherosclerotic heart disease of native coronary artery with other forms of angina pectoris: Secondary | ICD-10-CM

## 2021-03-01 DIAGNOSIS — Z7982 Long term (current) use of aspirin: Secondary | ICD-10-CM | POA: Diagnosis not present

## 2021-03-01 DIAGNOSIS — F02818 Dementia in other diseases classified elsewhere, unspecified severity, with other behavioral disturbance: Secondary | ICD-10-CM

## 2021-03-01 DIAGNOSIS — Z79899 Other long term (current) drug therapy: Secondary | ICD-10-CM | POA: Diagnosis not present

## 2021-03-01 DIAGNOSIS — N1831 Chronic kidney disease, stage 3a: Secondary | ICD-10-CM

## 2021-03-01 DIAGNOSIS — D631 Anemia in chronic kidney disease: Secondary | ICD-10-CM | POA: Diagnosis not present

## 2021-03-01 DIAGNOSIS — I5032 Chronic diastolic (congestive) heart failure: Secondary | ICD-10-CM

## 2021-03-01 DIAGNOSIS — C3491 Malignant neoplasm of unspecified part of right bronchus or lung: Secondary | ICD-10-CM

## 2021-03-01 DIAGNOSIS — Z20822 Contact with and (suspected) exposure to covid-19: Secondary | ICD-10-CM | POA: Diagnosis not present

## 2021-03-01 DIAGNOSIS — R269 Unspecified abnormalities of gait and mobility: Secondary | ICD-10-CM | POA: Diagnosis not present

## 2021-03-01 DIAGNOSIS — Z85118 Personal history of other malignant neoplasm of bronchus and lung: Secondary | ICD-10-CM | POA: Diagnosis not present

## 2021-03-01 DIAGNOSIS — F17210 Nicotine dependence, cigarettes, uncomplicated: Secondary | ICD-10-CM | POA: Diagnosis not present

## 2021-03-01 LAB — BASIC METABOLIC PANEL
Anion gap: 6 (ref 5–15)
BUN: 14 mg/dL (ref 8–23)
CO2: 25 mmol/L (ref 22–32)
Calcium: 8.8 mg/dL — ABNORMAL LOW (ref 8.9–10.3)
Chloride: 108 mmol/L (ref 98–111)
Creatinine, Ser: 0.98 mg/dL (ref 0.44–1.00)
GFR, Estimated: 58 mL/min — ABNORMAL LOW (ref >60)
Glucose, Bld: 95 mg/dL (ref 70–99)
Potassium: 3.5 mmol/L (ref 3.5–5.1)
Sodium: 139 mmol/L (ref 135–145)

## 2021-03-01 LAB — CBC
HCT: 31.5 % — ABNORMAL LOW (ref 36.0–46.0)
Hemoglobin: 9.9 g/dL — ABNORMAL LOW (ref 12.0–15.0)
MCH: 30.3 pg (ref 26.0–34.0)
MCHC: 31.4 g/dL (ref 30.0–36.0)
MCV: 96.3 fL (ref 80.0–100.0)
Platelets: 295 10*3/uL (ref 150–400)
RBC: 3.27 MIL/uL — ABNORMAL LOW (ref 3.87–5.11)
RDW: 13.3 % (ref 11.5–15.5)
WBC: 4.4 10*3/uL (ref 4.0–10.5)
nRBC: 0 % (ref 0.0–0.2)

## 2021-03-01 LAB — COMPREHENSIVE METABOLIC PANEL
ALT: 25 U/L (ref 0–44)
AST: 25 U/L (ref 15–41)
Albumin: 2.4 g/dL — ABNORMAL LOW (ref 3.5–5.0)
Alkaline Phosphatase: 138 U/L — ABNORMAL HIGH (ref 38–126)
Anion gap: 5 (ref 5–15)
BUN: 14 mg/dL (ref 8–23)
CO2: 23 mmol/L (ref 22–32)
Calcium: 8.5 mg/dL — ABNORMAL LOW (ref 8.9–10.3)
Chloride: 107 mmol/L (ref 98–111)
Creatinine, Ser: 1.07 mg/dL — ABNORMAL HIGH (ref 0.44–1.00)
GFR, Estimated: 52 mL/min — ABNORMAL LOW (ref >60)
Glucose, Bld: 104 mg/dL — ABNORMAL HIGH (ref 70–99)
Potassium: 4 mmol/L (ref 3.5–5.1)
Sodium: 135 mmol/L (ref 135–145)
Total Bilirubin: 0.4 mg/dL (ref 0.3–1.2)
Total Protein: 5.4 g/dL — ABNORMAL LOW (ref 6.5–8.1)

## 2021-03-01 LAB — MAGNESIUM
Magnesium: 2 mg/dL (ref 1.7–2.4)
Magnesium: 1.9 mg/dL (ref 1.7–2.4)
Magnesium: 2 mg/dL (ref 1.7–2.4)

## 2021-03-01 LAB — POTASSIUM: Potassium: 3.9 mmol/L (ref 3.5–5.1)

## 2021-03-01 NOTE — Progress Notes (Signed)
Tele call saying pt. Had 13 beats of SVT. Vitals taken and stable. Nurse paged provider on call. Night nurse notified.

## 2021-03-01 NOTE — Chronic Care Management (AMB) (Signed)
Chronic Care Management   CCM RN Visit Note  03/01/2021 Name: Sara Fox MRN: [id number] DOB: [birth year]  Subjective: Sara Fox is a 80 y.o. year old female who is a primary care patient of [name], [place]. The care management team was consulted for assistance with disease management and care coordination needs.    Engaged with patient by telephone for follow up visit in response to provider referral for case management and/or care coordination services.   Consent to Services:  The patient was given information about Chronic Care Management services, agreed to services, and gave verbal consent prior to initiation of services.  Please see initial visit note for detailed documentation.   Patient agreed to services and verbal consent obtained.   Assessment: Review of patient past medical history, allergies, medications, health status, including review of consultants reports, laboratory and other test data, was performed as part of comprehensive evaluation and provision of chronic care management services.   SDOH (Social Determinants of Health) assessments and interventions performed:    CCM Care Plan  Allergies  Allergen Reactions   Buprenorphine Hcl Shortness Of Breath    Throat swelling/trouble breathing and lethargic   Morphine And Related Shortness Of Breath    Throat swelling/trouble breathing and lethargic   Celebrex [Celecoxib] Diarrhea and Swelling    Swelling of legs    Vioxx [Rofecoxib] Palpitations   Codeine Other (See Comments)    Bloated     Facility-Administered Encounter Medications as of 03/01/2021  Medication    stroke: mapping our early stages of recovery book   acetaminophen (TYLENOL) tablet 650 mg   Or   acetaminophen (TYLENOL) 160 MG/5ML solution 650 mg   Or   acetaminophen (TYLENOL) suppository 650 mg   aspirin EC tablet 81 mg   calcium-vitamin D (OSCAL WITH D) 500-200 MG-UNIT per tablet 1 tablet   cholecalciferol (VITAMIN D3) tablet 1,000  Units   diazepam (VALIUM) tablet 5 mg   donepezil (ARICEPT) tablet 10 mg   enoxaparin (LOVENOX) injection 40 mg   ferrous sulfate tablet 325 mg   gabapentin (NEURONTIN) capsule 100 mg   magnesium oxide (MAG-OX) tablet 400 mg   memantine (NAMENDA) tablet 5 mg   metoprolol tartrate (LOPRESSOR) tablet 25 mg   mometasone-formoterol (DULERA) 200-5 MCG/ACT inhaler 2 puff   multivitamin with minerals tablet 1 tablet   nitroGLYCERIN (NITROSTAT) SL tablet 0.4 mg   nystatin (MYCOSTATIN) 100000 UNIT/ML suspension 500,000 Units   oxybutynin (DITROPAN) tablet 2.5 mg   pantoprazole (PROTONIX) EC tablet 40 mg   QUEtiapine (SEROQUEL) tablet 50 mg   rosuvastatin (CRESTOR) tablet 40 mg   senna-docusate (Senokot-S) tablet 1 tablet   Outpatient Encounter Medications as of 03/01/2021  Medication Sig   aspirin EC 81 MG tablet Take 81 mg by mouth daily as needed (headache).   budesonide-formoterol (SYMBICORT) 160-4.5 MCG/ACT inhaler TAKE 2 PUFFS BY MOUTH TWICE A DAY (Patient taking differently: Inhale 2 puffs into the lungs in the morning and at bedtime.)   Calcium Citrate-Vitamin D (CALCIUM CITRATE+D3 PETITES PO) Take 1 tablet by mouth daily.   cholecalciferol (VITAMIN D) 25 MCG (1000 UNIT) tablet Take 1,000 Units by mouth daily.   ciclopirox (PENLAC) 8 % solution Apply 1 application topically at bedtime. Apply over nail and surrounding skin. Apply daily over previous coat. After seven (7) days, file nail and continue cycle.   [employer]S MAGNESIUM OXIDE 250 MG TABS TAKE 1 TABLET BY MOUTH WITH EVENING MEAL DAILY (Patient taking differently: Take 250 mg by  mouth every evening.)   donepezil (ARICEPT) 10 MG tablet Take 1 tablet (10 mg total) by mouth at bedtime.   ferrous sulfate 325 (65 FE) MG tablet Take 325 mg by mouth daily with breakfast.   furosemide (LASIX) 20 MG tablet Take 1 tablet by mouth Mon - Fri daily (Patient taking differently: Take 20 mg by mouth See admin instructions. Monday-friday)   gabapentin  (NEURONTIN) 100 MG capsule Take 1 capsule (100 mg total) by mouth 2 (two) times daily.   ibuprofen (ADVIL) 200 MG tablet Take 400 mg by mouth every 6 (six) hours as needed (pain).   memantine (NAMENDA) 5 MG tablet Take 1 tablet (5 mg total) by mouth daily.   metoprolol tartrate (LOPRESSOR) 25 MG tablet Take 1 tablet (25 mg total) by mouth 2 (two) times daily.   Multiple Vitamin (MULTIVITAMIN WITH MINERALS) TABS tablet Take 1 tablet by mouth daily.   nitroGLYCERIN (NITROSTAT) 0.4 MG SL tablet PLACE ONE TABLET UNDER THE TONGUE EVERY 5 MINUTES AS NEEDED FOR CHEST PAIN. (Patient taking differently: Place 0.4 mg under the tongue every 5 (five) minutes as needed for chest pain.)   Omega-3 Fatty Acids (FISH OIL PO) Take 1 capsule by mouth daily.   oxybutynin (DITROPAN) 5 MG tablet Take 0.5 tablets (2.5 mg total) by mouth 2 (two) times daily.   pantoprazole (PROTONIX) 40 MG tablet Take 1 tablet (40 mg total) by mouth 2 (two) times daily.   polyethylene glycol (MIRALAX / GLYCOLAX) packet Take 17 g by mouth daily as needed for mild constipation.   Potassium Chloride ER 20 MEQ TBCR Take 2 tablets by mouth every morning. (Patient taking differently: Take 40 mEq by mouth every morning.)   QUEtiapine (SEROQUEL) 50 MG tablet TAKE 1 TABLET BY MOUTH EVERYDAY AT BEDTIME (Patient taking differently: Take 50 mg by mouth at bedtime.)   rosuvastatin (CRESTOR) 40 MG tablet Take 1 tablet (40 mg total) by mouth daily. (Patient taking differently: Take 40 mg by mouth at bedtime.)   traMADol (ULTRAM) 50 MG tablet TAKE 1 TABLET 3 TIMES A DAY AND CAN TAKE 1 EXTRA 20 DAYS/MONTH (Patient taking differently: Take 100 mg by mouth 2 (two) times daily.)    Patient Active Problem List   Diagnosis Date Noted   Gait disturbance 02/27/2021   GERD (gastroesophageal reflux disease) 02/27/2021   Ambulatory dysfunction 02/27/2021   Transaminitis 02/27/2021   Fall (on)(from) incline, initial encounter 11/23/2020   Macrocytosis without  anemia 11/22/2020   Unwitnessed fall 11/22/2020   Late onset Alzheimer's dementia with behavioral disturbance ([place]) 11/18/2020   Visual hallucination 11/18/2020   Sundowning 11/18/2020   Non-small cell carcinoma of right lung, stage 1 ([place]) 06/08/2020   Pulmonary nodule 1 cm or greater in diameter 05/21/2020   Atherosclerotic heart disease of native coronary artery with other forms of angina pectoris ([place]) 04/22/2020   Memory loss 02/18/2019   Vitamin B12 deficiency 10/23/2018   Stage 3 chronic kidney disease ([place]) 06/03/2018   Chronic anemia 06/03/2018   Hypercholesterolemia 06/03/2018   Malnutrition of moderate degree 07/12/2017   Hypoalbuminemia 07/11/2017   Elevated LFTs 07/11/2017   Snoring 12/21/2016   Peripheral musculoskeletal gait disorder 02/05/2015   Lumbar post-laminectomy syndrome 02/05/2015   CAD in native artery 10/16/2014   NSTEMI (non-ST elevated myocardial infarction) ([place]) 09/13/2014   Cardiomyopathy, ischemic 23/53/6144   Chronic systolic heart failure ([place])    Tobacco abuse 09/12/2014   Dyslipidemia 09/12/2014   Normocytic anemia 09/12/2014   Chronic diastolic heart failure, NYHA  class 1 ([place]) 09/12/2014   Postlaminectomy syndrome, cervical region 08/01/2013   Chronic midline low back pain with left-sided sciatica 08/01/2013   Shoulder joint contracture 08/01/2013   HTN (hypertension) 03/03/2013   Abnormal genetic test 03/03/2013    Conditions to be addressed/monitored: Stage 3a chronic kidney disease, HTN, chronic systolic heart failure, Mild Cognitive Impairment, Atherosclerotic heart disease of native coronary artery with other forms of angina pectoris, Lung Nodule, 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    Care Plan : Wellness (Adult)  Updates made by Lynne Logan, RN since 03/01/2021 12:00 AM     Problem: Medication Adherence (Wellness)   Priority: High     Goal: Medication Adherence and Medical Management of Chronic Heatl[place]  Conditions Maintained   Start Date: 12/24/2020  Expected End Date: 04/13/2021  Recent Progress: On track  Priority: High  Note:   Current Barriers:  Ineffective Self Health Maintenance in a patient with Stage 3a chronic kidney disease, HTN, chronic systolic heart failure, Mild Cognitive Impairment, Atherosclerotic heart disease of native coronary artery with other forms of angina pectoris, Lung Nodule   Clinical Goal(s):  Collaboration with [name], FNP regarding development and update of comprehensive plan of care as evidenced by provider attestation and co-signature Inter-disciplinary care team collaboration (see longitudinal plan of care) patient will work with care management team to address care coordination and chronic disease management needs related to Disease Management Educational Needs Care Coordination Medication Management and Education Psychosocial Support Dementia and Caregiver Support   Interventions:  03/01/21 completed successful outbound call with son [name] Determined patient was admitted to [facility] on 02/26/21 for dx: Ataxia Per patient's son, patient was negative for UTI and negative for Stroke Discussed patient will be discharged to inpatient rehab once ready for discharge Educated son regarding advanced stages of Dementia including difficulty with balance and coordination resulting in increased falls, decreased mobility and the inability to perform self care  Discussed [name] will plan to have his mother discharged home following rehab at which time he will continue to locate a provider who will take over patient's medical management and make house calls  Discussed plans with patient for ongoing care management follow up and provided patient with direct contact information for care management team Self Care Activities:  With assistance from son [name], take all medications as prescribed Contact PCP and or embedded Pharm D for questions  regarding medications or medication refills Ensure refills are on the way when medication supply is getting low Notify PCP if unable to take medications as prescribed  Patient Goals: - pending hospital discharge   Follow Up Plan: The care management team will reach out to the patient/son again upon patient's discharge home        Follow Up Plan: The care management team will reach out to the patient/son again upon patient's discharge home     Barb Merino, RN, BSN, CCM Care Management Coordinator [place] [facility]  Direct Phone: [phone]

## 2021-03-01 NOTE — Progress Notes (Signed)
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   Per family for the last 2 weeks patient has been "rebellious", not cleaning herself, confusion, unable to go to the bathroom by herself.  03/01/2021: Patient seen and examined at her bedside.  Minimally interactive.  She has no new complaints.   Assessment & Plan:   Principal Problem:   Gait disturbance Active Problems:   HTN (hypertension)   Tobacco abuse   Dyslipidemia   Chronic diastolic heart failure, NYHA class 1 (HCC)   Chronic systolic heart failure (HCC)   Stage 3 chronic kidney disease (HCC)   Chronic anemia   Non-small cell carcinoma of right lung, stage 1 (HCC)   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)   Sundowning   Macrocytosis without anemia   GERD (gastroesophageal reflux disease)   Ambulatory dysfunction   Transaminitis  Gait disturbances/worsening confusion: -MRI brain negative for acute stroke -MRI lumbar  spine: L4-S1 PLIF with unchanged moderate bilateral L5-S1 neural foraminal stenosis. Unchanged mild bilateral L3-4 neural foraminal stenosis. -B12 normal. -Ammonia 27.  -CO2 not significantly elevated.  -UA negative for infection PT OT assessment recommended SNF. TOC assisting with placement. Continue fall precautions  Nonsustained 11 beat V. Tach She denies any anginal symptoms Optimize magnesium and potassium levels.  Elevated liver chemistries, improving LFTs are downtrending.   Continue to monitor. Avoid hepatotoxic agents.  Ambulatory dysfunction:  PT OT ordered  Chronic back pain:  MRI  of lumbar spine unchanged moderate bilateral L5-S1 foraminal stenosis: Unchanged mild bilateral L3-L4 neuroforaminal stenosis: Continue with gabapentin Hold schedule tramadol patient is lethargic  Stage III bCKD: Cr range 1.3 Monitor.   Alzheimer's dementia with behavioral disturbance Continue with Aricept and Namenda  Chronic systolic diastolic heart failure:  Euvolemic on exam.  Continue metoprolol.  She has received low rate IV fluids.    Tobacco abuse:  Continue to encourage tobacco cessation. Bronchodilators as needed.  Dyslipidemia:  Continue with the statins  Sacral ulcer, POA:  Present on admission: Wound care consulted Continue local wound care.  Dysphagia 3 with speech therapist Follow speech therapist recommendations Aspiration precautions.    Estimated body mass index is 21.11 kg/m as calculated from the following:   Height as of this encounter: 5\' 4"  (1.626 m).   Weight as of this encounter: 55.8 kg.   DVT prophylaxis: Lovenox subcu daily. Code Status: DNR Family Communication: Disposition Plan:  Status is: Observation  The patient remains OBS appropriate and will d/c before 2 midnights.       Consultants:  none  Procedures:  None.  Antimicrobials:  None.   Objective: Vitals:   03/01/21 0520 03/01/21 0642 03/01/21 0808 03/01/21 0939  BP: 137/67 (!) 143/74  (!) 123/45  Pulse: 72 69  66  Resp: 16 17  16   Temp: 98 F (36.7 C) 97.9 F (36.6 C)  97.6 F (36.4 C)  TempSrc: Oral Oral  Oral  SpO2: 98% 96% 97% 99%  Weight:      Height:        Intake/Output Summary (Last 24 hours) at 03/01/2021 1533 Last data filed at 03/01/2021 0915 Gross per 24 hour  Intake 660  ml  Output 800 ml  Net -140 ml   Filed Weights   02/27/21 0343  Weight: 55.8 kg    Examination:  General exam: Well-developed well-nourished in no acute distress.  She is somnolent but easily arousable to voices.  Minimally interactive.   Respiratory system: Clear  to auscultation with no wheezes or rales. Cardiovascular system: Regular rate and rhythm no rubs or gallops.   Gastrointestinal system: Abdomen soft nontender normal bowel sounds.  Nondistended.   Central nervous system: Somnolent but easily arousable to voices.  Extremities: Bilateral trace lower extremity edema. Skin: Left gluteal pressure wound, POA. Psych: Unable to assess mood due to somnolence.   Data Reviewed: I have personally reviewed following labs and imaging studies  CBC: Recent Labs  Lab 02/26/21 1900 02/27/21 0514 03/01/21 0427  WBC 5.8  --  4.4  NEUTROABS 4.3  --   --   HGB 10.6* 10.5* 9.9*  HCT 35.9* 31.0* 31.5*  MCV 100.6*  --  96.3  PLT 317  --  902   Basic Metabolic Panel: Recent Labs  Lab 02/26/21 1900 02/27/21 0514 03/01/21 0427  NA 141 141 139  K 4.8 4.7 3.5  CL 105  --  108  CO2 27  --  25  GLUCOSE 139*  --  95  BUN 37*  --  14  CREATININE 1.20*  --  0.98  CALCIUM 9.3  --  8.8*  MG  --   --  2.0   GFR: Estimated Creatinine Clearance: 38.9 mL/min (by C-G formula based on SCr of 0.98 mg/dL). Liver Function Tests: Recent Labs  Lab 02/26/21 1900  AST 43*  ALT 54*  ALKPHOS 155*  BILITOT 0.5  PROT 6.4*  ALBUMIN 3.1*   No results for input(s): LIPASE, AMYLASE in the last 168 hours. Recent Labs  Lab 02/27/21 1026  AMMONIA 27   Coagulation Profile: No results for input(s): INR, PROTIME in the last 168 hours. Cardiac Enzymes: Recent Labs  Lab 02/28/21 1724  CKTOTAL 272*   BNP (last 3 results) No results for input(s): PROBNP in the last 8760 hours. HbA1C: No results for input(s): HGBA1C in the last 72 hours. CBG: Recent Labs  Lab 02/27/21 1920 02/28/21 1231  GLUCAP 83 101*   Lipid Profile: No results for input(s): CHOL, HDL, LDLCALC, TRIG, CHOLHDL, LDLDIRECT in the last 72 hours. Thyroid Function Tests: No results for input(s): TSH, T4TOTAL, FREET4, T3FREE, THYROIDAB in the last 72 hours. Anemia Panel: Recent Labs     02/28/21 1724  VITAMINB12 252   Sepsis Labs: No results for input(s): PROCALCITON, LATICACIDVEN in the last 168 hours.  Recent Results (from the past 240 hour(s))  Resp Panel by RT-PCR (Flu A&B, Covid) Nasopharyngeal Swab     Status: None   Collection Time: 02/27/21  1:22 PM   Specimen: Nasopharyngeal Swab; Nasopharyngeal(NP) swabs in vial transport medium  Result Value Ref Range Status   SARS Coronavirus 2 by RT PCR NEGATIVE NEGATIVE Final    Comment: (NOTE) SARS-CoV-2 target nucleic acids are NOT DETECTED.  The SARS-CoV-2 RNA is generally detectable in upper respiratory specimens during the acute phase of infection. The lowest concentration of SARS-CoV-2 viral copies this assay can detect is 138 copies/mL. A negative result does not preclude SARS-Cov-2 infection and should not be used as the sole basis for treatment or other patient management decisions. A negative result may occur with  improper specimen collection/handling, submission of specimen other than nasopharyngeal swab, presence of viral mutation(s) within the  areas targeted by this assay, and inadequate number of viral copies(<138 copies/mL). A negative result must be combined with clinical observations, patient history, and epidemiological information. The expected result is Negative.  Fact Sheet for Patients:  EntrepreneurPulse.com.au  Fact Sheet for Healthcare Providers:  IncredibleEmployment.be  This test is no t yet approved or cleared by the [place] FDA and  has been authorized for detection and/or diagnosis of SARS-CoV-2 by FDA under an Emergency Use Authorization (EUA). This EUA will remain  in effect (meaning this test can be used) for the duration of the COVID-19 declaration under Section 564(b)(1) of the Act, 21 U.S.C.section 360bbb-3(b)(1), unless the authorization is terminated  or revoked sooner.       Influenza A by PCR NEGATIVE NEGATIVE Final    Influenza B by PCR NEGATIVE NEGATIVE Final    Comment: (NOTE) The Xpert Xpress SARS-CoV-2/FLU/RSV plus assay is intended as an aid in the diagnosis of influenza from Nasopharyngeal swab specimens and should not be used as a sole basis for treatment. Nasal washings and aspirates are unacceptable for Xpert Xpress SARS-CoV-2/FLU/RSV testing.  Fact Sheet for Patients: EntrepreneurPulse.com.au  Fact Sheet for Healthcare Providers: IncredibleEmployment.be  This test is not yet approved or cleared by the [place] FDA and has been authorized for detection and/or diagnosis of SARS-CoV-2 by FDA under an Emergency Use Authorization (EUA). This EUA will remain in effect (meaning this test can be used) for the duration of the COVID-19 declaration under Section 564(b)(1) of the Act, 21 U.S.C. section 360bbb-3(b)(1), unless the authorization is terminated or revoked.  Performed at [facility], [place] [address], [place], [place] [address]          Radiology Studies: MR BRAIN WO CONTRAST  Result Date: 02/27/2021 CLINICAL DATA:  Acute neurologic deficit EXAM: MRI HEAD WITHOUT CONTRAST TECHNIQUE: Multiplanar, multiecho pulse sequences of the brain and surrounding structures were obtained without intravenous contrast. COMPARISON:  06/21/2020 FINDINGS: Brain: No acute infarct, mass effect or extra-axial collection. No acute or chronic hemorrhage. There is multifocal hyperintense T2-weighted signal within the white matter. Generalized volume loss without a clear lobar predilection. The midline structures are normal. Vascular: Major flow voids are preserved. Skull and upper cervical spine: Normal calvarium and skull base. Visualized upper cervical spine and soft tissues are normal. Sinuses/Orbits:No paranasal sinus fluid levels or advanced mucosal thickening. No mastoid or middle ear effusion. Normal orbits. IMPRESSION: 1. No acute intracranial abnormality. 2.  Findings of chronic small vessel ischemia and volume loss. Electronically Signed   By: Ulyses Jarred M.D.   On: 02/27/2021 19:21   MR LUMBAR SPINE WO CONTRAST  Result Date: 02/27/2021 CLINICAL DATA:  Ataxia EXAM: MRI LUMBAR SPINE WITHOUT CONTRAST TECHNIQUE: Multiplanar, multisequence MR imaging of the lumbar spine was performed. No intravenous contrast was administered. COMPARISON:  03/26/2010 FINDINGS: Segmentation:  Standard. Alignment:  Grade 1 anterolisthesis at L4-5 and L5-S1 Vertebrae:  L4-S1 PLIF.  No acute abnormality. Conus medullaris and cauda equina: Conus extends to the L2 level. Conus and cauda equina appear normal. Paraspinal and other soft tissues: Negative Disc levels: L1-L2: Normal disc space and facet joints. No spinal canal stenosis. No neural foraminal stenosis. L2-L3: Normal disc space and facet joints. No spinal canal stenosis. No neural foraminal stenosis. L3-L4: Small disc bulge. No spinal canal stenosis. Mild bilateral neural foraminal stenosis. L4-L5: Grade 1 anterolisthesis. PLIF. No spinal canal stenosis. No neural foraminal stenosis. L5-S1: Grade 1 anterolisthesis. PLIF. No spinal canal stenosis. Unchanged moderate bilateral neural foraminal stenosis.  Visualized sacrum: Normal. IMPRESSION: 1. L4-S1 PLIF with unchanged moderate bilateral L5-S1 neural foraminal stenosis. 2. Unchanged mild bilateral L3-4 neural foraminal stenosis. Electronically Signed   By: Ulyses Jarred M.D.   On: 02/27/2021 19:27        Scheduled Meds:   stroke: mapping our early stages of recovery book   Does not apply Once   aspirin EC  81 mg Oral Daily   calcium-vitamin D  1 tablet Oral Daily   cholecalciferol  1,000 Units Oral Daily   diazepam  5 mg Oral Once   donepezil  10 mg Oral QHS   enoxaparin (LOVENOX) injection  40 mg Subcutaneous Q24H   ferrous sulfate  325 mg Oral Q breakfast   gabapentin  100 mg Oral BID   magnesium oxide  400 mg Oral QPM   memantine  5 mg Oral Daily   metoprolol  tartrate  25 mg Oral BID   mometasone-formoterol  2 puff Inhalation BID   multivitamin with minerals  1 tablet Oral Daily   nystatin  5 mL Oral QID   oxybutynin  2.5 mg Oral BID   pantoprazole  40 mg Oral BID   QUEtiapine  50 mg Oral QHS   rosuvastatin  40 mg Oral QHS   Continuous Infusions:     LOS: 0 days    Time spent: 35 minutes    Kayleen Memos, MD [facility]   If 7PM-7AM, please contact night-coverage [email or web address]  03/01/2021, 3:33 PM

## 2021-03-01 NOTE — Progress Notes (Signed)
Patient had an 11 beat Vtach,non sustained. VSS. Patient sleeping at this time. NAD noted. MD,Rath[place] notified via text page. Order received in epic. Will continue to monitor.

## 2021-03-01 NOTE — Patient Instructions (Addendum)
Visit Information  PATIENT GOALS:  Goals Addressed      Medication Adherence and Medical Management of Chronic Health Conditions Maintained   On track    Timeframe:  Long-Range Goal Priority:  High Start Date: 12/24/20                            Expected End Date: 04/13/21  Follow Up Plan: The care management team will reach out to the patient/son again upon patient's discharge home     Patient Goals: - pending hospital discharge                      The patient verbalized understanding of instructions, educational materials, and care plan provided today and declined offer to receive copy of patient instructions, educational materials, and care plan.   Follow Up Plan: The care management team will reach out to the patient/son again upon patient's discharge home      Barb Merino, RN, BSN, CCM Care Management Coordinator [facility]  Direct Phone: [phone]

## 2021-03-02 ENCOUNTER — Ambulatory Visit: Payer: Self-pay

## 2021-03-02 ENCOUNTER — Telehealth: Payer: Medicare Other

## 2021-03-02 DIAGNOSIS — I10 Essential (primary) hypertension: Secondary | ICD-10-CM

## 2021-03-02 DIAGNOSIS — I5032 Chronic diastolic (congestive) heart failure: Secondary | ICD-10-CM

## 2021-03-02 DIAGNOSIS — G301 Alzheimer's disease with late onset: Secondary | ICD-10-CM

## 2021-03-02 DIAGNOSIS — R269 Unspecified abnormalities of gait and mobility: Secondary | ICD-10-CM | POA: Diagnosis not present

## 2021-03-02 DIAGNOSIS — N1831 Chronic kidney disease, stage 3a: Secondary | ICD-10-CM

## 2021-03-02 LAB — CBC
HCT: 31.4 % — ABNORMAL LOW (ref 36.0–46.0)
Hemoglobin: 9.5 g/dL — ABNORMAL LOW (ref 12.0–15.0)
MCH: 29.7 pg (ref 26.0–34.0)
MCHC: 30.3 g/dL (ref 30.0–36.0)
MCV: 98.1 fL (ref 80.0–100.0)
Platelets: 282 10*3/uL (ref 150–400)
RBC: 3.2 MIL/uL — ABNORMAL LOW (ref 3.87–5.11)
RDW: 13.6 % (ref 11.5–15.5)
WBC: 4.9 10*3/uL (ref 4.0–10.5)
nRBC: 0 % (ref 0.0–0.2)

## 2021-03-02 LAB — PHOSPHORUS: Phosphorus: 3 mg/dL (ref 2.5–4.6)

## 2021-03-02 NOTE — Patient Instructions (Signed)
Social Worker Visit Information  Goals we discussed today:   Goals Addressed             This Visit's Progress    COMPLETED: Disease Progression Managed       Timeframe:  Short-Term Goal Priority:  High Start Date:  7.1.22                                            Patient Goals/Self-Care Activities patient will: with the help of her son -Engage with RN Care Manager to address care management needs         Materials Provided: No. Patient not reached.   Follow Up Plan:  No SW follow up planned. Please contact me as needed.  [name], BSW, CDP Social Worker, Certified Dementia Practitioner [place] [facility] [phone]

## 2021-03-02 NOTE — Chronic Care Management (AMB) (Signed)
Chronic Care Management    Social Work Note  03/02/2021 Name: Sara Fox MRN: [id number] DOB: Sep 07, 1940  Sara Fox is a 80 y.o. year old female who is a primary care patient of [name], [place]. The CCM team was consulted to assist the patient with chronic disease management and/or care coordination needs related to:  HTN, CHF, CKD III, Late Onset Alzheimer's Disease .   Collaboration with RN Care Manager  for  case discussion  in response to provider referral for social work chronic care management and care coordination services.   Consent to Services:  The patient was given information about Chronic Care Management services, agreed to services, and gave verbal consent prior to initiation of services.  Please see initial visit note for detailed documentation.   Patient agreed to services and consent obtained.   Assessment: Review of patient past medical history, allergies, medications, and health status, including review of relevant consultants reports was performed today as part of a comprehensive evaluation and provision of chronic care management and care coordination services.     SDOH (Social Determinants of Health) assessments and interventions performed:    Advanced Directives Status: Not addressed in this encounter.  CCM Care Plan  Allergies  Allergen Reactions   Buprenorphine Hcl Shortness Of Breath    Throat swelling/trouble breathing and lethargic   Morphine And Related Shortness Of Breath    Throat swelling/trouble breathing and lethargic   Celebrex [Celecoxib] Diarrhea and Swelling    Swelling of legs    Vioxx [Rofecoxib] Palpitations   Codeine Other (See Comments)    Bloated     Facility-Administered Encounter Medications as of 03/02/2021  Medication    stroke: mapping our early stages of recovery book   acetaminophen (TYLENOL) tablet 650 mg   Or   acetaminophen (TYLENOL) 160 MG/5ML solution 650 mg   Or   acetaminophen (TYLENOL) suppository 650  mg   aspirin EC tablet 81 mg   calcium-vitamin D (OSCAL WITH D) 500-200 MG-UNIT per tablet 1 tablet   cholecalciferol (VITAMIN D3) tablet 1,000 Units   diazepam (VALIUM) tablet 5 mg   donepezil (ARICEPT) tablet 10 mg   enoxaparin (LOVENOX) injection 40 mg   ferrous sulfate tablet 325 mg   gabapentin (NEURONTIN) capsule 100 mg   magnesium oxide (MAG-OX) tablet 400 mg   memantine (NAMENDA) tablet 5 mg   metoprolol tartrate (LOPRESSOR) tablet 25 mg   mometasone-formoterol (DULERA) 200-5 MCG/ACT inhaler 2 puff   multivitamin with minerals tablet 1 tablet   nitroGLYCERIN (NITROSTAT) SL tablet 0.4 mg   nystatin (MYCOSTATIN) 100000 UNIT/ML suspension 500,000 Units   oxybutynin (DITROPAN) tablet 2.5 mg   pantoprazole (PROTONIX) EC tablet 40 mg   QUEtiapine (SEROQUEL) tablet 50 mg   rosuvastatin (CRESTOR) tablet 40 mg   senna-docusate (Senokot-S) tablet 1 tablet   Outpatient Encounter Medications as of 03/02/2021  Medication Sig   aspirin EC 81 MG tablet Take 81 mg by mouth daily as needed (headache).   budesonide-formoterol (SYMBICORT) 160-4.5 MCG/ACT inhaler TAKE 2 PUFFS BY MOUTH TWICE A DAY (Patient taking differently: Inhale 2 puffs into the lungs in the morning and at bedtime.)   Calcium Citrate-Vitamin D (CALCIUM CITRATE+D3 PETITES PO) Take 1 tablet by mouth daily.   cholecalciferol (VITAMIN D) 25 MCG (1000 UNIT) tablet Take 1,000 Units by mouth daily.   ciclopirox (PENLAC) 8 % solution Apply 1 application topically at bedtime. Apply over nail and surrounding skin. Apply daily over previous coat. After seven (7) days,  file nail and continue cycle.   [employer] MAGNESIUM OXIDE 250 MG TABS TAKE 1 TABLET BY MOUTH WITH EVENING MEAL DAILY (Patient taking differently: Take 250 mg by mouth every evening.)   donepezil (ARICEPT) 10 MG tablet Take 1 tablet (10 mg total) by mouth at bedtime.   ferrous sulfate 325 (65 FE) MG tablet Take 325 mg by mouth daily with breakfast.   furosemide (LASIX) 20 MG  tablet Take 1 tablet by mouth Mon - Fri daily (Patient taking differently: Take 20 mg by mouth See admin instructions. Monday-friday)   gabapentin (NEURONTIN) 100 MG capsule Take 1 capsule (100 mg total) by mouth 2 (two) times daily.   ibuprofen (ADVIL) 200 MG tablet Take 400 mg by mouth every 6 (six) hours as needed (pain).   memantine (NAMENDA) 5 MG tablet Take 1 tablet (5 mg total) by mouth daily.   metoprolol tartrate (LOPRESSOR) 25 MG tablet Take 1 tablet (25 mg total) by mouth 2 (two) times daily.   Multiple Vitamin (MULTIVITAMIN WITH MINERALS) TABS tablet Take 1 tablet by mouth daily.   nitroGLYCERIN (NITROSTAT) 0.4 MG SL tablet PLACE ONE TABLET UNDER THE TONGUE EVERY 5 MINUTES AS NEEDED FOR CHEST PAIN. (Patient taking differently: Place 0.4 mg under the tongue every 5 (five) minutes as needed for chest pain.)   Omega-3 Fatty Acids (FISH OIL PO) Take 1 capsule by mouth daily.   oxybutynin (DITROPAN) 5 MG tablet Take 0.5 tablets (2.5 mg total) by mouth 2 (two) times daily.   pantoprazole (PROTONIX) 40 MG tablet Take 1 tablet (40 mg total) by mouth 2 (two) times daily.   polyethylene glycol (MIRALAX / GLYCOLAX) packet Take 17 g by mouth daily as needed for mild constipation.   Potassium Chloride ER 20 MEQ TBCR Take 2 tablets by mouth every morning. (Patient taking differently: Take 40 mEq by mouth every morning.)   QUEtiapine (SEROQUEL) 50 MG tablet TAKE 1 TABLET BY MOUTH EVERYDAY AT BEDTIME (Patient taking differently: Take 50 mg by mouth at bedtime.)   rosuvastatin (CRESTOR) 40 MG tablet Take 1 tablet (40 mg total) by mouth daily. (Patient taking differently: Take 40 mg by mouth at bedtime.)   traMADol (ULTRAM) 50 MG tablet TAKE 1 TABLET 3 TIMES A DAY AND CAN TAKE 1 EXTRA 20 DAYS/MONTH (Patient taking differently: Take 100 mg by mouth 2 (two) times daily.)    Patient Active Problem List   Diagnosis Date Noted   Gait disturbance 02/27/2021   GERD (gastroesophageal reflux disease) 02/27/2021    Ambulatory dysfunction 02/27/2021   Transaminitis 02/27/2021   Fall (on)(from) incline, initial encounter 11/23/2020   Macrocytosis without anemia 11/22/2020   Unwitnessed fall 11/22/2020   Late onset Alzheimer's dementia with behavioral disturbance ([place]) 11/18/2020   Visual hallucination 11/18/2020   Sundowning 11/18/2020   Non-small cell carcinoma of right lung, stage 1 ([place]) 06/08/2020   Pulmonary nodule 1 cm or greater in diameter 05/21/2020   Atherosclerotic heart disease of native coronary artery with other forms of angina pectoris ([place]) 04/22/2020   Memory loss 02/18/2019   Vitamin B12 deficiency 10/23/2018   Stage 3 chronic kidney disease ([place]) 06/03/2018   Chronic anemia 06/03/2018   Hypercholesterolemia 06/03/2018   Malnutrition of moderate degree 07/12/2017   Hypoalbuminemia 07/11/2017   Elevated LFTs 07/11/2017   Snoring 12/21/2016   Peripheral musculoskeletal gait disorder 02/05/2015   Lumbar post-laminectomy syndrome 02/05/2015   CAD in native artery 10/16/2014   NSTEMI (non-ST elevated myocardial infarction) ([place]) 09/13/2014   Cardiomyopathy, ischemic 09/13/2014  Chronic systolic heart failure (HCC)    Tobacco abuse 09/12/2014   Dyslipidemia 09/12/2014   Normocytic anemia 09/12/2014   Chronic diastolic heart failure, NYHA class 1 ([place]) 09/12/2014   Postlaminectomy syndrome, cervical region 08/01/2013   Chronic midline low back pain with left-sided sciatica 08/01/2013   Shoulder joint contracture 08/01/2013   HTN (hypertension) 03/03/2013   Abnormal genetic test 03/03/2013    Conditions to be addressed/monitored: CHF, HTN, CKD Stage III, and Alzheimer's Disease ; Level of care concerns and Limited access to caregiver  Care Plan : Social Work [place]  Updates made by [name] since 03/02/2021 12:00 AM  Completed 03/02/2021   Problem: Disease Progression Resolved 03/02/2021     Goal: Disease Progression Managed Completed 03/02/2021  Start  Date: 11/12/2020  Priority: High  Note:   Current Barriers:  Chronic disease management support and education needs related to CHF, HTN, CKD Stage III, and Mild Cognitive Impairment   Increased Agitation  Social Worker Clinical Goal(s):  patient will work with SW to identify and address any acute and/or chronic care coordination needs related to the self health management of CHF, HTN, CKD Stage III, and Mild Cognitive Impairment   Patient and her son will work with SW to identify long term care options Patient, with the help of her son, will visit the ED to evaluate cause of increased agitation and combativeness Goal not met  SW Interventions:  Inter-disciplinary care team collaboration (see longitudinal plan of care) Collaboration with [name], FNP regarding development and update of comprehensive plan of care as evidenced by provider attestation and co-signature Performed chart review to note patient remains inpatient at this time with therapy recommendation to discharge to SNF for rehab Case Discussion with [facility] who did speak with the patients son yesterday to review discharge plans (see RN Care Manager note from 10.18.22) Discussed the patients son prefers to remain engaged only with RN Care Manager at this time to discuss patient care needs SW will sign off with option to consult with RN Care Manager as needed in the future - RN Care Manager will remain engaged with patient and her son to address chronic care management needs   Patient Goals/Self-Care Activities patient will: with the help of her son  -  Engage with RN Care Manager to address care management needs  Follow Up Plan:  No follow up planned       Follow Up Plan:  No SW follow up planned at this time due to families request to only engage with RN Care Manager. The patient will remain engaged with [facility] to address care management needs.      [name], BSW,  CDP Social Worker, Certified Dementia Practitioner [place] [facility] [phone]

## 2021-03-02 NOTE — NC FL2 (Signed)
[place] MEDICAID FL2 LEVEL OF CARE SCREENING TOOL     IDENTIFICATION  Patient Name: Sara Fox Birthdate: [birth year] Sex: female Admission Date (Current Location): 02/26/2021  [facility] and [place] Number:  Herbalist and Address:  [facility], [place] [address] [place], [place] [address]      Provider Number: [id number]  Attending Physician Name and Address:  Kayleen Memos, DO  Relative Name and Phone Number:  [name] - son, [phone]    Current Level of Care: Hospital Recommended Level of Care: [place] Prior Approval Number:    Date Approved/Denied:   PASRR Number: [id number]  Discharge Plan: SNF    Current Diagnoses: Patient Active Problem List   Diagnosis Date Noted   Gait disturbance 02/27/2021   GERD (gastroesophageal reflux disease) 02/27/2021   Ambulatory dysfunction 02/27/2021   Transaminitis 02/27/2021   Fall (on)(from) incline, initial encounter 11/23/2020   Macrocytosis without anemia 11/22/2020   Unwitnessed fall 11/22/2020   Late onset Alzheimer's dementia with behavioral disturbance ([place]) 11/18/2020   Visual hallucination 11/18/2020   Sundowning 11/18/2020   Non-small cell carcinoma of right lung, stage 1 ([place]) 06/08/2020   Pulmonary nodule 1 cm or greater in diameter 05/21/2020   Atherosclerotic heart disease of native coronary artery with other forms of angina pectoris ([place]) 04/22/2020   Memory loss 02/18/2019   Vitamin B12 deficiency 10/23/2018   Stage 3 chronic kidney disease ([place]) 06/03/2018   Chronic anemia 06/03/2018   Hypercholesterolemia 06/03/2018   Malnutrition of moderate degree 07/12/2017   Hypoalbuminemia 07/11/2017   Elevated LFTs 07/11/2017   Snoring 12/21/2016   Peripheral musculoskeletal gait disorder 02/05/2015   Lumbar post-laminectomy syndrome 02/05/2015   CAD in native artery 10/16/2014   NSTEMI (non-ST elevated myocardial infarction) ([place]) 09/13/2014    Cardiomyopathy, ischemic 02/54/2706   Chronic systolic heart failure ([place])    Tobacco abuse 09/12/2014   Dyslipidemia 09/12/2014   Normocytic anemia 09/12/2014   Chronic diastolic heart failure, NYHA class 1 ([place]) 09/12/2014   Postlaminectomy syndrome, cervical region 08/01/2013   Chronic midline low back pain with left-sided sciatica 08/01/2013   Shoulder joint contracture 08/01/2013   HTN (hypertension) 03/03/2013   Abnormal genetic test 03/03/2013    Orientation RESPIRATION BLADDER Height & Weight     Self  Normal Incontinent, External catheter Weight: 123 lb (55.8 kg) Height:  5\' 4"  (162.6 cm)  BEHAVIORAL SYMPTOMS/MOOD NEUROLOGICAL BOWEL NUTRITION STATUS      Incontinent Diet (DYS 3)  AMBULATORY STATUS COMMUNICATION OF NEEDS Skin   Extensive Assist (Mod assist - plus 2 physical assistance) Verbally Other (Comment) (Rash bilateral arms)                       Personal Care Assistance Level of Assistance  Bathing, Feeding, Dressing Bathing Assistance: Maximum assistance (Mod assist upper body and max assist lower body) Feeding assistance: Limited assistance (Assistance with set-up) Dressing Assistance: Maximum assistance (Upper body mod assist and lower body max assist)     Functional Limitations Info  Sight, Hearing, Speech Sight Info: Adequate Hearing Info: Adequate Speech Info: Adequate    SPECIAL CARE FACTORS FREQUENCY  PT (By licensed PT), OT (By licensed OT), Speech therapy     PT Frequency: Evaluated 10/17. PT at SNF eval and treat, a minimum of 5 days per week OT Frequency: Evaluated 10/17. OT at SNF eval and treat, a minimum of 5 days per week     Speech  Therapy Frequency: Evaluated 10/17 - DYS 3 diet recommended      Contractures Contractures Info: Not present    Additional Factors Info  Code Status, Allergies Code Status Info: DNR Allergies Info: Buprenorphine Hcl, Morphine And Related, Celebrex (Celecoxib), Vioxx (Rofecoxib), Codeine            Current Medications (03/02/2021):  This is the current hospital active medication list Current Facility-Administered Medications  Medication Dose Route Frequency Provider Last Rate Last Admin    stroke: mapping our early stages of recovery book   Does not apply Once Orma Flaming, MD       acetaminophen (TYLENOL) tablet 650 mg  650 mg Oral Q4H PRN Orma Flaming, MD   650 mg at 03/01/21 2234   Or   acetaminophen (TYLENOL) 160 MG/5ML solution 650 mg  650 mg Per Tube Q4H PRN Orma Flaming, MD       Or   acetaminophen (TYLENOL) suppository 650 mg  650 mg Rectal Q4H PRN Orma Flaming, MD       aspirin EC tablet 81 mg  81 mg Oral Daily Orma Flaming, MD   81 mg at 03/02/21 0849   calcium-vitamin D (OSCAL WITH D) 500-200 MG-UNIT per tablet 1 tablet  1 tablet Oral Daily Orma Flaming, MD   1 tablet at 03/02/21 1045   cholecalciferol (VITAMIN D3) tablet 1,000 Units  1,000 Units Oral Daily Orma Flaming, MD   1,000 Units at 03/02/21 0848   diazepam (VALIUM) tablet 5 mg  5 mg Oral Once Cardama, Grayce Sessions, MD       donepezil (ARICEPT) tablet 10 mg  10 mg Oral QHS Orma Flaming, MD   10 mg at 03/01/21 2233   enoxaparin (LOVENOX) injection 40 mg  40 mg Subcutaneous Q24H Orma Flaming, MD   40 mg at 03/01/21 1714   ferrous sulfate tablet 325 mg  325 mg Oral Q breakfast Orma Flaming, MD   325 mg at 03/02/21 0849   gabapentin (NEURONTIN) capsule 100 mg  100 mg Oral BID Orma Flaming, MD   100 mg at 03/02/21 0849   magnesium oxide (MAG-OX) tablet 400 mg  400 mg Oral QPM Orma Flaming, MD   400 mg at 03/01/21 1714   memantine (NAMENDA) tablet 5 mg  5 mg Oral Daily Orma Flaming, MD   5 mg at 03/02/21 1033   metoprolol tartrate (LOPRESSOR) tablet 25 mg  25 mg Oral BID Orma Flaming, MD   25 mg at 03/02/21 0849   mometasone-formoterol (DULERA) 200-5 MCG/ACT inhaler 2 puff  2 puff Inhalation BID Orma Flaming, MD   2 puff at 03/01/21 2045   multivitamin with minerals tablet 1 tablet  1 tablet  Oral Daily Orma Flaming, MD   1 tablet at 03/02/21 0849   nitroGLYCERIN (NITROSTAT) SL tablet 0.4 mg  0.4 mg Sublingual Q5 min PRN Orma Flaming, MD       nystatin (MYCOSTATIN) 100000 UNIT/ML suspension 500,000 Units  5 mL Oral QID Regalado, Belkys A, MD   500,000 Units at 03/02/21 0849   oxybutynin (DITROPAN) tablet 2.5 mg  2.5 mg Oral BID Orma Flaming, MD   2.5 mg at 03/02/21 0849   pantoprazole (PROTONIX) EC tablet 40 mg  40 mg Oral BID Orma Flaming, MD   40 mg at 03/02/21 0848   QUEtiapine (SEROQUEL) tablet 50 mg  50 mg Oral QHS Orma Flaming, MD   50 mg at 03/01/21 2233   rosuvastatin (CRESTOR) tablet 40 mg  40 mg Oral  Gerri Lins, MD   40 mg at 03/01/21 2234   senna-docusate (Senokot-S) tablet 1 tablet  1 tablet Oral QHS PRN Orma Flaming, MD         Discharge Medications: Please see discharge summary for a list of discharge medications.  Relevant Imaging Results:  Relevant Lab Results:   Additional Information ss# [id number]. COVID vaccinated (09/15/19, 08/30/19)  Sharlet Salina, Mila Homer, LCSW

## 2021-03-02 NOTE — TOC Initial Note (Signed)
Transition of Care [facility]) - Initial/Assessment Note    Patient Details  Name: Sara Fox MRN: [id number] Date of Birth: 1940-11-28  [facility] CM/SW Contact:    Sable Feil, LCSW Phone Number: 03/02/2021, 1:16 PM  Clinical Narrative:  Talked with son, Sara Fox [phone]) by phone regarding the discharge disposition for his mother, and recommendation of ST rehab. When asked, Mr. Soria responded that he and his mother live together and she wears reading glasses. He reported that his mother has had 2 COVID vaccinations. Informed Mr. Vila regarding therapy recommendation of ST rehab. Son reported that he only knows about [facility] as his mother has been there, and he is willing for CSW to send her information out to all the SNF's in [place]. Mr. [name] informed that he will be contacted with facility responses later today.                  Expected Discharge Plan: Skilled Nursing Facility Barriers to Discharge: Other (must enter comment) (Information sent out to facilities and son providing SNF decision)   Patient Goals and CMS Choice Patient states their goals for this hospitalization and ongoing recovery are:: Son agreeable for his mother to discharge to a SNF for ST rehab before returning home CMS Medicare.gov Compare Post Acute Care list provided to:: Patient Represenative (must comment) (son will be informed about Medicare.gov once facility responses are given) Choice offered to / list presented to : Adult Children (Son will be given website information regarding Medicare.gov once facility responses given)  Expected Discharge Plan and Services Expected Discharge Plan: [place] In-house Referral: Clinical Social Work     Living arrangements for the past 2 months: Single Family Home Expected Discharge Date: 03/02/21                                    Prior Living Arrangements/Services Living arrangements for the past 2  months: Single Family Home Lives with:: Adult Children (Patient and son Sara Fox live together) Patient language and need for interpreter reviewed:: No Do you feel safe going back to the place where you live?: No   Son in agreement with SNF for ST rehab  Need for Family Participation in Patient Care: Yes (Comment)     Criminal Activity/Legal Involvement Pertinent to Current Situation/Hospitalization: No - Comment as needed  Activities of Daily Living      Permission Sought/Granted Permission sought to share information with : Other (comment) Permission granted to share information with : No (Talked with son as patient only oriented to self)              Emotional Assessment Appearance:: Other (Comment Required (Did not visit with patient, talked with son by phone) Attitude/Demeanor/Rapport: Unable to Assess (Talked with son by phone) Affect (typically observed): Unable to Assess (Talked with son by phone) Orientation: : Oriented to Self Alcohol / Substance Use: Alcohol Use, Tobacco Use, Illicit Drugs (Patient does not smoke, drink or use illicit drugs) Psych Involvement: No (comment)  Admission diagnosis:  Ataxia [R27.0] Gait disturbance [R26.9] Patient Active Problem List   Diagnosis Date Noted   Gait disturbance 02/27/2021   GERD (gastroesophageal reflux disease) 02/27/2021   Ambulatory dysfunction 02/27/2021   Transaminitis 02/27/2021   Fall (on)(from) incline, initial encounter 11/23/2020   Macrocytosis without anemia 11/22/2020   Unwitnessed fall 11/22/2020   Late onset Alzheimer's dementia with behavioral disturbance ([place])  11/18/2020   Visual hallucination 11/18/2020   Sundowning 11/18/2020   Non-small cell carcinoma of right lung, stage 1 ([place]) 06/08/2020   Pulmonary nodule 1 cm or greater in diameter 05/21/2020   Atherosclerotic heart disease of native coronary artery with other forms of angina pectoris ([place]) 04/22/2020   Memory loss 02/18/2019   Vitamin B12  deficiency 10/23/2018   Stage 3 chronic kidney disease ([place]) 06/03/2018   Chronic anemia 06/03/2018   Hypercholesterolemia 06/03/2018   Malnutrition of moderate degree 07/12/2017   Hypoalbuminemia 07/11/2017   Elevated LFTs 07/11/2017   Snoring 12/21/2016   Peripheral musculoskeletal gait disorder 02/05/2015   Lumbar post-laminectomy syndrome 02/05/2015   CAD in native artery 10/16/2014   NSTEMI (non-ST elevated myocardial infarction) ([place]) 09/13/2014   Cardiomyopathy, ischemic 56/15/3794   Chronic systolic heart failure ([place])    Tobacco abuse 09/12/2014   Dyslipidemia 09/12/2014   Normocytic anemia 09/12/2014   Chronic diastolic heart failure, NYHA class 1 ([place]) 09/12/2014   Postlaminectomy syndrome, cervical region 08/01/2013   Chronic midline low back pain with left-sided sciatica 08/01/2013   Shoulder joint contracture 08/01/2013   HTN (hypertension) 03/03/2013   Abnormal genetic test 03/03/2013   PCP:  [name], FNP Pharmacy:   [id number] - [place], [place] - [place] [address] [place] [address] Phone: [phone] Fax: [phone]  [facility] - [place], [place] - [address] [place] [address] Phone: [phone] Fax: [phone]     Social Determinants of Health (SDOH) Interventions  No SDOH interventions requested or needed at this time. Readmission Risk Interventions No flowsheet data found.

## 2021-03-02 NOTE — Progress Notes (Signed)
PROGRESS NOTE    Sara Fox  LKG:[id number] DOB: Sep 01, 1940 DOA: 02/26/2021 PCP: [name], FNP   Brief Narrative: 80 year old with past medical history significant for Alzheimer's, hypertension, hyperlipidemia, CKD stage III, systolic and diastolic heart failure, CAD, OSA, COPD, B12 deficiency, non-small cell cancer of right lung stage I, tobacco abuse who presents to the ED with gaits  instability/balance issues.   Per family for the last 2 weeks patient has been "rebellious", not cleaning herself, confusion, unable to go to the bathroom by herself.  03/02/2021: No new complaints.  Per her son via phone she was ambulating until she got sick at home.  PT OT recommends SNF.  TOC assisting with SNF placement.   Assessment & Plan:   Principal Problem:   Gait disturbance Active Problems:   HTN (hypertension)   Tobacco abuse   Dyslipidemia   Chronic diastolic heart failure, NYHA class 1 (HCC)   Chronic systolic heart failure (HCC)   Stage 3 chronic kidney disease (HCC)   Chronic anemia   Non-small cell carcinoma of right lung, stage 1 (HCC)   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)   Sundowning   Macrocytosis without anemia   GERD (gastroesophageal reflux disease)   Ambulatory dysfunction   Transaminitis  Gait disturbances/worsening confusion: -MRI brain negative for acute stroke -MRI lumbar  spine: L4-S1 PLIF with unchanged moderate bilateral L5-S1 neural foraminal stenosis. Unchanged mild bilateral L3-4 neural foraminal stenosis. -B12 normal. -Ammonia 27.  -CO2 not significantly elevated.  -UA negative for infection PT OT assessment recommended SNF. TOC assisting with placement. Continue fall precautions  Nonsustained 11 beat V. Tach She denies any anginal symptoms Optimize magnesium and potassium levels.  Elevated liver chemistries, improving LFTs are downtrending.   Continue to monitor. Avoid hepatotoxic agents.  Ambulatory  dysfunction:  PT OT ordered  Chronic back pain:  MRI of lumbar spine unchanged moderate bilateral L5-S1 foraminal stenosis: Unchanged mild bilateral L3-L4 neuroforaminal stenosis: Continue with gabapentin Hold schedule tramadol patient is lethargic  Stage III bCKD: Cr range 1.3 Monitor.   Alzheimer's dementia with behavioral disturbance Continue with Aricept and Namenda  Chronic systolic diastolic heart failure:  Euvolemic on exam.  Continue metoprolol.  She has received low rate IV fluids.    Tobacco abuse:  Continue to encourage tobacco cessation. Bronchodilators as needed.  Dyslipidemia:  Continue with the statins  Sacral ulcer, POA:  Present on admission: Wound care consulted Continue local wound care.  Dysphagia 3 with speech therapist Follow speech therapist recommendations Aspiration precautions.    Estimated body mass index is 21.11 kg/m as calculated from the following:   Height as of this encounter: 5\' 4"  (1.626 m).   Weight as of this encounter: 55.8 kg.   DVT prophylaxis: Lovenox subcu daily. Code Status: DNR Family Communication: Disposition Plan:  Status is: Observation  The patient remains OBS appropriate and will d/c before 2 midnights.       Consultants:  none  Procedures:  None.  Antimicrobials:  None.   Objective: Vitals:   03/02/21 0501 03/02/21 0756 03/02/21 0849 03/02/21 0946  BP: (!) 101/59  112/75 132/70  Pulse: 72 66 68 73  Resp:  16 16 16   Temp: 98.1 F (36.7 C)  97.9 F (36.6 C) (!) 97.4 F (36.3 C)  TempSrc: Oral  Oral Oral  SpO2: 100% 94% 100% 100%  Weight:      Height:        Intake/Output Summary (Last 24 hours) at 03/02/2021 1629  Last data filed at 03/02/2021 0900 Gross per 24 hour  Intake 460 ml  Output 950 ml  Net -490 ml   Filed Weights   02/27/21 0343  Weight: 55.8 kg    Examination: No significant changes from prior exam.  General exam: Well-developed well-nourished in no acute  distress.  She is somnolent but easily arousable to voices.  Minimally interactive.   Respiratory system: Clear to auscultation with no wheezes or rales. Cardiovascular system: Regular rate and rhythm no rubs or gallops.   Gastrointestinal system: Abdomen soft nontender normal bowel sounds.  Nondistended.   Central nervous system: Somnolent but easily arousable to voices.  Extremities: Bilateral trace lower extremity edema. Skin: Left gluteal pressure wound, POA. Psych: Unable to assess mood due to somnolence.   Data Reviewed: I have personally reviewed following labs and imaging studies  CBC: Recent Labs  Lab 02/26/21 1900 02/27/21 0514 03/01/21 0427 03/02/21 0143  WBC 5.8  --  4.4 4.9  NEUTROABS 4.3  --   --   --   HGB 10.6* 10.5* 9.9* 9.5*  HCT 35.9* 31.0* 31.5* 31.4*  MCV 100.6*  --  96.3 98.1  PLT 317  --  295 469   Basic Metabolic Panel: Recent Labs  Lab 02/26/21 1900 02/27/21 0514 03/01/21 0427 03/01/21 1940 03/01/21 2107 03/02/21 0143  NA 141 141 139  --  135  --   K 4.8 4.7 3.5 3.9 4.0  --   CL 105  --  108  --  107  --   CO2 27  --  25  --  23  --   GLUCOSE 139*  --  95  --  104*  --   BUN 37*  --  14  --  14  --   CREATININE 1.20*  --  0.98  --  1.07*  --   CALCIUM 9.3  --  8.8*  --  8.5*  --   MG  --   --  2.0 1.9 2.0  --   PHOS  --   --   --   --   --  3.0   GFR: Estimated Creatinine Clearance: 35.6 mL/min (A) (by C-G formula based on SCr of 1.07 mg/dL (H)). Liver Function Tests: Recent Labs  Lab 02/26/21 1900 03/01/21 2107  AST 43* 25  ALT 54* 25  ALKPHOS 155* 138*  BILITOT 0.5 0.4  PROT 6.4* 5.4*  ALBUMIN 3.1* 2.4*   No results for input(s): LIPASE, AMYLASE in the last 168 hours. Recent Labs  Lab 02/27/21 1026  AMMONIA 27   Coagulation Profile: No results for input(s): INR, PROTIME in the last 168 hours. Cardiac Enzymes: Recent Labs  Lab 02/28/21 1724  CKTOTAL 272*   BNP (last 3 results) No results for input(s): PROBNP in the  last 8760 hours. HbA1C: No results for input(s): HGBA1C in the last 72 hours. CBG: Recent Labs  Lab 02/27/21 1920 02/28/21 1231  GLUCAP 83 101*   Lipid Profile: No results for input(s): CHOL, HDL, LDLCALC, TRIG, CHOLHDL, LDLDIRECT in the last 72 hours. Thyroid Function Tests: No results for input(s): TSH, T4TOTAL, FREET4, T3FREE, THYROIDAB in the last 72 hours. Anemia Panel: Recent Labs    02/28/21 1724  VITAMINB12 252   Sepsis Labs: No results for input(s): PROCALCITON, LATICACIDVEN in the last 168 hours.  Recent Results (from the past 240 hour(s))  Resp Panel by RT-PCR (Flu A&B, Covid) Nasopharyngeal Swab     Status: None   Collection Time:  02/27/21  1:22 PM   Specimen: Nasopharyngeal Swab; Nasopharyngeal(NP) swabs in vial transport medium  Result Value Ref Range Status   SARS Coronavirus 2 by RT PCR NEGATIVE NEGATIVE Final    Comment: (NOTE) SARS-CoV-2 target nucleic acids are NOT DETECTED.  The SARS-CoV-2 RNA is generally detectable in upper respiratory specimens during the acute phase of infection. The lowest concentration of SARS-CoV-2 viral copies this assay can detect is 138 copies/mL. A negative result does not preclude SARS-Cov-2 infection and should not be used as the sole basis for treatment or other patient management decisions. A negative result may occur with  improper specimen collection/handling, submission of specimen other than nasopharyngeal swab, presence of viral mutation(s) within the areas targeted by this assay, and inadequate number of viral copies(<138 copies/mL). A negative result must be combined with clinical observations, patient history, and epidemiological information. The expected result is Negative.  Fact Sheet for Patients:  EntrepreneurPulse.com.au  Fact Sheet for Healthcare Providers:  IncredibleEmployment.be  This test is no t yet approved or cleared by the [place] FDA and  has been  authorized for detection and/or diagnosis of SARS-CoV-2 by FDA under an Emergency Use Authorization (EUA). This EUA will remain  in effect (meaning this test can be used) for the duration of the COVID-19 declaration under Section 564(b)(1) of the Act, 21 U.S.C.section 360bbb-3(b)(1), unless the authorization is terminated  or revoked sooner.       Influenza A by PCR NEGATIVE NEGATIVE Final   Influenza B by PCR NEGATIVE NEGATIVE Final    Comment: (NOTE) The Xpert Xpress SARS-CoV-2/FLU/RSV plus assay is intended as an aid in the diagnosis of influenza from Nasopharyngeal swab specimens and should not be used as a sole basis for treatment. Nasal washings and aspirates are unacceptable for Xpert Xpress SARS-CoV-2/FLU/RSV testing.  Fact Sheet for Patients: EntrepreneurPulse.com.au  Fact Sheet for Healthcare Providers: IncredibleEmployment.be  This test is not yet approved or cleared by the [place] FDA and has been authorized for detection and/or diagnosis of SARS-CoV-2 by FDA under an Emergency Use Authorization (EUA). This EUA will remain in effect (meaning this test can be used) for the duration of the COVID-19 declaration under Section 564(b)(1) of the Act, 21 U.S.C. section 360bbb-3(b)(1), unless the authorization is terminated or revoked.  Performed at [facility], [place] [address], [place], [place] [address]          Radiology Studies: No results found.      Scheduled Meds:   stroke: mapping our early stages of recovery book   Does not apply Once   aspirin EC  81 mg Oral Daily   calcium-vitamin D  1 tablet Oral Daily   cholecalciferol  1,000 Units Oral Daily   diazepam  5 mg Oral Once   donepezil  10 mg Oral QHS   enoxaparin (LOVENOX) injection  40 mg Subcutaneous Q24H   ferrous sulfate  325 mg Oral Q breakfast   gabapentin  100 mg Oral BID   magnesium oxide  400 mg Oral QPM   memantine  5 mg Oral Daily    metoprolol tartrate  25 mg Oral BID   mometasone-formoterol  2 puff Inhalation BID   multivitamin with minerals  1 tablet Oral Daily   nystatin  5 mL Oral QID   oxybutynin  2.5 mg Oral BID   pantoprazole  40 mg Oral BID   QUEtiapine  50 mg Oral QHS   rosuvastatin  40 mg Oral QHS   Continuous  Infusions:     LOS: 0 days    Time spent: 35 minutes    Kayleen Memos, MD [facility]   If 7PM-7AM, please contact night-coverage [email or web address]  03/02/2021, 4:29 PM

## 2021-03-02 NOTE — Progress Notes (Signed)
Day shift RN said, patient got 13 beats of SVT around 7 pm. On call Dr Ninetta Lights notified. EKG done-NSR. Potassium and mag level checked per MD order- normal result. Patient is stable, no new symptoms noted. VS WNL will continue to monitor.

## 2021-03-03 ENCOUNTER — Ambulatory Visit: Payer: Self-pay

## 2021-03-03 ENCOUNTER — Telehealth: Payer: Medicare Other

## 2021-03-03 DIAGNOSIS — I5032 Chronic diastolic (congestive) heart failure: Secondary | ICD-10-CM

## 2021-03-03 DIAGNOSIS — R269 Unspecified abnormalities of gait and mobility: Secondary | ICD-10-CM | POA: Diagnosis not present

## 2021-03-03 DIAGNOSIS — N1831 Chronic kidney disease, stage 3a: Secondary | ICD-10-CM

## 2021-03-03 DIAGNOSIS — I25118 Atherosclerotic heart disease of native coronary artery with other forms of angina pectoris: Secondary | ICD-10-CM

## 2021-03-03 DIAGNOSIS — G3184 Mild cognitive impairment, so stated: Secondary | ICD-10-CM

## 2021-03-03 DIAGNOSIS — I10 Essential (primary) hypertension: Secondary | ICD-10-CM

## 2021-03-03 DIAGNOSIS — C3491 Malignant neoplasm of unspecified part of right bronchus or lung: Secondary | ICD-10-CM

## 2021-03-03 DIAGNOSIS — G301 Alzheimer's disease with late onset: Secondary | ICD-10-CM

## 2021-03-03 NOTE — Progress Notes (Addendum)
Physical Therapy Treatment Patient Details Name: Sara Fox MRN: [id number] DOB: 05/09/1941 Today's Date: 03/03/2021   History of Present Illness 80 y/o female presenting to ED on 10/16 with AMS and gait abnormality. Imaging negative. PMH includes CAD, HTN, tobacco use, dCHF, dementia and CKD.    PT Comments    Pt tolerates treatment well, ambulating for household distances at this time. Pt continues to demonstrate significant impairments in cognition, difficult to determine baseline as PT unable to reach family via phone. Pt's awareness and impaired cognition, along with weakness both increase her risk for falls. Pt will continue to benefit from aggressive mobilization to reduce falls risk. PT continues to recommend SNF placement at this time. Pt could progress to discharge home if family are available to 24/7 supervision and minG for mobility due to cognitive deficits.  Recommendations for follow up therapy are one component of a multi-disciplinary discharge planning process, led by the attending physician.  Recommendations may be updated based on patient status, additional functional criteria and insurance authorization.  Follow Up Recommendations  SNF     Equipment Recommendations  None recommended by PT    Recommendations for Other Services       Precautions / Restrictions Precautions Precautions: Fall Restrictions Weight Bearing Restrictions: No     Mobility  Bed Mobility Overal bed mobility: Needs Assistance Bed Mobility: Supine to Sit     Supine to sit: Supervision;HOB elevated     General bed mobility comments: increased time    Transfers Overall transfer level: Needs assistance Equipment used: 4-wheeled walker Transfers: Sit to/from Stand Sit to Stand: Min guard            Ambulation/Gait Ambulation/Gait assistance: Min guard Gait Distance (Feet): 150 Feet Assistive device: Rolling walker (2 wheeled) Gait Pattern/deviations: Step-through  pattern Gait velocity: reduced Gait velocity interpretation: <1.8 ft/sec, indicate of risk for recurrent falls General Gait Details: pt with slowed step-through gait, no consistent stance width during gait cycle   Stairs             Wheelchair Mobility    Modified Rankin (Stroke Patients Only)       Balance Overall balance assessment: Needs assistance Sitting-balance support: No upper extremity supported;Feet supported Sitting balance-Leahy Scale: Good     Standing balance support: Single extremity supported;Bilateral upper extremity supported Standing balance-Leahy Scale: Poor Standing balance comment: benefits from UE support of RW                            Cognition Arousal/Alertness: Awake/alert Behavior During Therapy: WFL for tasks assessed/performed Overall Cognitive Status: History of cognitive impairments - at baseline                                 General Comments: dementia at baseline, short term memory is impaired. Pt reports living with some friends at home despite living with son      Exercises      General Comments General comments (skin integrity, edema, etc.): VSS on RA      Pertinent Vitals/Pain Pain Assessment: Faces Faces Pain Scale: Hurts a little bit Pain Location: feet Pain Descriptors / Indicators: Sore Pain Intervention(s): Monitored during session    Home Living                      Prior Function  PT Goals (current goals can now be found in the care plan section) Acute Rehab PT Goals Patient Stated Goal: to find ice cream Progress towards PT goals: Progressing toward goals    Frequency    Min 2X/week      PT Plan Current plan remains appropriate    Co-evaluation              AM-PAC PT "6 Clicks" Mobility   Outcome Measure  Help needed turning from your back to your side while in a flat bed without using bedrails?: A Little Help needed moving from lying on  your back to sitting on the side of a flat bed without using bedrails?: A Little Help needed moving to and from a bed to a chair (including a wheelchair)?: A Little Help needed standing up from a chair using your arms (e.g., wheelchair or bedside chair)?: A Little Help needed to walk in hospital room?: A Little Help needed climbing 3-5 steps with a railing? : A Lot 6 Click Score: 17    End of Session   Activity Tolerance: Patient tolerated treatment well Patient left: in chair;with call bell/phone within reach;with chair alarm set Nurse Communication: Mobility status PT Visit Diagnosis: Unsteadiness on feet (R26.81);Muscle weakness (generalized) (M62.81);Other abnormalities of gait and mobility (R26.89)     Time: 8938-1017 PT Time Calculation (min) (ACUTE ONLY): 26 min  Charges:  $Gait Training: 8-22 mins $Therapeutic Activity: 8-22 mins                     Zenaida Niece, PT, DPT Acute Rehabilitation Pager: [phone] Office [place] 03/03/2021, 12:44 PM

## 2021-03-03 NOTE — Progress Notes (Signed)
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   Per family for the last 2 weeks patient has been "rebellious", not cleaning herself, confusion, unable to go to the bathroom by herself.  03/03/2021: She is alert and more interactive.  She has no complaints today.   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 -MRI brain negative for acute stroke -MRI lumbar  spine: L4-S1 PLIF with unchanged moderate bilateral L5-S1 neural foraminal stenosis. Unchanged mild bilateral L3-4 neural foraminal stenosis. -B12 normal. -Ammonia 27.  -CO2 not significantly elevated.  -UA negative for infection PT OT assessment recommended SNF. TOC assisting with placement. Continue fall precautions  Resolved acute metabolic encephalopathy Continue to reorient as needed  Nonsustained 11 beat V. Tach She denies any anginal symptoms Optimize magnesium and potassium levels.  Elevated liver chemistries, improving LFTs are downtrending.   Continue to monitor. Avoid hepatotoxic agents.  Ambulatory  dysfunction:  PT OT recommending SNF TOC assisting with SNF placement.  Chronic back pain:  MRI of lumbar spine unchanged moderate bilateral L5-S1 foraminal stenosis: Unchanged mild bilateral L3-L4 neuroforaminal stenosis: Continue with gabapentin Hold schedule tramadol patient is lethargic  Stage III bCKD: Cr range 1.3 Monitor.   Alzheimer's dementia with behavioral disturbance Continue with Aricept and Namenda  Chronic systolic diastolic heart failure:  Euvolemic on exam.  Continue metoprolol.  She has received low rate IV fluids.    Tobacco abuse:  Continue to encourage tobacco cessation. Bronchodilators as needed.  Dyslipidemia:  Continue with the statins  Sacral ulcer, POA:  Present on admission: Wound care consulted Continue local wound care  Dysphagia 3 with speech therapist Continue aspiration precautions.    Estimated body mass index is 21.11 kg/m as calculated from the following:   Height as of this encounter: 5\' 4"  (1.626 m).   Weight as of this encounter: 55.8 kg.   DVT prophylaxis: Lovenox subcu daily. Code Status: DNR Family Communication: Disposition Plan:  Status is: Observation  The patient remains OBS appropriate and will d/c before 2 midnights.       Consultants:  none  Procedures:  None.  Antimicrobials:  None.   Objective: Vitals:   03/02/21 2101 03/03/21 0452 03/03/21 0907 03/03/21 0958  BP:  108/60  94/60  Pulse:  71  81  Resp:  16 16 16   Temp:  98.4 F (36.9 C)  97.8 F (36.6 C)  TempSrc:      SpO2: 94% 98%  100%  Weight:      Height:        Intake/Output Summary (Last 24 hours) at 03/03/2021 1626 Last data filed at 03/03/2021 1420 Gross  per 24 hour  Intake 840 ml  Output 600 ml  Net 240 ml   Filed Weights   02/27/21 0343  Weight: 55.8 kg    Examination:   General exam: Well-developed well-nourished in no acute distress.  More interactive today.   Respiratory system: Clear to auscultation no wheezes  or rales.  Cardiovascular system: Regular rate and rhythm no rubs or gallops.   Gastrointestinal system: Soft nontender normal bowel sounds present.  Central nervous system: Alert and awake. Extremities: Bilateral trace lower extremity edema. Skin: Left gluteal pressure wound, POA. Psych: Mood is appropriate for condition and setting.   Data Reviewed: I have personally reviewed following labs and imaging studies  CBC: Recent Labs  Lab 02/26/21 1900 02/27/21 0514 03/01/21 0427 03/02/21 0143  WBC 5.8  --  4.4 4.9  NEUTROABS 4.3  --   --   --   HGB 10.6* 10.5* 9.9* 9.5*  HCT 35.9* 31.0* 31.5* 31.4*  MCV 100.6*  --  96.3 98.1  PLT 317  --  295 401   Basic Metabolic Panel: Recent Labs  Lab 02/26/21 1900 02/27/21 0514 03/01/21 0427 03/01/21 1940 03/01/21 2107 03/02/21 0143  NA 141 141 139  --  135  --   K 4.8 4.7 3.5 3.9 4.0  --   CL 105  --  108  --  107  --   CO2 27  --  25  --  23  --   GLUCOSE 139*  --  95  --  104*  --   BUN 37*  --  14  --  14  --   CREATININE 1.20*  --  0.98  --  1.07*  --   CALCIUM 9.3  --  8.8*  --  8.5*  --   MG  --   --  2.0 1.9 2.0  --   PHOS  --   --   --   --   --  3.0   GFR: Estimated Creatinine Clearance: 35.6 mL/min (A) (by C-G formula based on SCr of 1.07 mg/dL (H)). Liver Function Tests: Recent Labs  Lab 02/26/21 1900 03/01/21 2107  AST 43* 25  ALT 54* 25  ALKPHOS 155* 138*  BILITOT 0.5 0.4  PROT 6.4* 5.4*  ALBUMIN 3.1* 2.4*   No results for input(s): LIPASE, AMYLASE in the last 168 hours. Recent Labs  Lab 02/27/21 1026  AMMONIA 27   Coagulation Profile: No results for input(s): INR, PROTIME in the last 168 hours. Cardiac Enzymes: Recent Labs  Lab 02/28/21 1724  CKTOTAL 272*   BNP (last 3 results) No results for input(s): PROBNP in the last 8760 hours. HbA1C: No results for input(s): HGBA1C in the last 72 hours. CBG: Recent Labs  Lab 02/27/21 1920 02/28/21 1231  GLUCAP 83 101*   Lipid Profile: No results  for input(s): CHOL, HDL, LDLCALC, TRIG, CHOLHDL, LDLDIRECT in the last 72 hours. Thyroid Function Tests: No results for input(s): TSH, T4TOTAL, FREET4, T3FREE, THYROIDAB in the last 72 hours. Anemia Panel: Recent Labs    02/28/21 1724  VITAMINB12 252   Sepsis Labs: No results for input(s): PROCALCITON, LATICACIDVEN in the last 168 hours.  Recent Results (from the past 240 hour(s))  Resp Panel by RT-PCR (Flu A&B, Covid) Nasopharyngeal Swab     Status: None   Collection Time: 02/27/21  1:22 PM   Specimen: Nasopharyngeal Swab; Nasopharyngeal(NP) swabs in vial transport medium  Result Value Ref Range Status   SARS Coronavirus 2 by  RT PCR NEGATIVE NEGATIVE Final    Comment: (NOTE) SARS-CoV-2 target nucleic acids are NOT DETECTED.  The SARS-CoV-2 RNA is generally detectable in upper respiratory specimens during the acute phase of infection. The lowest concentration of SARS-CoV-2 viral copies this assay can detect is 138 copies/mL. A negative result does not preclude SARS-Cov-2 infection and should not be used as the sole basis for treatment or other patient management decisions. A negative result may occur with  improper specimen collection/handling, submission of specimen other than nasopharyngeal swab, presence of viral mutation(s) within the areas targeted by this assay, and inadequate number of viral copies(<138 copies/mL). A negative result must be combined with clinical observations, patient history, and epidemiological information. The expected result is Negative.  Fact Sheet for Patients:  EntrepreneurPulse.com.au  Fact Sheet for Healthcare Providers:  IncredibleEmployment.be  This test is no t yet approved or cleared by the [place] FDA and  has been authorized for detection and/or diagnosis of SARS-CoV-2 by FDA under an Emergency Use Authorization (EUA). This EUA will remain  in effect (meaning this test can be used) for the  duration of the COVID-19 declaration under Section 564(b)(1) of the Act, 21 U.S.C.section 360bbb-3(b)(1), unless the authorization is terminated  or revoked sooner.       Influenza A by PCR NEGATIVE NEGATIVE Final   Influenza B by PCR NEGATIVE NEGATIVE Final    Comment: (NOTE) The Xpert Xpress SARS-CoV-2/FLU/RSV plus assay is intended as an aid in the diagnosis of influenza from Nasopharyngeal swab specimens and should not be used as a sole basis for treatment. Nasal washings and aspirates are unacceptable for Xpert Xpress SARS-CoV-2/FLU/RSV testing.  Fact Sheet for Patients: EntrepreneurPulse.com.au  Fact Sheet for Healthcare Providers: IncredibleEmployment.be  This test is not yet approved or cleared by the [place] FDA and has been authorized for detection and/or diagnosis of SARS-CoV-2 by FDA under an Emergency Use Authorization (EUA). This EUA will remain in effect (meaning this test can be used) for the duration of the COVID-19 declaration under Section 564(b)(1) of the Act, 21 U.S.C. section 360bbb-3(b)(1), unless the authorization is terminated or revoked.  Performed at [facility], [place] [address], [place], [place] [address]          Radiology Studies: No results found.      Scheduled Meds:   stroke: mapping our early stages of recovery book   Does not apply Once   aspirin EC  81 mg Oral Daily   calcium-vitamin D  1 tablet Oral Daily   cholecalciferol  1,000 Units Oral Daily   diazepam  5 mg Oral Once   donepezil  10 mg Oral QHS   enoxaparin (LOVENOX) injection  40 mg Subcutaneous Q24H   ferrous sulfate  325 mg Oral Q breakfast   gabapentin  100 mg Oral BID   magnesium oxide  400 mg Oral QPM   memantine  5 mg Oral Daily   metoprolol tartrate  25 mg Oral BID   mometasone-formoterol  2 puff Inhalation BID   multivitamin with minerals  1 tablet Oral Daily   nystatin  5 mL Oral QID   oxybutynin  2.5 mg  Oral BID   pantoprazole  40 mg Oral BID   QUEtiapine  50 mg Oral QHS   rosuvastatin  40 mg Oral QHS   Continuous Infusions:     LOS: 0 days    Time spent: 35 minutes    Kayleen Memos, MD [facility]   If  7PM-7AM, please contact night-coverage [email or web address]  03/03/2021, 4:26 PM

## 2021-03-03 NOTE — Chronic Care Management (AMB) (Signed)
Chronic Care Management   CCM RN Visit Note  03/03/2021 Name: Sara Fox MRN: [id number] DOB: May 24, 1940  Subjective: Sara Fox is a 80 y.o. year old female who is a primary care patient of [name], [place]. The care management team was consulted for assistance with disease management and care coordination needs.    Collaboration with [facility]  for  Case Collaboration   in response to provider referral for case management and/or care coordination services.   Consent to Services:  The patient was given information about Chronic Care Management services, agreed to services, and gave verbal consent prior to initiation of services.  Please see initial visit note for detailed documentation.   Patient agreed to services and verbal consent obtained.   Assessment: Review of patient past medical history, allergies, medications, health status, including review of consultants reports, laboratory and other test data, was performed as part of comprehensive evaluation and provision of chronic care management services.   SDOH (Social Determinants of Health) assessments and interventions performed:    CCM Care Plan  Allergies  Allergen Reactions   Buprenorphine Hcl Shortness Of Breath    Throat swelling/trouble breathing and lethargic   Morphine And Related Shortness Of Breath    Throat swelling/trouble breathing and lethargic   Celebrex [Celecoxib] Diarrhea and Swelling    Swelling of legs    Vioxx [Rofecoxib] Palpitations   Codeine Other (See Comments)    Bloated     Facility-Administered Encounter Medications as of 03/03/2021  Medication    stroke: mapping our early stages of recovery book   acetaminophen (TYLENOL) tablet 650 mg   Or   acetaminophen (TYLENOL) 160 MG/5ML solution 650 mg   Or   acetaminophen (TYLENOL) suppository 650 mg   aspirin EC tablet 81 mg   calcium-vitamin D (OSCAL WITH D) 500-200 MG-UNIT per tablet 1 tablet   cholecalciferol (VITAMIN D3) tablet  1,000 Units   diazepam (VALIUM) tablet 5 mg   donepezil (ARICEPT) tablet 10 mg   enoxaparin (LOVENOX) injection 40 mg   ferrous sulfate tablet 325 mg   gabapentin (NEURONTIN) capsule 100 mg   magnesium oxide (MAG-OX) tablet 400 mg   memantine (NAMENDA) tablet 5 mg   metoprolol tartrate (LOPRESSOR) tablet 25 mg   mometasone-formoterol (DULERA) 200-5 MCG/ACT inhaler 2 puff   multivitamin with minerals tablet 1 tablet   nitroGLYCERIN (NITROSTAT) SL tablet 0.4 mg   nystatin (MYCOSTATIN) 100000 UNIT/ML suspension 500,000 Units   oxybutynin (DITROPAN) tablet 2.5 mg   pantoprazole (PROTONIX) EC tablet 40 mg   QUEtiapine (SEROQUEL) tablet 50 mg   rosuvastatin (CRESTOR) tablet 40 mg   senna-docusate (Senokot-S) tablet 1 tablet   Outpatient Encounter Medications as of 03/03/2021  Medication Sig   aspirin EC 81 MG tablet Take 81 mg by mouth daily as needed (headache).   budesonide-formoterol (SYMBICORT) 160-4.5 MCG/ACT inhaler TAKE 2 PUFFS BY MOUTH TWICE A DAY (Patient taking differently: Inhale 2 puffs into the lungs in the morning and at bedtime.)   Calcium Citrate-Vitamin D (CALCIUM CITRATE+D3 PETITES PO) Take 1 tablet by mouth daily.   cholecalciferol (VITAMIN D) 25 MCG (1000 UNIT) tablet Take 1,000 Units by mouth daily.   ciclopirox (PENLAC) 8 % solution Apply 1 application topically at bedtime. Apply over nail and surrounding skin. Apply daily over previous coat. After seven (7) days, file nail and continue cycle.   [employer] MAGNESIUM OXIDE 250 MG TABS TAKE 1 TABLET BY MOUTH WITH EVENING MEAL DAILY (Patient taking differently: Take 250  mg by mouth every evening.)   donepezil (ARICEPT) 10 MG tablet Take 1 tablet (10 mg total) by mouth at bedtime.   ferrous sulfate 325 (65 FE) MG tablet Take 325 mg by mouth daily with breakfast.   furosemide (LASIX) 20 MG tablet Take 1 tablet by mouth Mon - Fri daily (Patient taking differently: Take 20 mg by mouth See admin instructions. Monday-friday)    gabapentin (NEURONTIN) 100 MG capsule Take 1 capsule (100 mg total) by mouth 2 (two) times daily.   ibuprofen (ADVIL) 200 MG tablet Take 400 mg by mouth every 6 (six) hours as needed (pain).   memantine (NAMENDA) 5 MG tablet Take 1 tablet (5 mg total) by mouth daily.   metoprolol tartrate (LOPRESSOR) 25 MG tablet Take 1 tablet (25 mg total) by mouth 2 (two) times daily.   Multiple Vitamin (MULTIVITAMIN WITH MINERALS) TABS tablet Take 1 tablet by mouth daily.   nitroGLYCERIN (NITROSTAT) 0.4 MG SL tablet PLACE ONE TABLET UNDER THE TONGUE EVERY 5 MINUTES AS NEEDED FOR CHEST PAIN. (Patient taking differently: Place 0.4 mg under the tongue every 5 (five) minutes as needed for chest pain.)   Omega-3 Fatty Acids (FISH OIL PO) Take 1 capsule by mouth daily.   oxybutynin (DITROPAN) 5 MG tablet Take 0.5 tablets (2.5 mg total) by mouth 2 (two) times daily.   pantoprazole (PROTONIX) 40 MG tablet Take 1 tablet (40 mg total) by mouth 2 (two) times daily.   polyethylene glycol (MIRALAX / GLYCOLAX) packet Take 17 g by mouth daily as needed for mild constipation.   Potassium Chloride ER 20 MEQ TBCR Take 2 tablets by mouth every morning. (Patient taking differently: Take 40 mEq by mouth every morning.)   QUEtiapine (SEROQUEL) 50 MG tablet TAKE 1 TABLET BY MOUTH EVERYDAY AT BEDTIME (Patient taking differently: Take 50 mg by mouth at bedtime.)   rosuvastatin (CRESTOR) 40 MG tablet Take 1 tablet (40 mg total) by mouth daily. (Patient taking differently: Take 40 mg by mouth at bedtime.)   traMADol (ULTRAM) 50 MG tablet TAKE 1 TABLET 3 TIMES A DAY AND CAN TAKE 1 EXTRA 20 DAYS/MONTH (Patient taking differently: Take 100 mg by mouth 2 (two) times daily.)    Patient Active Problem List   Diagnosis Date Noted   Gait disturbance 02/27/2021   GERD (gastroesophageal reflux disease) 02/27/2021   Ambulatory dysfunction 02/27/2021   Transaminitis 02/27/2021   Fall (on)(from) incline, initial encounter 11/23/2020    Macrocytosis without anemia 11/22/2020   Unwitnessed fall 11/22/2020   Late onset Alzheimer's dementia with behavioral disturbance ([place]) 11/18/2020   Visual hallucination 11/18/2020   Sundowning 11/18/2020   Non-small cell carcinoma of right lung, stage 1 ([place]) 06/08/2020   Pulmonary nodule 1 cm or greater in diameter 05/21/2020   Atherosclerotic heart disease of native coronary artery with other forms of angina pectoris ([place]) 04/22/2020   Memory loss 02/18/2019   Vitamin B12 deficiency 10/23/2018   Stage 3 chronic kidney disease ([place]) 06/03/2018   Chronic anemia 06/03/2018   Hypercholesterolemia 06/03/2018   Malnutrition of moderate degree 07/12/2017   Hypoalbuminemia 07/11/2017   Elevated LFTs 07/11/2017   Snoring 12/21/2016   Peripheral musculoskeletal gait disorder 02/05/2015   Lumbar post-laminectomy syndrome 02/05/2015   CAD in native artery 10/16/2014   NSTEMI (non-ST elevated myocardial infarction) ([place]) 09/13/2014   Cardiomyopathy, ischemic 35/70/1779   Chronic systolic heart failure ([place])    Tobacco abuse 09/12/2014   Dyslipidemia 09/12/2014   Normocytic anemia 09/12/2014   Chronic diastolic heart  failure, NYHA class 1 ([place]) 09/12/2014   Postlaminectomy syndrome, cervical region 08/01/2013   Chronic midline low back pain with left-sided sciatica 08/01/2013   Shoulder joint contracture 08/01/2013   HTN (hypertension) 03/03/2013   Abnormal genetic test 03/03/2013    Conditions to be addressed/monitored: Stage 3a chronic kidney disease, HTN, chronic systolic heart failure, Mild Cognitive Impairment, Atherosclerotic heart disease of native coronary artery with other forms of angina pectoris, Lung Nodule, 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    Care Plan : Wellness (Adult)  Updates made by Lynne Logan, RN since 03/03/2021 12:00 AM     Problem: Medication Adherence (Wellness)   Priority: High     Goal: Medication Adherence and Medical  Management of Chronic Heatlh Conditions Maintained   Start Date: 12/24/2020  Expected End Date: 04/13/2021  Recent Progress: On track  Priority: High  Note:   Current Barriers:  Ineffective Self Health Maintenance in a patient with Stage 3a chronic kidney disease, HTN, chronic systolic heart failure, Mild Cognitive Impairment, Atherosclerotic heart disease of native coronary artery with other forms of angina pectoris, Lung Nodule   Clinical Goal(s):  Collaboration with [name], FNP regarding development and update of comprehensive plan of care as evidenced by provider attestation and co-signature Inter-disciplinary care team collaboration (see longitudinal plan of care) patient will work with care management team to address care coordination and chronic disease management needs related to Disease Management Educational Needs Care Coordination Medication Management and Education Psychosocial Support Dementia and Caregiver Support   Interventions:  03/01/21 completed successful outbound call with son [name] Determined patient was admitted to [facility] on 02/26/21 for dx: Ataxia Per patient's son, patient was negative for UTI and negative for Stroke Discussed patient will be discharged to inpatient rehab once ready for discharge Educated son regarding advanced stages of Dementia including difficulty with balance and coordination resulting in increased falls, decreased mobility and the inability to perform self care  Discussed [name] will plan to have his mother discharged home following rehab at which time he will continue to locate a provider who will take over patient's medical management and make house calls  Discussed plans with patient for ongoing care management follow up and provided patient with direct contact information for care management team 03/03/21 Case Collaboration  Spoke with Jenny Reichmann from [place] [phone] advising this provider has requested an  exception of benefits from [facility] in order to provide services to Ms. Vasey Discussed it may take 14 days for a final decision to be reached  Updated Jenny Reichmann regarding patients current IP stay with plans to discharge to SNF Advised patient's son [name] continues to plan to bring his mother home folowing rehab and would like [facility] to take over her medical management if approved Discussed [facility] provided son [name] with the contact name/number for Dr. Daphene Jaeger with [facility] as an alternative if [facility] is not approved by health plan, [facility] is also contacted with this group Discussed Jenny Reichmann will keep this RN CM informed of the outcome of the benefit exception requested Sent secure message to PCP and embedded BSW with an update concerning patient status and [facility] efforts to provide services for patient upon her discharge home if approved by [place] Activities:  With assistance from son [name], take all medications as prescribed Contact PCP and or embedded Pharm D for questions regarding medications or medication refills Ensure refills are on the way when medication supply is getting low  Notify PCP if unable to take medications as prescribed  Patient Goals: - pending hospital discharge   Follow Up Plan: The care management team will reach out to the patient/son again upon patient's discharge home        Follow Up Plan: The care management team will reach out to the patient/son again upon patient's discharge home     Barb Merino, RN, BSN, CCM Care Management Coordinator [place] [facility]  Direct Phone: [phone]

## 2021-03-03 NOTE — Progress Notes (Signed)
Pt. Was seen for skilled OT to maximize I and safety with ADLs and mobility. Pt. Was cooperative during session and had improved preformance with ADLs and mobility. Acute OT to follow.  03/03/21 1200  OT Visit Information  Last OT Received On 03/03/21  Assistance Needed +1  History of Present Illness 80 y/o female presenting to ED on 10/16 with AMS and gait abnormality. Imaging negative. PMH includes CAD, HTN, tobacco use, dCHF, dementia and CKD.  Precautions  Precautions Fall  Pain Assessment  Pain Assessment 0-10  Pain Score 4  Pain Location b feet and legs  Pain Descriptors / Indicators Sore  Pain Intervention(s) Monitored during session;Patient requesting pain meds-RN notified  Cognition  Arousal/Alertness Awake/alert  Behavior During Therapy WFL for tasks assessed/performed  Overall Cognitive Status History of cognitive impairments - at baseline  General Comments dementia at baseline, short term memory is impaired. Pt reports living with some friends at home despite living with son  Upper Extremity Assessment  Upper Extremity Assessment Generalized weakness  Lower Extremity Assessment  Lower Extremity Assessment Defer to PT evaluation  ADL  Overall ADL's  Needs assistance/impaired  Eating/Feeding Set up;Sitting  Grooming Wash/dry face;Wash/dry hands;Set up;Sitting  Upper Body Bathing Minimal assistance;Sitting  Lower Body Bathing Moderate assistance;Sit to/from stand  Upper Body Dressing  Moderate assistance;Sitting  Lower Body Dressing Moderate assistance;Sit to/from stand  Toilet Transfer Minimal assistance;Stand-pivot;BSC  Toileting- Clothing Manipulation and Hygiene Moderate assistance;Sit to/from stand  Functional mobility during ADLs Minimal assistance;Rolling walker  General ADL Comments Pt. prefomred adls sitting in recliner  Balance  Sitting balance-Leahy Scale Good  Standing balance-Leahy Scale Poor  Restrictions  Weight Bearing Restrictions No  Vision-  Assessment  Vision Assessment? No apparent visual deficits  Transfers  Overall transfer level Needs assistance  Equipment used Rolling walker (2 wheeled)  Transfers Sit to/from Stand  Sit to Stand Min assist  OT - End of Session  Equipment Utilized During Treatment Gait belt;Rolling walker  Activity Tolerance Patient tolerated treatment well  Patient left in chair;with call bell/phone within reach;with chair alarm set  Nurse Communication Patient requests pain meds  OT Assessment/Plan  OT Plan Discharge plan remains appropriate  OT Visit Diagnosis Unsteadiness on feet (R26.81);Other abnormalities of gait and mobility (R26.89);Muscle weakness (generalized) (M62.81)  OT Frequency (ACUTE ONLY) Min 2X/week  Follow Up Recommendations SNF  AM-PAC OT "6 Clicks" Daily Activity Outcome Measure (Version 2)  Help from another person eating meals? 3  Help from another person taking care of personal grooming? 3  Help from another person toileting, which includes using toliet, bedpan, or urinal? 2  Help from another person bathing (including washing, rinsing, drying)? 2  Help from another person to put on and taking off regular upper body clothing? 2  Help from another person to put on and taking off regular lower body clothing? 2  6 Click Score 14  Progressive Mobility  What is the highest level of mobility based on the progressive mobility assessment? Level 4 (Walks with assist in room) - Balance while marching in place and cannot step forward and back - Complete  Mobility Ambulated with assistance in room  OT Goal Progression  Progress towards OT goals Progressing toward goals  Acute Rehab OT Goals  Patient Stated Goal to go home  OT Goal Formulation With patient  Time For Goal Achievement 03/14/21  Potential to Achieve Goals Good  ADL Goals  Pt Will Perform Grooming with set-up;with supervision;sitting  Pt Will Perform Lower Body Dressing with  min assist;sit to/from stand  Pt Will  Transfer to Toilet ambulating;bedside commode;with min guard assist  Pt Will Perform Toileting - Clothing Manipulation and hygiene with min guard assist;sit to/from stand;sitting/lateral leans  Additional ADL Goal #1 Pt will perform bed mobility with Min Guard A in preparation for ADLs  OT Time Calculation  OT Start Time (ACUTE ONLY) 1239  OT Stop Time (ACUTE ONLY) 1304  OT Time Calculation (min) 25 min  OT General Charges  $OT Visit 1 Visit  OT Treatments  $Self Care/Home Management  23-37 mins  Reece Packer OT/L

## 2021-03-04 DIAGNOSIS — R269 Unspecified abnormalities of gait and mobility: Secondary | ICD-10-CM | POA: Diagnosis not present

## 2021-03-04 LAB — RESP PANEL BY RT-PCR (FLU A&B, COVID) ARPGX2
Influenza A by PCR: NEGATIVE
Influenza B by PCR: NEGATIVE
SARS Coronavirus 2 by RT PCR: NEGATIVE

## 2021-03-04 NOTE — Progress Notes (Signed)
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   Per family for the last 2 weeks patient has been "rebellious", not cleaning herself, confusion, unable to go to the bathroom by herself.  03/04/2021: Seen at her bedside.  She had no new complaints.  TOC assisting with SNF placement.    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 -MRI brain negative for acute stroke -MRI lumbar  spine: L4-S1 PLIF with unchanged moderate bilateral L5-S1 neural foraminal stenosis. Unchanged mild bilateral L3-4 neural foraminal stenosis. -B12 normal. -Ammonia 27.  -CO2 not significantly elevated.  -UA negative for infection PT OT assessment recommended SNF. TOC assisting with placement. Continue fall precautions  Resolved acute metabolic encephalopathy Continue to reorient as needed  Nonsustained 11 beat V. Tach She denies any anginal symptoms Optimize magnesium and potassium levels.  Elevated liver chemistries, improving LFTs are downtrending.   Continue to monitor. Avoid hepatotoxic  agents.  Ambulatory dysfunction:  PT OT recommending SNF TOC assisting with SNF placement.  Chronic back pain:  MRI of lumbar spine unchanged moderate bilateral L5-S1 foraminal stenosis: Unchanged mild bilateral L3-L4 neuroforaminal stenosis: Continue with gabapentin Hold schedule tramadol patient is lethargic  Stage III bCKD: Cr range 1.3 Monitor.   Alzheimer's dementia with behavioral disturbance Continue with Aricept and Namenda  Chronic systolic diastolic heart failure:  Euvolemic on exam.  Continue metoprolol.  She has received low rate IV fluids.    Tobacco abuse:  Continue to encourage tobacco cessation. Bronchodilators as needed.  Dyslipidemia:  Continue with the statins  Sacral ulcer, POA:  Present on admission: Wound care consulted Continue local wound care  Dysphagia 3 with speech therapist Continue aspiration precautions.    Estimated body mass index is 23.8 kg/m as calculated from the following:   Height as of this encounter: 5\' 4"  (1.626 m).   Weight as of this encounter: 62.9 kg.   DVT prophylaxis: Lovenox subcu daily. Code Status: DNR Family Communication: Disposition Plan:  Status is: Observation  The patient remains OBS appropriate and will d/c before 2 midnights.       Consultants:  none  Procedures:  None.  Antimicrobials:  None.   Objective: Vitals:   03/03/21 1705 03/03/21 2204 03/04/21 0441 03/04/21 1220  BP: 101/62 (!) 95/58 (!) 104/52 (!) 104/55  Pulse: 78 86 79 87  Resp: 18 18 18 20   Temp: 98 F (36.7 C) 98.1 F (36.7 C) 98 F (36.7 C) 98.5 F (36.9 C)  TempSrc: Oral Oral Oral Oral  SpO2: 99% 97% 100% 100%  Weight:  62.9 kg    Height:  Intake/Output Summary (Last 24 hours) at 03/04/2021 1618 Last data filed at 03/04/2021 1545 Gross per 24 hour  Intake 360 ml  Output 1700 ml  Net -1340 ml   Filed Weights   02/27/21 0343 03/03/21 2204  Weight: 55.8 kg 62.9 kg    Examination: No significant  change from prior exam.  General exam: Well-developed well-nourished in no acute distress.  More interactive today.   Respiratory system: Clear to auscultation no wheezes or rales.  Cardiovascular system: Regular rate and rhythm no rubs or gallops.   Gastrointestinal system: Soft nontender normal bowel sounds present.  Central nervous system: Alert and awake. Extremities: Bilateral trace lower extremity edema. Skin: Left gluteal pressure wound, POA. Psych: Mood is appropriate for condition and setting.   Data Reviewed: I have personally reviewed following labs and imaging studies  CBC: Recent Labs  Lab 02/26/21 1900 02/27/21 0514 03/01/21 0427 03/02/21 0143  WBC 5.8  --  4.4 4.9  NEUTROABS 4.3  --   --   --   HGB 10.6* 10.5* 9.9* 9.5*  HCT 35.9* 31.0* 31.5* 31.4*  MCV 100.6*  --  96.3 98.1  PLT 317  --  295 321   Basic Metabolic Panel: Recent Labs  Lab 02/26/21 1900 02/27/21 0514 03/01/21 0427 03/01/21 1940 03/01/21 2107 03/02/21 0143  NA 141 141 139  --  135  --   K 4.8 4.7 3.5 3.9 4.0  --   CL 105  --  108  --  107  --   CO2 27  --  25  --  23  --   GLUCOSE 139*  --  95  --  104*  --   BUN 37*  --  14  --  14  --   CREATININE 1.20*  --  0.98  --  1.07*  --   CALCIUM 9.3  --  8.8*  --  8.5*  --   MG  --   --  2.0 1.9 2.0  --   PHOS  --   --   --   --   --  3.0   GFR: Estimated Creatinine Clearance: 35.6 mL/min (A) (by C-G formula based on SCr of 1.07 mg/dL (H)). Liver Function Tests: Recent Labs  Lab 02/26/21 1900 03/01/21 2107  AST 43* 25  ALT 54* 25  ALKPHOS 155* 138*  BILITOT 0.5 0.4  PROT 6.4* 5.4*  ALBUMIN 3.1* 2.4*   No results for input(s): LIPASE, AMYLASE in the last 168 hours. Recent Labs  Lab 02/27/21 1026  AMMONIA 27   Coagulation Profile: No results for input(s): INR, PROTIME in the last 168 hours. Cardiac Enzymes: Recent Labs  Lab 02/28/21 1724  CKTOTAL 272*   BNP (last 3 results) No results for input(s): PROBNP in the last 8760  hours. HbA1C: No results for input(s): HGBA1C in the last 72 hours. CBG: Recent Labs  Lab 02/27/21 1920 02/28/21 1231  GLUCAP 83 101*   Lipid Profile: No results for input(s): CHOL, HDL, LDLCALC, TRIG, CHOLHDL, LDLDIRECT in the last 72 hours. Thyroid Function Tests: No results for input(s): TSH, T4TOTAL, FREET4, T3FREE, THYROIDAB in the last 72 hours. Anemia Panel: No results for input(s): VITAMINB12, FOLATE, FERRITIN, TIBC, IRON, RETICCTPCT in the last 72 hours.  Sepsis Labs: No results for input(s): PROCALCITON, LATICACIDVEN in the last 168 hours.  Recent Results (from the past 240 hour(s))  Resp Panel by RT-PCR (Flu A&B, Covid) Nasopharyngeal Swab     Status: None   Collection  Time: 02/27/21  1:22 PM   Specimen: Nasopharyngeal Swab; Nasopharyngeal(NP) swabs in vial transport medium  Result Value Ref Range Status   SARS Coronavirus 2 by RT PCR NEGATIVE NEGATIVE Final    Comment: (NOTE) SARS-CoV-2 target nucleic acids are NOT DETECTED.  The SARS-CoV-2 RNA is generally detectable in upper respiratory specimens during the acute phase of infection. The lowest concentration of SARS-CoV-2 viral copies this assay can detect is 138 copies/mL. A negative result does not preclude SARS-Cov-2 infection and should not be used as the sole basis for treatment or other patient management decisions. A negative result may occur with  improper specimen collection/handling, submission of specimen other than nasopharyngeal swab, presence of viral mutation(s) within the areas targeted by this assay, and inadequate number of viral copies(<138 copies/mL). A negative result must be combined with clinical observations, patient history, and epidemiological information. The expected result is Negative.  Fact Sheet for Patients:  EntrepreneurPulse.com.au  Fact Sheet for Healthcare Providers:  IncredibleEmployment.be  This test is no t yet approved or cleared by  the [place] FDA and  has been authorized for detection and/or diagnosis of SARS-CoV-2 by FDA under an Emergency Use Authorization (EUA). This EUA will remain  in effect (meaning this test can be used) for the duration of the COVID-19 declaration under Section 564(b)(1) of the Act, 21 U.S.C.section 360bbb-3(b)(1), unless the authorization is terminated  or revoked sooner.       Influenza A by PCR NEGATIVE NEGATIVE Final   Influenza B by PCR NEGATIVE NEGATIVE Final    Comment: (NOTE) The Xpert Xpress SARS-CoV-2/FLU/RSV plus assay is intended as an aid in the diagnosis of influenza from Nasopharyngeal swab specimens and should not be used as a sole basis for treatment. Nasal washings and aspirates are unacceptable for Xpert Xpress SARS-CoV-2/FLU/RSV testing.  Fact Sheet for Patients: EntrepreneurPulse.com.au  Fact Sheet for Healthcare Providers: IncredibleEmployment.be  This test is not yet approved or cleared by the [place] FDA and has been authorized for detection and/or diagnosis of SARS-CoV-2 by FDA under an Emergency Use Authorization (EUA). This EUA will remain in effect (meaning this test can be used) for the duration of the COVID-19 declaration under Section 564(b)(1) of the Act, 21 U.S.C. section 360bbb-3(b)(1), unless the authorization is terminated or revoked.  Performed at [facility], [place] [address] [place],  [address]          Radiology Studies: No results found.      Scheduled Meds:   stroke: mapping our early stages of recovery book   Does not apply Once   aspirin EC  81 mg Oral Daily   calcium-vitamin D  1 tablet Oral Daily   cholecalciferol  1,000 Units Oral Daily   diazepam  5 mg Oral Once   donepezil  10 mg Oral QHS   enoxaparin (LOVENOX) injection  40 mg Subcutaneous Q24H   ferrous sulfate  325 mg Oral Q breakfast   gabapentin  100 mg Oral BID   magnesium oxide  400 mg Oral QPM    memantine  5 mg Oral Daily   metoprolol tartrate  25 mg Oral BID   mometasone-formoterol  2 puff Inhalation BID   multivitamin with minerals  1 tablet Oral Daily   nystatin  5 mL Oral QID   oxybutynin  2.5 mg Oral BID   pantoprazole  40 mg Oral BID   QUEtiapine  50 mg Oral QHS   rosuvastatin  40 mg Oral QHS  Continuous Infusions:     LOS: 0 days    Time spent: 35 minutes    Kayleen Memos, MD [facility]   If 7PM-7AM, please contact night-coverage [email or web address]  03/04/2021, 4:18 PM

## 2021-03-04 NOTE — Plan of Care (Signed)

## 2021-03-04 NOTE — Consult Note (Signed)
[facility] CM Inpatient Consult   03/04/2021  Sara Fox 20-Jan-1941 [id number]  Follow up:  5 days obs  Spoke with inpatient LCSW, Erie Noe,  regarding patient had been referred to APS prior to admission, Edsel Petrin SW with APS noted per Embedded social worker's notes 02/17/21.  Patient is being recommended for SNF for post hospital rehab.  Reviewed Embedded [facility] RN notes for post hospital plans if patient returns to community.  Patient is currently for a skilled nursing facility level of care for rehab noted.  Plan:  Continue to follow til disposition is completed.  Charlesetta Shanks, RN BSN CCM [facility]  [phone] business mobile phone Toll free office [phone]  Fax number: [phone] [email or web address]     Charlesetta Shanks, RN BSN CCM [facility]  [phone] business mobile phone Toll free office [phone]  Fax number: [phone] [email or web address]

## 2021-03-04 NOTE — TOC Progression Note (Addendum)
Transition of Care [facility]) - Progression Note    Patient Details  Name: Sara Fox MRN: [id number] Date of Birth: 1940/08/07  [facility]) CM/SW Contact  Sharlet Salina Mila Homer, LCSW Phone Number: 03/04/2021, 4:58 PM  Clinical Narrative:  Talked with [place], case manager regarding patient and [facility] [employer] (APS) involvement. Checked facility responses and [place] and [place] made bed offers. Talked with son [name] [phone]) regarding SNF placement and facility responses provided. He requested that CSW go to patient's room to get his mother's input. CSW visited room with son still on the phone and Ms. Rudden was informed of the nursing facilities that accepted her for rehab, however she did not state a preference. [name] then told his mother that he chose [place]. Contacted Grace at [employer] (2:52 pm) and informed her that [facility] chosen for patient's [place] rehab and was informed that they could take her.    4:08 pm: Shirlee Limerick contacted and advised that authorization received and patient can discharge to [facility] Saturday, 12/22. Provided auth information: Candace Cruise ID: [id number], effective 10/22 - 10/25 and next review date 10/25.   4:13 pm: [name] contacted and informed that insurance auth received and his mother will discharge to [place] on 10/22. MD contacted and informed.   Talked with patient nurse regarding COVID test.     Expected Discharge Plan: Skilled Nursing Facility Barriers to Discharge: Other (must enter comment) (Information sent out to facilities and son providing SNF decision)  Expected Discharge Plan and Services Expected Discharge Plan: [place] In-house Referral: Clinical Social Work     Living arrangements for the past 2 months: Single Family Home Expected Discharge Date: 03/04/21                                     Social Determinants of  Health (SDOH) Interventions  No SDOH interventions requested or needed at this time.  Readmission Risk Interventions No flowsheet data found.

## 2021-03-05 DIAGNOSIS — E7849 Other hyperlipidemia: Secondary | ICD-10-CM | POA: Diagnosis not present

## 2021-03-05 DIAGNOSIS — R531 Weakness: Secondary | ICD-10-CM | POA: Diagnosis not present

## 2021-03-05 DIAGNOSIS — G301 Alzheimer's disease with late onset: Secondary | ICD-10-CM | POA: Diagnosis not present

## 2021-03-05 DIAGNOSIS — I25118 Atherosclerotic heart disease of native coronary artery with other forms of angina pectoris: Secondary | ICD-10-CM | POA: Diagnosis not present

## 2021-03-05 DIAGNOSIS — Z7401 Bed confinement status: Secondary | ICD-10-CM | POA: Diagnosis not present

## 2021-03-05 DIAGNOSIS — Z7982 Long term (current) use of aspirin: Secondary | ICD-10-CM | POA: Diagnosis not present

## 2021-03-05 DIAGNOSIS — M6281 Muscle weakness (generalized): Secondary | ICD-10-CM | POA: Diagnosis not present

## 2021-03-05 DIAGNOSIS — C3491 Malignant neoplasm of unspecified part of right bronchus or lung: Secondary | ICD-10-CM | POA: Diagnosis not present

## 2021-03-05 DIAGNOSIS — J449 Chronic obstructive pulmonary disease, unspecified: Secondary | ICD-10-CM | POA: Diagnosis not present

## 2021-03-05 DIAGNOSIS — M170 Bilateral primary osteoarthritis of knee: Secondary | ICD-10-CM | POA: Diagnosis not present

## 2021-03-05 DIAGNOSIS — I5042 Chronic combined systolic (congestive) and diastolic (congestive) heart failure: Secondary | ICD-10-CM | POA: Diagnosis not present

## 2021-03-05 DIAGNOSIS — I5022 Chronic systolic (congestive) heart failure: Secondary | ICD-10-CM | POA: Diagnosis not present

## 2021-03-05 DIAGNOSIS — K219 Gastro-esophageal reflux disease without esophagitis: Secondary | ICD-10-CM | POA: Diagnosis not present

## 2021-03-05 DIAGNOSIS — Z85118 Personal history of other malignant neoplasm of bronchus and lung: Secondary | ICD-10-CM | POA: Diagnosis not present

## 2021-03-05 DIAGNOSIS — Z79899 Other long term (current) drug therapy: Secondary | ICD-10-CM | POA: Diagnosis not present

## 2021-03-05 DIAGNOSIS — M25561 Pain in right knee: Secondary | ICD-10-CM | POA: Diagnosis not present

## 2021-03-05 DIAGNOSIS — M2550 Pain in unspecified joint: Secondary | ICD-10-CM | POA: Diagnosis not present

## 2021-03-05 DIAGNOSIS — I2510 Atherosclerotic heart disease of native coronary artery without angina pectoris: Secondary | ICD-10-CM | POA: Diagnosis not present

## 2021-03-05 DIAGNOSIS — I5032 Chronic diastolic (congestive) heart failure: Secondary | ICD-10-CM | POA: Diagnosis not present

## 2021-03-05 DIAGNOSIS — E785 Hyperlipidemia, unspecified: Secondary | ICD-10-CM | POA: Diagnosis not present

## 2021-03-05 DIAGNOSIS — R451 Restlessness and agitation: Secondary | ICD-10-CM | POA: Diagnosis not present

## 2021-03-05 DIAGNOSIS — Z20822 Contact with and (suspected) exposure to covid-19: Secondary | ICD-10-CM | POA: Diagnosis not present

## 2021-03-05 DIAGNOSIS — M25562 Pain in left knee: Secondary | ICD-10-CM | POA: Diagnosis not present

## 2021-03-05 DIAGNOSIS — F02818 Dementia in other diseases classified elsewhere, unspecified severity, with other behavioral disturbance: Secondary | ICD-10-CM | POA: Diagnosis not present

## 2021-03-05 DIAGNOSIS — R269 Unspecified abnormalities of gait and mobility: Secondary | ICD-10-CM | POA: Diagnosis not present

## 2021-03-05 DIAGNOSIS — F17210 Nicotine dependence, cigarettes, uncomplicated: Secondary | ICD-10-CM | POA: Diagnosis not present

## 2021-03-05 DIAGNOSIS — M549 Dorsalgia, unspecified: Secondary | ICD-10-CM | POA: Diagnosis not present

## 2021-03-05 DIAGNOSIS — N1831 Chronic kidney disease, stage 3a: Secondary | ICD-10-CM | POA: Diagnosis not present

## 2021-03-05 DIAGNOSIS — D631 Anemia in chronic kidney disease: Secondary | ICD-10-CM | POA: Diagnosis not present

## 2021-03-05 DIAGNOSIS — Z23 Encounter for immunization: Secondary | ICD-10-CM | POA: Diagnosis not present

## 2021-03-05 DIAGNOSIS — I130 Hypertensive heart and chronic kidney disease with heart failure and stage 1 through stage 4 chronic kidney disease, or unspecified chronic kidney disease: Secondary | ICD-10-CM | POA: Diagnosis not present

## 2021-03-05 DIAGNOSIS — I10 Essential (primary) hypertension: Secondary | ICD-10-CM | POA: Diagnosis not present

## 2021-03-05 DIAGNOSIS — D508 Other iron deficiency anemias: Secondary | ICD-10-CM | POA: Diagnosis not present

## 2021-03-05 DIAGNOSIS — R2689 Other abnormalities of gait and mobility: Secondary | ICD-10-CM | POA: Diagnosis not present

## 2021-03-05 DIAGNOSIS — N183 Chronic kidney disease, stage 3 unspecified: Secondary | ICD-10-CM | POA: Diagnosis not present

## 2021-03-05 DIAGNOSIS — R5381 Other malaise: Secondary | ICD-10-CM | POA: Diagnosis not present

## 2021-03-05 NOTE — Plan of Care (Signed)
  Problem: Education: Goal: Knowledge of General Education information will improve Description: Including pain rating scale, medication(s)/side effects and non-pharmacologic comfort measures Outcome: Progressing   Problem: Nutrition: Goal: Adequate nutrition will be maintained Outcome: Progressing   

## 2021-03-05 NOTE — TOC Transition Note (Signed)
Transition of Care [facility]) - CM/SW Discharge Note   Patient Details  Name: Sara Fox MRN: [id number] Date of Birth: 28-Jan-1941  [facility] CM/SW Contact:  Coralee Pesa, [place] Phone Number: 03/05/2021, 11:15 AM   Clinical Narrative:    Pt to be transported to [facility] via [place]. Nurse to call report to [phone]. Rm# [id number]   Final next level of care: Skilled Nursing Facility Barriers to Discharge: Barriers Resolved   Patient Goals and CMS Choice Patient states their goals for this hospitalization and ongoing recovery are:: Son agreeable for his mother to discharge to a SNF for ST rehab before returning home CMS Medicare.gov Compare Post Acute Care list provided to:: Patient Represenative (must comment) (son will be informed about Medicare.gov once facility responses are given) Choice offered to / list presented to : Adult Children (Son will be given website information regarding Medicare.gov once facility responses given)  Discharge Placement              Patient chooses bed at:  [facility]) Patient to be transferred to facility by: [place] Name of family member notified: [name] Patient and family notified of of transfer: 03/05/21  Discharge Plan and Services In-house Referral: Clinical Social Work                                   Social Determinants of Health ([place]) Interventions     Readmission Risk Interventions No flowsheet data found.

## 2021-03-05 NOTE — Progress Notes (Signed)
DISCHARGE NOTE HOME ARDATH [facility]PAK to be discharged Skilled nursing facility per MD order. Discussed prescriptions and follow up appointments with the patient. Prescriptions given to patient; medication list explained in detail. Patient verbalized understanding.  Skin clean, dry and intact without evidence of skin break down, no evidence of skin tears noted. IV catheter discontinued intact. Site without signs and symptoms of complications. Dressing and pressure applied. Pt denies pain at the site currently. No complaints noted.  Patient free of lines, drains, and wounds.   An After Visit Summary (AVS) was printed and given to the patient. Patient escorted via stretcher, and discharged home via [place]. Vira Agar, RN

## 2021-03-05 NOTE — Discharge Summary (Signed)
Discharge Summary  Sara Fox JJH:[id number] DOB: 1941/04/23  PCP: [name], FNP  Admit date: 02/26/2021 Discharge date: 03/05/2021  Time spent: 35 minutes.  Recommendations for Outpatient Follow-up:  Follow-up with your PCP.  Continue PT OT with assistance and fall precautions.  Discharge Diagnoses:  Active Hospital Problems   Diagnosis Date Noted   Gait disturbance 02/27/2021   GERD (gastroesophageal reflux disease) 02/27/2021   Ambulatory dysfunction 02/27/2021   Transaminitis 02/27/2021   Macrocytosis without anemia 11/22/2020   Late onset Alzheimer's dementia with behavioral disturbance ([facility]) 11/18/2020   Sundowning 11/18/2020   Non-small cell carcinoma of right lung, stage 1 ([place]) 06/08/2020   Stage 3 chronic kidney disease ([place]) 06/03/2018   Chronic anemia 81/85/6314   Chronic systolic heart failure ([place])    Chronic diastolic heart failure, NYHA class 1 ([place]) 09/12/2014   Tobacco abuse 09/12/2014   Dyslipidemia 09/12/2014   HTN (hypertension) 03/03/2013    Resolved Hospital Problems  No resolved problems to display.    Discharge Condition: Stable  Diet recommendation: Resume previous diet.  Vitals:   03/04/21 1648 03/05/21 0538  BP: 92/81 (!) 117/55  Pulse: 79 74  Resp: 18 18  Temp: 98.9 F (37.2 C) 97.6 F (36.4 C)  SpO2: 100% 100%    History of present illness:   80 year old with past medical history significant for Alzheimer's dementia, hypertension, hyperlipidemia, CKD stage III, systolic and diastolic heart failure, CAD, OSA, COPD, B12 deficiency, non-small cell cancer of right lung stage I, tobacco abuse who presents to the ED with gaits  instability/balance issues.  Per family for the last 2 weeks patient has been "rebellious", not cleaning herself, confusion, unable to go to the bathroom by herself.  Work-up revealed progression of Alzheimer's dementia.  She was seen by PT OT with recommendation for SNF.   03/05/2021: No acute events  overnight.  No new complaints.  Hospital Course:  Principal Problem:   Gait disturbance Active Problems:   HTN (hypertension)   Tobacco abuse   Dyslipidemia   Chronic diastolic heart failure, NYHA class 1 ([facility])   Chronic systolic heart failure ([facility])   Stage 3 chronic kidney disease ([facility])   Chronic anemia   Non-small cell carcinoma of right lung, stage 1 ([facility])   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)   Sundowning   Macrocytosis without anemia   GERD (gastroesophageal reflux disease)   Ambulatory dysfunction   Transaminitis  Gait disturbances/acute metabolic encephalopathy Confusion is resolved.  She is back to her baseline mentation. -MRI brain negative for acute stroke -MRI lumbar  spine: L4-S1 PLIF with unchanged moderate bilateral L5-S1 neural foraminal stenosis. Unchanged mild bilateral L3-4 neural foraminal stenosis. -B12 normal. -Ammonia normal.  -Last TSH normal 0.918 on 11/22/2020. -UA negative for infection -Chest x-ray no acute abnormality noted. PT OT assessment recommended SNF. TOC assisting with placement. Continue fall precautions   Nonsustained 11 beat V. Tach She denies any anginal symptoms Optimize magnesium and potassium levels.   Resolving elevated liver chemistries Alkaline phosphatase downtrending 155 to 138. AST and ALT back to normal. T bilirubin normal. Avoid hepatotoxic agents.   Ambulatory dysfunction:  PT OT recommending SNF TOC assisting with SNF placement.   Chronic back pain:  MRI of lumbar spine unchanged moderate bilateral L5-S1 foraminal stenosis: Unchanged mild bilateral L3-L4 neuroforaminal stenosis: Continue with gabapentin Avoid narcotics.   Stage IIIACKD: Currently at baseline, creatinine 1.07 with GFR 52.   Alzheimer's dementia with behavioral disturbance Continue with Aricept and Namenda  Reorient as needed.   Chronic systolic diastolic heart failure:  Euvolemic on exam.  Continue metoprolol.     Tobacco abuse:  Continue to encourage tobacco cessation. Bronchodilators as needed.   Dyslipidemia:  Continue with home Crestor.   Sacral ulcer, POA:  Seen by wound care specialist. Continue local wound care   Dysphagia 3 with speech therapist Continue aspiration precautions.     Code Status: DNR      Consultants:  none   Procedures:  None.   Antimicrobials:  None.     Discharge Exam: BP (!) 117/55 (BP Location: Left Arm)   Pulse 74   Temp 97.6 F (36.4 C) (Oral)   Resp 18   Ht 5\' 4"  (1.626 m)   Wt 62.9 kg   SpO2 100%   BMI 23.80 kg/m  General: 80 y.o. year-old female well developed well nourished in no acute distress.   Cardiovascular: Regular rate and rhythm with no rubs or gallops.  No thyromegaly or JVD noted.   Respiratory: Clear to auscultation with no wheezes or rales. Good inspiratory effort. Abdomen: Soft nontender nondistended with normal bowel sounds x4 quadrants. Musculoskeletal: No lower extremity edema. 2/4 pulses in all 4 extremities. Psychiatry: Mood is appropriate for condition and setting  Discharge Instructions You were cared for by a hospitalist during your hospital stay. If you have any questions about your discharge medications or the care you received while you were in the hospital after you are discharged, you can call the unit and asked to speak with the hospitalist on call if the hospitalist that took care of you is not available. Once you are discharged, your primary care physician will handle any further medical issues. Please note that NO REFILLS for any discharge medications will be authorized once you are discharged, as it is imperative that you return to your primary care physician (or establish a relationship with a primary care physician if you do not have one) for your aftercare needs so that they can reassess your need for medications and monitor your lab values.   Allergies as of 03/05/2021       Reactions    Buprenorphine Hcl Shortness Of Breath   Throat swelling/trouble breathing and lethargic   Morphine And Related Shortness Of Breath   Throat swelling/trouble breathing and lethargic   Celebrex [celecoxib] Diarrhea, Swelling   Swelling of legs    Vioxx [rofecoxib] Palpitations   Codeine Other (See Comments)   Bloated         Medication List     STOP taking these medications    ibuprofen 200 MG tablet Commonly known as: ADVIL       TAKE these medications    aspirin EC 81 MG tablet Take 81 mg by mouth daily as needed (headache).   budesonide-formoterol 160-4.5 MCG/ACT inhaler Commonly known as: Symbicort TAKE 2 PUFFS BY MOUTH TWICE A DAY What changed:  how much to take how to take this when to take this additional instructions   CALCIUM CITRATE+D3 PETITES PO Take 1 tablet by mouth daily.   cholecalciferol 25 MCG (1000 UNIT) tablet Commonly known as: VITAMIN D Take 1,000 Units by mouth daily.   ciclopirox 8 % solution Commonly known as: Penlac Apply 1 application topically at bedtime. Apply over nail and surrounding skin. Apply daily over previous coat. After seven (7) days, file nail and continue cycle.   [employer] Magnesium Oxide 250 MG Tabs Generic drug: Magnesium Oxide -Mg Supplement TAKE 1 TABLET BY MOUTH WITH EVENING  MEAL DAILY What changed: See the new instructions.   donepezil 10 MG tablet Commonly known as: ARICEPT Take 1 tablet (10 mg total) by mouth at bedtime.   ferrous sulfate 325 (65 FE) MG tablet Take 325 mg by mouth daily with breakfast.   FISH OIL PO Take 1 capsule by mouth daily.   furosemide 20 MG tablet Commonly known as: LASIX Take 1 tablet by mouth Mon - Fri daily What changed:  how much to take how to take this when to take this additional instructions   gabapentin 100 MG capsule Commonly known as: NEURONTIN Take 1 capsule (100 mg total) by mouth 2 (two) times daily.   memantine 5 MG tablet Commonly known as: NAMENDA Take 1  tablet (5 mg total) by mouth daily.   metoprolol tartrate 25 MG tablet Commonly known as: LOPRESSOR Take 1 tablet (25 mg total) by mouth 2 (two) times daily.   multivitamin with minerals Tabs tablet Take 1 tablet by mouth daily.   nitroGLYCERIN 0.4 MG SL tablet Commonly known as: NITROSTAT PLACE ONE TABLET UNDER THE TONGUE EVERY 5 MINUTES AS NEEDED FOR CHEST PAIN. What changed:  how much to take how to take this when to take this reasons to take this additional instructions   oxybutynin 5 MG tablet Commonly known as: DITROPAN Take 0.5 tablets (2.5 mg total) by mouth 2 (two) times daily.   pantoprazole 40 MG tablet Commonly known as: PROTONIX Take 1 tablet (40 mg total) by mouth 2 (two) times daily.   polyethylene glycol 17 g packet Commonly known as: MIRALAX / GLYCOLAX Take 17 g by mouth daily as needed for mild constipation.   Potassium Chloride ER 20 MEQ Tbcr Take 2 tablets by mouth every morning. What changed: how much to take   QUEtiapine 50 MG tablet Commonly known as: SEROQUEL TAKE 1 TABLET BY MOUTH EVERYDAY AT BEDTIME What changed:  how much to take how to take this when to take this additional instructions   rosuvastatin 40 MG tablet Commonly known as: CRESTOR Take 1 tablet (40 mg total) by mouth daily. What changed: when to take this   traMADol 50 MG tablet Commonly known as: ULTRAM TAKE 1 TABLET 3 TIMES A DAY AND CAN TAKE 1 EXTRA 20 DAYS/MONTH What changed:  how much to take how to take this when to take this additional instructions               Durable Medical Equipment  (From admission, onward)           Start     Ordered   03/02/21 1117  For home use only DME standard manual wheelchair with seat cushion  Once       Comments: Patient suffers from ambulatory dysfunction which impairs their ability to perform daily activities like walking in the home.  A walker will not resolve issue with performing activities of daily living. A  wheelchair will allow patient to safely perform daily activities. Patient can safely propel the wheelchair in the home or has a caregiver who can provide assistance. Length of need 6 months. Accessories: elevating leg rests, wheel locks, extensions and anti-tippers.   03/02/21 1117           Allergies  Allergen Reactions   Buprenorphine Hcl Shortness Of Breath    Throat swelling/trouble breathing and lethargic   Morphine And Related Shortness Of Breath    Throat swelling/trouble breathing and lethargic   Celebrex [Celecoxib] Diarrhea and Swelling  Swelling of legs    Vioxx [Rofecoxib] Palpitations   Codeine Other (See Comments)    Bloated     Follow-up Information     [name], FNP. Call today.   Specialty: General Practice Why: please call for a post hospital follow up appointment Contact information: [address] [place] [address] [phone]         Pixie Casino, MD .   Specialty: Cardiology Contact information: [place] [address] [phone]                  The results of significant diagnostics from this hospitalization (including imaging, microbiology, ancillary and laboratory) are listed below for reference.    Significant Diagnostic Studies: DG Chest 2 View  Result Date: 02/26/2021 CLINICAL DATA:  Altered mental status and weakness, initial encounter EXAM: CHEST - 2 VIEW COMPARISON:  11/22/2020 FINDINGS: Cardiac shadow is enlarged but stable. Aortic calcifications are noted. Lungs are well aerated bilaterally. Persistent opacity is noted in the right apex consistent with the patient's known history of non-small cell lung carcinoma stable in appearance from the prior exam. No new focal infiltrate is seen. No bony abnormality is noted. Postsurgical changes in lumbar spine are seen. IMPRESSION: Persistent right apical density consistent with the given clinical history of non-small cell  carcinoma. No acute abnormality noted. Electronically Signed   By: Inez Catalina M.D.   On: 02/26/2021 19:51   DG Abdomen 1 View  Result Date: 02/27/2021 CLINICAL DATA:  MRI screening EXAM: ABDOMEN - 1 VIEW COMPARISON:  None. FINDINGS: Bowel gas pattern is unremarkable. Sutures are present in the left upper quadrant. Surgical clips overlie the epigastric region, right upper quadrant, and right pelvis. Postoperative changes of the lumbosacral spine. IMPRESSION: No acute abnormality. No radiopaque foreign body that precludes MRI. Electronically Signed   By: Macy Mis M.D.   On: 02/27/2021 12:55   CT Head Wo Contrast  Result Date: 02/26/2021 CLINICAL DATA:  Generalized weakness, headache, altered mental status EXAM: CT HEAD WITHOUT CONTRAST TECHNIQUE: Contiguous axial images were obtained from the base of the skull through the vertex without intravenous contrast. COMPARISON:  11/22/2020 FINDINGS: Brain: No evidence of acute infarction, hemorrhage, hydrocephalus, extra-axial collection or mass lesion/mass effect. Scattered low-density changes within the periventricular and subcortical white matter compatible with chronic microvascular ischemic change. Mild diffuse cerebral volume loss. Vascular: Atherosclerotic calcifications involving the large vessels of the skull base. No unexpected hyperdense vessel. Skull: Normal. Negative for fracture or focal lesion. Sinuses/Orbits: No acute finding. Other: None. IMPRESSION: 1. No acute intracranial findings. 2. Mild chronic microvascular ischemic change and cerebral volume loss. Electronically Signed   By: Davina Poke D.O.   On: 02/26/2021 19:43   MR BRAIN WO CONTRAST  Result Date: 02/27/2021 CLINICAL DATA:  Acute neurologic deficit EXAM: MRI HEAD WITHOUT CONTRAST TECHNIQUE: Multiplanar, multiecho pulse sequences of the brain and surrounding structures were obtained without intravenous contrast. COMPARISON:  06/21/2020 FINDINGS: Brain: No acute infarct,  mass effect or extra-axial collection. No acute or chronic hemorrhage. There is multifocal hyperintense T2-weighted signal within the white matter. Generalized volume loss without a clear lobar predilection. The midline structures are normal. Vascular: Major flow voids are preserved. Skull and upper cervical spine: Normal calvarium and skull base. Visualized upper cervical spine and soft tissues are normal. Sinuses/Orbits:No paranasal sinus fluid levels or advanced mucosal thickening. No mastoid or middle ear effusion. Normal orbits. IMPRESSION: 1. No acute intracranial abnormality. 2. Findings of  chronic small vessel ischemia and volume loss. Electronically Signed   By: Ulyses Jarred M.D.   On: 02/27/2021 19:21   MR LUMBAR SPINE WO CONTRAST  Result Date: 02/27/2021 CLINICAL DATA:  Ataxia EXAM: MRI LUMBAR SPINE WITHOUT CONTRAST TECHNIQUE: Multiplanar, multisequence MR imaging of the lumbar spine was performed. No intravenous contrast was administered. COMPARISON:  03/26/2010 FINDINGS: Segmentation:  Standard. Alignment:  Grade 1 anterolisthesis at L4-5 and L5-S1 Vertebrae:  L4-S1 PLIF.  No acute abnormality. Conus medullaris and cauda equina: Conus extends to the L2 level. Conus and cauda equina appear normal. Paraspinal and other soft tissues: Negative Disc levels: L1-L2: Normal disc space and facet joints. No spinal canal stenosis. No neural foraminal stenosis. L2-L3: Normal disc space and facet joints. No spinal canal stenosis. No neural foraminal stenosis. L3-L4: Small disc bulge. No spinal canal stenosis. Mild bilateral neural foraminal stenosis. L4-L5: Grade 1 anterolisthesis. PLIF. No spinal canal stenosis. No neural foraminal stenosis. L5-S1: Grade 1 anterolisthesis. PLIF. No spinal canal stenosis. Unchanged moderate bilateral neural foraminal stenosis. Visualized sacrum: Normal. IMPRESSION: 1. L4-S1 PLIF with unchanged moderate bilateral L5-S1 neural foraminal stenosis. 2. Unchanged mild bilateral L3-4  neural foraminal stenosis. Electronically Signed   By: Ulyses Jarred M.D.   On: 02/27/2021 19:27    Microbiology: Recent Results (from the past 240 hour(s))  Resp Panel by RT-PCR (Flu A&B, Covid) Nasopharyngeal Swab     Status: None   Collection Time: 02/27/21  1:22 PM   Specimen: Nasopharyngeal Swab; Nasopharyngeal(NP) swabs in vial transport medium  Result Value Ref Range Status   SARS Coronavirus 2 by RT PCR NEGATIVE NEGATIVE Final    Comment: (NOTE) SARS-CoV-2 target nucleic acids are NOT DETECTED.  The SARS-CoV-2 RNA is generally detectable in upper respiratory specimens during the acute phase of infection. The lowest concentration of SARS-CoV-2 viral copies this assay can detect is 138 copies/mL. A negative result does not preclude SARS-Cov-2 infection and should not be used as the sole basis for treatment or other patient management decisions. A negative result may occur with  improper specimen collection/handling, submission of specimen other than nasopharyngeal swab, presence of viral mutation(s) within the areas targeted by this assay, and inadequate number of viral copies(<138 copies/mL). A negative result must be combined with clinical observations, patient history, and epidemiological information. The expected result is Negative.  Fact Sheet for Patients:  EntrepreneurPulse.com.au  Fact Sheet for Healthcare Providers:  IncredibleEmployment.be  This test is no t yet approved or cleared by the [place] FDA and  has been authorized for detection and/or diagnosis of SARS-CoV-2 by FDA under an Emergency Use Authorization (EUA). This EUA will remain  in effect (meaning this test can be used) for the duration of the COVID-19 declaration under Section 564(b)(1) of the Act, 21 U.S.C.section 360bbb-3(b)(1), unless the authorization is terminated  or revoked sooner.       Influenza A by PCR NEGATIVE NEGATIVE Final   Influenza B by  PCR NEGATIVE NEGATIVE Final    Comment: (NOTE) The Xpert Xpress SARS-CoV-2/FLU/RSV plus assay is intended as an aid in the diagnosis of influenza from Nasopharyngeal swab specimens and should not be used as a sole basis for treatment. Nasal washings and aspirates are unacceptable for Xpert Xpress SARS-CoV-2/FLU/RSV testing.  Fact Sheet for Patients: EntrepreneurPulse.com.au  Fact Sheet for Healthcare Providers: IncredibleEmployment.be  This test is not yet approved or cleared by the [place] FDA and has been authorized for detection and/or diagnosis of SARS-CoV-2 by FDA under an Emergency Use  Authorization (EUA). This EUA will remain in effect (meaning this test can be used) for the duration of the COVID-19 declaration under Section 564(b)(1) of the Act, 21 U.S.C. section 360bbb-3(b)(1), unless the authorization is terminated or revoked.  Performed at [facility], [place] [address], [place], [place] [address]   Resp Panel by RT-PCR (Flu A&B, Covid) Nasopharyngeal Swab     Status: None   Collection Time: 03/04/21  6:08 PM   Specimen: Nasopharyngeal Swab; Nasopharyngeal(NP) swabs in vial transport medium  Result Value Ref Range Status   SARS Coronavirus 2 by RT PCR NEGATIVE NEGATIVE Final    Comment: (NOTE) SARS-CoV-2 target nucleic acids are NOT DETECTED.  The SARS-CoV-2 RNA is generally detectable in upper respiratory specimens during the acute phase of infection. The lowest concentration of SARS-CoV-2 viral copies this assay can detect is 138 copies/mL. A negative result does not preclude SARS-Cov-2 infection and should not be used as the sole basis for treatment or other patient management decisions. A negative result may occur with  improper specimen collection/handling, submission of specimen other than nasopharyngeal swab, presence of viral mutation(s) within the areas targeted by this assay, and inadequate number of  viral copies(<138 copies/mL). A negative result must be combined with clinical observations, patient history, and epidemiological information. The expected result is Negative.  Fact Sheet for Patients:  EntrepreneurPulse.com.au  Fact Sheet for Healthcare Providers:  IncredibleEmployment.be  This test is no t yet approved or cleared by the [place] FDA and  has been authorized for detection and/or diagnosis of SARS-CoV-2 by FDA under an Emergency Use Authorization (EUA). This EUA will remain  in effect (meaning this test can be used) for the duration of the COVID-19 declaration under Section 564(b)(1) of the Act, 21 U.S.C.section 360bbb-3(b)(1), unless the authorization is terminated  or revoked sooner.       Influenza A by PCR NEGATIVE NEGATIVE Final   Influenza B by PCR NEGATIVE NEGATIVE Final    Comment: (NOTE) The Xpert Xpress SARS-CoV-2/FLU/RSV plus assay is intended as an aid in the diagnosis of influenza from Nasopharyngeal swab specimens and should not be used as a sole basis for treatment. Nasal washings and aspirates are unacceptable for Xpert Xpress SARS-CoV-2/FLU/RSV testing.  Fact Sheet for Patients: EntrepreneurPulse.com.au  Fact Sheet for Healthcare Providers: IncredibleEmployment.be  This test is not yet approved or cleared by the [place] FDA and has been authorized for detection and/or diagnosis of SARS-CoV-2 by FDA under an Emergency Use Authorization (EUA). This EUA will remain in effect (meaning this test can be used) for the duration of the COVID-19 declaration under Section 564(b)(1) of the Act, 21 U.S.C. section 360bbb-3(b)(1), unless the authorization is terminated or revoked.  Performed at [facility], [place] [address], [place],  [address]      Labs: Basic Metabolic Panel: Recent Labs  Lab 02/26/21 1900 02/27/21 0514 03/01/21 0427 03/01/21 1940  03/01/21 2107 03/02/21 0143  NA 141 141 139  --  135  --   K 4.8 4.7 3.5 3.9 4.0  --   CL 105  --  108  --  107  --   CO2 27  --  25  --  23  --   GLUCOSE 139*  --  95  --  104*  --   BUN 37*  --  14  --  14  --   CREATININE 1.20*  --  0.98  --  1.07*  --   CALCIUM 9.3  --  8.8*  --  8.5*  --   MG  --   --  2.0 1.9 2.0  --   PHOS  --   --   --   --   --  3.0   Liver Function Tests: Recent Labs  Lab 02/26/21 1900 03/01/21 2107  AST 43* 25  ALT 54* 25  ALKPHOS 155* 138*  BILITOT 0.5 0.4  PROT 6.4* 5.4*  ALBUMIN 3.1* 2.4*   No results for input(s): LIPASE, AMYLASE in the last 168 hours. Recent Labs  Lab 02/27/21 1026  AMMONIA 27   CBC: Recent Labs  Lab 02/26/21 1900 02/27/21 0514 03/01/21 0427 03/02/21 0143  WBC 5.8  --  4.4 4.9  NEUTROABS 4.3  --   --   --   HGB 10.6* 10.5* 9.9* 9.5*  HCT 35.9* 31.0* 31.5* 31.4*  MCV 100.6*  --  96.3 98.1  PLT 317  --  295 282   Cardiac Enzymes: Recent Labs  Lab 02/28/21 1724  CKTOTAL 272*   BNP: BNP (last 3 results) Recent Labs    11/22/20 1354 02/27/21 0504  BNP 51.3 63.7    ProBNP (last 3 results) No results for input(s): PROBNP in the last 8760 hours.  CBG: Recent Labs  Lab 02/27/21 1920 02/28/21 1231  GLUCAP 83 101*       Signed:  Kayleen Memos, MD [facility] 03/05/2021, 8:02 AM

## 2021-03-07 ENCOUNTER — Ambulatory Visit: Payer: Self-pay

## 2021-03-07 ENCOUNTER — Telehealth: Payer: Medicare Other

## 2021-03-07 DIAGNOSIS — N1831 Chronic kidney disease, stage 3a: Secondary | ICD-10-CM

## 2021-03-07 DIAGNOSIS — I10 Essential (primary) hypertension: Secondary | ICD-10-CM | POA: Diagnosis not present

## 2021-03-07 DIAGNOSIS — F02818 Dementia in other diseases classified elsewhere, unspecified severity, with other behavioral disturbance: Secondary | ICD-10-CM

## 2021-03-07 DIAGNOSIS — M549 Dorsalgia, unspecified: Secondary | ICD-10-CM | POA: Diagnosis not present

## 2021-03-07 DIAGNOSIS — E785 Hyperlipidemia, unspecified: Secondary | ICD-10-CM | POA: Diagnosis not present

## 2021-03-07 DIAGNOSIS — R451 Restlessness and agitation: Secondary | ICD-10-CM | POA: Diagnosis not present

## 2021-03-07 DIAGNOSIS — R269 Unspecified abnormalities of gait and mobility: Secondary | ICD-10-CM | POA: Diagnosis not present

## 2021-03-07 DIAGNOSIS — I5032 Chronic diastolic (congestive) heart failure: Secondary | ICD-10-CM

## 2021-03-07 DIAGNOSIS — I25118 Atherosclerotic heart disease of native coronary artery with other forms of angina pectoris: Secondary | ICD-10-CM

## 2021-03-07 DIAGNOSIS — D508 Other iron deficiency anemias: Secondary | ICD-10-CM | POA: Diagnosis not present

## 2021-03-07 DIAGNOSIS — K219 Gastro-esophageal reflux disease without esophagitis: Secondary | ICD-10-CM | POA: Diagnosis not present

## 2021-03-07 DIAGNOSIS — G301 Alzheimer's disease with late onset: Secondary | ICD-10-CM

## 2021-03-07 DIAGNOSIS — I5042 Chronic combined systolic (congestive) and diastolic (congestive) heart failure: Secondary | ICD-10-CM | POA: Diagnosis not present

## 2021-03-07 NOTE — Chronic Care Management (AMB) (Signed)
Chronic Care Management   CCM RN Visit Note  03/07/2021 Name: Sara Fox MRN: [id number] DOB: [birth year]  Subjective: Sara Fox is a 79 y.o. year old female who is a primary care patient of [name], [place]. The care management team was consulted for assistance with disease management and care coordination needs.    Collaboration with [facility]  for  patient update regarding [employer] benefit exception  in response to provider referral for case management and/or care coordination services.   Consent to Services:  The patient was given information about Chronic Care Management services, agreed to services, and gave verbal consent prior to initiation of services.  Please see initial visit note for detailed documentation.   Patient agreed to services and verbal consent obtained.   Assessment: Review of patient past medical history, allergies, medications, health status, including review of consultants reports, laboratory and other test data, was performed as part of comprehensive evaluation and provision of chronic care management services.   SDOH (Social Determinants of Health) assessments and interventions performed:    CCM Care Plan  Allergies  Allergen Reactions   Buprenorphine Hcl Shortness Of Breath    Throat swelling/trouble breathing and lethargic   Morphine And Related Shortness Of Breath    Throat swelling/trouble breathing and lethargic   Celebrex [Celecoxib] Diarrhea and Swelling    Swelling of legs    Vioxx [Rofecoxib] Palpitations   Codeine Other (See Comments)    Bloated     Outpatient Encounter Medications as of 03/07/2021  Medication Sig   aspirin EC 81 MG tablet Take 81 mg by mouth daily as needed (headache).   budesonide-formoterol (SYMBICORT) 160-4.5 MCG/ACT inhaler TAKE 2 PUFFS BY MOUTH TWICE A DAY (Patient taking differently: Inhale 2 puffs into the lungs in the morning and at bedtime.)   Calcium Citrate-Vitamin D (CALCIUM CITRATE+D3 PETITES PO)  Take 1 tablet by mouth daily.   cholecalciferol (VITAMIN D) 25 MCG (1000 UNIT) tablet Take 1,000 Units by mouth daily.   ciclopirox (PENLAC) 8 % solution Apply 1 application topically at bedtime. Apply over nail and surrounding skin. Apply daily over previous coat. After seven (7) days, file nail and continue cycle.   [employer] MAGNESIUM OXIDE 250 MG TABS TAKE 1 TABLET BY MOUTH WITH EVENING MEAL DAILY (Patient taking differently: Take 250 mg by mouth every evening.)   donepezil (ARICEPT) 10 MG tablet Take 1 tablet (10 mg total) by mouth at bedtime.   ferrous sulfate 325 (65 FE) MG tablet Take 325 mg by mouth daily with breakfast.   furosemide (LASIX) 20 MG tablet Take 1 tablet by mouth Mon - Fri daily (Patient taking differently: Take 20 mg by mouth See admin instructions. Monday-friday)   gabapentin (NEURONTIN) 100 MG capsule Take 1 capsule (100 mg total) by mouth 2 (two) times daily.   memantine (NAMENDA) 5 MG tablet Take 1 tablet (5 mg total) by mouth daily.   metoprolol tartrate (LOPRESSOR) 25 MG tablet Take 1 tablet (25 mg total) by mouth 2 (two) times daily.   Multiple Vitamin (MULTIVITAMIN WITH MINERALS) TABS tablet Take 1 tablet by mouth daily.   nitroGLYCERIN (NITROSTAT) 0.4 MG SL tablet PLACE ONE TABLET UNDER THE TONGUE EVERY 5 MINUTES AS NEEDED FOR CHEST PAIN. (Patient taking differently: Place 0.4 mg under the tongue every 5 (five) minutes as needed for chest pain.)   Omega-3 Fatty Acids (FISH OIL PO) Take 1 capsule by mouth daily.   oxybutynin (DITROPAN) 5 MG tablet Take 0.5 tablets (2.5 mg  total) by mouth 2 (two) times daily.   pantoprazole (PROTONIX) 40 MG tablet Take 1 tablet (40 mg total) by mouth 2 (two) times daily.   polyethylene glycol (MIRALAX / GLYCOLAX) packet Take 17 g by mouth daily as needed for mild constipation.   Potassium Chloride ER 20 MEQ TBCR Take 2 tablets by mouth every morning. (Patient taking differently: Take 40 mEq by mouth every morning.)   QUEtiapine (SEROQUEL) 50  MG tablet TAKE 1 TABLET BY MOUTH EVERYDAY AT BEDTIME (Patient taking differently: Take 50 mg by mouth at bedtime.)   rosuvastatin (CRESTOR) 40 MG tablet Take 1 tablet (40 mg total) by mouth daily. (Patient taking differently: Take 40 mg by mouth at bedtime.)   traMADol (ULTRAM) 50 MG tablet TAKE 1 TABLET 3 TIMES A DAY AND CAN TAKE 1 EXTRA 20 DAYS/MONTH (Patient taking differently: Take 100 mg by mouth 2 (two) times daily.)   No facility-administered encounter medications on file as of 03/07/2021.    Patient Active Problem List   Diagnosis Date Noted   Gait disturbance 02/27/2021   GERD (gastroesophageal reflux disease) 02/27/2021   Ambulatory dysfunction 02/27/2021   Transaminitis 02/27/2021   Fall (on)(from) incline, initial encounter 11/23/2020   Macrocytosis without anemia 11/22/2020   Unwitnessed fall 11/22/2020   Late onset Alzheimer's dementia with behavioral disturbance ([place]) 11/18/2020   Visual hallucination 11/18/2020   Sundowning 11/18/2020   Non-small cell carcinoma of right lung, stage 1 ([place]) 06/08/2020   Pulmonary nodule 1 cm or greater in diameter 05/21/2020   Atherosclerotic heart disease of native coronary artery with other forms of angina pectoris ([place]) 04/22/2020   Memory loss 02/18/2019   Vitamin B12 deficiency 10/23/2018   Stage 3 chronic kidney disease ([place]) 06/03/2018   Chronic anemia 06/03/2018   Hypercholesterolemia 06/03/2018   Malnutrition of moderate degree 07/12/2017   Hypoalbuminemia 07/11/2017   Elevated LFTs 07/11/2017   Snoring 12/21/2016   Peripheral musculoskeletal gait disorder 02/05/2015   Lumbar post-laminectomy syndrome 02/05/2015   CAD in native artery 10/16/2014   NSTEMI (non-ST elevated myocardial infarction) ([place]) 09/13/2014   Cardiomyopathy, ischemic 32/20/2542   Chronic systolic heart failure ([place])    Tobacco abuse 09/12/2014   Dyslipidemia 09/12/2014   Normocytic anemia 09/12/2014   Chronic diastolic heart failure, NYHA class 1  ([place]) 09/12/2014   Postlaminectomy syndrome, cervical region 08/01/2013   Chronic midline low back pain with left-sided sciatica 08/01/2013   Shoulder joint contracture 08/01/2013   HTN (hypertension) 03/03/2013   Abnormal genetic test 03/03/2013    Conditions to be addressed/monitored: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, Stage 3a chronic kidney disease, HTN, chronic systolic heart failure, Mild Cognitive Impairment, Atherosclerotic heart disease of native coronary artery with other forms of angina pectoris, Lung Nodule  Care Plan : Wellness (Adult)  Updates made by Lynne Logan, RN since 03/07/2021 12:00 AM     Problem: Medication Adherence (Wellness)   Priority: High     Goal: Medication Adherence and Medical Management of Chronic Heatlh Conditions Maintained   Start Date: 12/24/2020  Expected End Date: 04/13/2021  Recent Progress: On track  Priority: High  Note:   Current Barriers:  Ineffective Self Health Maintenance in a patient with Stage 3a chronic kidney disease, HTN, chronic systolic heart failure, Mild Cognitive Impairment, Atherosclerotic heart disease of native coronary artery with other forms of angina pectoris, Lung Nodule   Clinical Goal(s):  Collaboration with [name], FNP regarding development and update of comprehensive plan of care as evidenced by provider  attestation and co-signature Inter-disciplinary care team collaboration (see longitudinal plan of care) patient will work with care management team to address care coordination and chronic disease management needs related to Disease Management Educational Needs Care Coordination Medication Management and Education Psychosocial Support Dementia and Caregiver Support   Interventions:  03/01/21 completed successful outbound call with son [name] Determined patient was admitted to [facility] on 02/26/21 for dx: Ataxia Per patient's son, patient was negative for UTI  and negative for Stroke Discussed patient will be discharged to inpatient rehab once ready for discharge Educated son regarding advanced stages of Dementia including difficulty with balance and coordination resulting in increased falls, decreased mobility and the inability to perform self care  Discussed [name] will plan to have his mother discharged home following rehab at which time he will continue to locate a provider who will take over patient's medical management and make house calls  Discussed plans with patient for ongoing care management follow up and provided patient with direct contact information for care management team 03/03/21 Case Collaboration  Spoke with Jenny Reichmann from [place] advising this provider has requested an exception of benefits from [facility] in order to provide services to Sara Fox Discussed it may take 14 days for a final decision to be reached  Updated Jenny Reichmann regarding patients current IP stay with plans to discharge to SNF Advised patient's son [name] continues to plan to bring his mother home folowing rehab and would like [facility] to take over her medical management if approved Discussed [facility] provided son [name] with the contact name/number for Dr. Daphene Jaeger with [facility] Providers as an alternative if [facility] is not approved by health plan, [facility] is also contacted with this group Discussed Jenny Reichmann will keep this RN CM informed of the outcome of the benefit exception requested Sent secure message to PCP and embedded BSW with an update concerning patient status and [facility] efforts to provide services for patient upon her discharge home if approved by [facility] 03/07/21 Case Collaboration Received update regarding patient's discharge to acute rehab per [place], [facility] hospital liaison;  "This patient transitioned to [facility], I gave inpatient information regarding APS, contact person is for followup." -Received voice message  from [place], Reche Dixon RN advising [facility] has approved 3 initial home visits once patient is discharged from  [place] will be monitoring for patient's discharge at which time they will contact Sara Fox's caregiver and schedule her initial visit -Determined subsequent visits will be requested from [facility] accordingly and following the initial 3 visits -Placed outbound call to Cecille Rubin asking if she will be notifying patient's son [name] or if this RN CM will need to notify him of the plan following patient's discharge home -Sent in basket message to PCP provider [name], FNP and embedded BSW [name] with a patient status update regarding approval for [facility]   Self Care Activities:  With assistance from son [name], take all medications as prescribed Contact PCP and or embedded Pharm D for questions regarding medications or medication refills Ensure refills are on the way when medication supply is getting low Notify PCP if unable to take medications as prescribed  Patient Goals: - pending hospital discharge   Follow Up Plan: The care management team will reach out to the patient/son again upon patient's discharge home         Plan: The care management team will reach out to the  patient/son again upon patient's discharge home     Barb Merino, RN, BSN, CCM Care Management Coordinator [place] [facility]  Direct Phone: [phone]

## 2021-03-07 NOTE — Chronic Care Management (AMB) (Signed)
Chronic Care Management    Social Work Note  03/07/2021 Name: Sara Fox MRN: [id number] DOB: 1940-06-01  Sara Fox is a 80 y.o. year old female who is a primary care patient of [name], [place]. The CCM team was consulted to assist the patient with chronic disease management and/or care coordination needs related to:  HTN, CHF, CKD III, and Late Onset Alzheimer's Disease .   Collaboration with Consulting civil engineer and APS  for  Care Coordination  in response to provider referral for social work chronic care management and care coordination services.   Consent to Services:  The patient was given information about Chronic Care Management services, agreed to services, and gave verbal consent prior to initiation of services.  Please see initial visit note for detailed documentation.   Patient agreed to services and consent obtained.   Assessment: Review of patient past medical history, allergies, medications, and health status, including review of relevant consultants reports was performed today as part of a comprehensive evaluation and provision of chronic care management and care coordination services.     SDOH (Social Determinants of Health) assessments and interventions performed:    Advanced Directives Status: Not addressed in this encounter.  CCM Care Plan  Allergies  Allergen Reactions   Buprenorphine Hcl Shortness Of Breath    Throat swelling/trouble breathing and lethargic   Morphine And Related Shortness Of Breath    Throat swelling/trouble breathing and lethargic   Celebrex [Celecoxib] Diarrhea and Swelling    Swelling of legs    Vioxx [Rofecoxib] Palpitations   Codeine Other (See Comments)    Bloated     Outpatient Encounter Medications as of 03/07/2021  Medication Sig   aspirin EC 81 MG tablet Take 81 mg by mouth daily as needed (headache).   budesonide-formoterol (SYMBICORT) 160-4.5 MCG/ACT inhaler TAKE 2 PUFFS BY MOUTH TWICE A DAY (Patient taking differently:  Inhale 2 puffs into the lungs in the morning and at bedtime.)   Calcium Citrate-Vitamin D (CALCIUM CITRATE+D3 PETITES PO) Take 1 tablet by mouth daily.   cholecalciferol (VITAMIN D) 25 MCG (1000 UNIT) tablet Take 1,000 Units by mouth daily.   ciclopirox (PENLAC) 8 % solution Apply 1 application topically at bedtime. Apply over nail and surrounding skin. Apply daily over previous coat. After seven (7) days, file nail and continue cycle.   CVS MAGNESIUM OXIDE 250 MG TABS TAKE 1 TABLET BY MOUTH WITH EVENING MEAL DAILY (Patient taking differently: Take 250 mg by mouth every evening.)   donepezil (ARICEPT) 10 MG tablet Take 1 tablet (10 mg total) by mouth at bedtime.   ferrous sulfate 325 (65 FE) MG tablet Take 325 mg by mouth daily with breakfast.   furosemide (LASIX) 20 MG tablet Take 1 tablet by mouth Mon - Fri daily (Patient taking differently: Take 20 mg by mouth See admin instructions. Monday-friday)   gabapentin (NEURONTIN) 100 MG capsule Take 1 capsule (100 mg total) by mouth 2 (two) times daily.   memantine (NAMENDA) 5 MG tablet Take 1 tablet (5 mg total) by mouth daily.   metoprolol tartrate (LOPRESSOR) 25 MG tablet Take 1 tablet (25 mg total) by mouth 2 (two) times daily.   Multiple Vitamin (MULTIVITAMIN WITH MINERALS) TABS tablet Take 1 tablet by mouth daily.   nitroGLYCERIN (NITROSTAT) 0.4 MG SL tablet PLACE ONE TABLET UNDER THE TONGUE EVERY 5 MINUTES AS NEEDED FOR CHEST PAIN. (Patient taking differently: Place 0.4 mg under the tongue every 5 (five) minutes as needed for chest  pain.)   Omega-3 Fatty Acids (FISH OIL PO) Take 1 capsule by mouth daily.   oxybutynin (DITROPAN) 5 MG tablet Take 0.5 tablets (2.5 mg total) by mouth 2 (two) times daily.   pantoprazole (PROTONIX) 40 MG tablet Take 1 tablet (40 mg total) by mouth 2 (two) times daily.   polyethylene glycol (MIRALAX / GLYCOLAX) packet Take 17 g by mouth daily as needed for mild constipation.   Potassium Chloride ER 20 MEQ TBCR Take 2  tablets by mouth every morning. (Patient taking differently: Take 40 mEq by mouth every morning.)   QUEtiapine (SEROQUEL) 50 MG tablet TAKE 1 TABLET BY MOUTH EVERYDAY AT BEDTIME (Patient taking differently: Take 50 mg by mouth at bedtime.)   rosuvastatin (CRESTOR) 40 MG tablet Take 1 tablet (40 mg total) by mouth daily. (Patient taking differently: Take 40 mg by mouth at bedtime.)   traMADol (ULTRAM) 50 MG tablet TAKE 1 TABLET 3 TIMES A DAY AND CAN TAKE 1 EXTRA 20 DAYS/MONTH (Patient taking differently: Take 100 mg by mouth 2 (two) times daily.)   No facility-administered encounter medications on file as of 03/07/2021.    Patient Active Problem List   Diagnosis Date Noted   Gait disturbance 02/27/2021   GERD (gastroesophageal reflux disease) 02/27/2021   Ambulatory dysfunction 02/27/2021   Transaminitis 02/27/2021   Fall (on)(from) incline, initial encounter 11/23/2020   Macrocytosis without anemia 11/22/2020   Unwitnessed fall 11/22/2020   Late onset Alzheimer's dementia with behavioral disturbance ([place]) 11/18/2020   Visual hallucination 11/18/2020   Sundowning 11/18/2020   Non-small cell carcinoma of right lung, stage 1 ([place]) 06/08/2020   Pulmonary nodule 1 cm or greater in diameter 05/21/2020   Atherosclerotic heart disease of native coronary artery with other forms of angina pectoris ([place]) 04/22/2020   Memory loss 02/18/2019   Vitamin B12 deficiency 10/23/2018   Stage 3 chronic kidney disease ([place]) 06/03/2018   Chronic anemia 06/03/2018   Hypercholesterolemia 06/03/2018   Malnutrition of moderate degree 07/12/2017   Hypoalbuminemia 07/11/2017   Elevated LFTs 07/11/2017   Snoring 12/21/2016   Peripheral musculoskeletal gait disorder 02/05/2015   Lumbar post-laminectomy syndrome 02/05/2015   CAD in native artery 10/16/2014   NSTEMI (non-ST elevated myocardial infarction) ([place]) 09/13/2014   Cardiomyopathy, ischemic 96/22/2979   Chronic systolic heart failure ([place])    Tobacco  abuse 09/12/2014   Dyslipidemia 09/12/2014   Normocytic anemia 09/12/2014   Chronic diastolic heart failure, NYHA class 1 ([place]) 09/12/2014   Postlaminectomy syndrome, cervical region 08/01/2013   Chronic midline low back pain with left-sided sciatica 08/01/2013   Shoulder joint contracture 08/01/2013   HTN (hypertension) 03/03/2013   Abnormal genetic test 03/03/2013    Conditions to be addressed/monitored: CHF, HTN, CKD Stage III, and Late Onset Alzheimer's Disease ; Level of care concerns  Care Plan : Social Work [place]  Updates made by [name] since 03/07/2021 12:00 AM     Problem: Home and Family Safety (Wellness)      Goal: Home and Family Safety Maintained   Start Date: 03/07/2021  This Visit's Progress: On track  Priority: High  Note:   Current Barriers:  Chronic disease management support and education needs related to CHF, HTN, CKD Stage III, and Late Onset Alzheimer's   Level of care concerns  Social Worker Clinical Goal(s):  patient will work with SW to identify and address any acute and/or chronic care coordination needs related to the self health management of CHF, HTN, CKD Stage III, and Late  Onset Alzheimer's    SW will work with APS during investigation of patient safety in the home  SW Interventions:  Inter-disciplinary care team collaboration (see longitudinal plan of care) Collaboration with [name], FNP regarding development and update of comprehensive plan of care as evidenced by provider attestation and co-signature Voice message received from [name] with [facility] APS requesting SW feedback on the patient Collaboration with [place] to discuss patient is currently in rehab at [facility] with plans to hopefully enroll with [facility] as PCP upon returning home (this will be dependent on insurance approval) Unsuccessful outbound call placed to [name] [phone]) voice message left requesting a  return call  Follow Up Plan:  SW will communicate with APS once a return call is received           [name], BSW, CDP Social Worker, Certified Dementia Practitioner [place] [facility] [place] [facility] [phone]

## 2021-03-08 ENCOUNTER — Ambulatory Visit: Payer: Self-pay

## 2021-03-08 ENCOUNTER — Encounter: Payer: Medicare Other | Admitting: Physical Medicine & Rehabilitation

## 2021-03-08 DIAGNOSIS — F02818 Dementia in other diseases classified elsewhere, unspecified severity, with other behavioral disturbance: Secondary | ICD-10-CM

## 2021-03-08 DIAGNOSIS — K219 Gastro-esophageal reflux disease without esophagitis: Secondary | ICD-10-CM | POA: Diagnosis not present

## 2021-03-08 DIAGNOSIS — N1831 Chronic kidney disease, stage 3a: Secondary | ICD-10-CM

## 2021-03-08 DIAGNOSIS — D508 Other iron deficiency anemias: Secondary | ICD-10-CM | POA: Diagnosis not present

## 2021-03-08 DIAGNOSIS — I10 Essential (primary) hypertension: Secondary | ICD-10-CM

## 2021-03-08 DIAGNOSIS — I5042 Chronic combined systolic (congestive) and diastolic (congestive) heart failure: Secondary | ICD-10-CM | POA: Diagnosis not present

## 2021-03-08 DIAGNOSIS — R451 Restlessness and agitation: Secondary | ICD-10-CM | POA: Diagnosis not present

## 2021-03-08 DIAGNOSIS — M549 Dorsalgia, unspecified: Secondary | ICD-10-CM | POA: Diagnosis not present

## 2021-03-08 DIAGNOSIS — G301 Alzheimer's disease with late onset: Secondary | ICD-10-CM

## 2021-03-08 DIAGNOSIS — R269 Unspecified abnormalities of gait and mobility: Secondary | ICD-10-CM | POA: Diagnosis not present

## 2021-03-08 DIAGNOSIS — I5032 Chronic diastolic (congestive) heart failure: Secondary | ICD-10-CM

## 2021-03-08 DIAGNOSIS — E785 Hyperlipidemia, unspecified: Secondary | ICD-10-CM | POA: Diagnosis not present

## 2021-03-08 DIAGNOSIS — R5381 Other malaise: Secondary | ICD-10-CM | POA: Diagnosis not present

## 2021-03-08 NOTE — Patient Instructions (Signed)
Social Worker Visit Information  Goals we discussed today:   Goals Addressed             This Visit's Progress    COMPLETED: Home and Family Safety Maintained       Timeframe:  Short-Term Goal Priority:  High Start Date:   10.24.22                                                      Follow Up Plan:  No SW follow up planned. Patient will remain active with RN Care Manager   [name], BSW, CDP Social Worker, Certified Dementia Practitioner [place] [facility] [place] [facility] [phone]

## 2021-03-08 NOTE — Chronic Care Management (AMB) (Signed)
Chronic Care Management    Social Work Note  03/08/2021 Name: Sara Fox MRN: [id number] DOB: November 01, 1940  Sara Fox is a 80 y.o. year old female who is a primary care patient of Sara Fox, Sara Fox. The CCM team was consulted to assist the patient with chronic disease management and/or care coordination needs related to:  HTN, CHF, CKD III, Late Onset Alzheimer's Disease .   Collaboration with [name] with APS  for  case discussion  in response to provider referral for social work chronic care management and care coordination services.   Consent to Services:  The patient was given information about Chronic Care Management services, agreed to services, and gave verbal consent prior to initiation of services.  Please see initial visit note for detailed documentation.   Patient agreed to services and consent obtained.   Assessment: Review of patient past medical history, allergies, medications, and health status, including review of relevant consultants reports was performed today as part of a comprehensive evaluation and provision of chronic care management and care coordination services.     SDOH (Social Determinants of Health) assessments and interventions performed:    Advanced Directives Status: Not addressed in this encounter.  CCM Care Plan  Allergies  Allergen Reactions   Buprenorphine Hcl Shortness Of Breath    Throat swelling/trouble breathing and lethargic   Morphine And Related Shortness Of Breath    Throat swelling/trouble breathing and lethargic   Celebrex [Celecoxib] Diarrhea and Swelling    Swelling of legs    Vioxx [Rofecoxib] Palpitations   Codeine Other (See Comments)    Bloated     Outpatient Encounter Medications as of 03/08/2021  Medication Sig   aspirin EC 81 MG tablet Take 81 mg by mouth daily as needed (headache).   budesonide-formoterol (SYMBICORT) 160-4.5 MCG/ACT inhaler TAKE 2 PUFFS BY MOUTH TWICE A DAY (Patient taking differently: Inhale 2  puffs into the lungs in the morning and at bedtime.)   Calcium Citrate-Vitamin D (CALCIUM CITRATE+D3 PETITES PO) Take 1 tablet by mouth daily.   cholecalciferol (VITAMIN D) 25 MCG (1000 UNIT) tablet Take 1,000 Units by mouth daily.   ciclopirox (PENLAC) 8 % solution Apply 1 application topically at bedtime. Apply over nail and surrounding skin. Apply daily over previous coat. After seven (7) days, file nail and continue cycle.   [employer] MAGNESIUM OXIDE 250 MG TABS TAKE 1 TABLET BY MOUTH WITH EVENING MEAL DAILY (Patient taking differently: Take 250 mg by mouth every evening.)   donepezil (ARICEPT) 10 MG tablet Take 1 tablet (10 mg total) by mouth at bedtime.   ferrous sulfate 325 (65 FE) MG tablet Take 325 mg by mouth daily with breakfast.   furosemide (LASIX) 20 MG tablet Take 1 tablet by mouth Mon - Fri daily (Patient taking differently: Take 20 mg by mouth See admin instructions. Monday-friday)   gabapentin (NEURONTIN) 100 MG capsule Take 1 capsule (100 mg total) by mouth 2 (two) times daily.   memantine (NAMENDA) 5 MG tablet Take 1 tablet (5 mg total) by mouth daily.   metoprolol tartrate (LOPRESSOR) 25 MG tablet Take 1 tablet (25 mg total) by mouth 2 (two) times daily.   Multiple Vitamin (MULTIVITAMIN WITH MINERALS) TABS tablet Take 1 tablet by mouth daily.   nitroGLYCERIN (NITROSTAT) 0.4 MG SL tablet PLACE ONE TABLET UNDER THE TONGUE EVERY 5 MINUTES AS NEEDED FOR CHEST PAIN. (Patient taking differently: Place 0.4 mg under the tongue every 5 (five) minutes as needed for chest pain.)  Omega-3 Fatty Acids (FISH OIL PO) Take 1 capsule by mouth daily.   oxybutynin (DITROPAN) 5 MG tablet Take 0.5 tablets (2.5 mg total) by mouth 2 (two) times daily.   pantoprazole (PROTONIX) 40 MG tablet Take 1 tablet (40 mg total) by mouth 2 (two) times daily.   polyethylene glycol (MIRALAX / GLYCOLAX) packet Take 17 g by mouth daily as needed for mild constipation.   Potassium Chloride ER 20 MEQ TBCR Take 2 tablets by  mouth every morning. (Patient taking differently: Take 40 mEq by mouth every morning.)   QUEtiapine (SEROQUEL) 50 MG tablet TAKE 1 TABLET BY MOUTH EVERYDAY AT BEDTIME (Patient taking differently: Take 50 mg by mouth at bedtime.)   rosuvastatin (CRESTOR) 40 MG tablet Take 1 tablet (40 mg total) by mouth daily. (Patient taking differently: Take 40 mg by mouth at bedtime.)   traMADol (ULTRAM) 50 MG tablet TAKE 1 TABLET 3 TIMES A DAY AND CAN TAKE 1 EXTRA 20 DAYS/MONTH (Patient taking differently: Take 100 mg by mouth 2 (two) times daily.)   No facility-administered encounter medications on file as of 03/08/2021.    Patient Active Problem List   Diagnosis Date Noted   Gait disturbance 02/27/2021   GERD (gastroesophageal reflux disease) 02/27/2021   Ambulatory dysfunction 02/27/2021   Transaminitis 02/27/2021   Fall (on)(from) incline, initial encounter 11/23/2020   Macrocytosis without anemia 11/22/2020   Unwitnessed fall 11/22/2020   Late onset Alzheimer's dementia with behavioral disturbance ([place]) 11/18/2020   Visual hallucination 11/18/2020   Sundowning 11/18/2020   Non-small cell carcinoma of right lung, stage 1 ([place]) 06/08/2020   Pulmonary nodule 1 cm or greater in diameter 05/21/2020   Atherosclerotic heart disease of native coronary artery with other forms of angina pectoris ([place]) 04/22/2020   Memory loss 02/18/2019   Vitamin B12 deficiency 10/23/2018   Stage 3 chronic kidney disease ([place]) 06/03/2018   Chronic anemia 06/03/2018   Hypercholesterolemia 06/03/2018   Malnutrition of moderate degree 07/12/2017   Hypoalbuminemia 07/11/2017   Elevated LFTs 07/11/2017   Snoring 12/21/2016   Peripheral musculoskeletal gait disorder 02/05/2015   Lumbar post-laminectomy syndrome 02/05/2015   CAD in native artery 10/16/2014   NSTEMI (non-ST elevated myocardial infarction) ([place]) 09/13/2014   Cardiomyopathy, ischemic 16/02/9603   Chronic systolic heart failure ([place])    Tobacco abuse  09/12/2014   Dyslipidemia 09/12/2014   Normocytic anemia 09/12/2014   Chronic diastolic heart failure, NYHA class 1 ([place]) 09/12/2014   Postlaminectomy syndrome, cervical region 08/01/2013   Chronic midline low back pain with left-sided sciatica 08/01/2013   Shoulder joint contracture 08/01/2013   HTN (hypertension) 03/03/2013   Abnormal genetic test 03/03/2013    Conditions to be addressed/monitored: CHF, HTN, CKD Stage III, and Late Onset Alzheimers ; Level of care concerns  Care Plan : Social Work [place]  Updates made by [name] since 03/08/2021 12:00 AM  Completed 03/08/2021   Problem: Home and Family Safety (Wellness) Resolved 03/08/2021     Goal: Home and Family Safety Maintained Completed 03/08/2021  Start Date: 03/07/2021  Recent Progress: On track  Priority: High  Note:   Current Barriers:  Chronic disease management support and education needs related to CHF, HTN, CKD Stage III, and Late Onset Alzheimer's   Level of care concerns  Social Worker Clinical Goal(s):  patient will work with SW to identify and address any acute and/or chronic care coordination needs related to the self health management of CHF, HTN, CKD Stage III, and Late Onset Alzheimer's  SW will work with APS during investigation of patient safety in the home  SW Interventions:  Inter-disciplinary care team collaboration (see longitudinal plan of care) Collaboration with Sara Brine, FNP regarding development and update of comprehensive plan of care as evidenced by provider attestation and co-signature Inbound call from [name] with [facility] APS to confirm patient did come to the office to be checked for a uti Advised Mrs Tim Lair the patients son did bring her to the clinic on 10/14 to be checked for a UTI but the sample provided was not appropriate as the office feels the patient put water and soap in the specimen cup Discussed the patient was hospitalized 10/15 - 10/22 for  Ataxia and has discharged to short term rehab [name] reports she spoke with the patients son who reports he has asked for help multiple times and "we aren't helping him" Advised [name] this SW has provided education and application for long term care Medicaid and [facility] multiple times but there has not been follow through from the son Discussed SW is unsure if [name] has the capacity to understand process or just lack of follow through as he repeatedly asks for help but does not follow up on application processes Advised SW last mailed a long term care Medicaid application on 22/0 and confirmed receipt on 10/12 but [name] had then decided not to place the patient Informed [name] will contact [name] to review the process for placement and long term care Medicaid to confirm he is knowledgeable of the process Advised [name] that once patient is discharged home her son plans to seek services from an in home physician to alleviate concerns with getting her to and from provider visits Collaboration with Sara Brine FNP and [place] to update on APS SW plan  Follow Up Plan:  No follow up planned - SW has communicated knowledge of patient case to APD       Follow Up Plan:  No SW follow up planned at this time. The patient will remain engaged with RN Care Manager to address care management needs.      [name], BSW, CDP Social Worker, Certified Dementia Practitioner [place] [facility] [phone]

## 2021-03-10 ENCOUNTER — Ambulatory Visit: Payer: Self-pay

## 2021-03-10 DIAGNOSIS — N1831 Chronic kidney disease, stage 3a: Secondary | ICD-10-CM

## 2021-03-10 DIAGNOSIS — I10 Essential (primary) hypertension: Secondary | ICD-10-CM

## 2021-03-10 DIAGNOSIS — G301 Alzheimer's disease with late onset: Secondary | ICD-10-CM

## 2021-03-10 DIAGNOSIS — I5032 Chronic diastolic (congestive) heart failure: Secondary | ICD-10-CM

## 2021-03-10 NOTE — Chronic Care Management (AMB) (Signed)
Chronic Care Management    Social Work Note  03/10/2021 Name: Sara Fox MRN: [id number] DOB: 15-Apr-1941  Sara Fox is a 80 y.o. year old female who is a primary care patient of [name], [place]. The CCM team was consulted to assist the patient with chronic disease management and/or care coordination needs related to:  HTN, CHF, CKD III, Late Onset Alzheimer's Disease .   Collaboration with APS and Primary Care Provider  for  case discussion  in response to provider referral for social work chronic care management and care coordination services.   Consent to Services:  The patient was given information about Chronic Care Management services, agreed to services, and gave verbal consent prior to initiation of services.  Please see initial visit note for detailed documentation.   Patient agreed to services and consent obtained.   Assessment: Review of patient past medical history, allergies, medications, and health status, including review of relevant consultants reports was performed today as part of a comprehensive evaluation and provision of chronic care management and care coordination services.     SDOH (Social Determinants of Health) assessments and interventions performed:    Advanced Directives Status: Not addressed in this encounter.  CCM Care Plan  Allergies  Allergen Reactions   Buprenorphine Hcl Shortness Of Breath    Throat swelling/trouble breathing and lethargic   Morphine And Related Shortness Of Breath    Throat swelling/trouble breathing and lethargic   Celebrex [Celecoxib] Diarrhea and Swelling    Swelling of legs    Vioxx [Rofecoxib] Palpitations   Codeine Other (See Comments)    Bloated     Outpatient Encounter Medications as of 03/10/2021  Medication Sig   aspirin EC 81 MG tablet Take 81 mg by mouth daily as needed (headache).   budesonide-formoterol (SYMBICORT) 160-4.5 MCG/ACT inhaler TAKE 2 PUFFS BY MOUTH TWICE A DAY (Patient taking differently:  Inhale 2 puffs into the lungs in the morning and at bedtime.)   Calcium Citrate-Vitamin D (CALCIUM CITRATE+D3 PETITES PO) Take 1 tablet by mouth daily.   cholecalciferol (VITAMIN D) 25 MCG (1000 UNIT) tablet Take 1,000 Units by mouth daily.   ciclopirox (PENLAC) 8 % solution Apply 1 application topically at bedtime. Apply over nail and surrounding skin. Apply daily over previous coat. After seven (7) days, file nail and continue cycle.   CVS MAGNESIUM OXIDE 250 MG TABS TAKE 1 TABLET BY MOUTH WITH EVENING MEAL DAILY (Patient taking differently: Take 250 mg by mouth every evening.)   donepezil (ARICEPT) 10 MG tablet Take 1 tablet (10 mg total) by mouth at bedtime.   ferrous sulfate 325 (65 FE) MG tablet Take 325 mg by mouth daily with breakfast.   furosemide (LASIX) 20 MG tablet Take 1 tablet by mouth Mon - Fri daily (Patient taking differently: Take 20 mg by mouth See admin instructions. Monday-friday)   gabapentin (NEURONTIN) 100 MG capsule Take 1 capsule (100 mg total) by mouth 2 (two) times daily.   memantine (NAMENDA) 5 MG tablet Take 1 tablet (5 mg total) by mouth daily.   metoprolol tartrate (LOPRESSOR) 25 MG tablet Take 1 tablet (25 mg total) by mouth 2 (two) times daily.   Multiple Vitamin (MULTIVITAMIN WITH MINERALS) TABS tablet Take 1 tablet by mouth daily.   nitroGLYCERIN (NITROSTAT) 0.4 MG SL tablet PLACE ONE TABLET UNDER THE TONGUE EVERY 5 MINUTES AS NEEDED FOR CHEST PAIN. (Patient taking differently: Place 0.4 mg under the tongue every 5 (five) minutes as needed for chest pain.)  Omega-3 Fatty Acids (FISH OIL PO) Take 1 capsule by mouth daily.   oxybutynin (DITROPAN) 5 MG tablet Take 0.5 tablets (2.5 mg total) by mouth 2 (two) times daily.   pantoprazole (PROTONIX) 40 MG tablet Take 1 tablet (40 mg total) by mouth 2 (two) times daily.   polyethylene glycol (MIRALAX / GLYCOLAX) packet Take 17 g by mouth daily as needed for mild constipation.   Potassium Chloride ER 20 MEQ TBCR Take 2  tablets by mouth every morning. (Patient taking differently: Take 40 mEq by mouth every morning.)   QUEtiapine (SEROQUEL) 50 MG tablet TAKE 1 TABLET BY MOUTH EVERYDAY AT BEDTIME (Patient taking differently: Take 50 mg by mouth at bedtime.)   rosuvastatin (CRESTOR) 40 MG tablet Take 1 tablet (40 mg total) by mouth daily. (Patient taking differently: Take 40 mg by mouth at bedtime.)   traMADol (ULTRAM) 50 MG tablet TAKE 1 TABLET 3 TIMES A DAY AND CAN TAKE 1 EXTRA 20 DAYS/MONTH (Patient taking differently: Take 100 mg by mouth 2 (two) times daily.)   No facility-administered encounter medications on file as of 03/10/2021.    Patient Active Problem List   Diagnosis Date Noted   Gait disturbance 02/27/2021   GERD (gastroesophageal reflux disease) 02/27/2021   Ambulatory dysfunction 02/27/2021   Transaminitis 02/27/2021   Fall (on)(from) incline, initial encounter 11/23/2020   Macrocytosis without anemia 11/22/2020   Unwitnessed fall 11/22/2020   Late onset Alzheimer's dementia with behavioral disturbance ([place]) 11/18/2020   Visual hallucination 11/18/2020   Sundowning 11/18/2020   Non-small cell carcinoma of right lung, stage 1 ([place]) 06/08/2020   Pulmonary nodule 1 cm or greater in diameter 05/21/2020   Atherosclerotic heart disease of native coronary artery with other forms of angina pectoris ([place]) 04/22/2020   Memory loss 02/18/2019   Vitamin B12 deficiency 10/23/2018   Stage 3 chronic kidney disease ([place]) 06/03/2018   Chronic anemia 06/03/2018   Hypercholesterolemia 06/03/2018   Malnutrition of moderate degree 07/12/2017   Hypoalbuminemia 07/11/2017   Elevated LFTs 07/11/2017   Snoring 12/21/2016   Peripheral musculoskeletal gait disorder 02/05/2015   Lumbar post-laminectomy syndrome 02/05/2015   CAD in native artery 10/16/2014   NSTEMI (non-ST elevated myocardial infarction) ([place]) 09/13/2014   Cardiomyopathy, ischemic 41/28/7867   Chronic systolic heart failure ([name])    Tobacco  abuse 09/12/2014   Dyslipidemia 09/12/2014   Normocytic anemia 09/12/2014   Chronic diastolic heart failure, NYHA class 1 ([place]) 09/12/2014   Postlaminectomy syndrome, cervical region 08/01/2013   Chronic midline low back pain with left-sided sciatica 08/01/2013   Shoulder joint contracture 08/01/2013   HTN (hypertension) 03/03/2013   Abnormal genetic test 03/03/2013    Conditions to be addressed/monitored: CHF, HTN, CKD Stage III, and Late Onset Alzheimer's Disease ; Level of care concerns  Care Plan : General Social Work (Adult)  Updates made by [name] since 03/10/2021 12:00 AM  Completed 03/10/2021   Problem: Home and Family Safety (Wellness) Resolved 03/10/2021     Goal: Home and Family Safety Maintained Completed 03/10/2021  This Visit's Progress: On track  Priority: High  Note:   Current Barriers:  Chronic disease management support and education needs related to CHF, HTN, CKD Stage III, and Late Onset Alzheimer's   Level of care concerns   Social Worker Clinical Goal(s):  patient will work with SW to identify and address any acute and/or chronic care coordination needs related to the self health management of CHF, HTN, CKD Stage III, and Late Onset Alzheimer's  SW will work with APS during investigation of patient safety in the home   SW Interventions:  Inter-disciplinary care team collaboration (see longitudinal plan of care) Collaboration with [name], [place] regarding development and update of comprehensive plan of care as evidenced by provider attestation and co-signature Communication received from [name] indicating the patients APS case would be closed after investigating and determining the patient "does not need protective services at this time" Collaboration with [name] FNP and [place] to advise of APS case closure SW to sign off       Follow Up Plan:  No SW follow up planned at this time. The patient will remain  engaged with RN Care Manager to address care management needs.      [name], BSW, CDP Social Worker, Certified Dementia Practitioner [place] [facility] [phone]

## 2021-03-14 ENCOUNTER — Ambulatory Visit: Payer: Medicare Other | Admitting: Internal Medicine

## 2021-03-14 DIAGNOSIS — M549 Dorsalgia, unspecified: Secondary | ICD-10-CM | POA: Diagnosis not present

## 2021-03-14 DIAGNOSIS — I10 Essential (primary) hypertension: Secondary | ICD-10-CM

## 2021-03-14 DIAGNOSIS — N1831 Chronic kidney disease, stage 3a: Secondary | ICD-10-CM | POA: Diagnosis not present

## 2021-03-14 DIAGNOSIS — E785 Hyperlipidemia, unspecified: Secondary | ICD-10-CM | POA: Diagnosis not present

## 2021-03-14 DIAGNOSIS — F02818 Dementia in other diseases classified elsewhere, unspecified severity, with other behavioral disturbance: Secondary | ICD-10-CM

## 2021-03-14 DIAGNOSIS — I5032 Chronic diastolic (congestive) heart failure: Secondary | ICD-10-CM

## 2021-03-14 DIAGNOSIS — M25562 Pain in left knee: Secondary | ICD-10-CM | POA: Diagnosis not present

## 2021-03-14 DIAGNOSIS — C3491 Malignant neoplasm of unspecified part of right bronchus or lung: Secondary | ICD-10-CM

## 2021-03-14 DIAGNOSIS — G301 Alzheimer's disease with late onset: Secondary | ICD-10-CM | POA: Diagnosis not present

## 2021-03-14 DIAGNOSIS — K219 Gastro-esophageal reflux disease without esophagitis: Secondary | ICD-10-CM | POA: Diagnosis not present

## 2021-03-14 DIAGNOSIS — I25118 Atherosclerotic heart disease of native coronary artery with other forms of angina pectoris: Secondary | ICD-10-CM | POA: Diagnosis not present

## 2021-03-14 DIAGNOSIS — D508 Other iron deficiency anemias: Secondary | ICD-10-CM | POA: Diagnosis not present

## 2021-03-14 DIAGNOSIS — R269 Unspecified abnormalities of gait and mobility: Secondary | ICD-10-CM | POA: Diagnosis not present

## 2021-03-17 ENCOUNTER — Ambulatory Visit (INDEPENDENT_AMBULATORY_CARE_PROVIDER_SITE_OTHER): Payer: Medicare Other

## 2021-03-17 ENCOUNTER — Telehealth: Payer: Medicare Other

## 2021-03-17 DIAGNOSIS — I10 Essential (primary) hypertension: Secondary | ICD-10-CM

## 2021-03-17 DIAGNOSIS — C3491 Malignant neoplasm of unspecified part of right bronchus or lung: Secondary | ICD-10-CM

## 2021-03-17 DIAGNOSIS — N1831 Chronic kidney disease, stage 3a: Secondary | ICD-10-CM

## 2021-03-17 DIAGNOSIS — I25118 Atherosclerotic heart disease of native coronary artery with other forms of angina pectoris: Secondary | ICD-10-CM

## 2021-03-17 DIAGNOSIS — G301 Alzheimer's disease with late onset: Secondary | ICD-10-CM

## 2021-03-17 DIAGNOSIS — I5032 Chronic diastolic (congestive) heart failure: Secondary | ICD-10-CM

## 2021-03-18 DIAGNOSIS — R269 Unspecified abnormalities of gait and mobility: Secondary | ICD-10-CM | POA: Diagnosis not present

## 2021-03-18 DIAGNOSIS — M549 Dorsalgia, unspecified: Secondary | ICD-10-CM | POA: Diagnosis not present

## 2021-03-18 DIAGNOSIS — I10 Essential (primary) hypertension: Secondary | ICD-10-CM | POA: Diagnosis not present

## 2021-03-18 DIAGNOSIS — M25562 Pain in left knee: Secondary | ICD-10-CM | POA: Diagnosis not present

## 2021-03-18 DIAGNOSIS — M25561 Pain in right knee: Secondary | ICD-10-CM | POA: Diagnosis not present

## 2021-03-18 DIAGNOSIS — R451 Restlessness and agitation: Secondary | ICD-10-CM | POA: Diagnosis not present

## 2021-03-18 NOTE — Chronic Care Management (AMB) (Signed)
Chronic Care Management   CCM RN Visit Note  03/17/2021 Name: Sara Fox MRN: [id number] DOB: 1940-06-07  Subjective: Sara Fox is a 80 y.o. year old female who is a primary care patient of [name], [place]. The care management team was consulted for assistance with disease management and care coordination needs.    Engaged with patient by telephone for follow up visit in response to provider referral for case management and/or care coordination services.   Consent to Services:  The patient was given information about Chronic Care Management services, agreed to services, and gave verbal consent prior to initiation of services.  Please see initial visit note for detailed documentation.   Patient agreed to services and verbal consent obtained.   Assessment: Review of patient past medical history, allergies, medications, health status, including review of consultants reports, laboratory and other test data, was performed as part of comprehensive evaluation and provision of chronic care management services.   SDOH (Social Determinants of Health) assessments and interventions performed:    CCM Care Plan  Allergies  Allergen Reactions   Buprenorphine Hcl Shortness Of Breath    Throat swelling/trouble breathing and lethargic   Morphine And Related Shortness Of Breath    Throat swelling/trouble breathing and lethargic   Celebrex [Celecoxib] Diarrhea and Swelling    Swelling of legs    Vioxx [Rofecoxib] Palpitations   Codeine Other (See Comments)    Bloated     Outpatient Encounter Medications as of 03/17/2021  Medication Sig   aspirin EC 81 MG tablet Take 81 mg by mouth daily as needed (headache).   budesonide-formoterol (SYMBICORT) 160-4.5 MCG/ACT inhaler TAKE 2 PUFFS BY MOUTH TWICE A DAY (Patient taking differently: Inhale 2 puffs into the lungs in the morning and at bedtime.)   Calcium Citrate-Vitamin D (CALCIUM CITRATE+D3 PETITES PO) Take 1 tablet by mouth daily.    cholecalciferol (VITAMIN D) 25 MCG (1000 UNIT) tablet Take 1,000 Units by mouth daily.   ciclopirox (PENLAC) 8 % solution Apply 1 application topically at bedtime. Apply over nail and surrounding skin. Apply daily over previous coat. After seven (7) days, file nail and continue cycle.   [employer] MAGNESIUM OXIDE 250 MG TABS TAKE 1 TABLET BY MOUTH WITH EVENING MEAL DAILY (Patient taking differently: Take 250 mg by mouth every evening.)   donepezil (ARICEPT) 10 MG tablet Take 1 tablet (10 mg total) by mouth at bedtime.   ferrous sulfate 325 (65 FE) MG tablet Take 325 mg by mouth daily with breakfast.   furosemide (LASIX) 20 MG tablet Take 1 tablet by mouth Mon - Fri daily (Patient taking differently: Take 20 mg by mouth See admin instructions. Monday-friday)   gabapentin (NEURONTIN) 100 MG capsule Take 1 capsule (100 mg total) by mouth 2 (two) times daily.   memantine (NAMENDA) 5 MG tablet Take 1 tablet (5 mg total) by mouth daily.   metoprolol tartrate (LOPRESSOR) 25 MG tablet Take 1 tablet (25 mg total) by mouth 2 (two) times daily.   Multiple Vitamin (MULTIVITAMIN WITH MINERALS) TABS tablet Take 1 tablet by mouth daily.   nitroGLYCERIN (NITROSTAT) 0.4 MG SL tablet PLACE ONE TABLET UNDER THE TONGUE EVERY 5 MINUTES AS NEEDED FOR CHEST PAIN. (Patient taking differently: Place 0.4 mg under the tongue every 5 (five) minutes as needed for chest pain.)   Omega-3 Fatty Acids (FISH OIL PO) Take 1 capsule by mouth daily.   oxybutynin (DITROPAN) 5 MG tablet Take 0.5 tablets (2.5 mg total) by mouth 2 (two)  times daily.   pantoprazole (PROTONIX) 40 MG tablet Take 1 tablet (40 mg total) by mouth 2 (two) times daily.   polyethylene glycol (MIRALAX / GLYCOLAX) packet Take 17 g by mouth daily as needed for mild constipation.   Potassium Chloride ER 20 MEQ TBCR Take 2 tablets by mouth every morning. (Patient taking differently: Take 40 mEq by mouth every morning.)   QUEtiapine (SEROQUEL) 50 MG tablet TAKE 1 TABLET BY MOUTH  EVERYDAY AT BEDTIME (Patient taking differently: Take 50 mg by mouth at bedtime.)   rosuvastatin (CRESTOR) 40 MG tablet Take 1 tablet (40 mg total) by mouth daily. (Patient taking differently: Take 40 mg by mouth at bedtime.)   traMADol (ULTRAM) 50 MG tablet TAKE 1 TABLET 3 TIMES A DAY AND CAN TAKE 1 EXTRA 20 DAYS/MONTH (Patient taking differently: Take 100 mg by mouth 2 (two) times daily.)   No facility-administered encounter medications on file as of 03/17/2021.    Patient Active Problem List   Diagnosis Date Noted   Gait disturbance 02/27/2021   GERD (gastroesophageal reflux disease) 02/27/2021   Ambulatory dysfunction 02/27/2021   Transaminitis 02/27/2021   Fall (on)(from) incline, initial encounter 11/23/2020   Macrocytosis without anemia 11/22/2020   Unwitnessed fall 11/22/2020   Late onset Alzheimer's dementia with behavioral disturbance ([place]) 11/18/2020   Visual hallucination 11/18/2020   Sundowning 11/18/2020   Non-small cell carcinoma of right lung, stage 1 ([place]) 06/08/2020   Pulmonary nodule 1 cm or greater in diameter 05/21/2020   Atherosclerotic heart disease of native coronary artery with other forms of angina pectoris ([place]) 04/22/2020   Memory loss 02/18/2019   Vitamin B12 deficiency 10/23/2018   Stage 3 chronic kidney disease ([place]) 06/03/2018   Chronic anemia 06/03/2018   Hypercholesterolemia 06/03/2018   Malnutrition of moderate degree 07/12/2017   Hypoalbuminemia 07/11/2017   Elevated LFTs 07/11/2017   Snoring 12/21/2016   Peripheral musculoskeletal gait disorder 02/05/2015   Lumbar post-laminectomy syndrome 02/05/2015   CAD in native artery 10/16/2014   NSTEMI (non-ST elevated myocardial infarction) ([place]) 09/13/2014   Cardiomyopathy, ischemic 23/76/2831   Chronic systolic heart failure ([place])    Tobacco abuse 09/12/2014   Dyslipidemia 09/12/2014   Normocytic anemia 09/12/2014   Chronic diastolic heart failure, NYHA class 1 ([place]) 09/12/2014   Postlaminectomy  syndrome, cervical region 08/01/2013   Chronic midline low back pain with left-sided sciatica 08/01/2013   Shoulder joint contracture 08/01/2013   HTN (hypertension) 03/03/2013   Abnormal genetic test 03/03/2013    Conditions to be addressed/monitored: 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, Stage 3a chronic kidney disease, HTN, chronic systolic heart failure, Mild Cognitive Impairment, Atherosclerotic heart disease of native coronary artery with other forms of angina pectoris, Lung Nodule  Care Plan : Dementia (Adult)  Updates made by Lynne Logan, RN since 03/17/2021 12:00 AM     Problem: Cognitive Function   Priority: High     Long-Range Goal: Optimal Cognitive Function   Start Date: 07/26/2020  Expected End Date: 07/26/2021  Recent Progress: On track  Priority: High  Note:   Current Barriers:  Ineffective Self Health Maintenance Unable to self administer medications as prescribed Unable to perform ADLs independently Unable to perform IADLs independently Cognitive deficit  Clinical Goal(s):  Collaboration with [name], FNP regarding development and update of comprehensive plan of care as evidenced by provider attestation and co-signature Inter-disciplinary care team collaboration (see longitudinal plan of care) patient will work with care management team to address care coordination and chronic  disease management needs related to Disease Management Educational Needs Care Coordination Medication Management and Education Psychosocial Support Dementia and Caregiver Support   Interventions:  02/21/21 completed successful outbound call with son [name]  Evaluation of current treatment plan related to Dementia and patient's adherence to plan as established by provider. Collaboration with [name], FNP regarding development and update of comprehensive plan of care as evidenced by provider attestation       and co-signature Inter-disciplinary care  team collaboration (see longitudinal plan of care) Determined [name] is calling today to advise he received a home visit from a DSS worker this past Friday Determined [name] is aware the referral for APS was placed via this office/PCP Validated Mr. Virginia Crews feelings and provided active listening to his concerns regarding his mother's declining cognition and his attempts to care for her Discussed [name] plans to bring Ms. Rucinski in this morning for a urine check to rule out UTI due increased agitation over the past several days Discussed stat [facility] order for SNV last week, discussed the [facility] agency contacted the PCP to advise this visit was refused Determined the [facility] agency contact Ms. Redinger's granddaughter instead of [name], his niece is not involved with Ms. Herringshaw's care but is listed as a secondary contact Determined patient's granddaughter did not schedule a nurse visit due to the family having 4 large dogs in the home and they did not want any safety concerns for the nurse Determined [name] has since removed the dogs from their home and feels it is now safe for home care, although he was instructed by DSS to bring patient in for a home visit for evaluation of UTI Collaborated with PCP [name], FNP to confirm patient may stop by the office for a urine and she is agreeable   Reinforced the importance for [name] to try to stay calm when redirecting patient and assisting her with ADL's/iADL's in consideration of her advanced dementia Further discussed and educated about resources such as PACE, [facility] and [facility] house call to consider for establishment in order to ensure patient receives the care needed Determined [name] continues to consider having his mother placed into a long term nursing facility if approved and if he can locate a facility he feels will provide her with good care, he plans to complete the Medicaid application sent by embedded BSW, this weekend and will  take it to DSS in person Provided [name] with the contact information for these providers and encouraged him to call each provider, taking notes on what services may be provided to help him determined which of these may be the best option for Ms. Calles  Determined [name] is interested in having his mother placed on Ativan for agitation Discussed and reviewed having [name] schedule a follow up with Neurology for best evaluation and determination of further pharmacological recommendations such as Ativan to help with agitation Discussed plans with patient for ongoing care management follow up and provided patient with direct contact information for care management team 02/21/21 Case Collaboration  Determined Ms. Harkless was unable to leave a urine sample while at PCP office today Collaborated with PCP and embedded [place] regarding long term care planning for Ms. [place] outbound call to [employer], spoke with Jenny Reichmann who advised [employer] is out of network, however this provider will contact [place] to ask for an exception of benefits, she will keep this RN CM updated on the  outcome Discussed [name] contacted [facility] as well asking for new patient establishment and spoke with Cecille Rubin Sent secure message to PCP and embedded BSW with an update concerning attempts being made to get a benefit exception from [facility] in order to enroll Ms. Seese into their [facility] program  03/17/21 completed successful outbound call with son [name] Discussed planned discharge for patient on 03/18/21, son [name] will bring her home from [facility] Discussed patient has returned to baseline and appears to be stable Informed son [name] [facility] has approved 3 initial visits from [facility] at wich time, ongoing services will be determined by [facility] per recommendations from [place]  Provided [name] with the contact name/number for [facility] and encouraged him to call this provider in order to shedule  patients initial visit  [place] will also be providing in home [facility] and PT upon her initial dischrage home Discussed plans with patient for ongoing care management follow up and provided patient with direct contact information for care management team  Self Care Activities:  Continue to keep all MD follow up appointments  Continue to take all medications exactly as prescribed  Continue to call the pharmacy for refills at least 7 days before taking last dose Notify the CCM team and or PCP if you are unable to afford your medication refills Call the CCM team and or PCP for questions or concerns  Patient Goals: - continue to engage with embedded BSW for long term care planning  - complete Medicaid application as soon as possible  - consider [facility], [facility] and or [place]  - contact Neurologist to schedule a follow up concerning pharmacological recommendations for agitation   Follow Up Plan: Telephone follow up appointment with care management team member scheduled for: 03/25/21     Plan:Telephone follow up appointment with care management team member scheduled for:  03/25/21  Barb Merino, RN, BSN, CCM Care Management Coordinator [place] [facility]  Direct Phone: [phone]

## 2021-03-18 NOTE — Patient Instructions (Signed)
Visit Information   PATIENT GOALS/PLAN OF CARE:  Care Plan : Dementia (Adult)  Updates made by Lynne Logan, RN since 03/17/2021 12:00 AM     Problem: Cognitive Function   Priority: High     Long-Range Goal: Optimal Cognitive Function   Start Date: 07/26/2020  Expected End Date: 07/26/2021  Recent Progress: On track  Priority: High  Note:   Current Barriers:  Ineffective Self Health Maintenance Unable to self administer medications as prescribed Unable to perform ADLs independently Unable to perform IADLs independently Cognitive deficit  Clinical Goal(s):  Collaboration with [name], FNP regarding development and update of comprehensive plan of care as evidenced by provider attestation and co-signature Inter-disciplinary care team collaboration (see longitudinal plan of care) patient will work with care management team to address care coordination and chronic disease management needs related to Disease Management Educational Needs Care Coordination Medication Management and Education Psychosocial Support Dementia and Caregiver Support   Interventions:  02/21/21 completed successful outbound call with son [name]  Evaluation of current treatment plan related to Dementia and patient's adherence to plan as established by provider. Collaboration with [name], FNP regarding development and update of comprehensive plan of care as evidenced by provider attestation       and co-signature Inter-disciplinary care team collaboration (see longitudinal plan of care) Determined [name] is calling today to advise he received a home visit from a DSS worker this past Friday Determined [name] is aware the referral for APS was placed via this office/PCP Validated Mr. Virginia Crews feelings and provided active listening to his concerns regarding his mother's declining cognition and his attempts to care for her Discussed [name] plans to bring Ms. Koska in this morning for a urine  check to rule out UTI due increased agitation over the past several days Discussed stat [facility] order for SNV last week, discussed the [facility] agency contacted the PCP to advise this visit was refused Determined the [facility] agency contact Ms. Mariscal's granddaughter instead of [name], his niece is not involved with Ms. Meisinger's care but is listed as a secondary contact Determined patient's granddaughter did not schedule a nurse visit due to the family having 4 large dogs in the home and they did not want any safety concerns for the nurse Determined [name] has since removed the dogs from their home and feels it is now safe for home care, although he was instructed by DSS to bring patient in for a home visit for evaluation of UTI Collaborated with PCP [name], FNP to confirm patient may stop by the office for a urine and she is agreeable   Reinforced the importance for [name] to try to stay calm when redirecting patient and assisting her with ADL's/iADL's in consideration of her advanced dementia Further discussed and educated about resources such as [facility]ACE, [facility] and [facility] call to consider for establishment in order to ensure patient receives the care needed Determined [name] continues to consider having his mother placed into a long term nursing facility if approved and if he can locate a facility he feels will provide her with good care, he plans to complete the Medicaid application sent by embedded BSW, this weekend and will take it to DSS in person Provided [name] with the contact information for these providers and encouraged him to call each provider, taking notes on what services may be provided to help him determined which of these may be the best option for  Ms. Lamia  Determined [name] is interested in having his mother placed on Ativan for agitation Discussed and reviewed having [name] schedule a follow up with Neurology for best evaluation and determination of  further pharmacological recommendations such as Ativan to help with agitation Discussed plans with patient for ongoing care management follow up and provided patient with direct contact information for care management team 02/21/21 Case Collaboration  Determined Ms. Biber was unable to leave a urine sample while at PCP office today Collaborated with PCP and embedded [place] regarding long term care planning for Ms. [place] outbound call to [employer], spoke with Jenny Reichmann who advised [employer] is out of network, however this provider will contact [place] to ask for an exception of benefits, she will keep this RN CM updated on the outcome Discussed [name] contacted [facility] as well asking for new patient establishment and spoke with Cecille Rubin Sent secure message to PCP and embedded BSW with an update concerning attempts being made to get a benefit exception from [facility] in order to enroll Ms. Wied[place] into their [facility] program  03/17/21 completed successful outbound call with son [name] Discussed planned discharge for patient on 03/18/21, son [name] will bring her home from [place] Discussed patient has returned to baseline and appears to be stable Informed son [name] [facility] has approved 3 initial visits from [facility] at wich time, ongoing services will be determined by [facility] per recommendations from [place]  Provided [name] with the contact name/number for [facility] and encouraged him to call this provider in order to shedule patients initial visit  [place] will also be providing in home [facility] and PT upon her initial dischrage home Discussed plans with patient for ongoing care management follow up and provided patient with direct contact information for care management team  Self Care Activities:  Continue to keep all MD follow up appointments  Continue to take all medications exactly as prescribed  Continue to call the pharmacy for refills at least 7 days  before taking last dose Notify the CCM team and or PCP if you are unable to afford your medication refills Call the CCM team and or PCP for questions or concerns  Patient Goals: - continue to engage with embedded BSW for long term care planning  - complete Medicaid application as soon as possible  - consider [facility], [facility] and or [place]  - contact Neurologist to schedule a follow up concerning pharmacological recommendations for agitation   Follow Up Plan: Telephone follow up appointment with care management team member scheduled for: 03/25/21      Consent to CCM Services: Ms. Adan was given information about Chronic Care Management services including:  CCM service includes personalized support from designated clinical staff supervised by her physician, including individualized plan of care and coordination with other care providers 24/7 contact phone numbers for assistance for urgent and routine care needs. Service will only be billed when office clinical staff spend 20 minutes or more in a month to coordinate care. Only one practitioner may furnish and bill the service in a calendar month. The patient may stop CCM services at any time (effective at the end of the month) by phone call to the office staff. The patient will be responsible for cost sharing (co-pay) of up to 20% of the service fee (after annual deductible is met).  Patient agreed to services and verbal consent obtained.  The patient verbalized understanding of instructions, educational materials, and care plan provided today and declined offer to receive copy of patient instructions, educational materials, and care plan.   Telephone follow up appointment with care management team member scheduled for: 03/25/21  Barb Merino, RN, BSN, CCM Care Management Coordinator [place] [facility]  Direct Phone: [phone]

## 2021-03-20 DIAGNOSIS — K219 Gastro-esophageal reflux disease without esophagitis: Secondary | ICD-10-CM | POA: Diagnosis not present

## 2021-03-20 DIAGNOSIS — I509 Heart failure, unspecified: Secondary | ICD-10-CM | POA: Diagnosis not present

## 2021-03-20 DIAGNOSIS — G301 Alzheimer's disease with late onset: Secondary | ICD-10-CM | POA: Diagnosis not present

## 2021-03-20 DIAGNOSIS — Z7951 Long term (current) use of inhaled steroids: Secondary | ICD-10-CM | POA: Diagnosis not present

## 2021-03-20 DIAGNOSIS — F02811 Dementia in other diseases classified elsewhere, unspecified severity, with agitation: Secondary | ICD-10-CM | POA: Diagnosis not present

## 2021-03-20 DIAGNOSIS — E7800 Pure hypercholesterolemia, unspecified: Secondary | ICD-10-CM | POA: Diagnosis not present

## 2021-03-20 DIAGNOSIS — I2510 Atherosclerotic heart disease of native coronary artery without angina pectoris: Secondary | ICD-10-CM | POA: Diagnosis not present

## 2021-03-20 DIAGNOSIS — Z9181 History of falling: Secondary | ICD-10-CM | POA: Diagnosis not present

## 2021-03-20 DIAGNOSIS — J449 Chronic obstructive pulmonary disease, unspecified: Secondary | ICD-10-CM | POA: Diagnosis not present

## 2021-03-20 DIAGNOSIS — I110 Hypertensive heart disease with heart failure: Secondary | ICD-10-CM | POA: Diagnosis not present

## 2021-03-20 DIAGNOSIS — I252 Old myocardial infarction: Secondary | ICD-10-CM | POA: Diagnosis not present

## 2021-03-20 DIAGNOSIS — N183 Chronic kidney disease, stage 3 unspecified: Secondary | ICD-10-CM | POA: Diagnosis not present

## 2021-03-20 DIAGNOSIS — Z79891 Long term (current) use of opiate analgesic: Secondary | ICD-10-CM | POA: Diagnosis not present

## 2021-03-21 DIAGNOSIS — Z85118 Personal history of other malignant neoplasm of bronchus and lung: Secondary | ICD-10-CM | POA: Diagnosis not present

## 2021-03-21 DIAGNOSIS — R54 Age-related physical debility: Secondary | ICD-10-CM | POA: Diagnosis not present

## 2021-03-21 DIAGNOSIS — Z96653 Presence of artificial knee joint, bilateral: Secondary | ICD-10-CM | POA: Diagnosis not present

## 2021-03-21 DIAGNOSIS — R519 Headache, unspecified: Secondary | ICD-10-CM | POA: Diagnosis not present

## 2021-03-21 DIAGNOSIS — R2681 Unsteadiness on feet: Secondary | ICD-10-CM | POA: Diagnosis not present

## 2021-03-21 DIAGNOSIS — G301 Alzheimer's disease with late onset: Secondary | ICD-10-CM | POA: Diagnosis not present

## 2021-03-21 DIAGNOSIS — I5032 Chronic diastolic (congestive) heart failure: Secondary | ICD-10-CM | POA: Diagnosis not present

## 2021-03-21 DIAGNOSIS — J441 Chronic obstructive pulmonary disease with (acute) exacerbation: Secondary | ICD-10-CM | POA: Diagnosis not present

## 2021-03-21 DIAGNOSIS — Z87891 Personal history of nicotine dependence: Secondary | ICD-10-CM | POA: Diagnosis not present

## 2021-03-22 ENCOUNTER — Ambulatory Visit: Payer: Medicare Other | Admitting: Nurse Practitioner

## 2021-03-22 ENCOUNTER — Ambulatory Visit: Payer: Medicare Other

## 2021-03-23 DIAGNOSIS — N183 Chronic kidney disease, stage 3 unspecified: Secondary | ICD-10-CM | POA: Diagnosis not present

## 2021-03-23 DIAGNOSIS — J449 Chronic obstructive pulmonary disease, unspecified: Secondary | ICD-10-CM | POA: Diagnosis not present

## 2021-03-23 DIAGNOSIS — I110 Hypertensive heart disease with heart failure: Secondary | ICD-10-CM | POA: Diagnosis not present

## 2021-03-23 DIAGNOSIS — F02811 Dementia in other diseases classified elsewhere, unspecified severity, with agitation: Secondary | ICD-10-CM | POA: Diagnosis not present

## 2021-03-23 DIAGNOSIS — E7800 Pure hypercholesterolemia, unspecified: Secondary | ICD-10-CM | POA: Diagnosis not present

## 2021-03-23 DIAGNOSIS — I2510 Atherosclerotic heart disease of native coronary artery without angina pectoris: Secondary | ICD-10-CM | POA: Diagnosis not present

## 2021-03-23 DIAGNOSIS — G301 Alzheimer's disease with late onset: Secondary | ICD-10-CM | POA: Diagnosis not present

## 2021-03-23 DIAGNOSIS — I509 Heart failure, unspecified: Secondary | ICD-10-CM | POA: Diagnosis not present

## 2021-03-23 DIAGNOSIS — K219 Gastro-esophageal reflux disease without esophagitis: Secondary | ICD-10-CM | POA: Diagnosis not present

## 2021-03-23 DIAGNOSIS — Z79891 Long term (current) use of opiate analgesic: Secondary | ICD-10-CM | POA: Diagnosis not present

## 2021-03-23 DIAGNOSIS — I252 Old myocardial infarction: Secondary | ICD-10-CM | POA: Diagnosis not present

## 2021-03-23 DIAGNOSIS — Z7951 Long term (current) use of inhaled steroids: Secondary | ICD-10-CM | POA: Diagnosis not present

## 2021-03-23 DIAGNOSIS — Z9181 History of falling: Secondary | ICD-10-CM | POA: Diagnosis not present

## 2021-03-24 ENCOUNTER — Telehealth: Payer: Medicare Other

## 2021-03-24 ENCOUNTER — Telehealth: Payer: Self-pay

## 2021-03-24 DIAGNOSIS — I110 Hypertensive heart disease with heart failure: Secondary | ICD-10-CM | POA: Diagnosis not present

## 2021-03-24 DIAGNOSIS — I2510 Atherosclerotic heart disease of native coronary artery without angina pectoris: Secondary | ICD-10-CM | POA: Diagnosis not present

## 2021-03-24 DIAGNOSIS — I509 Heart failure, unspecified: Secondary | ICD-10-CM | POA: Diagnosis not present

## 2021-03-24 DIAGNOSIS — E7800 Pure hypercholesterolemia, unspecified: Secondary | ICD-10-CM | POA: Diagnosis not present

## 2021-03-24 DIAGNOSIS — Z9181 History of falling: Secondary | ICD-10-CM | POA: Diagnosis not present

## 2021-03-24 DIAGNOSIS — Z79891 Long term (current) use of opiate analgesic: Secondary | ICD-10-CM | POA: Diagnosis not present

## 2021-03-24 DIAGNOSIS — J449 Chronic obstructive pulmonary disease, unspecified: Secondary | ICD-10-CM | POA: Diagnosis not present

## 2021-03-24 DIAGNOSIS — Z7951 Long term (current) use of inhaled steroids: Secondary | ICD-10-CM | POA: Diagnosis not present

## 2021-03-24 DIAGNOSIS — I252 Old myocardial infarction: Secondary | ICD-10-CM | POA: Diagnosis not present

## 2021-03-24 DIAGNOSIS — F02811 Dementia in other diseases classified elsewhere, unspecified severity, with agitation: Secondary | ICD-10-CM | POA: Diagnosis not present

## 2021-03-24 DIAGNOSIS — N183 Chronic kidney disease, stage 3 unspecified: Secondary | ICD-10-CM | POA: Diagnosis not present

## 2021-03-24 DIAGNOSIS — G301 Alzheimer's disease with late onset: Secondary | ICD-10-CM | POA: Diagnosis not present

## 2021-03-24 DIAGNOSIS — K219 Gastro-esophageal reflux disease without esophagitis: Secondary | ICD-10-CM | POA: Diagnosis not present

## 2021-03-24 NOTE — Chronic Care Management (AMB) (Signed)
Chronic Care Management Pharmacy Assistant   Name: Sara Fox  MRN: [id number] DOB: [birth year]   Reason for Encounter: Disease State/ Hypertension  Recent office visits:  02-09-2021 [name] (CCM)  02-14-2021 Little, Claudette Stapler, RN (CCM)  02-14-2021 [name] (CCM)  02-16-2021 [name] (CCM)  02-17-2021 [name] (CCM)  02-21-2021 Little, Claudette Stapler, RN (CCM)  02-23-2021 [name] (CCM)  02-28-2021 [name] (CCM)  03-01-2021 Little, Claudette Stapler, RN (CCM)  03-02-2021 [name] (CCM)  03-03-2021 Little, Claudette Stapler, RN (CCM)  03-07-2021 Little, Claudette Stapler, RN (CCM)  03-07-2021 [name] (CCM)  03-08-2021 [name] (CCM)  03-10-2021 [name] (CCM)  03-17-2021  Little, Claudette Stapler, RN (CCM)  Recent consult visits:  None  Hospital visits:  Medication Reconciliation was completed by comparing discharge summary, patient's EMR and Pharmacy list, and upon discussion with patient.  Admitted to the hospital on 02-26-2021 due to Gait disturbance. Discharge date was 03-05-2021. Discharged from [facility]?Medications Started at [facility] Discharge:?? None  Medication Changes at Hospital Discharge: None  Medications Discontinued at Hospital Discharge: Ibuprofen   Medications that remain the same after Hospital Discharge:??  -All other medications will remain the same.    Medications: Outpatient Encounter Medications as of 03/24/2021  Medication Sig   aspirin EC 81 MG tablet Take 81 mg by mouth daily as needed (headache).   budesonide-formoterol (SYMBICORT) 160-4.5 MCG/ACT inhaler TAKE 2 PUFFS BY MOUTH TWICE A DAY (Patient taking differently: Inhale 2 puffs into the lungs in the morning and at bedtime.)   Calcium Citrate-Vitamin D (CALCIUM CITRATE+D3 PETITES PO) Take 1 tablet by mouth daily.   cholecalciferol (VITAMIN D) 25 MCG (1000 UNIT) tablet Take 1,000 Units by mouth daily.   ciclopirox (PENLAC) 8 %  solution Apply 1 application topically at bedtime. Apply over nail and surrounding skin. Apply daily over previous coat. After seven (7) days, file nail and continue cycle.   [facility] MAGNESIUM OXIDE 250 MG TABS TAKE 1 TABLET BY MOUTH WITH EVENING MEAL DAILY (Patient taking differently: Take 250 mg by mouth every evening.)   donepezil (ARICEPT) 10 MG tablet Take 1 tablet (10 mg total) by mouth at bedtime.   ferrous sulfate 325 (65 FE) MG tablet Take 325 mg by mouth daily with breakfast.   furosemide (LASIX) 20 MG tablet Take 1 tablet by mouth Mon - Fri daily (Patient taking differently: Take 20 mg by mouth See admin instructions. Monday-friday)   gabapentin (NEURONTIN) 100 MG capsule Take 1 capsule (100 mg total) by mouth 2 (two) times daily.   memantine (NAMENDA) 5 MG tablet Take 1 tablet (5 mg total) by mouth daily.   metoprolol tartrate (LOPRESSOR) 25 MG tablet Take 1 tablet (25 mg total) by mouth 2 (two) times daily.   Multiple Vitamin (MULTIVITAMIN WITH MINERALS) TABS tablet Take 1 tablet by mouth daily.   nitroGLYCERIN (NITROSTAT) 0.4 MG SL tablet PLACE ONE TABLET UNDER THE TONGUE EVERY 5 MINUTES AS NEEDED FOR CHEST PAIN. (Patient taking differently: Place 0.4 mg under the tongue every 5 (five) minutes as needed for chest pain.)   Omega-3 Fatty Acids (FISH OIL PO) Take 1 capsule by mouth daily.   oxybutynin (DITROPAN) 5 MG tablet Take 0.5 tablets (2.5 mg total) by mouth 2 (two) times daily.   pantoprazole (PROTONIX) 40 MG tablet Take 1 tablet (40 mg total) by mouth 2 (two) times daily.   polyethylene glycol (MIRALAX / GLYCOLAX) packet Take 17 g by mouth  daily as needed for mild constipation.   Potassium Chloride ER 20 MEQ TBCR Take 2 tablets by mouth every morning. (Patient taking differently: Take 40 mEq by mouth every morning.)   QUEtiapine (SEROQUEL) 50 MG tablet TAKE 1 TABLET BY MOUTH EVERYDAY AT BEDTIME (Patient taking differently: Take 50 mg by mouth at bedtime.)   rosuvastatin (CRESTOR) 40 MG  tablet Take 1 tablet (40 mg total) by mouth daily. (Patient taking differently: Take 40 mg by mouth at bedtime.)   traMADol (ULTRAM) 50 MG tablet TAKE 1 TABLET 3 TIMES A DAY AND CAN TAKE 1 EXTRA 20 DAYS/MONTH (Patient taking differently: Take 100 mg by mouth 2 (two) times daily.)   No facility-administered encounter medications on file as of 03/24/2021.  Reviewed chart prior to disease state call. Spoke with patient regarding BP  Recent Office Vitals: BP Readings from Last 3 Encounters:  03/05/21 106/62  02/02/21 130/72  12/15/20 100/60   Pulse Readings from Last 3 Encounters:  03/05/21 74  02/02/21 76  12/15/20 91    Wt Readings from Last 3 Encounters:  03/03/21 138 lb 10.7 oz (62.9 kg)  02/02/21 139 lb 3.2 oz (63.1 kg)  01/06/21 129 lb (58.5 kg)     Kidney Function Lab Results  Component Value Date/Time   CREATININE 1.07 (H) 03/01/2021 09:07 PM   CREATININE 0.98 03/01/2021 04:27 AM   CREATININE 1.72 (H) 10/28/2020 10:02 AM   CREATININE 1.42 (H) 06/08/2020 01:36 PM   GFR 35.79 (L) 05/21/2020 10:07 AM   GFRNONAA 52 (L) 03/01/2021 09:07 PM   GFRNONAA 30 (L) 10/28/2020 10:02 AM   GFRAA 46 (L) 04/22/2020 09:57 AM    BMP Latest Ref Rng & Units 03/01/2021 03/01/2021 03/01/2021  Glucose 70 - 99 mg/dL 104(H) - 95  BUN 8 - 23 mg/dL 14 - 14  Creatinine 0.44 - 1.00 mg/dL 1.07(H) - 0.98  BUN/Creat Ratio 12 - 28 - - -  Sodium 135 - 145 mmol/L 135 - 139  Potassium 3.5 - 5.1 mmol/L 4.0 3.9 3.5  Chloride 98 - 111 mmol/L 107 - 108  CO2 22 - 32 mmol/L 23 - 25  Calcium 8.9 - 10.3 mg/dL 8.5(L) - 8.8(L)    Current antihypertensive regimen:  Metoprolol 25 mg twice daily  How often are you checking your Blood Pressure? daily  Current home BP readings: 123/93, 115/62  What recent interventions/DTPs have been made by any provider to improve Blood Pressure control since last CPP Visit:  Educated on BP goals and benefits of medications for prevention of heart attack, stroke and kidney  damage; Daily salt intake goal < 2300 mg; -Counseled to monitor BP at home at least twice per week, document, and provide log at future appointments  Any recent hospitalizations or ED visits since last visit with CPP? Yes  What diet changes have been made to improve Blood Pressure Control?  Patient's son stated patient eats plenty of vegetables/fruits and drinks water.  What exercise is being done to improve your Blood Pressure Control?  Patient's son states patient walks around with walker.  Adherence Review: Is the patient currently on ACE/ARB medication? No Does the patient have >5 day gap between last estimated fill dates? No  NOTES: Patient's son stated patient is with remote health through her insurance and doesn't wish to make a follow up appointment with Orlando Penner. Patient's son was a little irritable so no other questions were asked. Informed Sara Fox PTM and we will contact again next month to verify if  patient will continue with CCM services.  Care Gaps: PNA Vac overdue Shingrix overdue 3rd covid vaccine overdue last completed 10-13-2019 Flu vaccine overdue AWV 01-25-2022  Star Rating Drugs: Rosuvastatin 40 mg- Last filled 03-19-2021 [facility]  [place] Clinical Pharmacist Assistant [phone]

## 2021-03-25 ENCOUNTER — Telehealth: Payer: Medicare Other

## 2021-03-25 ENCOUNTER — Ambulatory Visit: Payer: Self-pay

## 2021-03-25 ENCOUNTER — Telehealth: Payer: Self-pay

## 2021-03-25 DIAGNOSIS — I5032 Chronic diastolic (congestive) heart failure: Secondary | ICD-10-CM

## 2021-03-25 DIAGNOSIS — Z7951 Long term (current) use of inhaled steroids: Secondary | ICD-10-CM | POA: Diagnosis not present

## 2021-03-25 DIAGNOSIS — N183 Chronic kidney disease, stage 3 unspecified: Secondary | ICD-10-CM | POA: Diagnosis not present

## 2021-03-25 DIAGNOSIS — I110 Hypertensive heart disease with heart failure: Secondary | ICD-10-CM | POA: Diagnosis not present

## 2021-03-25 DIAGNOSIS — F02811 Dementia in other diseases classified elsewhere, unspecified severity, with agitation: Secondary | ICD-10-CM | POA: Diagnosis not present

## 2021-03-25 DIAGNOSIS — I2510 Atherosclerotic heart disease of native coronary artery without angina pectoris: Secondary | ICD-10-CM | POA: Diagnosis not present

## 2021-03-25 DIAGNOSIS — K219 Gastro-esophageal reflux disease without esophagitis: Secondary | ICD-10-CM | POA: Diagnosis not present

## 2021-03-25 DIAGNOSIS — N1831 Chronic kidney disease, stage 3a: Secondary | ICD-10-CM

## 2021-03-25 DIAGNOSIS — I509 Heart failure, unspecified: Secondary | ICD-10-CM | POA: Diagnosis not present

## 2021-03-25 DIAGNOSIS — I252 Old myocardial infarction: Secondary | ICD-10-CM | POA: Diagnosis not present

## 2021-03-25 DIAGNOSIS — I10 Essential (primary) hypertension: Secondary | ICD-10-CM

## 2021-03-25 DIAGNOSIS — G301 Alzheimer's disease with late onset: Secondary | ICD-10-CM | POA: Diagnosis not present

## 2021-03-25 DIAGNOSIS — Z79891 Long term (current) use of opiate analgesic: Secondary | ICD-10-CM | POA: Diagnosis not present

## 2021-03-25 DIAGNOSIS — I25118 Atherosclerotic heart disease of native coronary artery with other forms of angina pectoris: Secondary | ICD-10-CM

## 2021-03-25 DIAGNOSIS — J449 Chronic obstructive pulmonary disease, unspecified: Secondary | ICD-10-CM | POA: Diagnosis not present

## 2021-03-25 DIAGNOSIS — E7800 Pure hypercholesterolemia, unspecified: Secondary | ICD-10-CM | POA: Diagnosis not present

## 2021-03-25 DIAGNOSIS — C3491 Malignant neoplasm of unspecified part of right bronchus or lung: Secondary | ICD-10-CM

## 2021-03-25 DIAGNOSIS — Z9181 History of falling: Secondary | ICD-10-CM | POA: Diagnosis not present

## 2021-03-25 NOTE — Telephone Encounter (Signed)
  Care Management   Follow Up Note   03/25/2021 Name: Sara Fox MRN: [id number] DOB: [birth year]   Referred by: [name], FNP Reason for referral : Chronic Care Management (RN CM Follow up call )   An unsuccessful telephone outreach was attempted today. The patient was referred to the case management team for assistance with care management and care coordination.   Follow Up Plan: A HIPPA compliant phone message was left for the patient providing contact information and requesting a return call.   Barb Merino, RN, BSN, CCM Care Management Coordinator [place] [facility]  Direct Phone: [phone]

## 2021-03-28 NOTE — Chronic Care Management (AMB) (Signed)
Chronic Care Management   CCM RN Visit Note  03/25/2021 Name: Sara Fox MRN: [id number] DOB: Jul 01, 1940  Subjective: Sara Fox is a 80 y.o. year old female who is a primary care patient of Sara Fox, [name]. The care management team was consulted for assistance with disease management and care coordination needs.    Engaged with patient by telephone for initial visit in response to provider referral for case management and/or care coordination services.   Consent to Services:  The patient was given information about Chronic Care Management services, agreed to services, and gave verbal consent prior to initiation of services.  Please see initial visit note for detailed documentation.   Patient agreed to services and verbal consent obtained.   Assessment: Review of patient past medical history, allergies, medications, health status, including review of consultants reports, laboratory and other test data, was performed as part of comprehensive evaluation and provision of chronic care management services.   SDOH (Social Determinants of Health) assessments and interventions performed:    CCM Care Plan  Allergies  Allergen Reactions   Buprenorphine Hcl Shortness Of Breath    Throat swelling/trouble breathing and lethargic   Morphine And Related Shortness Of Breath    Throat swelling/trouble breathing and lethargic   Celebrex [Celecoxib] Diarrhea and Swelling    Swelling of legs    Vioxx [Rofecoxib] Palpitations   Codeine Other (See Comments)    Bloated     Outpatient Encounter Medications as of 03/25/2021  Medication Sig   aspirin EC 81 MG tablet Take 81 mg by mouth daily as needed (headache).   budesonide-formoterol (SYMBICORT) 160-4.5 MCG/ACT inhaler TAKE 2 PUFFS BY MOUTH TWICE A DAY (Patient taking differently: Inhale 2 puffs into the lungs in the morning and at bedtime.)   Calcium Citrate-Vitamin D (CALCIUM CITRATE+D3 PETITES PO) Take 1 tablet by mouth daily.    cholecalciferol (VITAMIN D) 25 MCG (1000 UNIT) tablet Take 1,000 Units by mouth daily.   ciclopirox (PENLAC) 8 % solution Apply 1 application topically at bedtime. Apply over nail and surrounding skin. Apply daily over previous coat. After seven (7) days, file nail and continue cycle.   [facility] MAGNESIUM OXIDE 250 MG TABS TAKE 1 TABLET BY MOUTH WITH EVENING MEAL DAILY (Patient taking differently: Take 250 mg by mouth every evening.)   donepezil (ARICEPT) 10 MG tablet Take 1 tablet (10 mg total) by mouth at bedtime.   ferrous sulfate 325 (65 FE) MG tablet Take 325 mg by mouth daily with breakfast.   furosemide (LASIX) 20 MG tablet Take 1 tablet by mouth Mon - Fri daily (Patient taking differently: Take 20 mg by mouth See admin instructions. Monday-friday)   gabapentin (NEURONTIN) 100 MG capsule Take 1 capsule (100 mg total) by mouth 2 (two) times daily.   memantine (NAMENDA) 5 MG tablet Take 1 tablet (5 mg total) by mouth daily.   metoprolol tartrate (LOPRESSOR) 25 MG tablet Take 1 tablet (25 mg total) by mouth 2 (two) times daily.   Multiple Vitamin (MULTIVITAMIN WITH MINERALS) TABS tablet Take 1 tablet by mouth daily.   nitroGLYCERIN (NITROSTAT) 0.4 MG SL tablet PLACE ONE TABLET UNDER THE TONGUE EVERY 5 MINUTES AS NEEDED FOR CHEST PAIN. (Patient taking differently: Place 0.4 mg under the tongue every 5 (five) minutes as needed for chest pain.)   Omega-3 Fatty Acids (FISH OIL PO) Take 1 capsule by mouth daily.   oxybutynin (DITROPAN) 5 MG tablet Take 0.5 tablets (2.5 mg total) by mouth 2 (two) times  daily.   pantoprazole (PROTONIX) 40 MG tablet Take 1 tablet (40 mg total) by mouth 2 (two) times daily.   polyethylene glycol (MIRALAX / GLYCOLAX) packet Take 17 g by mouth daily as needed for mild constipation.   Potassium Chloride ER 20 MEQ TBCR Take 2 tablets by mouth every morning. (Patient taking differently: Take 40 mEq by mouth every morning.)   QUEtiapine (SEROQUEL) 50 MG tablet TAKE 1 TABLET BY MOUTH  EVERYDAY AT BEDTIME (Patient taking differently: Take 50 mg by mouth at bedtime.)   rosuvastatin (CRESTOR) 40 MG tablet Take 1 tablet (40 mg total) by mouth daily. (Patient taking differently: Take 40 mg by mouth at bedtime.)   traMADol (ULTRAM) 50 MG tablet TAKE 1 TABLET 3 TIMES A DAY AND CAN TAKE 1 EXTRA 20 DAYS/MONTH (Patient taking differently: Take 100 mg by mouth 2 (two) times daily.)   No facility-administered encounter medications on file as of 03/25/2021.    Patient Active Problem List   Diagnosis Date Noted   Gait disturbance 02/27/2021   GERD (gastroesophageal reflux disease) 02/27/2021   Ambulatory dysfunction 02/27/2021   Transaminitis 02/27/2021   Fall (on)(from) incline, initial encounter 11/23/2020   Macrocytosis without anemia 11/22/2020   Unwitnessed fall 11/22/2020   Late onset Alzheimer's dementia with behavioral disturbance ([name]) 11/18/2020   Visual hallucination 11/18/2020   Sundowning 11/18/2020   Non-small cell carcinoma of right lung, stage 1 ([name]) 06/08/2020   Pulmonary nodule 1 cm or greater in diameter 05/21/2020   Atherosclerotic heart disease of native coronary artery with other forms of angina pectoris ([place]) 04/22/2020   Memory loss 02/18/2019   Vitamin B12 deficiency 10/23/2018   Stage 3 chronic kidney disease ([place]) 06/03/2018   Chronic anemia 06/03/2018   Hypercholesterolemia 06/03/2018   Malnutrition of moderate degree 07/12/2017   Hypoalbuminemia 07/11/2017   Elevated LFTs 07/11/2017   Snoring 12/21/2016   Peripheral musculoskeletal gait disorder 02/05/2015   Lumbar post-laminectomy syndrome 02/05/2015   CAD in native artery 10/16/2014   NSTEMI (non-ST elevated myocardial infarction) ([place]) 09/13/2014   Cardiomyopathy, ischemic 16/11/3708   Chronic systolic heart failure ([place])    Tobacco abuse 09/12/2014   Dyslipidemia 09/12/2014   Normocytic anemia 09/12/2014   Chronic diastolic heart failure, NYHA class 1 ([place]) 09/12/2014   Postlaminectomy  syndrome, cervical region 08/01/2013   Chronic midline low back pain with left-sided sciatica 08/01/2013   Shoulder joint contracture 08/01/2013   HTN (hypertension) 03/03/2013   Abnormal genetic test 03/03/2013    Conditions to be addressed/monitored: 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, Stage 3a chronic kidney disease, HTN, chronic systolic heart failure, Mild Cognitive Impairment, Atherosclerotic heart disease of native coronary artery with other forms of angina pectoris, Lung Nodule  Care Plan : Dementia (Adult)  Updates made by Lynne Logan, RN since 03/25/2021 12:00 AM     Problem: Cognitive Function   Priority: High     Long-Range Goal: Optimal Cognitive Function   Start Date: 07/26/2020  Expected End Date: 07/26/2021  Recent Progress: On track  Priority: High  Note:   Current Barriers:  Ineffective Self Health Maintenance Unable to self administer medications as prescribed Unable to perform ADLs independently Unable to perform IADLs independently Cognitive deficit  Clinical Goal(s):  Collaboration with Sara Brine, FNP regarding development and update of comprehensive plan of care as evidenced by provider attestation and co-signature Inter-disciplinary care team collaboration (see longitudinal plan of care) patient will work with care management team to address care coordination and chronic disease  management needs related to Disease Management Educational Needs Care Coordination Medication Management and Education Psychosocial Support Dementia and Caregiver Support   Interventions:  02/21/21 completed successful outbound call with son [name]  Evaluation of current treatment plan related to Dementia and patient's adherence to plan as established by provider. Collaboration with Sara Brine, FNP regarding development and update of comprehensive plan of care as evidenced by provider attestation       and co-signature Inter-disciplinary care  team collaboration (see longitudinal plan of care) Determined [name] is calling today to advise he received a home visit from a DSS worker this past Friday Determined [name] is aware the referral for APS was placed via this office/PCP Validated Mr. Virginia Crews feelings and provided active listening to his concerns regarding his mother's declining cognition and his attempts to care for her Discussed [name] plans to bring Ms. Siegenthaler in this morning for a urine check to rule out UTI due increased agitation over the past several days Discussed stat [facility] order for SNV last week, discussed the [facility] agency contacted the PCP to advise this visit was refused Determined the [facility] agency contact Ms. Schupp's granddaughter instead of [name], his niece is not involved with Ms. Morganti's care but is listed as a secondary contact Determined patient's granddaughter did not schedule a nurse visit due to the family having 4 large dogs in the home and they did not want any safety concerns for the nurse Determined [name] has since removed the dogs from their home and feels it is now safe for home care, although he was instructed by DSS to bring patient in for a home visit for evaluation of UTI Collaborated with PCP Sara Brine, FNP to confirm patient may stop by the office for a urine and she is agreeable   Reinforced the importance for [name] to try to stay calm when redirecting patient and assisting her with ADL's/iADL's in consideration of her advanced dementia Further discussed and educated about resources such as PACE, [facility] and [facility] call to consider for establishment in order to ensure patient receives the care needed Determined [name] continues to consider having his mother placed into a long term nursing facility if approved and if he can locate a facility he feels will provide her with good care, he plans to complete the Medicaid application sent by embedded BSW, this weekend and will  take it to DSS in person Provided [name] with the contact information for these providers and encouraged him to call each provider, taking notes on what services may be provided to help him determined which of these may be the best option for Ms. Moger  Determined [name] is interested in having his mother placed on Ativan for agitation Discussed and reviewed having [name] schedule a follow up with Neurology for best evaluation and determination of further pharmacological recommendations such as Ativan to help with agitation Discussed plans with patient for ongoing care management follow up and provided patient with direct contact information for care management team 02/21/21 Case Collaboration  Determined Ms. Sieh was unable to leave a urine sample while at PCP office today Collaborated with PCP and embedded [place] regarding long term care planning for Ms. [place] outbound call to [employer], spoke with Jenny Reichmann who advised [employer] is out of network, however this provider will contact [place] to ask for an exception of benefits, she will keep this RN CM updated on the outcome  Discussed [name] contacted [facility] as well asking for new patient establishment and spoke with Cecille Rubin Sent secure message to PCP and embedded BSW with an update concerning attempts being made to get a benefit exception from [facility] in order to enroll Ms. [place] into their [facility] program  03/17/21 completed successful outbound call with son [name] Discussed planned discharge for patient on 03/18/21, son [name] will bring her home from [facility] Discussed patient has returned to baseline and appears to be stable Informed son [name] [facility] has approved 3 initial visits from [facility] at wich time, ongoing services will be determined by [facility] per recommendations from [place]  Provided [name] with the contact name/number for [facility] and encouraged him to call this provider in order to shedule  patients initial visit  [place] will also be providing in home [facility] and PT upon her initial dischrage home Discussed plans with patient for ongoing care management follow up and provided patient with direct contact information for care management team 03/25/21 completed successful call with son [name]  Determined patient is home and doing well per her son [name] Discussed the initial visit with [facility] has been completed and son states this was a successful visit  Discussed the plan for future visits includes [facility] requesting to take over patient's medical management long term  Determined this RN CM will follow up with son [name] on around 04/15/21 for an update regarding this transition Sent in basket message with patient status update to PCP and embedded BSW  Discussed plans with patient for ongoing care management follow up and provided patient with direct contact information for care management team  Self Care Activities:  Continue to keep all MD follow up appointments  Continue to take all medications exactly as prescribed  Continue to call the pharmacy for refills at least 7 days before taking last dose Notify the CCM team and or PCP if you are unable to afford your medication refills Call the CCM team and or PCP for questions or concerns  Patient Goals: - continue to engage with embedded BSW for long term care planning  - complete Medicaid application as soon as possible  - consider [facility], [facility] and or [place]  - contact Neurologist to schedule a follow up concerning pharmacological recommendations for agitation   Follow Up Plan: Telephone follow up appointment with care management team member scheduled for: 04/15/21     Plan:Telephone follow up appointment with care management team member scheduled for:  04/15/21  Barb Merino, RN, BSN, CCM Care Management Coordinator [place] [facility]  Direct Phone: [phone]

## 2021-03-28 NOTE — Patient Instructions (Signed)
Visit Information   PATIENT GOALS/PLAN OF CARE:  Care Plan : Dementia (Adult)  Updates made by Lynne Logan, RN since 03/25/2021 12:00 AM     Problem: Cognitive Function   Priority: High     Long-Range Goal: Optimal Cognitive Function   Start Date: 07/26/2020  Expected End Date: 07/26/2021  Recent Progress: On track  Priority: High  Note:   Current Barriers:  Ineffective Self Health Maintenance Unable to self administer medications as prescribed Unable to perform ADLs independently Unable to perform IADLs independently Cognitive deficit  Clinical Goal(s):  Collaboration with [name], FNP regarding development and update of comprehensive plan of care as evidenced by provider attestation and co-signature Inter-disciplinary care team collaboration (see longitudinal plan of care) patient will work with care management team to address care coordination and chronic disease management needs related to Disease Management Educational Needs Care Coordination Medication Management and Education Psychosocial Support Dementia and Caregiver Support   Interventions:  02/21/21 completed successful outbound call with son Sara Fox  Evaluation of current treatment plan related to Dementia and patient's adherence to plan as established by provider. Collaboration with [name], FNP regarding development and update of comprehensive plan of care as evidenced by provider attestation       and co-signature Inter-disciplinary care team collaboration (see longitudinal plan of care) Determined [name] is calling today to advise he received a home visit from a DSS worker this past Friday Determined [name] is aware the referral for APS was placed via this office/PCP Validated Mr. Virginia Crews feelings and provided active listening to his concerns regarding his mother's declining cognition and his attempts to care for her Discussed [name] plans to bring Sara Fox in this morning for a urine  check to rule out UTI due increased agitation over the past several days Discussed stat [facility] order for SNV last week, discussed the [facility] agency contacted the PCP to advise this visit was refused Determined the [facility] agency contact Sara Fox's granddaughter instead of [name], his niece is not involved with Sara Fox's care but is listed as a secondary contact Determined patient's granddaughter did not schedule a nurse visit due to the family having 4 large dogs in the home and they did not want any safety concerns for the nurse Determined [name] has since removed the dogs from their home and feels it is now safe for home care, although he was instructed by DSS to bring patient in for a home visit for evaluation of UTI Collaborated with PCP [name], FNP to confirm patient may stop by the office for a urine and she is agreeable   Reinforced the importance for [name] to try to stay calm when redirecting patient and assisting her with ADL's/iADL's in consideration of her advanced dementia Further discussed and educated about resources such as [facility], [facility] and [facility] to consider for establishment in order to ensure patient receives the care needed Determined [name] continues to consider having his mother placed into a long term nursing facility if approved and if he can locate a facility he feels will provide her with good care, he plans to complete the Medicaid application sent by embedded [facility], this weekend and will take it to DSS in person Provided [name] with the contact information for these providers and encouraged him to call each provider, taking notes on what services may be provided to help him determined which of these may be the best option for  Sara Fox  Determined [name] is interested in having his mother placed on Ativan for agitation Discussed and reviewed having [name] schedule a follow up with Neurology for best evaluation and determination of  further pharmacological recommendations such as Ativan to help with agitation Discussed plans with patient for ongoing care management follow up and provided patient with direct contact information for care management team 02/21/21 Case Collaboration  Determined Sara Fox was unable to leave a urine sample while at PCP office today Collaborated with PCP and embedded [place] regarding long term care planning for Sara Fox outbound call to [employer], spoke with Sara Fox who advised [employer] is out of network, however this provider will contact [place] to ask for an exception of benefits, she will keep this RN CM updated on the outcome Discussed [name] contacted [facility] as well asking for new patient establishment and spoke with Sara Fox Sent secure message to PCP and embedded BSW with an update concerning attempts being made to get a benefit exception from [facility] in order to enroll Ms. Rupnow into their [facility] program  03/17/21 completed successful outbound call with son Sara Fox Discussed planned discharge for patient on 03/18/21, son Sara Fox will bring her home from [place] Discussed patient has returned to baseline and appears to be stable Informed son Sara Fox [facility] has approved 3 initial visits from [facility] at wich time, ongoing services will be determined by Sara Fox per recommendations from [place]  Provided Sara Fox with the contact name/number for [facility] and encouraged him to call this provider in order to shedule patients initial visit  [place] will also be providing in home [facility] PT upon her initial dischrage home Discussed plans with patient for ongoing care management follow up and provided patient with direct contact information for care management team 03/25/21 completed successful call with son Sara Fox  Determined patient is home and doing well per her son Sara Fox Discussed the initial visit with [facility] has been completed and son  states this was a successful visit  Discussed the plan for future visits includes [facility] requesting to take over patient's medical management long term  Determined this RN CM will follow up with son Sara Fox on around 04/15/21 for an update regarding this transition Sent in basket message with patient status update to PCP and embedded B[facility]  Discussed plans with patient for ongoing care management follow up and provided patient with direct contact information for care management team  Self Care Activities:  Continue to keep all MD follow up appointments  Continue to take all medications exactly as prescribed  Continue to call the pharmacy for refills at least 7 days before taking last dose Notify the CCM team and or PCP if you are unable to afford your medication refills Call the CCM team and or PCP for questions or concerns  Patient Goals: - continue to engage with embedded B[facility] for long term care planning  - complete Medicaid application as soon as possible  - consider [facility], [facility] and or Sara Fox  - contact Neurologist to schedule a follow up concerning pharmacological recommendations for agitation   Follow Up Plan: Telephone follow up appointment with care management team member scheduled for: 04/15/21      Consent to CCM Services: Sara Fox was given information about Chronic Care Management services including:  CCM service includes personalized support from designated clinical staff supervised by her physician, including  individualized plan of care and coordination with other care providers 24/7 contact phone numbers for assistance for urgent and routine care needs. Service will only be billed when office clinical staff spend 20 minutes or more in a month to coordinate care. Only one practitioner may furnish and bill the service in a calendar month. The patient may stop CCM services at any time (effective at the end of the month) by phone call  to the office staff. The patient will be responsible for cost sharing (co-pay) of up to 20% of the service fee (after annual deductible is met).  Patient agreed to services and verbal consent obtained.   The patient verbalized understanding of instructions, educational materials, and care plan provided today and declined offer to receive copy of patient instructions, educational materials, and care plan.   Telephone follow up appointment with care management team member scheduled for: 04/15/21  Barb Merino, RN, BSN, CCM Care Management Coordinator [place] [facility]  Direct Phone: [phone]

## 2021-03-29 ENCOUNTER — Telehealth: Payer: Self-pay | Admitting: Nurse Practitioner

## 2021-03-29 ENCOUNTER — Telehealth: Payer: Self-pay

## 2021-03-29 DIAGNOSIS — J449 Chronic obstructive pulmonary disease, unspecified: Secondary | ICD-10-CM | POA: Diagnosis not present

## 2021-03-29 DIAGNOSIS — I509 Heart failure, unspecified: Secondary | ICD-10-CM | POA: Diagnosis not present

## 2021-03-29 DIAGNOSIS — I252 Old myocardial infarction: Secondary | ICD-10-CM | POA: Diagnosis not present

## 2021-03-29 DIAGNOSIS — Z79891 Long term (current) use of opiate analgesic: Secondary | ICD-10-CM | POA: Diagnosis not present

## 2021-03-29 DIAGNOSIS — F02811 Dementia in other diseases classified elsewhere, unspecified severity, with agitation: Secondary | ICD-10-CM | POA: Diagnosis not present

## 2021-03-29 DIAGNOSIS — E7800 Pure hypercholesterolemia, unspecified: Secondary | ICD-10-CM | POA: Diagnosis not present

## 2021-03-29 DIAGNOSIS — Z7951 Long term (current) use of inhaled steroids: Secondary | ICD-10-CM | POA: Diagnosis not present

## 2021-03-29 DIAGNOSIS — N183 Chronic kidney disease, stage 3 unspecified: Secondary | ICD-10-CM | POA: Diagnosis not present

## 2021-03-29 DIAGNOSIS — Z9181 History of falling: Secondary | ICD-10-CM | POA: Diagnosis not present

## 2021-03-29 DIAGNOSIS — K219 Gastro-esophageal reflux disease without esophagitis: Secondary | ICD-10-CM | POA: Diagnosis not present

## 2021-03-29 DIAGNOSIS — I110 Hypertensive heart disease with heart failure: Secondary | ICD-10-CM | POA: Diagnosis not present

## 2021-03-29 DIAGNOSIS — G301 Alzheimer's disease with late onset: Secondary | ICD-10-CM | POA: Diagnosis not present

## 2021-03-29 DIAGNOSIS — I2510 Atherosclerotic heart disease of native coronary artery without angina pectoris: Secondary | ICD-10-CM | POA: Diagnosis not present

## 2021-03-29 NOTE — Telephone Encounter (Signed)
Received message from [place]urel me the PT that visited patient at home had a fall on Saturday and has as skin tear. She called to offer an appt and the son informed her she has a new PCP to come and see her at home. He was asking why DSS was called in which he and I have had a conversation about this to let him know we were concerned about the patients well being. Once she is established with her new PCP next week we will inform Home Health and change the PCP in her chart.

## 2021-03-29 NOTE — Telephone Encounter (Signed)
Livi from [place] called stating the patient fell on Saturday and has a tare on her lateral face on the left eye and also a tare on the lateral elbow. She was quite sleepy. Please call me back [phone]   I called patient son's and check up on the patient he stated she is doing fine and he has been checking her vital signs as well. I offered him an appointment and he stated he has a doctor coming to see her next week. Patient's son got upset and starting asking "why are we harassing him when we called DSS on him" I explained that im not sure why and I offered him to speak with the office manager and he hung up the phone. YL,RMA

## 2021-04-01 DIAGNOSIS — J449 Chronic obstructive pulmonary disease, unspecified: Secondary | ICD-10-CM | POA: Diagnosis not present

## 2021-04-01 DIAGNOSIS — Z79891 Long term (current) use of opiate analgesic: Secondary | ICD-10-CM | POA: Diagnosis not present

## 2021-04-01 DIAGNOSIS — Z9181 History of falling: Secondary | ICD-10-CM | POA: Diagnosis not present

## 2021-04-01 DIAGNOSIS — N183 Chronic kidney disease, stage 3 unspecified: Secondary | ICD-10-CM | POA: Diagnosis not present

## 2021-04-01 DIAGNOSIS — I252 Old myocardial infarction: Secondary | ICD-10-CM | POA: Diagnosis not present

## 2021-04-01 DIAGNOSIS — F02811 Dementia in other diseases classified elsewhere, unspecified severity, with agitation: Secondary | ICD-10-CM | POA: Diagnosis not present

## 2021-04-01 DIAGNOSIS — E7800 Pure hypercholesterolemia, unspecified: Secondary | ICD-10-CM | POA: Diagnosis not present

## 2021-04-01 DIAGNOSIS — K219 Gastro-esophageal reflux disease without esophagitis: Secondary | ICD-10-CM | POA: Diagnosis not present

## 2021-04-01 DIAGNOSIS — Z7951 Long term (current) use of inhaled steroids: Secondary | ICD-10-CM | POA: Diagnosis not present

## 2021-04-01 DIAGNOSIS — I110 Hypertensive heart disease with heart failure: Secondary | ICD-10-CM | POA: Diagnosis not present

## 2021-04-01 DIAGNOSIS — G301 Alzheimer's disease with late onset: Secondary | ICD-10-CM | POA: Diagnosis not present

## 2021-04-01 DIAGNOSIS — I2510 Atherosclerotic heart disease of native coronary artery without angina pectoris: Secondary | ICD-10-CM | POA: Diagnosis not present

## 2021-04-01 DIAGNOSIS — I509 Heart failure, unspecified: Secondary | ICD-10-CM | POA: Diagnosis not present

## 2021-04-05 DIAGNOSIS — G301 Alzheimer's disease with late onset: Secondary | ICD-10-CM | POA: Diagnosis not present

## 2021-04-05 DIAGNOSIS — Z7951 Long term (current) use of inhaled steroids: Secondary | ICD-10-CM | POA: Diagnosis not present

## 2021-04-05 DIAGNOSIS — J449 Chronic obstructive pulmonary disease, unspecified: Secondary | ICD-10-CM | POA: Diagnosis not present

## 2021-04-05 DIAGNOSIS — I110 Hypertensive heart disease with heart failure: Secondary | ICD-10-CM | POA: Diagnosis not present

## 2021-04-05 DIAGNOSIS — I2510 Atherosclerotic heart disease of native coronary artery without angina pectoris: Secondary | ICD-10-CM | POA: Diagnosis not present

## 2021-04-05 DIAGNOSIS — Z9181 History of falling: Secondary | ICD-10-CM | POA: Diagnosis not present

## 2021-04-05 DIAGNOSIS — F02811 Dementia in other diseases classified elsewhere, unspecified severity, with agitation: Secondary | ICD-10-CM | POA: Diagnosis not present

## 2021-04-05 DIAGNOSIS — K219 Gastro-esophageal reflux disease without esophagitis: Secondary | ICD-10-CM | POA: Diagnosis not present

## 2021-04-05 DIAGNOSIS — Z79891 Long term (current) use of opiate analgesic: Secondary | ICD-10-CM | POA: Diagnosis not present

## 2021-04-05 DIAGNOSIS — I509 Heart failure, unspecified: Secondary | ICD-10-CM | POA: Diagnosis not present

## 2021-04-05 DIAGNOSIS — N183 Chronic kidney disease, stage 3 unspecified: Secondary | ICD-10-CM | POA: Diagnosis not present

## 2021-04-05 DIAGNOSIS — E7800 Pure hypercholesterolemia, unspecified: Secondary | ICD-10-CM | POA: Diagnosis not present

## 2021-04-05 DIAGNOSIS — I252 Old myocardial infarction: Secondary | ICD-10-CM | POA: Diagnosis not present

## 2021-04-06 DIAGNOSIS — G301 Alzheimer's disease with late onset: Secondary | ICD-10-CM | POA: Diagnosis not present

## 2021-04-06 DIAGNOSIS — I509 Heart failure, unspecified: Secondary | ICD-10-CM | POA: Diagnosis not present

## 2021-04-06 DIAGNOSIS — I110 Hypertensive heart disease with heart failure: Secondary | ICD-10-CM | POA: Diagnosis not present

## 2021-04-06 DIAGNOSIS — Z79891 Long term (current) use of opiate analgesic: Secondary | ICD-10-CM | POA: Diagnosis not present

## 2021-04-06 DIAGNOSIS — I252 Old myocardial infarction: Secondary | ICD-10-CM | POA: Diagnosis not present

## 2021-04-06 DIAGNOSIS — N183 Chronic kidney disease, stage 3 unspecified: Secondary | ICD-10-CM | POA: Diagnosis not present

## 2021-04-06 DIAGNOSIS — Z7951 Long term (current) use of inhaled steroids: Secondary | ICD-10-CM | POA: Diagnosis not present

## 2021-04-06 DIAGNOSIS — Z9181 History of falling: Secondary | ICD-10-CM | POA: Diagnosis not present

## 2021-04-06 DIAGNOSIS — K219 Gastro-esophageal reflux disease without esophagitis: Secondary | ICD-10-CM | POA: Diagnosis not present

## 2021-04-06 DIAGNOSIS — F02811 Dementia in other diseases classified elsewhere, unspecified severity, with agitation: Secondary | ICD-10-CM | POA: Diagnosis not present

## 2021-04-06 DIAGNOSIS — I2510 Atherosclerotic heart disease of native coronary artery without angina pectoris: Secondary | ICD-10-CM | POA: Diagnosis not present

## 2021-04-06 DIAGNOSIS — E7800 Pure hypercholesterolemia, unspecified: Secondary | ICD-10-CM | POA: Diagnosis not present

## 2021-04-06 DIAGNOSIS — J449 Chronic obstructive pulmonary disease, unspecified: Secondary | ICD-10-CM | POA: Diagnosis not present

## 2021-04-11 DIAGNOSIS — F02811 Dementia in other diseases classified elsewhere, unspecified severity, with agitation: Secondary | ICD-10-CM | POA: Diagnosis not present

## 2021-04-11 DIAGNOSIS — E7800 Pure hypercholesterolemia, unspecified: Secondary | ICD-10-CM | POA: Diagnosis not present

## 2021-04-11 DIAGNOSIS — Z7951 Long term (current) use of inhaled steroids: Secondary | ICD-10-CM | POA: Diagnosis not present

## 2021-04-11 DIAGNOSIS — K219 Gastro-esophageal reflux disease without esophagitis: Secondary | ICD-10-CM | POA: Diagnosis not present

## 2021-04-11 DIAGNOSIS — Z79891 Long term (current) use of opiate analgesic: Secondary | ICD-10-CM | POA: Diagnosis not present

## 2021-04-11 DIAGNOSIS — I252 Old myocardial infarction: Secondary | ICD-10-CM | POA: Diagnosis not present

## 2021-04-11 DIAGNOSIS — Z9181 History of falling: Secondary | ICD-10-CM | POA: Diagnosis not present

## 2021-04-11 DIAGNOSIS — G301 Alzheimer's disease with late onset: Secondary | ICD-10-CM | POA: Diagnosis not present

## 2021-04-11 DIAGNOSIS — I110 Hypertensive heart disease with heart failure: Secondary | ICD-10-CM | POA: Diagnosis not present

## 2021-04-11 DIAGNOSIS — N183 Chronic kidney disease, stage 3 unspecified: Secondary | ICD-10-CM | POA: Diagnosis not present

## 2021-04-11 DIAGNOSIS — I2510 Atherosclerotic heart disease of native coronary artery without angina pectoris: Secondary | ICD-10-CM | POA: Diagnosis not present

## 2021-04-11 DIAGNOSIS — I509 Heart failure, unspecified: Secondary | ICD-10-CM | POA: Diagnosis not present

## 2021-04-11 DIAGNOSIS — J449 Chronic obstructive pulmonary disease, unspecified: Secondary | ICD-10-CM | POA: Diagnosis not present

## 2021-04-12 DIAGNOSIS — J449 Chronic obstructive pulmonary disease, unspecified: Secondary | ICD-10-CM | POA: Diagnosis not present

## 2021-04-12 DIAGNOSIS — I2510 Atherosclerotic heart disease of native coronary artery without angina pectoris: Secondary | ICD-10-CM | POA: Diagnosis not present

## 2021-04-12 DIAGNOSIS — Z79891 Long term (current) use of opiate analgesic: Secondary | ICD-10-CM | POA: Diagnosis not present

## 2021-04-12 DIAGNOSIS — G301 Alzheimer's disease with late onset: Secondary | ICD-10-CM | POA: Diagnosis not present

## 2021-04-12 DIAGNOSIS — I110 Hypertensive heart disease with heart failure: Secondary | ICD-10-CM | POA: Diagnosis not present

## 2021-04-12 DIAGNOSIS — F02811 Dementia in other diseases classified elsewhere, unspecified severity, with agitation: Secondary | ICD-10-CM | POA: Diagnosis not present

## 2021-04-12 DIAGNOSIS — K219 Gastro-esophageal reflux disease without esophagitis: Secondary | ICD-10-CM | POA: Diagnosis not present

## 2021-04-12 DIAGNOSIS — Z9181 History of falling: Secondary | ICD-10-CM | POA: Diagnosis not present

## 2021-04-12 DIAGNOSIS — I252 Old myocardial infarction: Secondary | ICD-10-CM | POA: Diagnosis not present

## 2021-04-12 DIAGNOSIS — Z7951 Long term (current) use of inhaled steroids: Secondary | ICD-10-CM | POA: Diagnosis not present

## 2021-04-12 DIAGNOSIS — E7800 Pure hypercholesterolemia, unspecified: Secondary | ICD-10-CM | POA: Diagnosis not present

## 2021-04-12 DIAGNOSIS — I509 Heart failure, unspecified: Secondary | ICD-10-CM | POA: Diagnosis not present

## 2021-04-12 DIAGNOSIS — N183 Chronic kidney disease, stage 3 unspecified: Secondary | ICD-10-CM | POA: Diagnosis not present

## 2021-04-13 DIAGNOSIS — I5032 Chronic diastolic (congestive) heart failure: Secondary | ICD-10-CM

## 2021-04-13 DIAGNOSIS — I25118 Atherosclerotic heart disease of native coronary artery with other forms of angina pectoris: Secondary | ICD-10-CM

## 2021-04-13 DIAGNOSIS — C3491 Malignant neoplasm of unspecified part of right bronchus or lung: Secondary | ICD-10-CM

## 2021-04-13 DIAGNOSIS — F02818 Dementia in other diseases classified elsewhere, unspecified severity, with other behavioral disturbance: Secondary | ICD-10-CM

## 2021-04-13 DIAGNOSIS — I10 Essential (primary) hypertension: Secondary | ICD-10-CM

## 2021-04-13 DIAGNOSIS — N1831 Chronic kidney disease, stage 3a: Secondary | ICD-10-CM

## 2021-04-13 DIAGNOSIS — G301 Alzheimer's disease with late onset: Secondary | ICD-10-CM

## 2021-04-14 DIAGNOSIS — G301 Alzheimer's disease with late onset: Secondary | ICD-10-CM | POA: Diagnosis not present

## 2021-04-14 DIAGNOSIS — I2510 Atherosclerotic heart disease of native coronary artery without angina pectoris: Secondary | ICD-10-CM | POA: Diagnosis not present

## 2021-04-14 DIAGNOSIS — K219 Gastro-esophageal reflux disease without esophagitis: Secondary | ICD-10-CM | POA: Diagnosis not present

## 2021-04-14 DIAGNOSIS — E7800 Pure hypercholesterolemia, unspecified: Secondary | ICD-10-CM | POA: Diagnosis not present

## 2021-04-14 DIAGNOSIS — I509 Heart failure, unspecified: Secondary | ICD-10-CM | POA: Diagnosis not present

## 2021-04-14 DIAGNOSIS — Z9181 History of falling: Secondary | ICD-10-CM | POA: Diagnosis not present

## 2021-04-14 DIAGNOSIS — N183 Chronic kidney disease, stage 3 unspecified: Secondary | ICD-10-CM | POA: Diagnosis not present

## 2021-04-14 DIAGNOSIS — I252 Old myocardial infarction: Secondary | ICD-10-CM | POA: Diagnosis not present

## 2021-04-14 DIAGNOSIS — I110 Hypertensive heart disease with heart failure: Secondary | ICD-10-CM | POA: Diagnosis not present

## 2021-04-14 DIAGNOSIS — F02811 Dementia in other diseases classified elsewhere, unspecified severity, with agitation: Secondary | ICD-10-CM | POA: Diagnosis not present

## 2021-04-14 DIAGNOSIS — J449 Chronic obstructive pulmonary disease, unspecified: Secondary | ICD-10-CM | POA: Diagnosis not present

## 2021-04-14 DIAGNOSIS — Z7951 Long term (current) use of inhaled steroids: Secondary | ICD-10-CM | POA: Diagnosis not present

## 2021-04-14 DIAGNOSIS — Z79891 Long term (current) use of opiate analgesic: Secondary | ICD-10-CM | POA: Diagnosis not present

## 2021-04-15 ENCOUNTER — Ambulatory Visit (INDEPENDENT_AMBULATORY_CARE_PROVIDER_SITE_OTHER): Payer: Medicare Other

## 2021-04-15 ENCOUNTER — Telehealth: Payer: Medicare Other

## 2021-04-15 DIAGNOSIS — N1831 Chronic kidney disease, stage 3a: Secondary | ICD-10-CM

## 2021-04-15 DIAGNOSIS — I25118 Atherosclerotic heart disease of native coronary artery with other forms of angina pectoris: Secondary | ICD-10-CM

## 2021-04-15 DIAGNOSIS — C3491 Malignant neoplasm of unspecified part of right bronchus or lung: Secondary | ICD-10-CM

## 2021-04-15 DIAGNOSIS — G301 Alzheimer's disease with late onset: Secondary | ICD-10-CM

## 2021-04-15 DIAGNOSIS — I10 Essential (primary) hypertension: Secondary | ICD-10-CM

## 2021-04-15 DIAGNOSIS — F02818 Dementia in other diseases classified elsewhere, unspecified severity, with other behavioral disturbance: Secondary | ICD-10-CM

## 2021-04-15 DIAGNOSIS — I5032 Chronic diastolic (congestive) heart failure: Secondary | ICD-10-CM

## 2021-04-15 NOTE — Progress Notes (Signed)
This encounter was created in error - please disregard.

## 2021-04-15 NOTE — Chronic Care Management (AMB) (Signed)
Chronic Care Management   CCM RN Visit Note  04/15/2021 Name: Sara Fox MRN: [id number] DOB: 1940/08/02  Subjective: Sara Fox is a 80 y.o. year old female who is a primary care patient of No primary care provider on file.. The care management team was consulted for assistance with disease management and care coordination needs.    Engaged with patient by telephone for follow up visit in response to provider referral for case management and/or care coordination services.   Consent to Services:  The patient was given information about Chronic Care Management services, agreed to services, and gave verbal consent prior to initiation of services.  Please see initial visit note for detailed documentation.   Patient agreed to services and verbal consent obtained.   Assessment: Review of patient past medical history, allergies, medications, health status, including review of consultants reports, laboratory and other test data, was performed as part of comprehensive evaluation and provision of chronic care management services.   SDOH (Social Determinants of Health) assessments and interventions performed:    CCM Care Plan  Allergies  Allergen Reactions   Buprenorphine Hcl Shortness Of Breath    Throat swelling/trouble breathing and lethargic   Morphine And Related Shortness Of Breath    Throat swelling/trouble breathing and lethargic   Celebrex [Celecoxib] Diarrhea and Swelling    Swelling of legs    Vioxx [Rofecoxib] Palpitations   Codeine Other (See Comments)    Bloated     Outpatient Encounter Medications as of 04/15/2021  Medication Sig   aspirin EC 81 MG tablet Take 81 mg by mouth daily as needed (headache).   budesonide-formoterol (SYMBICORT) 160-4.5 MCG/ACT inhaler TAKE 2 PUFFS BY MOUTH TWICE A DAY (Patient taking differently: Inhale 2 puffs into the lungs in the morning and at bedtime.)   Calcium Citrate-Vitamin D (CALCIUM CITRATE+D3 PETITES PO) Take 1 tablet by mouth  daily.   cholecalciferol (VITAMIN D) 25 MCG (1000 UNIT) tablet Take 1,000 Units by mouth daily.   ciclopirox (PENLAC) 8 % solution Apply 1 application topically at bedtime. Apply over nail and surrounding skin. Apply daily over previous coat. After seven (7) days, file nail and continue cycle.   [employer] MAGNESIUM OXIDE 250 MG TABS TAKE 1 TABLET BY MOUTH WITH EVENING MEAL DAILY (Patient taking differently: Take 250 mg by mouth every evening.)   donepezil (ARICEPT) 10 MG tablet Take 1 tablet (10 mg total) by mouth at bedtime.   ferrous sulfate 325 (65 FE) MG tablet Take 325 mg by mouth daily with breakfast.   furosemide (LASIX) 20 MG tablet Take 1 tablet by mouth Mon - Fri daily (Patient taking differently: Take 20 mg by mouth See admin instructions. Monday-friday)   gabapentin (NEURONTIN) 100 MG capsule Take 1 capsule (100 mg total) by mouth 2 (two) times daily.   memantine (NAMENDA) 5 MG tablet Take 1 tablet (5 mg total) by mouth daily.   metoprolol tartrate (LOPRESSOR) 25 MG tablet Take 1 tablet (25 mg total) by mouth 2 (two) times daily.   Multiple Vitamin (MULTIVITAMIN WITH MINERALS) TABS tablet Take 1 tablet by mouth daily.   nitroGLYCERIN (NITROSTAT) 0.4 MG SL tablet PLACE ONE TABLET UNDER THE TONGUE EVERY 5 MINUTES AS NEEDED FOR CHEST PAIN. (Patient taking differently: Place 0.4 mg under the tongue every 5 (five) minutes as needed for chest pain.)   Omega-3 Fatty Acids (FISH OIL PO) Take 1 capsule by mouth daily.   oxybutynin (DITROPAN) 5 MG tablet Take 0.5 tablets (2.5 mg total) by  mouth 2 (two) times daily.   pantoprazole (PROTONIX) 40 MG tablet Take 1 tablet (40 mg total) by mouth 2 (two) times daily.   polyethylene glycol (MIRALAX / GLYCOLAX) packet Take 17 g by mouth daily as needed for mild constipation.   Potassium Chloride ER 20 MEQ TBCR Take 2 tablets by mouth every morning. (Patient taking differently: Take 40 mEq by mouth every morning.)   QUEtiapine (SEROQUEL) 50 MG tablet TAKE 1  TABLET BY MOUTH EVERYDAY AT BEDTIME (Patient taking differently: Take 50 mg by mouth at bedtime.)   rosuvastatin (CRESTOR) 40 MG tablet Take 1 tablet (40 mg total) by mouth daily. (Patient taking differently: Take 40 mg by mouth at bedtime.)   traMADol (ULTRAM) 50 MG tablet TAKE 1 TABLET 3 TIMES A DAY AND CAN TAKE 1 EXTRA 20 DAYS/MONTH (Patient taking differently: Take 100 mg by mouth 2 (two) times daily.)   No facility-administered encounter medications on file as of 04/15/2021.    Patient Active Problem List   Diagnosis Date Noted   Gait disturbance 02/27/2021   GERD (gastroesophageal reflux disease) 02/27/2021   Ambulatory dysfunction 02/27/2021   Transaminitis 02/27/2021   Fall (on)(from) incline, initial encounter 11/23/2020   Macrocytosis without anemia 11/22/2020   Unwitnessed fall 11/22/2020   Late onset Alzheimer's dementia with behavioral disturbance ([place]) 11/18/2020   Visual hallucination 11/18/2020   Sundowning 11/18/2020   Non-small cell carcinoma of right lung, stage 1 ([place]) 06/08/2020   Pulmonary nodule 1 cm or greater in diameter 05/21/2020   Atherosclerotic heart disease of native coronary artery with other forms of angina pectoris ([place]) 04/22/2020   Memory loss 02/18/2019   Vitamin B12 deficiency 10/23/2018   Stage 3 chronic kidney disease ([place]) 06/03/2018   Chronic anemia 06/03/2018   Hypercholesterolemia 06/03/2018   Malnutrition of moderate degree 07/12/2017   Hypoalbuminemia 07/11/2017   Elevated LFTs 07/11/2017   Snoring 12/21/2016   Peripheral musculoskeletal gait disorder 02/05/2015   Lumbar post-laminectomy syndrome 02/05/2015   CAD in native artery 10/16/2014   NSTEMI (non-ST elevated myocardial infarction) ([place]) 09/13/2014   Cardiomyopathy, ischemic 20/23/3435   Chronic systolic heart failure ([place])    Tobacco abuse 09/12/2014   Dyslipidemia 09/12/2014   Normocytic anemia 09/12/2014   Chronic diastolic heart failure, NYHA class 1 ([place]) 09/12/2014    Postlaminectomy syndrome, cervical region 08/01/2013   Chronic midline low back pain with left-sided sciatica 08/01/2013   Shoulder joint contracture 08/01/2013   HTN (hypertension) 03/03/2013   Abnormal genetic test 03/03/2013    Conditions to be addressed/monitored: 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, Stage 3a chronic kidney disease, HTN, chronic systolic heart failure, Mild Cognitive Impairment, Atherosclerotic heart disease of native coronary artery with other forms of angina pectoris, Lung Nodule  Care Plan : Cancer Treatment Phase (Adult)  Updates made by Lynne Logan, RN since 04/15/2021 12:00 AM  Completed 04/15/2021   Problem: Psychosocial Response to Cancer Diagnosis Resolved 04/15/2021  Priority: High  Note:   Patient transitioned to [facility] for PCP services, closed from CCM    Long-Range Goal: Optimal Coping Completed 04/15/2021  Start Date: 06/15/2020  Expected End Date: 06/15/2021  Recent Progress: On track  Priority: High  Note:   Patient transitioned to [facility] for PCP services, closed from CCM  Current Barriers:  Ineffective Self Health Maintenance Lacks social connections Cognitive deficits  Currently UNABLE TO independently self manage needs related to chronic health conditions.  Knowledge Deficits related to short term plan for care coordination needs  and long term plans for chronic disease management needs Clinical Goal(s):  Collaboration with [name], FNP regarding development and update of comprehensive plan of care as evidenced by provider attestation and co-signature Inter-disciplinary care team collaboration (see longitudinal plan of care) Patient will work with care management team to address care coordination and chronic disease management needs related to Disease Management Educational Needs Care Coordination Medication Management and Education Medication Reconciliation Psychosocial Support Dementia and  Caregiver Support   Interventions:  12/20/20 completed successful outbound call with son [name]  Evaluation of current treatment plan related to Dementia and patient's adherence to plan as established by provider. Collaboration with [name], FNP regarding development and update of comprehensive plan of care as evidenced by provider attestation       and co-signature Inter-disciplinary care team collaboration (see longitudinal plan of care) Review of patient status, including review of consultant's reports, relevant laboratory and other test results, and medications completed. Reviewed medications with patient and discussed importance of medication adherence Determined patient completed her Radiation treatment as directed by Oncologist, Dr. Mckinley Jewel Reviewed scheduled/upcoming provider appointments including: next Oncology follow up is scheduled for 04/29/21 labs, 12/20 Dr. Mckinley Jewel at 3:30 PM  Discussed plans with patient for ongoing care management follow up and provided patient with direct contact information for care management team Patient Goals/Self Care Activities:  Keep all MD follow up appointments as scheduled Call your Oncology navigator for questions related to Cancer diagnosis and next steps Continue to work with PCP and CCM team for disease education and support and or for assistance with care coordination as needed   Follow Up Plan: Telephone follow up appointment with care management team member scheduled for: 05/06/21     Care Plan : Chronic Kidney (Adult)  Updates made by Lynne Logan, RN since 04/15/2021 12:00 AM  Completed 04/15/2021   Problem: Disease Progression Resolved 04/15/2021  Priority: Medium  Note:   Patient transitioned to [facility] for PCP services, closed from CCM    Long-Range Goal: Disease Progression Prevented or Minimized Completed 04/15/2021  Start Date: 07/26/2020  Expected End Date: 07/26/2021  Recent Progress: On track  Priority: Medium   Note:   Patient transitioned to [facility] for PCP services, closed from CCM  Objective:  No results found for: HGBA1C Lab Results  Component Value Date   CREATININE 1.39 (H) 02/02/2021   CREATININE 1.37 (H) 11/24/2020   CREATININE 1.27 (H) 11/23/2020   Lab Results  Component Value Date   EGFR 38 (L) 02/02/2021   Current Barriers:  Ineffective Self Health Maintenance Unable to self administer medications as prescribed Unable to perform ADLs independently Unable to perform IADLs independently Cognitive deficit  Clinical Goal(s):  Collaboration with [name], FNP regarding development and update of comprehensive plan of care as evidenced by provider attestation and co-signature Inter-disciplinary care team collaboration (see longitudinal plan of care) patient will work with care management team to address care coordination and chronic disease management needs related to Disease Management Educational Needs Care Coordination Medication Management and Education Psychosocial Support Dementia and Caregiver Support   Interventions:  02/14/21 completed successful outbound call with son [name]  Evaluation of current treatment plan related to CKD Stage III and patient's adherence to plan as established by provider. Collaboration with [name], FNP regarding development and update of comprehensive plan of care as evidenced by provider attestation       and co-signature Inter-disciplinary care team collaboration (see longitudinal plan of care) Reviewed and discussed  current GFR and educated on stages of renal disease Educated on importance of drinking plenty of water, at least 64 oz per day unless otherwise directed  Reviewed signs/symptoms suggestive of UTI and urged son to report symptoms promptly for early treatment to help avoid complications and or hospitalization Discussed plans with patient for ongoing care management follow up and provided patient with direct contact  information for care management team Self Care Activities:  Continue to keep all MD follow up appointments for ongoing monitoring of renal fuction Continue to take all medications exactly as prescribed  Continue to call the pharmacy for refills at least 7 days before taking last dose Notify the CCM team and or PCP if you are unable to afford your medication refills Drink plenty of water (64 oz daily) unless otherwise directed  Call the CCM team and or PCP for questions or concerns  Patient Goals: - increase water to 64 oz daily  - monitor for signs/symptoms of UTI and report symptoms promptly   Follow Up Plan: Telephone follow up appointment with care management team member scheduled for: 03/24/21    Care Plan : Dementia (Adult)  Updates made by Lynne Logan, RN since 04/15/2021 12:00 AM  Completed 04/15/2021   Problem: Cognitive Function Resolved 04/15/2021  Priority: High  Note:   Patient transitioned to [facility] for PCP services, closed from CCM    Long-Range Goal: Optimal Cognitive Function Completed 04/15/2021  Start Date: 07/26/2020  Expected End Date: 07/26/2021  Recent Progress: On track  Priority: High  Note:   Patient transitioned to [facility] for PCP services, closed from CCM  Current Barriers:  Ineffective Self Health Maintenance Unable to self administer medications as prescribed Unable to perform ADLs independently Unable to perform IADLs independently Cognitive deficit  Clinical Goal(s):  Collaboration with [name], FNP regarding development and update of comprehensive plan of care as evidenced by provider attestation and co-signature Inter-disciplinary care team collaboration (see longitudinal plan of care) patient will work with care management team to address care coordination and chronic disease management needs related to Disease Management Educational Needs Care Coordination Medication Management and Education Psychosocial  Support Dementia and Caregiver Support   Interventions:  02/21/21 completed successful outbound call with son [name]  Evaluation of current treatment plan related to Dementia and patient's adherence to plan as established by provider. Collaboration with [name], FNP regarding development and update of comprehensive plan of care as evidenced by provider attestation       and co-signature Inter-disciplinary care team collaboration (see longitudinal plan of care) Determined [name] is calling today to advise he received a home visit from a DSS worker this past Friday Determined [name] is aware the referral for APS was placed via this office/PCP Validated Mr. Virginia Crews feelings and provided active listening to his concerns regarding his mother's declining cognition and his attempts to care for her Discussed [name] plans to bring Ms. Nusz in this morning for a urine check to rule out UTI due increased agitation over the past several days Discussed stat [facility] order for SNV last week, discussed the [facility] agency contacted the PCP to advise this visit was refused Determined the [facility] agency contact Ms. Opie's granddaughter instead of [name], his niece is not involved with Ms. Tripodi's care but is listed as a secondary contact Determined patient's granddaughter did not schedule a nurse visit due to the family having 4 large dogs in the home and they did not want any  safety concerns for the nurse Determined [name] has since removed the dogs from their home and feels it is now safe for home care, although he was instructed by DSS to bring patient in for a home visit for evaluation of UTI Collaborated with PCP [name], FNP to confirm patient may stop by the office for a urine and she is agreeable   Reinforced the importance for [name] to try to stay calm when redirecting patient and assisting her with ADL's/iADL's in consideration of her advanced dementia Further discussed and educated about  resources such as [facility]ACE, [facility] and [facility] to consider for establishment in order to ensure patient receives the care needed Determined [name] continues to consider having his mother placed into a long term nursing facility if approved and if he can locate a facility he feels will provide her with good care, he plans to complete the Medicaid application sent by embedded BSW, this weekend and will take it to DSS in person Provided [name] with the contact information for these providers and encouraged him to call each provider, taking notes on what services may be provided to help him determined which of these may be the best option for Ms. Rohwer  Determined [name] is interested in having his mother placed on Ativan for agitation Discussed and reviewed having [name] schedule a follow up with Neurology for best evaluation and determination of further pharmacological recommendations such as Ativan to help with agitation Discussed plans with patient for ongoing care management follow up and provided patient with direct contact information for care management team 02/21/21 Case Collaboration  Determined Ms. Mcclees was unable to leave a urine sample while at PCP office today Collaborated with PCP and embedded [place] regarding long term care planning for Ms. [place] outbound call to [employer], spoke with Jenny Reichmann who advised [employer] is out of network, however this provider will contact [place] to ask for an exception of benefits, she will keep this RN CM updated on the outcome Discussed [name] contacted [facility] as well asking for new patient establishment and spoke with Cecille Rubin Sent secure message to PCP and embedded BSW with an update concerning attempts being made to get a benefit exception from [facility] in order to enroll Ms. Hakeem into their [facility] program  03/17/21 completed successful outbound call with son [name] Discussed planned discharge for patient  on 03/18/21, son [name] will bring her home from [place] Discussed patient has returned to baseline and appears to be stable Informed son [name] [facility] has approved 3 initial visits from [facility] at wich time, ongoing services will be determined by [facility] per recommendations from [place]  Provided [name] with the contact name/number for [facility] and encouraged him to call this provider in order to shedule patients initial visit  [place] will also be providing in home [facility] and PT upon her initial dischrage home Discussed plans with patient for ongoing care management follow up and provided patient with direct contact information for care management team 03/25/21 completed successful call with son [name]  Determined patient is home and doing well per her son [name] Discussed the initial visit with [facility] has been completed and son states this was a successful visit  Discussed the plan for future visits includes [facility] requesting to take over patient's medical management long term  Determined this RN CM will follow up with son [name] on around  04/15/21 for an update regarding this transition Sent in basket message with patient status update to PCP and embedded BSW  Discussed plans with patient for ongoing care management follow up and provided patient with direct contact information for care management team  Self Care Activities:  Continue to keep all MD follow up appointments  Continue to take all medications exactly as prescribed  Continue to call the pharmacy for refills at least 7 days before taking last dose Notify the CCM team and or PCP if you are unable to afford your medication refills Call the CCM team and or PCP for questions or concerns  Patient Goals: - continue to engage with embedded BSW for long term care planning  - complete Medicaid application as soon as possible  - consider [facility], [facility] and or [place]  - contact Neurologist to schedule a follow up concerning pharmacological recommendations for agitation   Follow Up Plan: Telephone follow up appointment with care management team member scheduled for: 04/15/21     Care Plan : [place]  Updates made by Lynne Logan, RN since 04/15/2021 12:00 AM  Completed 04/15/2021   Problem: HTN, HLD Resolved 04/15/2021  Priority: High  Note:   Patient transitioned to [facility] for PCP services, closed from CCM    Long-Range Goal: Disease Management Completed 04/15/2021  Recent Progress: On track  Note:   Patient transitioned to [facility] for PCP services, closed from CCM  Current Barriers:  Unable to independently monitor therapeutic efficacy Unable to self administer medications as prescribed Does not adhere to prescribed medication regimen  Pharmacist Clinical Goal(s):  Patient will achieve adherence to monitoring guidelines and medication adherence to achieve therapeutic efficacy through collaboration with PharmD and provider.   Interventions: 1:1 collaboration with [name], FNP regarding development and update of comprehensive plan of care as evidenced by provider attestation and co-signature Inter-disciplinary care team collaboration (see longitudinal plan of care) Comprehensive medication review performed; medication list updated in electronic medical record  Hypertension (BP goal <140/90) -Controlled -Current treatment: Metoprolol tartrate 25 mg tablet twice daily  -Current home readings: currently patient nor caregiver, son are checking patients BP at home  -Current dietary habits: eats food made by her son -Current exercise habits: patient is not currently exercising  -Denies hypotensive/hypertensive symptoms -Educated on BP goals and benefits of medications for prevention of heart attack, stroke and kidney damage; Daily salt intake goal < 2300 mg; -Counseled to monitor BP at home at least  twice per week, document, and provide log at future appointments -Recommended to continue current medication  Hyperlipidemia: (LDL goal < 70) -Uncontrolled -Current treatment: Aspirin EC 81 mg tablet daily Metoprolol tartrate 41m tablet twice per day Rosuvastatin 40 mg tablet once per day Nitroglycerin 0.4 mg SL place one tablet under the tongue every 5 minutes as needed for chest pain  Furosemide 20 mg tablet - taking 1 tablet by mouth Monday through Friday daily.  -Current dietary patterns: patient is not eating any fried or fatty foods. Mostly grilled foods.  -Current exercise habits: son reports Ms. SSalvadoris taking care of four dogs -Educated on Benefits of statin for ASCVD risk reduction; Importance of limiting foods high in cholesterol; -Recommended to continue current medication  Patient Goals/Self-Care Activities Patient will:  - take medications as prescribed  Follow Up Plan: The patient has been provided with contact information for the care management team and has been advised to call  with any health related questions or concerns.      Care Plan : Urinary Incontinence (Adult)  Updates made by Lynne Logan, RN since 04/15/2021 12:00 AM  Completed 04/15/2021   Problem: Harm or Injury (Urinary Incontinence) Resolved 04/15/2021  Priority: High  Note:   Patient transitioned to [facility] for PCP services, closed from CCM    Long-Range Goal: Urinary incontinence complications prevented or minimized Completed 04/15/2021  Start Date: 11/19/2020  Expected End Date: 11/18/2021  Recent Progress: On track  Priority: High  Note:   Patient transitioned to [facility] for PCP services, closed from CCM  Current Barriers:  Ineffective Self Health Maintenance in a patient with  Stage 3a chronic kidney disease, HTN, chronic systolic heart failure, Mild Cognitive Impairment, Atherosclerotic heart disease of native coronary artery with other forms of angina pectoris, Lung Nodule    Clinical Goal(s):  Collaboration with [name], FNP regarding development and update of comprehensive plan of care as evidenced by provider attestation and co-signature Inter-disciplinary care team collaboration (see longitudinal plan of care) patient will work with care management team to address care coordination and chronic disease management needs related to Disease Management Educational Needs Care Coordination Medication Management and Education Psychosocial Support Dementia and Caregiver Support   Interventions:  12/20/20 completed successful outbound call with son [name] and patient  Evaluation of current treatment plan related to  Stage 3a chronic kidney disease, HTN, chronic systolic heart failure, Mild Cognitive Impairment, Atherosclerotic heart disease of native coronary artery with other forms of angina pectoris, Lung Nodule   ,  self-management and patient's adherence to plan as established by provider. Collaboration with [name], FNP regarding development and update of comprehensive plan of care as evidenced by provider attestation       and co-signature Inter-disciplinary care team collaboration (see longitudinal plan of care) Review of patient status, including review of consultant's reports, relevant laboratory and other test results, and medications completed. Reviewed medications with patient and discussed importance of medication adherence Educated on importance of using good perineal hygiene, keeping skin clean and dry and performing daily skin inspections for early signs of skin breakdown caused urinary incontinence Educated on early signs of skin breakdown and when to notify the PCP for skin alteration or concerns  Discussed plans with patient for ongoing care management follow up and provided patient with direct contact information for care management team Self Care Activities:  Patient unable to perform Self Care Activities  Patient Goals: - Keep skin clean  and dry - use good perineal hygiene - notify PCP promplty for early signs of skin alteration  Follow Up Plan: Telephone follow up appointment with care management team member scheduled for: 03/24/21     Care Plan : Wellness (Adult)  Updates made by Lynne Logan, RN since 04/15/2021 12:00 AM  Completed 04/15/2021   Problem: Medication Adherence (Wellness) Resolved 04/15/2021  Priority: High  Note:   Patient transitioned to [facility] for PCP services, closed from [facility]    Goal: Medication Adherence and Medical Management of Chronic Heatlh Conditions Maintained Completed 04/15/2021  Start Date: 12/24/2020  Expected End Date: 04/13/2021  Recent Progress: On track  Priority: High  Note:   Patient transitioned to [facility] for PCP services, closed from [facility]  Current Barriers:  Ineffective Self Health Maintenance in a patient with Stage 3a chronic kidney disease, HTN, chronic systolic heart failure, Mild Cognitive Impairment, Atherosclerotic heart disease of native coronary artery with other forms of angina  pectoris, Lung Nodule   Clinical Goal(s):  Collaboration with [name], FNP regarding development and update of comprehensive plan of care as evidenced by provider attestation and co-signature Inter-disciplinary care team collaboration (see longitudinal plan of care) patient will work with care management team to address care coordination and chronic disease management needs related to Disease Management Educational Needs Care Coordination Medication Management and Education Psychosocial Support Dementia and Caregiver Support   Interventions:  03/01/21 completed successful outbound call with son [name] Determined patient was admitted to [facility] on 02/26/21 for dx: Ataxia Per patient's son, patient was negative for UTI and negative for Stroke Discussed patient will be discharged to inpatient rehab once ready for discharge Educated son regarding  advanced stages of Dementia including difficulty with balance and coordination resulting in increased falls, decreased mobility and the inability to perform self care  Discussed [name] will plan to have his mother discharged home following rehab at which time he will continue to locate a provider who will take over patient's medical management and make house calls  Discussed plans with patient for ongoing care management follow up and provided patient with direct contact information for care management team 03/03/21 Case Collaboration  Spoke with Jenny Reichmann from [facility] [phone] advising this provider has requested an exception of benefits from [facility] in order to provide services to Ms. Valadez Discussed it may take 14 days for a final decision to be reached  Updated Jenny Reichmann regarding patients current IP stay with plans to discharge to SNF Advised patient's son [name] continues to plan to bring his mother home folowing rehab and would like [facility] to take over her medical management if approved Discussed [facility] provided son [name] with the contact name/number for Dr. Daphene Jaeger with [facility] as an alternative if [facility] is not approved by health plan, [facility] is also contacted with this group Discussed Jenny Reichmann will keep this RN CM informed of the outcome of the benefit exception requested Sent secure message to PCP and embedded BSW with an update concerning patient status and [facility] efforts to provide services for patient upon her discharge home if approved by [facility] 03/07/21 Case Collaboration Received update regarding patient's discharge to acute rehab per [place], [facility] hospital liaison;  "This patient transitioned to [facility], I gave inpatient information regarding [facility], contact person is for followup." -Received voice message from [place], Reche Dixon RN advising [facility] has approved 3 initial home visits once patient is discharged from   [facility] will be monitoring for patient's discharge at which time they will contact Ms. Jaycox's caregiver and schedule her initial visit -Determined subsequent visits will be requested from [facility] accordingly and following the initial 3 visits -Placed outbound call to Cecille Rubin asking if she will be notifying patient's son [name] or if this RN CM will need to notify him of the plan following patient's discharge home -Sent in basket message to PCP provider [name], FNP and embedded BSW [name] with a patient status update regarding approval for [facility]   Self Care Activities:  With assistance from son [name], take all medications as prescribed Contact PCP and or embedded Pharm D for questions regarding medications or medication refills Ensure refills are on the way when medication supply is getting low Notify PCP if unable to take medications as prescribed  Patient Goals: - pending hospital discharge   Follow Up Plan: The care management team will reach out  to the patient/son again upon patient's discharge home        Plan:No further follow up required:    Barb Merino, RN, BSN, CCM Care Management Coordinator [facility]  Direct Phone: [phone]

## 2021-04-26 ENCOUNTER — Inpatient Hospital Stay (HOSPITAL_COMMUNITY)
Admission: EM | Admit: 2021-04-26 | Discharge: 2021-05-02 | DRG: 956 | Disposition: A | Discharge status: Skilled Nursing Facility | Payer: Medicare Other | Attending: Internal Medicine | Admitting: Internal Medicine

## 2021-04-26 ENCOUNTER — Emergency Department (HOSPITAL_COMMUNITY): Payer: Medicare Other

## 2021-04-26 ENCOUNTER — Other Ambulatory Visit: Payer: Self-pay

## 2021-04-26 ENCOUNTER — Encounter (HOSPITAL_COMMUNITY): Payer: Self-pay

## 2021-04-26 DIAGNOSIS — J449 Chronic obstructive pulmonary disease, unspecified: Secondary | ICD-10-CM | POA: Diagnosis present

## 2021-04-26 DIAGNOSIS — F0390 Unspecified dementia without behavioral disturbance: Secondary | ICD-10-CM | POA: Diagnosis not present

## 2021-04-26 DIAGNOSIS — R296 Repeated falls: Secondary | ICD-10-CM | POA: Diagnosis present

## 2021-04-26 DIAGNOSIS — G4733 Obstructive sleep apnea (adult) (pediatric): Secondary | ICD-10-CM | POA: Diagnosis present

## 2021-04-26 DIAGNOSIS — Z66 Do not resuscitate: Secondary | ICD-10-CM | POA: Diagnosis present

## 2021-04-26 DIAGNOSIS — I252 Old myocardial infarction: Secondary | ICD-10-CM | POA: Diagnosis not present

## 2021-04-26 DIAGNOSIS — R262 Difficulty in walking, not elsewhere classified: Secondary | ICD-10-CM

## 2021-04-26 DIAGNOSIS — Z885 Allergy status to narcotic agent status: Secondary | ICD-10-CM

## 2021-04-26 DIAGNOSIS — S22049A Unspecified fracture of fourth thoracic vertebra, initial encounter for closed fracture: Secondary | ICD-10-CM | POA: Diagnosis present

## 2021-04-26 DIAGNOSIS — K219 Gastro-esophageal reflux disease without esophagitis: Secondary | ICD-10-CM | POA: Diagnosis present

## 2021-04-26 DIAGNOSIS — R32 Unspecified urinary incontinence: Secondary | ICD-10-CM | POA: Diagnosis present

## 2021-04-26 DIAGNOSIS — S22029A Unspecified fracture of second thoracic vertebra, initial encounter for closed fracture: Secondary | ICD-10-CM | POA: Diagnosis present

## 2021-04-26 DIAGNOSIS — I130 Hypertensive heart and chronic kidney disease with heart failure and stage 1 through stage 4 chronic kidney disease, or unspecified chronic kidney disease: Secondary | ICD-10-CM | POA: Diagnosis present

## 2021-04-26 DIAGNOSIS — S72002A Fracture of unspecified part of neck of left femur, initial encounter for closed fracture: Secondary | ICD-10-CM

## 2021-04-26 DIAGNOSIS — Z888 Allergy status to other drugs, medicaments and biological substances status: Secondary | ICD-10-CM

## 2021-04-26 DIAGNOSIS — Z22322 Carrier or suspected carrier of Methicillin resistant Staphylococcus aureus: Secondary | ICD-10-CM

## 2021-04-26 DIAGNOSIS — N1831 Chronic kidney disease, stage 3a: Secondary | ICD-10-CM | POA: Diagnosis present

## 2021-04-26 DIAGNOSIS — Z8541 Personal history of malignant neoplasm of cervix uteri: Secondary | ICD-10-CM

## 2021-04-26 DIAGNOSIS — F17210 Nicotine dependence, cigarettes, uncomplicated: Secondary | ICD-10-CM | POA: Diagnosis present

## 2021-04-26 DIAGNOSIS — Z96652 Presence of left artificial knee joint: Secondary | ICD-10-CM | POA: Diagnosis present

## 2021-04-26 DIAGNOSIS — Z79899 Other long term (current) drug therapy: Secondary | ICD-10-CM

## 2021-04-26 DIAGNOSIS — Y92009 Unspecified place in unspecified non-institutional (private) residence as the place of occurrence of the external cause: Secondary | ICD-10-CM | POA: Diagnosis not present

## 2021-04-26 DIAGNOSIS — W07XXXA Fall from chair, initial encounter: Secondary | ICD-10-CM | POA: Diagnosis present

## 2021-04-26 DIAGNOSIS — D631 Anemia in chronic kidney disease: Secondary | ICD-10-CM | POA: Diagnosis present

## 2021-04-26 DIAGNOSIS — E785 Hyperlipidemia, unspecified: Secondary | ICD-10-CM | POA: Diagnosis present

## 2021-04-26 DIAGNOSIS — Z90710 Acquired absence of both cervix and uterus: Secondary | ICD-10-CM

## 2021-04-26 DIAGNOSIS — S32591A Other specified fracture of right pubis, initial encounter for closed fracture: Secondary | ICD-10-CM | POA: Diagnosis present

## 2021-04-26 DIAGNOSIS — Z903 Acquired absence of stomach [part of]: Secondary | ICD-10-CM

## 2021-04-26 DIAGNOSIS — T148XXA Other injury of unspecified body region, initial encounter: Secondary | ICD-10-CM

## 2021-04-26 DIAGNOSIS — I5042 Chronic combined systolic (congestive) and diastolic (congestive) heart failure: Secondary | ICD-10-CM | POA: Diagnosis present

## 2021-04-26 DIAGNOSIS — F02C0 Dementia in other diseases classified elsewhere, severe, without behavioral disturbance, psychotic disturbance, mood disturbance, and anxiety: Secondary | ICD-10-CM | POA: Diagnosis present

## 2021-04-26 DIAGNOSIS — Z7982 Long term (current) use of aspirin: Secondary | ICD-10-CM

## 2021-04-26 DIAGNOSIS — K439 Ventral hernia without obstruction or gangrene: Secondary | ICD-10-CM | POA: Diagnosis present

## 2021-04-26 DIAGNOSIS — Z8249 Family history of ischemic heart disease and other diseases of the circulatory system: Secondary | ICD-10-CM

## 2021-04-26 DIAGNOSIS — S72142A Displaced intertrochanteric fracture of left femur, initial encounter for closed fracture: Secondary | ICD-10-CM | POA: Diagnosis present

## 2021-04-26 DIAGNOSIS — Z20822 Contact with and (suspected) exposure to covid-19: Secondary | ICD-10-CM | POA: Diagnosis present

## 2021-04-26 DIAGNOSIS — M25562 Pain in left knee: Secondary | ICD-10-CM

## 2021-04-26 DIAGNOSIS — W19XXXA Unspecified fall, initial encounter: Secondary | ICD-10-CM | POA: Diagnosis not present

## 2021-04-26 DIAGNOSIS — G3183 Dementia with Lewy bodies: Secondary | ICD-10-CM | POA: Diagnosis present

## 2021-04-26 DIAGNOSIS — G8929 Other chronic pain: Secondary | ICD-10-CM | POA: Diagnosis present

## 2021-04-26 DIAGNOSIS — Z79891 Long term (current) use of opiate analgesic: Secondary | ICD-10-CM

## 2021-04-26 DIAGNOSIS — R918 Other nonspecific abnormal finding of lung field: Secondary | ICD-10-CM | POA: Diagnosis present

## 2021-04-26 DIAGNOSIS — Z923 Personal history of irradiation: Secondary | ICD-10-CM

## 2021-04-26 DIAGNOSIS — Z955 Presence of coronary angioplasty implant and graft: Secondary | ICD-10-CM

## 2021-04-26 DIAGNOSIS — Z85118 Personal history of other malignant neoplasm of bronchus and lung: Secondary | ICD-10-CM

## 2021-04-26 DIAGNOSIS — S22000A Wedge compression fracture of unspecified thoracic vertebra, initial encounter for closed fracture: Secondary | ICD-10-CM

## 2021-04-26 DIAGNOSIS — I255 Ischemic cardiomyopathy: Secondary | ICD-10-CM | POA: Diagnosis present

## 2021-04-26 DIAGNOSIS — I2510 Atherosclerotic heart disease of native coronary artery without angina pectoris: Secondary | ICD-10-CM | POA: Diagnosis present

## 2021-04-26 DIAGNOSIS — F0280 Dementia in other diseases classified elsewhere without behavioral disturbance: Secondary | ICD-10-CM | POA: Diagnosis not present

## 2021-04-26 DIAGNOSIS — I10 Essential (primary) hypertension: Secondary | ICD-10-CM | POA: Diagnosis not present

## 2021-04-26 DIAGNOSIS — Z7951 Long term (current) use of inhaled steroids: Secondary | ICD-10-CM

## 2021-04-26 DIAGNOSIS — Z9049 Acquired absence of other specified parts of digestive tract: Secondary | ICD-10-CM

## 2021-04-26 DIAGNOSIS — K4090 Unilateral inguinal hernia, without obstruction or gangrene, not specified as recurrent: Secondary | ICD-10-CM | POA: Diagnosis present

## 2021-04-26 DIAGNOSIS — Z419 Encounter for procedure for purposes other than remedying health state, unspecified: Secondary | ICD-10-CM | POA: Diagnosis not present

## 2021-04-26 DIAGNOSIS — Z7189 Other specified counseling: Secondary | ICD-10-CM | POA: Diagnosis not present

## 2021-04-26 DIAGNOSIS — Z515 Encounter for palliative care: Secondary | ICD-10-CM | POA: Diagnosis not present

## 2021-04-26 LAB — RESP PANEL BY RT-PCR (FLU A&B, COVID) ARPGX2
Influenza A by PCR: NEGATIVE
Influenza B by PCR: NEGATIVE
SARS Coronavirus 2 by RT PCR: NEGATIVE

## 2021-04-26 LAB — URINALYSIS, ROUTINE W REFLEX MICROSCOPIC
Bilirubin Urine: NEGATIVE
Glucose, UA: NEGATIVE mg/dL
Hgb urine dipstick: NEGATIVE
Ketones, ur: NEGATIVE mg/dL
Leukocytes,Ua: NEGATIVE
Nitrite: NEGATIVE
Protein, ur: NEGATIVE mg/dL
Specific Gravity, Urine: 1.01 (ref 1.005–1.030)
pH: 7.5 (ref 5.0–8.0)

## 2021-04-26 LAB — CBC WITH DIFFERENTIAL/PLATELET
Abs Immature Granulocytes: 0.03 10*3/uL (ref 0.00–0.07)
Basophils Absolute: 0 10*3/uL (ref 0.0–0.1)
Basophils Relative: 0 %
Eosinophils Absolute: 0 10*3/uL (ref 0.0–0.5)
Eosinophils Relative: 0 %
HCT: 32.1 % — ABNORMAL LOW (ref 36.0–46.0)
Hemoglobin: 9.3 g/dL — ABNORMAL LOW (ref 12.0–15.0)
Immature Granulocytes: 0 %
Lymphocytes Relative: 10 %
Lymphs Abs: 0.8 10*3/uL (ref 0.7–4.0)
MCH: 28.7 pg (ref 26.0–34.0)
MCHC: 29 g/dL — ABNORMAL LOW (ref 30.0–36.0)
MCV: 99.1 fL (ref 80.0–100.0)
Monocytes Absolute: 1.1 10*3/uL — ABNORMAL HIGH (ref 0.1–1.0)
Monocytes Relative: 14 %
Neutro Abs: 6.3 10*3/uL (ref 1.7–7.7)
Neutrophils Relative %: 76 %
Platelets: 264 10*3/uL (ref 150–400)
RBC: 3.24 MIL/uL — ABNORMAL LOW (ref 3.87–5.11)
RDW: 14.7 % (ref 11.5–15.5)
WBC: 8.3 10*3/uL (ref 4.0–10.5)
nRBC: 0 % (ref 0.0–0.2)

## 2021-04-26 LAB — COMPREHENSIVE METABOLIC PANEL
ALT: 19 U/L (ref 0–44)
AST: 22 U/L (ref 15–41)
Albumin: 3 g/dL — ABNORMAL LOW (ref 3.5–5.0)
Alkaline Phosphatase: 168 U/L — ABNORMAL HIGH (ref 38–126)
Anion gap: 6 (ref 5–15)
BUN: 40 mg/dL — ABNORMAL HIGH (ref 8–23)
CO2: 23 mmol/L (ref 22–32)
Calcium: 8.9 mg/dL (ref 8.9–10.3)
Chloride: 113 mmol/L — ABNORMAL HIGH (ref 98–111)
Creatinine, Ser: 1.23 mg/dL — ABNORMAL HIGH (ref 0.44–1.00)
GFR, Estimated: 44 mL/min — ABNORMAL LOW (ref >60)
Glucose, Bld: 120 mg/dL — ABNORMAL HIGH (ref 70–99)
Potassium: 4.3 mmol/L (ref 3.5–5.1)
Sodium: 142 mmol/L (ref 135–145)
Total Bilirubin: 0.5 mg/dL (ref 0.3–1.2)
Total Protein: 6.6 g/dL (ref 6.5–8.1)

## 2021-04-26 LAB — MAGNESIUM: Magnesium: 2.2 mg/dL (ref 1.7–2.4)

## 2021-04-26 LAB — CK: Total CK: 110 U/L (ref 38–234)

## 2021-04-26 LAB — LACTIC ACID, PLASMA: Lactic Acid, Venous: 1 mmol/L (ref 0.5–1.9)

## 2021-04-26 LAB — CBG MONITORING, ED: Glucose-Capillary: 98 mg/dL (ref 70–99)

## 2021-04-26 MED ORDER — METOPROLOL TARTRATE 5 MG/5ML IV SOLN
2.5000 mg | Freq: Four times a day (QID) | INTRAVENOUS | Status: DC
  Administered 2021-04-26 – 2021-05-02 (×20): 2.5 mg via INTRAVENOUS
  Filled 2021-04-26 (×20): qty 5

## 2021-04-26 MED ORDER — ADULT MULTIVITAMIN W/MINERALS CH
1.0000 | ORAL_TABLET | Freq: Every day | ORAL | Status: DC
  Administered 2021-04-26 – 2021-05-02 (×6): 1 via ORAL
  Filled 2021-04-26 (×6): qty 1

## 2021-04-26 MED ORDER — POLYETHYLENE GLYCOL 3350 17 G PO PACK: 17.0000 g | PACK | Freq: Every day | ORAL | Status: DC | PRN

## 2021-04-26 MED ORDER — FENTANYL CITRATE PF 50 MCG/ML IJ SOSY
50.0000 ug | PREFILLED_SYRINGE | Freq: Once | INTRAMUSCULAR | Status: AC
  Administered 2021-04-26: 50 ug via INTRAVENOUS
  Filled 2021-04-26: qty 1

## 2021-04-26 MED ORDER — DEXTROSE IN LACTATED RINGERS 5 % IV SOLN: INTRAVENOUS | Status: AC

## 2021-04-26 MED ORDER — MEMANTINE HCL 10 MG PO TABS
5.0000 mg | ORAL_TABLET | Freq: Every day | ORAL | Status: DC
  Administered 2021-04-26 – 2021-05-02 (×7): 5 mg via ORAL
  Filled 2021-04-26 (×7): qty 1

## 2021-04-26 MED ORDER — BISACODYL 5 MG PO TBEC: 5.0000 mg | DELAYED_RELEASE_TABLET | Freq: Every day | ORAL | Status: DC | PRN

## 2021-04-26 MED ORDER — GABAPENTIN 100 MG PO CAPS
100.0000 mg | ORAL_CAPSULE | Freq: Two times a day (BID) | ORAL | Status: DC
  Administered 2021-04-26 – 2021-05-02 (×12): 100 mg via ORAL
  Filled 2021-04-26 (×12): qty 1

## 2021-04-26 MED ORDER — IOHEXOL 300 MG/ML  SOLN
80.0000 mL | Freq: Once | INTRAMUSCULAR | Status: AC | PRN
  Administered 2021-04-26: 80 mL via INTRAVENOUS

## 2021-04-26 MED ORDER — IPRATROPIUM-ALBUTEROL 0.5-2.5 (3) MG/3ML IN SOLN: 3.0000 mL | RESPIRATORY_TRACT | Status: DC | PRN

## 2021-04-26 MED ORDER — DONEPEZIL HCL 10 MG PO TABS
10.0000 mg | ORAL_TABLET | Freq: Every day | ORAL | Status: DC
  Administered 2021-04-26 – 2021-05-01 (×6): 10 mg via ORAL
  Filled 2021-04-26 (×7): qty 1

## 2021-04-26 MED ORDER — ONDANSETRON HCL 4 MG PO TABS: 4.0000 mg | ORAL_TABLET | Freq: Four times a day (QID) | ORAL | Status: DC | PRN

## 2021-04-26 MED ORDER — MOMETASONE FURO-FORMOTEROL FUM 200-5 MCG/ACT IN AERO
2.0000 | INHALATION_SPRAY | Freq: Two times a day (BID) | RESPIRATORY_TRACT | Status: DC
  Administered 2021-04-26 – 2021-05-02 (×11): 2 via RESPIRATORY_TRACT
  Filled 2021-04-26 (×2): qty 8.8

## 2021-04-26 MED ORDER — FENTANYL CITRATE PF 50 MCG/ML IJ SOSY
12.5000 ug | PREFILLED_SYRINGE | INTRAMUSCULAR | Status: DC | PRN
  Administered 2021-04-26 – 2021-04-27 (×3): 25 ug via INTRAVENOUS
  Filled 2021-04-26 (×3): qty 1

## 2021-04-26 MED ORDER — QUETIAPINE FUMARATE 25 MG PO TABS
50.0000 mg | ORAL_TABLET | Freq: Every day | ORAL | Status: DC
  Administered 2021-04-26 – 2021-05-01 (×6): 50 mg via ORAL
  Filled 2021-04-26 (×3): qty 2
  Filled 2021-04-26: qty 1
  Filled 2021-04-26 (×2): qty 2
  Filled 2021-04-26: qty 1

## 2021-04-26 MED ORDER — ONDANSETRON HCL 4 MG/2ML IJ SOLN: 4.0000 mg | Freq: Four times a day (QID) | INTRAMUSCULAR | Status: DC | PRN

## 2021-04-26 MED ORDER — METHOCARBAMOL 1000 MG/10ML IJ SOLN
500.0000 mg | Freq: Three times a day (TID) | INTRAVENOUS | Status: DC | PRN
  Filled 2021-04-26: qty 5

## 2021-04-26 MED ORDER — PANTOPRAZOLE SODIUM 40 MG PO TBEC
40.0000 mg | DELAYED_RELEASE_TABLET | Freq: Two times a day (BID) | ORAL | Status: DC
  Administered 2021-04-26 – 2021-05-02 (×12): 40 mg via ORAL
  Filled 2021-04-26 (×12): qty 1

## 2021-04-26 MED ORDER — BUSPIRONE HCL 5 MG PO TABS
5.0000 mg | ORAL_TABLET | Freq: Two times a day (BID) | ORAL | Status: DC
  Administered 2021-04-26 – 2021-05-02 (×12): 5 mg via ORAL
  Filled 2021-04-26 (×12): qty 1

## 2021-04-26 MED ORDER — VITAMIN D 25 MCG (1000 UNIT) PO TABS
1000.0000 [IU] | ORAL_TABLET | Freq: Every day | ORAL | Status: DC
Start: 2021-04-26 — End: 2021-05-03
  Administered 2021-04-26 – 2021-05-02 (×6): 1000 [IU] via ORAL
  Filled 2021-04-26 (×6): qty 1

## 2021-04-26 NOTE — ED Notes (Signed)
Patient transported to CT 

## 2021-04-26 NOTE — ED Notes (Signed)
Ortho reports L hip surgery tomorrow, if cleared.

## 2021-04-26 NOTE — ED Notes (Signed)
Patient is resting comfortably. 

## 2021-04-26 NOTE — ED Notes (Signed)
X-ray at bedside

## 2021-04-26 NOTE — ED Provider Notes (Signed)
[facility] EMERGENCY DEPARTMENT Provider Note   CSN: [id number] Arrival date & time: 04/26/21  1233     History Chief Complaint  Patient presents with   Fall   Hip Pain   Headache    Sara Fox is a 80 y.o. female.  80 year old female with history of CHF, COPD, Alzheimer's, brought in by EMS from home, history per EMS/son (son not at bedside), found down at home on the floor by son. Reported left hip pain and headache on the right side of her head. Incontinent of urine with EMS.  Level 5 caveat applies.       Past Medical History:  Diagnosis Date   Acute kidney injury ([place])    Back pain    Cervical cancer (HCC)    CHF (congestive heart failure) ([place])    Closed fracture of left distal radius 05/28/2017   COPD (chronic obstructive pulmonary disease) (HCC)    Coronary artery disease    s/p DES x2 to RCA 2016   GERD (gastroesophageal reflux disease)    History of radiation therapy 07/27/20-08/16/20   Right lung- SBRT- Dr. Gery Pray    Hyperlipidemia    Hypertension    Lower leg edema 12/21/2016   Mild cognitive impairment    Myocardial infarction [facility])    Neck pain    Rhabdomyolysis    Stomach problems     Patient Active Problem List   Diagnosis Date Noted   Gait disturbance 02/27/2021   GERD (gastroesophageal reflux disease) 02/27/2021   Ambulatory dysfunction 02/27/2021   Transaminitis 02/27/2021   Fall (on)(from) incline, initial encounter 11/23/2020   Macrocytosis without anemia 11/22/2020   Unwitnessed fall 11/22/2020   Late onset Alzheimer's dementia with behavioral disturbance ([place]) 11/18/2020   Visual hallucination 11/18/2020   Sundowning 11/18/2020   Non-small cell carcinoma of right lung, stage 1 ([place]) 06/08/2020   Pulmonary nodule 1 cm or greater in diameter 05/21/2020   Atherosclerotic heart disease of native coronary artery with other forms of angina pectoris ([place]) 04/22/2020   Memory loss 02/18/2019   Vitamin B12 deficiency  10/23/2018   Stage 3 chronic kidney disease ([place]) 06/03/2018   Chronic anemia 06/03/2018   Hypercholesterolemia 06/03/2018   Malnutrition of moderate degree 07/12/2017   Hypoalbuminemia 07/11/2017   Elevated LFTs 07/11/2017   Snoring 12/21/2016   Peripheral musculoskeletal gait disorder 02/05/2015   Lumbar post-laminectomy syndrome 02/05/2015   CAD in native artery 10/16/2014   NSTEMI (non-ST elevated myocardial infarction) ([place]) 09/13/2014   Cardiomyopathy, ischemic 95/62/1308   Chronic systolic heart failure ([place])    Tobacco abuse 09/12/2014   Dyslipidemia 09/12/2014   Normocytic anemia 09/12/2014   Chronic diastolic heart failure, NYHA class 1 ([place]) 09/12/2014   Postlaminectomy syndrome, cervical region 08/01/2013   Chronic midline low back pain with left-sided sciatica 08/01/2013   Shoulder joint contracture 08/01/2013   HTN (hypertension) 03/03/2013   Abnormal genetic test 03/03/2013    Past Surgical History:  Procedure Laterality Date   ABDOMINAL HYSTERECTOMY     BACK SURGERY  2012   lower back   BRONCHIAL BIOPSY  05/25/2020   Procedure: BRONCHIAL BIOPSIES;  Surgeon: Collene Gobble, MD;  Location: [facility] ENDOSCOPY;  Service: Pulmonary;;   BRONCHIAL BRUSHINGS  05/25/2020   Procedure: BRONCHIAL BRUSHINGS;  Surgeon: Collene Gobble, MD;  Location: [facility] ENDOSCOPY;  Service: Pulmonary;;   BRONCHIAL NEEDLE ASPIRATION BIOPSY  05/25/2020   Procedure: BRONCHIAL NEEDLE ASPIRATION BIOPSIES;  Surgeon: Collene Gobble, MD;  Location: [place] ENDOSCOPY;  Service: Pulmonary;;   CARDIAC CATHETERIZATION  06/1999   noncritical disease invovling PDA   CARDIAC CATHETERIZATION N/A 09/14/2014   Procedure: Left Heart Cath and Coronary Angiography;  Surgeon: Leonie Man, MD;  Location: [place] INVASIVE CV LAB CUPID;  Service: Cardiovascular;  Laterality: N/A;   CHOLECYSTECTOMY     ESOPHAGOGASTRODUODENOSCOPY (EGD) WITH PROPOFOL Left 07/19/2017   Procedure: ESOPHAGOGASTRODUODENOSCOPY (EGD) WITH PROPOFOL;   Surgeon: Ronnette Juniper, MD;  Location: [facility] ENDOSCOPY;  Service: Gastroenterology;  Laterality: Left;   FINE NEEDLE ASPIRATION  05/25/2020   Procedure: FINE NEEDLE ASPIRATION (FNA) LINEAR;  Surgeon: Collene Gobble, MD;  Location: [facility] ENDOSCOPY;  Service: Pulmonary;;   FRACTURE SURGERY Left 05/2017   left wrist   HARDWARE REMOVAL Left 11/07/2017   Procedure: LEFT WRIST HARDWARE REMOVAL;  Surgeon: Charlotte Crumb, MD;  Location: [place];  Service: Orthopedics;  Laterality: Left;   KNEE SURGERY Bilateral 2001 & 2007   NECK SURGERY  2012   2012   PARTIAL GASTRECTOMY  2005   subtotal   PERCUTANEOUS CORONARY STENT INTERVENTION (PCI-S)  09/14/2014   Procedure: Percutaneous Coronary Stent Intervention (Pci-S);  Surgeon: Leonie Man, MD;  Location: [facility] INVASIVE CV LAB CUPID;  Service: Cardiovascular;;   SHOULDER SURGERY Right    TRANSTHORACIC ECHOCARDIOGRAM  06/02/2010   EF=>55%, normal LV systolic function; normal RV systolic function; mild mitral annular calcif; trace TR; AV mildly sclerotic   VIDEO BRONCHOSCOPY WITH ENDOBRONCHIAL NAVIGATION N/A 05/25/2020   Procedure: VIDEO BRONCHOSCOPY WITH ENDOBRONCHIAL NAVIGATION;  Surgeon: Collene Gobble, MD;  Location: [place] ENDOSCOPY;  Service: Pulmonary;  Laterality: N/A;   VIDEO BRONCHOSCOPY WITH ENDOBRONCHIAL ULTRASOUND N/A 05/25/2020   Procedure: VIDEO BRONCHOSCOPY WITH ENDOBRONCHIAL ULTRASOUND;  Surgeon: Collene Gobble, MD;  Location: [place] ENDOSCOPY;  Service: Pulmonary;  Laterality: N/A;   WRIST OSTEOTOMY Left 11/07/2017   Procedure: LEFT WRIST DISTAL ULNA RESECTION WITH EXTENSOR CARPI ULNARIS STABILIZATION;  Surgeon: Charlotte Crumb, MD;  Location: [place];  Service: Orthopedics;  Laterality: Left;     OB History   No obstetric history on file.     Family History  Problem Relation Age of Onset   Heart disease Mother        also HTN   Diabetes Mother    Colon cancer Mother    Heart disease Brother        deceased at 53   Hypertension Brother    Heart  attack Brother    Heart disease Brother    Hypertension Brother     Social History   Tobacco Use   Smoking status: Every Day    Packs/day: 0.50    Years: 60.00    Pack years: 30.00    Types: Cigarettes   Smokeless tobacco: Never   Tobacco comments:    currently smokes 1/2 ppd or less  Vaping Use   Vaping Use: Former  Substance Use Topics   Alcohol use: No   Drug use: No    Home Medications Prior to Admission medications   Medication Sig Start Date End Date Taking? Authorizing Provider  aspirin EC 81 MG tablet Take 81 mg by mouth daily as needed (headache).   Yes [provider]  budesonide-formoterol (SYMBICORT) 160-4.5 MCG/ACT inhaler TAKE 2 PUFFS BY MOUTH TWICE A DAY Patient taking differently: Inhale 2 puffs into the lungs in the morning and at bedtime. 12/16/20  Yes [name], FNP  busPIRone (BUSPAR) 5 MG tablet Take 5 mg by mouth 2 (two) times daily  as needed. 04/13/21  Yes [provider]  Calcium Citrate-Vitamin D (CALCIUM CITRATE+D3 PETITES PO) Take 1 tablet by mouth daily.   Yes [provider]  cholecalciferol (VITAMIN D) 25 MCG (1000 UNIT) tablet Take 1,000 Units by mouth daily.   Yes [provider]  ciclopirox (PENLAC) 8 % solution Apply 1 application topically at bedtime. Apply over nail and surrounding skin. Apply daily over previous coat. After seven (7) days, file nail and continue cycle. 05/25/20  Yes Byrum, Rose Fillers, MD  [employer] MAGNESIUM OXIDE 250 MG TABS TAKE 1 TABLET BY MOUTH WITH EVENING MEAL DAILY Patient taking differently: Take 250 mg by mouth every evening. 01/10/21  Yes [name], FNP  donepezil (ARICEPT) 10 MG tablet Take 1 tablet (10 mg total) by mouth at bedtime. 12/16/20  Yes [name], FNP  ferrous sulfate 325 (65 FE) MG tablet Take 325 mg by mouth daily with breakfast.   Yes [provider]  furosemide (LASIX) 20 MG tablet Take 1 tablet by mouth Mon - Fri daily Patient taking differently: Take 20 mg  by mouth See admin instructions. Monday-friday 02/02/21  Yes [name], FNP  gabapentin (NEURONTIN) 100 MG capsule Take 1 capsule (100 mg total) by mouth 2 (two) times daily. 12/16/20  Yes [name], FNP  memantine (NAMENDA) 5 MG tablet Take 1 tablet (5 mg total) by mouth daily. 12/16/20  Yes [name], FNP  metoprolol tartrate (LOPRESSOR) 25 MG tablet Take 1 tablet (25 mg total) by mouth 2 (two) times daily. 12/17/20  Yes Hilty, Nadean Corwin, MD  Multiple Vitamin (MULTIVITAMIN WITH MINERALS) TABS tablet Take 1 tablet by mouth daily.   Yes [provider]  Omega-3 Fatty Acids (FISH OIL PO) Take 1 capsule by mouth daily.   Yes [provider]  omeprazole (PRILOSEC) 20 MG capsule Take 20 mg by mouth 2 (two) times daily. 03/19/21  Yes [provider]  oxybutynin (DITROPAN) 5 MG tablet Take 0.5 tablets (2.5 mg total) by mouth 2 (two) times daily. 11/12/20  Yes [name], FNP  pantoprazole (PROTONIX) 40 MG tablet Take 1 tablet (40 mg total) by mouth 2 (two) times daily. 12/16/20  Yes [name], FNP  polyethylene glycol (MIRALAX / GLYCOLAX) packet Take 17 g by mouth daily as needed for mild constipation.   Yes [provider]  Potassium Chloride ER 20 MEQ TBCR Take 2 tablets by mouth every morning. Patient taking differently: Take 40 mEq by mouth every morning. 12/17/20  Yes Hilty, Nadean Corwin, MD  QUEtiapine (SEROQUEL) 50 MG tablet TAKE 1 TABLET BY MOUTH EVERYDAY AT BEDTIME Patient taking differently: Take 50 mg by mouth at bedtime. 12/16/20  Yes [name], FNP  traMADol (ULTRAM) 50 MG tablet TAKE 1 TABLET 3 TIMES A DAY AND CAN TAKE 1 EXTRA 20 DAYS/MONTH Patient taking differently: Take 50 mg by mouth in the morning, at noon, and at bedtime. 01/31/21  Yes [name], FNP  nitroGLYCERIN (NITROSTAT) 0.4 MG SL tablet PLACE ONE TABLET UNDER THE TONGUE EVERY 5 MINUTES AS NEEDED FOR CHEST PAIN. Patient not taking: Reported on 04/26/2021 12/17/20   Pixie Casino, MD   rosuvastatin (CRESTOR) 40 MG tablet Take 1 tablet (40 mg total) by mouth daily. Patient not taking: Reported on 04/26/2021 12/17/20   Pixie Casino, MD    Allergies    Buprenorphine hcl, Morphine and related, Celebrex [celecoxib], Vioxx [rofecoxib], and Codeine  Review of Systems   Review of Systems  Unable to perform ROS: Dementia  Physical Exam Updated Vital Signs BP (!) 146/122    Pulse 72    Temp 97.7 F (36.5 C) (Oral)    Resp 13    SpO2 96%   Physical Exam Vitals and nursing note reviewed.  HENT:     Head: Normocephalic and atraumatic.     Mouth/Throat:     Mouth: Mucous membranes are dry.  Eyes:     Extraocular Movements: Extraocular movements intact.     Pupils: Pupils are equal, round, and reactive to light.  Neck:     Comments: C-collar in place Cardiovascular:     Rate and Rhythm: Normal rate and regular rhythm.     Pulses: Normal pulses.     Heart sounds: Normal heart sounds.  Pulmonary:     Effort: Pulmonary effort is normal.     Breath sounds: Normal breath sounds.  Abdominal:     Palpations: Abdomen is soft.     Tenderness: There is no abdominal tenderness.     Hernia: A hernia is present. Hernia is present in the ventral area and left inguinal area.     Comments: Ventral and left inguinal hernias are soft, non tender, easily reduced.   Musculoskeletal:     Right lower leg: Edema present.     Left lower leg: Edema present.     Comments: Left leg externally rotated and shortened.  No pain with ROM right leg.  No pain with palpation, ROM upper extremities.   Skin:    General: Skin is warm and dry.     Findings: No erythema or rash.  Neurological:     Mental Status: She is alert.     GCS: GCS eye subscore is 4. GCS verbal subscore is 4. GCS motor subscore is 6.     Comments: Alert to name/DOB, location. Follows commands.     ED Results / Procedures / Treatments   Labs (all labs ordered are listed, but only abnormal results are displayed) Labs  Reviewed  COMPREHENSIVE METABOLIC PANEL - Abnormal; Notable for the following components:      Result Value   Chloride 113 (*)    Glucose, Bld 120 (*)    BUN 40 (*)    Creatinine, Ser 1.23 (*)    Albumin 3.0 (*)    Alkaline Phosphatase 168 (*)    GFR, Estimated 44 (*)    All other components within normal limits  CBC WITH DIFFERENTIAL/PLATELET - Abnormal; Notable for the following components:   RBC 3.24 (*)    Hemoglobin 9.3 (*)    HCT 32.1 (*)    MCHC 29.0 (*)    Monocytes Absolute 1.1 (*)    All other components within normal limits  RESP PANEL BY RT-PCR (FLU A&B, COVID) ARPGX2  CULTURE, BLOOD (ROUTINE X 2)  CULTURE, BLOOD (ROUTINE X 2)  LACTIC ACID, PLASMA  MAGNESIUM  CK  URINALYSIS, ROUTINE W REFLEX MICROSCOPIC  CBG MONITORING, ED    EKG None  Radiology CT Head Wo Contrast  Result Date: 04/26/2021 CLINICAL DATA:  Neck trauma (Age >= 65y); Head trauma, minor (Age >= 65y) EXAM: CT HEAD WITHOUT CONTRAST CT CERVICAL SPINE WITHOUT CONTRAST TECHNIQUE: Multidetector CT imaging of the head and cervical spine was performed following the standard protocol without intravenous contrast. Multiplanar CT image reconstructions of the cervical spine were also generated. COMPARISON:  CT head February 26, 2021. CT cervical spine November 22, 2020. FINDINGS: CT HEAD FINDINGS Brain: No evidence of acute large vascular infarction, hemorrhage, hydrocephalus, extra-axial collection  or mass lesion/mass effect. Similar patchy white matter hypoattenuation, nonspecific but compatible with chronic microvascular ischemic disease. Vascular: No hyperdense vessel identified. Calcific intracranial atherosclerosis. Skull: No acute fracture. Sinuses/Orbits: Opacified left anterior ethmoid air cell. Otherwise, largely clear sinuses. Unremarkable orbits. Other: No mastoid effusions. CT CERVICAL SPINE FINDINGS Motion limited study.  Within this limitation: Alignment: Similar alignment to the prior. Similar  anterolisthesis of C6 on C7 with similar focal kyphosis at this level. Of the lower cervical spine dextrocurvature Skull base and vertebrae: Redemonstrated chronic erosions along the base of the odontoid with posterior pannus. There is also severe erosive change along the right atlantoaxial joint, similar. Cystic degenerative change of the dens, similar. C3-C5 ACDF with corpectomy at C4. No evidence of hardware fracture. Similar alignment. Fused facet joints at C3-C4 and C6-C7. New T2 (20%) and T3 (30%) vertebral body height loss with associated superior endplate sclerosis and lucency through the T3 superior endplate. Findings are partially imaged. Soft tissues and spinal canal: Motion limited evaluation without definite prevertebral edema or visible canal hematoma. Disc levels: Severe multilevel degenerative change. Craniocervical degenerative changes described above. Multilevel bilateral foraminal stenosis. Upper chest: Partially imaged nodular opacities in the posterior right upper lobe, reportedly biopsy-proven non-small cell lung cancer IMPRESSION: CT cervical spine: 1. Acute T2 and T3 compression fractures, described above and partially imaged. Recommend dedicated CT of the thoracic spine to further characterize and to fully evaluate the thoracic spine. 2. Similar postoperative changes of C3-C4 ACDF and C4 corpectomy with similar alignment. 3. Severe multilevel degenerative change, including severe craniocervical degenerative change and multilevel foraminal stenosis. MRI could further characterize the canal/cord/foramina if clinically indicated. 4. Partially imaged nodular opacities in the posterior right upper lobe, reportedly biopsy-proven non-small cell lung cancer CT head: No evidence of acute intracranial abnormality. Electronically Signed   By: Margaretha Sheffield M.D.   On: 04/26/2021 14:41   CT Cervical Spine Wo Contrast  Result Date: 04/26/2021 CLINICAL DATA:  Neck trauma (Age >= 65y); Head trauma,  minor (Age >= 65y) EXAM: CT HEAD WITHOUT CONTRAST CT CERVICAL SPINE WITHOUT CONTRAST TECHNIQUE: Multidetector CT imaging of the head and cervical spine was performed following the standard protocol without intravenous contrast. Multiplanar CT image reconstructions of the cervical spine were also generated. COMPARISON:  CT head February 26, 2021. CT cervical spine November 22, 2020. FINDINGS: CT HEAD FINDINGS Brain: No evidence of acute large vascular infarction, hemorrhage, hydrocephalus, extra-axial collection or mass lesion/mass effect. Similar patchy white matter hypoattenuation, nonspecific but compatible with chronic microvascular ischemic disease. Vascular: No hyperdense vessel identified. Calcific intracranial atherosclerosis. Skull: No acute fracture. Sinuses/Orbits: Opacified left anterior ethmoid air cell. Otherwise, largely clear sinuses. Unremarkable orbits. Other: No mastoid effusions. CT CERVICAL SPINE FINDINGS Motion limited study.  Within this limitation: Alignment: Similar alignment to the prior. Similar anterolisthesis of C6 on C7 with similar focal kyphosis at this level. Of the lower cervical spine dextrocurvature Skull base and vertebrae: Redemonstrated chronic erosions along the base of the odontoid with posterior pannus. There is also severe erosive change along the right atlantoaxial joint, similar. Cystic degenerative change of the dens, similar. C3-C5 ACDF with corpectomy at C4. No evidence of hardware fracture. Similar alignment. Fused facet joints at C3-C4 and C6-C7. New T2 (20%) and T3 (30%) vertebral body height loss with associated superior endplate sclerosis and lucency through the T3 superior endplate. Findings are partially imaged. Soft tissues and spinal canal: Motion limited evaluation without definite prevertebral edema or visible canal hematoma. Disc levels: Severe multilevel degenerative  change. Craniocervical degenerative changes described above. Multilevel bilateral foraminal  stenosis. Upper chest: Partially imaged nodular opacities in the posterior right upper lobe, reportedly biopsy-proven non-small cell lung cancer IMPRESSION: CT cervical spine: 1. Acute T2 and T3 compression fractures, described above and partially imaged. Recommend dedicated CT of the thoracic spine to further characterize and to fully evaluate the thoracic spine. 2. Similar postoperative changes of C3-C4 ACDF and C4 corpectomy with similar alignment. 3. Severe multilevel degenerative change, including severe craniocervical degenerative change and multilevel foraminal stenosis. MRI could further characterize the canal/cord/foramina if clinically indicated. 4. Partially imaged nodular opacities in the posterior right upper lobe, reportedly biopsy-proven non-small cell lung cancer CT head: No evidence of acute intracranial abnormality. Electronically Signed   By: Margaretha Sheffield M.D.   On: 04/26/2021 14:41    Procedures Procedures   Medications Ordered in ED Medications  fentaNYL (SUBLIMAZE) injection 50 mcg (50 mcg Intravenous Given 04/26/21 1337)    ED Course  I have reviewed the triage vital signs and the nursing notes.  Pertinent labs & imaging results that were available during my care of the patient were reviewed by me and considered in my medical decision making (see chart for details).  Clinical Course as of 04/26/21 1510  Tue Dec 13, 287  [id number] 80 year old female brought in by EMS after found on the ground today, lives with son, unknown down time. Found to have left hip deformity with hip pain. Also reports right side head pain. Not on blood thinners.  Follows simple commands, alert to person and DOB.  [LM]  1328 Son arrives, last saw patient at 1am sleeping in the desk chair, found at 11:20am on the floor on her side complaining of hip and head pain. Patient ambulates with a walker at baseline.  [LM]  1408 CT C-spine found to have acute compression fracture T2 and T3, will add on  dedicated to the entire spine CT imaging.  CT head without acute injury. CK is normal, doubt rhabdo.  CMP with baseline anemia with hemoglobin of 9.3.  CMP without significant change from baseline however creatinine 1.23 today.  Magnesium normal.  Initial lactic reassuring at 1.0. COVID and flu negative. [LM]  [place] signed out to oncoming provider pending imaging, consult with ortho, possibly neurosurgery and likely admission.  [LM]    Clinical Course User Index [LM] Roque Lias   MDM Rules/Calculators/A&P                           Final Clinical Impression(s) / ED Diagnoses Final diagnoses:  Fall, initial encounter  Compression fracture of body of thoracic vertebra ([place])  Closed fracture of left hip, initial encounter [facility])    Rx / DC Orders ED Discharge Orders     None        Tacy Learn, PA-C 04/26/21 1511    Carmin Muskrat, MD 04/27/21 1340

## 2021-04-26 NOTE — ED Notes (Signed)
Pt incontinent, sheets saturated in urine. Pt cleaned with new linens. Pain medication given

## 2021-04-26 NOTE — ED Notes (Signed)
CT notified of Pt's availability.

## 2021-04-26 NOTE — ED Triage Notes (Signed)
Per EMS, Pt, from home, presents after an unwitnessed fall.  Pt c/o L hip and R side head pain.  Rotation noted.  Unknown shortening as she will not straighten R leg.  Hx of dementia.  EMS reports Pt lives w/ her son and he found her in the floor this morning.  Pt sleeps in an office chair.  Per EMS, poor living conditions (ie. Roaches on the wall and Pt has dried feces on her feet.)

## 2021-04-26 NOTE — ED Notes (Signed)
Pts bedding was replaced with fresh clean sheets

## 2021-04-26 NOTE — ED Notes (Signed)
Pt's son reports she has not had any of her medications today and can take them w/ juice.

## 2021-04-26 NOTE — H&P (Signed)
History and Physical    Sara Fox XLK:[id number] DOB: 1941-05-03 DOA: 04/26/2021  PCP: [facility] Patient coming from: Home.   Chief Complaint: Fall, hip pain and headache  HPI: Sara Fox is a 80 y.o. female with history of Alzheimer's dementia, chronic back pain, ambulatory dysfunction, combined CHF, CAD, COPD, OSA, CKD-3A, stage IV lung cancer s/p SBRT, sacral decubitus and recent hospitalization from 10/15-2010/22 with ambulatory dysfunction brought to ED by EMS after found down at home by his son for unknown period of time.  Patient with advanced dementia.  She is awake and oriented to self, city and state but not able to provide history.  History obtained from patient's son over the phone and chart review.  Per patient's son, patient was in his usual state of health until 4 AM when he went to bed, and found her down on the floor about 11 AM when he woke up, and called EMS.  Per patient's son, no report of recent illness, fever, chills, GI or UTI symptoms.  She has dementia.  She does not use a walker consistently.  Has to be reminded.  Patient's son denies new medication.   In ED, hemodynamically stable.  Hgb 9.3 (baseline). Cr 1.23 (baseline 0.9-1.0).  BUN 40.  Otherwise, CBC and CMP without significant finding.  COVID-19 and influenza PCR negative.  CT head, CXR and right knee x-ray without acute finding. CT cervical spine with acute T2 and T3 compression fracture with about 20% and 30% height loss respectively.  Hip x-ray and CT chest abdomen and pelvis with acute left hip fracture and right pubic rami fracture.  Orthopedic surgery consulted.  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 Orthopedic surgery planning surgical treatment tomorrow.  Patient received IV fentanyl in ED.  ROS Not able to perform  review of systems due to patient's mental status.  PMH Past Medical History:  Diagnosis Date   Acute kidney injury ([place])    Back pain    Cervical cancer (HCC)    CHF (congestive heart failure) (HCC)    Closed fracture of left distal radius 05/28/2017   COPD (chronic obstructive pulmonary disease) (HCC)    Coronary artery disease    s/p DES x2 to RCA 2016   GERD (gastroesophageal reflux disease)    History of radiation therapy 07/27/20-08/16/20   Right lung- SBRT- Dr. Gery Pray    Hyperlipidemia    Hypertension    Lower leg edema 12/21/2016   Mild cognitive impairment    Myocardial infarction [facility])    Neck pain    Rhabdomyolysis    Stomach problems    PSH Past Surgical History:  Procedure Laterality Date   ABDOMINAL HYSTERECTOMY     BACK SURGERY  2012   lower back   BRONCHIAL BIOPSY  05/25/2020   Procedure: BRONCHIAL BIOPSIES;  Surgeon: Collene Gobble, MD;  Location: [facility] ENDOSCOPY;  Service: Pulmonary;;   BRONCHIAL BRUSHINGS  05/25/2020   Procedure: BRONCHIAL BRUSHINGS;  Surgeon: Collene Gobble, MD;  Location: [facility] ENDOSCOPY;  Service: Pulmonary;;   BRONCHIAL NEEDLE ASPIRATION BIOPSY  05/25/2020   Procedure: BRONCHIAL NEEDLE ASPIRATION BIOPSIES;  Surgeon: Collene Gobble, MD;  Location: [facility] ENDOSCOPY;  Service: Pulmonary;;   CARDIAC CATHETERIZATION  06/1999   noncritical disease invovling PDA   CARDIAC CATHETERIZATION N/A 09/14/2014  Procedure: Left Heart Cath and Coronary Angiography;  Surgeon: Leonie Man, MD;  Location: [facility] INVASIVE CV LAB CUPID;  Service: Cardiovascular;  Laterality: N/A;   CHOLECYSTECTOMY     ESOPHAGOGASTRODUODENOSCOPY (EGD) WITH PROPOFOL Left 07/19/2017   Procedure: ESOPHAGOGASTRODUODENOSCOPY (EGD) WITH PROPOFOL;  Surgeon: Ronnette Juniper, MD;  Location: [facility] ENDOSCOPY;  Service: Gastroenterology;  Laterality: Left;   FINE NEEDLE ASPIRATION  05/25/2020   Procedure: FINE NEEDLE ASPIRATION (FNA) LINEAR;  Surgeon: Collene Gobble, MD;  Location: [facility] ENDOSCOPY;  Service:  Pulmonary;;   FRACTURE SURGERY Left 05/2017   left wrist   HARDWARE REMOVAL Left 11/07/2017   Procedure: LEFT WRIST HARDWARE REMOVAL;  Surgeon: Charlotte Crumb, MD;  Location: [place];  Service: Orthopedics;  Laterality: Left;   KNEE SURGERY Bilateral 2001 & 2007   NECK SURGERY  2012   2012   PARTIAL GASTRECTOMY  2005   subtotal   PERCUTANEOUS CORONARY STENT INTERVENTION (PCI-S)  09/14/2014   Procedure: Percutaneous Coronary Stent Intervention (Pci-S);  Surgeon: Leonie Man, MD;  Location: [facility] INVASIVE CV LAB CUPID;  Service: Cardiovascular;;   SHOULDER SURGERY Right    TRANSTHORACIC ECHOCARDIOGRAM  06/02/2010   EF=>55%, normal LV systolic function; normal RV systolic function; mild mitral annular calcif; trace TR; AV mildly sclerotic   VIDEO BRONCHOSCOPY WITH ENDOBRONCHIAL NAVIGATION N/A 05/25/2020   Procedure: VIDEO BRONCHOSCOPY WITH ENDOBRONCHIAL NAVIGATION;  Surgeon: Collene Gobble, MD;  Location: [place] ENDOSCOPY;  Service: Pulmonary;  Laterality: N/A;   VIDEO BRONCHOSCOPY WITH ENDOBRONCHIAL ULTRASOUND N/A 05/25/2020   Procedure: VIDEO BRONCHOSCOPY WITH ENDOBRONCHIAL ULTRASOUND;  Surgeon: Collene Gobble, MD;  Location: [place] ENDOSCOPY;  Service: Pulmonary;  Laterality: N/A;   WRIST OSTEOTOMY Left 11/07/2017   Procedure: LEFT WRIST DISTAL ULNA RESECTION WITH EXTENSOR CARPI ULNARIS STABILIZATION;  Surgeon: Charlotte Crumb, MD;  Location: [place];  Service: Orthopedics;  Laterality: Left;   Fam HX Family History  Problem Relation Age of Onset   Heart disease Mother        also HTN   Diabetes Mother    Colon cancer Mother    Heart disease Brother        deceased at 51   Hypertension Brother    Heart attack Brother    Heart disease Brother    Hypertension Brother     Social Hx  reports that she has been smoking cigarettes. She has a 30.00 pack-year smoking history. She has never used smokeless tobacco. She reports that she does not drink alcohol and does not use  drugs.  Allergy Allergies  Allergen Reactions   Buprenorphine Hcl Shortness Of Breath    Throat swelling/trouble breathing and lethargic   Morphine And Related Shortness Of Breath    Throat swelling/trouble breathing and lethargic   Celebrex [Celecoxib] Diarrhea and Swelling    Swelling of legs    Vioxx [Rofecoxib] Palpitations   Codeine Other (See Comments)    Bloated    Home Meds Prior to Admission medications   Medication Sig Start Date End Date Taking? Authorizing Provider  aspirin EC 81 MG tablet Take 81 mg by mouth daily as needed (headache).   Yes [provider]  budesonide-formoterol (SYMBICORT) 160-4.5 MCG/ACT inhaler TAKE 2 PUFFS BY MOUTH TWICE A DAY Patient taking differently: Inhale 2 puffs into the lungs in the morning and at bedtime. 12/16/20  Yes [name], FNP  busPIRone (BUSPAR) 5 MG tablet Take 5 mg by mouth 2 (two) times daily as needed. 04/13/21  Yes [provider]  Calcium  Citrate-Vitamin D (CALCIUM CITRATE+D3 PETITES PO) Take 1 tablet by mouth daily.   Yes [provider]  cholecalciferol (VITAMIN D) 25 MCG (1000 UNIT) tablet Take 1,000 Units by mouth daily.   Yes [provider]  ciclopirox (PENLAC) 8 % solution Apply 1 application topically at bedtime. Apply over nail and surrounding skin. Apply daily over previous coat. After seven (7) days, file nail and continue cycle. 05/25/20  Yes Byrum, Rose Fillers, MD  [employer] MAGNESIUM OXIDE 250 MG TABS TAKE 1 TABLET BY MOUTH WITH EVENING MEAL DAILY Patient taking differently: Take 250 mg by mouth every evening. 01/10/21  Yes [name], FNP  donepezil (ARICEPT) 10 MG tablet Take 1 tablet (10 mg total) by mouth at bedtime. 12/16/20  Yes [name], FNP  ferrous sulfate 325 (65 FE) MG tablet Take 325 mg by mouth daily with breakfast.   Yes [provider]  furosemide (LASIX) 20 MG tablet Take 1 tablet by mouth Mon - Fri daily Patient taking differently: Take 20 mg by mouth See  admin instructions. Monday-friday 02/02/21  Yes [name], FNP  gabapentin (NEURONTIN) 100 MG capsule Take 1 capsule (100 mg total) by mouth 2 (two) times daily. 12/16/20  Yes [name], FNP  memantine (NAMENDA) 5 MG tablet Take 1 tablet (5 mg total) by mouth daily. 12/16/20  Yes [name], FNP  metoprolol tartrate (LOPRESSOR) 25 MG tablet Take 1 tablet (25 mg total) by mouth 2 (two) times daily. 12/17/20  Yes Hilty, Nadean Corwin, MD  Multiple Vitamin (MULTIVITAMIN WITH MINERALS) TABS tablet Take 1 tablet by mouth daily.   Yes [provider]  Omega-3 Fatty Acids (FISH OIL PO) Take 1 capsule by mouth daily.   Yes [provider]  omeprazole (PRILOSEC) 20 MG capsule Take 20 mg by mouth 2 (two) times daily. 03/19/21  Yes [provider]  oxybutynin (DITROPAN) 5 MG tablet Take 0.5 tablets (2.5 mg total) by mouth 2 (two) times daily. 11/12/20  Yes [name], FNP  pantoprazole (PROTONIX) 40 MG tablet Take 1 tablet (40 mg total) by mouth 2 (two) times daily. 12/16/20  Yes [name], FNP  polyethylene glycol (MIRALAX / GLYCOLAX) packet Take 17 g by mouth daily as needed for mild constipation.   Yes [provider]  Potassium Chloride ER 20 MEQ TBCR Take 2 tablets by mouth every morning. Patient taking differently: Take 40 mEq by mouth every morning. 12/17/20  Yes Hilty, Nadean Corwin, MD  QUEtiapine (SEROQUEL) 50 MG tablet TAKE 1 TABLET BY MOUTH EVERYDAY AT BEDTIME Patient taking differently: Take 50 mg by mouth at bedtime. 12/16/20  Yes [name], FNP  traMADol (ULTRAM) 50 MG tablet TAKE 1 TABLET 3 TIMES A DAY AND CAN TAKE 1 EXTRA 20 DAYS/MONTH Patient taking differently: Take 50 mg by mouth in the morning, at noon, and at bedtime. 01/31/21  Yes [name], FNP  nitroGLYCERIN (NITROSTAT) 0.4 MG SL tablet PLACE ONE TABLET UNDER THE TONGUE EVERY 5 MINUTES AS NEEDED FOR CHEST PAIN. Patient not taking: Reported on 04/26/2021 12/17/20   Pixie Casino, MD  rosuvastatin  (CRESTOR) 40 MG tablet Take 1 tablet (40 mg total) by mouth daily. Patient not taking: Reported on 04/26/2021 12/17/20   Pixie Casino, MD    Physical Exam: Vitals:   04/26/21 1345 04/26/21 1510 04/26/21 1605 04/26/21 1645  BP: (!) 146/122 (!) 180/133 (!) 140/94 (!) 158/59  Pulse: 72 79 83 81  Resp: 13 20 16 14   Temp:  TempSrc:      SpO2: 96% 100% 98% 97%    GENERAL: No acute distress.  Appears well.  HEENT: MMM.  Vision and hearing grossly intact.  NECK: Supple.  No apparent JVD.  RESP: 97% on RA.  No IWOB. Good air movement bilaterally. CVS:  RRR. Heart sounds normal.  ABD/GI/GU: Bowel sounds present. Soft. Non tender.  MSK/EXT:  Moves extremities. No apparent deformity or edema.  SKIN: Might have sacral decubitus with foul smell but not able to examine due to hip fracture and pain with movement.  NEURO: Awake.  Oriented to self, city and state only.  No gross deficit but limited exam due to mental status and hip fracture. PSYCH: Calm.  No distress or agitation.   Personally Reviewed Radiological Exams DG Chest 1 View  Result Date: 04/26/2021 CLINICAL DATA:  [place] out of chair, left hip fracture, non-small cell right lung cancer EXAM: CHEST  1 VIEW COMPARISON:  02/26/2021, 10/28/2020 FINDINGS: Single frontal view of the chest demonstrates a stable cardiac silhouette. Stable right apical mass consistent with biopsy-proven non-small cell lung cancer. Chronic areas of scarring are seen elsewhere within the mid upper lung zones. No new airspace disease, effusion, or pneumothorax. No acute bony abnormalities. IMPRESSION: 1. Stable right apical mass consistent with known biopsy-proven non-small cell lung cancer. 2. No acute intrathoracic trauma. Electronically Signed   By: Randa Ngo M.D.   On: 04/26/2021 15:24   CT Head Wo Contrast  Result Date: 04/26/2021 CLINICAL DATA:  Neck trauma (Age >= 65y); Head trauma, minor (Age >= 65y) EXAM: CT HEAD WITHOUT CONTRAST CT CERVICAL  SPINE WITHOUT CONTRAST TECHNIQUE: Multidetector CT imaging of the head and cervical spine was performed following the standard protocol without intravenous contrast. Multiplanar CT image reconstructions of the cervical spine were also generated. COMPARISON:  CT head February 26, 2021. CT cervical spine November 22, 2020. FINDINGS: CT HEAD FINDINGS Brain: No evidence of acute large vascular infarction, hemorrhage, hydrocephalus, extra-axial collection or mass lesion/mass effect. Similar patchy white matter hypoattenuation, nonspecific but compatible with chronic microvascular ischemic disease. Vascular: No hyperdense vessel identified. Calcific intracranial atherosclerosis. Skull: No acute fracture. Sinuses/Orbits: Opacified left anterior ethmoid air cell. Otherwise, largely clear sinuses. Unremarkable orbits. Other: No mastoid effusions. CT CERVICAL SPINE FINDINGS Motion limited study.  Within this limitation: Alignment: Similar alignment to the prior. Similar anterolisthesis of C6 on C7 with similar focal kyphosis at this level. Of the lower cervical spine dextrocurvature Skull base and vertebrae: Redemonstrated chronic erosions along the base of the odontoid with posterior pannus. There is also severe erosive change along the right atlantoaxial joint, similar. Cystic degenerative change of the dens, similar. C3-C5 ACDF with corpectomy at C4. No evidence of hardware fracture. Similar alignment. Fused facet joints at C3-C4 and C6-C7. New T2 (20%) and T3 (30%) vertebral body height loss with associated superior endplate sclerosis and lucency through the T3 superior endplate. Findings are partially imaged. Soft tissues and spinal canal: Motion limited evaluation without definite prevertebral edema or visible canal hematoma. Disc levels: Severe multilevel degenerative change. Craniocervical degenerative changes described above. Multilevel bilateral foraminal stenosis. Upper chest: Partially imaged nodular opacities in the  posterior right upper lobe, reportedly biopsy-proven non-small cell lung cancer IMPRESSION: CT cervical spine: 1. Acute T2 and T3 compression fractures, described above and partially imaged. Recommend dedicated CT of the thoracic spine to further characterize and to fully evaluate the thoracic spine. 2. Similar postoperative changes of C3-C4 ACDF and C4 corpectomy with similar alignment. 3. Severe  multilevel degenerative change, including severe craniocervical degenerative change and multilevel foraminal stenosis. MRI could further characterize the canal/cord/foramina if clinically indicated. 4. Partially imaged nodular opacities in the posterior right upper lobe, reportedly biopsy-proven non-small cell lung cancer CT head: No evidence of acute intracranial abnormality. Electronically Signed   By: Margaretha Sheffield M.D.   On: 04/26/2021 14:41   CT Cervical Spine Wo Contrast  Result Date: 04/26/2021 CLINICAL DATA:  Neck trauma (Age >= 65y); Head trauma, minor (Age >= 65y) EXAM: CT HEAD WITHOUT CONTRAST CT CERVICAL SPINE WITHOUT CONTRAST TECHNIQUE: Multidetector CT imaging of the head and cervical spine was performed following the standard protocol without intravenous contrast. Multiplanar CT image reconstructions of the cervical spine were also generated. COMPARISON:  CT head February 26, 2021. CT cervical spine November 22, 2020. FINDINGS: CT HEAD FINDINGS Brain: No evidence of acute large vascular infarction, hemorrhage, hydrocephalus, extra-axial collection or mass lesion/mass effect. Similar patchy white matter hypoattenuation, nonspecific but compatible with chronic microvascular ischemic disease. Vascular: No hyperdense vessel identified. Calcific intracranial atherosclerosis. Skull: No acute fracture. Sinuses/Orbits: Opacified left anterior ethmoid air cell. Otherwise, largely clear sinuses. Unremarkable orbits. Other: No mastoid effusions. CT CERVICAL SPINE FINDINGS Motion limited study.  Within this  limitation: Alignment: Similar alignment to the prior. Similar anterolisthesis of C6 on C7 with similar focal kyphosis at this level. Of the lower cervical spine dextrocurvature Skull base and vertebrae: Redemonstrated chronic erosions along the base of the odontoid with posterior pannus. There is also severe erosive change along the right atlantoaxial joint, similar. Cystic degenerative change of the dens, similar. C3-C5 ACDF with corpectomy at C4. No evidence of hardware fracture. Similar alignment. Fused facet joints at C3-C4 and C6-C7. New T2 (20%) and T3 (30%) vertebral body height loss with associated superior endplate sclerosis and lucency through the T3 superior endplate. Findings are partially imaged. Soft tissues and spinal canal: Motion limited evaluation without definite prevertebral edema or visible canal hematoma. Disc levels: Severe multilevel degenerative change. Craniocervical degenerative changes described above. Multilevel bilateral foraminal stenosis. Upper chest: Partially imaged nodular opacities in the posterior right upper lobe, reportedly biopsy-proven non-small cell lung cancer IMPRESSION: CT cervical spine: 1. Acute T2 and T3 compression fractures, described above and partially imaged. Recommend dedicated CT of the thoracic spine to further characterize and to fully evaluate the thoracic spine. 2. Similar postoperative changes of C3-C4 ACDF and C4 corpectomy with similar alignment. 3. Severe multilevel degenerative change, including severe craniocervical degenerative change and multilevel foraminal stenosis. MRI could further characterize the canal/cord/foramina if clinically indicated. 4. Partially imaged nodular opacities in the posterior right upper lobe, reportedly biopsy-proven non-small cell lung cancer CT head: No evidence of acute intracranial abnormality. Electronically Signed   By: Margaretha Sheffield M.D.   On: 04/26/2021 14:41   CT CHEST ABDOMEN PELVIS W CONTRAST  Result  Date: 04/26/2021 CLINICAL DATA:  Dementia patient found on the floor after a fall. EXAM: CT CHEST, ABDOMEN, AND PELVIS WITH CONTRAST TECHNIQUE: Multidetector CT imaging of the chest, abdomen and pelvis was performed following the standard protocol during bolus administration of intravenous contrast. CONTRAST:  83mL OMNIPAQUE IOHEXOL 300 MG/ML  SOLN COMPARISON:  10/28/2020 FINDINGS: CT CHEST FINDINGS Cardiovascular: Art size upper limits of normal. No pericardial fluid. Coronary artery calcification and aortic atherosclerotic calcification as seen previously. Mediastinum/Nodes: No mediastinal or hilar mass or lymphadenopathy. Lungs/Pleura: Indistinct mass lesion at the right apex, apparently malignant by biopsy, appears slightly larger. This is difficult to measure precisely because of the morphology. Elsewhere,  6 mm subsolid nodule previously seen in the superior segment of the left lower lobe is unchanged, axial image 37. No progressive feature. No pleural effusion seen on either side. No evidence of infectious pneumonia or pulmonary collapse. Musculoskeletal: No rib fracture on the right. On the left, there are numerous subacute healing rib fractures. There are subacute but somewhat more recent appearing rib fractures posteriorly affecting the left seventh, eighth and ninth ribs. Old appearing compression deformity at T9. Minor superior endplate fracture at T3, not present in June of this year. Sternal fracture which appears subacute but not united, not present in June of this year. CT ABDOMEN PELVIS FINDINGS Hepatobiliary: No liver parenchymal abnormality. Previous cholecystectomy. Chronic biliary ductal prominence. Pancreas: Negative Spleen: Normal Adrenals/Urinary Tract: Adrenal glands are normal. Multiple renal cysts on the left. Small nonobstructing renal calculi. Bladder is normal. Stomach/Bowel: Previous gastric surgery. Left inguinal hernia containing several loops of small intestine. No evidence of  obstruction or incarceration. No acute colon finding. Vascular/Lymphatic: Aortic atherosclerosis. No aneurysm. IVC is normal. No retroperitoneal adenopathy. Reproductive: No pelvic mass. Other: No free fluid or air. Musculoskeletal: Distant discectomy and fusion procedure L4 to the sacrum. Likely nonunion at L5-S1. subacute fracture of the pubic rami on the right, not yet healed. Acute intertrochanteric fracture of the left femur. IMPRESSION: Slight enlargement of an indistinct irregular mass lesion at the right apex, reported to be malignant by prior biopsy. No change in a 6 mm subsolid nodule in the superior segment of the left lower lobe. Multiple subacute rib fractures on the left of varying ages, not yet healed. No sign of acute fracture today. Old compression deformity at T9. More recent superior endplate fracture at T3, not present in June of this year, but probably subacute. Subacute sternal fracture, not yet united. No evidence of acute abdominal organ injury. Aortic atherosclerosis. Left inguinal hernia containing small bowel but no evidence of obstruction or incarceration. Subacute pubic rami fractures on the right. Acute intertrochanteric femoral fracture on the left. 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 Electronically Signed   By: Nelson Chimes M.D.   On: 04/26/2021 16:36   CT T-SPINE NO CHARGE  Result Date: 04/26/2021 CLINICAL DATA:  Fall with back pain EXAM: CT THORACIC SPINE WITHOUT CONTRAST TECHNIQUE: Multidetector CT images of the thoracic were obtained using the standard protocol without intravenous contrast. COMPARISON:  Cervical CT 11/22/2020.  CT chest 10/28/2020. FINDINGS: Alignment: Increased kyphotic curvature. Vertebrae: Old compression fracture at T9, not progressed since June. Newly seen superior endplate fractures at T2, T3 and T4, likely subacute. Loss of height 20% or less at those levels. No acute thoracic region fracture identified. Paraspinal and other soft tissues:  Negative Disc levels: No significant thoracic degenerative disease. No bony stenosis of the canal or foramina. IMPRESSION: No acute thoracic spine fracture suspected. Old fracture of T9. Likely subacute fractures at T2, T3 and T4, with only minimal loss of height. Electronically Signed   By: Nelson Chimes M.D.   On: 04/26/2021 16:38   CT L-SPINE NO CHARGE  Result Date: 04/26/2021 CLINICAL DATA:  [place].  Assess for lumbar compression fracture. EXAM: CT LUMBAR SPINE WITHOUT CONTRAST TECHNIQUE: Multidetector CT imaging of the lumbar spine was performed without intravenous contrast administration. Multiplanar CT image reconstructions were also generated. COMPARISON:  MRI 02/27/2021 FINDINGS: Segmentation: 5 lumbar type vertebral bodies. Alignment: Fixed anterolisthesis at L4-5 of 8 mm. Anterolisthesis at L5-S1 of 7 mm. Vertebrae: No lumbar region fracture. Likely solid union at the L4-5 level.  Nonunion at L5-S1, with pronounced lucency surrounding the sacral screws and nitrogen gas in the L5-S1 disc level. Paraspinal and other soft tissues: Negative Disc levels: L1-2 and L2-3 are negative. L3-4 shows mild bulging of the disc and facet hypertrophy. See above regarding the surgical region from L4 to the sacrum. IMPRESSION: No acute traumatic finding in the lumbar region. Previous discectomy and fusion procedure from L4 to the sacrum. 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 Electronically Signed   By: Nelson Chimes M.D.   On: 04/26/2021 16:41   DG Knee Left Port  Result Date: 04/26/2021 CLINICAL DATA:  Un witnessed fall, left knee pain EXAM: PORTABLE LEFT KNEE - 1-2 VIEW COMPARISON:  02/06/2006 FINDINGS: Frontal and lateral views of the left knee demonstrate 3 component left knee arthroplasty in the expected position without signs of acute complication. There are no acute displaced fractures. Small joint effusion. Diffuse soft tissue edema.  IMPRESSION: 1. No acute displaced fracture. 2. Diffuse soft tissue edema and trace left knee effusion. 3. Unremarkable 3 component left knee arthroplasty. Electronically Signed   By: Randa Ngo M.D.   On: 04/26/2021 17:05   DG Hip Unilat With Pelvis 2-3 Views Left  Result Date: 04/26/2021 CLINICAL DATA:  [place], left hip deformity EXAM: DG HIP (WITH OR WITHOUT PELVIS) 2-3V LEFT COMPARISON:  02/27/2021 FINDINGS: Frontal view of the pelvis as well as frontal and cross-table lateral views of the left hip are obtained. There is a displaced intertrochanteric left hip fracture, with mild impaction and varus angulation. No dislocation. There are fractures through the right superior and inferior pubic rami which are new since prior study. There is some sclerosis along the margins of these fractures and possible periosteal reaction, which could suggest subacute injury. Fracture lines are still readily apparent [facility] for. The bones are diffusely osteopenic. Sacroiliac joints are unremarkable. Stable postsurgical changes at the lumbosacral junction. IMPRESSION: 1. Acute intertrochanteric left hip fracture, with mild impaction and varus angulation. 2. Acute to subacute fractures through the right superior and inferior pubic rami. 3. Diffuse osteopenia. Electronically Signed   By: Randa Ngo M.D.   On: 04/26/2021 15:21     Personally Reviewed Labs: CBC: Recent Labs  Lab 04/26/21 1319  WBC 8.3  NEUTROABS 6.3  HGB 9.3*  HCT 32.1*  MCV 99.1  PLT 623   Basic Metabolic Panel: Recent Labs  Lab 04/26/21 1319  NA 142  K 4.3  CL 113*  CO2 23  GLUCOSE 120*  BUN 40*  CREATININE 1.23*  CALCIUM 8.9  MG 2.2   GFR: CrCl cannot be calculated (Unknown ideal weight.). Liver Function Tests: Recent Labs  Lab 04/26/21 1319  AST 22  ALT 19  ALKPHOS 168*  BILITOT 0.5  PROT 6.6  ALBUMIN 3.0*   No results for input(s): LIPASE, AMYLASE in the last 168 hours. No results for input(s): AMMONIA in the last  168 hours. Coagulation Profile: No results for input(s): INR, PROTIME in the last 168 hours. Cardiac Enzymes: Recent Labs  Lab 04/26/21 1319  CKTOTAL 110   BNP (last 3 results) No results for input(s): PROBNP in the last 8760 hours. HbA1C: No results for input(s): HGBA1C in the last 72 hours. CBG: Recent Labs  Lab 04/26/21 1311  GLUCAP 98   Lipid Profile: No results for input(s): CHOL, HDL, LDLCALC, TRIG, CHOLHDL, LDLDIRECT in the last 72 hours. Thyroid Function Tests: No results for input(s):  TSH, T4TOTAL, FREET4, T3FREE, THYROIDAB in the last 72 hours. Anemia Panel: No results for input(s): VITAMINB12, FOLATE, FERRITIN, TIBC, IRON, RETICCTPCT in the last 72 hours. Urine analysis:    Component Value Date/Time   COLORURINE YELLOW 02/27/2021 0504   APPEARANCEUR CLEAR 02/27/2021 0504   LABSPEC 1.016 02/27/2021 0504   PHURINE 7.0 02/27/2021 0504   GLUCOSEU NEGATIVE 02/27/2021 0504   HGBUR NEGATIVE 02/27/2021 0504   BILIRUBINUR NEGATIVE 02/27/2021 0504   BILIRUBINUR Negative 12/15/2020 1551   KETONESUR NEGATIVE 02/27/2021 0504   PROTEINUR NEGATIVE 02/27/2021 0504   UROBILINOGEN 0.2 12/15/2020 1551   UROBILINOGEN 0.2 09/12/2014 2302   NITRITE NEGATIVE 02/27/2021 0504   LEUKOCYTESUR NEGATIVE 02/27/2021 0504    Sepsis Labs:  None  Personally Reviewed EKG:  Twelve-lead EKG sinus rhythm with nonspecific T wave changes laterally  Assessment/Plan Unwitnessed fall at home likely due to ambulatory dysfunction/unsteady gait Left intratrochanteric femoral fracture on the left -Plan for ORIF by orthopedic surgery on 12/14. -As needed IV fentanyl and IV Robaxin for pain medication -N.p.o. after midnight -Gentle IV fluid -Bedrest  Chronic multiple rib fractures Chronic back pain/subacute T2, T3 and T4 compression fractures without significant height loss -Pain control as above  History of lung cancer status post SRBT-slight enlargement of the indistinct irregular mass  lesion at the right apex and stable 6 mm of solid nodule in LLL noted on CT. -Outpatient follow-up with orthopedic surgery  Chronic combined CHF: TTE in 11/2020 with LVEF of 55%, no R WMA, G1-DD.  Appears euvolemic on exam.  No cardiopulmonary symptoms. -Monitor fluid status -Hold home Lasix  Chronic COPD/OSA -Continue home inhalers -As needed DuoNeb  Dementia without behavioral disturbance: Only oriented to self, city and state.  -Reorientation and delirium precautions -Continue home meds  History of CAD/remote stents: EKG without acute ischemic finding. -Continue home meds  CKD-3A: Seems to be at baseline. Recent Labs    06/08/20 1336 10/28/20 1002 11/22/20 1354 11/23/20 0042 11/24/20 0221 02/02/21 1156 02/26/21 1900 03/01/21 0427 03/01/21 2107 04/26/21 1319  BUN 13 28* 24* 25* 26* 23 37* 14 14 40*  CREATININE 1.42* 1.72* 1.23* 1.27* 1.37* 1.39* 1.20* 0.98 1.07* 1.23*  -Continue monitoring  Normocytic anemia: H&H at baseline. Recent Labs    10/28/20 1002 11/22/20 1354 11/22/20 1901 11/23/20 0042 11/24/20 0221 02/26/21 1900 02/27/21 0514 03/01/21 0427 03/02/21 0143 04/26/21 1319  HGB 10.5* 12.5 11.4* 11.8* 9.6* 10.6* 10.5* 9.9* 9.5* 9.3*  -Check anemia panel -Continue monitoring  Essential hypertension: Slightly hypertensive likely due to pain. -IV metoprolol 2.5 mg every 6 hours -Resume home p.o. metoprolol after surgery  Sacral decubitus ulcer: Not able examine due to bedrest and severe pain with movement -Consult wound care    DVT prophylaxis: SCD to RLE  Code Status: DNR/DNI-confirmed with patient's son Family Communication: Updated patient's son over the phone.  Disposition Plan: Admit to telemetry Consults called: Orthopedic surgery Admission status: Inpatient Level of care: Telemetry Medical   Mercy Riding MD [facility]  If 7PM-7AM, please contact night-coverage [email or web address]  04/26/2021, 5:50 PM

## 2021-04-26 NOTE — H&P (View-Only) (Signed)
Reason for Consult:Left hip fx Referring Physician: Mali Sheldon Time called: [phone] Time at bedside: Sara Fox is an 80 y.o. female.  HPI: Sara Fox fell asleep in her chair after lunch and fell out of it onto the floor sometime this morning. Her son discovered her when he woke up and called 911. X-rays showed a left hip fx and orthopedic surgery was consulted. She will answer some questions but is demented and can only contribute minimally to history. She lives at home with her son and ambulates usually without a device though she's supposed to be using a RW.  Past Medical History:  Diagnosis Date   Acute kidney injury ([facility])    Back pain    Cervical cancer (HCC)    CHF (congestive heart failure) (HCC)    Closed fracture of left distal radius 05/28/2017   COPD (chronic obstructive pulmonary disease) (HCC)    Coronary artery disease    s/p DES x2 to RCA 2016   GERD (gastroesophageal reflux disease)    History of radiation therapy 07/27/20-08/16/20   Right lung- SBRT- Dr. Gery Pray    Hyperlipidemia    Hypertension    Lower leg edema 12/21/2016   Mild cognitive impairment    Myocardial infarction [facility])    Neck pain    Rhabdomyolysis    Stomach problems     Past Surgical History:  Procedure Laterality Date   ABDOMINAL HYSTERECTOMY     BACK SURGERY  2012   lower back   BRONCHIAL BIOPSY  05/25/2020   Procedure: BRONCHIAL BIOPSIES;  Surgeon: Collene Gobble, MD;  Location: [facility] ENDOSCOPY;  Service: Pulmonary;;   BRONCHIAL BRUSHINGS  05/25/2020   Procedure: BRONCHIAL BRUSHINGS;  Surgeon: Collene Gobble, MD;  Location: [facility] ENDOSCOPY;  Service: Pulmonary;;   BRONCHIAL NEEDLE ASPIRATION BIOPSY  05/25/2020   Procedure: BRONCHIAL NEEDLE ASPIRATION BIOPSIES;  Surgeon: Collene Gobble, MD;  Location: [facility] ENDOSCOPY;  Service: Pulmonary;;   CARDIAC CATHETERIZATION  06/1999   noncritical disease invovling PDA   CARDIAC CATHETERIZATION N/A 09/14/2014   Procedure: Left Heart Cath and Coronary  Angiography;  Surgeon: Leonie Man, MD;  Location: [facility] INVASIVE CV LAB CUPID;  Service: Cardiovascular;  Laterality: N/A;   CHOLECYSTECTOMY     ESOPHAGOGASTRODUODENOSCOPY (EGD) WITH PROPOFOL Left 07/19/2017   Procedure: ESOPHAGOGASTRODUODENOSCOPY (EGD) WITH PROPOFOL;  Surgeon: Ronnette Juniper, MD;  Location: [facility] ENDOSCOPY;  Service: Gastroenterology;  Laterality: Left;   FINE NEEDLE ASPIRATION  05/25/2020   Procedure: FINE NEEDLE ASPIRATION (FNA) LINEAR;  Surgeon: Collene Gobble, MD;  Location: [facility] ENDOSCOPY;  Service: Pulmonary;;   FRACTURE SURGERY Left 05/2017   left wrist   HARDWARE REMOVAL Left 11/07/2017   Procedure: LEFT WRIST HARDWARE REMOVAL;  Surgeon: Charlotte Crumb, MD;  Location: [facility];  Service: Orthopedics;  Laterality: Left;   KNEE SURGERY Bilateral 2001 & 2007   NECK SURGERY  2012   2012   PARTIAL GASTRECTOMY  2005   subtotal   PERCUTANEOUS CORONARY STENT INTERVENTION (PCI-S)  09/14/2014   Procedure: Percutaneous Coronary Stent Intervention (Pci-S);  Surgeon: Leonie Man, MD;  Location: [facility] INVASIVE CV LAB CUPID;  Service: Cardiovascular;;   SHOULDER SURGERY Right    TRANSTHORACIC ECHOCARDIOGRAM  06/02/2010   EF=>55%, normal LV systolic function; normal RV systolic function; mild mitral annular calcif; trace TR; AV mildly sclerotic   VIDEO BRONCHOSCOPY WITH ENDOBRONCHIAL NAVIGATION N/A 05/25/2020   Procedure: VIDEO BRONCHOSCOPY WITH ENDOBRONCHIAL NAVIGATION;  Surgeon: Collene Gobble, MD;  Location: [facility]  ENDOSCOPY;  Service: Pulmonary;  Laterality: N/A;   VIDEO BRONCHOSCOPY WITH ENDOBRONCHIAL ULTRASOUND N/A 05/25/2020   Procedure: VIDEO BRONCHOSCOPY WITH ENDOBRONCHIAL ULTRASOUND;  Surgeon: Collene Gobble, MD;  Location: [name] ENDOSCOPY;  Service: Pulmonary;  Laterality: N/A;   WRIST OSTEOTOMY Left 11/07/2017   Procedure: LEFT WRIST DISTAL ULNA RESECTION WITH EXTENSOR CARPI ULNARIS STABILIZATION;  Surgeon: Charlotte Crumb, MD;  Location: [facility];  Service: Orthopedics;  Laterality: Left;     Family History  Problem Relation Age of Onset   Heart disease Mother        also HTN   Diabetes Mother    Colon cancer Mother    Heart disease Brother        deceased at 34   Hypertension Brother    Heart attack Brother    Heart disease Brother    Hypertension Brother     Social History:  reports that she has been smoking cigarettes. She has a 30.00 pack-year smoking history. She has never used smokeless tobacco. She reports that she does not drink alcohol and does not use drugs.  Allergies:  Allergies  Allergen Reactions   Buprenorphine Hcl Shortness Of Breath    Throat swelling/trouble breathing and lethargic   Morphine And Related Shortness Of Breath    Throat swelling/trouble breathing and lethargic   Celebrex [Celecoxib] Diarrhea and Swelling    Swelling of legs    Vioxx [Rofecoxib] Palpitations   Codeine Other (See Comments)    Bloated     Medications: I have reviewed the patient's current medications.  Results for orders placed or performed during the hospital encounter of 04/26/21 (from the past 48 hour(s))  Lactic acid, plasma     Status: None   Collection Time: 04/26/21  1:00 PM  Result Value Ref Range   Lactic Acid, Venous 1.0 0.5 - 1.9 mmol/L    Comment: Performed at [facility], [address], [place], [place] [address]  POC CBG, ED     Status: None   Collection Time: 04/26/21  1:11 PM  Result Value Ref Range   Glucose-Capillary 98 70 - 99 mg/dL    Comment: Glucose reference range applies only to samples taken after fasting for at least 8 hours.  Comprehensive metabolic panel     Status: Abnormal   Collection Time: 04/26/21  1:19 PM  Result Value Ref Range   Sodium 142 135 - 145 mmol/L   Potassium 4.3 3.5 - 5.1 mmol/L   Chloride 113 (H) 98 - 111 mmol/L   CO2 23 22 - 32 mmol/L   Glucose, Bld 120 (H) 70 - 99 mg/dL    Comment: Glucose reference range applies only to samples taken after fasting for at least 8 hours.   BUN 40 (H) 8 - 23 mg/dL    Creatinine, Ser 1.23 (H) 0.44 - 1.00 mg/dL   Calcium 8.9 8.9 - 10.3 mg/dL   Total Protein 6.6 6.5 - 8.1 g/dL   Albumin 3.0 (L) 3.5 - 5.0 g/dL   AST 22 15 - 41 U/L   ALT 19 0 - 44 U/L   Alkaline Phosphatase 168 (H) 38 - 126 U/L   Total Bilirubin 0.5 0.3 - 1.2 mg/dL   GFR, Estimated 44 (L) >60 mL/min    Comment: (NOTE) Calculated using the CKD-EPI Creatinine Equation (2021)    Anion gap 6 5 - 15    Comment: Performed at [facility] [address] [place], [place] [address]  CBC with Differential  Status: Abnormal   Collection Time: 04/26/21  1:19 PM  Result Value Ref Range   WBC 8.3 4.0 - 10.5 K/uL   RBC 3.24 (L) 3.87 - 5.11 MIL/uL   Hemoglobin 9.3 (L) 12.0 - 15.0 g/dL   HCT 32.1 (L) 36.0 - 46.0 %   MCV 99.1 80.0 - 100.0 fL   MCH 28.7 26.0 - 34.0 pg   MCHC 29.0 (L) 30.0 - 36.0 g/dL   RDW 14.7 11.5 - 15.5 %   Platelets 264 150 - 400 K/uL   nRBC 0.0 0.0 - 0.2 %   Neutrophils Relative % 76 %   Neutro Abs 6.3 1.7 - 7.7 K/uL   Lymphocytes Relative 10 %   Lymphs Abs 0.8 0.7 - 4.0 K/uL   Monocytes Relative 14 %   Monocytes Absolute 1.1 (H) 0.1 - 1.0 K/uL   Eosinophils Relative 0 %   Eosinophils Absolute 0.0 0.0 - 0.5 K/uL   Basophils Relative 0 %   Basophils Absolute 0.0 0.0 - 0.1 K/uL   Immature Granulocytes 0 %   Abs Immature Granulocytes 0.03 0.00 - 0.07 K/uL    Comment: Performed at [facility], [address] [place], [place] [address]  Magnesium     Status: None   Collection Time: 04/26/21  1:19 PM  Result Value Ref Range   Magnesium 2.2 1.7 - 2.4 mg/dL    Comment: Performed at [facility] [address], [place], [place] [address]  CK     Status: None   Collection Time: 04/26/21  1:19 PM  Result Value Ref Range   Total CK 110 38 - 234 U/L    Comment: Performed at [facility], [place] [address] [place], [place] [address]  Resp Panel by RT-PCR (Flu A&B, Covid) Nasopharyngeal Swab     Status: None   Collection Time: 04/26/21  1:34 PM    Specimen: Nasopharyngeal Swab; Nasopharyngeal(NP) swabs in vial transport medium  Result Value Ref Range   SARS Coronavirus 2 by RT PCR NEGATIVE NEGATIVE    Comment: (NOTE) SARS-CoV-2 target nucleic acids are NOT DETECTED.  The SARS-CoV-2 RNA is generally detectable in upper respiratory specimens during the acute phase of infection. The lowest concentration of SARS-CoV-2 viral copies this assay can detect is 138 copies/mL. A negative result does not preclude SARS-Cov-2 infection and should not be used as the sole basis for treatment or other patient management decisions. A negative result may occur with  improper specimen collection/handling, submission of specimen other than nasopharyngeal swab, presence of viral mutation(s) within the areas targeted by this assay, and inadequate number of viral copies(<138 copies/mL). A negative result must be combined with clinical observations, patient history, and epidemiological information. The expected result is Negative.  Fact Sheet for Patients:  EntrepreneurPulse.com.au  Fact Sheet for Healthcare Providers:  IncredibleEmployment.be  This test is no t yet approved or cleared by the [place] FDA and  has been authorized for detection and/or diagnosis of SARS-CoV-2 by FDA under an Emergency Use Authorization (EUA). This EUA will remain  in effect (meaning this test can be used) for the duration of the COVID-19 declaration under Section 564(b)(1) of the Act, 21 U.S.C.section 360bbb-3(b)(1), unless the authorization is terminated  or revoked sooner.       Influenza A by PCR NEGATIVE NEGATIVE   Influenza B by PCR NEGATIVE NEGATIVE    Comment: (NOTE) The Xpert Xpress SARS-CoV-2/FLU/RSV plus assay is intended as an aid in the diagnosis  of influenza from Nasopharyngeal swab specimens and should not be used as a sole basis for treatment. Nasal washings and aspirates are unacceptable for Xpert Xpress  SARS-CoV-2/FLU/RSV testing.  Fact Sheet for Patients: EntrepreneurPulse.com.au  Fact Sheet for Healthcare Providers: IncredibleEmployment.be  This test is not yet approved or cleared by the [place] FDA and has been authorized for detection and/or diagnosis of SARS-CoV-2 by FDA under an Emergency Use Authorization (EUA). This EUA will remain in effect (meaning this test can be used) for the duration of the COVID-19 declaration under Section 564(b)(1) of the Act, 21 U.S.C. section 360bbb-3(b)(1), unless the authorization is terminated or revoked.  Performed at [facility], [place] [address], [place], [place] [address]     DG Chest 1 View  Result Date: 04/26/2021 CLINICAL DATA:  [address] [address] out of chair, left hip fracture, non-small cell right lung cancer EXAM: CHEST  1 VIEW COMPARISON:  02/26/2021, 10/28/2020 FINDINGS: Single frontal view of the chest demonstrates a stable cardiac silhouette. Stable right apical mass consistent with biopsy-proven non-small cell lung cancer. Chronic areas of scarring are seen elsewhere within the mid upper lung zones. No new airspace disease, effusion, or pneumothorax. No acute bony abnormalities. IMPRESSION: 1. Stable right apical mass consistent with known biopsy-proven non-small cell lung cancer. 2. No acute intrathoracic trauma. Electronically Signed   By: Randa Ngo M.D.   On: 04/26/2021 15:24   CT Head Wo Contrast  Result Date: 04/26/2021 CLINICAL DATA:  Neck trauma (Age >= 65y); Head trauma, minor (Age >= 65y) EXAM: CT HEAD WITHOUT CONTRAST CT CERVICAL SPINE WITHOUT CONTRAST TECHNIQUE: Multidetector CT imaging of the head and cervical spine was performed following the standard protocol without intravenous contrast. Multiplanar CT image reconstructions of the cervical spine were also generated. COMPARISON:  CT head February 26, 2021. CT cervical spine November 22, 2020. FINDINGS: CT HEAD FINDINGS Brain: No  evidence of acute large vascular infarction, hemorrhage, hydrocephalus, extra-axial collection or mass lesion/mass effect. Similar patchy white matter hypoattenuation, nonspecific but compatible with chronic microvascular ischemic disease. Vascular: No hyperdense vessel identified. Calcific intracranial atherosclerosis. Skull: No acute fracture. Sinuses/Orbits: Opacified left anterior ethmoid air cell. Otherwise, largely clear sinuses. Unremarkable orbits. Other: No mastoid effusions. CT CERVICAL SPINE FINDINGS Motion limited study.  Within this limitation: Alignment: Similar alignment to the prior. Similar anterolisthesis of C6 on C7 with similar focal kyphosis at this level. Of the lower cervical spine dextrocurvature Skull base and vertebrae: Redemonstrated chronic erosions along the base of the odontoid with posterior pannus. There is also severe erosive change along the right atlantoaxial joint, similar. Cystic degenerative change of the dens, similar. C3-C5 ACDF with corpectomy at C4. No evidence of hardware fracture. Similar alignment. Fused facet joints at C3-C4 and C6-C7. New T2 (20%) and T3 (30%) vertebral body height loss with associated superior endplate sclerosis and lucency through the T3 superior endplate. Findings are partially imaged. Soft tissues and spinal canal: Motion limited evaluation without definite prevertebral edema or visible canal hematoma. Disc levels: Severe multilevel degenerative change. Craniocervical degenerative changes described above. Multilevel bilateral foraminal stenosis. Upper chest: Partially imaged nodular opacities in the posterior right upper lobe, reportedly biopsy-proven non-small cell lung cancer IMPRESSION: CT cervical spine: 1. Acute T2 and T3 compression fractures, described above and partially imaged. Recommend dedicated CT of the thoracic spine to further characterize and to fully evaluate the thoracic spine. 2. Similar postoperative changes of C3-C4 ACDF and C4  corpectomy with similar alignment. 3. Severe multilevel degenerative change, including severe  craniocervical degenerative change and multilevel foraminal stenosis. MRI could further characterize the canal/cord/foramina if clinically indicated. 4. Partially imaged nodular opacities in the posterior right upper lobe, reportedly biopsy-proven non-small cell lung cancer CT head: No evidence of acute intracranial abnormality. Electronically Signed   By: Margaretha Sheffield M.D.   On: 04/26/2021 14:41   CT Cervical Spine Wo Contrast  Result Date: 04/26/2021 CLINICAL DATA:  Neck trauma (Age >= 65y); Head trauma, minor (Age >= 65y) EXAM: CT HEAD WITHOUT CONTRAST CT CERVICAL SPINE WITHOUT CONTRAST TECHNIQUE: Multidetector CT imaging of the head and cervical spine was performed following the standard protocol without intravenous contrast. Multiplanar CT image reconstructions of the cervical spine were also generated. COMPARISON:  CT head February 26, 2021. CT cervical spine November 22, 2020. FINDINGS: CT HEAD FINDINGS Brain: No evidence of acute large vascular infarction, hemorrhage, hydrocephalus, extra-axial collection or mass lesion/mass effect. Similar patchy white matter hypoattenuation, nonspecific but compatible with chronic microvascular ischemic disease. Vascular: No hyperdense vessel identified. Calcific intracranial atherosclerosis. Skull: No acute fracture. Sinuses/Orbits: Opacified left anterior ethmoid air cell. Otherwise, largely clear sinuses. Unremarkable orbits. Other: No mastoid effusions. CT CERVICAL SPINE FINDINGS Motion limited study.  Within this limitation: Alignment: Similar alignment to the prior. Similar anterolisthesis of C6 on C7 with similar focal kyphosis at this level. Of the lower cervical spine dextrocurvature Skull base and vertebrae: Redemonstrated chronic erosions along the base of the odontoid with posterior pannus. There is also severe erosive change along the right atlantoaxial joint,  similar. Cystic degenerative change of the dens, similar. C3-C5 ACDF with corpectomy at C4. No evidence of hardware fracture. Similar alignment. Fused facet joints at C3-C4 and C6-C7. New T2 (20%) and T3 (30%) vertebral body height loss with associated superior endplate sclerosis and lucency through the T3 superior endplate. Findings are partially imaged. Soft tissues and spinal canal: Motion limited evaluation without definite prevertebral edema or visible canal hematoma. Disc levels: Severe multilevel degenerative change. Craniocervical degenerative changes described above. Multilevel bilateral foraminal stenosis. Upper chest: Partially imaged nodular opacities in the posterior right upper lobe, reportedly biopsy-proven non-small cell lung cancer IMPRESSION: CT cervical spine: 1. Acute T2 and T3 compression fractures, described above and partially imaged. Recommend dedicated CT of the thoracic spine to further characterize and to fully evaluate the thoracic spine. 2. Similar postoperative changes of C3-C4 ACDF and C4 corpectomy with similar alignment. 3. Severe multilevel degenerative change, including severe craniocervical degenerative change and multilevel foraminal stenosis. MRI could further characterize the canal/cord/foramina if clinically indicated. 4. Partially imaged nodular opacities in the posterior right upper lobe, reportedly biopsy-proven non-small cell lung cancer CT head: No evidence of acute intracranial abnormality. Electronically Signed   By: Margaretha Sheffield M.D.   On: 04/26/2021 14:41   DG Hip Unilat With Pelvis 2-3 Views Left  Result Date: 04/26/2021 CLINICAL DATA:  [address], left hip deformity EXAM: DG HIP (WITH OR WITHOUT PELVIS) 2-3V LEFT COMPARISON:  02/27/2021 FINDINGS: Frontal view of the pelvis as well as frontal and cross-table lateral views of the left hip are obtained. There is a displaced intertrochanteric left hip fracture, with mild impaction and varus angulation. No  dislocation. There are fractures through the right superior and inferior pubic rami which are new since prior study. There is some sclerosis along the margins of these fractures and possible periosteal reaction, which could suggest subacute injury. Fracture lines are still readily apparent [facility] for. The bones are diffusely osteopenic. Sacroiliac joints are unremarkable. Stable postsurgical changes at the lumbosacral junction.  IMPRESSION: 1. Acute intertrochanteric left hip fracture, with mild impaction and varus angulation. 2. Acute to subacute fractures through the right superior and inferior pubic rami. 3. Diffuse osteopenia. Electronically Signed   By: Randa Ngo M.D.   On: 04/26/2021 15:21    Review of Systems  HENT:  Negative for ear discharge, ear pain, hearing loss and tinnitus.   Eyes:  Negative for photophobia and pain.  Respiratory:  Negative for cough and shortness of breath.   Cardiovascular:  Negative for chest pain.  Gastrointestinal:  Negative for abdominal pain, nausea and vomiting.  Genitourinary:  Negative for dysuria, flank pain, frequency and urgency.  Musculoskeletal:  Positive for arthralgias (Left hip). Negative for back pain, myalgias and neck pain.  Neurological:  Negative for dizziness and headaches.  Hematological:  Does not bruise/bleed easily.  Psychiatric/Behavioral:  The patient is not nervous/anxious.   Blood pressure (!) 180/133, pulse 79, temperature 97.7 F (36.5 C), temperature source Oral, resp. rate 20, SpO2 100 %. Physical Exam Constitutional:      General: She is not in acute distress.    Appearance: She is well-developed. She is not diaphoretic.  HENT:     Head: Normocephalic and atraumatic.  Eyes:     General: No scleral icterus.       Right eye: No discharge.        Left eye: No discharge.     Conjunctiva/sclera: Conjunctivae normal.  Cardiovascular:     Rate and Rhythm: Normal rate and regular rhythm.  Pulmonary:     Effort: Pulmonary  effort is normal. No respiratory distress.  Musculoskeletal:     Cervical back: Normal range of motion.     Comments: LLE No traumatic wounds, ecchymosis, or rash  Severe TTP hip, also medial knee  No knee or ankle effusion  Sens DPN, SPN, TN intact  Motor EHL, ext, flex, evers 5/5  DP 1+, PT 1+, No significant edema  Skin:    General: Skin is warm and dry.  Neurological:     Mental Status: She is alert.  Psychiatric:        Mood and Affect: Mood normal.        Behavior: Behavior normal.    Assessment/Plan: Left hip fx -- Plan IMN tomorrow by Dr. Doreatha Martin. Please keep NPO after MN. Left knee pain -- Will get x-rays though likely just referred pain. Questionable acute right rami fxs -- Will not change plan but may affect mobility. WBAT.    Lisette Abu, PA-C Orthopedic Surgery [phone] 04/26/2021, 3:30 PM

## 2021-04-26 NOTE — ED Notes (Signed)
[facility] sent to lab except [place].  All Type and screen paperwork sent.

## 2021-04-26 NOTE — Consult Note (Addendum)
Reason for Consult:Left hip fx Referring Physician: Mali Sheldon Time called: [phone] Time at bedside: Sara Fox is an 80 y.o. female.  HPI: Bette fell asleep in her chair after lunch and fell out of it onto the floor sometime this morning. Her son discovered her when he woke up and called 911. X-rays showed a left hip fx and orthopedic surgery was consulted. She will answer some questions but is demented and can only contribute minimally to history. She lives at home with her son and ambulates usually without a device though she's supposed to be using a RW.  Past Medical History:  Diagnosis Date   Acute kidney injury ([place])    Back pain    Cervical cancer (HCC)    CHF (congestive heart failure) (HCC)    Closed fracture of left distal radius 05/28/2017   COPD (chronic obstructive pulmonary disease) (HCC)    Coronary artery disease    s/p DES x2 to RCA 2016   GERD (gastroesophageal reflux disease)    History of radiation therapy 07/27/20-08/16/20   Right lung- SBRT- Dr. Gery Pray    Hyperlipidemia    Hypertension    Lower leg edema 12/21/2016   Mild cognitive impairment    Myocardial infarction [facility])    Neck pain    Rhabdomyolysis    Stomach problems     Past Surgical History:  Procedure Laterality Date   ABDOMINAL HYSTERECTOMY     BACK SURGERY  2012   lower back   BRONCHIAL BIOPSY  05/25/2020   Procedure: BRONCHIAL BIOPSIES;  Surgeon: Collene Gobble, MD;  Location: [facility] ENDOSCOPY;  Service: Pulmonary;;   BRONCHIAL BRUSHINGS  05/25/2020   Procedure: BRONCHIAL BRUSHINGS;  Surgeon: Collene Gobble, MD;  Location: [facility] ENDOSCOPY;  Service: Pulmonary;;   BRONCHIAL NEEDLE ASPIRATION BIOPSY  05/25/2020   Procedure: BRONCHIAL NEEDLE ASPIRATION BIOPSIES;  Surgeon: Collene Gobble, MD;  Location: [place] ENDOSCOPY;  Service: Pulmonary;;   CARDIAC CATHETERIZATION  06/1999   noncritical disease invovling PDA   CARDIAC CATHETERIZATION N/A 09/14/2014   Procedure: Left Heart Cath and Coronary  Angiography;  Surgeon: Leonie Man, MD;  Location: [facility] INVASIVE CV LAB CUPID;  Service: Cardiovascular;  Laterality: N/A;   CHOLECYSTECTOMY     ESOPHAGOGASTRODUODENOSCOPY (EGD) WITH PROPOFOL Left 07/19/2017   Procedure: ESOPHAGOGASTRODUODENOSCOPY (EGD) WITH PROPOFOL;  Surgeon: Ronnette Juniper, MD;  Location: [facility] ENDOSCOPY;  Service: Gastroenterology;  Laterality: Left;   FINE NEEDLE ASPIRATION  05/25/2020   Procedure: FINE NEEDLE ASPIRATION (FNA) LINEAR;  Surgeon: Collene Gobble, MD;  Location: [facility] ENDOSCOPY;  Service: Pulmonary;;   FRACTURE SURGERY Left 05/2017   left wrist   HARDWARE REMOVAL Left 11/07/2017   Procedure: LEFT WRIST HARDWARE REMOVAL;  Surgeon: Charlotte Crumb, MD;  Location: [place];  Service: Orthopedics;  Laterality: Left;   KNEE SURGERY Bilateral 2001 & 2007   NECK SURGERY  2012   2012   PARTIAL GASTRECTOMY  2005   subtotal   PERCUTANEOUS CORONARY STENT INTERVENTION (PCI-S)  09/14/2014   Procedure: Percutaneous Coronary Stent Intervention (Pci-S);  Surgeon: Leonie Man, MD;  Location: [facility] INVASIVE CV LAB CUPID;  Service: Cardiovascular;;   SHOULDER SURGERY Right    TRANSTHORACIC ECHOCARDIOGRAM  06/02/2010   EF=>55%, normal LV systolic function; normal RV systolic function; mild mitral annular calcif; trace TR; AV mildly sclerotic   VIDEO BRONCHOSCOPY WITH ENDOBRONCHIAL NAVIGATION N/A 05/25/2020   Procedure: VIDEO BRONCHOSCOPY WITH ENDOBRONCHIAL NAVIGATION;  Surgeon: Collene Gobble, MD;  Location: [facility]  ENDOSCOPY;  Service: Pulmonary;  Laterality: N/A;   VIDEO BRONCHOSCOPY WITH ENDOBRONCHIAL ULTRASOUND N/A 05/25/2020   Procedure: VIDEO BRONCHOSCOPY WITH ENDOBRONCHIAL ULTRASOUND;  Surgeon: Collene Gobble, MD;  Location: [place] ENDOSCOPY;  Service: Pulmonary;  Laterality: N/A;   WRIST OSTEOTOMY Left 11/07/2017   Procedure: LEFT WRIST DISTAL ULNA RESECTION WITH EXTENSOR CARPI ULNARIS STABILIZATION;  Surgeon: Charlotte Crumb, MD;  Location: [place];  Service: Orthopedics;  Laterality: Left;     Family History  Problem Relation Age of Onset   Heart disease Mother        also HTN   Diabetes Mother    Colon cancer Mother    Heart disease Brother        deceased at 55   Hypertension Brother    Heart attack Brother    Heart disease Brother    Hypertension Brother     Social History:  reports that she has been smoking cigarettes. She has a 30.00 pack-year smoking history. She has never used smokeless tobacco. She reports that she does not drink alcohol and does not use drugs.  Allergies:  Allergies  Allergen Reactions   Buprenorphine Hcl Shortness Of Breath    Throat swelling/trouble breathing and lethargic   Morphine And Related Shortness Of Breath    Throat swelling/trouble breathing and lethargic   Celebrex [Celecoxib] Diarrhea and Swelling    Swelling of legs    Vioxx [Rofecoxib] Palpitations   Codeine Other (See Comments)    Bloated     Medications: I have reviewed the patient's current medications.  Results for orders placed or performed during the hospital encounter of 04/26/21 (from the past 48 hour(s))  Lactic acid, plasma     Status: None   Collection Time: 04/26/21  1:00 PM  Result Value Ref Range   Lactic Acid, Venous 1.0 0.5 - 1.9 mmol/L    Comment: Performed at [facility], [address] [place], [place] [address]  POC CBG, ED     Status: None   Collection Time: 04/26/21  1:11 PM  Result Value Ref Range   Glucose-Capillary 98 70 - 99 mg/dL    Comment: Glucose reference range applies only to samples taken after fasting for at least 8 hours.  Comprehensive metabolic panel     Status: Abnormal   Collection Time: 04/26/21  1:19 PM  Result Value Ref Range   Sodium 142 135 - 145 mmol/L   Potassium 4.3 3.5 - 5.1 mmol/L   Chloride 113 (H) 98 - 111 mmol/L   CO2 23 22 - 32 mmol/L   Glucose, Bld 120 (H) 70 - 99 mg/dL    Comment: Glucose reference range applies only to samples taken after fasting for at least 8 hours.   BUN 40 (H) 8 - 23 mg/dL    Creatinine, Ser 1.23 (H) 0.44 - 1.00 mg/dL   Calcium 8.9 8.9 - 10.3 mg/dL   Total Protein 6.6 6.5 - 8.1 g/dL   Albumin 3.0 (L) 3.5 - 5.0 g/dL   AST 22 15 - 41 U/L   ALT 19 0 - 44 U/L   Alkaline Phosphatase 168 (H) 38 - 126 U/L   Total Bilirubin 0.5 0.3 - 1.2 mg/dL   GFR, Estimated 44 (L) >60 mL/min    Comment: (NOTE) Calculated using the CKD-EPI Creatinine Equation (2021)    Anion gap 6 5 - 15    Comment: Performed at [facility] [address], [place], [place] [address]  CBC with Differential  Status: Abnormal   Collection Time: 04/26/21  1:19 PM  Result Value Ref Range   WBC 8.3 4.0 - 10.5 K/uL   RBC 3.24 (L) 3.87 - 5.11 MIL/uL   Hemoglobin 9.3 (L) 12.0 - 15.0 g/dL   HCT 32.1 (L) 36.0 - 46.0 %   MCV 99.1 80.0 - 100.0 fL   MCH 28.7 26.0 - 34.0 pg   MCHC 29.0 (L) 30.0 - 36.0 g/dL   RDW 14.7 11.5 - 15.5 %   Platelets 264 150 - 400 K/uL   nRBC 0.0 0.0 - 0.2 %   Neutrophils Relative % 76 %   Neutro Abs 6.3 1.7 - 7.7 K/uL   Lymphocytes Relative 10 %   Lymphs Abs 0.8 0.7 - 4.0 K/uL   Monocytes Relative 14 %   Monocytes Absolute 1.1 (H) 0.1 - 1.0 K/uL   Eosinophils Relative 0 %   Eosinophils Absolute 0.0 0.0 - 0.5 K/uL   Basophils Relative 0 %   Basophils Absolute 0.0 0.0 - 0.1 K/uL   Immature Granulocytes 0 %   Abs Immature Granulocytes 0.03 0.00 - 0.07 K/uL    Comment: Performed at [facility], [address], [place], [place] [address]  Magnesium     Status: None   Collection Time: 04/26/21  1:19 PM  Result Value Ref Range   Magnesium 2.2 1.7 - 2.4 mg/dL    Comment: Performed at [facility] [address], [place], [place] [address]  CK     Status: None   Collection Time: 04/26/21  1:19 PM  Result Value Ref Range   Total CK 110 38 - 234 U/L    Comment: Performed at [facility], [place] [address] [place], [place] [address]  Resp Panel by RT-PCR (Flu A&B, Covid) Nasopharyngeal Swab     Status: None   Collection Time: 04/26/21  1:34 PM    Specimen: Nasopharyngeal Swab; Nasopharyngeal(NP) swabs in vial transport medium  Result Value Ref Range   SARS Coronavirus 2 by RT PCR NEGATIVE NEGATIVE    Comment: (NOTE) SARS-CoV-2 target nucleic acids are NOT DETECTED.  The SARS-CoV-2 RNA is generally detectable in upper respiratory specimens during the acute phase of infection. The lowest concentration of SARS-CoV-2 viral copies this assay can detect is 138 copies/mL. A negative result does not preclude SARS-Cov-2 infection and should not be used as the sole basis for treatment or other patient management decisions. A negative result may occur with  improper specimen collection/handling, submission of specimen other than nasopharyngeal swab, presence of viral mutation(s) within the areas targeted by this assay, and inadequate number of viral copies(<138 copies/mL). A negative result must be combined with clinical observations, patient history, and epidemiological information. The expected result is Negative.  Fact Sheet for Patients:  EntrepreneurPulse.com.au  Fact Sheet for Healthcare Providers:  IncredibleEmployment.be  This test is no t yet approved or cleared by the [place] FDA and  has been authorized for detection and/or diagnosis of SARS-CoV-2 by FDA under an Emergency Use Authorization (EUA). This EUA will remain  in effect (meaning this test can be used) for the duration of the COVID-19 declaration under Section 564(b)(1) of the Act, 21 U.S.C.section 360bbb-3(b)(1), unless the authorization is terminated  or revoked sooner.       Influenza A by PCR NEGATIVE NEGATIVE   Influenza B by PCR NEGATIVE NEGATIVE    Comment: (NOTE) The Xpert Xpress SARS-CoV-2/FLU/RSV plus assay is intended as an aid in the diagnosis  of influenza from Nasopharyngeal swab specimens and should not be used as a sole basis for treatment. Nasal washings and aspirates are unacceptable for Xpert Xpress  SARS-CoV-2/FLU/RSV testing.  Fact Sheet for Patients: EntrepreneurPulse.com.au  Fact Sheet for Healthcare Providers: IncredibleEmployment.be  This test is not yet approved or cleared by the [place] FDA and has been authorized for detection and/or diagnosis of SARS-CoV-2 by FDA under an Emergency Use Authorization (EUA). This EUA will remain in effect (meaning this test can be used) for the duration of the COVID-19 declaration under Section 564(b)(1) of the Act, 21 U.S.C. section 360bbb-3(b)(1), unless the authorization is terminated or revoked.  Performed at [facility], [place] [address], [place], [place] [address]     DG Chest 1 View  Result Date: 04/26/2021 CLINICAL DATA:  [address] out of chair, left hip fracture, non-small cell right lung cancer EXAM: CHEST  1 VIEW COMPARISON:  02/26/2021, 10/28/2020 FINDINGS: Single frontal view of the chest demonstrates a stable cardiac silhouette. Stable right apical mass consistent with biopsy-proven non-small cell lung cancer. Chronic areas of scarring are seen elsewhere within the mid upper lung zones. No new airspace disease, effusion, or pneumothorax. No acute bony abnormalities. IMPRESSION: 1. Stable right apical mass consistent with known biopsy-proven non-small cell lung cancer. 2. No acute intrathoracic trauma. Electronically Signed   By: Randa Ngo M.D.   On: 04/26/2021 15:24   CT Head Wo Contrast  Result Date: 04/26/2021 CLINICAL DATA:  Neck trauma (Age >= 65y); Head trauma, minor (Age >= 65y) EXAM: CT HEAD WITHOUT CONTRAST CT CERVICAL SPINE WITHOUT CONTRAST TECHNIQUE: Multidetector CT imaging of the head and cervical spine was performed following the standard protocol without intravenous contrast. Multiplanar CT image reconstructions of the cervical spine were also generated. COMPARISON:  CT head February 26, 2021. CT cervical spine November 22, 2020. FINDINGS: CT HEAD FINDINGS Brain: No  evidence of acute large vascular infarction, hemorrhage, hydrocephalus, extra-axial collection or mass lesion/mass effect. Similar patchy white matter hypoattenuation, nonspecific but compatible with chronic microvascular ischemic disease. Vascular: No hyperdense vessel identified. Calcific intracranial atherosclerosis. Skull: No acute fracture. Sinuses/Orbits: Opacified left anterior ethmoid air cell. Otherwise, largely clear sinuses. Unremarkable orbits. Other: No mastoid effusions. CT CERVICAL SPINE FINDINGS Motion limited study.  Within this limitation: Alignment: Similar alignment to the prior. Similar anterolisthesis of C6 on C7 with similar focal kyphosis at this level. Of the lower cervical spine dextrocurvature Skull base and vertebrae: Redemonstrated chronic erosions along the base of the odontoid with posterior pannus. There is also severe erosive change along the right atlantoaxial joint, similar. Cystic degenerative change of the dens, similar. C3-C5 ACDF with corpectomy at C4. No evidence of hardware fracture. Similar alignment. Fused facet joints at C3-C4 and C6-C7. New T2 (20%) and T3 (30%) vertebral body height loss with associated superior endplate sclerosis and lucency through the T3 superior endplate. Findings are partially imaged. Soft tissues and spinal canal: Motion limited evaluation without definite prevertebral edema or visible canal hematoma. Disc levels: Severe multilevel degenerative change. Craniocervical degenerative changes described above. Multilevel bilateral foraminal stenosis. Upper chest: Partially imaged nodular opacities in the posterior right upper lobe, reportedly biopsy-proven non-small cell lung cancer IMPRESSION: CT cervical spine: 1. Acute T2 and T3 compression fractures, described above and partially imaged. Recommend dedicated CT of the thoracic spine to further characterize and to fully evaluate the thoracic spine. 2. Similar postoperative changes of C3-C4 ACDF and C4  corpectomy with similar alignment. 3. Severe multilevel degenerative change, including severe  craniocervical degenerative change and multilevel foraminal stenosis. MRI could further characterize the canal/cord/foramina if clinically indicated. 4. Partially imaged nodular opacities in the posterior right upper lobe, reportedly biopsy-proven non-small cell lung cancer CT head: No evidence of acute intracranial abnormality. Electronically Signed   By: Margaretha Sheffield M.D.   On: 04/26/2021 14:41   CT Cervical Spine Wo Contrast  Result Date: 04/26/2021 CLINICAL DATA:  Neck trauma (Age >= 65y); Head trauma, minor (Age >= 65y) EXAM: CT HEAD WITHOUT CONTRAST CT CERVICAL SPINE WITHOUT CONTRAST TECHNIQUE: Multidetector CT imaging of the head and cervical spine was performed following the standard protocol without intravenous contrast. Multiplanar CT image reconstructions of the cervical spine were also generated. COMPARISON:  CT head February 26, 2021. CT cervical spine November 22, 2020. FINDINGS: CT HEAD FINDINGS Brain: No evidence of acute large vascular infarction, hemorrhage, hydrocephalus, extra-axial collection or mass lesion/mass effect. Similar patchy white matter hypoattenuation, nonspecific but compatible with chronic microvascular ischemic disease. Vascular: No hyperdense vessel identified. Calcific intracranial atherosclerosis. Skull: No acute fracture. Sinuses/Orbits: Opacified left anterior ethmoid air cell. Otherwise, largely clear sinuses. Unremarkable orbits. Other: No mastoid effusions. CT CERVICAL SPINE FINDINGS Motion limited study.  Within this limitation: Alignment: Similar alignment to the prior. Similar anterolisthesis of C6 on C7 with similar focal kyphosis at this level. Of the lower cervical spine dextrocurvature Skull base and vertebrae: Redemonstrated chronic erosions along the base of the odontoid with posterior pannus. There is also severe erosive change along the right atlantoaxial joint,  similar. Cystic degenerative change of the dens, similar. C3-C5 ACDF with corpectomy at C4. No evidence of hardware fracture. Similar alignment. Fused facet joints at C3-C4 and C6-C7. New T2 (20%) and T3 (30%) vertebral body height loss with associated superior endplate sclerosis and lucency through the T3 superior endplate. Findings are partially imaged. Soft tissues and spinal canal: Motion limited evaluation without definite prevertebral edema or visible canal hematoma. Disc levels: Severe multilevel degenerative change. Craniocervical degenerative changes described above. Multilevel bilateral foraminal stenosis. Upper chest: Partially imaged nodular opacities in the posterior right upper lobe, reportedly biopsy-proven non-small cell lung cancer IMPRESSION: CT cervical spine: 1. Acute T2 and T3 compression fractures, described above and partially imaged. Recommend dedicated CT of the thoracic spine to further characterize and to fully evaluate the thoracic spine. 2. Similar postoperative changes of C3-C4 ACDF and C4 corpectomy with similar alignment. 3. Severe multilevel degenerative change, including severe craniocervical degenerative change and multilevel foraminal stenosis. MRI could further characterize the canal/cord/foramina if clinically indicated. 4. Partially imaged nodular opacities in the posterior right upper lobe, reportedly biopsy-proven non-small cell lung cancer CT head: No evidence of acute intracranial abnormality. Electronically Signed   By: Margaretha Sheffield M.D.   On: 04/26/2021 14:41   DG Hip Unilat With Pelvis 2-3 Views Left  Result Date: 04/26/2021 CLINICAL DATA:  [address], left hip deformity EXAM: DG HIP (WITH OR WITHOUT PELVIS) 2-3V LEFT COMPARISON:  02/27/2021 FINDINGS: Frontal view of the pelvis as well as frontal and cross-table lateral views of the left hip are obtained. There is a displaced intertrochanteric left hip fracture, with mild impaction and varus angulation. No  dislocation. There are fractures through the right superior and inferior pubic rami which are new since prior study. There is some sclerosis along the margins of these fractures and possible periosteal reaction, which could suggest subacute injury. Fracture lines are still readily apparent [facility] for. The bones are diffusely osteopenic. Sacroiliac joints are unremarkable. Stable postsurgical changes at the lumbosacral junction.  IMPRESSION: 1. Acute intertrochanteric left hip fracture, with mild impaction and varus angulation. 2. Acute to subacute fractures through the right superior and inferior pubic rami. 3. Diffuse osteopenia. Electronically Signed   By: Randa Ngo M.D.   On: 04/26/2021 15:21    Review of Systems  HENT:  Negative for ear discharge, ear pain, hearing loss and tinnitus.   Eyes:  Negative for photophobia and pain.  Respiratory:  Negative for cough and shortness of breath.   Cardiovascular:  Negative for chest pain.  Gastrointestinal:  Negative for abdominal pain, nausea and vomiting.  Genitourinary:  Negative for dysuria, flank pain, frequency and urgency.  Musculoskeletal:  Positive for arthralgias (Left hip). Negative for back pain, myalgias and neck pain.  Neurological:  Negative for dizziness and headaches.  Hematological:  Does not bruise/bleed easily.  Psychiatric/Behavioral:  The patient is not nervous/anxious.   Blood pressure (!) 180/133, pulse 79, temperature 97.7 F (36.5 C), temperature source Oral, resp. rate 20, SpO2 100 %. Physical Exam Constitutional:      General: She is not in acute distress.    Appearance: She is well-developed. She is not diaphoretic.  HENT:     Head: Normocephalic and atraumatic.  Eyes:     General: No scleral icterus.       Right eye: No discharge.        Left eye: No discharge.     Conjunctiva/sclera: Conjunctivae normal.  Cardiovascular:     Rate and Rhythm: Normal rate and regular rhythm.  Pulmonary:     Effort: Pulmonary  effort is normal. No respiratory distress.  Musculoskeletal:     Cervical back: Normal range of motion.     Comments: LLE No traumatic wounds, ecchymosis, or rash  Severe TTP hip, also medial knee  No knee or ankle effusion  Sens DPN, SPN, TN intact  Motor EHL, ext, flex, evers 5/5  DP 1+, PT 1+, No significant edema  Skin:    General: Skin is warm and dry.  Neurological:     Mental Status: She is alert.  Psychiatric:        Mood and Affect: Mood normal.        Behavior: Behavior normal.    Assessment/Plan: Left hip fx -- Plan IMN tomorrow by Dr. Doreatha Martin. Please keep NPO after MN. Left knee pain -- Will get x-rays though likely just referred pain. Questionable acute right rami fxs -- Will not change plan but may affect mobility. WBAT.    Lisette Abu, PA-C Orthopedic Surgery [phone] 04/26/2021, 3:30 PM

## 2021-04-26 NOTE — ED Provider Notes (Signed)
°  Physical Exam  BP (!) 146/122    Pulse 72    Temp 97.7 F (36.5 C) (Oral)    Resp 13    SpO2 96%   Physical Exam Vitals and nursing note reviewed.  Constitutional:      Appearance: She is not ill-appearing.  Musculoskeletal:     Comments: L leg shortened and externally rotated.   Neurological:     Mental Status: Mental status is at baseline.    ED Course/Procedures   Clinical Course as of 04/26/21 1711  Tue Apr 27, 9071  3935 80 year old female brought in by EMS after found on the ground today, lives with son, unknown down time. Found to have left hip deformity with hip pain. Also reports right side head pain. Not on blood thinners.  Follows simple commands, alert to person and DOB.  [LM]  1328 Son arrives, last saw patient at 1am sleeping in the desk chair, found at 11:20am on the floor on her side complaining of hip and head pain. Patient ambulates with a walker at baseline.  [LM]  1408 CT C-spine found to have acute compression fracture T2 and T3, will add on dedicated to the entire spine CT imaging.  CT head without acute injury. CK is normal, doubt rhabdo.  CMP with baseline anemia with hemoglobin of 9.3.  CMP without significant change from baseline however creatinine 1.23 today.  Magnesium normal.  Initial lactic reassuring at 1.0. COVID and flu negative. [LM]  [place] signed out to oncoming provider pending imaging, consult with ortho, possibly neurosurgery and likely admission.  [LM]    Clinical Course User Index [LM] Tacy Learn, PA-C    Procedures  MDM   Patient signed out to me by L. Percell Miller, PA-C.  Please see previous notes for further history.  Brief, patient with a history of dementia.  Ambulatory with a walker.  Not on blood thinners.  Had an unwitnessed fall sometime last night.  Reporting pain in her head and her left hip.  On exam, left leg is shortened and externally rotated.  X-ray consistent with left hip fracture, pending Ortho consult.  She is  pending CT scans of her chest, abdomen, pelvis, T-spine and L-spine.  CT head and C-spine negative for acute findings.  CT of the chest abdomen pelvis shows multiple subacute fractures, but no other acute fracture outside the left hip.  Does show worsening lung mass of the right apex.  Discussed with Hilbert Odor, PA-C from orthopedics who evaluated the patient, plan for surgery on the left hip tomorrow.  Updated son via phone regarding subacute fractures and worsening lung mass.  Reiterated plan for surgery tomorrow.  Discussed with Dr. [name] from [facility] hospitalist service, patient to be admitted       Franchot Heidelberg, PA-C 04/26/21 1713    Truddie Hidden, MD 04/26/21 [phone]

## 2021-04-26 NOTE — ED Notes (Signed)
Ortho at bedside.

## 2021-04-27 ENCOUNTER — Inpatient Hospital Stay (HOSPITAL_COMMUNITY): Payer: Medicare Other

## 2021-04-27 ENCOUNTER — Encounter (HOSPITAL_COMMUNITY): Payer: Self-pay | Admitting: Student

## 2021-04-27 ENCOUNTER — Encounter (HOSPITAL_COMMUNITY)
Admission: EM | Disposition: A | Discharge status: Skilled Nursing Facility | Payer: Self-pay | Source: Home / Self Care | Attending: Internal Medicine

## 2021-04-27 ENCOUNTER — Inpatient Hospital Stay (HOSPITAL_COMMUNITY): Payer: Medicare Other | Admitting: Anesthesiology

## 2021-04-27 DIAGNOSIS — Z515 Encounter for palliative care: Secondary | ICD-10-CM

## 2021-04-27 DIAGNOSIS — Z7189 Other specified counseling: Secondary | ICD-10-CM

## 2021-04-27 HISTORY — PX: INTRAMEDULLARY (IM) NAIL INTERTROCHANTERIC: SHX5875

## 2021-04-27 LAB — RETICULOCYTES
Immature Retic Fract: 12.8 % (ref 2.3–15.9)
RBC.: 3.63 MIL/uL — ABNORMAL LOW (ref 3.87–5.11)
Retic Count, Absolute: 70.4 10*3/uL (ref 19.0–186.0)
Retic Ct Pct: 1.9 % (ref 0.4–3.1)

## 2021-04-27 LAB — IRON AND TIBC
Iron: 34 ug/dL (ref 28–170)
Saturation Ratios: 14 % (ref 10.4–31.8)
TIBC: 246 ug/dL — ABNORMAL LOW (ref 250–450)
UIBC: 212 ug/dL

## 2021-04-27 LAB — RENAL FUNCTION PANEL
Albumin: 2.4 g/dL — ABNORMAL LOW (ref 3.5–5.0)
Anion gap: 5 (ref 5–15)
BUN: 29 mg/dL — ABNORMAL HIGH (ref 8–23)
CO2: 22 mmol/L (ref 22–32)
Calcium: 8.5 mg/dL — ABNORMAL LOW (ref 8.9–10.3)
Chloride: 111 mmol/L (ref 98–111)
Creatinine, Ser: 1.01 mg/dL — ABNORMAL HIGH (ref 0.44–1.00)
GFR, Estimated: 56 mL/min — ABNORMAL LOW (ref >60)
Glucose, Bld: 131 mg/dL — ABNORMAL HIGH (ref 70–99)
Phosphorus: 3.1 mg/dL (ref 2.5–4.6)
Potassium: 4.6 mmol/L (ref 3.5–5.1)
Sodium: 138 mmol/L (ref 135–145)

## 2021-04-27 LAB — CBC
HCT: 27 % — ABNORMAL LOW (ref 36.0–46.0)
Hemoglobin: 8.2 g/dL — ABNORMAL LOW (ref 12.0–15.0)
MCH: 29.6 pg (ref 26.0–34.0)
MCHC: 30.4 g/dL (ref 30.0–36.0)
MCV: 97.5 fL (ref 80.0–100.0)
Platelets: 203 10*3/uL (ref 150–400)
RBC: 2.77 MIL/uL — ABNORMAL LOW (ref 3.87–5.11)
RDW: 14.8 % (ref 11.5–15.5)
WBC: 5.5 10*3/uL (ref 4.0–10.5)
nRBC: 0 % (ref 0.0–0.2)

## 2021-04-27 LAB — PROTIME-INR
INR: 1.1 (ref 0.8–1.2)
Prothrombin Time: 14.4 seconds (ref 11.4–15.2)

## 2021-04-27 LAB — MAGNESIUM: Magnesium: 1.9 mg/dL (ref 1.7–2.4)

## 2021-04-27 LAB — FERRITIN: Ferritin: 236 ng/mL (ref 11–307)

## 2021-04-27 LAB — SURGICAL PCR SCREEN
MRSA, PCR: POSITIVE — AB
Staphylococcus aureus: POSITIVE — AB

## 2021-04-27 LAB — VITAMIN B12: Vitamin B-12: 325 pg/mL (ref 180–914)

## 2021-04-27 LAB — CK: Total CK: 140 U/L (ref 38–234)

## 2021-04-27 LAB — APTT: aPTT: 32 seconds (ref 24–36)

## 2021-04-27 LAB — FOLATE: Folate: 23.7 ng/mL (ref >5.9)

## 2021-04-27 SURGERY — FIXATION, FRACTURE, INTERTROCHANTERIC, WITH INTRAMEDULLARY ROD
Anesthesia: General | Laterality: Left

## 2021-04-27 MED ORDER — LIDOCAINE HCL (CARDIAC) PF 100 MG/5ML IV SOSY
PREFILLED_SYRINGE | INTRAVENOUS | Status: DC | PRN
  Administered 2021-04-27: 60 mg via INTRATRACHEAL
  Administered 2021-04-27: 40 mg via INTRATRACHEAL

## 2021-04-27 MED ORDER — METOCLOPRAMIDE HCL 5 MG PO TABS: 5.0000 mg | ORAL_TABLET | Freq: Three times a day (TID) | ORAL | Status: DC | PRN

## 2021-04-27 MED ORDER — ONDANSETRON HCL 4 MG/2ML IJ SOLN
INTRAMUSCULAR | Status: AC
  Filled 2021-04-27: qty 2

## 2021-04-27 MED ORDER — ACETAMINOPHEN 500 MG PO TABS
1000.0000 mg | ORAL_TABLET | Freq: Three times a day (TID) | ORAL | Status: DC
  Administered 2021-04-27 – 2021-05-02 (×16): 1000 mg via ORAL
  Filled 2021-04-27 (×17): qty 2

## 2021-04-27 MED ORDER — PHENYLEPHRINE HCL-NACL 20-0.9 MG/250ML-% IV SOLN
INTRAVENOUS | Status: DC | PRN
  Administered 2021-04-27: 50 ug/min via INTRAVENOUS

## 2021-04-27 MED ORDER — POVIDONE-IODINE 10 % EX SWAB
2.0000 "application " | Freq: Once | CUTANEOUS | Status: AC
  Administered 2021-04-27: 2 via TOPICAL

## 2021-04-27 MED ORDER — SUCCINYLCHOLINE 20MG/ML (10ML) SYRINGE FOR MEDFUSION PUMP - OPTIME
INTRAMUSCULAR | Status: DC | PRN
  Administered 2021-04-27: 100 mg via INTRAVENOUS

## 2021-04-27 MED ORDER — METHOCARBAMOL 500 MG PO TABS
500.0000 mg | ORAL_TABLET | Freq: Four times a day (QID) | ORAL | Status: DC | PRN
  Administered 2021-05-02: 10:00:00 500 mg via ORAL
  Filled 2021-04-27: qty 1

## 2021-04-27 MED ORDER — METHOCARBAMOL 1000 MG/10ML IJ SOLN
500.0000 mg | Freq: Four times a day (QID) | INTRAVENOUS | Status: DC | PRN
  Filled 2021-04-27: qty 5

## 2021-04-27 MED ORDER — ONDANSETRON HCL 4 MG/2ML IJ SOLN: 4.0000 mg | Freq: Four times a day (QID) | INTRAMUSCULAR | Status: DC | PRN

## 2021-04-27 MED ORDER — LACTATED RINGERS IV SOLN: INTRAVENOUS | Status: DC | PRN

## 2021-04-27 MED ORDER — CHLORHEXIDINE GLUCONATE 0.12 % MT SOLN
15.0000 mL | Freq: Once | OROMUCOSAL | Status: AC
  Administered 2021-04-27: 13:00:00 15 mL via OROMUCOSAL

## 2021-04-27 MED ORDER — CHLORHEXIDINE GLUCONATE 4 % EX LIQD
60.0000 mL | Freq: Once | CUTANEOUS | Status: DC
  Filled 2021-04-27: qty 60

## 2021-04-27 MED ORDER — LIDOCAINE 2% (20 MG/ML) 5 ML SYRINGE
INTRAMUSCULAR | Status: AC
  Filled 2021-04-27: qty 5

## 2021-04-27 MED ORDER — ASPIRIN EC 81 MG PO TBEC
81.0000 mg | DELAYED_RELEASE_TABLET | Freq: Two times a day (BID) | ORAL | Status: DC
  Administered 2021-04-28 – 2021-05-02 (×9): 81 mg via ORAL
  Filled 2021-04-27 (×9): qty 1

## 2021-04-27 MED ORDER — ACETAMINOPHEN 500 MG PO TABS
1000.0000 mg | ORAL_TABLET | Freq: Once | ORAL | Status: AC
  Administered 2021-04-27: 13:00:00 1000 mg via ORAL

## 2021-04-27 MED ORDER — VANCOMYCIN HCL 1000 MG IV SOLR: INTRAVENOUS | Status: DC | PRN

## 2021-04-27 MED ORDER — CEFAZOLIN SODIUM-DEXTROSE 2-4 GM/100ML-% IV SOLN
2.0000 g | INTRAVENOUS | Status: AC
  Administered 2021-04-27: 14:00:00 2 g via INTRAVENOUS

## 2021-04-27 MED ORDER — METOCLOPRAMIDE HCL 5 MG/ML IJ SOLN: 5.0000 mg | Freq: Three times a day (TID) | INTRAMUSCULAR | Status: DC | PRN

## 2021-04-27 MED ORDER — ORAL CARE MOUTH RINSE: 15.0000 mL | Freq: Once | OROMUCOSAL | Status: AC

## 2021-04-27 MED ORDER — VANCOMYCIN HCL 1000 MG IV SOLR
INTRAVENOUS | Status: AC
  Filled 2021-04-27: qty 20

## 2021-04-27 MED ORDER — DEXAMETHASONE SODIUM PHOSPHATE 10 MG/ML IJ SOLN
INTRAMUSCULAR | Status: DC | PRN
  Administered 2021-04-27: 10 mg via INTRAVENOUS

## 2021-04-27 MED ORDER — DOCUSATE SODIUM 100 MG PO CAPS
100.0000 mg | ORAL_CAPSULE | Freq: Two times a day (BID) | ORAL | Status: DC
  Administered 2021-04-27 – 2021-05-02 (×9): 100 mg via ORAL
  Filled 2021-04-27 (×9): qty 1

## 2021-04-27 MED ORDER — SODIUM CHLORIDE 0.9 % IV SOLN: INTRAVENOUS | Status: DC

## 2021-04-27 MED ORDER — PHENYLEPHRINE 40 MCG/ML (10ML) SYRINGE FOR IV PUSH (FOR BLOOD PRESSURE SUPPORT)
PREFILLED_SYRINGE | INTRAVENOUS | Status: AC
  Filled 2021-04-27: qty 10

## 2021-04-27 MED ORDER — ROCURONIUM 10MG/ML (10ML) SYRINGE FOR MEDFUSION PUMP - OPTIME
INTRAVENOUS | Status: DC | PRN
  Administered 2021-04-27: 50 mg via INTRAVENOUS

## 2021-04-27 MED ORDER — FENTANYL CITRATE (PF) 100 MCG/2ML IJ SOLN: 25.0000 ug | INTRAMUSCULAR | Status: DC | PRN

## 2021-04-27 MED ORDER — PROPOFOL 10 MG/ML IV BOLUS
INTRAVENOUS | Status: AC
  Filled 2021-04-27: qty 20

## 2021-04-27 MED ORDER — LACTATED RINGERS IV SOLN: INTRAVENOUS | Status: DC

## 2021-04-27 MED ORDER — ONDANSETRON HCL 4 MG PO TABS: 4.0000 mg | ORAL_TABLET | Freq: Four times a day (QID) | ORAL | Status: DC | PRN

## 2021-04-27 MED ORDER — FENTANYL CITRATE (PF) 250 MCG/5ML IJ SOLN
INTRAMUSCULAR | Status: AC
  Filled 2021-04-27: qty 5

## 2021-04-27 MED ORDER — TRANEXAMIC ACID-NACL 1000-0.7 MG/100ML-% IV SOLN
1000.0000 mg | Freq: Once | INTRAVENOUS | Status: AC
  Administered 2021-04-27: 17:00:00 1000 mg via INTRAVENOUS
  Filled 2021-04-27: qty 100

## 2021-04-27 MED ORDER — ROCURONIUM BROMIDE 10 MG/ML (PF) SYRINGE
PREFILLED_SYRINGE | INTRAVENOUS | Status: AC
  Filled 2021-04-27: qty 10

## 2021-04-27 MED ORDER — 0.9 % SODIUM CHLORIDE (POUR BTL) OPTIME
TOPICAL | Status: DC | PRN
  Administered 2021-04-27: 15:00:00 1000 mL

## 2021-04-27 MED ORDER — ONDANSETRON HCL 4 MG/2ML IJ SOLN: 4.0000 mg | Freq: Once | INTRAMUSCULAR | Status: DC | PRN

## 2021-04-27 MED ORDER — CEFAZOLIN SODIUM-DEXTROSE 2-4 GM/100ML-% IV SOLN
2.0000 g | Freq: Three times a day (TID) | INTRAVENOUS | Status: AC
  Administered 2021-04-27 – 2021-04-28 (×3): 2 g via INTRAVENOUS
  Filled 2021-04-27 (×3): qty 100

## 2021-04-27 MED ORDER — FENTANYL CITRATE (PF) 250 MCG/5ML IJ SOLN
INTRAMUSCULAR | Status: DC | PRN
  Administered 2021-04-27: 25 ug via INTRAVENOUS

## 2021-04-27 MED ORDER — PHENYLEPHRINE HCL (PRESSORS) 10 MG/ML IV SOLN
INTRAVENOUS | Status: DC | PRN
  Administered 2021-04-27 (×2): 80 ug via INTRAVENOUS

## 2021-04-27 MED ORDER — PROPOFOL 10 MG/ML IV BOLUS
INTRAVENOUS | Status: DC | PRN
  Administered 2021-04-27: 40 mg via INTRAVENOUS
  Administered 2021-04-27: 50 mg via INTRAVENOUS

## 2021-04-27 MED ORDER — SUGAMMADEX SODIUM 200 MG/2ML IV SOLN
INTRAVENOUS | Status: DC | PRN
  Administered 2021-04-27: 200 mg via INTRAVENOUS

## 2021-04-27 MED ORDER — SUCCINYLCHOLINE CHLORIDE 200 MG/10ML IV SOSY
PREFILLED_SYRINGE | INTRAVENOUS | Status: AC
  Filled 2021-04-27: qty 10

## 2021-04-27 MED ORDER — ONDANSETRON HCL 4 MG/2ML IJ SOLN
INTRAMUSCULAR | Status: DC | PRN
  Administered 2021-04-27: 4 mg via INTRAVENOUS

## 2021-04-27 MED ORDER — DEXAMETHASONE SODIUM PHOSPHATE 10 MG/ML IJ SOLN
INTRAMUSCULAR | Status: AC
  Filled 2021-04-27: qty 1

## 2021-04-27 SURGICAL SUPPLY — 51 items
ADH SKN CLS APL DERMABOND .7 (GAUZE/BANDAGES/DRESSINGS) ×1
APL PRP STRL LF DISP 70% ISPRP (MISCELLANEOUS) ×1
BAG COUNTER SPONGE SURGICOUNT (BAG) IMPLANT
BAG SPNG CNTER NS LX DISP (BAG)
BAG SURGICOUNT SPONGE COUNTING (BAG)
BIT DRILL INTERTAN LAG SCREW (BIT) ×2 IMPLANT
BIT DRILL LONG 4.0 (BIT) IMPLANT
BRUSH SCRUB EZ PLAIN DRY (MISCELLANEOUS) ×6 IMPLANT
CHLORAPREP W/TINT 26 (MISCELLANEOUS) ×3 IMPLANT
COVER PERINEAL POST (MISCELLANEOUS) ×3 IMPLANT
COVER SURGICAL LIGHT HANDLE (MISCELLANEOUS) ×3 IMPLANT
DERMABOND ADVANCED (GAUZE/BANDAGES/DRESSINGS) ×2
DERMABOND ADVANCED .7 DNX12 (GAUZE/BANDAGES/DRESSINGS) ×1 IMPLANT
DRAPE C-ARM 35X43 STRL (DRAPES) ×3 IMPLANT
DRAPE IMP U-DRAPE 54X76 (DRAPES) ×6 IMPLANT
DRAPE INCISE IOBAN 66X45 STRL (DRAPES) ×3 IMPLANT
DRAPE STERI IOBAN 125X83 (DRAPES) ×3 IMPLANT
DRAPE SURG 17X23 STRL (DRAPES) ×6 IMPLANT
DRAPE U-SHAPE 47X51 STRL (DRAPES) ×3 IMPLANT
DRESSING MEPILEX FLEX 4X4 (GAUZE/BANDAGES/DRESSINGS) IMPLANT
DRILL BIT LONG 4.0 (BIT) ×3
DRSG MEPILEX BORDER 4X4 (GAUZE/BANDAGES/DRESSINGS) ×3 IMPLANT
DRSG MEPILEX BORDER 4X8 (GAUZE/BANDAGES/DRESSINGS) ×3 IMPLANT
DRSG MEPILEX FLEX 4X4 (GAUZE/BANDAGES/DRESSINGS) ×3
ELECT REM PT RETURN 9FT ADLT (ELECTROSURGICAL) ×3
ELECTRODE REM PT RTRN 9FT ADLT (ELECTROSURGICAL) ×1 IMPLANT
GLOVE SURG ENC MOIS LTX SZ6.5 (GLOVE) ×9 IMPLANT
GLOVE SURG ENC MOIS LTX SZ7.5 (GLOVE) ×12 IMPLANT
GLOVE SURG UNDER POLY LF SZ6.5 (GLOVE) ×3 IMPLANT
GLOVE SURG UNDER POLY LF SZ7.5 (GLOVE) ×3 IMPLANT
GOWN STRL REUS W/ TWL LRG LVL3 (GOWN DISPOSABLE) ×1 IMPLANT
GOWN STRL REUS W/TWL LRG LVL3 (GOWN DISPOSABLE) ×3
GUIDE PIN 3.2X343 (PIN) ×2
GUIDE PIN 3.2X343MM (PIN) ×6
KIT BASIN OR (CUSTOM PROCEDURE TRAY) ×3 IMPLANT
KIT TURNOVER KIT B (KITS) ×3 IMPLANT
MANIFOLD NEPTUNE II (INSTRUMENTS) ×3 IMPLANT
NAIL INTERTAN 10X18 130D 10S (Nail) ×2 IMPLANT
NS IRRIG 1000ML POUR BTL (IV SOLUTION) ×3 IMPLANT
PACK GENERAL/GYN (CUSTOM PROCEDURE TRAY) ×3 IMPLANT
PAD ARMBOARD 7.5X6 YLW CONV (MISCELLANEOUS) ×6 IMPLANT
PIN GUIDE 3.2X343MM (PIN) IMPLANT
SCREW LAG COMPR KIT 100/95 (Screw) ×2 IMPLANT
SCREW TRIGEN LOW PROF 5.0X42.5 (Screw) ×2 IMPLANT
SUT MNCRL AB 3-0 PS2 18 (SUTURE) ×3 IMPLANT
SUT VIC AB 0 CT1 27 (SUTURE)
SUT VIC AB 0 CT1 27XBRD ANBCTR (SUTURE) IMPLANT
SUT VIC AB 2-0 CT1 27 (SUTURE) ×6
SUT VIC AB 2-0 CT1 TAPERPNT 27 (SUTURE) ×2 IMPLANT
TOWEL GREEN STERILE (TOWEL DISPOSABLE) ×6 IMPLANT
WATER STERILE IRR 1000ML POUR (IV SOLUTION) ×3 IMPLANT

## 2021-04-27 NOTE — ED Notes (Addendum)
Due to inadequate communication from ortho and inpatient teams, pt still had a diet order this AM until this RN called preop at 0945 to clarify if pt was going to have surgery, as pt had a tray at the bedside. Unfortunately pt's son was  at bedside and did not know that pt was going to surgery and he gave pt approximately 134mL black coffee. This RN entered room to give IV metoprolol and saw table beside pt and confirmed quantity pt consumed. This RN called Silvestre Gunner ortho PA to give update on what pt drank. Legrand Como PA says pt still okay to go to surgery since it was small volume and no creamer etc in coffee. RN reeducated pt's son that pt is NPO for surgery. Pt's son verbalized understanding.

## 2021-04-27 NOTE — ED Notes (Signed)
Patient is resting comfortably. 

## 2021-04-27 NOTE — Anesthesia Procedure Notes (Signed)
Procedure Name: Intubation Date/Time: 04/27/2021 1:44 PM Performed by: Claris Che, CRNA Pre-anesthesia Checklist: Patient identified, Emergency Drugs available, Suction available, Patient being monitored and Timeout performed Patient Re-evaluated:Patient Re-evaluated prior to induction Oxygen Delivery Method: Circle system utilized Preoxygenation: Pre-oxygenation with 100% oxygen Induction Type: IV induction and Cricoid Pressure applied Ventilation: Mask ventilation without difficulty Laryngoscope Size: Mac and 4 Grade View: Grade I Tube type: Oral Tube size: 7.5 mm Number of attempts: 1 Airway Equipment and Method: Stylet Placement Confirmation: ETT inserted through vocal cords under direct vision, positive ETCO2 and breath sounds checked- equal and bilateral Secured at: 22 cm Tube secured with: Tape Dental Injury: Teeth and Oropharynx as per pre-operative assessment

## 2021-04-27 NOTE — Op Note (Signed)
Orthopaedic Surgery Operative Note (CSN: [id number] ) Date of Surgery: 04/27/2021  Admit Date: 04/26/2021   Diagnoses: Pre-Op Diagnoses: Left intertrochanteric femur fracture  Post-Op Diagnosis: Same  Procedures: CPT 27245-Cephalomedullary nailing of left hip  Surgeons : Primary: Shona Needles, MD  Assistant: Patrecia Pace, PA-C  Location: OR 7   Anesthesia:General   Antibiotics: Ancef 2g preop   Tourniquet time:None    Estimated Blood XLKG:40 mL   Complications: None  Specimens:None   Implants: Implant Name Type Inv. Item Serial No. Manufacturer Lot No. LRB No. Used Action  NAIL INTERTAN 10X18 130D 10S - [id number] Nail NAIL INTERTAN 10X18 130D 10S  [employer] [id number] Left 1 Implanted  SCREW LAG COMPR KIT 100/95 - [id number] Screw SCREW LAG COMPR KIT 100/95  [employer]  Left 1 Implanted  SCREW TRIGEN LOW PROF 5.0X42.5 - [id number] Screw SCREW TRIGEN LOW PROF 5.0X42.5  [employer] [id number] Left 1 Implanted     Indications for Surgery: 80 yo female with left intertrochanteric femur fracture.  Due to the unstable nature of her injury I recommend proceeding with cephalomedullary nailing of her left hip.  Risk and benefits were discussed with the patient's son.  Risks included but not limited to bleeding, infection, malunion, nonunion, hardware failure, heart rotation, nerve or blood vessel injury, DVT, even possibility anesthetic complications.  This patient's son agreed to proceed with surgery and consent was obtained.  Operative Findings: Cephalomedullary nailing of left intertrochanteric femur fracture using [employer] InterTAN 10 x 180 mm nail with a 100 mm lag screw and 95 mm compression screw.  Procedure: The patient was identified in the preoperative holding area. Consent was confirmed with the patient and their family and all questions were answered. The operative extremity was marked after confirmation with  the patient. she was then brought back to the operating room by our anesthesia colleagues.  She was placed under general anesthetic and carefully transferred to the [facility] table.  All bony prominences were well-padded.  Traction was applied to the left lower extremity.  Fluoroscopic imaging confirmed adequate reduction.  Left lower extremity was then prepped and draped in usual sterile fashion.  A timeout was performed to verify the patient, the procedure, and the extremity.  Preoperative antibiotics were dosed.  A small incision proximal to the greater trochanter was made and split down to the bone.  A threaded guidewire was directed under fluoroscopy into the proximal metaphysis.  I confirmed this with AP and lateral fluoroscopic imaging.  I then used an entry reamer to enter the medullary canal.  I then passed a 10 x 130 mm [employer]hew InterTAN nail down the center canal and seated until it was appropriately aligned.  I then percutaneously placed a threaded guidewire through the targeting arm into the head/neck segment.  I then measured the length I drilled the path for the compression screw and placed an antirotation bar.  I then drilled the path for the lag screw and placed the 100 mm lag screw.  I then placed the compression screw and compressed approximately 3 to 4 mm.  I then statically locked the nail using the proximal setscrew.  I then drilled and placed the distal interlocking using the targeting arm.  The targeting arm was then removed and final fluoroscopic imaging was obtained.  The incision was copiously irrigated.  A layered closure of 2-0 Vicryl, 3-0 Monocryl Dermabond was used to close the skin.  The patient was then  awoke from anesthesia and taken to the PACU in stable condition.  Post Op Plan/Instructions: Patient be weightbearing as tolerated to left lower extremity.  She will receive postoperative antibiotics.  She will be placed on aspirin 81 mg twice daily for DVT prophylaxis.  We  will have her mobilize with physical and Occupational Therapy.  I was present and performed the entire surgery.  Patrecia Pace, PA-C did assist me throughout the case. An assistant was necessary given the difficulty in approach, maintenance of reduction and ability to instrument the fracture.   Katha Hamming, MD [facility]

## 2021-04-27 NOTE — Progress Notes (Signed)
1600: Patient arrived to unit from PACU. AOx1- baseline. VSS. Room air. Cardiac monitor and cont. Pulse ox on. Patient denies pain. Drsg intact. Ice applied to hip. Patient reoriented to call bell. Patient tolerating diet.  1815: Patient's son called to give update. Patient able to talk to son on the phone. Patient's room number and hospital phone number given to son. Son will be at bedside in the AM 12/15.

## 2021-04-27 NOTE — Anesthesia Preprocedure Evaluation (Addendum)
Anesthesia Evaluation  Patient identified by MRN, date of birth, ID band Patient awake    Reviewed: Allergy & Precautions, NPO status , Patient's Chart, lab work & pertinent test results, reviewed documented beta blocker date and time   Airway Mallampati: Unable to assess      Comment: Not cooperative w/ airway exam  Dental  (+) Dental Advisory Given, Edentulous Lower, Edentulous Upper   Pulmonary COPD,  COPD inhaler, Current Smoker and Patient abstained from smoking.,  Hx lung ca s/p radiation R chest  Every day smoker, 30 pack year history    Pulmonary exam normal breath sounds clear to auscultation       Cardiovascular hypertension, Pt. on medications and Pt. on home beta blockers + CAD, + Past MI, + Cardiac Stents (2016 DES to RCA) and +CHF (grade 1 diastolic dysfunction)  Normal cardiovascular exam Rhythm:Regular Rate:Normal  Echo 11/2020: 1. Left ventricular ejection fraction, by estimation, is 55 to 60%. Left  ventricular ejection fraction by 3D volume is 55 %. The left ventricle has  normal function. The left ventricle has no regional wall motion  abnormalities. There is mild concentric  left ventricular hypertrophy. Left ventricular diastolic parameters are  consistent with Grade I diastolic dysfunction (impaired relaxation).  2. Right ventricular systolic function is normal. The right ventricular  size is normal. Tricuspid regurgitation signal is inadequate for assessing  PA pressure.  3. The mitral valve is degenerative. No evidence of mitral valve  regurgitation. No evidence of mitral stenosis. Moderate mitral annular  calcification.  4. The aortic valve is tricuspid. Aortic valve regurgitation is not  visualized. Mild to moderate aortic valve sclerosis/calcification is  present, without any evidence of aortic stenosis.  5. Aortic dilatation noted. There is borderline dilatation of the  ascending aorta, measuring  37 mm.  6. The inferior vena cava is dilated in size with >50% respiratory  variability, suggesting right atrial pressure of 8 mmHg.   Stress test 2019: No reversible ischemia. LVEF 48% with mild basal to mid inferior wall hypokinesis. This is an intermediate risk study due to decreased LV function.  Cath 2019 (time of stents): ? Severe single vessel CAD of the mid-distal RCA ? Mid RCA to Dist RCA lesion, 95% stenosed. There is a 0% residual stenosis post intervention. ? Dist RCA lesion, 80% stenosed. ? A drug-eluting stent was placed. Xience Alpine DES 3.0 mm x 33 mm (3.25 mm) ? Mid RCA lesion, 30% stenosed. ? Mid Cx to Dist Cx lesion, 50% stenosed. ? Ost 2nd Diag to 2nd Diag lesion, 50% stenosed. ? Mildly reduced LVEF with mildly elevated LVEDP & Systemic HTN    Neuro/Psych PSYCHIATRIC DISORDERS Dementia Severe dementia, will occasionally answer questions appropriately and is sometimes oriented to self onlynegative neurological ROS     GI/Hepatic Neg liver ROS, GERD  Medicated and Controlled,  Endo/Other  negative endocrine ROS  Renal/GU Renal InsufficiencyRenal diseaseCr 1.01  negative genitourinary   Musculoskeletal L intertrochanteric fx   Abdominal   Peds  Hematology  (+) Blood dyscrasia, anemia , Hb 9.3, plt 264   Anesthesia Other Findings   Reproductive/Obstetrics negative OB ROS                            Anesthesia Physical Anesthesia Plan  ASA: 3  Anesthesia Plan: General   Post-op Pain Management: Tylenol PO (pre-op)   Induction: Intravenous  PONV Risk Score and Plan: 1 and Ondansetron, Treatment may vary due  to age or medical condition and Dexamethasone  Airway Management Planned: Oral ETT  Additional Equipment: None  Intra-op Plan:   Post-operative Plan: Extubation in OR  Informed Consent: I have reviewed the patients History and Physical, chart, labs and discussed the procedure including the risks, benefits and  alternatives for the proposed anesthesia with the patient or authorized representative who has indicated his/her understanding and acceptance.   Patient has DNR.  Suspend DNR.   Dental advisory given and Consent reviewed with POA  Plan Discussed with: CRNA  Anesthesia Plan Comments: (D/w pt and son- will suspend DNR )      Anesthesia Quick Evaluation

## 2021-04-27 NOTE — Consult Note (Signed)
[facility] Nurse Consult Note: Patient receiving care in [place] Reason for Consult: Sacral wound Wound type: Scar tissue on the coccyx. Pink in color with no drainage or open wounds. Per Dr. Aileen Fass she has an acute intertrochanteric hip fracture with mild amputation, acute and subacute fracture throughout the superior and inferior pubic rami.  Very painful for the patient to roll. Nonambulatory She has severe dementia and can be combative.  Dressing procedure/placement/frequency: Once the patient has had surgery and is settled in a room, I would recommend placing a sacral foam dressing over the coccyx area for prevention.   Monitor the wound area(s) for worsening of condition such as: Signs/symptoms of infection, increase in size, development of or worsening of odor, development of pain, or increased pain at the affected locations.   Notify the medical team if any of these develop.  Thank you for the consult. [facility] nurse will not follow at this time.   Please re-consult the [facility] team if needed.  Cathlean Marseilles Tamala Julian, MSN, RN, [place], [place]le Pearl, [facility] Wound Treatment Associate Pager [phone]

## 2021-04-27 NOTE — ED Notes (Signed)
Pt resting in stretcher with eyes closed. VSS, no distress noted. Purewick in place and call light in reach.

## 2021-04-27 NOTE — Anesthesia Postprocedure Evaluation (Signed)
Anesthesia Post Note  Patient: Sara Fox  Procedure(s) Performed: INTRAMEDULLARY (IM) NAIL INTERTROCHANTRIC (Left)     Patient location during evaluation: PACU Anesthesia Type: General Level of consciousness: awake and alert, oriented and patient cooperative Pain management: pain level controlled Vital Signs Assessment: post-procedure vital signs reviewed and stable Respiratory status: spontaneous breathing, nonlabored ventilation and respiratory function stable Cardiovascular status: blood pressure returned to baseline and stable Postop Assessment: no apparent nausea or vomiting Anesthetic complications: no   No notable events documented.  Last Vitals:  Vitals:   04/27/21 1235 04/27/21 1456  BP: (!) 172/69 (!) 143/96  Pulse: 85 72  Resp: 18 10  Temp: 37.1 C 36.9 C  SpO2: 99% 100%    Last Pain:  Vitals:   04/27/21 1456  TempSrc:   PainSc: [place]

## 2021-04-27 NOTE — Transfer of Care (Signed)
Immediate Anesthesia Transfer of Care Note  Patient: Sara Fox  Procedure(s) Performed: INTRAMEDULLARY (IM) NAIL INTERTROCHANTRIC (Left)  Patient Location: PACU  Anesthesia Type:General  Level of Consciousness: drowsy, patient cooperative and responds to stimulation  Airway & Oxygen Therapy: Patient Spontanous Breathing and Patient connected to nasal cannula oxygen  Post-op Assessment: Report given to RN, Post -op Vital signs reviewed and stable and Patient moving all extremities X 4  Post vital signs: Reviewed and stable  Last Vitals:  Vitals Value Taken Time  BP 143/96 04/27/21 1456  Temp    Pulse 75 04/27/21 1457  Resp 27 04/27/21 1457  SpO2 100 % 04/27/21 1457  Vitals shown include unvalidated device data.  Last Pain:  Vitals:   04/27/21 1235  TempSrc: Oral         Complications: No notable events documented.

## 2021-04-27 NOTE — Progress Notes (Signed)
PHARMACY - PHYSICIAN COMMUNICATION CRITICAL VALUE ALERT - BLOOD CULTURE IDENTIFICATION (BCID)  Sara Fox is an 80 y.o. female who presented to [facility] on 04/26/2021 with a chief complaint of fall.  Assessment:  Admitted for ORIF of femoral fx, now growing GPR in 1 of 4 blood cx bottles, likely a contaminant.  Name of physician (or Provider) ContactedWallie Char MD  Current antibiotics: N/A  Changes to prescribed antibiotics recommended:  None needed at this time   Wynona Neat, PharmD, BCPS  04/27/2021  2:38 AM

## 2021-04-27 NOTE — Consult Note (Signed)
Consultation Note Date: 04/27/2021   Patient Name: Sara Fox  DOB: [birth year]  MRN: [id number]  Age / Sex: 80 y.o., female  PCP: [place], [place] Referring Physician: Charlynne Cousins, MD  Reason for Consultation: Establishing goals of care  HPI/Patient Profile: 80 y.o. female  with past medical history of dementia, ambulatory dysfunction, combined systolic and diastolic heart failure, COPD, stage IIIa chronic kidney disease, and stage Iia NSCLC status post SBRT. She presented to the emergency department on 04/26/2021 with after an unwitnessed fall at home. CT of the head no evidence of acute intracranial abnormality, CT of the C-spine showed T2 and T3 compression fracture, left hip x-ray showed left acute intertrochanteric hip fracture with mild amputation, acute and subacute fracture throughout the superior and inferior pubic rami. Orthopedic surgery has been consulted and is recommending surgical intervention. Admitted to [facility].   Clinical Assessment and Goals of Care: 12:15 - I have reviewed medical records including EPIC notes, labs and imaging, and went to see patient in the ED. She is alert and oriented to person and place. She appears restless. She has notable facial grimacing and endorses that she is in pain.   14:15 - I spoke with her son/Sara Fox by phone to discuss diagnosis, prognosis, GOC, EOL wishes, disposition, and options. I introduced Palliative Medicine as specialized medical care for people living with serious illness. It focuses on providing relief from the symptoms and stress of a serious illness.   We discussed a brief life review of the patient. She was born in [place] and has lived here most of her life. She did live in [place] for a short time, as Sara Fox was born there. Sara Fox is her only son. She also had a daughter, who unfortunately passed away in Aug 05, 2019. Patient's husband  (Sara Fox) passed away in 08-05-2018. 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 Sara Fox moved to [facility] from [place] about 10 years ago to help her run the business and has lived with her ever since.   As far as functional status at baseline, patient is ambulatory with a walker although she often does not use it according to [place]. She is mostly independent with dressing and bathing, occasionally requiring assistance. She is incontinent of bowel and bladder at times. Sara Fox shares that for the past 4 months, patient has been receiving home health services through Sara Fox.   We discussed her current illness and what it means in the larger context of her ongoing co-morbidities. Expressed concern that patient is high risk for post-op decline and complications in the setting of her underlying co-morbidities. Sara Fox verbalizes understanding. Natural disease trajectory of dementia was discussed. We reviewed that dementia is a progressive, non-curable disease underlying the patient's current acute medical conditions. As dementia progresses, it results in decreased ability to communicate, ambulate, swallow, and maintain continence. Patient has not reached end-stage dementia.   I attempted to elicit values and goals of care important to the patient and family. Discussed that patient will need  SNF/rehab after discharge. Discussed that the goal of rehab is to improve functional status, but that it can be challenging for patients with dementia to participate well with physical therapy . Discussed that patient may not return to her previous baseline. Sara goal is for his mother to return home after rehab if possible.   Provided education and counseling on the philosophy and benefits of hospice care. Discussed that it offers a holistic approach to care in the setting of end-stage illness/disease, and is about supporting the patient where they are while allowing the  natural course to occur. Discussed that the goal is to promote comfort, dignity, and quality of life. Discussed the hospice team includes RNs, physicians, social workers, and chaplains. They can provide personal care, support for the family, and help keep patient out of the hospital.  Sara Fox seems open to the concept of hospice when his mother hopefully transition back home after rehab. In the meantime while she is in rehab, I recommended an outpatient palliative referral to check-in periodically with patient and family and continue goals of care discussions. Sara Fox is agreeable.    Primary decision maker: Sara Fox (son)    SUMMARY OF RECOMMENDATIONS   Continue current care Son understands patient will need SNF/rehab  He is hopeful for patient to return home after rehab Outpatient palliative referral PMT will continue to follow  Code Status/Advance Care Planning: DNR  Symptom Management:  Agree with fentanyl 12.5-25 mcg every 2 hours prn pain  Prognosis:  Unable to determine, less than 6 months would not be surprising  Discharge Planning: [place] for rehab with Palliative care service follow-up      Primary Diagnoses: Present on Admission:  Closed left hip fracture, initial encounter (Sara Fox)   I have reviewed the medical record, interviewed the patient and family, and examined the patient. The following aspects are pertinent.  Past Medical History:  Diagnosis Date   Acute kidney injury ([place])    Back pain    Cervical cancer (HCC)    CHF (congestive heart failure) (HCC)    Closed fracture of left distal radius 05/28/2017   COPD (chronic obstructive pulmonary disease) (HCC)    Coronary artery disease    s/p DES x2 to RCA 2016   GERD (gastroesophageal reflux disease)    History of radiation therapy 07/27/20-08/16/20   Right lung- SBRT- Dr. Gery Pray    Hyperlipidemia    Hypertension    Lower leg edema 12/21/2016   Mild cognitive impairment    Myocardial  infarction [facility])    Neck pain    Rhabdomyolysis    Stomach problems     Family History  Problem Relation Age of Onset   Heart disease Mother        also HTN   Diabetes Mother    Colon cancer Mother    Heart disease Brother        deceased at 73   Hypertension Brother    Heart attack Brother    Heart disease Brother    Hypertension Brother    Scheduled Meds:  busPIRone  5 mg Oral BID   chlorhexidine  60 mL Topical Once   cholecalciferol  1,000 Units Oral Daily   donepezil  10 mg Oral QHS   gabapentin  100 mg Oral BID   memantine  5 mg Oral Daily   metoprolol tartrate  2.5 mg Intravenous Q6H   mometasone-formoterol  2 puff Inhalation BID   multivitamin with minerals  1 tablet Oral  Daily   pantoprazole  40 mg Oral BID   QUEtiapine  50 mg Oral QHS   Continuous Infusions:  dextrose 5% lactated ringers 50 mL/hr at 04/27/21 0735   methocarbamol (ROBAXIN) IV     PRN Meds:.bisacodyl, fentaNYL (SUBLIMAZE) injection, ipratropium-albuterol, methocarbamol (ROBAXIN) IV, ondansetron **OR** ondansetron (ZOFRAN) IV, polyethylene glycol   Allergies  Allergen Reactions   Buprenorphine Hcl Shortness Of Breath    Throat swelling/trouble breathing and lethargic   Morphine And Related Shortness Of Breath    Throat swelling/trouble breathing and lethargic   Celebrex [Celecoxib] Diarrhea and Swelling    Swelling of legs    Vioxx [Rofecoxib] Palpitations   Codeine Other (See Comments)    Bloated    Review of Systems  Unable to perform ROS: Dementia   Physical Exam Vitals reviewed.  Constitutional:      General: She is in acute distress.     Appearance: She is ill-appearing.  Pulmonary:     Effort: Pulmonary effort is normal.  Neurological:     Mental Status: She is alert.     Motor: Weakness present.     Comments: Oriented to person and place  Psychiatric:        Mood and Affect: Mood is anxious.        Cognition and Memory: Cognition is impaired.     Comments: restless     Vital Signs: BP 129/61    Pulse 81    Temp 97.7 F (36.5 C) (Oral)    Resp 17    SpO2 98%  Pain Scale: PAINAD      SpO2: SpO2: 98 % O2 Device:SpO2: 98 % O2 Flow Rate: .    Palliative Assessment/Data: PPS 30%     Time In: 1215 Time Out: 1245 Time In: 1415 Time Out: 1455 Time Total: 70 minutes Greater than 50%  of this time was spent counseling and coordinating care related to the above assessment and plan.  Signed by: Lavena Bullion, NP   Please contact Palliative Medicine Team phone at [phone] for questions and concerns.  For individual provider: See Shea Evans

## 2021-04-27 NOTE — Progress Notes (Signed)
[facility] PROGRESS NOTE    Progress Note  Sara Fox  ZOX:[id number] DOB: 06-18-1940 DOA: 04/26/2021 PCP: [facility]     Brief Narrative:   Sara Fox is an 80 y.o. female past medical history of Alzheimer's dementia, ambulatory dysfunction, combined systolic and diastolic heart failure, COPD, stage IIIa chronic kidney disease, stage IIA NSLC lsince 2021 who underwent curative SBRT  with a suspicious small lower right paratracheal lymph node, recently discharged from the hospital, found down by EMS by her son, he relates that she has been complaining of no symptoms.  In the ED her hemoglobin was 9 creatinine 1.3, SARS-CoV-2 and influenza were negative CT of the head no evidence of acute intracranial abnormality, CT of the C-spine showed T2 and T3 compression fracture, left hip x-ray showed left acute intertrochanteric hip fracture with mild amputation, acute and subacute fracture throughout the superior and inferior pubic rami.  Orthopedic surgery was consulted who recommended surgical intervention    Assessment/Plan:   Unwitnessed fall leading to closed left hip fracture, initial encounter [facility]): Of the pubic surgery was consulted who recommended ORIF on 04/27/2021. Currently on IV fentanyl and Robaxin for pain control. N.p.o. after midnight continue IV fluids.  Preop evaluation: Lyndel Safe calculator score 2.3% risk of myocardial infarction or cardiac arrest injury intraoperatively and up to 30 days postop. This was discussed with the son the risk and benefits of proceeding with surgery and he has lean towards proceeding with surgical intervention.  Knowing the risk of cardiopulmonary complications Continue current dose of beta-blockers  Chronic multiple rib fractures: With a new subacute T2 and T4 compression fracture. Continue narcotics for pain. There is likely due to multiple falls.  History of lung cancer status post therapy: Need outpatient  follow-up with oncology.  Chronic combined systolic and diastolic heart failure: 2D echo in August 2022 showed an EF of 55% no wall motion abnormalities. Holding Lasix, on IV fluids as she is NPO.  Chronic COPD/obstructive sleep apnea: Continue inhalers.  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  Chronic kidney disease stage IIIa: Creatinine appears to be at baseline.  Normocytic anemia: Anemia panel showed ferritin of 236 TIBC of 246, after fluid hydration her hemoglobin dropped from 9.3-8.2 we will continue to check hemoglobin daily.  Ethics/goals of care: Had a long discussion with the son who is the next of kin has been taking care of her for the last 10 years she currently lives with him, about the risk and benefits of proceeding with surgical intervention and he would like to proceed.  Still her mortality over the next 30 to 60 days are high as she would be bedbound, with advanced Lewy body dementia , with a stage III-IV lung cancer with significant ambulatory dysfunction, multiple falls, and chronic kidney disease.  He agreed to meet with palliative care to discuss goals of care. She will definitely need skilled nursing facility placement.  Essential hypertension: Resume home dose of beta-blocker, continue IV metoprolol every 6 hours.  Sacral decubitus ulcer stage III present on admission: RN Pressure Injury Documentation: Pressure Injury 07/18/17 Stage II -  Partial thickness loss of dermis presenting as a shallow open ulcer with a red, pink wound bed without slough. (Active)  07/18/17 0730  Location: Sacrum  Location Orientation:   Staging: Stage II -  Partial thickness loss of dermis presenting as a shallow open ulcer with a red, pink  wound bed without slough.  Wound Description (Comments):   Present on Admission: Yes     DVT prophylaxis: lovenox Family  Communication:none Status is: Inpatient  Remains inpatient appropriate because: Mechanical fall leading to a left hip fracture with superior and inferior pubic ramus fracture needing surgical intervention.     Code Status:     Code Status Orders  (From admission, onward)           Start     Ordered   04/26/21 1750  Do not attempt resuscitation (DNR)  Continuous       Question Answer Comment  In the event of cardiac or respiratory ARREST Do not call a code blue   In the event of cardiac or respiratory ARREST Do not perform Intubation, CPR, defibrillation or ACLS   In the event of cardiac or respiratory ARREST Use medication by any route, position, wound care, and other measures to relive pain and suffering. May use oxygen, suction and manual treatment of airway obstruction as needed for comfort.      04/26/21 1749           Code Status History     Date Active Date Inactive Code Status Order ID Comments User Context   02/27/2021 1447 03/05/2021 1822 DNR [id number]  Orma Flaming, MD ED   11/22/2020 1906 11/27/2020 1711 DNR [id number]  Orma Flaming, MD ED   07/11/2017 0002 07/20/2017 2327 Full Code [id number]  Norval Morton, MD ED   09/14/2014 1337 09/15/2014 1546 Full Code [id number]  Leonie Man, MD Inpatient   09/12/2014 0849 09/14/2014 1337 Full Code [id number]  [name], NP Inpatient         IV Access:   Peripheral IV   Procedures and diagnostic studies:   DG Chest 1 View  Result Date: 04/26/2021 CLINICAL DATA:  [place] out of chair, left hip fracture, non-small cell right lung cancer EXAM: CHEST  1 VIEW COMPARISON:  02/26/2021, 10/28/2020 FINDINGS: Single frontal view of the chest demonstrates a stable cardiac silhouette. Stable right apical mass consistent with biopsy-proven non-small cell lung cancer. Chronic areas of scarring are seen elsewhere within the mid upper lung zones. No new airspace disease, effusion, or pneumothorax. No acute bony  abnormalities. IMPRESSION: 1. Stable right apical mass consistent with known biopsy-proven non-small cell lung cancer. 2. No acute intrathoracic trauma. Electronically Signed   By: Randa Ngo M.D.   On: 04/26/2021 15:24   CT Head Wo Contrast  Result Date: 04/26/2021 CLINICAL DATA:  Neck trauma (Age >= 65y); Head trauma, minor (Age >= 65y) EXAM: CT HEAD WITHOUT CONTRAST CT CERVICAL SPINE WITHOUT CONTRAST TECHNIQUE: Multidetector CT imaging of the head and cervical spine was performed following the standard protocol without intravenous contrast. Multiplanar CT image reconstructions of the cervical spine were also generated. COMPARISON:  CT head February 26, 2021. CT cervical spine November 22, 2020. FINDINGS: CT HEAD FINDINGS Brain: No evidence of acute large vascular infarction, hemorrhage, hydrocephalus, extra-axial collection or mass lesion/mass effect. Similar patchy white matter hypoattenuation, nonspecific but compatible with chronic microvascular ischemic disease. Vascular: No hyperdense vessel identified. Calcific intracranial atherosclerosis. Skull: No acute fracture. Sinuses/Orbits: Opacified left anterior ethmoid air cell. Otherwise, largely clear sinuses. Unremarkable orbits. Other: No mastoid effusions. CT CERVICAL SPINE FINDINGS Motion limited study.  Within this limitation: Alignment: Similar alignment to the prior. Similar anterolisthesis of C6 on C7 with similar focal kyphosis at this level. Of the lower cervical spine dextrocurvature Skull base and vertebrae: Redemonstrated chronic erosions  along the base of the odontoid with posterior pannus. There is also severe erosive change along the right atlantoaxial joint, similar. Cystic degenerative change of the dens, similar. C3-C5 ACDF with corpectomy at C4. No evidence of hardware fracture. Similar alignment. Fused facet joints at C3-C4 and C6-C7. New T2 (20%) and T3 (30%) vertebral body height loss with associated superior endplate sclerosis and  lucency through the T3 superior endplate. Findings are partially imaged. Soft tissues and spinal canal: Motion limited evaluation without definite prevertebral edema or visible canal hematoma. Disc levels: Severe multilevel degenerative change. Craniocervical degenerative changes described above. Multilevel bilateral foraminal stenosis. Upper chest: Partially imaged nodular opacities in the posterior right upper lobe, reportedly biopsy-proven non-small cell lung cancer IMPRESSION: CT cervical spine: 1. Acute T2 and T3 compression fractures, described above and partially imaged. Recommend dedicated CT of the thoracic spine to further characterize and to fully evaluate the thoracic spine. 2. Similar postoperative changes of C3-C4 ACDF and C4 corpectomy with similar alignment. 3. Severe multilevel degenerative change, including severe craniocervical degenerative change and multilevel foraminal stenosis. MRI could further characterize the canal/cord/foramina if clinically indicated. 4. Partially imaged nodular opacities in the posterior right upper lobe, reportedly biopsy-proven non-small cell lung cancer CT head: No evidence of acute intracranial abnormality. Electronically Signed   By: Margaretha Sheffield M.D.   On: 04/26/2021 14:41   CT Cervical Spine Wo Contrast  Result Date: 04/26/2021 CLINICAL DATA:  Neck trauma (Age >= 65y); Head trauma, minor (Age >= 65y) EXAM: CT HEAD WITHOUT CONTRAST CT CERVICAL SPINE WITHOUT CONTRAST TECHNIQUE: Multidetector CT imaging of the head and cervical spine was performed following the standard protocol without intravenous contrast. Multiplanar CT image reconstructions of the cervical spine were also generated. COMPARISON:  CT head February 26, 2021. CT cervical spine November 22, 2020. FINDINGS: CT HEAD FINDINGS Brain: No evidence of acute large vascular infarction, hemorrhage, hydrocephalus, extra-axial collection or mass lesion/mass effect. Similar patchy white matter  hypoattenuation, nonspecific but compatible with chronic microvascular ischemic disease. Vascular: No hyperdense vessel identified. Calcific intracranial atherosclerosis. Skull: No acute fracture. Sinuses/Orbits: Opacified left anterior ethmoid air cell. Otherwise, largely clear sinuses. Unremarkable orbits. Other: No mastoid effusions. CT CERVICAL SPINE FINDINGS Motion limited study.  Within this limitation: Alignment: Similar alignment to the prior. Similar anterolisthesis of C6 on C7 with similar focal kyphosis at this level. Of the lower cervical spine dextrocurvature Skull base and vertebrae: Redemonstrated chronic erosions along the base of the odontoid with posterior pannus. There is also severe erosive change along the right atlantoaxial joint, similar. Cystic degenerative change of the dens, similar. C3-C5 ACDF with corpectomy at C4. No evidence of hardware fracture. Similar alignment. Fused facet joints at C3-C4 and C6-C7. New T2 (20%) and T3 (30%) vertebral body height loss with associated superior endplate sclerosis and lucency through the T3 superior endplate. Findings are partially imaged. Soft tissues and spinal canal: Motion limited evaluation without definite prevertebral edema or visible canal hematoma. Disc levels: Severe multilevel degenerative change. Craniocervical degenerative changes described above. Multilevel bilateral foraminal stenosis. Upper chest: Partially imaged nodular opacities in the posterior right upper lobe, reportedly biopsy-proven non-small cell lung cancer IMPRESSION: CT cervical spine: 1. Acute T2 and T3 compression fractures, described above and partially imaged. Recommend dedicated CT of the thoracic spine to further characterize and to fully evaluate the thoracic spine. 2. Similar postoperative changes of C3-C4 ACDF and C4 corpectomy with similar alignment. 3. Severe multilevel degenerative change, including severe craniocervical degenerative change and multilevel  foraminal stenosis.  MRI could further characterize the canal/cord/foramina if clinically indicated. 4. Partially imaged nodular opacities in the posterior right upper lobe, reportedly biopsy-proven non-small cell lung cancer CT head: No evidence of acute intracranial abnormality. Electronically Signed   By: Margaretha Sheffield M.D.   On: 04/26/2021 14:41   CT CHEST ABDOMEN PELVIS W CONTRAST  Result Date: 04/26/2021 CLINICAL DATA:  Dementia patient found on the floor after a fall. EXAM: CT CHEST, ABDOMEN, AND PELVIS WITH CONTRAST TECHNIQUE: Multidetector CT imaging of the chest, abdomen and pelvis was performed following the standard protocol during bolus administration of intravenous contrast. CONTRAST:  24mL OMNIPAQUE IOHEXOL 300 MG/ML  SOLN COMPARISON:  10/28/2020 FINDINGS: CT CHEST FINDINGS Cardiovascular: Art size upper limits of normal. No pericardial fluid. Coronary artery calcification and aortic atherosclerotic calcification as seen previously. Mediastinum/Nodes: No mediastinal or hilar mass or lymphadenopathy. Lungs/Pleura: Indistinct mass lesion at the right apex, apparently malignant by biopsy, appears slightly larger. This is difficult to measure precisely because of the morphology. Elsewhere, 6 mm subsolid nodule previously seen in the superior segment of the left lower lobe is unchanged, axial image 37. No progressive feature. No pleural effusion seen on either side. No evidence of infectious pneumonia or pulmonary collapse. Musculoskeletal: No rib fracture on the right. On the left, there are numerous subacute healing rib fractures. There are subacute but somewhat more recent appearing rib fractures posteriorly affecting the left seventh, eighth and ninth ribs. Old appearing compression deformity at T9. Minor superior endplate fracture at T3, not present in June of this year. Sternal fracture which appears subacute but not united, not present in June of this year. CT ABDOMEN PELVIS FINDINGS  Hepatobiliary: No liver parenchymal abnormality. Previous cholecystectomy. Chronic biliary ductal prominence. Pancreas: Negative Spleen: Normal Adrenals/Urinary Tract: Adrenal glands are normal. Multiple renal cysts on the left. Small nonobstructing renal calculi. Bladder is normal. Stomach/Bowel: Previous gastric surgery. Left inguinal hernia containing several loops of small intestine. No evidence of obstruction or incarceration. No acute colon finding. Vascular/Lymphatic: Aortic atherosclerosis. No aneurysm. IVC is normal. No retroperitoneal adenopathy. Reproductive: No pelvic mass. Other: No free fluid or air. Musculoskeletal: Distant discectomy and fusion procedure L4 to the sacrum. Likely nonunion at L5-S1. subacute fracture of the pubic rami on the right, not yet healed. Acute intertrochanteric fracture of the left femur. IMPRESSION: Slight enlargement of an indistinct irregular mass lesion at the right apex, reported to be malignant by prior biopsy. No change in a 6 mm subsolid nodule in the superior segment of the left lower lobe. Multiple subacute rib fractures on the left of varying ages, not yet healed. No sign of acute fracture today. Old compression deformity at T9. More recent superior endplate fracture at T3, not present in June of this year, but probably subacute. Subacute sternal fracture, not yet united. No evidence of acute abdominal organ injury. Aortic atherosclerosis. Left inguinal hernia containing small bowel but no evidence of obstruction or incarceration. Subacute pubic rami fractures on the right. Acute intertrochanteric femoral fracture on the left. 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 Electronically Signed   By: Nelson Chimes M.D.   On: 04/26/2021 16:36   CT T-SPINE NO CHARGE  Result Date: 04/26/2021 CLINICAL DATA:  Fall with back pain EXAM: CT THORACIC SPINE WITHOUT CONTRAST TECHNIQUE: Multidetector CT images of the thoracic were obtained using the standard protocol  without intravenous contrast. COMPARISON:  Cervical CT 11/22/2020.  CT chest 10/28/2020. FINDINGS: Alignment: Increased kyphotic curvature. Vertebrae: Old compression fracture at T9, not progressed since  June. Newly seen superior endplate fractures at T2, T3 and T4, likely subacute. Loss of height 20% or less at those levels. No acute thoracic region fracture identified. Paraspinal and other soft tissues: Negative Disc levels: No significant thoracic degenerative disease. No bony stenosis of the canal or foramina. IMPRESSION: No acute thoracic spine fracture suspected. Old fracture of T9. Likely subacute fractures at T2, T3 and T4, with only minimal loss of height. Electronically Signed   By: Nelson Chimes M.D.   On: 04/26/2021 16:38   CT L-SPINE NO CHARGE  Result Date: 04/26/2021 CLINICAL DATA:  [place].  Assess for lumbar compression fracture. EXAM: CT LUMBAR SPINE WITHOUT CONTRAST TECHNIQUE: Multidetector CT imaging of the lumbar spine was performed without intravenous contrast administration. Multiplanar CT image reconstructions were also generated. COMPARISON:  MRI 02/27/2021 FINDINGS: Segmentation: 5 lumbar type vertebral bodies. Alignment: Fixed anterolisthesis at L4-5 of 8 mm. Anterolisthesis at L5-S1 of 7 mm. Vertebrae: No lumbar region fracture. Likely solid union at the L4-5 level. Nonunion at L5-S1, with pronounced lucency surrounding the sacral screws and nitrogen gas in the L5-S1 disc level. Paraspinal and other soft tissues: Negative Disc levels: L1-2 and L2-3 are negative. L3-4 shows mild bulging of the disc and facet hypertrophy. See above regarding the surgical region from L4 to the sacrum. IMPRESSION: No acute traumatic finding in the lumbar region. Previous discectomy and fusion procedure from L4 to the sacrum. 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 Electronically Signed   By: Nelson Chimes M.D.   On: 04/26/2021 16:41    DG Knee Left Port  Result Date: 04/26/2021 CLINICAL DATA:  Un witnessed fall, left knee pain EXAM: PORTABLE LEFT KNEE - 1-2 VIEW COMPARISON:  02/06/2006 FINDINGS: Frontal and lateral views of the left knee demonstrate 3 component left knee arthroplasty in the expected position without signs of acute complication. There are no acute displaced fractures. Small joint effusion. Diffuse soft tissue edema. IMPRESSION: 1. No acute displaced fracture. 2. Diffuse soft tissue edema and trace left knee effusion. 3. Unremarkable 3 component left knee arthroplasty. Electronically Signed   By: Randa Ngo M.D.   On: 04/26/2021 17:05   DG Hip Unilat With Pelvis 2-3 Views Left  Result Date: 04/26/2021 CLINICAL DATA:  [place], left hip deformity EXAM: DG HIP (WITH OR WITHOUT PELVIS) 2-3V LEFT COMPARISON:  02/27/2021 FINDINGS: Frontal view of the pelvis as well as frontal and cross-table lateral views of the left hip are obtained. There is a displaced intertrochanteric left hip fracture, with mild impaction and varus angulation. No dislocation. There are fractures through the right superior and inferior pubic rami which are new since prior study. There is some sclerosis along the margins of these fractures and possible periosteal reaction, which could suggest subacute injury. Fracture lines are still readily apparent [facility] for. The bones are diffusely osteopenic. Sacroiliac joints are unremarkable. Stable postsurgical changes at the lumbosacral junction. IMPRESSION: 1. Acute intertrochanteric left hip fracture, with mild impaction and varus angulation. 2. Acute to subacute fractures through the right superior and inferior pubic rami. 3. Diffuse osteopenia. Electronically Signed   By: Randa Ngo M.D.   On: 04/26/2021 15:21     Medical Consultants:   None.   Subjective:    [name] moaning and groaning complaining of pain.  Objective:    Vitals:   04/27/21 0130 04/27/21 0200 04/27/21 0527 04/27/21  0630  BP: (!) 111/53 Marland Kitchen)  121/54 110/80 129/61  Pulse: 78 81 88 81  Resp: 18 17 18 17   Temp:      TempSrc:      SpO2: 98% 97% 98% 98%   SpO2: 98 %  No intake or output data in the 24 hours ending 04/27/21 0735 There were no vitals filed for this visit.  Exam: General exam: In no acute distress. Respiratory system: Good air movement and clear to auscultation. Cardiovascular system: S1 & S2 heard, RRR. No JVD. Gastrointestinal system: Abdomen is nondistended, soft and nontender.  Extremities: No pedal edema. Skin: No rashes, lesions or ulcers Psychiatry: No insight into medical condition.   Data Reviewed:    Labs: Basic Metabolic Panel: Recent Labs  Lab 04/26/21 1319 04/27/21 0323  NA 142 138  K 4.3 4.6  CL 113* 111  CO2 23 22  GLUCOSE 120* 131*  BUN 40* 29*  CREATININE 1.23* 1.01*  CALCIUM 8.9 8.5*  MG 2.2 1.9  PHOS  --  3.1   GFR CrCl cannot be calculated (Unknown ideal weight.). Liver Function Tests: Recent Labs  Lab 04/26/21 1319 04/27/21 0323  AST 22  --   ALT 19  --   ALKPHOS 168*  --   BILITOT 0.5  --   PROT 6.6  --   ALBUMIN 3.0* 2.4*   No results for input(s): LIPASE, AMYLASE in the last 168 hours. No results for input(s): AMMONIA in the last 168 hours. Coagulation profile Recent Labs  Lab 04/27/21 0323  INR 1.1   COVID-19 Labs  Recent Labs    04/27/21 0323  FERRITIN 236    Lab Results  Component Value Date   SARSCOV2NAA NEGATIVE 04/26/2021   SARSCOV2NAA NEGATIVE 03/04/2021   SARSCOV2NAA NEGATIVE 02/27/2021   [place] NEGATIVE 11/26/2020    CBC: Recent Labs  Lab 04/26/21 1319 04/27/21 0433  WBC 8.3 5.5  NEUTROABS 6.3  --   HGB 9.3* 8.2*  HCT 32.1* 27.0*  MCV 99.1 97.5  PLT 264 203   Cardiac Enzymes: Recent Labs  Lab 04/26/21 1319 04/27/21 0323  CKTOTAL 110 140   BNP (last 3 results) No results for input(s): PROBNP in the last 8760 hours. CBG: Recent Labs  Lab 04/26/21 1311  GLUCAP 98   D-Dimer: No  results for input(s): DDIMER in the last 72 hours. Hgb A1c: No results for input(s): HGBA1C in the last 72 hours. Lipid Profile: No results for input(s): CHOL, HDL, LDLCALC, TRIG, CHOLHDL, LDLDIRECT in the last 72 hours. Thyroid function studies: No results for input(s): TSH, T4TOTAL, T3FREE, THYROIDAB in the last 72 hours.  Invalid input(s): FREET3 Anemia work up: Recent Labs    04/27/21 0323  VITAMINB12 325  FOLATE 23.7  FERRITIN 236  TIBC 246*  IRON 34  RETICCTPCT 1.9   Sepsis Labs: Recent Labs  Lab 04/26/21 1300 04/26/21 1319 04/27/21 0433  WBC  --  8.3 5.5  LATICACIDVEN 1.0  --   --    Microbiology Recent Results (from the past 240 hour(s))  Blood culture (routine x 2)     Status: None (Preliminary result)   Collection Time: 04/26/21  1:19 PM   Specimen: BLOOD  Result Value Ref Range Status   Specimen Description BLOOD RIGHT ANTECUBITAL  Final   Special Requests   Final    BOTTLES DRAWN AEROBIC AND ANAEROBIC Blood Culture results may not be optimal due to an excessive volume of blood received in culture bottles   Culture  Setup Time   Final  GRAM POSITIVE RODS ANAEROBIC BOTTLE ONLY CRITICAL RESULT CALLED TO, READ BACK BY AND VERIFIED WITH: V BRYK,[email or web address]@[id number]  04/17/21 mk Performed at [facility], [place] [address], [place], [place] [address]    Culture PENDING  Incomplete   Report Status PENDING  Incomplete  Resp Panel by RT-PCR (Flu A&B, Covid) Nasopharyngeal Swab     Status: None   Collection Time: 04/26/21  1:34 PM   Specimen: Nasopharyngeal Swab; Nasopharyngeal(NP) swabs in vial transport medium  Result Value Ref Range Status   SARS Coronavirus 2 by RT PCR NEGATIVE NEGATIVE Final    Comment: (NOTE) SARS-CoV-2 target nucleic acids are NOT DETECTED.  The SARS-CoV-2 RNA is generally detectable in upper respiratory specimens during the acute phase of infection. The lowest concentration of SARS-CoV-2 viral copies this assay can detect is 138  copies/mL. A negative result does not preclude SARS-Cov-2 infection and should not be used as the sole basis for treatment or other patient management decisions. A negative result may occur with  improper specimen collection/handling, submission of specimen other than nasopharyngeal swab, presence of viral mutation(s) within the areas targeted by this assay, and inadequate number of viral copies(<138 copies/mL). A negative result must be combined with clinical observations, patient history, and epidemiological information. The expected result is Negative.  Fact Sheet for Patients:  EntrepreneurPulse.com.au  Fact Sheet for Healthcare Providers:  IncredibleEmployment.be  This test is no t yet approved or cleared by the [place] FDA and  has been authorized for detection and/or diagnosis of SARS-CoV-2 by FDA under an Emergency Use Authorization (EUA). This EUA will remain  in effect (meaning this test can be used) for the duration of the COVID-19 declaration under Section 564(b)(1) of the Act, 21 U.S.C.section 360bbb-3(b)(1), unless the authorization is terminated  or revoked sooner.       Influenza A by PCR NEGATIVE NEGATIVE Final   Influenza B by PCR NEGATIVE NEGATIVE Final    Comment: (NOTE) The Xpert Xpress SARS-CoV-2/FLU/RSV plus assay is intended as an aid in the diagnosis of influenza from Nasopharyngeal swab specimens and should not be used as a sole basis for treatment. Nasal washings and aspirates are unacceptable for Xpert Xpress SARS-CoV-2/FLU/RSV testing.  Fact Sheet for Patients: EntrepreneurPulse.com.au  Fact Sheet for Healthcare Providers: IncredibleEmployment.be  This test is not yet approved or cleared by the [place] FDA and has been authorized for detection and/or diagnosis of SARS-CoV-2 by FDA under an Emergency Use Authorization (EUA). This EUA will remain in effect (meaning  this test can be used) for the duration of the COVID-19 declaration under Section 564(b)(1) of the Act, 21 U.S.C. section 360bbb-3(b)(1), unless the authorization is terminated or revoked.  Performed at [facility], [place] [address], [place], [place] [address]      Medications:    busPIRone  5 mg Oral BID   cholecalciferol  1,000 Units Oral Daily   donepezil  10 mg Oral QHS   gabapentin  100 mg Oral BID   memantine  5 mg Oral Daily   metoprolol tartrate  2.5 mg Intravenous Q6H   mometasone-formoterol  2 puff Inhalation BID   multivitamin with minerals  1 tablet Oral Daily   pantoprazole  40 mg Oral BID   QUEtiapine  50 mg Oral QHS   Continuous Infusions:  dextrose 5% lactated ringers 50 mL/hr at 04/26/21 2148   methocarbamol (ROBAXIN) IV        LOS: 1 day   Charlynne Cousins  [facility]  04/27/2021, 7:35 AM

## 2021-04-27 NOTE — TOC CAGE-AID Note (Signed)
Transition of Care [facility]) - CAGE-AID Screening   Patient Details  Name: Sara Fox MRN: [id number] Date of Birth: 07-30-1940  [facility]) CM/SW Contact:    Bethann Berkshire, [place] Phone Number: 04/27/2021, 1:08 PM   Clinical Narrative:  Pt not appropriate for CAGE-AID screen due to dementia.   CAGE-AID Screening: Substance Abuse Screening unable to be completed due to: : Patient unable to participate

## 2021-04-27 NOTE — Interval H&P Note (Signed)
History and Physical Interval Note:  04/27/2021 12:40 PM  Sara Fox  has presented today for surgery, with the diagnosis of left intertrochantric fracture.  The various methods of treatment have been discussed with the patient and family. After consideration of risks, benefits and other options for treatment, the patient has consented to  Procedure(s): INTRAMEDULLARY (IM) NAIL INTERTROCHANTRIC (Left) as a surgical intervention.  The patient's history has been reviewed, patient examined, no change in status, stable for surgery.  I have reviewed the patient's chart and labs.  Questions were answered to the patient's satisfaction.     Lennette Bihari P Maryam Feely

## 2021-04-28 ENCOUNTER — Encounter (HOSPITAL_COMMUNITY): Payer: Self-pay | Admitting: Student

## 2021-04-28 DIAGNOSIS — Z419 Encounter for procedure for purposes other than remedying health state, unspecified: Secondary | ICD-10-CM

## 2021-04-28 DIAGNOSIS — F0280 Dementia in other diseases classified elsewhere without behavioral disturbance: Secondary | ICD-10-CM

## 2021-04-28 DIAGNOSIS — G3183 Dementia with Lewy bodies: Secondary | ICD-10-CM

## 2021-04-28 LAB — CBC
HCT: 27.5 % — ABNORMAL LOW (ref 36.0–46.0)
Hemoglobin: 8.1 g/dL — ABNORMAL LOW (ref 12.0–15.0)
MCH: 28.7 pg (ref 26.0–34.0)
MCHC: 29.5 g/dL — ABNORMAL LOW (ref 30.0–36.0)
MCV: 97.5 fL (ref 80.0–100.0)
Platelets: 190 10*3/uL (ref 150–400)
RBC: 2.82 MIL/uL — ABNORMAL LOW (ref 3.87–5.11)
RDW: 14.6 % (ref 11.5–15.5)
WBC: 6.4 10*3/uL (ref 4.0–10.5)
nRBC: 0 % (ref 0.0–0.2)

## 2021-04-28 LAB — BASIC METABOLIC PANEL
Anion gap: 8 (ref 5–15)
BUN: 25 mg/dL — ABNORMAL HIGH (ref 8–23)
CO2: 20 mmol/L — ABNORMAL LOW (ref 22–32)
Calcium: 8.3 mg/dL — ABNORMAL LOW (ref 8.9–10.3)
Chloride: 109 mmol/L (ref 98–111)
Creatinine, Ser: 1.15 mg/dL — ABNORMAL HIGH (ref 0.44–1.00)
GFR, Estimated: 48 mL/min — ABNORMAL LOW (ref >60)
Glucose, Bld: 129 mg/dL — ABNORMAL HIGH (ref 70–99)
Potassium: 4.5 mmol/L (ref 3.5–5.1)
Sodium: 137 mmol/L (ref 135–145)

## 2021-04-28 LAB — CK: Total CK: 137 U/L (ref 38–234)

## 2021-04-28 MED ORDER — SODIUM CHLORIDE 0.9 % IV SOLN: INTRAVENOUS | Status: AC

## 2021-04-28 NOTE — Evaluation (Signed)
Physical Therapy Evaluation Patient Details Name: Sara Fox MRN: [id number] DOB: November 19, 1940 Today's Date: 04/28/2021  History of Present Illness  Pt is an 80 y/o F presenting from home after unwitnessed fall 04/26/21, found to have L hip fracture, s/p IM nail on 12/14 WBAT post op. PMH includes dementia, COPD, CHF, CKD, stage IV lung cancer s/p SBRT.  Clinical Impression  Pt was heavy assist to get EOB with OT, so PT utilized lift equipment for much needed OOB mobility.  Pt was generally total assist for bed mobility and to perform L hip exercises, but she did arouse more when up OOB with all lights turned on.  Given her baseline dementia, delerium precautions should be done (lights on, blinds up during the day).  She will need SNF level rehab at discharge.   PT to follow acutely for deficits listed below.  The only way she could go home would be with 24/7 total care, ambulance transport and the below listed equipment.      Recommendations for follow up therapy are one component of a multi-disciplinary discharge planning process, led by the attending physician.  Recommendations may be updated based on patient status, additional functional criteria and insurance authorization.  Follow Up Recommendations Skilled nursing-short term rehab (<3 hours/day)    Assistance Recommended at Discharge    Functional Status Assessment Patient has had a recent decline in their functional status and demonstrates the ability to make significant improvements in function in a reasonable and predictable amount of time.  Equipment Recommendations  Wheelchair (measurements PT);Wheelchair cushion (measurements PT);Hospital bed;Other (comment) (hoyer lift)    Recommendations for Other Services       Precautions / Restrictions Precautions Precautions: Fall Restrictions Weight Bearing Restrictions: Yes LLE Weight Bearing: Weight bearing as tolerated      Mobility  Bed Mobility Overal bed mobility: Needs  Assistance Bed Mobility: Rolling Rolling: Total assist         General bed mobility comments: total assist to roll bil for lift pad placement.    Transfers Overall transfer level: Needs assistance Equipment used: Ambulation equipment used Transfers: Bed to chair/wheelchair/BSC             General transfer comment: Maxi moved used for OOB to chair transfer secondary to inability to sit EOB with OT assist earlier.  Pt needs OOB mobility to be in better position to eat lunch, for PNA prevention, and to increase her daytime arousal (lights also turned on as she was in the dark and with dementia this could easily make her more confused and flip her day/night). Transfer via Lift Equipment: [employer]  Ambulation/Gait                  Stairs            Wheelchair Mobility    Modified Rankin (Stroke Patients Only)       Balance Overall balance assessment: Needs assistance Sitting-balance support: Bilateral upper extremity supported;Feet supported Sitting balance-Leahy Scale: Poor Sitting balance - Comments: leans to R side in long sitting, unable to maintian trunk in neutral alignment                                     Pertinent Vitals/Pain Pain Assessment: Faces Pain Score: 4  Faces Pain Scale: Hurts even more Pain Location: LLE Pain Descriptors / Indicators: Aching;Constant;Grimacing;Guarding;Discomfort Pain Intervention(s): Monitored during session;Limited activity within patient's tolerance;Repositioned  Home Living Family/patient expects to be discharged to:: Private residence Living Arrangements: Children;Spouse/significant other Available Help at Discharge: Family;Available 24 hours/day Type of Home: House Home Access: Stairs to enter;Level entry Entrance Stairs-Rails: None Entrance Stairs-Number of Steps: 3   Home Layout: One level Home Equipment: Conservation officer, nature (2 wheels) Additional Comments: per chart review, pt has RW for  mobility at baseline but does not use    Prior Function Prior Level of Function : Patient poor historian/Family not available                     Hand Dominance        Extremity/Trunk Assessment   Upper Extremity Assessment Upper Extremity Assessment: Defer to OT evaluation    Lower Extremity Assessment Lower Extremity Assessment: LLE deficits/detail LLE Deficits / Details: left leg with normal post op pain and weakness, ankle 2-/5, knee 2-/5, hip 2-/5, limited by pain and cognition LLE: Unable to fully assess due to pain       Communication   Communication: HOH  Cognition Arousal/Alertness: Lethargic Behavior During Therapy: Flat affect Overall Cognitive Status: History of cognitive impairments - at baseline                                 General Comments: pt has baseline dementia, follows single step commands inconsistently with increased time and repetition        General Comments      Exercises Total Joint Exercises Ankle Circles/Pumps: PROM;Both;20 reps Heel Slides: PROM;Left;10 reps Hip ABduction/ADduction: PROM;Left;10 reps Long Arc Quad: PROM;Left;10 reps;Seated   Assessment/Plan    PT Assessment Patient needs continued PT services  PT Problem List Decreased strength;Decreased range of motion;Decreased activity tolerance;Decreased balance;Decreased mobility;Decreased cognition;Decreased knowledge of use of DME;Decreased safety awareness;Pain       PT Treatment Interventions DME instruction;Gait training;Stair training;Functional mobility training;Therapeutic activities;Therapeutic exercise;Balance training;Cognitive remediation;Patient/family education;Manual techniques;Modalities    PT Goals (Current goals can be found in the Care Plan section)  Acute Rehab PT Goals PT Goal Formulation: Patient unable to participate in goal setting Time For Goal Achievement: 05/12/21 Potential to Achieve Goals: Good    Frequency Min 3X/week    Barriers to discharge        Co-evaluation               AM-PAC PT "6 Clicks" Mobility  Outcome Measure Help needed turning from your back to your side while in a flat bed without using bedrails?: Total Help needed moving from lying on your back to sitting on the side of a flat bed without using bedrails?: Total Help needed moving to and from a bed to a chair (including a wheelchair)?: Total Help needed standing up from a chair using your arms (e.g., wheelchair or bedside chair)?: Total Help needed to walk in hospital room?: Total Help needed climbing 3-5 steps with a railing? : Total 6 Click Score: 6    End of Session   Activity Tolerance: Patient limited by pain Patient left: in chair;with call bell/phone within reach;with chair alarm set   PT Visit Diagnosis: Muscle weakness (generalized) (M62.81);Difficulty in walking, not elsewhere classified (R26.2);Pain Pain - Right/Left: Left Pain - part of body: Leg    Time: 7939-0300 PT Time Calculation (min) (ACUTE ONLY): 32 min   Charges:   PT Evaluation $PT Eval Moderate Complexity: 1 Mod PT Treatments $Therapeutic Activity: 8-22 mins  Verdene Lennert, PT, DPT  Acute Rehabilitation Ortho Tech Supervisor [phone] pager [phone] office

## 2021-04-28 NOTE — Evaluation (Signed)
Occupational Therapy Evaluation Patient Details Name: Sara Fox MRN: [id number] DOB: November 15, 1940 Today's Date: 04/28/2021   History of Present Illness Pt is an 80 y/o F presenting from home after unwitnessed fall, found to have L hip fracture, s/p IM nail on 12/14. PMH includes dementia, COPD, CHF, CKD, stage IV lung cancer s/p SBRT.   Clinical Impression   Per chart review, pt lives with son at baseline, unclear level of assistance needed with ADLs, however uses RW for mobility. At this time, pt currently requiring mod-total A for ADLs and mod A for bed mobility. Pt very drowsy, follows single step commands inconsistently with increased time. Deferred tranfers and OOB mobility due to decreased cognition and lethergy at this time. Pt presenting with impairments listed below, will continue to follow acutely to address safety and functional mobility with ADLs. Recommend SNF at d/c.     Recommendations for follow up therapy are one component of a multi-disciplinary discharge planning process, led by the attending physician.  Recommendations may be updated based on patient status, additional functional criteria and insurance authorization.   Follow Up Recommendations  Skilled nursing-short term rehab (<3 hours/day)    Assistance Recommended at Discharge Frequent or constant Supervision/Assistance  Functional Status Assessment  Patient has had a recent decline in their functional status and demonstrates the ability to make significant improvements in function in a reasonable and predictable amount of time.  Equipment Recommendations  Other (comment);None recommended by OT (defer to next venue of care)    Recommendations for Other Services PT consult     Precautions / Restrictions Precautions Precautions: Fall Restrictions Weight Bearing Restrictions: Yes LLE Weight Bearing: Weight bearing as tolerated      Mobility Bed Mobility Overal bed mobility: Needs Assistance Bed Mobility:  Rolling Rolling: Mod assist         General bed mobility comments: able to roll R/L when asked for pad change, pt uses bedrail for stability    Transfers Overall transfer level: Needs assistance                 General transfer comment: deferred due to pt's inability to sit EOB/drowsiness/inconsistently following commands      Balance Overall balance assessment: Needs assistance Sitting-balance support: Bilateral upper extremity supported;Feet supported Sitting balance-Leahy Scale: Poor Sitting balance - Comments: leans to R side in long sitting, unable to maintian trunk in neutral alignment                                   ADL either performed or assessed with clinical judgement   ADL Overall ADL's : Needs assistance/impaired Eating/Feeding: Minimal assistance;Sitting   Grooming: Moderate assistance;Bed level   Upper Body Bathing: Bed level;Maximal assistance   Lower Body Bathing: Bed level;Maximal assistance   Upper Body Dressing : Maximal assistance;Bed level Upper Body Dressing Details (indicate cue type and reason): changed gown, pt able to inconsistently follow directions/keep eyes open to don new gown Lower Body Dressing: Maximal assistance;Bed level   Toilet Transfer: Total assistance;+2 for physical assistance;BSC/3in1   Toileting- Clothing Manipulation and Hygiene: Maximal assistance;Total assistance;Bed level       Functional mobility during ADLs: Maximal assistance       Vision   Vision Assessment?: No apparent visual deficits     Perception     Praxis      Pertinent Vitals/Pain Pain Assessment: Faces Pain Score: 4  Faces Pain Scale: Hurts  little more Pain Location: LLE Pain Descriptors / Indicators: Aching;Constant;Grimacing;Guarding;Discomfort Pain Intervention(s): Limited activity within patient's tolerance;Monitored during session;Premedicated before session     Hand Dominance     Extremity/Trunk Assessment  Upper Extremity Assessment Upper Extremity Assessment: Generalized weakness;Difficult to assess due to impaired cognition   Lower Extremity Assessment Lower Extremity Assessment: Defer to PT evaluation       Communication Communication Communication: HOH;Expressive difficulties   Cognition Arousal/Alertness: Lethargic Behavior During Therapy: Flat affect Overall Cognitive Status: History of cognitive impairments - at baseline                                 General Comments: pt has baseline dementia, follows single step commands inconsistently with increased time and repetition     General Comments       Exercises     Shoulder Instructions      Home Living Family/patient expects to be discharged to:: Private residence Living Arrangements: Children;Spouse/significant other Available Help at Discharge: Family;Available 24 hours/day Type of Home: House Home Access: Stairs to enter;Level entry Entrance Stairs-Number of Steps: 3 Entrance Stairs-Rails: None Home Layout: One level     Bathroom Shower/Tub: Teacher, early years/pre: Standard     Home Equipment: Conservation officer, nature (2 wheels)   Additional Comments: per chart review, pt has RW for mobility at baseline but does not use      Prior Functioning/Environment Prior Level of Function : Patient poor historian/Family not available                        OT Problem List: Decreased strength;Decreased range of motion;Decreased activity tolerance;Impaired balance (sitting and/or standing);Decreased cognition;Decreased safety awareness;Decreased knowledge of use of DME or AE;Decreased knowledge of precautions;Pain      OT Treatment/Interventions: Self-care/ADL training;Therapeutic exercise;Cognitive remediation/compensation;Therapeutic activities;DME and/or AE instruction;Balance training;Patient/family education    OT Goals(Current goals can be found in the care plan section) Acute Rehab OT  Goals Patient Stated Goal: none stated OT Goal Formulation: Patient unable to participate in goal setting Time For Goal Achievement: 05/12/21 Potential to Achieve Goals: Fair ADL Goals Pt Will Perform Upper Body Dressing: with min assist;sitting Pt Will Transfer to Toilet: with mod assist;bedside commode;stand pivot transfer Pt Will Perform Tub/Shower Transfer: rolling walker;ambulating;shower seat;Shower transfer;with mod assist  OT Frequency: Min 2X/week   Barriers to D/C:            Co-evaluation              AM-PAC OT "6 Clicks" Daily Activity     Outcome Measure Help from another person eating meals?: A Little Help from another person taking care of personal grooming?: A Little Help from another person toileting, which includes using toliet, bedpan, or urinal?: Total Help from another person bathing (including washing, rinsing, drying)?: Total Help from another person to put on and taking off regular upper body clothing?: A Lot Help from another person to put on and taking off regular lower body clothing?: Total 6 Click Score: 11   End of Session Nurse Communication: Mobility status  Activity Tolerance: Patient limited by lethargy;Patient limited by fatigue Patient left: in bed;with call bell/phone within reach;with bed alarm set  OT Visit Diagnosis: Unsteadiness on feet (R26.81);Other abnormalities of gait and mobility (R26.89);Muscle weakness (generalized) (M62.81);History of falling (Z91.81);Repeated falls (R29.6);Pain Pain - Right/Left: Left Pain - part of body: Leg  Time: 2091-9802 OT Time Calculation (min): 23 min Charges:  OT General Charges $OT Visit: 1 Visit OT Evaluation $OT Eval Low Complexity: 1 Low OT Treatments $Self Care/Home Management : 8-22 mins  Lynnda Child, OTD, OTR/L Acute Rehab [phone] - [place] 04/28/2021, 10:04 AM

## 2021-04-28 NOTE — Progress Notes (Signed)
Patient's son at bedside. Update provided. Patient's rings (5 rings) in a plastic bag with patient label returned to patient's son to be taken home.

## 2021-04-28 NOTE — Progress Notes (Signed)
Mobility Specialist Progress Note   04/28/21 1630  Mobility  Activity Transferred:  Chair to bed  Level of Assistance Total care  Assistive Device MaxiMove  LLE Weight Bearing WBAT  Mobility Response Tolerated well  Mobility performed by Mobility specialist  $Mobility charge 1 Mobility   Received pt in chair requesting to go back to bed and having no symptoms. Transferred w/ slight c/o pain in L hip. Before reaching bed pt had BM and was assisted w/ pericare. Left in bed w/ call bell by side and NT notified about further needing assistance.   Holland Falling Mobility Specialist Phone Number [phone]

## 2021-04-28 NOTE — Progress Notes (Signed)
Orthopaedic Trauma Progress Note  SUBJECTIVE: Patient resting comfortably this morning.  Reports mild pain about operative site. No chest pain. No SOB. No nausea/vomiting. No other complaints.   OBJECTIVE:  Vitals:   04/27/21 2046 04/28/21 0720  BP: (!) 126/97 128/72  Pulse: 74 65  Resp: 16 17  Temp: (!) 97.5 F (36.4 C) 97.6 F (36.4 C)  SpO2: 100% 100%    General: Laying in bed comfortably, no acute distress Respiratory: No increased work of breathing.  Left lower extremity: Mepilex dressings are clean, dry, intact.  No significant tenderness over the hip or throughout the thigh.  Endorses sensation to light touch that extremity.  Compartments soft and compressible. + DP pulse  IMAGING: Stable post op imaging.   LABS:  Results for orders placed or performed during the hospital encounter of 04/26/21 (from the past 24 hour(s))  Surgical pcr screen     Status: Abnormal   Collection Time: 04/27/21 12:33 PM   Specimen: Nasal Mucosa; Nasal Swab  Result Value Ref Range   MRSA, PCR POSITIVE (A) NEGATIVE   Staphylococcus aureus POSITIVE (A) NEGATIVE  CK     Status: None   Collection Time: 04/28/21  1:58 AM  Result Value Ref Range   Total CK 137 38 - 234 U/L  CBC     Status: Abnormal   Collection Time: 04/28/21  1:58 AM  Result Value Ref Range   WBC 6.4 4.0 - 10.5 K/uL   RBC 2.82 (L) 3.87 - 5.11 MIL/uL   Hemoglobin 8.1 (L) 12.0 - 15.0 g/dL   HCT 27.5 (L) 36.0 - 46.0 %   MCV 97.5 80.0 - 100.0 fL   MCH 28.7 26.0 - 34.0 pg   MCHC 29.5 (L) 30.0 - 36.0 g/dL   RDW 14.6 11.5 - 15.5 %   Platelets 190 150 - 400 K/uL   nRBC 0.0 0.0 - 0.2 %  Basic metabolic panel     Status: Abnormal   Collection Time: 04/28/21  1:58 AM  Result Value Ref Range   Sodium 137 135 - 145 mmol/L   Potassium 4.5 3.5 - 5.1 mmol/L   Chloride 109 98 - 111 mmol/L   CO2 20 (L) 22 - 32 mmol/L   Glucose, Bld 129 (H) 70 - 99 mg/dL   BUN 25 (H) 8 - 23 mg/dL   Creatinine, Ser 1.15 (H) 0.44 - 1.00 mg/dL   Calcium  8.3 (L) 8.9 - 10.3 mg/dL   GFR, Estimated 48 (L) >60 mL/min   Anion gap 8 5 - 15    ASSESSMENT: Sara Fox is a 80 y.o. female, 1 Day Post-Op s/p INTRAMEDULLARY NAIL LEFT INTERTROCHANTRIC FEMUR FRACTURE  CV/Blood loss: Hemoglobin 8.1 this morning, stable from pre-op on 04/27/2021.  Hemodynamically stable  PLAN: Weightbearing: WBAT LLE Range of motion: Okay for unrestricted hip and knee ROM Incisional and dressing care: Plan to remove dressings tomorrow incisions open to air Showering: Okay to begin showering with assistance 04/30/2021.  Incisions may get wet at this point Orthopedic device(s): None  Pain management:  1. Tylenol 1000 mg q 8 hours scheduled 2. Robaxin 500 mg q 6 hours PRN 3. Neurontin 100 mg BID VTE prophylaxis: Aspirin 81 mg, SCDs ID:  Ancef 2gm post op Foley/Lines:  No foley, KVO IVFs Dispo: PT/OT evaluation today, dispo pending.  Patient will likely require SNF at discharge.  We will plan to remove dressing from left hip tomorrow and leave open to air.  Okay for discharge from ortho  standpoint once cleared by medicine team and therapies Follow - up plan: 2 weeks after discharge for repeat x-rays and wound check  Contact information:  Katha Hamming MD, Patrecia Pace PA-C. After hours and holidays please check [email or web address] for group call information for [facility]   Sara Ow A. Ricci Barker, PA-C [phone] (office) [email or web address]

## 2021-04-28 NOTE — Progress Notes (Signed)
Daily Progress Note   Patient Name: Sara Fox       Date: 04/28/2021 DOB: [birth year]  Age: 80 y.o. MRN#: [id number] Attending Physician: Charlynne Cousins, MD Primary Care Physician: [facility] Admit Date: 04/26/2021    HPI/Patient Profile: 80 y.o. female  with past medical history of dementia, ambulatory dysfunction, combined systolic and diastolic heart failure, COPD, stage IIIa chronic kidney disease, and stage Iia NSCLC status post SBRT. She presented to the emergency department on 04/26/2021 with after an unwitnessed fall at home. CT of the head no evidence of acute intracranial abnormality, CT of the C-spine showed T2 and T3 compression fracture, left hip x-ray showed left acute intertrochanteric hip fracture with mild amputation, acute and subacute fracture throughout the superior and inferior pubic rami. Orthopedic surgery has been consulted and is recommending surgical intervention. Admitted to [facility].    Subjective: Chart reviewed. Patient is status post cephalomedullary nailing of left intertrochanteric femur fracture on 04/27/21. She was evaluated by PT/OT earlier today and they are recommending SNF/rehab. Note that patient required heavy/total assist for both PT and OT sessions.   Patient is OOB to recliner. States she is cold and asking for another blanket. No other complaints. Oriented to person and place.     Objective:  Physical Exam Vitals reviewed.  Constitutional:      General: She is not in acute distress.    Comments: Frail and chronically ill-appearing  Pulmonary:     Effort: Pulmonary effort is normal.  Neurological:     Mental Status: She is alert.     Comments: Oriented to person and place  Psychiatric:        Cognition and Memory:  Cognition is impaired.            Vital Signs: BP (!) 120/56 (BP Location: Left Arm)    Pulse 80    Temp 97.6 F (36.4 C) (Oral)    Resp 17    Ht 5\' 4"  (1.626 m)    Wt 59 kg    SpO2 100%    BMI 22.31 kg/m  SpO2: SpO2: 100 % O2 Device: O2 Device: Room Air   LBM: Last BM Date: 04/27/21 Baseline Weight: Weight: 59 kg Most recent weight: Weight: 59 kg       Palliative Assessment/Data: PPS 30-40%  Palliative Care Assessment & Plan   Assessment: - unwitnessed fall, resulting in closed left hip fracture - chronic multiple rib and vertebral compression fractures - history of lung cancer status post therapy - chronic combined systolic and diastolic heart failure - COPD, OSA - Lewy body dementia without behavioral disturbance - CKD stage IIIa - sacral decubitus stage 3  Recommendations/Plan: Continue current care Patient will need SNF/rehab  Son is hopeful for patient to return home after rehab Outpatient palliative referral PMT will continue to follow  Code Status: DNR/DNI  Prognosis:  Difficult to determine, but less than 6 months would not be surprising  Discharge Planning: [facility] for rehab with Palliative care service follow-up    Thank you for allowing the Palliative Medicine Team to assist in the care of this patient.   Total Time 15 minutes Prolonged Time Billed  no       Greater than 50%  of this time was spent counseling and coordinating care related to the above assessment and plan.  Lavena Bullion, NP  Please contact Palliative Medicine Team phone at [phone] for questions and concerns.

## 2021-04-28 NOTE — Progress Notes (Signed)
[facility] PROGRESS NOTE    Progress Note  Sara Fox  GLO:[id number] DOB: 1940/10/01 DOA: 04/26/2021 PCP: [facility]     Brief Narrative:   Sara Fox is an 80 y.o. female past medical history of Alzheimer's dementia, ambulatory dysfunction, combined systolic and diastolic heart failure, COPD, stage IIIa chronic kidney disease, stage IIA NSLC lsince 2021 who underwent curative SBRT  with a suspicious small lower right paratracheal lymph node, recently discharged from the hospital, found down by EMS by her son, he relates that she has been complaining of no symptoms.  In the ED her hemoglobin was 9 creatinine 1.3, SARS-CoV-2 and influenza were negative CT of the head no evidence of acute intracranial abnormality, CT of the C-spine showed T2 and T3 compression fracture, left hip x-ray showed left acute intertrochanteric hip fracture with mild amputation, acute and subacute fracture throughout the superior and inferior pubic rami.  Orthopedic surgery was consulted who recommended surgical intervention    Assessment/Plan:   Unwitnessed fall leading to closed left hip fracture, initial encounter [facility]): Orthopedic surgery was consulted who recommended ORIF on 04/27/2021. Patient has only required Tylenol for pain discontinue narcotics, continue Robaxin.  We will give her single dose of MiraLAX. Allow a diet.  Preop evaluation: Lyndel Safe calculator score 2.3% risk of myocardial infarction or cardiac arrest injury intraoperatively and up to 30 days postop. Continue current dose of beta-blockers  Chronic multiple rib and vertebral compression fractures: With a new subacute T2 and T4 compression fracture. Continue narcotics for pain. There is likely due to multiple falls. Physical therapy evaluated the patient will need SNF  History of lung cancer status post therapy: Need outpatient follow-up with oncology.  Chronic combined systolic and diastolic heart  failure: 2D echo in August 2022 showed an EF of 55% no wall motion abnormalities. Holding Lasix, on IV fluids as she is NPO.  Chronic COPD/obstructive sleep apnea: Continue inhalers.  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  Chronic kidney disease stage IIIa: Creatinine appears to be at baseline.  Normocytic anemia: Hemoglobin has remained relatively stable, her hemoglobin is 8.1  Ethics/goals of care: Had a long discussion with the son who is the next of kin has been taking care of her for the last 10 years she currently lives with him, about the risk and benefits of proceeding with surgical intervention and he would like to proceed.    Essential hypertension: Resume home dose of beta-blocker, continue IV metoprolol every 6 hours.  Sacral decubitus ulcer stage III present on admission: RN Pressure Injury Documentation: Pressure Injury 07/18/17 Stage II -  Partial thickness loss of dermis presenting as a shallow open ulcer with a red, pink wound bed without slough. (Active)  07/18/17 0730  Location: Sacrum  Location Orientation:   Staging: Stage II -  Partial thickness loss of dermis presenting as a shallow open ulcer with a red, pink wound bed without slough.  Wound Description (Comments):   Present on Admission: Yes     DVT prophylaxis: lovenox Family Communication:none Status is: Inpatient  Remains inpatient appropriate because: Mechanical fall leading to a left hip fracture with superior and inferior pubic ramus fracture needing surgical intervention.     Code Status:     Code Status Orders  (From admission, onward)           Start     Ordered   04/26/21 1750  Do not attempt resuscitation (DNR)  Continuous       Question Answer Comment  In the event of cardiac or respiratory ARREST Do not call a code blue   In the event of cardiac or respiratory  ARREST Do not perform Intubation, CPR, defibrillation or ACLS   In the event of cardiac or respiratory ARREST Use medication by any route, position, wound care, and other measures to relive pain and suffering. May use oxygen, suction and manual treatment of airway obstruction as needed for comfort.      04/26/21 1749           Code Status History     Date Active Date Inactive Code Status Order ID Comments User Context   02/27/2021 1447 03/05/2021 1822 DNR [id number]  Orma Flaming, MD ED   11/22/2020 1906 11/27/2020 1711 DNR [id number]  Orma Flaming, MD ED   07/11/2017 0002 07/20/2017 2327 Full Code [id number]  Norval Morton, MD ED   09/14/2014 1337 09/15/2014 1546 Full Code [id number]  Leonie Man, MD Inpatient   09/12/2014 0849 09/14/2014 1337 Full Code [id number]  [name], NP Inpatient         IV Access:   Peripheral IV   Procedures and diagnostic studies:   DG Chest 1 View  Result Date: 04/26/2021 CLINICAL DATA:  [place] out of chair, left hip fracture, non-small cell right lung cancer EXAM: CHEST  1 VIEW COMPARISON:  02/26/2021, 10/28/2020 FINDINGS: Single frontal view of the chest demonstrates a stable cardiac silhouette. Stable right apical mass consistent with biopsy-proven non-small cell lung cancer. Chronic areas of scarring are seen elsewhere within the mid upper lung zones. No new airspace disease, effusion, or pneumothorax. No acute bony abnormalities. IMPRESSION: 1. Stable right apical mass consistent with known biopsy-proven non-small cell lung cancer. 2. No acute intrathoracic trauma. Electronically Signed   By: Randa Ngo M.D.   On: 04/26/2021 15:24   CT Head Wo Contrast  Result Date: 04/26/2021 CLINICAL DATA:  Neck trauma (Age >= 65y); Head trauma, minor (Age >= 65y) EXAM: CT HEAD WITHOUT CONTRAST CT CERVICAL SPINE WITHOUT CONTRAST TECHNIQUE: Multidetector CT imaging of the head and cervical spine was performed following the standard protocol without  intravenous contrast. Multiplanar CT image reconstructions of the cervical spine were also generated. COMPARISON:  CT head February 26, 2021. CT cervical spine November 22, 2020. FINDINGS: CT HEAD FINDINGS Brain: No evidence of acute large vascular infarction, hemorrhage, hydrocephalus, extra-axial collection or mass lesion/mass effect. Similar patchy white matter hypoattenuation, nonspecific but compatible with chronic microvascular ischemic disease. Vascular: No hyperdense vessel identified. Calcific intracranial atherosclerosis. Skull: No acute fracture. Sinuses/Orbits: Opacified left anterior ethmoid air cell. Otherwise, largely clear sinuses. Unremarkable orbits. Other: No mastoid effusions. CT CERVICAL SPINE FINDINGS Motion limited study.  Within this limitation: Alignment: Similar alignment to the prior. Similar anterolisthesis of C6 on C7 with similar focal kyphosis at this level. Of the lower cervical spine dextrocurvature Skull base and vertebrae: Redemonstrated chronic erosions along the base of the odontoid with posterior pannus. There is also severe erosive change along the right atlantoaxial joint, similar. Cystic degenerative change of the dens, similar. C3-C5 ACDF with corpectomy at C4. No evidence of hardware fracture. Similar alignment. Fused facet joints at C3-C4 and C6-C7. New T2 (20%) and T3 (30%) vertebral body height loss with associated superior endplate sclerosis and lucency through the T3 superior endplate. Findings are partially imaged. Soft tissues and spinal canal: Motion limited evaluation without definite prevertebral edema or  visible canal hematoma. Disc levels: Severe multilevel degenerative change. Craniocervical degenerative changes described above. Multilevel bilateral foraminal stenosis. Upper chest: Partially imaged nodular opacities in the posterior right upper lobe, reportedly biopsy-proven non-small cell lung cancer IMPRESSION: CT cervical spine: 1. Acute T2 and T3 compression  fractures, described above and partially imaged. Recommend dedicated CT of the thoracic spine to further characterize and to fully evaluate the thoracic spine. 2. Similar postoperative changes of C3-C4 ACDF and C4 corpectomy with similar alignment. 3. Severe multilevel degenerative change, including severe craniocervical degenerative change and multilevel foraminal stenosis. MRI could further characterize the canal/cord/foramina if clinically indicated. 4. Partially imaged nodular opacities in the posterior right upper lobe, reportedly biopsy-proven non-small cell lung cancer CT head: No evidence of acute intracranial abnormality. Electronically Signed   By: Margaretha Sheffield M.D.   On: 04/26/2021 14:41   CT Cervical Spine Wo Contrast  Result Date: 04/26/2021 CLINICAL DATA:  Neck trauma (Age >= 65y); Head trauma, minor (Age >= 65y) EXAM: CT HEAD WITHOUT CONTRAST CT CERVICAL SPINE WITHOUT CONTRAST TECHNIQUE: Multidetector CT imaging of the head and cervical spine was performed following the standard protocol without intravenous contrast. Multiplanar CT image reconstructions of the cervical spine were also generated. COMPARISON:  CT head February 26, 2021. CT cervical spine November 22, 2020. FINDINGS: CT HEAD FINDINGS Brain: No evidence of acute large vascular infarction, hemorrhage, hydrocephalus, extra-axial collection or mass lesion/mass effect. Similar patchy white matter hypoattenuation, nonspecific but compatible with chronic microvascular ischemic disease. Vascular: No hyperdense vessel identified. Calcific intracranial atherosclerosis. Skull: No acute fracture. Sinuses/Orbits: Opacified left anterior ethmoid air cell. Otherwise, largely clear sinuses. Unremarkable orbits. Other: No mastoid effusions. CT CERVICAL SPINE FINDINGS Motion limited study.  Within this limitation: Alignment: Similar alignment to the prior. Similar anterolisthesis of C6 on C7 with similar focal kyphosis at this level. Of the lower  cervical spine dextrocurvature Skull base and vertebrae: Redemonstrated chronic erosions along the base of the odontoid with posterior pannus. There is also severe erosive change along the right atlantoaxial joint, similar. Cystic degenerative change of the dens, similar. C3-C5 ACDF with corpectomy at C4. No evidence of hardware fracture. Similar alignment. Fused facet joints at C3-C4 and C6-C7. New T2 (20%) and T3 (30%) vertebral body height loss with associated superior endplate sclerosis and lucency through the T3 superior endplate. Findings are partially imaged. Soft tissues and spinal canal: Motion limited evaluation without definite prevertebral edema or visible canal hematoma. Disc levels: Severe multilevel degenerative change. Craniocervical degenerative changes described above. Multilevel bilateral foraminal stenosis. Upper chest: Partially imaged nodular opacities in the posterior right upper lobe, reportedly biopsy-proven non-small cell lung cancer IMPRESSION: CT cervical spine: 1. Acute T2 and T3 compression fractures, described above and partially imaged. Recommend dedicated CT of the thoracic spine to further characterize and to fully evaluate the thoracic spine. 2. Similar postoperative changes of C3-C4 ACDF and C4 corpectomy with similar alignment. 3. Severe multilevel degenerative change, including severe craniocervical degenerative change and multilevel foraminal stenosis. MRI could further characterize the canal/cord/foramina if clinically indicated. 4. Partially imaged nodular opacities in the posterior right upper lobe, reportedly biopsy-proven non-small cell lung cancer CT head: No evidence of acute intracranial abnormality. Electronically Signed   By: Margaretha Sheffield M.D.   On: 04/26/2021 14:41   CT CHEST ABDOMEN PELVIS W CONTRAST  Result Date: 04/26/2021 CLINICAL DATA:  Dementia patient found on the floor after a fall. EXAM: CT CHEST, ABDOMEN, AND PELVIS WITH CONTRAST TECHNIQUE:  Multidetector CT imaging of the chest,  abdomen and pelvis was performed following the standard protocol during bolus administration of intravenous contrast. CONTRAST:  23mL OMNIPAQUE IOHEXOL 300 MG/ML  SOLN COMPARISON:  10/28/2020 FINDINGS: CT CHEST FINDINGS Cardiovascular: Art size upper limits of normal. No pericardial fluid. Coronary artery calcification and aortic atherosclerotic calcification as seen previously. Mediastinum/Nodes: No mediastinal or hilar mass or lymphadenopathy. Lungs/Pleura: Indistinct mass lesion at the right apex, apparently malignant by biopsy, appears slightly larger. This is difficult to measure precisely because of the morphology. Elsewhere, 6 mm subsolid nodule previously seen in the superior segment of the left lower lobe is unchanged, axial image 37. No progressive feature. No pleural effusion seen on either side. No evidence of infectious pneumonia or pulmonary collapse. Musculoskeletal: No rib fracture on the right. On the left, there are numerous subacute healing rib fractures. There are subacute but somewhat more recent appearing rib fractures posteriorly affecting the left seventh, eighth and ninth ribs. Old appearing compression deformity at T9. Minor superior endplate fracture at T3, not present in June of this year. Sternal fracture which appears subacute but not united, not present in June of this year. CT ABDOMEN PELVIS FINDINGS Hepatobiliary: No liver parenchymal abnormality. Previous cholecystectomy. Chronic biliary ductal prominence. Pancreas: Negative Spleen: Normal Adrenals/Urinary Tract: Adrenal glands are normal. Multiple renal cysts on the left. Small nonobstructing renal calculi. Bladder is normal. Stomach/Bowel: Previous gastric surgery. Left inguinal hernia containing several loops of small intestine. No evidence of obstruction or incarceration. No acute colon finding. Vascular/Lymphatic: Aortic atherosclerosis. No aneurysm. IVC is normal. No retroperitoneal  adenopathy. Reproductive: No pelvic mass. Other: No free fluid or air. Musculoskeletal: Distant discectomy and fusion procedure L4 to the sacrum. Likely nonunion at L5-S1. subacute fracture of the pubic rami on the right, not yet healed. Acute intertrochanteric fracture of the left femur. IMPRESSION: Slight enlargement of an indistinct irregular mass lesion at the right apex, reported to be malignant by prior biopsy. No change in a 6 mm subsolid nodule in the superior segment of the left lower lobe. Multiple subacute rib fractures on the left of varying ages, not yet healed. No sign of acute fracture today. Old compression deformity at T9. More recent superior endplate fracture at T3, not present in June of this year, but probably subacute. Subacute sternal fracture, not yet united. No evidence of acute abdominal organ injury. Aortic atherosclerosis. Left inguinal hernia containing small bowel but no evidence of obstruction or incarceration. Subacute pubic rami fractures on the right. Acute intertrochanteric femoral fracture on the left. 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 Electronically Signed   By: Nelson Chimes M.D.   On: 04/26/2021 16:36   CT T-SPINE NO CHARGE  Result Date: 04/26/2021 CLINICAL DATA:  Fall with back pain EXAM: CT THORACIC SPINE WITHOUT CONTRAST TECHNIQUE: Multidetector CT images of the thoracic were obtained using the standard protocol without intravenous contrast. COMPARISON:  Cervical CT 11/22/2020.  CT chest 10/28/2020. FINDINGS: Alignment: Increased kyphotic curvature. Vertebrae: Old compression fracture at T9, not progressed since June. Newly seen superior endplate fractures at T2, T3 and T4, likely subacute. Loss of height 20% or less at those levels. No acute thoracic region fracture identified. Paraspinal and other soft tissues: Negative Disc levels: No significant thoracic degenerative disease. No bony stenosis of the canal or foramina. IMPRESSION: No acute thoracic spine  fracture suspected. Old fracture of T9. Likely subacute fractures at T2, T3 and T4, with only minimal loss of height. Electronically Signed   By: Nelson Chimes M.D.   On: 04/26/2021  16:38   CT L-SPINE NO CHARGE  Result Date: 04/26/2021 CLINICAL DATA:  [place].  Assess for lumbar compression fracture. EXAM: CT LUMBAR SPINE WITHOUT CONTRAST TECHNIQUE: Multidetector CT imaging of the lumbar spine was performed without intravenous contrast administration. Multiplanar CT image reconstructions were also generated. COMPARISON:  MRI 02/27/2021 FINDINGS: Segmentation: 5 lumbar type vertebral bodies. Alignment: Fixed anterolisthesis at L4-5 of 8 mm. Anterolisthesis at L5-S1 of 7 mm. Vertebrae: No lumbar region fracture. Likely solid union at the L4-5 level. Nonunion at L5-S1, with pronounced lucency surrounding the sacral screws and nitrogen gas in the L5-S1 disc level. Paraspinal and other soft tissues: Negative Disc levels: L1-2 and L2-3 are negative. L3-4 shows mild bulging of the disc and facet hypertrophy. See above regarding the surgical region from L4 to the sacrum. IMPRESSION: No acute traumatic finding in the lumbar region. Previous discectomy and fusion procedure from L4 to the sacrum. 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 Electronically Signed   By: Nelson Chimes M.D.   On: 04/26/2021 16:41   DG Knee Left Port  Result Date: 04/26/2021 CLINICAL DATA:  Un witnessed fall, left knee pain EXAM: PORTABLE LEFT KNEE - 1-2 VIEW COMPARISON:  02/06/2006 FINDINGS: Frontal and lateral views of the left knee demonstrate 3 component left knee arthroplasty in the expected position without signs of acute complication. There are no acute displaced fractures. Small joint effusion. Diffuse soft tissue edema. IMPRESSION: 1. No acute displaced fracture. 2. Diffuse soft tissue edema and trace left knee effusion. 3. Unremarkable 3 component left knee  arthroplasty. Electronically Signed   By: Randa Ngo M.D.   On: 04/26/2021 17:05   DG C-Arm 1-60 Min-No Report  Result Date: 04/27/2021 Fluoroscopy was utilized by the requesting physician.  No radiographic interpretation.   DG HIP PORT UNILAT W OR W/O PELVIS 1V LEFT  Result Date: 04/27/2021 CLINICAL DATA:  Hip fracture EXAM: DG HIP (WITH OR WITHOUT PELVIS) 1V PORT LEFT COMPARISON:  04/26/2021 FINDINGS: Interval intramedullary rod and distal screw fixation of the left femur for intertrochanteric fracture. There is anatomic alignment. Gas in the soft tissues consistent with recent surgery. IMPRESSION: Interval surgical fixation of left intertrochanteric fracture with expected postsurgical change Electronically Signed   By: Donavan Foil M.D.   On: 04/27/2021 18:39   DG HIP OPERATIVE UNILAT WITH PELVIS LEFT  Result Date: 04/27/2021 CLINICAL DATA:  Surgical internal fixation of left proximal femur fracture. EXAM: OPERATIVE left HIP (WITH PELVIS IF PERFORMED) 5 VIEWS TECHNIQUE: Fluoroscopic spot image(s) were submitted for interpretation post-operatively. Radiation exposure index: 15.52 mGy. COMPARISON:  April 26, 2021. FINDINGS: Five intraoperative fluoroscopic images were obtained of the left hip. These images demonstrate surgical internal fixation of proximal left femoral intertrochanteric fracture. IMPRESSION: Fluoroscopic guidance provided during surgical internal fixation of proximal left femoral intertrochanteric fracture. Electronically Signed   By: Marijo Conception M.D.   On: 04/27/2021 16:01   DG Hip Unilat With Pelvis 2-3 Views Left  Result Date: 04/26/2021 CLINICAL DATA:  [place], left hip deformity EXAM: DG HIP (WITH OR WITHOUT PELVIS) 2-3V LEFT COMPARISON:  02/27/2021 FINDINGS: Frontal view of the pelvis as well as frontal and cross-table lateral views of the left hip are obtained. There is a displaced intertrochanteric left hip fracture, with mild impaction and varus angulation. No  dislocation. There are fractures through the right superior and inferior pubic rami which are new since prior study. There is some  sclerosis along the margins of these fractures and possible periosteal reaction, which could suggest subacute injury. Fracture lines are still readily apparent [facility] for. The bones are diffusely osteopenic. Sacroiliac joints are unremarkable. Stable postsurgical changes at the lumbosacral junction. IMPRESSION: 1. Acute intertrochanteric left hip fracture, with mild impaction and varus angulation. 2. Acute to subacute fractures through the right superior and inferior pubic rami. 3. Diffuse osteopenia. Electronically Signed   By: Randa Ngo M.D.   On: 04/26/2021 15:21     Medical Consultants:   None.   Subjective:    [name] tolerating her diet has required minimal pain medication.  Objective:    Vitals:   04/27/21 1602 04/27/21 1910 04/27/21 2046 04/28/21 0720  BP: (!) 153/67  (!) 126/97 128/72  Pulse: 78  74 65  Resp: 18  16 17   Temp: 98 F (36.7 C)  (!) 97.5 F (36.4 C) 97.6 F (36.4 C)  TempSrc: Oral  Oral Oral  SpO2: 95% 96% 100% 100%  Weight: 59 kg     Height: 5\' 4"  (1.626 m)      SpO2: 100 % O2 Flow Rate (L/min): 2 L/min   Intake/Output Summary (Last 24 hours) at 04/28/2021 0734 Last data filed at 04/28/2021 0631 Gross per 24 hour  Intake 1517.01 ml  Output 800 ml  Net 717.01 ml   Filed Weights   04/27/21 1235 04/27/21 1602  Weight: 59 kg 59 kg    Exam: General exam: In no acute distress. Respiratory system: Good air movement and clear to auscultation. Cardiovascular system: S1 & S2 heard, RRR. No JVD. Gastrointestinal system: Abdomen is nondistended, soft and nontender.  Extremities: No pedal edema. Skin: No rashes, lesions or ulcers Psychiatry: Judgement and insight appear normal. Mood & affect appropriate.   Data Reviewed:    Labs: Basic Metabolic Panel: Recent Labs  Lab 04/26/21 1319 04/27/21 0323  04/28/21 0158  NA 142 138 137  K 4.3 4.6 4.5  CL 113* 111 109  CO2 23 22 20*  GLUCOSE 120* 131* 129*  BUN 40* 29* 25*  CREATININE 1.23* 1.01* 1.15*  CALCIUM 8.9 8.5* 8.3*  MG 2.2 1.9  --   PHOS  --  3.1  --     GFR Estimated Creatinine Clearance: 33.1 mL/min (A) (by C-G formula based on SCr of 1.15 mg/dL (H)). Liver Function Tests: Recent Labs  Lab 04/26/21 1319 04/27/21 0323  AST 22  --   ALT 19  --   ALKPHOS 168*  --   BILITOT 0.5  --   PROT 6.6  --   ALBUMIN 3.0* 2.4*    No results for input(s): LIPASE, AMYLASE in the last 168 hours. No results for input(s): AMMONIA in the last 168 hours. Coagulation profile Recent Labs  Lab 04/27/21 0323  INR 1.1    COVID-19 Labs  Recent Labs    04/27/21 0323  FERRITIN 236     Lab Results  Component Value Date   SARSCOV2NAA NEGATIVE 04/26/2021   SARSCOV2NAA NEGATIVE 03/04/2021   SARSCOV2NAA NEGATIVE 02/27/2021   [place] NEGATIVE 11/26/2020    CBC: Recent Labs  Lab 04/26/21 1319 04/27/21 0433 04/28/21 0158  WBC 8.3 5.5 6.4  NEUTROABS 6.3  --   --   HGB 9.3* 8.2* 8.1*  HCT 32.1* 27.0* 27.5*  MCV 99.1 97.5 97.5  PLT 264 203 190    Cardiac Enzymes: Recent Labs  Lab 04/26/21 1319 04/27/21 0323 04/28/21 0158  CKTOTAL 110 140 137  BNP (last 3 results) No results for input(s): PROBNP in the last 8760 hours. CBG: Recent Labs  Lab 04/26/21 1311  GLUCAP 98    D-Dimer: No results for input(s): DDIMER in the last 72 hours. Hgb A1c: No results for input(s): HGBA1C in the last 72 hours. Lipid Profile: No results for input(s): CHOL, HDL, LDLCALC, TRIG, CHOLHDL, LDLDIRECT in the last 72 hours. Thyroid function studies: No results for input(s): TSH, T4TOTAL, T3FREE, THYROIDAB in the last 72 hours.  Invalid input(s): FREET3 Anemia work up: Recent Labs    04/27/21 0323  VITAMINB12 325  FOLATE 23.7  FERRITIN 236  TIBC 246*  IRON 34  RETICCTPCT 1.9    Sepsis Labs: Recent Labs  Lab  04/26/21 1300 04/26/21 1319 04/27/21 0433 04/28/21 0158  WBC  --  8.3 5.5 6.4  LATICACIDVEN 1.0  --   --   --     Microbiology Recent Results (from the past 240 hour(s))  Blood culture (routine x 2)     Status: None (Preliminary result)   Collection Time: 04/26/21  1:19 PM   Specimen: BLOOD  Result Value Ref Range Status   Specimen Description BLOOD RIGHT ANTECUBITAL  Final   Special Requests   Final    BOTTLES DRAWN AEROBIC AND ANAEROBIC Blood Culture results may not be optimal due to an excessive volume of blood received in culture bottles   Culture  Setup Time   Final    GRAM POSITIVE RODS ANAEROBIC BOTTLE ONLY CRITICAL RESULT CALLED TO, READ BACK BY AND VERIFIED WITH: V BRYK,[email or web address]@[id number]  04/17/21 mk    Culture   Final    NO GROWTH 2 DAYS Performed at [facility], [address], [place], [place] [address]    Report Status PENDING  Incomplete  Blood culture (routine x 2)     Status: None (Preliminary result)   Collection Time: 04/26/21  1:19 PM   Specimen: BLOOD  Result Value Ref Range Status   Specimen Description BLOOD BLOOD RIGHT HAND  Final   Special Requests   Final    BOTTLES DRAWN AEROBIC AND ANAEROBIC Blood Culture adequate volume   Culture   Final    NO GROWTH 2 DAYS Performed at [facility], [place] [address] [place], [place] [address]    Report Status PENDING  Incomplete  Resp Panel by RT-PCR (Flu A&B, Covid) Nasopharyngeal Swab     Status: None   Collection Time: 04/26/21  1:34 PM   Specimen: Nasopharyngeal Swab; Nasopharyngeal(NP) swabs in vial transport medium  Result Value Ref Range Status   SARS Coronavirus 2 by RT PCR NEGATIVE NEGATIVE Final    Comment: (NOTE) SARS-CoV-2 target nucleic acids are NOT DETECTED.  The SARS-CoV-2 RNA is generally detectable in upper respiratory specimens during the acute phase of infection. The lowest concentration of SARS-CoV-2 viral copies this assay can detect is 138 copies/mL. A negative result does not  preclude SARS-Cov-2 infection and should not be used as the sole basis for treatment or other patient management decisions. A negative result may occur with  improper specimen collection/handling, submission of specimen other than nasopharyngeal swab, presence of viral mutation(s) within the areas targeted by this assay, and inadequate number of viral copies(<138 copies/mL). A negative result must be combined with clinical observations, patient history, and epidemiological information. The expected result is Negative.  Fact Sheet for Patients:  EntrepreneurPulse.com.au  Fact Sheet for Healthcare Providers:  IncredibleEmployment.be  This test is no t yet approved or cleared by  the [employer] and  has been authorized for detection and/or diagnosis of SARS-CoV-2 by FDA under an Emergency Use Authorization (EUA). This EUA will remain  in effect (meaning this test can be used) for the duration of the COVID-19 declaration under Section 564(b)(1) of the Act, 21 U.S.C.section 360bbb-3(b)(1), unless the authorization is terminated  or revoked sooner.       Influenza A by PCR NEGATIVE NEGATIVE Final   Influenza B by PCR NEGATIVE NEGATIVE Final    Comment: (NOTE) The Xpert Xpress SARS-CoV-2/FLU/RSV plus assay is intended as an aid in the diagnosis of influenza from Nasopharyngeal swab specimens and should not be used as a sole basis for treatment. Nasal washings and aspirates are unacceptable for Xpert Xpress SARS-CoV-2/FLU/RSV testing.  Fact Sheet for Patients: EntrepreneurPulse.com.au  Fact Sheet for Healthcare Providers: IncredibleEmployment.be  This test is not yet approved or cleared by the [place] FDA and has been authorized for detection and/or diagnosis of SARS-CoV-2 by FDA under an Emergency Use Authorization (EUA). This EUA will remain in effect (meaning this test can be used) for the  duration of the COVID-19 declaration under Section 564(b)(1) of the Act, 21 U.S.C. section 360bbb-3(b)(1), unless the authorization is terminated or revoked.  Performed at [facility], [place] [address] [place], [place] [address]   Surgical pcr screen     Status: Abnormal   Collection Time: 04/27/21 12:33 PM   Specimen: Nasal Mucosa; Nasal Swab  Result Value Ref Range Status   MRSA, PCR POSITIVE (A) NEGATIVE Final    Comment: RESULT CALLED TO, READ BACK BY AND VERIFIED WITH:  ASHLEY D 1449 ADL    Staphylococcus aureus POSITIVE (A) NEGATIVE Final    Comment: (NOTE) The Xpert SA Assay (FDA approved for NASAL specimens in patients 70 years of age and older), is one component of a comprehensive surveillance program. It is not intended to diagnose infection nor to guide or monitor treatment. Performed at [facility], [place] [address], [place], [place] [address]      Medications:    acetaminophen  1,000 mg Oral Q8H   aspirin EC  81 mg Oral BID   busPIRone  5 mg Oral BID   cholecalciferol  1,000 Units Oral Daily   docusate sodium  100 mg Oral BID   donepezil  10 mg Oral QHS   gabapentin  100 mg Oral BID   memantine  5 mg Oral Daily   metoprolol tartrate  2.5 mg Intravenous Q6H   mometasone-formoterol  2 puff Inhalation BID   multivitamin with minerals  1 tablet Oral Daily   pantoprazole  40 mg Oral BID   QUEtiapine  50 mg Oral QHS   Continuous Infusions:  sodium chloride 50 mL/hr at 04/27/21 1712    ceFAZolin (ANCEF) IV 2 g (04/28/21 0534)   methocarbamol (ROBAXIN) IV        LOS: 2 days   Charlynne Cousins  [facility]  04/28/2021, 7:34 AM

## 2021-04-29 ENCOUNTER — Other Ambulatory Visit: Payer: Medicare Other

## 2021-04-29 LAB — CULTURE, BLOOD (ROUTINE X 2)

## 2021-04-29 LAB — BASIC METABOLIC PANEL
Anion gap: 6 (ref 5–15)
BUN: 24 mg/dL — ABNORMAL HIGH (ref 8–23)
CO2: 20 mmol/L — ABNORMAL LOW (ref 22–32)
Calcium: 8.1 mg/dL — ABNORMAL LOW (ref 8.9–10.3)
Chloride: 110 mmol/L (ref 98–111)
Creatinine, Ser: 1.24 mg/dL — ABNORMAL HIGH (ref 0.44–1.00)
GFR, Estimated: 44 mL/min — ABNORMAL LOW (ref >60)
Glucose, Bld: 108 mg/dL — ABNORMAL HIGH (ref 70–99)
Potassium: 3.8 mmol/L (ref 3.5–5.1)
Sodium: 136 mmol/L (ref 135–145)

## 2021-04-29 LAB — CBC
HCT: 23.3 % — ABNORMAL LOW (ref 36.0–46.0)
Hemoglobin: 7.2 g/dL — ABNORMAL LOW (ref 12.0–15.0)
MCH: 29.9 pg (ref 26.0–34.0)
MCHC: 30.9 g/dL (ref 30.0–36.0)
MCV: 96.7 fL (ref 80.0–100.0)
Platelets: 193 10*3/uL (ref 150–400)
RBC: 2.41 MIL/uL — ABNORMAL LOW (ref 3.87–5.11)
RDW: 14.5 % (ref 11.5–15.5)
WBC: 5.6 10*3/uL (ref 4.0–10.5)
nRBC: 0 % (ref 0.0–0.2)

## 2021-04-29 MED ORDER — FUROSEMIDE 20 MG PO TABS
20.0000 mg | ORAL_TABLET | ORAL | Status: DC
  Administered 2021-04-29 – 2021-05-02 (×2): 20 mg via ORAL
  Filled 2021-04-29 (×2): qty 1

## 2021-04-29 MED ORDER — ACETAMINOPHEN 500 MG PO TABS: 1000.0000 mg | ORAL_TABLET | Freq: Three times a day (TID) | ORAL | 0 refills | Status: AC | PRN

## 2021-04-29 MED ORDER — ASPIRIN 81 MG PO TBEC: 81.0000 mg | DELAYED_RELEASE_TABLET | Freq: Two times a day (BID) | ORAL | 0 refills | Status: DC

## 2021-04-29 MED ORDER — METHOCARBAMOL 500 MG PO TABS: 500.0000 mg | ORAL_TABLET | Freq: Four times a day (QID) | ORAL | 0 refills | Status: AC | PRN

## 2021-04-29 NOTE — Progress Notes (Signed)
[facility] PROGRESS NOTE    Progress Note  Sara Fox  JQB:[id number] DOB: May 19, 1940 DOA: 04/26/2021 PCP: [facility]     Brief Narrative:   Sara Fox is an 80 y.o. female past medical history of Alzheimer's dementia, ambulatory dysfunction, combined systolic and diastolic heart failure, COPD, stage IIIa chronic kidney disease, stage IIA NSLC lsince 2021 who underwent curative SBRT  with a suspicious small lower right paratracheal lymph node, recently discharged from the hospital, found down by EMS by her son, he relates that she has been complaining of no symptoms.  In the ED her hemoglobin was 9 creatinine 1.3, SARS-CoV-2 and influenza were negative CT of the head no evidence of acute intracranial abnormality, CT of the C-spine showed T2 and T3 compression fracture, left hip x-ray showed left acute intertrochanteric hip fracture with mild amputation, acute and subacute fracture throughout the superior and inferior pubic rami.  Orthopedic surgery was consulted who recommended surgical intervention  Assessment/Plan:   Unwitnessed fall leading to closed left hip fracture, initial encounter [facility]): Orthopedic surgery was consulted who recommended ORIF on 04/27/2021. Patient has only required Tylenol for pain discontinue narcotics, continue Robaxin.   Continue MiraLAX tolerating her diet. PT consulted, needs skilled nursing facility.  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 Continue tylenol for pain. Physical therapy evaluated the patient will need SNF  History of lung cancer status post therapy: Need outpatient follow-up with oncology.  Chronic combined systolic and diastolic heart failure: 2D echo in August 2022 showed an EF of 55% no wall motion abnormalities. Allow a diet resume her Lasix.  Chronic COPD/obstructive sleep apnea: Continue inhalers.  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  Chronic kidney disease stage IIIa: Creatinine appears to be at baseline.  Normocytic anemia: Hemoglobin has remained relatively stable, her hemoglobin is 8.1  Ethics/goals of care: Had a long discussion with the son who is the next of kin has been taking care of her for the last 10 years she currently lives with him.  They met with palliative care they would like to go to skilled nursing facility  Essential hypertension: Resume home dose of beta-blocker, continue IV metoprolol every 6 hours.  Sacral decubitus ulcer stage III present on admission: RN Pressure Injury Documentation: Pressure Injury 07/18/17 Stage II -  Partial thickness loss of dermis presenting as a shallow open ulcer with a red, pink wound bed without slough. (Active)  07/18/17 0730  Location: Sacrum  Location Orientation:   Staging: Stage II -  Partial thickness loss of dermis presenting as a shallow open ulcer with a red, pink wound bed without slough.  Wound Description (Comments):   Present on Admission: Yes     DVT prophylaxis: lovenox Family Communication:none Status is: Inpatient  Remains inpatient appropriate because: Mechanical fall leading to a left hip fracture with superior and inferior pubic ramus fracture needing surgical intervention.     Code Status:     Code Status Orders  (From admission, onward)           Start     Ordered   04/26/21 1750  Do not attempt resuscitation (DNR)  Continuous       Question Answer Comment  In the event of cardiac or respiratory ARREST Do not call a code blue   In the event of cardiac or respiratory ARREST  Do not perform Intubation, CPR, defibrillation or ACLS   In the event of cardiac or respiratory ARREST Use medication by any route, position, wound care, and other measures to relive pain and suffering. May use oxygen, suction and  manual treatment of airway obstruction as needed for comfort.      04/26/21 1749           Code Status History     Date Active Date Inactive Code Status Order ID Comments User Context   02/27/2021 1447 03/05/2021 1822 DNR [id number]  Orma Flaming, MD ED   11/22/2020 1906 11/27/2020 1711 DNR [id number]  Orma Flaming, MD ED   07/11/2017 0002 07/20/2017 2327 Full Code [id number]  Norval Morton, MD ED   09/14/2014 1337 09/15/2014 1546 Full Code [id number]  Leonie Man, MD Inpatient   09/12/2014 0849 09/14/2014 1337 Full Code [id number]  [name], NP Inpatient         IV Access:   Peripheral IV   Procedures and diagnostic studies:   DG C-Arm 1-60 Min-No Report  Result Date: 04/27/2021 CLINICAL DATA:  Surgical internal fixation of left proximal femur fracture. EXAM: OPERATIVE left HIP (WITH PELVIS IF PERFORMED) 5 VIEWS TECHNIQUE: Fluoroscopic spot image(s) were submitted for interpretation post-operatively. Radiation exposure index: 15.52 mGy. COMPARISON:  April 26, 2021. FINDINGS: Five intraoperative fluoroscopic images were obtained of the left hip. These images demonstrate surgical internal fixation of proximal left femoral intertrochanteric fracture. IMPRESSION: Fluoroscopic guidance provided during surgical internal fixation of proximal left femoral intertrochanteric fracture. Electronically Signed   By: Marijo Conception M.D.   On: 04/27/2021 16:01   DG HIP PORT UNILAT W OR W/O PELVIS 1V LEFT  Result Date: 04/27/2021 CLINICAL DATA:  Hip fracture EXAM: DG HIP (WITH OR WITHOUT PELVIS) 1V PORT LEFT COMPARISON:  04/26/2021 FINDINGS: Interval intramedullary rod and distal screw fixation of the left femur for intertrochanteric fracture. There is anatomic alignment. Gas in the soft tissues consistent with recent surgery. IMPRESSION: Interval surgical fixation of left intertrochanteric fracture with expected postsurgical change Electronically Signed   By: Donavan Foil M.D.   On:  04/27/2021 18:39   DG HIP OPERATIVE UNILAT WITH PELVIS LEFT  Result Date: 04/27/2021 CLINICAL DATA:  Surgical internal fixation of left proximal femur fracture. EXAM: OPERATIVE left HIP (WITH PELVIS IF PERFORMED) 5 VIEWS TECHNIQUE: Fluoroscopic spot image(s) were submitted for interpretation post-operatively. Radiation exposure index: 15.52 mGy. COMPARISON:  April 26, 2021. FINDINGS: Five intraoperative fluoroscopic images were obtained of the left hip. These images demonstrate surgical internal fixation of proximal left femoral intertrochanteric fracture. IMPRESSION: Fluoroscopic guidance provided during surgical internal fixation of proximal left femoral intertrochanteric fracture. Electronically Signed   By: Marijo Conception M.D.   On: 04/27/2021 16:01     Medical Consultants:   None.   Subjective:    [name] t has tolerated her diet only requiring Tylenol for pain no complaints.  Objective:    Vitals:   04/28/21 2009 04/28/21 2021 04/29/21 0642 04/29/21 0802  BP:  (!) 98/44 (!) 107/58   Pulse:  95 78   Resp:  20 18   Temp:  97.9 F (36.6 C) 98.1 F (36.7 C)   TempSrc:  Oral Axillary   SpO2: 100% 98% 95% 100%  Weight:      Height:       SpO2: 100 % O2 Flow Rate (L/min): 2 L/min   Intake/Output Summary (Last 24 hours) at 04/29/2021 0857 Last data filed at 04/28/2021  1400 Gross per 24 hour  Intake 727.02 ml  Output --  Net 727.02 ml    Filed Weights   04/27/21 1235 04/27/21 1602  Weight: 59 kg 59 kg    Exam: General exam: In no acute distress. Respiratory system: Good air movement and clear to auscultation. Cardiovascular system: S1 & S2 heard, RRR. No JVD. Gastrointestinal system: Abdomen is nondistended, soft and nontender.  Extremities: No pedal edema. Skin: No rashes, lesions or ulcers   Data Reviewed:    Labs: Basic Metabolic Panel: Recent Labs  Lab 04/26/21 1319 04/27/21 0323 04/28/21 0158 04/29/21 0052  NA 142 138 137 136  K 4.3  4.6 4.5 3.8  CL 113* 111 109 110  CO2 23 22 20* 20*  GLUCOSE 120* 131* 129* 108*  BUN 40* 29* 25* 24*  CREATININE 1.23* 1.01* 1.15* 1.24*  CALCIUM 8.9 8.5* 8.3* 8.1*  MG 2.2 1.9  --   --   PHOS  --  3.1  --   --     GFR Estimated Creatinine Clearance: 30.7 mL/min (A) (by C-G formula based on SCr of 1.24 mg/dL (H)). Liver Function Tests: Recent Labs  Lab 04/26/21 1319 04/27/21 0323  AST 22  --   ALT 19  --   ALKPHOS 168*  --   BILITOT 0.5  --   PROT 6.6  --   ALBUMIN 3.0* 2.4*    No results for input(s): LIPASE, AMYLASE in the last 168 hours. No results for input(s): AMMONIA in the last 168 hours. Coagulation profile Recent Labs  Lab 04/27/21 0323  INR 1.1    COVID-19 Labs  Recent Labs    04/27/21 0323  FERRITIN 236     Lab Results  Component Value Date   SARSCOV2NAA NEGATIVE 04/26/2021   SARSCOV2NAA NEGATIVE 03/04/2021   SARSCOV2NAA NEGATIVE 02/27/2021   [place] NEGATIVE 11/26/2020    CBC: Recent Labs  Lab 04/26/21 1319 04/27/21 0433 04/28/21 0158 04/29/21 0052  WBC 8.3 5.5 6.4 5.6  NEUTROABS 6.3  --   --   --   HGB 9.3* 8.2* 8.1* 7.2*  HCT 32.1* 27.0* 27.5* 23.3*  MCV 99.1 97.5 97.5 96.7  PLT 264 203 190 193    Cardiac Enzymes: Recent Labs  Lab 04/26/21 1319 04/27/21 0323 04/28/21 0158  CKTOTAL 110 140 137    BNP (last 3 results) No results for input(s): PROBNP in the last 8760 hours. CBG: Recent Labs  Lab 04/26/21 1311  GLUCAP 98    D-Dimer: No results for input(s): DDIMER in the last 72 hours. Hgb A1c: No results for input(s): HGBA1C in the last 72 hours. Lipid Profile: No results for input(s): CHOL, HDL, LDLCALC, TRIG, CHOLHDL, LDLDIRECT in the last 72 hours. Thyroid function studies: No results for input(s): TSH, T4TOTAL, T3FREE, THYROIDAB in the last 72 hours.  Invalid input(s): FREET3 Anemia work up: Recent Labs    04/27/21 0323  VITAMINB12 325  FOLATE 23.7  FERRITIN 236  TIBC 246*  IRON 34  RETICCTPCT  1.9    Sepsis Labs: Recent Labs  Lab 04/26/21 1300 04/26/21 1319 04/27/21 0433 04/28/21 0158 04/29/21 0052  WBC  --  8.3 5.5 6.4 5.6  LATICACIDVEN 1.0  --   --   --   --     Microbiology Recent Results (from the past 240 hour(s))  Blood culture (routine x 2)     Status: Abnormal   Collection Time: 04/26/21  1:19 PM   Specimen: BLOOD  Result Value Ref Range  Status   Specimen Description BLOOD RIGHT ANTECUBITAL  Final   Special Requests   Final    BOTTLES DRAWN AEROBIC AND ANAEROBIC Blood Culture results may not be optimal due to an excessive volume of blood received in culture bottles   Culture  Setup Time   Final    GRAM POSITIVE RODS ANAEROBIC BOTTLE ONLY CRITICAL RESULT CALLED TO, READ BACK BY AND VERIFIED WITH: V [email or web address],[email or web address]  04/17/21 mk    Culture (A)  Final    BACILLUS SPECIES Standardized susceptibility testing for this organism is not available. Performed at [facility], [place] [address], [place], [place] [address]    Report Status 04/29/2021 FINAL  Final  Blood culture (routine x 2)     Status: None (Preliminary result)   Collection Time: 04/26/21  1:19 PM   Specimen: BLOOD  Result Value Ref Range Status   Specimen Description BLOOD BLOOD RIGHT HAND  Final   Special Requests   Final    BOTTLES DRAWN AEROBIC AND ANAEROBIC Blood Culture adequate volume   Culture   Final    NO GROWTH 3 DAYS Performed at [facility], [place] [address] [place], [place] [address]    Report Status PENDING  Incomplete  Resp Panel by RT-PCR (Flu A&B, Covid) Nasopharyngeal Swab     Status: None   Collection Time: 04/26/21  1:34 PM   Specimen: Nasopharyngeal Swab; Nasopharyngeal(NP) swabs in vial transport medium  Result Value Ref Range Status   SARS Coronavirus 2 by RT PCR NEGATIVE NEGATIVE Final    Comment: (NOTE) SARS-CoV-2 target nucleic acids are NOT DETECTED.  The SARS-CoV-2 RNA is generally detectable in upper respiratory specimens during the acute phase of  infection. The lowest concentration of SARS-CoV-2 viral copies this assay can detect is 138 copies/mL. A negative result does not preclude SARS-Cov-2 infection and should not be used as the sole basis for treatment or other patient management decisions. A negative result may occur with  improper specimen collection/handling, submission of specimen other than nasopharyngeal swab, presence of viral mutation(s) within the areas targeted by this assay, and inadequate number of viral copies(<138 copies/mL). A negative result must be combined with clinical observations, patient history, and epidemiological information. The expected result is Negative.  Fact Sheet for Patients:  EntrepreneurPulse.com.au  Fact Sheet for Healthcare Providers:  IncredibleEmployment.be  This test is no t yet approved or cleared by the [place] FDA and  has been authorized for detection and/or diagnosis of SARS-CoV-2 by FDA under an Emergency Use Authorization (EUA). This EUA will remain  in effect (meaning this test can be used) for the duration of the COVID-19 declaration under Section 564(b)(1) of the Act, 21 U.S.C.section 360bbb-3(b)(1), unless the authorization is terminated  or revoked sooner.       Influenza A by PCR NEGATIVE NEGATIVE Final   Influenza B by PCR NEGATIVE NEGATIVE Final    Comment: (NOTE) The Xpert Xpress SARS-CoV-2/FLU/RSV plus assay is intended as an aid in the diagnosis of influenza from Nasopharyngeal swab specimens and should not be used as a sole basis for treatment. Nasal washings and aspirates are unacceptable for Xpert Xpress SARS-CoV-2/FLU/RSV testing.  Fact Sheet for Patients: EntrepreneurPulse.com.au  Fact Sheet for Healthcare Providers: IncredibleEmployment.be  This test is not yet approved or cleared by the [place] FDA and has been authorized for detection and/or diagnosis of SARS-CoV-2  by FDA under an Emergency Use Authorization (EUA). This EUA will remain in effect (meaning this test can be  used) for the duration of the COVID-19 declaration under Section 564(b)(1) of the Act, 21 U.S.C. section 360bbb-3(b)(1), unless the authorization is terminated or revoked.  Performed at [facility], [place] [address] [place], [place] [address]   Surgical pcr screen     Status: Abnormal   Collection Time: 04/27/21 12:33 PM   Specimen: Nasal Mucosa; Nasal Swab  Result Value Ref Range Status   MRSA, PCR POSITIVE (A) NEGATIVE Final    Comment: RESULT CALLED TO, READ BACK BY AND VERIFIED WITH:  ASHLEY D 1449 ADL    Staphylococcus aureus POSITIVE (A) NEGATIVE Final    Comment: (NOTE) The Xpert SA Assay (FDA approved for NASAL specimens in patients 1 years of age and older), is one component of a comprehensive surveillance program. It is not intended to diagnose infection nor to guide or monitor treatment. Performed at [facility], [place] [address], [place], [place] [address]      Medications:    acetaminophen  1,000 mg Oral Q8H   aspirin EC  81 mg Oral BID   busPIRone  5 mg Oral BID   cholecalciferol  1,000 Units Oral Daily   docusate sodium  100 mg Oral BID   donepezil  10 mg Oral QHS   gabapentin  100 mg Oral BID   memantine  5 mg Oral Daily   metoprolol tartrate  2.5 mg Intravenous Q6H   mometasone-formoterol  2 puff Inhalation BID   multivitamin with minerals  1 tablet Oral Daily   pantoprazole  40 mg Oral BID   QUEtiapine  50 mg Oral QHS   Continuous Infusions:  methocarbamol (ROBAXIN) IV        LOS: 3 days   Charlynne Cousins  [facility]  04/29/2021, 8:57 AM

## 2021-04-29 NOTE — TOC Initial Note (Signed)
Transition of Care [facility]) - Initial/Assessment Note    Patient Details  Name: Sara Fox MRN: [id number] Date of Birth: 05/13/1941  Transition of Care [facility]) CM/SW Contact:    Coralee Pesa, [place] Phone Number: 04/29/2021, 12:07 PM  Clinical Narrative:                  CSW noted that pt is disoriented and reached out to son [name] to discuss SNF recommendation. Son is agreeable and noted that pt has previously been to [place] about 4 months ago, he is agreeable to her returning. He would like for pt to stay in [place]. Pt has had two covid vaccines, the plan is for her to return home with son. He has [place] and medical care providers that come to the house. CSW will fax pt out and TOC will continue to follow for DC needs.  Expected Discharge Plan: Skilled Nursing Facility Barriers to Discharge: Continued Medical Work up, SNF Pending bed offer, Insurance Authorization   Patient Goals and CMS Choice Patient states their goals for this hospitalization and ongoing recovery are:: Pt is disoriented and unable to participate in DC planning at this time. CMS Medicare.gov Compare Post Acute Care list provided to:: Patient Represenative (must comment) Choice offered to / list presented to : Adult Children [name])  Expected Discharge Plan and Services Expected Discharge Plan: [place] In-house Referral: Clinical Social Work   Post Acute Care Choice: [place] arrangements for the past 2 months: [place]                                      Prior Living Arrangements/Services Living arrangements for the past 2 months: Single Family Home Lives with:: Adult Children Patient language and need for interpreter reviewed:: Yes Do you feel safe going back to the place where you live?: Yes      Need for Family Participation in Patient Care: Yes (Comment) Care giver support system in place?: Yes (comment)   Criminal Activity/Legal  Involvement Pertinent to Current Situation/Hospitalization: No - Comment as needed  Activities of Daily Living      Permission Sought/Granted Permission sought to share information with : Family Supports Permission granted to share information with : Yes, Verbal Permission Granted  Share Information with NAME: [name]     Permission granted to share info w Relationship: Son  Permission granted to share info w Contact Information: [phone]  Emotional Assessment Appearance:: Appears stated age Attitude/Demeanor/Rapport: Unable to Assess Affect (typically observed): Unable to Assess Orientation: : Oriented to Self Alcohol / Substance Use: Not Applicable Psych Involvement: No (comment)  Admission diagnosis:  Knee pain, left [M25.562] Closed fracture of left hip, initial encounter ([place]) [S72.002A] Fall, initial encounter [W19.XXXA] Closed left hip fracture, initial encounter ([place]) [S72.002A] Compression fracture of body of thoracic vertebra ([place]) [S22.000A] Patient Active Problem List   Diagnosis Date Noted   Closed left hip fracture, initial encounter ([place]) 04/26/2021   Gait disturbance 02/27/2021   GERD (gastroesophageal reflux disease) 02/27/2021   Ambulatory dysfunction 02/27/2021   Transaminitis 02/27/2021   Fall (on)(from) incline, initial encounter 11/23/2020   Macrocytosis without anemia 11/22/2020   Unwitnessed fall 11/22/2020   Late onset Alzheimer's dementia with behavioral disturbance ([place]) 11/18/2020   Visual hallucination 11/18/2020   Sundowning 11/18/2020   Non-small cell carcinoma of right lung, stage 1 ([place]) 06/08/2020   Pulmonary  nodule 1 cm or greater in diameter 05/21/2020   Atherosclerotic heart disease of native coronary artery with other forms of angina pectoris ([place]) 04/22/2020   Memory loss 02/18/2019   Vitamin B12 deficiency 10/23/2018   Stage 3 chronic kidney disease ([place]) 06/03/2018   Chronic anemia 06/03/2018   Hypercholesterolemia  06/03/2018   Malnutrition of moderate degree 07/12/2017   Hypoalbuminemia 07/11/2017   Elevated LFTs 07/11/2017   Snoring 12/21/2016   Peripheral musculoskeletal gait disorder 02/05/2015   Lumbar post-laminectomy syndrome 02/05/2015   CAD in native artery 10/16/2014   NSTEMI (non-ST elevated myocardial infarction) ([place]) 09/13/2014   Cardiomyopathy, ischemic 72/90/2111   Chronic systolic heart failure ([place])    Tobacco abuse 09/12/2014   Dyslipidemia 09/12/2014   Normocytic anemia 09/12/2014   Chronic diastolic heart failure, NYHA class 1 ([place]) 09/12/2014   Postlaminectomy syndrome, cervical region 08/01/2013   Chronic midline low back pain with left-sided sciatica 08/01/2013   Shoulder joint contracture 08/01/2013   HTN (hypertension) 03/03/2013   Abnormal genetic test 03/03/2013   PCP:  [facility] Pharmacy:   [id number] [place], [place] [address] [place] [address] Phone: [phone] Fax: [phone]  [facility] - [place], [place] - [address] [place] [address] Phone: [phone] Fax: [phone]     Social Determinants of Health (SDOH) Interventions    Readmission Risk Interventions No flowsheet data found.

## 2021-04-29 NOTE — NC FL2 (Signed)
[place] MEDICAID FL2 LEVEL OF CARE SCREENING TOOL     IDENTIFICATION  Patient Name: Sara Fox Birthdate: Jun 06, 1940 Sex: female Admission Date (Current Location): 04/26/2021  [facility] [place] Number:  Herbalist and Address:  [facility], [place] [address] [place],  [address]      Provider Number: [id number]  Attending Physician Name and Address:  Charlynne Cousins, MD  Relative Name and Phone Number:  [name], [phone]    Current Level of Care: Hospital Recommended Level of Care: [place] Prior Approval Number:    Date Approved/Denied:   PASRR Number: [id number]  Discharge Plan: SNF    Current Diagnoses: Patient Active Problem List   Diagnosis Date Noted   Closed left hip fracture, initial encounter ([place]) 04/26/2021   Gait disturbance 02/27/2021   GERD (gastroesophageal reflux disease) 02/27/2021   Ambulatory dysfunction 02/27/2021   Transaminitis 02/27/2021   Fall (on)(from) incline, initial encounter 11/23/2020   Macrocytosis without anemia 11/22/2020   Unwitnessed fall 11/22/2020   Late onset Alzheimer's dementia with behavioral disturbance ([place]) 11/18/2020   Visual hallucination 11/18/2020   Sundowning 11/18/2020   Non-small cell carcinoma of right lung, stage 1 ([place]) 06/08/2020   Pulmonary nodule 1 cm or greater in diameter 05/21/2020   Atherosclerotic heart disease of native coronary artery with other forms of angina pectoris ([place]) 04/22/2020   Memory loss 02/18/2019   Vitamin B12 deficiency 10/23/2018   Stage 3 chronic kidney disease ([place]) 06/03/2018   Chronic anemia 06/03/2018   Hypercholesterolemia 06/03/2018   Malnutrition of moderate degree 07/12/2017   Hypoalbuminemia 07/11/2017   Elevated LFTs 07/11/2017   Snoring 12/21/2016   Peripheral musculoskeletal gait disorder 02/05/2015   Lumbar post-laminectomy syndrome 02/05/2015   CAD in native artery 10/16/2014   NSTEMI  (non-ST elevated myocardial infarction) ([place]) 09/13/2014   Cardiomyopathy, ischemic 44/07/4740   Chronic systolic heart failure ([place])    Tobacco abuse 09/12/2014   Dyslipidemia 09/12/2014   Normocytic anemia 09/12/2014   Chronic diastolic heart failure, NYHA class 1 ([place]) 09/12/2014   Postlaminectomy syndrome, cervical region 08/01/2013   Chronic midline low back pain with left-sided sciatica 08/01/2013   Shoulder joint contracture 08/01/2013   HTN (hypertension) 03/03/2013   Abnormal genetic test 03/03/2013    Orientation RESPIRATION BLADDER Height & Weight     Self  Normal Incontinent, External catheter Weight: 130 lb (59 kg) Height:  5\' 4"  (162.6 cm)  BEHAVIORAL SYMPTOMS/MOOD NEUROLOGICAL BOWEL NUTRITION STATUS      Incontinent Diet (See DC summary)  AMBULATORY STATUS COMMUNICATION OF NEEDS Skin   Extensive Assist Verbally Surgical wounds (L hip incision)                       Personal Care Assistance Level of Assistance  Bathing, Feeding, Dressing Bathing Assistance: Maximum assistance Feeding assistance: Limited assistance Dressing Assistance: Maximum assistance     Functional Limitations Info  Sight, Hearing, Speech Sight Info: Impaired Hearing Info: Adequate Speech Info: Adequate    SPECIAL CARE FACTORS FREQUENCY  PT (By licensed PT), OT (By licensed OT)     PT Frequency: 5x week OT Frequency: 5x week            Contractures Contractures Info: Not present    Additional Factors Info  Allergies, Code Status, Psychotropic Code Status Info: DNR Allergies Info: Buprenorphine Hcl   Morphine And Related   Celebrex (Celecoxib)   Vioxx (Rofecoxib)   Codeine Psychotropic  Info: Aricept, Buspar, Seroquel         Current Medications (04/29/2021):  This is the current hospital active medication list Current Facility-Administered Medications  Medication Dose Route Frequency Provider Last Rate Last Admin   acetaminophen (TYLENOL) tablet 1,000 mg  1,000 mg  Oral Q8H McClung, Sarah A, PA-C   1,000 mg at 04/29/21 3875   aspirin EC tablet 81 mg  81 mg Oral BID Corinne Ports, PA-C   81 mg at 04/29/21 6433   bisacodyl (DULCOLAX) EC tablet 5 mg  5 mg Oral Daily PRN Corinne Ports, PA-C       busPIRone (BUSPAR) tablet 5 mg  5 mg Oral BID Corinne Ports, PA-C   5 mg at 04/29/21 2951   cholecalciferol (VITAMIN D3) tablet 1,000 Units  1,000 Units Oral Daily Corinne Ports, PA-C   1,000 Units at 04/29/21 8841   docusate sodium (COLACE) capsule 100 mg  100 mg Oral BID Corinne Ports, PA-C   100 mg at 04/28/21 2208   donepezil (ARICEPT) tablet 10 mg  10 mg Oral QHS Corinne Ports, PA-C   10 mg at 04/28/21 2208   furosemide (LASIX) tablet 20 mg  20 mg Oral Once per day on Mon Tue Wed Thu Fri Charlynne Cousins, MD   20 mg at 04/29/21 0936   gabapentin (NEURONTIN) capsule 100 mg  100 mg Oral BID Corinne Ports, PA-C   100 mg at 04/29/21 0934   ipratropium-albuterol (DUONEB) 0.5-2.5 (3) MG/3ML nebulizer solution 3 mL  3 mL Nebulization Q4H PRN Corinne Ports, PA-C       memantine (NAMENDA) tablet 5 mg  5 mg Oral Daily Corinne Ports, PA-C   5 mg at 04/29/21 6606   methocarbamol (ROBAXIN) tablet 500 mg  500 mg Oral Q6H PRN Corinne Ports, PA-C       Or   methocarbamol (ROBAXIN) 500 mg in dextrose 5 % 50 mL IVPB  500 mg Intravenous Q6H PRN Thereasa Solo, Sarah A, PA-C       metoprolol tartrate (LOPRESSOR) injection 2.5 mg  2.5 mg Intravenous Q6H McClung, Sarah A, PA-C   2.5 mg at 04/29/21 0557   mometasone-formoterol (DULERA) 200-5 MCG/ACT inhaler 2 puff  2 puff Inhalation BID Corinne Ports, PA-C   2 puff at 04/29/21 3016   multivitamin with minerals tablet 1 tablet  1 tablet Oral Daily Corinne Ports, PA-C   1 tablet at 04/29/21 0934   ondansetron (ZOFRAN) tablet 4 mg  4 mg Oral Q6H PRN Corinne Ports, PA-C       Or   ondansetron (ZOFRAN) injection 4 mg  4 mg Intravenous Q6H PRN Corinne Ports, PA-C       pantoprazole (PROTONIX) EC tablet  40 mg  40 mg Oral BID Rushie Nyhan A, PA-C   40 mg at 04/29/21 0109   polyethylene glycol (MIRALAX / GLYCOLAX) packet 17 g  17 g Oral Daily PRN Corinne Ports, PA-C       QUEtiapine (SEROQUEL) tablet 50 mg  50 mg Oral QHS Rushie Nyhan A, PA-C   50 mg at 04/28/21 2209     Discharge Medications: Please see discharge summary for a list of discharge medications.  Relevant Imaging Results:  Relevant Lab Results:   Additional Information ss# [id number]. COVID vaccinated (09/15/19, 08/30/19)  Sara Fox, [place]

## 2021-04-29 NOTE — Plan of Care (Signed)

## 2021-04-29 NOTE — Progress Notes (Signed)
Orthopaedic Trauma Progress Note  SUBJECTIVE:Doing okay today, pain in hip manageable currently. No chest pain. No SOB. No nausea/vomiting. No other complaints.   OBJECTIVE:  Vitals:   04/29/21 0936 04/29/21 1233  BP: 118/74 (!) 96/59  Pulse:    Resp:    Temp:    SpO2:  100%    General: Laying in bed comfortably, no acute distress Respiratory: No increased work of breathing.  Left lower extremity: Mepilex dressings removed, incisions are clean, dry, intact.  No significant tenderness over the hip or throughout the thigh.  Endorses sensation to light touch through extremity.  Compartments soft and compressible. +DP pulse  IMAGING: Stable post op imaging.   LABS:  Results for orders placed or performed during the hospital encounter of 04/26/21 (from the past 24 hour(s))  CBC     Status: Abnormal   Collection Time: 04/29/21 12:52 AM  Result Value Ref Range   WBC 5.6 4.0 - 10.5 K/uL   RBC 2.41 (L) 3.87 - 5.11 MIL/uL   Hemoglobin 7.2 (L) 12.0 - 15.0 g/dL   HCT 23.3 (L) 36.0 - 46.0 %   MCV 96.7 80.0 - 100.0 fL   MCH 29.9 26.0 - 34.0 pg   MCHC 30.9 30.0 - 36.0 g/dL   RDW 14.5 11.5 - 15.5 %   Platelets 193 150 - 400 K/uL   nRBC 0.0 0.0 - 0.2 %  Basic metabolic panel     Status: Abnormal   Collection Time: 04/29/21 12:52 AM  Result Value Ref Range   Sodium 136 135 - 145 mmol/L   Potassium 3.8 3.5 - 5.1 mmol/L   Chloride 110 98 - 111 mmol/L   CO2 20 (L) 22 - 32 mmol/L   Glucose, Bld 108 (H) 70 - 99 mg/dL   BUN 24 (H) 8 - 23 mg/dL   Creatinine, Ser 1.24 (H) 0.44 - 1.00 mg/dL   Calcium 8.1 (L) 8.9 - 10.3 mg/dL   GFR, Estimated 44 (L) >60 mL/min   Anion gap 6 5 - 15    ASSESSMENT: Sara Fox is a 80 y.o. female, 2 Days Post-Op s/p INTRAMEDULLARY NAIL LEFT INTERTROCHANTRIC FEMUR FRACTURE  CV/Blood loss: ABLA, Hemoglobin 7.2 this morning. Continue to monitor CBC  PLAN: Weightbearing: WBAT LLE Range of motion: Okay for unrestricted hip and knee ROM Incisional and dressing  care: ok to leave incisions open to air Showering: Okay to begin showering with assistance 04/30/2021.  Incisions may get wet at this point Orthopedic device(s): None  Pain management:  1. Tylenol 1000 mg q 8 hours scheduled 2. Robaxin 500 mg q 6 hours PRN 3. Neurontin 100 mg BID VTE prophylaxis: Aspirin 81 mg, SCDs ID:  Ancef 2gm post op completed Foley/Lines:  No foley, KVO IVFs Dispo: Therapies as tolerated, PT/OT recommending SNF. Okay for discharge from ortho standpoint once cleared by medicine team and therapies. Have signed and placed rx for Tylenol, Robaxin, and Aspirin 81 mg in chart Follow - up plan: 2 weeks after discharge for repeat x-rays and wound check  Contact information:  Katha Hamming MD, Patrecia Pace PA-C. After hours and holidays please check [email or web address] for group call information for [facility]  Gwinda Passe, PA-C [phone] (office) [email or web address]

## 2021-04-29 NOTE — Discharge Instructions (Signed)
Orthopaedic Trauma Service Discharge Instructions   General Discharge Instructions  WEIGHT BEARING STATUS:weightbearing as tolerated  RANGE OF MOTION/ACTIVITY: ok for unrestricted hip motion  Wound Care: Incisions can be left open to air if there is no drainage. If incision continues to have drainage, follow wound care instructions below. Okay to shower if no drainage from incisions.  DVT/PE prophylaxis: Aspirin  Diet: as you were eating previously.  Can use over the counter stool softeners and bowel preparations, such as Miralax, to help with bowel movements.  Narcotics can be constipating.  Be sure to drink plenty of fluids  PAIN MEDICATION USE AND EXPECTATIONS  You have likely been given narcotic medications to help control your pain.  After a traumatic event that results in an fracture (broken bone) with or without surgery, it is ok to use narcotic pain medications to help control one's pain.  We understand that everyone responds to pain differently and each individual patient will be evaluated on a regular basis for the continued need for narcotic medications. Ideally, narcotic medication use should last no more than 6-8 weeks (coinciding with fracture healing).   As a patient it is your responsibility as well to monitor narcotic medication use and report the amount and frequency you use these medications when you come to your office visit.   We would also advise that if you are using narcotic medications, you should take a dose prior to therapy to maximize you participation.  IF YOU ARE ON NARCOTIC MEDICATIONS IT IS NOT PERMISSIBLE TO OPERATE A MOTOR VEHICLE (MOTORCYCLE/CAR/TRUCK/MOPED) OR HEAVY MACHINERY DO NOT MIX NARCOTICS WITH OTHER CNS (CENTRAL NERVOUS SYSTEM) DEPRESSANTS SUCH AS ALCOHOL   STOP SMOKING OR USING NICOTINE PRODUCTS!!!!  As discussed nicotine severely impairs your body's ability to heal surgical and traumatic wounds but also impairs bone healing.  Wounds and bone  heal by forming microscopic blood vessels (angiogenesis) and nicotine is a vasoconstrictor (essentially, shrinks blood vessels).  Therefore, if vasoconstriction occurs to these microscopic blood vessels they essentially disappear and are unable to deliver necessary nutrients to the healing tissue.  This is one modifiable factor that you can do to dramatically increase your chances of healing your injury.    (This means no smoking, no nicotine gum, patches, etc)  DO NOT USE NONSTEROIDAL ANTI-INFLAMMATORY DRUGS (NSAID'S)  Using products such as Advil (ibuprofen), Aleve (naproxen), Motrin (ibuprofen) for additional pain control during fracture healing can delay and/or prevent the healing response.  If you would like to take over the counter (OTC) medication, Tylenol (acetaminophen) is ok.  However, some narcotic medications that are given for pain control contain acetaminophen as well. Therefore, you should not exceed more than 4000 mg of tylenol in a day if you do not have liver disease.  Also note that there are may OTC medicines, such as cold medicines and allergy medicines that my contain tylenol as well.  If you have any questions about medications and/or interactions please ask your doctor/PA or your pharmacist.      ICE AND ELEVATE INJURED/OPERATIVE EXTREMITY  Using ice and elevating the injured extremity above your heart can help with swelling and pain control.  Icing in a pulsatile fashion, such as 20 minutes on and 20 minutes off, can be followed.    Do not place ice directly on skin. Make sure there is a barrier between to skin and the ice pack.    Using frozen items such as frozen peas works well as the conform nicely to the  are that needs to be iced.  USE AN ACE WRAP OR TED HOSE FOR SWELLING CONTROL  In addition to icing and elevation, Ace wraps or TED hose are used to help limit and resolve swelling.  It is recommended to use Ace wraps or TED hose until you are informed to stop.    When  using Ace Wraps start the wrapping distally (farthest away from the body) and wrap proximally (closer to the body)   Example: If you had surgery on your leg or thing and you do not have a splint on, start the ace wrap at the toes and work your way up to the thigh        If you had surgery on your upper extremity and do not have a splint on, start the ace wrap at your fingers and work your way up to the upper arm    [place]: [phone]   VISIT OUR WEBSITE FOR ADDITIONAL INFORMATION: [email or web address]     Discharge Wound Care Instructions  Do NOT apply any ointments, solutions or lotions to pin sites or surgical wounds.  These prevent needed drainage and even though solutions like hydrogen peroxide kill bacteria, they also damage cells lining the pin sites that help fight infection.  Applying lotions or ointments can keep the wounds moist and can cause them to breakdown and open up as well. This can increase the risk for infection. When in doubt call the office.   If any drainage is noted, use foam dressing  Once the incision is completely dry and without drainage, it may be left open to air out.  Showering may begin 36-48 hours later.  Cleaning gently with soap and water

## 2021-04-30 MED ORDER — CHLORHEXIDINE GLUCONATE CLOTH 2 % EX PADS
6.0000 | MEDICATED_PAD | Freq: Every day | CUTANEOUS | Status: DC
  Administered 2021-04-30 – 2021-05-02 (×3): 6 via TOPICAL

## 2021-04-30 MED ORDER — MUPIROCIN 2 % EX OINT
1.0000 "application " | TOPICAL_OINTMENT | Freq: Two times a day (BID) | CUTANEOUS | Status: DC
  Administered 2021-04-30 – 2021-05-02 (×5): 1 via NASAL
  Filled 2021-04-30: qty 22

## 2021-04-30 NOTE — Progress Notes (Signed)
MD notified of soft bp 114/57. Orders received to continue with next scheduled administration of Metoprolol.

## 2021-04-30 NOTE — Progress Notes (Signed)
[facility] PROGRESS NOTE    Progress Note  Sara Fox  GUY:[id number] DOB: Jan 17, 1941 DOA: 04/26/2021 PCP: [facility]     Brief Narrative:   Sara Fox is an 80 y.o. female past medical history of Alzheimer's dementia, ambulatory dysfunction, combined systolic and diastolic heart failure, COPD, stage IIIa chronic kidney disease, stage IIA NSLC lsince 2021 who underwent curative SBRT  with a suspicious small lower right paratracheal lymph node, recently discharged from the hospital, found down by EMS by her son, he relates that she has been complaining of no symptoms.  In the ED her hemoglobin was 9 creatinine 1.3, SARS-CoV-2 and influenza were negative CT of the head no evidence of acute intracranial abnormality, CT of the C-spine showed T2 and T3 compression fracture, left hip x-ray showed left acute intertrochanteric hip fracture with mild amputation, acute and subacute fracture throughout the superior and inferior pubic rami.  Orthopedic surgery was consulted who recommended surgical intervention  Assessment/Plan:   Unwitnessed fall leading to closed left hip fracture, initial encounter [facility]): Orthopedic surgery was consulted who recommended ORIF on 04/27/2021. Patient has only required Tylenol for pain discontinue narcotics, continue Robaxin.   Continue MiraLAX tolerating her diet. PT consulted, needs skilled nursing facility.  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 Continue tylenol for pain. Physical therapy evaluated the patient will need SNF  History of lung cancer status post therapy: Need outpatient follow-up with oncology.  Chronic combined systolic and diastolic heart failure: 2D echo in August 2022 showed an EF of 55% no wall motion abnormalities. Allow a diet resume her Lasix.  Chronic COPD/obstructive sleep  apnea: Continue inhalers.  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  Chronic kidney disease stage IIIa: Creatinine appears to be at baseline.  Normocytic anemia: Hemoglobin has remained relatively stable, her hemoglobin is 8.1  Ethics/goals of care: Had a long discussion with the son who is the next of kin has been taking care of her for the last 10 years she currently lives with him.  They met with palliative care they would like to go to skilled nursing facility  Essential hypertension: Resume home dose of beta-blocker, continue IV metoprolol every 6 hours.  Sacral decubitus ulcer stage III present on admission: RN Pressure Injury Documentation: Pressure Injury 07/18/17 Stage II -  Partial thickness loss of dermis presenting as a shallow open ulcer with a red, pink wound bed without slough. (Active)  07/18/17 0730  Location: Sacrum  Location Orientation:   Staging: Stage II -  Partial thickness loss of dermis presenting as a shallow open ulcer with a red, pink wound bed without slough.  Wound Description (Comments):   Present on Admission: Yes     DVT prophylaxis: lovenox Family Communication:none Status is: Inpatient  Remains inpatient appropriate because: Mechanical fall leading to a left hip fracture with superior and inferior pubic ramus fracture needing surgical intervention.     Code Status:     Code Status Orders  (From admission, onward)           Start     Ordered   04/26/21 1750  Do not attempt resuscitation (DNR)  Continuous       Question Answer Comment  In the event of cardiac or respiratory ARREST Do not call a code blue   In  the event of cardiac or respiratory ARREST Do not perform Intubation, CPR, defibrillation or ACLS   In the event of cardiac or respiratory ARREST Use medication by any route, position, wound care, and other measures to  relive pain and suffering. May use oxygen, suction and manual treatment of airway obstruction as needed for comfort.      04/26/21 1749           Code Status History     Date Active Date Inactive Code Status Order ID Comments User Context   02/27/2021 1447 03/05/2021 1822 DNR [id number]  Orma Flaming, MD ED   11/22/2020 1906 11/27/2020 1711 DNR [id number]  Orma Flaming, MD ED   07/11/2017 0002 07/20/2017 2327 Full Code [id number]  Norval Morton, MD ED   09/14/2014 1337 09/15/2014 1546 Full Code [id number]  Leonie Man, MD Inpatient   09/12/2014 0849 09/14/2014 1337 Full Code [id number]  [name], NP Inpatient         IV Access:   Peripheral IV   Procedures and diagnostic studies:   No results found.   Medical Consultants:   None.   Subjective:    Sara Fox awake this morning no complaints.  Objective:    Vitals:   04/30/21 0039 04/30/21 0523 04/30/21 0736 04/30/21 0759  BP: 124/61 130/67 (!) 114/57   Pulse: 79 73 72   Resp:  16 15   Temp: 98 F (36.7 C) 97.8 F (36.6 C) (!) 97.5 F (36.4 C)   TempSrc: Oral Oral Oral   SpO2: 96% 98% 99% 95%  Weight:      Height:       SpO2: 95 % O2 Flow Rate (L/min): 2 L/min   Intake/Output Summary (Last 24 hours) at 04/30/2021 0856 Last data filed at 04/30/2021 0546 Gross per 24 hour  Intake 720 ml  Output 1750 ml  Net -1030 ml    Filed Weights   04/27/21 1235 04/27/21 1602  Weight: 59 kg 59 kg    Exam: General exam: In no acute distress. Respiratory system: Good air movement and clear to auscultation. Cardiovascular system: S1 & S2 heard, RRR. No JVD. Gastrointestinal system: Abdomen is nondistended, soft and nontender.  Extremities: No pedal edema. Skin: No rashes, lesions or ulcers   Data Reviewed:    Labs: Basic Metabolic Panel: Recent Labs  Lab 04/26/21 1319 04/27/21 0323 04/28/21 0158 04/29/21 0052  NA 142 138 137 136  K 4.3 4.6 4.5 3.8  CL 113* 111 109 110  CO2 23 22  20* 20*  GLUCOSE 120* 131* 129* 108*  BUN 40* 29* 25* 24*  CREATININE 1.23* 1.01* 1.15* 1.24*  CALCIUM 8.9 8.5* 8.3* 8.1*  MG 2.2 1.9  --   --   PHOS  --  3.1  --   --     GFR Estimated Creatinine Clearance: 30.7 mL/min (A) (by C-G formula based on SCr of 1.24 mg/dL (H)). Liver Function Tests: Recent Labs  Lab 04/26/21 1319 04/27/21 0323  AST 22  --   ALT 19  --   ALKPHOS 168*  --   BILITOT 0.5  --   PROT 6.6  --   ALBUMIN 3.0* 2.4*    No results for input(s): LIPASE, AMYLASE in the last 168 hours. No results for input(s): AMMONIA in the last 168 hours. Coagulation profile Recent Labs  Lab 04/27/21 0323  INR 1.1    COVID-19 Labs  No results for input(s): DDIMER, FERRITIN, LDH, CRP in the  last 72 hours.   Lab Results  Component Value Date   SARSCOV2NAA NEGATIVE 04/26/2021   SARSCOV2NAA NEGATIVE 03/04/2021   [place] NEGATIVE 02/27/2021   [place] NEGATIVE 11/26/2020    CBC: Recent Labs  Lab 04/26/21 1319 04/27/21 0433 04/28/21 0158 04/29/21 0052  WBC 8.3 5.5 6.4 5.6  NEUTROABS 6.3  --   --   --   HGB 9.3* 8.2* 8.1* 7.2*  HCT 32.1* 27.0* 27.5* 23.3*  MCV 99.1 97.5 97.5 96.7  PLT 264 203 190 193    Cardiac Enzymes: Recent Labs  Lab 04/26/21 1319 04/27/21 0323 04/28/21 0158  CKTOTAL 110 140 137    BNP (last 3 results) No results for input(s): PROBNP in the last 8760 hours. CBG: Recent Labs  Lab 04/26/21 1311  GLUCAP 98    D-Dimer: No results for input(s): DDIMER in the last 72 hours. Hgb A1c: No results for input(s): HGBA1C in the last 72 hours. Lipid Profile: No results for input(s): CHOL, HDL, LDLCALC, TRIG, CHOLHDL, LDLDIRECT in the last 72 hours. Thyroid function studies: No results for input(s): TSH, T4TOTAL, T3FREE, THYROIDAB in the last 72 hours.  Invalid input(s): FREET3 Anemia work up: No results for input(s): VITAMINB12, FOLATE, FERRITIN, TIBC, IRON, RETICCTPCT in the last 72 hours.  Sepsis Labs: Recent Labs   Lab 04/26/21 1300 04/26/21 1319 04/27/21 0433 04/28/21 0158 04/29/21 0052  WBC  --  8.3 5.5 6.4 5.6  LATICACIDVEN 1.0  --   --   --   --     Microbiology Recent Results (from the past 240 hour(s))  Blood culture (routine x 2)     Status: Abnormal   Collection Time: 04/26/21  1:19 PM   Specimen: BLOOD  Result Value Ref Range Status   Specimen Description BLOOD RIGHT ANTECUBITAL  Final   Special Requests   Final    BOTTLES DRAWN AEROBIC AND ANAEROBIC Blood Culture results may not be optimal due to an excessive volume of blood received in culture bottles   Culture  Setup Time   Final    GRAM POSITIVE RODS ANAEROBIC BOTTLE ONLY CRITICAL RESULT CALLED TO, READ BACK BY AND VERIFIED WITH: V [email or web address]  04/17/21 mk    Culture (A)  Final    BACILLUS SPECIES Standardized susceptibility testing for this organism is not available. Performed at [facility], [place] [address] [place], [place] [address]    Report Status 04/29/2021 FINAL  Final  Blood culture (routine x 2)     Status: None (Preliminary result)   Collection Time: 04/26/21  1:19 PM   Specimen: BLOOD  Result Value Ref Range Status   Specimen Description BLOOD BLOOD RIGHT HAND  Final   Special Requests   Final    BOTTLES DRAWN AEROBIC AND ANAEROBIC Blood Culture adequate volume   Culture   Final    NO GROWTH 3 DAYS Performed at [facility], [place] [address], [place], [place] [address]    Report Status PENDING  Incomplete  Resp Panel by RT-PCR (Flu A&B, Covid) Nasopharyngeal Swab     Status: None   Collection Time: 04/26/21  1:34 PM   Specimen: Nasopharyngeal Swab; Nasopharyngeal(NP) swabs in vial transport medium  Result Value Ref Range Status   SARS Coronavirus 2 by RT PCR NEGATIVE NEGATIVE Final    Comment: (NOTE) SARS-CoV-2 target nucleic acids are NOT DETECTED.  The SARS-CoV-2 RNA is generally detectable in upper respiratory specimens during the acute phase of infection. The lowest concentration  of SARS-CoV-2 viral  copies this assay can detect is 138 copies/mL. A negative result does not preclude SARS-Cov-2 infection and should not be used as the sole basis for treatment or other patient management decisions. A negative result may occur with  improper specimen collection/handling, submission of specimen other than nasopharyngeal swab, presence of viral mutation(s) within the areas targeted by this assay, and inadequate number of viral copies(<138 copies/mL). A negative result must be combined with clinical observations, patient history, and epidemiological information. The expected result is Negative.  Fact Sheet for Patients:  EntrepreneurPulse.com.au  Fact Sheet for Healthcare Providers:  IncredibleEmployment.be  This test is no t yet approved or cleared by the [place] FDA and  has been authorized for detection and/or diagnosis of SARS-CoV-2 by FDA under an Emergency Use Authorization (EUA). This EUA will remain  in effect (meaning this test can be used) for the duration of the COVID-19 declaration under Section 564(b)(1) of the Act, 21 U.S.C.section 360bbb-3(b)(1), unless the authorization is terminated  or revoked sooner.       Influenza A by PCR NEGATIVE NEGATIVE Final   Influenza B by PCR NEGATIVE NEGATIVE Final    Comment: (NOTE) The Xpert Xpress SARS-CoV-2/FLU/RSV plus assay is intended as an aid in the diagnosis of influenza from Nasopharyngeal swab specimens and should not be used as a sole basis for treatment. Nasal washings and aspirates are unacceptable for Xpert Xpress SARS-CoV-2/FLU/RSV testing.  Fact Sheet for Patients: EntrepreneurPulse.com.au  Fact Sheet for Healthcare Providers: IncredibleEmployment.be  This test is not yet approved or cleared by the [place] FDA and has been authorized for detection and/or diagnosis of SARS-CoV-2 by FDA under an Emergency Use  Authorization (EUA). This EUA will remain in effect (meaning this test can be used) for the duration of the COVID-19 declaration under Section 564(b)(1) of the Act, 21 U.S.C. section 360bbb-3(b)(1), unless the authorization is terminated or revoked.  Performed at [facility], [place] [address] [place], [place] [address]   Surgical pcr screen     Status: Abnormal   Collection Time: 04/27/21 12:33 PM   Specimen: Nasal Mucosa; Nasal Swab  Result Value Ref Range Status   MRSA, PCR POSITIVE (A) NEGATIVE Final    Comment: RESULT CALLED TO, READ BACK BY AND VERIFIED WITH:  ASHLEY D 1449 ADL    Staphylococcus aureus POSITIVE (A) NEGATIVE Final    Comment: (NOTE) The Xpert SA Assay (FDA approved for NASAL specimens in patients 66 years of age and older), is one component of a comprehensive surveillance program. It is not intended to diagnose infection nor to guide or monitor treatment. Performed at [facility], [place] [address], [place], [place] [address]      Medications:    acetaminophen  1,000 mg Oral Q8H   aspirin EC  81 mg Oral BID   busPIRone  5 mg Oral BID   Chlorhexidine Gluconate Cloth  6 each Topical Q0600   cholecalciferol  1,000 Units Oral Daily   docusate sodium  100 mg Oral BID   donepezil  10 mg Oral QHS   furosemide  20 mg Oral Once per day on Mon Tue Wed Thu Fri   gabapentin  100 mg Oral BID   memantine  5 mg Oral Daily   metoprolol tartrate  2.5 mg Intravenous Q6H   mometasone-formoterol  2 puff Inhalation BID   multivitamin with minerals  1 tablet Oral Daily   mupirocin ointment  1 application Nasal BID   pantoprazole  40  mg Oral BID   QUEtiapine  50 mg Oral QHS   Continuous Infusions:  methocarbamol (ROBAXIN) IV        LOS: 4 days   Charlynne Cousins  [facility]  04/30/2021, 8:56 AM

## 2021-04-30 NOTE — TOC Progression Note (Signed)
Transition of Care [facility]) - Progression Note    Patient Details  Name: AGAPE HARDIMAN MRN: [id number] Date of Birth: [birth year]  [facility] CM/SW Contact  Emeterio Reeve, [place] Phone Number: 04/30/2021, 2:01 PM  Clinical Narrative:     CSW called pts son Durenda Hurt to give bed offers. [name] stated pt has been to [place] and would like for her to go back. CSW sent a text message to admissions coordinator to review pt in the hub. CSW will continue to monitor.   Expected Discharge Plan: [place] Barriers to Discharge: Continued Medical Work up, SNF Pending bed offer, Ship broker  Expected Discharge Plan and Services Expected Discharge Plan: [place] In-house Referral: Clinical Social Work   Post Acute Care Choice: [place] Living arrangements for the past 2 months: Single Family Home                                       Social Determinants of Health (SDOH) Interventions    Readmission Risk Interventions No flowsheet data found.  Emeterio Reeve, LCSW Clinical Social Worker

## 2021-05-01 LAB — CULTURE, BLOOD (ROUTINE X 2)
Culture: NO GROWTH
Special Requests: ADEQUATE

## 2021-05-01 LAB — HEMOGLOBIN AND HEMATOCRIT, BLOOD
HCT: 31.2 % — ABNORMAL LOW (ref 36.0–46.0)
Hemoglobin: 9.5 g/dL — ABNORMAL LOW (ref 12.0–15.0)

## 2021-05-01 LAB — PREPARE RBC (CROSSMATCH)

## 2021-05-01 MED ORDER — SODIUM CHLORIDE 0.9% IV SOLUTION: Freq: Once | INTRAVENOUS | Status: AC

## 2021-05-01 NOTE — TOC Progression Note (Addendum)
Transition of Care [facility]) - Progression Note    Patient Details  Name: ROCKLYN MAYBERRY MRN: [id number] Date of Birth: December 18, 1940  Transition of Care [facility]) CM/SW Contact  Emeterio Reeve, [place] Phone Number: 05/01/2021, 12:37 PM  Clinical Narrative:     CSW called and left message for [facility] admissions coordinator to review pt. Per MD pt will be ready for DC tomorrow. Insurance auth  and covid test will need to be started.   2:10pm- CSW spoke to admissions coordinator at [facility] They are willing to accept pt back but they need to be able to make sure pt can participate in therapies and retain the information. IF not they believe the pt is custodial and long term care options should be explored. PT last saw pt on 12/15. Pt will need updated PT notes to start insurance auth and to address [place] concerns.   Expected Discharge Plan: [place] Barriers to Discharge: Continued Medical Work up, SNF Pending bed offer, Ship broker  Expected Discharge Plan and Services Expected Discharge Plan: [place] In-house Referral: Clinical Social Work   Post Acute Care Choice: [place] Living arrangements for the past 2 months: Single Family Home                                       Social Determinants of Health (SDOH) Interventions    Readmission Risk Interventions No flowsheet data found.

## 2021-05-01 NOTE — Plan of Care (Signed)
°  Problem: Nutrition: Goal: Adequate nutrition will be maintained Outcome: Progressing   Problem: Coping: Goal: Level of anxiety will decrease Outcome: Progressing   Problem: Elimination: Goal: Will not experience complications related to urinary retention Outcome: Progressing   Problem: Pain Managment: Goal: General experience of comfort will improve Outcome: Progressing

## 2021-05-01 NOTE — Progress Notes (Signed)
[facility] PROGRESS NOTE    Progress Note  Sara Fox  VOH:[id number] DOB: [birth year] DOA: 04/26/2021 PCP: [facility]     Brief Narrative:   Sara Fox is an 80 y.o. female past medical history of Alzheimer's dementia, ambulatory dysfunction, combined systolic and diastolic heart failure, COPD, stage IIIa chronic kidney disease, stage IIA NSLC lsince 2021 who underwent curative SBRT  with a suspicious small lower right paratracheal lymph node, recently discharged from the hospital, found down by EMS by her son, he relates that she has been complaining of no symptoms.  In the ED her hemoglobin was 9 creatinine 1.3, SARS-CoV-2 and influenza were negative CT of the head no evidence of acute intracranial abnormality, CT of the C-spine showed T2 and T3 compression fracture, left hip x-ray showed left acute intertrochanteric hip fracture with mild amputation, acute and subacute fracture throughout the superior and inferior pubic rami.  Orthopedic surgery was consulted who recommended surgical intervention  Assessment/Plan:   Unwitnessed fall leading to closed left hip fracture, initial encounter Sara Fox): Orthopedic surgery was consulted who recommended ORIF on 04/27/2021. Patient has only required Tylenol for pain discontinue narcotics, continue Robaxin.   Continue MiraLAX tolerating her diet. PT consulted, needs skilled nursing facility.  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 Continue tylenol for pain. Physical therapy evaluated the patient will need SNF  History of lung cancer status post therapy: Need outpatient follow-up with oncology.  Chronic combined systolic and diastolic heart failure: 2D echo in August 2022 showed an EF of 55% no wall motion abnormalities. Allow a diet resume her Lasix.  Chronic COPD/obstructive sleep  apnea: Continue inhalers.  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  Chronic kidney disease stage IIIa: Creatinine appears to be at baseline.  Normocytic anemia: Hemoglobin drop at 7, she relates she feels tired and fatigue, will transfuse 1 unit packed red blood cells recheck CBC posttransfusion.  Ethics/goals of care: Had a long discussion with the son who is the next of kin has been taking care of her for the last 10 years she currently lives with him.  They met with palliative care they would like to go to skilled nursing facility  Essential hypertension: Resume home dose of beta-blocker, continue IV metoprolol every 6 hours.  Sacral decubitus ulcer stage III present on admission: RN Pressure Injury Documentation: Pressure Injury 07/18/17 Stage II -  Partial thickness loss of dermis presenting as a shallow open ulcer with a red, pink wound bed without slough. (Active)  07/18/17 0730  Location: Sacrum  Location Orientation:   Staging: Stage II -  Partial thickness loss of dermis presenting as a shallow open ulcer with a red, pink wound bed without slough.  Wound Description (Comments):   Present on Admission: Yes     DVT prophylaxis: lovenox Family Communication:none Status is: Inpatient  Remains inpatient appropriate because: Mechanical fall leading to a left hip fracture with superior and inferior pubic ramus fracture needing surgical intervention.     Code Status:     Code Status Orders  (From admission, onward)           Start     Ordered   04/26/21 1750  Do not attempt resuscitation (DNR)  Continuous       Question Answer Comment  In the event  of cardiac or respiratory ARREST Do not call a code blue   In the event of cardiac or respiratory ARREST Do not perform Intubation, CPR, defibrillation or ACLS   In the event of cardiac or respiratory ARREST  Use medication by any route, position, wound care, and other measures to relive pain and suffering. May use oxygen, suction and manual treatment of airway obstruction as needed for comfort.      04/26/21 1749           Code Status History     Date Active Date Inactive Code Status Order ID Comments User Context   02/27/2021 1447 03/05/2021 1822 DNR [id number]  Orma Flaming, MD ED   11/22/2020 1906 11/27/2020 1711 DNR [id number]  Orma Flaming, MD ED   07/11/2017 0002 07/20/2017 2327 Full Code [id number]  Norval Morton, MD ED   09/14/2014 1337 09/15/2014 1546 Full Code [id number]  Leonie Man, MD Inpatient   09/12/2014 0849 09/14/2014 1337 Full Code [id number]  [name], NP Inpatient         IV Access:   Peripheral IV   Procedures and diagnostic studies:   No results found.   Medical Consultants:   None.   Subjective:    Sara Fox sleepy relates no complaints  Objective:    Vitals:   04/30/21 1959 05/01/21 0102 05/01/21 0235 05/01/21 0600  BP: (!) 140/92 117/68 (!) 142/71 131/71  Pulse: 90 86 81 80  Resp: 16  16   Temp: 98.6 F (37 C)  98.6 F (37 C)   TempSrc: Oral  Oral   SpO2: 97%  100%   Weight:      Height:       SpO2: 100 % O2 Flow Rate (L/min): 2 L/min   Intake/Output Summary (Last 24 hours) at 05/01/2021 0731 Last data filed at 05/01/2021 0500 Gross per 24 hour  Intake 1592 ml  Output --  Net 1592 ml    Filed Weights   04/27/21 1235 04/27/21 1602  Weight: 59 kg 59 kg    Exam: General exam: In no acute distress. Respiratory system: Good air movement and clear to auscultation. Cardiovascular system: S1 & S2 heard, RRR. No JVD. Gastrointestinal system: Abdomen is nondistended, soft and nontender.  Extremities: No pedal edema. Skin: No rashes, lesions or ulcers   Data Reviewed:    Labs: Basic Metabolic Panel: Recent Labs  Lab 04/26/21 1319 04/27/21 0323 04/28/21 0158 04/29/21 0052  NA 142 138 137 136  K 4.3 4.6  4.5 3.8  CL 113* 111 109 110  CO2 23 22 20* 20*  GLUCOSE 120* 131* 129* 108*  BUN 40* 29* 25* 24*  CREATININE 1.23* 1.01* 1.15* 1.24*  CALCIUM 8.9 8.5* 8.3* 8.1*  MG 2.2 1.9  --   --   PHOS  --  3.1  --   --     GFR Estimated Creatinine Clearance: 30.7 mL/min (A) (by C-G formula based on SCr of 1.24 mg/dL (H)). Liver Function Tests: Recent Labs  Lab 04/26/21 1319 04/27/21 0323  AST 22  --   ALT 19  --   ALKPHOS 168*  --   BILITOT 0.5  --   PROT 6.6  --   ALBUMIN 3.0* 2.4*    No results for input(s): LIPASE, AMYLASE in the last 168 hours. No results for input(s): AMMONIA in the last 168 hours. Coagulation profile Recent Labs  Lab 04/27/21 0323  INR 1.1    COVID-19 Labs  No  results for input(s): DDIMER, FERRITIN, LDH, CRP in the last 72 hours.   Lab Results  Component Value Date   SARSCOV2NAA NEGATIVE 04/26/2021   SARSCOV2NAA NEGATIVE 03/04/2021   [place] NEGATIVE 02/27/2021   [name] NEGATIVE 11/26/2020    CBC: Recent Labs  Lab 04/26/21 1319 04/27/21 0433 04/28/21 0158 04/29/21 0052  WBC 8.3 5.5 6.4 5.6  NEUTROABS 6.3  --   --   --   HGB 9.3* 8.2* 8.1* 7.2*  HCT 32.1* 27.0* 27.5* 23.3*  MCV 99.1 97.5 97.5 96.7  PLT 264 203 190 193    Cardiac Enzymes: Recent Labs  Lab 04/26/21 1319 04/27/21 0323 04/28/21 0158  CKTOTAL 110 140 137    BNP (last 3 results) No results for input(s): PROBNP in the last 8760 hours. CBG: Recent Labs  Lab 04/26/21 1311  GLUCAP 98    D-Dimer: No results for input(s): DDIMER in the last 72 hours. Hgb A1c: No results for input(s): HGBA1C in the last 72 hours. Lipid Profile: No results for input(s): CHOL, HDL, LDLCALC, TRIG, CHOLHDL, LDLDIRECT in the last 72 hours. Thyroid function studies: No results for input(s): TSH, T4TOTAL, T3FREE, THYROIDAB in the last 72 hours.  Invalid input(s): FREET3 Anemia work up: No results for input(s): VITAMINB12, FOLATE, FERRITIN, TIBC, IRON, RETICCTPCT in the last  72 hours.  Sepsis Labs: Recent Labs  Lab 04/26/21 1300 04/26/21 1319 04/27/21 0433 04/28/21 0158 04/29/21 0052  WBC  --  8.3 5.5 6.4 5.6  LATICACIDVEN 1.0  --   --   --   --     Microbiology Recent Results (from the past 240 hour(s))  Blood culture (routine x 2)     Status: Abnormal   Collection Time: 04/26/21  1:19 PM   Specimen: BLOOD  Result Value Ref Range Status   Specimen Description BLOOD RIGHT ANTECUBITAL  Final   Special Requests   Final    BOTTLES DRAWN AEROBIC AND ANAEROBIC Blood Culture results may not be optimal due to an excessive volume of blood received in culture bottles   Culture  Setup Time   Final    GRAM POSITIVE RODS ANAEROBIC BOTTLE ONLY CRITICAL RESULT CALLED TO, READ BACK BY AND VERIFIED WITH: V [email or web address]  04/17/21 mk    Culture (A)  Final    BACILLUS SPECIES Standardized susceptibility testing for this organism is not available. Performed at [facility], [place] [address] [place], [place] [address]    Report Status 04/29/2021 FINAL  Final  Blood culture (routine x 2)     Status: None (Preliminary result)   Collection Time: 04/26/21  1:19 PM   Specimen: BLOOD  Result Value Ref Range Status   Specimen Description BLOOD BLOOD RIGHT HAND  Final   Special Requests   Final    BOTTLES DRAWN AEROBIC AND ANAEROBIC Blood Culture adequate volume   Culture   Final    NO GROWTH 4 DAYS Performed at [facility], [place] [address], [place], [place] [address]    Report Status PENDING  Incomplete  Resp Panel by RT-PCR (Flu A&B, Covid) Nasopharyngeal Swab     Status: None   Collection Time: 04/26/21  1:34 PM   Specimen: Nasopharyngeal Swab; Nasopharyngeal(NP) swabs in vial transport medium  Result Value Ref Range Status   SARS Coronavirus 2 by RT PCR NEGATIVE NEGATIVE Final    Comment: (NOTE) SARS-CoV-2 target nucleic acids are NOT DETECTED.  The SARS-CoV-2 RNA is generally detectable in upper respiratory specimens during the acute phase  of  infection. The lowest concentration of SARS-CoV-2 viral copies this assay can detect is 138 copies/mL. A negative result does not preclude SARS-Cov-2 infection and should not be used as the sole basis for treatment or other patient management decisions. A negative result may occur with  improper specimen collection/handling, submission of specimen other than nasopharyngeal swab, presence of viral mutation(s) within the areas targeted by this assay, and inadequate number of viral copies(<138 copies/mL). A negative result must be combined with clinical observations, patient history, and epidemiological information. The expected result is Negative.  Fact Sheet for Patients:  EntrepreneurPulse.com.au  Fact Sheet for Healthcare Providers:  IncredibleEmployment.be  This test is no t yet approved or cleared by the [place] FDA and  has been authorized for detection and/or diagnosis of SARS-CoV-2 by FDA under an Emergency Use Authorization (EUA). This EUA will remain  in effect (meaning this test can be used) for the duration of the COVID-19 declaration under Section 564(b)(1) of the Act, 21 U.S.C.section 360bbb-3(b)(1), unless the authorization is terminated  or revoked sooner.       Influenza A by PCR NEGATIVE NEGATIVE Final   Influenza B by PCR NEGATIVE NEGATIVE Final    Comment: (NOTE) The Xpert Xpress SARS-CoV-2/FLU/RSV plus assay is intended as an aid in the diagnosis of influenza from Nasopharyngeal swab specimens and should not be used as a sole basis for treatment. Nasal washings and aspirates are unacceptable for Xpert Xpress SARS-CoV-2/FLU/RSV testing.  Fact Sheet for Patients: EntrepreneurPulse.com.au  Fact Sheet for Healthcare Providers: IncredibleEmployment.be  This test is not yet approved or cleared by the [place] FDA and has been authorized for detection and/or diagnosis of SARS-CoV-2  by FDA under an Emergency Use Authorization (EUA). This EUA will remain in effect (meaning this test can be used) for the duration of the COVID-19 declaration under Section 564(b)(1) of the Act, 21 U.S.C. section 360bbb-3(b)(1), unless the authorization is terminated or revoked.  Performed at [facility], [place] [address] [place], [place] [address]   Surgical pcr screen     Status: Abnormal   Collection Time: 04/27/21 12:33 PM   Specimen: Nasal Mucosa; Nasal Swab  Result Value Ref Range Status   MRSA, PCR POSITIVE (A) NEGATIVE Final    Comment: RESULT CALLED TO, READ BACK BY AND VERIFIED WITH:  ASHLEY D 1449 ADL    Staphylococcus aureus POSITIVE (A) NEGATIVE Final    Comment: (NOTE) The Xpert SA Assay (FDA approved for NASAL specimens in patients 5 years of age and older), is one component of a comprehensive surveillance program. It is not intended to diagnose infection nor to guide or monitor treatment. Performed at [facility], [place] [address], [place], [place] [address]      Medications:    acetaminophen  1,000 mg Oral Q8H   aspirin EC  81 mg Oral BID   busPIRone  5 mg Oral BID   Chlorhexidine Gluconate Cloth  6 each Topical Q0600   cholecalciferol  1,000 Units Oral Daily   docusate sodium  100 mg Oral BID   donepezil  10 mg Oral QHS   furosemide  20 mg Oral Once per day on Mon Tue Wed Thu Fri   gabapentin  100 mg Oral BID   memantine  5 mg Oral Daily   metoprolol tartrate  2.5 mg Intravenous Q6H   mometasone-formoterol  2 puff Inhalation BID   multivitamin with minerals  1 tablet Oral Daily   mupirocin ointment  1 application Nasal BID   pantoprazole  40 mg Oral BID   QUEtiapine  50 mg Oral QHS   Continuous Infusions:  methocarbamol (ROBAXIN) IV        LOS: 5 days   Charlynne Cousins  [facility]  05/01/2021, 7:31 AM

## 2021-05-02 ENCOUNTER — Ambulatory Visit (HOSPITAL_COMMUNITY): Payer: Medicare Other

## 2021-05-02 DIAGNOSIS — T148XXA Other injury of unspecified body region, initial encounter: Secondary | ICD-10-CM

## 2021-05-02 LAB — TYPE AND SCREEN
ABO/RH(D): O POS
Antibody Screen: NEGATIVE
Unit division: 0

## 2021-05-02 LAB — BPAM RBC
Blood Product Expiration Date: 202301102359
ISSUE DATE / TIME: 202212181302
Unit Type and Rh: 5100

## 2021-05-02 LAB — RESP PANEL BY RT-PCR (FLU A&B, COVID) ARPGX2
Influenza A by PCR: NEGATIVE
Influenza B by PCR: NEGATIVE
SARS Coronavirus 2 by RT PCR: NEGATIVE

## 2021-05-02 NOTE — Care Management Important Message (Signed)
Important Message  Patient Details  Name: Sara Fox MRN: [id number] Date of Birth: 03/10/1941   Medicare Important Message Given:  Yes     Hannah Beat 05/02/2021, 12:09 PM

## 2021-05-02 NOTE — TOC Progression Note (Addendum)
Transition of Care [facility]) - Progression Note    Patient Details  Name: LATOI GIRALDO MRN: [id number] Date of Birth: [birth year]  Transition of Care [facility] CM/SW [place], [place] Phone Number: 05/02/2021, 9:51 AM  Clinical Narrative:     CSW called [facility]); left message requesting return call.   1025: Received text response from Merom from [facility] stating she will review pt. CSW provided information from PT stating that pt has ability to improved from rehab and is able to participate.   CSW started insurance auth with facility pending.   1200: Shirlee Limerick with [facility] notified CSW that they can accept pt today pending covid test and auth. [facility] is approved 05/02/2021-05/04/2021 and [facility] added as facility. P[facility]#[id number] CSW called and notified pt's son of DC today.   Expected Discharge Plan: [place] Barriers to Discharge: Continued Medical Work up, SNF Pending bed offer, Ship broker  Expected Discharge Plan and Services Expected Discharge Plan: [place] In-house Referral: Clinical Social Work   Post Acute Care Choice: [place] Living arrangements for the past 2 months: Single Family Home Expected Discharge Date: 05/02/21                                     Social Determinants of Health (SDOH) Interventions    Readmission Risk Interventions No flowsheet data found.

## 2021-05-02 NOTE — Discharge Summary (Signed)
Physician Discharge Summary  Sara Fox KKX:[id number] DOB: Jan 19, 1941 DOA: 04/26/2021  PCP: [facility]  Admit date: 04/26/2021 Discharge date: 05/02/2021  Admitted From: Home Disposition:  SNF  Recommendations for Outpatient Follow-up:  Follow up with PCP in 1-2 weeks Please obtain BMP/CBC in one week   Home Health:no Equipment/Devices:none  Discharge Condition:Stable CODE STATUS:DNR Diet recommendation: Heart Healthy  Brief/Interim Summary:  80 y.o. female past medical history of Alzheimer's dementia, ambulatory dysfunction, combined systolic and diastolic heart failure, COPD, stage IIIa chronic kidney disease, stage IIA NSLC lsince 2021 who underwent curative SBRT  with a suspicious small lower right paratracheal lymph node, recently discharged from the hospital, found down by EMS by her son, he relates that she has been complaining of no symptoms.  In the ED her hemoglobin was 9 creatinine 1.3, SARS-CoV-2 and influenza were negative CT of the head no evidence of acute intracranial abnormality, CT of the C-spine showed T2 and T3 compression fracture, left hip x-ray showed left acute intertrochanteric hip fracture with mild amputation, acute and subacute fracture throughout the superior and inferior pubic rami.  Orthopedic surgery was consulted who recommended surgical intervention  Discharge Diagnoses:  Principal Problem:   Closed left hip fracture, initial encounter ([place])  Unwitnessed fall leading to closed left hip fracture: Orthopedic surgery was consulted recommended ORIF on 06/28/2020. The patient was requiring Tylenol for pain narcotics were discontinued. She was tolerating her MiraLAX and having regular bowel movement. Physical therapy consulted she will need skilled nursing facility.  Chronic multiple rib and vertebral compression fracture: With new subacute T2 and T4 compression fracture: Tolerating this with Tylenol.  Chronic combined systolic  and diastolic heart failure: No changes made to her medication continue current regimen.  Chronic COPD/obstructive sleep apnea: Excellent continue inhalers he is not using his CPAP at home.  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  History of CAD/remote stent: EKG showed no ischemic changes.  Chronic kidney disease stage IIIa: Creatinine appears to be at baseline no changes made.  Normocytic anemia: Hemoglobin dropped to 7 she was transfused 1 unit of packed red blood cells.  Her hemoglobin improved to 9.5. Will follow-up with PCP as an outpatient no signs of overt bleeding in house.  Ethics/goals of care: Had a long discussion with the son who is the next of kin for the last 10 years. Treatment with palliative care they would like to go to skilled nursing facility she is a DNR.  Essential hypertension: No change made to her medication continue current regimen.      Discharge Instructions  Discharge Instructions     Diet - low sodium heart healthy   Complete by: As directed    Increase activity slowly   Complete by: As directed    No wound care   Complete by: As directed       Allergies as of 05/02/2021       Reactions   Buprenorphine Hcl Shortness Of Breath   Throat swelling/trouble breathing and lethargic   Morphine And Related Shortness Of Breath   Throat swelling/trouble breathing and lethargic   Celebrex [celecoxib] Diarrhea, Swelling   Swelling of legs    Vioxx [rofecoxib] Palpitations   Codeine Other (See Comments)   Bloated         Medication List     STOP taking these medications    CALCIUM CITRATE+D3 PETITES PO   cholecalciferol  25 MCG (1000 UNIT) tablet Commonly known as: VITAMIN D   [employer] Magnesium Oxide 250 MG Tabs Generic drug: Magnesium Oxide -Mg Supplement   FISH OIL PO   multivitamin with minerals Tabs tablet    omeprazole 20 MG capsule Commonly known as: PRILOSEC       TAKE these medications    acetaminophen 500 MG tablet Commonly known as: TYLENOL Take 2 tablets (1,000 mg total) by mouth every 8 (eight) hours as needed for mild pain or moderate pain.   aspirin 81 MG EC tablet Take 1 tablet (81 mg total) by mouth 2 (two) times daily. Swallow whole. What changed:  when to take this reasons to take this additional instructions   budesonide-formoterol 160-4.5 MCG/ACT inhaler Commonly known as: Symbicort TAKE 2 PUFFS BY MOUTH TWICE A DAY What changed:  how much to take how to take this when to take this additional instructions   busPIRone 5 MG tablet Commonly known as: BUSPAR Take 5 mg by mouth 2 (two) times daily as needed.   ciclopirox 8 % solution Commonly known as: Penlac Apply 1 application topically at bedtime. Apply over nail and surrounding skin. Apply daily over previous coat. After seven (7) days, file nail and continue cycle.   donepezil 10 MG tablet Commonly known as: ARICEPT Take 1 tablet (10 mg total) by mouth at bedtime.   ferrous sulfate 325 (65 FE) MG tablet Take 325 mg by mouth daily with breakfast.   furosemide 20 MG tablet Commonly known as: LASIX Take 1 tablet by mouth Mon - Fri daily What changed:  how much to take how to take this when to take this additional instructions   gabapentin 100 MG capsule Commonly known as: NEURONTIN Take 1 capsule (100 mg total) by mouth 2 (two) times daily.   memantine 5 MG tablet Commonly known as: NAMENDA Take 1 tablet (5 mg total) by mouth daily.   methocarbamol 500 MG tablet Commonly known as: ROBAXIN Take 1 tablet (500 mg total) by mouth every 6 (six) hours as needed for muscle spasms.   metoprolol tartrate 25 MG tablet Commonly known as: LOPRESSOR Take 1 tablet (25 mg total) by mouth 2 (two) times daily.   nitroGLYCERIN 0.4 MG SL tablet Commonly known as: NITROSTAT PLACE ONE TABLET UNDER THE TONGUE  EVERY 5 MINUTES AS NEEDED FOR CHEST PAIN.   oxybutynin 5 MG tablet Commonly known as: DITROPAN Take 0.5 tablets (2.5 mg total) by mouth 2 (two) times daily.   pantoprazole 40 MG tablet Commonly known as: PROTONIX Take 1 tablet (40 mg total) by mouth 2 (two) times daily.   polyethylene glycol 17 g packet Commonly known as: MIRALAX / GLYCOLAX Take 17 g by mouth daily as needed for mild constipation.   Potassium Chloride ER 20 MEQ Tbcr Take 2 tablets by mouth every morning. What changed: how much to take   QUEtiapine 50 MG tablet Commonly known as: SEROQUEL TAKE 1 TABLET BY MOUTH EVERYDAY AT BEDTIME What changed:  how much to take how to take this when to take this additional instructions   rosuvastatin 40 MG tablet Commonly known as: CRESTOR Take 1 tablet (40 mg total) by mouth daily.   traMADol 50 MG tablet Commonly known as: ULTRAM TAKE 1 TABLET 3 TIMES A DAY AND CAN TAKE 1 EXTRA 20 DAYS/MONTH What changed:  how much to take how to take this when to take this additional instructions        Follow-up Information  Haddix, Thomasene Lot, MD. Schedule an appointment as soon as possible for a visit in 2 week(s).   Specialty: Orthopedic Surgery Why: for wound check and repeat x-rays Contact information: [place] [address] [phone]                Allergies  Allergen Reactions   Buprenorphine Hcl Shortness Of Breath    Throat swelling/trouble breathing and lethargic   Morphine And Related Shortness Of Breath    Throat swelling/trouble breathing and lethargic   Celebrex [Celecoxib] Diarrhea and Swelling    Swelling of legs    Vioxx [Rofecoxib] Palpitations   Codeine Other (See Comments)    Bloated     Consultations: Orthopedic surgery   Procedures/Studies: DG Chest 1 View  Result Date: 04/26/2021 CLINICAL DATA:  [place] out of chair, left hip fracture, non-small cell right lung cancer EXAM: CHEST  1 VIEW COMPARISON:   02/26/2021, 10/28/2020 FINDINGS: Single frontal view of the chest demonstrates a stable cardiac silhouette. Stable right apical mass consistent with biopsy-proven non-small cell lung cancer. Chronic areas of scarring are seen elsewhere within the mid upper lung zones. No new airspace disease, effusion, or pneumothorax. No acute bony abnormalities. IMPRESSION: 1. Stable right apical mass consistent with known biopsy-proven non-small cell lung cancer. 2. No acute intrathoracic trauma. Electronically Signed   By: Randa Ngo M.D.   On: 04/26/2021 15:24   CT Head Wo Contrast  Result Date: 04/26/2021 CLINICAL DATA:  Neck trauma (Age >= 65y); Head trauma, minor (Age >= 65y) EXAM: CT HEAD WITHOUT CONTRAST CT CERVICAL SPINE WITHOUT CONTRAST TECHNIQUE: Multidetector CT imaging of the head and cervical spine was performed following the standard protocol without intravenous contrast. Multiplanar CT image reconstructions of the cervical spine were also generated. COMPARISON:  CT head February 26, 2021. CT cervical spine November 22, 2020. FINDINGS: CT HEAD FINDINGS Brain: No evidence of acute large vascular infarction, hemorrhage, hydrocephalus, extra-axial collection or mass lesion/mass effect. Similar patchy white matter hypoattenuation, nonspecific but compatible with chronic microvascular ischemic disease. Vascular: No hyperdense vessel identified. Calcific intracranial atherosclerosis. Skull: No acute fracture. Sinuses/Orbits: Opacified left anterior ethmoid air cell. Otherwise, largely clear sinuses. Unremarkable orbits. Other: No mastoid effusions. CT CERVICAL SPINE FINDINGS Motion limited study.  Within this limitation: Alignment: Similar alignment to the prior. Similar anterolisthesis of C6 on C7 with similar focal kyphosis at this level. Of the lower cervical spine dextrocurvature Skull base and vertebrae: Redemonstrated chronic erosions along the base of the odontoid with posterior pannus. There is also severe  erosive change along the right atlantoaxial joint, similar. Cystic degenerative change of the dens, similar. C3-C5 ACDF with corpectomy at C4. No evidence of hardware fracture. Similar alignment. Fused facet joints at C3-C4 and C6-C7. New T2 (20%) and T3 (30%) vertebral body height loss with associated superior endplate sclerosis and lucency through the T3 superior endplate. Findings are partially imaged. Soft tissues and spinal canal: Motion limited evaluation without definite prevertebral edema or visible canal hematoma. Disc levels: Severe multilevel degenerative change. Craniocervical degenerative changes described above. Multilevel bilateral foraminal stenosis. Upper chest: Partially imaged nodular opacities in the posterior right upper lobe, reportedly biopsy-proven non-small cell lung cancer IMPRESSION: CT cervical spine: 1. Acute T2 and T3 compression fractures, described above and partially imaged. Recommend dedicated CT of the thoracic spine to further characterize and to fully evaluate the thoracic spine. 2. Similar postoperative changes of C3-C4 ACDF and C4 corpectomy with similar alignment. 3. Severe multilevel degenerative change, including severe  craniocervical degenerative change and multilevel foraminal stenosis. MRI could further characterize the canal/cord/foramina if clinically indicated. 4. Partially imaged nodular opacities in the posterior right upper lobe, reportedly biopsy-proven non-small cell lung cancer CT head: No evidence of acute intracranial abnormality. Electronically Signed   By: Margaretha Sheffield M.D.   On: 04/26/2021 14:41   CT Cervical Spine Wo Contrast  Result Date: 04/26/2021 CLINICAL DATA:  Neck trauma (Age >= 65y); Head trauma, minor (Age >= 65y) EXAM: CT HEAD WITHOUT CONTRAST CT CERVICAL SPINE WITHOUT CONTRAST TECHNIQUE: Multidetector CT imaging of the head and cervical spine was performed following the standard protocol without intravenous contrast. Multiplanar CT  image reconstructions of the cervical spine were also generated. COMPARISON:  CT head February 26, 2021. CT cervical spine November 22, 2020. FINDINGS: CT HEAD FINDINGS Brain: No evidence of acute large vascular infarction, hemorrhage, hydrocephalus, extra-axial collection or mass lesion/mass effect. Similar patchy white matter hypoattenuation, nonspecific but compatible with chronic microvascular ischemic disease. Vascular: No hyperdense vessel identified. Calcific intracranial atherosclerosis. Skull: No acute fracture. Sinuses/Orbits: Opacified left anterior ethmoid air cell. Otherwise, largely clear sinuses. Unremarkable orbits. Other: No mastoid effusions. CT CERVICAL SPINE FINDINGS Motion limited study.  Within this limitation: Alignment: Similar alignment to the prior. Similar anterolisthesis of C6 on C7 with similar focal kyphosis at this level. Of the lower cervical spine dextrocurvature Skull base and vertebrae: Redemonstrated chronic erosions along the base of the odontoid with posterior pannus. There is also severe erosive change along the right atlantoaxial joint, similar. Cystic degenerative change of the dens, similar. C3-C5 ACDF with corpectomy at C4. No evidence of hardware fracture. Similar alignment. Fused facet joints at C3-C4 and C6-C7. New T2 (20%) and T3 (30%) vertebral body height loss with associated superior endplate sclerosis and lucency through the T3 superior endplate. Findings are partially imaged. Soft tissues and spinal canal: Motion limited evaluation without definite prevertebral edema or visible canal hematoma. Disc levels: Severe multilevel degenerative change. Craniocervical degenerative changes described above. Multilevel bilateral foraminal stenosis. Upper chest: Partially imaged nodular opacities in the posterior right upper lobe, reportedly biopsy-proven non-small cell lung cancer IMPRESSION: CT cervical spine: 1. Acute T2 and T3 compression fractures, described above and partially  imaged. Recommend dedicated CT of the thoracic spine to further characterize and to fully evaluate the thoracic spine. 2. Similar postoperative changes of C3-C4 ACDF and C4 corpectomy with similar alignment. 3. Severe multilevel degenerative change, including severe craniocervical degenerative change and multilevel foraminal stenosis. MRI could further characterize the canal/cord/foramina if clinically indicated. 4. Partially imaged nodular opacities in the posterior right upper lobe, reportedly biopsy-proven non-small cell lung cancer CT head: No evidence of acute intracranial abnormality. Electronically Signed   By: Margaretha Sheffield M.D.   On: 04/26/2021 14:41   CT CHEST ABDOMEN PELVIS W CONTRAST  Result Date: 04/26/2021 CLINICAL DATA:  Dementia patient found on the floor after a fall. EXAM: CT CHEST, ABDOMEN, AND PELVIS WITH CONTRAST TECHNIQUE: Multidetector CT imaging of the chest, abdomen and pelvis was performed following the standard protocol during bolus administration of intravenous contrast. CONTRAST:  75mL OMNIPAQUE IOHEXOL 300 MG/ML  SOLN COMPARISON:  10/28/2020 FINDINGS: CT CHEST FINDINGS Cardiovascular: Art size upper limits of normal. No pericardial fluid. Coronary artery calcification and aortic atherosclerotic calcification as seen previously. Mediastinum/Nodes: No mediastinal or hilar mass or lymphadenopathy. Lungs/Pleura: Indistinct mass lesion at the right apex, apparently malignant by biopsy, appears slightly larger. This is difficult to measure precisely because of the morphology. Elsewhere, 6 mm subsolid nodule previously  seen in the superior segment of the left lower lobe is unchanged, axial image 37. No progressive feature. No pleural effusion seen on either side. No evidence of infectious pneumonia or pulmonary collapse. Musculoskeletal: No rib fracture on the right. On the left, there are numerous subacute healing rib fractures. There are subacute but somewhat more recent appearing  rib fractures posteriorly affecting the left seventh, eighth and ninth ribs. Old appearing compression deformity at T9. Minor superior endplate fracture at T3, not present in June of this year. Sternal fracture which appears subacute but not united, not present in June of this year. CT ABDOMEN PELVIS FINDINGS Hepatobiliary: No liver parenchymal abnormality. Previous cholecystectomy. Chronic biliary ductal prominence. Pancreas: Negative Spleen: Normal Adrenals/Urinary Tract: Adrenal glands are normal. Multiple renal cysts on the left. Small nonobstructing renal calculi. Bladder is normal. Stomach/Bowel: Previous gastric surgery. Left inguinal hernia containing several loops of small intestine. No evidence of obstruction or incarceration. No acute colon finding. Vascular/Lymphatic: Aortic atherosclerosis. No aneurysm. IVC is normal. No retroperitoneal adenopathy. Reproductive: No pelvic mass. Other: No free fluid or air. Musculoskeletal: Distant discectomy and fusion procedure L4 to the sacrum. Likely nonunion at L5-S1. subacute fracture of the pubic rami on the right, not yet healed. Acute intertrochanteric fracture of the left femur. IMPRESSION: Slight enlargement of an indistinct irregular mass lesion at the right apex, reported to be malignant by prior biopsy. No change in a 6 mm subsolid nodule in the superior segment of the left lower lobe. Multiple subacute rib fractures on the left of varying ages, not yet healed. No sign of acute fracture today. Old compression deformity at T9. More recent superior endplate fracture at T3, not present in June of this year, but probably subacute. Subacute sternal fracture, not yet united. No evidence of acute abdominal organ injury. Aortic atherosclerosis. Left inguinal hernia containing small bowel but no evidence of obstruction or incarceration. Subacute pubic rami fractures on the right. Acute intertrochanteric femoral fracture on the left. 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 Electronically Signed   By: Nelson Chimes M.D.   On: 04/26/2021 16:36   CT T-SPINE NO CHARGE  Result Date: 04/26/2021 CLINICAL DATA:  Fall with back pain EXAM: CT THORACIC SPINE WITHOUT CONTRAST TECHNIQUE: Multidetector CT images of the thoracic were obtained using the standard protocol without intravenous contrast. COMPARISON:  Cervical CT 11/22/2020.  CT chest 10/28/2020. FINDINGS: Alignment: Increased kyphotic curvature. Vertebrae: Old compression fracture at T9, not progressed since June. Newly seen superior endplate fractures at T2, T3 and T4, likely subacute. Loss of height 20% or less at those levels. No acute thoracic region fracture identified. Paraspinal and other soft tissues: Negative Disc levels: No significant thoracic degenerative disease. No bony stenosis of the canal or foramina. IMPRESSION: No acute thoracic spine fracture suspected. Old fracture of T9. Likely subacute fractures at T2, T3 and T4, with only minimal loss of height. Electronically Signed   By: Nelson Chimes M.D.   On: 04/26/2021 16:38   CT L-SPINE NO CHARGE  Result Date: 04/26/2021 CLINICAL DATA:  [place].  Assess for lumbar compression fracture. EXAM: CT LUMBAR SPINE WITHOUT CONTRAST TECHNIQUE: Multidetector CT imaging of the lumbar spine was performed without intravenous contrast administration. Multiplanar CT image reconstructions were also generated. COMPARISON:  MRI 02/27/2021 FINDINGS: Segmentation: 5 lumbar type vertebral bodies. Alignment: Fixed anterolisthesis at L4-5 of 8 mm. Anterolisthesis at L5-S1 of 7 mm. Vertebrae: No lumbar region fracture. Likely solid union at the L4-5 level. Nonunion at L5-S1, with pronounced  lucency surrounding the sacral screws and nitrogen gas in the L5-S1 disc level. Paraspinal and other soft tissues: Negative Disc levels: L1-2 and L2-3 are negative. L3-4 shows mild bulging of the disc and facet hypertrophy. See above regarding the surgical region from L4 to the sacrum.  IMPRESSION: No acute traumatic finding in the lumbar region. Previous discectomy and fusion procedure from L4 to the sacrum. 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 Electronically Signed   By: Nelson Chimes M.D.   On: 04/26/2021 16:41   DG Knee Left Port  Result Date: 04/26/2021 CLINICAL DATA:  Un witnessed fall, left knee pain EXAM: PORTABLE LEFT KNEE - 1-2 VIEW COMPARISON:  02/06/2006 FINDINGS: Frontal and lateral views of the left knee demonstrate 3 component left knee arthroplasty in the expected position without signs of acute complication. There are no acute displaced fractures. Small joint effusion. Diffuse soft tissue edema. IMPRESSION: 1. No acute displaced fracture. 2. Diffuse soft tissue edema and trace left knee effusion. 3. Unremarkable 3 component left knee arthroplasty. Electronically Signed   By: Randa Ngo M.D.   On: 04/26/2021 17:05   DG C-Arm 1-60 Min-No Report  Result Date: 04/27/2021 CLINICAL DATA:  Surgical internal fixation of left proximal femur fracture. EXAM: OPERATIVE left HIP (WITH PELVIS IF PERFORMED) 5 VIEWS TECHNIQUE: Fluoroscopic spot image(s) were submitted for interpretation post-operatively. Radiation exposure index: 15.52 mGy. COMPARISON:  April 26, 2021. FINDINGS: Five intraoperative fluoroscopic images were obtained of the left hip. These images demonstrate surgical internal fixation of proximal left femoral intertrochanteric fracture. IMPRESSION: Fluoroscopic guidance provided during surgical internal fixation of proximal left femoral intertrochanteric fracture. Electronically Signed   By: Marijo Conception M.D.   On: 04/27/2021 16:01   DG HIP PORT UNILAT W OR W/O PELVIS 1V LEFT  Result Date: 04/27/2021 CLINICAL DATA:  Hip fracture EXAM: DG HIP (WITH OR WITHOUT PELVIS) 1V PORT LEFT COMPARISON:  04/26/2021 FINDINGS: Interval intramedullary rod and distal screw fixation of the left femur  for intertrochanteric fracture. There is anatomic alignment. Gas in the soft tissues consistent with recent surgery. IMPRESSION: Interval surgical fixation of left intertrochanteric fracture with expected postsurgical change Electronically Signed   By: Donavan Foil M.D.   On: 04/27/2021 18:39   DG HIP OPERATIVE UNILAT WITH PELVIS LEFT  Result Date: 04/27/2021 CLINICAL DATA:  Surgical internal fixation of left proximal femur fracture. EXAM: OPERATIVE left HIP (WITH PELVIS IF PERFORMED) 5 VIEWS TECHNIQUE: Fluoroscopic spot image(s) were submitted for interpretation post-operatively. Radiation exposure index: 15.52 mGy. COMPARISON:  April 26, 2021. FINDINGS: Five intraoperative fluoroscopic images were obtained of the left hip. These images demonstrate surgical internal fixation of proximal left femoral intertrochanteric fracture. IMPRESSION: Fluoroscopic guidance provided during surgical internal fixation of proximal left femoral intertrochanteric fracture. Electronically Signed   By: Marijo Conception M.D.   On: 04/27/2021 16:01   DG Hip Unilat With Pelvis 2-3 Views Left  Result Date: 04/26/2021 CLINICAL DATA:  [place], left hip deformity EXAM: DG HIP (WITH OR WITHOUT PELVIS) 2-3V LEFT COMPARISON:  02/27/2021 FINDINGS: Frontal view of the pelvis as well as frontal and cross-table lateral views of the left hip are obtained. There is a displaced intertrochanteric left hip fracture, with mild impaction and varus angulation. No dislocation. There are fractures through the right superior and inferior pubic rami which are new since prior study. There is some sclerosis along the margins of these fractures and possible  periosteal reaction, which could suggest subacute injury. Fracture lines are still readily apparent [facility] for. The bones are diffusely osteopenic. Sacroiliac joints are unremarkable. Stable postsurgical changes at the lumbosacral junction. IMPRESSION: 1. Acute intertrochanteric left hip fracture, with  mild impaction and varus angulation. 2. Acute to subacute fractures through the right superior and inferior pubic rami. 3. Diffuse osteopenia. Electronically Signed   By: Randa Ngo M.D.   On: 04/26/2021 15:21     Subjective: No complaints  Discharge Exam: Vitals:   05/02/21 0739 05/02/21 0819  BP: 124/62   Pulse: 78   Resp: 16   Temp: 98.4 F (36.9 C)   SpO2: 96% 96%   Vitals:   05/01/21 2355 05/02/21 0352 05/02/21 0739 05/02/21 0819  BP: (!) 117/58 (!) 108/55 124/62   Pulse: 80 81 78   Resp:  18 16   Temp:  98.1 F (36.7 C) 98.4 F (36.9 C)   TempSrc:  Oral Oral   SpO2:  95% 96% 96%  Weight:      Height:        General: Pt is alert, awake, not in acute distress Cardiovascular: RRR, S1/S2 +, no rubs, no gallops Respiratory: CTA bilaterally, no wheezing, no rhonchi Abdominal: Soft, NT, ND, bowel sounds + Extremities: no edema, no cyanosis    The results of significant diagnostics from this hospitalization (including imaging, microbiology, ancillary and laboratory) are listed below for reference.     Microbiology: Recent Results (from the past 240 hour(s))  Blood culture (routine x 2)     Status: Abnormal   Collection Time: 04/26/21  1:19 PM   Specimen: BLOOD  Result Value Ref Range Status   Specimen Description BLOOD RIGHT ANTECUBITAL  Final   Special Requests   Final    BOTTLES DRAWN AEROBIC AND ANAEROBIC Blood Culture results may not be optimal due to an excessive volume of blood received in culture bottles   Culture  Setup Time   Final    GRAM POSITIVE RODS ANAEROBIC BOTTLE ONLY CRITICAL RESULT CALLED TO, READ BACK BY AND VERIFIED WITH: V BRYK,PHARMD@0206  04/17/21 mk    Culture (A)  Final    BACILLUS SPECIES Standardized susceptibility testing for this organism is not available. Performed at [facility], [place] [address] [place], [place] [address]    Report Status 04/29/2021 FINAL  Final  Blood culture (routine x 2)     Status: None    Collection Time: 04/26/21  1:19 PM   Specimen: BLOOD  Result Value Ref Range Status   Specimen Description BLOOD BLOOD RIGHT HAND  Final   Special Requests   Final    BOTTLES DRAWN AEROBIC AND ANAEROBIC Blood Culture adequate volume   Culture   Final    NO GROWTH 5 DAYS Performed at [facility], [place] [address], [place],  [address]    Report Status 05/01/2021 FINAL  Final  Resp Panel by RT-PCR (Flu A&B, Covid) Nasopharyngeal Swab     Status: None   Collection Time: 04/26/21  1:34 PM   Specimen: Nasopharyngeal Swab; Nasopharyngeal(NP) swabs in vial transport medium  Result Value Ref Range Status   SARS Coronavirus 2 by RT PCR NEGATIVE NEGATIVE Final    Comment: (NOTE) SARS-CoV-2 target nucleic acids are NOT DETECTED.  The SARS-CoV-2 RNA is generally detectable in upper respiratory specimens during the acute phase of infection. The lowest concentration of SARS-CoV-2 viral copies this assay can detect is 138 copies/mL. A negative result does not preclude  SARS-Cov-2 infection and should not be used as the sole basis for treatment or other patient management decisions. A negative result may occur with  improper specimen collection/handling, submission of specimen other than nasopharyngeal swab, presence of viral mutation(s) within the areas targeted by this assay, and inadequate number of viral copies(<138 copies/mL). A negative result must be combined with clinical observations, patient history, and epidemiological information. The expected result is Negative.  Fact Sheet for Patients:  EntrepreneurPulse.com.au  Fact Sheet for Healthcare Providers:  IncredibleEmployment.be  This test is no t yet approved or cleared by the [place] FDA and  has been authorized for detection and/or diagnosis of SARS-CoV-2 by FDA under an Emergency Use Authorization (EUA). This EUA will remain  in effect (meaning this test can be used) for the  duration of the COVID-19 declaration under Section 564(b)(1) of the Act, 21 U.S.C.section 360bbb-3(b)(1), unless the authorization is terminated  or revoked sooner.       Influenza A by PCR NEGATIVE NEGATIVE Final   Influenza B by PCR NEGATIVE NEGATIVE Final    Comment: (NOTE) The Xpert Xpress SARS-CoV-2/FLU/RSV plus assay is intended as an aid in the diagnosis of influenza from Nasopharyngeal swab specimens and should not be used as a sole basis for treatment. Nasal washings and aspirates are unacceptable for Xpert Xpress SARS-CoV-2/FLU/RSV testing.  Fact Sheet for Patients: EntrepreneurPulse.com.au  Fact Sheet for Healthcare Providers: IncredibleEmployment.be  This test is not yet approved or cleared by the [place] FDA and has been authorized for detection and/or diagnosis of SARS-CoV-2 by FDA under an Emergency Use Authorization (EUA). This EUA will remain in effect (meaning this test can be used) for the duration of the COVID-19 declaration under Section 564(b)(1) of the Act, 21 U.S.C. section 360bbb-3(b)(1), unless the authorization is terminated or revoked.  Performed at [facility], [place] [address], [place], [place] [address]   Surgical pcr screen     Status: Abnormal   Collection Time: 04/27/21 12:33 PM   Specimen: Nasal Mucosa; Nasal Swab  Result Value Ref Range Status   MRSA, PCR POSITIVE (A) NEGATIVE Final    Comment: RESULT CALLED TO, READ BACK BY AND VERIFIED WITH:  ASHLEY D 1449 ADL    Staphylococcus aureus POSITIVE (A) NEGATIVE Final    Comment: (NOTE) The Xpert SA Assay (FDA approved for NASAL specimens in patients 56 years of age and older), is one component of a comprehensive surveillance program. It is not intended to diagnose infection nor to guide or monitor treatment. Performed at [facility], [place] [address], [place], [place] [address]      Labs: BNP (last 3 results) Recent Labs     11/22/20 1354 02/27/21 0504  BNP 51.3 01.0   Basic Metabolic Panel: Recent Labs  Lab 04/26/21 1319 04/27/21 0323 04/28/21 0158 04/29/21 0052  NA 142 138 137 136  K 4.3 4.6 4.5 3.8  CL 113* 111 109 110  CO2 23 22 20* 20*  GLUCOSE 120* 131* 129* 108*  BUN 40* 29* 25* 24*  CREATININE 1.23* 1.01* 1.15* 1.24*  CALCIUM 8.9 8.5* 8.3* 8.1*  MG 2.2 1.9  --   --   PHOS  --  3.1  --   --    Liver Function Tests: Recent Labs  Lab 04/26/21 1319 04/27/21 0323  AST 22  --   ALT 19  --   ALKPHOS 168*  --   BILITOT 0.5  --   PROT 6.6  --  ALBUMIN 3.0* 2.4*   No results for input(s): LIPASE, AMYLASE in the last 168 hours. No results for input(s): AMMONIA in the last 168 hours. CBC: Recent Labs  Lab 04/26/21 1319 04/27/21 0433 04/28/21 0158 04/29/21 0052 05/01/21 1729  WBC 8.3 5.5 6.4 5.6  --   NEUTROABS 6.3  --   --   --   --   HGB 9.3* 8.2* 8.1* 7.2* 9.5*  HCT 32.1* 27.0* 27.5* 23.3* 31.2*  MCV 99.1 97.5 97.5 96.7  --   PLT 264 203 190 193  --    Cardiac Enzymes: Recent Labs  Lab 04/26/21 1319 04/27/21 0323 04/28/21 0158  CKTOTAL 110 140 137   BNP: Invalid input(s): POCBNP CBG: Recent Labs  Lab 04/26/21 1311  GLUCAP 98   D-Dimer No results for input(s): DDIMER in the last 72 hours. Hgb A1c No results for input(s): HGBA1C in the last 72 hours. Lipid Profile No results for input(s): CHOL, HDL, LDLCALC, TRIG, CHOLHDL, LDLDIRECT in the last 72 hours. Thyroid function studies No results for input(s): TSH, T4TOTAL, T3FREE, THYROIDAB in the last 72 hours.  Invalid input(s): FREET3 Anemia work up No results for input(s): VITAMINB12, FOLATE, FERRITIN, TIBC, IRON, RETICCTPCT in the last 72 hours. Urinalysis    Component Value Date/Time   COLORURINE YELLOW 04/26/2021 1300   APPEARANCEUR CLEAR 04/26/2021 1300   LABSPEC 1.010 04/26/2021 1300   PHURINE 7.5 04/26/2021 1300   GLUCOSEU NEGATIVE 04/26/2021 1300   HGBUR NEGATIVE 04/26/2021 1300   BILIRUBINUR  NEGATIVE 04/26/2021 1300   BILIRUBINUR Negative 12/15/2020 1551   KETONESUR NEGATIVE 04/26/2021 1300   PROTEINUR NEGATIVE 04/26/2021 1300   UROBILINOGEN 0.2 12/15/2020 1551   UROBILINOGEN 0.2 09/12/2014 2302   NITRITE NEGATIVE 04/26/2021 1300   LEUKOCYTESUR NEGATIVE 04/26/2021 1300   Sepsis Labs Invalid input(s): PROCALCITONIN,  WBC,  LACTICIDVEN Microbiology Recent Results (from the past 240 hour(s))  Blood culture (routine x 2)     Status: Abnormal   Collection Time: 04/26/21  1:19 PM   Specimen: BLOOD  Result Value Ref Range Status   Specimen Description BLOOD RIGHT ANTECUBITAL  Final   Special Requests   Final    BOTTLES DRAWN AEROBIC AND ANAEROBIC Blood Culture results may not be optimal due to an excessive volume of blood received in culture bottles   Culture  Setup Time   Final    GRAM POSITIVE RODS ANAEROBIC BOTTLE ONLY CRITICAL RESULT CALLED TO, READ BACK BY AND VERIFIED WITH: V BRYK,PHARMD@0206  04/17/21 mk    Culture (A)  Final    BACILLUS SPECIES Standardized susceptibility testing for this organism is not available. Performed at [facility], [place] [address] [place], [place] [address]    Report Status 04/29/2021 FINAL  Final  Blood culture (routine x 2)     Status: None   Collection Time: 04/26/21  1:19 PM   Specimen: BLOOD  Result Value Ref Range Status   Specimen Description BLOOD BLOOD RIGHT HAND  Final   Special Requests   Final    BOTTLES DRAWN AEROBIC AND ANAEROBIC Blood Culture adequate volume   Culture   Final    NO GROWTH 5 DAYS Performed at [facility], [place] [address] [place], [place] [address]    Report Status 05/01/2021 FINAL  Final  Resp Panel by RT-PCR (Flu A&B, Covid) Nasopharyngeal Swab     Status: None   Collection Time: 04/26/21  1:34 PM   Specimen: Nasopharyngeal Swab; Nasopharyngeal(NP) swabs in vial transport medium  Result Value Ref Range Status   SARS Coronavirus 2 by RT PCR NEGATIVE NEGATIVE Final    Comment:  (NOTE) SARS-CoV-2 target nucleic acids are NOT DETECTED.  The SARS-CoV-2 RNA is generally detectable in upper respiratory specimens during the acute phase of infection. The lowest concentration of SARS-CoV-2 viral copies this assay can detect is 138 copies/mL. A negative result does not preclude SARS-Cov-2 infection and should not be used as the sole basis for treatment or other patient management decisions. A negative result may occur with  improper specimen collection/handling, submission of specimen other than nasopharyngeal swab, presence of viral mutation(s) within the areas targeted by this assay, and inadequate number of viral copies(<138 copies/mL). A negative result must be combined with clinical observations, patient history, and epidemiological information. The expected result is Negative.  Fact Sheet for Patients:  EntrepreneurPulse.com.au  Fact Sheet for Healthcare Providers:  IncredibleEmployment.be  This test is no t yet approved or cleared by the [place] FDA and  has been authorized for detection and/or diagnosis of SARS-CoV-2 by FDA under an Emergency Use Authorization (EUA). This EUA will remain  in effect (meaning this test can be used) for the duration of the COVID-19 declaration under Section 564(b)(1) of the Act, 21 U.S.C.section 360bbb-3(b)(1), unless the authorization is terminated  or revoked sooner.       Influenza A by PCR NEGATIVE NEGATIVE Final   Influenza B by PCR NEGATIVE NEGATIVE Final    Comment: (NOTE) The Xpert Xpress SARS-CoV-2/FLU/RSV plus assay is intended as an aid in the diagnosis of influenza from Nasopharyngeal swab specimens and should not be used as a sole basis for treatment. Nasal washings and aspirates are unacceptable for Xpert Xpress SARS-CoV-2/FLU/RSV testing.  Fact Sheet for Patients: EntrepreneurPulse.com.au  Fact Sheet for Healthcare  Providers: IncredibleEmployment.be  This test is not yet approved or cleared by the [place] FDA and has been authorized for detection and/or diagnosis of SARS-CoV-2 by FDA under an Emergency Use Authorization (EUA). This EUA will remain in effect (meaning this test can be used) for the duration of the COVID-19 declaration under Section 564(b)(1) of the Act, 21 U.S.C. section 360bbb-3(b)(1), unless the authorization is terminated or revoked.  Performed at [facility], [place] [address] [place], [place] [address]   Surgical pcr screen     Status: Abnormal   Collection Time: 04/27/21 12:33 PM   Specimen: Nasal Mucosa; Nasal Swab  Result Value Ref Range Status   MRSA, PCR POSITIVE (A) NEGATIVE Final    Comment: RESULT CALLED TO, READ BACK BY AND VERIFIED WITH:  ASHLEY D 1449 ADL    Staphylococcus aureus POSITIVE (A) NEGATIVE Final    Comment: (NOTE) The Xpert SA Assay (FDA approved for NASAL specimens in patients 59 years of age and older), is one component of a comprehensive surveillance program. It is not intended to diagnose infection nor to guide or monitor treatment. Performed at [facility], [place] [address], [place], [place] [address]     SIGNED:   Charlynne Cousins, MD  [facility] 05/02/2021, 8:33 AM Pager   If 7PM-7AM, please contact night-coverage [email or web address] Password [email or web address]

## 2021-05-02 NOTE — TOC Transition Note (Signed)
Transition of Care [facility]) - CM/SW Discharge Note   Patient Details  Name: Sara Fox MRN: [id number] Date of Birth: [birth year]  Transition of Care [facility]) CM/SW Contact:  Bethann Berkshire, [place] Phone Number: 05/02/2021, 1:29 PM   Clinical Narrative:     Patient will DC to: [place] Anticipated DC date: 05/02/21 Family notified: [name], son Transport by: Corey Harold   Per MD patient ready for DC to [facility]. RN, patient, patient's family, and facility notified of DC. Discharge Summary and FL2 sent to facility. RN to call report prior to discharge (Rayle). DC packet on chart. Ambulance transport requested for patient.   CSW will sign off for now as social work intervention is no longer needed. Please consult [place] again if new needs arise.   Final next level of care: Skilled Nursing Facility Barriers to Discharge: No Barriers Identified   Patient Goals and CMS Choice Patient states their goals for this hospitalization and ongoing recovery are:: Pt is disoriented and unable to participate in DC planning at this time. CMS Medicare.gov Compare Post Acute Care list provided to:: Patient Represenative (must comment) Choice offered to / list presented to : Adult Children [name])  Discharge Placement              Patient chooses bed at:  [facility]) Patient to be transferred to facility by: [place] Name of family member notified: [name], son Patient and family notified of of transfer: 05/02/21  Discharge Plan and Services In-house Referral: Clinical Social Work   Post Acute Care Choice: [place]                               Social Determinants of Health (SDOH) Interventions     Readmission Risk Interventions No flowsheet data found.

## 2021-05-02 NOTE — Plan of Care (Signed)
Dementia Alert and oriented x1 only. She is not teachable. Information will be sent to accepting Facility.  Problem: Education: Goal: Knowledge of General Education information will improve Description: Including pain rating scale, medication(s)/side effects and non-pharmacologic comfort measures Outcome: Not Progressing   Problem: Health Behavior/Discharge Planning: Goal: Ability to manage health-related needs will improve Outcome: Not Progressing   Problem: Clinical Measurements: Goal: Ability to maintain clinical measurements within normal limits will improve Outcome: Not Progressing Goal: Will remain free from infection Outcome: Not Progressing Goal: Diagnostic test results will improve Outcome: Not Progressing Goal: Respiratory complications will improve Outcome: Not Progressing Goal: Cardiovascular complication will be avoided Outcome: Not Progressing   Problem: Coping: Goal: Level of anxiety will decrease Outcome: Not Progressing   Problem: Safety: Goal: Ability to remain free from injury will improve Outcome: Not Progressing   Problem: Skin Integrity: Goal: Risk for impaired skin integrity will decrease Outcome: Not Progressing

## 2021-05-02 NOTE — Progress Notes (Signed)
Mobility Specialist Progress Note   05/02/21 1250  Mobility  Activity  (Bed Mobility)  Range of Motion/Exercises All extremities  Level of Assistance Moderate assist, patient does 50-74%  Assistive Device None  Mobility Response Tolerated fair  Mobility performed by Mobility specialist  $Mobility charge 1 Mobility   Received pt in bed c/o their UE hurting, described it to be "burning like pain" but pt agreeable to mobility session. Worked on bed mobility where pt demonstrated mod UE weakness w/ limited activity tolerance. Session limited d/t inc pain and tiredness. Left w/ needs met and call bell in reach. RN notified about initial pain.       Holland Falling Mobility Specialist Phone Number [phone]

## 2021-05-02 NOTE — Progress Notes (Signed)
Physical Therapy Treatment Patient Details Name: Sara Fox MRN: [id number] DOB: July 07, 1940 Today's Date: 05/02/2021   History of Present Illness Pt is an 80 y/o F presenting from home after unwitnessed fall 04/26/21, found to have L hip fracture, s/p IM nail on 12/14 WBAT post op. PMH includes dementia, COPD, CHF, CKD, stage IV lung cancer s/p SBRT.    PT Comments    Continuing work on functional mobility and activity tolerance;  Sara Fox is showing much better ability to participate this morning; She identifies where she hurts and severity of pain -- making distinctions as to differing levels of pain at rest versus during exercises versus with LLE weight bearing in standing;   Able to use her RLE to halfbridge to EOB in prep for getting up; Mod assist to get to sitting EOB; two-person heavy Mod to Max assist to stand and then to pivot to recliner using RW; Able to tolerate standing -- heavy physical assist needed to maintain balance due to heavy posterior lean;   Today's session is likely much more indicative of Sara Fox's ability to participate in therapies and efforts at increasing independence and safety with mobility; she is showing much improved rehab potential.  Recommendations for follow up therapy are one component of a multi-disciplinary discharge planning process, led by the attending physician.  Recommendations may be updated based on patient status, additional functional criteria and insurance authorization.  Follow Up Recommendations  Skilled nursing-short term rehab (<3 hours/day)     Assistance Recommended at Discharge Intermittent Supervision/Assistance  Equipment Recommendations  Wheelchair (measurements PT);Wheelchair cushion (measurements PT);Hospital bed;Other (comment)    Recommendations for Other Services       Precautions / Restrictions Precautions Precautions: Fall Restrictions Weight Bearing Restrictions: No LLE Weight Bearing: Weight bearing as  tolerated     Mobility  Bed Mobility Overal bed mobility: Needs Assistance Bed Mobility: Supine to Sit     Supine to sit: Mod assist;+2 for safety/equipment     General bed mobility comments: Able to use less painful RLE to perform half-bridges to get closer to EOB; Light mod handheld assist to pull to sit; following all commands/suggestions for getting up    Transfers Overall transfer level: Needs assistance Equipment used: Rolling walker (2 wheels) Transfers: Sit to/from Stand;Bed to chair/wheelchair/BSC Sit to Stand: +2 physical assistance;+2 safety/equipment;Mod assist;Max assist Stand pivot transfers: Max assist;+2 physical assistance;+2 safety/equipment         General transfer comment: Performed very nice scooting to EOB and anterior weight shift in prep for transfer to stand; Heavy mod assist to rise; posterior lean/bias; 2 person Max assist to stand pivot to recliner placed on her less painful R side; Pt able to accept weight onto LLE in standing -- required Max assist to keep upright as she had a heavy posterior lean throughout the transition to the recliner    Ambulation/Gait                   Stairs             Wheelchair Mobility    Modified Rankin (Stroke Patients Only)       Balance     Sitting balance-Leahy Scale: Fair (approaching Good)       Standing balance-Leahy Scale: Zero Standing balance comment: Able to accept weight onto LLE; REquires Max assist in standing due to heavy posterior lean  Cognition Arousal/Alertness: Awake/alert Behavior During Therapy: WFL for tasks assessed/performed Overall Cognitive Status: History of cognitive impairments - at baseline                                 General Comments: Able to participate well; Following cues and commands related to mobiltiy consistently; Counted along with reps of exercises; As we ended the session, she was able to  express that her pain was "minimal" sitting in the recliner; when asked about her pain when we stood, she indicated it was worse standing, and called it "moderate"; Able to identify that if we weren't there helping her that she was leaning back so much that she would have "sat down hard"        Exercises Total Joint Exercises Quad Sets: AROM;Left;10 reps Towel Squeeze: AROM;Both;5 reps Heel Slides: AAROM;AROM;Left;10 reps    General Comments        Pertinent Vitals/Pain Pain Assessment: Faces Faces Pain Scale: Hurts even more Pain Location: LLE with standing and weight bearing Pain Descriptors / Indicators: Aching;Grimacing;Discomfort;Guarding Pain Intervention(s): Monitored during session;Repositioned    Home Living                          Prior Function            PT Goals (current goals can now be found in the care plan section) Acute Rehab PT Goals Patient Stated Goal: Agrees to gettin gOOB; wants ice cream (chocolate) and coffee PT Goal Formulation: With patient (Able to state that she wants to get up today) Time For Goal Achievement: 05/12/21 Potential to Achieve Goals: Good Progress towards PT goals: Progressing toward goals (Very nice progress)    Frequency    Min 3X/week      PT Plan Current plan remains appropriate    Co-evaluation              AM-PAC PT "6 Clicks" Mobility   Outcome Measure  Help needed turning from your back to your side while in a flat bed without using bedrails?: A Lot Help needed moving from lying on your back to sitting on the side of a flat bed without using bedrails?: A Lot Help needed moving to and from a bed to a chair (including a wheelchair)?: A Lot Help needed standing up from a chair using your arms (e.g., wheelchair or bedside chair)?: A Lot Help needed to walk in hospital room?: Total Help needed climbing 3-5 steps with a railing? : Total 6 Click Score: 10    End of Session Equipment Utilized  During Treatment: Gait belt Activity Tolerance: Patient tolerated treatment well Patient left: in chair;with call bell/phone within reach;with chair alarm set;with nursing/sitter in room Nurse Communication: Mobility status;Weight bearing status PT Visit Diagnosis: Muscle weakness (generalized) (M62.81);Difficulty in walking, not elsewhere classified (R26.2);Pain Pain - Right/Left: Left Pain - part of body: Hip     Time: 0919-0950 PT Time Calculation (min) (ACUTE ONLY): 31 min  Charges:  $Therapeutic Exercise: 8-22 mins $Therapeutic Activity: 8-22 mins                     Roney Marion, PT  Acute Rehabilitation Services Pager [phone] Office [place] 05/02/2021, 10:35 AM

## 2021-05-03 ENCOUNTER — Inpatient Hospital Stay: Payer: Medicare Other | Attending: Internal Medicine | Admitting: Internal Medicine

## 2021-05-03 ENCOUNTER — Other Ambulatory Visit: Payer: Self-pay

## 2021-05-03 ENCOUNTER — Encounter: Payer: Self-pay | Admitting: Internal Medicine

## 2021-05-03 VITALS — BP 92/60 | HR 82 | Temp 97.8°F | Resp 18 | Ht 64.0 in | Wt 140.6 lb

## 2021-05-03 DIAGNOSIS — C3490 Malignant neoplasm of unspecified part of unspecified bronchus or lung: Secondary | ICD-10-CM

## 2021-05-03 DIAGNOSIS — C3411 Malignant neoplasm of upper lobe, right bronchus or lung: Secondary | ICD-10-CM | POA: Diagnosis present

## 2021-05-03 DIAGNOSIS — F0390 Unspecified dementia without behavioral disturbance: Secondary | ICD-10-CM | POA: Diagnosis not present

## 2021-05-03 DIAGNOSIS — R5383 Other fatigue: Secondary | ICD-10-CM | POA: Diagnosis not present

## 2021-05-03 DIAGNOSIS — C3491 Malignant neoplasm of unspecified part of right bronchus or lung: Secondary | ICD-10-CM

## 2021-05-03 DIAGNOSIS — Z923 Personal history of irradiation: Secondary | ICD-10-CM | POA: Insufficient documentation

## 2021-05-03 NOTE — Progress Notes (Signed)
[facility] [phone]   [phone]  OFFICE PROGRESS NOTE  [facility] [address] [place] [address]  DIAGNOSIS: Stage IIA (T2b, N0, M0) non-small cell lung cancer with nonspecific subtype diagnosed in November 2021 and presenting with cystic/solid right upper lobe lung mass with a suspicious small lower right paratracheal lymph node  PRIOR THERAPY: Status post curative radiotherapy under the care of Dr. Sondra Come completed on 08/16/2020.  CURRENT THERAPY: Observation.  INTERVAL HISTORY: Sara Fox 80 y.o. female returns to the clinic today for follow-up visit accompanied by her caregiver.  The patient is currently a resident of [facility] skilled nursing facility.  She was admitted to the hospital 10 days ago with left intertrochanteric femoral fracture status post nailing of the left hip on April 27, 2021.  She required PRBCs transfusion during her admission.  She is feeling a little bit better today with no concerning complaints except for the fatigue.  She has no current chest pain, shortness of breath, cough or hemoptysis.  She denied having any fever or chills.  She has no nausea, vomiting, diarrhea or constipation.  She has no headache or visual changes.  During her hospitalization she has repeat CT scan of the chest, abdomen and pelvis for restaging of her disease.  Patient is here today for evaluation and discussion of her scan results. MEDICAL HISTORY: Past Medical History:  Diagnosis Date   Acute kidney injury ([place])    Back pain    Cervical cancer (HCC)    CHF (congestive heart failure) (HCC)    Closed fracture of left distal radius 05/28/2017   COPD (chronic obstructive pulmonary disease) (HCC)    Coronary artery disease    s/p DES x2 to RCA 2016   GERD (gastroesophageal reflux disease)    History of radiation therapy 07/27/20-08/16/20   Right lung- SBRT- Dr. Gery Pray    Hyperlipidemia    Hypertension     Lower leg edema 12/21/2016   Mild cognitive impairment    Myocardial infarction [facility])    Neck pain    Rhabdomyolysis    Stomach problems     ALLERGIES:  is allergic to buprenorphine hcl, morphine and related, celebrex [celecoxib], vioxx [rofecoxib], and codeine.  MEDICATIONS:  Current Outpatient Medications  Medication Sig Dispense Refill   acetaminophen (TYLENOL) 500 MG tablet Take 2 tablets (1,000 mg total) by mouth every 8 (eight) hours as needed for mild pain or moderate pain. 30 tablet 0   aspirin EC 81 MG EC tablet Take 1 tablet (81 mg total) by mouth 2 (two) times daily. Swallow whole. 60 tablet 0   budesonide-formoterol (SYMBICORT) 160-4.5 MCG/ACT inhaler TAKE 2 PUFFS BY MOUTH TWICE A DAY (Patient taking differently: Inhale 2 puffs into the lungs in the morning and at bedtime.) 30.6 each 1   busPIRone (BUSPAR) 5 MG tablet Take 5 mg by mouth 2 (two) times daily as needed.     ciclopirox (PENLAC) 8 % solution Apply 1 application topically at bedtime. Apply over nail and surrounding skin. Apply daily over previous coat. After seven (7) days, file nail and continue cycle.     donepezil (ARICEPT) 10 MG tablet Take 1 tablet (10 mg total) by mouth at bedtime. 90 tablet 1   ferrous sulfate 325 (65 FE) MG tablet Take 325 mg by mouth daily with breakfast.     furosemide (LASIX) 20 MG tablet Take 1 tablet by mouth Mon - Fri daily (Patient  taking differently: Take 20 mg by mouth See admin instructions. Monday-friday) 80 tablet 1   gabapentin (NEURONTIN) 100 MG capsule Take 1 capsule (100 mg total) by mouth 2 (two) times daily. 180 capsule 1   memantine (NAMENDA) 5 MG tablet Take 1 tablet (5 mg total) by mouth daily. 90 tablet 1   methocarbamol (ROBAXIN) 500 MG tablet Take 1 tablet (500 mg total) by mouth every 6 (six) hours as needed for muscle spasms. 20 tablet 0   metoprolol tartrate (LOPRESSOR) 25 MG tablet Take 1 tablet (25 mg total) by mouth 2 (two) times daily. 180 tablet 0   nitroGLYCERIN  (NITROSTAT) 0.4 MG SL tablet PLACE ONE TABLET UNDER THE TONGUE EVERY 5 MINUTES AS NEEDED FOR CHEST PAIN. (Patient not taking: Reported on 04/26/2021) 25 tablet 1   oxybutynin (DITROPAN) 5 MG tablet Take 0.5 tablets (2.5 mg total) by mouth 2 (two) times daily. 180 tablet 0   pantoprazole (PROTONIX) 40 MG tablet Take 1 tablet (40 mg total) by mouth 2 (two) times daily. 180 tablet 1   polyethylene glycol (MIRALAX / GLYCOLAX) packet Take 17 g by mouth daily as needed for mild constipation.     Potassium Chloride ER 20 MEQ TBCR Take 2 tablets by mouth every morning. (Patient taking differently: Take 40 mEq by mouth every morning.) 180 tablet 0   QUEtiapine (SEROQUEL) 50 MG tablet TAKE 1 TABLET BY MOUTH EVERYDAY AT BEDTIME (Patient taking differently: Take 50 mg by mouth at bedtime.) 90 tablet 1   rosuvastatin (CRESTOR) 40 MG tablet Take 1 tablet (40 mg total) by mouth daily. (Patient not taking: Reported on 04/26/2021) 90 tablet 0   traMADol (ULTRAM) 50 MG tablet TAKE 1 TABLET 3 TIMES A DAY AND CAN TAKE 1 EXTRA 20 DAYS/MONTH (Patient taking differently: Take 50 mg by mouth in the morning, at noon, and at bedtime.) 110 tablet 0   No current facility-administered medications for this visit.    SURGICAL HISTORY:  Past Surgical History:  Procedure Laterality Date   ABDOMINAL HYSTERECTOMY     BACK SURGERY  2012   lower back   BRONCHIAL BIOPSY  05/25/2020   Procedure: BRONCHIAL BIOPSIES;  Surgeon: Collene Gobble, MD;  Location: [facility] ENDOSCOPY;  Service: Pulmonary;;   BRONCHIAL BRUSHINGS  05/25/2020   Procedure: BRONCHIAL BRUSHINGS;  Surgeon: Collene Gobble, MD;  Location: [facility] ENDOSCOPY;  Service: Pulmonary;;   BRONCHIAL NEEDLE ASPIRATION BIOPSY  05/25/2020   Procedure: BRONCHIAL NEEDLE ASPIRATION BIOPSIES;  Surgeon: Collene Gobble, MD;  Location: [facility] ENDOSCOPY;  Service: Pulmonary;;   CARDIAC CATHETERIZATION  06/1999   noncritical disease invovling PDA   CARDIAC CATHETERIZATION N/A 09/14/2014   Procedure:  Left Heart Cath and Coronary Angiography;  Surgeon: Leonie Man, MD;  Location: [facility] INVASIVE CV LAB CUPID;  Service: Cardiovascular;  Laterality: N/A;   CHOLECYSTECTOMY     ESOPHAGOGASTRODUODENOSCOPY (EGD) WITH PROPOFOL Left 07/19/2017   Procedure: ESOPHAGOGASTRODUODENOSCOPY (EGD) WITH PROPOFOL;  Surgeon: Ronnette Juniper, MD;  Location: [facility] ENDOSCOPY;  Service: Gastroenterology;  Laterality: Left;   FINE NEEDLE ASPIRATION  05/25/2020   Procedure: FINE NEEDLE ASPIRATION (FNA) LINEAR;  Surgeon: Collene Gobble, MD;  Location: [facility] ENDOSCOPY;  Service: Pulmonary;;   FRACTURE SURGERY Left 05/2017   left wrist   HARDWARE REMOVAL Left 11/07/2017   Procedure: LEFT WRIST HARDWARE REMOVAL;  Surgeon: Charlotte Crumb, MD;  Location: [facility];  Service: Orthopedics;  Laterality: Left;   INTRAMEDULLARY (IM) NAIL INTERTROCHANTERIC Left 04/27/2021   Procedure: INTRAMEDULLARY (IM) NAIL INTERTROCHANTRIC;  Surgeon: Shona Needles, MD;  Location: [facility];  Service: Orthopedics;  Laterality: Left;   KNEE SURGERY Bilateral 2001 & 2007   NECK SURGERY  2012   2012   PARTIAL GASTRECTOMY  2005   subtotal   PERCUTANEOUS CORONARY STENT INTERVENTION (PCI-S)  09/14/2014   Procedure: Percutaneous Coronary Stent Intervention (Pci-S);  Surgeon: Leonie Man, MD;  Location: [facility] INVASIVE CV LAB CUPID;  Service: Cardiovascular;;   SHOULDER SURGERY Right    TRANSTHORACIC ECHOCARDIOGRAM  06/02/2010   EF=>55%, normal LV systolic function; normal RV systolic function; mild mitral annular calcif; trace TR; AV mildly sclerotic   VIDEO BRONCHOSCOPY WITH ENDOBRONCHIAL NAVIGATION N/A 05/25/2020   Procedure: VIDEO BRONCHOSCOPY WITH ENDOBRONCHIAL NAVIGATION;  Surgeon: Collene Gobble, MD;  Location: [facility] ENDOSCOPY;  Service: Pulmonary;  Laterality: N/A;   VIDEO BRONCHOSCOPY WITH ENDOBRONCHIAL ULTRASOUND N/A 05/25/2020   Procedure: VIDEO BRONCHOSCOPY WITH ENDOBRONCHIAL ULTRASOUND;  Surgeon: Collene Gobble, MD;  Location: [facility] ENDOSCOPY;  Service:  Pulmonary;  Laterality: N/A;   WRIST OSTEOTOMY Left 11/07/2017   Procedure: LEFT WRIST DISTAL ULNA RESECTION WITH EXTENSOR CARPI ULNARIS STABILIZATION;  Surgeon: Charlotte Crumb, MD;  Location: [facility];  Service: Orthopedics;  Laterality: Left;    REVIEW OF SYSTEMS:  A comprehensive review of systems was negative except for: Constitutional: positive for fatigue Respiratory: positive for cough Musculoskeletal: positive for arthralgias Behavioral/Psych: positive for sleep disturbance   PHYSICAL EXAMINATION: General appearance: alert, cooperative, and appears older than stated age Head: Normocephalic, without obvious abnormality, atraumatic Neck: no adenopathy, no JVD, supple, symmetrical, trachea midline, and thyroid not enlarged, symmetric, no tenderness/mass/nodules Lymph nodes: Cervical, supraclavicular, and axillary nodes normal. Resp: clear to auscultation bilaterally Back: symmetric, no curvature. ROM normal. No CVA tenderness. Cardio: regular rate and rhythm, S1, S2 normal, no murmur, click, rub or gallop GI: soft, non-tender; bowel sounds normal; no masses,  no organomegaly Extremities: extremities normal, atraumatic, no cyanosis or edema  ECOG PERFORMANCE STATUS: 1 - Symptomatic but completely ambulatory  Blood pressure 92/60, pulse 82, temperature 97.8 F (36.6 C), temperature source Tympanic, resp. rate 18, height 5\' 4"  (1.626 m), weight 140 lb 9.6 oz (63.8 kg), SpO2 98 %.  LABORATORY DATA: Lab Results  Component Value Date   WBC 5.6 04/29/2021   HGB 9.5 (L) 05/01/2021   HCT 31.2 (L) 05/01/2021   MCV 96.7 04/29/2021   PLT 193 04/29/2021      Chemistry      Component Value Date/Time   NA 136 04/29/2021 0052   NA 146 (H) 02/02/2021 1156   K 3.8 04/29/2021 0052   CL 110 04/29/2021 0052   CO2 20 (L) 04/29/2021 0052   BUN 24 (H) 04/29/2021 0052   BUN 23 02/02/2021 1156   CREATININE 1.24 (H) 04/29/2021 0052   CREATININE 1.72 (H) 10/28/2020 1002      Component Value  Date/Time   CALCIUM 8.1 (L) 04/29/2021 0052   ALKPHOS 168 (H) 04/26/2021 1319   AST 22 04/26/2021 1319   AST 21 10/28/2020 1002   ALT 19 04/26/2021 1319   ALT 15 10/28/2020 1002   BILITOT 0.5 04/26/2021 1319   BILITOT <0.2 02/02/2021 1156   BILITOT 0.5 10/28/2020 1002       RADIOGRAPHIC STUDIES: DG Chest 1 View  Result Date: 04/26/2021 CLINICAL DATA:  [place] out of chair, left hip fracture, non-small cell right lung cancer EXAM: CHEST  1 VIEW COMPARISON:  02/26/2021, 10/28/2020 FINDINGS: Single frontal view of the chest demonstrates a stable cardiac silhouette. Stable  right apical mass consistent with biopsy-proven non-small cell lung cancer. Chronic areas of scarring are seen elsewhere within the mid upper lung zones. No new airspace disease, effusion, or pneumothorax. No acute bony abnormalities. IMPRESSION: 1. Stable right apical mass consistent with known biopsy-proven non-small cell lung cancer. 2. No acute intrathoracic trauma. Electronically Signed   By: Randa Ngo M.D.   On: 04/26/2021 15:24   CT Head Wo Contrast  Result Date: 04/26/2021 CLINICAL DATA:  Neck trauma (Age >= 65y); Head trauma, minor (Age >= 65y) EXAM: CT HEAD WITHOUT CONTRAST CT CERVICAL SPINE WITHOUT CONTRAST TECHNIQUE: Multidetector CT imaging of the head and cervical spine was performed following the standard protocol without intravenous contrast. Multiplanar CT image reconstructions of the cervical spine were also generated. COMPARISON:  CT head February 26, 2021. CT cervical spine November 22, 2020. FINDINGS: CT HEAD FINDINGS Brain: No evidence of acute large vascular infarction, hemorrhage, hydrocephalus, extra-axial collection or mass lesion/mass effect. Similar patchy white matter hypoattenuation, nonspecific but compatible with chronic microvascular ischemic disease. Vascular: No hyperdense vessel identified. Calcific intracranial atherosclerosis. Skull: No acute fracture. Sinuses/Orbits: Opacified left anterior  ethmoid air cell. Otherwise, largely clear sinuses. Unremarkable orbits. Other: No mastoid effusions. CT CERVICAL SPINE FINDINGS Motion limited study.  Within this limitation: Alignment: Similar alignment to the prior. Similar anterolisthesis of C6 on C7 with similar focal kyphosis at this level. Of the lower cervical spine dextrocurvature Skull base and vertebrae: Redemonstrated chronic erosions along the base of the odontoid with posterior pannus. There is also severe erosive change along the right atlantoaxial joint, similar. Cystic degenerative change of the dens, similar. C3-C5 ACDF with corpectomy at C4. No evidence of hardware fracture. Similar alignment. Fused facet joints at C3-C4 and C6-C7. New T2 (20%) and T3 (30%) vertebral body height loss with associated superior endplate sclerosis and lucency through the T3 superior endplate. Findings are partially imaged. Soft tissues and spinal canal: Motion limited evaluation without definite prevertebral edema or visible canal hematoma. Disc levels: Severe multilevel degenerative change. Craniocervical degenerative changes described above. Multilevel bilateral foraminal stenosis. Upper chest: Partially imaged nodular opacities in the posterior right upper lobe, reportedly biopsy-proven non-small cell lung cancer IMPRESSION: CT cervical spine: 1. Acute T2 and T3 compression fractures, described above and partially imaged. Recommend dedicated CT of the thoracic spine to further characterize and to fully evaluate the thoracic spine. 2. Similar postoperative changes of C3-C4 ACDF and C4 corpectomy with similar alignment. 3. Severe multilevel degenerative change, including severe craniocervical degenerative change and multilevel foraminal stenosis. MRI could further characterize the canal/cord/foramina if clinically indicated. 4. Partially imaged nodular opacities in the posterior right upper lobe, reportedly biopsy-proven non-small cell lung cancer CT head: No  evidence of acute intracranial abnormality. Electronically Signed   By: Margaretha Sheffield M.D.   On: 04/26/2021 14:41   CT Cervical Spine Wo Contrast  Result Date: 04/26/2021 CLINICAL DATA:  Neck trauma (Age >= 65y); Head trauma, minor (Age >= 65y) EXAM: CT HEAD WITHOUT CONTRAST CT CERVICAL SPINE WITHOUT CONTRAST TECHNIQUE: Multidetector CT imaging of the head and cervical spine was performed following the standard protocol without intravenous contrast. Multiplanar CT image reconstructions of the cervical spine were also generated. COMPARISON:  CT head February 26, 2021. CT cervical spine November 22, 2020. FINDINGS: CT HEAD FINDINGS Brain: No evidence of acute large vascular infarction, hemorrhage, hydrocephalus, extra-axial collection or mass lesion/mass effect. Similar patchy white matter hypoattenuation, nonspecific but compatible with chronic microvascular ischemic disease. Vascular: No hyperdense vessel identified. Calcific intracranial  atherosclerosis. Skull: No acute fracture. Sinuses/Orbits: Opacified left anterior ethmoid air cell. Otherwise, largely clear sinuses. Unremarkable orbits. Other: No mastoid effusions. CT CERVICAL SPINE FINDINGS Motion limited study.  Within this limitation: Alignment: Similar alignment to the prior. Similar anterolisthesis of C6 on C7 with similar focal kyphosis at this level. Of the lower cervical spine dextrocurvature Skull base and vertebrae: Redemonstrated chronic erosions along the base of the odontoid with posterior pannus. There is also severe erosive change along the right atlantoaxial joint, similar. Cystic degenerative change of the dens, similar. C3-C5 ACDF with corpectomy at C4. No evidence of hardware fracture. Similar alignment. Fused facet joints at C3-C4 and C6-C7. New T2 (20%) and T3 (30%) vertebral body height loss with associated superior endplate sclerosis and lucency through the T3 superior endplate. Findings are partially imaged. Soft tissues and spinal  canal: Motion limited evaluation without definite prevertebral edema or visible canal hematoma. Disc levels: Severe multilevel degenerative change. Craniocervical degenerative changes described above. Multilevel bilateral foraminal stenosis. Upper chest: Partially imaged nodular opacities in the posterior right upper lobe, reportedly biopsy-proven non-small cell lung cancer IMPRESSION: CT cervical spine: 1. Acute T2 and T3 compression fractures, described above and partially imaged. Recommend dedicated CT of the thoracic spine to further characterize and to fully evaluate the thoracic spine. 2. Similar postoperative changes of C3-C4 ACDF and C4 corpectomy with similar alignment. 3. Severe multilevel degenerative change, including severe craniocervical degenerative change and multilevel foraminal stenosis. MRI could further characterize the canal/cord/foramina if clinically indicated. 4. Partially imaged nodular opacities in the posterior right upper lobe, reportedly biopsy-proven non-small cell lung cancer CT head: No evidence of acute intracranial abnormality. Electronically Signed   By: Margaretha Sheffield M.D.   On: 04/26/2021 14:41   CT CHEST ABDOMEN PELVIS W CONTRAST  Result Date: 04/26/2021 CLINICAL DATA:  Dementia patient found on the floor after a fall. EXAM: CT CHEST, ABDOMEN, AND PELVIS WITH CONTRAST TECHNIQUE: Multidetector CT imaging of the chest, abdomen and pelvis was performed following the standard protocol during bolus administration of intravenous contrast. CONTRAST:  53mL OMNIPAQUE IOHEXOL 300 MG/ML  SOLN COMPARISON:  10/28/2020 FINDINGS: CT CHEST FINDINGS Cardiovascular: Art size upper limits of normal. No pericardial fluid. Coronary artery calcification and aortic atherosclerotic calcification as seen previously. Mediastinum/Nodes: No mediastinal or hilar mass or lymphadenopathy. Lungs/Pleura: Indistinct mass lesion at the right apex, apparently malignant by biopsy, appears slightly larger.  This is difficult to measure precisely because of the morphology. Elsewhere, 6 mm subsolid nodule previously seen in the superior segment of the left lower lobe is unchanged, axial image 37. No progressive feature. No pleural effusion seen on either side. No evidence of infectious pneumonia or pulmonary collapse. Musculoskeletal: No rib fracture on the right. On the left, there are numerous subacute healing rib fractures. There are subacute but somewhat more recent appearing rib fractures posteriorly affecting the left seventh, eighth and ninth ribs. Old appearing compression deformity at T9. Minor superior endplate fracture at T3, not present in June of this year. Sternal fracture which appears subacute but not united, not present in June of this year. CT ABDOMEN PELVIS FINDINGS Hepatobiliary: No liver parenchymal abnormality. Previous cholecystectomy. Chronic biliary ductal prominence. Pancreas: Negative Spleen: Normal Adrenals/Urinary Tract: Adrenal glands are normal. Multiple renal cysts on the left. Small nonobstructing renal calculi. Bladder is normal. Stomach/Bowel: Previous gastric surgery. Left inguinal hernia containing several loops of small intestine. No evidence of obstruction or incarceration. No acute colon finding. Vascular/Lymphatic: Aortic atherosclerosis. No aneurysm. IVC is normal. No  retroperitoneal adenopathy. Reproductive: No pelvic mass. Other: No free fluid or air. Musculoskeletal: Distant discectomy and fusion procedure L4 to the sacrum. Likely nonunion at L5-S1. subacute fracture of the pubic rami on the right, not yet healed. Acute intertrochanteric fracture of the left femur. IMPRESSION: Slight enlargement of an indistinct irregular mass lesion at the right apex, reported to be malignant by prior biopsy. No change in a 6 mm subsolid nodule in the superior segment of the left lower lobe. Multiple subacute rib fractures on the left of varying ages, not yet healed. No sign of acute fracture  today. Old compression deformity at T9. More recent superior endplate fracture at T3, not present in June of this year, but probably subacute. Subacute sternal fracture, not yet united. No evidence of acute abdominal organ injury. Aortic atherosclerosis. Left inguinal hernia containing small bowel but no evidence of obstruction or incarceration. Subacute pubic rami fractures on the right. Acute intertrochanteric femoral fracture on the left. 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 Electronically Signed   By: Nelson Chimes M.D.   On: 04/26/2021 16:36   CT T-SPINE NO CHARGE  Result Date: 04/26/2021 CLINICAL DATA:  Fall with back pain EXAM: CT THORACIC SPINE WITHOUT CONTRAST TECHNIQUE: Multidetector CT images of the thoracic were obtained using the standard protocol without intravenous contrast. COMPARISON:  Cervical CT 11/22/2020.  CT chest 10/28/2020. FINDINGS: Alignment: Increased kyphotic curvature. Vertebrae: Old compression fracture at T9, not progressed since June. Newly seen superior endplate fractures at T2, T3 and T4, likely subacute. Loss of height 20% or less at those levels. No acute thoracic region fracture identified. Paraspinal and other soft tissues: Negative Disc levels: No significant thoracic degenerative disease. No bony stenosis of the canal or foramina. IMPRESSION: No acute thoracic spine fracture suspected. Old fracture of T9. Likely subacute fractures at T2, T3 and T4, with only minimal loss of height. Electronically Signed   By: Nelson Chimes M.D.   On: 04/26/2021 16:38   CT L-SPINE NO CHARGE  Result Date: 04/26/2021 CLINICAL DATA:  [place].  Assess for lumbar compression fracture. EXAM: CT LUMBAR SPINE WITHOUT CONTRAST TECHNIQUE: Multidetector CT imaging of the lumbar spine was performed without intravenous contrast administration. Multiplanar CT image reconstructions were also generated. COMPARISON:  MRI 02/27/2021 FINDINGS: Segmentation: 5 lumbar type vertebral bodies. Alignment:  Fixed anterolisthesis at L4-5 of 8 mm. Anterolisthesis at L5-S1 of 7 mm. Vertebrae: No lumbar region fracture. Likely solid union at the L4-5 level. Nonunion at L5-S1, with pronounced lucency surrounding the sacral screws and nitrogen gas in the L5-S1 disc level. Paraspinal and other soft tissues: Negative Disc levels: L1-2 and L2-3 are negative. L3-4 shows mild bulging of the disc and facet hypertrophy. See above regarding the surgical region from L4 to the sacrum. IMPRESSION: No acute traumatic finding in the lumbar region. Previous discectomy and fusion procedure from L4 to the sacrum. 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 Electronically Signed   By: Nelson Chimes M.D.   On: 04/26/2021 16:41   DG Knee Left Port  Result Date: 04/26/2021 CLINICAL DATA:  Un witnessed fall, left knee pain EXAM: PORTABLE LEFT KNEE - 1-2 VIEW COMPARISON:  02/06/2006 FINDINGS: Frontal and lateral views of the left knee demonstrate 3 component left knee arthroplasty in the expected position without signs of acute complication. There are no acute displaced fractures. Small joint effusion. Diffuse soft tissue edema. IMPRESSION: 1. No acute displaced fracture.  2. Diffuse soft tissue edema and trace left knee effusion. 3. Unremarkable 3 component left knee arthroplasty. Electronically Signed   By: Randa Ngo M.D.   On: 04/26/2021 17:05   DG C-Arm 1-60 Min-No Report  Result Date: 04/27/2021 CLINICAL DATA:  Surgical internal fixation of left proximal femur fracture. EXAM: OPERATIVE left HIP (WITH PELVIS IF PERFORMED) 5 VIEWS TECHNIQUE: Fluoroscopic spot image(s) were submitted for interpretation post-operatively. Radiation exposure index: 15.52 mGy. COMPARISON:  April 26, 2021. FINDINGS: Five intraoperative fluoroscopic images were obtained of the left hip. These images demonstrate surgical internal fixation of proximal left femoral intertrochanteric  fracture. IMPRESSION: Fluoroscopic guidance provided during surgical internal fixation of proximal left femoral intertrochanteric fracture. Electronically Signed   By: Marijo Conception M.D.   On: 04/27/2021 16:01   DG HIP PORT UNILAT W OR W/O PELVIS 1V LEFT  Result Date: 04/27/2021 CLINICAL DATA:  Hip fracture EXAM: DG HIP (WITH OR WITHOUT PELVIS) 1V PORT LEFT COMPARISON:  04/26/2021 FINDINGS: Interval intramedullary rod and distal screw fixation of the left femur for intertrochanteric fracture. There is anatomic alignment. Gas in the soft tissues consistent with recent surgery. IMPRESSION: Interval surgical fixation of left intertrochanteric fracture with expected postsurgical change Electronically Signed   By: Donavan Foil M.D.   On: 04/27/2021 18:39   DG HIP OPERATIVE UNILAT WITH PELVIS LEFT  Result Date: 04/27/2021 CLINICAL DATA:  Surgical internal fixation of left proximal femur fracture. EXAM: OPERATIVE left HIP (WITH PELVIS IF PERFORMED) 5 VIEWS TECHNIQUE: Fluoroscopic spot image(s) were submitted for interpretation post-operatively. Radiation exposure index: 15.52 mGy. COMPARISON:  April 26, 2021. FINDINGS: Five intraoperative fluoroscopic images were obtained of the left hip. These images demonstrate surgical internal fixation of proximal left femoral intertrochanteric fracture. IMPRESSION: Fluoroscopic guidance provided during surgical internal fixation of proximal left femoral intertrochanteric fracture. Electronically Signed   By: Marijo Conception M.D.   On: 04/27/2021 16:01   DG Hip Unilat With Pelvis 2-3 Views Left  Result Date: 04/26/2021 CLINICAL DATA:  [place], left hip deformity EXAM: DG HIP (WITH OR WITHOUT PELVIS) 2-3V LEFT COMPARISON:  02/27/2021 FINDINGS: Frontal view of the pelvis as well as frontal and cross-table lateral views of the left hip are obtained. There is a displaced intertrochanteric left hip fracture, with mild impaction and varus angulation. No dislocation. There  are fractures through the right superior and inferior pubic rami which are new since prior study. There is some sclerosis along the margins of these fractures and possible periosteal reaction, which could suggest subacute injury. Fracture lines are still readily apparent [facility] for. The bones are diffusely osteopenic. Sacroiliac joints are unremarkable. Stable postsurgical changes at the lumbosacral junction. IMPRESSION: 1. Acute intertrochanteric left hip fracture, with mild impaction and varus angulation. 2. Acute to subacute fractures through the right superior and inferior pubic rami. 3. Diffuse osteopenia. Electronically Signed   By: Randa Ngo M.D.   On: 04/26/2021 15:21     ASSESSMENT AND PLAN: This is a very pleasant 80 years old African-American female with a stage IIa (T2b, N0, M0) non-small cell lung cancer with unspecified histologic subtype diagnosed in January 2022. The patient had a PET scan as well as MRI of the brain performed recently.  The PET scan showed low-level FDG uptake associated with the ill-defined subsolid 4.5 cm posterior apical right upper lobe lung mass compatible with the non-small cell malignancy.  There was no other hypermetabolic activity in the thoracic adenopathy or distant metastatic disease.   The patient underwent  curative radiotherapy under the care of Dr. Sondra Come. The patient is currently on observation and she is feeling fine with no concerning complaints. She had repeat CT scan of the chest, abdomen pelvis during her recent hospitalization and that showed slight change in the right lung mass but no clear evidence for progression. I recommended for her to continue on observation with repeat CT scan of the chest in 6 months. The patient was advised to call immediately if she has any other concerning symptoms in the interval. The patient voices understanding of current disease status and treatment options and is in agreement with the current care plan.  All  questions were answered. The patient knows to call the clinic with any problems, questions or concerns. We can certainly see the patient much sooner if necessary.  Disclaimer: This note was dictated with voice recognition software. Similar sounding words can inadvertently be transcribed and may not be corrected upon review.

## 2021-05-08 ENCOUNTER — Other Ambulatory Visit: Payer: Self-pay

## 2021-05-08 ENCOUNTER — Emergency Department (HOSPITAL_COMMUNITY): Payer: No Typology Code available for payment source

## 2021-05-08 ENCOUNTER — Inpatient Hospital Stay (HOSPITAL_COMMUNITY)
Admission: EM | Admit: 2021-05-08 | Discharge: 2021-05-11 | DRG: 176 | Disposition: A | Discharge status: Skilled Nursing Facility | Payer: No Typology Code available for payment source | Source: Skilled Nursing Facility | Attending: Family Medicine | Admitting: Family Medicine

## 2021-05-08 DIAGNOSIS — Z7982 Long term (current) use of aspirin: Secondary | ICD-10-CM

## 2021-05-08 DIAGNOSIS — F02818 Dementia in other diseases classified elsewhere, unspecified severity, with other behavioral disturbance: Secondary | ICD-10-CM | POA: Diagnosis present

## 2021-05-08 DIAGNOSIS — Z9049 Acquired absence of other specified parts of digestive tract: Secondary | ICD-10-CM

## 2021-05-08 DIAGNOSIS — K219 Gastro-esophageal reflux disease without esophagitis: Secondary | ICD-10-CM | POA: Diagnosis present

## 2021-05-08 DIAGNOSIS — Z8249 Family history of ischemic heart disease and other diseases of the circulatory system: Secondary | ICD-10-CM

## 2021-05-08 DIAGNOSIS — M545 Low back pain, unspecified: Secondary | ICD-10-CM

## 2021-05-08 DIAGNOSIS — J439 Emphysema, unspecified: Secondary | ICD-10-CM | POA: Diagnosis present

## 2021-05-08 DIAGNOSIS — E7800 Pure hypercholesterolemia, unspecified: Secondary | ICD-10-CM | POA: Diagnosis present

## 2021-05-08 DIAGNOSIS — N183 Chronic kidney disease, stage 3 unspecified: Secondary | ICD-10-CM | POA: Diagnosis present

## 2021-05-08 DIAGNOSIS — C3491 Malignant neoplasm of unspecified part of right bronchus or lung: Secondary | ICD-10-CM | POA: Diagnosis present

## 2021-05-08 DIAGNOSIS — Z955 Presence of coronary angioplasty implant and graft: Secondary | ICD-10-CM

## 2021-05-08 DIAGNOSIS — G8929 Other chronic pain: Secondary | ICD-10-CM

## 2021-05-08 DIAGNOSIS — R7989 Other specified abnormal findings of blood chemistry: Secondary | ICD-10-CM

## 2021-05-08 DIAGNOSIS — R0902 Hypoxemia: Secondary | ICD-10-CM

## 2021-05-08 DIAGNOSIS — E538 Deficiency of other specified B group vitamins: Secondary | ICD-10-CM | POA: Diagnosis present

## 2021-05-08 DIAGNOSIS — I10 Essential (primary) hypertension: Secondary | ICD-10-CM | POA: Diagnosis present

## 2021-05-08 DIAGNOSIS — I2510 Atherosclerotic heart disease of native coronary artery without angina pectoris: Secondary | ICD-10-CM | POA: Diagnosis present

## 2021-05-08 DIAGNOSIS — I2699 Other pulmonary embolism without acute cor pulmonale: Secondary | ICD-10-CM | POA: Diagnosis not present

## 2021-05-08 DIAGNOSIS — R0602 Shortness of breath: Secondary | ICD-10-CM | POA: Diagnosis not present

## 2021-05-08 DIAGNOSIS — D638 Anemia in other chronic diseases classified elsewhere: Secondary | ICD-10-CM | POA: Diagnosis present

## 2021-05-08 DIAGNOSIS — Z79891 Long term (current) use of opiate analgesic: Secondary | ICD-10-CM

## 2021-05-08 DIAGNOSIS — I130 Hypertensive heart and chronic kidney disease with heart failure and stage 1 through stage 4 chronic kidney disease, or unspecified chronic kidney disease: Secondary | ICD-10-CM | POA: Diagnosis present

## 2021-05-08 DIAGNOSIS — I5042 Chronic combined systolic (congestive) and diastolic (congestive) heart failure: Secondary | ICD-10-CM | POA: Diagnosis present

## 2021-05-08 DIAGNOSIS — I252 Old myocardial infarction: Secondary | ICD-10-CM

## 2021-05-08 DIAGNOSIS — Z885 Allergy status to narcotic agent status: Secondary | ICD-10-CM

## 2021-05-08 DIAGNOSIS — R079 Chest pain, unspecified: Secondary | ICD-10-CM

## 2021-05-08 DIAGNOSIS — G629 Polyneuropathy, unspecified: Secondary | ICD-10-CM | POA: Diagnosis present

## 2021-05-08 DIAGNOSIS — Z66 Do not resuscitate: Secondary | ICD-10-CM | POA: Diagnosis present

## 2021-05-08 DIAGNOSIS — Z90710 Acquired absence of both cervix and uterus: Secondary | ICD-10-CM

## 2021-05-08 DIAGNOSIS — Z7951 Long term (current) use of inhaled steroids: Secondary | ICD-10-CM

## 2021-05-08 DIAGNOSIS — Z8541 Personal history of malignant neoplasm of cervix uteri: Secondary | ICD-10-CM

## 2021-05-08 DIAGNOSIS — G301 Alzheimer's disease with late onset: Secondary | ICD-10-CM | POA: Diagnosis present

## 2021-05-08 DIAGNOSIS — Z923 Personal history of irradiation: Secondary | ICD-10-CM

## 2021-05-08 DIAGNOSIS — Z20822 Contact with and (suspected) exposure to covid-19: Secondary | ICD-10-CM | POA: Diagnosis present

## 2021-05-08 DIAGNOSIS — F17210 Nicotine dependence, cigarettes, uncomplicated: Secondary | ICD-10-CM | POA: Diagnosis present

## 2021-05-08 DIAGNOSIS — R52 Pain, unspecified: Secondary | ICD-10-CM

## 2021-05-08 DIAGNOSIS — R778 Other specified abnormalities of plasma proteins: Secondary | ICD-10-CM

## 2021-05-08 DIAGNOSIS — Z85118 Personal history of other malignant neoplasm of bronchus and lung: Secondary | ICD-10-CM

## 2021-05-08 DIAGNOSIS — Z888 Allergy status to other drugs, medicaments and biological substances status: Secondary | ICD-10-CM

## 2021-05-08 DIAGNOSIS — I82453 Acute embolism and thrombosis of peroneal vein, bilateral: Secondary | ICD-10-CM | POA: Diagnosis present

## 2021-05-08 DIAGNOSIS — Z79899 Other long term (current) drug therapy: Secondary | ICD-10-CM

## 2021-05-08 DIAGNOSIS — N1831 Chronic kidney disease, stage 3a: Secondary | ICD-10-CM | POA: Diagnosis present

## 2021-05-08 LAB — COMPREHENSIVE METABOLIC PANEL
ALT: 18 U/L (ref 0–44)
AST: 22 U/L (ref 15–41)
Albumin: 2.6 g/dL — ABNORMAL LOW (ref 3.5–5.0)
Alkaline Phosphatase: 205 U/L — ABNORMAL HIGH (ref 38–126)
Anion gap: 6 (ref 5–15)
BUN: 31 mg/dL — ABNORMAL HIGH (ref 8–23)
CO2: 18 mmol/L — ABNORMAL LOW (ref 22–32)
Calcium: 8.7 mg/dL — ABNORMAL LOW (ref 8.9–10.3)
Chloride: 112 mmol/L — ABNORMAL HIGH (ref 98–111)
Creatinine, Ser: 1.27 mg/dL — ABNORMAL HIGH (ref 0.44–1.00)
GFR, Estimated: 42 mL/min — ABNORMAL LOW (ref >60)
Glucose, Bld: 102 mg/dL — ABNORMAL HIGH (ref 70–99)
Potassium: 4.2 mmol/L (ref 3.5–5.1)
Sodium: 136 mmol/L (ref 135–145)
Total Bilirubin: 0.5 mg/dL (ref 0.3–1.2)
Total Protein: 6.3 g/dL — ABNORMAL LOW (ref 6.5–8.1)

## 2021-05-08 LAB — I-STAT VENOUS BLOOD GAS, ED
Acid-base deficit: 8 mmol/L — ABNORMAL HIGH (ref 0.0–2.0)
Bicarbonate: 17.7 mmol/L — ABNORMAL LOW (ref 20.0–28.0)
Calcium, Ion: 1.26 mmol/L (ref 1.15–1.40)
HCT: 31 % — ABNORMAL LOW (ref 36.0–46.0)
Hemoglobin: 10.5 g/dL — ABNORMAL LOW (ref 12.0–15.0)
O2 Saturation: 72 %
Potassium: 4.2 mmol/L (ref 3.5–5.1)
Sodium: 140 mmol/L (ref 135–145)
TCO2: 19 mmol/L — ABNORMAL LOW (ref 22–32)
pCO2, Ven: 36.4 mmHg — ABNORMAL LOW (ref 44.0–60.0)
pH, Ven: 7.294 (ref 7.250–7.430)
pO2, Ven: 42 mmHg (ref 32.0–45.0)

## 2021-05-08 LAB — CBC WITH DIFFERENTIAL/PLATELET
Abs Immature Granulocytes: 0.03 10*3/uL (ref 0.00–0.07)
Basophils Absolute: 0 10*3/uL (ref 0.0–0.1)
Basophils Relative: 0 %
Eosinophils Absolute: 0.1 10*3/uL (ref 0.0–0.5)
Eosinophils Relative: 1 %
HCT: 31 % — ABNORMAL LOW (ref 36.0–46.0)
Hemoglobin: 9 g/dL — ABNORMAL LOW (ref 12.0–15.0)
Immature Granulocytes: 1 %
Lymphocytes Relative: 15 %
Lymphs Abs: 0.8 10*3/uL (ref 0.7–4.0)
MCH: 29.4 pg (ref 26.0–34.0)
MCHC: 29 g/dL — ABNORMAL LOW (ref 30.0–36.0)
MCV: 101.3 fL — ABNORMAL HIGH (ref 80.0–100.0)
Monocytes Absolute: 0.6 10*3/uL (ref 0.1–1.0)
Monocytes Relative: 12 %
Neutro Abs: 3.7 10*3/uL (ref 1.7–7.7)
Neutrophils Relative %: 71 %
Platelets: 467 10*3/uL — ABNORMAL HIGH (ref 150–400)
RBC: 3.06 MIL/uL — ABNORMAL LOW (ref 3.87–5.11)
RDW: 17.9 % — ABNORMAL HIGH (ref 11.5–15.5)
WBC: 5.2 10*3/uL (ref 4.0–10.5)
nRBC: 0 % (ref 0.0–0.2)

## 2021-05-08 LAB — RESP PANEL BY RT-PCR (FLU A&B, COVID) ARPGX2
Influenza A by PCR: NEGATIVE
Influenza B by PCR: NEGATIVE
SARS Coronavirus 2 by RT PCR: NEGATIVE

## 2021-05-08 LAB — BRAIN NATRIURETIC PEPTIDE: B Natriuretic Peptide: 82 pg/mL (ref 0.0–100.0)

## 2021-05-08 LAB — TROPONIN I (HIGH SENSITIVITY)
Troponin I (High Sensitivity): 215 ng/L (ref <18)
Troponin I (High Sensitivity): 228 ng/L (ref <18)

## 2021-05-08 LAB — LACTIC ACID, PLASMA: Lactic Acid, Venous: 1.9 mmol/L (ref 0.5–1.9)

## 2021-05-08 NOTE — ED Provider Notes (Signed)
23:50: Assumed care of patient from [place] at change of shift pending CT angio and disposition.  Please see prior provider note for full H&P.  Briefly patient is an 80 year old female with a history of lung cancer, CAD, CHF, HTN, hyperlipidemia who presented to the ED with complaints of chest pain & dyspnea that started today. Had recent surgery for hip fx 04/27/21.  Per nursing care facility patient was hypoxic into the 70s therefore they applied 4 L nasal cannula.  While in the emergency department patient has been transitioned to room air and is maintaining SPO2 greater than 94%.  Physical Exam  BP 132/67    Pulse 76    Temp 98.5 F (36.9 C) (Oral)    Resp 19    SpO2 100%   Physical Exam Vitals and nursing note reviewed.  Constitutional:      General: She is not in acute distress. HENT:     Head: Normocephalic and atraumatic.  Eyes:     General:        Right eye: No discharge.        Left eye: No discharge.     Conjunctiva/sclera: Conjunctivae normal.  Pulmonary:     Effort: No respiratory distress.  Neurological:     Mental Status: She is alert.     Comments: Clear speech.   Psychiatric:        Behavior: Behavior normal.        Thought Content: Thought content normal.    ED Course/Procedures   Results for orders placed or performed during the hospital encounter of 05/08/21  Resp Panel by RT-PCR (Flu A&B, Covid) Nasopharyngeal Swab   Specimen: Nasopharyngeal Swab; Nasopharyngeal(NP) swabs in vial transport medium  Result Value Ref Range   SARS Coronavirus 2 by RT PCR NEGATIVE NEGATIVE   Influenza A by PCR NEGATIVE NEGATIVE   Influenza B by PCR NEGATIVE NEGATIVE  CBC with Differential  Result Value Ref Range   WBC 5.2 4.0 - 10.5 K/uL   RBC 3.06 (L) 3.87 - 5.11 MIL/uL   Hemoglobin 9.0 (L) 12.0 - 15.0 g/dL   HCT 31.0 (L) 36.0 - 46.0 %   MCV 101.3 (H) 80.0 - 100.0 fL   MCH 29.4 26.0 - 34.0 pg   MCHC 29.0 (L) 30.0 - 36.0 g/dL   RDW 17.9 (H) 11.5 - 15.5 %   Platelets  467 (H) 150 - 400 K/uL   nRBC 0.0 0.0 - 0.2 %   Neutrophils Relative % 71 %   Neutro Abs 3.7 1.7 - 7.7 K/uL   Lymphocytes Relative 15 %   Lymphs Abs 0.8 0.7 - 4.0 K/uL   Monocytes Relative 12 %   Monocytes Absolute 0.6 0.1 - 1.0 K/uL   Eosinophils Relative 1 %   Eosinophils Absolute 0.1 0.0 - 0.5 K/uL   Basophils Relative 0 %   Basophils Absolute 0.0 0.0 - 0.1 K/uL   Immature Granulocytes 1 %   Abs Immature Granulocytes 0.03 0.00 - 0.07 K/uL  Brain natriuretic peptide  Result Value Ref Range   B Natriuretic Peptide 82.0 0.0 - 100.0 pg/mL  Comprehensive metabolic panel  Result Value Ref Range   Sodium 136 135 - 145 mmol/L   Potassium 4.2 3.5 - 5.1 mmol/L   Chloride 112 (H) 98 - 111 mmol/L   CO2 18 (L) 22 - 32 mmol/L   Glucose, Bld 102 (H) 70 - 99 mg/dL   BUN 31 (H) 8 - 23 mg/dL   Creatinine, Ser 1.27 (  H) 0.44 - 1.00 mg/dL   Calcium 8.7 (L) 8.9 - 10.3 mg/dL   Total Protein 6.3 (L) 6.5 - 8.1 g/dL   Albumin 2.6 (L) 3.5 - 5.0 g/dL   AST 22 15 - 41 U/L   ALT 18 0 - 44 U/L   Alkaline Phosphatase 205 (H) 38 - 126 U/L   Total Bilirubin 0.5 0.3 - 1.2 mg/dL   GFR, Estimated 42 (L) >60 mL/min   Anion gap 6 5 - 15  Lactic acid, plasma  Result Value Ref Range   Lactic Acid, Venous 1.9 0.5 - 1.9 mmol/L  I-Stat venous blood gas, ED  Result Value Ref Range   pH, Ven 7.294 7.250 - 7.430   pCO2, Ven 36.4 (L) 44.0 - 60.0 mmHg   pO2, Ven 42.0 32.0 - 45.0 mmHg   Bicarbonate 17.7 (L) 20.0 - 28.0 mmol/L   TCO2 19 (L) 22 - 32 mmol/L   O2 Saturation 72.0 %   Acid-base deficit 8.0 (H) 0.0 - 2.0 mmol/L   Sodium 140 135 - 145 mmol/L   Potassium 4.2 3.5 - 5.1 mmol/L   Calcium, Ion 1.26 1.15 - 1.40 mmol/L   HCT 31.0 (L) 36.0 - 46.0 %   Hemoglobin 10.5 (L) 12.0 - 15.0 g/dL   Sample type VENOUS   Troponin I (High Sensitivity)  Result Value Ref Range   Troponin I (High Sensitivity) 215 (HH) <18 ng/L  Troponin I (High Sensitivity)  Result Value Ref Range   Troponin I (High Sensitivity) 228  (HH) <18 ng/L   DG Chest 1 View  Result Date: 04/26/2021 CLINICAL DATA:  [place] out of chair, left hip fracture, non-small cell right lung cancer EXAM: CHEST  1 VIEW COMPARISON:  02/26/2021, 10/28/2020 FINDINGS: Single frontal view of the chest demonstrates a stable cardiac silhouette. Stable right apical mass consistent with biopsy-proven non-small cell lung cancer. Chronic areas of scarring are seen elsewhere within the mid upper lung zones. No new airspace disease, effusion, or pneumothorax. No acute bony abnormalities. IMPRESSION: 1. Stable right apical mass consistent with known biopsy-proven non-small cell lung cancer. 2. No acute intrathoracic trauma. Electronically Signed   By: Randa Ngo M.D.   On: 04/26/2021 15:24   CT Head Wo Contrast  Result Date: 04/26/2021 CLINICAL DATA:  Neck trauma (Age >= 65y); Head trauma, minor (Age >= 65y) EXAM: CT HEAD WITHOUT CONTRAST CT CERVICAL SPINE WITHOUT CONTRAST TECHNIQUE: Multidetector CT imaging of the head and cervical spine was performed following the standard protocol without intravenous contrast. Multiplanar CT image reconstructions of the cervical spine were also generated. COMPARISON:  CT head February 26, 2021. CT cervical spine November 22, 2020. FINDINGS: CT HEAD FINDINGS Brain: No evidence of acute large vascular infarction, hemorrhage, hydrocephalus, extra-axial collection or mass lesion/mass effect. Similar patchy white matter hypoattenuation, nonspecific but compatible with chronic microvascular ischemic disease. Vascular: No hyperdense vessel identified. Calcific intracranial atherosclerosis. Skull: No acute fracture. Sinuses/Orbits: Opacified left anterior ethmoid air cell. Otherwise, largely clear sinuses. Unremarkable orbits. Other: No mastoid effusions. CT CERVICAL SPINE FINDINGS Motion limited study.  Within this limitation: Alignment: Similar alignment to the prior. Similar anterolisthesis of C6 on C7 with similar focal kyphosis at this  level. Of the lower cervical spine dextrocurvature Skull base and vertebrae: Redemonstrated chronic erosions along the base of the odontoid with posterior pannus. There is also severe erosive change along the right atlantoaxial joint, similar. Cystic degenerative change of the dens, similar. C3-C5 ACDF with corpectomy at C4. No evidence of hardware  fracture. Similar alignment. Fused facet joints at C3-C4 and C6-C7. New T2 (20%) and T3 (30%) vertebral body height loss with associated superior endplate sclerosis and lucency through the T3 superior endplate. Findings are partially imaged. Soft tissues and spinal canal: Motion limited evaluation without definite prevertebral edema or visible canal hematoma. Disc levels: Severe multilevel degenerative change. Craniocervical degenerative changes described above. Multilevel bilateral foraminal stenosis. Upper chest: Partially imaged nodular opacities in the posterior right upper lobe, reportedly biopsy-proven non-small cell lung cancer IMPRESSION: CT cervical spine: 1. Acute T2 and T3 compression fractures, described above and partially imaged. Recommend dedicated CT of the thoracic spine to further characterize and to fully evaluate the thoracic spine. 2. Similar postoperative changes of C3-C4 ACDF and C4 corpectomy with similar alignment. 3. Severe multilevel degenerative change, including severe craniocervical degenerative change and multilevel foraminal stenosis. MRI could further characterize the canal/cord/foramina if clinically indicated. 4. Partially imaged nodular opacities in the posterior right upper lobe, reportedly biopsy-proven non-small cell lung cancer CT head: No evidence of acute intracranial abnormality. Electronically Signed   By: Margaretha Sheffield M.D.   On: 04/26/2021 14:41   CT Angio Chest PE W and/or Wo Contrast  Result Date: 05/09/2021 CLINICAL DATA:  Chest pain and hypoxia, initial encounter EXAM: CT ANGIOGRAPHY CHEST WITH CONTRAST TECHNIQUE:  Multidetector CT imaging of the chest was performed using the standard protocol during bolus administration of intravenous contrast. Multiplanar CT image reconstructions and MIPs were obtained to evaluate the vascular anatomy. CONTRAST:  45mL Isovue 370 COMPARISON:  Chest x-ray from earlier in the same day, 04/26/2021. FINDINGS: Cardiovascular: Thoracic aorta demonstrates atherosclerotic calcifications. No aneurysmal dilatation or dissection is noted. Coronary calcifications are seen. No cardiac enlargement is noted. The pulmonary artery is well visualized within normal branching pattern. Filling defects are noted within the left lower lobe pulmonary arterial branch medially as well as within the right lower lobe pulmonary artery consistent with multifocal pulmonary emboli. No right heart strain is noted. Mediastinum/Nodes: Thoracic inlet is within normal limits. No sizable hilar or mediastinal adenopathy is noted. The esophagus as visualized is within normal limits. Lungs/Pleura: Lungs are well aerated bilaterally. Persistent right apical mass lesion is noted stable from the recent exam. No new focal infiltrate or effusion is seen. Mild emphysematous changes are noted. Upper Abdomen: Postsurgical changes are noted at the GE junction. The remainder of the upper abdomen is within normal limits. Musculoskeletal: Degenerative changes of the thoracic spine are noted. Chronic compression deformities are noted at T3 and T9. Distal sternal fracture is again noted and stable. Old healing rib fractures are noted on the left. No acute rib abnormality is noted. Review of the MIP images confirms the above findings. IMPRESSION: Bilateral pulmonary emboli in the lower lobes without evidence of right heart strain. Stable right apical mass consistent with the known history of lung carcinoma. No acute abnormality is noted. Aortic Atherosclerosis (ICD10-I70.0) and Emphysema (ICD10-J43.9). Critical Value/emergent results were called  by telephone at the time of interpretation on 05/09/2021 at 12:57 am to [facility] , who verbally acknowledged these results. Electronically Signed   By: Inez Catalina M.D.   On: 05/09/2021 01:00   CT Cervical Spine Wo Contrast  Result Date: 04/26/2021 CLINICAL DATA:  Neck trauma (Age >= 65y); Head trauma, minor (Age >= 65y) EXAM: CT HEAD WITHOUT CONTRAST CT CERVICAL SPINE WITHOUT CONTRAST TECHNIQUE: Multidetector CT imaging of the head and cervical spine was performed following the standard protocol without intravenous contrast. Multiplanar CT image reconstructions of  the cervical spine were also generated. COMPARISON:  CT head February 26, 2021. CT cervical spine November 22, 2020. FINDINGS: CT HEAD FINDINGS Brain: No evidence of acute large vascular infarction, hemorrhage, hydrocephalus, extra-axial collection or mass lesion/mass effect. Similar patchy white matter hypoattenuation, nonspecific but compatible with chronic microvascular ischemic disease. Vascular: No hyperdense vessel identified. Calcific intracranial atherosclerosis. Skull: No acute fracture. Sinuses/Orbits: Opacified left anterior ethmoid air cell. Otherwise, largely clear sinuses. Unremarkable orbits. Other: No mastoid effusions. CT CERVICAL SPINE FINDINGS Motion limited study.  Within this limitation: Alignment: Similar alignment to the prior. Similar anterolisthesis of C6 on C7 with similar focal kyphosis at this level. Of the lower cervical spine dextrocurvature Skull base and vertebrae: Redemonstrated chronic erosions along the base of the odontoid with posterior pannus. There is also severe erosive change along the right atlantoaxial joint, similar. Cystic degenerative change of the dens, similar. C3-C5 ACDF with corpectomy at C4. No evidence of hardware fracture. Similar alignment. Fused facet joints at C3-C4 and C6-C7. New T2 (20%) and T3 (30%) vertebral body height loss with associated superior endplate sclerosis and lucency  through the T3 superior endplate. Findings are partially imaged. Soft tissues and spinal canal: Motion limited evaluation without definite prevertebral edema or visible canal hematoma. Disc levels: Severe multilevel degenerative change. Craniocervical degenerative changes described above. Multilevel bilateral foraminal stenosis. Upper chest: Partially imaged nodular opacities in the posterior right upper lobe, reportedly biopsy-proven non-small cell lung cancer IMPRESSION: CT cervical spine: 1. Acute T2 and T3 compression fractures, described above and partially imaged. Recommend dedicated CT of the thoracic spine to further characterize and to fully evaluate the thoracic spine. 2. Similar postoperative changes of C3-C4 ACDF and C4 corpectomy with similar alignment. 3. Severe multilevel degenerative change, including severe craniocervical degenerative change and multilevel foraminal stenosis. MRI could further characterize the canal/cord/foramina if clinically indicated. 4. Partially imaged nodular opacities in the posterior right upper lobe, reportedly biopsy-proven non-small cell lung cancer CT head: No evidence of acute intracranial abnormality. Electronically Signed   By: Margaretha Sheffield M.D.   On: 04/26/2021 14:41   CT CHEST ABDOMEN PELVIS W CONTRAST  Result Date: 04/26/2021 CLINICAL DATA:  Dementia patient found on the floor after a fall. EXAM: CT CHEST, ABDOMEN, AND PELVIS WITH CONTRAST TECHNIQUE: Multidetector CT imaging of the chest, abdomen and pelvis was performed following the standard protocol during bolus administration of intravenous contrast. CONTRAST:  64mL OMNIPAQUE IOHEXOL 300 MG/ML  SOLN COMPARISON:  10/28/2020 FINDINGS: CT CHEST FINDINGS Cardiovascular: Art size upper limits of normal. No pericardial fluid. Coronary artery calcification and aortic atherosclerotic calcification as seen previously. Mediastinum/Nodes: No mediastinal or hilar mass or lymphadenopathy. Lungs/Pleura: Indistinct  mass lesion at the right apex, apparently malignant by biopsy, appears slightly larger. This is difficult to measure precisely because of the morphology. Elsewhere, 6 mm subsolid nodule previously seen in the superior segment of the left lower lobe is unchanged, axial image 37. No progressive feature. No pleural effusion seen on either side. No evidence of infectious pneumonia or pulmonary collapse. Musculoskeletal: No rib fracture on the right. On the left, there are numerous subacute healing rib fractures. There are subacute but somewhat more recent appearing rib fractures posteriorly affecting the left seventh, eighth and ninth ribs. Old appearing compression deformity at T9. Minor superior endplate fracture at T3, not present in June of this year. Sternal fracture which appears subacute but not united, not present in June of this year. CT ABDOMEN PELVIS FINDINGS Hepatobiliary: No liver parenchymal abnormality. Previous cholecystectomy.  Chronic biliary ductal prominence. Pancreas: Negative Spleen: Normal Adrenals/Urinary Tract: Adrenal glands are normal. Multiple renal cysts on the left. Small nonobstructing renal calculi. Bladder is normal. Stomach/Bowel: Previous gastric surgery. Left inguinal hernia containing several loops of small intestine. No evidence of obstruction or incarceration. No acute colon finding. Vascular/Lymphatic: Aortic atherosclerosis. No aneurysm. IVC is normal. No retroperitoneal adenopathy. Reproductive: No pelvic mass. Other: No free fluid or air. Musculoskeletal: Distant discectomy and fusion procedure L4 to the sacrum. Likely nonunion at L5-S1. subacute fracture of the pubic rami on the right, not yet healed. Acute intertrochanteric fracture of the left femur. IMPRESSION: Slight enlargement of an indistinct irregular mass lesion at the right apex, reported to be malignant by prior biopsy. No change in a 6 mm subsolid nodule in the superior segment of the left lower lobe. Multiple  subacute rib fractures on the left of varying ages, not yet healed. No sign of acute fracture today. Old compression deformity at T9. More recent superior endplate fracture at T3, not present in June of this year, but probably subacute. Subacute sternal fracture, not yet united. No evidence of acute abdominal organ injury. Aortic atherosclerosis. Left inguinal hernia containing small bowel but no evidence of obstruction or incarceration. Subacute pubic rami fractures on the right. Acute intertrochanteric femoral fracture on the left. 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 Electronically Signed   By: Nelson Chimes M.D.   On: 04/26/2021 16:36   CT T-SPINE NO CHARGE  Result Date: 04/26/2021 CLINICAL DATA:  Fall with back pain EXAM: CT THORACIC SPINE WITHOUT CONTRAST TECHNIQUE: Multidetector CT images of the thoracic were obtained using the standard protocol without intravenous contrast. COMPARISON:  Cervical CT 11/22/2020.  CT chest 10/28/2020. FINDINGS: Alignment: Increased kyphotic curvature. Vertebrae: Old compression fracture at T9, not progressed since June. Newly seen superior endplate fractures at T2, T3 and T4, likely subacute. Loss of height 20% or less at those levels. No acute thoracic region fracture identified. Paraspinal and other soft tissues: Negative Disc levels: No significant thoracic degenerative disease. No bony stenosis of the canal or foramina. IMPRESSION: No acute thoracic spine fracture suspected. Old fracture of T9. Likely subacute fractures at T2, T3 and T4, with only minimal loss of height. Electronically Signed   By: Nelson Chimes M.D.   On: 04/26/2021 16:38   CT L-SPINE NO CHARGE  Result Date: 04/26/2021 CLINICAL DATA:  [place].  Assess for lumbar compression fracture. EXAM: CT LUMBAR SPINE WITHOUT CONTRAST TECHNIQUE: Multidetector CT imaging of the lumbar spine was performed without intravenous contrast administration. Multiplanar CT image reconstructions were also generated.  COMPARISON:  MRI 02/27/2021 FINDINGS: Segmentation: 5 lumbar type vertebral bodies. Alignment: Fixed anterolisthesis at L4-5 of 8 mm. Anterolisthesis at L5-S1 of 7 mm. Vertebrae: No lumbar region fracture. Likely solid union at the L4-5 level. Nonunion at L5-S1, with pronounced lucency surrounding the sacral screws and nitrogen gas in the L5-S1 disc level. Paraspinal and other soft tissues: Negative Disc levels: L1-2 and L2-3 are negative. L3-4 shows mild bulging of the disc and facet hypertrophy. See above regarding the surgical region from L4 to the sacrum. IMPRESSION: No acute traumatic finding in the lumbar region. Previous discectomy and fusion procedure from L4 to the sacrum. 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 Electronically Signed   By: Nelson Chimes M.D.   On: 04/26/2021 16:41   DG Chest Port 1 View  Result Date: 05/08/2021 CLINICAL DATA:  Dyspnea, chest pain, shortness of breath, history CHF, COPD, coronary artery disease post MI, hypertension, smoker EXAM: PORTABLE CHEST 1 VIEW COMPARISON:  Portable exam 2021 hours compared to 04/26/2021 FINDINGS: Upper normal heart size. Mediastinal contours and pulmonary vascularity normal. Persistent RIGHT apical mass, by prior report a biopsy-proven non-small cell lung cancer. Skin folds project over the chest bilaterally. Chronic accentuation of RIGHT upper lobe markings. No acute infiltrate, pleural effusion or pneumothorax. RIGHT shoulder prosthesis and advanced LEFT glenohumeral degenerative changes noted. IMPRESSION: Known RIGHT apex lung cancer. Chronic lung changes without acute abnormalities. Electronically Signed   By: Lavonia Dana M.D.   On: 05/08/2021 20:52   DG Knee Left Port  Result Date: 04/26/2021 CLINICAL DATA:  Un witnessed fall, left knee pain EXAM: PORTABLE LEFT KNEE - 1-2 VIEW COMPARISON:  02/06/2006 FINDINGS: Frontal and lateral views of the left knee demonstrate  3 component left knee arthroplasty in the expected position without signs of acute complication. There are no acute displaced fractures. Small joint effusion. Diffuse soft tissue edema. IMPRESSION: 1. No acute displaced fracture. 2. Diffuse soft tissue edema and trace left knee effusion. 3. Unremarkable 3 component left knee arthroplasty. Electronically Signed   By: Randa Ngo M.D.   On: 04/26/2021 17:05   DG C-Arm 1-60 Min-No Report  Result Date: 04/27/2021 CLINICAL DATA:  Surgical internal fixation of left proximal femur fracture. EXAM: OPERATIVE left HIP (WITH PELVIS IF PERFORMED) 5 VIEWS TECHNIQUE: Fluoroscopic spot image(s) were submitted for interpretation post-operatively. Radiation exposure index: 15.52 mGy. COMPARISON:  April 26, 2021. FINDINGS: Five intraoperative fluoroscopic images were obtained of the left hip. These images demonstrate surgical internal fixation of proximal left femoral intertrochanteric fracture. IMPRESSION: Fluoroscopic guidance provided during surgical internal fixation of proximal left femoral intertrochanteric fracture. Electronically Signed   By: Marijo Conception M.D.   On: 04/27/2021 16:01   DG HIP PORT UNILAT W OR W/O PELVIS 1V LEFT  Result Date: 04/27/2021 CLINICAL DATA:  Hip fracture EXAM: DG HIP (WITH OR WITHOUT PELVIS) 1V PORT LEFT COMPARISON:  04/26/2021 FINDINGS: Interval intramedullary rod and distal screw fixation of the left femur for intertrochanteric fracture. There is anatomic alignment. Gas in the soft tissues consistent with recent surgery. IMPRESSION: Interval surgical fixation of left intertrochanteric fracture with expected postsurgical change Electronically Signed   By: Donavan Foil M.D.   On: 04/27/2021 18:39   DG HIP OPERATIVE UNILAT WITH PELVIS LEFT  Result Date: 04/27/2021 CLINICAL DATA:  Surgical internal fixation of left proximal femur fracture. EXAM: OPERATIVE left HIP (WITH PELVIS IF PERFORMED) 5 VIEWS TECHNIQUE: Fluoroscopic spot  image(s) were submitted for interpretation post-operatively. Radiation exposure index: 15.52 mGy. COMPARISON:  April 26, 2021. FINDINGS: Five intraoperative fluoroscopic images were obtained of the left hip. These images demonstrate surgical internal fixation of proximal left femoral intertrochanteric fracture. IMPRESSION: Fluoroscopic guidance provided during surgical internal fixation of proximal left femoral intertrochanteric fracture. Electronically Signed   By: Marijo Conception M.D.   On: 04/27/2021 16:01   DG Hip Unilat With Pelvis 2-3 Views Left  Result Date: 04/26/2021 CLINICAL DATA:  [place], left hip deformity EXAM: DG HIP (WITH OR WITHOUT PELVIS) 2-3V LEFT COMPARISON:  02/27/2021 FINDINGS: Frontal view of the pelvis as well as frontal and cross-table lateral views of the left hip are obtained. There is a displaced intertrochanteric left hip fracture, with mild impaction and varus angulation. No dislocation. There are fractures through the right superior and inferior pubic rami which are new since prior study. There is  some sclerosis along the margins of these fractures and possible periosteal reaction, which could suggest subacute injury. Fracture lines are still readily apparent [facility] for. The bones are diffusely osteopenic. Sacroiliac joints are unremarkable. Stable postsurgical changes at the lumbosacral junction. IMPRESSION: 1. Acute intertrochanteric left hip fracture, with mild impaction and varus angulation. 2. Acute to subacute fractures through the right superior and inferior pubic rami. 3. Diffuse osteopenia. Electronically Signed   By: Randa Ngo M.D.   On: 04/26/2021 15:21      .Critical Care Performed by: Amaryllis Dyke, PA-C Authorized by: Amaryllis Dyke, PA-C   CRITICAL CARE Performed by: Kennith Maes   Total critical care time: 30 minutes  Critical care time was exclusive of separately billable procedures and treating other patients.  Critical  care was necessary to treat or prevent imminent or life-threatening deterioration.  Critical care was time spent personally by me on the following activities: development of treatment plan with patient and/or surrogate as well as nursing, discussions with consultants, evaluation of patient's response to treatment, examination of patient, obtaining history from patient or surrogate, ordering and performing treatments and interventions, ordering and review of laboratory studies, ordering and review of radiographic studies, pulse oximetry and re-evaluation of patient's condition.  MDM    01:01: CONSULT: Discussed w/ radiology- bilateral Pes, no radiographic evidence of heart strain.   On reassessment patient is resting comfortably, sats 96% on room air without findings of respiratory distress.  Patient does not think that she had problems with bleeding previously.  01:18: Discussed w/ patient's son who also reports that he does not believe the patient has had problems with bleeding previously.  Updated on results and plan of care.  Heparin initiated.  Will discuss with hospitalist service for admission.  01:31: CONSULT: Discussed with hospitalist Dr. Loralyn Freshwater, Glynda Jaeger, PA-C 05/09/21 0248    Orpah Greek, MD 05/09/21 [phone]

## 2021-05-08 NOTE — ED Provider Notes (Signed)
[facility] EMERGENCY DEPARTMENT Provider Note   CSN: [id number] Arrival date & time: 05/08/21  1923     History Chief Complaint  Patient presents with   Shortness of Breath   Chest Pain    Sara Fox is a 80 y.o. female with past medical history of cervical cancer, lung cancer, Alzheimer's dementia, CKD, CHF, CAD, hypertension, hyperlipidemia, who presents today for evaluation of chest pain and shortness of breath. Patient reports that she has had chest pain since noon.  She states that "bad."  She states that in the middle of her chest and does not radiate or move.  According to triage note patient has been at [facility] for 3 days, they reported that her oxygen sat was 70s to 80s on room air and placed on 4 L nasal cannula.  EMS reports that brought her oxygen up to 98%.  She does not normally wear oxygen.  Patient denies any pain other than her chest.  Level 5 caveat dementia.  Later on patient's son arrived.  I spoke with her son, he states that he was with her at about 7 PM, she was eating and started having some chest pain and shortness of breath.  She continued to eat however had labored breathing suddenly.  She does not normally wear oxygen.  He feels like mentally she is at her normal baseline.  She was able to be weaned down to 2 L nasal cannula by EMS/RN prior to my evaluation.  With patient's son at bedside I did discuss CODE STATUS.  Patient's son is primary Education officer, community, however patient states that she would not want CPR or intubation. Her son is in agreement with this and says she has advanced directive but that was not sent with her from SNF.  This is consistent with code status listed from previous three admissions.   HPI     Past Medical History:  Diagnosis Date   Acute kidney injury ([place])    Back pain    Cervical cancer (HCC)    CHF (congestive heart failure) ([place])    Closed fracture of left distal radius 05/28/2017   COPD  (chronic obstructive pulmonary disease) (HCC)    Coronary artery disease    s/p DES x2 to RCA 2016   GERD (gastroesophageal reflux disease)    History of radiation therapy 07/27/20-08/16/20   Right lung- SBRT- Dr. Gery Pray    Hyperlipidemia    Hypertension    Lower leg edema 12/21/2016   Mild cognitive impairment    Myocardial infarction [facility])    Neck pain    Rhabdomyolysis    Stomach problems     Patient Active Problem List   Diagnosis Date Noted   Closed left hip fracture, initial encounter ([place]) 04/26/2021   Gait disturbance 02/27/2021   GERD (gastroesophageal reflux disease) 02/27/2021   Ambulatory dysfunction 02/27/2021   Transaminitis 02/27/2021   Fall (on)(from) incline, initial encounter 11/23/2020   Macrocytosis without anemia 11/22/2020   Unwitnessed fall 11/22/2020   Late onset Alzheimer's dementia with behavioral disturbance ([place]) 11/18/2020   Visual hallucination 11/18/2020   Sundowning 11/18/2020   Non-small cell carcinoma of right lung, stage 1 ([place]) 06/08/2020   Pulmonary nodule 1 cm or greater in diameter 05/21/2020   Atherosclerotic heart disease of native coronary artery with other forms of angina pectoris ([place]) 04/22/2020   Memory loss 02/18/2019   Vitamin B12 deficiency 10/23/2018   Stage 3 chronic kidney disease ([place]) 06/03/2018   Chronic  anemia 06/03/2018   Hypercholesterolemia 06/03/2018   Malnutrition of moderate degree 07/12/2017   Hypoalbuminemia 07/11/2017   Elevated LFTs 07/11/2017   Snoring 12/21/2016   Peripheral musculoskeletal gait disorder 02/05/2015   Lumbar post-laminectomy syndrome 02/05/2015   CAD in native artery 10/16/2014   NSTEMI (non-ST elevated myocardial infarction) ([place]) 09/13/2014   Cardiomyopathy, ischemic 55/73/2202   Chronic systolic heart failure ([place])    Tobacco abuse 09/12/2014   Dyslipidemia 09/12/2014   Normocytic anemia 09/12/2014   Chronic diastolic heart failure, NYHA class 1 ([name]) 09/12/2014   Postlaminectomy  syndrome, cervical region 08/01/2013   Chronic midline low back pain with left-sided sciatica 08/01/2013   Shoulder joint contracture 08/01/2013   HTN (hypertension) 03/03/2013   Abnormal genetic test 03/03/2013    Past Surgical History:  Procedure Laterality Date   ABDOMINAL HYSTERECTOMY     BACK SURGERY  2012   lower back   BRONCHIAL BIOPSY  05/25/2020   Procedure: BRONCHIAL BIOPSIES;  Surgeon: Collene Gobble, MD;  Location: [facility] ENDOSCOPY;  Service: Pulmonary;;   BRONCHIAL BRUSHINGS  05/25/2020   Procedure: BRONCHIAL BRUSHINGS;  Surgeon: Collene Gobble, MD;  Location: [facility] ENDOSCOPY;  Service: Pulmonary;;   BRONCHIAL NEEDLE ASPIRATION BIOPSY  05/25/2020   Procedure: BRONCHIAL NEEDLE ASPIRATION BIOPSIES;  Surgeon: Collene Gobble, MD;  Location: [facility] ENDOSCOPY;  Service: Pulmonary;;   CARDIAC CATHETERIZATION  06/1999   noncritical disease invovling PDA   CARDIAC CATHETERIZATION N/A 09/14/2014   Procedure: Left Heart Cath and Coronary Angiography;  Surgeon: Leonie Man, MD;  Location: [facility] INVASIVE CV LAB CUPID;  Service: Cardiovascular;  Laterality: N/A;   CHOLECYSTECTOMY     ESOPHAGOGASTRODUODENOSCOPY (EGD) WITH PROPOFOL Left 07/19/2017   Procedure: ESOPHAGOGASTRODUODENOSCOPY (EGD) WITH PROPOFOL;  Surgeon: Ronnette Juniper, MD;  Location: [facility] ENDOSCOPY;  Service: Gastroenterology;  Laterality: Left;   FINE NEEDLE ASPIRATION  05/25/2020   Procedure: FINE NEEDLE ASPIRATION (FNA) LINEAR;  Surgeon: Collene Gobble, MD;  Location: [facility] ENDOSCOPY;  Service: Pulmonary;;   FRACTURE SURGERY Left 05/2017   left wrist   HARDWARE REMOVAL Left 11/07/2017   Procedure: LEFT WRIST HARDWARE REMOVAL;  Surgeon: Charlotte Crumb, MD;  Location: [facility];  Service: Orthopedics;  Laterality: Left;   INTRAMEDULLARY (IM) NAIL INTERTROCHANTERIC Left 04/27/2021   Procedure: INTRAMEDULLARY (IM) NAIL INTERTROCHANTRIC;  Surgeon: Shona Needles, MD;  Location: [facility];  Service: Orthopedics;  Laterality: Left;   KNEE SURGERY  Bilateral 2001 & 2007   NECK SURGERY  2012   2012   PARTIAL GASTRECTOMY  2005   subtotal   PERCUTANEOUS CORONARY STENT INTERVENTION (PCI-S)  09/14/2014   Procedure: Percutaneous Coronary Stent Intervention (Pci-S);  Surgeon: Leonie Man, MD;  Location: [facility] INVASIVE CV LAB CUPID;  Service: Cardiovascular;;   SHOULDER SURGERY Right    TRANSTHORACIC ECHOCARDIOGRAM  06/02/2010   EF=>55%, normal LV systolic function; normal RV systolic function; mild mitral annular calcif; trace TR; AV mildly sclerotic   VIDEO BRONCHOSCOPY WITH ENDOBRONCHIAL NAVIGATION N/A 05/25/2020   Procedure: VIDEO BRONCHOSCOPY WITH ENDOBRONCHIAL NAVIGATION;  Surgeon: Collene Gobble, MD;  Location: [facility] ENDOSCOPY;  Service: Pulmonary;  Laterality: N/A;   VIDEO BRONCHOSCOPY WITH ENDOBRONCHIAL ULTRASOUND N/A 05/25/2020   Procedure: VIDEO BRONCHOSCOPY WITH ENDOBRONCHIAL ULTRASOUND;  Surgeon: Collene Gobble, MD;  Location: [facility] ENDOSCOPY;  Service: Pulmonary;  Laterality: N/A;   WRIST OSTEOTOMY Left 11/07/2017   Procedure: LEFT WRIST DISTAL ULNA RESECTION WITH EXTENSOR CARPI ULNARIS STABILIZATION;  Surgeon: Charlotte Crumb, MD;  Location: [facility];  Service: Orthopedics;  Laterality:  Left;     OB History   No obstetric history on file.     Family History  Problem Relation Age of Onset   Heart disease Mother        also HTN   Diabetes Mother    Colon cancer Mother    Heart disease Brother        deceased at 87   Hypertension Brother    Heart attack Brother    Heart disease Brother    Hypertension Brother     Social History   Tobacco Use   Smoking status: Every Day    Packs/day: 0.50    Years: 60.00    Pack years: 30.00    Types: Cigarettes   Smokeless tobacco: Never   Tobacco comments:    currently smokes 1/2 ppd or less  Vaping Use   Vaping Use: Former  Substance Use Topics   Alcohol use: No   Drug use: No    Home Medications Prior to Admission medications   Medication Sig Start Date End Date Taking?  Authorizing Provider  acetaminophen (TYLENOL) 500 MG tablet Take 2 tablets (1,000 mg total) by mouth every 8 (eight) hours as needed for mild pain or moderate pain. 04/29/21   Corinne Ports, PA-C  aspirin EC 81 MG EC tablet Take 1 tablet (81 mg total) by mouth 2 (two) times daily. Swallow whole. 04/29/21 05/29/21  Corinne Ports, PA-C  budesonide-formoterol (SYMBICORT) 160-4.5 MCG/ACT inhaler TAKE 2 PUFFS BY MOUTH TWICE A DAY Patient taking differently: Inhale 2 puffs into the lungs in the morning and at bedtime. 12/16/20   [name], FNP  busPIRone (BUSPAR) 5 MG tablet Take 5 mg by mouth 2 (two) times daily as needed. 04/13/21   [provider]  ciclopirox (PENLAC) 8 % solution Apply 1 application topically at bedtime. Apply over nail and surrounding skin. Apply daily over previous coat. After seven (7) days, file nail and continue cycle. 05/25/20   Collene Gobble, MD  donepezil (ARICEPT) 10 MG tablet Take 1 tablet (10 mg total) by mouth at bedtime. 12/16/20   [name], FNP  ferrous sulfate 325 (65 FE) MG tablet Take 325 mg by mouth daily with breakfast.    [provider]  furosemide (LASIX) 20 MG tablet Take 1 tablet by mouth Mon - Fri daily Patient taking differently: Take 20 mg by mouth See admin instructions. Monday-friday 02/02/21   [name], FNP  gabapentin (NEURONTIN) 100 MG capsule Take 1 capsule (100 mg total) by mouth 2 (two) times daily. 12/16/20   [name], FNP  memantine (NAMENDA) 5 MG tablet Take 1 tablet (5 mg total) by mouth daily. 12/16/20   [name], FNP  methocarbamol (ROBAXIN) 500 MG tablet Take 1 tablet (500 mg total) by mouth every 6 (six) hours as needed for muscle spasms. 04/29/21   Corinne Ports, PA-C  metoprolol tartrate (LOPRESSOR) 25 MG tablet Take 1 tablet (25 mg total) by mouth 2 (two) times daily. 12/17/20   Hilty, Nadean Corwin, MD  nitroGLYCERIN (NITROSTAT) 0.4 MG SL tablet PLACE ONE TABLET UNDER THE TONGUE EVERY 5 MINUTES AS NEEDED  FOR CHEST PAIN. Patient not taking: Reported on 04/26/2021 12/17/20   Pixie Casino, MD  oxybutynin (DITROPAN) 5 MG tablet Take 0.5 tablets (2.5 mg total) by mouth 2 (two) times daily. 11/12/20   [name], FNP  pantoprazole (PROTONIX) 40 MG tablet Take 1 tablet (40 mg total) by mouth 2 (two) times daily. 12/16/20  [name], FNP  polyethylene glycol [facility] / GLYCOLAX) packet Take 17 g by mouth daily as needed for mild constipation.    [provider]  Potassium Chloride ER 20 MEQ TBCR Take 2 tablets by mouth every morning. Patient taking differently: Take 40 mEq by mouth every morning. 12/17/20   Hilty, Nadean Corwin, MD  QUEtiapine (SEROQUEL) 50 MG tablet TAKE 1 TABLET BY MOUTH EVERYDAY AT BEDTIME Patient taking differently: Take 50 mg by mouth at bedtime. 12/16/20   [name], FNP  rosuvastatin (CRESTOR) 40 MG tablet Take 1 tablet (40 mg total) by mouth daily. Patient not taking: Reported on 04/26/2021 12/17/20   Pixie Casino, MD  traMADol (ULTRAM) 50 MG tablet TAKE 1 TABLET 3 TIMES A DAY AND CAN TAKE 1 EXTRA 20 DAYS/MONTH Patient taking differently: Take 50 mg by mouth in the morning, at noon, and at bedtime. 01/31/21   [name], FNP    Allergies    Buprenorphine hcl, Morphine and related, Celebrex [celecoxib], Vioxx [rofecoxib], and Codeine  Review of Systems   Review of Systems  Unable to perform ROS: Dementia   Physical Exam Updated Vital Signs BP 132/67    Pulse 76    Temp 98.5 F (36.9 C) (Oral)    Resp 19    SpO2 100%   Physical Exam Vitals and nursing note reviewed.  Constitutional:      Appearance: She is ill-appearing. She is not diaphoretic.     Comments: Chronically ill-appearing, appears deconditioned, is sleeping, awakens to voice answers questions before falling back asleep.  HENT:     Head: Normocephalic and atraumatic.     Comments: Mucus membranes are more dry than expected.  Patient is however breathing through her mouth primarily. Eyes:      General: No scleral icterus.       Right eye: No discharge.        Left eye: No discharge.     Conjunctiva/sclera: Conjunctivae normal.  Cardiovascular:     Rate and Rhythm: Normal rate and regular rhythm.     Comments: 2+ radial, PT pulses bilaterally.  Pulmonary:     Effort: No respiratory distress.     Breath sounds: No stridor. Examination of the right-lower field reveals rhonchi and rales. Examination of the left-lower field reveals rhonchi and rales. Rhonchi and rales present.     Comments: Increased work of breathing Chest:     Chest wall: No tenderness.  Abdominal:     General: There is no distension.     Palpations: Abdomen is soft.     Tenderness: There is no abdominal tenderness.  Musculoskeletal:        General: No deformity.     Cervical back: Normal range of motion and neck supple.     Right lower leg: No tenderness. No edema.     Left lower leg: No tenderness. Edema present.     Comments: No calf pain bilaterally.   Skin:    General: Skin is warm and dry.  Neurological:     Motor: No abnormal muscle tone.     Comments: Patient is sleeping, wakes up to voice is able to answer questions and then falls back asleep.  She moves all 4 extremities spontaneously.  Psychiatric:        Behavior: Behavior normal.    ED Results / Procedures / Treatments   Labs (all labs ordered are listed, but only abnormal results are displayed) Labs Reviewed  CBC WITH DIFFERENTIAL/PLATELET - Abnormal; Notable  for the following components:      Result Value   RBC 3.06 (*)    Hemoglobin 9.0 (*)    HCT 31.0 (*)    MCV 101.3 (*)    MCHC 29.0 (*)    RDW 17.9 (*)    Platelets 467 (*)    All other components within normal limits  COMPREHENSIVE METABOLIC PANEL - Abnormal; Notable for the following components:   Chloride 112 (*)    CO2 18 (*)    Glucose, Bld 102 (*)    BUN 31 (*)    Creatinine, Ser 1.27 (*)    Calcium 8.7 (*)    Total Protein 6.3 (*)    Albumin 2.6 (*)    Alkaline  Phosphatase 205 (*)    GFR, Estimated 42 (*)    All other components within normal limits  I-STAT VENOUS BLOOD GAS, ED - Abnormal; Notable for the following components:   pCO2, Ven 36.4 (*)    Bicarbonate 17.7 (*)    TCO2 19 (*)    Acid-base deficit 8.0 (*)    HCT 31.0 (*)    Hemoglobin 10.5 (*)    All other components within normal limits  TROPONIN I (HIGH SENSITIVITY) - Abnormal; Notable for the following components:   Troponin I (High Sensitivity) 215 (*)    All other components within normal limits  TROPONIN I (HIGH SENSITIVITY) - Abnormal; Notable for the following components:   Troponin I (High Sensitivity) 228 (*)    All other components within normal limits  RESP PANEL BY RT-PCR (FLU A&B, COVID) ARPGX2  BRAIN NATRIURETIC PEPTIDE  LACTIC ACID, PLASMA  BLOOD GAS, VENOUS  URINALYSIS, ROUTINE W REFLEX MICROSCOPIC  LACTIC ACID, PLASMA    EKG None  Radiology DG Chest Port 1 View  Result Date: 05/08/2021 CLINICAL DATA:  Dyspnea, chest pain, shortness of breath, history CHF, COPD, coronary artery disease post MI, hypertension, smoker EXAM: PORTABLE CHEST 1 VIEW COMPARISON:  Portable exam 2021 hours compared to 04/26/2021 FINDINGS: Upper normal heart size. Mediastinal contours and pulmonary vascularity normal. Persistent RIGHT apical mass, by prior report a biopsy-proven non-small cell lung cancer. Skin folds project over the chest bilaterally. Chronic accentuation of RIGHT upper lobe markings. No acute infiltrate, pleural effusion or pneumothorax. RIGHT shoulder prosthesis and advanced LEFT glenohumeral degenerative changes noted. IMPRESSION: Known RIGHT apex lung cancer. Chronic lung changes without acute abnormalities. Electronically Signed   By: Lavonia Dana M.D.   On: 05/08/2021 20:52    Procedures Procedures   Medications Ordered in ED Medications - No data to display  ED Course  I have reviewed the triage vital signs and the nursing notes.  Pertinent labs & imaging  results that were available during my care of the patient were reviewed by me and considered in my medical decision making (see chart for details).  Clinical Course as of 05/09/21 Danne Baxter May 08, 2021  2155 Hemoglobin(!): 9.0 Not significantly off from baseline.  [EH]    Clinical Course User Index [EH] Ollen Gross   MDM Rules/Calculators/A&P                          Patient is a 80 year old woman who presents today for evaluation of sudden onset chest pain and shortness of breath. She is recently postop.  She is also newly hypoxic.  She is afebrile.  She is placed on nasal cannula oxygen.  He is anemic at baseline, does not  appear acutely significantly changed.  Flu and COVID test is negative, VBG shows bicarb slightly low at 17.7, pH is normal.  CXR shows known lung cancer and chronic lung changes.    Her troponin is elevated x2.  Given the sudden onset of her symptoms, combined with recent postoperative state and slight left leg swelling, in the setting of active lung cancer she has high risk for PE.  CTA PE study is ordered.  I discussed contrast with patient and her son at bedside.    Patient will require admission for hypoxia and elevated troponin.  At the time of shift change she is pending PE study.    At shift change care was transferred to S. Petrucelli PA-C who will follow pending studies, re-evaulate and determine disposition.    Note: Portions of this report may have been transcribed using voice recognition software. Every effort was made to ensure accuracy; however, inadvertent computerized transcription errors may be present    Final Clinical Impression(s) / ED Diagnoses Final diagnoses:  Shortness of breath  Hypoxia  Chest pain, unspecified type  Elevated troponin    Rx / DC Orders ED Discharge Orders     None        Ollen Gross 05/09/21 Arlyn Dunning, MD 05/09/21 [phone]

## 2021-05-08 NOTE — ED Triage Notes (Signed)
Pt bib [facility] from [facility] c/o chest pain and SOB. Pt had left hip surgery 10 days ago, has been at [facility] for 3 days. Nursing home reports pt O2 sats 70s-80s, placed on 4L [place]. O2 98% with EMS non 4L. Lung sounds clear and equal bilaterally per EMS. Pt axo x4.

## 2021-05-09 ENCOUNTER — Emergency Department (HOSPITAL_COMMUNITY): Payer: No Typology Code available for payment source

## 2021-05-09 ENCOUNTER — Encounter (HOSPITAL_COMMUNITY): Payer: Self-pay | Admitting: Internal Medicine

## 2021-05-09 ENCOUNTER — Observation Stay (HOSPITAL_COMMUNITY): Payer: No Typology Code available for payment source

## 2021-05-09 DIAGNOSIS — Z66 Do not resuscitate: Secondary | ICD-10-CM | POA: Diagnosis present

## 2021-05-09 DIAGNOSIS — N1831 Chronic kidney disease, stage 3a: Secondary | ICD-10-CM

## 2021-05-09 DIAGNOSIS — Z9049 Acquired absence of other specified parts of digestive tract: Secondary | ICD-10-CM | POA: Diagnosis not present

## 2021-05-09 DIAGNOSIS — I82453 Acute embolism and thrombosis of peroneal vein, bilateral: Secondary | ICD-10-CM | POA: Diagnosis present

## 2021-05-09 DIAGNOSIS — I2510 Atherosclerotic heart disease of native coronary artery without angina pectoris: Secondary | ICD-10-CM | POA: Diagnosis present

## 2021-05-09 DIAGNOSIS — J439 Emphysema, unspecified: Secondary | ICD-10-CM | POA: Diagnosis present

## 2021-05-09 DIAGNOSIS — E538 Deficiency of other specified B group vitamins: Secondary | ICD-10-CM | POA: Diagnosis present

## 2021-05-09 DIAGNOSIS — E7800 Pure hypercholesterolemia, unspecified: Secondary | ICD-10-CM | POA: Diagnosis present

## 2021-05-09 DIAGNOSIS — D638 Anemia in other chronic diseases classified elsewhere: Secondary | ICD-10-CM | POA: Diagnosis present

## 2021-05-09 DIAGNOSIS — F02818 Dementia in other diseases classified elsewhere, unspecified severity, with other behavioral disturbance: Secondary | ICD-10-CM | POA: Diagnosis present

## 2021-05-09 DIAGNOSIS — I2699 Other pulmonary embolism without acute cor pulmonale: Secondary | ICD-10-CM

## 2021-05-09 DIAGNOSIS — R0602 Shortness of breath: Secondary | ICD-10-CM | POA: Diagnosis present

## 2021-05-09 DIAGNOSIS — R079 Chest pain, unspecified: Secondary | ICD-10-CM

## 2021-05-09 DIAGNOSIS — I5042 Chronic combined systolic (congestive) and diastolic (congestive) heart failure: Secondary | ICD-10-CM | POA: Diagnosis present

## 2021-05-09 DIAGNOSIS — I252 Old myocardial infarction: Secondary | ICD-10-CM | POA: Diagnosis not present

## 2021-05-09 DIAGNOSIS — Z8541 Personal history of malignant neoplasm of cervix uteri: Secondary | ICD-10-CM | POA: Diagnosis not present

## 2021-05-09 DIAGNOSIS — R0902 Hypoxemia: Secondary | ICD-10-CM | POA: Diagnosis present

## 2021-05-09 DIAGNOSIS — Z20822 Contact with and (suspected) exposure to covid-19: Secondary | ICD-10-CM | POA: Diagnosis present

## 2021-05-09 DIAGNOSIS — K219 Gastro-esophageal reflux disease without esophagitis: Secondary | ICD-10-CM | POA: Diagnosis present

## 2021-05-09 DIAGNOSIS — I130 Hypertensive heart and chronic kidney disease with heart failure and stage 1 through stage 4 chronic kidney disease, or unspecified chronic kidney disease: Secondary | ICD-10-CM | POA: Diagnosis present

## 2021-05-09 DIAGNOSIS — Z90710 Acquired absence of both cervix and uterus: Secondary | ICD-10-CM | POA: Diagnosis not present

## 2021-05-09 DIAGNOSIS — F17210 Nicotine dependence, cigarettes, uncomplicated: Secondary | ICD-10-CM | POA: Diagnosis present

## 2021-05-09 DIAGNOSIS — C3491 Malignant neoplasm of unspecified part of right bronchus or lung: Secondary | ICD-10-CM

## 2021-05-09 DIAGNOSIS — G301 Alzheimer's disease with late onset: Secondary | ICD-10-CM | POA: Diagnosis present

## 2021-05-09 DIAGNOSIS — G629 Polyneuropathy, unspecified: Secondary | ICD-10-CM | POA: Diagnosis present

## 2021-05-09 DIAGNOSIS — I10 Essential (primary) hypertension: Secondary | ICD-10-CM | POA: Diagnosis not present

## 2021-05-09 DIAGNOSIS — Z8249 Family history of ischemic heart disease and other diseases of the circulatory system: Secondary | ICD-10-CM | POA: Diagnosis not present

## 2021-05-09 DIAGNOSIS — Z85118 Personal history of other malignant neoplasm of bronchus and lung: Secondary | ICD-10-CM | POA: Diagnosis not present

## 2021-05-09 LAB — CBC
HCT: 33 % — ABNORMAL LOW (ref 36.0–46.0)
Hemoglobin: 9.6 g/dL — ABNORMAL LOW (ref 12.0–15.0)
MCH: 29.2 pg (ref 26.0–34.0)
MCHC: 29.1 g/dL — ABNORMAL LOW (ref 30.0–36.0)
MCV: 100.3 fL — ABNORMAL HIGH (ref 80.0–100.0)
Platelets: 540 10*3/uL — ABNORMAL HIGH (ref 150–400)
RBC: 3.29 MIL/uL — ABNORMAL LOW (ref 3.87–5.11)
RDW: 17.7 % — ABNORMAL HIGH (ref 11.5–15.5)
WBC: 6.1 10*3/uL (ref 4.0–10.5)
nRBC: 0 % (ref 0.0–0.2)

## 2021-05-09 LAB — COMPREHENSIVE METABOLIC PANEL
ALT: 20 U/L (ref 0–44)
AST: 21 U/L (ref 15–41)
Albumin: 2.6 g/dL — ABNORMAL LOW (ref 3.5–5.0)
Alkaline Phosphatase: 190 U/L — ABNORMAL HIGH (ref 38–126)
Anion gap: 6 (ref 5–15)
BUN: 26 mg/dL — ABNORMAL HIGH (ref 8–23)
CO2: 20 mmol/L — ABNORMAL LOW (ref 22–32)
Calcium: 8.8 mg/dL — ABNORMAL LOW (ref 8.9–10.3)
Chloride: 109 mmol/L (ref 98–111)
Creatinine, Ser: 1.06 mg/dL — ABNORMAL HIGH (ref 0.44–1.00)
GFR, Estimated: 53 mL/min — ABNORMAL LOW (ref >60)
Glucose, Bld: 96 mg/dL (ref 70–99)
Potassium: 4.8 mmol/L (ref 3.5–5.1)
Sodium: 135 mmol/L (ref 135–145)
Total Bilirubin: 0.5 mg/dL (ref 0.3–1.2)
Total Protein: 6.1 g/dL — ABNORMAL LOW (ref 6.5–8.1)

## 2021-05-09 LAB — URINALYSIS, ROUTINE W REFLEX MICROSCOPIC
Bilirubin Urine: NEGATIVE
Glucose, UA: NEGATIVE mg/dL
Hgb urine dipstick: NEGATIVE
Ketones, ur: NEGATIVE mg/dL
Leukocytes,Ua: NEGATIVE
Nitrite: NEGATIVE
Protein, ur: NEGATIVE mg/dL
Specific Gravity, Urine: 1.015 (ref 1.005–1.030)
pH: 6.5 (ref 5.0–8.0)

## 2021-05-09 LAB — HEPARIN LEVEL (UNFRACTIONATED)
Heparin Unfractionated: 0.19 IU/mL — ABNORMAL LOW (ref 0.30–0.70)
Heparin Unfractionated: 0.4 IU/mL (ref 0.30–0.70)

## 2021-05-09 LAB — ECHOCARDIOGRAM COMPLETE
AR max vel: 2.44 cm2
AV Area VTI: 2.12 cm2
AV Area mean vel: 2.22 cm2
AV Mean grad: 3 mmHg
AV Peak grad: 6.6 mmHg
Ao pk vel: 1.28 m/s
Area-P 1/2: 2.55 cm2
Height: 64 in
MV VTI: 1.33 cm2
S' Lateral: 2.7 cm
Weight: 2250.46 oz

## 2021-05-09 LAB — TROPONIN I (HIGH SENSITIVITY)
Troponin I (High Sensitivity): 180 ng/L (ref <18)
Troponin I (High Sensitivity): 156 ng/L (ref <18)

## 2021-05-09 LAB — LACTIC ACID, PLASMA: Lactic Acid, Venous: 1.3 mmol/L (ref 0.5–1.9)

## 2021-05-09 LAB — MRSA NEXT GEN BY PCR, NASAL: MRSA by PCR Next Gen: NOT DETECTED

## 2021-05-09 MED ORDER — FERROUS SULFATE 325 (65 FE) MG PO TABS
325.0000 mg | ORAL_TABLET | Freq: Every day | ORAL | Status: DC
  Administered 2021-05-09 – 2021-05-11 (×3): 325 mg via ORAL
  Filled 2021-05-09 (×3): qty 1

## 2021-05-09 MED ORDER — HEPARIN BOLUS VIA INFUSION
1500.0000 [IU] | Freq: Once | INTRAVENOUS | Status: AC
  Administered 2021-05-09: 13:00:00 1500 [IU] via INTRAVENOUS
  Filled 2021-05-09: qty 1500

## 2021-05-09 MED ORDER — POTASSIUM CHLORIDE CRYS ER 20 MEQ PO TBCR
40.0000 meq | EXTENDED_RELEASE_TABLET | Freq: Every morning | ORAL | Status: DC
  Administered 2021-05-09 – 2021-05-10 (×2): 40 meq via ORAL
  Filled 2021-05-09 (×2): qty 2

## 2021-05-09 MED ORDER — MEMANTINE HCL 10 MG PO TABS
5.0000 mg | ORAL_TABLET | Freq: Every day | ORAL | Status: DC
  Administered 2021-05-09 – 2021-05-11 (×3): 5 mg via ORAL
  Filled 2021-05-09 (×3): qty 1

## 2021-05-09 MED ORDER — ACETAMINOPHEN 650 MG RE SUPP: 650.0000 mg | Freq: Four times a day (QID) | RECTAL | Status: DC | PRN

## 2021-05-09 MED ORDER — METOPROLOL TARTRATE 25 MG PO TABS
25.0000 mg | ORAL_TABLET | Freq: Two times a day (BID) | ORAL | Status: DC
  Administered 2021-05-09 – 2021-05-11 (×5): 25 mg via ORAL
  Filled 2021-05-09 (×5): qty 1

## 2021-05-09 MED ORDER — SENNOSIDES-DOCUSATE SODIUM 8.6-50 MG PO TABS: 1.0000 | ORAL_TABLET | Freq: Every evening | ORAL | Status: DC | PRN

## 2021-05-09 MED ORDER — ACETAMINOPHEN 325 MG PO TABS: 650.0000 mg | ORAL_TABLET | Freq: Four times a day (QID) | ORAL | Status: DC | PRN

## 2021-05-09 MED ORDER — MOMETASONE FURO-FORMOTEROL FUM 200-5 MCG/ACT IN AERO
2.0000 | INHALATION_SPRAY | Freq: Two times a day (BID) | RESPIRATORY_TRACT | Status: DC
  Administered 2021-05-09: 11:00:00 2 via RESPIRATORY_TRACT
  Filled 2021-05-09 (×2): qty 8.8

## 2021-05-09 MED ORDER — HEPARIN (PORCINE) 25000 UT/250ML-% IV SOLN
1200.0000 [IU]/h | INTRAVENOUS | Status: AC
  Administered 2021-05-09: 03:00:00 1000 [IU]/h via INTRAVENOUS
  Administered 2021-05-09: 17:00:00 1200 [IU]/h via INTRAVENOUS
  Filled 2021-05-09 (×2): qty 250

## 2021-05-09 MED ORDER — HYDRALAZINE HCL 20 MG/ML IJ SOLN: 10.0000 mg | INTRAMUSCULAR | Status: DC | PRN

## 2021-05-09 MED ORDER — METOPROLOL TARTRATE 5 MG/5ML IV SOLN: 5.0000 mg | INTRAVENOUS | Status: DC | PRN

## 2021-05-09 MED ORDER — ASPIRIN 81 MG PO CHEW
81.0000 mg | CHEWABLE_TABLET | Freq: Two times a day (BID) | ORAL | Status: DC
  Administered 2021-05-09 – 2021-05-11 (×5): 81 mg via ORAL
  Filled 2021-05-09 (×5): qty 1

## 2021-05-09 MED ORDER — METHOCARBAMOL 500 MG PO TABS: 500.0000 mg | ORAL_TABLET | Freq: Four times a day (QID) | ORAL | Status: DC | PRN

## 2021-05-09 MED ORDER — OXYBUTYNIN CHLORIDE 5 MG PO TABS
2.5000 mg | ORAL_TABLET | Freq: Two times a day (BID) | ORAL | Status: DC
  Administered 2021-05-09 – 2021-05-11 (×5): 2.5 mg via ORAL
  Filled 2021-05-09 (×5): qty 1

## 2021-05-09 MED ORDER — ROSUVASTATIN CALCIUM 20 MG PO TABS
40.0000 mg | ORAL_TABLET | Freq: Every day | ORAL | Status: DC
  Administered 2021-05-09 – 2021-05-11 (×3): 40 mg via ORAL
  Filled 2021-05-09 (×3): qty 2

## 2021-05-09 MED ORDER — BUSPIRONE HCL 5 MG PO TABS: 5.0000 mg | ORAL_TABLET | Freq: Two times a day (BID) | ORAL | Status: DC | PRN

## 2021-05-09 MED ORDER — TRAZODONE HCL 50 MG PO TABS: 50.0000 mg | ORAL_TABLET | Freq: Every evening | ORAL | Status: DC | PRN

## 2021-05-09 MED ORDER — IOPAMIDOL (ISOVUE-370) INJECTION 76%
50.0000 mL | Freq: Once | INTRAVENOUS | Status: AC | PRN
  Administered 2021-05-09: 01:00:00 50 mL via INTRAVENOUS

## 2021-05-09 MED ORDER — GABAPENTIN 100 MG PO CAPS
100.0000 mg | ORAL_CAPSULE | Freq: Two times a day (BID) | ORAL | Status: DC
  Administered 2021-05-09 – 2021-05-11 (×5): 100 mg via ORAL
  Filled 2021-05-09 (×5): qty 1

## 2021-05-09 MED ORDER — QUETIAPINE FUMARATE 50 MG PO TABS
50.0000 mg | ORAL_TABLET | Freq: Every day | ORAL | Status: DC
  Administered 2021-05-09 – 2021-05-10 (×2): 50 mg via ORAL
  Filled 2021-05-09 (×3): qty 1

## 2021-05-09 MED ORDER — IPRATROPIUM-ALBUTEROL 0.5-2.5 (3) MG/3ML IN SOLN
3.0000 mL | RESPIRATORY_TRACT | Status: DC | PRN
  Filled 2021-05-09: qty 3

## 2021-05-09 MED ORDER — OXYCODONE HCL 5 MG PO TABS: 5.0000 mg | ORAL_TABLET | ORAL | Status: DC | PRN

## 2021-05-09 MED ORDER — PANTOPRAZOLE SODIUM 40 MG PO TBEC
40.0000 mg | DELAYED_RELEASE_TABLET | Freq: Every day | ORAL | Status: DC
  Administered 2021-05-09 – 2021-05-11 (×3): 40 mg via ORAL
  Filled 2021-05-09 (×3): qty 1

## 2021-05-09 MED ORDER — DONEPEZIL HCL 10 MG PO TABS
10.0000 mg | ORAL_TABLET | Freq: Every day | ORAL | Status: DC
  Administered 2021-05-09 – 2021-05-10 (×2): 10 mg via ORAL
  Filled 2021-05-09 (×3): qty 1

## 2021-05-09 MED ORDER — TRAMADOL HCL 50 MG PO TABS: 50.0000 mg | ORAL_TABLET | Freq: Three times a day (TID) | ORAL | Status: DC | PRN

## 2021-05-09 MED ORDER — HEPARIN BOLUS VIA INFUSION
4000.0000 [IU] | Freq: Once | INTRAVENOUS | Status: AC
  Administered 2021-05-09: 03:00:00 4000 [IU] via INTRAVENOUS
  Filled 2021-05-09: qty 4000

## 2021-05-09 MED ORDER — FUROSEMIDE 20 MG PO TABS
20.0000 mg | ORAL_TABLET | ORAL | Status: DC
  Administered 2021-05-09 – 2021-05-11 (×3): 20 mg via ORAL
  Filled 2021-05-09 (×3): qty 1

## 2021-05-09 NOTE — Progress Notes (Signed)
PROGRESS NOTE    Sara Fox  ZOX:[id number] DOB: Aug 06, 1940 DOA: 05/08/2021 PCP: [facility]   Brief Narrative:  80 year old with history of dementia, CAD status post PCI, COPD, CKD stage IIIa, combined CHF, non-small cell lung cancer status postradiation discharge 10 days ago after left hip fracture requiring surgery and pubic rami fractures, left thoracic spine fracture.  Admitted for shortness of breath found to have bilateral pulmonary embolism without strain pattern.   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 - Anticoagulation-Heparin drip - Echocardiogram-  - Lower extremity Dopplers- prelim +  Recent left hip surgery, pubic rami fracture, thoracic spine fracture - Status post ORIF.  At that time patient was discharged on aspirin 81 mg twice daily for DVT prophylaxis.  Elevated troponin - Downtrending.  Suspect from demand ischemia.  Echocardiogram today.  Already on heparin drip for PE.  History of CAD status post PCI - On aspirin, statin, metoprolol  History of CHF, combined systolic and diastolic - Repeat echocardiogram ordered - Lasix 20 mg, metoprolol 25 mg twice daily  Peripheral neuropathy - On gabapentin 100 mg twice daily  CKD stage IIIa -Cr 1.2  Anemia of chronic disease - On iron supplements.  Bowel regimen.  Non-small cell lung cancer status post SBRT - Follow-up outpatient oncology.  Has an apical mass which is stable.  History of COPD - As needed bronchodilators   DVT prophylaxis: Heparin Drip Code Status: DNR Family  Communication:  Called Son.   On going work up for DVT  Nutritional status           Body mass index is 24.14 kg/m.  Pressure Injury 07/18/17 Stage II -  Partial thickness loss of dermis presenting as a shallow open ulcer with a red, pink wound bed without slough. (Active)  07/18/17 0730  Location: Sacrum  Location Orientation:   Staging: Stage II -  Partial thickness loss of dermis presenting as a shallow open ulcer with a red, pink wound bed without slough.  Wound Description (Comments):   Present on Admission: Yes      Subjective: Feeling ok, no sob at rest.   Examination:  Constitutional: Not in acute distress Respiratory: Clear to auscultation bilaterally Cardiovascular: Normal sinus rhythm, no rubs Abdomen: Nontender nondistended good bowel sounds Musculoskeletal: No edema noted Skin: No rashes seen Neurologic: CN 2-12 grossly intact.  And nonfocal Psychiatric: AAOx 2, baseline per family.     Objective: Vitals:   05/09/21 0445 05/09/21 0515 05/09/21 0700 05/09/21 0801  BP: 128/74 126/68 102/72   Pulse: 74 73 74   Resp: (!) 21 16 19    Temp:    98.2 F (36.8 C)  TempSrc:    Oral  SpO2: 100% 100% 100%   Weight:      Height:       No intake or output data in the 24 hours ending 05/09/21 0803 Filed Weights   05/09/21 0100  Weight: 63.8 kg     Data Reviewed:   CBC: Recent Labs  Lab 05/08/21 2004 05/08/21 2012  WBC 5.2  --   NEUTROABS 3.7  --  HGB 9.0* 10.5*  HCT 31.0* 31.0*  MCV 101.3*  --   PLT 467*  --    Basic Metabolic Panel: Recent Labs  Lab 05/08/21 2004 05/08/21 2012  NA 136 140  K 4.2 4.2  CL 112*  --   CO2 18*  --   GLUCOSE 102*  --   BUN 31*  --   CREATININE 1.27*  --   CALCIUM 8.7*  --    GFR: Estimated Creatinine Clearance: 30 mL/min (A) (by C-G formula based on SCr of 1.27 mg/dL (H)). Liver Function Tests: Recent Labs  Lab 05/08/21 2004  AST 22  ALT 18  ALKPHOS 205*  BILITOT 0.5  PROT 6.3*  ALBUMIN  2.6*   No results for input(s): LIPASE, AMYLASE in the last 168 hours. No results for input(s): AMMONIA in the last 168 hours. Coagulation Profile: No results for input(s): INR, PROTIME in the last 168 hours. Cardiac Enzymes: No results for input(s): CKTOTAL, CKMB, CKMBINDEX, TROPONINI in the last 168 hours. BNP (last 3 results) No results for input(s): PROBNP in the last 8760 hours. HbA1C: No results for input(s): HGBA1C in the last 72 hours. CBG: No results for input(s): GLUCAP in the last 168 hours. Lipid Profile: No results for input(s): CHOL, HDL, LDLCALC, TRIG, CHOLHDL, LDLDIRECT in the last 72 hours. Thyroid Function Tests: No results for input(s): TSH, T4TOTAL, FREET4, T3FREE, THYROIDAB in the last 72 hours. Anemia Panel: No results for input(s): VITAMINB12, FOLATE, FERRITIN, TIBC, IRON, RETICCTPCT in the last 72 hours. Sepsis Labs: Recent Labs  Lab 05/08/21 2020  LATICACIDVEN 1.9    Recent Results (from the past 240 hour(s))  Resp Panel by RT-PCR (Flu A&B, Covid) Nasopharyngeal Swab     Status: None   Collection Time: 05/02/21 11:48 AM   Specimen: Nasopharyngeal Swab; Nasopharyngeal(NP) swabs in vial transport medium  Result Value Ref Range Status   SARS Coronavirus 2 by RT PCR NEGATIVE NEGATIVE Final    Comment: (NOTE) SARS-CoV-2 target nucleic acids are NOT DETECTED.  The SARS-CoV-2 RNA is generally detectable in upper respiratory specimens during the acute phase of infection. The lowest concentration of SARS-CoV-2 viral copies this assay can detect is 138 copies/mL. A negative result does not preclude SARS-Cov-2 infection and should not be used as the sole basis for treatment or other patient management decisions. A negative result may occur with  improper specimen collection/handling, submission of specimen other than nasopharyngeal swab, presence of viral mutation(s) within the areas targeted by this assay, and inadequate number of viral copies(<138  copies/mL). A negative result must be combined with clinical observations, patient history, and epidemiological information. The expected result is Negative.  Fact Sheet for Patients:  BloggerCourse.com  Fact Sheet for Healthcare Providers:  SeriousBroker.it  This test is no t yet approved or cleared by the [place] FDA and  has been authorized for detection and/or diagnosis of SARS-CoV-2 by FDA under an Emergency Use Authorization (EUA). This EUA will remain  in effect (meaning this test can be used) for the duration of the COVID-19 declaration under Section 564(b)(1) of the Act, 21 U.S.C.section 360bbb-3(b)(1), unless the authorization is terminated  or revoked sooner.       Influenza A by PCR NEGATIVE NEGATIVE Final   Influenza B by PCR NEGATIVE NEGATIVE Final    Comment: (NOTE) The Xpert Xpress SARS-CoV-2/FLU/RSV plus assay is intended as an aid in the diagnosis of influenza from Nasopharyngeal swab specimens and should not be used as a sole basis for treatment.  Nasal washings and aspirates are unacceptable for Xpert Xpress SARS-CoV-2/FLU/RSV testing.  Fact Sheet for Patients: BloggerCourse.com  Fact Sheet for Healthcare Providers: SeriousBroker.it  This test is not yet approved or cleared by the [place] FDA and has been authorized for detection and/or diagnosis of SARS-CoV-2 by FDA under an Emergency Use Authorization (EUA). This EUA will remain in effect (meaning this test can be used) for the duration of the COVID-19 declaration under Section 564(b)(1) of the Act, 21 U.S.C. section 360bbb-3(b)(1), unless the authorization is terminated or revoked.  Performed at [facility], [address], [place], [place] [address]   Resp Panel by RT-PCR (Flu A&B, Covid) Nasopharyngeal Swab     Status: None   Collection Time: 05/08/21  8:04 PM   Specimen:  Nasopharyngeal Swab; Nasopharyngeal(NP) swabs in vial transport medium  Result Value Ref Range Status   SARS Coronavirus 2 by RT PCR NEGATIVE NEGATIVE Final    Comment: (NOTE) SARS-CoV-2 target nucleic acids are NOT DETECTED.  The SARS-CoV-2 RNA is generally detectable in upper respiratory specimens during the acute phase of infection. The lowest concentration of SARS-CoV-2 viral copies this assay can detect is 138 copies/mL. A negative result does not preclude SARS-Cov-2 infection and should not be used as the sole basis for treatment or other patient management decisions. A negative result may occur with  improper specimen collection/handling, submission of specimen other than nasopharyngeal swab, presence of viral mutation(s) within the areas targeted by this assay, and inadequate number of viral copies(<138 copies/mL). A negative result must be combined with clinical observations, patient history, and epidemiological information. The expected result is Negative.  Fact Sheet for Patients:  BloggerCourse.com  Fact Sheet for Healthcare Providers:  SeriousBroker.it  This test is no t yet approved or cleared by the [place] FDA and  has been authorized for detection and/or diagnosis of SARS-CoV-2 by FDA under an Emergency Use Authorization (EUA). This EUA will remain  in effect (meaning this test can be used) for the duration of the COVID-19 declaration under Section 564(b)(1) of the Act, 21 U.S.C.section 360bbb-3(b)(1), unless the authorization is terminated  or revoked sooner.       Influenza A by PCR NEGATIVE NEGATIVE Final   Influenza B by PCR NEGATIVE NEGATIVE Final    Comment: (NOTE) The Xpert Xpress SARS-CoV-2/FLU/RSV plus assay is intended as an aid in the diagnosis of influenza from Nasopharyngeal swab specimens and should not be used as a sole basis for treatment. Nasal washings and aspirates are unacceptable for  Xpert Xpress SARS-CoV-2/FLU/RSV testing.  Fact Sheet for Patients: BloggerCourse.com  Fact Sheet for Healthcare Providers: SeriousBroker.it  This test is not yet approved or cleared by the [place] FDA and has been authorized for detection and/or diagnosis of SARS-CoV-2 by FDA under an Emergency Use Authorization (EUA). This EUA will remain in effect (meaning this test can be used) for the duration of the COVID-19 declaration under Section 564(b)(1) of the Act, 21 U.S.C. section 360bbb-3(b)(1), unless the authorization is terminated or revoked.  Performed at [facility], [address], [place], [place] [address]          Radiology Studies: DG Pelvis 1-2 Views  Result Date: 05/09/2021 CLINICAL DATA:  History of recent left hip surgery with pelvic pain, initial encounter EXAM: PELVIS - 1 VIEW COMPARISON:  None. FINDINGS: Proximal medullary rod is noted in the left femur with fixation screws traversing the femoral neck. Fracture fragments are in anatomic alignment. Pelvic ring shows  superior and inferior pubic rami fractures on the right with some mild callus formation. No new focal abnormality is seen. Postsurgical changes in the lower lumbar spine are noted. IMPRESSION: Changes consistent with recent operative fixation of left femoral fracture. Healing right pubic rami fractures. Electronically Signed   By: Alcide Clever M.D.   On: 05/09/2021 02:38   CT Angio Chest PE W and/or Wo Contrast  Result Date: 05/09/2021 CLINICAL DATA:  Chest pain and hypoxia, initial encounter EXAM: CT ANGIOGRAPHY CHEST WITH CONTRAST TECHNIQUE: Multidetector CT imaging of the chest was performed using the standard protocol during bolus administration of intravenous contrast. Multiplanar CT image reconstructions and MIPs were obtained to evaluate the vascular anatomy. CONTRAST:  50mL Isovue 370 COMPARISON:  Chest x-ray from earlier in the same  day, 04/26/2021. FINDINGS: Cardiovascular: Thoracic aorta demonstrates atherosclerotic calcifications. No aneurysmal dilatation or dissection is noted. Coronary calcifications are seen. No cardiac enlargement is noted. The pulmonary artery is well visualized within normal branching pattern. Filling defects are noted within the left lower lobe pulmonary arterial branch medially as well as within the right lower lobe pulmonary artery consistent with multifocal pulmonary emboli. No right heart strain is noted. Mediastinum/Nodes: Thoracic inlet is within normal limits. No sizable hilar or mediastinal adenopathy is noted. The esophagus as visualized is within normal limits. Lungs/Pleura: Lungs are well aerated bilaterally. Persistent right apical mass lesion is noted stable from the recent exam. No new focal infiltrate or effusion is seen. Mild emphysematous changes are noted. Upper Abdomen: Postsurgical changes are noted at the GE junction. The remainder of the upper abdomen is within normal limits. Musculoskeletal: Degenerative changes of the thoracic spine are noted. Chronic compression deformities are noted at T3 and T9. Distal sternal fracture is again noted and stable. Old healing rib fractures are noted on the left. No acute rib abnormality is noted. Review of the MIP images confirms the above findings. IMPRESSION: Bilateral pulmonary emboli in the lower lobes without evidence of right heart strain. Stable right apical mass consistent with the known history of lung carcinoma. No acute abnormality is noted. Aortic Atherosclerosis (ICD10-I70.0) and Emphysema (ICD10-J43.9). Critical Value/emergent results were called by telephone at the time of interpretation on 05/09/2021 at 12:57 am to [facility] , who verbally acknowledged these results. Electronically Signed   By: Alcide Clever M.D.   On: 05/09/2021 01:00   DG Chest Port 1 View  Result Date: 05/08/2021 CLINICAL DATA:  Dyspnea, chest pain,  shortness of breath, history CHF, COPD, coronary artery disease post MI, hypertension, smoker EXAM: PORTABLE CHEST 1 VIEW COMPARISON:  Portable exam 2021 hours compared to 04/26/2021 FINDINGS: Upper normal heart size. Mediastinal contours and pulmonary vascularity normal. Persistent RIGHT apical mass, by prior report a biopsy-proven non-small cell lung cancer. Skin folds project over the chest bilaterally. Chronic accentuation of RIGHT upper lobe markings. No acute infiltrate, pleural effusion or pneumothorax. RIGHT shoulder prosthesis and advanced LEFT glenohumeral degenerative changes noted. IMPRESSION: Known RIGHT apex lung cancer. Chronic lung changes without acute abnormalities. Electronically Signed   By: Ulyses Southward M.D.   On: 05/08/2021 20:52        Scheduled Meds:  aspirin  81 mg Oral BID   donepezil  10 mg Oral QHS   ferrous sulfate  325 mg Oral Q breakfast   furosemide  20 mg Oral Once per day on Mon Tue Wed Thu Fri   gabapentin  100 mg Oral BID   memantine  5 mg Oral Daily  metoprolol tartrate  25 mg Oral BID   mometasone-formoterol  2 puff Inhalation BID   oxybutynin  2.5 mg Oral BID   pantoprazole  40 mg Oral Daily   potassium chloride SA  40 mEq Oral q morning   QUEtiapine  50 mg Oral QHS   rosuvastatin  40 mg Oral Daily   Continuous Infusions:  heparin 1,000 Units/hr (05/09/21 0756)     LOS: 0 days   Time spent= 35 mins    Marquese Burkland Joline Maxcy, MD [facility]  If 7PM-7AM, please contact night-coverage  05/09/2021, 8:03 AM

## 2021-05-09 NOTE — ED Notes (Signed)
Echo at bedside

## 2021-05-09 NOTE — Progress Notes (Signed)
VASCULAR LAB    Bilateral lower extremity venous duplex has been performed.  See CV proc for preliminary results.  Messaged results to Dr. Reesa Chew via secure chat  Sharion Dove, RVT 05/09/2021, 11:27 AM

## 2021-05-09 NOTE — ED Notes (Signed)
Breakfast orders placed 

## 2021-05-09 NOTE — ED Notes (Signed)
Son [name] [phone] would like an update

## 2021-05-09 NOTE — ED Notes (Signed)
Patient transported to CT 

## 2021-05-09 NOTE — Progress Notes (Signed)
ANTICOAGULATION CONSULT NOTE - Initial Consult  Pharmacy Consult for heparin Indication: pulmonary embolus  Allergies  Allergen Reactions   Buprenorphine Hcl Shortness Of Breath    Throat swelling/trouble breathing and lethargic   Morphine And Related Shortness Of Breath    Throat swelling/trouble breathing and lethargic   Celebrex [Celecoxib] Diarrhea and Swelling    Swelling of legs    Vioxx [Rofecoxib] Palpitations   Codeine Other (See Comments)    Bloated     Patient Measurements: Height: 5\' 4"  (162.6 cm) Weight: 63.8 kg (140 lb 10.5 oz) IBW/kg (Calculated) : 54.7  Vital Signs: Temp: 98.5 F (36.9 C) (12/25 1931) Temp Source: Oral (12/25 1931) BP: 102/81 (12/26 0015) Pulse Rate: 73 (12/26 0015)  Labs: Recent Labs    05/08/21 2004 05/08/21 2012 05/08/21 2220  HGB 9.0* 10.5*  --   HCT 31.0* 31.0*  --   PLT 467*  --   --   CREATININE 1.27*  --   --   TROPONINIHS 215*  --  228*    Estimated Creatinine Clearance: 30 mL/min (A) (by C-G formula based on SCr of 1.27 mg/dL (H)).   Medical History: Past Medical History:  Diagnosis Date   Acute kidney injury ([place])    Back pain    Cervical cancer (HCC)    CHF (congestive heart failure) (HCC)    Closed fracture of left distal radius 05/28/2017   COPD (chronic obstructive pulmonary disease) (HCC)    Coronary artery disease    s/p DES x2 to RCA 2016   GERD (gastroesophageal reflux disease)    History of radiation therapy 07/27/20-08/16/20   Right lung- SBRT- Dr. Gery Pray    Hyperlipidemia    Hypertension    Lower leg edema 12/21/2016   Mild cognitive impairment    Myocardial infarction [facility])    Neck pain    Rhabdomyolysis    Stomach problems     Assessment: 80yo female c/o CP and SOB, of note pt had hip surgery 10d ago, CT reveals bilateral PE, to begin heparin.  Goal of Therapy:  Heparin level 0.3-0.7 units/ml Monitor platelets by anticoagulation protocol: Yes   Plan:  Heparin 4000 units IV bolus x1  followed by infusion at 1000 units/hr and monitor heparin levels and CBC.  Wynona Neat, PharmD, BCPS  05/09/2021,1:19 AM

## 2021-05-09 NOTE — Progress Notes (Signed)
PT Cancellation Note  Patient Details Name: Sara Fox MRN: [id number] DOB: 07-30-1940   Cancelled Treatment:    Reason Eval/Treat Not Completed: Medical issues which prohibited therapy Pt with new PE and per protocol, must be on heparin 24 hours prior to initiation of therapy. Will follow up as schedule allows.   Lou Miner, DPT  Acute Rehabilitation Services  Pager: [phone] Office: [phone]    Rudean Hitt 05/09/2021, 8:44 AM

## 2021-05-09 NOTE — Progress Notes (Signed)
New pt admission from ED. Vitals taken. Initial Assessment done. All immediate pertinent needs to patient addressed. Pt is pleasantly confused and oriented to self, CHG bath given, MrSA swab sent to the lab, diet order placed, IV heparin continue @12 , will continue to monitor the patient  Vedder Brittian,rn

## 2021-05-09 NOTE — Progress Notes (Signed)
ANTICOAGULATION CONSULT NOTE  Pharmacy Consult for heparin Indication: pulmonary embolus  Allergies  Allergen Reactions   Buprenorphine Hcl Shortness Of Breath    Throat swelling/trouble breathing and lethargic   Morphine And Related Shortness Of Breath    Throat swelling/trouble breathing and lethargic   Celebrex [Celecoxib] Diarrhea and Swelling    Swelling of legs    Vioxx [Rofecoxib] Palpitations   Codeine Other (See Comments)    Bloated     Patient Measurements: Height: 5\' 4"  (162.6 cm) Weight: 63.8 kg (140 lb 10.5 oz) IBW/kg (Calculated) : 54.7  Vital Signs: Temp: 97.8 F (36.6 C) (12/26 1918) Temp Source: Oral (12/26 1918) BP: 111/65 (12/26 2128) Pulse Rate: 73 (12/26 2128)  Labs: Recent Labs    05/08/21 2004 05/08/21 2012 05/08/21 2220 05/09/21 0443 05/09/21 0731 05/09/21 1040 05/09/21 2148  HGB 9.0* 10.5*  --   --  9.6*  --   --   HCT 31.0* 31.0*  --   --  33.0*  --   --   PLT 467*  --   --   --  540*  --   --   HEPARINUNFRC  --   --   --   --   --  0.19* 0.40  CREATININE 1.27*  --   --   --  1.06*  --   --   TROPONINIHS 215*  --  228* 180* 156*  --   --     Estimated Creatinine Clearance: 35.9 mL/min (A) (by C-G formula based on SCr of 1.06 mg/dL (H)).   Medical History: Past Medical History:  Diagnosis Date   Acute kidney injury ([place])    Back pain    Cervical cancer (HCC)    CHF (congestive heart failure) (HCC)    Closed fracture of left distal radius 05/28/2017   COPD (chronic obstructive pulmonary disease) (HCC)    Coronary artery disease    s/p DES x2 to RCA 2016   GERD (gastroesophageal reflux disease)    History of radiation therapy 07/27/20-08/16/20   Right lung- SBRT- Dr. Gery Pray    Hyperlipidemia    Hypertension    Lower leg edema 12/21/2016   Mild cognitive impairment    Myocardial infarction [facility])    Neck pain    Rhabdomyolysis    Stomach problems     Assessment: 80yo female c/o CP and SOB, of note pt had hip surgery 10d  ago, CT reveals bilateral PE, to begin heparin.  Heparin level therapeutic at 0.40 after increase to 1200 units/hr.  No issues noted.    Goal of Therapy:  Heparin level 0.3-0.7 units/ml Monitor platelets by anticoagulation protocol: Yes   Plan:  Continue heparin infusion at 1200 units/hr  Monitor heparin levels and CBC.  Sloan Leiter, PharmD, BCPS, BCCCP Clinical Pharmacist Please refer to [facility] for [place] numbers 05/09/2021 10:42 PM  Please check AMION for all [place] phone numbers After 10:00 PM, call [place] [phone]

## 2021-05-09 NOTE — H&P (Addendum)
History and Physical    Sara Fox LXB:[id number] DOB: August 22, 1940 DOA: 05/08/2021  PCP: [facility]  Patient coming from: [place].  Chief Complaint: Chest pain and shortness of breath.  HPI: Sara Fox is a 80 y.o. female with history of dementia CAD status post stenting, COPD, chronic kidney disease stage III chronic combined systolic heart failure non-small cell lung cancer status post SBRT who was discharged 10 days ago after being admitted for left hip fracture status post ORIF also at that time had pubic rami fractures on the left side thoracic spine fractures was found to be increasingly short of breath and left-sided chest pain was brought to the ER.  ED Course: In the ER patient had CT angiogram of the chest which shows bilateral pulm embolism with no strain pattern.  However patient's troponins came elevated at 215 and 228.  Rest of the labs at baseline.  BNP was normal.  EKG shows normal sinus rhythm.  COVID test was negative.  Patient was started on heparin infusion admitted for further management.  Review of Systems: As per HPI, rest all negative.   Past Medical History:  Diagnosis Date   Acute kidney injury ([place])    Back pain    Cervical cancer (HCC)    CHF (congestive heart failure) (HCC)    Closed fracture of left distal radius 05/28/2017   COPD (chronic obstructive pulmonary disease) (HCC)    Coronary artery disease    s/p DES x2 to RCA 2016   GERD (gastroesophageal reflux disease)    History of radiation therapy 07/27/20-08/16/20   Right lung- SBRT- Dr. Gery Pray    Hyperlipidemia    Hypertension    Lower leg edema 12/21/2016   Mild cognitive impairment    Myocardial infarction [facility])    Neck pain    Rhabdomyolysis    Stomach problems     Past Surgical History:  Procedure Laterality Date   ABDOMINAL HYSTERECTOMY     BACK SURGERY  2012   lower back   BRONCHIAL BIOPSY  05/25/2020   Procedure: BRONCHIAL BIOPSIES;   Surgeon: Collene Gobble, MD;  Location: [facility] ENDOSCOPY;  Service: Pulmonary;;   BRONCHIAL BRUSHINGS  05/25/2020   Procedure: BRONCHIAL BRUSHINGS;  Surgeon: Collene Gobble, MD;  Location: [facility] ENDOSCOPY;  Service: Pulmonary;;   BRONCHIAL NEEDLE ASPIRATION BIOPSY  05/25/2020   Procedure: BRONCHIAL NEEDLE ASPIRATION BIOPSIES;  Surgeon: Collene Gobble, MD;  Location: [place] ENDOSCOPY;  Service: Pulmonary;;   CARDIAC CATHETERIZATION  06/1999   noncritical disease invovling PDA   CARDIAC CATHETERIZATION N/A 09/14/2014   Procedure: Left Heart Cath and Coronary Angiography;  Surgeon: Leonie Man, MD;  Location: [place] INVASIVE CV LAB CUPID;  Service: Cardiovascular;  Laterality: N/A;   CHOLECYSTECTOMY     ESOPHAGOGASTRODUODENOSCOPY (EGD) WITH PROPOFOL Left 07/19/2017   Procedure: ESOPHAGOGASTRODUODENOSCOPY (EGD) WITH PROPOFOL;  Surgeon: Ronnette Juniper, MD;  Location: [facility] ENDOSCOPY;  Service: Gastroenterology;  Laterality: Left;   FINE NEEDLE ASPIRATION  05/25/2020   Procedure: FINE NEEDLE ASPIRATION (FNA) LINEAR;  Surgeon: Collene Gobble, MD;  Location: [facility] ENDOSCOPY;  Service: Pulmonary;;   FRACTURE SURGERY Left 05/2017   left wrist   HARDWARE REMOVAL Left 11/07/2017   Procedure: LEFT WRIST HARDWARE REMOVAL;  Surgeon: Charlotte Crumb, MD;  Location: [place];  Service: Orthopedics;  Laterality: Left;   INTRAMEDULLARY (IM) NAIL INTERTROCHANTERIC Left 04/27/2021   Procedure: INTRAMEDULLARY (IM) NAIL INTERTROCHANTRIC;  Surgeon: Shona Needles, MD;  Location: [place];  Service: Orthopedics;  Laterality: Left;   KNEE SURGERY Bilateral 2001 & 2007   NECK SURGERY  2012   2012   PARTIAL GASTRECTOMY  2005   subtotal   PERCUTANEOUS CORONARY STENT INTERVENTION (PCI-S)  09/14/2014   Procedure: Percutaneous Coronary Stent Intervention (Pci-S);  Surgeon: Leonie Man, MD;  Location: [facility] INVASIVE CV LAB CUPID;  Service: Cardiovascular;;   SHOULDER SURGERY Right    TRANSTHORACIC ECHOCARDIOGRAM  06/02/2010   EF=>55%, normal LV  systolic function; normal RV systolic function; mild mitral annular calcif; trace TR; AV mildly sclerotic   VIDEO BRONCHOSCOPY WITH ENDOBRONCHIAL NAVIGATION N/A 05/25/2020   Procedure: VIDEO BRONCHOSCOPY WITH ENDOBRONCHIAL NAVIGATION;  Surgeon: Collene Gobble, MD;  Location: [place] ENDOSCOPY;  Service: Pulmonary;  Laterality: N/A;   VIDEO BRONCHOSCOPY WITH ENDOBRONCHIAL ULTRASOUND N/A 05/25/2020   Procedure: VIDEO BRONCHOSCOPY WITH ENDOBRONCHIAL ULTRASOUND;  Surgeon: Collene Gobble, MD;  Location: [place] ENDOSCOPY;  Service: Pulmonary;  Laterality: N/A;   WRIST OSTEOTOMY Left 11/07/2017   Procedure: LEFT WRIST DISTAL ULNA RESECTION WITH EXTENSOR CARPI ULNARIS STABILIZATION;  Surgeon: Charlotte Crumb, MD;  Location: [place];  Service: Orthopedics;  Laterality: Left;     reports that she has been smoking cigarettes. She has a 30.00 pack-year smoking history. She has never used smokeless tobacco. She reports that she does not drink alcohol and does not use drugs.  Allergies  Allergen Reactions   Buprenorphine Hcl Shortness Of Breath    Throat swelling/trouble breathing and lethargic   Morphine And Related Shortness Of Breath    Throat swelling/trouble breathing and lethargic   Celebrex [Celecoxib] Diarrhea and Swelling    Swelling of legs    Vioxx [Rofecoxib] Palpitations   Codeine Other (See Comments)    Bloated     Family History  Problem Relation Age of Onset   Heart disease Mother        also HTN   Diabetes Mother    Colon cancer Mother    Heart disease Brother        deceased at 107   Hypertension Brother    Heart attack Brother    Heart disease Brother    Hypertension Brother     Prior to Admission medications   Medication Sig Start Date End Date Taking? Authorizing Provider  acetaminophen (TYLENOL) 500 MG tablet Take 2 tablets (1,000 mg total) by mouth every 8 (eight) hours as needed for mild pain or moderate pain. 04/29/21  Yes Corinne Ports, PA-C  aspirin EC 81 MG EC tablet  Take 1 tablet (81 mg total) by mouth 2 (two) times daily. Swallow whole. 04/29/21 05/29/21 Yes McClung, Sarah A, PA-C  budesonide-formoterol (SYMBICORT) 160-4.5 MCG/ACT inhaler TAKE 2 PUFFS BY MOUTH TWICE A DAY Patient taking differently: Inhale 2 puffs into the lungs in the morning and at bedtime. 12/16/20  Yes [name], FNP  busPIRone (BUSPAR) 5 MG tablet Take 5 mg by mouth 2 (two) times daily as needed (anxiety). 04/13/21  Yes [provider]  ciclopirox (PENLAC) 8 % solution Apply 1 application topically at bedtime. Apply over nail and surrounding skin. Apply daily over previous coat. After seven (7) days, file nail and continue cycle. 05/25/20  Yes Collene Gobble, MD  donepezil (ARICEPT) 10 MG tablet Take 1 tablet (10 mg total) by mouth at bedtime. 12/16/20  Yes [name], FNP  ferrous sulfate 325 (65 FE) MG tablet Take 325 mg by mouth daily with breakfast.   Yes [provider]  furosemide (LASIX) 20 MG  tablet Take 1 tablet by mouth Mon - Fri daily Patient taking differently: Take 20 mg by mouth See admin instructions. Monday-friday 02/02/21  Yes [name], FNP  gabapentin (NEURONTIN) 100 MG capsule Take 1 capsule (100 mg total) by mouth 2 (two) times daily. 12/16/20  Yes [name], FNP  memantine (NAMENDA) 5 MG tablet Take 1 tablet (5 mg total) by mouth daily. 12/16/20  Yes [name], FNP  methocarbamol (ROBAXIN) 500 MG tablet Take 1 tablet (500 mg total) by mouth every 6 (six) hours as needed for muscle spasms. 04/29/21  Yes McClung, Sarah A, PA-C  metoprolol tartrate (LOPRESSOR) 25 MG tablet Take 1 tablet (25 mg total) by mouth 2 (two) times daily. 12/17/20  Yes Hilty, Nadean Corwin, MD  nitroGLYCERIN (NITROSTAT) 0.4 MG SL tablet PLACE ONE TABLET UNDER THE TONGUE EVERY 5 MINUTES AS NEEDED FOR CHEST PAIN. Patient taking differently: Place 0.4 mg under the tongue every 5 (five) minutes as needed for chest pain. 12/17/20  Yes Hilty, Nadean Corwin, MD  omeprazole (PRILOSEC) 20 MG  capsule Take 20 mg by mouth in the morning and at bedtime.   Yes [provider]  oxybutynin (DITROPAN) 5 MG tablet Take 0.5 tablets (2.5 mg total) by mouth 2 (two) times daily. 11/12/20  Yes [name], FNP  polyethylene glycol (MIRALAX / GLYCOLAX) packet Take 17 g by mouth daily as needed for mild constipation.   Yes [provider]  Potassium Chloride ER 20 MEQ TBCR Take 2 tablets by mouth every morning. Patient taking differently: Take 40 mEq by mouth every morning. 12/17/20  Yes Hilty, Nadean Corwin, MD  QUEtiapine (SEROQUEL) 50 MG tablet TAKE 1 TABLET BY MOUTH EVERYDAY AT BEDTIME Patient taking differently: Take 50 mg by mouth at bedtime. 12/16/20  Yes [name], FNP  rosuvastatin (CRESTOR) 40 MG tablet Take 1 tablet (40 mg total) by mouth daily. 12/17/20  Yes Hilty, Nadean Corwin, MD  traMADol (ULTRAM) 50 MG tablet TAKE 1 TABLET 3 TIMES A DAY AND CAN TAKE 1 EXTRA 20 DAYS/MONTH Patient taking differently: Take 50 mg by mouth in the morning, at noon, and at bedtime. 01/31/21  Yes [name], FNP  pantoprazole (PROTONIX) 40 MG tablet Take 1 tablet (40 mg total) by mouth 2 (two) times daily. Patient not taking: Reported on 05/09/2021 12/16/20   [name], FNP    Physical Exam: Constitutional: Moderately built and nourished. Vitals:   05/08/21 2200 05/08/21 2330 05/09/21 0015 05/09/21 0100  BP: 109/65 132/67 102/81   Pulse: 74 76 73   Resp: 18 19 19    Temp:      TempSrc:      SpO2: 100% 100% 100%   Weight:    63.8 kg  Height:    5\' 4"  (1.626 m)   Eyes: Anicteric no pallor. ENMT: No discharge from the ears eyes nose and mouth. Neck: No mass felt.  No neck rigidity. Respiratory: No rhonchi or crepitations. Cardiovascular: S1-S2 heard. Abdomen: Soft nontender bowel sound present. Musculoskeletal: No edema.  Pain on moving left hip. Skin: No rash. Neurologic: Alert awake oriented to name and place moving all extremities. Psychiatric: Oriented to name and  place.   Labs on Admission: I have personally reviewed following labs and imaging studies  CBC: Recent Labs  Lab 05/08/21 2004 05/08/21 2012  WBC 5.2  --   NEUTROABS 3.7  --   HGB 9.0* 10.5*  HCT 31.0* 31.0*  MCV 101.3*  --   PLT 467*  --  Basic Metabolic Panel: Recent Labs  Lab 05/08/21 2004 05/08/21 2012  NA 136 140  K 4.2 4.2  CL 112*  --   CO2 18*  --   GLUCOSE 102*  --   BUN 31*  --   CREATININE 1.27*  --   CALCIUM 8.7*  --    GFR: Estimated Creatinine Clearance: 30 mL/min (A) (by C-G formula based on SCr of 1.27 mg/dL (H)). Liver Function Tests: Recent Labs  Lab 05/08/21 2004  AST 22  ALT 18  ALKPHOS 205*  BILITOT 0.5  PROT 6.3*  ALBUMIN 2.6*   No results for input(s): LIPASE, AMYLASE in the last 168 hours. No results for input(s): AMMONIA in the last 168 hours. Coagulation Profile: No results for input(s): INR, PROTIME in the last 168 hours. Cardiac Enzymes: No results for input(s): CKTOTAL, CKMB, CKMBINDEX, TROPONINI in the last 168 hours. BNP (last 3 results) No results for input(s): PROBNP in the last 8760 hours. HbA1C: No results for input(s): HGBA1C in the last 72 hours. CBG: No results for input(s): GLUCAP in the last 168 hours. Lipid Profile: No results for input(s): CHOL, HDL, LDLCALC, TRIG, CHOLHDL, LDLDIRECT in the last 72 hours. Thyroid Function Tests: No results for input(s): TSH, T4TOTAL, FREET4, T3FREE, THYROIDAB in the last 72 hours. Anemia Panel: No results for input(s): VITAMINB12, FOLATE, FERRITIN, TIBC, IRON, RETICCTPCT in the last 72 hours. Urine analysis:    Component Value Date/Time   COLORURINE YELLOW 04/26/2021 1300   APPEARANCEUR CLEAR 04/26/2021 1300   LABSPEC 1.010 04/26/2021 1300   PHURINE 7.5 04/26/2021 1300   GLUCOSEU NEGATIVE 04/26/2021 1300   HGBUR NEGATIVE 04/26/2021 1300   BILIRUBINUR NEGATIVE 04/26/2021 1300   BILIRUBINUR Negative 12/15/2020 1551   KETONESUR NEGATIVE 04/26/2021 1300   PROTEINUR  NEGATIVE 04/26/2021 1300   UROBILINOGEN 0.2 12/15/2020 1551   UROBILINOGEN 0.2 09/12/2014 2302   NITRITE NEGATIVE 04/26/2021 1300   LEUKOCYTESUR NEGATIVE 04/26/2021 1300   Sepsis Labs: @LABRCNTIP (procalcitonin:4,lacticidven:4) ) Recent Results (from the past 240 hour(s))  Resp Panel by RT-PCR (Flu A&B, Covid) Nasopharyngeal Swab     Status: None   Collection Time: 05/02/21 11:48 AM   Specimen: Nasopharyngeal Swab; Nasopharyngeal(NP) swabs in vial transport medium  Result Value Ref Range Status   SARS Coronavirus 2 by RT PCR NEGATIVE NEGATIVE Final    Comment: (NOTE) SARS-CoV-2 target nucleic acids are NOT DETECTED.  The SARS-CoV-2 RNA is generally detectable in upper respiratory specimens during the acute phase of infection. The lowest concentration of SARS-CoV-2 viral copies this assay can detect is 138 copies/mL. A negative result does not preclude SARS-Cov-2 infection and should not be used as the sole basis for treatment or other patient management decisions. A negative result may occur with  improper specimen collection/handling, submission of specimen other than nasopharyngeal swab, presence of viral mutation(s) within the areas targeted by this assay, and inadequate number of viral copies(<138 copies/mL). A negative result must be combined with clinical observations, patient history, and epidemiological information. The expected result is Negative.  Fact Sheet for Patients:  EntrepreneurPulse.com.au  Fact Sheet for Healthcare Providers:  IncredibleEmployment.be  This test is no t yet approved or cleared by the [place] FDA and  has been authorized for detection and/or diagnosis of SARS-CoV-2 by FDA under an Emergency Use Authorization (EUA). This EUA will remain  in effect (meaning this test can be used) for the duration of the COVID-19 declaration under Section 564(b)(1) of the Act, 21 U.S.C.section 360bbb-3(b)(1), unless the  authorization is  terminated  or revoked sooner.       Influenza A by PCR NEGATIVE NEGATIVE Final   Influenza B by PCR NEGATIVE NEGATIVE Final    Comment: (NOTE) The Xpert Xpress SARS-CoV-2/FLU/RSV plus assay is intended as an aid in the diagnosis of influenza from Nasopharyngeal swab specimens and should not be used as a sole basis for treatment. Nasal washings and aspirates are unacceptable for Xpert Xpress SARS-CoV-2/FLU/RSV testing.  Fact Sheet for Patients: EntrepreneurPulse.com.au  Fact Sheet for Healthcare Providers: IncredibleEmployment.be  This test is not yet approved or cleared by the [place] FDA and has been authorized for detection and/or diagnosis of SARS-CoV-2 by FDA under an Emergency Use Authorization (EUA). This EUA will remain in effect (meaning this test can be used) for the duration of the COVID-19 declaration under Section 564(b)(1) of the Act, 21 U.S.C. section 360bbb-3(b)(1), unless the authorization is terminated or revoked.  Performed at [facility], [place] [address] [place], [place] [address]   Resp Panel by RT-PCR (Flu A&B, Covid) Nasopharyngeal Swab     Status: None   Collection Time: 05/08/21  8:04 PM   Specimen: Nasopharyngeal Swab; Nasopharyngeal(NP) swabs in vial transport medium  Result Value Ref Range Status   SARS Coronavirus 2 by RT PCR NEGATIVE NEGATIVE Final    Comment: (NOTE) SARS-CoV-2 target nucleic acids are NOT DETECTED.  The SARS-CoV-2 RNA is generally detectable in upper respiratory specimens during the acute phase of infection. The lowest concentration of SARS-CoV-2 viral copies this assay can detect is 138 copies/mL. A negative result does not preclude SARS-Cov-2 infection and should not be used as the sole basis for treatment or other patient management decisions. A negative result may occur with  improper specimen collection/handling, submission of specimen other than  nasopharyngeal swab, presence of viral mutation(s) within the areas targeted by this assay, and inadequate number of viral copies(<138 copies/mL). A negative result must be combined with clinical observations, patient history, and epidemiological information. The expected result is Negative.  Fact Sheet for Patients:  EntrepreneurPulse.com.au  Fact Sheet for Healthcare Providers:  IncredibleEmployment.be  This test is no t yet approved or cleared by the [place] FDA and  has been authorized for detection and/or diagnosis of SARS-CoV-2 by FDA under an Emergency Use Authorization (EUA). This EUA will remain  in effect (meaning this test can be used) for the duration of the COVID-19 declaration under Section 564(b)(1) of the Act, 21 U.S.C.section 360bbb-3(b)(1), unless the authorization is terminated  or revoked sooner.       Influenza A by PCR NEGATIVE NEGATIVE Final   Influenza B by PCR NEGATIVE NEGATIVE Final    Comment: (NOTE) The Xpert Xpress SARS-CoV-2/FLU/RSV plus assay is intended as an aid in the diagnosis of influenza from Nasopharyngeal swab specimens and should not be used as a sole basis for treatment. Nasal washings and aspirates are unacceptable for Xpert Xpress SARS-CoV-2/FLU/RSV testing.  Fact Sheet for Patients: EntrepreneurPulse.com.au  Fact Sheet for Healthcare Providers: IncredibleEmployment.be  This test is not yet approved or cleared by the [place] FDA and has been authorized for detection and/or diagnosis of SARS-CoV-2 by FDA under an Emergency Use Authorization (EUA). This EUA will remain in effect (meaning this test can be used) for the duration of the COVID-19 declaration under Section 564(b)(1) of the Act, 21 U.S.C. section 360bbb-3(b)(1), unless the authorization is terminated or revoked.  Performed at [facility], [place] [address] [place],  [address]  Radiological Exams on Admission: DG Pelvis 1-2 Views  Result Date: 05/09/2021 CLINICAL DATA:  History of recent left hip surgery with pelvic pain, initial encounter EXAM: PELVIS - 1 VIEW COMPARISON:  None. FINDINGS: Proximal medullary rod is noted in the left femur with fixation screws traversing the femoral neck. Fracture fragments are in anatomic alignment. Pelvic ring shows superior and inferior pubic rami fractures on the right with some mild callus formation. No new focal abnormality is seen. Postsurgical changes in the lower lumbar spine are noted. IMPRESSION: Changes consistent with recent operative fixation of left femoral fracture. Healing right pubic rami fractures. Electronically Signed   By: Inez Catalina M.D.   On: 05/09/2021 02:38   CT Angio Chest PE W and/or Wo Contrast  Result Date: 05/09/2021 CLINICAL DATA:  Chest pain and hypoxia, initial encounter EXAM: CT ANGIOGRAPHY CHEST WITH CONTRAST TECHNIQUE: Multidetector CT imaging of the chest was performed using the standard protocol during bolus administration of intravenous contrast. Multiplanar CT image reconstructions and MIPs were obtained to evaluate the vascular anatomy. CONTRAST:  41mL Isovue 370 COMPARISON:  Chest x-ray from earlier in the same day, 04/26/2021. FINDINGS: Cardiovascular: Thoracic aorta demonstrates atherosclerotic calcifications. No aneurysmal dilatation or dissection is noted. Coronary calcifications are seen. No cardiac enlargement is noted. The pulmonary artery is well visualized within normal branching pattern. Filling defects are noted within the left lower lobe pulmonary arterial branch medially as well as within the right lower lobe pulmonary artery consistent with multifocal pulmonary emboli. No right heart strain is noted. Mediastinum/Nodes: Thoracic inlet is within normal limits. No sizable hilar or mediastinal adenopathy is noted. The esophagus as visualized is within normal limits. Lungs/Pleura: Lungs  are well aerated bilaterally. Persistent right apical mass lesion is noted stable from the recent exam. No new focal infiltrate or effusion is seen. Mild emphysematous changes are noted. Upper Abdomen: Postsurgical changes are noted at the GE junction. The remainder of the upper abdomen is within normal limits. Musculoskeletal: Degenerative changes of the thoracic spine are noted. Chronic compression deformities are noted at T3 and T9. Distal sternal fracture is again noted and stable. Old healing rib fractures are noted on the left. No acute rib abnormality is noted. Review of the MIP images confirms the above findings. IMPRESSION: Bilateral pulmonary emboli in the lower lobes without evidence of right heart strain. Stable right apical mass consistent with the known history of lung carcinoma. No acute abnormality is noted. Aortic Atherosclerosis (ICD10-I70.0) and Emphysema (ICD10-J43.9). Critical Value/emergent results were called by telephone at the time of interpretation on 05/09/2021 at 12:57 am to [facility] , who verbally acknowledged these results. Electronically Signed   By: Inez Catalina M.D.   On: 05/09/2021 01:00   DG Chest Port 1 View  Result Date: 05/08/2021 CLINICAL DATA:  Dyspnea, chest pain, shortness of breath, history CHF, COPD, coronary artery disease post MI, hypertension, smoker EXAM: PORTABLE CHEST 1 VIEW COMPARISON:  Portable exam 2021 hours compared to 04/26/2021 FINDINGS: Upper normal heart size. Mediastinal contours and pulmonary vascularity normal. Persistent RIGHT apical mass, by prior report a biopsy-proven non-small cell lung cancer. Skin folds project over the chest bilaterally. Chronic accentuation of RIGHT upper lobe markings. No acute infiltrate, pleural effusion or pneumothorax. RIGHT shoulder prosthesis and advanced LEFT glenohumeral degenerative changes noted. IMPRESSION: Known RIGHT apex lung cancer. Chronic lung changes without acute abnormalities.  Electronically Signed   By: Lavonia Dana M.D.   On: 05/08/2021 20:52    EKG: Independently reviewed.  Normal  sinus rhythm.  Assessment/Plan Principal Problem:   Pulmonary embolism (HCC) Active Problems:   HTN (hypertension)   Stage 3 chronic kidney disease (HCC)   Hypercholesterolemia   Vitamin B12 deficiency   Non-small cell carcinoma of right lung, stage 1 (HCC)   Late onset Alzheimer's dementia with behavioral disturbance ([place])    Acute bilateral pulm embolism likely provoked with recent hip surgery presently hemodynamically stable no strain.  CAT scan however troponins elevated but flat.  We will check 2D echo Dopplers of the lower extremity continue heparin and if continues to remain hemodynamically stable change to oral anticoagulants.  Check Dopplers of the lower extremity. History of CAD status post tenting on aspirin metoprolol and statins.  Troponins are elevated and flat likely from pulmonary embolism.  We will closely monitor.  Check 2D echo. History of chronic combined systolic and diastolic CHF appears compensated on Lasix.  2D echo has been ordered to check for strain pattern. Chronic kidney disease stage III creatinine appears to be at baseline. Anemia -hemoglobin at baseline.  Did receive blood transfusion during last stay.  Follow CBC now that patient is on heparin.  Patient also B12 supplements. Non-small cell lung cancer status post SBRT curative therapy. COPD not actively wheezing. Dementia on Aricept and Namenda. Recent admission for left hip fracture pubic rami fracture T2 and T3 fractures.  Underwent ORIF.  Still has pain on the left hip.  X-rays do not show any acute changes.   DVT prophylaxis: Heparin infusion. Code Status: DNR. Family Communication: We will need to discuss with family. Disposition Plan: Back to facility when stable. Consults called: None. Admission status: Observation.   Rise Patience MD [facility] Pager [phone].  If  7PM-7AM, please contact night-coverage [email or web address] Password [facility]  05/09/2021, 3:19 AM

## 2021-05-09 NOTE — Progress Notes (Signed)
Patient has just arrived from ED. She states that she is missing a "black wallet and clothes". Patient was brought by transport and there were no bags brought with her. Called ED RN [place]. She states she will check the room and call unit back at [phone].

## 2021-05-09 NOTE — Progress Notes (Signed)
[place] for heparin Indication: pulmonary embolus  Allergies  Allergen Reactions   Buprenorphine Hcl Shortness Of Breath    Throat swelling/trouble breathing and lethargic   Morphine And Related Shortness Of Breath    Throat swelling/trouble breathing and lethargic   Celebrex [Celecoxib] Diarrhea and Swelling    Swelling of legs    Vioxx [Rofecoxib] Palpitations   Codeine Other (See Comments)    Bloated     Patient Measurements: Height: 5\' 4"  (162.6 cm) Weight: 63.8 kg (140 lb 10.5 oz) IBW/kg (Calculated) : 54.7  Vital Signs: Temp: 98.2 F (36.8 C) (12/26 0801) Temp Source: Oral (12/26 0801) BP: 106/57 (12/26 1045) Pulse Rate: 65 (12/26 1045)  Labs: Recent Labs    05/08/21 2004 05/08/21 2012 05/08/21 2220 05/09/21 0443 05/09/21 0731 05/09/21 1040  HGB 9.0* 10.5*  --   --  9.6*  --   HCT 31.0* 31.0*  --   --  33.0*  --   PLT 467*  --   --   --  540*  --   HEPARINUNFRC  --   --   --   --   --  0.19*  CREATININE 1.27*  --   --   --  1.06*  --   TROPONINIHS 215*  --  228* 180* 156*  --      Estimated Creatinine Clearance: 35.9 mL/min (A) (by C-G formula based on SCr of 1.06 mg/dL (H)).   Medical History: Past Medical History:  Diagnosis Date   Acute kidney injury ([place])    Back pain    Cervical cancer ([facility])    CHF (congestive heart failure) ([facility])    Closed fracture of left distal radius 05/28/2017   COPD (chronic obstructive pulmonary disease) ([facility])    Coronary artery disease    s/p DES x2 to RCA 2016   GERD (gastroesophageal reflux disease)    History of radiation therapy 07/27/20-08/16/20   Right lung- SBRT- Dr. Gery Pray    Hyperlipidemia    Hypertension    Lower leg edema 12/21/2016   Mild cognitive impairment    Myocardial infarction [facility])    Neck pain    Rhabdomyolysis    Stomach problems     Assessment: 80yo female c/o CP and SOB, of note pt had hip surgery 10d ago, CT reveals bilateral PE, to begin  heparin.  Initial heparin level came back subtherapeutic at 0.19, on 1000 units/hr. Hgb 9.6, plt 540. No s/sx of bleeding or infusion issues.   Goal of Therapy:  Heparin level 0.3-0.7 units/ml Monitor platelets by anticoagulation protocol: Yes   Plan:  Will order heparin 1500 units IV bolus x1  Increase heparin infusion to 1200 units/hr  Order heparin level in 8 hours Monitor heparin levels and CBC.  Antonietta Jewel, PharmD, [place] Clinical Pharmacist  Phone: [phone] 05/09/2021 11:49 AM  Please check AMION for all [place] phone numbers After 10:00 PM, call [place] [phone]

## 2021-05-10 ENCOUNTER — Other Ambulatory Visit (HOSPITAL_COMMUNITY): Payer: Self-pay

## 2021-05-10 DIAGNOSIS — F02818 Dementia in other diseases classified elsewhere, unspecified severity, with other behavioral disturbance: Secondary | ICD-10-CM

## 2021-05-10 DIAGNOSIS — I10 Essential (primary) hypertension: Secondary | ICD-10-CM | POA: Diagnosis not present

## 2021-05-10 DIAGNOSIS — G301 Alzheimer's disease with late onset: Secondary | ICD-10-CM

## 2021-05-10 DIAGNOSIS — I2699 Other pulmonary embolism without acute cor pulmonale: Secondary | ICD-10-CM | POA: Diagnosis not present

## 2021-05-10 DIAGNOSIS — E7800 Pure hypercholesterolemia, unspecified: Secondary | ICD-10-CM

## 2021-05-10 LAB — CBC
HCT: 27.9 % — ABNORMAL LOW (ref 36.0–46.0)
Hemoglobin: 8.4 g/dL — ABNORMAL LOW (ref 12.0–15.0)
MCH: 29.1 pg (ref 26.0–34.0)
MCHC: 30.1 g/dL (ref 30.0–36.0)
MCV: 96.5 fL (ref 80.0–100.0)
Platelets: 535 10*3/uL — ABNORMAL HIGH (ref 150–400)
RBC: 2.89 MIL/uL — ABNORMAL LOW (ref 3.87–5.11)
RDW: 17.2 % — ABNORMAL HIGH (ref 11.5–15.5)
WBC: 5 10*3/uL (ref 4.0–10.5)
nRBC: 0 % (ref 0.0–0.2)

## 2021-05-10 LAB — BASIC METABOLIC PANEL
Anion gap: 8 (ref 5–15)
BUN: 26 mg/dL — ABNORMAL HIGH (ref 8–23)
CO2: 18 mmol/L — ABNORMAL LOW (ref 22–32)
Calcium: 8.9 mg/dL (ref 8.9–10.3)
Chloride: 110 mmol/L (ref 98–111)
Creatinine, Ser: 1.14 mg/dL — ABNORMAL HIGH (ref 0.44–1.00)
GFR, Estimated: 48 mL/min — ABNORMAL LOW (ref >60)
Glucose, Bld: 91 mg/dL (ref 70–99)
Potassium: 4.8 mmol/L (ref 3.5–5.1)
Sodium: 136 mmol/L (ref 135–145)

## 2021-05-10 LAB — RESP PANEL BY RT-PCR (FLU A&B, COVID) ARPGX2
Influenza A by PCR: NEGATIVE
Influenza B by PCR: NEGATIVE
SARS Coronavirus 2 by RT PCR: NEGATIVE

## 2021-05-10 LAB — MAGNESIUM: Magnesium: 2.1 mg/dL (ref 1.7–2.4)

## 2021-05-10 LAB — HEPARIN LEVEL (UNFRACTIONATED): Heparin Unfractionated: 0.31 IU/mL (ref 0.30–0.70)

## 2021-05-10 MED ORDER — APIXABAN 5 MG PO TABS: ORAL_TABLET | ORAL | 0 refills | Status: AC

## 2021-05-10 MED ORDER — APIXABAN 5 MG PO TABS: 5.0000 mg | ORAL_TABLET | Freq: Two times a day (BID) | ORAL | Status: DC

## 2021-05-10 MED ORDER — TRAMADOL HCL 50 MG PO TABS: 50.0000 mg | ORAL_TABLET | Freq: Three times a day (TID) | ORAL | 0 refills | Status: AC

## 2021-05-10 MED ORDER — ASPIRIN 81 MG PO TBEC: 81.0000 mg | DELAYED_RELEASE_TABLET | Freq: Every day | ORAL | 0 refills | Status: AC

## 2021-05-10 MED ORDER — OXYCODONE HCL 5 MG PO TABS: 5.0000 mg | ORAL_TABLET | ORAL | 0 refills | Status: AC | PRN

## 2021-05-10 MED ORDER — APIXABAN 5 MG PO TABS
10.0000 mg | ORAL_TABLET | Freq: Two times a day (BID) | ORAL | Status: DC
  Administered 2021-05-10 – 2021-05-11 (×3): 10 mg via ORAL
  Filled 2021-05-10 (×3): qty 2

## 2021-05-10 NOTE — Progress Notes (Signed)
ANTICOAGULATION CONSULT NOTE - Follow Up Consult  Pharmacy Consult for Heparin > Apixaban Indication: pulmonary embolus and DVT  Allergies  Allergen Reactions   Buprenorphine Hcl Shortness Of Breath    Throat swelling/trouble breathing and lethargic   Morphine And Related Shortness Of Breath    Throat swelling/trouble breathing and lethargic   Celebrex [Celecoxib] Diarrhea and Swelling    Swelling of legs    Vioxx [Rofecoxib] Palpitations   Codeine Other (See Comments)    Bloated     Patient Measurements: Height: 5\' 4"  (162.6 cm) Weight: 60.1 kg (132 lb 7.9 oz) IBW/kg (Calculated) : 54.7 Heparin Dosing Weight: 60 kg  Vital Signs: Temp: 97.9 F (36.6 C) (12/27 0727) Temp Source: Oral (12/27 0727) BP: 123/71 (12/27 0727) Pulse Rate: 71 (12/27 0727)  Labs: Recent Labs    05/08/21 2004 05/08/21 2012 05/08/21 2220 05/09/21 0443 05/09/21 0731 05/09/21 1040 05/09/21 2148 05/10/21 0401  HGB 9.0* 10.5*  --   --  9.6*  --   --  8.4*  HCT 31.0* 31.0*  --   --  33.0*  --   --  27.9*  PLT 467*  --   --   --  540*  --   --  535*  HEPARINUNFRC  --   --   --   --   --  0.19* 0.40 0.31  CREATININE 1.27*  --   --   --  1.06*  --   --  1.14*  TROPONINIHS 215*  --  228* 180* 156*  --   --   --     Estimated Creatinine Clearance: 33.4 mL/min (A) (by C-G formula based on SCr of 1.14 mg/dL (H)).  Assessment: 80yo female c/o CP and SOB, of note pt had hip surgery 10d ago, CTA on 12/26 reveals bilateral PE, IV heparin begun.  Duplex 12/26 showed bilateral age-indeterminate DVT.    Heparin level is low therapeutic (0.31) on 1200 units/hr.   To transition to Apixaban.  80 yrs old and 60 kg, but full-dosing for treatment.  Goal of Therapy:  Heparin level 0.3-0.7 units/ml Appropriate Apixaban regimen for indication Monitor platelets by anticoagulation protocol: Yes   Plan:  Apixaban 10 mg BID for 7 days, then 5 mg BID. Stop IV heparin when giving first Apixaban dose.  Arty Baumgartner, [place] 05/10/2021,8:02 AM

## 2021-05-10 NOTE — Discharge Summary (Addendum)
Physician Discharge Summary  Sara Fox HYQ:[id number] DOB: 12/30/1940 DOA: 05/08/2021  PCP: [facility]  Admit date: 05/08/2021 Discharge date: 05/10/2021  Admitted From: SNF Disposition:  SNF  Recommendations for Outpatient Follow-up:  Follow up with PCP in 1-2 weeks Please obtain BMP/CBC in one week your next doctors visit.  Eliquis BID    Discharge Condition: Stable CODE STATUS: DNR Diet recommendation: Cardiac  Brief/Interim Summary: 80 year old with history of dementia, CAD status post PCI, COPD, CKD stage IIIa, combined CHF, non-small cell lung cancer status postradiation discharge 10 days ago after left hip fracture requiring surgery and pubic rami fractures, left thoracic spine fracture.  Admitted for shortness of breath found to have bilateral pulmonary embolism without strain pattern. Transition Hep drip to Eliquis. LE dopplers is + for DVT b/l, Echo shows ef 60%. PT recommended rehab, therefore transition back.   Sara Fox updated during the hospital stay but no answer on the day of discharge.   Assessment & Plan:   Principal Problem:   Pulmonary embolism (HCC) Active Problems:   HTN (hypertension)   Stage 3 chronic kidney disease (HCC)   Hypercholesterolemia   Vitamin B12 deficiency   Non-small cell carcinoma of right lung, stage 1 (HCC)   Late onset Alzheimer's dementia with behavioral disturbance ([place])     Acute bilateral pulmonary embolism, bilateral lower lobe without cor pulmonale - Suspect provoked by recent surgery. - Anticoagulation-Heparin drip transition to Eliquis PO BID - Echocardiogram- 60% - Lower extremity Dopplers- b/l LE DVT>   Recent left hip surgery, pubic rami fracture, thoracic spine fracture - Status post ORIF.  At that time patient was discharged on aspirin 81 mg twice daily for DVT prophylaxis.   Elevated troponin - Downtrending.  Suspect from demand ischemia.  Echocardiogram done.   History of CAD status post  PCI - On aspirin, statin, metoprolol  History of CHF, combined systolic and diastolic - Echo showed ef 60% - Lasix 20 mg, metoprolol 25 mg twice daily   Peripheral neuropathy - On gabapentin 100 mg twice daily  CKD stage IIIa -Cr 1.2  Anemia of chronic disease - On iron supplements.  Bowel regimen.  Non-small cell lung cancer status post SBRT - Follow-up outpatient oncology.  Has an apical mass which is stable.  History of COPD - As needed bronchodilators    Body mass index is 22.74 kg/m.  Pressure Injury 07/18/17 Stage II -  Partial thickness loss of dermis presenting as a shallow open ulcer with a red, pink wound bed without slough. (Active)  07/18/17 0730  Location: Sacrum  Location Orientation:   Staging: Stage II -  Partial thickness loss of dermis presenting as a shallow open ulcer with a red, pink wound bed without slough.  Wound Description (Comments):   Present on Admission: Yes        Discharge Diagnoses:  Principal Problem:   Pulmonary embolism ([place]) Active Problems:   HTN (hypertension)   Stage 3 chronic kidney disease (HCC)   Hypercholesterolemia   Vitamin B12 deficiency   Non-small cell carcinoma of right lung, stage 1 ([place])   Late onset Alzheimer's dementia with behavioral disturbance ([place])      Consultations: None  Subjective: Feels ok, no complaints. Sitting up in the chair. No SOB.   Discharge Exam: Vitals:   05/10/21 0727 05/10/21 1147  BP: 123/71 (!) 94/57  Pulse: 71 70  Resp: 17 16  Temp: 97.9 F (36.6 C) (!) 97.5 F (36.4 C)  SpO2: 97% 100%  Vitals:   05/09/21 2308 05/10/21 0439 05/10/21 0727 05/10/21 1147  BP: 115/67 101/64 123/71 (!) 94/57  Pulse:   71 70  Resp: 16 18 17 16   Temp: 98 F (36.7 C) (!) 97.4 F (36.3 C) 97.9 F (36.6 C) (!) 97.5 F (36.4 C)  TempSrc:  Oral Oral Oral  SpO2: 100% 100% 97% 100%  Weight:  60.1 kg    Height:        General: Pt is alert, awake, not in acute distress Cardiovascular:  RRR, S1/S2 +, no rubs, no gallops Respiratory: CTA bilaterally, no wheezing, no rhonchi Abdominal: Soft, NT, ND, bowel sounds + Extremities: no edema, no cyanosis  Discharge Instructions   Allergies as of 05/10/2021       Reactions   Buprenorphine Hcl Shortness Of Breath   Throat swelling/trouble breathing and lethargic   Morphine And Related Shortness Of Breath   Throat swelling/trouble breathing and lethargic   Celebrex [celecoxib] Diarrhea, Swelling   Swelling of legs    Vioxx [rofecoxib] Palpitations   Codeine Other (See Comments)   Bloated         Medication List     STOP taking these medications    pantoprazole 40 MG tablet Commonly known as: PROTONIX       TAKE these medications    acetaminophen 500 MG tablet Commonly known as: TYLENOL Take 2 tablets (1,000 mg total) by mouth every 8 (eight) hours as needed for mild pain or moderate pain.   apixaban 5 MG Tabs tablet Commonly known as: ELIQUIS Take 2 tablets (10 mg total) by mouth 2 (two) times daily for 7 days, THEN 1 tablet (5 mg total) 2 (two) times daily for 23 days. Start taking on: May 17, 2021   aspirin 81 MG EC tablet Take 1 tablet (81 mg total) by mouth daily. Swallow whole. What changed: when to take this   budesonide-formoterol 160-4.5 MCG/ACT inhaler Commonly known as: Symbicort TAKE 2 PUFFS BY MOUTH TWICE A DAY What changed:  how much to take how to take this when to take this additional instructions   busPIRone 5 MG tablet Commonly known as: BUSPAR Take 5 mg by mouth 2 (two) times daily as needed (anxiety).   ciclopirox 8 % solution Commonly known as: Penlac Apply 1 application topically at bedtime. Apply over nail and surrounding skin. Apply daily over previous coat. After seven (7) days, file nail and continue cycle.   donepezil 10 MG tablet Commonly known as: ARICEPT Take 1 tablet (10 mg total) by mouth at bedtime.   ferrous sulfate 325 (65 FE) MG tablet Take 325 mg by  mouth daily with breakfast.   furosemide 20 MG tablet Commonly known as: LASIX Take 1 tablet by mouth Mon - Fri daily What changed:  how much to take how to take this when to take this additional instructions   gabapentin 100 MG capsule Commonly known as: NEURONTIN Take 1 capsule (100 mg total) by mouth 2 (two) times daily.   memantine 5 MG tablet Commonly known as: NAMENDA Take 1 tablet (5 mg total) by mouth daily.   methocarbamol 500 MG tablet Commonly known as: ROBAXIN Take 1 tablet (500 mg total) by mouth every 6 (six) hours as needed for muscle spasms.   metoprolol tartrate 25 MG tablet Commonly known as: LOPRESSOR Take 1 tablet (25 mg total) by mouth 2 (two) times daily.   nitroGLYCERIN 0.4 MG SL tablet Commonly known as: NITROSTAT PLACE ONE TABLET UNDER THE TONGUE EVERY  5 MINUTES AS NEEDED FOR CHEST PAIN. What changed:  how much to take how to take this when to take this reasons to take this additional instructions   omeprazole 20 MG capsule Commonly known as: PRILOSEC Take 20 mg by mouth in the morning and at bedtime.   oxybutynin 5 MG tablet Commonly known as: DITROPAN Take 0.5 tablets (2.5 mg total) by mouth 2 (two) times daily.   oxyCODONE 5 MG immediate release tablet Commonly known as: Oxy IR/ROXICODONE Take 1 tablet (5 mg total) by mouth every 4 (four) hours as needed for moderate pain or severe pain.   polyethylene glycol 17 g packet Commonly known as: MIRALAX / GLYCOLAX Take 17 g by mouth daily as needed for mild constipation.   Potassium Chloride ER 20 MEQ Tbcr Take 2 tablets by mouth every morning. What changed: how much to take   QUEtiapine 50 MG tablet Commonly known as: SEROQUEL TAKE 1 TABLET BY MOUTH EVERYDAY AT BEDTIME What changed:  how much to take how to take this when to take this additional instructions   rosuvastatin 40 MG tablet Commonly known as: CRESTOR Take 1 tablet (40 mg total) by mouth daily.   traMADol 50 MG  tablet Commonly known as: ULTRAM Take 1 tablet (50 mg total) by mouth in the morning, at noon, and at bedtime.        Follow-up Information     [facility] Follow up in 1 week(s).   Contact information: [place] [address] [phone]         Pixie Casino, MD .   Specialty: Cardiology Contact information: [address] [place] [address] [phone]                Allergies  Allergen Reactions   Buprenorphine Hcl Shortness Of Breath    Throat swelling/trouble breathing and lethargic   Morphine And Related Shortness Of Breath    Throat swelling/trouble breathing and lethargic   Celebrex [Celecoxib] Diarrhea and Swelling    Swelling of legs    Vioxx [Rofecoxib] Palpitations   Codeine Other (See Comments)    Bloated     You were cared for by a hospitalist during your hospital stay. If you have any questions about your discharge medications or the care you received while you were in the hospital after you are discharged, you can call the unit and asked to speak with the hospitalist on call if the hospitalist that took care of you is not available. Once you are discharged, your primary care physician will handle any further medical issues. Please note that no refills for any discharge medications will be authorized once you are discharged, as it is imperative that you return to your primary care physician (or establish a relationship with a primary care physician if you do not have one) for your aftercare needs so that they can reassess your need for medications and monitor your lab values.   Procedures/Studies: DG Chest 1 View  Result Date: 04/26/2021 CLINICAL DATA:  [place] [address] out of chair, left hip fracture, non-small cell right lung cancer EXAM: CHEST  1 VIEW COMPARISON:  02/26/2021, 10/28/2020 FINDINGS: Single frontal view of the chest demonstrates a stable cardiac silhouette. Stable right apical mass consistent  with biopsy-proven non-small cell lung cancer. Chronic areas of scarring are seen elsewhere within the mid upper lung zones. No new airspace disease, effusion, or pneumothorax. No acute bony abnormalities. IMPRESSION: 1. Stable right apical mass consistent  with known biopsy-proven non-small cell lung cancer. 2. No acute intrathoracic trauma. Electronically Signed   By: Randa Ngo M.D.   On: 04/26/2021 15:24   DG Pelvis 1-2 Views  Result Date: 05/09/2021 CLINICAL DATA:  History of recent left hip surgery with pelvic pain, initial encounter EXAM: PELVIS - 1 VIEW COMPARISON:  None. FINDINGS: Proximal medullary rod is noted in the left femur with fixation screws traversing the femoral neck. Fracture fragments are in anatomic alignment. Pelvic ring shows superior and inferior pubic rami fractures on the right with some mild callus formation. No new focal abnormality is seen. Postsurgical changes in the lower lumbar spine are noted. IMPRESSION: Changes consistent with recent operative fixation of left femoral fracture. Healing right pubic rami fractures. Electronically Signed   By: Inez Catalina M.D.   On: 05/09/2021 02:38   CT Head Wo Contrast  Result Date: 04/26/2021 CLINICAL DATA:  Neck trauma (Age >= 65y); Head trauma, minor (Age >= 65y) EXAM: CT HEAD WITHOUT CONTRAST CT CERVICAL SPINE WITHOUT CONTRAST TECHNIQUE: Multidetector CT imaging of the head and cervical spine was performed following the standard protocol without intravenous contrast. Multiplanar CT image reconstructions of the cervical spine were also generated. COMPARISON:  CT head February 26, 2021. CT cervical spine November 22, 2020. FINDINGS: CT HEAD FINDINGS Brain: No evidence of acute large vascular infarction, hemorrhage, hydrocephalus, extra-axial collection or mass lesion/mass effect. Similar patchy white matter hypoattenuation, nonspecific but compatible with chronic microvascular ischemic disease. Vascular: No hyperdense vessel identified.  Calcific intracranial atherosclerosis. Skull: No acute fracture. Sinuses/Orbits: Opacified left anterior ethmoid air cell. Otherwise, largely clear sinuses. Unremarkable orbits. Other: No mastoid effusions. CT CERVICAL SPINE FINDINGS Motion limited study.  Within this limitation: Alignment: Similar alignment to the prior. Similar anterolisthesis of C6 on C7 with similar focal kyphosis at this level. Of the lower cervical spine dextrocurvature Skull base and vertebrae: Redemonstrated chronic erosions along the base of the odontoid with posterior pannus. There is also severe erosive change along the right atlantoaxial joint, similar. Cystic degenerative change of the dens, similar. C3-C5 ACDF with corpectomy at C4. No evidence of hardware fracture. Similar alignment. Fused facet joints at C3-C4 and C6-C7. New T2 (20%) and T3 (30%) vertebral body height loss with associated superior endplate sclerosis and lucency through the T3 superior endplate. Findings are partially imaged. Soft tissues and spinal canal: Motion limited evaluation without definite prevertebral edema or visible canal hematoma. Disc levels: Severe multilevel degenerative change. Craniocervical degenerative changes described above. Multilevel bilateral foraminal stenosis. Upper chest: Partially imaged nodular opacities in the posterior right upper lobe, reportedly biopsy-proven non-small cell lung cancer IMPRESSION: CT cervical spine: 1. Acute T2 and T3 compression fractures, described above and partially imaged. Recommend dedicated CT of the thoracic spine to further characterize and to fully evaluate the thoracic spine. 2. Similar postoperative changes of C3-C4 ACDF and C4 corpectomy with similar alignment. 3. Severe multilevel degenerative change, including severe craniocervical degenerative change and multilevel foraminal stenosis. MRI could further characterize the canal/cord/foramina if clinically indicated. 4. Partially imaged nodular opacities  in the posterior right upper lobe, reportedly biopsy-proven non-small cell lung cancer CT head: No evidence of acute intracranial abnormality. Electronically Signed   By: Margaretha Sheffield M.D.   On: 04/26/2021 14:41   CT Angio Chest PE W and/or Wo Contrast  Result Date: 05/09/2021 CLINICAL DATA:  Chest pain and hypoxia, initial encounter EXAM: CT ANGIOGRAPHY CHEST WITH CONTRAST TECHNIQUE: Multidetector CT imaging of the chest was performed using the standard protocol  during bolus administration of intravenous contrast. Multiplanar CT image reconstructions and MIPs were obtained to evaluate the vascular anatomy. CONTRAST:  23mL Isovue 370 COMPARISON:  Chest x-ray from earlier in the same day, 04/26/2021. FINDINGS: Cardiovascular: Thoracic aorta demonstrates atherosclerotic calcifications. No aneurysmal dilatation or dissection is noted. Coronary calcifications are seen. No cardiac enlargement is noted. The pulmonary artery is well visualized within normal branching pattern. Filling defects are noted within the left lower lobe pulmonary arterial branch medially as well as within the right lower lobe pulmonary artery consistent with multifocal pulmonary emboli. No right heart strain is noted. Mediastinum/Nodes: Thoracic inlet is within normal limits. No sizable hilar or mediastinal adenopathy is noted. The esophagus as visualized is within normal limits. Lungs/Pleura: Lungs are well aerated bilaterally. Persistent right apical mass lesion is noted stable from the recent exam. No new focal infiltrate or effusion is seen. Mild emphysematous changes are noted. Upper Abdomen: Postsurgical changes are noted at the GE junction. The remainder of the upper abdomen is within normal limits. Musculoskeletal: Degenerative changes of the thoracic spine are noted. Chronic compression deformities are noted at T3 and T9. Distal sternal fracture is again noted and stable. Old healing rib fractures are noted on the left. No acute  rib abnormality is noted. Review of the MIP images confirms the above findings. IMPRESSION: Bilateral pulmonary emboli in the lower lobes without evidence of right heart strain. Stable right apical mass consistent with the known history of lung carcinoma. No acute abnormality is noted. Aortic Atherosclerosis (ICD10-I70.0) and Emphysema (ICD10-J43.9). Critical Value/emergent results were called by telephone at the time of interpretation on 05/09/2021 at 12:57 am to [facility] , who verbally acknowledged these results. Electronically Signed   By: Inez Catalina M.D.   On: 05/09/2021 01:00   CT Cervical Spine Wo Contrast  Result Date: 04/26/2021 CLINICAL DATA:  Neck trauma (Age >= 65y); Head trauma, minor (Age >= 65y) EXAM: CT HEAD WITHOUT CONTRAST CT CERVICAL SPINE WITHOUT CONTRAST TECHNIQUE: Multidetector CT imaging of the head and cervical spine was performed following the standard protocol without intravenous contrast. Multiplanar CT image reconstructions of the cervical spine were also generated. COMPARISON:  CT head February 26, 2021. CT cervical spine November 22, 2020. FINDINGS: CT HEAD FINDINGS Brain: No evidence of acute large vascular infarction, hemorrhage, hydrocephalus, extra-axial collection or mass lesion/mass effect. Similar patchy white matter hypoattenuation, nonspecific but compatible with chronic microvascular ischemic disease. Vascular: No hyperdense vessel identified. Calcific intracranial atherosclerosis. Skull: No acute fracture. Sinuses/Orbits: Opacified left anterior ethmoid air cell. Otherwise, largely clear sinuses. Unremarkable orbits. Other: No mastoid effusions. CT CERVICAL SPINE FINDINGS Motion limited study.  Within this limitation: Alignment: Similar alignment to the prior. Similar anterolisthesis of C6 on C7 with similar focal kyphosis at this level. Of the lower cervical spine dextrocurvature Skull base and vertebrae: Redemonstrated chronic erosions along the base of the  odontoid with posterior pannus. There is also severe erosive change along the right atlantoaxial joint, similar. Cystic degenerative change of the dens, similar. C3-C5 ACDF with corpectomy at C4. No evidence of hardware fracture. Similar alignment. Fused facet joints at C3-C4 and C6-C7. New T2 (20%) and T3 (30%) vertebral body height loss with associated superior endplate sclerosis and lucency through the T3 superior endplate. Findings are partially imaged. Soft tissues and spinal canal: Motion limited evaluation without definite prevertebral edema or visible canal hematoma. Disc levels: Severe multilevel degenerative change. Craniocervical degenerative changes described above. Multilevel bilateral foraminal stenosis. Upper chest: Partially imaged nodular opacities  in the posterior right upper lobe, reportedly biopsy-proven non-small cell lung cancer IMPRESSION: CT cervical spine: 1. Acute T2 and T3 compression fractures, described above and partially imaged. Recommend dedicated CT of the thoracic spine to further characterize and to fully evaluate the thoracic spine. 2. Similar postoperative changes of C3-C4 ACDF and C4 corpectomy with similar alignment. 3. Severe multilevel degenerative change, including severe craniocervical degenerative change and multilevel foraminal stenosis. MRI could further characterize the canal/cord/foramina if clinically indicated. 4. Partially imaged nodular opacities in the posterior right upper lobe, reportedly biopsy-proven non-small cell lung cancer CT head: No evidence of acute intracranial abnormality. Electronically Signed   By: Margaretha Sheffield M.D.   On: 04/26/2021 14:41   CT CHEST ABDOMEN PELVIS W CONTRAST  Result Date: 04/26/2021 CLINICAL DATA:  Dementia patient found on the floor after a fall. EXAM: CT CHEST, ABDOMEN, AND PELVIS WITH CONTRAST TECHNIQUE: Multidetector CT imaging of the chest, abdomen and pelvis was performed following the standard protocol during bolus  administration of intravenous contrast. CONTRAST:  76mL OMNIPAQUE IOHEXOL 300 MG/ML  SOLN COMPARISON:  10/28/2020 FINDINGS: CT CHEST FINDINGS Cardiovascular: Art size upper limits of normal. No pericardial fluid. Coronary artery calcification and aortic atherosclerotic calcification as seen previously. Mediastinum/Nodes: No mediastinal or hilar mass or lymphadenopathy. Lungs/Pleura: Indistinct mass lesion at the right apex, apparently malignant by biopsy, appears slightly larger. This is difficult to measure precisely because of the morphology. Elsewhere, 6 mm subsolid nodule previously seen in the superior segment of the left lower lobe is unchanged, axial image 37. No progressive feature. No pleural effusion seen on either side. No evidence of infectious pneumonia or pulmonary collapse. Musculoskeletal: No rib fracture on the right. On the left, there are numerous subacute healing rib fractures. There are subacute but somewhat more recent appearing rib fractures posteriorly affecting the left seventh, eighth and ninth ribs. Old appearing compression deformity at T9. Minor superior endplate fracture at T3, not present in June of this year. Sternal fracture which appears subacute but not united, not present in June of this year. CT ABDOMEN PELVIS FINDINGS Hepatobiliary: No liver parenchymal abnormality. Previous cholecystectomy. Chronic biliary ductal prominence. Pancreas: Negative Spleen: Normal Adrenals/Urinary Tract: Adrenal glands are normal. Multiple renal cysts on the left. Small nonobstructing renal calculi. Bladder is normal. Stomach/Bowel: Previous gastric surgery. Left inguinal hernia containing several loops of small intestine. No evidence of obstruction or incarceration. No acute colon finding. Vascular/Lymphatic: Aortic atherosclerosis. No aneurysm. IVC is normal. No retroperitoneal adenopathy. Reproductive: No pelvic mass. Other: No free fluid or air. Musculoskeletal: Distant discectomy and fusion  procedure L4 to the sacrum. Likely nonunion at L5-S1. subacute fracture of the pubic rami on the right, not yet healed. Acute intertrochanteric fracture of the left femur. IMPRESSION: Slight enlargement of an indistinct irregular mass lesion at the right apex, reported to be malignant by prior biopsy. No change in a 6 mm subsolid nodule in the superior segment of the left lower lobe. Multiple subacute rib fractures on the left of varying ages, not yet healed. No sign of acute fracture today. Old compression deformity at T9. More recent superior endplate fracture at T3, not present in June of this year, but probably subacute. Subacute sternal fracture, not yet united. No evidence of acute abdominal organ injury. Aortic atherosclerosis. Left inguinal hernia containing small bowel but no evidence of obstruction or incarceration. Subacute pubic rami fractures on the right. Acute intertrochanteric femoral fracture on the left. 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 Electronically Signed  By: Nelson Chimes M.D.   On: 04/26/2021 16:36   CT T-SPINE NO CHARGE  Result Date: 04/26/2021 CLINICAL DATA:  Fall with back pain EXAM: CT THORACIC SPINE WITHOUT CONTRAST TECHNIQUE: Multidetector CT images of the thoracic were obtained using the standard protocol without intravenous contrast. COMPARISON:  Cervical CT 11/22/2020.  CT chest 10/28/2020. FINDINGS: Alignment: Increased kyphotic curvature. Vertebrae: Old compression fracture at T9, not progressed since June. Newly seen superior endplate fractures at T2, T3 and T4, likely subacute. Loss of height 20% or less at those levels. No acute thoracic region fracture identified. Paraspinal and other soft tissues: Negative Disc levels: No significant thoracic degenerative disease. No bony stenosis of the canal or foramina. IMPRESSION: No acute thoracic spine fracture suspected. Old fracture of T9. Likely subacute fractures at T2, T3 and T4, with only minimal loss of height.  Electronically Signed   By: Nelson Chimes M.D.   On: 04/26/2021 16:38   CT L-SPINE NO CHARGE  Result Date: 04/26/2021 CLINICAL DATA:  [place].  Assess for lumbar compression fracture. EXAM: CT LUMBAR SPINE WITHOUT CONTRAST TECHNIQUE: Multidetector CT imaging of the lumbar spine was performed without intravenous contrast administration. Multiplanar CT image reconstructions were also generated. COMPARISON:  MRI 02/27/2021 FINDINGS: Segmentation: 5 lumbar type vertebral bodies. Alignment: Fixed anterolisthesis at L4-5 of 8 mm. Anterolisthesis at L5-S1 of 7 mm. Vertebrae: No lumbar region fracture. Likely solid union at the L4-5 level. Nonunion at L5-S1, with pronounced lucency surrounding the sacral screws and nitrogen gas in the L5-S1 disc level. Paraspinal and other soft tissues: Negative Disc levels: L1-2 and L2-3 are negative. L3-4 shows mild bulging of the disc and facet hypertrophy. See above regarding the surgical region from L4 to the sacrum. IMPRESSION: No acute traumatic finding in the lumbar region. Previous discectomy and fusion procedure from L4 to the sacrum. 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 Electronically Signed   By: Nelson Chimes M.D.   On: 04/26/2021 16:41   DG Chest Port 1 View  Result Date: 05/08/2021 CLINICAL DATA:  Dyspnea, chest pain, shortness of breath, history CHF, COPD, coronary artery disease post MI, hypertension, smoker EXAM: PORTABLE CHEST 1 VIEW COMPARISON:  Portable exam 2021 hours compared to 04/26/2021 FINDINGS: Upper normal heart size. Mediastinal contours and pulmonary vascularity normal. Persistent RIGHT apical mass, by prior report a biopsy-proven non-small cell lung cancer. Skin folds project over the chest bilaterally. Chronic accentuation of RIGHT upper lobe markings. No acute infiltrate, pleural effusion or pneumothorax. RIGHT shoulder prosthesis and advanced LEFT glenohumeral degenerative  changes noted. IMPRESSION: Known RIGHT apex lung cancer. Chronic lung changes without acute abnormalities. Electronically Signed   By: Lavonia Dana M.D.   On: 05/08/2021 20:52   DG Knee Left Port  Result Date: 04/26/2021 CLINICAL DATA:  Un witnessed fall, left knee pain EXAM: PORTABLE LEFT KNEE - 1-2 VIEW COMPARISON:  02/06/2006 FINDINGS: Frontal and lateral views of the left knee demonstrate 3 component left knee arthroplasty in the expected position without signs of acute complication. There are no acute displaced fractures. Small joint effusion. Diffuse soft tissue edema. IMPRESSION: 1. No acute displaced fracture. 2. Diffuse soft tissue edema and trace left knee effusion. 3. Unremarkable 3 component left knee arthroplasty. Electronically Signed   By: Randa Ngo M.D.   On: 04/26/2021 17:05   DG C-Arm 1-60 Min-No Report  Result Date: 04/27/2021 CLINICAL DATA:  Surgical internal fixation of left proximal  femur fracture. EXAM: OPERATIVE left HIP (WITH PELVIS IF PERFORMED) 5 VIEWS TECHNIQUE: Fluoroscopic spot image(s) were submitted for interpretation post-operatively. Radiation exposure index: 15.52 mGy. COMPARISON:  April 26, 2021. FINDINGS: Five intraoperative fluoroscopic images were obtained of the left hip. These images demonstrate surgical internal fixation of proximal left femoral intertrochanteric fracture. IMPRESSION: Fluoroscopic guidance provided during surgical internal fixation of proximal left femoral intertrochanteric fracture. Electronically Signed   By: Marijo Conception M.D.   On: 04/27/2021 16:01   ECHOCARDIOGRAM COMPLETE  Result Date: 05/09/2021    ECHOCARDIOGRAM REPORT   Patient Name:   Sara Fox Date of Exam: 05/09/2021 Medical Rec #:  [id number]      Height:       64.0 in Accession #:    [id number]     Weight:       140.7 lb Date of Birth:  [birth year]      BSA:          1.685 m Patient Age:    61 years       BP:           107/56 mmHg Patient Gender: F              HR:            65 bpm. Exam Location:  Inpatient Procedure: 2D Echo, Cardiac Doppler and Color Doppler Indications:    Pulmonary Embolus  History:        Patient has prior history of Echocardiogram examinations, most                 recent 11/23/2020. CHF, Previous Myocardial Infarction and CAD,                 Signs/Symptoms:Altered Mental Status; Risk Factors:Hypertension,                 Dyslipidemia and Former Smoker.  Sonographer:    Wenda Low Referring Phys: [place]  Sonographer Comments: Image acquisition challenging due to uncooperative patient. IMPRESSIONS  1. Left ventricular ejection fraction, by estimation, is 60 to 65%. The left ventricle has normal function. The left ventricle has no regional wall motion abnormalities. Left ventricular diastolic parameters are consistent with Grade I diastolic dysfunction (impaired relaxation).  2. Right ventricular systolic function is normal. The right ventricular size is normal.  3. Left atrial size was mildly dilated.  4. Right atrial size was mildly dilated.  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 Trivial mitral valve regurgitation.  6. The aortic valve is tricuspid. There is mild calcification of the aortic valve. There is mild thickening of the aortic valve. Aortic valve regurgitation is not visualized. Aortic valve sclerosis/calcification is present, without any evidence of aortic stenosis. Comparison(s): No significant change from prior study. Prior images reviewed side by side. FINDINGS  Left Ventricle: Left ventricular ejection fraction, by estimation, is 60 to 65%. The left ventricle has normal function. The left ventricle has no regional wall motion abnormalities. The left ventricular internal cavity size was normal in size. There is  no left ventricular hypertrophy. Left ventricular diastolic parameters are consistent with Grade I diastolic dysfunction (impaired relaxation). Right Ventricle: The right ventricular size is normal. No increase in  right ventricular wall thickness. Right ventricular systolic function is normal. Left Atrium: Left atrial size was mildly dilated. Right Atrium: Right atrial size was mildly dilated. Pericardium: There is no evidence of pericardial effusion. Mitral Valve: The mitral valve is myxomatous. Mild mitral annular calcification. Trivial  mitral valve regurgitation. MV peak gradient, 6.8 mmHg. The mean mitral valve gradient is 2.0 mmHg. Tricuspid Valve: The tricuspid valve is normal in structure. Tricuspid valve regurgitation is not demonstrated. Aortic Valve: The aortic valve is tricuspid. There is mild calcification of the aortic valve. There is mild thickening of the aortic valve. Aortic valve regurgitation is not visualized. Aortic valve sclerosis/calcification is present, without any evidence of aortic stenosis. Aortic valve mean gradient measures 3.0 mmHg. Aortic valve peak gradient measures 6.6 mmHg. Aortic valve area, by VTI measures 2.12 cm. Pulmonic Valve: The pulmonic valve was not well visualized. Pulmonic valve regurgitation is not visualized. Aorta: The aortic root is normal in size and structure. IAS/Shunts: No atrial level shunt detected by color flow Doppler.  LEFT VENTRICLE PLAX 2D LVIDd:         4.10 cm   Diastology LVIDs:         2.70 cm   LV e' medial:    6.96 cm/s LV PW:         0.70 cm   LV E/e' medial:  11.2 LV IVS:        0.90 cm   LV e' lateral:   5.44 cm/s LVOT diam:     1.90 cm   LV E/e' lateral: 14.4 LV SV:         61 LV SV Index:   36 LVOT Area:     2.84 cm  RIGHT VENTRICLE RV Basal diam:  3.25 cm RV Mid diam:    3.10 cm RV S prime:     9.68 cm/s TAPSE (M-mode): 2.4 cm LEFT ATRIUM             Index        RIGHT ATRIUM           Index LA diam:        3.20 cm 1.90 cm/m   RA Area:     20.50 cm LA Vol (A2C):   49.6 ml 29.44 ml/m  RA Volume:   65.10 ml  38.65 ml/m LA Vol (A4C):   44.1 ml 26.18 ml/m LA Biplane Vol: 47.4 ml 28.14 ml/m  AORTIC VALVE                    PULMONIC VALVE AV Area  (Vmax):    2.44 cm     PV Vmax:       0.73 m/s AV Area (Vmean):   2.22 cm     PV Peak grad:  2.2 mmHg AV Area (VTI):     2.12 cm AV Vmax:           128.00 cm/s AV Vmean:          84.800 cm/s AV VTI:            0.286 m AV Peak Grad:      6.6 mmHg AV Mean Grad:      3.0 mmHg LVOT Vmax:         110.00 cm/s LVOT Vmean:        66.300 cm/s LVOT VTI:          0.214 m LVOT/AV VTI ratio: 0.75  AORTA Ao Root diam: 3.10 cm MITRAL VALVE MV Area (PHT): 2.55 cm     SHUNTS MV Area VTI:   1.33 cm     Systemic VTI:  0.21 m MV Peak grad:  6.8 mmHg     Systemic Diam: 1.90 cm MV Mean grad:  2.0 mmHg MV  Vmax:       1.30 m/s MV Vmean:      70.9 cm/s MV Decel Time: 298 msec MV E velocity: 78.10 cm/s MV A velocity: 118.00 cm/s MV E/A ratio:  0.66 Mihai Croitoru MD Electronically signed by Sanda Klein MD Signature Date/Time: 05/09/2021/1:16:06 PM    Final    DG HIP PORT UNILAT W OR W/O PELVIS 1V LEFT  Result Date: 04/27/2021 CLINICAL DATA:  Hip fracture EXAM: DG HIP (WITH OR WITHOUT PELVIS) 1V PORT LEFT COMPARISON:  04/26/2021 FINDINGS: Interval intramedullary rod and distal screw fixation of the left femur for intertrochanteric fracture. There is anatomic alignment. Gas in the soft tissues consistent with recent surgery. IMPRESSION: Interval surgical fixation of left intertrochanteric fracture with expected postsurgical change Electronically Signed   By: Donavan Foil M.D.   On: 04/27/2021 18:39   DG HIP OPERATIVE UNILAT WITH PELVIS LEFT  Result Date: 04/27/2021 CLINICAL DATA:  Surgical internal fixation of left proximal femur fracture. EXAM: OPERATIVE left HIP (WITH PELVIS IF PERFORMED) 5 VIEWS TECHNIQUE: Fluoroscopic spot image(s) were submitted for interpretation post-operatively. Radiation exposure index: 15.52 mGy. COMPARISON:  April 26, 2021. FINDINGS: Five intraoperative fluoroscopic images were obtained of the left hip. These images demonstrate surgical internal fixation of proximal left femoral  intertrochanteric fracture. IMPRESSION: Fluoroscopic guidance provided during surgical internal fixation of proximal left femoral intertrochanteric fracture. Electronically Signed   By: Marijo Conception M.D.   On: 04/27/2021 16:01   DG Hip Unilat With Pelvis 2-3 Views Left  Result Date: 04/26/2021 CLINICAL DATA:  [place], left hip deformity EXAM: DG HIP (WITH OR WITHOUT PELVIS) 2-3V LEFT COMPARISON:  02/27/2021 FINDINGS: Frontal view of the pelvis as well as frontal and cross-table lateral views of the left hip are obtained. There is a displaced intertrochanteric left hip fracture, with mild impaction and varus angulation. No dislocation. There are fractures through the right superior and inferior pubic rami which are new since prior study. There is some sclerosis along the margins of these fractures and possible periosteal reaction, which could suggest subacute injury. Fracture lines are still readily apparent [facility] for. The bones are diffusely osteopenic. Sacroiliac joints are unremarkable. Stable postsurgical changes at the lumbosacral junction. IMPRESSION: 1. Acute intertrochanteric left hip fracture, with mild impaction and varus angulation. 2. Acute to subacute fractures through the right superior and inferior pubic rami. 3. Diffuse osteopenia. Electronically Signed   By: Randa Ngo M.D.   On: 04/26/2021 15:21   VAS [place] LOWER EXTREMITY VENOUS (DVT)  Result Date: 05/09/2021  Lower Venous DVT Study Patient Name:  Sara Fox  Date of Exam:   05/09/2021 Medical Rec #: [id number]       Accession #:    [id number] Date of Birth: [birth year]       Patient Gender: F Patient Age:   56 years Exam Location:  [facility] Procedure:      VAS [place] LOWER EXTREMITY VENOUS (DVT) Referring Phys: Gean Birchwood --------------------------------------------------------------------------------  Indications: Pulmonary embolism, and SOB.  Risk Factors: Left hip fracture 10 days ago. Limitations: Confusion,  positioning. Comparison Study: Prior negative bilateral LEV done 07/11/2017 Performing Technologist: Sharion Dove RVS  Examination Guidelines: A complete evaluation includes B-mode imaging, spectral Doppler, color Doppler, and power Doppler as needed of all accessible portions of each vessel. Bilateral testing is considered an integral part of a complete examination. Limited examinations for reoccurring indications may be performed as noted. The reflux portion of the exam is performed with the patient in  reverse Trendelenburg.  +---------+---------------+---------+-----------+----------+-------------------+  RIGHT     Compressibility Phasicity Spontaneity Properties Thrombus Aging       +---------+---------------+---------+-----------+----------+-------------------+  CFV       Full            Yes       Yes                                         +---------+---------------+---------+-----------+----------+-------------------+  SFJ       Full                                                                  +---------+---------------+---------+-----------+----------+-------------------+  FV Prox   Full                                                                  +---------+---------------+---------+-----------+----------+-------------------+  FV Mid    Full                                                                  +---------+---------------+---------+-----------+----------+-------------------+  FV Distal Full                                                                  +---------+---------------+---------+-----------+----------+-------------------+  PFV       Full                                                                  +---------+---------------+---------+-----------+----------+-------------------+  POP                       Yes       Yes                    patent by color and                                                              Doppler               +---------+---------------+---------+-----------+----------+-------------------+  PTV       Full                                                                  +---------+---------------+---------+-----------+----------+-------------------+  PERO      None                                             Age Indeterminate    +---------+---------------+---------+-----------+----------+-------------------+   +---------+---------------+---------+-----------+----------+-----------------+  LEFT      Compressibility Phasicity Spontaneity Properties Thrombus Aging     +---------+---------------+---------+-----------+----------+-----------------+  CFV       Full            Yes       Yes                                       +---------+---------------+---------+-----------+----------+-----------------+  SFJ       Full                                                                +---------+---------------+---------+-----------+----------+-----------------+  FV Prox   Full                                                                +---------+---------------+---------+-----------+----------+-----------------+  FV Mid    Full                                                                +---------+---------------+---------+-----------+----------+-----------------+  FV Distal Full                                                                +---------+---------------+---------+-----------+----------+-----------------+  PFV       Full                                                                +---------+---------------+---------+-----------+----------+-----------------+  POP       Full            Yes       Yes                                       +---------+---------------+---------+-----------+----------+-----------------+  PTV       Full                                                                +---------+---------------+---------+-----------+----------+-----------------+  PERO      None                                              Age Indeterminate  +---------+---------------+---------+-----------+----------+-----------------+     Summary: BILATERAL: -No evidence of popliteal cyst, bilaterally. RIGHT: - Findings consistent with age indeterminate deep vein thrombosis involving the right peroneal veins.  LEFT: - Findings consistent with age indeterminate deep vein thrombosis involving the left peroneal veins.  *See table(s) above for measurements and observations. Electronically signed by Jamelle Haring on 05/09/2021 at 1:06:48 PM.    Final      The results of significant diagnostics from this hospitalization (including imaging, microbiology, ancillary and laboratory) are listed below for reference.     Microbiology: Recent Results (from the past 240 hour(s))  Resp Panel by RT-PCR (Flu A&B, Covid) Nasopharyngeal Swab     Status: None   Collection Time: 05/02/21 11:48 AM   Specimen: Nasopharyngeal Swab; Nasopharyngeal(NP) swabs in vial transport medium  Result Value Ref Range Status   SARS Coronavirus 2 by RT PCR NEGATIVE NEGATIVE Final    Comment: (NOTE) SARS-CoV-2 target nucleic acids are NOT DETECTED.  The SARS-CoV-2 RNA is generally detectable in upper respiratory specimens during the acute phase of infection. The lowest concentration of SARS-CoV-2 viral copies this assay can detect is 138 copies/mL. A negative result does not preclude SARS-Cov-2 infection and should not be used as the sole basis for treatment or other patient management decisions. A negative result may occur with  improper specimen collection/handling, submission of specimen other than nasopharyngeal swab, presence of viral mutation(s) within the areas targeted by this assay, and inadequate number of viral copies(<138 copies/mL). A negative result must be combined with clinical observations, patient history, and epidemiological information. The expected result is Negative.  Fact Sheet for Patients:   EntrepreneurPulse.com.au  Fact Sheet for Healthcare Providers:  IncredibleEmployment.be  This test is no t yet approved or cleared by the [place] FDA and  has been authorized for detection and/or diagnosis of SARS-CoV-2 by FDA under an Emergency Use Authorization (EUA). This EUA will remain  in effect (meaning this test can be used) for the duration of the COVID-19 declaration under Section 564(b)(1) of the Act, 21 U.S.C.section 360bbb-3(b)(1), unless the authorization is terminated  or revoked sooner.       Influenza A by PCR NEGATIVE NEGATIVE Final   Influenza B by PCR NEGATIVE NEGATIVE Final    Comment: (NOTE) The Xpert Xpress SARS-CoV-2/FLU/RSV plus assay is intended as an aid in the diagnosis of influenza from Nasopharyngeal swab specimens and should not be used as a sole basis for treatment. Nasal washings and aspirates are unacceptable for Xpert Xpress SARS-CoV-2/FLU/RSV testing.  Fact Sheet for Patients: EntrepreneurPulse.com.au  Fact Sheet for Healthcare Providers: IncredibleEmployment.be  This test is not yet approved or cleared by the [place] FDA and has been authorized for detection and/or diagnosis of SARS-CoV-2 by FDA under an Emergency Use Authorization (EUA). This EUA will remain in effect (meaning this test can be used) for the duration of the COVID-19 declaration under Section 564(b)(1) of the Act, 21 U.S.C. section 360bbb-3(b)(1), unless the authorization is terminated or revoked.  Performed at [facility], [place] [address], [place], [place] [address]   Resp Panel by RT-PCR (Flu A&B, Covid) Nasopharyngeal Swab     Status: None  Collection Time: 05/08/21  8:04 PM   Specimen: Nasopharyngeal Swab; Nasopharyngeal(NP) swabs in vial transport medium  Result Value Ref Range Status   SARS Coronavirus 2 by RT PCR NEGATIVE NEGATIVE Final    Comment: (NOTE) SARS-CoV-2  target nucleic acids are NOT DETECTED.  The SARS-CoV-2 RNA is generally detectable in upper respiratory specimens during the acute phase of infection. The lowest concentration of SARS-CoV-2 viral copies this assay can detect is 138 copies/mL. A negative result does not preclude SARS-Cov-2 infection and should not be used as the sole basis for treatment or other patient management decisions. A negative result may occur with  improper specimen collection/handling, submission of specimen other than nasopharyngeal swab, presence of viral mutation(s) within the areas targeted by this assay, and inadequate number of viral copies(<138 copies/mL). A negative result must be combined with clinical observations, patient history, and epidemiological information. The expected result is Negative.  Fact Sheet for Patients:  EntrepreneurPulse.com.au  Fact Sheet for Healthcare Providers:  IncredibleEmployment.be  This test is no t yet approved or cleared by the [place] FDA and  has been authorized for detection and/or diagnosis of SARS-CoV-2 by FDA under an Emergency Use Authorization (EUA). This EUA will remain  in effect (meaning this test can be used) for the duration of the COVID-19 declaration under Section 564(b)(1) of the Act, 21 U.S.C.section 360bbb-3(b)(1), unless the authorization is terminated  or revoked sooner.       Influenza A by PCR NEGATIVE NEGATIVE Final   Influenza B by PCR NEGATIVE NEGATIVE Final    Comment: (NOTE) The Xpert Xpress SARS-CoV-2/FLU/RSV plus assay is intended as an aid in the diagnosis of influenza from Nasopharyngeal swab specimens and should not be used as a sole basis for treatment. Nasal washings and aspirates are unacceptable for Xpert Xpress SARS-CoV-2/FLU/RSV testing.  Fact Sheet for Patients: EntrepreneurPulse.com.au  Fact Sheet for Healthcare  Providers: IncredibleEmployment.be  This test is not yet approved or cleared by the [place] FDA and has been authorized for detection and/or diagnosis of SARS-CoV-2 by FDA under an Emergency Use Authorization (EUA). This EUA will remain in effect (meaning this test can be used) for the duration of the COVID-19 declaration under Section 564(b)(1) of the Act, 21 U.S.C. section 360bbb-3(b)(1), unless the authorization is terminated or revoked.  Performed at [facility], [place] [address], [place], [place] [address]   MRSA Next Gen by PCR, Nasal     Status: None   Collection Time: 05/09/21  6:12 PM   Specimen: Nasal Mucosa; Nasal Swab  Result Value Ref Range Status   MRSA by PCR Next Gen NOT DETECTED NOT DETECTED Final    Comment: (NOTE) The GeneXpert MRSA Assay (FDA approved for NASAL specimens only), is one component of a comprehensive MRSA colonization surveillance program. It is not intended to diagnose MRSA infection nor to guide or monitor treatment for MRSA infections. Test performance is not FDA approved in patients less than 49 years old. Performed at [facility], [place] [address], [place], [place] [address]      Labs: BNP (last 3 results) Recent Labs    11/22/20 1354 02/27/21 0504 05/08/21 2004  BNP 51.3 63.7 40.0   Basic Metabolic Panel: Recent Labs  Lab 05/08/21 2004 05/08/21 2012 05/09/21 0731 05/10/21 0401  NA 136 140 135 136  K 4.2 4.2 4.8 4.8  CL 112*  --  109 110  CO2 18*  --  20* 18*  GLUCOSE 102*  --  96 91  BUN 31*  --  26* 26*  CREATININE 1.27*  --  1.06* 1.14*  CALCIUM 8.7*  --  8.8* 8.9  MG  --   --   --  2.1   Liver Function Tests: Recent Labs  Lab 05/08/21 2004 05/09/21 0731  AST 22 21  ALT 18 20  ALKPHOS 205* 190*  BILITOT 0.5 0.5  PROT 6.3* 6.1*  ALBUMIN 2.6* 2.6*   No results for input(s): LIPASE, AMYLASE in the last 168 hours. No results for input(s): AMMONIA in the last 168  hours. CBC: Recent Labs  Lab 05/08/21 2004 05/08/21 2012 05/09/21 0731 05/10/21 0401  WBC 5.2  --  6.1 5.0  NEUTROABS 3.7  --   --   --   HGB 9.0* 10.5* 9.6* 8.4*  HCT 31.0* 31.0* 33.0* 27.9*  MCV 101.3*  --  100.3* 96.5  PLT 467*  --  540* 535*   Cardiac Enzymes: No results for input(s): CKTOTAL, CKMB, CKMBINDEX, TROPONINI in the last 168 hours. BNP: Invalid input(s): POCBNP CBG: No results for input(s): GLUCAP in the last 168 hours. D-Dimer No results for input(s): DDIMER in the last 72 hours. Hgb A1c No results for input(s): HGBA1C in the last 72 hours. Lipid Profile No results for input(s): CHOL, HDL, LDLCALC, TRIG, CHOLHDL, LDLDIRECT in the last 72 hours. Thyroid function studies No results for input(s): TSH, T4TOTAL, T3FREE, THYROIDAB in the last 72 hours.  Invalid input(s): FREET3 Anemia work up No results for input(s): VITAMINB12, FOLATE, FERRITIN, TIBC, IRON, RETICCTPCT in the last 72 hours. Urinalysis    Component Value Date/Time   COLORURINE YELLOW 05/08/2021 2010   APPEARANCEUR CLEAR 05/08/2021 2010   LABSPEC 1.015 05/08/2021 2010   PHURINE 6.5 05/08/2021 2010   GLUCOSEU NEGATIVE 05/08/2021 2010   HGBUR NEGATIVE 05/08/2021 2010   [place] 05/08/2021 2010   BILIRUBINUR Negative 12/15/2020 [place] 05/08/2021 2010   PROTEINUR NEGATIVE 05/08/2021 2010   UROBILINOGEN 0.2 12/15/2020 1551   UROBILINOGEN 0.2 09/12/2014 2302   NITRITE NEGATIVE 05/08/2021 2010   LEUKOCYTESUR NEGATIVE 05/08/2021 2010   Sepsis Labs Invalid input(s): PROCALCITONIN,  WBC,  LACTICIDVEN Microbiology Recent Results (from the past 240 hour(s))  Resp Panel by RT-PCR (Flu A&B, Covid) Nasopharyngeal Swab     Status: None   Collection Time: 05/02/21 11:48 AM   Specimen: Nasopharyngeal Swab; Nasopharyngeal(NP) swabs in vial transport medium  Result Value Ref Range Status   SARS Coronavirus 2 by RT PCR NEGATIVE NEGATIVE Final    Comment: (NOTE) SARS-CoV-2  target nucleic acids are NOT DETECTED.  The SARS-CoV-2 RNA is generally detectable in upper respiratory specimens during the acute phase of infection. The lowest concentration of SARS-CoV-2 viral copies this assay can detect is 138 copies/mL. A negative result does not preclude SARS-Cov-2 infection and should not be used as the sole basis for treatment or other patient management decisions. A negative result may occur with  improper specimen collection/handling, submission of specimen other than nasopharyngeal swab, presence of viral mutation(s) within the areas targeted by this assay, and inadequate number of viral copies(<138 copies/mL). A negative result must be combined with clinical observations, patient history, and epidemiological information. The expected result is Negative.  Fact Sheet for Patients:  EntrepreneurPulse.com.au  Fact Sheet for Healthcare Providers:  IncredibleEmployment.be  This test is no t yet approved or cleared by the [place] FDA and  has been authorized for detection and/or diagnosis of SARS-CoV-2 by FDA under an Emergency Use  Authorization (EUA). This EUA will remain  in effect (meaning this test can be used) for the duration of the COVID-19 declaration under Section 564(b)(1) of the Act, 21 U.S.C.section 360bbb-3(b)(1), unless the authorization is terminated  or revoked sooner.       Influenza A by PCR NEGATIVE NEGATIVE Final   Influenza B by PCR NEGATIVE NEGATIVE Final    Comment: (NOTE) The Xpert Xpress SARS-CoV-2/FLU/RSV plus assay is intended as an aid in the diagnosis of influenza from Nasopharyngeal swab specimens and should not be used as a sole basis for treatment. Nasal washings and aspirates are unacceptable for Xpert Xpress SARS-CoV-2/FLU/RSV testing.  Fact Sheet for Patients: EntrepreneurPulse.com.au  Fact Sheet for Healthcare  Providers: IncredibleEmployment.be  This test is not yet approved or cleared by the [place] FDA and has been authorized for detection and/or diagnosis of SARS-CoV-2 by FDA under an Emergency Use Authorization (EUA). This EUA will remain in effect (meaning this test can be used) for the duration of the COVID-19 declaration under Section 564(b)(1) of the Act, 21 U.S.C. section 360bbb-3(b)(1), unless the authorization is terminated or revoked.  Performed at [facility], [place] [address] [place], [place] [address]   Resp Panel by RT-PCR (Flu A&B, Covid) Nasopharyngeal Swab     Status: None   Collection Time: 05/08/21  8:04 PM   Specimen: Nasopharyngeal Swab; Nasopharyngeal(NP) swabs in vial transport medium  Result Value Ref Range Status   SARS Coronavirus 2 by RT PCR NEGATIVE NEGATIVE Final    Comment: (NOTE) SARS-CoV-2 target nucleic acids are NOT DETECTED.  The SARS-CoV-2 RNA is generally detectable in upper respiratory specimens during the acute phase of infection. The lowest concentration of SARS-CoV-2 viral copies this assay can detect is 138 copies/mL. A negative result does not preclude SARS-Cov-2 infection and should not be used as the sole basis for treatment or other patient management decisions. A negative result may occur with  improper specimen collection/handling, submission of specimen other than nasopharyngeal swab, presence of viral mutation(s) within the areas targeted by this assay, and inadequate number of viral copies(<138 copies/mL). A negative result must be combined with clinical observations, patient history, and epidemiological information. The expected result is Negative.  Fact Sheet for Patients:  EntrepreneurPulse.com.au  Fact Sheet for Healthcare Providers:  IncredibleEmployment.be  This test is no t yet approved or cleared by the [place] FDA and  has been authorized for  detection and/or diagnosis of SARS-CoV-2 by FDA under an Emergency Use Authorization (EUA). This EUA will remain  in effect (meaning this test can be used) for the duration of the COVID-19 declaration under Section 564(b)(1) of the Act, 21 U.S.C.section 360bbb-3(b)(1), unless the authorization is terminated  or revoked sooner.       Influenza A by PCR NEGATIVE NEGATIVE Final   Influenza B by PCR NEGATIVE NEGATIVE Final    Comment: (NOTE) The Xpert Xpress SARS-CoV-2/FLU/RSV plus assay is intended as an aid in the diagnosis of influenza from Nasopharyngeal swab specimens and should not be used as a sole basis for treatment. Nasal washings and aspirates are unacceptable for Xpert Xpress SARS-CoV-2/FLU/RSV testing.  Fact Sheet for Patients: EntrepreneurPulse.com.au  Fact Sheet for Healthcare Providers: IncredibleEmployment.be  This test is not yet approved or cleared by the [place] FDA and has been authorized for detection and/or diagnosis of SARS-CoV-2 by FDA under an Emergency Use Authorization (EUA). This EUA will remain in effect (meaning this test can be used) for the duration of the COVID-19 declaration  under Section 564(b)(1) of the Act, 21 U.S.C. section 360bbb-3(b)(1), unless the authorization is terminated or revoked.  Performed at [facility], [place] [address] [place], [place] [address]   MRSA Next Gen by PCR, Nasal     Status: None   Collection Time: 05/09/21  6:12 PM   Specimen: Nasal Mucosa; Nasal Swab  Result Value Ref Range Status   MRSA by PCR Next Gen NOT DETECTED NOT DETECTED Final    Comment: (NOTE) The GeneXpert MRSA Assay (FDA approved for NASAL specimens only), is one component of a comprehensive MRSA colonization surveillance program. It is not intended to diagnose MRSA infection nor to guide or monitor treatment for MRSA infections. Test performance is not FDA approved in patients less than 51  years old. Performed at [facility], [place] [address] [place], [place] [address]      Time coordinating discharge:  I have spent 35 minutes face to face with the patient and on the ward discussing the patients care, assessment, plan and disposition with other care givers. >50% of the time was devoted counseling the patient about the risks and benefits of treatment/Discharge disposition and coordinating care.   SIGNED:   Damita Lack, MD  [facility] 05/10/2021, 12:09 PM   If 7PM-7AM, please contact night-coverage

## 2021-05-10 NOTE — TOC Benefit Eligibility Note (Signed)
Patient Teacher, English as a foreign language completed.    The patient is currently admitted and upon discharge could be taking Eliquis 5 mg.  The current 30 day co-pay is, $9.85.   The patient is currently admitted and upon discharge could be taking Xarelto 20 mg.  The current 30 day co-pay is, $9.85.   The patient is insured through [place], [place] Patient Advocate Specialist [place] Patient Advocate Team Direct Number: [phone]  Fax: [phone]

## 2021-05-10 NOTE — Progress Notes (Signed)
PT Cancellation Note  Patient Details Name: Sara Fox MRN: [id number] DOB: [birth year]   Cancelled Treatment:    Reason Eval/Treat Not Completed: (P) Patient declined, no reason specified Pt refuses PT eval as she is still eating lunch. Pt tried 45 min ago. PT will try to follow back later this afternoon.  Tyeson Tanimoto B. Migdalia Dk PT, DPT Acute Rehabilitation Services Pager [phone] Office [phone]   [place] 05/10/2021, 12:56 PM

## 2021-05-10 NOTE — Evaluation (Signed)
Occupational Therapy Evaluation Patient Details Name: Sara Fox MRN: [id number] DOB: Jun 28, 1940 Today's Date: 05/10/2021   History of Present Illness Pt is an 80 y/o F presenting from home after unwitnessed fall 04/26/21, found to have L hip fracture, s/p IM nail on 12/14 WBAT post op. PMH includes dementia, COPD, CHF, CKD, stage IV lung cancer s/p SBRT.   Clinical Impression   Pt admitted from SNF rehab with above diagnoses and deficits in cognition, strength, standing balance and pain. Pt lethargic on entry but agreeable for OOB d/t soiled linens. Pt overall Max A for basic transfers using RW with noted L LE buckling, placing pt at high risk for falls. Pt overall Mod A for UB ADLs and Total A for LB ADLs, limited by bladder/bowel incontinence during session. Based on current presentation, recommend DC back to SNF for continued rehab.       Recommendations for follow up therapy are one component of a multi-disciplinary discharge planning process, led by the attending physician.  Recommendations may be updated based on patient status, additional functional criteria and insurance authorization.   Follow Up Recommendations  Skilled nursing-short term rehab (<3 hours/day)    Assistance Recommended at Discharge Frequent or constant Supervision/Assistance  Functional Status Assessment  Patient has had a recent decline in their functional status and demonstrates the ability to make significant improvements in function in a reasonable and predictable amount of time.  Equipment Recommendations  Wheelchair (measurements OT);Wheelchair cushion (measurements OT)    Recommendations for Other Services       Precautions / Restrictions Precautions Precautions: Fall;Other (comment) Precaution Comments: incontinent Restrictions Weight Bearing Restrictions: Yes LLE Weight Bearing: Weight bearing as tolerated      Mobility Bed Mobility Overal bed mobility: Needs Assistance Bed Mobility:  Supine to Sit     Supine to sit: Max assist;HOB elevated     General bed mobility comments: some assist to lift trunk but assist needed to scoot hips and iniitate LEs to EOB    Transfers Overall transfer level: Needs assistance Equipment used: Rolling walker (2 wheels) Transfers: Sit to/from Stand;Bed to chair/wheelchair/BSC Sit to Stand: Mod assist Stand pivot transfers: Max assist         General transfer comment: to [facility] and then to recliner, L LE giving out and requires Max A to [employer] RW, maintain balance and swing hips to surfaces      Balance Overall balance assessment: Needs assistance Sitting-balance support: Bilateral upper extremity supported;Feet supported Sitting balance-Leahy Scale: Fair     Standing balance support: Bilateral upper extremity supported;During functional activity Standing balance-Leahy Scale: Poor                             ADL either performed or assessed with clinical judgement   ADL Overall ADL's : Needs assistance/impaired Eating/Feeding: Supervision/ safety;Sitting   Grooming: Minimal assistance;Sitting   Upper Body Bathing: Moderate assistance;Sitting   Lower Body Bathing: Total assistance;Sit to/from stand   Upper Body Dressing : Moderate assistance;Sitting   Lower Body Dressing: Total assistance;Sit to/from stand;Bed level Lower Body Dressing Details (indicate cue type and reason): to don socks Toilet Transfer: Stand-pivot;BSC/3in1;Rolling walker (2 wheels);Maximal assistance Toilet Transfer Details (indicate cue type and reason): max cues for safety, assist to swing hips due to L LE giving out Toileting- Clothing Manipulation and Hygiene: Total assistance;Sit to/from stand Toileting - Clothing Manipulation Details (indicate cue type and reason): for peri care after purewick malfunction and BM  mid transfer       General ADL Comments: limited by L LE buckling and baseline dementia placing pt at high risk for falls      Vision Ability to See in Adequate Light: 1 Impaired Patient Visual Report: No change from baseline Vision Assessment?: No apparent visual deficits     Perception     Praxis      Pertinent Vitals/Pain Pain Assessment: Faces Faces Pain Scale: Hurts even more Pain Location: L LE with movement Pain Descriptors / Indicators: Aching;Grimacing;Discomfort;Guarding Pain Intervention(s): Monitored during session;Limited activity within patient's tolerance     Hand Dominance Right   Extremity/Trunk Assessment Upper Extremity Assessment Upper Extremity Assessment: Generalized weakness   Lower Extremity Assessment Lower Extremity Assessment: Defer to PT evaluation   Cervical / Trunk Assessment Cervical / Trunk Assessment: Kyphotic   Communication Communication Communication: No difficulties   Cognition Arousal/Alertness: Awake/alert Behavior During Therapy: WFL for tasks assessed/performed Overall Cognitive Status: History of cognitive impairments - at baseline                                 General Comments: able to follow directions with multimodal cues, lethargic with flat affect, dementia at baseline     General Comments       Exercises     Shoulder Instructions      Home Living Family/patient expects to be discharged to:: Private residence Living Arrangements: Children Available Help at Discharge: Family;Available 24 hours/day Type of Home: House Home Access: Stairs to enter;Level entry Entrance Stairs-Number of Steps: 3 Entrance Stairs-Rails: None Home Layout: One level     Bathroom Shower/Tub: Teacher, early years/pre: Standard Bathroom Accessibility: Yes   Home Equipment: Conservation officer, nature (2 wheels)          Prior Functioning/Environment Prior Level of Function : Patient poor historian/Family not available             Mobility Comments: encouraged to use RW by family but typically did not; has been at [facility] rehab since  initial fx ADLs Comments: Reports [place] with ADLs, some IADLs though unsure of accuracy; has been at [facility] rehab since recent fx/sx        OT Problem List: Decreased strength;Decreased range of motion;Decreased activity tolerance;Impaired balance (sitting and/or standing);Decreased cognition;Decreased safety awareness;Decreased knowledge of use of DME or AE;Decreased knowledge of precautions;Pain      OT Treatment/Interventions: Self-care/ADL training;Therapeutic exercise;Energy conservation;DME and/or AE instruction;Patient/family education    OT Goals(Current goals can be found in the care plan section) Acute Rehab OT Goals Patient Stated Goal: none stated OT Goal Formulation: Patient unable to participate in goal setting Time For Goal Achievement: 05/24/21 Potential to Achieve Goals: Fair ADL Goals Pt Will Perform Lower Body Bathing: with min assist;sitting/lateral leans;sit to/from stand Pt Will Transfer to Toilet: with min assist;ambulating Pt Will Perform Toileting - Clothing Manipulation and hygiene: with min assist;sit to/from stand;sitting/lateral leans  OT Frequency: Min 2X/week   Barriers to D/C:            Co-evaluation              AM-PAC OT "6 Clicks" Daily Activity     Outcome Measure Help from another person eating meals?: A Little Help from another person taking care of personal grooming?: A Little Help from another person toileting, which includes using toliet, bedpan, or urinal?: Total Help from another person bathing (including washing, rinsing, drying)?: A  Lot Help from another person to put on and taking off regular upper body clothing?: A Lot Help from another person to put on and taking off regular lower body clothing?: Total 6 Click Score: 12   End of Session Equipment Utilized During Treatment: Rolling walker (2 wheels) Nurse Communication: Mobility status  Activity Tolerance: Patient limited by lethargy Patient left: in chair;with call  bell/phone within reach;with chair alarm set  OT Visit Diagnosis: Unsteadiness on feet (R26.81);Other abnormalities of gait and mobility (R26.89);Muscle weakness (generalized) (M62.81);History of falling (Z91.81);Repeated falls (R29.6);Pain Pain - Right/Left: Left Pain - part of body: Hip                Time: 2883-3744 OT Time Calculation (min): 23 min Charges:  OT General Charges $OT Visit: 1 Visit OT Evaluation $OT Eval Moderate Complexity: 1 Mod OT Treatments $Self Care/Home Management : 8-22 mins  Malachy Chamber, OTR/L Acute Rehab Services Office: [phone]   Layla Maw 05/10/2021, 8:44 AM

## 2021-05-10 NOTE — Evaluation (Signed)
Physical Therapy Evaluation Patient Details Name: Sara Fox MRN: [id number] DOB: [birth year] Today's Date: 05/10/2021  History of Present Illness  Pt is an 80 y/o F presenting from [place] 05/08/21 with increasing O2 demand on 4L O2. Pt found to have bilateral PE and was started on heparin  PMH includes L hip fracture, s/p IM nail on 12/14.dementia, COPD, CHF, CKD, stage IV lung cancer s/p SBRT.  Clinical Impression  Pt continues to be limited in safe mobility by increased L hip pain with movement and weightbearing. Pt does not see aware of L hip pain in sitting or supine. Pt requires mod-maxA for transfers and bed mobility. PT recommending return to SNF to finish rehab prior to going home. PT will continue to follow acutely.      Recommendations for follow up therapy are one component of a multi-disciplinary discharge planning process, led by the attending physician.  Recommendations may be updated based on patient status, additional functional criteria and insurance authorization.  Follow Up Recommendations Skilled nursing-short term rehab (<3 hours/day)    Assistance Recommended at Discharge Intermittent Supervision/Assistance  Functional Status Assessment Patient has had a recent decline in their functional status and demonstrates the ability to make significant improvements in function in a reasonable and predictable amount of time.  Equipment Recommendations  Wheelchair (measurements PT);Wheelchair cushion (measurements PT);Hospital bed;Other (comment)    Recommendations for Other Services       Precautions / Restrictions Precautions Precautions: Fall;Other (comment) Precaution Comments: incontinent Restrictions Weight Bearing Restrictions: Yes LLE Weight Bearing: Weight bearing as tolerated      Mobility  Bed Mobility Overal bed mobility: Needs Assistance Bed Mobility: Sit to Supine       Sit to supine: Mod assist   General bed mobility comments: pt  able to scoot hips into bed, and lower trunk to bed surface. requries modA for managing LE back to bed    Transfers Overall transfer level: Needs assistance Equipment used: Rolling walker (2 wheels) Transfers: Sit to/from Stand;Bed to chair/wheelchair/BSC Sit to Stand: Mod assist   Step pivot transfers: Max assist       General transfer comment: mod A for power up and steadying from recliner, pt then with poor RW management trying to pick it off the floor and move it unable to move LE, had pt sit back down and then had her stand with face to face assist at hips, pt with max A for weightshift able to step back to bed           Balance Overall balance assessment: Needs assistance Sitting-balance support: Bilateral upper extremity supported;Feet supported Sitting balance-Leahy Scale: Fair Sitting balance - Comments: leans to R side in long sitting, unable to maintian trunk in neutral alignment   Standing balance support: Bilateral upper extremity supported;During functional activity Standing balance-Leahy Scale: Poor Standing balance comment: Able to accept weight onto LLE; REquires Max assist in standing due to heavy posterior lean                             Pertinent Vitals/Pain Pain Assessment: Faces Faces Pain Scale: Hurts even more Breathing: normal Negative Vocalization: none Facial Expression: smiling or inexpressive Body Language: relaxed Consolability: no need to console PAINAD Score: 0 Pain Location: L LE with movement and wB Pain Descriptors / Indicators: Aching;Grimacing;Discomfort;Guarding Pain Intervention(s): Limited activity within patient's tolerance;Monitored during session;Repositioned    Home Living Family/patient expects to be discharged to:: Private  residence Living Arrangements: Children Available Help at Discharge: Family;Available 24 hours/day Type of Home: House Home Access: Stairs to enter;Level entry Entrance Stairs-Rails:  None Entrance Stairs-Number of Steps: 3   Home Layout: One level Home Equipment: Agricultural consultant (2 wheels) Additional Comments: per chart review, pt has RW for mobility at baseline but does not use    Prior Function Prior Level of Function : Patient poor historian/Family not available             Mobility Comments: encouraged to use RW by family but typically did not; has been at [facility] rehab since initial fx report she would like to go outside for a walk ADLs Comments: Reports [place] with ADLs, some IADLs though unsure of accuracy; has been at SNF rehab since recent fx/sx     Hand Dominance   Dominant Hand: Right    Extremity/Trunk Assessment   Upper Extremity Assessment Upper Extremity Assessment: Defer to OT evaluation    Lower Extremity Assessment Lower Extremity Assessment: Difficult to assess due to impaired cognition LLE Deficits / Details: difficult to assess due to pain and cognition, sutures with some erythemia LLE: Unable to fully assess due to pain    Cervical / Trunk Assessment Cervical / Trunk Assessment: Kyphotic  Communication   Communication: No difficulties  Cognition Arousal/Alertness: Awake/alert Behavior During Therapy: WFL for tasks assessed/performed Overall Cognitive Status: History of cognitive impairments - at baseline                                 General Comments: able to follow directions with multimodal cues, lethargic with flat affect, dementia at baseline, decreased memory of hip fx until she moves        General Comments  Pt with increased HR to 130s with painful transfers. SpO2 on RA when pleth wave form is good is in the 90%O2.        Assessment/Plan    PT Assessment Patient needs continued PT services  PT Problem List Decreased strength;Decreased range of motion;Decreased activity tolerance;Decreased balance;Decreased mobility;Decreased cognition;Decreased knowledge of use of DME;Decreased safety  awareness;Pain       PT Treatment Interventions DME instruction;Gait training;Stair training;Functional mobility training;Therapeutic activities;Therapeutic exercise;Balance training;Cognitive remediation;Patient/family education;Manual techniques;Modalities    PT Goals (Current goals can be found in the Care Plan section)  Acute Rehab PT Goals Patient Stated Goal: walk outside PT Goal Formulation: With patient (Able to state that she wants to get up today) Time For Goal Achievement: 05/24/21 Potential to Achieve Goals: Good    Frequency Min 3X/week    AM-PAC PT "6 Clicks" Mobility  Outcome Measure Help needed turning from your back to your side while in a flat bed without using bedrails?: A Lot Help needed moving from lying on your back to sitting on the side of a flat bed without using bedrails?: A Lot Help needed moving to and from a bed to a chair (including a wheelchair)?: A Lot Help needed standing up from a chair using your arms (e.g., wheelchair or bedside chair)?: A Lot Help needed to walk in hospital room?: Total Help needed climbing 3-5 steps with a railing? : Total 6 Click Score: 10    End of Session Equipment Utilized During Treatment: Gait belt Activity Tolerance: Patient tolerated treatment well Patient left: with call bell/phone within reach;in bed;with bed alarm set Nurse Communication: Mobility status;Weight bearing status PT Visit Diagnosis: Muscle weakness (generalized) (M62.81);Difficulty in walking, not  elsewhere classified (R26.2);Pain Pain - Right/Left: Left Pain - part of body: Hip    Time: 1335-1355 PT Time Calculation (min) (ACUTE ONLY): 20 min   Charges:   PT Evaluation $PT Eval Moderate Complexity: 1 Mod          Mccoy Testa B. Beverely Risen PT, DPT Acute Rehabilitation Services Pager [phone] Office [phone]   [place] Alas [facility] 05/10/2021, 3:37 PM

## 2021-05-10 NOTE — Discharge Instructions (Signed)
Information on my medicine - ELIQUIS (apixaban)  This medication education was reviewed with me or my healthcare representative as part of my discharge preparation.   Why was Eliquis prescribed for you? 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  What do You need to know about Eliquis ? The starting dose is 10 mg (two 5 mg tablets) taken TWICE daily for the FIRST SEVEN (7) DAYS, then on 05/17/2021  the dose is reduced to ONE 5 mg tablet taken TWICE daily.  Eliquis may be taken with or without food.   Try to take the dose about the same time in the morning and in the evening. If you have difficulty swallowing the tablet whole please discuss with your pharmacist how to take the medication safely.  Take Eliquis exactly as prescribed and DO NOT stop taking Eliquis without talking to the doctor who prescribed the medication.  Stopping may increase your risk of developing a new blood clot.  Refill your prescription before you run out.  After discharge, you should have regular check-up appointments with your healthcare provider that is prescribing your Eliquis.    What do you do if you miss a dose? If a dose of ELIQUIS is not taken at the scheduled time, take it as soon as possible on the same day and twice-daily administration should be resumed. The dose should not be doubled to make up for a missed dose.  Important Safety Information A possible side effect of Eliquis is bleeding. You should call your healthcare provider right away if you experience any of the following: Bleeding from an injury or your nose that does not stop. Unusual colored urine (red or dark brown) or unusual colored stools (red or black). Unusual bruising for unknown reasons. A serious fall or if you hit your head (even if there is no bleeding).  Some  medicines may interact with Eliquis and might increase your risk of bleeding or clotting while on Eliquis. To help avoid this, consult your healthcare provider or pharmacist prior to using any new prescription or non-prescription medications, including herbals, vitamins, non-steroidal anti-inflammatory drugs (NSAIDs) and supplements.  This website has more information on Eliquis (apixaban): http://[email or web address].[email or web address]/home

## 2021-05-11 LAB — CBC
HCT: 31.6 % — ABNORMAL LOW (ref 36.0–46.0)
Hemoglobin: 9.5 g/dL — ABNORMAL LOW (ref 12.0–15.0)
MCH: 29 pg (ref 26.0–34.0)
MCHC: 30.1 g/dL (ref 30.0–36.0)
MCV: 96.3 fL (ref 80.0–100.0)
Platelets: 544 10*3/uL — ABNORMAL HIGH (ref 150–400)
RBC: 3.28 MIL/uL — ABNORMAL LOW (ref 3.87–5.11)
RDW: 17.3 % — ABNORMAL HIGH (ref 11.5–15.5)
WBC: 6.2 10*3/uL (ref 4.0–10.5)
nRBC: 0 % (ref 0.0–0.2)

## 2021-05-11 LAB — BASIC METABOLIC PANEL
Anion gap: 10 (ref 5–15)
BUN: 31 mg/dL — ABNORMAL HIGH (ref 8–23)
CO2: 20 mmol/L — ABNORMAL LOW (ref 22–32)
Calcium: 9.1 mg/dL (ref 8.9–10.3)
Chloride: 105 mmol/L (ref 98–111)
Creatinine, Ser: 1.22 mg/dL — ABNORMAL HIGH (ref 0.44–1.00)
GFR, Estimated: 45 mL/min — ABNORMAL LOW (ref >60)
Glucose, Bld: 98 mg/dL (ref 70–99)
Potassium: 5.2 mmol/L — ABNORMAL HIGH (ref 3.5–5.1)
Sodium: 135 mmol/L (ref 135–145)

## 2021-05-11 LAB — MAGNESIUM: Magnesium: 2 mg/dL (ref 1.7–2.4)

## 2021-05-11 NOTE — Discharge Summary (Addendum)
Physician Discharge Summary  Sara Fox RKY:[id number] DOB: [birth year] DOA: 05/08/2021  Patient seen examined and agree with POC as per Dr. Francine Graven for d/c back to facility today  PCP: [facility]  Admit date: 05/08/2021 Discharge date: 05/11/2021  Admitted From: SNF Disposition:  SNF  Recommendations for Outpatient Follow-up:  Follow up with PCP in 1-2 weeks Please obtain BMP/CBC in one week your next doctors visit-potassium was stopped as K was high normal on d/c Eliquis BID    Discharge Condition: Stable CODE STATUS: DNR Diet recommendation: Cardiac  Brief/Interim Summary: 80 year old with history of dementia, CAD status post PCI, COPD, CKD stage IIIa, combined CHF, non-small cell lung cancer status postradiation discharge 10 days ago after left hip fracture requiring surgery and pubic rami fractures, left thoracic spine fracture.  Admitted for shortness of breath found to have bilateral pulmonary embolism without strain pattern. Transition Hep drip to Eliquis. LE dopplers is + for DVT b/l, Echo shows ef 60%. PT recommended rehab, therefore transition back.  Family updated during the hospital stay but no answer on the day of discharge.   Assessment & Plan:   Principal Problem:   Pulmonary embolism (HCC) Active Problems:   HTN (hypertension)   Stage 3 chronic kidney disease (HCC)   Hypercholesterolemia   Vitamin B12 deficiency   Non-small cell carcinoma of right lung, stage 1 (HCC)   Late onset Alzheimer's dementia with behavioral disturbance ([place])     Acute bilateral pulmonary embolism, bilateral lower lobe without cor pulmonale - Suspect provoked by recent surgery. - Anticoagulation-Heparin drip transition to Eliquis 10 PO BID and then transition to lower dose after 7 d - Echocardiogram- 60% - Lower extremity Dopplers- b/l LE DVT>   Recent left hip surgery, pubic rami fracture, thoracic spine fracture - Status post ORIF.  At that time patient  was discharged on aspirin 81 mg twice daily for DVT prophylaxis.   Elevated troponin - Downtrending.  Suspect from demand ischemia.  Echocardiogram done.   History of CAD status post PCI - On aspirin, statin, metoprolol  History of CHF, combined systolic and diastolic - Echo showed ef 60% - Lasix 20 mg, metoprolol 25 mg twice daily   Peripheral neuropathy - On gabapentin 100 mg twice daily  CKD stage IIIa -Cr 1.2  Anemia of chronic disease - On iron supplements.  Bowel regimen.  Non-small cell lung cancer status post SBRT - Follow-up outpatient oncology.  Has an apical mass which is stable.  History of COPD - As needed bronchodilators -doubt will need o2 on d/c    Body mass index is 23.65 kg/m.  Pressure Injury 07/18/17 Stage II -  Partial thickness loss of dermis presenting as a shallow open ulcer with a red, pink wound bed without slough. (Active)  07/18/17 0730  Location: Sacrum  Location Orientation:   Staging: Stage II -  Partial thickness loss of dermis presenting as a shallow open ulcer with a red, pink wound bed without slough.  Wound Description (Comments):   Present on Admission: Yes        Discharge Diagnoses:  Principal Problem:   Pulmonary embolism (HCC) Active Problems:   HTN (hypertension)   Stage 3 chronic kidney disease (HCC)   Hypercholesterolemia   Vitamin B12 deficiency   Non-small cell carcinoma of right lung, stage 1 ([place])   Late onset Alzheimer's dementia with behavioral disturbance ([place])      Consultations: None  Subjective:  Awakens easily States she feels cold No distress  Off oxygen she doesn't seem to desat No cp fever   Discharge Exam: Vitals:   05/11/21 0500 05/11/21 0739  BP:  125/62  Pulse:  97  Resp:  19  Temp: 97.7 F (36.5 C) 98.2 F (36.8 C)  SpO2:  (!) 89%   Vitals:   05/10/21 2054 05/11/21 0425 05/11/21 0500 05/11/21 0739  BP: 104/67 (!) 118/56  125/62  Pulse: 76   97  Resp: 20 19  19   Temp:  97.8 F (36.6 C)  97.7 F (36.5 C) 98.2 F (36.8 C)  TempSrc: Oral   Oral  SpO2: 100% 95%  (!) 89%  Weight:   62.5 kg   Height:        General: Pt is alert, awake, not in acute distress comfortable appearing B femeale in nad Cardiovascular: RRR, S1/S2 +, no rubs, no gallops sinus on the monitors Respiratory: CTA bilaterally, no wheezing, no rhonchi Abdominal: Soft, NT, ND, bowel sounds + Extremities: no edema, no cyanosis  Discharge Instructions   Allergies as of 05/11/2021       Reactions   Buprenorphine Hcl Shortness Of Breath   Throat swelling/trouble breathing and lethargic   Morphine And Related Shortness Of Breath   Throat swelling/trouble breathing and lethargic   Celebrex [celecoxib] Diarrhea, Swelling   Swelling of legs    Vioxx [rofecoxib] Palpitations   Codeine Other (See Comments)   Bloated         Medication List     STOP taking these medications    pantoprazole 40 MG tablet Commonly known as: PROTONIX   Potassium Chloride ER 20 MEQ Tbcr       TAKE these medications    acetaminophen 500 MG tablet Commonly known as: TYLENOL Take 2 tablets (1,000 mg total) by mouth every 8 (eight) hours as needed for mild pain or moderate pain.   apixaban 5 MG Tabs tablet Commonly known as: ELIQUIS Take 2 tablets (10 mg total) by mouth 2 (two) times daily for 7 days, THEN 1 tablet (5 mg total) 2 (two) times daily for 23 days. Start taking on: May 17, 2021   aspirin 81 MG EC tablet Take 1 tablet (81 mg total) by mouth daily. Swallow whole. What changed: when to take this   budesonide-formoterol 160-4.5 MCG/ACT inhaler Commonly known as: Symbicort TAKE 2 PUFFS BY MOUTH TWICE A DAY What changed:  how much to take how to take this when to take this additional instructions   busPIRone 5 MG tablet Commonly known as: BUSPAR Take 5 mg by mouth 2 (two) times daily as needed (anxiety).   ciclopirox 8 % solution Commonly known as: Penlac Apply 1  application topically at bedtime. Apply over nail and surrounding skin. Apply daily over previous coat. After seven (7) days, file nail and continue cycle.   donepezil 10 MG tablet Commonly known as: ARICEPT Take 1 tablet (10 mg total) by mouth at bedtime.   ferrous sulfate 325 (65 FE) MG tablet Take 325 mg by mouth daily with breakfast.   furosemide 20 MG tablet Commonly known as: LASIX Take 1 tablet by mouth Mon - Fri daily What changed:  how much to take how to take this when to take this additional instructions   gabapentin 100 MG capsule Commonly known as: NEURONTIN Take 1 capsule (100 mg total) by mouth 2 (two) times daily.   memantine 5 MG tablet Commonly known as: NAMENDA Take 1 tablet (5 mg total) by mouth daily.   methocarbamol 500  MG tablet Commonly known as: ROBAXIN Take 1 tablet (500 mg total) by mouth every 6 (six) hours as needed for muscle spasms.   metoprolol tartrate 25 MG tablet Commonly known as: LOPRESSOR Take 1 tablet (25 mg total) by mouth 2 (two) times daily.   nitroGLYCERIN 0.4 MG SL tablet Commonly known as: NITROSTAT PLACE ONE TABLET UNDER THE TONGUE EVERY 5 MINUTES AS NEEDED FOR CHEST PAIN. What changed:  how much to take how to take this when to take this reasons to take this additional instructions   omeprazole 20 MG capsule Commonly known as: PRILOSEC Take 20 mg by mouth in the morning and at bedtime.   oxybutynin 5 MG tablet Commonly known as: DITROPAN Take 0.5 tablets (2.5 mg total) by mouth 2 (two) times daily.   oxyCODONE 5 MG immediate release tablet Commonly known as: Oxy IR/ROXICODONE Take 1 tablet (5 mg total) by mouth every 4 (four) hours as needed for moderate pain or severe pain.   polyethylene glycol 17 g packet Commonly known as: MIRALAX / GLYCOLAX Take 17 g by mouth daily as needed for mild constipation.   QUEtiapine 50 MG tablet Commonly known as: SEROQUEL TAKE 1 TABLET BY MOUTH EVERYDAY AT BEDTIME What  changed:  how much to take how to take this when to take this additional instructions   rosuvastatin 40 MG tablet Commonly known as: CRESTOR Take 1 tablet (40 mg total) by mouth daily.   traMADol 50 MG tablet Commonly known as: ULTRAM Take 1 tablet (50 mg total) by mouth in the morning, at noon, and at bedtime.        Follow-up Information     [facility] Follow up in 1 week(s).   Contact information: [place] [address] [phone]         Pixie Casino, MD .   Specialty: Cardiology Contact information: [address] [place] [address] [phone]                Allergies  Allergen Reactions   Buprenorphine Hcl Shortness Of Breath    Throat swelling/trouble breathing and lethargic   Morphine And Related Shortness Of Breath    Throat swelling/trouble breathing and lethargic   Celebrex [Celecoxib] Diarrhea and Swelling    Swelling of legs    Vioxx [Rofecoxib] Palpitations   Codeine Other (See Comments)    Bloated     You were cared for by a hospitalist during your hospital stay. If you have any questions about your discharge medications or the care you received while you were in the hospital after you are discharged, you can call the unit and asked to speak with the hospitalist on call if the hospitalist that took care of you is not available. Once you are discharged, your primary care physician will handle any further medical issues. Please note that no refills for any discharge medications will be authorized once you are discharged, as it is imperative that you return to your primary care physician (or establish a relationship with a primary care physician if you do not have one) for your aftercare needs so that they can reassess your need for medications and monitor your lab values.   Procedures/Studies: DG Chest 1 View  Result Date: 04/26/2021 CLINICAL DATA:  [address] [address] out of chair, left hip  fracture, non-small cell right lung cancer EXAM: CHEST  1 VIEW COMPARISON:  02/26/2021, 10/28/2020 FINDINGS: Single frontal view of the chest demonstrates a stable cardiac  silhouette. Stable right apical mass consistent with biopsy-proven non-small cell lung cancer. Chronic areas of scarring are seen elsewhere within the mid upper lung zones. No new airspace disease, effusion, or pneumothorax. No acute bony abnormalities. IMPRESSION: 1. Stable right apical mass consistent with known biopsy-proven non-small cell lung cancer. 2. No acute intrathoracic trauma. Electronically Signed   By: Randa Ngo M.D.   On: 04/26/2021 15:24   DG Pelvis 1-2 Views  Result Date: 05/09/2021 CLINICAL DATA:  History of recent left hip surgery with pelvic pain, initial encounter EXAM: PELVIS - 1 VIEW COMPARISON:  None. FINDINGS: Proximal medullary rod is noted in the left femur with fixation screws traversing the femoral neck. Fracture fragments are in anatomic alignment. Pelvic ring shows superior and inferior pubic rami fractures on the right with some mild callus formation. No new focal abnormality is seen. Postsurgical changes in the lower lumbar spine are noted. IMPRESSION: Changes consistent with recent operative fixation of left femoral fracture. Healing right pubic rami fractures. Electronically Signed   By: Inez Catalina M.D.   On: 05/09/2021 02:38   CT Head Wo Contrast  Result Date: 04/26/2021 CLINICAL DATA:  Neck trauma (Age >= 65y); Head trauma, minor (Age >= 65y) EXAM: CT HEAD WITHOUT CONTRAST CT CERVICAL SPINE WITHOUT CONTRAST TECHNIQUE: Multidetector CT imaging of the head and cervical spine was performed following the standard protocol without intravenous contrast. Multiplanar CT image reconstructions of the cervical spine were also generated. COMPARISON:  CT head February 26, 2021. CT cervical spine November 22, 2020. FINDINGS: CT HEAD FINDINGS Brain: No evidence of acute large vascular infarction, hemorrhage,  hydrocephalus, extra-axial collection or mass lesion/mass effect. Similar patchy white matter hypoattenuation, nonspecific but compatible with chronic microvascular ischemic disease. Vascular: No hyperdense vessel identified. Calcific intracranial atherosclerosis. Skull: No acute fracture. Sinuses/Orbits: Opacified left anterior ethmoid air cell. Otherwise, largely clear sinuses. Unremarkable orbits. Other: No mastoid effusions. CT CERVICAL SPINE FINDINGS Motion limited study.  Within this limitation: Alignment: Similar alignment to the prior. Similar anterolisthesis of C6 on C7 with similar focal kyphosis at this level. Of the lower cervical spine dextrocurvature Skull base and vertebrae: Redemonstrated chronic erosions along the base of the odontoid with posterior pannus. There is also severe erosive change along the right atlantoaxial joint, similar. Cystic degenerative change of the dens, similar. C3-C5 ACDF with corpectomy at C4. No evidence of hardware fracture. Similar alignment. Fused facet joints at C3-C4 and C6-C7. New T2 (20%) and T3 (30%) vertebral body height loss with associated superior endplate sclerosis and lucency through the T3 superior endplate. Findings are partially imaged. Soft tissues and spinal canal: Motion limited evaluation without definite prevertebral edema or visible canal hematoma. Disc levels: Severe multilevel degenerative change. Craniocervical degenerative changes described above. Multilevel bilateral foraminal stenosis. Upper chest: Partially imaged nodular opacities in the posterior right upper lobe, reportedly biopsy-proven non-small cell lung cancer IMPRESSION: CT cervical spine: 1. Acute T2 and T3 compression fractures, described above and partially imaged. Recommend dedicated CT of the thoracic spine to further characterize and to fully evaluate the thoracic spine. 2. Similar postoperative changes of C3-C4 ACDF and C4 corpectomy with similar alignment. 3. Severe multilevel  degenerative change, including severe craniocervical degenerative change and multilevel foraminal stenosis. MRI could further characterize the canal/cord/foramina if clinically indicated. 4. Partially imaged nodular opacities in the posterior right upper lobe, reportedly biopsy-proven non-small cell lung cancer CT head: No evidence of acute intracranial abnormality. Electronically Signed   By: Margaretha Sheffield M.D.   On: 04/26/2021  14:41   CT Angio Chest PE W and/or Wo Contrast  Result Date: 05/09/2021 CLINICAL DATA:  Chest pain and hypoxia, initial encounter EXAM: CT ANGIOGRAPHY CHEST WITH CONTRAST TECHNIQUE: Multidetector CT imaging of the chest was performed using the standard protocol during bolus administration of intravenous contrast. Multiplanar CT image reconstructions and MIPs were obtained to evaluate the vascular anatomy. CONTRAST:  76mL Isovue 370 COMPARISON:  Chest x-ray from earlier in the same day, 04/26/2021. FINDINGS: Cardiovascular: Thoracic aorta demonstrates atherosclerotic calcifications. No aneurysmal dilatation or dissection is noted. Coronary calcifications are seen. No cardiac enlargement is noted. The pulmonary artery is well visualized within normal branching pattern. Filling defects are noted within the left lower lobe pulmonary arterial branch medially as well as within the right lower lobe pulmonary artery consistent with multifocal pulmonary emboli. No right heart strain is noted. Mediastinum/Nodes: Thoracic inlet is within normal limits. No sizable hilar or mediastinal adenopathy is noted. The esophagus as visualized is within normal limits. Lungs/Pleura: Lungs are well aerated bilaterally. Persistent right apical mass lesion is noted stable from the recent exam. No new focal infiltrate or effusion is seen. Mild emphysematous changes are noted. Upper Abdomen: Postsurgical changes are noted at the GE junction. The remainder of the upper abdomen is within normal limits.  Musculoskeletal: Degenerative changes of the thoracic spine are noted. Chronic compression deformities are noted at T3 and T9. Distal sternal fracture is again noted and stable. Old healing rib fractures are noted on the left. No acute rib abnormality is noted. Review of the MIP images confirms the above findings. IMPRESSION: Bilateral pulmonary emboli in the lower lobes without evidence of right heart strain. Stable right apical mass consistent with the known history of lung carcinoma. No acute abnormality is noted. Aortic Atherosclerosis (ICD10-I70.0) and Emphysema (ICD10-J43.9). Critical Value/emergent results were called by telephone at the time of interpretation on 05/09/2021 at 12:57 am to [facility] , who verbally acknowledged these results. Electronically Signed   By: Inez Catalina M.D.   On: 05/09/2021 01:00   CT Cervical Spine Wo Contrast  Result Date: 04/26/2021 CLINICAL DATA:  Neck trauma (Age >= 65y); Head trauma, minor (Age >= 65y) EXAM: CT HEAD WITHOUT CONTRAST CT CERVICAL SPINE WITHOUT CONTRAST TECHNIQUE: Multidetector CT imaging of the head and cervical spine was performed following the standard protocol without intravenous contrast. Multiplanar CT image reconstructions of the cervical spine were also generated. COMPARISON:  CT head February 26, 2021. CT cervical spine November 22, 2020. FINDINGS: CT HEAD FINDINGS Brain: No evidence of acute large vascular infarction, hemorrhage, hydrocephalus, extra-axial collection or mass lesion/mass effect. Similar patchy white matter hypoattenuation, nonspecific but compatible with chronic microvascular ischemic disease. Vascular: No hyperdense vessel identified. Calcific intracranial atherosclerosis. Skull: No acute fracture. Sinuses/Orbits: Opacified left anterior ethmoid air cell. Otherwise, largely clear sinuses. Unremarkable orbits. Other: No mastoid effusions. CT CERVICAL SPINE FINDINGS Motion limited study.  Within this limitation: Alignment:  Similar alignment to the prior. Similar anterolisthesis of C6 on C7 with similar focal kyphosis at this level. Of the lower cervical spine dextrocurvature Skull base and vertebrae: Redemonstrated chronic erosions along the base of the odontoid with posterior pannus. There is also severe erosive change along the right atlantoaxial joint, similar. Cystic degenerative change of the dens, similar. C3-C5 ACDF with corpectomy at C4. No evidence of hardware fracture. Similar alignment. Fused facet joints at C3-C4 and C6-C7. New T2 (20%) and T3 (30%) vertebral body height loss with associated superior endplate sclerosis and lucency through the T3  superior endplate. Findings are partially imaged. Soft tissues and spinal canal: Motion limited evaluation without definite prevertebral edema or visible canal hematoma. Disc levels: Severe multilevel degenerative change. Craniocervical degenerative changes described above. Multilevel bilateral foraminal stenosis. Upper chest: Partially imaged nodular opacities in the posterior right upper lobe, reportedly biopsy-proven non-small cell lung cancer IMPRESSION: CT cervical spine: 1. Acute T2 and T3 compression fractures, described above and partially imaged. Recommend dedicated CT of the thoracic spine to further characterize and to fully evaluate the thoracic spine. 2. Similar postoperative changes of C3-C4 ACDF and C4 corpectomy with similar alignment. 3. Severe multilevel degenerative change, including severe craniocervical degenerative change and multilevel foraminal stenosis. MRI could further characterize the canal/cord/foramina if clinically indicated. 4. Partially imaged nodular opacities in the posterior right upper lobe, reportedly biopsy-proven non-small cell lung cancer CT head: No evidence of acute intracranial abnormality. Electronically Signed   By: Margaretha Sheffield M.D.   On: 04/26/2021 14:41   CT CHEST ABDOMEN PELVIS W CONTRAST  Result Date: 04/26/2021 CLINICAL  DATA:  Dementia patient found on the floor after a fall. EXAM: CT CHEST, ABDOMEN, AND PELVIS WITH CONTRAST TECHNIQUE: Multidetector CT imaging of the chest, abdomen and pelvis was performed following the standard protocol during bolus administration of intravenous contrast. CONTRAST:  73mL OMNIPAQUE IOHEXOL 300 MG/ML  SOLN COMPARISON:  10/28/2020 FINDINGS: CT CHEST FINDINGS Cardiovascular: Art size upper limits of normal. No pericardial fluid. Coronary artery calcification and aortic atherosclerotic calcification as seen previously. Mediastinum/Nodes: No mediastinal or hilar mass or lymphadenopathy. Lungs/Pleura: Indistinct mass lesion at the right apex, apparently malignant by biopsy, appears slightly larger. This is difficult to measure precisely because of the morphology. Elsewhere, 6 mm subsolid nodule previously seen in the superior segment of the left lower lobe is unchanged, axial image 37. No progressive feature. No pleural effusion seen on either side. No evidence of infectious pneumonia or pulmonary collapse. Musculoskeletal: No rib fracture on the right. On the left, there are numerous subacute healing rib fractures. There are subacute but somewhat more recent appearing rib fractures posteriorly affecting the left seventh, eighth and ninth ribs. Old appearing compression deformity at T9. Minor superior endplate fracture at T3, not present in June of this year. Sternal fracture which appears subacute but not united, not present in June of this year. CT ABDOMEN PELVIS FINDINGS Hepatobiliary: No liver parenchymal abnormality. Previous cholecystectomy. Chronic biliary ductal prominence. Pancreas: Negative Spleen: Normal Adrenals/Urinary Tract: Adrenal glands are normal. Multiple renal cysts on the left. Small nonobstructing renal calculi. Bladder is normal. Stomach/Bowel: Previous gastric surgery. Left inguinal hernia containing several loops of small intestine. No evidence of obstruction or incarceration. No  acute colon finding. Vascular/Lymphatic: Aortic atherosclerosis. No aneurysm. IVC is normal. No retroperitoneal adenopathy. Reproductive: No pelvic mass. Other: No free fluid or air. Musculoskeletal: Distant discectomy and fusion procedure L4 to the sacrum. Likely nonunion at L5-S1. subacute fracture of the pubic rami on the right, not yet healed. Acute intertrochanteric fracture of the left femur. IMPRESSION: Slight enlargement of an indistinct irregular mass lesion at the right apex, reported to be malignant by prior biopsy. No change in a 6 mm subsolid nodule in the superior segment of the left lower lobe. Multiple subacute rib fractures on the left of varying ages, not yet healed. No sign of acute fracture today. Old compression deformity at T9. More recent superior endplate fracture at T3, not present in June of this year, but probably subacute. Subacute sternal fracture, not yet united. No evidence of acute  abdominal organ injury. Aortic atherosclerosis. Left inguinal hernia containing small bowel but no evidence of obstruction or incarceration. Subacute pubic rami fractures on the right. Acute intertrochanteric femoral fracture on the left. 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 Electronically Signed   By: Nelson Chimes M.D.   On: 04/26/2021 16:36   CT T-SPINE NO CHARGE  Result Date: 04/26/2021 CLINICAL DATA:  Fall with back pain EXAM: CT THORACIC SPINE WITHOUT CONTRAST TECHNIQUE: Multidetector CT images of the thoracic were obtained using the standard protocol without intravenous contrast. COMPARISON:  Cervical CT 11/22/2020.  CT chest 10/28/2020. FINDINGS: Alignment: Increased kyphotic curvature. Vertebrae: Old compression fracture at T9, not progressed since June. Newly seen superior endplate fractures at T2, T3 and T4, likely subacute. Loss of height 20% or less at those levels. No acute thoracic region fracture identified. Paraspinal and other soft tissues: Negative Disc levels: No significant  thoracic degenerative disease. No bony stenosis of the canal or foramina. IMPRESSION: No acute thoracic spine fracture suspected. Old fracture of T9. Likely subacute fractures at T2, T3 and T4, with only minimal loss of height. Electronically Signed   By: Nelson Chimes M.D.   On: 04/26/2021 16:38   CT L-SPINE NO CHARGE  Result Date: 04/26/2021 CLINICAL DATA:  [place] [address].  Assess for lumbar compression fracture. EXAM: CT LUMBAR SPINE WITHOUT CONTRAST TECHNIQUE: Multidetector CT imaging of the lumbar spine was performed without intravenous contrast administration. Multiplanar CT image reconstructions were also generated. COMPARISON:  MRI 02/27/2021 FINDINGS: Segmentation: 5 lumbar type vertebral bodies. Alignment: Fixed anterolisthesis at L4-5 of 8 mm. Anterolisthesis at L5-S1 of 7 mm. Vertebrae: No lumbar region fracture. Likely solid union at the L4-5 level. Nonunion at L5-S1, with pronounced lucency surrounding the sacral screws and nitrogen gas in the L5-S1 disc level. Paraspinal and other soft tissues: Negative Disc levels: L1-2 and L2-3 are negative. L3-4 shows mild bulging of the disc and facet hypertrophy. See above regarding the surgical region from L4 to the sacrum. IMPRESSION: No acute traumatic finding in the lumbar region. Previous discectomy and fusion procedure from L4 to the sacrum. 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 Electronically Signed   By: Nelson Chimes M.D.   On: 04/26/2021 16:41   DG Chest Port 1 View  Result Date: 05/08/2021 CLINICAL DATA:  Dyspnea, chest pain, shortness of breath, history CHF, COPD, coronary artery disease post MI, hypertension, smoker EXAM: PORTABLE CHEST 1 VIEW COMPARISON:  Portable exam 2021 hours compared to 04/26/2021 FINDINGS: Upper normal heart size. Mediastinal contours and pulmonary vascularity normal. Persistent RIGHT apical mass, by prior report a biopsy-proven non-small cell lung  cancer. Skin folds project over the chest bilaterally. Chronic accentuation of RIGHT upper lobe markings. No acute infiltrate, pleural effusion or pneumothorax. RIGHT shoulder prosthesis and advanced LEFT glenohumeral degenerative changes noted. IMPRESSION: Known RIGHT apex lung cancer. Chronic lung changes without acute abnormalities. Electronically Signed   By: Lavonia Dana M.D.   On: 05/08/2021 20:52   DG Knee Left Port  Result Date: 04/26/2021 CLINICAL DATA:  Un witnessed fall, left knee pain EXAM: PORTABLE LEFT KNEE - 1-2 VIEW COMPARISON:  02/06/2006 FINDINGS: Frontal and lateral views of the left knee demonstrate 3 component left knee arthroplasty in the expected position without signs of acute complication. There are no acute displaced fractures. Small joint effusion. Diffuse soft tissue edema. IMPRESSION: 1. No acute displaced fracture. 2. Diffuse soft tissue edema and trace left  knee effusion. 3. Unremarkable 3 component left knee arthroplasty. Electronically Signed   By: Randa Ngo M.D.   On: 04/26/2021 17:05   DG C-Arm 1-60 Min-No Report  Result Date: 04/27/2021 CLINICAL DATA:  Surgical internal fixation of left proximal femur fracture. EXAM: OPERATIVE left HIP (WITH PELVIS IF PERFORMED) 5 VIEWS TECHNIQUE: Fluoroscopic spot image(s) were submitted for interpretation post-operatively. Radiation exposure index: 15.52 mGy. COMPARISON:  April 26, 2021. FINDINGS: Five intraoperative fluoroscopic images were obtained of the left hip. These images demonstrate surgical internal fixation of proximal left femoral intertrochanteric fracture. IMPRESSION: Fluoroscopic guidance provided during surgical internal fixation of proximal left femoral intertrochanteric fracture. Electronically Signed   By: Marijo Conception M.D.   On: 04/27/2021 16:01   ECHOCARDIOGRAM COMPLETE  Result Date: 05/09/2021    ECHOCARDIOGRAM REPORT   Patient Name:   Sara Fox Date of Exam: 05/09/2021 Medical Rec #:   [id number]      Height:       64.0 in Accession #:    [id number]     Weight:       140.7 lb Date of Birth:  Dec 11, 1940      BSA:          1.685 m Patient Age:    80 years       BP:           107/56 mmHg Patient Gender: F              HR:           65 bpm. Exam Location:  Inpatient Procedure: 2D Echo, Cardiac Doppler and Color Doppler Indications:    Pulmonary Embolus  History:        Patient has prior history of Echocardiogram examinations, most                 recent 11/23/2020. CHF, Previous Myocardial Infarction and CAD,                 Signs/Symptoms:Altered Mental Status; Risk Factors:Hypertension,                 Dyslipidemia and Former Smoker.  Sonographer:    Wenda Low Referring Phys: [place]  Sonographer Comments: Image acquisition challenging due to uncooperative patient. IMPRESSIONS  1. Left ventricular ejection fraction, by estimation, is 60 to 65%. The left ventricle has normal function. The left ventricle has no regional wall motion abnormalities. Left ventricular diastolic parameters are consistent with Grade I diastolic dysfunction (impaired relaxation).  2. Right ventricular systolic function is normal. The right ventricular size is normal.  3. Left atrial size was mildly dilated.  4. Right atrial size was mildly dilated.  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 Trivial mitral valve regurgitation.  6. The aortic valve is tricuspid. There is mild calcification of the aortic valve. There is mild thickening of the aortic valve. Aortic valve regurgitation is not visualized. Aortic valve sclerosis/calcification is present, without any evidence of aortic stenosis. Comparison(s): No significant change from prior study. Prior images reviewed side by side. FINDINGS  Left Ventricle: Left ventricular ejection fraction, by estimation, is 60 to 65%. The left ventricle has normal function. The left ventricle has no regional wall motion abnormalities. The left ventricular internal cavity size was  normal in size. There is  no left ventricular hypertrophy. Left ventricular diastolic parameters are consistent with Grade I diastolic dysfunction (impaired relaxation). Right Ventricle: The right ventricular size is normal. No increase in right ventricular wall  thickness. Right ventricular systolic function is normal. Left Atrium: Left atrial size was mildly dilated. Right Atrium: Right atrial size was mildly dilated. Pericardium: There is no evidence of pericardial effusion. Mitral Valve: The mitral valve is myxomatous. Mild mitral annular calcification. Trivial mitral valve regurgitation. MV peak gradient, 6.8 mmHg. The mean mitral valve gradient is 2.0 mmHg. Tricuspid Valve: The tricuspid valve is normal in structure. Tricuspid valve regurgitation is not demonstrated. Aortic Valve: The aortic valve is tricuspid. There is mild calcification of the aortic valve. There is mild thickening of the aortic valve. Aortic valve regurgitation is not visualized. Aortic valve sclerosis/calcification is present, without any evidence of aortic stenosis. Aortic valve mean gradient measures 3.0 mmHg. Aortic valve peak gradient measures 6.6 mmHg. Aortic valve area, by VTI measures 2.12 cm. Pulmonic Valve: The pulmonic valve was not well visualized. Pulmonic valve regurgitation is not visualized. Aorta: The aortic root is normal in size and structure. IAS/Shunts: No atrial level shunt detected by color flow Doppler.  LEFT VENTRICLE PLAX 2D LVIDd:         4.10 cm   Diastology LVIDs:         2.70 cm   LV e' medial:    6.96 cm/s LV PW:         0.70 cm   LV E/e' medial:  11.2 LV IVS:        0.90 cm   LV e' lateral:   5.44 cm/s LVOT diam:     1.90 cm   LV E/e' lateral: 14.4 LV SV:         61 LV SV Index:   36 LVOT Area:     2.84 cm  RIGHT VENTRICLE RV Basal diam:  3.25 cm RV Mid diam:    3.10 cm RV S prime:     9.68 cm/s TAPSE (M-mode): 2.4 cm LEFT ATRIUM             Index        RIGHT ATRIUM           Index LA diam:        3.20 cm  1.90 cm/m   RA Area:     20.50 cm LA Vol (A2C):   49.6 ml 29.44 ml/m  RA Volume:   65.10 ml  38.65 ml/m LA Vol (A4C):   44.1 ml 26.18 ml/m LA Biplane Vol: 47.4 ml 28.14 ml/m  AORTIC VALVE                    PULMONIC VALVE AV Area (Vmax):    2.44 cm     PV Vmax:       0.73 m/s AV Area (Vmean):   2.22 cm     PV Peak grad:  2.2 mmHg AV Area (VTI):     2.12 cm AV Vmax:           128.00 cm/s AV Vmean:          84.800 cm/s AV VTI:            0.286 m AV Peak Grad:      6.6 mmHg AV Mean Grad:      3.0 mmHg LVOT Vmax:         110.00 cm/s LVOT Vmean:        66.300 cm/s LVOT VTI:          0.214 m LVOT/AV VTI ratio: 0.75  AORTA Ao Root diam: 3.10 cm MITRAL VALVE MV Area (PHT): 2.55  cm     SHUNTS MV Area VTI:   1.33 cm     Systemic VTI:  0.21 m MV Peak grad:  6.8 mmHg     Systemic Diam: 1.90 cm MV Mean grad:  2.0 mmHg MV Vmax:       1.30 m/s MV Vmean:      70.9 cm/s MV Decel Time: 298 msec MV E velocity: 78.10 cm/s MV A velocity: 118.00 cm/s MV E/A ratio:  0.66 Mihai Croitoru MD Electronically signed by Sanda Klein MD Signature Date/Time: 05/09/2021/1:16:06 PM    Final    DG HIP PORT UNILAT W OR W/O PELVIS 1V LEFT  Result Date: 04/27/2021 CLINICAL DATA:  Hip fracture EXAM: DG HIP (WITH OR WITHOUT PELVIS) 1V PORT LEFT COMPARISON:  04/26/2021 FINDINGS: Interval intramedullary rod and distal screw fixation of the left femur for intertrochanteric fracture. There is anatomic alignment. Gas in the soft tissues consistent with recent surgery. IMPRESSION: Interval surgical fixation of left intertrochanteric fracture with expected postsurgical change Electronically Signed   By: Donavan Foil M.D.   On: 04/27/2021 18:39   DG HIP OPERATIVE UNILAT WITH PELVIS LEFT  Result Date: 04/27/2021 CLINICAL DATA:  Surgical internal fixation of left proximal femur fracture. EXAM: OPERATIVE left HIP (WITH PELVIS IF PERFORMED) 5 VIEWS TECHNIQUE: Fluoroscopic spot image(s) were submitted for interpretation post-operatively.  Radiation exposure index: 15.52 mGy. COMPARISON:  April 26, 2021. FINDINGS: Five intraoperative fluoroscopic images were obtained of the left hip. These images demonstrate surgical internal fixation of proximal left femoral intertrochanteric fracture. IMPRESSION: Fluoroscopic guidance provided during surgical internal fixation of proximal left femoral intertrochanteric fracture. Electronically Signed   By: Marijo Conception M.D.   On: 04/27/2021 16:01   DG Hip Unilat With Pelvis 2-3 Views Left  Result Date: 04/26/2021 CLINICAL DATA:  [place], left hip deformity EXAM: DG HIP (WITH OR WITHOUT PELVIS) 2-3V LEFT COMPARISON:  02/27/2021 FINDINGS: Frontal view of the pelvis as well as frontal and cross-table lateral views of the left hip are obtained. There is a displaced intertrochanteric left hip fracture, with mild impaction and varus angulation. No dislocation. There are fractures through the right superior and inferior pubic rami which are new since prior study. There is some sclerosis along the margins of these fractures and possible periosteal reaction, which could suggest subacute injury. Fracture lines are still readily apparent [facility]. The bones are diffusely osteopenic. Sacroiliac joints are unremarkable. Stable postsurgical changes at the lumbosacral junction. IMPRESSION: 1. Acute intertrochanteric left hip fracture, with mild impaction and varus angulation. 2. Acute to subacute fractures through the right superior and inferior pubic rami. 3. Diffuse osteopenia. Electronically Signed   By: Randa Ngo M.D.   On: 04/26/2021 15:21   VAS [place] LOWER EXTREMITY VENOUS (DVT)  Result Date: 05/09/2021  Lower Venous DVT Study Patient Name:  Sara Fox  Date of Exam:   05/09/2021 Medical Rec #: [id number]       Accession #:    [id number] Date of Birth: Apr 01, 1941       Patient Gender: F Patient Age:   72 years Exam Location:  [facility] Procedure:      VAS [place] LOWER EXTREMITY VENOUS (DVT) Referring  Phys: Gean Birchwood --------------------------------------------------------------------------------  Indications: Pulmonary embolism, and SOB.  Risk Factors: Left hip fracture 10 days ago. Limitations: Confusion, positioning. Comparison Study: Prior negative bilateral LEV done 07/11/2017 Performing Technologist: Sharion Dove RVS  Examination Guidelines: A complete evaluation includes B-mode imaging, spectral Doppler, color Doppler, and power  Doppler as needed of all accessible portions of each vessel. Bilateral testing is considered an integral part of a complete examination. Limited examinations for reoccurring indications may be performed as noted. The reflux portion of the exam is performed with the patient in reverse Trendelenburg.  +---------+---------------+---------+-----------+----------+-------------------+  RIGHT     Compressibility Phasicity Spontaneity Properties Thrombus Aging       +---------+---------------+---------+-----------+----------+-------------------+  CFV       Full            Yes       Yes                                         +---------+---------------+---------+-----------+----------+-------------------+  SFJ       Full                                                                  +---------+---------------+---------+-----------+----------+-------------------+  FV Prox   Full                                                                  +---------+---------------+---------+-----------+----------+-------------------+  FV Mid    Full                                                                  +---------+---------------+---------+-----------+----------+-------------------+  FV Distal Full                                                                  +---------+---------------+---------+-----------+----------+-------------------+  PFV       Full                                                                   +---------+---------------+---------+-----------+----------+-------------------+  POP                       Yes       Yes                    patent by color and  Doppler              +---------+---------------+---------+-----------+----------+-------------------+  PTV       Full                                                                  +---------+---------------+---------+-----------+----------+-------------------+  PERO      None                                             Age Indeterminate    +---------+---------------+---------+-----------+----------+-------------------+   +---------+---------------+---------+-----------+----------+-----------------+  LEFT      Compressibility Phasicity Spontaneity Properties Thrombus Aging     +---------+---------------+---------+-----------+----------+-----------------+  CFV       Full            Yes       Yes                                       +---------+---------------+---------+-----------+----------+-----------------+  SFJ       Full                                                                +---------+---------------+---------+-----------+----------+-----------------+  FV Prox   Full                                                                +---------+---------------+---------+-----------+----------+-----------------+  FV Mid    Full                                                                +---------+---------------+---------+-----------+----------+-----------------+  FV Distal Full                                                                +---------+---------------+---------+-----------+----------+-----------------+  PFV       Full                                                                +---------+---------------+---------+-----------+----------+-----------------+  POP       Full  Yes       Yes                                        +---------+---------------+---------+-----------+----------+-----------------+  PTV       Full                                                                +---------+---------------+---------+-----------+----------+-----------------+  PERO      None                                             Age Indeterminate  +---------+---------------+---------+-----------+----------+-----------------+     Summary: BILATERAL: -No evidence of popliteal cyst, bilaterally. RIGHT: - Findings consistent with age indeterminate deep vein thrombosis involving the right peroneal veins.  LEFT: - Findings consistent with age indeterminate deep vein thrombosis involving the left peroneal veins.  *See table(s) above for measurements and observations. Electronically signed by Jamelle Haring on 05/09/2021 at 1:06:48 PM.    Final      The results of significant diagnostics from this hospitalization (including imaging, microbiology, ancillary and laboratory) are listed below for reference.     Microbiology: Recent Results (from the past 240 hour(s))  Resp Panel by RT-PCR (Flu A&B, Covid) Nasopharyngeal Swab     Status: None   Collection Time: 05/02/21 11:48 AM   Specimen: Nasopharyngeal Swab; Nasopharyngeal(NP) swabs in vial transport medium  Result Value Ref Range Status   SARS Coronavirus 2 by RT PCR NEGATIVE NEGATIVE Final    Comment: (NOTE) SARS-CoV-2 target nucleic acids are NOT DETECTED.  The SARS-CoV-2 RNA is generally detectable in upper respiratory specimens during the acute phase of infection. The lowest concentration of SARS-CoV-2 viral copies this assay can detect is 138 copies/mL. A negative result does not preclude SARS-Cov-2 infection and should not be used as the sole basis for treatment or other patient management decisions. A negative result may occur with  improper specimen collection/handling, submission of specimen other than nasopharyngeal swab, presence of viral mutation(s) within the areas  targeted by this assay, and inadequate number of viral copies(<138 copies/mL). A negative result must be combined with clinical observations, patient history, and epidemiological information. The expected result is Negative.  Fact Sheet for Patients:  EntrepreneurPulse.com.au  Fact Sheet for Healthcare Providers:  IncredibleEmployment.be  This test is no t yet approved or cleared by the [place] FDA and  has been authorized for detection and/or diagnosis of SARS-CoV-2 by FDA under an Emergency Use Authorization (EUA). This EUA will remain  in effect (meaning this test can be used) for the duration of the COVID-19 declaration under Section 564(b)(1) of the Act, 21 U.S.C.section 360bbb-3(b)(1), unless the authorization is terminated  or revoked sooner.       Influenza A by PCR NEGATIVE NEGATIVE Final   Influenza B by PCR NEGATIVE NEGATIVE Final    Comment: (NOTE) The Xpert Xpress SARS-CoV-2/FLU/RSV plus assay is intended as an aid in the diagnosis of influenza from Nasopharyngeal swab specimens and should not be used as a sole basis for treatment. Nasal washings and aspirates  are unacceptable for Xpert Xpress SARS-CoV-2/FLU/RSV testing.  Fact Sheet for Patients: EntrepreneurPulse.com.au  Fact Sheet for Healthcare Providers: IncredibleEmployment.be  This test is not yet approved or cleared by the [place] FDA and has been authorized for detection and/or diagnosis of SARS-CoV-2 by FDA under an Emergency Use Authorization (EUA). This EUA will remain in effect (meaning this test can be used) for the duration of the COVID-19 declaration under Section 564(b)(1) of the Act, 21 U.S.C. section 360bbb-3(b)(1), unless the authorization is terminated or revoked.  Performed at [facility], [place] [address], [place], [place] [address]   Resp Panel by RT-PCR (Flu A&B, Covid) Nasopharyngeal Swab      Status: None   Collection Time: 05/08/21  8:04 PM   Specimen: Nasopharyngeal Swab; Nasopharyngeal(NP) swabs in vial transport medium  Result Value Ref Range Status   SARS Coronavirus 2 by RT PCR NEGATIVE NEGATIVE Final    Comment: (NOTE) SARS-CoV-2 target nucleic acids are NOT DETECTED.  The SARS-CoV-2 RNA is generally detectable in upper respiratory specimens during the acute phase of infection. The lowest concentration of SARS-CoV-2 viral copies this assay can detect is 138 copies/mL. A negative result does not preclude SARS-Cov-2 infection and should not be used as the sole basis for treatment or other patient management decisions. A negative result may occur with  improper specimen collection/handling, submission of specimen other than nasopharyngeal swab, presence of viral mutation(s) within the areas targeted by this assay, and inadequate number of viral copies(<138 copies/mL). A negative result must be combined with clinical observations, patient history, and epidemiological information. The expected result is Negative.  Fact Sheet for Patients:  EntrepreneurPulse.com.au  Fact Sheet for Healthcare Providers:  IncredibleEmployment.be  This test is no t yet approved or cleared by the [place] FDA and  has been authorized for detection and/or diagnosis of SARS-CoV-2 by FDA under an Emergency Use Authorization (EUA). This EUA will remain  in effect (meaning this test can be used) for the duration of the COVID-19 declaration under Section 564(b)(1) of the Act, 21 U.S.C.section 360bbb-3(b)(1), unless the authorization is terminated  or revoked sooner.       Influenza A by PCR NEGATIVE NEGATIVE Final   Influenza B by PCR NEGATIVE NEGATIVE Final    Comment: (NOTE) The Xpert Xpress SARS-CoV-2/FLU/RSV plus assay is intended as an aid in the diagnosis of influenza from Nasopharyngeal swab specimens and should not be used as a sole basis  for treatment. Nasal washings and aspirates are unacceptable for Xpert Xpress SARS-CoV-2/FLU/RSV testing.  Fact Sheet for Patients: EntrepreneurPulse.com.au  Fact Sheet for Healthcare Providers: IncredibleEmployment.be  This test is not yet approved or cleared by the [place] FDA and has been authorized for detection and/or diagnosis of SARS-CoV-2 by FDA under an Emergency Use Authorization (EUA). This EUA will remain in effect (meaning this test can be used) for the duration of the COVID-19 declaration under Section 564(b)(1) of the Act, 21 U.S.C. section 360bbb-3(b)(1), unless the authorization is terminated or revoked.  Performed at [facility], [place] [address], [place], [place] [address]   MRSA Next Gen by PCR, Nasal     Status: None   Collection Time: 05/09/21  6:12 PM   Specimen: Nasal Mucosa; Nasal Swab  Result Value Ref Range Status   MRSA by PCR Next Gen NOT DETECTED NOT DETECTED Final    Comment: (NOTE) The GeneXpert MRSA Assay (FDA approved for NASAL specimens only), is one component of a comprehensive MRSA colonization surveillance  program. It is not intended to diagnose MRSA infection nor to guide or monitor treatment for MRSA infections. Test performance is not FDA approved in patients less than 69 years old. Performed at [facility], [place] [name] [place], [place] [address]   Resp Panel by RT-PCR (Flu A&B, Covid) Nasopharyngeal Swab     Status: None   Collection Time: 05/10/21 12:29 PM   Specimen: Nasopharyngeal Swab; Nasopharyngeal(NP) swabs in vial transport medium  Result Value Ref Range Status   SARS Coronavirus 2 by RT PCR NEGATIVE NEGATIVE Final    Comment: (NOTE) SARS-CoV-2 target nucleic acids are NOT DETECTED.  The SARS-CoV-2 RNA is generally detectable in upper respiratory specimens during the acute phase of infection. The lowest concentration of SARS-CoV-2 viral copies this assay can detect  is 138 copies/mL. A negative result does not preclude SARS-Cov-2 infection and should not be used as the sole basis for treatment or other patient management decisions. A negative result may occur with  improper specimen collection/handling, submission of specimen other than nasopharyngeal swab, presence of viral mutation(s) within the areas targeted by this assay, and inadequate number of viral copies(<138 copies/mL). A negative result must be combined with clinical observations, patient history, and epidemiological information. The expected result is Negative.  Fact Sheet for Patients:  EntrepreneurPulse.com.au  Fact Sheet for Healthcare Providers:  IncredibleEmployment.be  This test is no t yet approved or cleared by the [place] FDA and  has been authorized for detection and/or diagnosis of SARS-CoV-2 by FDA under an Emergency Use Authorization (EUA). This EUA will remain  in effect (meaning this test can be used) for the duration of the COVID-19 declaration under Section 564(b)(1) of the Act, 21 U.S.C.section 360bbb-3(b)(1), unless the authorization is terminated  or revoked sooner.       Influenza A by PCR NEGATIVE NEGATIVE Final   Influenza B by PCR NEGATIVE NEGATIVE Final    Comment: (NOTE) The Xpert Xpress SARS-CoV-2/FLU/RSV plus assay is intended as an aid in the diagnosis of influenza from Nasopharyngeal swab specimens and should not be used as a sole basis for treatment. Nasal washings and aspirates are unacceptable for Xpert Xpress SARS-CoV-2/FLU/RSV testing.  Fact Sheet for Patients: EntrepreneurPulse.com.au  Fact Sheet for Healthcare Providers: IncredibleEmployment.be  This test is not yet approved or cleared by the [place] FDA and has been authorized for detection and/or diagnosis of SARS-CoV-2 by FDA under an Emergency Use Authorization (EUA). This EUA will remain in effect  (meaning this test can be used) for the duration of the COVID-19 declaration under Section 564(b)(1) of the Act, 21 U.S.C. section 360bbb-3(b)(1), unless the authorization is terminated or revoked.  Performed at [facility], [place] [address], [place], [place] [address]      Labs: BNP (last 3 results) Recent Labs    11/22/20 1354 02/27/21 0504 05/08/21 2004  BNP 51.3 63.7 09.8    Basic Metabolic Panel: Recent Labs  Lab 05/08/21 2004 05/08/21 2012 05/09/21 0731 05/10/21 0401 05/11/21 0248  NA 136 140 135 136 135  K 4.2 4.2 4.8 4.8 5.2*  CL 112*  --  109 110 105  CO2 18*  --  20* 18* 20*  GLUCOSE 102*  --  96 91 98  BUN 31*  --  26* 26* 31*  CREATININE 1.27*  --  1.06* 1.14* 1.22*  CALCIUM 8.7*  --  8.8* 8.9 9.1  MG  --   --   --  2.1 2.0  Liver Function Tests: Recent Labs  Lab 05/08/21 2004 05/09/21 0731  AST 22 21  ALT 18 20  ALKPHOS 205* 190*  BILITOT 0.5 0.5  PROT 6.3* 6.1*  ALBUMIN 2.6* 2.6*    No results for input(s): LIPASE, AMYLASE in the last 168 hours. No results for input(s): AMMONIA in the last 168 hours. CBC: Recent Labs  Lab 05/08/21 2004 05/08/21 2012 05/09/21 0731 05/10/21 0401 05/11/21 0248  WBC 5.2  --  6.1 5.0 6.2  NEUTROABS 3.7  --   --   --   --   HGB 9.0* 10.5* 9.6* 8.4* 9.5*  HCT 31.0* 31.0* 33.0* 27.9* 31.6*  MCV 101.3*  --  100.3* 96.5 96.3  PLT 467*  --  540* 535* 544*    Cardiac Enzymes: No results for input(s): CKTOTAL, CKMB, CKMBINDEX, TROPONINI in the last 168 hours. BNP: Invalid input(s): POCBNP CBG: No results for input(s): GLUCAP in the last 168 hours. D-Dimer No results for input(s): DDIMER in the last 72 hours. Hgb A1c No results for input(s): HGBA1C in the last 72 hours. Lipid Profile No results for input(s): CHOL, HDL, LDLCALC, TRIG, CHOLHDL, LDLDIRECT in the last 72 hours. Thyroid function studies No results for input(s): TSH, T4TOTAL, T3FREE, THYROIDAB in the last 72 hours.  Invalid  input(s): FREET3 Anemia work up No results for input(s): VITAMINB12, FOLATE, FERRITIN, TIBC, IRON, RETICCTPCT in the last 72 hours. Urinalysis    Component Value Date/Time   COLORURINE YELLOW 05/08/2021 2010   APPEARANCEUR CLEAR 05/08/2021 2010   LABSPEC 1.015 05/08/2021 2010   PHURINE 6.5 05/08/2021 2010   GLUCOSEU NEGATIVE 05/08/2021 2010   HGBUR NEGATIVE 05/08/2021 2010   [place] 05/08/2021 2010   BILIRUBINUR Negative 12/15/2020 [place] 05/08/2021 2010   PROTEINUR NEGATIVE 05/08/2021 2010   UROBILINOGEN 0.2 12/15/2020 1551   UROBILINOGEN 0.2 09/12/2014 2302   NITRITE NEGATIVE 05/08/2021 2010   LEUKOCYTESUR NEGATIVE 05/08/2021 2010   Sepsis Labs Invalid input(s): PROCALCITONIN,  WBC,  LACTICIDVEN Microbiology Recent Results (from the past 240 hour(s))  Resp Panel by RT-PCR (Flu A&B, Covid) Nasopharyngeal Swab     Status: None   Collection Time: 05/02/21 11:48 AM   Specimen: Nasopharyngeal Swab; Nasopharyngeal(NP) swabs in vial transport medium  Result Value Ref Range Status   SARS Coronavirus 2 by RT PCR NEGATIVE NEGATIVE Final    Comment: (NOTE) SARS-CoV-2 target nucleic acids are NOT DETECTED.  The SARS-CoV-2 RNA is generally detectable in upper respiratory specimens during the acute phase of infection. The lowest concentration of SARS-CoV-2 viral copies this assay can detect is 138 copies/mL. A negative result does not preclude SARS-Cov-2 infection and should not be used as the sole basis for treatment or other patient management decisions. A negative result may occur with  improper specimen collection/handling, submission of specimen other than nasopharyngeal swab, presence of viral mutation(s) within the areas targeted by this assay, and inadequate number of viral copies(<138 copies/mL). A negative result must be combined with clinical observations, patient history, and epidemiological information. The expected result is  Negative.  Fact Sheet for Patients:  EntrepreneurPulse.com.au  Fact Sheet for Healthcare Providers:  IncredibleEmployment.be  This test is no t yet approved or cleared by the [place] FDA and  has been authorized for detection and/or diagnosis of SARS-CoV-2 by FDA under an Emergency Use Authorization (EUA). This EUA will remain  in effect (meaning this test can be used) for the duration of the COVID-19 declaration under Section 564(b)(1) of the Act,  21 U.S.C.section 360bbb-3(b)(1), unless the authorization is terminated  or revoked sooner.       Influenza A by PCR NEGATIVE NEGATIVE Final   Influenza B by PCR NEGATIVE NEGATIVE Final    Comment: (NOTE) The Xpert Xpress SARS-CoV-2/FLU/RSV plus assay is intended as an aid in the diagnosis of influenza from Nasopharyngeal swab specimens and should not be used as a sole basis for treatment. Nasal washings and aspirates are unacceptable for Xpert Xpress SARS-CoV-2/FLU/RSV testing.  Fact Sheet for Patients: EntrepreneurPulse.com.au  Fact Sheet for Healthcare Providers: IncredibleEmployment.be  This test is not yet approved or cleared by the [place] FDA and has been authorized for detection and/or diagnosis of SARS-CoV-2 by FDA under an Emergency Use Authorization (EUA). This EUA will remain in effect (meaning this test can be used) for the duration of the COVID-19 declaration under Section 564(b)(1) of the Act, 21 U.S.C. section 360bbb-3(b)(1), unless the authorization is terminated or revoked.  Performed at [facility], [place] [address] [place], [place] [address]   Resp Panel by RT-PCR (Flu A&B, Covid) Nasopharyngeal Swab     Status: None   Collection Time: 05/08/21  8:04 PM   Specimen: Nasopharyngeal Swab; Nasopharyngeal(NP) swabs in vial transport medium  Result Value Ref Range Status   SARS Coronavirus 2 by RT PCR NEGATIVE NEGATIVE Final     Comment: (NOTE) SARS-CoV-2 target nucleic acids are NOT DETECTED.  The SARS-CoV-2 RNA is generally detectable in upper respiratory specimens during the acute phase of infection. The lowest concentration of SARS-CoV-2 viral copies this assay can detect is 138 copies/mL. A negative result does not preclude SARS-Cov-2 infection and should not be used as the sole basis for treatment or other patient management decisions. A negative result may occur with  improper specimen collection/handling, submission of specimen other than nasopharyngeal swab, presence of viral mutation(s) within the areas targeted by this assay, and inadequate number of viral copies(<138 copies/mL). A negative result must be combined with clinical observations, patient history, and epidemiological information. The expected result is Negative.  Fact Sheet for Patients:  EntrepreneurPulse.com.au  Fact Sheet for Healthcare Providers:  IncredibleEmployment.be  This test is no t yet approved or cleared by the [place] FDA and  has been authorized for detection and/or diagnosis of SARS-CoV-2 by FDA under an Emergency Use Authorization (EUA). This EUA will remain  in effect (meaning this test can be used) for the duration of the COVID-19 declaration under Section 564(b)(1) of the Act, 21 U.S.C.section 360bbb-3(b)(1), unless the authorization is terminated  or revoked sooner.       Influenza A by PCR NEGATIVE NEGATIVE Final   Influenza B by PCR NEGATIVE NEGATIVE Final    Comment: (NOTE) The Xpert Xpress SARS-CoV-2/FLU/RSV plus assay is intended as an aid in the diagnosis of influenza from Nasopharyngeal swab specimens and should not be used as a sole basis for treatment. Nasal washings and aspirates are unacceptable for Xpert Xpress SARS-CoV-2/FLU/RSV testing.  Fact Sheet for Patients: EntrepreneurPulse.com.au  Fact Sheet for Healthcare  Providers: IncredibleEmployment.be  This test is not yet approved or cleared by the [place] FDA and has been authorized for detection and/or diagnosis of SARS-CoV-2 by FDA under an Emergency Use Authorization (EUA). This EUA will remain in effect (meaning this test can be used) for the duration of the COVID-19 declaration under Section 564(b)(1) of the Act, 21 U.S.C. section 360bbb-3(b)(1), unless the authorization is terminated or revoked.  Performed at [facility], [place] [address]  [place], [place] [address]   MRSA Next Gen by PCR, Nasal     Status: None   Collection Time: 05/09/21  6:12 PM   Specimen: Nasal Mucosa; Nasal Swab  Result Value Ref Range Status   MRSA by PCR Next Gen NOT DETECTED NOT DETECTED Final    Comment: (NOTE) The GeneXpert MRSA Assay (FDA approved for NASAL specimens only), is one component of a comprehensive MRSA colonization surveillance program. It is not intended to diagnose MRSA infection nor to guide or monitor treatment for MRSA infections. Test performance is not FDA approved in patients less than 73 years old. Performed at [facility], [place] [address] [place], [place] [address]   Resp Panel by RT-PCR (Flu A&B, Covid) Nasopharyngeal Swab     Status: None   Collection Time: 05/10/21 12:29 PM   Specimen: Nasopharyngeal Swab; Nasopharyngeal(NP) swabs in vial transport medium  Result Value Ref Range Status   SARS Coronavirus 2 by RT PCR NEGATIVE NEGATIVE Final    Comment: (NOTE) SARS-CoV-2 target nucleic acids are NOT DETECTED.  The SARS-CoV-2 RNA is generally detectable in upper respiratory specimens during the acute phase of infection. The lowest concentration of SARS-CoV-2 viral copies this assay can detect is 138 copies/mL. A negative result does not preclude SARS-Cov-2 infection and should not be used as the sole basis for treatment or other patient management decisions. A negative result may occur with   improper specimen collection/handling, submission of specimen other than nasopharyngeal swab, presence of viral mutation(s) within the areas targeted by this assay, and inadequate number of viral copies(<138 copies/mL). A negative result must be combined with clinical observations, patient history, and epidemiological information. The expected result is Negative.  Fact Sheet for Patients:  EntrepreneurPulse.com.au  Fact Sheet for Healthcare Providers:  IncredibleEmployment.be  This test is no t yet approved or cleared by the [place] FDA and  has been authorized for detection and/or diagnosis of SARS-CoV-2 by FDA under an Emergency Use Authorization (EUA). This EUA will remain  in effect (meaning this test can be used) for the duration of the COVID-19 declaration under Section 564(b)(1) of the Act, 21 U.S.C.section 360bbb-3(b)(1), unless the authorization is terminated  or revoked sooner.       Influenza A by PCR NEGATIVE NEGATIVE Final   Influenza B by PCR NEGATIVE NEGATIVE Final    Comment: (NOTE) The Xpert Xpress SARS-CoV-2/FLU/RSV plus assay is intended as an aid in the diagnosis of influenza from Nasopharyngeal swab specimens and should not be used as a sole basis for treatment. Nasal washings and aspirates are unacceptable for Xpert Xpress SARS-CoV-2/FLU/RSV testing.  Fact Sheet for Patients: EntrepreneurPulse.com.au  Fact Sheet for Healthcare Providers: IncredibleEmployment.be  This test is not yet approved or cleared by the [place] FDA and has been authorized for detection and/or diagnosis of SARS-CoV-2 by FDA under an Emergency Use Authorization (EUA). This EUA will remain in effect (meaning this test can be used) for the duration of the COVID-19 declaration under Section 564(b)(1) of the Act, 21 U.S.C. section 360bbb-3(b)(1), unless the authorization is terminated  or revoked.  Performed at [facility], [place] [address], [place], [place] [address]      Time coordinating discharge:  I have spent 35 minutes face to face with the patient and on the ward discussing the patients care, assessment, plan and disposition with other care givers. >50% of the time was devoted counseling the patient about the risks and benefits of treatment/Discharge disposition and coordinating care.  SIGNED:   Nita Sells, MD  [facility] 05/11/2021, 8:33 AM   If 7PM-7AM, please contact night-coverage

## 2021-05-11 NOTE — NC FL2 (Signed)
[place] MEDICAID FL2 LEVEL OF CARE SCREENING TOOL     IDENTIFICATION  Patient Name: Sara Fox Birthdate: 07-02-1940 Sex: female Admission Date (Current Location): 05/08/2021  [facility] and [place] Number:  Producer, television/film/video and Address:  [facility], [address], [place], [place] [address]      Provider Number: [id number]  Attending Physician Name and Address:  Rhetta Mura, MD  Relative Name and Phone Number:  Manson Passey, [phone]    Current Level of Care: Hospital Recommended Level of Care: Skilled Nursing Facility Prior Approval Number:    Date Approved/Denied:   PASRR Number:    Discharge Plan: SNF    Current Diagnoses: Patient Active Problem List   Diagnosis Date Noted   Pulmonary embolism (HCC) 05/09/2021   Closed left hip fracture, initial encounter (HCC) 04/26/2021   Gait disturbance 02/27/2021   GERD (gastroesophageal reflux disease) 02/27/2021   Ambulatory dysfunction 02/27/2021   Transaminitis 02/27/2021   Fall (on)(from) incline, initial encounter 11/23/2020   Macrocytosis without anemia 11/22/2020   Unwitnessed fall 11/22/2020   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) 11/18/2020   Visual hallucination 11/18/2020   Sundowning 11/18/2020   Non-small cell carcinoma of right lung, stage 1 (HCC) 06/08/2020   Pulmonary nodule 1 cm or greater in diameter 05/21/2020   Atherosclerotic heart disease of native coronary artery with other forms of angina pectoris (HCC) 04/22/2020   Memory loss 02/18/2019   Vitamin B12 deficiency 10/23/2018   Stage 3 chronic kidney disease (HCC) 06/03/2018   Chronic anemia 06/03/2018   Hypercholesterolemia 06/03/2018   Malnutrition of moderate degree 07/12/2017   Hypoalbuminemia 07/11/2017   Elevated LFTs 07/11/2017   Snoring 12/21/2016   Peripheral musculoskeletal gait disorder 02/05/2015   Lumbar post-laminectomy syndrome 02/05/2015   CAD in native  artery 10/16/2014   NSTEMI (non-ST elevated myocardial infarction) (HCC) 09/13/2014   Cardiomyopathy, ischemic 09/13/2014   Chronic systolic heart failure (HCC)    Tobacco abuse 09/12/2014   Dyslipidemia 09/12/2014   Normocytic anemia 09/12/2014   Chronic diastolic heart failure, NYHA class 1 (HCC) 09/12/2014   Postlaminectomy syndrome, cervical region 08/01/2013   Chronic midline low back pain with left-sided sciatica 08/01/2013   Shoulder joint contracture 08/01/2013   HTN (hypertension) 03/03/2013   Abnormal genetic test 03/03/2013    Orientation RESPIRATION BLADDER Height & Weight     Self  Normal Incontinent, External catheter Weight: 137 lb 12.6 oz (62.5 kg) Height:  5\' 4"  (162.6 cm)  BEHAVIORAL SYMPTOMS/MOOD NEUROLOGICAL BOWEL NUTRITION STATUS      Incontinent Diet (See DC summary)  AMBULATORY STATUS COMMUNICATION OF NEEDS Skin   Extensive Assist Verbally Surgical wounds (L hip incision)                       Personal Care Assistance Level of Assistance  Bathing, Feeding, Dressing           Functional Limitations Info  Sight, Hearing, Speech Sight Info: Impaired Hearing Info: Adequate Speech Info: Adequate    SPECIAL CARE FACTORS FREQUENCY  PT (By licensed PT), OT (By licensed OT)                    Contractures Contractures Info: Not present    Additional Factors Info  Allergies, Code Status, Psychotropic               Current Medications (05/11/2021):  This is the current hospital active medication list Current  Facility-Administered Medications  Medication Dose Route Frequency Provider Last Rate Last Admin   acetaminophen (TYLENOL) tablet 650 mg  650 mg Oral Q6H PRN Eduard Clos, MD       Or   acetaminophen (TYLENOL) suppository 650 mg  650 mg Rectal Q6H PRN Eduard Clos, MD       apixaban Everlene Balls) tablet 10 mg  10 mg Oral BID Scarlett Presto, RPH   10 mg at 05/11/21 1209   Followed by   Melene Muller ON 05/17/2021] apixaban  (ELIQUIS) tablet 5 mg  5 mg Oral BID Scarlett Presto, [facility]       aspirin chewable tablet 81 mg  81 mg Oral BID Eduard Clos, MD   81 mg at 05/11/21 1209   busPIRone (BUSPAR) tablet 5 mg  5 mg Oral BID PRN Eduard Clos, MD       donepezil (ARICEPT) tablet 10 mg  10 mg Oral QHS Eduard Clos, MD   10 mg at 05/10/21 2119   ferrous sulfate tablet 325 mg  325 mg Oral Q breakfast Eduard Clos, MD   325 mg at 05/11/21 1209   furosemide (LASIX) tablet 20 mg  20 mg Oral Once per day on Mon Tue Wed Thu Fri Eduard Clos, MD   20 mg at 05/11/21 1220   gabapentin (NEURONTIN) capsule 100 mg  100 mg Oral BID Eduard Clos, MD   100 mg at 05/11/21 1209   hydrALAZINE (APRESOLINE) injection 10 mg  10 mg Intravenous Q4H PRN Amin, Ankit Chirag, MD       ipratropium-albuterol (DUONEB) 0.5-2.5 (3) MG/3ML nebulizer solution 3 mL  3 mL Nebulization Q4H PRN Amin, Ankit Chirag, MD       memantine (NAMENDA) tablet 5 mg  5 mg Oral Daily Eduard Clos, MD   5 mg at 05/11/21 1210   methocarbamol (ROBAXIN) tablet 500 mg  500 mg Oral Q6H PRN Eduard Clos, MD       metoprolol tartrate (LOPRESSOR) injection 5 mg  5 mg Intravenous Q4H PRN Amin, Ankit Chirag, MD       metoprolol tartrate (LOPRESSOR) tablet 25 mg  25 mg Oral BID Eduard Clos, MD   25 mg at 05/11/21 1209   mometasone-formoterol (DULERA) 200-5 MCG/ACT inhaler 2 puff  2 puff Inhalation BID Eduard Clos, MD   2 puff at 05/09/21 1039   oxybutynin (DITROPAN) tablet 2.5 mg  2.5 mg Oral BID Eduard Clos, MD   2.5 mg at 05/11/21 1209   oxyCODONE (Oxy IR/ROXICODONE) immediate release tablet 5 mg  5 mg Oral Q4H PRN Amin, Ankit Chirag, MD       pantoprazole (PROTONIX) EC tablet 40 mg  40 mg Oral Daily Eduard Clos, MD   40 mg at 05/11/21 1210   QUEtiapine (SEROQUEL) tablet 50 mg  50 mg Oral QHS Eduard Clos, MD   50 mg at 05/10/21 2119   rosuvastatin (CRESTOR) tablet 40 mg  40 mg  Oral Daily Eduard Clos, MD   40 mg at 05/11/21 1209   senna-docusate (Senokot-S) tablet 1 tablet  1 tablet Oral QHS PRN Amin, Ankit Chirag, MD       traMADol (ULTRAM) tablet 50 mg  50 mg Oral TID PRN Eduard Clos, MD       traZODone (DESYREL) tablet 50 mg  50 mg Oral QHS PRN Dimple Nanas, MD         Discharge  Medications: Please see discharge summary for a list of discharge medications.  Relevant Imaging Results:  Relevant Lab Results:   Additional Information ss# [id number]. COVID vaccinated (09/15/19, 08/30/19)  Offie Pickron Wynn Banker, LCSWA

## 2021-05-11 NOTE — TOC Transition Note (Signed)
Transition of Care [facility]) - CM/SW Discharge Note   Patient Details  Name: Sara Fox MRN: [id number] Date of Birth: 17-Dec-1940  [facility] CM/SW Contact:  Tresa Endo Phone Number: 05/11/2021, 3:44 PM   Clinical Narrative:    Patient will DC to: [place] Anticipated DC date: 05/11/2021 Family notified: Pt Son Transport by: Corey Harold   Per MD patient ready for DC to [facility]. RN to call report prior to discharge [phone]). RN, patient, patient's family, and facility notified of DC. Discharge Summary and FL2 sent to facility. DC packet on chart. Ambulance transport requested for patient.   CSW will sign off for now as social work intervention is no longer needed. Please consult [place] again if new needs arise.           Patient Goals and CMS Choice        Discharge Placement                       Discharge Plan and Services                                     Social Determinants of Health (SDOH) Interventions     Readmission Risk Interventions No flowsheet data found.

## 2021-05-14 DIAGNOSIS — C3491 Malignant neoplasm of unspecified part of right bronchus or lung: Secondary | ICD-10-CM

## 2021-05-14 DIAGNOSIS — G301 Alzheimer's disease with late onset: Secondary | ICD-10-CM

## 2021-05-14 DIAGNOSIS — I5032 Chronic diastolic (congestive) heart failure: Secondary | ICD-10-CM

## 2021-05-14 DIAGNOSIS — N1831 Chronic kidney disease, stage 3a: Secondary | ICD-10-CM

## 2021-05-14 DIAGNOSIS — I25118 Atherosclerotic heart disease of native coronary artery with other forms of angina pectoris: Secondary | ICD-10-CM

## 2021-05-14 DIAGNOSIS — I10 Essential (primary) hypertension: Secondary | ICD-10-CM | POA: Diagnosis not present

## 2021-05-14 DIAGNOSIS — F02818 Dementia in other diseases classified elsewhere, unspecified severity, with other behavioral disturbance: Secondary | ICD-10-CM

## 2021-05-15 DIAGNOSIS — M170 Bilateral primary osteoarthritis of knee: Secondary | ICD-10-CM | POA: Diagnosis not present

## 2021-05-15 DIAGNOSIS — G301 Alzheimer's disease with late onset: Secondary | ICD-10-CM | POA: Diagnosis not present

## 2021-05-15 DIAGNOSIS — Z85118 Personal history of other malignant neoplasm of bronchus and lung: Secondary | ICD-10-CM | POA: Diagnosis not present

## 2021-05-15 DIAGNOSIS — E785 Hyperlipidemia, unspecified: Secondary | ICD-10-CM | POA: Diagnosis not present

## 2021-05-15 DIAGNOSIS — R451 Restlessness and agitation: Secondary | ICD-10-CM | POA: Diagnosis not present

## 2021-05-15 DIAGNOSIS — M549 Dorsalgia, unspecified: Secondary | ICD-10-CM | POA: Diagnosis not present

## 2021-05-15 DIAGNOSIS — S72002D Fracture of unspecified part of neck of left femur, subsequent encounter for closed fracture with routine healing: Secondary | ICD-10-CM | POA: Diagnosis not present

## 2021-05-15 DIAGNOSIS — G309 Alzheimer's disease, unspecified: Secondary | ICD-10-CM | POA: Diagnosis not present

## 2021-05-15 DIAGNOSIS — R5381 Other malaise: Secondary | ICD-10-CM | POA: Diagnosis not present

## 2021-05-15 DIAGNOSIS — I5032 Chronic diastolic (congestive) heart failure: Secondary | ICD-10-CM | POA: Diagnosis not present

## 2021-05-15 DIAGNOSIS — D508 Other iron deficiency anemias: Secondary | ICD-10-CM | POA: Diagnosis not present

## 2021-05-15 DIAGNOSIS — R54 Age-related physical debility: Secondary | ICD-10-CM | POA: Diagnosis not present

## 2021-05-15 DIAGNOSIS — E7800 Pure hypercholesterolemia, unspecified: Secondary | ICD-10-CM | POA: Diagnosis not present

## 2021-05-15 DIAGNOSIS — I2699 Other pulmonary embolism without acute cor pulmonale: Secondary | ICD-10-CM | POA: Diagnosis not present

## 2021-05-15 DIAGNOSIS — I255 Ischemic cardiomyopathy: Secondary | ICD-10-CM | POA: Diagnosis not present

## 2021-05-15 DIAGNOSIS — I10 Essential (primary) hypertension: Secondary | ICD-10-CM | POA: Diagnosis not present

## 2021-05-15 DIAGNOSIS — J441 Chronic obstructive pulmonary disease with (acute) exacerbation: Secondary | ICD-10-CM | POA: Diagnosis not present

## 2021-05-15 DIAGNOSIS — S22000D Wedge compression fracture of unspecified thoracic vertebra, subsequent encounter for fracture with routine healing: Secondary | ICD-10-CM | POA: Diagnosis not present

## 2021-05-15 DIAGNOSIS — K219 Gastro-esophageal reflux disease without esophagitis: Secondary | ICD-10-CM | POA: Diagnosis not present

## 2021-05-15 DIAGNOSIS — C3491 Malignant neoplasm of unspecified part of right bronchus or lung: Secondary | ICD-10-CM | POA: Diagnosis not present

## 2021-05-15 DIAGNOSIS — R279 Unspecified lack of coordination: Secondary | ICD-10-CM | POA: Diagnosis not present

## 2021-05-15 DIAGNOSIS — R262 Difficulty in walking, not elsewhere classified: Secondary | ICD-10-CM | POA: Diagnosis not present

## 2021-05-18 ENCOUNTER — Telehealth: Payer: Self-pay | Admitting: Hospice

## 2021-05-18 DIAGNOSIS — Z515 Encounter for palliative care: Secondary | ICD-10-CM

## 2021-05-18 NOTE — Telephone Encounter (Signed)
NP called and spoke with patient's son [name] who reports patient in LTC for short term rehab. He will call NP when patient returns home to set up a follow up visit.

## 2021-05-23 ENCOUNTER — Ambulatory Visit: Payer: Medicare Other | Admitting: Family Medicine

## 2021-05-24 ENCOUNTER — Ambulatory Visit: Payer: Medicare Other | Admitting: Neurology

## 2021-05-24 DIAGNOSIS — I5032 Chronic diastolic (congestive) heart failure: Secondary | ICD-10-CM | POA: Diagnosis not present

## 2021-05-24 DIAGNOSIS — E7800 Pure hypercholesterolemia, unspecified: Secondary | ICD-10-CM | POA: Diagnosis not present

## 2021-05-24 DIAGNOSIS — R54 Age-related physical debility: Secondary | ICD-10-CM | POA: Diagnosis not present

## 2021-05-24 DIAGNOSIS — G301 Alzheimer's disease with late onset: Secondary | ICD-10-CM | POA: Diagnosis not present

## 2021-05-24 DIAGNOSIS — J441 Chronic obstructive pulmonary disease with (acute) exacerbation: Secondary | ICD-10-CM | POA: Diagnosis not present

## 2021-05-24 DIAGNOSIS — I255 Ischemic cardiomyopathy: Secondary | ICD-10-CM | POA: Diagnosis not present

## 2021-05-24 DIAGNOSIS — Z85118 Personal history of other malignant neoplasm of bronchus and lung: Secondary | ICD-10-CM | POA: Diagnosis not present

## 2021-05-25 DIAGNOSIS — M549 Dorsalgia, unspecified: Secondary | ICD-10-CM | POA: Diagnosis not present

## 2021-05-25 DIAGNOSIS — I2699 Other pulmonary embolism without acute cor pulmonale: Secondary | ICD-10-CM | POA: Diagnosis not present

## 2021-05-25 DIAGNOSIS — E785 Hyperlipidemia, unspecified: Secondary | ICD-10-CM | POA: Diagnosis not present

## 2021-05-25 DIAGNOSIS — D508 Other iron deficiency anemias: Secondary | ICD-10-CM | POA: Diagnosis not present

## 2021-05-25 DIAGNOSIS — R451 Restlessness and agitation: Secondary | ICD-10-CM | POA: Diagnosis not present

## 2021-05-25 DIAGNOSIS — G309 Alzheimer's disease, unspecified: Secondary | ICD-10-CM | POA: Diagnosis not present

## 2021-05-25 DIAGNOSIS — R5381 Other malaise: Secondary | ICD-10-CM | POA: Diagnosis not present

## 2021-05-25 DIAGNOSIS — K219 Gastro-esophageal reflux disease without esophagitis: Secondary | ICD-10-CM | POA: Diagnosis not present

## 2021-05-25 DIAGNOSIS — I10 Essential (primary) hypertension: Secondary | ICD-10-CM | POA: Diagnosis not present

## 2021-05-26 DIAGNOSIS — R262 Difficulty in walking, not elsewhere classified: Secondary | ICD-10-CM | POA: Diagnosis not present

## 2021-05-30 ENCOUNTER — Ambulatory Visit: Payer: Medicare Other | Admitting: Neurology

## 2021-05-31 DIAGNOSIS — J449 Chronic obstructive pulmonary disease, unspecified: Secondary | ICD-10-CM | POA: Diagnosis not present

## 2021-05-31 DIAGNOSIS — D509 Iron deficiency anemia, unspecified: Secondary | ICD-10-CM | POA: Diagnosis not present

## 2021-05-31 DIAGNOSIS — K219 Gastro-esophageal reflux disease without esophagitis: Secondary | ICD-10-CM | POA: Diagnosis not present

## 2021-05-31 DIAGNOSIS — I5042 Chronic combined systolic (congestive) and diastolic (congestive) heart failure: Secondary | ICD-10-CM | POA: Diagnosis not present

## 2021-05-31 DIAGNOSIS — G301 Alzheimer's disease with late onset: Secondary | ICD-10-CM | POA: Diagnosis not present

## 2021-05-31 DIAGNOSIS — W19XXXD Unspecified fall, subsequent encounter: Secondary | ICD-10-CM | POA: Diagnosis not present

## 2021-05-31 DIAGNOSIS — F02811 Dementia in other diseases classified elsewhere, unspecified severity, with agitation: Secondary | ICD-10-CM | POA: Diagnosis not present

## 2021-05-31 DIAGNOSIS — F17210 Nicotine dependence, cigarettes, uncomplicated: Secondary | ICD-10-CM | POA: Diagnosis not present

## 2021-05-31 DIAGNOSIS — S72002D Fracture of unspecified part of neck of left femur, subsequent encounter for closed fracture with routine healing: Secondary | ICD-10-CM | POA: Diagnosis not present

## 2021-05-31 DIAGNOSIS — N183 Chronic kidney disease, stage 3 unspecified: Secondary | ICD-10-CM | POA: Diagnosis not present

## 2021-05-31 DIAGNOSIS — E785 Hyperlipidemia, unspecified: Secondary | ICD-10-CM | POA: Diagnosis not present

## 2021-05-31 DIAGNOSIS — E538 Deficiency of other specified B group vitamins: Secondary | ICD-10-CM | POA: Diagnosis not present

## 2021-05-31 DIAGNOSIS — I130 Hypertensive heart and chronic kidney disease with heart failure and stage 1 through stage 4 chronic kidney disease, or unspecified chronic kidney disease: Secondary | ICD-10-CM | POA: Diagnosis not present

## 2021-05-31 DIAGNOSIS — C3491 Malignant neoplasm of unspecified part of right bronchus or lung: Secondary | ICD-10-CM | POA: Diagnosis not present

## 2021-05-31 DIAGNOSIS — Z9181 History of falling: Secondary | ICD-10-CM | POA: Diagnosis not present

## 2021-05-31 DIAGNOSIS — G309 Alzheimer's disease, unspecified: Secondary | ICD-10-CM | POA: Diagnosis not present

## 2021-05-31 DIAGNOSIS — G4733 Obstructive sleep apnea (adult) (pediatric): Secondary | ICD-10-CM | POA: Diagnosis not present

## 2021-05-31 DIAGNOSIS — Z7901 Long term (current) use of anticoagulants: Secondary | ICD-10-CM | POA: Diagnosis not present

## 2021-05-31 DIAGNOSIS — Z993 Dependence on wheelchair: Secondary | ICD-10-CM | POA: Diagnosis not present

## 2021-05-31 DIAGNOSIS — M4807 Spinal stenosis, lumbosacral region: Secondary | ICD-10-CM | POA: Diagnosis not present

## 2021-06-01 DIAGNOSIS — N183 Chronic kidney disease, stage 3 unspecified: Secondary | ICD-10-CM | POA: Diagnosis not present

## 2021-06-01 DIAGNOSIS — I5042 Chronic combined systolic (congestive) and diastolic (congestive) heart failure: Secondary | ICD-10-CM | POA: Diagnosis not present

## 2021-06-01 DIAGNOSIS — E785 Hyperlipidemia, unspecified: Secondary | ICD-10-CM | POA: Diagnosis not present

## 2021-06-01 DIAGNOSIS — W19XXXD Unspecified fall, subsequent encounter: Secondary | ICD-10-CM | POA: Diagnosis not present

## 2021-06-01 DIAGNOSIS — E538 Deficiency of other specified B group vitamins: Secondary | ICD-10-CM | POA: Diagnosis not present

## 2021-06-01 DIAGNOSIS — G4733 Obstructive sleep apnea (adult) (pediatric): Secondary | ICD-10-CM | POA: Diagnosis not present

## 2021-06-01 DIAGNOSIS — J449 Chronic obstructive pulmonary disease, unspecified: Secondary | ICD-10-CM | POA: Diagnosis not present

## 2021-06-01 DIAGNOSIS — F17210 Nicotine dependence, cigarettes, uncomplicated: Secondary | ICD-10-CM | POA: Diagnosis not present

## 2021-06-01 DIAGNOSIS — G309 Alzheimer's disease, unspecified: Secondary | ICD-10-CM | POA: Diagnosis not present

## 2021-06-01 DIAGNOSIS — K219 Gastro-esophageal reflux disease without esophagitis: Secondary | ICD-10-CM | POA: Diagnosis not present

## 2021-06-01 DIAGNOSIS — S72002D Fracture of unspecified part of neck of left femur, subsequent encounter for closed fracture with routine healing: Secondary | ICD-10-CM | POA: Diagnosis not present

## 2021-06-01 DIAGNOSIS — I130 Hypertensive heart and chronic kidney disease with heart failure and stage 1 through stage 4 chronic kidney disease, or unspecified chronic kidney disease: Secondary | ICD-10-CM | POA: Diagnosis not present

## 2021-06-01 DIAGNOSIS — F02811 Dementia in other diseases classified elsewhere, unspecified severity, with agitation: Secondary | ICD-10-CM | POA: Diagnosis not present

## 2021-06-01 DIAGNOSIS — Z9181 History of falling: Secondary | ICD-10-CM | POA: Diagnosis not present

## 2021-06-01 DIAGNOSIS — M4807 Spinal stenosis, lumbosacral region: Secondary | ICD-10-CM | POA: Diagnosis not present

## 2021-06-01 DIAGNOSIS — Z993 Dependence on wheelchair: Secondary | ICD-10-CM | POA: Diagnosis not present

## 2021-06-01 DIAGNOSIS — C3491 Malignant neoplasm of unspecified part of right bronchus or lung: Secondary | ICD-10-CM | POA: Diagnosis not present

## 2021-06-01 DIAGNOSIS — D509 Iron deficiency anemia, unspecified: Secondary | ICD-10-CM | POA: Diagnosis not present

## 2021-06-07 DIAGNOSIS — K623 Rectal prolapse: Secondary | ICD-10-CM | POA: Diagnosis not present

## 2021-06-07 DIAGNOSIS — G309 Alzheimer's disease, unspecified: Secondary | ICD-10-CM | POA: Diagnosis not present

## 2021-06-07 DIAGNOSIS — K5900 Constipation, unspecified: Secondary | ICD-10-CM | POA: Diagnosis not present

## 2021-06-07 DIAGNOSIS — I130 Hypertensive heart and chronic kidney disease with heart failure and stage 1 through stage 4 chronic kidney disease, or unspecified chronic kidney disease: Secondary | ICD-10-CM | POA: Diagnosis not present

## 2021-06-07 DIAGNOSIS — Z993 Dependence on wheelchair: Secondary | ICD-10-CM | POA: Diagnosis not present

## 2021-06-07 DIAGNOSIS — D509 Iron deficiency anemia, unspecified: Secondary | ICD-10-CM | POA: Diagnosis not present

## 2021-06-07 DIAGNOSIS — F02811 Dementia in other diseases classified elsewhere, unspecified severity, with agitation: Secondary | ICD-10-CM | POA: Diagnosis not present

## 2021-06-07 DIAGNOSIS — M4807 Spinal stenosis, lumbosacral region: Secondary | ICD-10-CM | POA: Diagnosis not present

## 2021-06-07 DIAGNOSIS — G301 Alzheimer's disease with late onset: Secondary | ICD-10-CM | POA: Diagnosis not present

## 2021-06-07 DIAGNOSIS — I5042 Chronic combined systolic (congestive) and diastolic (congestive) heart failure: Secondary | ICD-10-CM | POA: Diagnosis not present

## 2021-06-07 DIAGNOSIS — N183 Chronic kidney disease, stage 3 unspecified: Secondary | ICD-10-CM | POA: Diagnosis not present

## 2021-06-07 DIAGNOSIS — Z9181 History of falling: Secondary | ICD-10-CM | POA: Diagnosis not present

## 2021-06-07 DIAGNOSIS — F17210 Nicotine dependence, cigarettes, uncomplicated: Secondary | ICD-10-CM | POA: Diagnosis not present

## 2021-06-07 DIAGNOSIS — G4733 Obstructive sleep apnea (adult) (pediatric): Secondary | ICD-10-CM | POA: Diagnosis not present

## 2021-06-07 DIAGNOSIS — S72002D Fracture of unspecified part of neck of left femur, subsequent encounter for closed fracture with routine healing: Secondary | ICD-10-CM | POA: Diagnosis not present

## 2021-06-07 DIAGNOSIS — K219 Gastro-esophageal reflux disease without esophagitis: Secondary | ICD-10-CM | POA: Diagnosis not present

## 2021-06-07 DIAGNOSIS — E538 Deficiency of other specified B group vitamins: Secondary | ICD-10-CM | POA: Diagnosis not present

## 2021-06-07 DIAGNOSIS — J449 Chronic obstructive pulmonary disease, unspecified: Secondary | ICD-10-CM | POA: Diagnosis not present

## 2021-06-07 DIAGNOSIS — W19XXXD Unspecified fall, subsequent encounter: Secondary | ICD-10-CM | POA: Diagnosis not present

## 2021-06-07 DIAGNOSIS — E785 Hyperlipidemia, unspecified: Secondary | ICD-10-CM | POA: Diagnosis not present

## 2021-06-07 DIAGNOSIS — C3491 Malignant neoplasm of unspecified part of right bronchus or lung: Secondary | ICD-10-CM | POA: Diagnosis not present

## 2021-06-08 DIAGNOSIS — E538 Deficiency of other specified B group vitamins: Secondary | ICD-10-CM | POA: Diagnosis not present

## 2021-06-08 DIAGNOSIS — F17210 Nicotine dependence, cigarettes, uncomplicated: Secondary | ICD-10-CM | POA: Diagnosis not present

## 2021-06-08 DIAGNOSIS — G309 Alzheimer's disease, unspecified: Secondary | ICD-10-CM | POA: Diagnosis not present

## 2021-06-08 DIAGNOSIS — F02811 Dementia in other diseases classified elsewhere, unspecified severity, with agitation: Secondary | ICD-10-CM | POA: Diagnosis not present

## 2021-06-08 DIAGNOSIS — S72002D Fracture of unspecified part of neck of left femur, subsequent encounter for closed fracture with routine healing: Secondary | ICD-10-CM | POA: Diagnosis not present

## 2021-06-08 DIAGNOSIS — D509 Iron deficiency anemia, unspecified: Secondary | ICD-10-CM | POA: Diagnosis not present

## 2021-06-08 DIAGNOSIS — R2681 Unsteadiness on feet: Secondary | ICD-10-CM | POA: Diagnosis not present

## 2021-06-08 DIAGNOSIS — Z993 Dependence on wheelchair: Secondary | ICD-10-CM | POA: Diagnosis not present

## 2021-06-08 DIAGNOSIS — J449 Chronic obstructive pulmonary disease, unspecified: Secondary | ICD-10-CM | POA: Diagnosis not present

## 2021-06-08 DIAGNOSIS — W19XXXD Unspecified fall, subsequent encounter: Secondary | ICD-10-CM | POA: Diagnosis not present

## 2021-06-08 DIAGNOSIS — E785 Hyperlipidemia, unspecified: Secondary | ICD-10-CM | POA: Diagnosis not present

## 2021-06-08 DIAGNOSIS — Z9181 History of falling: Secondary | ICD-10-CM | POA: Diagnosis not present

## 2021-06-08 DIAGNOSIS — I5042 Chronic combined systolic (congestive) and diastolic (congestive) heart failure: Secondary | ICD-10-CM | POA: Diagnosis not present

## 2021-06-08 DIAGNOSIS — M4807 Spinal stenosis, lumbosacral region: Secondary | ICD-10-CM | POA: Diagnosis not present

## 2021-06-08 DIAGNOSIS — C3491 Malignant neoplasm of unspecified part of right bronchus or lung: Secondary | ICD-10-CM | POA: Diagnosis not present

## 2021-06-08 DIAGNOSIS — G4733 Obstructive sleep apnea (adult) (pediatric): Secondary | ICD-10-CM | POA: Diagnosis not present

## 2021-06-08 DIAGNOSIS — I130 Hypertensive heart and chronic kidney disease with heart failure and stage 1 through stage 4 chronic kidney disease, or unspecified chronic kidney disease: Secondary | ICD-10-CM | POA: Diagnosis not present

## 2021-06-08 DIAGNOSIS — N183 Chronic kidney disease, stage 3 unspecified: Secondary | ICD-10-CM | POA: Diagnosis not present

## 2021-06-08 DIAGNOSIS — K219 Gastro-esophageal reflux disease without esophagitis: Secondary | ICD-10-CM | POA: Diagnosis not present

## 2021-06-09 DIAGNOSIS — S72002D Fracture of unspecified part of neck of left femur, subsequent encounter for closed fracture with routine healing: Secondary | ICD-10-CM | POA: Diagnosis not present

## 2021-06-09 DIAGNOSIS — F17210 Nicotine dependence, cigarettes, uncomplicated: Secondary | ICD-10-CM | POA: Diagnosis not present

## 2021-06-09 DIAGNOSIS — I130 Hypertensive heart and chronic kidney disease with heart failure and stage 1 through stage 4 chronic kidney disease, or unspecified chronic kidney disease: Secondary | ICD-10-CM | POA: Diagnosis not present

## 2021-06-09 DIAGNOSIS — Z9181 History of falling: Secondary | ICD-10-CM | POA: Diagnosis not present

## 2021-06-09 DIAGNOSIS — F02811 Dementia in other diseases classified elsewhere, unspecified severity, with agitation: Secondary | ICD-10-CM | POA: Diagnosis not present

## 2021-06-09 DIAGNOSIS — G309 Alzheimer's disease, unspecified: Secondary | ICD-10-CM | POA: Diagnosis not present

## 2021-06-09 DIAGNOSIS — E538 Deficiency of other specified B group vitamins: Secondary | ICD-10-CM | POA: Diagnosis not present

## 2021-06-09 DIAGNOSIS — W19XXXD Unspecified fall, subsequent encounter: Secondary | ICD-10-CM | POA: Diagnosis not present

## 2021-06-09 DIAGNOSIS — E785 Hyperlipidemia, unspecified: Secondary | ICD-10-CM | POA: Diagnosis not present

## 2021-06-09 DIAGNOSIS — Z993 Dependence on wheelchair: Secondary | ICD-10-CM | POA: Diagnosis not present

## 2021-06-09 DIAGNOSIS — K219 Gastro-esophageal reflux disease without esophagitis: Secondary | ICD-10-CM | POA: Diagnosis not present

## 2021-06-09 DIAGNOSIS — J449 Chronic obstructive pulmonary disease, unspecified: Secondary | ICD-10-CM | POA: Diagnosis not present

## 2021-06-09 DIAGNOSIS — I5042 Chronic combined systolic (congestive) and diastolic (congestive) heart failure: Secondary | ICD-10-CM | POA: Diagnosis not present

## 2021-06-09 DIAGNOSIS — D509 Iron deficiency anemia, unspecified: Secondary | ICD-10-CM | POA: Diagnosis not present

## 2021-06-09 DIAGNOSIS — M4807 Spinal stenosis, lumbosacral region: Secondary | ICD-10-CM | POA: Diagnosis not present

## 2021-06-09 DIAGNOSIS — N183 Chronic kidney disease, stage 3 unspecified: Secondary | ICD-10-CM | POA: Diagnosis not present

## 2021-06-09 DIAGNOSIS — C3491 Malignant neoplasm of unspecified part of right bronchus or lung: Secondary | ICD-10-CM | POA: Diagnosis not present

## 2021-06-09 DIAGNOSIS — G4733 Obstructive sleep apnea (adult) (pediatric): Secondary | ICD-10-CM | POA: Diagnosis not present

## 2021-06-13 DIAGNOSIS — F17210 Nicotine dependence, cigarettes, uncomplicated: Secondary | ICD-10-CM | POA: Diagnosis not present

## 2021-06-13 DIAGNOSIS — K219 Gastro-esophageal reflux disease without esophagitis: Secondary | ICD-10-CM | POA: Diagnosis not present

## 2021-06-13 DIAGNOSIS — G301 Alzheimer's disease with late onset: Secondary | ICD-10-CM | POA: Diagnosis not present

## 2021-06-13 DIAGNOSIS — I5042 Chronic combined systolic (congestive) and diastolic (congestive) heart failure: Secondary | ICD-10-CM | POA: Diagnosis not present

## 2021-06-13 DIAGNOSIS — W19XXXD Unspecified fall, subsequent encounter: Secondary | ICD-10-CM | POA: Diagnosis not present

## 2021-06-13 DIAGNOSIS — Z9181 History of falling: Secondary | ICD-10-CM | POA: Diagnosis not present

## 2021-06-13 DIAGNOSIS — G309 Alzheimer's disease, unspecified: Secondary | ICD-10-CM | POA: Diagnosis not present

## 2021-06-13 DIAGNOSIS — R531 Weakness: Secondary | ICD-10-CM | POA: Diagnosis not present

## 2021-06-13 DIAGNOSIS — M4807 Spinal stenosis, lumbosacral region: Secondary | ICD-10-CM | POA: Diagnosis not present

## 2021-06-13 DIAGNOSIS — Z993 Dependence on wheelchair: Secondary | ICD-10-CM | POA: Diagnosis not present

## 2021-06-13 DIAGNOSIS — I130 Hypertensive heart and chronic kidney disease with heart failure and stage 1 through stage 4 chronic kidney disease, or unspecified chronic kidney disease: Secondary | ICD-10-CM | POA: Diagnosis not present

## 2021-06-13 DIAGNOSIS — N183 Chronic kidney disease, stage 3 unspecified: Secondary | ICD-10-CM | POA: Diagnosis not present

## 2021-06-13 DIAGNOSIS — G4733 Obstructive sleep apnea (adult) (pediatric): Secondary | ICD-10-CM | POA: Diagnosis not present

## 2021-06-13 DIAGNOSIS — F02811 Dementia in other diseases classified elsewhere, unspecified severity, with agitation: Secondary | ICD-10-CM | POA: Diagnosis not present

## 2021-06-13 DIAGNOSIS — S72002D Fracture of unspecified part of neck of left femur, subsequent encounter for closed fracture with routine healing: Secondary | ICD-10-CM | POA: Diagnosis not present

## 2021-06-13 DIAGNOSIS — E538 Deficiency of other specified B group vitamins: Secondary | ICD-10-CM | POA: Diagnosis not present

## 2021-06-13 DIAGNOSIS — R2681 Unsteadiness on feet: Secondary | ICD-10-CM | POA: Diagnosis not present

## 2021-06-13 DIAGNOSIS — J449 Chronic obstructive pulmonary disease, unspecified: Secondary | ICD-10-CM | POA: Diagnosis not present

## 2021-06-13 DIAGNOSIS — E785 Hyperlipidemia, unspecified: Secondary | ICD-10-CM | POA: Diagnosis not present

## 2021-06-13 DIAGNOSIS — D509 Iron deficiency anemia, unspecified: Secondary | ICD-10-CM | POA: Diagnosis not present

## 2021-06-13 DIAGNOSIS — C3491 Malignant neoplasm of unspecified part of right bronchus or lung: Secondary | ICD-10-CM | POA: Diagnosis not present

## 2021-06-17 DIAGNOSIS — G4733 Obstructive sleep apnea (adult) (pediatric): Secondary | ICD-10-CM | POA: Diagnosis not present

## 2021-06-17 DIAGNOSIS — E538 Deficiency of other specified B group vitamins: Secondary | ICD-10-CM | POA: Diagnosis not present

## 2021-06-17 DIAGNOSIS — W19XXXD Unspecified fall, subsequent encounter: Secondary | ICD-10-CM | POA: Diagnosis not present

## 2021-06-17 DIAGNOSIS — I130 Hypertensive heart and chronic kidney disease with heart failure and stage 1 through stage 4 chronic kidney disease, or unspecified chronic kidney disease: Secondary | ICD-10-CM | POA: Diagnosis not present

## 2021-06-17 DIAGNOSIS — K219 Gastro-esophageal reflux disease without esophagitis: Secondary | ICD-10-CM | POA: Diagnosis not present

## 2021-06-17 DIAGNOSIS — M4807 Spinal stenosis, lumbosacral region: Secondary | ICD-10-CM | POA: Diagnosis not present

## 2021-06-17 DIAGNOSIS — F17210 Nicotine dependence, cigarettes, uncomplicated: Secondary | ICD-10-CM | POA: Diagnosis not present

## 2021-06-17 DIAGNOSIS — E785 Hyperlipidemia, unspecified: Secondary | ICD-10-CM | POA: Diagnosis not present

## 2021-06-17 DIAGNOSIS — N183 Chronic kidney disease, stage 3 unspecified: Secondary | ICD-10-CM | POA: Diagnosis not present

## 2021-06-17 DIAGNOSIS — J449 Chronic obstructive pulmonary disease, unspecified: Secondary | ICD-10-CM | POA: Diagnosis not present

## 2021-06-17 DIAGNOSIS — Z9181 History of falling: Secondary | ICD-10-CM | POA: Diagnosis not present

## 2021-06-17 DIAGNOSIS — I5042 Chronic combined systolic (congestive) and diastolic (congestive) heart failure: Secondary | ICD-10-CM | POA: Diagnosis not present

## 2021-06-17 DIAGNOSIS — C3491 Malignant neoplasm of unspecified part of right bronchus or lung: Secondary | ICD-10-CM | POA: Diagnosis not present

## 2021-06-17 DIAGNOSIS — Z993 Dependence on wheelchair: Secondary | ICD-10-CM | POA: Diagnosis not present

## 2021-06-17 DIAGNOSIS — G309 Alzheimer's disease, unspecified: Secondary | ICD-10-CM | POA: Diagnosis not present

## 2021-06-17 DIAGNOSIS — F02811 Dementia in other diseases classified elsewhere, unspecified severity, with agitation: Secondary | ICD-10-CM | POA: Diagnosis not present

## 2021-06-17 DIAGNOSIS — S72002D Fracture of unspecified part of neck of left femur, subsequent encounter for closed fracture with routine healing: Secondary | ICD-10-CM | POA: Diagnosis not present

## 2021-06-17 DIAGNOSIS — D509 Iron deficiency anemia, unspecified: Secondary | ICD-10-CM | POA: Diagnosis not present

## 2021-06-20 DIAGNOSIS — D631 Anemia in chronic kidney disease: Secondary | ICD-10-CM | POA: Diagnosis not present

## 2021-06-21 DIAGNOSIS — W19XXXD Unspecified fall, subsequent encounter: Secondary | ICD-10-CM | POA: Diagnosis not present

## 2021-06-21 DIAGNOSIS — E785 Hyperlipidemia, unspecified: Secondary | ICD-10-CM | POA: Diagnosis not present

## 2021-06-21 DIAGNOSIS — D509 Iron deficiency anemia, unspecified: Secondary | ICD-10-CM | POA: Diagnosis not present

## 2021-06-21 DIAGNOSIS — E538 Deficiency of other specified B group vitamins: Secondary | ICD-10-CM | POA: Diagnosis not present

## 2021-06-21 DIAGNOSIS — I5042 Chronic combined systolic (congestive) and diastolic (congestive) heart failure: Secondary | ICD-10-CM | POA: Diagnosis not present

## 2021-06-21 DIAGNOSIS — N183 Chronic kidney disease, stage 3 unspecified: Secondary | ICD-10-CM | POA: Diagnosis not present

## 2021-06-21 DIAGNOSIS — G309 Alzheimer's disease, unspecified: Secondary | ICD-10-CM | POA: Diagnosis not present

## 2021-06-21 DIAGNOSIS — J449 Chronic obstructive pulmonary disease, unspecified: Secondary | ICD-10-CM | POA: Diagnosis not present

## 2021-06-21 DIAGNOSIS — F17210 Nicotine dependence, cigarettes, uncomplicated: Secondary | ICD-10-CM | POA: Diagnosis not present

## 2021-06-21 DIAGNOSIS — Z9181 History of falling: Secondary | ICD-10-CM | POA: Diagnosis not present

## 2021-06-21 DIAGNOSIS — I130 Hypertensive heart and chronic kidney disease with heart failure and stage 1 through stage 4 chronic kidney disease, or unspecified chronic kidney disease: Secondary | ICD-10-CM | POA: Diagnosis not present

## 2021-06-21 DIAGNOSIS — G4733 Obstructive sleep apnea (adult) (pediatric): Secondary | ICD-10-CM | POA: Diagnosis not present

## 2021-06-21 DIAGNOSIS — Z993 Dependence on wheelchair: Secondary | ICD-10-CM | POA: Diagnosis not present

## 2021-06-21 DIAGNOSIS — M4807 Spinal stenosis, lumbosacral region: Secondary | ICD-10-CM | POA: Diagnosis not present

## 2021-06-21 DIAGNOSIS — F02811 Dementia in other diseases classified elsewhere, unspecified severity, with agitation: Secondary | ICD-10-CM | POA: Diagnosis not present

## 2021-06-21 DIAGNOSIS — C3491 Malignant neoplasm of unspecified part of right bronchus or lung: Secondary | ICD-10-CM | POA: Diagnosis not present

## 2021-06-21 DIAGNOSIS — S72002D Fracture of unspecified part of neck of left femur, subsequent encounter for closed fracture with routine healing: Secondary | ICD-10-CM | POA: Diagnosis not present

## 2021-06-21 DIAGNOSIS — K219 Gastro-esophageal reflux disease without esophagitis: Secondary | ICD-10-CM | POA: Diagnosis not present

## 2021-06-22 DIAGNOSIS — I130 Hypertensive heart and chronic kidney disease with heart failure and stage 1 through stage 4 chronic kidney disease, or unspecified chronic kidney disease: Secondary | ICD-10-CM | POA: Diagnosis not present

## 2021-06-22 DIAGNOSIS — M4807 Spinal stenosis, lumbosacral region: Secondary | ICD-10-CM | POA: Diagnosis not present

## 2021-06-22 DIAGNOSIS — S72002D Fracture of unspecified part of neck of left femur, subsequent encounter for closed fracture with routine healing: Secondary | ICD-10-CM | POA: Diagnosis not present

## 2021-06-22 DIAGNOSIS — C3491 Malignant neoplasm of unspecified part of right bronchus or lung: Secondary | ICD-10-CM | POA: Diagnosis not present

## 2021-06-22 DIAGNOSIS — Z9181 History of falling: Secondary | ICD-10-CM | POA: Diagnosis not present

## 2021-06-22 DIAGNOSIS — F17210 Nicotine dependence, cigarettes, uncomplicated: Secondary | ICD-10-CM | POA: Diagnosis not present

## 2021-06-22 DIAGNOSIS — F02811 Dementia in other diseases classified elsewhere, unspecified severity, with agitation: Secondary | ICD-10-CM | POA: Diagnosis not present

## 2021-06-22 DIAGNOSIS — D509 Iron deficiency anemia, unspecified: Secondary | ICD-10-CM | POA: Diagnosis not present

## 2021-06-22 DIAGNOSIS — Z993 Dependence on wheelchair: Secondary | ICD-10-CM | POA: Diagnosis not present

## 2021-06-22 DIAGNOSIS — K219 Gastro-esophageal reflux disease without esophagitis: Secondary | ICD-10-CM | POA: Diagnosis not present

## 2021-06-22 DIAGNOSIS — W19XXXD Unspecified fall, subsequent encounter: Secondary | ICD-10-CM | POA: Diagnosis not present

## 2021-06-22 DIAGNOSIS — G309 Alzheimer's disease, unspecified: Secondary | ICD-10-CM | POA: Diagnosis not present

## 2021-06-22 DIAGNOSIS — I5042 Chronic combined systolic (congestive) and diastolic (congestive) heart failure: Secondary | ICD-10-CM | POA: Diagnosis not present

## 2021-06-22 DIAGNOSIS — N183 Chronic kidney disease, stage 3 unspecified: Secondary | ICD-10-CM | POA: Diagnosis not present

## 2021-06-22 DIAGNOSIS — G4733 Obstructive sleep apnea (adult) (pediatric): Secondary | ICD-10-CM | POA: Diagnosis not present

## 2021-06-22 DIAGNOSIS — J449 Chronic obstructive pulmonary disease, unspecified: Secondary | ICD-10-CM | POA: Diagnosis not present

## 2021-06-22 DIAGNOSIS — E785 Hyperlipidemia, unspecified: Secondary | ICD-10-CM | POA: Diagnosis not present

## 2021-06-22 DIAGNOSIS — E538 Deficiency of other specified B group vitamins: Secondary | ICD-10-CM | POA: Diagnosis not present

## 2021-06-24 DIAGNOSIS — D509 Iron deficiency anemia, unspecified: Secondary | ICD-10-CM | POA: Diagnosis not present

## 2021-06-24 DIAGNOSIS — W19XXXD Unspecified fall, subsequent encounter: Secondary | ICD-10-CM | POA: Diagnosis not present

## 2021-06-24 DIAGNOSIS — N183 Chronic kidney disease, stage 3 unspecified: Secondary | ICD-10-CM | POA: Diagnosis not present

## 2021-06-24 DIAGNOSIS — F17210 Nicotine dependence, cigarettes, uncomplicated: Secondary | ICD-10-CM | POA: Diagnosis not present

## 2021-06-24 DIAGNOSIS — J449 Chronic obstructive pulmonary disease, unspecified: Secondary | ICD-10-CM | POA: Diagnosis not present

## 2021-06-24 DIAGNOSIS — G4733 Obstructive sleep apnea (adult) (pediatric): Secondary | ICD-10-CM | POA: Diagnosis not present

## 2021-06-24 DIAGNOSIS — F02811 Dementia in other diseases classified elsewhere, unspecified severity, with agitation: Secondary | ICD-10-CM | POA: Diagnosis not present

## 2021-06-24 DIAGNOSIS — K219 Gastro-esophageal reflux disease without esophagitis: Secondary | ICD-10-CM | POA: Diagnosis not present

## 2021-06-24 DIAGNOSIS — C3491 Malignant neoplasm of unspecified part of right bronchus or lung: Secondary | ICD-10-CM | POA: Diagnosis not present

## 2021-06-24 DIAGNOSIS — I130 Hypertensive heart and chronic kidney disease with heart failure and stage 1 through stage 4 chronic kidney disease, or unspecified chronic kidney disease: Secondary | ICD-10-CM | POA: Diagnosis not present

## 2021-06-24 DIAGNOSIS — G309 Alzheimer's disease, unspecified: Secondary | ICD-10-CM | POA: Diagnosis not present

## 2021-06-24 DIAGNOSIS — E538 Deficiency of other specified B group vitamins: Secondary | ICD-10-CM | POA: Diagnosis not present

## 2021-06-24 DIAGNOSIS — Z9181 History of falling: Secondary | ICD-10-CM | POA: Diagnosis not present

## 2021-06-24 DIAGNOSIS — I5042 Chronic combined systolic (congestive) and diastolic (congestive) heart failure: Secondary | ICD-10-CM | POA: Diagnosis not present

## 2021-06-24 DIAGNOSIS — S72002D Fracture of unspecified part of neck of left femur, subsequent encounter for closed fracture with routine healing: Secondary | ICD-10-CM | POA: Diagnosis not present

## 2021-06-24 DIAGNOSIS — Z993 Dependence on wheelchair: Secondary | ICD-10-CM | POA: Diagnosis not present

## 2021-06-24 DIAGNOSIS — E785 Hyperlipidemia, unspecified: Secondary | ICD-10-CM | POA: Diagnosis not present

## 2021-06-24 DIAGNOSIS — M4807 Spinal stenosis, lumbosacral region: Secondary | ICD-10-CM | POA: Diagnosis not present

## 2021-06-26 DIAGNOSIS — R262 Difficulty in walking, not elsewhere classified: Secondary | ICD-10-CM | POA: Diagnosis not present

## 2021-07-01 DIAGNOSIS — N183 Chronic kidney disease, stage 3 unspecified: Secondary | ICD-10-CM | POA: Diagnosis not present

## 2021-07-01 DIAGNOSIS — W19XXXD Unspecified fall, subsequent encounter: Secondary | ICD-10-CM | POA: Diagnosis not present

## 2021-07-01 DIAGNOSIS — F02811 Dementia in other diseases classified elsewhere, unspecified severity, with agitation: Secondary | ICD-10-CM | POA: Diagnosis not present

## 2021-07-01 DIAGNOSIS — D509 Iron deficiency anemia, unspecified: Secondary | ICD-10-CM | POA: Diagnosis not present

## 2021-07-01 DIAGNOSIS — E538 Deficiency of other specified B group vitamins: Secondary | ICD-10-CM | POA: Diagnosis not present

## 2021-07-01 DIAGNOSIS — Z9181 History of falling: Secondary | ICD-10-CM | POA: Diagnosis not present

## 2021-07-01 DIAGNOSIS — C3491 Malignant neoplasm of unspecified part of right bronchus or lung: Secondary | ICD-10-CM | POA: Diagnosis not present

## 2021-07-01 DIAGNOSIS — Z993 Dependence on wheelchair: Secondary | ICD-10-CM | POA: Diagnosis not present

## 2021-07-01 DIAGNOSIS — I5042 Chronic combined systolic (congestive) and diastolic (congestive) heart failure: Secondary | ICD-10-CM | POA: Diagnosis not present

## 2021-07-01 DIAGNOSIS — K219 Gastro-esophageal reflux disease without esophagitis: Secondary | ICD-10-CM | POA: Diagnosis not present

## 2021-07-01 DIAGNOSIS — F17210 Nicotine dependence, cigarettes, uncomplicated: Secondary | ICD-10-CM | POA: Diagnosis not present

## 2021-07-01 DIAGNOSIS — J449 Chronic obstructive pulmonary disease, unspecified: Secondary | ICD-10-CM | POA: Diagnosis not present

## 2021-07-01 DIAGNOSIS — G309 Alzheimer's disease, unspecified: Secondary | ICD-10-CM | POA: Diagnosis not present

## 2021-07-01 DIAGNOSIS — S72002D Fracture of unspecified part of neck of left femur, subsequent encounter for closed fracture with routine healing: Secondary | ICD-10-CM | POA: Diagnosis not present

## 2021-07-01 DIAGNOSIS — E785 Hyperlipidemia, unspecified: Secondary | ICD-10-CM | POA: Diagnosis not present

## 2021-07-01 DIAGNOSIS — M4807 Spinal stenosis, lumbosacral region: Secondary | ICD-10-CM | POA: Diagnosis not present

## 2021-07-01 DIAGNOSIS — I130 Hypertensive heart and chronic kidney disease with heart failure and stage 1 through stage 4 chronic kidney disease, or unspecified chronic kidney disease: Secondary | ICD-10-CM | POA: Diagnosis not present

## 2021-07-01 DIAGNOSIS — G4733 Obstructive sleep apnea (adult) (pediatric): Secondary | ICD-10-CM | POA: Diagnosis not present

## 2021-07-07 DIAGNOSIS — G309 Alzheimer's disease, unspecified: Secondary | ICD-10-CM | POA: Diagnosis not present

## 2021-07-07 DIAGNOSIS — E785 Hyperlipidemia, unspecified: Secondary | ICD-10-CM | POA: Diagnosis not present

## 2021-07-07 DIAGNOSIS — J449 Chronic obstructive pulmonary disease, unspecified: Secondary | ICD-10-CM | POA: Diagnosis not present

## 2021-07-07 DIAGNOSIS — C3491 Malignant neoplasm of unspecified part of right bronchus or lung: Secondary | ICD-10-CM | POA: Diagnosis not present

## 2021-07-07 DIAGNOSIS — S72002D Fracture of unspecified part of neck of left femur, subsequent encounter for closed fracture with routine healing: Secondary | ICD-10-CM | POA: Diagnosis not present

## 2021-07-07 DIAGNOSIS — W19XXXD Unspecified fall, subsequent encounter: Secondary | ICD-10-CM | POA: Diagnosis not present

## 2021-07-07 DIAGNOSIS — I130 Hypertensive heart and chronic kidney disease with heart failure and stage 1 through stage 4 chronic kidney disease, or unspecified chronic kidney disease: Secondary | ICD-10-CM | POA: Diagnosis not present

## 2021-07-07 DIAGNOSIS — N183 Chronic kidney disease, stage 3 unspecified: Secondary | ICD-10-CM | POA: Diagnosis not present

## 2021-07-07 DIAGNOSIS — Z993 Dependence on wheelchair: Secondary | ICD-10-CM | POA: Diagnosis not present

## 2021-07-07 DIAGNOSIS — K219 Gastro-esophageal reflux disease without esophagitis: Secondary | ICD-10-CM | POA: Diagnosis not present

## 2021-07-07 DIAGNOSIS — I5042 Chronic combined systolic (congestive) and diastolic (congestive) heart failure: Secondary | ICD-10-CM | POA: Diagnosis not present

## 2021-07-07 DIAGNOSIS — M4807 Spinal stenosis, lumbosacral region: Secondary | ICD-10-CM | POA: Diagnosis not present

## 2021-07-07 DIAGNOSIS — Z9181 History of falling: Secondary | ICD-10-CM | POA: Diagnosis not present

## 2021-07-07 DIAGNOSIS — F02811 Dementia in other diseases classified elsewhere, unspecified severity, with agitation: Secondary | ICD-10-CM | POA: Diagnosis not present

## 2021-07-07 DIAGNOSIS — G4733 Obstructive sleep apnea (adult) (pediatric): Secondary | ICD-10-CM | POA: Diagnosis not present

## 2021-07-07 DIAGNOSIS — E538 Deficiency of other specified B group vitamins: Secondary | ICD-10-CM | POA: Diagnosis not present

## 2021-07-07 DIAGNOSIS — F17210 Nicotine dependence, cigarettes, uncomplicated: Secondary | ICD-10-CM | POA: Diagnosis not present

## 2021-07-07 DIAGNOSIS — D509 Iron deficiency anemia, unspecified: Secondary | ICD-10-CM | POA: Diagnosis not present

## 2021-07-11 DIAGNOSIS — R2681 Unsteadiness on feet: Secondary | ICD-10-CM | POA: Diagnosis not present

## 2021-07-11 DIAGNOSIS — D631 Anemia in chronic kidney disease: Secondary | ICD-10-CM | POA: Diagnosis not present

## 2021-07-11 DIAGNOSIS — Z7982 Long term (current) use of aspirin: Secondary | ICD-10-CM | POA: Diagnosis not present

## 2021-07-11 DIAGNOSIS — I130 Hypertensive heart and chronic kidney disease with heart failure and stage 1 through stage 4 chronic kidney disease, or unspecified chronic kidney disease: Secondary | ICD-10-CM | POA: Diagnosis not present

## 2021-07-13 DIAGNOSIS — I130 Hypertensive heart and chronic kidney disease with heart failure and stage 1 through stage 4 chronic kidney disease, or unspecified chronic kidney disease: Secondary | ICD-10-CM | POA: Diagnosis not present

## 2021-07-13 DIAGNOSIS — Z9181 History of falling: Secondary | ICD-10-CM | POA: Diagnosis not present

## 2021-07-13 DIAGNOSIS — D509 Iron deficiency anemia, unspecified: Secondary | ICD-10-CM | POA: Diagnosis not present

## 2021-07-13 DIAGNOSIS — Z993 Dependence on wheelchair: Secondary | ICD-10-CM | POA: Diagnosis not present

## 2021-07-13 DIAGNOSIS — J449 Chronic obstructive pulmonary disease, unspecified: Secondary | ICD-10-CM | POA: Diagnosis not present

## 2021-07-13 DIAGNOSIS — F17210 Nicotine dependence, cigarettes, uncomplicated: Secondary | ICD-10-CM | POA: Diagnosis not present

## 2021-07-13 DIAGNOSIS — G4733 Obstructive sleep apnea (adult) (pediatric): Secondary | ICD-10-CM | POA: Diagnosis not present

## 2021-07-13 DIAGNOSIS — I5042 Chronic combined systolic (congestive) and diastolic (congestive) heart failure: Secondary | ICD-10-CM | POA: Diagnosis not present

## 2021-07-13 DIAGNOSIS — E785 Hyperlipidemia, unspecified: Secondary | ICD-10-CM | POA: Diagnosis not present

## 2021-07-13 DIAGNOSIS — K219 Gastro-esophageal reflux disease without esophagitis: Secondary | ICD-10-CM | POA: Diagnosis not present

## 2021-07-13 DIAGNOSIS — C3491 Malignant neoplasm of unspecified part of right bronchus or lung: Secondary | ICD-10-CM | POA: Diagnosis not present

## 2021-07-13 DIAGNOSIS — G309 Alzheimer's disease, unspecified: Secondary | ICD-10-CM | POA: Diagnosis not present

## 2021-07-13 DIAGNOSIS — E538 Deficiency of other specified B group vitamins: Secondary | ICD-10-CM | POA: Diagnosis not present

## 2021-07-13 DIAGNOSIS — W19XXXD Unspecified fall, subsequent encounter: Secondary | ICD-10-CM | POA: Diagnosis not present

## 2021-07-13 DIAGNOSIS — M4807 Spinal stenosis, lumbosacral region: Secondary | ICD-10-CM | POA: Diagnosis not present

## 2021-07-13 DIAGNOSIS — N183 Chronic kidney disease, stage 3 unspecified: Secondary | ICD-10-CM | POA: Diagnosis not present

## 2021-07-13 DIAGNOSIS — F02811 Dementia in other diseases classified elsewhere, unspecified severity, with agitation: Secondary | ICD-10-CM | POA: Diagnosis not present

## 2021-07-13 DIAGNOSIS — S72002D Fracture of unspecified part of neck of left femur, subsequent encounter for closed fracture with routine healing: Secondary | ICD-10-CM | POA: Diagnosis not present

## 2021-07-20 DIAGNOSIS — I5042 Chronic combined systolic (congestive) and diastolic (congestive) heart failure: Secondary | ICD-10-CM | POA: Diagnosis not present

## 2021-07-20 DIAGNOSIS — F02811 Dementia in other diseases classified elsewhere, unspecified severity, with agitation: Secondary | ICD-10-CM | POA: Diagnosis not present

## 2021-07-20 DIAGNOSIS — E785 Hyperlipidemia, unspecified: Secondary | ICD-10-CM | POA: Diagnosis not present

## 2021-07-20 DIAGNOSIS — C3491 Malignant neoplasm of unspecified part of right bronchus or lung: Secondary | ICD-10-CM | POA: Diagnosis not present

## 2021-07-20 DIAGNOSIS — I130 Hypertensive heart and chronic kidney disease with heart failure and stage 1 through stage 4 chronic kidney disease, or unspecified chronic kidney disease: Secondary | ICD-10-CM | POA: Diagnosis not present

## 2021-07-20 DIAGNOSIS — G309 Alzheimer's disease, unspecified: Secondary | ICD-10-CM | POA: Diagnosis not present

## 2021-07-20 DIAGNOSIS — E538 Deficiency of other specified B group vitamins: Secondary | ICD-10-CM | POA: Diagnosis not present

## 2021-07-20 DIAGNOSIS — M4807 Spinal stenosis, lumbosacral region: Secondary | ICD-10-CM | POA: Diagnosis not present

## 2021-07-20 DIAGNOSIS — Z993 Dependence on wheelchair: Secondary | ICD-10-CM | POA: Diagnosis not present

## 2021-07-20 DIAGNOSIS — N183 Chronic kidney disease, stage 3 unspecified: Secondary | ICD-10-CM | POA: Diagnosis not present

## 2021-07-20 DIAGNOSIS — G4733 Obstructive sleep apnea (adult) (pediatric): Secondary | ICD-10-CM | POA: Diagnosis not present

## 2021-07-20 DIAGNOSIS — S72002D Fracture of unspecified part of neck of left femur, subsequent encounter for closed fracture with routine healing: Secondary | ICD-10-CM | POA: Diagnosis not present

## 2021-07-20 DIAGNOSIS — F17210 Nicotine dependence, cigarettes, uncomplicated: Secondary | ICD-10-CM | POA: Diagnosis not present

## 2021-07-20 DIAGNOSIS — D509 Iron deficiency anemia, unspecified: Secondary | ICD-10-CM | POA: Diagnosis not present

## 2021-07-20 DIAGNOSIS — J449 Chronic obstructive pulmonary disease, unspecified: Secondary | ICD-10-CM | POA: Diagnosis not present

## 2021-07-20 DIAGNOSIS — Z9181 History of falling: Secondary | ICD-10-CM | POA: Diagnosis not present

## 2021-07-20 DIAGNOSIS — K219 Gastro-esophageal reflux disease without esophagitis: Secondary | ICD-10-CM | POA: Diagnosis not present

## 2021-07-20 DIAGNOSIS — W19XXXD Unspecified fall, subsequent encounter: Secondary | ICD-10-CM | POA: Diagnosis not present

## 2021-07-24 DIAGNOSIS — R262 Difficulty in walking, not elsewhere classified: Secondary | ICD-10-CM | POA: Diagnosis not present

## 2021-07-25 DIAGNOSIS — Z993 Dependence on wheelchair: Secondary | ICD-10-CM | POA: Diagnosis not present

## 2021-07-25 DIAGNOSIS — J449 Chronic obstructive pulmonary disease, unspecified: Secondary | ICD-10-CM | POA: Diagnosis not present

## 2021-07-25 DIAGNOSIS — I130 Hypertensive heart and chronic kidney disease with heart failure and stage 1 through stage 4 chronic kidney disease, or unspecified chronic kidney disease: Secondary | ICD-10-CM | POA: Diagnosis not present

## 2021-07-25 DIAGNOSIS — C3491 Malignant neoplasm of unspecified part of right bronchus or lung: Secondary | ICD-10-CM | POA: Diagnosis not present

## 2021-07-25 DIAGNOSIS — W19XXXD Unspecified fall, subsequent encounter: Secondary | ICD-10-CM | POA: Diagnosis not present

## 2021-07-25 DIAGNOSIS — D509 Iron deficiency anemia, unspecified: Secondary | ICD-10-CM | POA: Diagnosis not present

## 2021-07-25 DIAGNOSIS — N183 Chronic kidney disease, stage 3 unspecified: Secondary | ICD-10-CM | POA: Diagnosis not present

## 2021-07-25 DIAGNOSIS — M4807 Spinal stenosis, lumbosacral region: Secondary | ICD-10-CM | POA: Diagnosis not present

## 2021-07-25 DIAGNOSIS — E538 Deficiency of other specified B group vitamins: Secondary | ICD-10-CM | POA: Diagnosis not present

## 2021-07-25 DIAGNOSIS — F17210 Nicotine dependence, cigarettes, uncomplicated: Secondary | ICD-10-CM | POA: Diagnosis not present

## 2021-07-25 DIAGNOSIS — G309 Alzheimer's disease, unspecified: Secondary | ICD-10-CM | POA: Diagnosis not present

## 2021-07-25 DIAGNOSIS — K219 Gastro-esophageal reflux disease without esophagitis: Secondary | ICD-10-CM | POA: Diagnosis not present

## 2021-07-25 DIAGNOSIS — G4733 Obstructive sleep apnea (adult) (pediatric): Secondary | ICD-10-CM | POA: Diagnosis not present

## 2021-07-25 DIAGNOSIS — I5042 Chronic combined systolic (congestive) and diastolic (congestive) heart failure: Secondary | ICD-10-CM | POA: Diagnosis not present

## 2021-07-25 DIAGNOSIS — S72002D Fracture of unspecified part of neck of left femur, subsequent encounter for closed fracture with routine healing: Secondary | ICD-10-CM | POA: Diagnosis not present

## 2021-07-25 DIAGNOSIS — Z9181 History of falling: Secondary | ICD-10-CM | POA: Diagnosis not present

## 2021-07-25 DIAGNOSIS — F02811 Dementia in other diseases classified elsewhere, unspecified severity, with agitation: Secondary | ICD-10-CM | POA: Diagnosis not present

## 2021-07-25 DIAGNOSIS — E785 Hyperlipidemia, unspecified: Secondary | ICD-10-CM | POA: Diagnosis not present

## 2021-07-28 DIAGNOSIS — Z9181 History of falling: Secondary | ICD-10-CM | POA: Diagnosis not present

## 2021-07-28 DIAGNOSIS — M4807 Spinal stenosis, lumbosacral region: Secondary | ICD-10-CM | POA: Diagnosis not present

## 2021-07-28 DIAGNOSIS — G309 Alzheimer's disease, unspecified: Secondary | ICD-10-CM | POA: Diagnosis not present

## 2021-07-28 DIAGNOSIS — J449 Chronic obstructive pulmonary disease, unspecified: Secondary | ICD-10-CM | POA: Diagnosis not present

## 2021-07-28 DIAGNOSIS — C3491 Malignant neoplasm of unspecified part of right bronchus or lung: Secondary | ICD-10-CM | POA: Diagnosis not present

## 2021-07-28 DIAGNOSIS — N183 Chronic kidney disease, stage 3 unspecified: Secondary | ICD-10-CM | POA: Diagnosis not present

## 2021-07-28 DIAGNOSIS — E538 Deficiency of other specified B group vitamins: Secondary | ICD-10-CM | POA: Diagnosis not present

## 2021-07-28 DIAGNOSIS — F17210 Nicotine dependence, cigarettes, uncomplicated: Secondary | ICD-10-CM | POA: Diagnosis not present

## 2021-07-28 DIAGNOSIS — I130 Hypertensive heart and chronic kidney disease with heart failure and stage 1 through stage 4 chronic kidney disease, or unspecified chronic kidney disease: Secondary | ICD-10-CM | POA: Diagnosis not present

## 2021-07-28 DIAGNOSIS — D509 Iron deficiency anemia, unspecified: Secondary | ICD-10-CM | POA: Diagnosis not present

## 2021-07-28 DIAGNOSIS — G4733 Obstructive sleep apnea (adult) (pediatric): Secondary | ICD-10-CM | POA: Diagnosis not present

## 2021-07-28 DIAGNOSIS — F02811 Dementia in other diseases classified elsewhere, unspecified severity, with agitation: Secondary | ICD-10-CM | POA: Diagnosis not present

## 2021-07-28 DIAGNOSIS — I5042 Chronic combined systolic (congestive) and diastolic (congestive) heart failure: Secondary | ICD-10-CM | POA: Diagnosis not present

## 2021-07-28 DIAGNOSIS — S72002D Fracture of unspecified part of neck of left femur, subsequent encounter for closed fracture with routine healing: Secondary | ICD-10-CM | POA: Diagnosis not present

## 2021-07-28 DIAGNOSIS — W19XXXD Unspecified fall, subsequent encounter: Secondary | ICD-10-CM | POA: Diagnosis not present

## 2021-07-28 DIAGNOSIS — K219 Gastro-esophageal reflux disease without esophagitis: Secondary | ICD-10-CM | POA: Diagnosis not present

## 2021-07-28 DIAGNOSIS — Z993 Dependence on wheelchair: Secondary | ICD-10-CM | POA: Diagnosis not present

## 2021-07-28 DIAGNOSIS — E785 Hyperlipidemia, unspecified: Secondary | ICD-10-CM | POA: Diagnosis not present

## 2021-08-02 DIAGNOSIS — I130 Hypertensive heart and chronic kidney disease with heart failure and stage 1 through stage 4 chronic kidney disease, or unspecified chronic kidney disease: Secondary | ICD-10-CM | POA: Diagnosis not present

## 2021-08-02 DIAGNOSIS — N183 Chronic kidney disease, stage 3 unspecified: Secondary | ICD-10-CM | POA: Diagnosis not present

## 2021-08-02 DIAGNOSIS — G4733 Obstructive sleep apnea (adult) (pediatric): Secondary | ICD-10-CM | POA: Diagnosis not present

## 2021-08-02 DIAGNOSIS — Z9181 History of falling: Secondary | ICD-10-CM | POA: Diagnosis not present

## 2021-08-02 DIAGNOSIS — F17210 Nicotine dependence, cigarettes, uncomplicated: Secondary | ICD-10-CM | POA: Diagnosis not present

## 2021-08-02 DIAGNOSIS — S72002D Fracture of unspecified part of neck of left femur, subsequent encounter for closed fracture with routine healing: Secondary | ICD-10-CM | POA: Diagnosis not present

## 2021-08-02 DIAGNOSIS — E785 Hyperlipidemia, unspecified: Secondary | ICD-10-CM | POA: Diagnosis not present

## 2021-08-02 DIAGNOSIS — F02811 Dementia in other diseases classified elsewhere, unspecified severity, with agitation: Secondary | ICD-10-CM | POA: Diagnosis not present

## 2021-08-02 DIAGNOSIS — E538 Deficiency of other specified B group vitamins: Secondary | ICD-10-CM | POA: Diagnosis not present

## 2021-08-02 DIAGNOSIS — M4807 Spinal stenosis, lumbosacral region: Secondary | ICD-10-CM | POA: Diagnosis not present

## 2021-08-02 DIAGNOSIS — G309 Alzheimer's disease, unspecified: Secondary | ICD-10-CM | POA: Diagnosis not present

## 2021-08-02 DIAGNOSIS — W19XXXD Unspecified fall, subsequent encounter: Secondary | ICD-10-CM | POA: Diagnosis not present

## 2021-08-02 DIAGNOSIS — D509 Iron deficiency anemia, unspecified: Secondary | ICD-10-CM | POA: Diagnosis not present

## 2021-08-02 DIAGNOSIS — Z993 Dependence on wheelchair: Secondary | ICD-10-CM | POA: Diagnosis not present

## 2021-08-02 DIAGNOSIS — I5042 Chronic combined systolic (congestive) and diastolic (congestive) heart failure: Secondary | ICD-10-CM | POA: Diagnosis not present

## 2021-08-02 DIAGNOSIS — K219 Gastro-esophageal reflux disease without esophagitis: Secondary | ICD-10-CM | POA: Diagnosis not present

## 2021-08-02 DIAGNOSIS — J449 Chronic obstructive pulmonary disease, unspecified: Secondary | ICD-10-CM | POA: Diagnosis not present

## 2021-08-02 DIAGNOSIS — C3491 Malignant neoplasm of unspecified part of right bronchus or lung: Secondary | ICD-10-CM | POA: Diagnosis not present

## 2021-08-04 DIAGNOSIS — R58 Hemorrhage, not elsewhere classified: Secondary | ICD-10-CM | POA: Diagnosis not present

## 2021-08-04 DIAGNOSIS — Z743 Need for continuous supervision: Secondary | ICD-10-CM | POA: Diagnosis not present

## 2021-08-04 DIAGNOSIS — R404 Transient alteration of awareness: Secondary | ICD-10-CM | POA: Diagnosis not present

## 2021-08-04 DIAGNOSIS — I499 Cardiac arrhythmia, unspecified: Secondary | ICD-10-CM | POA: Diagnosis not present

## 2021-08-13 DIAGNOSIS — 419620001 Death: Secondary | SNOMED CT | POA: Diagnosis not present

## 2021-08-13 DEATH — deceased

## 2021-11-01 ENCOUNTER — Ambulatory Visit: Payer: Medicare Other | Admitting: Internal Medicine

## 2021-11-01 ENCOUNTER — Other Ambulatory Visit: Payer: Medicare Other

## 2021-12-20 ENCOUNTER — Encounter: Payer: Medicare Other | Admitting: Nurse Practitioner

## 2022-01-25 ENCOUNTER — Ambulatory Visit: Payer: Medicare Other | Admitting: Nurse Practitioner

## 2022-01-25 ENCOUNTER — Ambulatory Visit: Payer: Medicare Other

## 2022-10-12 IMAGING — MR MR HEAD W/O CM
12 of 13 series · 44 of 48 positions shown · non-contrast
Comparison: 06/21/2020

CLINICAL DATA: Acute neurologic deficit

EXAM:
MRI HEAD WITHOUT CONTRAST
TECHNIQUE: Multiplanar, multiecho pulse sequences of the brain and surrounding
structures were obtained without intravenous contrast.

[Series 5: DWI · axial · 3.0mm · 0.88mm/px · z∈[-118,+20]mm · 9 of 96 slices shown (1 of 4)]
[im 1/96]
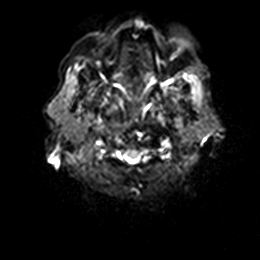
[im 12/96]
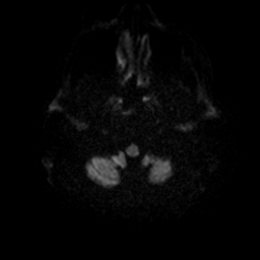
[im 24/96]
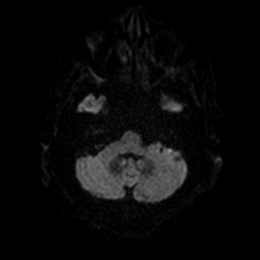
[im 36/96]
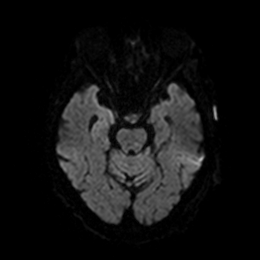
[im 48/96]
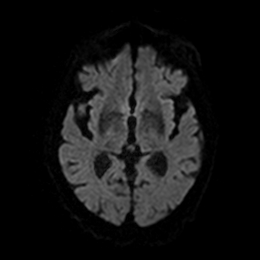
[im 60/96]
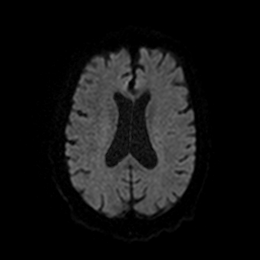
[im 72/96]
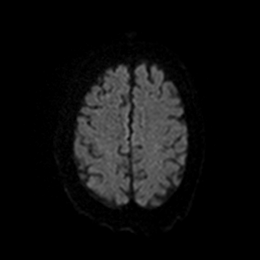
[im 84/96]
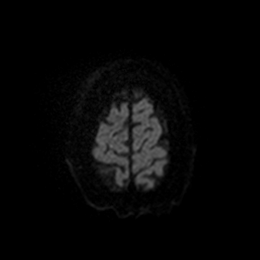
[im 96/96]
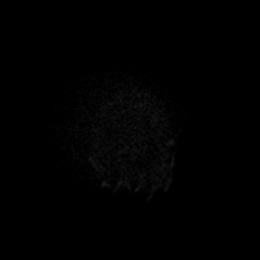

[Series 6: DWI · axial · 3.0mm · 0.88mm/px · z∈[-118,+20]mm · 4 of 48 slices shown (2 of 4)]
[im 1/48]
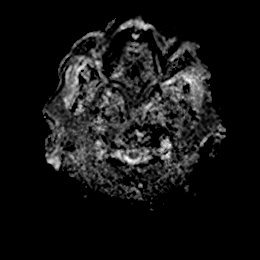
[im 16/48]
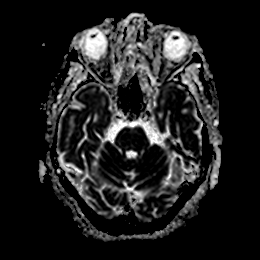
[im 32/48]
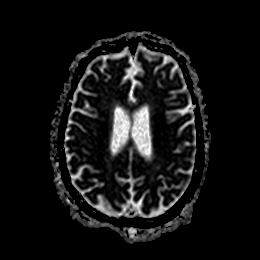
[im 48/48]
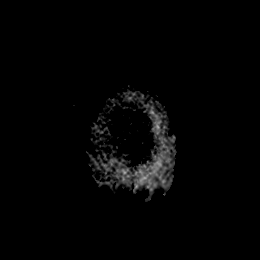

[Series 7: DWI · coronal · 4.0mm · 0.88mm/px · 5 of 64 slices shown (3 of 4)]
[im 1/64]
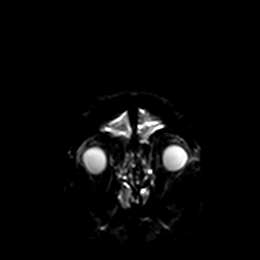
[im 16/64]
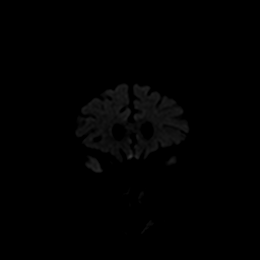
[im 32/64]
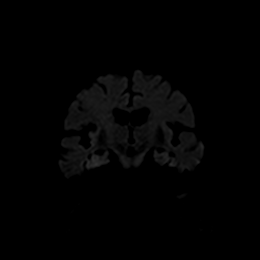
[im 48/64]
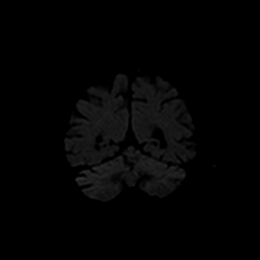
[im 64/64]
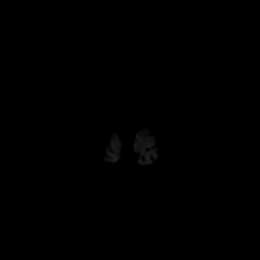

[Series 8: DWI · coronal · 4.0mm · 0.88mm/px · 3 of 32 slices shown (4 of 4)]
[im 1/32]
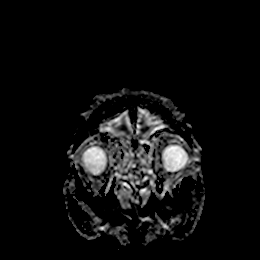
[im 16/32]
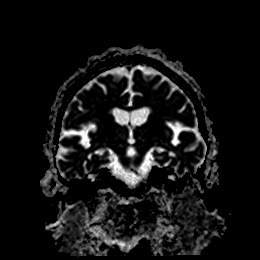
[im 32/32]
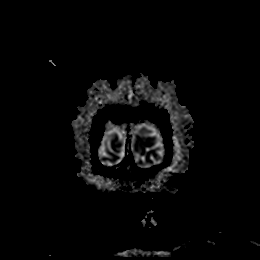

[Series 9: T1 · sagittal · 5.0mm · 0.75mm/px · 2 of 23 slices shown]
[im 1/23]
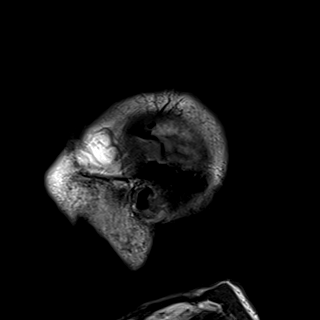
[im 23/23]
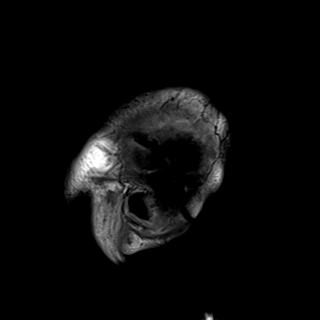

[Series 10: T2 · axial · 5.0mm · 0.72mm/px · z∈[-116,+18]mm · 2 of 24 slices shown (1 of 2)]
[im 1/24]
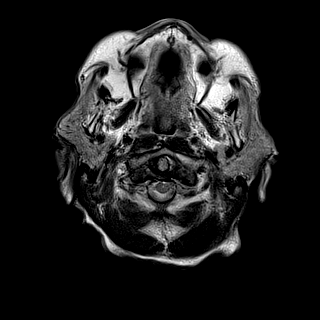
[im 24/24]
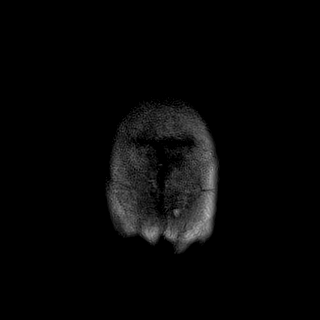

[Series 11: FLAIR · axial · 5.0mm · 0.45mm/px · z∈[-120,+14]mm · 2 of 24 slices shown]
[im 1/24]
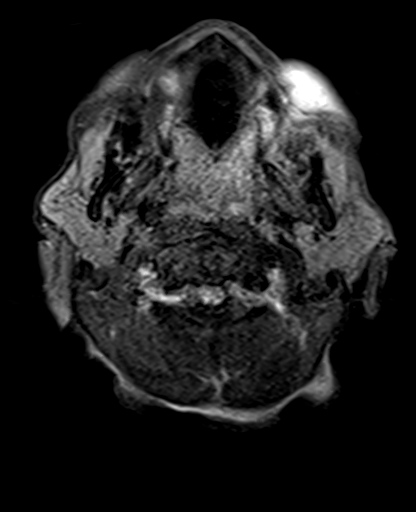
[im 24/24]
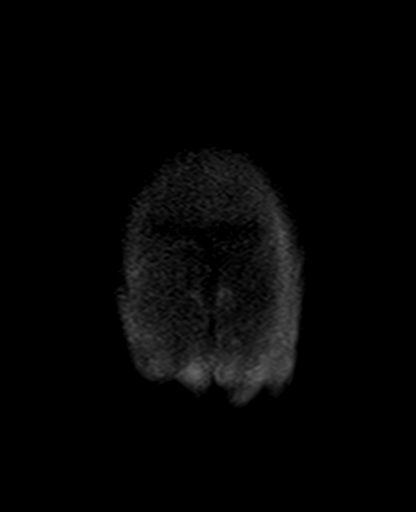

[Series 12: mag_images · axial · 3.0mm · 0.90mm/px · z∈[-122,+15]mm · 4 of 48 slices shown]
[im 1/48]
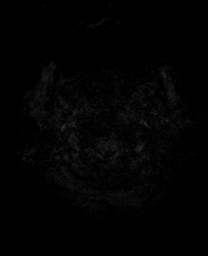
[im 16/48]
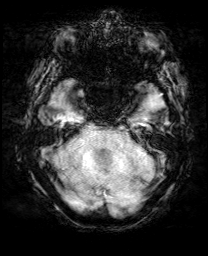
[im 32/48]
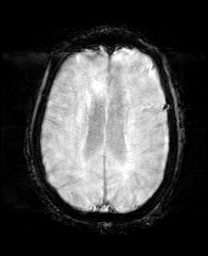
[im 48/48]
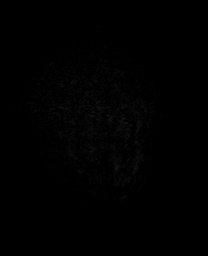

[Series 13: pha_images · axial · 3.0mm · 0.90mm/px · z∈[-122,+10]mm · 4 of 46 slices shown]
[im 1/46]
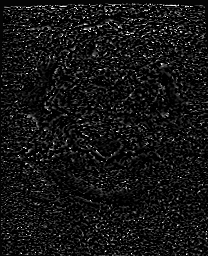
[im 16/46]
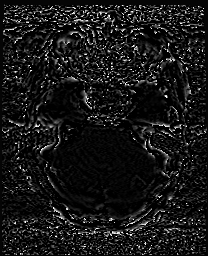
[im 31/46]
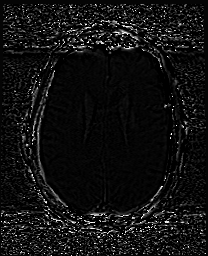
[im 46/46]
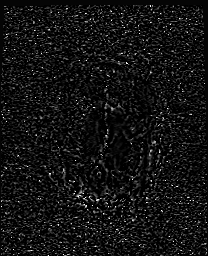

[Series 14: swi_images · axial · 3.0mm · 0.90mm/px · z∈[-122,+15]mm · 4 of 48 slices shown]
[im 1/48]
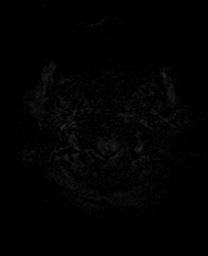
[im 16/48]
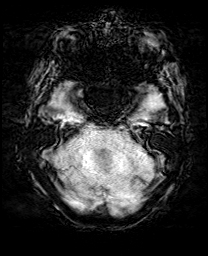
[im 32/48]
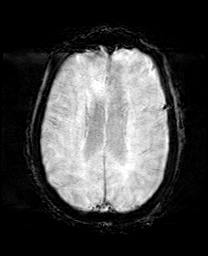
[im 48/48]
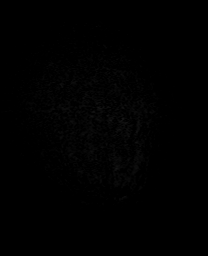

[Series 15: mip_images(sw) · axial · 24.0mm · 0.90mm/px · z∈[-112,+5]mm · 3 of 41 slices shown]
[im 1/41]
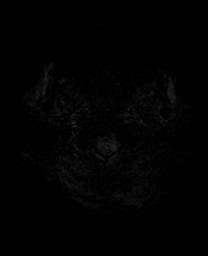
[im 21/41]
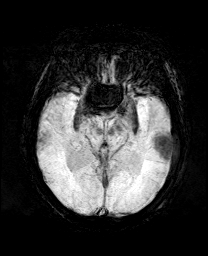
[im 41/41]
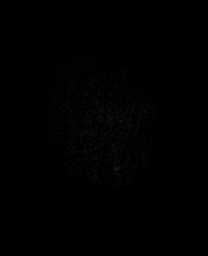

[Series 18: T2 · coronal · 5.0mm · 0.34mm/px · 2 of 26 slices shown (2 of 2)]
[im 1/26]
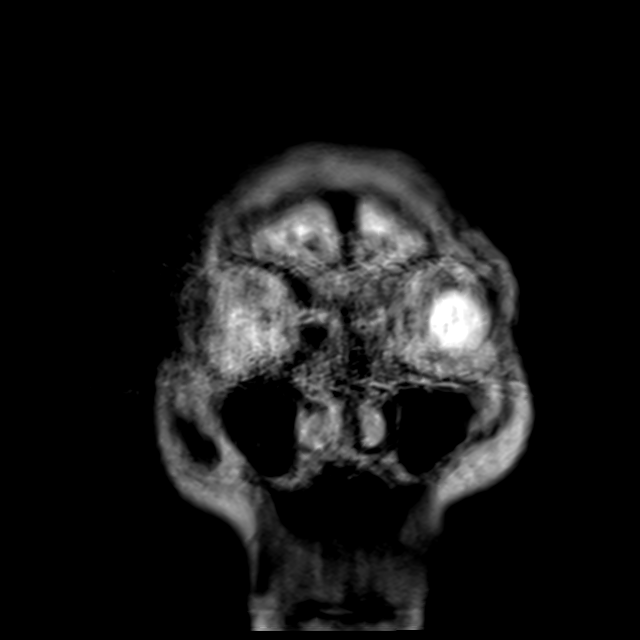
[im 26/26]
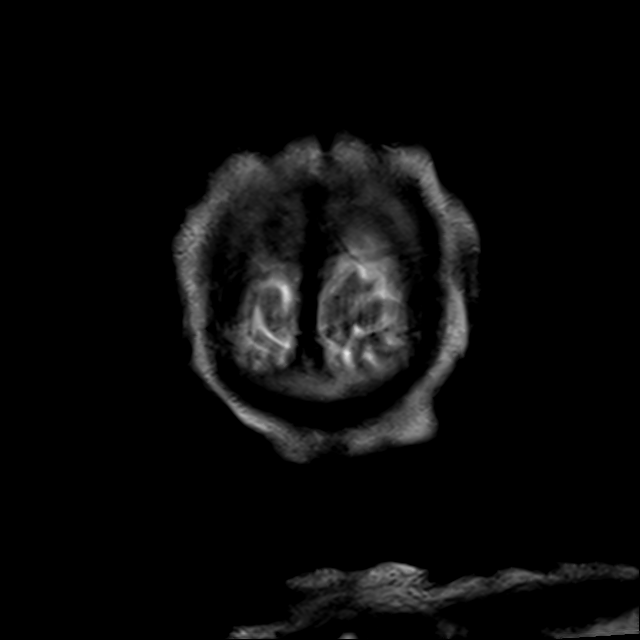

[44 of 48 positions shown; findings below may reference images not displayed]

FINDINGS: Brain: No acute infarct, mass effect or extra-axial collection. No
acute or chronic hemorrhage. There is multifocal hyperintense
T2-weighted signal within the white matter. Generalized volume loss
without a clear lobar predilection. The midline structures are
normal.

Vascular: Major flow voids are preserved.

Skull and upper cervical spine: Normal calvarium and skull base.
Visualized upper cervical spine and soft tissues are normal.

Sinuses/Orbits:No paranasal sinus fluid levels or advanced mucosal
thickening. No mastoid or middle ear effusion. Normal orbits.
IMPRESSION: 1. No acute intracranial abnormality.
2. Findings of chronic small vessel ischemia and volume loss.
